# Patient Record
Sex: Female | Born: 1945 | Race: White | Hispanic: No | Marital: Married | Smoking: Never smoker
Health system: Southern US, Community
[De-identification: ages and names within clinical notes are randomized; demographics above are authoritative.]

## PROBLEM LIST (undated history)

## (undated) DIAGNOSIS — F1190 Opioid use, unspecified, uncomplicated: Secondary | ICD-10-CM

## (undated) DIAGNOSIS — E119 Type 2 diabetes mellitus without complications: Secondary | ICD-10-CM

## (undated) DIAGNOSIS — R413 Other amnesia: Secondary | ICD-10-CM

## (undated) DIAGNOSIS — I2510 Atherosclerotic heart disease of native coronary artery without angina pectoris: Secondary | ICD-10-CM

## (undated) DIAGNOSIS — I480 Paroxysmal atrial fibrillation: Secondary | ICD-10-CM

## (undated) DIAGNOSIS — I517 Cardiomegaly: Secondary | ICD-10-CM

## (undated) DIAGNOSIS — G4700 Insomnia, unspecified: Secondary | ICD-10-CM

## (undated) DIAGNOSIS — M151 Heberden's nodes (with arthropathy): Secondary | ICD-10-CM

## (undated) DIAGNOSIS — I700 Atherosclerosis of aorta: Secondary | ICD-10-CM

## (undated) DIAGNOSIS — K429 Umbilical hernia without obstruction or gangrene: Secondary | ICD-10-CM

## (undated) DIAGNOSIS — F329 Major depressive disorder, single episode, unspecified: Secondary | ICD-10-CM

## (undated) DIAGNOSIS — Z79631 Long term (current) use of antimetabolite agent: Secondary | ICD-10-CM

## (undated) DIAGNOSIS — N281 Cyst of kidney, acquired: Secondary | ICD-10-CM

## (undated) DIAGNOSIS — F32A Depression, unspecified: Secondary | ICD-10-CM

## (undated) DIAGNOSIS — M069 Rheumatoid arthritis, unspecified: Secondary | ICD-10-CM

## (undated) DIAGNOSIS — R32 Unspecified urinary incontinence: Secondary | ICD-10-CM

## (undated) DIAGNOSIS — Z9841 Cataract extraction status, right eye: Secondary | ICD-10-CM

## (undated) DIAGNOSIS — I10 Essential (primary) hypertension: Secondary | ICD-10-CM

## (undated) DIAGNOSIS — T7840XA Allergy, unspecified, initial encounter: Secondary | ICD-10-CM

## (undated) DIAGNOSIS — F325 Major depressive disorder, single episode, in full remission: Secondary | ICD-10-CM

## (undated) DIAGNOSIS — E039 Hypothyroidism, unspecified: Secondary | ICD-10-CM

## (undated) DIAGNOSIS — M48062 Spinal stenosis, lumbar region with neurogenic claudication: Secondary | ICD-10-CM

## (undated) DIAGNOSIS — A4902 Methicillin resistant Staphylococcus aureus infection, unspecified site: Secondary | ICD-10-CM

## (undated) DIAGNOSIS — Z79620 Long term (current) use of immunosuppressive biologic: Secondary | ICD-10-CM

## (undated) DIAGNOSIS — M51369 Other intervertebral disc degeneration, lumbar region without mention of lumbar back pain or lower extremity pain: Secondary | ICD-10-CM

## (undated) DIAGNOSIS — E782 Mixed hyperlipidemia: Secondary | ICD-10-CM

## (undated) DIAGNOSIS — J189 Pneumonia, unspecified organism: Secondary | ICD-10-CM

## (undated) DIAGNOSIS — K219 Gastro-esophageal reflux disease without esophagitis: Secondary | ICD-10-CM

## (undated) DIAGNOSIS — E559 Vitamin D deficiency, unspecified: Secondary | ICD-10-CM

## (undated) DIAGNOSIS — N183 Chronic kidney disease, stage 3 unspecified: Secondary | ICD-10-CM

## (undated) DIAGNOSIS — G4733 Obstructive sleep apnea (adult) (pediatric): Secondary | ICD-10-CM

## (undated) DIAGNOSIS — M1990 Unspecified osteoarthritis, unspecified site: Secondary | ICD-10-CM

## (undated) DIAGNOSIS — F419 Anxiety disorder, unspecified: Secondary | ICD-10-CM

## (undated) DIAGNOSIS — I272 Pulmonary hypertension, unspecified: Secondary | ICD-10-CM

## (undated) DIAGNOSIS — Z79899 Other long term (current) drug therapy: Secondary | ICD-10-CM

## (undated) DIAGNOSIS — I5020 Unspecified systolic (congestive) heart failure: Secondary | ICD-10-CM

## (undated) DIAGNOSIS — J45909 Unspecified asthma, uncomplicated: Secondary | ICD-10-CM

## (undated) DIAGNOSIS — Z7982 Long term (current) use of aspirin: Secondary | ICD-10-CM

## (undated) DIAGNOSIS — N189 Chronic kidney disease, unspecified: Secondary | ICD-10-CM

## (undated) DIAGNOSIS — E079 Disorder of thyroid, unspecified: Secondary | ICD-10-CM

## (undated) DIAGNOSIS — G4730 Sleep apnea, unspecified: Secondary | ICD-10-CM

## (undated) DIAGNOSIS — R569 Unspecified convulsions: Secondary | ICD-10-CM

## (undated) HISTORY — PX: HIP SURGERY: SHX245

## (undated) HISTORY — PX: ABDOMINAL HYSTERECTOMY: SHX81

## (undated) HISTORY — DX: Unspecified osteoarthritis, unspecified site: M19.90

## (undated) HISTORY — PX: CATARACT EXTRACTION W/ INTRAOCULAR LENS  IMPLANT, BILATERAL: SHX1307

## (undated) HISTORY — DX: Rheumatoid arthritis, unspecified: M06.9

## (undated) HISTORY — PX: DIAGNOSTIC LAPAROSCOPY: SUR761

## (undated) HISTORY — DX: Spinal stenosis, lumbar region with neurogenic claudication: M48.062

## (undated) HISTORY — DX: Sleep apnea, unspecified: G47.30

## (undated) HISTORY — DX: Other amnesia: R41.3

## (undated) HISTORY — DX: Major depressive disorder, single episode, in full remission: F32.5

## (undated) HISTORY — DX: Anxiety disorder, unspecified: F41.9

## (undated) HISTORY — DX: Unspecified convulsions: R56.9

## (undated) HISTORY — PX: HIP FRACTURE SURGERY: SHX118

## (undated) HISTORY — DX: Unspecified asthma, uncomplicated: J45.909

## (undated) HISTORY — DX: Allergy, unspecified, initial encounter: T78.40XA

## (undated) HISTORY — PX: REPLACEMENT TOTAL KNEE: SUR1224

## (undated) HISTORY — DX: Chronic kidney disease, unspecified: N18.9

## (undated) HISTORY — DX: Disorder of thyroid, unspecified: E07.9

## (undated) HISTORY — PX: TOTAL KNEE ARTHROPLASTY: SHX125

## (undated) HISTORY — DX: Major depressive disorder, single episode, unspecified: F32.9

## (undated) HISTORY — DX: Depression, unspecified: F32.A

---

## 2011-06-23 ENCOUNTER — Ambulatory Visit: Payer: Self-pay | Admitting: Family Medicine

## 2011-06-23 IMAGING — MG MM CAD SCREENING MAMMO
1 series · 4 of 4 positions shown · non-contrast
Comparison: none

REASON FOR EXAM: SCR MAMMO NO ORDER
COMMENTS:

[Series 8302: R CC · right · 4 of 4 slices shown]
[im 1/4]
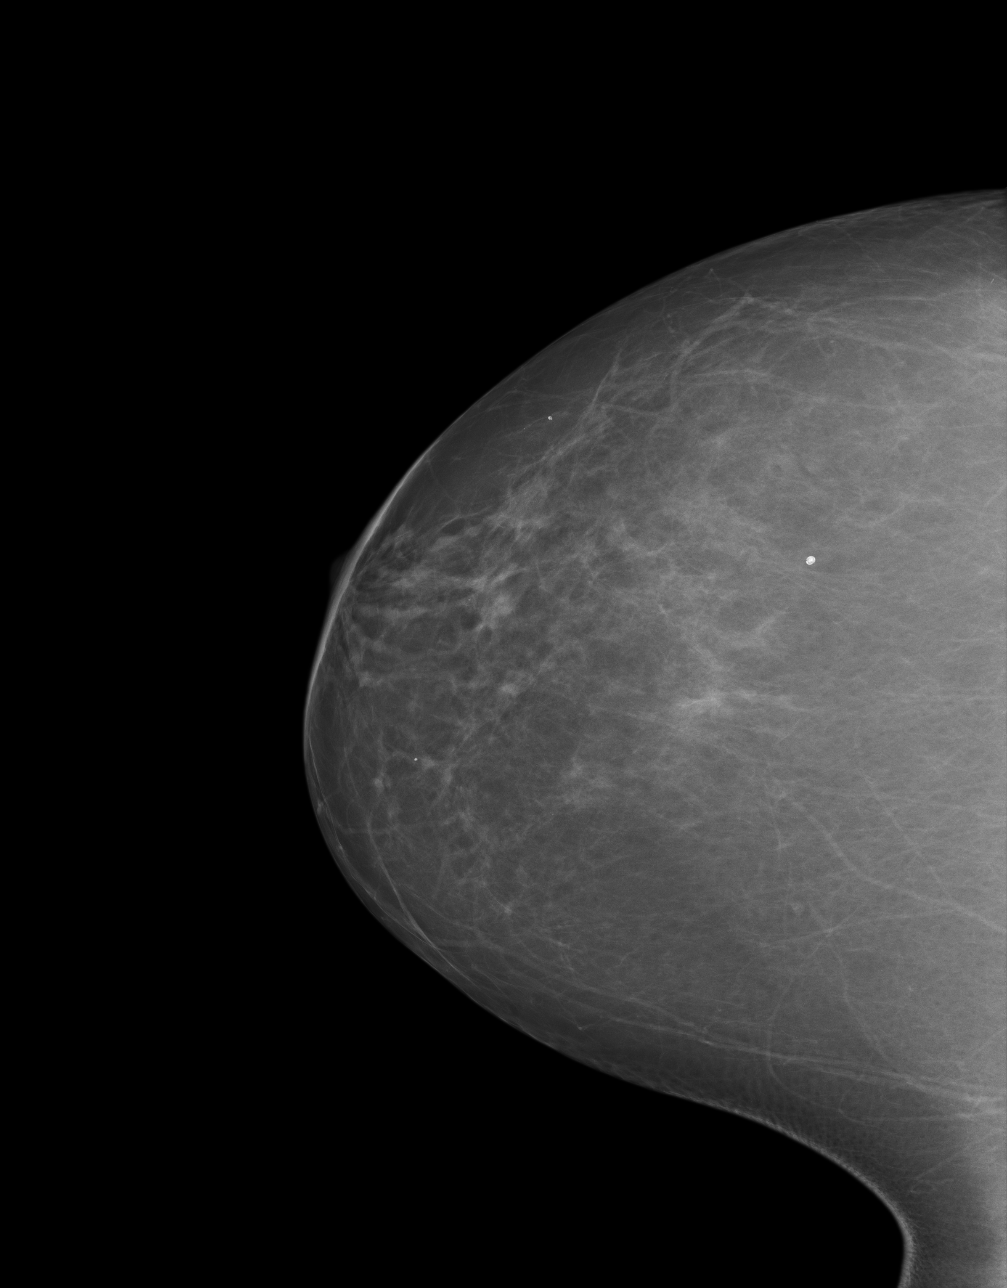
[im 2/4]
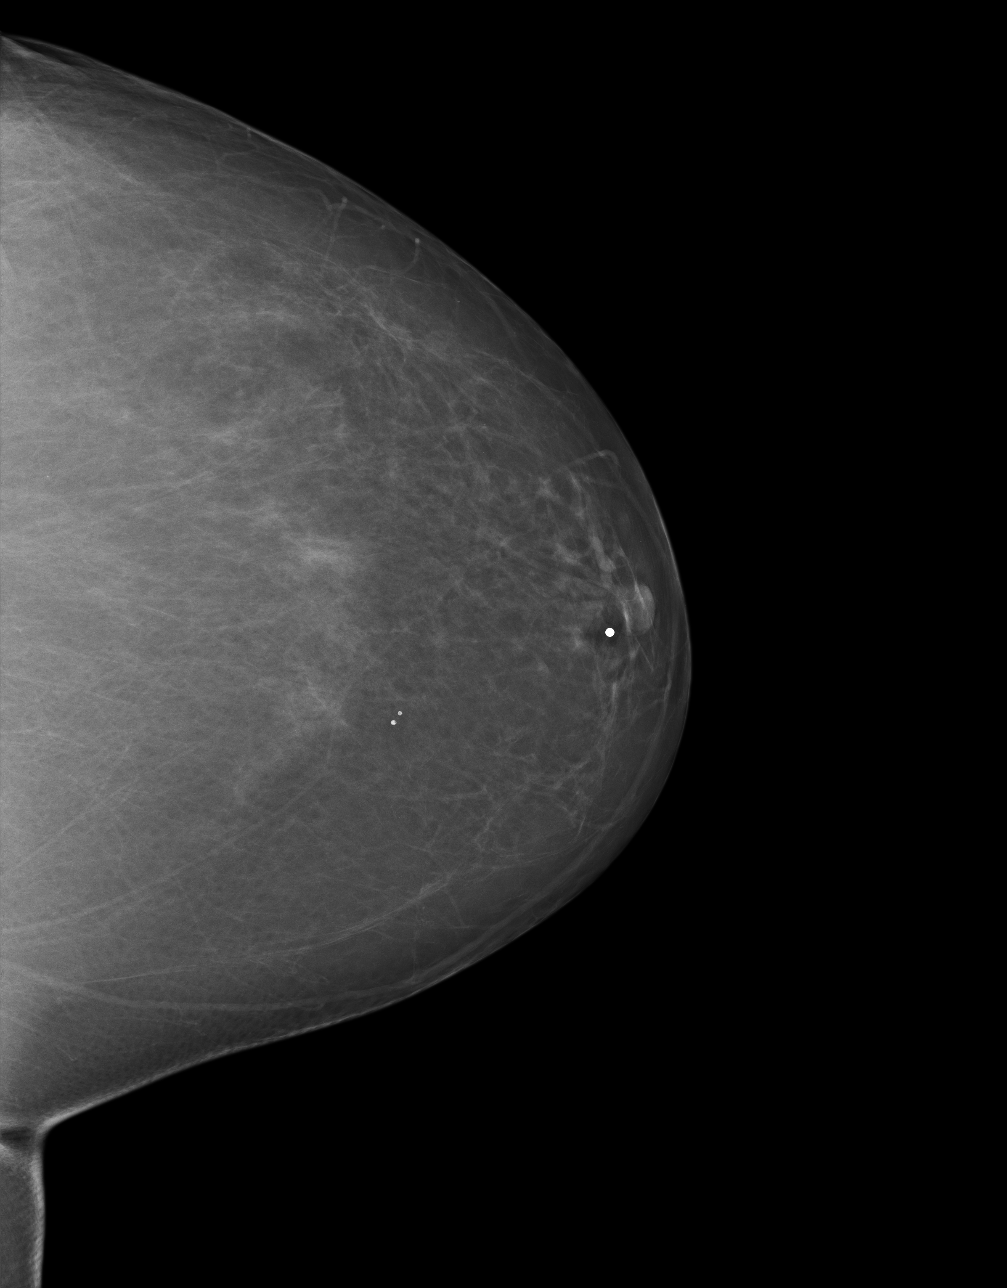
[im 3/4]
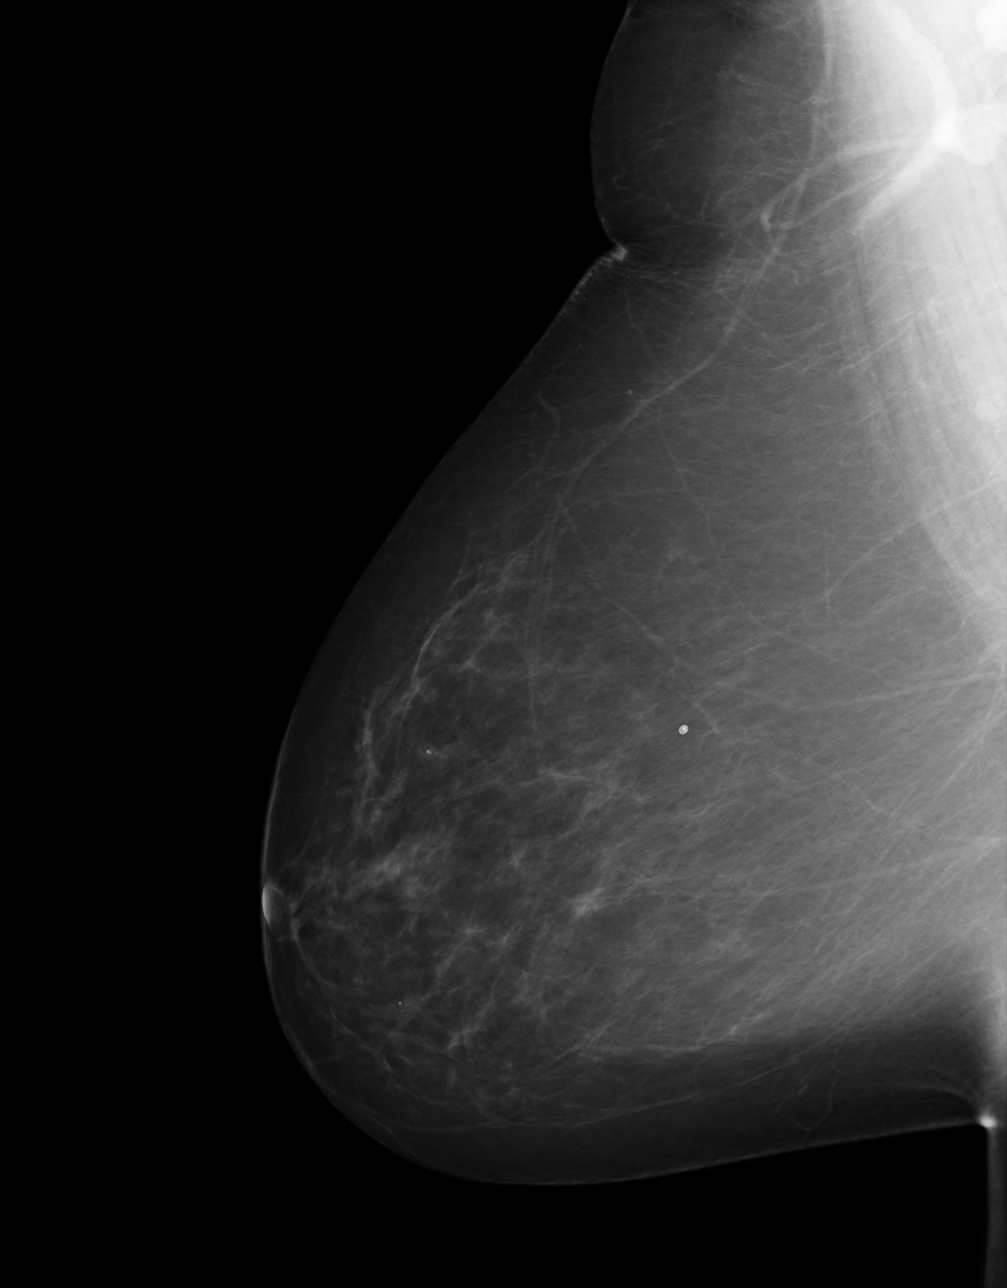
[im 4/4]
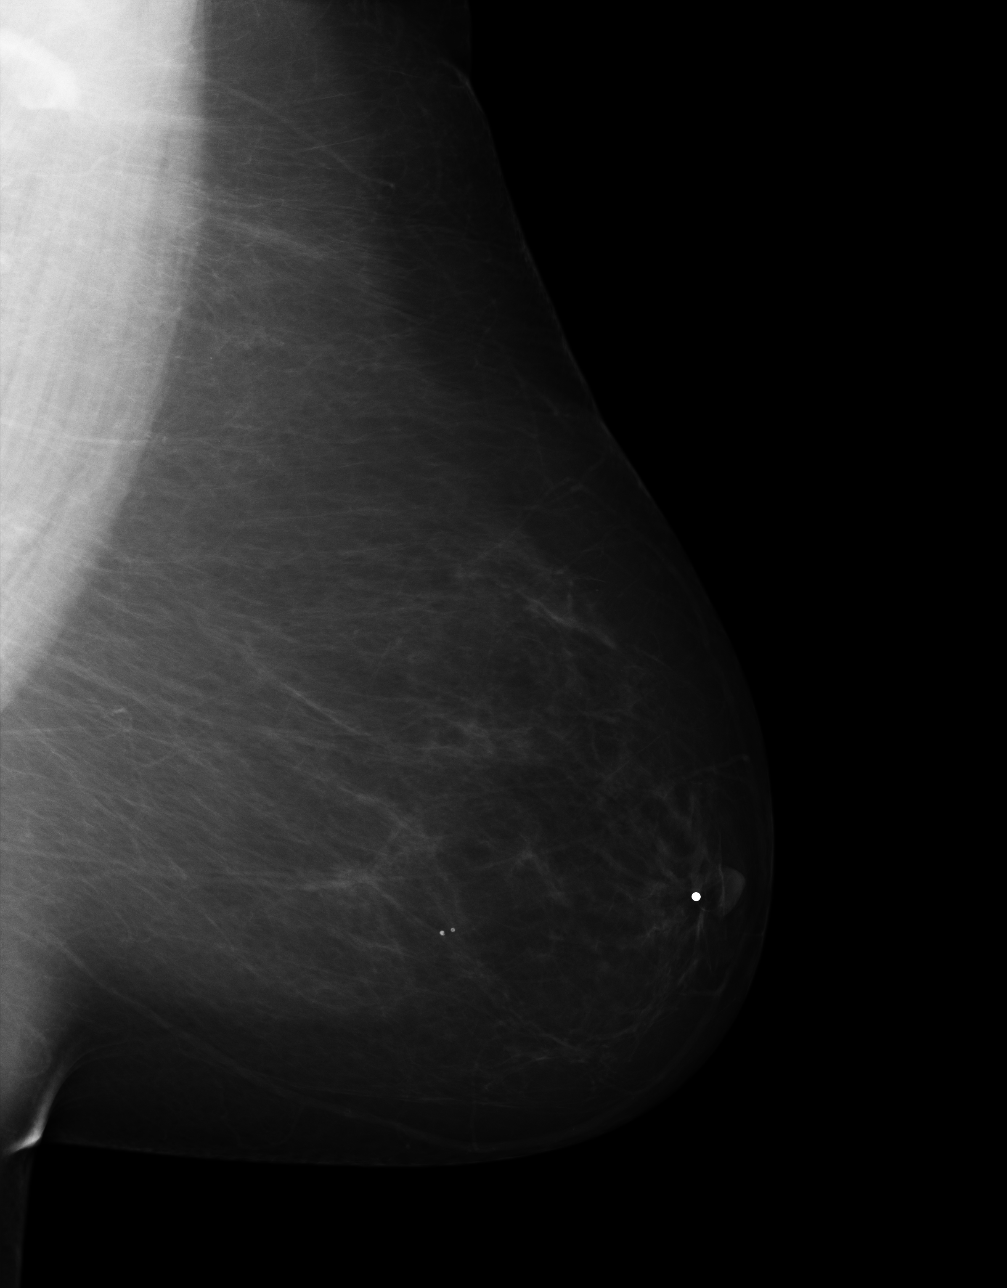

[4 of 4 positions shown; findings below may reference images not displayed]

PROCEDURE:     MAM - MAM DGTL SCRN MAM NO ORDER W/CAD  - [DATE] [DATE]

RESULT:     There is no known family history of breast cancer. The patient
reports no previous breast surgery. Comparison is made to images from Breast
[HOSPITAL] of [REDACTED] in [facility] dated [DATE], as
well as images dated [DATE] and [DATE]. The breasts exhibit a mild
parenchymal density with predominant fatty replacement. There is no
developing parenchymal density, dominant mass or malignant calcification. No
architectural distortion is seen.
IMPRESSION: 1. Stable, benign appearing bilateral mammogram.

BI-RADS: Category 2 - Benign Finding.

Please continue to encourage annual mammographic follow-up.

A NEGATIVE MAMMOGRAM REPORT DOES NOT PRECLUDE BIOPSY OR OTHER EVALUATION OF
A CLINICALLY PALPABLE OR OTHERWISE SUSPICIOUS MASS OR LESION. BREAST CANCER
MAY NOT BE DETECTED BY MAMMOGRAPHY IN UP TO 10% OF CASES.

[REDACTED]

## 2011-12-13 ENCOUNTER — Ambulatory Visit: Payer: Self-pay | Admitting: Rheumatology

## 2012-04-04 ENCOUNTER — Other Ambulatory Visit: Payer: Self-pay | Admitting: Pain Medicine

## 2012-04-04 ENCOUNTER — Ambulatory Visit: Payer: Self-pay | Admitting: Pain Medicine

## 2012-04-04 LAB — SEDIMENTATION RATE: Erythrocyte Sed Rate: 10 mm/hr (ref 0–30)

## 2012-04-10 ENCOUNTER — Ambulatory Visit: Payer: Self-pay | Admitting: Pain Medicine

## 2012-04-30 ENCOUNTER — Ambulatory Visit: Payer: Self-pay | Admitting: Pain Medicine

## 2012-05-24 ENCOUNTER — Ambulatory Visit: Payer: Self-pay | Admitting: Pain Medicine

## 2012-06-18 ENCOUNTER — Ambulatory Visit: Payer: Self-pay | Admitting: Pain Medicine

## 2012-06-19 ENCOUNTER — Other Ambulatory Visit: Payer: Self-pay | Admitting: Pain Medicine

## 2012-06-19 LAB — MAGNESIUM: Magnesium: 2 mg/dL

## 2012-07-03 ENCOUNTER — Ambulatory Visit: Payer: Self-pay | Admitting: Pain Medicine

## 2012-07-18 ENCOUNTER — Ambulatory Visit: Payer: Self-pay | Admitting: Pain Medicine

## 2012-07-31 ENCOUNTER — Ambulatory Visit: Payer: Self-pay | Admitting: Pain Medicine

## 2012-08-22 ENCOUNTER — Ambulatory Visit: Payer: Self-pay | Admitting: Pain Medicine

## 2012-08-28 ENCOUNTER — Ambulatory Visit: Payer: Self-pay | Admitting: Pain Medicine

## 2012-10-10 ENCOUNTER — Ambulatory Visit: Payer: Self-pay | Admitting: Pain Medicine

## 2012-10-23 ENCOUNTER — Ambulatory Visit: Payer: Self-pay | Admitting: Pain Medicine

## 2012-11-10 DIAGNOSIS — I5020 Unspecified systolic (congestive) heart failure: Secondary | ICD-10-CM | POA: Insufficient documentation

## 2012-11-20 ENCOUNTER — Ambulatory Visit: Payer: Self-pay | Admitting: Family Medicine

## 2012-11-20 IMAGING — MG MM DIGITAL SCREENING BILAT W/ CAD
1 series · 8 of 8 positions shown · non-contrast
Comparison: Previous exam(s).

CLINICAL DATA: Screening.

EXAM:
DIGITAL SCREENING BILATERAL MAMMOGRAM WITH CAD

[R CC · right · 8 of 8 slices shown]
[im 1/8]
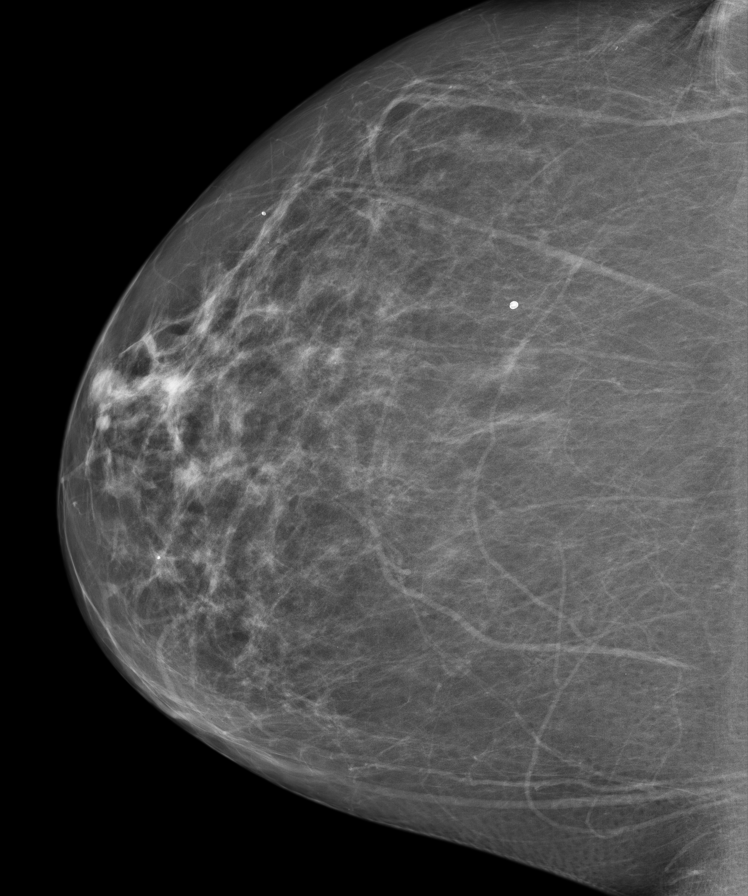
[im 2/8]
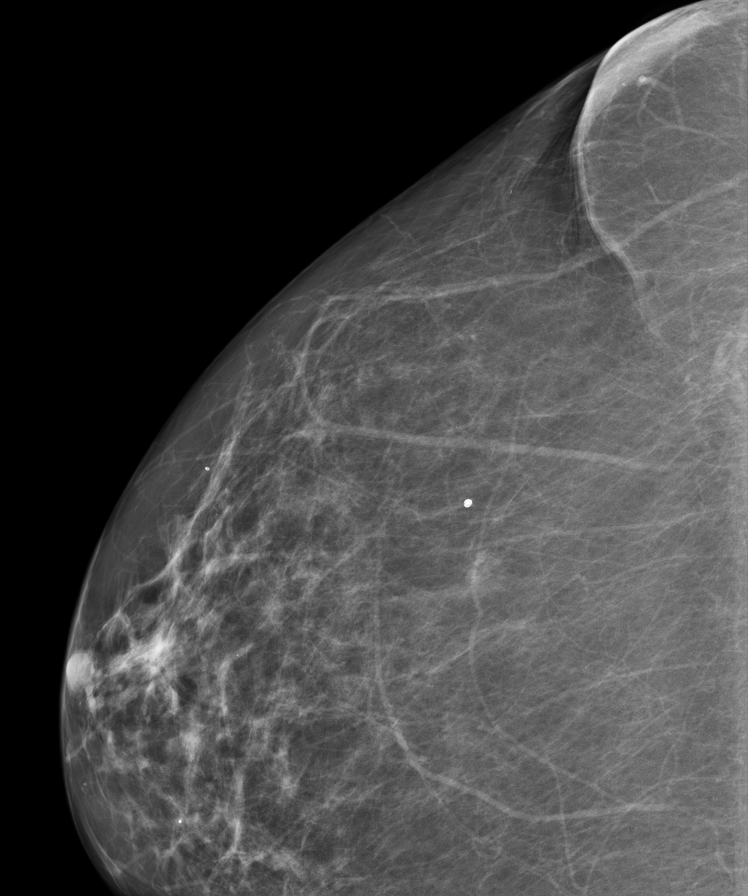
[im 3/8]
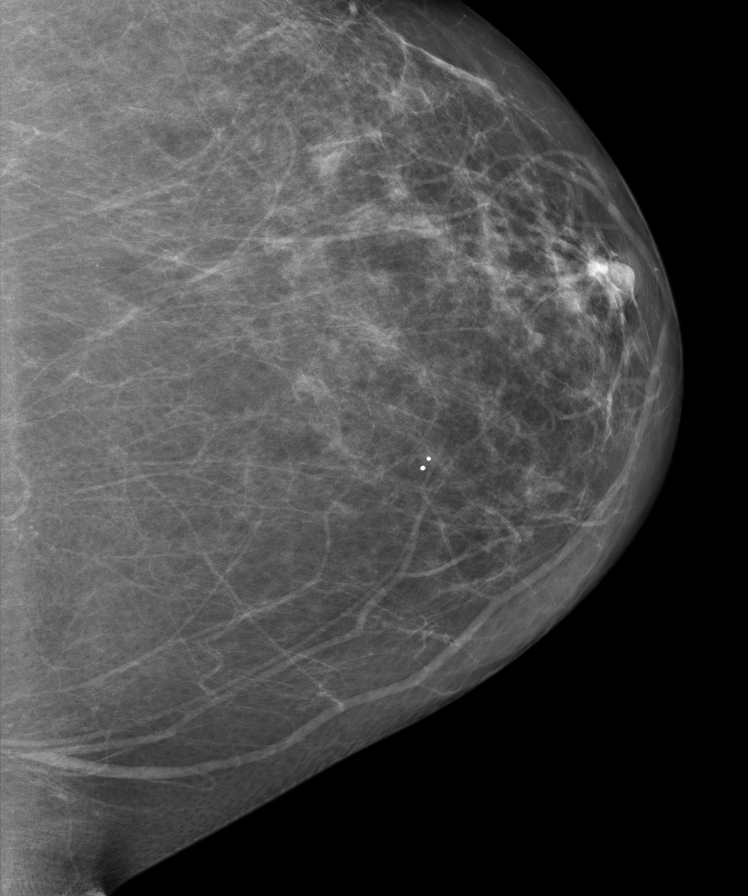
[im 4/8]
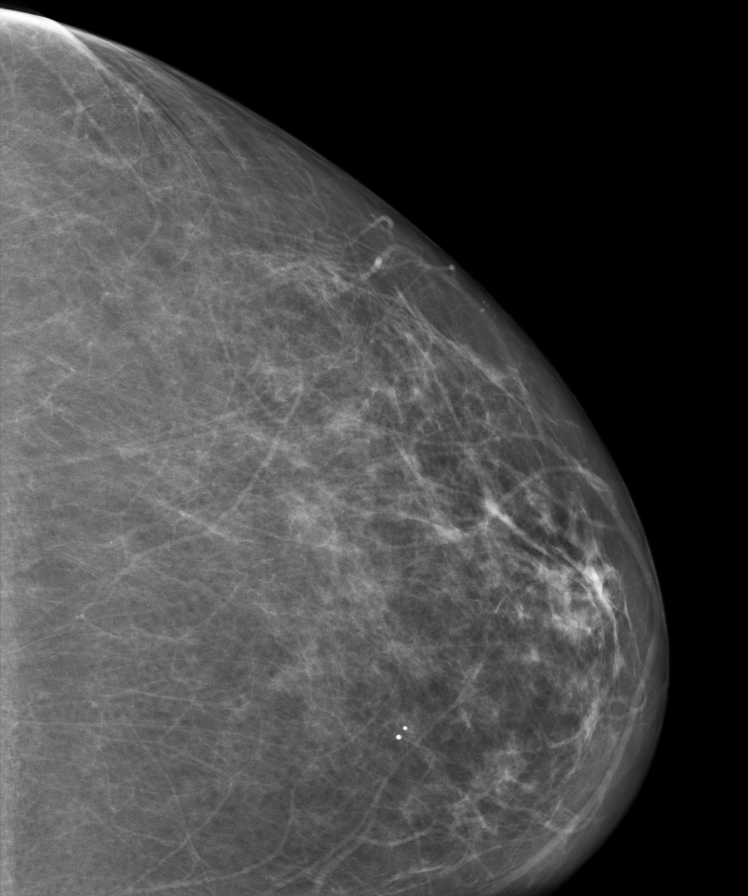
[im 5/8]
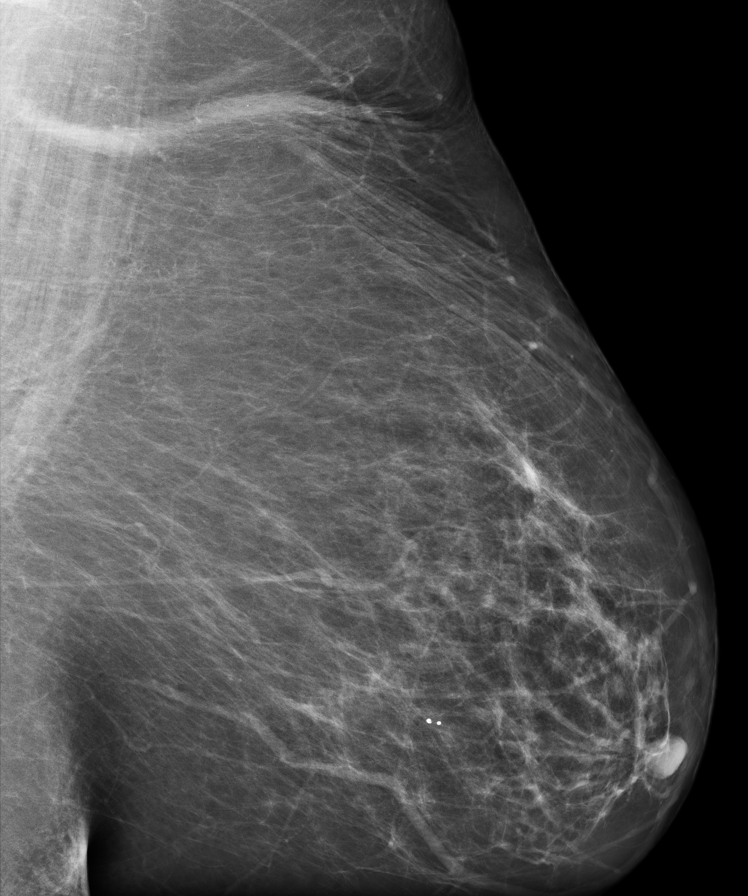
[im 6/8]
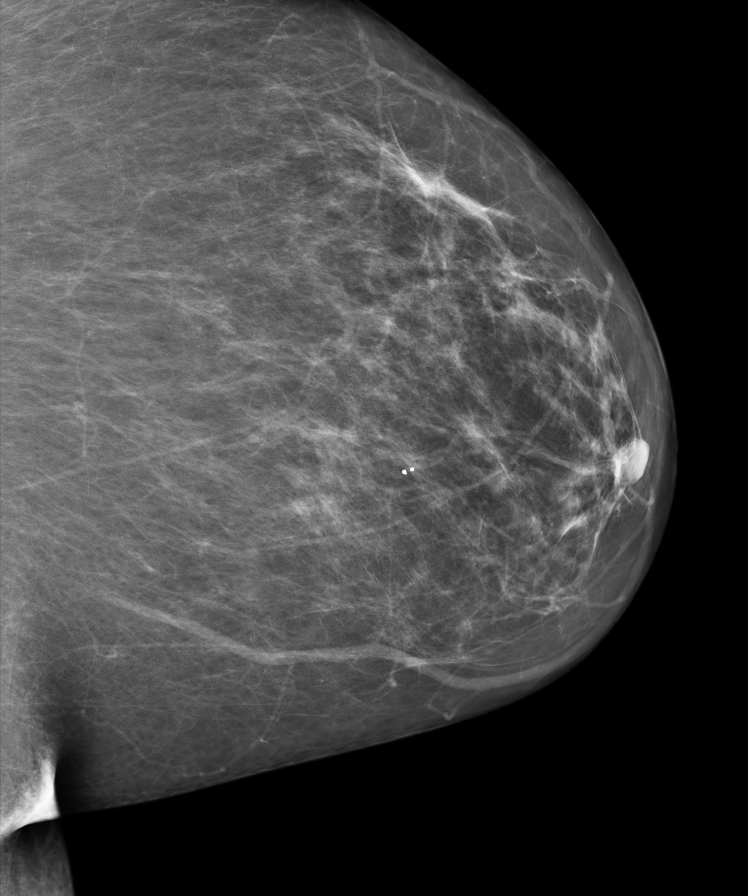
[im 7/8]
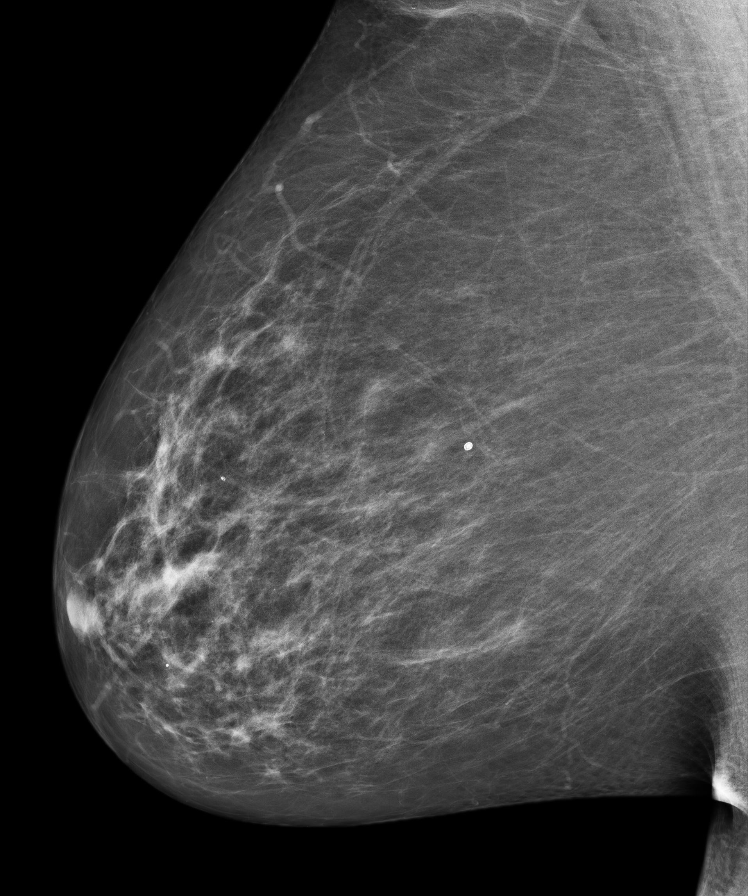
[im 8/8]
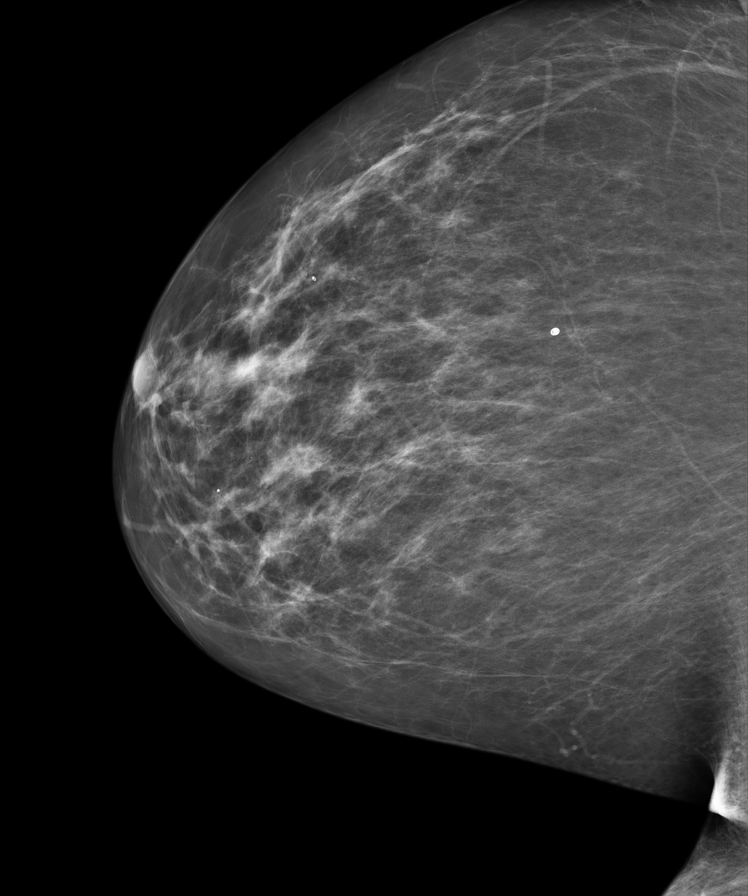

[8 of 8 positions shown; findings below may reference images not displayed]

ACR Breast Density Category b: There are scattered areas of
fibroglandular density.
FINDINGS: There are no findings suspicious for malignancy. Images were
processed with CAD.
IMPRESSION: No mammographic evidence of malignancy. A result letter of this
screening mammogram will be mailed directly to the patient.

RECOMMENDATION:
Screening mammogram in one year. (Code:[HN])

BI-RADS CATEGORY  1: Negative

## 2012-11-28 ENCOUNTER — Ambulatory Visit: Payer: Self-pay | Admitting: Pain Medicine

## 2013-01-17 DIAGNOSIS — N183 Chronic kidney disease, stage 3 unspecified: Secondary | ICD-10-CM | POA: Insufficient documentation

## 2013-01-17 DIAGNOSIS — N1831 Chronic kidney disease, stage 3a: Secondary | ICD-10-CM | POA: Insufficient documentation

## 2013-01-18 DIAGNOSIS — E785 Hyperlipidemia, unspecified: Secondary | ICD-10-CM | POA: Insufficient documentation

## 2013-01-18 DIAGNOSIS — M0600 Rheumatoid arthritis without rheumatoid factor, unspecified site: Secondary | ICD-10-CM | POA: Insufficient documentation

## 2013-01-18 DIAGNOSIS — E039 Hypothyroidism, unspecified: Secondary | ICD-10-CM | POA: Insufficient documentation

## 2013-01-18 DIAGNOSIS — E7800 Pure hypercholesterolemia, unspecified: Secondary | ICD-10-CM | POA: Insufficient documentation

## 2013-01-18 DIAGNOSIS — K219 Gastro-esophageal reflux disease without esophagitis: Secondary | ICD-10-CM | POA: Insufficient documentation

## 2013-03-15 ENCOUNTER — Ambulatory Visit: Payer: Self-pay | Admitting: Pain Medicine

## 2013-03-25 ENCOUNTER — Encounter: Payer: Self-pay | Admitting: Internal Medicine

## 2013-04-10 ENCOUNTER — Encounter: Payer: Self-pay | Admitting: Internal Medicine

## 2013-05-10 ENCOUNTER — Encounter: Payer: Self-pay | Admitting: Internal Medicine

## 2013-06-10 ENCOUNTER — Encounter: Payer: Self-pay | Admitting: Internal Medicine

## 2013-06-12 ENCOUNTER — Emergency Department: Payer: Self-pay | Admitting: Emergency Medicine

## 2013-06-12 LAB — CBC
HCT: 41.8 % (ref 35.0–47.0)
HGB: 13.6 g/dL (ref 12.0–16.0)
MCH: 27.7 pg (ref 26.0–34.0)
MCHC: 32.5 g/dL (ref 32.0–36.0)
MCV: 85 fL (ref 80–100)
Platelet: 251 10*3/uL (ref 150–440)
RBC: 4.9 10*6/uL (ref 3.80–5.20)
RDW: 16.3 % — ABNORMAL HIGH (ref 11.5–14.5)
WBC: 5.4 10*3/uL (ref 3.6–11.0)

## 2013-06-12 LAB — BASIC METABOLIC PANEL
Anion Gap: 4 — ABNORMAL LOW (ref 7–16)
BUN: 20 mg/dL — ABNORMAL HIGH (ref 7–18)
Calcium, Total: 8.6 mg/dL (ref 8.5–10.1)
Chloride: 102 mmol/L (ref 98–107)
Co2: 32 mmol/L (ref 21–32)
Creatinine: 1.42 mg/dL — ABNORMAL HIGH (ref 0.60–1.30)
EGFR (African American): 44 — ABNORMAL LOW
EGFR (Non-African Amer.): 38 — ABNORMAL LOW
Glucose: 109 mg/dL — ABNORMAL HIGH (ref 65–99)
Osmolality: 279 (ref 275–301)
Potassium: 4.1 mmol/L (ref 3.5–5.1)
Sodium: 138 mmol/L (ref 136–145)

## 2013-06-12 LAB — TROPONIN I: Troponin-I: <0.02 ng/mL

## 2013-06-14 ENCOUNTER — Ambulatory Visit: Payer: Self-pay | Admitting: Pain Medicine

## 2013-07-09 DIAGNOSIS — G4701 Insomnia due to medical condition: Secondary | ICD-10-CM | POA: Insufficient documentation

## 2013-07-09 DIAGNOSIS — F419 Anxiety disorder, unspecified: Secondary | ICD-10-CM | POA: Insufficient documentation

## 2013-07-09 DIAGNOSIS — F411 Generalized anxiety disorder: Secondary | ICD-10-CM | POA: Insufficient documentation

## 2013-07-09 DIAGNOSIS — G8929 Other chronic pain: Secondary | ICD-10-CM | POA: Insufficient documentation

## 2013-07-10 ENCOUNTER — Encounter: Payer: Self-pay | Admitting: Internal Medicine

## 2013-10-07 ENCOUNTER — Ambulatory Visit: Payer: Self-pay | Admitting: Pain Medicine

## 2013-10-08 DIAGNOSIS — M1990 Unspecified osteoarthritis, unspecified site: Secondary | ICD-10-CM

## 2013-10-08 HISTORY — DX: Unspecified osteoarthritis, unspecified site: M19.90

## 2013-10-14 DIAGNOSIS — I10 Essential (primary) hypertension: Secondary | ICD-10-CM | POA: Insufficient documentation

## 2013-10-14 DIAGNOSIS — F329 Major depressive disorder, single episode, unspecified: Secondary | ICD-10-CM | POA: Insufficient documentation

## 2013-10-14 DIAGNOSIS — F330 Major depressive disorder, recurrent, mild: Secondary | ICD-10-CM | POA: Insufficient documentation

## 2013-10-14 DIAGNOSIS — F32A Depression, unspecified: Secondary | ICD-10-CM | POA: Insufficient documentation

## 2013-11-22 ENCOUNTER — Ambulatory Visit: Payer: Self-pay | Admitting: Internal Medicine

## 2013-11-22 IMAGING — MG MM DIGITAL SCREENING BILAT W/ CAD
6 series · 6 of 6 positions shown · non-contrast
Comparison: Previous exam(s).

CLINICAL DATA: Screening.

EXAM:
DIGITAL SCREENING BILATERAL MAMMOGRAM WITH CAD

[R CC (1 of 2)]
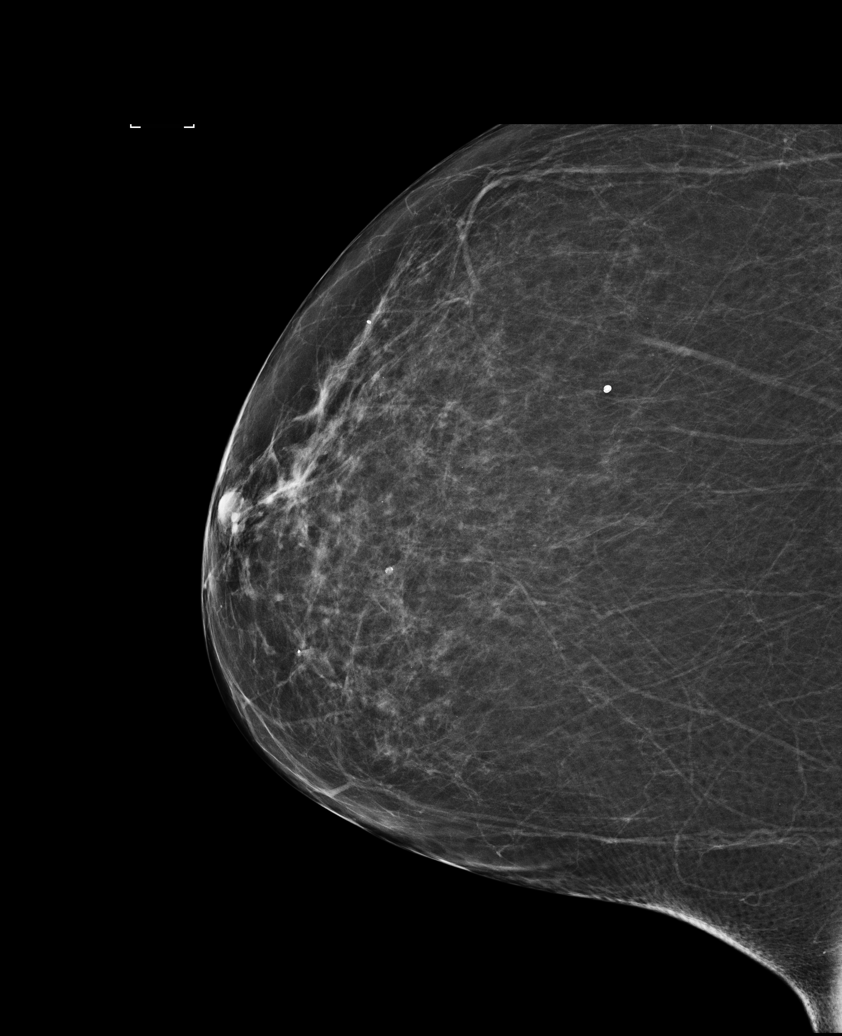

[L CC (1 of 2)]
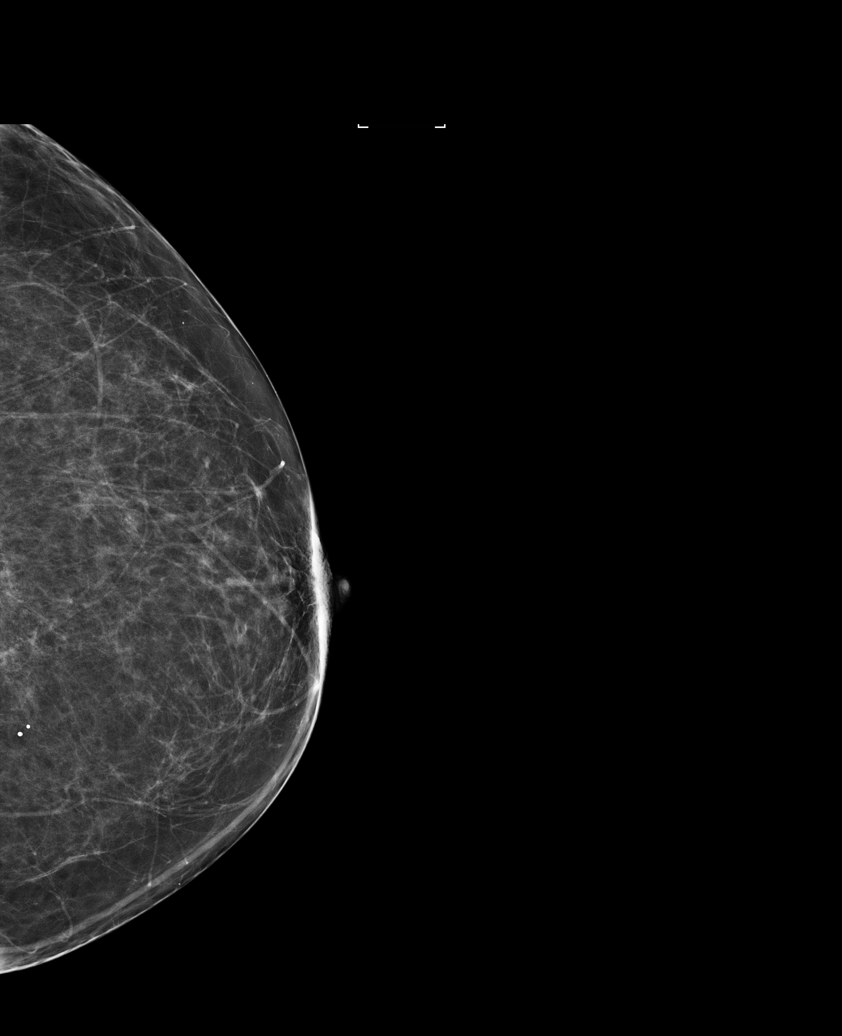

[L MLO]
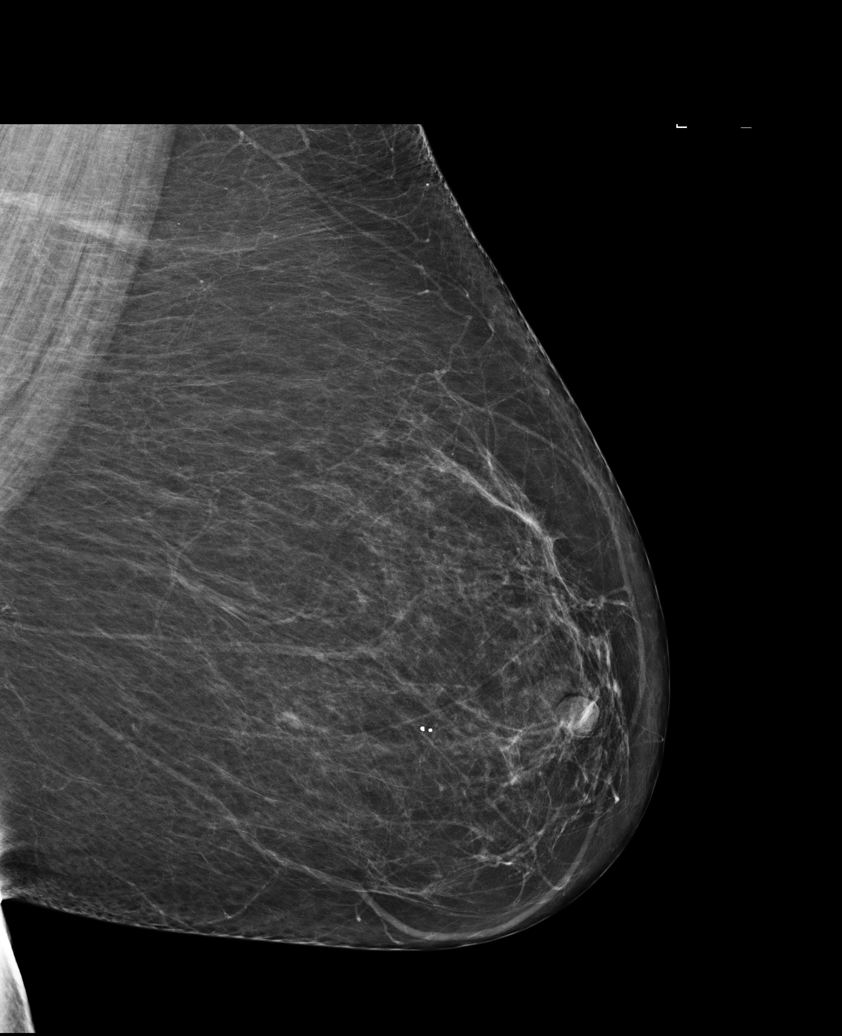

[L CC (2 of 2)]
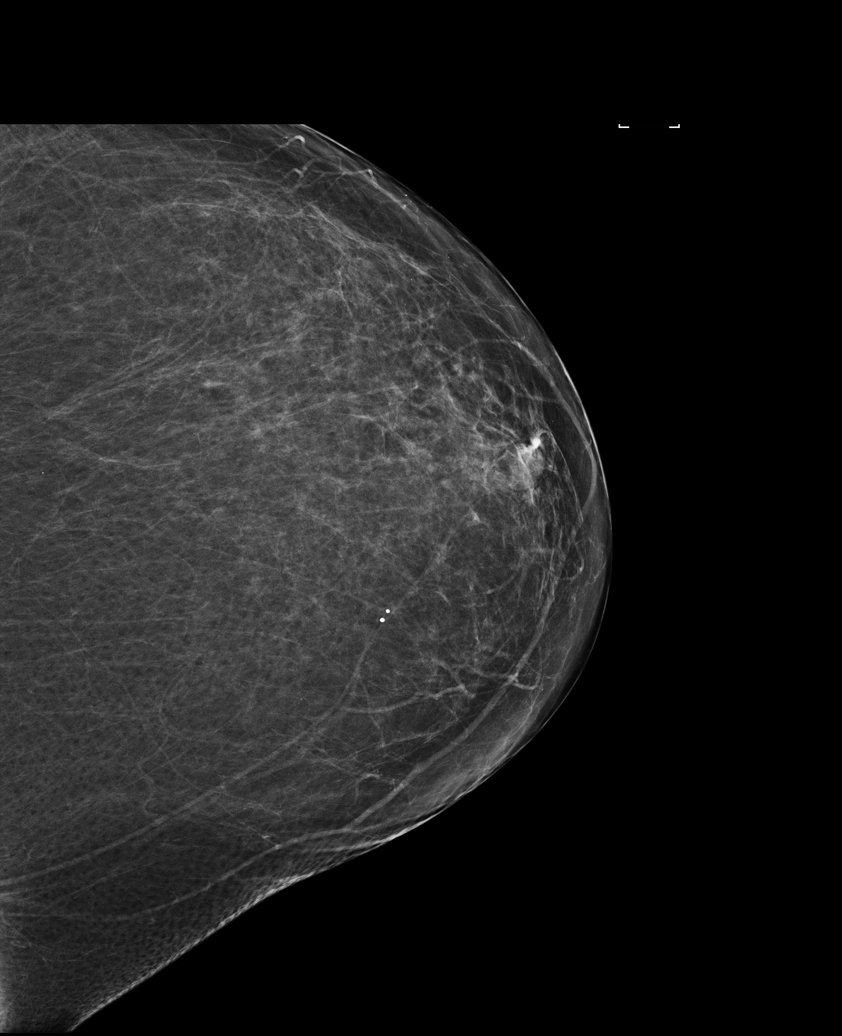

[R MLO]
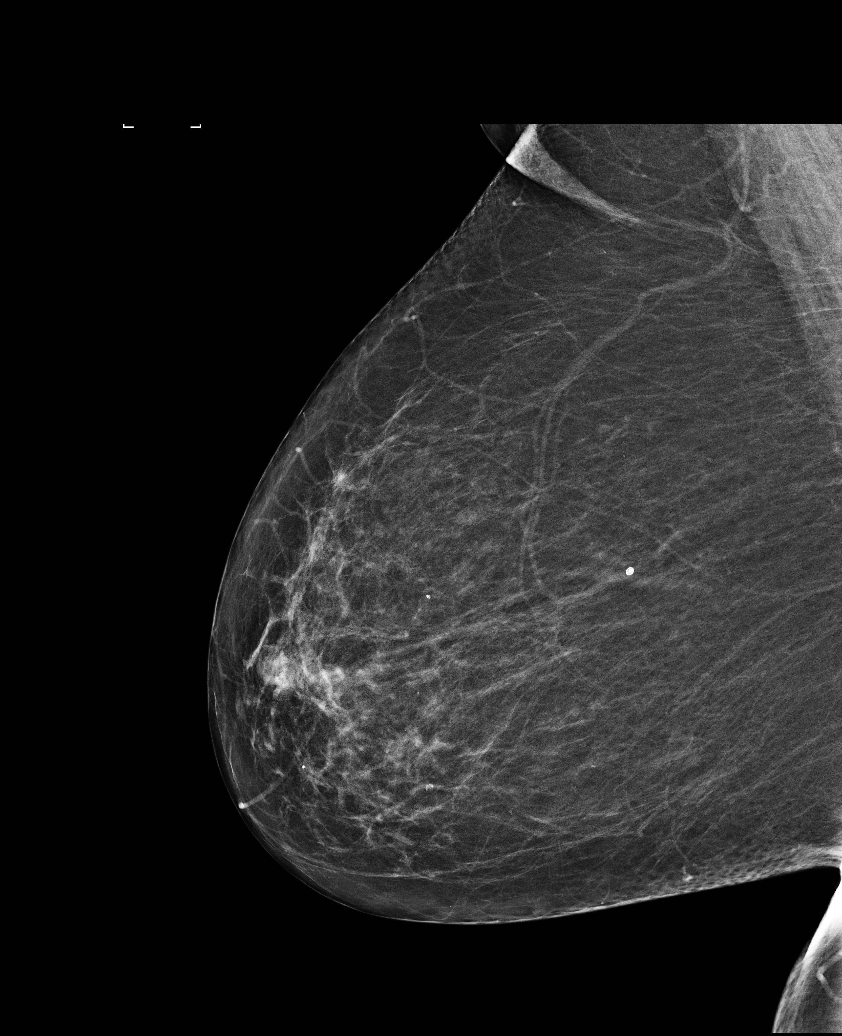

[R CC (2 of 2)]
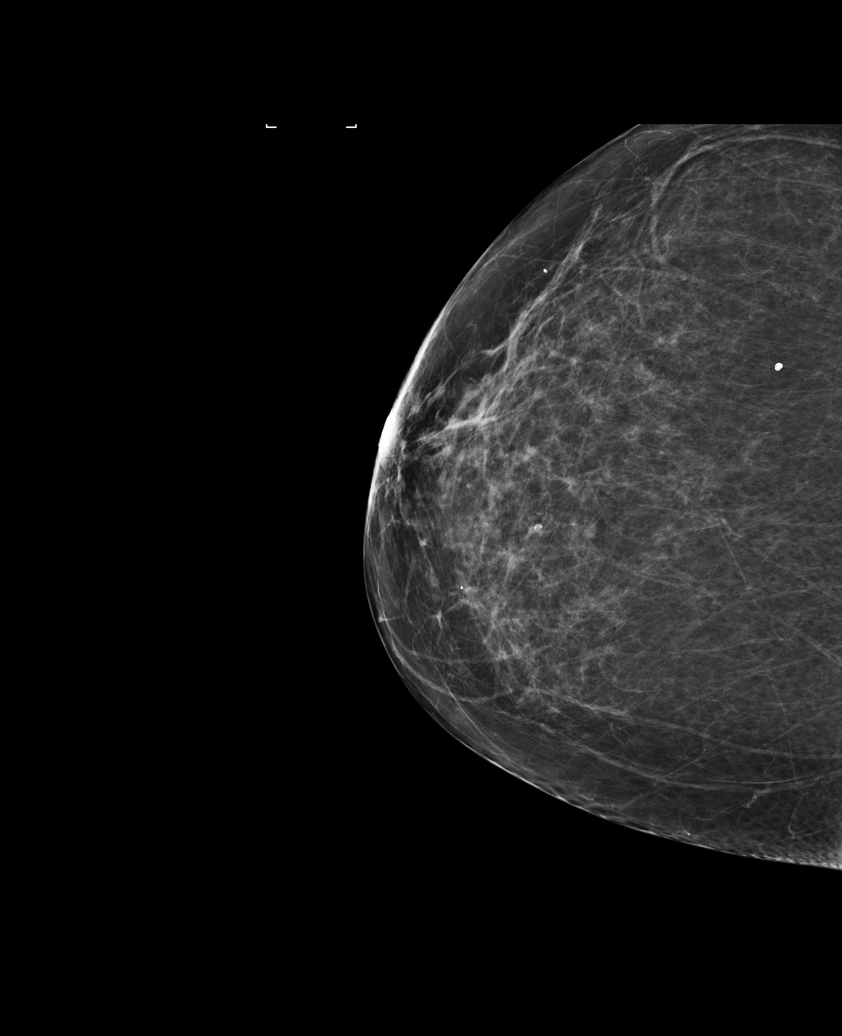

[6 of 6 positions shown; findings below may reference images not displayed]

ACR Breast Density Category b: There are scattered areas of
fibroglandular density.
FINDINGS: There are no findings suspicious for malignancy. Images were
processed with CAD.
IMPRESSION: No mammographic evidence of malignancy. A result letter of this
screening mammogram will be mailed directly to the patient.

RECOMMENDATION:
Screening mammogram in one year. (Code:[US])

BI-RADS CATEGORY  1: Negative.

## 2014-03-19 DIAGNOSIS — R413 Other amnesia: Secondary | ICD-10-CM

## 2014-03-19 DIAGNOSIS — E119 Type 2 diabetes mellitus without complications: Secondary | ICD-10-CM | POA: Insufficient documentation

## 2014-03-19 HISTORY — DX: Other amnesia: R41.3

## 2014-09-04 ENCOUNTER — Ambulatory Visit (INDEPENDENT_AMBULATORY_CARE_PROVIDER_SITE_OTHER): Payer: Medicare Other | Admitting: Licensed Clinical Social Worker

## 2014-09-04 DIAGNOSIS — F32A Depression, unspecified: Secondary | ICD-10-CM

## 2014-09-04 DIAGNOSIS — F0631 Mood disorder due to known physiological condition with depressive features: Secondary | ICD-10-CM

## 2014-09-04 DIAGNOSIS — F329 Major depressive disorder, single episode, unspecified: Secondary | ICD-10-CM | POA: Diagnosis not present

## 2014-09-04 NOTE — Progress Notes (Signed)
Patient ID: Laura [name], female   DOB: 03-12-1945, 69 y.o.   MRN: [id number] Patient:   Laura [name]   DOB:   July 12, 1945  MR Number:  [id number]  Location:  [facility] [address] [place] [address] Dept: [phone]           Date of Service:   09/04/2014  Start Time:   210p End Time:   320p  Provider/Observer:  Marinda Elk Counselor       Billing Code/Service: [phone]  Behavioral Observation: Tammra Pressman  presents as a 69 y.o.-year-old Caucasian Female who appeared her stated age. her dress was Appropriate and she was Casual and her manners were Appropriate to the situation.  There were physical disabilities noted.  she displayed an appropriate level of cooperation and motivation.    Interactions:    Active   Attention:   within normal limits  Memory:   within normal limits  Speech (Volume):  normal  Speech:   normal volume  Thought Process:  Coherent and Relevant  Though Content:  WNL  Orientation:   person, place, time/date and situation  Judgment:   Good  Planning:   Fair  Affect:    Depressed  Mood:    Depressed  Insight:   Fair  Intelligence:   normal  Chief Complaint:     Chief Complaint  Patient presents with  . Depression  . Establish Care    Reason for Service:  "I want to be able to get medication."  Current Symptoms:  On pain management; daughter just moved from [place] to assist with care, was discharged form Dr. Maryruth Bun due to husband's behavior. Ran out of her Lexapro medication.  Has difficluty with sleeping, racing thoughts, currently attending [employer]; lost 15 pounds for the past year.  Fatigue, lack of motivation, limitations, has several medication conditions  Source of Distress:              Moved to [place] 3 years ago, lack of finances.  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  Legal History:  Denies   Research officer, trade union:  Denies    Religious/Spiritual Preferences:  Catholic  Family/Childhood History:                           Born & Raised in [place]; describes her childhood as "good, responsible for sister & worried about father"  Father Alcoholic tried to make him change   Children/Grand-children:    Ravenna Legore 37, Gregory Louis 36/2 Grandchildren 5 &7  Natural/Informal Support:                           sister   Substance Use:  No concerns of substance abuse are reported.    Medical History:  No past medical history on file.        Medication List    Notice  As of 09/04/2014  2:43 PM   You have not been prescribed any medications.     Temazapan 7.5mg  Cymbalta 60mg  po qd Lasix 40mg  Nexium 20mg  Norvasc 5 mg Oxycodone 5 mg 4 daily  Plaquenil 200mg  once daily Cynthroid 225mg  po qd Toprol 50mg  po qd Spironolactone 25mg  daily       Sexual History:   History  Sexual Activity  . Sexual Activity: Not on file     Abuse/Trauma History: Father in law died in her home   Psychiatric History:  Dr. in [place], 4-5 years ago Medication management Dr. for 3-4 months   Strengths:   Love people, love to talk, enjoy life,    Recovery Goals:  "I want to be able to get medication."  Hobbies/Interests:               Going out to eat, watch tv, watch movies, play solitaire, sleeping late   Challenges/Barriers: Using a walker    Family Med/Psych History: No family history on file.  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  Crystallee is currently limited her mobility due to use of a walker.  Dulcy will be best supported with medication management and outpatient therapy to assist with coping skills.  Tomeca does not have a history of suicidal attempts and denies psychosis.   Recommendation/Plan: Writer recommends Outpatient Therapy at least twice monthly to include but not limited to individual, group and or family therapy.  Medication Management is also recommended to assist with her mood.

## 2014-10-07 ENCOUNTER — Encounter: Payer: Self-pay | Admitting: Psychiatry

## 2014-10-07 ENCOUNTER — Ambulatory Visit (INDEPENDENT_AMBULATORY_CARE_PROVIDER_SITE_OTHER): Payer: Medicare Other | Admitting: Psychiatry

## 2014-10-07 VITALS — BP 142/88 | HR 101 | Temp 97.5°F | Ht 64.0 in | Wt 282.6 lb

## 2014-10-07 DIAGNOSIS — R569 Unspecified convulsions: Secondary | ICD-10-CM

## 2014-10-07 DIAGNOSIS — G4700 Insomnia, unspecified: Secondary | ICD-10-CM

## 2014-10-07 DIAGNOSIS — E669 Obesity, unspecified: Secondary | ICD-10-CM | POA: Insufficient documentation

## 2014-10-07 DIAGNOSIS — E668 Other obesity: Secondary | ICD-10-CM | POA: Insufficient documentation

## 2014-10-07 HISTORY — DX: Unspecified convulsions: R56.9

## 2014-10-07 MED ORDER — TEMAZEPAM 22.5 MG PO CAPS: 22.5000 mg | ORAL_CAPSULE | Freq: Every evening | ORAL | Status: DC | PRN

## 2014-10-07 MED ORDER — DULOXETINE HCL 30 MG PO CPEP: ORAL_CAPSULE | ORAL | Status: DC

## 2014-10-07 NOTE — Progress Notes (Signed)
Psychiatric Initial Adult Assessment   Patient Identification: Laura [name] MRN:  [id number] Date of Evaluation:  10/07/2014 Referral Source: LCSW Chief Complaint:  "Insomnia." Patient's name is pronounced Aspay-azu Chief Complaint    Establish Care     Visit Diagnosis:    ICD-9-CM ICD-10-CM   1. Insomnia 780.52 G47.00    Diagnosis:   Patient Active Problem List   Diagnosis Date Noted  . Diabetes mellitus, type 2 [E11.9] 10/07/2014  . Extreme obesity [E66.01] 10/07/2014  . Seizure [R56.9] 10/07/2014  . Gastro-esophageal reflux disease without esophagitis [K21.9] 06/24/2014  . Memory loss, short term [R41.3] 03/19/2014  . Acquired hypothyroidism [E03.9] 01/14/2014  . Clinical depression [F32.9] 10/14/2013  . Essential (primary) hypertension [I10] 10/14/2013  . Arthritis, degenerative [M19.90] 10/08/2013  . Anxiety [F41.9] 07/09/2013  . Cannot sleep [G47.00] 07/09/2013  . Acid reflux [K21.9] 01/18/2013  . Adult hypothyroidism [E03.9] 01/18/2013  . HLD (hyperlipidemia) [E78.5] 01/18/2013  . Rheumatoid arthritis without elevated rheumatoid factor [M06.00] 01/18/2013  . Spinal stenosis [M48.00] 01/18/2013  . Chronic kidney disease (CKD), stage III (moderate) [N18.3] 01/17/2013  . Congestive heart failure with left ventricular systolic dysfunction [I50.20] 83/38/2505   History of Present Illness:  Patient relates that she was first referred to a psychiatrist here [place] 6 months ago. However she states she saw psychiatrist in [place] for over 10 years for insomnia and depression and last saw the psychiatrist 3 years ago when she moved to [place]. She did see Dr. Maryruth Bun here [facility] but indicates that she preferred to see someone else. Notes from social worker indicate that poor had some issue with patient's husband but there are no details as to what that was.  Patient case that she began experiencing depression in her 30s. She states her depressive episodes  might last a week. She states during these episodes her sleep is poor, she blames herself and feels guilty about things she thinks she could've done differently, she has low energy, poor concentration and depressed mood. She states that she believes sometimes her low energy is due to her medical problems such as her RA and fibromyalgia.  She states however that sleep has been a significant problem for her. She states she has sleep apnea. She states she's in on Cymbalta and it was unsure about how that was helping her mood. However she states Lexapro was added to her medications and felt like Lexapro did improve her mood. She is a she's now been off of the Lexapro as she was in the process of transitioning from one psychiatrist to this office. She relates she has been maintained on to [facility] for years after trying multiple other occasions: Seroquel, trazodone, Ambien and they'll [place]. She states both somber was effective for her but cost was a problem.  She denies any symptoms of mania and denies any symptoms of psychosis. Elements:  Duration:  As noted above. Associated Signs/Symptoms: Depression Symptoms:  depressed mood, insomnia, feelings of worthlessness/guilt, difficulty concentrating, loss of energy/fatigue, disturbed sleep, Patient denies any past suicide attempts. She states she might of felt some suicidal ideation year ago when they initially moved to [place]. (Hypo) Manic Symptoms:  denied Anxiety Symptoms:  Excessive Worry, patient indicates a lot of her worries around financial issues and states that financial stress has been an ongoing issue since childhood. Psychotic Symptoms:  none PTSD Symptoms: NA  Past Medical History:  Past Medical History  Diagnosis Date  . Anxiety   . Depression   .  Chronic kidney disease   . Thyroid disease     Past Surgical History  Procedure Laterality Date  . Abdominal hysterectomy    . Cesarean section    . Replacement total knee  Left   . Hip surgery Right    Family History:  Family History  Problem Relation Age of Onset  . Hypertension Mother   . Stroke Mother   . Heart attack Father   . Alcohol abuse Father   . Depression Father   . Post-traumatic stress disorder Father   . Rheum arthritis Sister   . Hypertension Sister   . Depression Sister    Social History:   Social History   Social History  . Marital Status: Married    Spouse Name: N/A  . Number of Children: N/A  . Years of Education: N/A   Social History Main Topics  . Smoking status: Never Smoker   . Smokeless tobacco: Never Used  . Alcohol Use: No  . Drug Use: No  . Sexual Activity: Not Currently   Other Topics Concern  . None   Social History Narrative  . None   Additional Social History: Patient grew up in [place]. She has one younger sister. States her mother and father are both in the home. She states her father died at age 37. She states that her father was a had issues with alcohol and was also a World War II veteran. She states he was sometimes socially disconnected from them. She states that she was frustrated by his behavior when she was young but she states now she understands the issues with addiction and perhaps that he was dealing with emotional/psychological issues. She graduated from school and attended School. She denied repeating any grades but did state that she had difficulties math and also went to summer school for mathematics. 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 She states that she then saw a full-time work at [employer] in Clinical biochemist and then ultimately work for [employer] before stopping work due to medical conditions at age 71.  Patient has been in her second marriage for 43 years. She had one prior marriage lasted 2 years. She has 2 children a boy age 35 and a girl age 59. She has 2 grandchildren and indicates she takes great pleasure  and being around them.  Musculoskeletal: Strength & Muscle Tone: within normal limits Gait & Station: normal Patient leans: N/A  Psychiatric Specialty Exam: HPI  Review of Systems  Psychiatric/Behavioral: Positive for depression. Negative for suicidal ideas, hallucinations, memory loss and substance abuse. The patient is nervous/anxious (finances) and has insomnia.     Blood pressure 142/88, pulse 101, temperature 97.5 F (36.4 C), temperature source Tympanic, height  (1.626 m), weight 282 lb 9.6 oz (128.187 kg), SpO2 92 %.Body mass index is 48.48 kg/(m^2).  General Appearance: Negative  Eye Contact:  Good  Speech:  Normal Rate  Volume:  Normal  Mood:  Good  Affect:  Congruent  Thought Process:  Negative  Orientation:  Full (Time, Place, and Person)  Thought Content:  Negative  Suicidal Thoughts:  No  Homicidal Thoughts:  No  Memory:  Immediate;   Good Recent;   Good Remote;   Good  Judgement:  Good  Insight:  Good  Psychomotor Activity:  Negative  Concentration:  Good  Recall:  Good  Fund of Knowledge:Good  Language: Good  Akathisia:  Negative  Handed:  Right unknown   AIMS (if indicated):  N/A  Assets:  Communication Skills Desire for Improvement Social Support  ADL's:  Intact  Cognition: WNL  Sleep:  Good with medications   Is the patient at risk to self?  Yes.   Has the patient been a risk to self in the past 6 months?  Yes.   Has the patient been a risk to self within the distant past?  Yes.   Is the patient a risk to others?  Yes.   Has the patient been a risk to others in the past 6 months?  No. Has the patient been a risk to others within the distant past?  No.  Allergies:   Allergies  Allergen Reactions  . Penicillins Rash   Current Medications: Current Outpatient Prescriptions  Medication Sig Dispense Refill  . amLODipine (NORVASC) 10 MG tablet Take by mouth.    Marland Kitchen amLODipine (NORVASC) 10 MG tablet Take 10 mg by mouth daily.  11  . aspirin  EC 81 MG tablet Take by mouth.    . Cholecalciferol (VITAMIN D) 2000 UNITS tablet Take by mouth.    . esomeprazole (NEXIUM) 20 MG capsule Take by mouth.    . furosemide (LASIX) 40 MG tablet TAKE 1 TABLET ONCE DAILY    . hydroxychloroquine (PLAQUENIL) 200 MG tablet TAKE 1 TABLET BY MOUTH ONCE A DAY AS DIRECTED    . hydroxychloroquine (PLAQUENIL) 200 MG tablet TAKE 1 TABLET BY MOUTH ONCE A DAY AS DIRECTED  6  . levothyroxine (SYNTHROID, LEVOTHROID) 200 MCG tablet TAKE 1 TABLET ONCE DAILY- ON AN EMPTY STOMACH WITH A GLASS OF WATER 30-60 MINUTES BEFORE BREAKFAST  5  . levothyroxine (SYNTHROID, LEVOTHROID) 25 MCG tablet TAKE 1 TABLET BY MOUTH EVERY DAY (TAKE WITH 20O MCG TABLET TO EQUAL A 225 MCG DOSE)  5  . loratadine (CLARITIN) 10 MG tablet Take by mouth.    . metoprolol succinate (TOPROL-XL) 50 MG 24 hr tablet TAKE 1 TABLET (50 MG TOTAL) BY MOUTH ONCE DAILY.  1  . Multiple Vitamin (MULTI-VITAMINS) TABS Take by mouth.    . oxyCODONE (OXY IR/ROXICODONE) 5 MG immediate release tablet TAKE 1 TABLET EVERY 6 HOURS FOR 30 DAYS  0  . spironolactone (ALDACTONE) 25 MG tablet TAKE 1/2 (ONE-HALF) TABLET BY MOUTH DAILY  11  . DULoxetine (CYMBALTA) 30 MG capsule Two tablets in the morning, and one in the evening. 90 capsule 1  . temazepam (RESTORIL) 22.5 MG capsule Take 1 capsule (22.5 mg total) by mouth at bedtime as needed for sleep. 30 capsule 0   No current facility-administered medications for this visit.    Previous Psychotropic Medications: Yes   Substance Abuse History in the last 12 months:  No.  Consequences of Substance Abuse: NA  Medical Decision Making:  Established Problem, Stable/Improving (1), Review of Medication Regimen & Side Effects (2) and Review of New Medication or Change in Dosage (2)  Treatment Plan Summary: Medication management and Plan Insomnia-we will continue her to temazepam 22.5 mg at bedtime. I did discuss the issues with benzodiazepines and elderly in regards to balance  issues and the use of them with opioids. Patient reports good response with Belsomra but states cost is an issue. She is artery taking some melatonin 10 mg with her temezapam. She also has sleep apnea and thus there may be some limitations in terms of medications that patient can use for her insomnia. patient has a prescription for her to temazepam at this time with  2 refills. She is going to fill one of those refills today and thus she will have 60 days worth of this medication until she will need more of it.  Depression-patient reports duration of depressive episodes about one week and thus her symptoms are more consistent with other specified depressive disorder. She is on Cymbalta but reports a better response when Lexapro was added to Cymbalta. However given a desire to avoid too much serotonin stimulation we will increase her Cymbalta from 60 mg a day to 60 mg in the morning and 30 Milligan's in the evening. I've informed patient she can use her 60 mg tablets for her morning dose. This is been written out on her after visit summary.  In regards to risk assessment the patient does have depression, recent age as risk factors. She has protective factors of no past suicide attempts, engage in treatment, good social supports, forward thinking and gender. At this time low risk of imminent harm to herself or others.   Wallace Going 9/27/201611:59 AM

## 2014-10-16 ENCOUNTER — Ambulatory Visit: Payer: Medicare Other | Admitting: Licensed Clinical Social Worker

## 2014-10-21 ENCOUNTER — Other Ambulatory Visit: Payer: Self-pay | Admitting: Internal Medicine

## 2014-10-21 DIAGNOSIS — Z1231 Encounter for screening mammogram for malignant neoplasm of breast: Secondary | ICD-10-CM

## 2014-10-27 ENCOUNTER — Ambulatory Visit (INDEPENDENT_AMBULATORY_CARE_PROVIDER_SITE_OTHER): Payer: Medicare Other | Admitting: Psychiatry

## 2014-10-27 ENCOUNTER — Encounter: Payer: Self-pay | Admitting: Psychiatry

## 2014-10-27 VITALS — BP 132/82 | HR 104 | Temp 97.6°F | Ht 65.0 in | Wt 281.6 lb

## 2014-10-27 DIAGNOSIS — F329 Major depressive disorder, single episode, unspecified: Secondary | ICD-10-CM

## 2014-10-27 DIAGNOSIS — G4700 Insomnia, unspecified: Secondary | ICD-10-CM

## 2014-10-27 DIAGNOSIS — E66812 Obesity, class 2: Secondary | ICD-10-CM | POA: Insufficient documentation

## 2014-10-27 DIAGNOSIS — F0631 Mood disorder due to known physiological condition with depressive features: Secondary | ICD-10-CM

## 2014-10-27 MED ORDER — HYDROXYZINE PAMOATE 50 MG PO CAPS
50.0000 mg | ORAL_CAPSULE | Freq: Every evening | ORAL | Status: DC | PRN
Start: 2014-10-27 — End: 2014-10-27

## 2014-10-27 MED ORDER — HYDROXYZINE PAMOATE 50 MG PO CAPS: ORAL_CAPSULE | ORAL | Status: DC

## 2014-10-27 MED ORDER — DULOXETINE HCL 30 MG PO CPEP: ORAL_CAPSULE | ORAL | Status: DC

## 2014-10-27 MED ORDER — TEMAZEPAM 22.5 MG PO CAPS: 22.5000 mg | ORAL_CAPSULE | Freq: Every evening | ORAL | Status: DC | PRN

## 2014-10-27 NOTE — Progress Notes (Signed)
BH MD/PA/NP OP Progress Note  10/27/2014 4:07 PM Laura [name]  MRN:  [id number]  Subjective:  Patient indicates that overall her depression is under good control with her Cymbalta. She indicates she requested writer provided her 90 day prescription because it is significantly cheaper for her. She states otherwise her biggest issue is sleeping. She states her to temazepam  is not as effective as it used to be. She indicates that she'll take it at 10 but it does not help her fall asleep sometimes to 5 in the morning. I did discuss with her that there might be other options for sleep aids however she has sleep apnea and uses CPAP. Explained that many sleep aids are not recommended for people with sleep apnea. 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 I indicated that I was not comfortable trying other sleep medications and that even to temazepam is not ideal. However she's had years of being on that medication without adverse events. Chief Complaint:  Chief Complaint    Follow-up; Medication Refill; Medication Problem     Visit Diagnosis:     ICD-9-CM ICD-10-CM   1. 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 Insomnia 780.52 G47.00     Past Medical History:  Past Medical History  Diagnosis Date  . Anxiety   . Depression   . Chronic kidney disease   . Thyroid disease     Past Surgical History  Procedure Laterality Date  . Abdominal hysterectomy    . Cesarean section    . Replacement total knee Left   . Hip surgery Right    Family History:  Family History  Problem Relation Age of Onset  . Hypertension Mother   . Stroke Mother   . Heart attack Father   . Alcohol abuse Father   . Depression Father   . Post-traumatic stress disorder Father   . Rheum arthritis Sister   . Hypertension Sister   . Depression Sister    Social History:  Social History   Social History  . Marital Status: Married    Spouse  Name: N/A  . Number of Children: N/A  . Years of Education: N/A   Social History Main Topics  . Smoking status: Never Smoker   . Smokeless tobacco: Never Used  . Alcohol Use: No  . Drug Use: No  . Sexual Activity: Not Currently   Other Topics Concern  . None   Social History Narrative   Additional History:   Assessment:   Musculoskeletal: Strength & Muscle Tone: within normal limits Gait & Station: normal Patient leans: N/A  Psychiatric Specialty Exam: HPI  Review of Systems  Psychiatric/Behavioral: Negative for depression, suicidal ideas, hallucinations, memory loss and substance abuse. The patient has insomnia. The patient is not nervous/anxious.   All other systems reviewed and are negative.   Blood pressure 132/82, pulse 104, temperature 97.6 F (36.4 C), temperature source Tympanic, height 5\' 5"  (1.651 m), weight 281 lb 9.6 oz (127.733 kg), SpO2 95 %.Body mass index is 46.86 kg/(m^2).  General Appearance: Well Groomed  Eye Contact:  Good  Speech:  Normal Rate  Volume:  Normal  Mood:  Good  Affect:  Congruent  Thought Process:  Linear and Logical  Orientation:  Full (Time, Place, and Person)  Thought Content:  Negative  Suicidal Thoughts:  No  Homicidal Thoughts:  No  Memory:  Immediate;   Good Recent;   Good Remote;  Good  Judgement:  Good  Insight:  Good  Psychomotor Activity:  Negative  Concentration:  Good  Recall:  Good  Fund of Knowledge: Good  Language: Good  Akathisia:  Negative  Handed:    AIMS (if indicated):  N/A  Assets:  Communication Skills Desire for Improvement Social Support  ADL's:  Intact  Cognition: WNL  Sleep:  fair   Is the patient at risk to self?  No. Has the patient been a risk to self in the past 6 months?  No. Has the patient been a risk to self within the distant past?  No. Is the patient a risk to others?  No. Has the patient been a risk to others in the past 6 months?  No. Has the patient been a risk to others  within the distant past?  No.  Current Medications: Current Outpatient Prescriptions  Medication Sig Dispense Refill  . amLODipine (NORVASC) 10 MG tablet Take 10 mg by mouth daily.  11  . aspirin EC 81 MG tablet Take by mouth.    . Cholecalciferol (VITAMIN D) 2000 UNITS tablet Take by mouth.    . DULoxetine (CYMBALTA) 30 MG capsule Two tablets in the morning, and one in the evening. 270 capsule 0  . esomeprazole (NEXIUM) 20 MG capsule Take by mouth.    . furosemide (LASIX) 40 MG tablet TAKE 1 TABLET ONCE DAILY    . hydroxychloroquine (PLAQUENIL) 200 MG tablet TAKE 1 TABLET BY MOUTH ONCE A DAY AS DIRECTED    . hydroxychloroquine (PLAQUENIL) 200 MG tablet TAKE 1 TABLET BY MOUTH ONCE A DAY AS DIRECTED  6  . levothyroxine (SYNTHROID, LEVOTHROID) 200 MCG tablet TAKE 1 TABLET ONCE DAILY- ON AN EMPTY STOMACH WITH A GLASS OF WATER 30-60 MINUTES BEFORE BREAKFAST  5  . levothyroxine (SYNTHROID, LEVOTHROID) 25 MCG tablet TAKE 1 TABLET BY MOUTH EVERY DAY (TAKE WITH 20O MCG TABLET TO EQUAL A 225 MCG DOSE)  5  . loratadine (CLARITIN) 10 MG tablet Take by mouth.    . metoprolol succinate (TOPROL-XL) 50 MG 24 hr tablet TAKE 1 TABLET (50 MG TOTAL) BY MOUTH ONCE DAILY.  1  . Multiple Vitamin (MULTI-VITAMINS) TABS Take by mouth.    . oxyCODONE (OXY IR/ROXICODONE) 5 MG immediate release tablet TAKE 1 TABLET EVERY 6 HOURS FOR 30 DAYS  0  . spironolactone (ALDACTONE) 25 MG tablet TAKE 1/2 (ONE-HALF) TABLET BY MOUTH DAILY  11  . temazepam (RESTORIL) 22.5 MG capsule Take 1 capsule (22.5 mg total) by mouth at bedtime as needed for sleep. 30 capsule 2  . hydrOXYzine (VISTARIL) 50 MG capsule If one capsule is not effective may take two capsules 100 mg at bedtime 60 capsule 2   No current facility-administered medications for this visit.    Medical Decision Making:  Established Problem, Stable/Improving (1), Review of Medication Regimen & Side Effects (2) and Review of New Medication or Change in Dosage  (2)  Treatment Plan Summary:Medication management and Plan    major depressive disorder, recurrent, moderate-patient will continue her Cymbalta 60 mg in the morning and 30 mg in the evening.   Insomnia-we will continue her temazepam mg as she reports some benefit it just occurs later. I encouraged her to perhaps try to take the medication earlier in the evening. I have given her perception for Vistaril and structured that she can take 50-100 mg at bedtime for insomnia. Benefits of been discussing patient's able to consent.  Patient will follow up in 2 months.  Wallace Going 10/27/2014, 4:07 PM

## 2014-11-05 ENCOUNTER — Encounter: Payer: Self-pay | Admitting: Pain Medicine

## 2014-11-05 ENCOUNTER — Other Ambulatory Visit: Payer: Self-pay | Admitting: Pain Medicine

## 2014-11-05 ENCOUNTER — Ambulatory Visit: Payer: Medicare Other | Attending: Pain Medicine | Admitting: Pain Medicine

## 2014-11-05 VITALS — BP 138/76 | HR 75 | Temp 98.1°F | Resp 16 | Wt 284.0 lb

## 2014-11-05 DIAGNOSIS — M4806 Spinal stenosis, lumbar region: Secondary | ICD-10-CM

## 2014-11-05 DIAGNOSIS — F1120 Opioid dependence, uncomplicated: Secondary | ICD-10-CM

## 2014-11-05 DIAGNOSIS — M549 Dorsalgia, unspecified: Secondary | ICD-10-CM | POA: Diagnosis present

## 2014-11-05 DIAGNOSIS — M4726 Other spondylosis with radiculopathy, lumbar region: Secondary | ICD-10-CM

## 2014-11-05 DIAGNOSIS — M47896 Other spondylosis, lumbar region: Secondary | ICD-10-CM | POA: Insufficient documentation

## 2014-11-05 DIAGNOSIS — M1711 Unilateral primary osteoarthritis, right knee: Secondary | ICD-10-CM | POA: Diagnosis not present

## 2014-11-05 DIAGNOSIS — E119 Type 2 diabetes mellitus without complications: Secondary | ICD-10-CM | POA: Insufficient documentation

## 2014-11-05 DIAGNOSIS — Z5181 Encounter for therapeutic drug level monitoring: Secondary | ICD-10-CM

## 2014-11-05 DIAGNOSIS — M48062 Spinal stenosis, lumbar region with neurogenic claudication: Secondary | ICD-10-CM

## 2014-11-05 DIAGNOSIS — K219 Gastro-esophageal reflux disease without esophagitis: Secondary | ICD-10-CM | POA: Diagnosis not present

## 2014-11-05 DIAGNOSIS — E039 Hypothyroidism, unspecified: Secondary | ICD-10-CM | POA: Insufficient documentation

## 2014-11-05 DIAGNOSIS — I129 Hypertensive chronic kidney disease with stage 1 through stage 4 chronic kidney disease, or unspecified chronic kidney disease: Secondary | ICD-10-CM | POA: Diagnosis not present

## 2014-11-05 DIAGNOSIS — Z79899 Other long term (current) drug therapy: Secondary | ICD-10-CM

## 2014-11-05 DIAGNOSIS — F419 Anxiety disorder, unspecified: Secondary | ICD-10-CM | POA: Diagnosis not present

## 2014-11-05 DIAGNOSIS — E785 Hyperlipidemia, unspecified: Secondary | ICD-10-CM | POA: Diagnosis not present

## 2014-11-05 DIAGNOSIS — F1190 Opioid use, unspecified, uncomplicated: Secondary | ICD-10-CM | POA: Diagnosis not present

## 2014-11-05 DIAGNOSIS — M25561 Pain in right knee: Secondary | ICD-10-CM

## 2014-11-05 DIAGNOSIS — M48061 Spinal stenosis, lumbar region without neurogenic claudication: Secondary | ICD-10-CM

## 2014-11-05 DIAGNOSIS — Z79891 Long term (current) use of opiate analgesic: Secondary | ICD-10-CM

## 2014-11-05 DIAGNOSIS — G8929 Other chronic pain: Secondary | ICD-10-CM | POA: Diagnosis not present

## 2014-11-05 DIAGNOSIS — M79606 Pain in leg, unspecified: Secondary | ICD-10-CM | POA: Diagnosis present

## 2014-11-05 DIAGNOSIS — M170 Bilateral primary osteoarthritis of knee: Secondary | ICD-10-CM | POA: Insufficient documentation

## 2014-11-05 DIAGNOSIS — M545 Low back pain: Secondary | ICD-10-CM

## 2014-11-05 DIAGNOSIS — M47816 Spondylosis without myelopathy or radiculopathy, lumbar region: Secondary | ICD-10-CM

## 2014-11-05 MED ORDER — OXYCODONE HCL 5 MG PO CAPS: 5.0000 mg | ORAL_CAPSULE | Freq: Four times a day (QID) | ORAL | Status: DC | PRN

## 2014-11-05 MED ORDER — OXYCODONE HCL 5 MG PO TABS: 5.0000 mg | ORAL_TABLET | Freq: Four times a day (QID) | ORAL | Status: DC | PRN

## 2014-11-05 MED ORDER — METHYLPREDNISOLONE 4 MG PO TBPK: ORAL_TABLET | ORAL | Status: DC

## 2014-11-05 MED ORDER — OXYCODONE HCL 5 MG PO CAPS
5.0000 mg | ORAL_CAPSULE | Freq: Four times a day (QID) | ORAL | Status: DC | PRN
Start: 2014-11-05 — End: 2015-01-22

## 2014-11-05 NOTE — Progress Notes (Signed)
Safety precautions to be maintained throughout the outpatient stay will include: orient to surroundings, keep bed in low position, maintain call bell within reach at all times, provide assistance with transfer out of bed and ambulation. Patient did not bring medications today; encouraged her to bring all medications to every appointment.

## 2014-11-05 NOTE — Patient Instructions (Addendum)
GENERAL RISKS AND COMPLICATIONS  What are the risk, side effects and possible complications? Generally speaking, most procedures are safe.  However, with any procedure there are risks, side effects, and the possibility of complications.  The risks and complications are dependent upon the sites that are lesioned, or the type of nerve block to be performed.  The closer the procedure is to the spine, the more serious the risks are.  Great care is taken when placing the radio frequency needles, block needles or lesioning probes, but sometimes complications can occur.  Infection: Any time there is an injection through the skin, there is a risk of infection.  This is why sterile conditions are used for these blocks.  There are four possible types of infection.  Localized skin infection.  Central Nervous System Infection-This can be in the form of Meningitis, which can be deadly.  Epidural Infections-This can be in the form of an epidural abscess, which can cause pressure inside of the spine, causing compression of the spinal cord with subsequent paralysis. This would require an emergency surgery to decompress, and there are no guarantees that the patient would recover from the paralysis.  Discitis-This is an infection of the intervertebral discs.  It occurs in about 1% of discography procedures.  It is difficult to treat and it may lead to surgery.        2. Pain: the needles have to go through skin and soft tissues, will cause soreness.       3. Damage to internal structures:  The nerves to be lesioned may be near blood vessels or    other nerves which can be potentially damaged.       4. Bleeding: Bleeding is more common if the patient is taking blood thinners such as  aspirin, Coumadin, Ticiid, Plavix, etc., or if he/she have some genetic predisposition  such as hemophilia. Bleeding into the spinal canal can cause compression of the spinal  cord with subsequent paralysis.  This would require an  emergency surgery to  decompress and there are no guarantees that the patient would recover from the  paralysis.       5. Pneumothorax:  Puncturing of a lung is a possibility, every time a needle is introduced in  the area of the chest or upper back.  Pneumothorax refers to free air around the  collapsed lung(s), inside of the thoracic cavity (chest cavity).  Another two possible  complications related to a similar event would include: Hemothorax and Chylothorax.   These are variations of the Pneumothorax, where instead of air around the collapsed  lung(s), you may have blood or chyle, respectively.       6. Spinal headaches: They may occur with any procedures in the area of the spine.       7. Persistent CSF (Cerebro-Spinal Fluid) leakage: This is a rare problem, but may occur  with prolonged intrathecal or epidural catheters either due to the formation of a fistulous  track or a dural tear.       8. Nerve damage: By working so close to the spinal cord, there is always a possibility of  nerve damage, which could be as serious as a permanent spinal cord injury with  paralysis.       9. Death:  Although rare, severe deadly allergic reactions known as "Anaphylactic  reaction" can occur to any of the medications used.      10. Worsening of the symptoms:  We can always make thing worse.    What are the chances of something like this happening? Chances of any of this occuring are extremely low.  By statistics, you have more of a chance of getting killed in a motor vehicle accident: while driving to the hospital than any of the above occurring .  Nevertheless, you should be aware that they are possibilities.  In general, it is similar to taking a shower.  Everybody knows that you can slip, hit your head and get killed.  Does that mean that you should not shower again?  Nevertheless always keep in mind that statistics do not mean anything if you happen to be on the wrong side of them.  Even if a procedure has a 1  (one) in a 1,000,000 (million) chance of going wrong, it you happen to be that one..Also, keep in mind that by statistics, you have more of a chance of having something go wrong when taking medications.  Who should not have this procedure? If you are on a blood thinning medication (e.g. Coumadin, Plavix, see list of "Blood Thinners"), or if you have an active infection going on, you should not have the procedure.  If you are taking any blood thinners, please inform your physician.  How should I prepare for this procedure?  Do not eat or drink anything at least six hours prior to the procedure.  Bring a driver with you .  It cannot be a taxi.  Come accompanied by an adult that can drive you back, and that is strong enough to help you if your legs get weak or numb from the local anesthetic.  Take all of your medicines the morning of the procedure with just enough water to swallow them.  If you have diabetes, make sure that you are scheduled to have your procedure done first thing in the morning, whenever possible.  If you have diabetes, take only half of your insulin dose and notify our nurse that you have done so as soon as you arrive at the clinic.  If you are diabetic, but only take blood sugar pills (oral hypoglycemic), then do not take them on the morning of your procedure.  You may take them after you have had the procedure.  Do not take aspirin or any aspirin-containing medications, at least eleven (11) days prior to the procedure.  They may prolong bleeding.  Wear loose fitting clothing that may be easy to take off and that you would not mind if it got stained with Betadine or blood.  Do not wear any jewelry or perfume  Remove any nail coloring.  It will interfere with some of our monitoring equipment.  NOTE: Remember that this is not meant to be interpreted as a complete list of all possible complications.  Unforeseen problems may occur.  BLOOD THINNERS The following drugs  contain aspirin or other products, which can cause increased bleeding during surgery and should not be taken for 2 weeks prior to and 1 week after surgery.  If you should need take something for relief of minor pain, you may take acetaminophen which is found in Tylenol,m Datril, Anacin-3 and Panadol. It is not blood thinner. The products listed below are.  Do not take any of the products listed below in addition to any listed on your instruction sheet.  A.P.C or A.P.C with Codeine Codeine Phosphate Capsules #3 Ibuprofen Ridaura  ABC compound Congesprin Imuran rimadil  Advil Cope Indocin Robaxisal  Alka-Seltzer Effervescent Pain Reliever and Antacid Coricidin or Coricidin-D  Indomethacin Rufen    Alka-Seltzer plus Cold Medicine Cosprin Ketoprofen S-A-C Tablets  Anacin Analgesic Tablets or Capsules Coumadin Korlgesic Salflex  Anacin Extra Strength Analgesic tablets or capsules CP-2 Tablets Lanoril Salicylate  Anaprox Cuprimine Capsules Levenox Salocol  Anexsia-D Dalteparin Magan Salsalate  Anodynos Darvon compound Magnesium Salicylate Sine-off  Ansaid Dasin Capsules Magsal Sodium Salicylate  Anturane Depen Capsules Marnal Soma  APF Arthritis pain formula Dewitt's Pills Measurin Stanback  Argesic Dia-Gesic Meclofenamic Sulfinpyrazone  Arthritis Bayer Timed Release Aspirin Diclofenac Meclomen Sulindac  Arthritis pain formula Anacin Dicumarol Medipren Supac  Analgesic (Safety coated) Arthralgen Diffunasal Mefanamic Suprofen  Arthritis Strength Bufferin Dihydrocodeine Mepro Compound Suprol  Arthropan liquid Dopirydamole Methcarbomol with Aspirin Synalgos  ASA tablets/Enseals Disalcid Micrainin Tagament  Ascriptin Doan's Midol Talwin  Ascriptin A/D Dolene Mobidin Tanderil  Ascriptin Extra Strength Dolobid Moblgesic Ticlid  Ascriptin with Codeine Doloprin or Doloprin with Codeine Momentum Tolectin  Asperbuf Duoprin Mono-gesic Trendar  Aspergum Duradyne Motrin or Motrin IB Triminicin  Aspirin  plain, buffered or enteric coated Durasal Myochrisine Trigesic  Aspirin Suppositories Easprin Nalfon Trillsate  Aspirin with Codeine Ecotrin Regular or Extra Strength Naprosyn Uracel  Atromid-S Efficin Naproxen Ursinus  Auranofin Capsules Elmiron Neocylate Vanquish  Axotal Emagrin Norgesic Verin  Azathioprine Empirin or Empirin with Codeine Normiflo Vitamin E  Azolid Emprazil Nuprin Voltaren  Bayer Aspirin plain, buffered or children's or timed BC Tablets or powders Encaprin Orgaran Warfarin Sodium  Buff-a-Comp Enoxaparin Orudis Zorpin  Buff-a-Comp with Codeine Equegesic Os-Cal-Gesic   Buffaprin Excedrin plain, buffered or Extra Strength Oxalid   Bufferin Arthritis Strength Feldene Oxphenbutazone   Bufferin plain or Extra Strength Feldene Capsules Oxycodone with Aspirin   Bufferin with Codeine Fenoprofen Fenoprofen Pabalate or Pabalate-SF   Buffets II Flogesic Panagesic   Buffinol plain or Extra Strength Florinal or Florinal with Codeine Panwarfarin   Buf-Tabs Flurbiprofen Penicillamine   Butalbital Compound Four-way cold tablets Penicillin   Butazolidin Fragmin Pepto-Bismol   Carbenicillin Geminisyn Percodan   Carna Arthritis Reliever Geopen Persantine   Carprofen Gold's salt Persistin   Chloramphenicol Goody's Phenylbutazone   Chloromycetin Haltrain Piroxlcam   Clmetidine heparin Plaquenil   Cllnoril Hyco-pap Ponstel   Clofibrate Hydroxy chloroquine Propoxyphen         Before stopping any of these medications, be sure to consult the physician who ordered them.  Some, such as Coumadin (Warfarin) are ordered to prevent or treat serious conditions such as "deep thrombosis", "pumonary embolisms", and other heart problems.  The amount of time that you may need off of the medication may also vary with the medication and the reason for which you were taking it.  If you are taking any of these medications, please make sure you notify your pain physician before you undergo any  procedures.         Knee Injection A knee injection is a procedure to get medicine into your knee joint. Your health care provider puts a needle into the joint and injects medicine with an attached syringe. The injected medicine may relieve the pain, swelling, and stiffness of arthritis. The injected medicine may also help to lubricate and cushion your knee joint. You may need more than one injection. LET [facility] CARE PROVIDER KNOW ABOUT:  Any allergies you have.  All medicines you are taking, including vitamins, herbs, eye drops, creams, and over-the-counter medicines.  Previous problems you or members of your family have had with the use of anesthetics.  Any blood disorders you have.  Previous surgeries you have had.  Any medical conditions you  may have. RISKS AND COMPLICATIONS Generally, this is a safe procedure. However, problems may occur, including:  Infection.  Bleeding.  Worsening symptoms.  Damage to the area around your knee.  Allergic reaction to any of the medicines.  Skin reactions from repeated injections. BEFORE THE PROCEDURE  Ask your health care provider about changing or stopping your regular medicines. This is especially important if you are taking diabetes medicines or blood thinners.  Plan to have someone take you home after the procedure. PROCEDURE  You will sit or lie down in a position for your knee to be treated.  The skin over your kneecap will be cleaned with a germ-killing solution (antiseptic).  You will be given a medicine that numbs the area (local anesthetic). You may feel some stinging.  After your knee becomes numb, you will have a second injection. This is the medicine. This needle is carefully placed between your kneecap and your knee. The medicine is injected into the joint space.  At the end of the procedure, the needle will be removed.  A bandage (dressing) may be placed over the injection site. The procedure may vary  among health care providers and hospitals. AFTER THE PROCEDURE  You may have to move your knee through its full range of motion. This helps to get all of the medicine into your joint space.  Your blood pressure, heart rate, breathing rate, and blood oxygen level will be monitored often until the medicines you were given have worn off.  You will be watched to make sure that you do not have a reaction to the injected medicine.   This information is not intended to replace advice given to you by your health care provider. Make sure you discuss any questions you have with your health care provider.   Document Released: 03/20/2006 Document Revised: 01/17/2014 Document Reviewed: 11/06/2013 Elsevier Interactive Patient Education 2016 [employer] Radiofrequency Lesioning Radiofrequency lesioning is a procedure that is performed to relieve pain. The procedure is often used for back, neck, or arm pain. Radiofrequency lesioning involves the use of a machine that creates radio waves to make heat. During the procedure, the heat is applied to the nerve that carries the pain signal. The heat damages the nerve and interferes with the pain signal. Pain relief usually lasts for 6 months to 1 year. LET [facility] CARE PROVIDER KNOW ABOUT:  Any allergies you have.  All medicines you are taking, including vitamins, herbs, eye drops, creams, and over-the-counter medicines.  Previous problems you or members of your family have had with the use of anesthetics.  Any blood disorders you have.  Previous surgeries you have had.  Any medical conditions you have.  Whether you are pregnant or may be pregnant. RISKS AND COMPLICATIONS Generally, this is a safe procedure. However, problems may occur, including:  Pain or soreness at the injection site.  Infection at the injection site.  Damage to nerves or blood vessels. BEFORE THE PROCEDURE  Ask your health care provider about:  Changing or stopping your  regular medicines. This is especially important if you are taking diabetes medicines or blood thinners.  Taking medicines such as aspirin and ibuprofen. These medicines can thin your blood. Do not take these medicines before your procedure if your health care provider instructs you not to.  Follow instructions from your health care provider about eating or drinking restrictions.  Plan to have someone take you home after the procedure.  If you go home right after the procedure,  plan to have someone with you for 24 hours. PROCEDURE  You will be given one or more of the following:  A medicine to help you relax (sedative).  A medicine to numb the area (local anesthetic).  You will be awake during the procedure. You will need to be able to talk with the health care provider during the procedure.  With the help of a type of X-ray (fluoroscopy), the health care provider will insert a radiofrequency needle into the area to be treated.  Next, a wire that carries the radio waves (electrode) will be put through the radiofrequency needle. An electrical pulse will be sent through the electrode to verify the correct nerve. You will feel a tingling sensation, and you may have muscle twitching.  Then, the tissue that is around the needle tip will be heated by an electric current that is passed using the radiofrequency machine. This will numb the nerves.  A bandage (dressing) will be put on the insertion area after the procedure is done. The procedure may vary among health care providers and hospitals. AFTER THE PROCEDURE  Your blood pressure, heart rate, breathing rate, and blood oxygen level will be monitored often until the medicines you were given have worn off.  Return to your normal activities as directed by your health care provider.   This information is not intended to replace advice given to you by your health care provider. Make sure you discuss any questions you have with your health care  provider.   Document Released: 08/25/2010 Document Revised: 09/17/2014 Document Reviewed: 02/03/2014 Elsevier Interactive Patient Education [employer]

## 2014-11-07 DIAGNOSIS — M48062 Spinal stenosis, lumbar region with neurogenic claudication: Secondary | ICD-10-CM | POA: Insufficient documentation

## 2014-11-07 DIAGNOSIS — M48061 Spinal stenosis, lumbar region without neurogenic claudication: Secondary | ICD-10-CM | POA: Insufficient documentation

## 2014-11-07 DIAGNOSIS — F1120 Opioid dependence, uncomplicated: Secondary | ICD-10-CM | POA: Insufficient documentation

## 2014-11-07 DIAGNOSIS — G8929 Other chronic pain: Secondary | ICD-10-CM | POA: Insufficient documentation

## 2014-11-07 DIAGNOSIS — M47816 Spondylosis without myelopathy or radiculopathy, lumbar region: Secondary | ICD-10-CM | POA: Insufficient documentation

## 2014-11-07 HISTORY — DX: Spinal stenosis, lumbar region with neurogenic claudication: M48.062

## 2014-11-07 NOTE — Progress Notes (Signed)
[name] MRN: [id number] DOB: Nov 27, 1945 DOS: 11/05/2014  Primary Reason(s) for Visit: Encounter for Medication Management. CC: Back Pain and Leg Pain   HPI:   Laura [name] is a 69 y.o. year old, female patient, who returns today as an established patient. She has Acquired hypothyroidism; Anxiety; Chronic kidney disease (CKD), stage III (moderate); Clinical depression; Diabetes mellitus, type 2 (HCC); Essential (primary) hypertension; Acid reflux; Adult hypothyroidism; HLD (hyperlipidemia); Gastro-esophageal reflux disease without esophagitis; Arthritis, degenerative; Extreme obesity (HCC); Cannot sleep; Rheumatoid arthritis without elevated rheumatoid factor (HCC); Seizure (HCC); Memory loss, short term; Spinal stenosis; Congestive heart failure with left ventricular systolic dysfunction (HCC); Type 2 diabetes mellitus (HCC); Morbid obesity (HCC); Seronegative rheumatoid arthritis (HCC); Chronic pain; Chronic pain of right knee; Osteoarthritis of right knee; Opiate use; Long term current use of opiate analgesic; Long term prescription opiate use; Encounter for therapeutic drug level monitoring; Opioid dependence (HCC); Lumbar spinal stenosis; Lumbar spondylosis; Lumbar facet syndrome (bilateral); and Lumbar spinal stenosis with neurogenic claudication on her problem list.. Her primarily concern today is the Back Pain and Leg Pain    The patient returns to the clinic today after last having been seen on 08/14/2014. Recently we had done a right sided genicular nerve block with 100% relief of her pain. She was very excited about this treatment for her knee and wants to proceed with the RFA. Today we will go ahead and put that on the schedule.  Pharmacotherapy Review: Side-effects or Adverse reactions: None reported. Effectiveness: Described as relatively effective, allowing for increase in activities of daily living (ADL). Onset of action: Within expected pharmacological  parameters. Duration of action: Within normal limits for medication. Peak effect: Timing and results are as within normal expected parameters. [place] PMP: Compliant with practice rules and regulations. DST: Compliant with practice rules and regulations. Date of Last UDS: 11/05/2014 Lab work: No new labs ordered by our practice. Treatment compliance: Compliant. Substance Use Disorder (SUD) Risk Level: Low Planned course of action: Continue therapy as is.  Post-Procedure Assessment: Right knee genicular nerve block. Side-effects or Adverse reactions: No significant issues reported. Sedation: No sedation used during procedure. Ultra-Short Term Relief (Initial 30-60 minutes after procedure):  100% Short-Term Relief (Initial 6 hours after procedure):  100% Long-Term Relief (Initial 2 weeks after procedure):  100% Current Relief (Now):  80 % Interpretation of Results: Ultra-short term relief is a normal physiological response to analgesics and anesthetics provided during the procedure. Short-term relief confirms injected site as etiology of pain. Long term relief is possibly due to sympathetic blockade, or the effects of steroids, if administered during procedure. Persistent relief would suggest effective anti-inflammatory effects from steroids.  Allergies: Laura [name] is allergic to penicillins.  Meds: The patient has a current medication list which includes the following prescription(s): amlodipine, aspirin ec, vitamin d, duloxetine, esomeprazole, furosemide, hydroxychloroquine, hydroxyzine, levothyroxine, levothyroxine, loratadine, metoprolol succinate, multi-vitamins, oxycodone, spironolactone, temazepam, methylprednisolone, oxycodone, and oxycodone. Requested Prescriptions   Signed Prescriptions Disp Refills  . oxyCODONE (OXY IR/ROXICODONE) 5 MG immediate release tablet 120 tablet 0    Sig: Take 1 tablet (5 mg total) by mouth every 6 (six) hours as needed for severe pain.  Marland Kitchen oxycodone  (OXY-IR) 5 MG capsule 120 capsule 0    Sig: Take 1 capsule (5 mg total) by mouth every 6 (six) hours as needed for pain.  Marland Kitchen oxycodone (OXY-IR) 5 MG capsule 120 capsule 0    Sig: Take 1 capsule (5 mg total) by mouth every 6 (six)  hours as needed for pain.  . methylPREDNISolone (MEDROL DOSEPAK) 4 MG TBPK tablet 21 tablet 0    Sig: Take as instructed.    ROS: Constitutional: Afebrile, no chills, well hydrated and well nourished Gastrointestinal: negative Musculoskeletal:negative Neurological: negative Behavioral/Psych: negative  PFSH: Medical:  Laura [name]  has a past medical history of Anxiety; Depression; Chronic kidney disease; Thyroid disease; and Allergy. Family: family history includes Alcohol abuse in her father; Depression in her father and sister; Heart attack in her father; Hypertension in her mother and sister; Post-traumatic stress disorder in her father; Rheum arthritis in her sister; Stroke in her mother. Surgical:  has past surgical history that includes Abdominal hysterectomy; Cesarean section; Replacement total knee (Left); and Hip surgery (Right). Tobacco:  reports that she has never smoked. She has never used smokeless tobacco. Alcohol:  reports that she does not drink alcohol. Drug:  reports that she does not use illicit drugs.  Physical Exam: Vitals:  Today's Vitals   11/05/14 1537 11/05/14 1545  BP: 138/76   Pulse: 75   Temp: 98.1 F (36.7 C)   TempSrc: Oral   Resp: 16   Weight: 284 lb (128.822 kg)   SpO2: 97%   PainSc: 5  5   PainLoc: Back   Calculated BMI: Body mass index is 47.26 kg/(m^2). General appearance: alert, cooperative, appears stated age, moderate distress and morbidly obese Eyes: conjunctivae/corneas clear. PERRL, EOM's intact. Fundi benign. Lungs: No evidence respiratory distress, no audible rales or ronchi and no use of accessory muscles of respiration Neck: no adenopathy, no carotid bruit, no JVD, supple, symmetrical, trachea midline and  thyroid not enlarged, symmetric, no tenderness/mass/nodules Back: symmetric, no curvature. ROM normal. No CVA tenderness. Extremities: Today she is ambulating again using her walker. She is having a lot of difficulty with this secondary to her lumbar spinal stenosis as well as they osteoarthritis in her knees. Pulses: 2+ and symmetric Skin: Skin color, texture, turgor normal. No rashes or lesions Neurologic: Gait: Antalgic   Assessment: Encounter Diagnosis:  Primary Diagnosis: Chronic pain [G89.29]  Plan: Laura [name] was seen today for back pain and leg pain.  Diagnoses and all orders for this visit:  Chronic pain -     oxyCODONE (OXY IR/ROXICODONE) 5 MG immediate release tablet; Take 1 tablet (5 mg total) by mouth every 6 (six) hours as needed for severe pain. -     oxycodone (OXY-IR) 5 MG capsule; Take 1 capsule (5 mg total) by mouth every 6 (six) hours as needed for pain. -     oxycodone (OXY-IR) 5 MG capsule; Take 1 capsule (5 mg total) by mouth every 6 (six) hours as needed for pain. -     methylPREDNISolone (MEDROL DOSEPAK) 4 MG TBPK tablet; Take as instructed.  Chronic pain of right knee -     RADIOFREQUENCY, CERVICAL THORACIC LUMBER, GENICULAR ; Future  Primary osteoarthritis of right knee  Opiate use  Long term current use of opiate analgesic  Long term prescription opiate use -     Drugs of abuse screen w/o alc, rtn urine-sln; Future  Encounter for therapeutic drug level monitoring  Uncomplicated opioid dependence (HCC)  Lumbar spinal stenosis  Osteoarthritis of spine with radiculopathy, lumbar region  Lumbar facet syndrome (bilateral)  Lumbar spinal stenosis with neurogenic claudication  Morbid obesity due to excess calories [facility])     Patient Instructions   GENERAL RISKS AND COMPLICATIONS  What are the risk, side effects and possible complications? Generally speaking, most procedures are safe.  However, with any procedure there are risks, side effects,  and the possibility of complications.  The risks and complications are dependent upon the sites that are lesioned, or the type of nerve block to be performed.  The closer the procedure is to the spine, the more serious the risks are.  Great care is taken when placing the radio frequency needles, block needles or lesioning probes, but sometimes complications can occur.  Infection: Any time there is an injection through the skin, there is a risk of infection.  This is why sterile conditions are used for these blocks.  There are four possible types of infection.  Localized skin infection.  Central Nervous System Infection-This can be in the form of Meningitis, which can be deadly.  Epidural Infections-This can be in the form of an epidural abscess, which can cause pressure inside of the spine, causing compression of the spinal cord with subsequent paralysis. This would require an emergency surgery to decompress, and there are no guarantees that the patient would recover from the paralysis.  Discitis-This is an infection of the intervertebral discs.  It occurs in about 1% of discography procedures.  It is difficult to treat and it may lead to surgery.        2. Pain: the needles have to go through skin and soft tissues, will cause soreness.       3. Damage to internal structures:  The nerves to be lesioned may be near blood vessels or    other nerves which can be potentially damaged.       4. Bleeding: Bleeding is more common if the patient is taking blood thinners such as  aspirin, Coumadin, Ticiid, Plavix, etc., or if he/she have some genetic predisposition  such as hemophilia. Bleeding into the spinal canal can cause compression of the spinal  cord with subsequent paralysis.  This would require an emergency surgery to  decompress and there are no guarantees that the patient would recover from the  paralysis.       5. Pneumothorax:  Puncturing of a lung is a possibility, every time a needle is  introduced in  the area of the chest or upper back.  Pneumothorax refers to free air around the  collapsed lung(s), inside of the thoracic cavity (chest cavity).  Another two possible  complications related to a similar event would include: Hemothorax and Chylothorax.   These are variations of the Pneumothorax, where instead of air around the collapsed  lung(s), you may have blood or chyle, respectively.       6. Spinal headaches: They may occur with any procedures in the area of the spine.       7. Persistent CSF (Cerebro-Spinal Fluid) leakage: This is a rare problem, but may occur  with prolonged intrathecal or epidural catheters either due to the formation of a fistulous  track or a dural tear.       8. Nerve damage: By working so close to the spinal cord, there is always a possibility of  nerve damage, which could be as serious as a permanent spinal cord injury with  paralysis.       9. Death:  Although rare, severe deadly allergic reactions known as "Anaphylactic  reaction" can occur to any of the medications used.      10. Worsening of the symptoms:  We can always make thing worse.  What are the chances of something like this happening? Chances of any of this occuring are extremely low.  By statistics, you have more of a chance of getting killed in a motor vehicle accident: while driving to the hospital than any of the above occurring .  Nevertheless, you should be aware that they are possibilities.  In general, it is similar to taking a shower.  Everybody knows that you can slip, hit your head and get killed.  Does that mean that you should not shower again?  Nevertheless always keep in mind that statistics do not mean anything if you happen to be on the wrong side of them.  Even if a procedure has a 1 (one) in a 1,000,000 (million) chance of going wrong, it you happen to be that one..Also, keep in mind that by statistics, you have more of a chance of having something go wrong when taking  medications.  Who should not have this procedure? If you are on a blood thinning medication (e.g. Coumadin, Plavix, see list of "Blood Thinners"), or if you have an active infection going on, you should not have the procedure.  If you are taking any blood thinners, please inform your physician.  How should I prepare for this procedure?  Do not eat or drink anything at least six hours prior to the procedure.  Bring a driver with you .  It cannot be a taxi.  Come accompanied by an adult that can drive you back, and that is strong enough to help you if your legs get weak or numb from the local anesthetic.  Take all of your medicines the morning of the procedure with just enough water to swallow them.  If you have diabetes, make sure that you are scheduled to have your procedure done first thing in the morning, whenever possible.  If you have diabetes, take only half of your insulin dose and notify our nurse that you have done so as soon as you arrive at the clinic.  If you are diabetic, but only take blood sugar pills (oral hypoglycemic), then do not take them on the morning of your procedure.  You may take them after you have had the procedure.  Do not take aspirin or any aspirin-containing medications, at least eleven (11) days prior to the procedure.  They may prolong bleeding.  Wear loose fitting clothing that may be easy to take off and that you would not mind if it got stained with Betadine or blood.  Do not wear any jewelry or perfume  Remove any nail coloring.  It will interfere with some of our monitoring equipment.  NOTE: Remember that this is not meant to be interpreted as a complete list of all possible complications.  Unforeseen problems may occur.  BLOOD THINNERS The following drugs contain aspirin or other products, which can cause increased bleeding during surgery and should not be taken for 2 weeks prior to and 1 week after surgery.  If you should need take something for  relief of minor pain, you may take acetaminophen which is found in Tylenol,m Datril, Anacin-3 and Panadol. It is not blood thinner. The products listed below are.  Do not take any of the products listed below in addition to any listed on your instruction sheet.  A.P.C or A.P.C with Codeine Codeine Phosphate Capsules #3 Ibuprofen Ridaura  ABC compound Congesprin Imuran rimadil  Advil Cope Indocin Robaxisal  Alka-Seltzer Effervescent Pain Reliever and Antacid Coricidin or Coricidin-D  Indomethacin Rufen  Alka-Seltzer plus Cold Medicine Cosprin Ketoprofen S-A-C Tablets  Anacin Analgesic Tablets or Capsules Coumadin Korlgesic Salflex  Anacin  Extra Strength Analgesic tablets or capsules CP-2 Tablets Lanoril Salicylate  Anaprox Cuprimine Capsules Levenox Salocol  Anexsia-D Dalteparin Magan Salsalate  Anodynos Darvon compound Magnesium Salicylate Sine-off  Ansaid Dasin Capsules Magsal Sodium Salicylate  Anturane Depen Capsules Marnal Soma  APF Arthritis pain formula Dewitt's Pills Measurin Stanback  Argesic Dia-Gesic Meclofenamic Sulfinpyrazone  Arthritis Bayer Timed Release Aspirin Diclofenac Meclomen Sulindac  Arthritis pain formula Anacin Dicumarol Medipren Supac  Analgesic (Safety coated) Arthralgen Diffunasal Mefanamic Suprofen  Arthritis Strength Bufferin Dihydrocodeine Mepro Compound Suprol  Arthropan liquid Dopirydamole Methcarbomol with Aspirin Synalgos  ASA tablets/Enseals Disalcid Micrainin Tagament  Ascriptin Doan's Midol Talwin  Ascriptin A/D Dolene Mobidin Tanderil  Ascriptin Extra Strength Dolobid Moblgesic Ticlid  Ascriptin with Codeine Doloprin or Doloprin with Codeine Momentum Tolectin  Asperbuf Duoprin Mono-gesic Trendar  Aspergum Duradyne Motrin or Motrin IB Triminicin  Aspirin plain, buffered or enteric coated Durasal Myochrisine Trigesic  Aspirin Suppositories Easprin Nalfon Trillsate  Aspirin with Codeine Ecotrin Regular or Extra Strength Naprosyn Uracel  Atromid-S  Efficin Naproxen Ursinus  Auranofin Capsules Elmiron Neocylate Vanquish  Axotal Emagrin Norgesic Verin  Azathioprine Empirin or Empirin with Codeine Normiflo Vitamin E  Azolid Emprazil Nuprin Voltaren  Bayer Aspirin plain, buffered or children's or timed BC Tablets or powders Encaprin Orgaran Warfarin Sodium  Buff-a-Comp Enoxaparin Orudis Zorpin  Buff-a-Comp with Codeine Equegesic Os-Cal-Gesic   Buffaprin Excedrin plain, buffered or Extra Strength Oxalid   Bufferin Arthritis Strength Feldene Oxphenbutazone   Bufferin plain or Extra Strength Feldene Capsules Oxycodone with Aspirin   Bufferin with Codeine Fenoprofen Fenoprofen Pabalate or Pabalate-SF   Buffets II Flogesic Panagesic   Buffinol plain or Extra Strength Florinal or Florinal with Codeine Panwarfarin   Buf-Tabs Flurbiprofen Penicillamine   Butalbital Compound Four-way cold tablets Penicillin   Butazolidin Fragmin Pepto-Bismol   Carbenicillin Geminisyn Percodan   Carna Arthritis Reliever Geopen Persantine   Carprofen Gold's salt Persistin   Chloramphenicol Goody's Phenylbutazone   Chloromycetin Haltrain Piroxlcam   Clmetidine heparin Plaquenil   Cllnoril Hyco-pap Ponstel   Clofibrate Hydroxy chloroquine Propoxyphen         Before stopping any of these medications, be sure to consult the physician who ordered them.  Some, such as Coumadin (Warfarin) are ordered to prevent or treat serious conditions such as "deep thrombosis", "pumonary embolisms", and other heart problems.  The amount of time that you may need off of the medication may also vary with the medication and the reason for which you were taking it.  If you are taking any of these medications, please make sure you notify your pain physician before you undergo any procedures.         Knee Injection A knee injection is a procedure to get medicine into your knee joint. Your health care provider puts a needle into the joint and injects medicine with an attached  syringe. The injected medicine may relieve the pain, swelling, and stiffness of arthritis. The injected medicine may also help to lubricate and cushion your knee joint. You may need more than one injection. LET [facility] CARE PROVIDER KNOW ABOUT:  Any allergies you have.  All medicines you are taking, including vitamins, herbs, eye drops, creams, and over-the-counter medicines.  Previous problems you or members of your family have had with the use of anesthetics.  Any blood disorders you have.  Previous surgeries you have had.  Any medical conditions you may have. RISKS AND COMPLICATIONS Generally, this is a safe procedure. However, problems may occur, including:  Infection.  Bleeding.  Worsening symptoms.  Damage to the area around your knee.  Allergic reaction to any of the medicines.  Skin reactions from repeated injections. BEFORE THE PROCEDURE  Ask your health care provider about changing or stopping your regular medicines. This is especially important if you are taking diabetes medicines or blood thinners.  Plan to have someone take you home after the procedure. PROCEDURE  You will sit or lie down in a position for your knee to be treated.  The skin over your kneecap will be cleaned with a germ-killing solution (antiseptic).  You will be given a medicine that numbs the area (local anesthetic). You may feel some stinging.  After your knee becomes numb, you will have a second injection. This is the medicine. This needle is carefully placed between your kneecap and your knee. The medicine is injected into the joint space.  At the end of the procedure, the needle will be removed.  A bandage (dressing) may be placed over the injection site. The procedure may vary among health care providers and hospitals. AFTER THE PROCEDURE  You may have to move your knee through its full range of motion. This helps to get all of the medicine into your joint space.  Your blood  pressure, heart rate, breathing rate, and blood oxygen level will be monitored often until the medicines you were given have worn off.  You will be watched to make sure that you do not have a reaction to the injected medicine.   This information is not intended to replace advice given to you by your health care provider. Make sure you discuss any questions you have with your health care provider.   Document Released: 03/20/2006 Document Revised: 01/17/2014 Document Reviewed: 11/06/2013 Elsevier Interactive Patient Education 2016 [employer] Radiofrequency Lesioning Radiofrequency lesioning is a procedure that is performed to relieve pain. The procedure is often used for back, neck, or arm pain. Radiofrequency lesioning involves the use of a machine that creates radio waves to make heat. During the procedure, the heat is applied to the nerve that carries the pain signal. The heat damages the nerve and interferes with the pain signal. Pain relief usually lasts for 6 months to 1 year. LET [facility] CARE PROVIDER KNOW ABOUT:  Any allergies you have.  All medicines you are taking, including vitamins, herbs, eye drops, creams, and over-the-counter medicines.  Previous problems you or members of your family have had with the use of anesthetics.  Any blood disorders you have.  Previous surgeries you have had.  Any medical conditions you have.  Whether you are pregnant or may be pregnant. RISKS AND COMPLICATIONS Generally, this is a safe procedure. However, problems may occur, including:  Pain or soreness at the injection site.  Infection at the injection site.  Damage to nerves or blood vessels. BEFORE THE PROCEDURE  Ask your health care provider about:  Changing or stopping your regular medicines. This is especially important if you are taking diabetes medicines or blood thinners.  Taking medicines such as aspirin and ibuprofen. These medicines can thin your blood. Do not take  these medicines before your procedure if your health care provider instructs you not to.  Follow instructions from your health care provider about eating or drinking restrictions.  Plan to have someone take you home after the procedure.  If you go home right after the procedure, plan to have someone with you for 24 hours. PROCEDURE  You will be given one or more of  the following:  A medicine to help you relax (sedative).  A medicine to numb the area (local anesthetic).  You will be awake during the procedure. You will need to be able to talk with the health care provider during the procedure.  With the help of a type of X-ray (fluoroscopy), the health care provider will insert a radiofrequency needle into the area to be treated.  Next, a wire that carries the radio waves (electrode) will be put through the radiofrequency needle. An electrical pulse will be sent through the electrode to verify the correct nerve. You will feel a tingling sensation, and you may have muscle twitching.  Then, the tissue that is around the needle tip will be heated by an electric current that is passed using the radiofrequency machine. This will numb the nerves.  A bandage (dressing) will be put on the insertion area after the procedure is done. The procedure may vary among health care providers and hospitals. AFTER THE PROCEDURE  Your blood pressure, heart rate, breathing rate, and blood oxygen level will be monitored often until the medicines you were given have worn off.  Return to your normal activities as directed by your health care provider.   This information is not intended to replace advice given to you by your health care provider. Make sure you discuss any questions you have with your health care provider.   Document Released: 08/25/2010 Document Revised: 09/17/2014 Document Reviewed: 02/03/2014 Elsevier Interactive Patient Education 2016 [employer].   Medications discontinued today:   Medications Discontinued During This Encounter  Medication Reason  . hydroxychloroquine (PLAQUENIL) 200 MG tablet Error  . oxyCODONE (OXY IR/ROXICODONE) 5 MG immediate release tablet Reorder   Medications administered today:  Laura [name] had no medications administered during this visit.  Primary Care Physician: Leotis Shames, MD Location: [facility] Note by: Sydnee Levans. Laban Emperor, M.D, DABA, DABAPM, DABPM, DABIPP, FIPP

## 2014-11-13 ENCOUNTER — Telehealth: Payer: Self-pay

## 2014-11-13 MED ORDER — DULOXETINE HCL 30 MG PO CPEP: ORAL_CAPSULE | ORAL | Status: DC

## 2014-11-13 NOTE — Telephone Encounter (Signed)
pt called states she can not afford a 90 day supply right now it cost $88.  pt wanted to find out if you can just call in a 30 day supply insteat of 90 day.

## 2014-11-14 LAB — TOXASSURE SELECT 13 (MW), URINE: PDF: 0

## 2014-11-24 NOTE — Progress Notes (Signed)
Quick Note:  Results of UDT were consistent with expected results. No further intervention warranted at this time. ______ 

## 2014-11-25 ENCOUNTER — Ambulatory Visit
Admission: RE | Admit: 2014-11-25 | Discharge: 2014-11-25 | Disposition: A | Discharge status: Home / Self Care | Payer: Medicare Other | Source: Ambulatory Visit | Attending: Internal Medicine | Admitting: Internal Medicine

## 2014-11-25 ENCOUNTER — Encounter: Payer: Self-pay | Admitting: Pain Medicine

## 2014-11-25 ENCOUNTER — Other Ambulatory Visit: Payer: Self-pay | Admitting: Internal Medicine

## 2014-11-25 ENCOUNTER — Ambulatory Visit (HOSPITAL_BASED_OUTPATIENT_CLINIC_OR_DEPARTMENT_OTHER): Payer: Medicare Other | Admitting: Pain Medicine

## 2014-11-25 VITALS — BP 137/63 | HR 68 | Temp 98.4°F | Resp 16 | Ht 65.0 in | Wt 277.0 lb

## 2014-11-25 DIAGNOSIS — M25561 Pain in right knee: Secondary | ICD-10-CM | POA: Diagnosis not present

## 2014-11-25 DIAGNOSIS — M1711 Unilateral primary osteoarthritis, right knee: Secondary | ICD-10-CM

## 2014-11-25 DIAGNOSIS — E785 Hyperlipidemia, unspecified: Secondary | ICD-10-CM | POA: Diagnosis not present

## 2014-11-25 DIAGNOSIS — E039 Hypothyroidism, unspecified: Secondary | ICD-10-CM | POA: Diagnosis not present

## 2014-11-25 DIAGNOSIS — M4806 Spinal stenosis, lumbar region: Secondary | ICD-10-CM | POA: Insufficient documentation

## 2014-11-25 DIAGNOSIS — K219 Gastro-esophageal reflux disease without esophagitis: Secondary | ICD-10-CM | POA: Insufficient documentation

## 2014-11-25 DIAGNOSIS — M069 Rheumatoid arthritis, unspecified: Secondary | ICD-10-CM | POA: Insufficient documentation

## 2014-11-25 DIAGNOSIS — E669 Obesity, unspecified: Secondary | ICD-10-CM | POA: Insufficient documentation

## 2014-11-25 DIAGNOSIS — R52 Pain, unspecified: Secondary | ICD-10-CM | POA: Diagnosis present

## 2014-11-25 DIAGNOSIS — E119 Type 2 diabetes mellitus without complications: Secondary | ICD-10-CM | POA: Insufficient documentation

## 2014-11-25 DIAGNOSIS — G894 Chronic pain syndrome: Secondary | ICD-10-CM | POA: Insufficient documentation

## 2014-11-25 DIAGNOSIS — G8929 Other chronic pain: Secondary | ICD-10-CM | POA: Insufficient documentation

## 2014-11-25 DIAGNOSIS — F329 Major depressive disorder, single episode, unspecified: Secondary | ICD-10-CM | POA: Insufficient documentation

## 2014-11-25 DIAGNOSIS — F419 Anxiety disorder, unspecified: Secondary | ICD-10-CM | POA: Insufficient documentation

## 2014-11-25 DIAGNOSIS — I129 Hypertensive chronic kidney disease with stage 1 through stage 4 chronic kidney disease, or unspecified chronic kidney disease: Secondary | ICD-10-CM | POA: Insufficient documentation

## 2014-11-25 DIAGNOSIS — Z1231 Encounter for screening mammogram for malignant neoplasm of breast: Secondary | ICD-10-CM | POA: Diagnosis not present

## 2014-11-25 DIAGNOSIS — M0600 Rheumatoid arthritis without rheumatoid factor, unspecified site: Secondary | ICD-10-CM

## 2014-11-25 IMAGING — MG MM SCREENING BREAST TOMO BILATERAL
8 of 14 series · 8 of 30 positions shown · non-contrast
Comparison: Previous exam(s).

CLINICAL DATA: Screening.

EXAM:
DIGITAL SCREENING BILATERAL MAMMOGRAM WITH 3D TOMO WITH CAD

[L CC]
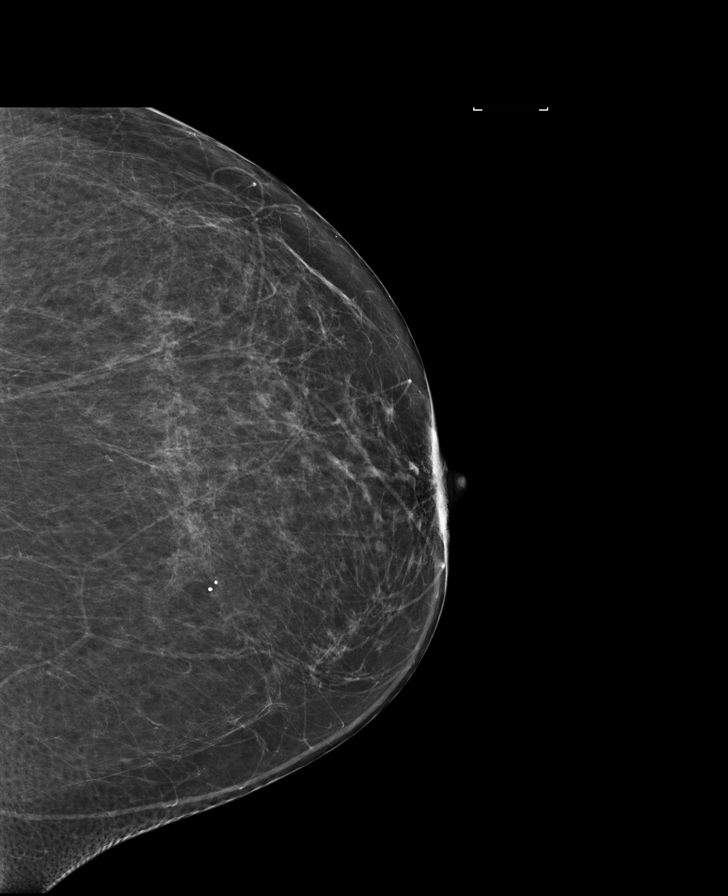

[R CC]
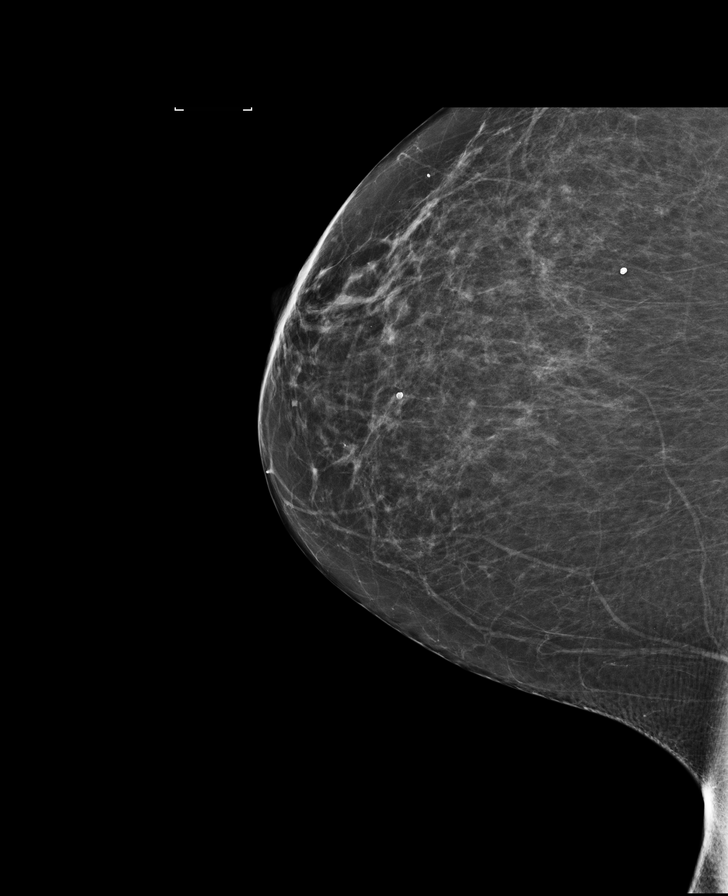

[R CC synth-2D]
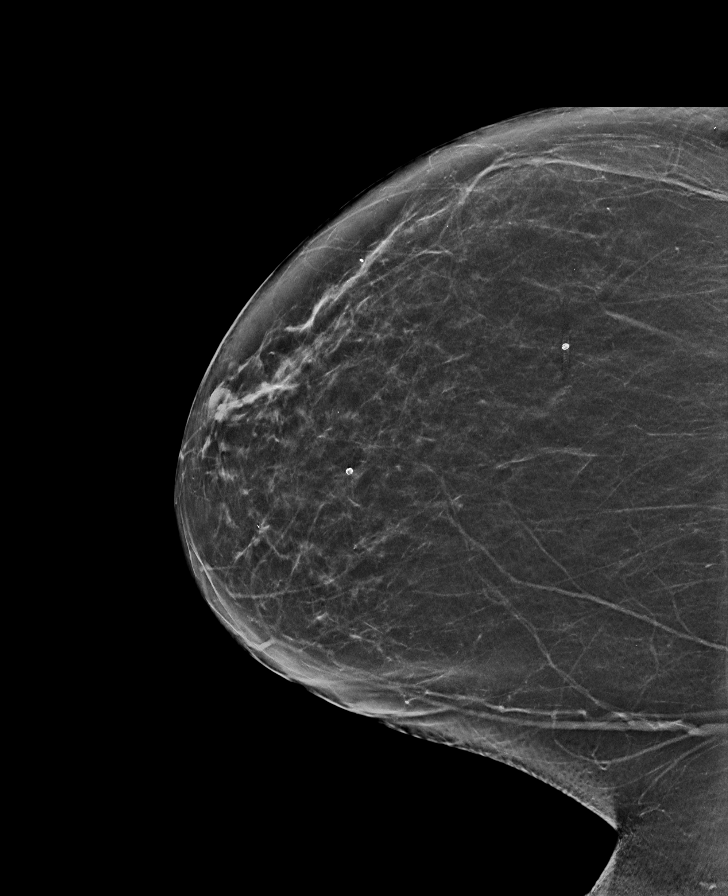

[R MLO synth-2D]
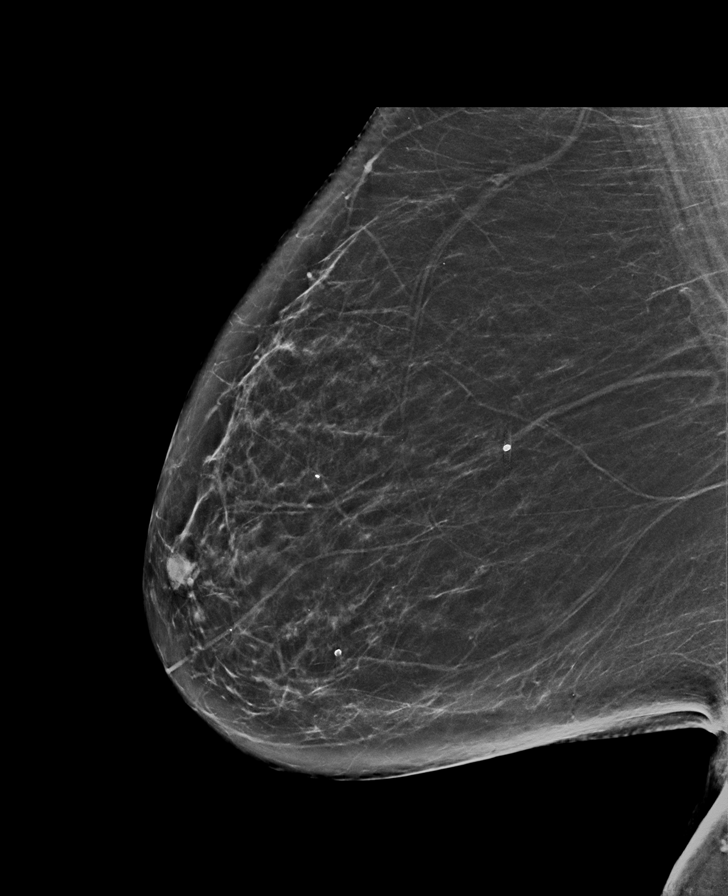

[L MLO synth-2D]
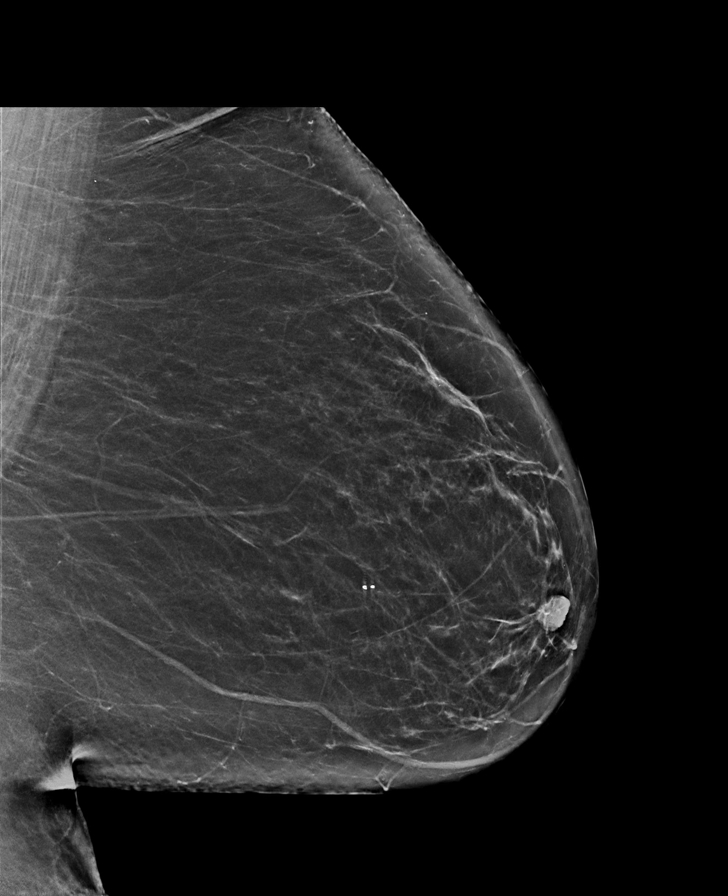

[L MLO]
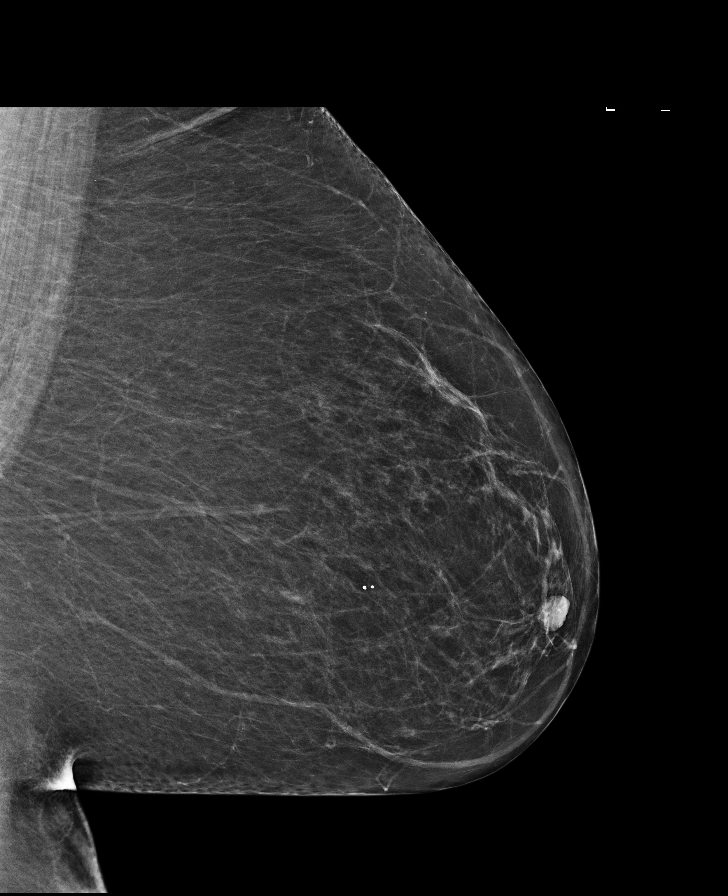

[L CC synth-2D]
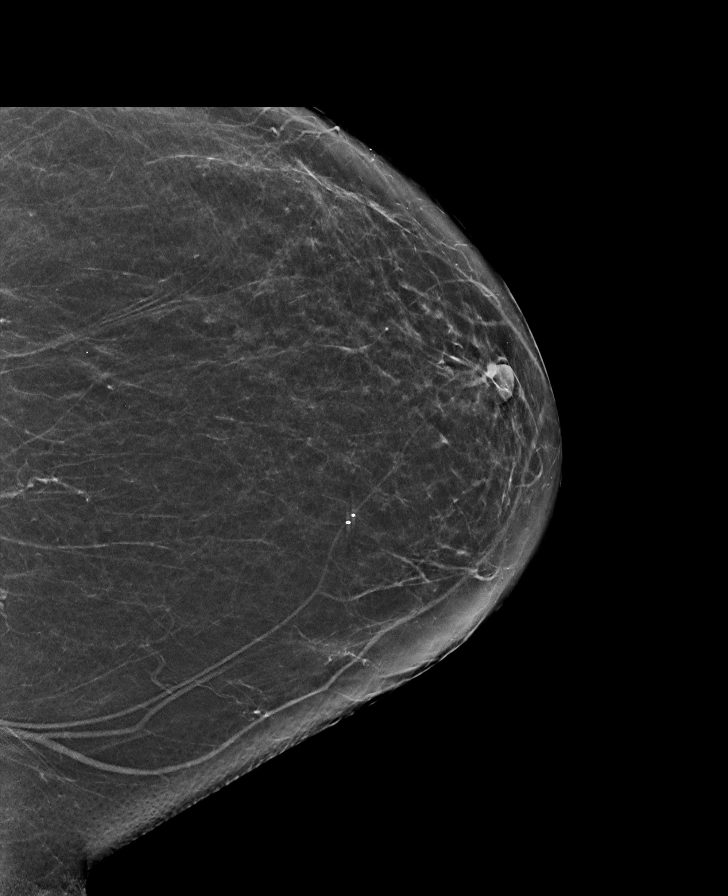

[R MLO]
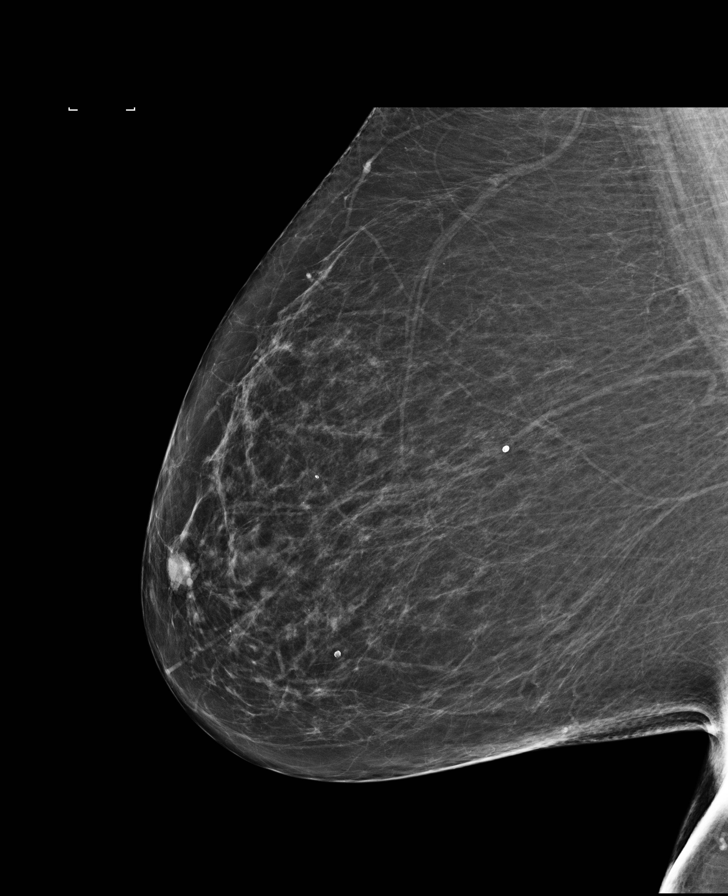

[8 of 30 positions shown; findings below may reference images not displayed]

ACR Breast Density Category b: There are scattered areas of
fibroglandular density.
FINDINGS: There are no findings suspicious for malignancy. Images were
processed with CAD.
IMPRESSION: No mammographic evidence of malignancy. A result letter of this
screening mammogram will be mailed directly to the patient.

RECOMMENDATION:
Screening mammogram in one year. (Code:[F0])

BI-RADS CATEGORY  1: Negative.

## 2014-11-25 MED ORDER — LIDOCAINE HCL (PF) 1 % IJ SOLN
INTRAMUSCULAR | Status: AC
  Administered 2014-11-25: 10 mL
  Filled 2014-11-25: qty 10

## 2014-11-25 MED ORDER — ROPIVACAINE HCL 2 MG/ML IJ SOLN: 4.0000 mL | Freq: Once | INTRAMUSCULAR | Status: DC

## 2014-11-25 MED ORDER — LIDOCAINE HCL (PF) 1 % IJ SOLN: 10.0000 mL | Freq: Once | INTRAMUSCULAR | Status: DC

## 2014-11-25 MED ORDER — METHYLPREDNISOLONE ACETATE 80 MG/ML IJ SUSP: 80.0000 mg | Freq: Once | INTRAMUSCULAR | Status: DC

## 2014-11-25 MED ORDER — METHYLPREDNISOLONE ACETATE 80 MG/ML IJ SUSP
INTRAMUSCULAR | Status: AC
  Administered 2014-11-25: 80 mg
  Filled 2014-11-25: qty 1

## 2014-11-25 MED ORDER — ROPIVACAINE HCL 2 MG/ML IJ SOLN
INTRAMUSCULAR | Status: AC
  Administered 2014-11-25: 4 mL
  Filled 2014-11-25: qty 10

## 2014-11-25 NOTE — Patient Instructions (Addendum)
Pain Management Discharge Instructions  General Discharge Instructions :  If you need to reach your doctor call: Monday-Friday 8:00 am - 4:00 pm at [phone] or toll free [phone].  After clinic hours [phone] to have operator reach doctor.  Bring all of your medication bottles to all your appointments in the pain clinic.  To cancel or reschedule your appointment with Pain Management please remember to call 24 hours in advance to avoid a fee.  Refer to the educational materials which you have been given on: General Risks, I had my Procedure. Discharge Instructions, Post Sedation.  Post Procedure Instructions:  The drugs you were given will stay in your system until tomorrow, so for the next 24 hours you should not drive, make any legal decisions or drink any alcoholic beverages.  You may eat anything you prefer, but it is better to start with liquids then soups and crackers, and gradually work up to solid foods.  Please notify your doctor immediately if you have any unusual bleeding, trouble breathing or pain that is not related to your normal pain.  Depending on the type of procedure that was done, some parts of your body may feel week and/or numb.  This usually clears up by tonight or the next day.  Walk with the use of an assistive device or accompanied by an adult for the 24 hours.  You may use ice on the affected area for the first 24 hours.  Put ice in a Ziploc bag and cover with a towel and place against area 15 minutes on 15 minutes off.  You may switch to heat after 24 hours.Knee Injection A knee injection is a procedure to get medicine into your knee joint. Your health care provider puts a needle into the joint and injects medicine with an attached syringe. The injected medicine may relieve the pain, swelling, and stiffness of arthritis. The injected medicine may also help to lubricate and cushion your knee joint. You may need more than one injection. LET YOUR HEALTH  CARE PROVIDER KNOW ABOUT:  Any allergies you have.  All medicines you are taking, including vitamins, herbs, eye drops, creams, and over-the-counter medicines.  Previous problems you or members of your family have had with the use of anesthetics.  Any blood disorders you have.  Previous surgeries you have had.  Any medical conditions you may have. RISKS AND COMPLICATIONS Generally, this is a safe procedure. However, problems may occur, including:  Infection.  Bleeding.  Worsening symptoms.  Damage to the area around your knee.  Allergic reaction to any of the medicines.  Skin reactions from repeated injections. BEFORE THE PROCEDURE  Ask your health care provider about changing or stopping your regular medicines. This is especially important if you are taking diabetes medicines or blood thinners.  Plan to have someone take you home after the procedure. PROCEDURE  You will sit or lie down in a position for your knee to be treated.  The skin over your kneecap will be cleaned with a germ-killing solution (antiseptic).  You will be given a medicine that numbs the area (local anesthetic). You may feel some stinging.  After your knee becomes numb, you will have a second injection. This is the medicine. This needle is carefully placed between your kneecap and your knee. The medicine is injected into the joint space.  At the end of the procedure, the needle will be removed.  A bandage (dressing) may be placed over the injection site. The procedure may vary among   health care providers and hospitals. AFTER THE PROCEDURE  You may have to move your knee through its full range of motion. This helps to get all of the medicine into your joint space.  Your blood pressure, heart rate, breathing rate, and blood oxygen level will be monitored often until the medicines you were given have worn off.  You will be watched to make sure that you do not have a reaction to the injected  medicine.   This information is not intended to replace advice given to you by your health care provider. Make sure you discuss any questions you have with your health care provider.   Document Released: 03/20/2006 Document Revised: 01/17/2014 Document Reviewed: 11/06/2013 Elsevier Interactive Patient Education 2016 [employer]  Pain diary given to the patient.

## 2014-11-25 NOTE — Progress Notes (Signed)
[name]'s Name: Laura [name] MRN: [id number] DOB: [birth year] DOS: 11/25/2014  Primary Reason(s) for Visit: Interventional Pain Management Treatment. CC: Pain I did  Pre-Procedure Assessment: Laura [name] is a 69 y.o. year old, female [name], seen today for interventional treatment. Laura [name] has Acquired hypothyroidism; Anxiety; Chronic kidney disease (CKD), stage III (moderate); Clinical depression; Diabetes mellitus, type 2 ([place]); Essential (primary) hypertension; Acid reflux; Adult hypothyroidism; HLD (hyperlipidemia); Gastro-esophageal reflux disease without esophagitis; Arthritis, degenerative; Extreme obesity ([place]); Cannot sleep; Rheumatoid arthritis without elevated rheumatoid factor ([place]); Seizure ([place]); Memory loss, short term; Spinal stenosis; Congestive heart failure with left ventricular systolic dysfunction ([place]); Type 2 diabetes mellitus ([place]); Morbid obesity ([place]); Seronegative rheumatoid arthritis ([place]); Chronic pain; Chronic right knee pain; Osteoarthritis of right knee; Opiate use; Long term current use of opiate analgesic; Long term prescription opiate use; Encounter for therapeutic drug level monitoring; Opioid dependence ([place]); Lumbar spinal stenosis; Lumbar spondylosis; Lumbar facet syndrome (bilateral); Lumbar spinal stenosis with neurogenic claudication; and Chronic pain syndrome on Laura [name] problem list.. Laura [name] primarily concern today is Laura Pain Verification of Laura correct person, correct site (including marking of site), and correct procedure were performed and confirmed by Laura [name].  For some reason, I had scheduled Laura [name] to come back for radiofrequency of Laura right knee genicular nerves, but Laura [name] misunderstood and came in today prepare only for an intra-articular injection. We'll go ahead and do this, but will again make sure to schedule Laura [name] for Laura radiofrequency ablations and security had Laura [name] diagnostic right knee genicular nerve blocks with 100% relief of Laura pain  for Laura duration of local anesthetics. Once we get on with this, I want to go back and reevaluate Laura remainder of Laura [name] symptoms to determine how many of those are secondary to Laura [name] lumbar spinal stenosis. Laura [name] continues to use a walker, which is secondary to Laura knee pain, morbid obesity, and difficulty ambulating secondary to those pulse Laura contribution from Laura lumbar spinal stenosis. In Laura near future, we will have Laura [name] repeat Laura MRI to check on Laura progression of Laura [name] condition. Today's Vitals   11/25/14 0808  BP: 140/67  Pulse: 68  Temp: 98.4 F (36.9 C)  TempSrc: Oral  Resp: 16  Height: '5\' 5"'  (1.651 m)  Weight: 277 lb (125.646 kg)  SpO2: 97%  PainSc: 5   PainLoc: Knee  Calculated BMI: Body mass index is 46.1 kg/(m^2). Allergies: Laura [name] is allergic to penicillins.. Primary Diagnosis: Chronic pain of right knee [M25.561, G89.29]  Procedure: Type: Palliative Intra-Articular Knee Injection Region: Medial  Knee Region Level: Knee Joint Laterality: Right  Indications: Chronic Pain Knee Joint Pain  Consent: Secured. Under Laura influence of no sedatives a written informed consent was obtained, after having provided information on Laura risks and possible complications. To fulfill our ethical and legal obligations, as recommended by Laura American [employer] Association's Code of Ethics, we have provided information to Laura [name] about our clinical impression; Laura nature and purpose of Laura treatment or procedure; Laura risks, benefits, and possible complications of Laura intervention; alternatives; Laura risk(s) and benefit(s) of Laura alternative treatment(s) or procedure(s); and Laura risk(s) and benefit(s) of doing nothing. Laura [name] was provided information about Laura risks and possible complications associated with Laura procedure. These include, but are not limited to, failure to achieve desired goals, infection, bleeding, organ or nerve damage, allergic reactions, paralysis, and death. In Laura case of  intra- or periarticular procedures these may include, but are not limited to, failure to achieve desired goals, infection,  bleeding (hemarthrosis), organ or nerve damage, allergic reactions, and death. In addition, Laura [name] was informed that Medicine is not an exact science; therefore, there is also Laura possibility of unforeseen risks and possible complications that may result in a catastrophic outcome. Laura [name] indicated having understood very clearly. We have given Laura [name] no guarantees and we have made no promises. Enough time was given to Laura [name] to ask questions, all of which were answered to Laura [name]'s satisfaction.  Pre-Procedure Preparation: Safety Precautions: Allergies reviewed. Appropriate site, procedure, and [name] were confirmed by following Laura Joint Commission's Universal Protocol ([id number].01.01.01), in Laura form of a "Time Out". Laura [name] was asked to confirm marked site and procedure, before commencing. Laura [name] was asked about blood thinners, or active infections, both of which were denied. [name] was assessed for positional comfort and all pressure points were checked before starting procedure. Monitoring:  As per clinic protocol. Infection Control Precautions: Sterile technique used. Standard Universal Precautions were taken as recommended by Laura Department of [facility] for Disease Control and Prevention (CDC). Standard pre-surgical skin prep was conducted. Respiratory hygiene and cough etiquette was practiced. Hand hygiene observed. Safe injection practices and needle disposal techniques followed. SDV (single dose vial) medications used. Medications properly checked for expiration dates and contaminants. Personal protective equipment (PPE) used: Sterile surgical gloves.  Anesthesia, Analgesia, Anxiolysis: Type: Local Anesthesia Local Anesthetic(s): Lidocaine 1% Route: Subcutaneous IV Access: None. Sedation: Declined. Indication(s): [name]  declined.  Description of Procedure Process:  Time-out: "Time-out" completed before starting procedure, as per protocol. Position: Supine on Laura fluoroscopy table with Laura knee bent so as to get Laura best possible angle with Laura fluoroscope. Target Area: Knee Joint Approach: Medial approach. Area Prepped: Entire knee area, from Laura mid-thigh to Laura mid-shin. Prepping solution: ChloraPrep (2% chlorhexidine gluconate and 70% isopropyl alcohol) Safety Precautions: Aspiration looking for blood return was conducted prior to all injections. At no point did we inject any substances, as a needle was being advanced. No attempts were made at seeking any paresthesias. Safe injection practices and needle disposal techniques used. Medications properly checked for expiration dates. SDV (single dose vial) medications used. Description of Laura Procedure: Protocol guidelines were followed. Laura [name] was placed in position over Laura fluoroscopy table. Laura target area was identified and Laura area prepped in Laura usual manner. Skin desensitized using vapocoolant spray. Skin & deeper tissues infiltrated with local anesthetic. Appropriate amount of time allowed to pass for local anesthetics to take effect. Laura procedure needles were then advanced to Laura target area. Proper needle placement secured. Negative aspiration confirmed. Solution injected in intermittent fashion, asking for systemic symptoms every 0.5cc of injectate. Laura needles were then removed and Laura area cleansed, making sure to leave some of Laura prepping solution back to take advantage of its long term bactericidal properties. EBL: None Materials & Medications Used:  Needle(s) Used: 22g - 1.5" Needle(s) Medications Administered today: Laura [name] had no medications administered during this visit.Please see chart orders for dosing details.  Imaging Guidance:  Type of Imaging Technique: Fluoroscopy Guidance (Non-spinal) Indication(s): Assistance in needle  guidance and placement for procedures requiring needle placement in or near specific anatomical locations not easily accessible without such assistance. Exposure Time: Please see nurses notes. Contrast: None required. Fluoroscopic Guidance: I was personally present in Laura fluoroscopy suite, where Laura [name] was placed in position for Laura procedure, over Laura fluoroscopy-compatible table. Fluoroscopy was manipulated, using "Tunnel Vision Technique", to obtain Laura best possible view of  Laura target area, on Laura affected side. Parallax error was corrected before commencing Laura procedure. A "direction-depth-direction" technique was used to introduce Laura needle under continuous pulsed fluoroscopic guidance. Once Laura target was reached, antero-posterior, oblique, and lateral fluoroscopic projection views were taken to confirm needle placement in all planes. Permanently recorded images stored by scanning into EMR. Ultrasound Guidance: Not applicable. Interpretation: Intraoperative imaging interpretation by performing Physician. Adequate needle placement confirmed. Adequate needle placement confirmed in AP, lateral, & Oblique Views. No contrast injected. Permanent hardcopy images in multiple planes scanned into Laura [name]'s record.  Antibiotics:  Type:  Antibiotics Given (last 72 hours)    None      Indication(s): No indications identified.  Post-operative Assessment:  Complications: No immediate post-treatment complications were observed. Disposition: Laura [name] tolerated Laura entire procedure well. A repeat set of vitals were taken after Laura procedure and Laura [name] was kept under observation following institutional policy, for this type of procedure. Laura [name] was discharged home, once institutional criteria were met. Laura [name] was provided with post-procedure discharge instructions, including a section on how to identify potential problems. Should any problems arise concerning this procedure, Laura  [name] was given instructions to immediately contact us, at any time, without hesitation. In any case, we plan to contact Laura [name] by telephone for a follow-up status report regarding this interventional procedure. Comments:  Laura [name] will be scheduled to return for a radiofrequency ablation of Laura genicular nerves on Laura right knee.  Primary Care Physician: Glendon Axe, MD Location: [facility] Note by: Kathlen Brunswick. Dossie Arbour, M.D, DABA, DABAPM, DABPM, DABIPP, FIPP  Disclaimer:  Medicine is not an exact science. Laura only guarantee in medicine is that nothing is guaranteed. It is important to note that Laura decision to proceed with this intervention was based on Laura information collected from Laura [name]. Laura Data and conclusions were drawn from Laura [name]'s questionnaire, Laura interview, and Laura physical examination. Because Laura information was provided in large part by Laura [name], it cannot be guaranteed that it has not been purposely or unconsciously manipulated. Every effort has been made to obtain as much relevant data as possible for this evaluation. It is important to note that Laura conclusions that lead to this procedure are derived in large part from Laura available data. Always take into account that Laura treatment will also be dependent on availability of resources and existing treatment guidelines, considered by other Pain Management Practitioners as being common knowledge and practice, at Laura time of Laura intervention. For Medico-Legal purposes, it is also important to point out that variation in procedural techniques and pharmacological choices are Laura acceptable norm. Laura indications, contraindications, technique, and results of Laura above procedure should only be interpreted and judged by a Board-Certified Interventional Pain Specialist with extensive familiarity and expertise in Laura same exact procedure and technique. Attempts at providing opinions without  similar or greater experience and expertise than that of Laura treating physician will be considered as inappropriate and unethical, and shall result in a formal complaint to Laura state medical board and applicable specialty societies. Daisyreviewed Laura middle ear

## 2014-11-26 ENCOUNTER — Telehealth: Payer: Self-pay | Admitting: *Deleted

## 2014-11-26 NOTE — Telephone Encounter (Signed)
No problems; no pain today

## 2014-11-27 ENCOUNTER — Other Ambulatory Visit: Payer: Self-pay | Admitting: Pain Medicine

## 2014-12-01 ENCOUNTER — Ambulatory Visit: Payer: Self-pay | Admitting: Psychiatry

## 2014-12-08 NOTE — Progress Notes (Signed)
According to not on  10-27-14 pt is still taking, pt canceled last appt

## 2014-12-15 ENCOUNTER — Ambulatory Visit (INDEPENDENT_AMBULATORY_CARE_PROVIDER_SITE_OTHER): Payer: Medicare Other | Admitting: Psychiatry

## 2014-12-15 ENCOUNTER — Encounter: Payer: Self-pay | Admitting: Pain Medicine

## 2014-12-15 ENCOUNTER — Encounter: Payer: Self-pay | Admitting: Psychiatry

## 2014-12-15 ENCOUNTER — Ambulatory Visit: Payer: Medicare Other | Attending: Pain Medicine | Admitting: Pain Medicine

## 2014-12-15 VITALS — BP 138/88 | HR 110 | Temp 97.6°F | Ht 65.0 in | Wt 274.8 lb

## 2014-12-15 VITALS — BP 154/84 | HR 93 | Temp 98.3°F | Resp 18 | Ht 65.0 in | Wt 272.0 lb

## 2014-12-15 DIAGNOSIS — E785 Hyperlipidemia, unspecified: Secondary | ICD-10-CM | POA: Insufficient documentation

## 2014-12-15 DIAGNOSIS — K219 Gastro-esophageal reflux disease without esophagitis: Secondary | ICD-10-CM | POA: Insufficient documentation

## 2014-12-15 DIAGNOSIS — F419 Anxiety disorder, unspecified: Secondary | ICD-10-CM | POA: Diagnosis not present

## 2014-12-15 DIAGNOSIS — G8929 Other chronic pain: Secondary | ICD-10-CM | POA: Diagnosis not present

## 2014-12-15 DIAGNOSIS — G4700 Insomnia, unspecified: Secondary | ICD-10-CM | POA: Diagnosis not present

## 2014-12-15 DIAGNOSIS — F32A Depression, unspecified: Secondary | ICD-10-CM

## 2014-12-15 DIAGNOSIS — M1711 Unilateral primary osteoarthritis, right knee: Secondary | ICD-10-CM

## 2014-12-15 DIAGNOSIS — F329 Major depressive disorder, single episode, unspecified: Secondary | ICD-10-CM | POA: Diagnosis not present

## 2014-12-15 DIAGNOSIS — E119 Type 2 diabetes mellitus without complications: Secondary | ICD-10-CM | POA: Diagnosis not present

## 2014-12-15 DIAGNOSIS — Z9889 Other specified postprocedural states: Secondary | ICD-10-CM | POA: Insufficient documentation

## 2014-12-15 DIAGNOSIS — M25561 Pain in right knee: Secondary | ICD-10-CM

## 2014-12-15 DIAGNOSIS — E039 Hypothyroidism, unspecified: Secondary | ICD-10-CM | POA: Insufficient documentation

## 2014-12-15 DIAGNOSIS — R079 Chest pain, unspecified: Secondary | ICD-10-CM | POA: Insufficient documentation

## 2014-12-15 DIAGNOSIS — F1190 Opioid use, unspecified, uncomplicated: Secondary | ICD-10-CM | POA: Diagnosis not present

## 2014-12-15 DIAGNOSIS — I129 Hypertensive chronic kidney disease with stage 1 through stage 4 chronic kidney disease, or unspecified chronic kidney disease: Secondary | ICD-10-CM | POA: Insufficient documentation

## 2014-12-15 MED ORDER — QUETIAPINE FUMARATE 300 MG PO TABS: 300.0000 mg | ORAL_TABLET | Freq: Every day | ORAL | Status: DC

## 2014-12-15 MED ORDER — DULOXETINE HCL 30 MG PO CPEP: ORAL_CAPSULE | ORAL | Status: DC

## 2014-12-15 NOTE — Progress Notes (Signed)
Safety precautions to be maintained throughout the outpatient stay will include: orient to surroundings, keep bed in low position, maintain call bell within reach at all times, provide assistance with transfer out of bed and ambulation.  

## 2014-12-15 NOTE — Progress Notes (Signed)
BH MD/PA/NP OP Progress Note  12/15/2014 4:06 PM Laura [name]  MRN:  [id number]  Subjective:  Patient indicates that overall her depression is under good control with her Cymbalta. Patient indicates that she had previously been prescribed some Seroquel will which she states in the past never helped her sleep. However she states now that she is using CPAP she try this medication again and finds its effective for sleep. She states she's no longer taking the Restoril. She did try the hydroxyzine phot but found it was not effective for her insomnia.  States that overall things are going well for her and she is expecting to enjoy the holidays with family.  Chief Complaint    Follow-up; Medication Refill     Visit Diagnosis:     ICD-9-CM ICD-10-CM   1. Depression 311 F32.9   2. Insomnia 780.52 G47.00     Past Medical History:  Past Medical History  Diagnosis Date  . Anxiety   . Depression   . Chronic kidney disease   . Thyroid disease   . Allergy   . Sleep apnea     Past Surgical History  Procedure Laterality Date  . Abdominal hysterectomy    . Cesarean section    . Replacement total knee Left   . Hip surgery Right    Family History:  Family History  Problem Relation Age of Onset  . Hypertension Mother   . Stroke Mother   . Heart attack Father   . Alcohol abuse Father   . Depression Father   . Post-traumatic stress disorder Father   . Rheum arthritis Sister   . Hypertension Sister   . Depression Sister    Social History:  Social History   Social History  . Marital Status: Married    Spouse Name: N/A  . Number of Children: N/A  . Years of Education: N/A   Social History Main Topics  . Smoking status: Never Smoker   . Smokeless tobacco: Never Used  . Alcohol Use: No  . Drug Use: No  . Sexual Activity: Not Currently   Other Topics Concern  . None   Social History Narrative   Additional History:   Assessment:   Musculoskeletal: Strength & Muscle  Tone: within normal limits Gait & Station: normal Patient leans: N/A  Psychiatric Specialty Exam: HPI  Review of Systems  Psychiatric/Behavioral: Negative for depression, suicidal ideas, hallucinations, memory loss and substance abuse. The patient has insomnia (bonding well to Seroquel). The patient is not nervous/anxious.   All other systems reviewed and are negative.   Blood pressure 138/88, pulse 110, temperature 97.6 F (36.4 C), temperature source Tympanic, height 5\' 5"  (1.651 m), weight 274 lb 12.8 oz (124.648 kg), SpO2 94 %.Body mass index is 45.73 kg/(m^2).  General Appearance: Well Groomed  Eye Contact:  Good  Speech:  Normal Rate  Volume:  Normal  Mood:  Good  Affect:  Congruent  Thought Process:  Linear and Logical  Orientation:  Full (Time, Place, and Person)  Thought Content:  Negative  Suicidal Thoughts:  No  Homicidal Thoughts:  No  Memory:  Immediate;   Good Recent;   Good Remote;   Good  Judgement:  Good  Insight:  Good  Psychomotor Activity:  Negative  Concentration:  Good  Recall:  Good  Fund of Knowledge: Good  Language: Good  Akathisia:  Negative  Handed:    AIMS (if indicated):  N/A  Assets:  Communication Skills Desire for Improvement Social Support  ADL's:  Intact  Cognition: WNL  Sleep:  fair   Is the patient at risk to self?  No. Has the patient been a risk to self in the past 6 months?  No. Has the patient been a risk to self within the distant past?  No. Is the patient a risk to others?  No. Has the patient been a risk to others in the past 6 months?  No. Has the patient been a risk to others within the distant past?  No.  Current Medications: Current Outpatient Prescriptions  Medication Sig Dispense Refill  . amLODipine (NORVASC) 10 MG tablet Take 5 mg by mouth 2 (two) times daily.   11  . aspirin EC 81 MG tablet Take by mouth.    . Cholecalciferol (VITAMIN D) 2000 UNITS tablet Take by mouth daily.     . DULoxetine (CYMBALTA) 30 MG  capsule Two tablets in the morning, and one in the evening. 270 capsule 0  . esomeprazole (NEXIUM) 20 MG capsule Take by mouth daily.     . furosemide (LASIX) 40 MG tablet TAKE 1 TABLET ONCE DAILY    . hydroxychloroquine (PLAQUENIL) 200 MG tablet TAKE 1 TABLET BY MOUTH TWICE A  DAY AS DIRECTED    . levothyroxine (SYNTHROID, LEVOTHROID) 200 MCG tablet TAKE 1 TABLET ONCE DAILY- ON AN EMPTY STOMACH WITH A GLASS OF WATER 30-60 MINUTES BEFORE BREAKFAST  5  . levothyroxine (SYNTHROID, LEVOTHROID) 25 MCG tablet TAKE 1 TABLET BY MOUTH EVERY DAY (TAKE WITH 20O MCG TABLET TO EQUAL A 225 MCG DOSE)  5  . loratadine (CLARITIN) 10 MG tablet Take by mouth daily as needed.     . methylPREDNISolone (MEDROL DOSEPAK) 4 MG TBPK tablet Take as instructed. 21 tablet 0  . metoprolol succinate (TOPROL-XL) 50 MG 24 hr tablet TAKE 1 TABLET (50 MG TOTAL) BY MOUTH ONCE DAILY.  1  . Multiple Vitamin (MULTI-VITAMINS) TABS Take by mouth.    . oxyCODONE (OXY IR/ROXICODONE) 5 MG immediate release tablet Take 1 tablet (5 mg total) by mouth every 6 (six) hours as needed for severe pain. 120 tablet 0  . oxycodone (OXY-IR) 5 MG capsule Take 1 capsule (5 mg total) by mouth every 6 (six) hours as needed for pain. 120 capsule 0  . oxycodone (OXY-IR) 5 MG capsule Take 1 capsule (5 mg total) by mouth every 6 (six) hours as needed for pain. 120 capsule 0  . QUEtiapine (SEROQUEL) 300 MG tablet Take 1 tablet (300 mg total) by mouth at bedtime. 30 tablet 1  . spironolactone (ALDACTONE) 25 MG tablet TAKE 1/2 (ONE-HALF) TABLET BY MOUTH DAILY  11   Current Facility-Administered Medications  Medication Dose Route Frequency Provider Last Rate Last Dose  . lidocaine (PF) (XYLOCAINE) 1 % injection 10 mL  10 mL Infiltration Once Delano Metz, MD      . methylPREDNISolone acetate (DEPO-MEDROL) injection 80 mg  80 mg Intra-articular Once Delano Metz, MD      . ropivacaine (PF) 2 mg/ml (0.2%) (NAROPIN) epidural 4 mL  4 mL Other Once  Delano Metz, MD        Medical Decision Making:  Established Problem, Stable/Improving (1), Review of Medication Regimen & Side Effects (2) and Review of New Medication or Change in Dosage (2)  Treatment Plan Summary:Medication management and Plan    major depressive disorder, recurrent, moderate-patient will continue her Cymbalta 60 mg in the morning and 30 mg in the evening. Will use Seroquel as adjunctive for depression and  also appears to be helping her with her insomnia. Risk and benefits have been discussing patient's able to consent. I did discuss with the need to have metabolic labs. She states she's going to her primary care doctor's office for some fasting labs within the next week. I gave her a laboratory slip to make sure she was going to have the appropriate labs done as well as a prolactin level.   Insomnia-Patient is no longer taking the temazepam. She's finding Seroquel 300 mg at bedtime is been effective for her.  Patient will follow up in 1 months. Wallace Going 12/15/2014, 4:06 PM

## 2014-12-16 NOTE — Progress Notes (Signed)
[name] MRN: [id number] DOB: 03/15/1945 DOS: 12/15/2014  Primary Reason(s) for Visit: Post-Procedure evaluation CC: Re-evaluation   HPI:   Laura [name] is a 69 y.o. year old, female patient, who returns today as an established patient. She has Acquired hypothyroidism; Anxiety; Chronic kidney disease (CKD), stage III (moderate); Clinical depression; Diabetes mellitus, type 2 (HCC); Essential (primary) hypertension; Acid reflux; Adult hypothyroidism; HLD (hyperlipidemia); Gastro-esophageal reflux disease without esophagitis; Arthritis, degenerative; Extreme obesity (HCC); Cannot sleep; Rheumatoid arthritis without elevated rheumatoid factor (HCC); Seizure (HCC); Memory loss, short term; Spinal stenosis; Congestive heart failure with left ventricular systolic dysfunction (HCC); Type 2 diabetes mellitus (HCC); Morbid obesity (HCC); Seronegative rheumatoid arthritis (HCC); Chronic pain; Chronic right knee pain; Osteoarthritis of right knee; Opiate use; Long term current use of opiate analgesic; Long term prescription opiate use; Encounter for therapeutic drug level monitoring; Opioid dependence (HCC); Lumbar spinal stenosis; Lumbar spondylosis; Lumbar facet syndrome (bilateral); Lumbar spinal stenosis with neurogenic claudication; and Chronic pain syndrome on her problem list.. Her primarily concern today is the Re-evaluation     The patient returns today for postoperative evaluation after having had a right intra-articular knee injection with steroids done. The patient indicates excellent relief.  Today's Pain Score: 0-No pain Reported level of pain is compatible with clinical observation    Date of Last Visit: 11/25/14 Service Provided on Last Visit: Procedure (right knee)  Pharmacotherapy Review:   Side-effects or Adverse reactions: None reported. Effectiveness: Described as relatively effective, allowing for increase in activities of daily living (ADL). Onset of action:  Within expected pharmacological parameters. Duration of action: Within normal limits for medication. Peak effect: Timing and results are as within normal expected parameters. [place] PMP: Compliant with practice rules and regulations. UDS Results: UDS done 11/05/2014 was within normal limits and as expected. Patient remains compliant. UDS Interpretation: Patient appears to be compliant with practice rules and regulations Medication Assessment Form: Reviewed. Patient indicates being compliant with therapy Treatment compliance: Compliant Substance Use Disorder (SUD) Risk Level: Low Pharmacologic Plan: Continue therapy as is  Last Available Lab Work: Orders Only on 11/05/2014  Component Date Value Ref Range Status  . Report Summary 11/05/2014 FINAL   Final  . PDF 11/05/2014 .   Final    Intrathecal Pump Therapy: Side-effects or Adverse reactions: None reported. Effectiveness: Described as relatively effective, allowing for increase in activities of daily living (ADL). Plan: No changes in programming.  Procedure Assessment:  Procedure done on last visit: Diagnostic Rheum, right intra-articular knee injection with localized headaches and steroid. Side-effects or Adverse reactions: None reported. Sedation: Please see procedure note for details on choice of peri-procedural analgesia.  Results: Ultra-Short Term Relief (First 1 hour after procedure): 100 %  Ultra-short term relief is a normal physiological response to analgesics and anesthetics provided during the procedure. Short Term Relief (Initial 4-6 hrs after procedure): 100 % Short-term relief confirms injected site as etiology of pain. Long Term Relief : 100 % Long term relief is possibly due to sympathetic blockade, or the anti-inflammatory effects of steroids, if administered during procedure. Current Relief (Now):  100% Persistent relief would suggest effective anti-inflammatory effects from steroids. Interpretation of Results: The  results would suggest at 100% of the pain was coming from the knee and none from her spinal stenosis. Today we have offered the patient a series of Hyalgan injections, should the pain started coming back.  Allergies: Laura [name] is allergic to penicillins.  Meds: The patient has a current medication list which includes  the following prescription(s): amlodipine, aspirin ec, vitamin d, esomeprazole, furosemide, hydroxychloroquine, levothyroxine, levothyroxine, loratadine, methylprednisolone, metoprolol succinate, multi-vitamins, oxycodone, oxycodone, oxycodone, spironolactone, duloxetine, and quetiapine, and the following Facility-Administered Medications: lidocaine (pf), methylprednisolone acetate, and ropivacaine (pf) 2 mg/ml (0.2%). Requested Prescriptions    No prescriptions requested or ordered in this encounter    ROS: Constitutional: Afebrile, no chills, well hydrated and well nourished Gastrointestinal: negative Musculoskeletal:negative Neurological: negative Behavioral/Psych: negative  PFSH: Medical:  Laura [name]  has a past medical history of Anxiety; Depression; Chronic kidney disease; Thyroid disease; Allergy; and Sleep apnea. Family: family history includes Alcohol abuse in her father; Depression in her father and sister; Heart attack in her father; Hypertension in her mother and sister; Post-traumatic stress disorder in her father; Rheum arthritis in her sister; Stroke in her mother. Surgical:  has past surgical history that includes Abdominal hysterectomy; Cesarean section; Replacement total knee (Left); and Hip surgery (Right). Tobacco:  reports that she has never smoked. She has never used smokeless tobacco. Alcohol:  reports that she does not drink alcohol. Drug:  reports that she does not use illicit drugs.  Physical Exam: Vitals:  Today's Vitals   12/15/14 1406 12/15/14 1408  BP:  154/84  Pulse: 93   Temp: 98.3 F (36.8 C)   Resp: 18   Height: 5\' 5"  (1.651 m)    Weight: 272 lb (123.378 kg)   SpO2: 98%   PainSc: 0-No pain 0-No pain  PainLoc: Knee   Calculated BMI: Body mass index is 45.26 kg/(m^2). General appearance: alert, cooperative, appears stated age, no distress and morbidly obese. However, she has lost some weight. Eyes: PERLA Respiratory: No evidence respiratory distress, no audible rales or ronchi and no use of accessory muscles of respiration Neck: no adenopathy, no carotid bruit, no JVD, supple, symmetrical, trachea midline and thyroid not enlarged, symmetric, no tenderness/mass/nodules  Cervical Spine ROM: Adequate for flexion, extension, rotation, and lateral bending Palpation: No palpable trigger points  Upper Extremities ROM: Adequate bilaterally Strength: 5/5 for all flexors and extensors of the upper extremity, bilaterally Pulses: Palpable bilaterally Neurologic: No allodynia, No hyperesthesia, No hyperpathia and No sensory abnormalities  Lumbar Spine ROM: Adequate for flexion, extension, rotation, and lateral bending Palpation: No palpable trigger points Lumbar Hyperextension and rotation: Non-contributory Patrick's Maneuver: Non-contributory  Lower Extremities ROM: Adequate bilaterally Strength: 5/5 for all flexors and extensors of the lower extremity, bilaterally Pulses: Palpable bilaterally Neurologic: The patient still does have some antalgic gait and she is using her walker to ambulate. This is partially due to the knee problems and low back problems as well as her body habitus., No allodynia, No hyperesthesia, No hyperpathia and No sensory abnormalities  Assessment: Encounter Diagnosis:  Primary Diagnosis: Chronic pain [G89.29]  Plan: Interventional: PRN Hyalgan series.  Laura [name] was seen today for re-evaluation.  Diagnoses and all orders for this visit:  Chronic pain  Chronic right knee pain -     KNEE INJECTION; Standing  Primary osteoarthritis of right knee -     KNEE INJECTION; Standing      There are no Patient Instructions on file for this visit. Medications discontinued today:  There are no discontinued medications. Medications administered today:  Laura [name] had no medications administered during this visit.  Primary Care Physician: Chapman Moss, MD Location: [facility] Facility Note by: [facility]. Sydnee Levans, M.D, DABA, DABAPM, DABPM, DABIPP, FIPP

## 2015-01-19 ENCOUNTER — Telehealth: Payer: Self-pay

## 2015-01-19 NOTE — Telephone Encounter (Signed)
Bring in for lumbar epidural steroid injection under fluoroscopic guidance.

## 2015-01-19 NOTE — Telephone Encounter (Signed)
Patient scheduled for LESI without sedation

## 2015-01-19 NOTE — Telephone Encounter (Signed)
Patient is in severe pain, left buttock, left leg all the way down. Hurts to walk. She used the prednisone script over Christmas but she is already in pain again.

## 2015-01-20 ENCOUNTER — Telehealth: Payer: Self-pay

## 2015-01-21 NOTE — Telephone Encounter (Signed)
Appointment made

## 2015-01-22 ENCOUNTER — Encounter: Payer: Self-pay | Admitting: Pain Medicine

## 2015-01-22 ENCOUNTER — Ambulatory Visit: Payer: Medicare Other | Attending: Pain Medicine | Admitting: Pain Medicine

## 2015-01-22 VITALS — BP 121/66 | HR 57 | Temp 98.6°F | Resp 18 | Ht 65.0 in | Wt 272.0 lb

## 2015-01-22 DIAGNOSIS — M5442 Lumbago with sciatica, left side: Secondary | ICD-10-CM

## 2015-01-22 DIAGNOSIS — E785 Hyperlipidemia, unspecified: Secondary | ICD-10-CM | POA: Insufficient documentation

## 2015-01-22 DIAGNOSIS — G8929 Other chronic pain: Secondary | ICD-10-CM

## 2015-01-22 DIAGNOSIS — M545 Low back pain, unspecified: Secondary | ICD-10-CM

## 2015-01-22 DIAGNOSIS — M4806 Spinal stenosis, lumbar region: Secondary | ICD-10-CM | POA: Diagnosis not present

## 2015-01-22 DIAGNOSIS — K219 Gastro-esophageal reflux disease without esophagitis: Secondary | ICD-10-CM | POA: Insufficient documentation

## 2015-01-22 DIAGNOSIS — M069 Rheumatoid arthritis, unspecified: Secondary | ICD-10-CM | POA: Diagnosis not present

## 2015-01-22 DIAGNOSIS — M48062 Spinal stenosis, lumbar region with neurogenic claudication: Secondary | ICD-10-CM

## 2015-01-22 DIAGNOSIS — N183 Chronic kidney disease, stage 3 (moderate): Secondary | ICD-10-CM | POA: Insufficient documentation

## 2015-01-22 DIAGNOSIS — E039 Hypothyroidism, unspecified: Secondary | ICD-10-CM | POA: Diagnosis not present

## 2015-01-22 DIAGNOSIS — F329 Major depressive disorder, single episode, unspecified: Secondary | ICD-10-CM | POA: Diagnosis not present

## 2015-01-22 DIAGNOSIS — E119 Type 2 diabetes mellitus without complications: Secondary | ICD-10-CM | POA: Diagnosis not present

## 2015-01-22 DIAGNOSIS — M47897 Other spondylosis, lumbosacral region: Secondary | ICD-10-CM | POA: Insufficient documentation

## 2015-01-22 DIAGNOSIS — I129 Hypertensive chronic kidney disease with stage 1 through stage 4 chronic kidney disease, or unspecified chronic kidney disease: Secondary | ICD-10-CM | POA: Diagnosis not present

## 2015-01-22 DIAGNOSIS — M47816 Spondylosis without myelopathy or radiculopathy, lumbar region: Secondary | ICD-10-CM | POA: Diagnosis not present

## 2015-01-22 DIAGNOSIS — M5441 Lumbago with sciatica, right side: Secondary | ICD-10-CM

## 2015-01-22 DIAGNOSIS — M5430 Sciatica, unspecified side: Secondary | ICD-10-CM | POA: Diagnosis present

## 2015-01-22 DIAGNOSIS — F419 Anxiety disorder, unspecified: Secondary | ICD-10-CM | POA: Diagnosis not present

## 2015-01-22 DIAGNOSIS — M546 Pain in thoracic spine: Secondary | ICD-10-CM | POA: Diagnosis present

## 2015-01-22 MED ORDER — FENTANYL CITRATE (PF) 100 MCG/2ML IJ SOLN: 100.0000 ug | Freq: Once | INTRAMUSCULAR | Status: DC

## 2015-01-22 MED ORDER — FENTANYL CITRATE (PF) 100 MCG/2ML IJ SOLN
INTRAMUSCULAR | Status: AC
  Administered 2015-01-22: 50 ug via INTRAVENOUS
  Filled 2015-01-22: qty 2

## 2015-01-22 MED ORDER — OXYCODONE HCL 5 MG PO TABS: 5.0000 mg | ORAL_TABLET | Freq: Four times a day (QID) | ORAL | Status: DC | PRN

## 2015-01-22 MED ORDER — ROPIVACAINE HCL 2 MG/ML IJ SOLN: 9.0000 mL | Freq: Once | INTRAMUSCULAR | Status: DC

## 2015-01-22 MED ORDER — TRIAMCINOLONE ACETONIDE 40 MG/ML IJ SUSP: 40.0000 mg | Freq: Once | INTRAMUSCULAR | Status: DC

## 2015-01-22 MED ORDER — MIDAZOLAM HCL 5 MG/5ML IJ SOLN: 5.0000 mg | Freq: Once | INTRAMUSCULAR | Status: DC

## 2015-01-22 MED ORDER — MIDAZOLAM HCL 5 MG/5ML IJ SOLN
INTRAMUSCULAR | Status: AC
  Administered 2015-01-22: 1 mg via INTRAVENOUS
  Filled 2015-01-22: qty 5

## 2015-01-22 MED ORDER — LIDOCAINE HCL (PF) 1 % IJ SOLN: 10.0000 mL | Freq: Once | INTRAMUSCULAR | Status: DC

## 2015-01-22 MED ORDER — ROPIVACAINE HCL 2 MG/ML IJ SOLN
INTRAMUSCULAR | Status: AC
  Administered 2015-01-22: 12:00:00
  Filled 2015-01-22: qty 10

## 2015-01-22 MED ORDER — OXYCODONE HCL 5 MG PO CAPS: 5.0000 mg | ORAL_CAPSULE | Freq: Four times a day (QID) | ORAL | Status: DC | PRN

## 2015-01-22 MED ORDER — TRIAMCINOLONE ACETONIDE 40 MG/ML IJ SUSP
INTRAMUSCULAR | Status: AC
  Administered 2015-01-22: 12:00:00
  Filled 2015-01-22: qty 1

## 2015-01-22 MED ORDER — LACTATED RINGERS IV SOLN: 1000.0000 mL | INTRAVENOUS | Status: AC

## 2015-01-22 NOTE — Progress Notes (Signed)
[name] MRN: [id number] DOB: Sep 28, 1945 DOS: 01/22/2015  Primary Reason(s) for Visit: Interventional Pain Management Treatment. CC: Back Pain and Sciatica   Pre-Procedure Assessment:  [name] is a 70 y.o. year old, female patient, seen today for interventional treatment. She has Acquired hypothyroidism; Anxiety; Chronic kidney disease (CKD), stage III (moderate); Clinical depression; Diabetes mellitus, type 2 ([place]); Essential (primary) hypertension; Acid reflux; Adult hypothyroidism; HLD (hyperlipidemia); Gastro-esophageal reflux disease without esophagitis; Arthritis, degenerative; Extreme obesity ([place]); Cannot sleep; Rheumatoid arthritis without elevated rheumatoid factor ([place]); Seizure ([place]); Memory loss, short term; Spinal stenosis; Congestive heart failure with left ventricular systolic dysfunction ([place]); Type 2 diabetes mellitus ([place]); Morbid obesity ([place]); Seronegative rheumatoid arthritis ([place]); Chronic pain; Chronic right knee pain; Osteoarthritis of right knee; Opiate use; Long term current use of opiate analgesic; Long term prescription opiate use; Encounter for therapeutic drug level monitoring; Opioid dependence ([place]); Lumbar spinal stenosis; Lumbar spondylosis; Lumbar facet syndrome (Location of Primary Source of Pain) (Bilateral) (L>R); Lumbar spinal stenosis with neurogenic claudication; Chronic pain syndrome; and Chronic low back pain (Location of Primary Source of Pain) (Bilateral) (L>R) on her problem list.. Her primarily concern today is the Back Pain and Sciatica   Today's Pain Score: 3 , clinically she looks like a 4-5/10. Reported level of pain is incompatible with clinical obrservations. This may be secondary to a possible lack of understanding on how the pain scale works. Pain Type: Chronic pain Pain Descriptors / Indicators: Sharp Pain Frequency: Constant  Date of Last Visit: 12/15/14 Service Provided on Last Visit: Evaluation  Verification  of the correct person, correct site (including marking of site), and correct procedure were performed and confirmed by the patient.  Today's Vitals   01/22/15 1143 01/22/15 1150 01/22/15 1200 01/22/15 1210  BP: 196/83 125/78 105/78 118/70  Pulse: 77 74 71 60  Temp:      TempSrc:      Resp: _0 Height:      Weight:      SpO2: 99% 99% 94% 100%  PainSc:  3     Calculated BMI: Body mass index is 45.26 kg/(m^2). Allergies: She is allergic to penicillins.. Primary Diagnosis: Facet syndrome, lumbar [M54.5]  Procedure:  Type: Palliative Medial Branch Facet Block Region: Lumbar Level: L2, L3, L4, L5, & S1 Medial Branch Level(s) Laterality: Left  Indications: Spondylosis, Lumbosacral Region Lumbosacral Spine Pain  Consent: Secured. Under the influence of no sedatives a written informed consent was obtained, after having provided information on the risks and possible complications. To fulfill our ethical and legal obligations, as recommended by the American Medical Association's Code of Ethics, we have provided information to the patient about our clinical impression; the nature and purpose of the treatment or procedure; the risks, benefits, and possible complications of the intervention; alternatives; the risk(s) and benefit(s) of the alternative treatment(s) or procedure(s); and the risk(s) and benefit(s) of doing nothing. The patient was provided information about the risks and possible complications associated with the procedure. In the case of spinal procedures these may include, but are not limited to, failure to achieve desired goals, infection, bleeding, organ or nerve damage, allergic reactions, paralysis, and death. In addition, the patient was informed that Medicine is not an exact science; therefore, there is also the possibility of unforeseen risks and possible complications that may result in a catastrophic outcome. The patient indicated having understood very clearly. We have  given the patient no guarantees and we have made no promises. Enough time  was given to the patient to ask questions, all of which were answered to the patient's satisfaction.  Pre-Procedure Preparation: Safety Precautions: Allergies reviewed. Appropriate site, procedure, and patient were confirmed by following the Joint Commission's Universal Protocol (UP.01.01.01), in the form of a "Time Out". The patient was asked to confirm marked site and procedure, before commencing. The patient was asked about blood thinners, or active infections, both of which were denied. Patient was assessed for positional comfort and all pressure points were checked before starting procedure. Monitoring:  As per clinic protocol. Infection Control Precautions: Sterile technique used. Standard Universal Precautions were taken as recommended by the Department of [facility] for Disease Control and Prevention (CDC). Standard pre-surgical skin prep was conducted. Respiratory hygiene and cough etiquette was practiced. Hand hygiene observed. Safe injection practices and needle disposal techniques followed. SDV (single dose vial) medications used. Medications properly checked for expiration dates and contaminants. Personal protective equipment (PPE) used: Sterile double glove technique. Radiation resistant gloves. Sterile surgical gloves.  Anesthesia, Analgesia, Anxiolysis: Type: Moderate (Conscious) Sedation & Local Anesthesia. Meaningful verbal contact was maintained, with the patient at all times during the procedure. Local Anesthetic(s): Lidocaine 1% IV Access: Secured Sedation: Meaningful verbal contact was maintained at all times during the procedure. Indication(s):Analgesia.  Description of Procedure Process:  Time-out: "Time-out" completed before starting procedure, as per protocol. Position: Prone Target Area: For Lumbar Facet blocks, the target is the groove formed by the junction of the transverse process and  superior articular process. For the L5 dorsal ramus, the target is the notch between superior articular process and sacral ala. For the S1 dorsal ramus, the target is the superior and lateral edge of the posterior S1 Sacral foramen. Approach: Paramedial approach. Area Prepped: Entire Posterior Lumbosacral Region Prepping solution: ChloraPrep (2% chlorhexidine gluconate and 70% isopropyl alcohol) Safety Precautions: Aspiration looking for blood return was conducted prior to all injections. At no point did we inject any substances, as a needle was being advanced. No attempts were made at seeking any paresthesias. Safe injection practices and needle disposal techniques used. Medications properly checked for expiration dates. SDV (single dose vial) medications used. Description of the Procedure: Protocol guidelines were followed. The patient was placed in position over the fluoroscopy table. The target area was identified and the area prepped in the usual manner. Skin desensitized using vapocoolant spray. Skin & deeper tissues infiltrated with local anesthetic. Appropriate amount of time allowed to pass for local anesthetics to take effect. The procedure needle was introduced through the skin, ipsilateral to the reported pain, and advanced to the target area. Employing the "Medial Branch Technique", the needles were advanced to the angle made by the superior and medial portion of the transverse process, and the lateral and inferior portion of the superior articulating process of the targeted vertebral bodies. This area is known as "Burton's Eye" or the "Eye of the [place] Dog". A procedure needle was introduced through the skin, and this time advanced to the angle made by the superior and medial border of the sacral ala, and the lateral border of the S1 vertebral body. This last needle was later repositioned at the superior and lateral border of the posterior S1 foramen. Negative aspiration confirmed. Solution  injected in intermittent fashion, asking for systemic symptoms every 0.5cc of injectate. The needles were then removed and the area cleansed, making sure to leave some of the prepping solution back to take advantage of its long term bactericidal properties. EBL: None Materials & Medications  Used:  Needle(s) Used: 22g - 5" Spinal Needle(s) Medications Administered today: We administered ropivacaine (PF) 2 mg/ml (0.2%), fentaNYL, triamcinolone acetonide, and midazolam.Please see chart orders for dosing details.  Imaging Guidance:  Type of Imaging Technique: Fluoroscopy Guidance (Spinal) Indication(s): Assistance in needle guidance and placement for procedures requiring needle placement in or near specific anatomical locations not easily accessible without such assistance. Exposure Time: Please see nurses notes. Contrast: None required. Fluoroscopic Guidance: I was personally present in the fluoroscopy suite, where the patient was placed in position for the procedure, over the fluoroscopy-compatible table. Fluoroscopy was manipulated, using "Tunnel Vision Technique", to obtain the best possible view of the target area, on the affected side. Parallax error was corrected before commencing the procedure. A "direction-depth-direction" technique was used to introduce the needle under continuous pulsed fluoroscopic guidance. Once the target was reached, antero-posterior, oblique, and lateral fluoroscopic projection views were taken to confirm needle placement in all planes. Permanently recorded images stored by scanning into EMR. Interpretation: Intraoperative imaging interpretation by performing Physician. Adequate needle placement confirmed. Adequate needle placement confirmed in AP, lateral, & Oblique Views. No contrast injected.  Antibiotics:  Type:  Antibiotics Given (last 72 hours)    None      Indication(s): No indications identified.  Post-operative Assessment:  Complications: No immediate  post-treatment complications were observed. Disposition: Return to clinic for follow-up evaluation. The patient tolerated the entire procedure well. A repeat set of vitals were taken after the procedure and the patient was kept under observation following institutional policy, for this procedure. Post-procedural neurological assessment was performed, showing return to baseline, prior to discharge. The patient was discharged home, once institutional criteria were met. The patient was provided with post-procedure discharge instructions, including a section on how to identify potential problems. Should any problems arise concerning this procedure, the patient was given instructions to immediately contact us, at any time, without hesitation. In any case, we plan to contact the patient by telephone for a follow-up status report regarding this interventional procedure. Comments:  No additional relevant information.  Primary Care Physician: Glendon Axe, MD Location: [facility] Note by: Kathlen Brunswick. Dossie Arbour, M.D, DABA, DABAPM, DABPM, DABIPP, FIPP   Illustration of the posterior view of the lumbar spine and the posterior neural structures. Laminae of L2 through S1 are labeled. DPRL5, dorsal primary ramus of L5; DPRS1, dorsal primary ramus of S1; DPR3, dorsal primary ramus of L3; FJ, facet (zygapophyseal) joint L3-L4; I, inferior articular process of L4; LB1, lateral branch of dorsal primary ramus of L1; IAB, inferior articular branches from L3 medial branch (supplies L4-L5 facet joint); IBP, intermediate branch plexus; MB3, medial branch of dorsal primary ramus of L3; NR3, third lumbar nerve root; S, superior articular process of L5; SAB, superior articular branches from L4 (supplies L4-5 facet joint also); TP3, transverse process of L3.  Disclaimer:  Medicine is not an Chief Strategy Officer. The only guarantee in medicine is that nothing is guaranteed. It is important to note that the  decision to proceed with this intervention was based on the information collected from the patient. The Data and conclusions were drawn from the patient's questionnaire, the interview, and the physical examination. Because the information was provided in large part by the patient, it cannot be guaranteed that it has not been purposely or unconsciously manipulated. Every effort has been made to obtain as much relevant data as possible for this evaluation. It is important to note that the conclusions that lead to this procedure are  derived in large part from the available data. Always take into account that the treatment will also be dependent on availability of resources and existing treatment guidelines, considered by other Pain Management Practitioners as being common knowledge and practice, at the time of the intervention. For Medico-Legal purposes, it is also important to point out that variation in procedural techniques and pharmacological choices are the acceptable norm. The indications, contraindications, technique, and results of the above procedure should only be interpreted and judged by a Board-Certified Interventional Pain Specialist with extensive familiarity and expertise in the same exact procedure and technique. Attempts at providing opinions without similar or greater experience and expertise than that of the treating physician will be considered as inappropriate and unethical, and shall result in a formal complaint to the state medical board and applicable specialty societies.

## 2015-01-22 NOTE — Patient Instructions (Signed)
Facet Joint Block The facet joints connect the bones of the spine (vertebrae). They make it possible for you to bend, twist, and make other movements with your spine. They also prevent you from overbending, overtwisting, and making other excessive movements.  A facet joint block is a procedure where a numbing medicine (anesthetic) is injected into a facet joint. Often, a type of anti-inflammatory medicine called a steroid is also injected. A facet joint block may be done for two reasons:   Diagnosis. A facet joint block may be done as a test to see whether neck or back pain is caused by a worn-down or infected facet joint. If the pain gets better after a facet joint block, it means the pain is probably coming from the facet joint. If the pain does not get better, it means the pain is probably not coming from the facet joint.   Therapy. A facet joint block may be done to relieve neck or back pain caused by a facet joint. A facet joint block is only done as a therapy if the pain does not improve with medicine, exercise programs, physical therapy, and other forms of pain management. LET YOUR HEALTH CARE PROVIDER KNOW ABOUT:   Any allergies you have.   All medicines you are taking, including vitamins, herbs, eyedrops, and over-the-counter medicines and creams.   Previous problems you or members of your family have had with the use of anesthetics.   Any blood disorders you have had.   Other health problems you have. RISKS AND COMPLICATIONS Generally, having a facet joint block is safe. However, as with any procedure, complications can occur. Possible complications associated with having a facet joint block include:   Bleeding.   Injury to a nerve near the injection site.   Pain at the injection site.   Weakness or numbness in areas controlled by nerves near the injection site.   Infection.   Temporary fluid retention.   Allergic reaction to anesthetics or medicines used during  the procedure. BEFORE THE PROCEDURE   Follow your health care provider's instructions if you are taking dietary supplements or medicines. You may need to stop taking them or reduce your dosage.   Do not take any new dietary supplements or medicines without asking your health care provider first.   Follow your health care provider's instructions about eating and drinking before the procedure. You may need to stop eating and drinking several hours before the procedure.   Arrange to have an adult drive you home after the procedure. PROCEDURE  You may need to remove your clothing and dress in an open-back gown so that your health care provider can access your spine.   The procedure will be done while you are lying on an X-ray table. Most of the time you will be asked to lie on your stomach, but you may be asked to lie in a different position if an injection will be made in your neck.   Special machines will be used to monitor your oxygen levels, heart rate, and blood pressure.   If an injection will be made in your neck, an intravenous (IV) tube will be inserted into one of your veins. Fluids and medicine will flow directly into your body through the IV tube.   The area over the facet joint where the injection will be made will be cleaned with an antiseptic soap. The surrounding skin will be covered with sterile drapes.   An anesthetic will be applied to your skin   to make the injection area numb. You may feel a temporary stinging or burning sensation.   A video X-ray machine will be used to locate the joint. A contrast dye may be injected into the facet joint area to help with locating the joint.   When the joint is located, an anesthetic medicine will be injected into the joint through the needle.   Your health care provider will ask you whether you feel pain relief. If you do feel relief, a steroid may be injected to provide pain relief for a longer period of time. If you do not  feel relief or feel only partial relief, additional injections of an anesthetic may be made in other facet joints.   The needle will be removed, the skin will be cleansed, and bandages will be applied.  AFTER THE PROCEDURE   You will be observed for 15-30 minutes before being allowed to go home. Do not drive. Have an adult drive you or take a taxi or public transportation instead.   If you feel pain relief, the pain will return in several hours or days when the anesthetic wears off.   You may feel pain relief 2-14 days after the procedure. The amount of time this relief lasts varies from person to person.   It is normal to feel some tenderness over the injected area(s) for 2 days following the procedure.   If you have diabetes, you may have a temporary increase in blood sugar.   This information is not intended to replace advice given to you by your health care provider. Make sure you discuss any questions you have with your health care provider.   Document Released: 05/18/2006 Document Revised: 01/17/2014 Document Reviewed: 10/17/2011 Elsevier Interactive Patient Education 2016 [employer] Pain Management Discharge Instructions  General Discharge Instructions :  If you need to reach your doctor call: Monday-Friday 8:00 am - 4:00 pm at [phone] or toll free [phone].  After clinic hours [phone] to have operator reach doctor.  Bring all of your medication bottles to all your appointments in the pain clinic.  To cancel or reschedule your appointment with Pain Management please remember to call 24 hours in advance to avoid a fee.  Refer to the educational materials which you have been given on: General Risks, I had my Procedure. Discharge Instructions, Post Sedation.  Post Procedure Instructions:  The drugs you were given will stay in your system until tomorrow, so for the next 24 hours you should not drive, make any legal decisions or drink any alcoholic  beverages.  You may eat anything you prefer, but it is better to start with liquids then soups and crackers, and gradually work up to solid foods.  Please notify your doctor immediately if you have any unusual bleeding, trouble breathing or pain that is not related to your normal pain.  Depending on the type of procedure that was done, some parts of your body may feel week and/or numb.  This usually clears up by tonight or the next day.  Walk with the use of an assistive device or accompanied by an adult for the 24 hours.  You may use ice on the affected area for the first 24 hours.  Put ice in a Ziploc bag and cover with a towel and place against area 15 minutes on 15 minutes off.  You may switch to heat after 24 hours. 

## 2015-01-22 NOTE — Progress Notes (Signed)
Safety precautions to be maintained throughout the outpatient stay will include: orient to surroundings, keep bed in low position, maintain call bell within reach at all times, provide assistance with transfer out of bed and ambulation.  

## 2015-01-23 ENCOUNTER — Telehealth: Payer: Self-pay

## 2015-01-23 NOTE — Telephone Encounter (Signed)
Procedure call back...left message 

## 2015-01-29 ENCOUNTER — Ambulatory Visit (INDEPENDENT_AMBULATORY_CARE_PROVIDER_SITE_OTHER): Payer: Medicare Other | Admitting: Psychiatry

## 2015-01-29 ENCOUNTER — Encounter: Payer: Self-pay | Admitting: Psychiatry

## 2015-01-29 VITALS — BP 162/100 | HR 124 | Temp 97.2°F | Ht 65.0 in | Wt 276.2 lb

## 2015-01-29 DIAGNOSIS — F329 Major depressive disorder, single episode, unspecified: Secondary | ICD-10-CM | POA: Diagnosis not present

## 2015-01-29 DIAGNOSIS — F32A Depression, unspecified: Secondary | ICD-10-CM

## 2015-01-29 DIAGNOSIS — G4700 Insomnia, unspecified: Secondary | ICD-10-CM

## 2015-01-29 MED ORDER — QUETIAPINE FUMARATE 300 MG PO TABS: 300.0000 mg | ORAL_TABLET | Freq: Every day | ORAL | Status: DC

## 2015-01-29 MED ORDER — DULOXETINE HCL 30 MG PO CPEP: ORAL_CAPSULE | ORAL | Status: DC

## 2015-01-29 NOTE — Progress Notes (Signed)
BH MD/PA/NP OP Progress Note  01/29/2015 2:48 PM Laura [name]  MRN:  [id number]  Subjective:  Patient indicates that overall her depression is under good control with her Cymbalta. Patient continues to feel the Seroquel is been helpful for sleep and mood. Today she spends most of the appointment stating that the main stressor for her is her son's vocational status. She indicates that he left his job in [employer] and now is trying to make ends meet through a couple of other jobs that do not pay as well. She states that she somewhat frustrated because she feels like her daughter-in-law could be more active and get a job to contribute. She states she fears that they might ultimately lose their home. She states that she struggles because she does not want to intervene and cause problems with the daughter-in-law but yet she feels like her son is struggling.  We discussed the metabolic issues related to Seroquel as she stated that she did go to her primary care office to have the labs done but that the technician/front desk stated that primary care will need to approve the labs for them to draw them there. She states she made efforts to try to get a message to the doctor that she was unsuccessful. I've given her another laboratory slip and she states that she is going to take that to an appointment that she has with the doctor. Chief Complaint    Follow-up; Medication Refill     Visit Diagnosis:     ICD-9-CM ICD-10-CM   1. Insomnia 780.52 G47.00   2. Depression 311 F32.9     Past Medical History:  Past Medical History  Diagnosis Date  . Anxiety   . Depression   . Chronic kidney disease   . Thyroid disease   . Allergy   . Sleep apnea     Past Surgical History  Procedure Laterality Date  . Abdominal hysterectomy    . Cesarean section    . Replacement total knee Left   . Hip surgery Right    Family History:  Family History  Problem Relation Age of Onset  . Hypertension Mother    . Stroke Mother   . Heart attack Father   . Alcohol abuse Father   . Depression Father   . Post-traumatic stress disorder Father   . Rheum arthritis Sister   . Hypertension Sister   . Depression Sister    Social History:  Social History   Social History  . Marital Status: Married    Spouse Name: N/A  . Number of Children: N/A  . Years of Education: N/A   Social History Main Topics  . Smoking status: Never Smoker   . Smokeless tobacco: Never Used  . Alcohol Use: No  . Drug Use: No  . Sexual Activity: Not Currently   Other Topics Concern  . None   Social History Narrative   Additional History:   Assessment:   Musculoskeletal: Strength & Muscle Tone: within normal limits Gait & Station: normal Patient leans: N/A  Psychiatric Specialty Exam: HPI  Review of Systems  Psychiatric/Behavioral: Negative for depression, suicidal ideas, hallucinations, memory loss and substance abuse. The patient is not nervous/anxious and does not have insomnia (Avis complaints have improved and she attributes it to  Seroquel).   All other systems reviewed and are negative.   Blood pressure 162/100, pulse 124, temperature 97.2 F (36.2 C), temperature source Tympanic, height 5\' 5"  (1.651 m), weight 276 lb 3.2 oz (125.283  kg), SpO2 92 %.Body mass index is 45.96 kg/(m^2).  General Appearance: Well Groomed  Eye Contact:  Good  Speech:  Normal Rate  Volume:  Normal  Mood:  Good  Affect:  Congruent  Thought Process:  Linear and Logical  Orientation:  Full (Time, Place, and Person)  Thought Content:  Negative  Suicidal Thoughts:  No  Homicidal Thoughts:  No  Memory:  Immediate;   Good Recent;   Good Remote;   Good  Judgement:  Good  Insight:  Good  Psychomotor Activity:  Negative  Concentration:  Good  Recall:  Good  Fund of Knowledge: Good  Language: Good  Akathisia:  Negative  Handed:    AIMS (if indicated):  N/A  Assets:  Communication Skills Desire for Improvement Social  Support  ADL's:  Intact  Cognition: WNL  Sleep:  fair   Is the patient at risk to self?  No. Has the patient been a risk to self in the past 6 months?  No. Has the patient been a risk to self within the distant past?  No. Is the patient a risk to others?  No. Has the patient been a risk to others in the past 6 months?  No. Has the patient been a risk to others within the distant past?  No.  Current Medications: Current Outpatient Prescriptions  Medication Sig Dispense Refill  . amLODipine (NORVASC) 10 MG tablet Take 5 mg by mouth 2 (two) times daily.   11  . aspirin EC 81 MG tablet Take by mouth.    . Cholecalciferol (VITAMIN D) 2000 UNITS tablet Take by mouth daily.     . DULoxetine (CYMBALTA) 30 MG capsule Two tablets in the morning, and one in the evening. 270 capsule 0  . esomeprazole (NEXIUM) 20 MG capsule Take by mouth daily.     . furosemide (LASIX) 40 MG tablet TAKE 1 TABLET ONCE DAILY    . hydroxychloroquine (PLAQUENIL) 200 MG tablet TAKE 1 TABLET BY MOUTH TWICE A  DAY AS DIRECTED    . levothyroxine (SYNTHROID, LEVOTHROID) 200 MCG tablet TAKE 1 TABLET ONCE DAILY- ON AN EMPTY STOMACH WITH A GLASS OF WATER 30-60 MINUTES BEFORE BREAKFAST  5  . levothyroxine (SYNTHROID, LEVOTHROID) 25 MCG tablet TAKE 1 TABLET BY MOUTH EVERY DAY (TAKE WITH 20O MCG TABLET TO EQUAL A 225 MCG DOSE)  5  . loratadine (CLARITIN) 10 MG tablet Take by mouth daily as needed.     . metoprolol succinate (TOPROL-XL) 50 MG 24 hr tablet TAKE 1 TABLET (50 MG TOTAL) BY MOUTH ONCE DAILY.  1  . Multiple Vitamin (MULTI-VITAMINS) TABS Take by mouth.    . oxyCODONE (OXY IR/ROXICODONE) 5 MG immediate release tablet Take 1 tablet (5 mg total) by mouth every 6 (six) hours as needed for moderate pain or severe pain. 120 tablet 0  . QUEtiapine (SEROQUEL) 300 MG tablet Take 1 tablet (300 mg total) by mouth at bedtime. 90 tablet 1  . spironolactone (ALDACTONE) 25 MG tablet TAKE 1/2 (ONE-HALF) TABLET BY MOUTH DAILY  11    Current Facility-Administered Medications  Medication Dose Route Frequency Provider Last Rate Last Dose  . fentaNYL (SUBLIMAZE) injection 100 mcg  100 mcg Intravenous Once Delano Metz, MD      . lidocaine (PF) (XYLOCAINE) 1 % injection 10 mL  10 mL Infiltration Once Delano Metz, MD      . lidocaine (PF) (XYLOCAINE) 1 % injection 10 mL  10 mL Other Once Delano Metz, MD      .  methylPREDNISolone acetate (DEPO-MEDROL) injection 80 mg  80 mg Intra-articular Once Delano Metz, MD      . midazolam (VERSED) 5 MG/5ML injection 5 mg  5 mg Intravenous Once Delano Metz, MD      . ropivacaine (PF) 2 mg/ml (0.2%) (NAROPIN) epidural 4 mL  4 mL Other Once Delano Metz, MD      . ropivacaine (PF) 2 mg/ml (0.2%) (NAROPIN) epidural 9 mL  9 mL Other Once Delano Metz, MD      . triamcinolone acetonide (KENALOG-40) injection 40 mg  40 mg Other Once Delano Metz, MD        Medical Decision Making:  Established Problem, Stable/Improving (1), Review of Medication Regimen & Side Effects (2) and Review of New Medication or Change in Dosage (2)  Treatment Plan Summary:Medication management and Plan    major depressive disorder, recurrent, moderate-patient will continue her Cymbalta 60 mg in the morning and 30 mg in the evening.   Insomnia-Continue Seroquel 300 mg at bedtime for sleep and mood as it has been effective for her.I have been given another slip to have metabolic labs drawn.  Patient will follow up in 3 months. His artery where my departure and is aware she will follow up with another provider within this clinic. Wallace Going 01/29/2015, 2:48 PM

## 2015-02-02 ENCOUNTER — Encounter: Payer: PRIVATE HEALTH INSURANCE | Admitting: Pain Medicine

## 2015-02-09 ENCOUNTER — Ambulatory Visit
Admission: RE | Admit: 2015-02-09 | Discharge: 2015-02-09 | Disposition: A | Discharge status: Home / Self Care | Payer: Medicare Other | Source: Ambulatory Visit | Attending: Pain Medicine | Admitting: Pain Medicine

## 2015-02-09 ENCOUNTER — Ambulatory Visit (HOSPITAL_BASED_OUTPATIENT_CLINIC_OR_DEPARTMENT_OTHER): Payer: Medicare Other | Admitting: Pain Medicine

## 2015-02-09 ENCOUNTER — Encounter: Payer: Self-pay | Admitting: Pain Medicine

## 2015-02-09 VITALS — BP 140/77 | HR 87 | Temp 98.1°F | Resp 18 | Ht 65.0 in | Wt 270.0 lb

## 2015-02-09 DIAGNOSIS — Z96652 Presence of left artificial knee joint: Secondary | ICD-10-CM | POA: Insufficient documentation

## 2015-02-09 DIAGNOSIS — G8929 Other chronic pain: Secondary | ICD-10-CM | POA: Diagnosis not present

## 2015-02-09 DIAGNOSIS — M25552 Pain in left hip: Secondary | ICD-10-CM | POA: Diagnosis not present

## 2015-02-09 DIAGNOSIS — Z8781 Personal history of (healed) traumatic fracture: Secondary | ICD-10-CM

## 2015-02-09 DIAGNOSIS — E039 Hypothyroidism, unspecified: Secondary | ICD-10-CM | POA: Diagnosis not present

## 2015-02-09 DIAGNOSIS — F329 Major depressive disorder, single episode, unspecified: Secondary | ICD-10-CM | POA: Insufficient documentation

## 2015-02-09 DIAGNOSIS — M4806 Spinal stenosis, lumbar region: Secondary | ICD-10-CM | POA: Diagnosis not present

## 2015-02-09 DIAGNOSIS — R569 Unspecified convulsions: Secondary | ICD-10-CM | POA: Insufficient documentation

## 2015-02-09 DIAGNOSIS — M160 Bilateral primary osteoarthritis of hip: Secondary | ICD-10-CM | POA: Insufficient documentation

## 2015-02-09 DIAGNOSIS — F419 Anxiety disorder, unspecified: Secondary | ICD-10-CM | POA: Diagnosis not present

## 2015-02-09 DIAGNOSIS — M170 Bilateral primary osteoarthritis of knee: Secondary | ICD-10-CM | POA: Insufficient documentation

## 2015-02-09 DIAGNOSIS — M069 Rheumatoid arthritis, unspecified: Secondary | ICD-10-CM | POA: Insufficient documentation

## 2015-02-09 DIAGNOSIS — E119 Type 2 diabetes mellitus without complications: Secondary | ICD-10-CM | POA: Insufficient documentation

## 2015-02-09 DIAGNOSIS — K219 Gastro-esophageal reflux disease without esophagitis: Secondary | ICD-10-CM | POA: Insufficient documentation

## 2015-02-09 DIAGNOSIS — E785 Hyperlipidemia, unspecified: Secondary | ICD-10-CM | POA: Diagnosis not present

## 2015-02-09 DIAGNOSIS — Z9889 Other specified postprocedural states: Secondary | ICD-10-CM | POA: Insufficient documentation

## 2015-02-09 DIAGNOSIS — M47816 Spondylosis without myelopathy or radiculopathy, lumbar region: Secondary | ICD-10-CM | POA: Diagnosis not present

## 2015-02-09 DIAGNOSIS — I129 Hypertensive chronic kidney disease with stage 1 through stage 4 chronic kidney disease, or unspecified chronic kidney disease: Secondary | ICD-10-CM | POA: Diagnosis not present

## 2015-02-09 DIAGNOSIS — M48061 Spinal stenosis, lumbar region without neurogenic claudication: Secondary | ICD-10-CM | POA: Insufficient documentation

## 2015-02-09 HISTORY — DX: Personal history of (healed) traumatic fracture: Z87.81

## 2015-02-09 HISTORY — DX: Presence of left artificial knee joint: Z96.652

## 2015-02-09 IMAGING — CR DG HIP (WITH OR WITHOUT PELVIS) 2-3V*L*
3 series · 3 of 3 positions shown · non-contrast
Comparison: None.

CLINICAL DATA: Acute exacerbation of left hip pain.

EXAM:
DG HIP (WITH OR WITHOUT PELVIS) 2-3V LEFT

[pelvis ap]
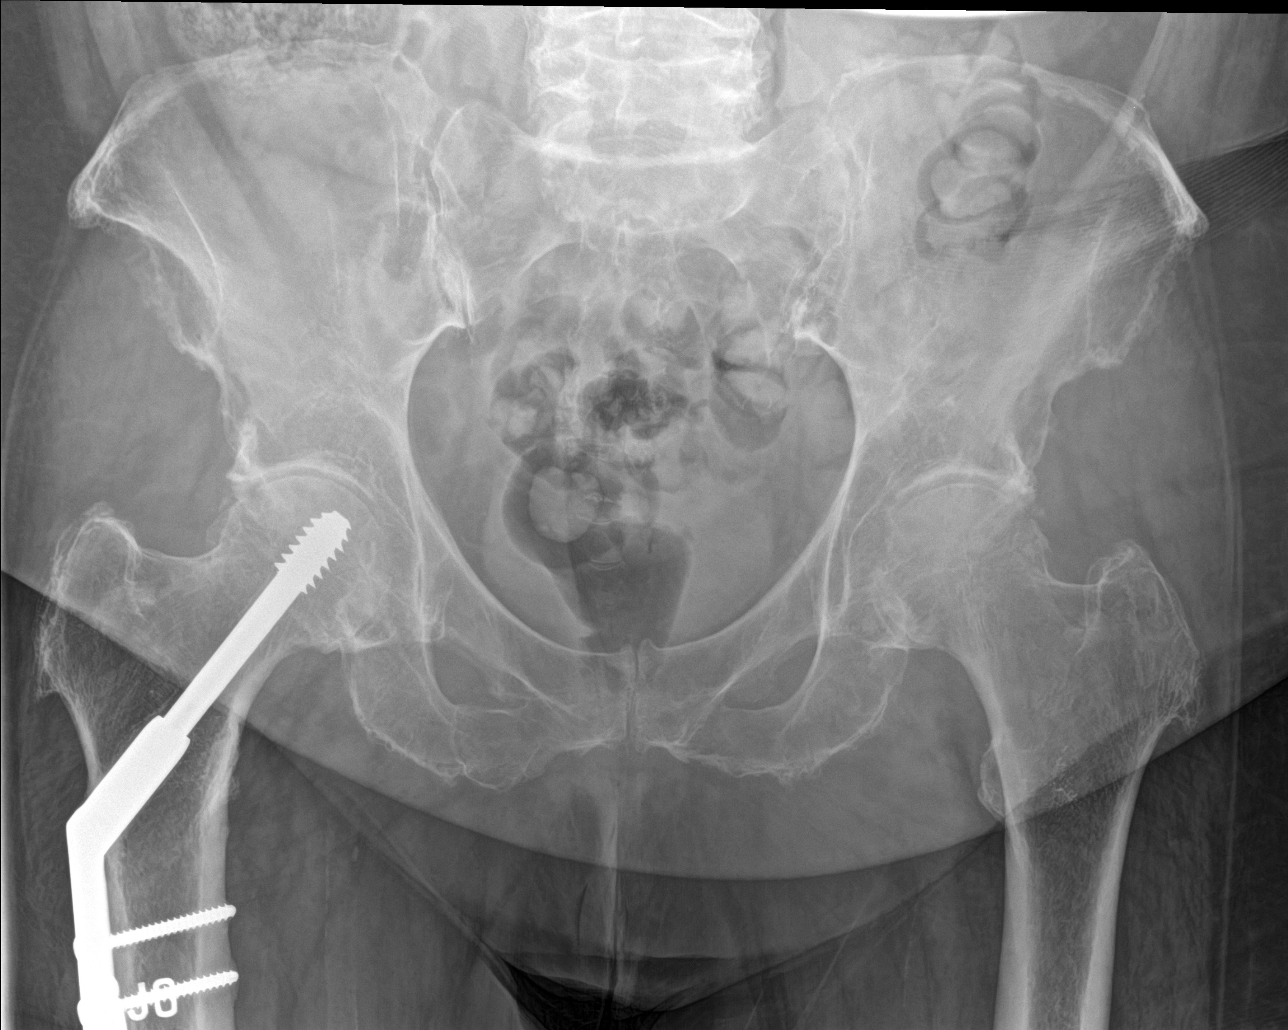

[hip ap]
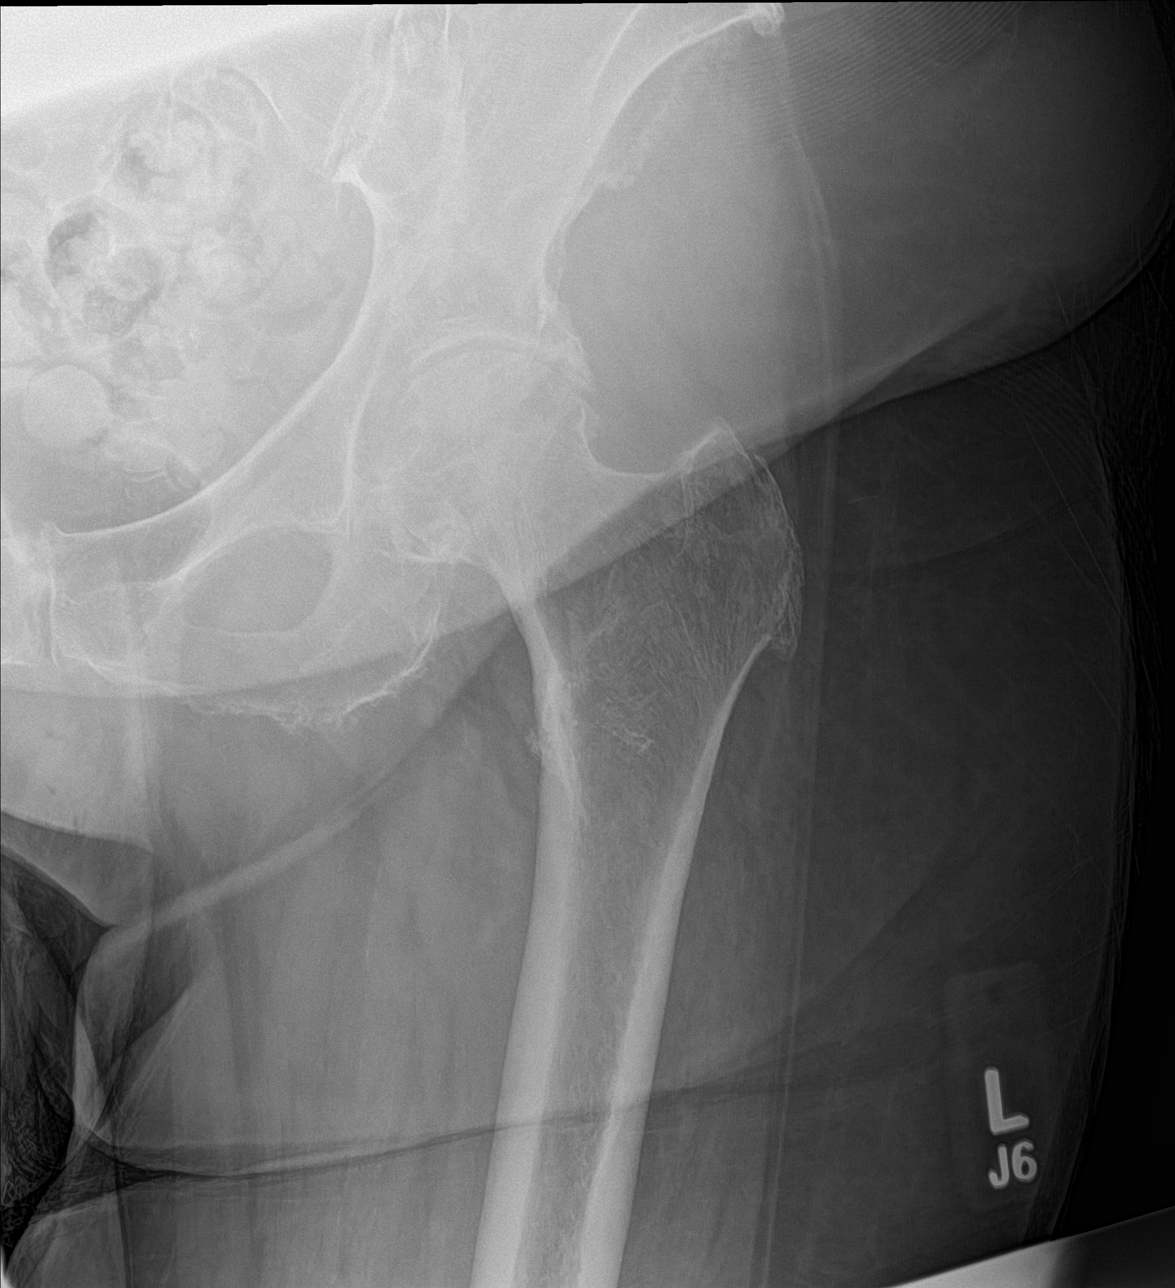

[hip lat]
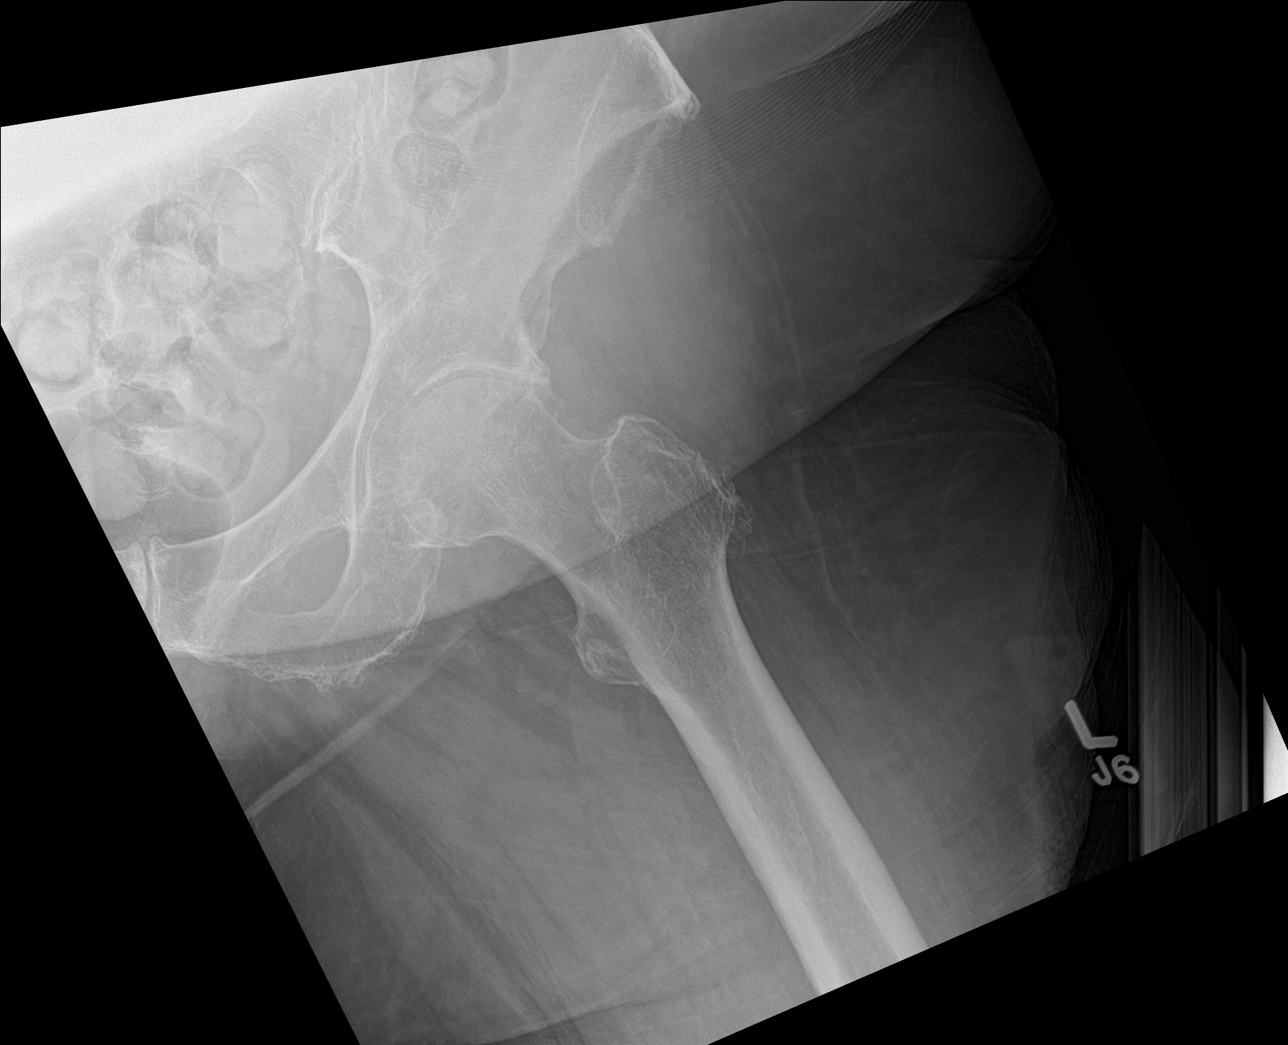

[3 of 3 positions shown; findings below may reference images not displayed]

FINDINGS: Degenerative changes seen in the left hip with osteophytes and mild
loss of joint space. No bony lesions or bony erosion. No fractures
or dislocations identified.
IMPRESSION: Degenerative changes.

## 2015-02-09 IMAGING — CR DG HIP (WITH OR WITHOUT PELVIS) 2-3V*R*
3 series · 3 of 3 positions shown · non-contrast
Comparison: None in PACs

CLINICAL DATA: Chronic right hip pain, acute left hip pain

EXAM:
DG HIP (WITH OR WITHOUT PELVIS) 2-3V RIGHT

[hip ap]
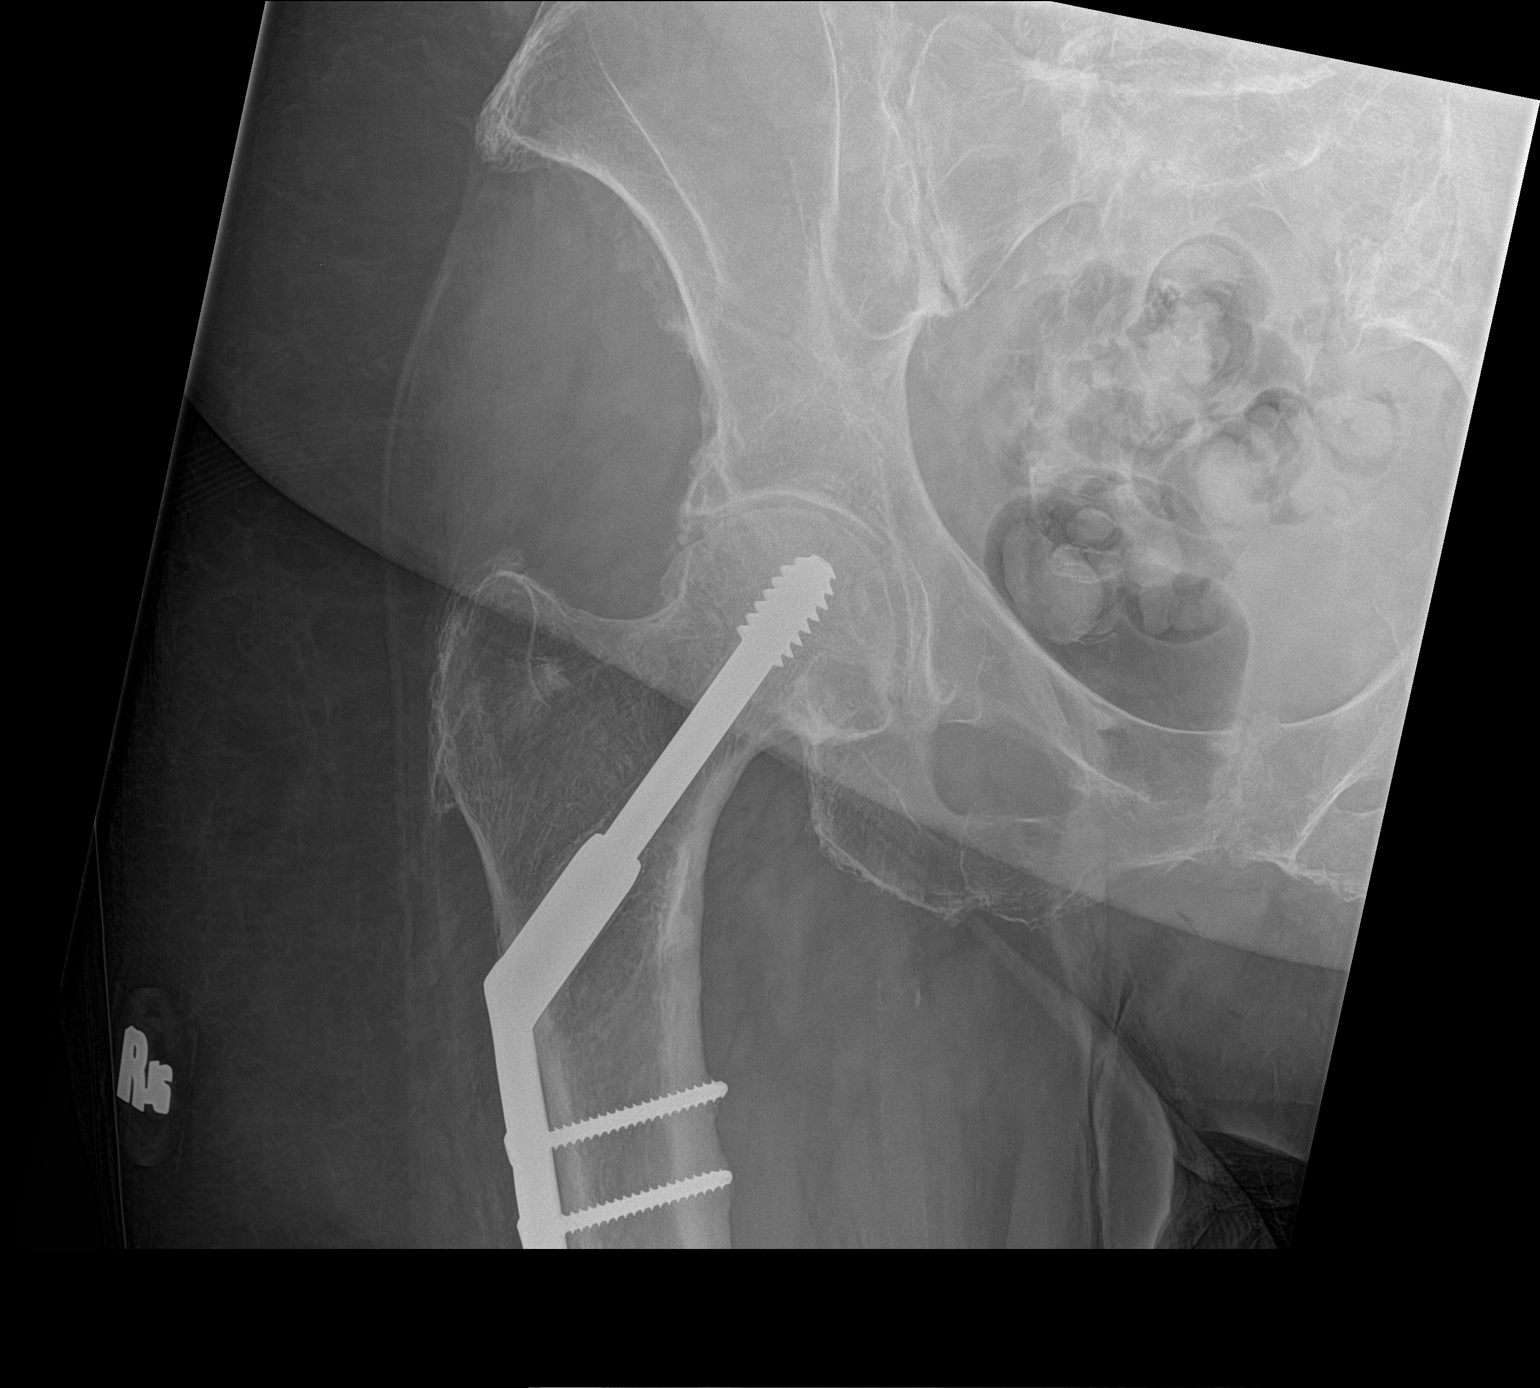

[pelvis ap]
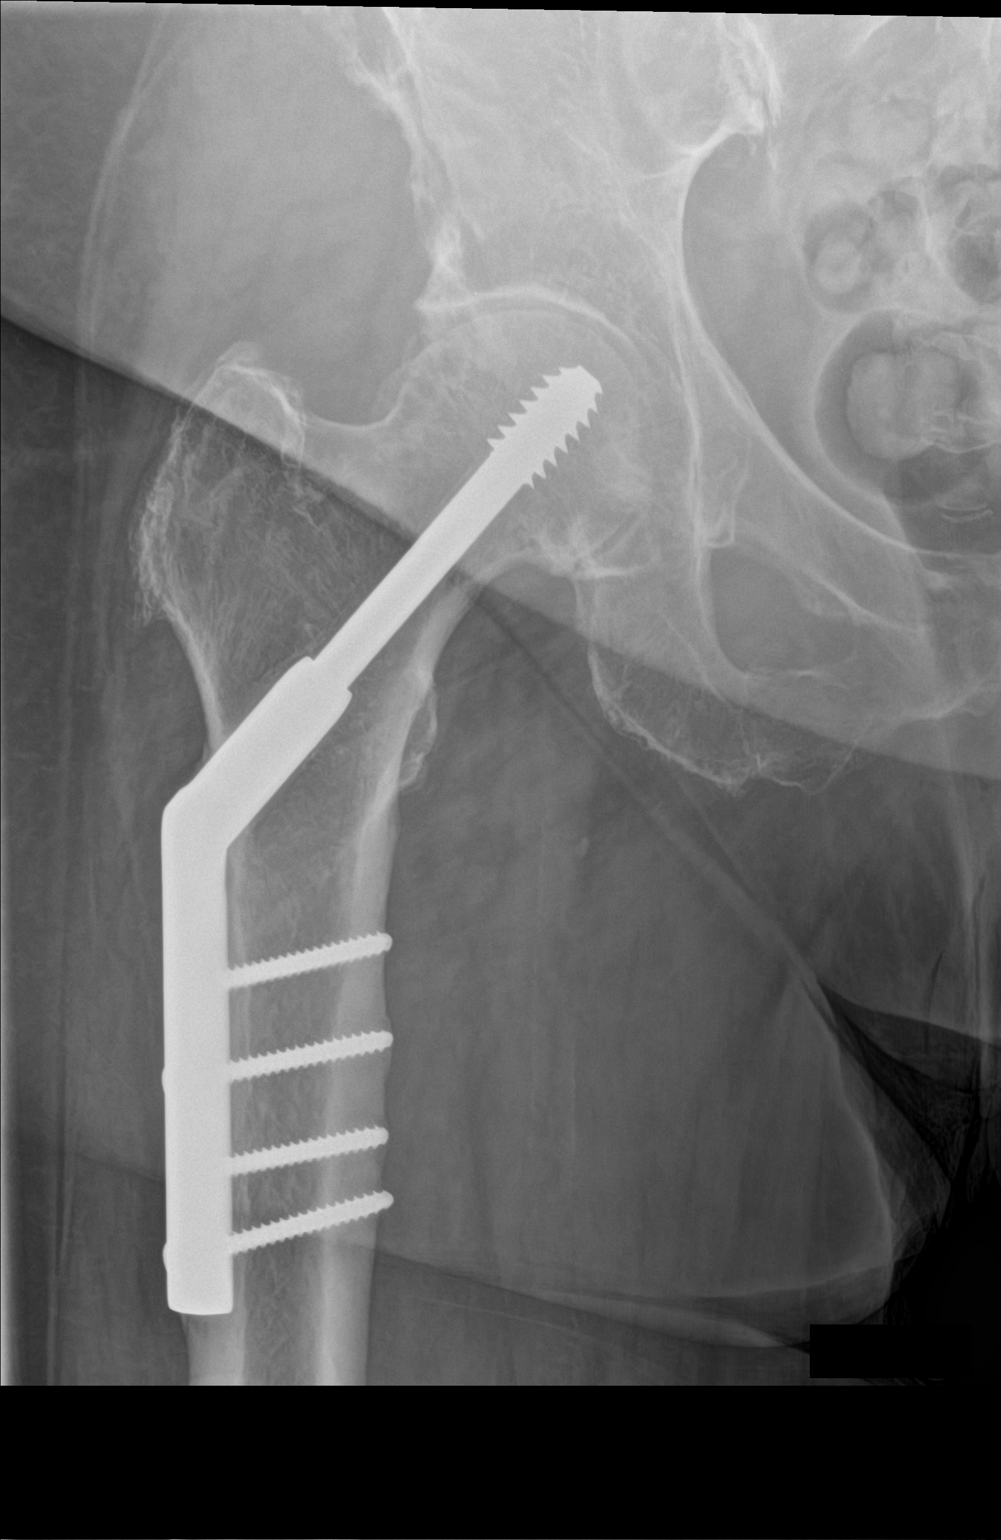

[hip lat]
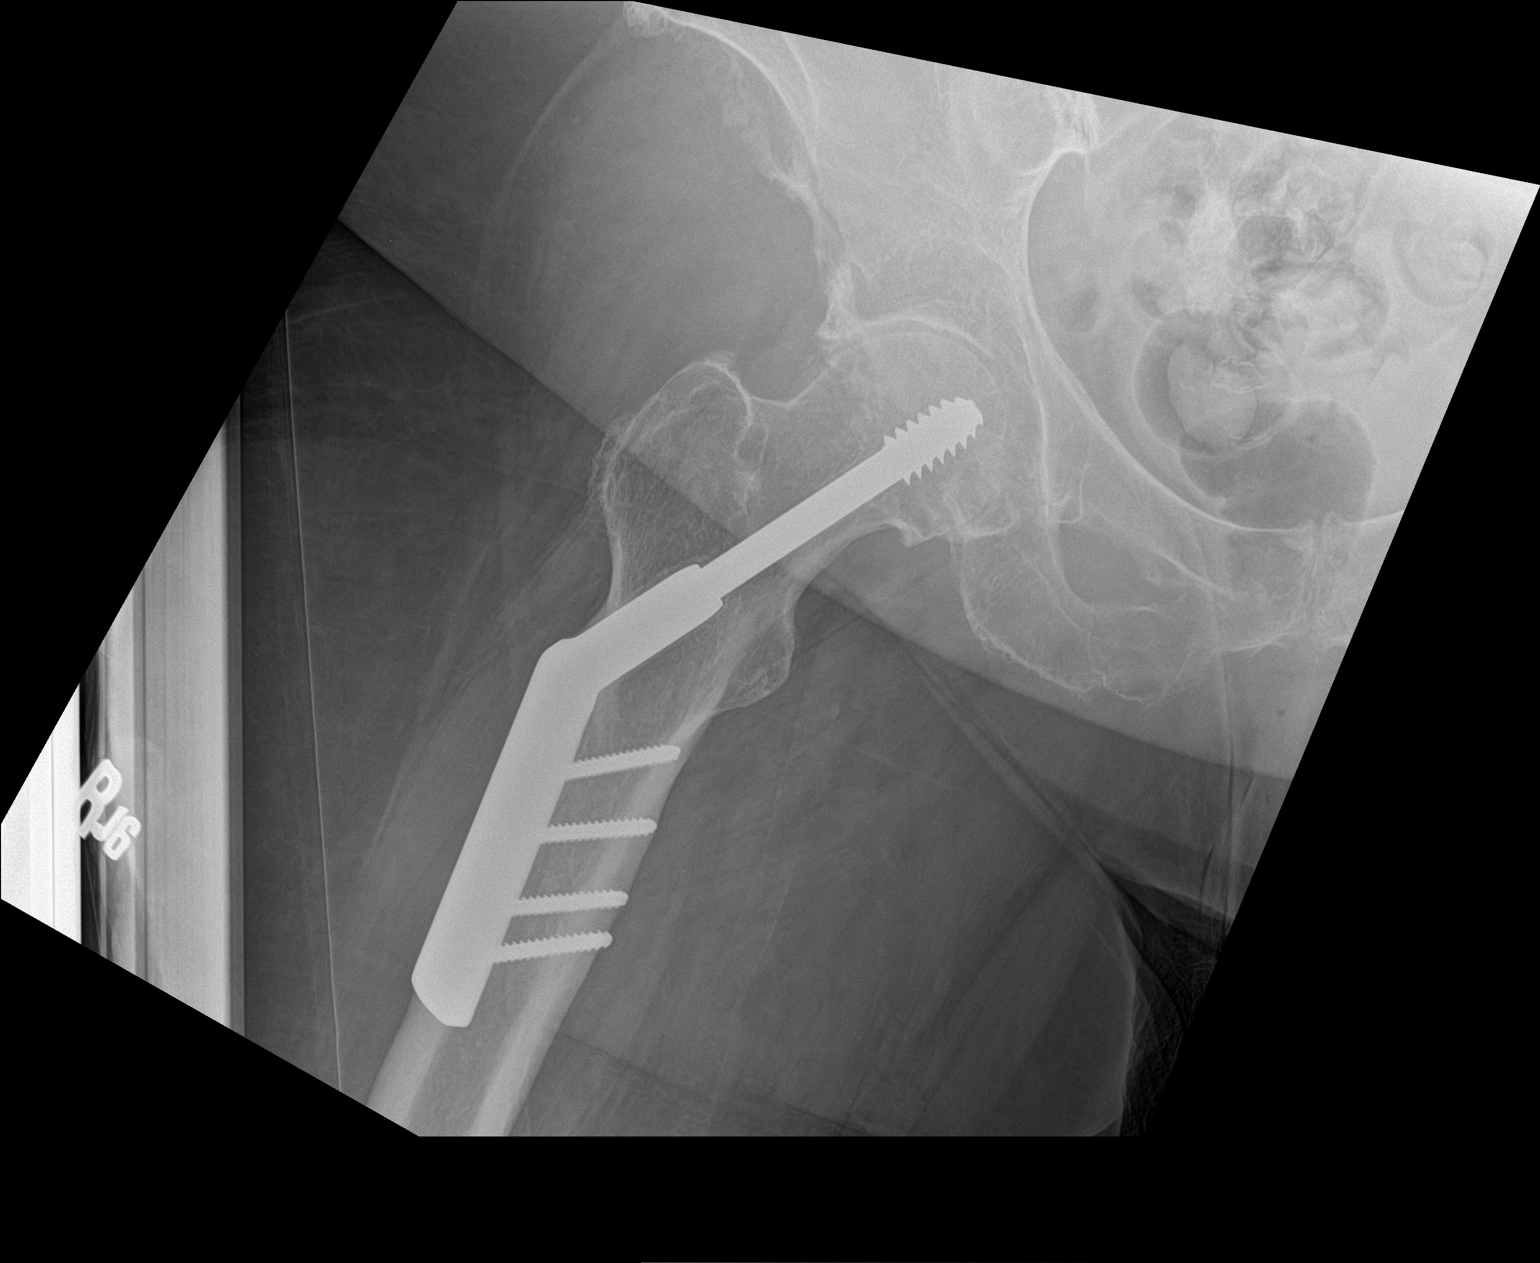

[3 of 3 positions shown; findings below may reference images not displayed]

FINDINGS: The patient has undergone ORIF for previous proximal femoral
fracture. No fracture line is visible today. The telescoping screw
and side plate and cortical screws appear intact. There is moderate
symmetric narrowing of the right hip joint space. There is bony
deformity of the inferior pubic ramus on the right which may be
chronic. The superior pubic ramus appears intact. There is
calcification in the wall of the common femoral artery.
IMPRESSION: There is moderate symmetric narrowing of the right hip joint space
consistent with osteoarthritis. There is no acute bony abnormality
of the right hip. There is bony deformity of the inferior pubic
ramus on the right which is likely old. There are no previous
studies with which to compare.

## 2015-02-09 NOTE — Patient Instructions (Signed)
Celiac Plexus Block Patient Information  Description: The celiac plexus is a group of nerves which are part of the sympathetic nervous system.  These nerves supply organs in the abdomen and pelvis.  Specific organs supplied with sensation by the celiac plexus include the stomach, liver, gallbladder, pancreas, kidneys and part of the gut.   The celiac plexus is located on both sides of the aorta at approximately the level of the first lumbar vertebral body.  The block will be performed with you lying on your abdomen with a pillow underneath.  Using direct x-ray guidance, the celiac plexus will be located on both sides of the spine.  Numbing medicine will be used to deaden the skin prior to needle insertion.  In most cases, a small amount of sedation can be given by IV prior to the numbing medicine.  Two small needles will be place near the celiac plexus and local anesthetic and steroid will be injected.  The entire block usually last about 15-25 minutes.  Conditions which may be treated by celiac plexus block:   Acute and chronic pancreatitis  Pain from liver or pancreatic cancer  Pain from Crohn's disease  Other types of abdominal or flank pain  Preparation for the injection:  1. Do not eat any solid food or dairy products within 6 hours of your appointment. 2. You may drink clear liquids up to 2 hours before appointment.  Clear liquids include water, black coffee, juice or soda.  No milk or cream please. 3. You may take your regular medication, including pain medications, with a sip of water before your appointment.  Diabetics should hold regular insulin (if taken separately) and take 1/2 normal NPH dose in the morning of the procedure.  Carry some sugar containing items with you to your appointment. 4. A driver must accompany you and be prepared to drive you home after your procedure. 5. Bring all your current medications with you. 6. An IV may be inserted and sedation may be given at the  discretion of the physician. 7. A blood pressure cuff, EKG, and other monitors will often be applied during the procedure.  Some patients may need to have extra oxygen administered for a short period. 8. You will be asked to provide medical information, including your allergies and medications, prior to the procedure.  We must know immediately if you are taking blood thinners (like Coumadin/Warfarin) or if you are allergic to IV iodine contrast (dye).  We must know if you could possible be pregnant.  Possible side-effects:   Bleeding from needle site or deeper  Infection (rarre, can require surgery)  Nerve injury (rare)  Numbness & tingling (temporary)  Collapsed lung (rare)  Spinal headache ( a headache worse with upright posture)  Light-headedness (temporary)  Pain at injection site (several days)  Decreased blood pressure (temporary)  Weakness in legs (temporary)  Seizure or other drug reaction (rare)  Call if you experience:   Fever/chills associated with headache or increased back/neck pain  Headache worsened by an upright position  New onset weakness or numbness of an extremity below the injection site.  Hives or difficulty breathing (go to the emergency room)  Inflammation or drainage at the injection site.  New onset diarrhea lasting more than 2 weeks.  New symptoms which are concerning to you  Please note:  If effective, we will often do a series of 2-3 injections spaced 3-6 weeks apart to maximally decrease your pain.  If initial series is effective, you may  be a candidate for a more permanent block of the celiac plexus. .  If you have questions, please call [phone] [place]  What are the risk, side effects and possible complications? Generally speaking, most procedures are safe.  However, with any procedure there are risks, side effects, and the possibility of  complications.  The risks and complications are dependent upon the sites that are lesioned, or the type of nerve block to be performed.  The closer the procedure is to the spine, the more serious the risks are.  Great care is taken when placing the radio frequency needles, block needles or lesioning probes, but sometimes complications can occur. 1. Infection: Any time there is an injection through the skin, there is a risk of infection.  This is why sterile conditions are used for these blocks.  There are four possible types of infection. 1. Localized skin infection. 2. Central Nervous System Infection-This can be in the form of Meningitis, which can be deadly. 3. Epidural Infections-This can be in the form of an epidural abscess, which can cause pressure inside of the spine, causing compression of the spinal cord with subsequent paralysis. This would require an emergency surgery to decompress, and there are no guarantees that the patient would recover from the paralysis. 4. Discitis-This is an infection of the intervertebral discs.  It occurs in about 1% of discography procedures.  It is difficult to treat and it may lead to surgery.        2. Pain: the needles have to go through skin and soft tissues, will cause soreness.       3. Damage to internal structures:  The nerves to be lesioned may be near blood vessels or    other nerves which can be potentially damaged.       4. Bleeding: Bleeding is more common if the patient is taking blood thinners such as  aspirin, Coumadin, Ticiid, Plavix, etc., or if he/she have some genetic predisposition  such as hemophilia. Bleeding into the spinal canal can cause compression of the spinal  cord with subsequent paralysis.  This would require an emergency surgery to  decompress and there are no guarantees that the patient would recover from the  paralysis.       5. Pneumothorax:  Puncturing of a lung is a possibility, every time a needle is introduced in  the area of  the chest or upper back.  Pneumothorax refers to free air around the  collapsed lung(s), inside of the thoracic cavity (chest cavity).  Another two possible  complications related to a similar event would include: Hemothorax and Chylothorax.   These are variations of the Pneumothorax, where instead of air around the collapsed  lung(s), you may have blood or chyle, respectively.       6. Spinal headaches: They may occur with any procedures in the area of the spine.       7. Persistent CSF (Cerebro-Spinal Fluid) leakage: This is a rare problem, but may occur  with prolonged intrathecal or epidural catheters either due to the formation of a fistulous  track or a dural tear.       8. Nerve damage: By working so close to the spinal cord, there is always a possibility of  nerve damage, which could be as serious as a permanent spinal cord injury with  paralysis.       9. Death:  Although rare, severe deadly  allergic reactions known as "Anaphylactic  reaction" can occur to any of the medications used.      10. Worsening of the symptoms:  We can always make thing worse.  What are the chances of something like this happening? Chances of any of this occuring are extremely low.  By statistics, you have more of a chance of getting killed in a motor vehicle accident: while driving to the hospital than any of the above occurring .  Nevertheless, you should be aware that they are possibilities.  In general, it is similar to taking a shower.  Everybody knows that you can slip, hit your head and get killed.  Does that mean that you should not shower again?  Nevertheless always keep in mind that statistics do not mean anything if you happen to be on the wrong side of them.  Even if a procedure has a 1 (one) in a 1,000,000 (million) chance of going wrong, it you happen to be that one..Also, keep in mind that by statistics, you have more of a chance of having something go wrong when taking medications.  Who should not have  this procedure? If you are on a blood thinning medication (e.g. Coumadin, Plavix, see list of "Blood Thinners"), or if you have an active infection going on, you should not have the procedure.  If you are taking any blood thinners, please inform your physician.  How should I prepare for this procedure?  Do not eat or drink anything at least six hours prior to the procedure.  Bring a driver with you .  It cannot be a taxi.  Come accompanied by an adult that can drive you back, and that is strong enough to help you if your legs get weak or numb from the local anesthetic.  Take all of your medicines the morning of the procedure with just enough water to swallow them.  If you have diabetes, make sure that you are scheduled to have your procedure done first thing in the morning, whenever possible.  If you have diabetes, take only half of your insulin dose and notify our nurse that you have done so as soon as you arrive at the clinic.  If you are diabetic, but only take blood sugar pills (oral hypoglycemic), then do not take them on the morning of your procedure.  You may take them after you have had the procedure.  Do not take aspirin or any aspirin-containing medications, at least eleven (11) days prior to the procedure.  They may prolong bleeding.  Wear loose fitting clothing that may be easy to take off and that you would not mind if it got stained with Betadine or blood.  Do not wear any jewelry or perfume  Remove any nail coloring.  It will interfere with some of our monitoring equipment.  NOTE: Remember that this is not meant to be interpreted as a complete list of all possible complications.  Unforeseen problems may occur.  BLOOD THINNERS The following drugs contain aspirin or other products, which can cause increased bleeding during surgery and should not be taken for 2 weeks prior to and 1 week after surgery.  If you should need take something for relief of minor pain, you may take  acetaminophen which is found in Tylenol,m Datril, Anacin-3 and Panadol. It is not blood thinner. The products listed below are.  Do not take any of the products listed below in addition to any listed on your instruction sheet.  A.P.C or A.P.C  with Codeine Codeine Phosphate Capsules #3 Ibuprofen Ridaura  ABC compound Congesprin Imuran rimadil  Advil Cope Indocin Robaxisal  Alka-Seltzer Effervescent Pain Reliever and Antacid Coricidin or Coricidin-D  Indomethacin Rufen  Alka-Seltzer plus Cold Medicine Cosprin Ketoprofen S-A-C Tablets  Anacin Analgesic Tablets or Capsules Coumadin Korlgesic Salflex  Anacin Extra Strength Analgesic tablets or capsules CP-2 Tablets Lanoril Salicylate  Anaprox Cuprimine Capsules Levenox Salocol  Anexsia-D Dalteparin Magan Salsalate  Anodynos Darvon compound Magnesium Salicylate Sine-off  Ansaid Dasin Capsules Magsal Sodium Salicylate  Anturane Depen Capsules Marnal Soma  APF Arthritis pain formula Dewitt's Pills Measurin Stanback  Argesic Dia-Gesic Meclofenamic Sulfinpyrazone  Arthritis Bayer Timed Release Aspirin Diclofenac Meclomen Sulindac  Arthritis pain formula Anacin Dicumarol Medipren Supac  Analgesic (Safety coated) Arthralgen Diffunasal Mefanamic Suprofen  Arthritis Strength Bufferin Dihydrocodeine Mepro Compound Suprol  Arthropan liquid Dopirydamole Methcarbomol with Aspirin Synalgos  ASA tablets/Enseals Disalcid Micrainin Tagament  Ascriptin Doan's Midol Talwin  Ascriptin A/D Dolene Mobidin Tanderil  Ascriptin Extra Strength Dolobid Moblgesic Ticlid  Ascriptin with Codeine Doloprin or Doloprin with Codeine Momentum Tolectin  Asperbuf Duoprin Mono-gesic Trendar  Aspergum Duradyne Motrin or Motrin IB Triminicin  Aspirin plain, buffered or enteric coated Durasal Myochrisine Trigesic  Aspirin Suppositories Easprin Nalfon Trillsate  Aspirin with Codeine Ecotrin Regular or Extra Strength Naprosyn Uracel  Atromid-S Efficin Naproxen Ursinus  Auranofin  Capsules Elmiron Neocylate Vanquish  Axotal Emagrin Norgesic Verin  Azathioprine Empirin or Empirin with Codeine Normiflo Vitamin E  Azolid Emprazil Nuprin Voltaren  Bayer Aspirin plain, buffered or children's or timed BC Tablets or powders Encaprin Orgaran Warfarin Sodium  Buff-a-Comp Enoxaparin Orudis Zorpin  Buff-a-Comp with Codeine Equegesic Os-Cal-Gesic   Buffaprin Excedrin plain, buffered or Extra Strength Oxalid   Bufferin Arthritis Strength Feldene Oxphenbutazone   Bufferin plain or Extra Strength Feldene Capsules Oxycodone with Aspirin   Bufferin with Codeine Fenoprofen Fenoprofen Pabalate or Pabalate-SF   Buffets II Flogesic Panagesic   Buffinol plain or Extra Strength Florinal or Florinal with Codeine Panwarfarin   Buf-Tabs Flurbiprofen Penicillamine   Butalbital Compound Four-way cold tablets Penicillin   Butazolidin Fragmin Pepto-Bismol   Carbenicillin Geminisyn Percodan   Carna Arthritis Reliever Geopen Persantine   Carprofen Gold's salt Persistin   Chloramphenicol Goody's Phenylbutazone   Chloromycetin Haltrain Piroxlcam   Clmetidine heparin Plaquenil   Cllnoril Hyco-pap Ponstel   Clofibrate Hydroxy chloroquine Propoxyphen         Before stopping any of these medications, be sure to consult the physician who ordered them.  Some, such as Coumadin (Warfarin) are ordered to prevent or treat serious conditions such as "deep thrombosis", "pumonary embolisms", and other heart problems.  The amount of time that you may need off of the medication may also vary with the medication and the reason for which you were taking it.  If you are taking any of these medications, please make sure you notify your pain physician before you undergo any procedures.

## 2015-02-09 NOTE — Progress Notes (Signed)
Patient's Name: Tolulope Woolbert MRN: [id number] DOB: Oct 06, 1945 DOS: 02/09/2015  Primary Reason(s) for Visit: Evaluation of one or more chronic illness with mild exacerbation or progression CC: Groin Pain   HPI  Ms. Persons is a 70 y.o. year old, female patient, who returns today as an established patient. She has Acquired hypothyroidism; Anxiety; Chronic kidney disease (CKD), stage III (moderate); Clinical depression; Diabetes mellitus, type 2 (HCC); Essential (primary) hypertension; Acid reflux; Adult hypothyroidism; HLD (hyperlipidemia); Gastro-esophageal reflux disease without esophagitis; Arthritis, degenerative; Extreme obesity (HCC); Cannot sleep; Rheumatoid arthritis without elevated rheumatoid factor (HCC); Seizure (HCC); Memory loss, short term; Congestive heart failure with left ventricular systolic dysfunction (HCC); Type 2 diabetes mellitus (HCC); Morbid obesity (HCC); Seronegative rheumatoid arthritis (HCC); Chronic pain; Chronic knee pain (Right); Osteoarthritis of knee (Right); Opiate use; Long term current use of opiate analgesic; Long term prescription opiate use; Encounter for therapeutic drug level monitoring; Opioid dependence (HCC); Lumbar spinal stenosis (5 mm Severe L3-4; 8 mm L4-5); Lumbar spondylosis; Lumbar facet syndrome (Location of Primary Source of Pain) (Bilateral) (L>R); Lumbar spinal stenosis with neurogenic claudication; Chronic pain syndrome; Chronic low back pain (Location of Primary Source of Pain) (Bilateral) (L>R); Acute hip pain (Left); History of total knee replacement (Left); History of femur fracture (Right); 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); 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 Her primarily concern today is the Groin Pain   The patient returns to the clinic today for postoperative evaluation of a left sided lumbar facet block under fluoroscopic guidance and IV  sedation. The patient indicates having obtained 100% relief of her low back pain. Unfortunately, she started having some new pain in the left hip with severe decreased range of motion where she is unable to lie on her left side or her back. She comes in today for [place] to evaluate this new flareup.  Reported Pain Score: 9 , clinically she looks like a 3-4/10. Reported level is inconsistent with clinical obrservations. Pain Type: Chronic pain Pain Location: Groin Pain Orientation: Left Pain Descriptors / Indicators: Throbbing Pain Frequency: Constant  Date of Last Visit: 01/22/15 Service Provided on Last Visit: Procedure  Pharmacotherapy  Medication(s): The patient is taking oxycodone IR 5 mg every 6 hours when necessary for the pain. We have been prescribing this for the patient. Onset of action: Within expected pharmacological parameters Time to Peak effect: Timing and results are as within normal expected parameters Analgesic Effect: More than 50% Activity Facilitation: Medication(s) allow patient to sit, stand, walk, and do the basic ADLs Perceived Effectiveness: Described as relatively effective, allowing for increase in activities of daily living (ADL) Side-effects or Adverse reactions: None reported Duration of action: Within normal limits for medication [place] PMP: Compliant with practice rules and regulations UDS Results: The patient's last UDS done on 11/05/2014 came back within normal limits with no unexpected substances with regards to the opioids. However, it does show some metabolites from the benzodiazepines. I have not prescribed any for her, but I have taken the time to explain to her the risk based on the CDC opioid guidelines. She understood and accepted and she is taking steps to avoid taking the opioids close to the insula as peas. UDS Interpretation: Patient appears to be compliant with practice rules and regulations Medication Assessment Form: Reviewed. Patient indicates being  compliant with therapy Treatment compliance: Compliant Substance Use Disorder (SUD) Risk Level: Low Pharmacologic Plan: Continue therapy as is  Lab Work: Illicit  Drugs No results found for: THCU, COCAINSCRNUR, PCPSCRNUR, MDMA, AMPHETMU, METHADONE, ETOH  Inflammation Markers Lab Results  Component Value Date   ESRSEDRATE 10 04/04/2012    Renal Function Lab Results  Component Value Date   BUN 20* 06/12/2013   CREATININE 1.42* 06/12/2013   GFRAA 44* 06/12/2013   GFRNONAA 38* 06/12/2013    Hepatic Function No results found for: AST, ALT, ALBUMIN  Electrolytes Lab Results  Component Value Date   NA 138 06/12/2013   K 4.1 06/12/2013   CL 102 06/12/2013   CALCIUM 8.6 06/12/2013   MG 2.0 06/19/2012    Post-Procedure Assessment  Procedure done on last visit: Diagnostic left bar facet block under fluoroscopic guidance and IV sedation. Side-effects or Adverse reactions: None reported Sedation: Procedure was performed with sedation  Results: Ultra-Short Term Relief (First 1 hour after procedure): 100 %  Possibly the results is influenced by the pharmacodynamic effect of the local anesthetic, as well as that of the intravenous analgesics and/or sedatives, when used Short Term Relief (Initial 4-6 hrs after procedure): 100 % (pt fhas very little pain from the injection site) Short-term relief confirms injected site to be the source of pain Long Term Relief : 100 % (pt states no pain down the leg but is having groin pain) Long-term benefit would suggest an inflammatory etiology to the pain   Current Relief (Now):  100% relief of the low back pain. Currently she has none. Persistent relief would suggest effective anti-inflammatory effects from steroids Interpretation of Results: This has demonstrated that her low back pain is coming from the facet joints and that facet joint injections are a good alternative for palliative management of this pain. Should the pain become more  permanent, and failed to respond to the palliative injections, we can consider radiofrequency.  Allergies  Ms. Lecy is allergic to penicillins.  Meds  The patient has a current medication list which includes the following prescription(s): amlodipine, aspirin ec, vitamin d, duloxetine, esomeprazole, furosemide, hydroxychloroquine, levothyroxine, levothyroxine, loratadine, metoprolol succinate, multi-vitamins, oxycodone, quetiapine, and spironolactone.  Current Outpatient Prescriptions on File Prior to Visit  Medication Sig  . amLODipine (NORVASC) 10 MG tablet Take 5 mg by mouth 2 (two) times daily.   Marland Kitchen aspirin EC 81 MG tablet Take by mouth.  . Cholecalciferol (VITAMIN D) 2000 UNITS tablet Take by mouth daily.   . DULoxetine (CYMBALTA) 30 MG capsule Two tablets in the morning, and one in the evening.  Marland Kitchen esomeprazole (NEXIUM) 20 MG capsule Take by mouth daily.   . furosemide (LASIX) 40 MG tablet TAKE 1 TABLET ONCE DAILY  . hydroxychloroquine (PLAQUENIL) 200 MG tablet TAKE 1 TABLET BY MOUTH TWICE A  DAY AS DIRECTED  . levothyroxine (SYNTHROID, LEVOTHROID) 200 MCG tablet TAKE 1 TABLET ONCE DAILY- ON AN EMPTY STOMACH WITH A GLASS OF WATER 30-60 MINUTES BEFORE BREAKFAST  . levothyroxine (SYNTHROID, LEVOTHROID) 25 MCG tablet TAKE 1 TABLET BY MOUTH EVERY DAY (TAKE WITH 20O MCG TABLET TO EQUAL A 225 MCG DOSE)  . loratadine (CLARITIN) 10 MG tablet Take by mouth daily as needed.   . metoprolol succinate (TOPROL-XL) 50 MG 24 hr tablet TAKE 1 TABLET (50 MG TOTAL) BY MOUTH ONCE DAILY.  . Multiple Vitamin (MULTI-VITAMINS) TABS Take by mouth.  . oxyCODONE (OXY IR/ROXICODONE) 5 MG immediate release tablet Take 1 tablet (5 mg total) by mouth every 6 (six) hours as needed for moderate pain or severe pain.  Marland Kitchen QUEtiapine (SEROQUEL) 300 MG tablet Take 1 tablet (300 mg  total) by mouth at bedtime.  Marland Kitchen spironolactone (ALDACTONE) 25 MG tablet TAKE 1/2 (ONE-HALF) TABLET BY MOUTH DAILY   No current  facility-administered medications on file prior to visit.    ROS  Constitutional: Afebrile, no chills, well hydrated and well nourished Gastrointestinal: negative Musculoskeletal:negative Neurological: negative Behavioral/Psych: negative  PFSH  Medical:  Ms. Knoke  has a past medical history of Anxiety; Depression; Chronic kidney disease; Thyroid disease; Allergy; and Sleep apnea. Family: family history includes Alcohol abuse in her father; Depression in her father and sister; Heart attack in her father; Hypertension in her mother and sister; Post-traumatic stress disorder in her father; Rheum arthritis in her sister; Stroke in her mother. Surgical:  has past surgical history that includes Abdominal hysterectomy; Cesarean section; Replacement total knee (Left); and Hip surgery (Right). Tobacco:  reports that she has never smoked. She has never used smokeless tobacco. Alcohol:  reports that she does not drink alcohol. Drug:  reports that she does not use illicit drugs.  Physical Exam  Vitals:  Today's Vitals   02/09/15 1324 02/09/15 1325  BP: 140/77   Pulse: 87   Temp: 98.1 F (36.7 C)   TempSrc: Oral   Resp: 18   Height: 5\' 5"  (1.651 m)   Weight: 270 lb (122.471 kg)   SpO2: 98%   PainSc:  9     Calculated BMI: Body mass index is 44.93 kg/(m^2).  General appearance: alert, cooperative, appears stated age, moderate distress and morbidly obese Eyes: PERLA Respiratory: No evidence respiratory distress, no audible rales or ronchi and no use of accessory muscles of respiration  Cervical Spine Inspection: Normal anatomy Alignment: Symetrical ROM: Adequate  Upper Extremities Inspection: No gross anomalies detected ROM: Adequate Sensory: Normal Motor: Unremarkable  Thoracic Spine Inspection: No gross anomalies detected Alignment: Symetrical ROM: Adequate Palpation: WNL  Lumbar Spine Inspection: No gross anomalies detected Alignment: Symetrical ROM:  Decreased Palpation: WNL Provocative Tests:  Lumbar Hyperextension and rotation test:  Unable to perform due to hip pain Patrick's Maneuver: Unable to perform due to hip pain Gait: Antalgic (limping) she is using a walker to ambulate.  Lower Extremities Inspection: No gross anomalies detected ROM: Decreased decreased on both hips, but especially on the left side where she seems to have almost 0 range of motion before she starts time pain. Sensory:  Normal Motor: Weakness, this appears to be a combination of deconditioning and guarding.  Assessment & Plan  Primary Diagnosis & Pertinent Problem List: The primary encounter diagnosis was Left hip pain. 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  Visit Diagnosis: 1. Left hip pain   2. Chronic pain   3. History of total knee replacement (Left)   4. History of femur fracture (Right)   5. Primary osteoarthritis of both knees   6. Primary osteoarthritis of both hips   7. 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 She is unable to lay flat on her back or on her left side, secondary to the pain. Her range of motion today is significantly decreased with pain in the buttocks area and goes all the way through her into the groin area. Today we'll be sending her to have some  x-rays of the hips and I will tentatively bring her back for an intra-articular hip injection.    Plan of Care  Pharmacotherapy (Medications Ordered): No orders of the defined types were placed in this encounter.    Lab-work & Procedure Ordered: Orders Placed This Encounter  Procedures  . HIP INJECTION    Standing Status:  Future     Number of Occurrences:      Standing Expiration Date: 02/09/2016    Scheduling Instructions:     Side: Left-sided     Sedation: No Sedation.     Timeframe: ASAA  . DG HIP UNILAT W OR W/O PELVIS 2-3 VIEWS LEFT    Standing Status: Future     Number of Occurrences: 1     Standing Expiration Date: 02/09/2016    Order Specific Question:  Reason for Exam (SYMPTOM  OR DIAGNOSIS REQUIRED)    Answer:  Left hip pain/arthralgia    Order Specific Question:  Preferred imaging location?    Answer:  [facility]    Order Specific Question:  Call Results- Best Contact Number?    Answer:  [phone] (Pain Clinic facility) (Dr. Laban Emperor)  . DG HIP UNILAT W OR W/O PELVIS 2-3 VIEWS RIGHT    Standing Status: Future     Number of Occurrences: 1     Standing Expiration Date: 02/09/2016    Order Specific Question:  Reason for Exam (SYMPTOM  OR DIAGNOSIS REQUIRED)    Answer:  Left hip pain/arthralgia    Order Specific Question:  Preferred imaging location?    Answer:  [facility]    Order Specific Question:  Call Results- Best Contact Number?    Answer:  [phone] (Pain Clinic facility) (Dr. Laban Emperor)    Imaging Ordered: None  Interventional Therapies: Scheduled: Left intra-articular hip injection under fluoroscopic guidance, no sedation. PRN Procedures: Bilateral lumbar facet block under fluoroscopic guidance and IV sedation    Referral(s) or Consult(s): None at this point.  Medications administered during this visit: Ms. Mosher had no medications administered during this visit.  Future Appointments Date Time Provider Department Center  03/17/2015 11:20 AM Delano Metz, MD [facility] None  04/27/2015 10:00 AM Delano Metz, MD [facility] None  04/29/2015 2:15 PM Kerin Salen, MD [facility] None    Primary Care Physician: Leotis Shames, MD Location: [facility] Note by: Sydnee Levans. Laban Emperor, M.D, DABA, DABAPM, DABPM, DABIPP,  FIPP

## 2015-02-09 NOTE — Progress Notes (Signed)
Safety precautions to be maintained throughout the outpatient stay will include: orient to surroundings, keep bed in low position, maintain call bell within reach at all times, provide assistance with transfer out of bed and ambulation.  Pt here for eval of procedure- Did not bring diary back with her today Pt having hip xray today

## 2015-02-09 NOTE — Assessment & Plan Note (Signed)
The patient comes in today with an acute exacerbation of the left hip pain. She is unable to lay flat on her back or on her left side, secondary to the pain. Her range of motion today is significantly decreased with pain in the buttocks area and goes all the way through her into the groin area. Today we'll be sending her to have some x-rays of the hips and I will tentatively bring her back for an intra-articular hip injection.

## 2015-02-11 ENCOUNTER — Telehealth: Payer: Self-pay | Admitting: Pain Medicine

## 2015-02-11 NOTE — Telephone Encounter (Signed)
Would like to know results of Hip x-ray, please call patient with results

## 2015-02-12 NOTE — Telephone Encounter (Signed)
Results of x-rays read to patient. 

## 2015-02-19 NOTE — Progress Notes (Signed)
Quick Note:  X-rays of the left hip demonstrate degenerative changes with some osteophytes (bone spurs) with mild loss of joint space. Loss of joint space is indicative of loss of cartilage. Pain in this joint can be also treated with intra-articular hip injections using steroids, no more than 2 per year. Keeping the BMI below 30 can assist in prolonging the duration of the joint before replacement is required. ______

## 2015-02-19 NOTE — Progress Notes (Signed)
Quick Note:  The x-rays indicate that there is moderate symmetrical narrowing of the right hip joint with osteoarthritis. No acute injury in the right hip. Findings are compatible with degenerative joint disease of the right hip. This can be treated with intra-articular hip injections using local anesthetic and steroids, no more than 2 per year, but eventually it will continue to deteriorate possibly requiring total hip replacement. Keeping a BMI below 30 can assist in prolonging the duration of the joint. ______

## 2015-03-16 ENCOUNTER — Other Ambulatory Visit: Payer: Self-pay

## 2015-03-17 ENCOUNTER — Ambulatory Visit: Payer: Medicare Other | Attending: Pain Medicine | Admitting: Pain Medicine

## 2015-03-17 ENCOUNTER — Encounter: Payer: Self-pay | Admitting: Pain Medicine

## 2015-03-17 VITALS — BP 103/50 | HR 84 | Temp 97.2°F | Resp 18 | Ht 65.0 in | Wt 270.0 lb

## 2015-03-17 DIAGNOSIS — M160 Bilateral primary osteoarthritis of hip: Secondary | ICD-10-CM | POA: Diagnosis not present

## 2015-03-17 DIAGNOSIS — I129 Hypertensive chronic kidney disease with stage 1 through stage 4 chronic kidney disease, or unspecified chronic kidney disease: Secondary | ICD-10-CM | POA: Insufficient documentation

## 2015-03-17 DIAGNOSIS — F419 Anxiety disorder, unspecified: Secondary | ICD-10-CM | POA: Diagnosis not present

## 2015-03-17 DIAGNOSIS — Z96652 Presence of left artificial knee joint: Secondary | ICD-10-CM | POA: Insufficient documentation

## 2015-03-17 DIAGNOSIS — M069 Rheumatoid arthritis, unspecified: Secondary | ICD-10-CM | POA: Diagnosis not present

## 2015-03-17 DIAGNOSIS — I509 Heart failure, unspecified: Secondary | ICD-10-CM | POA: Diagnosis not present

## 2015-03-17 DIAGNOSIS — M4807 Spinal stenosis, lumbosacral region: Secondary | ICD-10-CM | POA: Insufficient documentation

## 2015-03-17 DIAGNOSIS — Z79891 Long term (current) use of opiate analgesic: Secondary | ICD-10-CM | POA: Insufficient documentation

## 2015-03-17 DIAGNOSIS — F329 Major depressive disorder, single episode, unspecified: Secondary | ICD-10-CM | POA: Insufficient documentation

## 2015-03-17 DIAGNOSIS — E785 Hyperlipidemia, unspecified: Secondary | ICD-10-CM | POA: Diagnosis not present

## 2015-03-17 DIAGNOSIS — M170 Bilateral primary osteoarthritis of knee: Secondary | ICD-10-CM | POA: Diagnosis not present

## 2015-03-17 DIAGNOSIS — M4806 Spinal stenosis, lumbar region: Secondary | ICD-10-CM | POA: Diagnosis not present

## 2015-03-17 DIAGNOSIS — E039 Hypothyroidism, unspecified: Secondary | ICD-10-CM | POA: Insufficient documentation

## 2015-03-17 DIAGNOSIS — M25552 Pain in left hip: Secondary | ICD-10-CM | POA: Insufficient documentation

## 2015-03-17 DIAGNOSIS — M25559 Pain in unspecified hip: Secondary | ICD-10-CM | POA: Diagnosis present

## 2015-03-17 DIAGNOSIS — M1711 Unilateral primary osteoarthritis, right knee: Secondary | ICD-10-CM | POA: Diagnosis not present

## 2015-03-17 DIAGNOSIS — E119 Type 2 diabetes mellitus without complications: Secondary | ICD-10-CM | POA: Insufficient documentation

## 2015-03-17 DIAGNOSIS — G8929 Other chronic pain: Secondary | ICD-10-CM | POA: Insufficient documentation

## 2015-03-17 DIAGNOSIS — K219 Gastro-esophageal reflux disease without esophagitis: Secondary | ICD-10-CM | POA: Diagnosis not present

## 2015-03-17 MED ORDER — MIDAZOLAM HCL 5 MG/5ML IJ SOLN
INTRAMUSCULAR | Status: AC
  Administered 2015-03-17: 2 mg via INTRAVENOUS
  Filled 2015-03-17: qty 5

## 2015-03-17 MED ORDER — IOHEXOL 180 MG/ML  SOLN: 10.0000 mL | Freq: Once | INTRAMUSCULAR | Status: DC | PRN

## 2015-03-17 MED ORDER — FENTANYL CITRATE (PF) 100 MCG/2ML IJ SOLN
INTRAMUSCULAR | Status: AC
  Administered 2015-03-17: 50 ug via INTRAVENOUS
  Filled 2015-03-17: qty 2

## 2015-03-17 MED ORDER — FENTANYL CITRATE (PF) 100 MCG/2ML IJ SOLN: 100.0000 ug | INTRAMUSCULAR | Status: DC

## 2015-03-17 MED ORDER — ROPIVACAINE HCL 2 MG/ML IJ SOLN
INTRAMUSCULAR | Status: AC
  Administered 2015-03-17: 13:00:00
  Filled 2015-03-17: qty 10

## 2015-03-17 MED ORDER — METHYLPREDNISOLONE ACETATE 80 MG/ML IJ SUSP: 80.0000 mg | Freq: Once | INTRAMUSCULAR | Status: DC

## 2015-03-17 MED ORDER — LACTATED RINGERS IV SOLN: 1000.0000 mL | INTRAVENOUS | Status: AC

## 2015-03-17 MED ORDER — MIDAZOLAM HCL 5 MG/5ML IJ SOLN: 5.0000 mg | INTRAMUSCULAR | Status: DC

## 2015-03-17 MED ORDER — LIDOCAINE HCL (PF) 1 % IJ SOLN
INTRAMUSCULAR | Status: AC
  Administered 2015-03-17: 13:00:00
  Filled 2015-03-17: qty 5

## 2015-03-17 MED ORDER — LIDOCAINE HCL (PF) 1 % IJ SOLN: 10.0000 mL | Freq: Once | INTRAMUSCULAR | Status: DC

## 2015-03-17 MED ORDER — IOHEXOL 180 MG/ML  SOLN
INTRAMUSCULAR | Status: AC
  Administered 2015-03-17: 13:00:00
  Filled 2015-03-17: qty 20

## 2015-03-17 MED ORDER — METHYLPREDNISOLONE ACETATE 80 MG/ML IJ SUSP
INTRAMUSCULAR | Status: AC
  Administered 2015-03-17: 13:00:00
  Filled 2015-03-17: qty 1

## 2015-03-17 MED ORDER — ROPIVACAINE HCL 2 MG/ML IJ SOLN: 4.0000 mL | Freq: Once | INTRAMUSCULAR | Status: DC

## 2015-03-17 NOTE — Progress Notes (Signed)
Safety precautions to be maintained throughout the outpatient stay will include: orient to surroundings, keep bed in low position, maintain call bell within reach at all times, provide assistance with transfer out of bed and ambulation.  

## 2015-03-17 NOTE — Progress Notes (Signed)
[name]'s Name: Laura [name] MRN: [id number] DOB: 1945-11-05 DOS: 03/17/2015  Primary Reason(s) for Visit: Interventional Pain Management Treatment. CC: Hip Pain  Pre-Procedure Assessment:  Laura [name] is a 70 y.o. year old, female [name], seen today for interventional treatment. She has Acquired hypothyroidism; Anxiety; Chronic kidney disease (CKD), stage III (moderate); Clinical depression; Diabetes mellitus, type 2 ([place]); Essential (primary) hypertension; Acid reflux; Adult hypothyroidism; HLD (hyperlipidemia); Gastro-esophageal reflux disease without esophagitis; Arthritis, degenerative; Extreme obesity ([place]); Cannot sleep; Rheumatoid arthritis without elevated rheumatoid factor ([place]); Seizure ([employer]); Memory loss, short term; Congestive heart failure with left ventricular systolic dysfunction ([place]); Type 2 diabetes mellitus ([place]); Morbid obesity ([place]); Seronegative rheumatoid arthritis ([place]); Chronic pain; Chronic knee pain (Right); Osteoarthritis of knee (Right); Opiate use; Long term current use of opiate analgesic; Long term prescription opiate use; Encounter for therapeutic drug level monitoring; Opioid dependence ([place]); Lumbar spinal stenosis (5 mm Severe L3-4; 8 mm L4-5); Lumbar spondylosis; Lumbar facet syndrome (Location of Primary Source of Pain) (Bilateral) (L>R); Lumbar spinal stenosis with neurogenic claudication; Chronic pain syndrome; Chronic low back pain (Location of Primary Source of Pain) (Bilateral) (L>R); Acute hip pain (Left); History of total knee replacement (Left); History of femur fracture (Right); 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); 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 Her primarily concern today is Laura Hip Pain   Today's Initial Pain Score: 7/10 Reported level of pain is incompatible with clinical obrservations. This may be secondary to a possible lack of understanding on how Laura  pain scale works. Pain Descriptors / Indicators: Jabbing, Aching, Discomfort Pain Frequency: Constant  Post-procedure Pain Score: 2   Date of Last Visit: 02/09/15 Service Provided on Last Visit: Evaluation, Med Refill  Verification of Laura correct person, correct site (including marking of site), and correct procedure were performed and confirmed by Laura [name].  Today's Vitals   03/17/15 1259 03/17/15 1309 03/17/15 1319 03/17/15 1330  BP: 122/74 125/61 110/96 103/50  Pulse: 81 73 92 84  Temp:  98 F (36.7 C)  97.2 F (36.2 C)  TempSrc:  Temporal    Resp: '16 18  18  ' Height:      Weight:      SpO2: 91% 91% 98% 100%  PainSc: 4    2   Calculated BMI: Body mass index is 44.93 kg/(m^2). Allergies: She is allergic to penicillins.. Primary Diagnosis: Left hip pain [M25.552]  Procedure:  Type: Diagnostic Intra-Articular Hip Injection Region:  Posterolateral hip joint area. Level: Lower pelvic and hip joint level. Laterality: Left-Sided  Indications: 1. Acute hip pain (Left)   2. Primary osteoarthritis of both hips     In addition, Laura [name] has Arthritis, degenerative; Rheumatoid arthritis without elevated rheumatoid factor ([place]); Seronegative rheumatoid arthritis ([place]); Chronic pain; Chronic knee pain (Right); Osteoarthritis of knee (Right); Lumbar spinal stenosis (5 mm Severe L3-4; 8 mm L4-5); Lumbar spondylosis; Lumbar facet syndrome (Location of Primary Source of Pain) (Bilateral) (L>R); Lumbar spinal stenosis with neurogenic claudication; Chronic pain syndrome; Chronic low back pain (Location of Primary Source of Pain) (Bilateral) (L>R); Acute hip pain (Left); History of total knee replacement (Left); History of femur fracture (Right); 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); Osteoarthritis of hips (Bilateral)  (L>R); and 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  Consent: Secured. Under Laura influence of no sedatives a written  informed consent was obtained, after having provided information on Laura risks and possible complications. To fulfill  our ethical and legal obligations, as recommended by Laura American Medical Association's Code of Ethics, we have provided information to Laura [name] about our clinical impression; Laura nature and purpose of Laura treatment or procedure; Laura risks, benefits, and possible complications of Laura intervention; alternatives; Laura risk(s) and benefit(s) of Laura alternative treatment(s) or procedure(s); and Laura risk(s) and benefit(s) of doing nothing. Laura [name] was provided information about Laura risks and possible complications associated with Laura procedure. These include, but are not limited to, failure to achieve desired goals, infection, bleeding, organ or nerve damage, allergic reactions, paralysis, and death. In Laura case of intra- or periarticular procedures these may include, but are not limited to, failure to achieve desired goals, infection, bleeding (hemarthrosis), organ or nerve damage, allergic reactions, and death. In addition, Laura [name] was informed that Medicine is not an exact science; therefore, there is also Laura possibility of unforeseen risks and possible complications that may result in a catastrophic outcome. Laura [name] indicated having understood very clearly. We have given Laura [name] no guarantees and we have made no promises. Enough time was given to Laura [name] to ask questions, all of which were answered to Laura [name]'s satisfaction.  Pre-Procedure Preparation: Safety Precautions: Allergies reviewed. Appropriate site, procedure, and [name] were confirmed by following Laura Joint Commission's Universal Protocol (UP.01.01.01), in Laura form of a "Time Out". Laura [name] was asked to confirm marked site and procedure, before commencing. Laura [name] was asked about blood thinners, or active infections, both of which were denied. [name] was assessed for positional comfort and all pressure  points were checked before starting procedure. Monitoring:  As per clinic protocol. Infection Control Precautions: Sterile technique used. Standard Universal Precautions were taken as recommended by Laura Department of [facility] for Disease Control and Prevention (CDC). Standard pre-surgical skin prep was conducted. Respiratory hygiene and cough etiquette was practiced. Hand hygiene observed. Safe injection practices and needle disposal techniques followed. SDV (single dose vial) medications used. Medications properly checked for expiration dates and contaminants. Personal protective equipment (PPE) used: Radiation resistant gloves.  Anesthesia, Analgesia, Anxiolysis: Type: Moderate (Conscious) Sedation & Local Anesthesia Local Anesthetic: Lidocaine 1% Route: Intravenous (IV) IV Access: Secured Sedation: Meaningful verbal contact was maintained at all times during Laura procedure  Indication(s): Analgesia & Anxiolysis  Description of Procedure Process:  Time-out: "Time-out" completed before starting procedure, as per protocol. Position: Right Lateral Decubitus. Target Area: Superior aspect of Laura hip joint cavity, going thru Laura superior portion of Laura capsular ligament. Approach: Posterior approach. Area Prepped: Entire Posterolateral hip area. Prepping solution: ChloraPrep (2% chlorhexidine gluconate and 70% isopropyl alcohol) Safety Precautions: Aspiration looking for blood return was conducted prior to all injections. At no point did we inject any substances, as a needle was being advanced. No attempts were made at seeking any paresthesias. Safe injection practices and needle disposal techniques used. Medications properly checked for expiration dates. SDV (single dose vial) medications used.   Description of Laura Procedure: Protocol guidelines were followed. Laura [name] was placed in position over Laura fluoroscopy table. Laura target area was identified and Laura area prepped in Laura usual  manner. Skin & deeper tissues infiltrated with local anesthetic. Appropriate amount of time allowed to pass for local anesthetics to take effect. Laura procedure needles were then advanced to Laura target area. Proper needle placement secured. Negative aspiration confirmed. Solution injected in intermittent fashion, asking for systemic symptoms every 0.5cc of injectate. Laura needles were then removed and Laura area cleansed, making sure to  leave some of Laura prepping solution back to take advantage of its long term bactericidal properties. EBL: Minimal Materials & Medications Used:  Needle(s) Used: 22g - 7" Spinal Needle(s) Solution Injected: 0.2% PF-Ropivacaine (51m) + SDV-DepoMedrol 80 mg/ml (141m Medications Administered today: We administered ropivacaine (PF) 2 mg/ml (0.2%), lidocaine (PF), methylPREDNISolone acetate, fentaNYL, midazolam, and iohexol.Please see chart orders for dosing details.  Imaging Guidance:  Type of Imaging Technique: Fluoroscopy Guidance (Non-spinal) Indication(s): Assistance in needle guidance and placement for procedures requiring needle placement in or near specific anatomical locations not easily accessible without such assistance. Exposure Time: Please see nurses notes. Contrast: Before injecting any contrast, we confirmed that Laura [name] did not have an allergy to iodine, shellfish, or radiological contrast. Once satisfactory needle placement was completed at Laura desired level, radiological contrast was injected. Injection was conducted under continuous fluoroscopic guidance. Injection of contrast accomplished without complications. See chart for type and volume of contrast used. Fluoroscopic Guidance: I was personally present in Laura fluoroscopy suite, where Laura [name] was placed in position for Laura procedure, over Laura fluoroscopy-compatible table. Fluoroscopy was manipulated, using "Tunnel Vision Technique", to obtain Laura best possible view of Laura target area, on Laura affected  side. Parallax error was corrected before commencing Laura procedure. A "direction-depth-direction" technique was used to introduce Laura needle under continuous pulsed fluoroscopic guidance. Once Laura target was reached, antero-posterior, oblique, and lateral fluoroscopic projection views were taken to confirm needle placement in all planes. Permanently recorded images stored by scanning into EMR. Interpretation: Intraoperative imaging interpretation by performing Physician. Adequate needle placement confirmed. Needle placement confirmed in AP, lateral, & Oblique Views. Appropriate spread of contrast to desired area. No evidence of afferent or efferent intravascular uptake. Permanent hardcopy images in multiple planes scanned into Laura [name]'s record.  Antibiotic Prophylaxis:  Indication(s): No indications identified. Type:  Antibiotics Given (last 72 hours)    None       Post-operative Assessment:  Complications: No immediate post-treatment complications were observed. Disposition: Laura [name] was discharged home, once institutional criteria were met. Return to clinic in 2 weeks for follow-up evaluation and interpretation of results. Laura [name] tolerated Laura entire procedure well. A repeat set of vitals were taken after Laura procedure and Laura [name] was kept under observation following institutional policy, for this type of procedure. Post-procedural neurological assessment was performed, showing return to baseline, prior to discharge. Laura [name] was provided with post-procedure discharge instructions, including a section on how to identify potential problems. Should any problems arise concerning this procedure, Laura [name] was given instructions to immediately contact us[place]oreaat any time, without hesitation. In any case, we plan to contact Laura [name] by telephone for a follow-up status report regarding this interventional procedure. Comments:  No additional relevant information.  Medications  administered during this visit: We administered ropivacaine (PF) 2 mg/ml (0.2%), lidocaine (PF), methylPREDNISolone acetate, fentaNYL, midazolam, and iohexol.  Future Appointments Date Time Provider DeGunbarrel3/27/2017 11:20 AM FrMilinda PointerMD [facility] None  04/22/2015 1:00 PM HiElvin SoMD [facility] None  04/29/2015 11:20 AM [facility]    Primary Care Physician: SiGlendon AxeMD [facility] Note by: FrKathlen BrunswickNaDossie ArbourM.D, DABA, DABAPM, DABPM, DABIPP, FIPP  Disclaimer:  Medicine is not an exact science. Laura only guarantee in medicine is that nothing is guaranteed. It is important to note that Laura decision to proceed with this intervention was based on Laura information collected from Laura [name]. Laura Data and conclusions were  drawn from Laura [name]'s questionnaire, Laura interview, and Laura physical examination. Because Laura information was provided in large part by Laura [name], it cannot be guaranteed that it has not been purposely or unconsciously manipulated. Every effort has been made to obtain as much relevant data as possible for this evaluation. It is important to note that Laura conclusions that lead to this procedure are derived in large part from Laura available data. Always take into account that Laura treatment will also be dependent on availability of resources and existing treatment guidelines, considered by other Pain Management Practitioners as being common knowledge and practice, at Laura time of Laura intervention. For Medico-Legal purposes, it is also important to point out that variation in procedural techniques and pharmacological choices are Laura acceptable norm. Laura indications, contraindications, technique, and results of Laura above procedure should only be interpreted and judged by a Board-Certified Interventional Pain Specialist with extensive familiarity and expertise in Laura same exact procedure and  technique. Attempts at providing opinions without similar or greater experience and expertise than that of Laura treating physician will be considered as inappropriate and unethical, and shall result in a formal complaint to Laura state medical board and applicable specialty societies.

## 2015-03-17 NOTE — Patient Instructions (Signed)
IMPORTANT: Please fill out the post procedure pain diary and bring it with you at your next appointment.  Pain Management Discharge Instructions  General Discharge Instructions :  If you need to reach your doctor call: Monday-Friday 8:00 am - 4:00 pm at [phone] or toll free [phone].  After clinic hours [phone] to have operator reach doctor.  Bring all of your medication bottles to all your appointments in the pain clinic.  To cancel or reschedule your appointment with Pain Management please remember to call 24 hours in advance to avoid a fee.  Refer to the educational materials which you have been given on: General Risks, I had my Procedure. Discharge Instructions, Post Sedation.  Post Procedure Instructions:  The drugs you were given will stay in your system until tomorrow, so for the next 24 hours you should not drive, make any legal decisions or drink any alcoholic beverages.  You may eat anything you prefer, but it is better to start with liquids then soups and crackers, and gradually work up to solid foods.  Please notify your doctor immediately if you have any unusual bleeding, trouble breathing or pain that is not related to your normal pain.  Depending on the type of procedure that was done, some parts of your body may feel week and/or numb.  This usually clears up by tonight or the next day.  Walk with the use of an assistive device or accompanied by an adult for the 24 hours.  You may use ice on the affected area for the first 24 hours.  Put ice in a Ziploc bag and cover with a towel and place against area 15 minutes on 15 minutes off.  You may switch to heat after 24 hours. 

## 2015-03-18 ENCOUNTER — Telehealth: Payer: Self-pay | Admitting: *Deleted

## 2015-03-18 NOTE — Telephone Encounter (Signed)
Message left

## 2015-04-06 ENCOUNTER — Encounter: Payer: Self-pay | Admitting: Pain Medicine

## 2015-04-06 ENCOUNTER — Ambulatory Visit: Payer: Medicare Other | Attending: Pain Medicine | Admitting: Pain Medicine

## 2015-04-06 VITALS — BP 140/85 | HR 93 | Temp 98.6°F | Resp 16 | Ht 65.0 in | Wt 270.0 lb

## 2015-04-06 DIAGNOSIS — F329 Major depressive disorder, single episode, unspecified: Secondary | ICD-10-CM | POA: Diagnosis not present

## 2015-04-06 DIAGNOSIS — E119 Type 2 diabetes mellitus without complications: Secondary | ICD-10-CM | POA: Insufficient documentation

## 2015-04-06 DIAGNOSIS — K219 Gastro-esophageal reflux disease without esophagitis: Secondary | ICD-10-CM | POA: Insufficient documentation

## 2015-04-06 DIAGNOSIS — Z90710 Acquired absence of both cervix and uterus: Secondary | ICD-10-CM | POA: Insufficient documentation

## 2015-04-06 DIAGNOSIS — F419 Anxiety disorder, unspecified: Secondary | ICD-10-CM | POA: Insufficient documentation

## 2015-04-06 DIAGNOSIS — M160 Bilateral primary osteoarthritis of hip: Secondary | ICD-10-CM | POA: Insufficient documentation

## 2015-04-06 DIAGNOSIS — M25552 Pain in left hip: Secondary | ICD-10-CM

## 2015-04-06 DIAGNOSIS — M1711 Unilateral primary osteoarthritis, right knee: Secondary | ICD-10-CM | POA: Diagnosis not present

## 2015-04-06 DIAGNOSIS — M4806 Spinal stenosis, lumbar region: Secondary | ICD-10-CM | POA: Diagnosis not present

## 2015-04-06 DIAGNOSIS — E039 Hypothyroidism, unspecified: Secondary | ICD-10-CM | POA: Diagnosis not present

## 2015-04-06 DIAGNOSIS — Z79891 Long term (current) use of opiate analgesic: Secondary | ICD-10-CM | POA: Insufficient documentation

## 2015-04-06 DIAGNOSIS — M25559 Pain in unspecified hip: Secondary | ICD-10-CM | POA: Diagnosis present

## 2015-04-06 DIAGNOSIS — M545 Low back pain: Secondary | ICD-10-CM | POA: Insufficient documentation

## 2015-04-06 DIAGNOSIS — G8929 Other chronic pain: Secondary | ICD-10-CM | POA: Diagnosis not present

## 2015-04-06 DIAGNOSIS — F1190 Opioid use, unspecified, uncomplicated: Secondary | ICD-10-CM

## 2015-04-06 DIAGNOSIS — E785 Hyperlipidemia, unspecified: Secondary | ICD-10-CM | POA: Diagnosis not present

## 2015-04-06 DIAGNOSIS — I129 Hypertensive chronic kidney disease with stage 1 through stage 4 chronic kidney disease, or unspecified chronic kidney disease: Secondary | ICD-10-CM | POA: Diagnosis not present

## 2015-04-06 NOTE — Progress Notes (Signed)
Safety precautions to be maintained throughout the outpatient stay will include: orient to surroundings, keep bed in low position, maintain call bell within reach at all times, provide assistance with transfer out of bed and ambulation.  

## 2015-04-06 NOTE — Progress Notes (Signed)
[name] MRN: [id number] DOB: [birth year] DOS: 04/06/2015  Primary Reason(s) for Visit: Post-Procedure evaluation CC: Hip Pain   HPI  Laura [name] is a 70 y.o. year old, female patient, who returns today as an established patient. She has Acquired hypothyroidism; Anxiety; Chronic kidney disease (CKD), stage III (moderate); Clinical depression; Diabetes mellitus, type 2 (HCC); Essential (primary) hypertension; Acid reflux; Adult hypothyroidism; HLD (hyperlipidemia); Gastro-esophageal reflux disease without esophagitis; Extreme obesity (HCC); Cannot sleep; Rheumatoid arthritis without elevated rheumatoid factor (HCC); Seizure (HCC); Memory loss, short term; Congestive heart failure with left ventricular systolic dysfunction (HCC); Type 2 diabetes mellitus (HCC); Morbid obesity (HCC); Seronegative rheumatoid arthritis (HCC); Chronic pain; Chronic knee pain (Right); Osteoarthritis of knee (Right); Opiate use (30 MME/Day); Long term current use of opiate analgesic; Long term prescription opiate use; Encounter for therapeutic drug level monitoring; Opioid dependence (HCC); Lumbar spinal stenosis (5 mm Severe L3-4; 8 mm L4-5); Lumbar spondylosis; Lumbar facet syndrome (Location of Primary Source of Pain) (Bilateral) (L>R); Lumbar spinal stenosis with neurogenic claudication; Chronic pain syndrome; Chronic low back pain (Location of Primary Source of Pain) (Bilateral) (L>R); History of total knee replacement (Left); History of femur fracture (Right); 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); 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 Her primarily concern today is the Hip Pain   The patient returns to the clinics today after having had a diagnostic left sided intra-articular hip injection under fluoroscopic guidance, without sedation. She is extremely happy and she indicates having 100%  relief of her hip pain.  Pain Assessment: Self-Reported Pain Score: 0-No pain Reported level is compatible with observation    Date of Last Visit: 03/17/15  Diagnostic left intra-articular hip injection with local anesthetic and steroids on 03/17/2015  Controlled Substance Pharmacotherapy Assessment  Analgesic: Oxycodone IR 5 mg every 6 hours (20 mg/day) Pill Count: The patient did not bring her pills to the visit today since this was not a medication refill appointment. MME/day: 30 mg/day Pharmacokinetics: Onset of action (Liberation/Absorption): Within expected pharmacological parameters Time to Peak effect (Distribution): Timing and results are as within normal expected parameters Duration of action (Metabolism/Excretion): Within normal limits for medication Pharmacodynamics: Analgesic Effect: More than 50% Activity Facilitation: Medication(s) allow patient to sit, stand, walk, and do the basic ADLs Perceived Effectiveness: Described as relatively effective, allowing for increase in activities of daily living (ADL) Side-effects or Adverse reactions: None reported Monitoring: [place] PMP: Online review of the past 81-month period conducted. Compliant with practice rules and regulations UDS Results/interpretation: The patient's last UDS was done on 11/05/2014 and it came back abnormal due to the fact that it had temazepam which was not clear. We took the opportunity to informed the patient about the CDC guidelines. Medication Assessment Form: Reviewed. Patient indicates being compliant with therapy Treatment compliance: Compliant Risk Assessment: Aberrant Behavior: None observed today Substance Use Disorder (SUD) Risk Level: Low Opioid Risk Tool (ORT) Score:  3 Low Risk for SUD (Score <3) Depression Scale Score: PHQ-2: PHQ-2 Total Score: 0 No depression (0) PHQ-9: PHQ-9 Total Score: 0 No depression (0-4)  Pharmacologic Plan: No change in therapy, at this time   Laboratory Workup   Last ED UDS: No results found for: THCU, COCAINSCRNUR, PCPSCRNUR, MDMA, AMPHETMU, METHADONE, ETOH  Inflammation Markers Lab Results  Component Value Date   ESRSEDRATE 10 04/04/2012    Renal Function Lab Results  Component Value Date   BUN 20* 06/12/2013  CREATININE 1.42* 06/12/2013   GFRAA 44* 06/12/2013   GFRNONAA 38* 06/12/2013    Hepatic Function No results found for: AST, ALT, ALBUMIN  Electrolytes Lab Results  Component Value Date   NA 138 06/12/2013   K 4.1 06/12/2013   CL 102 06/12/2013   CALCIUM 8.6 06/12/2013   MG 2.0 06/19/2012    Post-Procedure Assessment  Procedure done on last visit: Diagnostic left intra-articular hip injection with local anesthetic and steroid. Side-effects or Adverse reactions: None reported Sedation: No sedation used  Results: Ultra-Short Term Relief (First 1 hour after procedure): 100 %  No IV Analgesics or Anxiolytics given, therefore the benefit is completely due to Local Anesthetics Short Term Relief (Initial 4-6 hrs after procedure): 100 % Complete relief confirms area to be the source of pain Long Term Relief : 100 % Long-term benefit would suggest an inflammatory etiology to the pain   Current Relief (Now): 100%  Persistent relief would suggest effective anti-inflammatory effects from steroids Interpretation of Results: The results of this treatment clearly supports the use of this type of therapy as a palliative treatment of her left hip pain.  Allergies  Laura [name] is allergic to penicillins.  Meds  The patient has a current medication list which includes the following prescription(s): amlodipine, aspirin ec, vitamin d, doxycycline, duloxetine, esomeprazole, furosemide, hydroxychloroquine, levothyroxine, levothyroxine, loratadine, metoprolol succinate, oxycodone, quetiapine, spironolactone, and tramadol.  Current Outpatient Prescriptions on File Prior to Visit  Medication Sig  . amLODipine (NORVASC) 10 MG tablet Take  5 mg by mouth 2 (two) times daily.   Marland Kitchen aspirin EC 81 MG tablet Take by mouth.  . Cholecalciferol (VITAMIN D) 2000 UNITS tablet Take by mouth daily.   . DULoxetine (CYMBALTA) 30 MG capsule Two tablets in the morning, and one in the evening.  Marland Kitchen esomeprazole (NEXIUM) 20 MG capsule Take by mouth daily.   . furosemide (LASIX) 40 MG tablet TAKE 1 TABLET ONCE DAILY  . hydroxychloroquine (PLAQUENIL) 200 MG tablet TAKE 1 TABLET BY MOUTH TWICE A  DAY AS DIRECTED  . levothyroxine (SYNTHROID, LEVOTHROID) 200 MCG tablet TAKE 1 TABLET ONCE DAILY- ON AN EMPTY STOMACH WITH A GLASS OF WATER 30-60 MINUTES BEFORE BREAKFAST  . levothyroxine (SYNTHROID, LEVOTHROID) 25 MCG tablet TAKE 1 TABLET BY MOUTH EVERY DAY (TAKE WITH 20O MCG TABLET TO EQUAL A 225 MCG DOSE)  . loratadine (CLARITIN) 10 MG tablet Take by mouth daily as needed.   . metoprolol succinate (TOPROL-XL) 50 MG 24 hr tablet TAKE 1 TABLET (50 MG TOTAL) BY MOUTH ONCE DAILY.  Marland Kitchen oxyCODONE (OXY IR/ROXICODONE) 5 MG immediate release tablet Take 1 tablet (5 mg total) by mouth every 6 (six) hours as needed for moderate pain or severe pain.  Marland Kitchen QUEtiapine (SEROQUEL) 300 MG tablet Take 1 tablet (300 mg total) by mouth at bedtime.  Marland Kitchen spironolactone (ALDACTONE) 25 MG tablet TAKE 1/2 (ONE-HALF) TABLET BY MOUTH DAILY   No current facility-administered medications on file prior to visit.    ROS  Constitutional: Afebrile, no chills, well hydrated and well nourished Gastrointestinal: negative Musculoskeletal:negative Neurological: negative Behavioral/Psych: negative  PFSH  Medical:  Ms. Busk  has a past medical history of Anxiety; Depression; Chronic kidney disease; Thyroid disease; Allergy; Sleep apnea; and Arthritis, degenerative (10/08/2013). Family: family history includes Alcohol abuse in her father; Depression in her father and sister; Heart attack in her father; Hypertension in her mother and sister; Post-traumatic stress disorder in her father; Rheum  arthritis in her sister; Stroke in her mother.  Surgical:  has past surgical history that includes Abdominal hysterectomy; Cesarean section; Replacement total knee (Left); and Hip surgery (Right). Tobacco:  reports that she has never smoked. She has never used smokeless tobacco. Alcohol:  reports that she does not drink alcohol. Drug:  reports that she does not use illicit drugs.  Physical Exam  Vitals:  Today's Vitals   04/06/15 1123 04/06/15 1125  BP: 140/85   Pulse: 93   Temp: 98.6 F (37 C)   Resp: 16   Height: 5\' 5"  (1.651 m)   Weight: 270 lb (122.471 kg)   SpO2: 100%   PainSc: 0-No pain 0-No pain    Calculated BMI: Body mass index is 44.93 kg/(m^2). Extreme obesity (Class III) (>40 kg/m2) - 254% higher incidence of chronic pain  General appearance: alert, cooperative, appears stated age, no distress and morbidly obese Eyes: PERLA Respiratory: No evidence respiratory distress, no audible rales or ronchi and no use of accessory muscles of respiration  Lumbar Spine Inspection: No gross anomalies detected Alignment: Symetrical ROM: Decreased  Gait: Antalgic (limping). She still ambulating using her walker.  Lower Extremities Inspection: No gross anomalies detected ROM: Restricted Sensory:  Normal Motor: Grossly intact but with some evidence of deconditioning.  Assessment & Plan  Primary Diagnosis & Pertinent Problem List: The primary encounter diagnosis was Chronic hip pain (Left). A diagnosis of Opiate use (30 MME/Day) was also pertinent to this visit.  Visit Diagnosis: 1. Chronic hip pain (Left)   2. Opiate use (30 MME/Day)     Problem-specific Plan(s): No problem-specific assessment & plan notes found for this encounter.   Plan of Care  Pharmacotherapy (Medications Ordered): No orders of the defined types were placed in this encounter.    Lab-work & Procedure Ordered: No orders of the defined types were placed in this encounter.    Imaging  Ordered: None  Interventional Therapies: Scheduled: None at this time. PRN Procedures: Palliative left intra-articular hip injection under fluoroscopic guidance, without sedation, no sooner than 09/25/2015.    Referral(s) or Consult(s): None at this point.  Medications administered during this visit: Ms. Hira had no medications administered during this visit.  Future Appointments Date Time Provider Department Center  04/13/2015 4:00 PM Carlyon Shadow Tortorici, PT [facility] None  04/15/2015 1:00 PM Carlyon Shadow Tortorici, PT [facility] None  04/20/2015 1:00 PM Carlyon Shadow Tortorici, PT [facility] None  04/22/2015 1:00 PM Himabindu Daleen Bo, MD [facility] None  04/23/2015 1:45 PM Carlyon Shadow Tortorici, PT [facility] None  04/27/2015 1:00 PM Carlyon Shadow Tortorici, PT [facility] None  04/29/2015 11:20 AM Delano Metz, MD [facility] None  04/29/2015 1:00 PM Carlyon Shadow Tortorici, PT [facility] None  05/04/2015 1:00 PM Abelardo Diesel, PT [facility] None    Primary Care Physician: Leotis Shames, MD Location: [facility] Outpatient Pain Management Facility Note by: Sydnee Levans. Laban Emperor, M.D, DABA, DABAPM, DABPM, DABIPP, FIPP  Pain Score Disclaimer: We use the NRS-11 scale. This is a self-reported, subjective measurement of pain severity with only modest accuracy. It is used primarily to identify changes within a particular patient. It must be understood that outpatient pain scales are significantly less accurate that those used for research, where they can be applied under ideal controlled circumstances with minimal exposure to variables. In reality, the score is likely to be a combination of pain intensity and pain affect, where pain affect describes the degree of emotional arousal or changes in action readiness caused by the sensory experience of pain. Factors such as social and work situation, setting, emotional  state, anxiety levels, expectation, and prior pain experience may influence pain perception and show large  inter-individual differences that may also be affected by time variables.

## 2015-04-13 ENCOUNTER — Ambulatory Visit: Payer: Medicare Other | Attending: Internal Medicine

## 2015-04-13 DIAGNOSIS — R2681 Unsteadiness on feet: Secondary | ICD-10-CM | POA: Diagnosis present

## 2015-04-13 NOTE — Therapy (Signed)
[facility] MAIN [facility] SERVICES [address] [place], [place], [address] Phone: [phone]   Fax:  [phone]  Physical Therapy Evaluation  Patient Details  Name: Laura [name] MRN: [id number] Date of Birth: 01/19/1945 Referring Provider: Thedore Mins  Encounter Date: 04/13/2015      PT End of Session - 04/13/15 1655    Visit Number 1   Number of Visits 17   Date for PT Re-Evaluation 05/11/15   Authorization Type 1/10   PT Start Time 1600   PT Stop Time 1655   PT Time Calculation (min) 55 min   Equipment Utilized During Treatment Gait belt   Activity Tolerance Patient limited by fatigue;Patient limited by pain   Behavior During Therapy [facility] for tasks assessed/performed      Past Medical History  Diagnosis Date  . Anxiety   . Depression   . Chronic kidney disease   . Thyroid disease   . Allergy   . Sleep apnea   . Arthritis, degenerative 10/08/2013    Overview:    a.  Lumbar spine/spinal stenosis/foot drop.   b.  Hands.     Past Surgical History  Procedure Laterality Date  . Abdominal hysterectomy    . Cesarean section    . Replacement total knee Left   . Hip surgery Right     There were no vitals filed for this visit.  Visit Diagnosis:  Unsteadiness on feet - Plan: PT plan of care cert/re-cert      Subjective Assessment - 04/13/15 1612    Subjective Pt reports she has been loosing weight thorugh a low carb diet, she would like to exercises more but has issues with her balance and back pain. pt would like to be able to move more, safely and with less pain. pt has been having hip pain about months ago due to OA. she had a cortisone shot which helped significantly.   Patient is accompained by: Family member  Brett Canales husband   Pertinent History extensive stenosis, L knee TKA, hip OA   How long can you stand comfortably? 3 min   Patient Stated Goals Improve balance, be able to exercise with less pain    Currently in Pain? Yes    Pain Score 5    Pain Location Back   Pain Orientation Right   Pain Type Chronic pain            [facility] PT Assessment - 04/13/15 0001    Assessment   Medical Diagnosis imbalance    Referring Provider Thedore Mins   Precautions   Precautions None   Restrictions   Weight Bearing Restrictions No   Balance Screen   Has the patient fallen in the past 6 months Yes   How many times? 1   Has the patient had a decrease in activity level because of a fear of falling?  Yes   Is the patient reluctant to leave their home because of a fear of falling?  Yes   Standardized Balance Assessment   Standardized Balance Assessment Berg Balance Test;10 meter walk test;Five Times Sit to Stand   Five times sit to stand comments  22.0   10 Meter Walk 0.42m/s   Berg Balance Test   Sit to Stand Able to stand  independently using hands   Standing Unsupported Able to stand 30 seconds unsupported   Sitting with Back Unsupported but Feet Supported on Floor or Stool Able to sit safely and securely 2 minutes   Stand to  Sit Controls descent by using hands   Transfers Able to transfer safely, definite need of hands   Standing Unsupported with Eyes Closed Unable to keep eyes closed 3 seconds but stays steady   Standing Ubsupported with Feet Together Needs help to attain position and unable to hold for 15 seconds   From Standing, Reach Forward with Outstretched Arm Reaches forward but needs supervision   From Standing Position, Pick up Object from Floor Unable to try/needs assist to keep balance   From Standing Position, Turn to Look Behind Over each Shoulder Needs supervision when turning   Turn 360 Degrees Needs assistance while turning   Standing Unsupported, Alternately Place Feet on Step/Stool Needs assistance to keep from falling or unable to try   Standing Unsupported, One Foot in Front Needs help to step but can hold 15 seconds   Standing on One Leg Unable to try or needs assist to prevent fall   Total Score 19          POSTURE/OBSERVATION: Forward flexed waist  PROM/AROM: WFL  STRENGTH:  Graded on a 0-5 scale Muscle Group Left Right  Shoulder flex    Shoulder Abd    Shoulder Ext    Shoulder IR/ER    Elbow    Wrist/hand     Hip Flex 4 4-  Hip Abd 4 4  Hip Add 4- 4  Hip Ext 4- 4-  Hip IR/ER    Knee Flex 4 4  Knee Ext 4 4  Ankle DF 4 4  Ankle PF 3 3   SENSATION:  WNL  FUNCTIONAL MOBILITY: Needs UE support  BALANCE: Pt can stand 1 min without UE support . See berg  GAIT: Forward flexed on rollator. Heavy UE support. Short step length. Reduced heel/toe transfer                       PT Education - 04/13/15 1655    Education provided Yes   Education Details plan of care   Person(s) Educated Patient   Methods Explanation   Comprehension Verbalized understanding             PT Long Term Goals - 04/13/15 1700    PT LONG TERM GOAL #1   Title pt will be able to stand with light UE support x 5 min to assist with meal prep   Time 8   Period Weeks   Status New   PT LONG TERM GOAL #2   Title pt will perform 5 x sit to stand in <15s to demo improved LE strength    Time 8   Period Weeks   Status New   PT LONG TERM GOAL #3   Title pt will improve 26m walk to 0.27m/s to normalize home mobility    Time 8   Period Weeks   Status New   PT LONG TERM GOAL #4   Title pt will imrpove berg balance test by 6 pts to reduce fall risk    Time 8   Period Weeks   Status New               Plan - 04/13/15 1656    Clinical Impression Statement pt presents as 70 y/o F with hx of severe stenosis, OA, obesity, anxiety, history of falls, anxiety. pt had a recent weight loss and would like to exercise more, but is limited by her back/ RLE pain, standing/walking endurance, and poor balance. pt scored 19/56 on berg  balance test which is a severe deficity and high fall risk. pt would benefit from skilled PT services to improve core/ hip strength, initiate HEP,,  improve balance and activity tolerance.    Pt will benefit from skilled therapeutic intervention in order to improve on the following deficits Decreased strength;Pain;Decreased mobility;Difficulty walking;Decreased endurance;Decreased balance;Decreased activity tolerance   Rehab Potential Good   Clinical Impairments Affecting Rehab Potential co-morbidities    PT Frequency 2x / week   PT Duration 8 weeks   PT Treatment/Interventions ADLs/Self Care Home Management;Aquatic Therapy;Patient/family education;Neuromuscular re-education;Balance training;Therapeutic exercise;Therapeutic activities;Functional mobility training;Gait training;Manual techniques;Passive range of motion   PT Next Visit Plan HEP   Recommended Other Services aquatic therapy   Consulted and Agree with Plan of Care Patient          G-Codes - 21-Apr-2015 1701    Functional Assessment Tool Used berg/[id number]   Functional Limitation Mobility: Walking and moving around   Mobility: Walking and Moving Around Current Status ([id number]) At least 40 percent but less than 60 percent impaired, limited or restricted   Mobility: Walking and Moving Around Goal Status [phone]) At least 20 percent but less than 40 percent impaired, limited or restricted       Problem List Patient Active Problem List   Diagnosis Date Noted  . Chronic hip pain (Left) 04/06/2015  . History of total knee replacement (Left) 02/09/2015  . History of femur fracture (Right) 02/09/2015  . 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 Lumbar foraminal stenosis (Bilateral L3-4 and L5-S1) 02/09/2015  . Chronic low back pain (Location of Primary Source of Pain) (Bilateral) (L>R) 01/22/2015  . Chronic pain syndrome 11/25/2014  . Opioid dependence (HCC) 11/07/2014  . Lumbar spinal stenosis (5 mm Severe L3-4; 8 mm L4-5) 11/07/2014  . Lumbar spondylosis 11/07/2014  . Lumbar facet syndrome  (Location of Primary Source of Pain) (Bilateral) (L>R) 11/07/2014  . 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 Chronic pain 11/05/2014  . Chronic knee pain (Right) 11/05/2014  . Osteoarthritis of knee (Right) 11/05/2014  . Opiate use (30 MME/Day) 11/05/2014  . Long term current use of opiate analgesic 11/05/2014  . Long term prescription opiate use 11/05/2014  . Encounter for therapeutic drug level monitoring 11/05/2014  . Morbid obesity (HCC) 10/27/2014  . Diabetes mellitus, type 2 (HCC) 10/07/2014  . Extreme obesity (HCC) 10/07/2014  . Seizure (HCC) 10/07/2014  . Gastro-esophageal reflux disease without esophagitis 06/24/2014  . Memory loss, short term 03/19/2014  . Type 2 diabetes mellitus (HCC) 03/19/2014  . Acquired hypothyroidism 01/14/2014  . Clinical depression 10/14/2013  . Essential (primary) hypertension 10/14/2013  . Anxiety 07/09/2013  . Cannot sleep 07/09/2013  . Acid reflux 01/18/2013  . Adult hypothyroidism 01/18/2013  . HLD (hyperlipidemia) 01/18/2013  . Rheumatoid arthritis without elevated rheumatoid factor (HCC) 01/18/2013  . 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 Chronic kidney disease (CKD), stage III (moderate) 01/17/2013  . Congestive heart failure with left ventricular systolic dysfunction (HCC) 11/10/2012   Morrie Sheldon C. Tamim Skog, PT, DPT [phone]  Nevayah Faust April 21, 2015, 5:03 PM  [facility] MAIN [facility] SERVICES [address] [place], [place], [address] Phone: [phone]   Fax:  [phone]  Name: Arlis Everly MRN: [id number] Date of Birth: 12/01/1945

## 2015-04-15 ENCOUNTER — Ambulatory Visit: Payer: Medicare Other

## 2015-04-15 VITALS — BP 102/82 | HR 86

## 2015-04-15 DIAGNOSIS — R2681 Unsteadiness on feet: Secondary | ICD-10-CM | POA: Diagnosis not present

## 2015-04-15 NOTE — Therapy (Signed)
[facility] MAIN [facility] SERVICES [address] [place], [place], [address] Phone: [phone]   Fax:  [phone]  Physical Therapy Treatment  Patient Details  Name: Dietra Stokely MRN: [id number] Date of Birth: April 12, 1945 Referring Provider: Thedore Mins  Encounter Date: 04/15/2015      PT End of Session - 04/15/15 1324    Visit Number 2   Number of Visits 17   Date for PT Re-Evaluation 05/11/15   Authorization Type 2/10   PT Start Time 1315   PT Stop Time 1345   PT Time Calculation (min) 30 min   Equipment Utilized During Treatment Gait belt   Activity Tolerance Patient limited by fatigue;Patient limited by pain   Behavior During Therapy [facility] for tasks assessed/performed      Past Medical History  Diagnosis Date  . Anxiety   . Depression   . Chronic kidney disease   . Thyroid disease   . Allergy   . Sleep apnea   . Arthritis, degenerative 10/08/2013    Overview:    a.  Lumbar spine/spinal stenosis/foot drop.   b.  Hands.     Past Surgical History  Procedure Laterality Date  . Abdominal hysterectomy    . Cesarean section    . Replacement total knee Left   . Hip surgery Right     Filed Vitals:   04/15/15 1319  BP: 102/82  Pulse: 86  SpO2: 100%    Visit Diagnosis:  Unsteadiness on feet      Subjective Assessment - 04/15/15 1319    Subjective Pt reports she is doing well on this date. She reports some low back pain at this time which is chronic. No specific questions or concerns at this time.    Patient is accompained by: Family member  Brett Canales husband   Pertinent History extensive stenosis, L knee TKA, hip OA   How long can you stand comfortably? 3 min   Patient Stated Goals Improve balance, be able to exercise with less pain    Currently in Pain? Yes   Pain Score 4    Pain Location Back   Pain Orientation Right;Lower   Pain Type Chronic pain      Treatment  There-ex   NuStep L2 x 5 minutes for warm-up during  history; Sit to stand without UE support from chair with Airex pad on seat 2 x 5; Step-ups to 6" step alternating LE x 5 each side; Side stepping in // bars 6' x 4; Standing marches with 1 UE followed by no UE support; Multiple seated rest breaks; HEP furnished and provided to patient;                           PT Education - 04/15/15 1323    Education provided Yes   Education Details HEP furnished   [employer]) Educated Patient   Methods Explanation;Demonstration;Verbal cues;Handout   Comprehension Verbalized understanding;Returned demonstration;Verbal cues required;Tactile cues required             PT Long Term Goals - 04/13/15 1700    PT LONG TERM GOAL #1   Title pt will be able to stand with light UE support x 5 min to assist with meal prep   Time 8   Period Weeks   Status New   PT LONG TERM GOAL #2   Title pt will perform 5 x sit to stand in <15s to demo improved LE strength  Time 8   Period Weeks   Status New   PT LONG TERM GOAL #3   Title pt will improve 67m walk to 0.45m/s to normalize home mobility    Time 8   Period Weeks   Status New   PT LONG TERM GOAL #4   Title pt will imrpove berg balance test by 6 pts to reduce fall risk    Time 8   Period Weeks   Status New               Plan - 04/15/15 1325    Clinical Impression Statement Unfortunately pt arrived late for PT session requiring limited session due to limitations in schedule. Pt demonstrates high fear of falling and poor balance confidence. She denies increase in back pain with all exercises. Pt requires seated rest breaks throughout due to fatigue with exercises. Pt was furnished with HEP and provided extensive instructions regarding safety with performance at home. Pt encouraged to initiate exercises at home and follow-up as scheduled. Pt will benefit from skilled PT services to address deficits in strength and balance in order to improve function at home.    Pt will  benefit from skilled therapeutic intervention in order to improve on the following deficits Decreased strength;Pain;Decreased mobility;Difficulty walking;Decreased endurance;Decreased balance;Decreased activity tolerance   Rehab Potential Good   Clinical Impairments Affecting Rehab Potential co-morbidities    PT Frequency 2x / week   PT Duration 8 weeks   PT Treatment/Interventions ADLs/Self Care Home Management;Aquatic Therapy;Patient/family education;Neuromuscular re-education;Balance training;Therapeutic exercise;Therapeutic activities;Functional mobility training;Gait training;Manual techniques;Passive range of motion   PT Next Visit Plan Continue strengthening and balance, progress HEP as appropriate   PT Home Exercise Plan Seated LAQ with theraband, standing hip abduction, standing marches (to improve SLS balance), sit to stand without UE support   Consulted and Agree with Plan of Care Patient        Problem List Patient Active Problem List   Diagnosis Date Noted  . Chronic hip pain (Left) 04/06/2015  . History of total knee replacement (Left) 02/09/2015  . History of femur fracture (Right) 02/09/2015  . 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 Lumbar foraminal stenosis (Bilateral L3-4 and L5-S1) 02/09/2015  . Chronic low back pain (Location of Primary Source of Pain) (Bilateral) (L>R) 01/22/2015  . Chronic pain syndrome 11/25/2014  . Opioid dependence (HCC) 11/07/2014  . Lumbar spinal stenosis (5 mm Severe L3-4; 8 mm L4-5) 11/07/2014  . Lumbar spondylosis 11/07/2014  . Lumbar facet syndrome (Location of Primary Source of Pain) (Bilateral) (L>R) 11/07/2014  . 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 Chronic pain 11/05/2014  . Chronic knee pain (Right) 11/05/2014  . Osteoarthritis of knee (Right) 11/05/2014  . Opiate use (30 MME/Day) 11/05/2014  . Long term current use of opiate  analgesic 11/05/2014  . Long term prescription opiate use 11/05/2014  . Encounter for therapeutic drug level monitoring 11/05/2014  . Morbid obesity (HCC) 10/27/2014  . Diabetes mellitus, type 2 (HCC) 10/07/2014  . Extreme obesity (HCC) 10/07/2014  . Seizure (HCC) 10/07/2014  . Gastro-esophageal reflux disease without esophagitis 06/24/2014  . Memory loss, short term 03/19/2014  . Type 2 diabetes mellitus (HCC) 03/19/2014  . Acquired hypothyroidism 01/14/2014  . Clinical depression 10/14/2013  . Essential (primary) hypertension 10/14/2013  . Anxiety 07/09/2013  . Cannot sleep 07/09/2013  . Acid reflux 01/18/2013  . Adult hypothyroidism 01/18/2013  . HLD (hyperlipidemia) 01/18/2013  .  Rheumatoid arthritis without elevated rheumatoid factor (HCC) 01/18/2013  . 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 Chronic kidney disease (CKD), stage III (moderate) 01/17/2013  . Congestive heart failure with left ventricular systolic dysfunction (HCC) 11/10/2012   Lynnea Maizes PT, DPT   Calob Baskette 04/15/2015, 4:26 PM  [facility] MAIN [facility] SERVICES [address] [place], [place], [address] Phone: [phone]   Fax:  [phone]  Name: Raetta Huestis MRN: [id number] Date of Birth: [birth year]

## 2015-04-15 NOTE — Patient Instructions (Addendum)
ABDUCTION: Standing (Active)    Stand, feet flat. Lift right leg out to side. Complete _2__ sets of _10__ repetitions. Perform _2__ sessions per day.   Marching in Place: Varied Surfaces    March in place, slowly lifting knees toward ceiling. Repeat __5__ times per session for each leg. Do _2___ sessions per day. Keep hands near surface in order to hold on for safety;      SIT TO STAND: Feet Apart    Place feet shoulder width apart. Lean chest forward. Raise hips and straighten knees to stand. _5__ reps per set, _2__ sets per day, Keep support nearby or perform in corner of room for safety.  KNEE: Extension, Long Arc Quad (Band)    Place band around leg and under other foot. Pull band forward until knee is straight. Hold _2__ seconds. _5__ reps per set/leg, repeat; _2__ sets per day.

## 2015-04-20 ENCOUNTER — Ambulatory Visit: Payer: Medicare Other

## 2015-04-20 DIAGNOSIS — R2681 Unsteadiness on feet: Secondary | ICD-10-CM | POA: Diagnosis not present

## 2015-04-20 NOTE — Therapy (Signed)
[facility] MAIN [facility] SERVICES [address] [place], [place], [address] Phone: [phone]   Fax:  [phone]  Physical Therapy Treatment  Patient Details  Name: Ragan Duhon MRN: [id number] Date of Birth: May 23, 1945 Referring Provider: Thedore Mins  Encounter Date: 04/20/2015      PT End of Session - 04/20/15 1352    Visit Number 3   Number of Visits 17   Date for PT Re-Evaluation 05/11/15   Authorization Type 3/10   PT Start Time 1315   PT Stop Time 1345   PT Time Calculation (min) 30 min   Equipment Utilized During Treatment Gait belt   Activity Tolerance Patient limited by fatigue;Patient limited by pain   Behavior During Therapy [facility] for tasks assessed/performed      Past Medical History  Diagnosis Date  . Anxiety   . Depression   . Chronic kidney disease   . Thyroid disease   . Allergy   . Sleep apnea   . Arthritis, degenerative 10/08/2013    Overview:    a.  Lumbar spine/spinal stenosis/foot drop.   b.  Hands.     Past Surgical History  Procedure Laterality Date  . Abdominal hysterectomy    . Cesarean section    . Replacement total knee Left   . Hip surgery Right     There were no vitals filed for this visit.      Subjective Assessment - 04/20/15 1327    Subjective pt reports she was tired after last visit but it felt good. pt reports she didnt do her HEP due to grandchildrent staying with her.    Patient is accompained by: Family member  Brett Canales husband   Pertinent History extensive stenosis, L knee TKA, hip OA   How long can you stand comfortably? 3 min   Patient Stated Goals Improve balance, be able to exercise with less pain    Currently in Pain? Yes   Pain Score 6    Pain Location --  neck       Therex:   Toe taps 2x 20 each side with UE support Mini squat x 10  Hip abduction 2x10 each leg  Fwd step up onto 4inch step 2x5 each leg with double UE support Fwd/retro weight shift heel/toe with single UE  support x 20 each leg  Standing wihtout UEs 5x10s, then 1x15s then 1x20s Standing with knees "unlocked" hips fwd more upright 2x10s Pt needs cues to continue exercise with less rest breaks.                              PT Long Term Goals - 04/13/15 1700    PT LONG TERM GOAL #1   Title pt will be able to stand with light UE support x 5 min to assist with meal prep   Time 8   Period Weeks   Status New   PT LONG TERM GOAL #2   Title pt will perform 5 x sit to stand in <15s to demo improved LE strength    Time 8   Period Weeks   Status New   PT LONG TERM GOAL #3   Title pt will improve 98m walk to 0.27m/s to normalize home mobility    Time 8   Period Weeks   Status New   PT LONG TERM GOAL #4   Title pt will imrpove berg balance test by 6 pts to reduce fall  risk    Time 8   Period Weeks   Status New               Plan - 04/20/15 1353    Clinical Impression Statement pt was 15 min tardy to apt again today. pt did well with progression of activities and requests rest after nearly every exercise. PT encouraged pt to stand to rest to improve standing tolerance. pt was able to stand without UE support for short periods   Rehab Potential Good   Clinical Impairments Affecting Rehab Potential co-morbidities    PT Frequency 2x / week   PT Duration 8 weeks   PT Treatment/Interventions ADLs/Self Care Home Management;Aquatic Therapy;Patient/family education;Neuromuscular re-education;Balance training;Therapeutic exercise;Therapeutic activities;Functional mobility training;Gait training;Manual techniques;Passive range of motion   PT Next Visit Plan Continue strengthening and balance, progress HEP as appropriate   PT Home Exercise Plan Seated LAQ with theraband, standing hip abduction, standing marches (to improve SLS balance), sit to stand without UE support   Consulted and Agree with Plan of Care Patient      Patient will benefit from skilled therapeutic  intervention in order to improve the following deficits and impairments:  Decreased strength, Pain, Decreased mobility, Difficulty walking, Decreased endurance, Decreased balance, Decreased activity tolerance  Visit Diagnosis: Unsteadiness on feet     Problem List Patient Active Problem List   Diagnosis Date Noted  . Chronic hip pain (Left) 04/06/2015  . History of total knee replacement (Left) 02/09/2015  . History of femur fracture (Right) 02/09/2015  . 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 Lumbar foraminal stenosis (Bilateral L3-4 and L5-S1) 02/09/2015  . Chronic low back pain (Location of Primary Source of Pain) (Bilateral) (L>R) 01/22/2015  . Chronic pain syndrome 11/25/2014  . Opioid dependence (HCC) 11/07/2014  . Lumbar spinal stenosis (5 mm Severe L3-4; 8 mm L4-5) 11/07/2014  . Lumbar spondylosis 11/07/2014  . Lumbar facet syndrome (Location of Primary Source of Pain) (Bilateral) (L>R) 11/07/2014  . 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 Chronic pain 11/05/2014  . Chronic knee pain (Right) 11/05/2014  . Osteoarthritis of knee (Right) 11/05/2014  . Opiate use (30 MME/Day) 11/05/2014  . Long term current use of opiate analgesic 11/05/2014  . Long term prescription opiate use 11/05/2014  . Encounter for therapeutic drug level monitoring 11/05/2014  . Morbid obesity (HCC) 10/27/2014  . Diabetes mellitus, type 2 (HCC) 10/07/2014  . Extreme obesity (HCC) 10/07/2014  . Seizure (HCC) 10/07/2014  . Gastro-esophageal reflux disease without esophagitis 06/24/2014  . Memory loss, short term 03/19/2014  . Type 2 diabetes mellitus (HCC) 03/19/2014  . Acquired hypothyroidism 01/14/2014  . Clinical depression 10/14/2013  . Essential (primary) hypertension 10/14/2013  . Anxiety 07/09/2013  . Cannot sleep 07/09/2013  . Acid reflux 01/18/2013  . Adult hypothyroidism 01/18/2013  . HLD  (hyperlipidemia) 01/18/2013  . Rheumatoid arthritis without elevated rheumatoid factor (HCC) 01/18/2013  . 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 Chronic kidney disease (CKD), stage III (moderate) 01/17/2013  . Congestive heart failure with left ventricular systolic dysfunction (HCC) 11/10/2012   Morrie Sheldon C. Phoenicia Pirie, PT, DPT [id number]  Suliman Termini 04/20/2015, 1:54 PM  [facility] MAIN [facility] SERVICES [address] [place], [place], [address] Phone: [phone]   Fax:  [phone]  Name: Meenakshi Sazama MRN: [id number] Date of Birth: Sep 25, 1945

## 2015-04-22 ENCOUNTER — Encounter: Payer: Self-pay | Admitting: Psychiatry

## 2015-04-22 ENCOUNTER — Ambulatory Visit (INDEPENDENT_AMBULATORY_CARE_PROVIDER_SITE_OTHER): Payer: Medicare Other | Admitting: Psychiatry

## 2015-04-22 VITALS — BP 118/82 | HR 112 | Temp 97.0°F | Ht 65.0 in | Wt 279.8 lb

## 2015-04-22 DIAGNOSIS — G4700 Insomnia, unspecified: Secondary | ICD-10-CM

## 2015-04-22 DIAGNOSIS — F329 Major depressive disorder, single episode, unspecified: Secondary | ICD-10-CM | POA: Diagnosis not present

## 2015-04-22 DIAGNOSIS — F32A Depression, unspecified: Secondary | ICD-10-CM

## 2015-04-22 MED ORDER — DULOXETINE HCL 30 MG PO CPEP: ORAL_CAPSULE | ORAL | Status: DC

## 2015-04-22 MED ORDER — QUETIAPINE FUMARATE 50 MG PO TABS: 50.0000 mg | ORAL_TABLET | Freq: Two times a day (BID) | ORAL | Status: DC

## 2015-04-22 MED ORDER — QUETIAPINE FUMARATE 200 MG PO TABS: 200.0000 mg | ORAL_TABLET | Freq: Every day | ORAL | Status: DC

## 2015-04-22 NOTE — Progress Notes (Signed)
Patient ID: Laura [name], female   DOB: 1945-02-10, 70 y.o.   MRN: [id number] [facility] MD/PA/NP OP Progress Note  04/22/2015 1:21 PM Monica Codd  MRN:  [id number]  Subjective:  Patient is a 70 year old Caucasian female with a long history of depression and insomnia. Patient was previously seen by Dr. Mayford Knife and this is the first visit for this patient with this clinician. She reports doing well. States that they moved from [place] 4 years ago to be with her children. States she is sleeping well and eating well. She wanted to know if this clinician would prescribe Xanax when she gets anxious. She states that her daughter is 71 and is a physical therapist and is having trouble finding a partner. States that that worries her. Discussed with her that we could change the way she takes the Seroquel to help with her anxiety. Denies any depression. States that her husband hoards and 12 the bedrooms are taken up by his staff but overall they have a good marriage she reports. Patient is very gregarious and jovial. She states that she and her husband laugh a lot.   He shouldn't reports she recently had labs on March 9 and her cholesterol was 245 and all the other values were within normal limits. However on checking the the lab report did not see the new labs entered yet.   Visit Diagnosis:     ICD-9-CM ICD-10-CM   1. Insomnia 780.52 G47.00   2. Depression 311 F32.9     Past Medical History:  Past Medical History  Diagnosis Date  . Anxiety   . Depression   . Chronic kidney disease   . Thyroid disease   . Allergy   . Sleep apnea   . Arthritis, degenerative 10/08/2013    Overview:    a.  Lumbar spine/spinal stenosis/foot drop.   b.  Hands.     Past Surgical History  Procedure Laterality Date  . Abdominal hysterectomy    . Cesarean section    . Replacement total knee Left   . Hip surgery Right    Family History:  Family History  Problem Relation Age of Onset  . Hypertension Mother   .  Stroke Mother   . Heart attack Father   . Alcohol abuse Father   . Depression Father   . Post-traumatic stress disorder Father   . Rheum arthritis Sister   . Hypertension Sister   . Depression Sister    Social History:  Social History   Social History  . Marital Status: Married    Spouse Name: N/A  . Number of Children: N/A  . Years of Education: N/A   Social History Main Topics  . Smoking status: Never Smoker   . Smokeless tobacco: Never Used  . Alcohol Use: No  . Drug Use: No  . Sexual Activity: Not Currently   Other Topics Concern  . Not on file   Social History Narrative   Additional History:   Assessment:   Musculoskeletal: Strength & Muscle Tone: within normal limits Gait & Station: normal Patient leans: N/A  Psychiatric Specialty Exam: HPI  Review of Systems  Psychiatric/Behavioral: Negative for depression, suicidal ideas, hallucinations, memory loss and substance abuse. The patient is not nervous/anxious and does not have insomnia (Avis complaints have improved and she attributes it to  Seroquel).   All other systems reviewed and are negative.    There were no vitals taken for this visit.There is no weight on file to calculate BMI.  General Appearance: Well Groomed  Eye Contact:  Good  Speech:  Normal Rate  Volume:  Normal  Mood:  Good  Affect:  Congruent  Thought Process:  Linear and Logical  Orientation:  Full (Time, Place, and Person)  Thought Content:  Negative  Suicidal Thoughts:  No  Homicidal Thoughts:  No  Memory:  Immediate;   Good Recent;   Good Remote;   Good  Judgement:  Good  Insight:  Good  Psychomotor Activity:  Negative  Concentration:  Good  Recall:  Good  Fund of Knowledge: Good  Language: Good  Akathisia:  Negative  Handed:    AIMS (if indicated):  N/A  Assets:  Communication Skills Desire for Improvement Social Support  ADL's:  Intact  Cognition: WNL  Sleep:  fair   Is the patient at risk to self?  No. Has the  patient been a risk to self in the past 6 months?  No. Has the patient been a risk to self within the distant past?  No. Is the patient a risk to others?  No. Has the patient been a risk to others in the past 6 months?  No. Has the patient been a risk to others within the distant past?  No.  Current Medications: Current Outpatient Prescriptions  Medication Sig Dispense Refill  . amLODipine (NORVASC) 10 MG tablet Take 5 mg by mouth 2 (two) times daily.   11  . aspirin EC 81 MG tablet Take by mouth.    . Cholecalciferol (VITAMIN D) 2000 UNITS tablet Take by mouth daily.     . DULoxetine (CYMBALTA) 30 MG capsule Two tablets in the morning, and one in the evening. 270 capsule 0  . esomeprazole (NEXIUM) 20 MG capsule Take by mouth daily.     . furosemide (LASIX) 40 MG tablet TAKE 1 TABLET ONCE DAILY    . hydroxychloroquine (PLAQUENIL) 200 MG tablet TAKE 1 TABLET BY MOUTH TWICE A  DAY AS DIRECTED    . levothyroxine (SYNTHROID, LEVOTHROID) 200 MCG tablet TAKE 1 TABLET ONCE DAILY- ON AN EMPTY STOMACH WITH A GLASS OF WATER 30-60 MINUTES BEFORE BREAKFAST  5  . levothyroxine (SYNTHROID, LEVOTHROID) 25 MCG tablet TAKE 1 TABLET BY MOUTH EVERY DAY (TAKE WITH 20O MCG TABLET TO EQUAL A 225 MCG DOSE)  5  . loratadine (CLARITIN) 10 MG tablet Take by mouth daily as needed.     . metoprolol succinate (TOPROL-XL) 50 MG 24 hr tablet TAKE 1 TABLET (50 MG TOTAL) BY MOUTH ONCE DAILY.  1  . oxyCODONE (OXY IR/ROXICODONE) 5 MG immediate release tablet Take 1 tablet (5 mg total) by mouth every 6 (six) hours as needed for moderate pain or severe pain. 120 tablet 0  . QUEtiapine (SEROQUEL) 200 MG tablet Take 1 tablet (200 mg total) by mouth at bedtime. 90 tablet 0  . QUEtiapine (SEROQUEL) 50 MG tablet Take 1 tablet (50 mg total) by mouth 2 (two) times daily. 180 tablet 1  . spironolactone (ALDACTONE) 25 MG tablet TAKE 1/2 (ONE-HALF) TABLET BY MOUTH DAILY  11  . traMADol (ULTRAM) 50 MG tablet TAKE 1 TABLET BY MOUTH TWICE A  DAY AS NEEDED (OK TO FILL EARLY PER MD)  0   No current facility-administered medications for this visit.    Medical Decision Making:  Established Problem, Stable/Improving (1), Review of Medication Regimen & Side Effects (2) and Review of New Medication or Change in Dosage (2)  Treatment Plan Summary:Medication management and Plan    Major depressive  disorder, recurrent, moderate-patient will continue her Cymbalta 60 mg in the morning and 30 mg in the evening.   Insomnia- Decrease Seroquel to 200 mg at bedtime.   Labs -will request lab since patient reports she had them done in March  Anxiety- take Seroquel at 50 mg twice daily. If she gets too sedated in the morning to shift change that to bedtime.  RTC to clinic in 3 months time or call before if necessary.  Nazareth Kirk 04/22/2015, 1:21 PM

## 2015-04-23 ENCOUNTER — Ambulatory Visit: Payer: Medicare Other | Admitting: Physical Therapy

## 2015-04-23 DIAGNOSIS — R2681 Unsteadiness on feet: Secondary | ICD-10-CM

## 2015-04-23 NOTE — Therapy (Signed)
[facility] MAIN [facility] SERVICES [address] [place], [place], [address] Phone: [phone]   Fax:  [phone]  Physical Therapy Treatment  Patient Details  Name: Laura [name] MRN: [id number] Date of Birth: 21-Dec-1945 Referring Provider: Thedore Mins  Encounter Date: 04/23/2015      PT End of Session - 04/23/15 1107    Visit Number 4   Number of Visits 17   Date for PT Re-Evaluation 05/11/15   Authorization Type 4/10   PT Start Time 1030   PT Stop Time 1115   PT Time Calculation (min) 45 min   Equipment Utilized During Treatment Gait belt   Activity Tolerance Patient limited by fatigue;Patient limited by pain   Behavior During Therapy [facility] for tasks assessed/performed      Past Medical History  Diagnosis Date  . Anxiety   . Depression   . Chronic kidney disease   . Thyroid disease   . Allergy   . Sleep apnea   . Arthritis, degenerative 10/08/2013    Overview:    a.  Lumbar spine/spinal stenosis/foot drop.   b.  Hands.     Past Surgical History  Procedure Laterality Date  . Abdominal hysterectomy    . Cesarean section    . Replacement total knee Left   . Hip surgery Right     There were no vitals filed for this visit.      Subjective Assessment - 04/23/15 1106    Subjective Pt reported she felt scared about the pool but she still wants to do aquatic therapy.    Patient is accompained by: Family member  Brett Canales husband   Pertinent History extensive stenosis, L knee TKA, hip OA   How long can you stand comfortably? 3 min   Patient Stated Goals Improve balance, be able to exercise with less pain                      Adult Aquatic Therapy - 04/23/15 1106    Aquatic Therapy Subjective   Subjective Pt had not complaints nor contraindications to aquatic Tx     O: pt entered / exited pool via ramp with bilateral UE and sidestepping technique with SBA.   Exercises performed in 3'6" depth  Pt used UE assist on  noodles and rail throughout session to decrease fear of falling.  50 ft= 1 lap  Bilateral UE on rails: 10 reps of SLS/ hip flexion, B each set 10 reps hip ext, B each set   4 laps walking with forearms on  3 noodles, cued for visual marker of feet at hip width apart . PT assisted keeping noodles lower at waist to allow for decreased upper trap overuse and promote more upright posture Seated rest break 2' 4 laps walking with one noodle in each hand to facilitate trunk/arm swings. . PT assisted keeping noodles lower at waist to allow for decreased upper trap overuse and promote more upright posture Pivot turns x 8 with cues for wide feet placement ( with each lap) Seated rest break 5'     Education on fall risks                       PT Long Term Goals - 04/13/15 1700    PT LONG TERM GOAL #1   Title pt will be able to stand with light UE support x 5 min to assist with meal prep   Time 8  Period Weeks   Status New   PT LONG TERM GOAL #2   Title pt will perform 5 x sit to stand in <15s to demo improved LE strength    Time 8   Period Weeks   Status New   PT LONG TERM GOAL #3   Title pt will improve 49m walk to 0.17m/s to normalize home mobility    Time 8   Period Weeks   Status New   PT LONG TERM GOAL #4   Title pt will imrpove berg balance test by 6 pts to reduce fall risk    Time 8   Period Weeks   Status New               Plan - 04/23/15 1107    Clinical Impression Statement Pt had no pain complaints post session. Pt was educated and provided reassurance about her fear of falling which is associated with her fear of the water because she is unable to swim. PT educated pt on slowing down her movements and transfers and placing feet wider hip with in order to decrease risk of falls.  PT provided STB/ Supervision at close proximity to pt throughout session. Pt demo'd more upright posture in 3'6" depth of water and tolerated walking with noodles. Pt demo'd  difficulty sit to stand out of chair on land and required cuing for proper alignment. Pt relied on husband for donning of shoes and PT recommended pt buy water shoes for IND and safety.  Pt will continue to benefit from skilled PT.    Rehab Potential Good   Clinical Impairments Affecting Rehab Potential co-morbidities    PT Frequency 2x / week   PT Duration 8 weeks   PT Treatment/Interventions ADLs/Self Care Home Management;Aquatic Therapy;Patient/family education;Neuromuscular re-education;Balance training;Therapeutic exercise;Therapeutic activities;Functional mobility training;Gait training;Manual techniques;Passive range of motion   PT Next Visit Plan Continue strengthening and balance, progress HEP as appropriate   PT Home Exercise Plan Seated LAQ with theraband, standing hip abduction, standing marches (to improve SLS balance), sit to stand without UE support   Consulted and Agree with Plan of Care Patient      Patient will benefit from skilled therapeutic intervention in order to improve the following deficits and impairments:  Decreased strength, Pain, Decreased mobility, Difficulty walking, Decreased endurance, Decreased balance, Decreased activity tolerance  Visit Diagnosis: Unsteadiness on feet     Problem List Patient Active Problem List   Diagnosis Date Noted  . Chronic hip pain (Left) 04/06/2015  . History of total knee replacement (Left) 02/09/2015  . History of femur fracture (Right) 02/09/2015  . 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 Lumbar foraminal stenosis (Bilateral L3-4 and L5-S1) 02/09/2015  . Chronic low back pain (Location of Primary Source of Pain) (Bilateral) (L>R) 01/22/2015  . Chronic pain syndrome 11/25/2014  . Opioid dependence (HCC) 11/07/2014  . Lumbar spinal stenosis (5 mm Severe L3-4; 8 mm L4-5) 11/07/2014  . Lumbar spondylosis 11/07/2014  . Lumbar facet syndrome (Location of  Primary Source of Pain) (Bilateral) (L>R) 11/07/2014  . 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 Chronic pain 11/05/2014  . Chronic knee pain (Right) 11/05/2014  . Osteoarthritis of knee (Right) 11/05/2014  . Opiate use (30 MME/Day) 11/05/2014  . Long term current use of opiate analgesic 11/05/2014  . Long term prescription opiate use 11/05/2014  . Encounter for therapeutic drug level monitoring 11/05/2014  . Morbid obesity (HCC)  10/27/2014  . Diabetes mellitus, type 2 (HCC) 10/07/2014  . Extreme obesity (HCC) 10/07/2014  . Seizure (HCC) 10/07/2014  . Gastro-esophageal reflux disease without esophagitis 06/24/2014  . Memory loss, short term 03/19/2014  . Type 2 diabetes mellitus (HCC) 03/19/2014  . Acquired hypothyroidism 01/14/2014  . Clinical depression 10/14/2013  . Essential (primary) hypertension 10/14/2013  . Anxiety 07/09/2013  . Cannot sleep 07/09/2013  . Acid reflux 01/18/2013  . Adult hypothyroidism 01/18/2013  . HLD (hyperlipidemia) 01/18/2013  . Rheumatoid arthritis without elevated rheumatoid factor (HCC) 01/18/2013  . 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 Chronic kidney disease (CKD), stage III (moderate) 01/17/2013  . Congestive heart failure with left ventricular systolic dysfunction (HCC) 11/10/2012    Mariane Masters ,PT, DPT, E-RYT  04/23/2015, 11:29 AM  [facility] MAIN [facility] SERVICES [address] [place], [place], [address] Phone: [phone]   Fax:  [phone]  Name: Laura [name] MRN: [id number] Date of Birth: [birth year]

## 2015-04-27 ENCOUNTER — Ambulatory Visit: Payer: Medicare Other

## 2015-04-27 ENCOUNTER — Encounter: Payer: PRIVATE HEALTH INSURANCE | Admitting: Pain Medicine

## 2015-04-27 DIAGNOSIS — R2681 Unsteadiness on feet: Secondary | ICD-10-CM

## 2015-04-27 NOTE — Therapy (Signed)
[facility] MAIN [facility] SERVICES [address] [place], [place], [address] Phone: [phone]   Fax:  [phone]  Physical Therapy Treatment  Patient Details  Name: Laura [name] MRN: [id number] Date of Birth: 1945/10/20 Referring Provider: Thedore Mins  Encounter Date: 04/27/2015      PT End of Session - 04/27/15 1345    Visit Number 5   Number of Visits 17   Date for PT Re-Evaluation 05/11/15   Authorization Type 5/10   PT Start Time 1307   PT Stop Time 1345   PT Time Calculation (min) 38 min   Equipment Utilized During Treatment Gait belt   Activity Tolerance Patient limited by fatigue;Patient limited by pain   Behavior During Therapy [facility] for tasks assessed/performed      Past Medical History  Diagnosis Date  . Anxiety   . Depression   . Chronic kidney disease   . Thyroid disease   . Allergy   . Sleep apnea   . Arthritis, degenerative 10/08/2013    Overview:    a.  Lumbar spine/spinal stenosis/foot drop.   b.  Hands.     Past Surgical History  Procedure Laterality Date  . Abdominal hysterectomy    . Cesarean section    . Replacement total knee Left   . Hip surgery Right     There were no vitals filed for this visit.      Subjective Assessment - 04/27/15 1316    Subjective pt reports she really enjoyed aquatic therapy    Patient is accompained by: Family member  Brett Canales husband   Pertinent History extensive stenosis, L knee TKA, hip OA   How long can you stand comfortably? 3 min   Patient Stated Goals Improve balance, be able to exercise with less pain    Currently in Pain? Yes   Pain Score 5    Pain Location Back       therex   BP 128/70mmHg  Squat to heel raise 2x10 Side stepping in // bars x 3 laps  Hip circles in standing x 10 CW/CCW each leg Fwd step up on 6in step 2x10 each leg  Standing balance in staggered stance with 2 lb ball press out 5x10 nustep x 5 min L 4 no charge    Pt requires min verbal and  tactile cues for proper exercise performance    pt requires CGA for safety on balance exercises                             PT Long Term Goals - 04/13/15 1700    PT LONG TERM GOAL #1   Title pt will be able to stand with light UE support x 5 min to assist with meal prep   Time 8   Period Weeks   Status New   PT LONG TERM GOAL #2   Title pt will perform 5 x sit to stand in <15s to demo improved LE strength    Time 8   Period Weeks   Status New   PT LONG TERM GOAL #3   Title pt will improve 58m walk to 0.21m/s to normalize home mobility    Time 8   Period Weeks   Status New   PT LONG TERM GOAL #4   Title pt will imrpove berg balance test by 6 pts to reduce fall risk    Time 8   Period Weeks   Status  New               Plan - 04/27/15 1347    Clinical Impression Statement pt did well with progression of activity in session today. she was fatigued by the end of session. pt needs encouragement to take less rest breaks than she requests    Rehab Potential Good   Clinical Impairments Affecting Rehab Potential co-morbidities    PT Frequency 2x / week   PT Duration 8 weeks   PT Treatment/Interventions ADLs/Self Care Home Management;Aquatic Therapy;Patient/family education;Neuromuscular re-education;Balance training;Therapeutic exercise;Therapeutic activities;Functional mobility training;Gait training;Manual techniques;Passive range of motion   PT Next Visit Plan Continue strengthening and balance, progress HEP as appropriate   PT Home Exercise Plan Seated LAQ with theraband, standing hip abduction, standing marches (to improve SLS balance), sit to stand without UE support   Consulted and Agree with Plan of Care Patient      Patient will benefit from skilled therapeutic intervention in order to improve the following deficits and impairments:  Decreased strength, Pain, Decreased mobility, Difficulty walking, Decreased endurance, Decreased balance, Decreased  activity tolerance  Visit Diagnosis: Unsteadiness on feet     Problem List Patient Active Problem List   Diagnosis Date Noted  . Chronic hip pain (Left) 04/06/2015  . History of total knee replacement (Left) 02/09/2015  . History of femur fracture (Right) 02/09/2015  . 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 Lumbar foraminal stenosis (Bilateral L3-4 and L5-S1) 02/09/2015  . Chronic low back pain (Location of Primary Source of Pain) (Bilateral) (L>R) 01/22/2015  . Chronic pain syndrome 11/25/2014  . Opioid dependence (HCC) 11/07/2014  . Lumbar spinal stenosis (5 mm Severe L3-4; 8 mm L4-5) 11/07/2014  . Lumbar spondylosis 11/07/2014  . Lumbar facet syndrome (Location of Primary Source of Pain) (Bilateral) (L>R) 11/07/2014  . 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 Chronic pain 11/05/2014  . Chronic knee pain (Right) 11/05/2014  . Osteoarthritis of knee (Right) 11/05/2014  . Opiate use (30 MME/Day) 11/05/2014  . Long term current use of opiate analgesic 11/05/2014  . Long term prescription opiate use 11/05/2014  . Encounter for therapeutic drug level monitoring 11/05/2014  . Morbid obesity (HCC) 10/27/2014  . Diabetes mellitus, type 2 (HCC) 10/07/2014  . Extreme obesity (HCC) 10/07/2014  . Seizure (HCC) 10/07/2014  . Gastro-esophageal reflux disease without esophagitis 06/24/2014  . Memory loss, short term 03/19/2014  . Type 2 diabetes mellitus (HCC) 03/19/2014  . Acquired hypothyroidism 01/14/2014  . Clinical depression 10/14/2013  . Essential (primary) hypertension 10/14/2013  . Anxiety 07/09/2013  . Cannot sleep 07/09/2013  . Acid reflux 01/18/2013  . Adult hypothyroidism 01/18/2013  . HLD (hyperlipidemia) 01/18/2013  . Rheumatoid arthritis without elevated rheumatoid factor (HCC) 01/18/2013  . 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 Chronic kidney disease  (CKD), stage III (moderate) 01/17/2013  . Congestive heart failure with left ventricular systolic dysfunction (HCC) 11/10/2012   Morrie Sheldon C. Demarea Lorey, PT, DPT [phone]  Favor Kreh 04/27/2015, 1:51 PM  [facility] MAIN [facility] SERVICES [address] [place], [place], [address] Phone: [phone]   Fax:  [phone]  Name: Tyaisha Cullom MRN: [id number] Date of Birth: 21-Nov-1945

## 2015-04-28 ENCOUNTER — Ambulatory Visit: Payer: Medicare Other | Admitting: Physical Therapy

## 2015-04-28 DIAGNOSIS — R2681 Unsteadiness on feet: Secondary | ICD-10-CM | POA: Diagnosis not present

## 2015-04-29 ENCOUNTER — Ambulatory Visit: Payer: Medicare Other | Admitting: Psychiatry

## 2015-04-29 ENCOUNTER — Encounter: Payer: Medicare Other | Admitting: Pain Medicine

## 2015-04-29 NOTE — Therapy (Signed)
[facility] MAIN [facility] SERVICES [address] [place], [place], [address] Phone: [phone]   Fax:  [phone]  Physical Therapy Treatment  Patient Details  Name: Laura [name] MRN: [id number] Date of Birth: August 12, 1945 Referring Provider: Thedore Mins  Encounter Date: 04/28/2015      PT End of Session - 04/29/15 2011    Visit Number 6   Number of Visits 17   Date for PT Re-Evaluation 05/11/15   Authorization Type 6/10   PT Start Time 1030   PT Stop Time 1115   PT Time Calculation (min) 45 min   Equipment Utilized During Treatment Gait belt   Activity Tolerance Patient limited by fatigue;Patient limited by pain   Behavior During Therapy [facility] for tasks assessed/performed      Past Medical History  Diagnosis Date  . Anxiety   . Depression   . Chronic kidney disease   . Thyroid disease   . Allergy   . Sleep apnea   . Arthritis, degenerative 10/08/2013    Overview:    a.  Lumbar spine/spinal stenosis/foot drop.   b.  Hands.     Past Surgical History  Procedure Laterality Date  . Abdominal hysterectomy    . Cesarean section    . Replacement total knee Left   . Hip surgery Right     There were no vitals filed for this visit.      Subjective Assessment - 04/29/15 2010    Subjective Pt reported she was able to sleep throughout the night without sleeping pills after last aquatic session.   Patient is accompained by: Family member  Brett Canales husband   Pertinent History extensive stenosis, L knee TKA, hip OA   How long can you stand comfortably? 3 min   Patient Stated Goals Improve balance, be able to exercise with less pain                      Adult Aquatic Therapy - 04/29/15 2011    Aquatic Therapy Subjective   Subjective Pt had no complaints during aquatic session        O: pt entered/exited pool via ramp with B UE on rail using sidestepping pattern  Exercises were performed in 3'6" depth 50 ft = 1  lap Standing hip flexion 10 x each leg w/ BUEsupport on wall 2 laps walking with noodle in front at waist 2 laps walking with noodles in each hand to simulate parallel bars with PT pressing noodle at the level of her waist to minimize shoulder elevation Seated rest, gentle kicks, hip abd/ER, deep breathing 3' 1 lap walking w/  unilateral support on one noodle   Seated rest with neck and arms relaxed on noodles 5'                       PT Long Term Goals - 04/13/15 1700    PT LONG TERM GOAL #1   Title pt will be able to stand with light UE support x 5 min to assist with meal prep   Time 8   Period Weeks   Status New   PT LONG TERM GOAL #2   Title pt will perform 5 x sit to stand in <15s to demo improved LE strength    Time 8   Period Weeks   Status New   PT LONG TERM GOAL #3   Title pt will improve 29m walk to 0.5m/s to normalize home mobility  Time 8   Period Weeks   Status New   PT LONG TERM GOAL #4   Title pt will imrpove berg balance test by 6 pts to reduce fall risk    Time 8   Period Weeks   Status New               Plan - 04/29/15 2014    Clinical Impression Statement Pt demo'd ability to walk without UE support for a few steps in the pool with more confidence/ less fear and more stability. Pt demo'd good carry over with 180 turns  and sit to stand body mechanics. Pt progressed to increased walking distances with bilateral UE support in a more upright posture and showed less cuing for feet propioception for wider BOS for stability. Pt continued to require cuing to slow down in her stand > sit  t/f to promote better eccentric control  and decrease risk for falls. Pt voiced understanding. Pt continues to benefit from skilled PT.    Rehab Potential Good   Clinical Impairments Affecting Rehab Potential co-morbidities    PT Frequency 2x / week   PT Duration 8 weeks   PT Treatment/Interventions ADLs/Self Care Home Management;Aquatic  Therapy;Patient/family education;Neuromuscular re-education;Balance training;Therapeutic exercise;Therapeutic activities;Functional mobility training;Gait training;Manual techniques;Passive range of motion   PT Next Visit Plan Continue strengthening and balance, progress HEP as appropriate   PT Home Exercise Plan Seated LAQ with theraband, standing hip abduction, standing marches (to improve SLS balance), sit to stand without UE support   Consulted and Agree with Plan of Care Patient      Patient will benefit from skilled therapeutic intervention in order to improve the following deficits and impairments:  Decreased strength, Pain, Decreased mobility, Difficulty walking, Decreased endurance, Decreased balance, Decreased activity tolerance  Visit Diagnosis: Unsteadiness on feet     Problem List Patient Active Problem List   Diagnosis Date Noted  . Chronic hip pain (Left) 04/06/2015  . History of total knee replacement (Left) 02/09/2015  . History of femur fracture (Right) 02/09/2015  . 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 Lumbar foraminal stenosis (Bilateral L3-4 and L5-S1) 02/09/2015  . Chronic low back pain (Location of Primary Source of Pain) (Bilateral) (L>R) 01/22/2015  . Chronic pain syndrome 11/25/2014  . Opioid dependence (HCC) 11/07/2014  . Lumbar spinal stenosis (5 mm Severe L3-4; 8 mm L4-5) 11/07/2014  . Lumbar spondylosis 11/07/2014  . Lumbar facet syndrome (Location of Primary Source of Pain) (Bilateral) (L>R) 11/07/2014  . 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 Chronic pain 11/05/2014  . Chronic knee pain (Right) 11/05/2014  . Osteoarthritis of knee (Right) 11/05/2014  . Opiate use (30 MME/Day) 11/05/2014  . Long term current use of opiate analgesic 11/05/2014  . Long term prescription opiate use 11/05/2014  . Encounter for therapeutic drug level monitoring  11/05/2014  . Morbid obesity (HCC) 10/27/2014  . Diabetes mellitus, type 2 (HCC) 10/07/2014  . Extreme obesity (HCC) 10/07/2014  . Seizure (HCC) 10/07/2014  . Gastro-esophageal reflux disease without esophagitis 06/24/2014  . Memory loss, short term 03/19/2014  . Type 2 diabetes mellitus (HCC) 03/19/2014  . Acquired hypothyroidism 01/14/2014  . Clinical depression 10/14/2013  . Essential (primary) hypertension 10/14/2013  . Anxiety 07/09/2013  . Cannot sleep 07/09/2013  . Acid reflux 01/18/2013  . Adult hypothyroidism 01/18/2013  . HLD (hyperlipidemia) 01/18/2013  . Rheumatoid arthritis without elevated rheumatoid factor (HCC) 01/18/2013  .  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 Chronic kidney disease (CKD), stage III (moderate) 01/17/2013  . Congestive heart failure with left ventricular systolic dysfunction (HCC) 11/10/2012    Mariane Masters ,PT, DPT, E-RYT  04/29/2015, 8:21 PM  [place] [facility] MAIN [facility] SERVICES [address] [place], [place], [address] Phone: [phone]   Fax:  [phone]  Name: Laura [name] MRN: [id number] Date of Birth: 1945-10-21

## 2015-05-01 ENCOUNTER — Encounter: Payer: Self-pay | Admitting: Pain Medicine

## 2015-05-01 ENCOUNTER — Ambulatory Visit: Payer: Medicare Other | Attending: Pain Medicine | Admitting: Pain Medicine

## 2015-05-01 VITALS — BP 160/85 | HR 86 | Temp 98.2°F | Resp 16 | Ht 64.0 in | Wt 275.0 lb

## 2015-05-01 DIAGNOSIS — M545 Low back pain, unspecified: Secondary | ICD-10-CM

## 2015-05-01 DIAGNOSIS — R413 Other amnesia: Secondary | ICD-10-CM | POA: Insufficient documentation

## 2015-05-01 DIAGNOSIS — M4806 Spinal stenosis, lumbar region: Secondary | ICD-10-CM | POA: Diagnosis not present

## 2015-05-01 DIAGNOSIS — M170 Bilateral primary osteoarthritis of knee: Secondary | ICD-10-CM | POA: Diagnosis not present

## 2015-05-01 DIAGNOSIS — E039 Hypothyroidism, unspecified: Secondary | ICD-10-CM | POA: Diagnosis not present

## 2015-05-01 DIAGNOSIS — I509 Heart failure, unspecified: Secondary | ICD-10-CM | POA: Insufficient documentation

## 2015-05-01 DIAGNOSIS — Z79891 Long term (current) use of opiate analgesic: Secondary | ICD-10-CM | POA: Insufficient documentation

## 2015-05-01 DIAGNOSIS — K219 Gastro-esophageal reflux disease without esophagitis: Secondary | ICD-10-CM | POA: Insufficient documentation

## 2015-05-01 DIAGNOSIS — E785 Hyperlipidemia, unspecified: Secondary | ICD-10-CM | POA: Diagnosis not present

## 2015-05-01 DIAGNOSIS — M160 Bilateral primary osteoarthritis of hip: Secondary | ICD-10-CM | POA: Diagnosis not present

## 2015-05-01 DIAGNOSIS — Z90710 Acquired absence of both cervix and uterus: Secondary | ICD-10-CM | POA: Diagnosis not present

## 2015-05-01 DIAGNOSIS — G4730 Sleep apnea, unspecified: Secondary | ICD-10-CM | POA: Diagnosis not present

## 2015-05-01 DIAGNOSIS — F419 Anxiety disorder, unspecified: Secondary | ICD-10-CM | POA: Diagnosis not present

## 2015-05-01 DIAGNOSIS — M25561 Pain in right knee: Secondary | ICD-10-CM | POA: Insufficient documentation

## 2015-05-01 DIAGNOSIS — G8929 Other chronic pain: Secondary | ICD-10-CM | POA: Diagnosis not present

## 2015-05-01 DIAGNOSIS — M25569 Pain in unspecified knee: Secondary | ICD-10-CM | POA: Diagnosis present

## 2015-05-01 DIAGNOSIS — Z5181 Encounter for therapeutic drug level monitoring: Secondary | ICD-10-CM | POA: Diagnosis not present

## 2015-05-01 DIAGNOSIS — M069 Rheumatoid arthritis, unspecified: Secondary | ICD-10-CM | POA: Diagnosis not present

## 2015-05-01 DIAGNOSIS — R569 Unspecified convulsions: Secondary | ICD-10-CM | POA: Diagnosis not present

## 2015-05-01 DIAGNOSIS — F1190 Opioid use, unspecified, uncomplicated: Secondary | ICD-10-CM

## 2015-05-01 DIAGNOSIS — F329 Major depressive disorder, single episode, unspecified: Secondary | ICD-10-CM | POA: Insufficient documentation

## 2015-05-01 DIAGNOSIS — Z96652 Presence of left artificial knee joint: Secondary | ICD-10-CM | POA: Insufficient documentation

## 2015-05-01 DIAGNOSIS — I129 Hypertensive chronic kidney disease with stage 1 through stage 4 chronic kidney disease, or unspecified chronic kidney disease: Secondary | ICD-10-CM | POA: Diagnosis not present

## 2015-05-01 DIAGNOSIS — E119 Type 2 diabetes mellitus without complications: Secondary | ICD-10-CM | POA: Insufficient documentation

## 2015-05-01 DIAGNOSIS — M25559 Pain in unspecified hip: Secondary | ICD-10-CM | POA: Diagnosis present

## 2015-05-01 DIAGNOSIS — M549 Dorsalgia, unspecified: Secondary | ICD-10-CM | POA: Diagnosis present

## 2015-05-01 MED ORDER — OXYCODONE HCL 5 MG PO TABS: 5.0000 mg | ORAL_TABLET | Freq: Four times a day (QID) | ORAL | Status: DC | PRN

## 2015-05-01 NOTE — Progress Notes (Signed)
Safety precautions to be maintained throughout the outpatient stay will include: orient to surroundings, keep bed in low position, maintain call bell within reach at all times, provide assistance with transfer out of bed and ambulation. Patient did not bring her meds to the office today.

## 2015-05-01 NOTE — Progress Notes (Signed)
[name]  Patient type: Established  MRN: [id number]  Service setting: Ambulatory outpatient  DOB: 15-Aug-1945  Location: [facility]  DOS: 05/01/2015  Primary Care Physician: Leotis Shames, MD  Note by: Sydnee Levans. Laban Emperor, M.D, DABA, DABAPM, DABPM, DABIPP, FIPP  Referring Physician: Leotis Shames, MD  Specialty: Board-Certified Interventional Pain Management     Primary Reason(s) for Visit: Encounter for prescription drug management (Level of risk: moderate) CC: Back Pain; Hip Pain; and Knee Pain   HPI  Laura [name] is a 70 y.o. year old, female patient, who returns today as an established patient. She has Acquired hypothyroidism; Anxiety; Chronic kidney disease (CKD), stage III (moderate); Clinical depression; Diabetes mellitus, type 2 (HCC); Essential (primary) hypertension; Acid reflux; Adult hypothyroidism; HLD (hyperlipidemia); Gastro-esophageal reflux disease without esophagitis; Extreme obesity (HCC); Cannot sleep; Rheumatoid arthritis without elevated rheumatoid factor (HCC); Seizure (HCC); Memory loss, short term; Congestive heart failure with left ventricular systolic dysfunction (HCC); Type 2 diabetes mellitus (HCC); Morbid obesity (HCC); Seronegative rheumatoid arthritis (HCC); Chronic pain; Chronic knee pain (Right); Osteoarthritis of knee (Right); Opiate use (30 MME/Day); Long term current use of opiate analgesic; Long term prescription opiate use; Encounter for therapeutic drug level monitoring; Opioid dependence (HCC); Lumbar spinal stenosis (5 mm Severe L3-4; 8 mm L4-5); Lumbar spondylosis; Lumbar facet syndrome (Location of Primary Source of Pain) (Bilateral) (L>R); Lumbar spinal stenosis with neurogenic claudication; Chronic pain syndrome; Chronic low back pain (Location of Primary Source of Pain) (Bilateral) (L>R); History of total knee replacement (Left); History of femur fracture (Right); 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);  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 Her primarily concern today is the Back Pain; Hip Pain; and Knee Pain   Pain Assessment: Self-Reported Pain Score: 0-No pain Reported level is compatible with observation Pain Type: Chronic pain Pain Location: Back Pain Orientation: Lower Pain Descriptors / Indicators: Throbbing, Constant (no pain today) Pain Frequency: Constant  The patient comes into the clinics today for pharmacological management of her chronic pain. The patient still doing great after her left intra-articular hip injection.  Date of Last Visit: 04/06/15 Service Provided on Last Visit: Evaluation (post procedure evaluation)  Controlled Substance Pharmacotherapy Assessment & REMS (Risk Evaluation and Mitigation Strategy)  Analgesic: Oxycodone IR 5 mg every 6 hours (20 mg/day) Pill Count: Patient did not bring her meds to the office today.The patient was given a final warning with regards to this. MME/day: 30 mg/day Pharmacokinetics: Onset of action (Liberation/Absorption): Within expected pharmacological parameters Time to Peak effect (Distribution): Timing and results are as within normal expected parameters Duration of action (Metabolism/Excretion): Within normal limits for medication Pharmacodynamics: Analgesic Effect: More than 50% Activity Facilitation: Medication(s) allow patient to sit, stand, walk, and do the basic ADLs Perceived Effectiveness: Described as relatively effective, allowing for increase in activities of daily living (ADL) Side-effects or Adverse reactions: None reported Monitoring: [place] PMP: Online review of the past 37-month period conducted. Compliant with practice rules and regulations UDS Results/interpretation: The patient's last UDS was done on 11/05/2014 and it came back within normal limits except for the presence of temazepam that had not  been declared. Patient instructed on the CDC guidelines. Medication Assessment Form: Reviewed. Patient indicates being compliant with therapy Treatment compliance: Compliant Risk Assessment: Aberrant Behavior: None observed today Substance Use Disorder (SUD) Risk Level: Low-to-moderate Risk of opioid abuse or dependence: 0.7-3.0% with doses ? 36 MME/day and 6.1-26%  with doses ? 120 MME/day. Opioid Risk Tool (ORT) Score: Total Score: 4 Moderate Risk for SUD (Score between 4-7) Depression Scale Score: PHQ-2: PHQ-2 Total Score: 0 No depression (0) PHQ-9: PHQ-9 Total Score: 0 No depression (0-4)  Pharmacologic Plan: No change in therapy, at this time  Laboratory Chemistry  Inflammation Markers Lab Results  Component Value Date   ESRSEDRATE 10 04/04/2012    Renal Function Lab Results  Component Value Date   BUN 20* 06/12/2013   CREATININE 1.42* 06/12/2013   GFRAA 44* 06/12/2013   GFRNONAA 38* 06/12/2013    Hepatic Function No results found for: AST, ALT, ALBUMIN  Electrolytes Lab Results  Component Value Date   NA 138 06/12/2013   K 4.1 06/12/2013   CL 102 06/12/2013   CALCIUM 8.6 06/12/2013   MG 2.0 06/19/2012    Pain Modulating Vitamins No results found for: VD25OH, VD125OH2TOT, PV9480XK5, VV7482LM7, VITAMINB12  Coagulation Parameters No results found for: INR, LABPROT  Note: I personally reviewed the above data. Results shared with patient.  Meds  The patient has a current medication list which includes the following prescription(s): amlodipine, aspirin ec, vitamin d, duloxetine, esomeprazole, furosemide, hydroxychloroquine, levothyroxine, levothyroxine, loratadine, metoprolol succinate, oxycodone, quetiapine, quetiapine, spironolactone, tramadol, oxycodone, and oxycodone.  Current Outpatient Prescriptions on File Prior to Visit  Medication Sig  . amLODipine (NORVASC) 10 MG tablet Take 5 mg by mouth 2 (two) times daily.   Marland Kitchen aspirin EC 81 MG tablet Take by mouth.   . Cholecalciferol (VITAMIN D) 2000 UNITS tablet Take by mouth daily.   . DULoxetine (CYMBALTA) 30 MG capsule Two tablets in the morning, and one in the evening.  Marland Kitchen esomeprazole (NEXIUM) 20 MG capsule Take by mouth daily.   . furosemide (LASIX) 40 MG tablet TAKE 1 TABLET ONCE DAILY  . hydroxychloroquine (PLAQUENIL) 200 MG tablet TAKE 1 TABLET BY MOUTH TWICE A  DAY AS DIRECTED  . levothyroxine (SYNTHROID, LEVOTHROID) 200 MCG tablet TAKE 1 TABLET ONCE DAILY- ON AN EMPTY STOMACH WITH A GLASS OF WATER 30-60 MINUTES BEFORE BREAKFAST  . levothyroxine (SYNTHROID, LEVOTHROID) 25 MCG tablet TAKE 1 TABLET BY MOUTH EVERY DAY (TAKE WITH 20O MCG TABLET TO EQUAL A 225 MCG DOSE)  . loratadine (CLARITIN) 10 MG tablet Take by mouth daily as needed.   . metoprolol succinate (TOPROL-XL) 50 MG 24 hr tablet TAKE 1 TABLET (50 MG TOTAL) BY MOUTH ONCE DAILY.  Marland Kitchen QUEtiapine (SEROQUEL) 200 MG tablet Take 1 tablet (200 mg total) by mouth at bedtime.  Marland Kitchen QUEtiapine (SEROQUEL) 50 MG tablet Take 1 tablet (50 mg total) by mouth 2 (two) times daily.  Marland Kitchen spironolactone (ALDACTONE) 25 MG tablet TAKE 1/2 (ONE-HALF) TABLET BY MOUTH DAILY  . traMADol (ULTRAM) 50 MG tablet TAKE 1 TABLET BY MOUTH TWICE A DAY AS NEEDED (OK TO FILL EARLY PER MD)   No current facility-administered medications on file prior to visit.    ROS  Constitutional: Afebrile, no chills, well hydrated and well nourished Gastrointestinal: No upper or lower GI bleeding, no nausea, no vomiting and no acute GI distress Musculoskeletal: No acute joint swelling or redness, no acute loss of range of motion and no acute onset weakness Neurological: Denies any acute onset apraxia, no episodes of paralysis, no acute loss of coordination, no acute loss of consciousness and no acute onset aphasia, dysarthria, agnosia, or amnesia  Allergies  Ms. Meda is allergic to penicillins.  PFSH  Medical:  Ms. Pruden  has a past medical history of Anxiety; Depression;  Chronic  kidney disease; Thyroid disease; Allergy; Sleep apnea; and Arthritis, degenerative (10/08/2013). Family: family history includes Alcohol abuse in her father; Depression in her father and sister; Heart attack in her father; Hypertension in her mother and sister; Post-traumatic stress disorder in her father; Rheum arthritis in her sister; Stroke in her mother. Surgical:  has past surgical history that includes Abdominal hysterectomy; Cesarean section; Replacement total knee (Left); and Hip surgery (Right). Tobacco:  reports that she has never smoked. She has never used smokeless tobacco. Alcohol:  reports that she does not drink alcohol. Drug:  reports that she does not use illicit drugs.  Physical Examination  Constitutional Vitals: Blood pressure 160/85, pulse 86, temperature 98.2 F (36.8 C), temperature source Oral, resp. rate 16, height 5\' 4"  (1.626 m), weight 275 lb (124.739 kg), SpO2 97 %. Calculated BMI: Body mass index is 47.18 kg/(m^2). (>40 kg/m2) Extreme obesity (Class III) - 254% higher incidence of chronic pain General appearance: Alert, oriented x 3, in no apparent acute distress. Well nourished, well hydrated Eyes: PERLA Respiratory: No evidence respiratory distress, no audible rales or ronchi and no use of accessory muscles of respiration Psych: Alert, oriented to person, oriented to place and oriented to time  Cervical Spine Exam  Inspection: Normal anatomy, no anomalies observed Cervical Lordosis: Normal Alignment: Symetrical Functional ROM: Within functional limits [facility]) AROM: WFL Sensory: No sensory anomalies reported or detected  Upper Extremity Exam    Right  Left  Inspection: No gross anomalies detected  Inspection: No gross anomalies detected  Functional ROM: Adequate  Functional ROM: Adequate  AROM: Adequate  AROM: Adequate  Sensory: No sensory anomalies reported or detected  Sensory: No sensory anomalies reported or detected  Motor: Unremarkable  Motor:  Unremarkable  Vascular: Normal skin color, temperature, and hair growth. No peripheral edema or cyanosis  Vascular: Normal skin color, temperature, and hair growth. No peripheral edema or cyanosis   Thoracic Spine  Inspection: No gross anomalies detected Alignment: Symetrical Functional ROM: Within functional limits [facility]) AROM: Adequate Palpation: WNL  Lumbar Spine  Inspection: No gross anomalies detected Alignment: Symetrical Functional ROM: Within functional limits (WFL) AROM: Decreased Sensory: No sensory anomalies reported or detected Palpation: Tender Provocative Tests: Lumbar Hyperextension and rotation test: deferred Patrick's Maneuver: deferred  Gait Assessment  Gait: Antalgic (limping). The patient ambulates using a walker. This is partly due to her pain and in part is due to her morbid obesity.  Lower Extremities    Right  Left  Inspection: No gross anomalies detected  Inspection: No gross anomalies detected  Functional ROM: Limited hip motion   Functional ROM: Limited hip motion   AROM: Decreased hip motion   AROM: Decreased hip motion   Sensory: No sensory anomalies reported or detected  Sensory: No sensory anomalies reported or detected  Motor: Unremarkable  Motor: Unremarkable  Toe walk (S1): WNL  Toe walk (S1): WNL  Heal walk (L5): WNL  Heal walk (L5): WNL   Assessment & Plan  Primary Diagnosis & Pertinent Problem List: The primary encounter diagnosis was Chronic pain. Diagnoses of Encounter for therapeutic drug level monitoring, Long term current use of opiate analgesic, Opiate use (30 MME/Day), and Chronic low back pain (Location of Primary Source of Pain) (Bilateral) (L>R) were also pertinent to this visit.  Visit Diagnosis: 1. Chronic pain   2. Encounter for therapeutic drug level monitoring   3. Long term current use of opiate analgesic   4. Opiate use (30 MME/Day)   5. Chronic  low back pain (Location of Primary Source of Pain) (Bilateral) (L>R)      Problems updated and reviewed during this visit: No problems updated.  Problem-specific Plan(s): No problem-specific assessment & plan notes found for this encounter.  No new assessment & plan notes have been filed under this hospital service since the last note was generated. Service: Pain Management   Plan of Care   Problem List Items Addressed This Visit      High   Chronic low back pain (Location of Primary Source of Pain) (Bilateral) (L>R) (Chronic)   Relevant Medications   oxyCODONE (OXY IR/ROXICODONE) 5 MG immediate release tablet   oxyCODONE (OXY IR/ROXICODONE) 5 MG immediate release tablet   oxyCODONE (OXY IR/ROXICODONE) 5 MG immediate release tablet   Chronic pain - Primary (Chronic)   Relevant Medications   oxyCODONE (OXY IR/ROXICODONE) 5 MG immediate release tablet   oxyCODONE (OXY IR/ROXICODONE) 5 MG immediate release tablet   oxyCODONE (OXY IR/ROXICODONE) 5 MG immediate release tablet     Medium   Encounter for therapeutic drug level monitoring   Long term current use of opiate analgesic (Chronic)   Relevant Orders   ToxASSURE Select 13 (MW), Urine   Opiate use (30 MME/Day) (Chronic)       Pharmacotherapy (Medications Ordered): Meds ordered this encounter  Medications  . oxyCODONE (OXY IR/ROXICODONE) 5 MG immediate release tablet    Sig: Take 1 tablet (5 mg total) by mouth every 6 (six) hours as needed for severe pain.    Dispense:  120 tablet    Refill:  0    Do not add this medication to the electronic "Automatic Refill" notification system. Patient may have prescription filled one day early if pharmacy is closed on scheduled refill date. Do not fill until: 05/31/15 To last until: 06/30/15  . oxyCODONE (OXY IR/ROXICODONE) 5 MG immediate release tablet    Sig: Take 1 tablet (5 mg total) by mouth every 6 (six) hours as needed for severe pain.    Dispense:  120 tablet    Refill:  0    Do not add this medication to the electronic "Automatic Refill"  notification system. Patient may have prescription filled one day early if pharmacy is closed on scheduled refill date. Do not fill until: 06/30/15 To last until: 07/30/15  . oxyCODONE (OXY IR/ROXICODONE) 5 MG immediate release tablet    Sig: Take 1 tablet (5 mg total) by mouth every 6 (six) hours as needed for severe pain.    Dispense:  120 tablet    Refill:  0    Do not add this medication to the electronic "Automatic Refill" notification system. Patient may have prescription filled one day early if pharmacy is closed on scheduled refill date. Do not fill until: 05/01/15 To last until: 05/31/15    [facility] & Procedure Ordered: Orders Placed This Encounter  Procedures  . ToxASSURE Select 13 (MW), Urine    Imaging Ordered: None  Interventional Therapies: Scheduled:  None at this time.    Considering:  None at this time.    PRN Procedures:  Repeat negative intra-articular hip injection.    Referral(s) or Consult(s): None at this time.  New Prescriptions   OXYCODONE (OXY IR/ROXICODONE) 5 MG IMMEDIATE RELEASE TABLET    Take 1 tablet (5 mg total) by mouth every 6 (six) hours as needed for severe pain.   OXYCODONE (OXY IR/ROXICODONE) 5 MG IMMEDIATE RELEASE TABLET    Take 1 tablet (5 mg total) by mouth every  6 (six) hours as needed for severe pain.    Medications administered during this visit: Ms. Lewerenz had no medications administered during this visit.  Future Appointments Date Time Provider Department Center  05/07/2015 9:45 AM Lynnea Maizes, PT [facility] None  05/12/2015 9:45 AM Shin-Yiing Dayle Points, PT [facility] None  05/13/2015 11:15 AM Carlyon Shadow Tortorici, PT [facility] None  05/19/2015 1:30 PM Carlyon Shadow Tortorici, PT [facility] None  05/21/2015 11:15 AM Shin-Yiing Dayle Points, PT [facility] None  05/26/2015 1:00 PM Carlyon Shadow Tortorici, PT [facility] None  05/28/2015 11:15 AM Shin-Yiing Dayle Points, PT [facility] None  06/02/2015 1:00 PM Carlyon Shadow Tortorici, PT [facility] None  06/04/2015 11:15 AM  Shin-Yiing Dayle Points, PT [facility] None  06/09/2015 1:00 PM Carlyon Shadow Tortorici, PT [facility] None  06/11/2015 11:15 AM Shin-Yiing Dayle Points, PT [facility] None  07/22/2015 1:30 PM Himabindu Daleen Bo, MD [facility] None  07/23/2015 1:00 PM Delano Metz, MD [facility] None    Primary Care Physician: Leotis Shames, MD Location: [facility] Note by: Sydnee Levans. Laban Emperor, M.D, DABA, DABAPM, DABPM, DABIPP, FIPP  Pain Score Disclaimer: We use the NRS-11 scale. This is a self-reported, subjective measurement of pain severity with only modest accuracy. It is used primarily to identify changes within a particular patient. It must be understood that outpatient pain scales are significantly less accurate that those used for research, where they can be applied under ideal controlled circumstances with minimal exposure to variables. In reality, the score is likely to be a combination of pain intensity and pain affect, where pain affect describes the degree of emotional arousal or changes in action readiness caused by the sensory experience of pain. Factors such as social and work situation, setting, emotional state, anxiety levels, expectation, and prior pain experience may influence pain perception and show large inter-individual differences that may also be affected by time variables.

## 2015-05-04 ENCOUNTER — Ambulatory Visit: Payer: Medicare Other

## 2015-05-07 ENCOUNTER — Ambulatory Visit: Payer: PRIVATE HEALTH INSURANCE

## 2015-05-07 ENCOUNTER — Ambulatory Visit: Payer: Medicare Other

## 2015-05-07 DIAGNOSIS — R2681 Unsteadiness on feet: Secondary | ICD-10-CM | POA: Diagnosis not present

## 2015-05-08 LAB — TOXASSURE SELECT 13 (MW), URINE: PDF: 0

## 2015-05-08 NOTE — Therapy (Signed)
[facility] MAIN [facility] SERVICES [address] [place], [place], [address] Phone: [phone]   Fax:  [phone]  Physical Therapy Treatment  Patient Details  Name: Laura [name] MRN: [id number] Date of Birth: 06-06-1945 Referring Provider: Thedore Mins  Encounter Date: 05/07/2015      PT End of Session - 05/08/15 1051    Visit Number 7   Number of Visits 17   Date for PT Re-Evaluation 05/11/15   Authorization Type 7/10   PT Start Time 0945   PT Stop Time 1030   PT Time Calculation (min) 45 min   Equipment Utilized During Treatment Gait belt   Activity Tolerance Patient limited by fatigue;Patient limited by pain   Behavior During Therapy [facility] for tasks assessed/performed      Past Medical History  Diagnosis Date  . Anxiety   . Depression   . Chronic kidney disease   . Thyroid disease   . Allergy   . Sleep apnea   . Arthritis, degenerative 10/08/2013    Overview:    a.  Lumbar spine/spinal stenosis/foot drop.   b.  Hands.     Past Surgical History  Procedure Laterality Date  . Abdominal hysterectomy    . Cesarean section    . Replacement total knee Left   . Hip surgery Right     There were no vitals filed for this visit.      Subjective Assessment - 05/08/15 1049    Subjective Pt states she is doing well on this date. She feels like aquatic therapy has been very beneficial for her. No specific questions or concerns at this time.    Patient is accompained by: Family member  Brett Canales husband   Pertinent History extensive stenosis, L knee TKA, hip OA   How long can you stand comfortably? 3 min   Patient Stated Goals Improve balance, be able to exercise with less pain        AQUATIC THERAPY TREATMENT   Pt entered/exited pool via ramp with B UE on rail using sidestepping pattern  Exercises were performed in 3'6" depth Forward, backward and side stepping with pool noodle at first on both sides and then in front (easier for  patient), close CGA provided; Standing hip flexion, extension, abduction, heel raises, and mini squats 1 minute each x 2, BUE support on wall; Seated bicycle, flutter, and scissor kicks x 1 minute each; Education with patient about HEP and plan of care.                          PT Education - 05/08/15 1050    Education provided Yes   Education Details Reinforced HEP and plan of care   Person(s) Educated Patient   Methods Explanation   Comprehension Verbalized understanding             PT Long Term Goals - 04/13/15 1700    PT LONG TERM GOAL #1   Title pt will be able to stand with light UE support x 5 min to assist with meal prep   Time 8   Period Weeks   Status New   PT LONG TERM GOAL #2   Title pt will perform 5 x sit to stand in <15s to demo improved LE strength    Time 8   Period Weeks   Status New   PT LONG TERM GOAL #3   Title pt will improve 42m walk to 0.59m/s to normalize  home mobility    Time 8   Period Weeks   Status New   PT LONG TERM GOAL #4   Title pt will imrpove berg balance test by 6 pts to reduce fall risk    Time 8   Period Weeks   Status New               Plan - 05/08/15 1051    Clinical Impression Statement Pt demonstrates improving confidence with balance during walking with increased distance. She reports ability to perform exercises such as squats and heel raises in the pool that she cannot complete on land. Pt believes that aquatic therapy has been very beneficial for her strength as well as pain levels. Pt encouraged to continue HEP and follow-up as scheduled.    Rehab Potential Good   Clinical Impairments Affecting Rehab Potential co-morbidities    PT Frequency 2x / week   PT Duration 8 weeks   PT Treatment/Interventions ADLs/Self Care Home Management;Aquatic Therapy;Patient/family education;Neuromuscular re-education;Balance training;Therapeutic exercise;Therapeutic activities;Functional mobility training;Gait  training;Manual techniques;Passive range of motion   PT Next Visit Plan Continue strengthening and balance, progress HEP as appropriate   PT Home Exercise Plan Seated LAQ with theraband, standing hip abduction, standing marches (to improve SLS balance), sit to stand without UE support   Consulted and Agree with Plan of Care Patient      Patient will benefit from skilled therapeutic intervention in order to improve the following deficits and impairments:  Decreased strength, Pain, Decreased mobility, Difficulty walking, Decreased endurance, Decreased balance, Decreased activity tolerance  Visit Diagnosis: Unsteadiness on feet     Problem List Patient Active Problem List   Diagnosis Date Noted  . Chronic hip pain (Left) 04/06/2015  . History of total knee replacement (Left) 02/09/2015  . History of femur fracture (Right) 02/09/2015  . 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 Lumbar foraminal stenosis (Bilateral L3-4 and L5-S1) 02/09/2015  . Chronic low back pain (Location of Primary Source of Pain) (Bilateral) (L>R) 01/22/2015  . Chronic pain syndrome 11/25/2014  . Opioid dependence (HCC) 11/07/2014  . Lumbar spinal stenosis (5 mm Severe L3-4; 8 mm L4-5) 11/07/2014  . Lumbar spondylosis 11/07/2014  . Lumbar facet syndrome (Location of Primary Source of Pain) (Bilateral) (L>R) 11/07/2014  . 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 Chronic pain 11/05/2014  . Chronic knee pain (Right) 11/05/2014  . Osteoarthritis of knee (Right) 11/05/2014  . Opiate use (30 MME/Day) 11/05/2014  . Long term current use of opiate analgesic 11/05/2014  . Long term prescription opiate use 11/05/2014  . Encounter for therapeutic drug level monitoring 11/05/2014  . Morbid obesity (HCC) 10/27/2014  . Diabetes mellitus, type 2 (HCC) 10/07/2014  . Extreme obesity (HCC) 10/07/2014  . Seizure (HCC) 10/07/2014  .  Gastro-esophageal reflux disease without esophagitis 06/24/2014  . Memory loss, short term 03/19/2014  . Type 2 diabetes mellitus (HCC) 03/19/2014  . Acquired hypothyroidism 01/14/2014  . Clinical depression 10/14/2013  . Essential (primary) hypertension 10/14/2013  . Anxiety 07/09/2013  . Cannot sleep 07/09/2013  . Acid reflux 01/18/2013  . Adult hypothyroidism 01/18/2013  . HLD (hyperlipidemia) 01/18/2013  . Rheumatoid arthritis without elevated rheumatoid factor (HCC) 01/18/2013  . 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 Chronic kidney disease (CKD), stage III (moderate) 01/17/2013  . Congestive heart failure with left ventricular systolic dysfunction (HCC) 11/10/2012   Lynnea Maizes PT, DPT   Gaetana Kawahara 05/08/2015,  11:03 AM  [place] [facility] MAIN [facility] SERVICES [address] [place], [place], [address] Phone: [phone]   Fax:  [phone]  Name: Laura [name] MRN: [id number] Date of Birth: Aug 29, 1945

## 2015-05-12 ENCOUNTER — Ambulatory Visit: Payer: Medicare Other | Attending: Internal Medicine | Admitting: Physical Therapy

## 2015-05-12 ENCOUNTER — Ambulatory Visit: Payer: PRIVATE HEALTH INSURANCE

## 2015-05-12 DIAGNOSIS — R2681 Unsteadiness on feet: Secondary | ICD-10-CM | POA: Diagnosis present

## 2015-05-13 ENCOUNTER — Ambulatory Visit: Payer: Medicare Other

## 2015-05-13 DIAGNOSIS — R2681 Unsteadiness on feet: Secondary | ICD-10-CM

## 2015-05-13 NOTE — Therapy (Signed)
[place] [facility] MAIN [facility] SERVICES [address] [place], [place], [address] Phone: [phone]   Fax:  [phone]  Physical Therapy Treatment  Patient Details  Name: Laura [name] MRN: [id number] Date of Birth: [birth year] Referring Provider: Thedore Mins  Encounter Date: 05/12/2015      PT End of Session - 05/13/15 0747    Visit Number 8   Number of Visits 17   Date for PT Re-Evaluation 05/11/15   Authorization Type 8/10   PT Start Time 0950   PT Stop Time 1035   PT Time Calculation (min) 45 min   Equipment Utilized During Treatment Gait belt   Activity Tolerance Patient limited by fatigue;Patient limited by pain   Behavior During Therapy [facility] for tasks assessed/performed      Past Medical History  Diagnosis Date  . Anxiety   . Depression   . Chronic kidney disease   . Thyroid disease   . Allergy   . Sleep apnea   . Arthritis, degenerative 10/08/2013    Overview:    a.  Lumbar spine/spinal stenosis/foot drop.   b.  Hands.     Past Surgical History  Procedure Laterality Date  . Abdominal hysterectomy    . Cesarean section    . Replacement total knee Left   . Hip surgery Right     There were no vitals filed for this visit.      Subjective Assessment - 05/12/15 1016    Subjective Pt states she was able to walk backwards at last session. Pt does not feel an changes to her back pain when on land.    Patient is accompained by: Family member  Brett Canales husband   Pertinent History extensive stenosis, L knee TKA, hip OA   How long can you stand comfortably? 3 min   Patient Stated Goals Improve balance, be able to exercise with less pain                      Adult Aquatic Therapy - 05/13/15 0747    Aquatic Therapy Subjective   Subjective Pt had no complaints during/after session.       O: Pt entered pool via ramp with BUE on rail, sidestepping. Pt exited pool via ramp with BUE forward walking 3/4 the ramp, sidestepping  1/4 ramp  ~10 sec static balance no UE support Semi tandem stepping backwards, 30 reps each leg, no UE support 6 laps 50 ft each walking with 2 noodles 2 laps 50 ft each walking without UE support, minor LOB, cued for unlocked knees  2 x sit to stand 2 rest breaks ~3 mins 5' min cool down on bench with education on maintaining walking at home                PT Long Term Goals - 04/13/15 1700    PT LONG TERM GOAL #1   Title pt will be able to stand with light UE support x 5 min to assist with meal prep   Time 8   Period Weeks   Status New   PT LONG TERM GOAL #2   Title pt will perform 5 x sit to stand in <15s to demo improved LE strength    Time 8   Period Weeks   Status New   PT LONG TERM GOAL #3   Title pt will improve 65m walk to 0.52m/s to normalize home mobility    Time 8   Period Weeks  Status New   PT LONG TERM GOAL #4   Title pt will imrpove berg balance test by 6 pts to reduce fall risk    Time 8   Period Weeks   Status New               Plan - 05/13/15 6578    Clinical Impression Statement Pt showed good carry over with stand<> sit t/f with less cuing. Pt was able to walk in 3'6" pool depth without UE support 137ft, demonstraing increased balance and stability. Pt required cuing to decrease hyperextension of knees and to maintain more anterior COM over feet. Pt also showed increased endurance with less rest breaks. Pt is advancing towards her goals.    Rehab Potential Good   Clinical Impairments Affecting Rehab Potential co-morbidities    PT Frequency 2x / week   PT Duration 8 weeks   PT Treatment/Interventions ADLs/Self Care Home Management;Aquatic Therapy;Patient/family education;Neuromuscular re-education;Balance training;Therapeutic exercise;Therapeutic activities;Functional mobility training;Gait training;Manual techniques;Passive range of motion   PT Next Visit Plan Continue strengthening and balance, progress HEP as appropriate   PT Home  Exercise Plan Seated LAQ with theraband, standing hip abduction, standing marches (to improve SLS balance), sit to stand without UE support   Consulted and Agree with Plan of Care Patient      Patient will benefit from skilled therapeutic intervention in order to improve the following deficits and impairments:  Decreased strength, Pain, Decreased mobility, Difficulty walking, Decreased endurance, Decreased balance, Decreased activity tolerance  Visit Diagnosis: Unsteadiness on feet     Problem List Patient Active Problem List   Diagnosis Date Noted  . Chronic hip pain (Left) 04/06/2015  . History of total knee replacement (Left) 02/09/2015  . History of femur fracture (Right) 02/09/2015  . 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 Lumbar foraminal stenosis (Bilateral L3-4 and L5-S1) 02/09/2015  . Chronic low back pain (Location of Primary Source of Pain) (Bilateral) (L>R) 01/22/2015  . Chronic pain syndrome 11/25/2014  . Opioid dependence (HCC) 11/07/2014  . Lumbar spinal stenosis (5 mm Severe L3-4; 8 mm L4-5) 11/07/2014  . Lumbar spondylosis 11/07/2014  . Lumbar facet syndrome (Location of Primary Source of Pain) (Bilateral) (L>R) 11/07/2014  . 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 Chronic pain 11/05/2014  . Chronic knee pain (Right) 11/05/2014  . Osteoarthritis of knee (Right) 11/05/2014  . Opiate use (30 MME/Day) 11/05/2014  . Long term current use of opiate analgesic 11/05/2014  . Long term prescription opiate use 11/05/2014  . Encounter for therapeutic drug level monitoring 11/05/2014  . Morbid obesity (HCC) 10/27/2014  . Diabetes mellitus, type 2 (HCC) 10/07/2014  . Extreme obesity (HCC) 10/07/2014  . Seizure (HCC) 10/07/2014  . Gastro-esophageal reflux disease without esophagitis 06/24/2014  . Memory loss, short term 03/19/2014  . Type 2 diabetes mellitus (HCC) 03/19/2014   . Acquired hypothyroidism 01/14/2014  . Clinical depression 10/14/2013  . Essential (primary) hypertension 10/14/2013  . Anxiety 07/09/2013  . Cannot sleep 07/09/2013  . Acid reflux 01/18/2013  . Adult hypothyroidism 01/18/2013  . HLD (hyperlipidemia) 01/18/2013  . Rheumatoid arthritis without elevated rheumatoid factor (HCC) 01/18/2013  . 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 Chronic kidney disease (CKD), stage III (moderate) 01/17/2013  . Congestive heart failure with left ventricular systolic dysfunction (HCC) 11/10/2012    Mariane Masters ,PT, DPT, E-RYT  05/13/2015, 7:51 AM  [place] [facility] MAIN REHAB  SERVICES [address] [place], [place], [address] Phone: [phone]   Fax:  [phone]  Name: Carlia Bomkamp MRN: [id number] Date of Birth: 08-19-1945

## 2015-05-13 NOTE — Patient Instructions (Addendum)
WALKING  Walking is a great form of exercise to increase your strength, endurance and overall fitness.  A walking program can help you start slowly and gradually build endurance as you go.  Everyone's ability is different, so each person's starting point will be different.  You do not have to follow them exactly.  The are just samples. You should simply find out what's right for you and stick to that program.   In the beginning, you'll start off walking 2-3 times a day for short distances.  As you get stronger, you'll be walking further at just 1-2 times per day.  A. You Can Walk For A Certain Length Of Time Each Day    Walk 5 minutes 3 times per day.  Increase 2 minutes every 2 days (3 times per day).  Work up to 25-30 minutes (1-2 times per day).   Example:   Day 1-2 5 minutes 3 times per day   Day 7-8 12 minutes 2-3 times per day   Day 13-14 25 minutes 1-2 times per day  B. You Can Walk For a Certain Distance Each Day     Distance can be substituted for time.    Example:   3 trips to mailbox (at road)   3 trips to corner of block   3 trips around the block   [email or web address] Standing squat 2x10 Standing hip abduction 2x10 Standing hip ext 2x10 Standing march 2x10

## 2015-05-13 NOTE — Therapy (Signed)
[place] MAIN [facility] SERVICES [address] [place], [place], [address] Phone: [phone]   Fax:  [phone]  Physical Therapy Treatment/Progress note  Patient Details  Name: Laura [name] MRN: [id number] Date of Birth: 05/16/1945 Referring Provider: Candiss Norse  Encounter Date: 05/13/2015      PT End of Session - 05/13/15 1243    Visit Number 9   Number of Visits 17   Date for PT Re-Evaluation 06/10/15   Authorization Type 1/10   PT Start Time 1120   PT Stop Time 1205   PT Time Calculation (min) 45 min   Equipment Utilized During Treatment Gait belt   Activity Tolerance Patient limited by fatigue;Patient limited by pain   Behavior During Therapy [facility] for tasks assessed/performed      Past Medical History  Diagnosis Date  . Anxiety   . Depression   . Chronic kidney disease   . Thyroid disease   . Allergy   . Sleep apnea   . Arthritis, degenerative 10/08/2013    Overview:    a.  Lumbar spine/spinal stenosis/foot drop.   b.  Hands.     Past Surgical History  Procedure Laterality Date  . Abdominal hysterectomy    . Cesarean section    . Replacement total knee Left   . Hip surgery Right     There were no vitals filed for this visit.      Subjective Assessment - 05/13/15 1241    Subjective pt reports she hasnt been very consistant with HEP, but she loves the pool and feels like she is having more energy   Patient is accompained by: Family member  [name] husband   Pertinent History extensive stenosis, L knee TKA, hip OA   How long can you stand comfortably? 3 min   Patient Stated Goals Improve balance, be able to exercise with less pain    Currently in Pain? Yes   Pain Score 6    Pain Location Back   Pain Onset More than a month ago            [facility] PT Assessment - 05/13/15 0001    Standardized Balance Assessment   Five times sit to stand comments  17.0   10 Meter Walk 0.50ms  pt demonstrates more upright posture  during gait   Berg Balance Test   Sit to Stand Able to stand  independently using hands   Standing Unsupported Needs several tries to stand 30 seconds unsupported   Sitting with Back Unsupported but Feet Supported on Floor or Stool Able to sit safely and securely 2 minutes   Stand to Sit Controls descent by using hands   Transfers Able to transfer safely, definite need of hands   Standing Unsupported with Eyes Closed Able to stand 3 seconds   Standing Ubsupported with Feet Together Needs help to attain position and unable to hold for 15 seconds   From Standing, Reach Forward with Outstretched Arm Can reach forward >5 cm safely (2")   From Standing Position, Pick up Object from Floor Unable to pick up and needs supervision   From Standing Position, Turn to Look Behind Over each Shoulder Turn sideways only but maintains balance   Turn 360 Degrees Needs assistance while turning   Standing Unsupported, Alternately Place Feet on Step/Stool Needs assistance to keep from falling or unable to try   Standing Unsupported, One Foot in Front Needs help to step but can hold 15 seconds   Standing on  One Leg Unable to try or needs assist to prevent fall   Total Score 22    34mn walk: 4092f(2 min rest after 464m15m using Rollator              Adult Aquatic Therapy - 05/May 17, 201747    Aquatic Therapy Subjective   Subjective Pt had no complaints during/after session.                    PT Education - 05/2015-05-1740    Education provided Yes   Education Details monthly progress note, HEP, walking program             PT Long Term Goals - 05/15/15/201745    PT LONG TERM GOAL #1   Title pt will be able to stand with light UE support x 5 min to assist with meal prep   Time 8   Period Weeks   Status Partially Met   PT LONG TERM GOAL #2   Title pt will perform 5 x sit to stand in <15s to demo improved LE strength    Time 8   Period Weeks   Status Partially Met   PT LONG TERM  GOAL #3   Title pt will improve 74m3mk to 0.64m/s764m normalize home mobility    Time 8   Period Weeks   Status Partially Met   PT LONG TERM GOAL #4   Title pt will imrpove berg balance test by 6 pts to reduce fall risk    Time 8   Period Weeks   Status Partially Met   PT LONG TERM GOAL #5   Title pt will improve 3m wa54mdistance by 150ft t42fprove endurance and community mobilty such as Dr. apts.  Bobbye Riggsme 8   Period Weeks   Status New               Plan - 05/03/1May 17, 2017  Clinical Impression Statement pt has made some progress towards her goals. her balance is a little better, 5xsit to stand was faster, and gait speed was a little quicker. pt is making good progression of exercises especially in aquatic therapy. pts progress is likely hindered by her co-morbidities and her poor compliance to HEP. pt was spoken to about thsi and increasing her endurance with walking program in order to achieve her goals and continue therapy. pt did 3min wa21mtest today needing a 2 min rest break in the middle. she walked 400ft wit57fcouragement for the remaining 200ft.    50fb Potential Good   Clinical Impairments Affecting Rehab Potential co-morbidities    PT Frequency 2x / week   PT Duration 8 weeks   PT Treatment/Interventions ADLs/Self Care Home Management;Aquatic Therapy;Patient/family education;Neuromuscular re-education;Balance training;Therapeutic exercise;Therapeutic activities;Functional mobility training;Gait training;Manual techniques;Passive range of motion   PT Next Visit Plan Continue strengthening and balance, progress HEP as appropriate   PT Home Exercise Plan Seated LAQ with theraband, standing hip abduction, standing marches (to improve SLS balance), sit to stand without UE support   Consulted and Agree with Plan of Care Patient      Patient will benefit from skilled therapeutic intervention in order to improve the following deficits and impairments:  Decreased strength,  Pain, Decreased mobility, Difficulty walking, Decreased endurance, Decreased balance, Decreased activity tolerance  Visit Diagnosis: Unsteadiness on feet - Plan: PT plan of care cert/re-cert       G-Codes - 05/13/15 1May 17, 2017unctional  Assessment Tool Used berg/51malk/6minwalk/5xsittostand   Functional Limitation Mobility: Walking and moving around   Mobility: Walking and Moving Around Current Status [phone] At least 40 percent but less than 60 percent impaired, limited or restricted   Mobility: Walking and Moving Around Goal Status [phone] At least 20 percent but less than 40 percent impaired, limited or restricted      Problem List Patient Active Problem List   Diagnosis Date Noted  . Chronic hip pain (Left) 04/06/2015  . History of total knee replacement (Left) 02/09/2015  . History of femur fracture (Right) 02/09/2015  . 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 Lumbar foraminal stenosis (Bilateral L3-4 and L5-S1) 02/09/2015  . Chronic low back pain (Location of Primary Source of Pain) (Bilateral) (L>R) 01/22/2015  . Chronic pain syndrome 11/25/2014  . Opioid dependence (HEbro 11/07/2014  . Lumbar spinal stenosis (5 mm Severe L3-4; 8 mm L4-5) 11/07/2014  . Lumbar spondylosis 11/07/2014  . Lumbar facet syndrome (Location of Primary Source of Pain) (Bilateral) (L>R) 11/07/2014  . 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 Chronic pain 11/05/2014  . Chronic knee pain (Right) 11/05/2014  . Osteoarthritis of knee (Right) 11/05/2014  . Opiate use (30 MME/Day) 11/05/2014  . Long term current use of opiate analgesic 11/05/2014  . Long term prescription opiate use 11/05/2014  . Encounter for therapeutic drug level monitoring 11/05/2014  . Morbid obesity ([place] 10/27/2014  . Diabetes mellitus, type 2 ([place] 10/07/2014  . Extreme obesity ([place] 10/07/2014  . Seizure ([place] 10/07/2014  .  Gastro-esophageal reflux disease without esophagitis 06/24/2014  . Memory loss, short term 03/19/2014  . Type 2 diabetes mellitus (HC[place] 03/19/2014  . Acquired hypothyroidism 01/14/2014  . Clinical depression 10/14/2013  . Essential (primary) hypertension 10/14/2013  . Anxiety 07/09/2013  . Cannot sleep 07/09/2013  . Acid reflux 01/18/2013  . Adult hypothyroidism 01/18/2013  . HLD (hyperlipidemia) 01/18/2013  . Rheumatoid arthritis without elevated rheumatoid factor (HC[place] 01/18/2013  . Seronegative rheumatoid arthritis ([place] 01/18/2013  . Chronic kidney disease (CKD), stage III (moderate) 01/17/2013  . Congestive heart failure with left ventricular systolic dysfunction (HC[place] 11/10/2012   ACaryl PinaC. Charvis Lightner, PT, DPT #[id number] Nao Linz 05/13/2015, 12:47 PM  [place] [facility] [address] [address]Phone: [phone]  Fax:  [phone] Name: EGean LaursenMRN: [id number] of Birth: [birth year]

## 2015-05-14 ENCOUNTER — Ambulatory Visit: Payer: PRIVATE HEALTH INSURANCE

## 2015-05-19 ENCOUNTER — Ambulatory Visit: Payer: Medicare Other

## 2015-05-19 DIAGNOSIS — R2681 Unsteadiness on feet: Secondary | ICD-10-CM | POA: Diagnosis not present

## 2015-05-19 NOTE — Therapy (Signed)
[place] MAIN [facility] SERVICES [address] [place], [place], [address] Phone: [phone]   Fax:  [phone]  Physical Therapy Treatment  Patient Details  Name: Laura [name] MRN: [id number] Date of Birth: Jan 08, 1946 Referring Provider: Candiss Norse  Encounter Date: 05/19/2015      PT End of Session - 05/19/15 1431    Visit Number 10   Number of Visits 17   Date for PT Re-Evaluation 06/10/15   Authorization Type 2/10   PT Start Time 1330   PT Stop Time 1415   PT Time Calculation (min) 45 min   Equipment Utilized During Treatment Gait belt   Activity Tolerance Patient limited by fatigue;Patient limited by pain   Behavior During Therapy [facility] for tasks assessed/performed      Past Medical History  Diagnosis Date  . Anxiety   . Depression   . Chronic kidney disease   . Thyroid disease   . Allergy   . Sleep apnea   . Arthritis, degenerative 10/08/2013    Overview:    a.  Lumbar spine/spinal stenosis/foot drop.   b.  Hands.     Past Surgical History  Procedure Laterality Date  . Abdominal hysterectomy    . Cesarean section    . Replacement total knee Left   . Hip surgery Right     There were no vitals filed for this visit.      Subjective Assessment - 05/19/15 1428    Subjective pt reports she hasnt been very consistant with HEP, but she loves the pool and feels like she is having more energy   Patient is accompained by: Family member  [name] husband   Pertinent History extensive stenosis, L knee TKA, hip OA   How long can you stand comfortably? 3 min   Patient Stated Goals Improve balance, be able to exercise with less pain    Currently in Pain? No/denies   Pain Onset More than a month ago         therex: Nustep LE only L 2 x 2 min then UE + LE L 5 x 2 min cues to keep SPM>60 Sit to stand from elevated mat table 4x5 no UE cues for fwd weight shift  Side stepping in // bars x 3 laps including 5 hip cirlces at each end   Standing balance no UE 30s x 3 cues for slight knee flexion Standing overhead ball lift 2x10 with min A  stnading trunk turns with ball 2x10 with min A Pt requires min verbal and tactile cues for proper exercise performance    pt requires CGA for safety on balance exercises                          PT Education - 05/19/15 1430    Education provided Yes   Education Details knees unlocked during standing to engage core and shift COG fwd   Person(s) Educated Patient   Methods Explanation   Comprehension Verbalized understanding             PT Long Term Goals - 05/13/15 1245    PT LONG TERM GOAL #1   Title pt will be able to stand with light UE support x 5 min to assist with meal prep   Time 8   Period Weeks   Status Partially Met   PT LONG TERM GOAL #2   Title pt will perform 5 x sit to stand in <15s to  demo improved LE strength    Time 8   Period Weeks   Status Partially Met   PT LONG TERM GOAL #3   Title pt will improve 21mwalk to 0.824m to normalize home mobility    Time 8   Period Weeks   Status Partially Met   PT LONG TERM GOAL #4   Title pt will imrpove berg balance test by 6 pts to reduce fall risk    Time 8   Period Weeks   Status Partially Met   PT LONG TERM GOAL #5   Title pt will improve 44m5mlk distance by 150f36f improve endurance and community mobilty such as Dr. aptsBobbye Riggs Time 8   Period Weeks   Status New               Plan - 05/19/15 1431    Clinical Impression Statement pt reprots she has been walking more at home to work on her endurance. pt demonstartes improved balance today able to stand wihtout UE support x 30s. pt was somewhat limited today by R sciatic pain, but was improved some with flexion based stretching exercises.    Rehab Potential Good   Clinical Impairments Affecting Rehab Potential co-morbidities    PT Frequency 2x / week   PT Duration 8 weeks   PT Treatment/Interventions ADLs/Self Care Home  Management;Aquatic Therapy;Patient/family education;Neuromuscular re-education;Balance training;Therapeutic exercise;Therapeutic activities;Functional mobility training;Gait training;Manual techniques;Passive range of motion   PT Next Visit Plan Continue strengthening and balance, progress HEP as appropriate   PT Home Exercise Plan Seated LAQ with theraband, standing hip abduction, standing marches (to improve SLS balance), sit to stand without UE support   Consulted and Agree with Plan of Care Patient      Patient will benefit from skilled therapeutic intervention in order to improve the following deficits and impairments:  Decreased strength, Pain, Decreased mobility, Difficulty walking, Decreased endurance, Decreased balance, Decreased activity tolerance  Visit Diagnosis: Unsteadiness on feet     Problem List Patient Active Problem List   Diagnosis Date Noted  . Chronic hip pain (Left) 04/06/2015  . History of total knee replacement (Left) 02/09/2015  . History of femur fracture (Right) 02/09/2015  . 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 Lumbar foraminal stenosis (Bilateral L3-4 and L5-S1) 02/09/2015  . Chronic low back pain (Location of Primary Source of Pain) (Bilateral) (L>R) 01/22/2015  . Chronic pain syndrome 11/25/2014  . Opioid dependence (HCC)[place]/28/2016  . Lumbar spinal stenosis (5 mm Severe L3-4; 8 mm L4-5) 11/07/2014  . Lumbar spondylosis 11/07/2014  . Lumbar facet syndrome (Location of Primary Source of Pain) (Bilateral) (L>R) 11/07/2014  . 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 Chronic pain 11/05/2014  . Chronic knee pain (Right) 11/05/2014  . Osteoarthritis of knee (Right) 11/05/2014  . Opiate use (30 MME/Day) 11/05/2014  . Long term current use of opiate analgesic 11/05/2014  . Long term prescription opiate use 11/05/2014  . Encounter for therapeutic drug level  monitoring 11/05/2014  . Morbid obesity (HCC)[place]/17/2016  . Diabetes mellitus, type 2 (HCC)[place]/27/2016  . Extreme obesity (HCC)[place]/27/2016  . Seizure (HCC)[place]/27/2016  . Gastro-esophageal reflux disease without esophagitis 06/24/2014  . Memory loss, short term 03/19/2014  . Type 2 diabetes mellitus (HCC)[place]/09/2014  . Acquired hypothyroidism 01/14/2014  . Clinical depression 10/14/2013  . Essential (primary) hypertension 10/14/2013  . Anxiety 07/09/2013  . Cannot  sleep 07/09/2013  . Acid reflux 01/18/2013  . Adult hypothyroidism 01/18/2013  . HLD (hyperlipidemia) 01/18/2013  . Rheumatoid arthritis without elevated rheumatoid factor ([place]) 01/18/2013  . Seronegative rheumatoid arthritis ([place]) 01/18/2013  . Chronic kidney disease (CKD), stage III (moderate) 01/17/2013  . Congestive heart failure with left ventricular systolic dysfunction ([place]) 11/10/2012   Caryl Pina C. Romain Erion, PT, DPT [phone]  Laura [name] 05/19/2015, 3:10 PM  [place] MAIN [facility] SERVICES [address] [place], [place], [address] Phone: [phone]   Fax:  [phone]  Name: Laura [name] MRN: [id number] Date of Birth: 09-19-1945

## 2015-05-21 ENCOUNTER — Ambulatory Visit: Payer: PRIVATE HEALTH INSURANCE

## 2015-05-21 ENCOUNTER — Ambulatory Visit: Payer: Medicare Other | Admitting: Physical Therapy

## 2015-05-21 DIAGNOSIS — R2681 Unsteadiness on feet: Secondary | ICD-10-CM | POA: Diagnosis not present

## 2015-05-22 NOTE — Therapy (Signed)
[place] MAIN [facility] SERVICES [address] [place], [place], [address] Phone: [phone]   Fax:  [phone]  Physical Therapy Treatment  Patient Details  Name: Laura [name] MRN: [id number] Date of Birth: 1945/02/25 Referring Provider: Candiss Norse  Encounter Date: 05/21/2015      PT End of Session - 05/22/15 1827    Visit Number 11   Number of Visits 17   Date for PT Re-Evaluation 06/10/15   Authorization Type 2/10   PT Start Time 1050   PT Stop Time 1130   PT Time Calculation (min) 40 min   Equipment Utilized During Treatment Gait belt   Activity Tolerance Patient limited by fatigue;Patient limited by pain   Behavior During Therapy [facility] for tasks assessed/performed      Past Medical History  Diagnosis Date  . Anxiety   . Depression   . Chronic kidney disease   . Thyroid disease   . Allergy   . Sleep apnea   . Arthritis, degenerative 10/08/2013    Overview:    a.  Lumbar spine/spinal stenosis/foot drop.   b.  Hands.     Past Surgical History  Procedure Laterality Date  . Abdominal hysterectomy    . Cesarean section    . Replacement total knee Left   . Hip surgery Right     There were no vitals filed for this visit.      Subjective Assessment - 05/22/15 1825    Subjective Pt reported sciatic pain today which started yesterday. Exercises she performed at her land PT session helped to reduce it bu it returns with standing/ walking.    Patient is accompained by: Family member  [name] husband   Pertinent History extensive stenosis, L knee TKA, hip OA   How long can you stand comfortably? 3 min   Patient Stated Goals Improve balance, be able to exercise with less pain    Pain Onset More than a month ago     O:  Pt entered/exited the pool via ramp with BUE support on rail, sidestepping Exercises performed in 3'6" depth  50 ft =1 lap   3 Rest breaks with descend along ramp, cued with feet under ankles  Seated kicks ,  knee extended 3' Seated hip abduction/ adduction 3' Sit to stand with cuing for more anterior COM and onto ballmounds 10 x 3 reps  Pt requested a noodle at her waist after 5 reps to due to fear Seated UE chest rows 3"     3 Rest breaks with ascending along ramp, cued with feet under ankles    Stand to chair with less cuing.            Adult Aquatic Therapy - 05/22/15 1826    Aquatic Therapy Subjective   Subjective Pt reported sciatic pain only upon walking up the ramp after the session                         PT Long Term Goals - 05/13/15 1245    PT LONG TERM GOAL #1   Title pt will be able to stand with light UE support x 5 min to assist with meal prep   Time 8   Period Weeks   Status Partially Met   PT LONG TERM GOAL #2   Title pt will perform 5 x sit to stand in <15s to demo improved LE strength    Time 8   Period Weeks  Status Partially Met   PT LONG TERM GOAL #3   Title pt will improve 52mwalk to 0.843m to normalize home mobility    Time 8   Period Weeks   Status Partially Met   PT LONG TERM GOAL #4   Title pt will imrpove berg balance test by 6 pts to reduce fall risk    Time 8   Period Weeks   Status Partially Met   PT LONG TERM GOAL #5   Title pt will improve 41m23mlk distance by 150f2f improve endurance and community mobilty such as Dr. aptsBobbye Riggs Time 8   Period Weeks   Status New               Plan - 05/22/15 1827    Clinical Impression Statement Pt had not sciatic pain during exercises in the pool that were performed in 3'6" depth. Most of today's session was performed seated or focused on traction of the spine with sit to stand repetitions after which pt reported decreased sciatic Sx. Pt showed improved stand <> chair transfers with good carry over. Pt will continue to benefit from skilled PT.     Rehab Potential Good   Clinical Impairments Affecting Rehab Potential co-morbidities    PT Frequency 2x / week   PT Duration 8  weeks   PT Treatment/Interventions ADLs/Self Care Home Management;Aquatic Therapy;Patient/family education;Neuromuscular re-education;Balance training;Therapeutic exercise;Therapeutic activities;Functional mobility training;Gait training;Manual techniques;Passive range of motion   PT Next Visit Plan Continue strengthening and balance, progress HEP as appropriate   PT Home Exercise Plan Seated LAQ with theraband, standing hip abduction, standing marches (to improve SLS balance), sit to stand without UE support   Consulted and Agree with Plan of Care Patient      Patient will benefit from skilled therapeutic intervention in order to improve the following deficits and impairments:  Decreased strength, Pain, Decreased mobility, Difficulty walking, Decreased endurance, Decreased balance, Decreased activity tolerance  Visit Diagnosis: Unsteadiness on feet     Problem List Patient Active Problem List   Diagnosis Date Noted  . Chronic hip pain (Left) 04/06/2015  . History of total knee replacement (Left) 02/09/2015  . History of femur fracture (Right) 02/09/2015  . 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 Lumbar foraminal stenosis (Bilateral L3-4 and L5-S1) 02/09/2015  . Chronic low back pain (Location of Primary Source of Pain) (Bilateral) (L>R) 01/22/2015  . Chronic pain syndrome 11/25/2014  . Opioid dependence (HCC)[place]/28/2016  . Lumbar spinal stenosis (5 mm Severe L3-4; 8 mm L4-5) 11/07/2014  . Lumbar spondylosis 11/07/2014  . Lumbar facet syndrome (Location of Primary Source of Pain) (Bilateral) (L>R) 11/07/2014  . 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 Chronic pain 11/05/2014  . Chronic knee pain (Right) 11/05/2014  . Osteoarthritis of knee (Right) 11/05/2014  . Opiate use (30 MME/Day) 11/05/2014  . Long term current use of opiate analgesic 11/05/2014  . Long term prescription opiate  use 11/05/2014  . Encounter for therapeutic drug level monitoring 11/05/2014  . Morbid obesity (HCC)[place]/17/2016  . Diabetes mellitus, type 2 (HCC)[place]/27/2016  . Extreme obesity (HCC)[place]/27/2016  . Seizure (HCC)[place]/27/2016  . Gastro-esophageal reflux disease without esophagitis 06/24/2014  . Memory loss, short term 03/19/2014  . Type 2 diabetes mellitus (HCC)[place]/09/2014  . Acquired hypothyroidism 01/14/2014  . Clinical depression 10/14/2013  . Essential (primary) hypertension 10/14/2013  . Anxiety 07/09/2013  .  Cannot sleep 07/09/2013  . Acid reflux 01/18/2013  . Adult hypothyroidism 01/18/2013  . HLD (hyperlipidemia) 01/18/2013  . Rheumatoid arthritis without elevated rheumatoid factor ([place]) 01/18/2013  . Seronegative rheumatoid arthritis ([place]) 01/18/2013  . Chronic kidney disease (CKD), stage III (moderate) 01/17/2013  . Congestive heart failure with left ventricular systolic dysfunction ([place]) 11/10/2012    Jerl Mina ,PT, DPT, E-RYT   05/22/2015, 6:30 PM  [place] MAIN [facility] SERVICES [address] [place], [place], [address] Phone: [phone]   Fax:  [phone]  Name: Laura [name] MRN: [id number] Date of Birth: [birth year]

## 2015-05-26 ENCOUNTER — Ambulatory Visit: Payer: Medicare Other

## 2015-05-28 ENCOUNTER — Ambulatory Visit: Payer: Medicare Other | Admitting: Physical Therapy

## 2015-05-28 ENCOUNTER — Ambulatory Visit: Payer: PRIVATE HEALTH INSURANCE

## 2015-06-02 ENCOUNTER — Ambulatory Visit: Payer: Medicare Other

## 2015-06-02 DIAGNOSIS — R2681 Unsteadiness on feet: Secondary | ICD-10-CM

## 2015-06-02 NOTE — Therapy (Signed)
[place] MAIN [facility] SERVICES [address] [place], [place], [address] Phone: [phone]   Fax:  [phone]  Physical Therapy Treatment  Patient Details  Name: Laura [name] MRN: [id number] Date of Birth: 25-May-1945 Referring Provider: Candiss Norse  Encounter Date: 06/02/2015      PT End of Session - 06/02/15 1512    Visit Number 12   Number of Visits 17   Date for PT Re-Evaluation 06/10/15   Authorization Type 3/10   PT Start Time 3546   PT Stop Time 1345   PT Time Calculation (min) 40 min   Equipment Utilized During Treatment Gait belt   Activity Tolerance Patient limited by fatigue   Behavior During Therapy [facility] for tasks assessed/performed      Past Medical History  Diagnosis Date  . Anxiety   . Depression   . Chronic kidney disease   . Thyroid disease   . Allergy   . Sleep apnea   . Arthritis, degenerative 10/08/2013    Overview:    a.  Lumbar spine/spinal stenosis/foot drop.   b.  Hands.     Past Surgical History  Procedure Laterality Date  . Abdominal hysterectomy    . Cesarean section    . Replacement total knee Left   . Hip surgery Right     There were no vitals filed for this visit.      Subjective Assessment - 06/02/15 1508    Subjective pt reports she has been quite ill with bronchitis and has been unable to exercise much due to difficulty breathing. she has been feeling better the past 4 days.    Patient is accompained by: Family member  [name] husband   Pertinent History extensive stenosis, L knee TKA, hip OA   How long can you stand comfortably? 3 min   Patient Stated Goals Improve balance, be able to exercise with less pain    Currently in Pain? Yes   Pain Score 3    Pain Location Back   Pain Orientation Lower   Pain Onset More than a month ago        Nustep L 3 x 5 min with cues to increase SPM >60 Fwd/retro walking with finger tip touch x 2 laps in // bars Side stepping in // bars with finger  tip touch x 2 laps Fwd step up onto 6in step 2x10 each leg- with UE support - cues to reduce WB through UEs LAQ with 3lb ankle weights x 20 each leg Standing march with 3lb ankle weights 30s x 2  pt requires CGA for safety on balance exercises                                PT Long Term Goals - 05/13/15 1245    PT LONG TERM GOAL #1   Title pt will be able to stand with light UE support x 5 min to assist with meal prep   Time 8   Period Weeks   Status Partially Met   PT LONG TERM GOAL #2   Title pt will perform 5 x sit to stand in <15s to demo improved LE strength    Time 8   Period Weeks   Status Partially Met   PT LONG TERM GOAL #3   Title pt will improve 53mwalk to 0.852m to normalize home mobility    Time 8   Period Weeks  Status Partially Met   PT LONG TERM GOAL #4   Title pt will imrpove berg balance test by 6 pts to reduce fall risk    Time 8   Period Weeks   Status Partially Met   PT LONG TERM GOAL #5   Title pt will improve 6m walk distance by 150ft to improve endurance and community mobilty such as Dr. apts.    Time 8   Period Weeks   Status New               Plan - 06/02/15 1512    Clinical Impression Statement pt had more difficulty with endurane today likley due to her recent respiratory infection. O2 saturation was good however. pt was asked to do some exercises without UE , to which she replied "I cant" modified to finger touch for her comfort, however she likely can do some standing exercise without UE support.    Rehab Potential Good   Clinical Impairments Affecting Rehab Potential co-morbidities    PT Frequency 2x / week   PT Duration 8 weeks   PT Treatment/Interventions ADLs/Self Care Home Management;Aquatic Therapy;Patient/family education;Neuromuscular re-education;Balance training;Therapeutic exercise;Therapeutic activities;Functional mobility training;Gait training;Manual techniques;Passive range of motion   PT Next  Visit Plan Continue strengthening and balance, progress HEP as appropriate   PT Home Exercise Plan Seated LAQ with theraband, standing hip abduction, standing marches (to improve SLS balance), sit to stand without UE support   Consulted and Agree with Plan of Care Patient      Patient will benefit from skilled therapeutic intervention in order to improve the following deficits and impairments:  Decreased strength, Pain, Decreased mobility, Difficulty walking, Decreased endurance, Decreased balance, Decreased activity tolerance  Visit Diagnosis: Unsteadiness on feet     Problem List Patient Active Problem List   Diagnosis Date Noted  . Chronic hip pain (Left) 04/06/2015  . History of total knee replacement (Left) 02/09/2015  . History of femur fracture (Right) 02/09/2015  . 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 Lumbar foraminal stenosis (Bilateral L3-4 and L5-S1) 02/09/2015  . Chronic low back pain (Location of Primary Source of Pain) (Bilateral) (L>R) 01/22/2015  . Chronic pain syndrome 11/25/2014  . Opioid dependence (HCC) 11/07/2014  . Lumbar spinal stenosis (5 mm Severe L3-4; 8 mm L4-5) 11/07/2014  . Lumbar spondylosis 11/07/2014  . Lumbar facet syndrome (Location of Primary Source of Pain) (Bilateral) (L>R) 11/07/2014  . 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 Chronic pain 11/05/2014  . Chronic knee pain (Right) 11/05/2014  . Osteoarthritis of knee (Right) 11/05/2014  . Opiate use (30 MME/Day) 11/05/2014  . Long term current use of opiate analgesic 11/05/2014  . Long term prescription opiate use 11/05/2014  . Encounter for therapeutic drug level monitoring 11/05/2014  . Morbid obesity (HCC) 10/27/2014  . Diabetes mellitus, type 2 (HCC) 10/07/2014  . Extreme obesity (HCC) 10/07/2014  . Seizure (HCC) 10/07/2014  . Gastro-esophageal reflux disease without esophagitis  06/24/2014  . Memory loss, short term 03/19/2014  . Type 2 diabetes mellitus (HCC) 03/19/2014  . Acquired hypothyroidism 01/14/2014  . Clinical depression 10/14/2013  . Essential (primary) hypertension 10/14/2013  . Anxiety 07/09/2013  . Cannot sleep 07/09/2013  . Acid reflux 01/18/2013  . Adult hypothyroidism 01/18/2013  . HLD (hyperlipidemia) 01/18/2013  . Rheumatoid arthritis without elevated rheumatoid factor (HCC) 01/18/2013  . 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 Chronic kidney disease (CKD), stage III (  moderate) 01/17/2013  . Congestive heart failure with left ventricular systolic dysfunction ([place]) 11/10/2012    Casen Pryor 06/02/2015, 3:17 PM  [place] MAIN [facility] SERVICES [address] [place], [place], [address] Phone: [phone]   Fax:  [phone]  Name: Dalexa Gentz MRN: [id number] Date of Birth: 1945/02/02

## 2015-06-04 ENCOUNTER — Ambulatory Visit: Payer: Medicare Other | Admitting: Physical Therapy

## 2015-06-04 ENCOUNTER — Ambulatory Visit: Payer: PRIVATE HEALTH INSURANCE

## 2015-06-04 DIAGNOSIS — R2681 Unsteadiness on feet: Secondary | ICD-10-CM

## 2015-06-04 NOTE — Therapy (Signed)
[place] MAIN [facility] SERVICES [address] [place], [place], [address] Phone: [phone]   Fax:  [phone]  Physical Therapy Treatment  Patient Details  Name: Laura [name] MRN: [id number] Date of Birth: Nov 04, 1945 Referring Provider: Candiss Norse  Encounter Date: 06/04/2015      PT End of Session - 06/04/15 1505    Visit Number 13   Number of Visits 17   Date for PT Re-Evaluation 06/10/15   Authorization Type 4/10   PT Start Time 1115   PT Stop Time 1145   PT Time Calculation (min) 30 min   Equipment Utilized During Treatment Gait belt   Activity Tolerance Patient limited by fatigue   Behavior During Therapy [facility] for tasks assessed/performed      Past Medical History  Diagnosis Date  . Anxiety   . Depression   . Chronic kidney disease   . Thyroid disease   . Allergy   . Sleep apnea   . Arthritis, degenerative 10/08/2013    Overview:    a.  Lumbar spine/spinal stenosis/foot drop.   b.  Hands.     Past Surgical History  Procedure Laterality Date  . Abdominal hysterectomy    . Cesarean section    . Replacement total knee Left   . Hip surgery Right     There were no vitals filed for this visit.      Subjective Assessment - 06/04/15 1503    Subjective Pt has recovered from bronchitis from last week.    Patient is accompained by: Family member  [name] husband   Pertinent History extensive stenosis, L knee TKA, hip OA   How long can you stand comfortably? 3 min   Patient Stated Goals Improve balance, be able to exercise with less pain    Pain Onset More than a month ago                     Adult Aquatic Therapy - 06/04/15 1504    Aquatic Therapy Subjective   Subjective Pt reported feeling fearful walking without support on a noodle or wall.         O: Pt entered  the pool via ramp sidestepping with B UE support on rail, exited via ramp with forward walking halfway up the ramp and then sidestepping with  BUE support on rail.  Exercises performed in 3'6" depth  50 ft =1 lap   Standing: Hip ext 10 reps B w/ single UE on wall Seated lat pull ups 15 reps stationary Seated lat pull ups/ scooting with breathing coordination 15 ft x 2 L /R directions Seated hip abd/ add    BUE on noodles with PT: Modified grapevine L/ R 2 laps Pulling and pushing with sidestepping 5'  Stretching : Seated sideflexion L/R 30 sec x 2   Relaxation: Seated with noodles under neck                   PT Long Term Goals - 05/13/15 1245    PT LONG TERM GOAL #1   Title pt will be able to stand with light UE support x 5 min to assist with meal prep   Time 8   Period Weeks   Status Partially Met   PT LONG TERM GOAL #2   Title pt will perform 5 x sit to stand in <15s to demo improved LE strength    Time 8   Period Weeks   Status Partially Met  PT LONG TERM GOAL #3   Title pt will improve 63mwalk to 0.833m to normalize home mobility    Time 8   Period Weeks   Status Partially Met   PT LONG TERM GOAL #4   Title pt will imrpove berg balance test by 6 pts to reduce fall risk    Time 8   Period Weeks   Status Partially Met   PT LONG TERM GOAL #5   Title pt will improve 20m110mlk distance by [id number] improve endurance and community mobilty such as Dr. aptsBobbye Riggs Time 8   Period Weeks   Status New               Plan - 06/04/15 1510    Clinical Impression Statement Pt is progressing well  towards dynamic balance exercises including modified grapevine and sidestepping in a circular dance pattern with double UE support on noodle. Pt appears motivated with dance movements. Pt was able to walk up the ramp halfway with BUE on both ramp rails in a forward pattern instead of relying on side stepping pattern with BUE on single rail.  Pt continues to show progress towards her goals with skilled PT.     Rehab Potential Good   Clinical Impairments Affecting Rehab Potential co-morbidities    PT  Frequency 2x / week   PT Duration 8 weeks   PT Treatment/Interventions ADLs/Self Care Home Management;Aquatic Therapy;Patient/family education;Neuromuscular re-education;Balance training;Therapeutic exercise;Therapeutic activities;Functional mobility training;Gait training;Manual techniques;Passive range of motion   PT Next Visit Plan Continue strengthening and balance, progress HEP as appropriate   PT Home Exercise Plan Seated LAQ with theraband, standing hip abduction, standing marches (to improve SLS balance), sit to stand without UE support   Consulted and Agree with Plan of Care Patient      Patient will benefit from skilled therapeutic intervention in order to improve the following deficits and impairments:  Decreased strength, Pain, Decreased mobility, Difficulty walking, Decreased endurance, Decreased balance, Decreased activity tolerance  Visit Diagnosis: Unsteadiness on feet     Problem List Patient Active Problem List   Diagnosis Date Noted  . Chronic hip pain (Left) 04/06/2015  . History of total knee replacement (Left) 02/09/2015  . History of femur fracture (Right) 02/09/2015  . 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 Lumbar foraminal stenosis (Bilateral L3-4 and L5-S1) 02/09/2015  . Chronic low back pain (Location of Primary Source of Pain) (Bilateral) (L>R) 01/22/2015  . Chronic pain syndrome 11/25/2014  . Opioid dependence (HCC)[place]/28/2016  . Lumbar spinal stenosis (5 mm Severe L3-4; 8 mm L4-5) 11/07/2014  . Lumbar spondylosis 11/07/2014  . Lumbar facet syndrome (Location of Primary Source of Pain) (Bilateral) (L>R) 11/07/2014  . 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 Chronic pain 11/05/2014  . Chronic knee pain (Right) 11/05/2014  . Osteoarthritis of knee (Right) 11/05/2014  . Opiate use (30 MME/Day) 11/05/2014  . Long term current use of opiate analgesic  11/05/2014  . Long term prescription opiate use 11/05/2014  . Encounter for therapeutic drug level monitoring 11/05/2014  . Morbid obesity (HCC)[place]/17/2016  . Diabetes mellitus, type 2 (HCC)[place]/27/2016  . Extreme obesity (HCC)[address]/27/2016  . Seizure (HCC)[place]/27/2016  . Gastro-esophageal reflux disease without esophagitis 06/24/2014  . Memory loss, short term 03/19/2014  . Type 2 diabetes mellitus (HCC)[place]/09/2014  . Acquired hypothyroidism 01/14/2014  . Clinical depression 10/14/2013  . Essential (  primary) hypertension 10/14/2013  . Anxiety 07/09/2013  . Cannot sleep 07/09/2013  . Acid reflux 01/18/2013  . Adult hypothyroidism 01/18/2013  . HLD (hyperlipidemia) 01/18/2013  . Rheumatoid arthritis without elevated rheumatoid factor ([place]) 01/18/2013  . Seronegative rheumatoid arthritis ([place]) 01/18/2013  . Chronic kidney disease (CKD), stage III (moderate) 01/17/2013  . Congestive heart failure with left ventricular systolic dysfunction ([place]) 11/10/2012    Jerl Mina ,PT, DPT, E-RYT  06/04/2015, 3:11 PM  [place] MAIN [facility] SERVICES [address] [place], [place], [address] Phone: [phone]   Fax:  [phone]  Name: Laura [name] MRN: [id number] Date of Birth: Feb 20, 1945

## 2015-06-09 ENCOUNTER — Ambulatory Visit: Payer: PRIVATE HEALTH INSURANCE

## 2015-06-09 ENCOUNTER — Ambulatory Visit: Payer: Medicare Other

## 2015-06-09 DIAGNOSIS — R2681 Unsteadiness on feet: Secondary | ICD-10-CM | POA: Diagnosis not present

## 2015-06-09 NOTE — Therapy (Signed)
[place] MAIN [facility] SERVICES [address] [place], [place], [address] Phone: [phone]   Fax:  [phone]  Physical Therapy Treatment/ progress note 05/13/15-06/09/15  Patient Details  Name: Laura [name] MRN: [id number] Date of Birth: Oct 19, 1945 Referring Provider: Candiss Norse  Encounter Date: 06/09/2015      PT End of Session - 06/09/15 1450    Visit Number 14   Number of Visits 25   Date for PT Re-Evaluation 07/07/15   Authorization Type 1/10   PT Start Time 1300   PT Stop Time 1345   PT Time Calculation (min) 45 min   Equipment Utilized During Treatment Gait belt   Activity Tolerance Patient limited by fatigue;Patient limited by pain   Behavior During Therapy [facility] for tasks assessed/performed      Past Medical History  Diagnosis Date  . Anxiety   . Depression   . Chronic kidney disease   . Thyroid disease   . Allergy   . Sleep apnea   . Arthritis, degenerative 10/08/2013    Overview:    a.  Lumbar spine/spinal stenosis/foot drop.   b.  Hands.     Past Surgical History  Procedure Laterality Date  . Abdominal hysterectomy    . Cesarean section    . Replacement total knee Left   . Hip surgery Right     There were no vitals filed for this visit.      Subjective Assessment - 06/09/15 1448    Subjective pt reports she feels like she has made improvements, but her illness has set her back. she reports she has not looked into [employer] for following PT yet.    Patient is accompained by: Family member  Laura [name] husband   Pertinent History extensive stenosis, L knee TKA, hip OA   How long can you stand comfortably? 3 min   Patient Stated Goals Improve balance, be able to exercise with less pain    Currently in Pain? Yes   Pain Score 6    Pain Location Knee   Pain Orientation Right   Pain Descriptors / Indicators Aching   Pain Type Chronic pain   Pain Onset More than a month ago       Therex: PT reassessed outcome  measures and progress towards goals as follows"     [facility] PT Assessment - 06/09/15 0001    Standardized Balance Assessment   Five times sit to stand comments  17.0   10 Meter Walk 0.5ms   Berg Balance Test   Sit to Stand Able to stand  independently using hands   Standing Unsupported Able to stand 30 seconds unsupported   Sitting with Back Unsupported but Feet Supported on Floor or Stool Able to sit safely and securely 2 minutes   Stand to Sit Controls descent by using hands   Transfers Able to transfer safely, definite need of hands   Standing Unsupported with Eyes Closed Able to stand 3 seconds   Standing Ubsupported with Feet Together Able to place feet together independently but unable to hold for 30 seconds   From Standing, Reach Forward with Outstretched Arm Can reach forward >5 cm safely (2")   From Standing Position, Pick up Object from Floor Unable to pick up and needs supervision   From Standing Position, Turn to Look Behind Over each Shoulder Needs supervision when turning   Turn 360 Degrees Needs assistance while turning   Standing Unsupported, Alternately Place Feet on Step/Stool Needs assistance to keep from  falling or unable to try   Standing Unsupported, One Foot in [place] help to step but can hold 15 seconds   Standing on One Leg Tries to lift leg/unable to hold 3 seconds but remains standing independently   Total Score 25    6 min walk: 466f                         PT Education - 006/05/20171449    Education provided Yes   Education Details progress towards goals, PT POC, discharge planning for exercise following PT   Person(s) Educated Patient   Methods Explanation   Comprehension Verbalized understanding             PT Long Term Goals - [id number]    PT LONG TERM GOAL #1   Title pt will be able to stand with light UE support x 5 min to assist with meal prep   Baseline 350m 50s min 5/06-05-24 Time 8   Period Weeks   Status  Partially Met   PT LONG TERM GOAL #2   Title pt will perform 5 x sit to stand in <15s to demo improved LE strength    Baseline 17s   Time 8   Period Weeks   Status Partially Met   PT LONG TERM GOAL #3   Title pt will improve 1061mlk to 0.77m/560mo normalize home mobility    Baseline 0.60m/s67mTime 8   Period Weeks   Status Partially Met   PT LONG TERM GOAL #4   Title pt will imrpove berg balance test by 6 pts to reduce fall risk    Time 8   Period Weeks   Status Achieved   PT LONG TERM GOAL #5   Title pt will improve 60m wa25mdistance by 150ft t60fprove endurance and community mobilty such as Dr. apts.  Bobbye Riggsseline + 95ft 5/61f4006/05/2022 495) 2 rest breaks 5/3, 0 rest 5/30   TJune 05, 20248   Period Weeks   Status Partially Met               Plan - 05/30/172017-06-05 Clinical Impression Statement pt has made progress towards goals since last recheck. her gait speed has not increased, however her endurance and balance have improved. pt has partially met goals at this time and would benefit from continued skilled PT services to reduce fall risk and maximzie funciton.    Rehab Potential Good   Clinical Impairments Affecting Rehab Potential co-morbidities    PT Frequency 2x / week   PT Duration 8 weeks   PT Treatment/Interventions ADLs/Self Care Home Management;Aquatic Therapy;Patient/family education;Neuromuscular re-education;Balance training;Therapeutic exercise;Therapeutic activities;Functional mobility training;Gait training;Manual techniques;Passive range of motion   PT Next Visit Plan Continue strengthening and balance, progress HEP as appropriate   PT Home Exercise Plan Seated LAQ with theraband, standing hip abduction, standing marches (to improve SLS balance), sit to stand without UE support   Consulted and Agree with Plan of Care Patient      Patient will benefit from skilled therapeutic intervention in order to improve the following deficits and impairments:  Decreased strength,  Pain, Decreased mobility, Difficulty walking, Decreased endurance, Decreased balance, Decreased activity tolerance  Visit Diagnosis: Unsteadiness on feet - Plan: PT plan of care cert/re-cert       G-Codes - 06/08/1704-05-2015 Functional Assessment Tool Used berg/10mwalk/677mwalk   Functional Limitation  Mobility: Walking and moving around   Mobility: Walking and Moving Around Current Status [phone]) At least 40 percent but less than 60 percent impaired, limited or restricted   Mobility: Walking and Moving Around Goal Status [phone]) At least 20 percent but less than 40 percent impaired, limited or restricted      Problem List Patient Active Problem List   Diagnosis Date Noted  . Chronic hip pain (Left) 04/06/2015  . History of total knee replacement (Left) 02/09/2015  . History of femur fracture (Right) 02/09/2015  . 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 Lumbar foraminal stenosis (Bilateral L3-4 and L5-S1) 02/09/2015  . Chronic low back pain (Location of Primary Source of Pain) (Bilateral) (L>R) 01/22/2015  . Chronic pain syndrome 11/25/2014  . Opioid dependence ([place]) 11/07/2014  . Lumbar spinal stenosis (5 mm Severe L3-4; 8 mm L4-5) 11/07/2014  . Lumbar spondylosis 11/07/2014  . Lumbar facet syndrome (Location of Primary Source of Pain) (Bilateral) (L>R) 11/07/2014  . 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 Chronic pain 11/05/2014  . Chronic knee pain (Right) 11/05/2014  . Osteoarthritis of knee (Right) 11/05/2014  . Opiate use (30 MME/Day) 11/05/2014  . Long term current use of opiate analgesic 11/05/2014  . Long term prescription opiate use 11/05/2014  . Encounter for therapeutic drug level monitoring 11/05/2014  . Morbid obesity ([place]) 10/27/2014  . Diabetes mellitus, type 2 ([place]) 10/07/2014  . Extreme obesity ([place]) 10/07/2014  . Seizure ([place]) 10/07/2014  . Gastro-esophageal  reflux disease without esophagitis 06/24/2014  . Memory loss, short term 03/19/2014  . Type 2 diabetes mellitus ([place]) 03/19/2014  . Acquired hypothyroidism 01/14/2014  . Clinical depression 10/14/2013  . Essential (primary) hypertension 10/14/2013  . Anxiety 07/09/2013  . Cannot sleep 07/09/2013  . Acid reflux 01/18/2013  . Adult hypothyroidism 01/18/2013  . HLD (hyperlipidemia) 01/18/2013  . Rheumatoid arthritis without elevated rheumatoid factor ([place]) 01/18/2013  . Seronegative rheumatoid arthritis ([place]) 01/18/2013  . Chronic kidney disease (CKD), stage III (moderate) 01/17/2013  . Congestive heart failure with left ventricular systolic dysfunction ([place]) 11/10/2012   Caryl Pina C. Leyland Kenna, PT, DPT [phone]  Laura [name] 06/09/2015, 2:54 PM  [place] MAIN [facility] SERVICES [address] [place], [place], [address] Phone: [phone]   Fax:  [phone]  Name: Laura [name] MRN: [id number] Date of Birth: May 20, 1945

## 2015-06-11 ENCOUNTER — Ambulatory Visit: Payer: Medicare Other | Attending: Internal Medicine | Admitting: Physical Therapy

## 2015-06-11 ENCOUNTER — Ambulatory Visit: Payer: PRIVATE HEALTH INSURANCE

## 2015-06-11 DIAGNOSIS — R2681 Unsteadiness on feet: Secondary | ICD-10-CM | POA: Diagnosis present

## 2015-06-12 NOTE — Therapy (Signed)
[place] MAIN [facility] SERVICES [address] [place], [place], [address] Phone: [phone]   Fax:  [phone]  Physical Therapy Treatment  Patient Details  Name: Laura [name] MRN: [id number] Date of Birth: 08-Oct-1945 Referring Provider: Candiss Norse  Encounter Date: 06/11/2015      PT End of Session - 06/12/15 0814    Visit Number 15   Number of Visits 25   Date for PT Re-Evaluation 07/07/15   Authorization Type 2/10   PT Start Time 1117   PT Stop Time 1150   PT Time Calculation (min) 33 min   Equipment Utilized During Treatment Gait belt   Activity Tolerance Patient limited by fatigue;Patient limited by pain   Behavior During Therapy [facility] for tasks assessed/performed      Past Medical History  Diagnosis Date  . Anxiety   . Depression   . Chronic kidney disease   . Thyroid disease   . Allergy   . Sleep apnea   . Arthritis, degenerative 10/08/2013    Overview:    a.  Lumbar spine/spinal stenosis/foot drop.   b.  Hands.     Past Surgical History  Procedure Laterality Date  . Abdominal hysterectomy    . Cesarean section    . Replacement total knee Left   . Hip surgery Right     There were no vitals filed for this visit.      Subjective Assessment - 06/12/15 0813    Subjective Pt reported she cotniues to feel better after her sickness.    Patient is accompained by: Family member  [name] husband   Pertinent History extensive stenosis, L knee TKA, hip OA   How long can you stand comfortably? 3 min   Patient Stated Goals Improve balance, be able to exercise with less pain    Pain Onset More than a month ago                     Adult Aquatic Therapy - 06/12/15 0814    Aquatic Therapy Subjective   Subjective Pt reported feeling fearful walking without support on a noodle or wall.        O: Pt entered/exited the pool via ramp with UE support on rail. (sidestepping down and sidestepping half way, forward walking  up the ramp other half of the way with BUE (PT providing CGA) Exercises performed in 3'6" depth  50 ft =1 lap   2 laps forward walking with no cuing for foot propioception Hip 3-way 10 reps each side with UE support on wall  Mini squats 10 reps  2 laps walking with dumbbells in hands   Exercises in 4'6"depth 2 laps [place] L/R   30 step /downs with BUE support for each LE anterior COM to prevent posterior lean and LOB)    Seated relaxation 5'                  PT Long Term Goals - 06/09/15 1451    PT LONG TERM GOAL #1   Title pt will be able to stand with light UE support x 5 min to assist with meal prep   Baseline 66mn 50s min 5/30   Time 8   Period Weeks   Status Partially Met   PT LONG TERM GOAL #2   Title pt will perform 5 x sit to stand in <15s to demo improved LE strength    Baseline 17s   Time 8   Period Weeks  Status Partially Met   PT LONG TERM GOAL #3   Title pt will improve 15mwalk to 0.815m to normalize home mobility    Baseline 0.15m11m  Time 8   Period Weeks   Status Partially Met   PT LONG TERM GOAL #4   Title pt will imrpove berg balance test by 6 pts to reduce fall risk    Time 8   Period Weeks   Status Achieved   PT LONG TERM GOAL #5   Title pt will improve 15m 40mk distance by 150ft70fimprove endurance and community mobilty such as Dr. apts.Bobbye RiggsBaseline + 95ft 79f (400 vs 495) 2 rest breaks 5/3, 0 rest 5/30   Time 8   Period Weeks   Status Partially Met               Plan - 06/12/15 0814    Clinical Impression Statement Pt showed increased endurance as she required less seated rest breaks this week. Pt demo'd postural stability with step-up/down exercises with BUE support and continues to progress well with dynamic stability exercises. Initiated exercises in 4'6" depth.  Pt initially showed slight fear but was able to complete the rest of the session as long as she had BUE support.   Pt showed less forward flexion when  pushing the rollator. Pt continues to express her motivation to have the physical ability to place her rollator in to her car and to have the IND [facility] she would like to become a volunteer and be a more social environment.  Plan to discuss with pt about community pools for pt to look into upon completing PT. Pt continues to progress well towards her goals and will benefit from skilled PT.     Rehab Potential Good   Clinical Impairments Affecting Rehab Potential co-morbidities    PT Frequency 2x / week   PT Duration 8 weeks   PT Treatment/Interventions ADLs/Self Care Home Management;Aquatic Therapy;Patient/family education;Neuromuscular re-education;Balance training;Therapeutic exercise;Therapeutic activities;Functional mobility training;Gait training;Manual techniques;Passive range of motion   PT Next Visit Plan Continue strengthening and balance, progress HEP as appropriate   PT Home Exercise Plan Seated LAQ with theraband, standing hip abduction, standing marches (to improve SLS balance), sit to stand without UE support   Consulted and Agree with Plan of Care Patient      Patient will benefit from skilled therapeutic intervention in order to improve the following deficits and impairments:  Decreased strength, Pain, Decreased mobility, Difficulty walking, Decreased endurance, Decreased balance, Decreased activity tolerance  Visit Diagnosis: Unsteadiness on feet     Problem List Patient Active Problem List   Diagnosis Date Noted  . Chronic hip pain (Left) 04/06/2015  . History of total knee replacement (Left) 02/09/2015  . History of femur fracture (Right) 02/09/2015  . 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 Lumbar foraminal stenosis (Bilateral L3-4 and L5-S1) 02/09/2015  . Chronic low back pain (Location of Primary Source of Pain) (Bilateral) (L>R) 01/22/2015  . Chronic pain  syndrome 11/25/2014  . Opioid dependence (HCC) 1[place]8/2016  . Lumbar spinal stenosis (5 mm Severe L3-4; 8 mm L4-5) 11/07/2014  . Lumbar spondylosis 11/07/2014  . Lumbar facet syndrome (Location of Primary Source of Pain) (Bilateral) (L>R) 11/07/2014  . 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 Chronic pain 11/05/2014  . Chronic knee pain (Right) 11/05/2014  . Osteoarthritis of  knee (Right) 11/05/2014  . Opiate use (30 MME/Day) 11/05/2014  . Long term current use of opiate analgesic 11/05/2014  . Long term prescription opiate use 11/05/2014  . Encounter for therapeutic drug level monitoring 11/05/2014  . Morbid obesity ([place]) 10/27/2014  . Diabetes mellitus, type 2 ([place]) 10/07/2014  . Extreme obesity ([place]) 10/07/2014  . Seizure ([place]) 10/07/2014  . Gastro-esophageal reflux disease without esophagitis 06/24/2014  . Memory loss, short term 03/19/2014  . Type 2 diabetes mellitus ([place]) 03/19/2014  . Acquired hypothyroidism 01/14/2014  . Clinical depression 10/14/2013  . Essential (primary) hypertension 10/14/2013  . Anxiety 07/09/2013  . Cannot sleep 07/09/2013  . Acid reflux 01/18/2013  . Adult hypothyroidism 01/18/2013  . HLD (hyperlipidemia) 01/18/2013  . Rheumatoid arthritis without elevated rheumatoid factor ([place]) 01/18/2013  . Seronegative rheumatoid arthritis ([place]) 01/18/2013  . Chronic kidney disease (CKD), stage III (moderate) 01/17/2013  . Congestive heart failure with left ventricular systolic dysfunction ([place]) 11/10/2012    Jerl Mina ,PT, DPT, E-RYT  06/12/2015, 8:15 AM  [place] MAIN [facility] SERVICES [address] [place], [place], [address] Phone: [phone]   Fax:  [phone]  Name: Laura [name] MRN: [id number] Date of Birth: [birth year]

## 2015-06-15 ENCOUNTER — Ambulatory Visit: Payer: Medicare Other

## 2015-06-15 ENCOUNTER — Other Ambulatory Visit: Payer: Self-pay

## 2015-06-15 DIAGNOSIS — R2681 Unsteadiness on feet: Secondary | ICD-10-CM | POA: Diagnosis not present

## 2015-06-15 NOTE — Therapy (Signed)
[place] MAIN [facility] SERVICES [address] [place], [place], [address] Phone: [phone]   Fax:  [phone]  Physical Therapy Treatment  Patient Details  Name: Laura [name] MRN: [id number] Date of Birth: 07-Feb-1945 Referring Provider: Candiss Norse  Encounter Date: 06/15/2015      PT End of Session - 06/15/15 1342    Visit Number 16   Number of Visits 25   Date for PT Re-Evaluation 07/07/15   Authorization Type 3/10   PT Start Time 1300   PT Stop Time 1345   PT Time Calculation (min) 45 min   Equipment Utilized During Treatment Gait belt   Activity Tolerance Patient limited by fatigue;Patient limited by pain   Behavior During Therapy [facility] for tasks assessed/performed      Past Medical History  Diagnosis Date  . Anxiety   . Depression   . Chronic kidney disease   . Thyroid disease   . Allergy   . Sleep apnea   . Arthritis, degenerative 10/08/2013    Overview:    a.  Lumbar spine/spinal stenosis/foot drop.   b.  Hands.     Past Surgical History  Procedure Laterality Date  . Abdominal hysterectomy    . Cesarean section    . Replacement total knee Left   . Hip surgery Right     There were no vitals filed for this visit.      Subjective Assessment - 06/15/15 1301    Subjective pt reports her sciatic in the R leg is bad today   Patient is accompained by: Family member  [name] husband   Pertinent History extensive stenosis, L knee TKA, hip OA   How long can you stand comfortably? 3 min   Patient Stated Goals Improve balance, be able to exercise with less pain    Currently in Pain? Yes   Pain Score 5    Pain Location Hip   Pain Orientation Right   Pain Descriptors / Indicators Aching   Pain Onset More than a month ago      Nustep L 3 x 5 min no charge  Fwd/retro walking in// bars with light UE support x 5 laps Fwd/side/retro stepping no UE x 5 each side Fwd step up with BUE support x 12 each side  Standing on [employer] no  Ue support 30s x 6 Repeated forward flexion in sitting to relieve sciatic pain 2 x 10 Small rocker board L/R weight and static balance x 5 min    pt requires CGA for safety on balance exercises    Pt requires min-mod verbal and tactile cues for proper exercise performance                           PT Education - 06/15/15 1342    Education provided Yes   Education Details weight shifting    Person(s) Educated Patient   Methods Explanation   Comprehension Verbalized understanding;Verbal cues required;Tactile cues required             PT Long Term Goals - 06/09/15 1451    PT LONG TERM GOAL #1   Title pt will be able to stand with light UE support x 5 min to assist with meal prep   Baseline 58mn 50s min 5/30   Time 8   Period Weeks   Status Partially Met   PT LONG TERM GOAL #2   Title pt will perform 5 x sit to stand  in <15s to demo improved LE strength    Baseline 17s   Time 8   Period Weeks   Status Partially Met   PT LONG TERM GOAL #3   Title pt will improve 53mwalk to 0.878m to normalize home mobility    Baseline 0.13m15m  Time 8   Period Weeks   Status Partially Met   PT LONG TERM GOAL #4   Title pt will imrpove berg balance test by 6 pts to reduce fall risk    Time 8   Period Weeks   Status Achieved   PT LONG TERM GOAL #5   Title pt will improve 13m 65mk distance by 150ft29fimprove endurance and community mobilty such as Dr. apts.Bobbye RiggsBaseline + 95ft 35f (400 vs 495) 2 rest breaks 5/3, 0 rest 5/30   Time 8   Period Weeks   Status Partially Met               Plan - 06/15/15 1343    Clinical Impression Statement pt did well with progression of balance exercise. she was able to do weight shifting activities / stepping activities without UE support.    Rehab Potential Good   Clinical Impairments Affecting Rehab Potential co-morbidities    PT Frequency 2x / week   PT Duration 8 weeks   PT Treatment/Interventions ADLs/Self Care Home  Management;Aquatic Therapy;Patient/family education;Neuromuscular re-education;Balance training;Therapeutic exercise;Therapeutic activities;Functional mobility training;Gait training;Manual techniques;Passive range of motion   PT Next Visit Plan Continue strengthening and balance, progress HEP as appropriate   PT Home Exercise Plan Seated LAQ with theraband, standing hip abduction, standing marches (to improve SLS balance), sit to stand without UE support   Consulted and Agree with Plan of Care Patient      Patient will benefit from skilled therapeutic intervention in order to improve the following deficits and impairments:  Decreased strength, Pain, Decreased mobility, Difficulty walking, Decreased endurance, Decreased balance, Decreased activity tolerance  Visit Diagnosis: Unsteadiness on feet     Problem List Patient Active Problem List   Diagnosis Date Noted  . Chronic hip pain (Left) 04/06/2015  . History of total knee replacement (Left) 02/09/2015  . History of femur fracture (Right) 02/09/2015  . 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 Lumbar foraminal stenosis (Bilateral L3-4 and L5-S1) 02/09/2015  . Chronic low back pain (Location of Primary Source of Pain) (Bilateral) (L>R) 01/22/2015  . Chronic pain syndrome 11/25/2014  . Opioid dependence (HCC) 1[place]8/2016  . Lumbar spinal stenosis (5 mm Severe L3-4; 8 mm L4-5) 11/07/2014  . Lumbar spondylosis 11/07/2014  . Lumbar facet syndrome (Location of Primary Source of Pain) (Bilateral) (L>R) 11/07/2014  . 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 Chronic pain 11/05/2014  . Chronic knee pain (Right) 11/05/2014  . Osteoarthritis of knee (Right) 11/05/2014  . Opiate use (30 MME/Day) 11/05/2014  . Long term current use of opiate analgesic 11/05/2014  . Long term prescription opiate use 11/05/2014  . Encounter for therapeutic drug level  monitoring 11/05/2014  . Morbid obesity (HCC) 1[place]7/2016  . Diabetes mellitus, type 2 (HCC) 0[place]7/2016  . Extreme obesity (HCC) 0[place]7/2016  . Seizure (HCC) 0[place]7/2016  . Gastro-esophageal reflux disease without esophagitis 06/24/2014  . Memory loss, short term 03/19/2014  . Type 2 diabetes mellitus (HCC) 0[place]9/2016  . Acquired hypothyroidism 01/14/2014  . Clinical depression 10/14/2013  . Essential (primary) hypertension 10/14/2013  . Anxiety  07/09/2013  . Cannot sleep 07/09/2013  . Acid reflux 01/18/2013  . Adult hypothyroidism 01/18/2013  . HLD (hyperlipidemia) 01/18/2013  . Rheumatoid arthritis without elevated rheumatoid factor ([place]) 01/18/2013  . Seronegative rheumatoid arthritis ([place]) 01/18/2013  . Chronic kidney disease (CKD), stage III (moderate) 01/17/2013  . Congestive heart failure with left ventricular systolic dysfunction ([place]) 11/10/2012   Caryl Pina C. Deone Leifheit, PT, DPT [phone]  Laura [name] 06/15/2015, 1:49 PM  [place] MAIN [facility] SERVICES [address] [place], [place], [address] Phone: [phone]   Fax:  [phone]  Name: Laura [name] MRN: [id number] Date of Birth: 1945/05/16

## 2015-06-15 NOTE — Telephone Encounter (Signed)
Received a fax requesting a refill for the duloxetine

## 2015-06-16 MED ORDER — DULOXETINE HCL 30 MG PO CPEP: ORAL_CAPSULE | ORAL | Status: DC

## 2015-06-18 ENCOUNTER — Ambulatory Visit: Payer: Medicare Other | Admitting: Physical Therapy

## 2015-06-18 DIAGNOSIS — R2681 Unsteadiness on feet: Secondary | ICD-10-CM

## 2015-06-18 NOTE — Therapy (Signed)
[place] MAIN [facility] SERVICES [address] [place], [place], [address] Phone: [phone]   Fax:  [phone]  Physical Therapy Treatment  Patient Details  Name: Laura [name] MRN: [id number] Date of Birth: 1945/11/17 Referring Provider: Candiss Norse  Encounter Date: 06/18/2015    Past Medical History  Diagnosis Date  . Anxiety   . Depression   . Chronic kidney disease   . Thyroid disease   . Allergy   . Sleep apnea   . Arthritis, degenerative 10/08/2013    Overview:    a.  Lumbar spine/spinal stenosis/foot drop.   b.  Hands.     Past Surgical History  Procedure Laterality Date  . Abdominal hysterectomy    . Cesarean section    . Replacement total knee Left   . Hip surgery Right         Pt arrived at 11:20am w/ husband. PT checked in with pt before entering the pool. Pt appeared pale and reported she was not feeling well because she had fasted overnight and went to get bloodwork done this morning.  PT withheld PT, explained to pt pool therapy would not be warranted today, and advised pt to return home.  Pt agreed and voiced understanding. No charges were billed.                                 PT Long Term Goals - 06/09/15 1451    PT LONG TERM GOAL #1   Title pt will be able to stand with light UE support x 5 min to assist with meal prep   Baseline 63mn 50s min 5/30   Time 8   Period Weeks   Status Partially Met   PT LONG TERM GOAL #2   Title pt will perform 5 x sit to stand in <15s to [name] improved LE strength    Baseline 17s   Time 8   Period Weeks   Status Partially Met   PT LONG TERM GOAL #3   Title pt will improve 125malk to 0.23m41mto normalize home mobility    Baseline 0.80m/40m Time 8   Period Weeks   Status Partially Met   PT LONG TERM GOAL #4   Title pt will imrpove berg balance test by 6 pts to reduce fall risk    Time 8   Period Weeks   Status Achieved   PT LONG TERM GOAL #5   Title pt will improve 80m w580m distance by 150ft 26fmprove endurance and community mobilty such as Dr. apts. Bobbye Riggsaseline + 95ft 553f(400 vs 495) 2 rest breaks 5/3, 0 rest 5/30   Time 8   Period Weeks   Status Partially Met             Patient will benefit from skilled therapeutic intervention in order to improve the following deficits and impairments:     Visit Diagnosis: Unsteadiness on feet     Problem List Patient Active Problem List   Diagnosis Date Noted  . Chronic hip pain (Left) 04/06/2015  . History of total knee replacement (Left) 02/09/2015  . History of femur fracture (Right) 02/09/2015  . 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 Lumbar foraminal stenosis (Bilateral L3-4 and L5-S1) 02/09/2015  . Chronic low back pain (Location of Primary Source of Pain) (Bilateral) (L>R) 01/22/2015  .  Chronic pain syndrome 11/25/2014  . Opioid dependence ([place]) 11/07/2014  . Lumbar spinal stenosis (5 mm Severe L3-4; 8 mm L4-5) 11/07/2014  . Lumbar spondylosis 11/07/2014  . Lumbar facet syndrome (Location of Primary Source of Pain) (Bilateral) (L>R) 11/07/2014  . 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 Chronic pain 11/05/2014  . Chronic knee pain (Right) 11/05/2014  . Osteoarthritis of knee (Right) 11/05/2014  . Opiate use (30 MME/Day) 11/05/2014  . Long term current use of opiate analgesic 11/05/2014  . Long term prescription opiate use 11/05/2014  . Encounter for therapeutic drug level monitoring 11/05/2014  . Morbid obesity ([place]) 10/27/2014  . Diabetes mellitus, type 2 ([place]) 10/07/2014  . Extreme obesity ([place]) 10/07/2014  . Seizure ([place]) 10/07/2014  . Gastro-esophageal reflux disease without esophagitis 06/24/2014  . Memory loss, short term 03/19/2014  . Type 2 diabetes mellitus ([place]) 03/19/2014  . Acquired hypothyroidism 01/14/2014  . Clinical depression 10/14/2013  .  Essential (primary) hypertension 10/14/2013  . Anxiety 07/09/2013  . Cannot sleep 07/09/2013  . Acid reflux 01/18/2013  . Adult hypothyroidism 01/18/2013  . HLD (hyperlipidemia) 01/18/2013  . Rheumatoid arthritis without elevated rheumatoid factor ([place]) 01/18/2013  . Seronegative rheumatoid arthritis ([place]) 01/18/2013  . Chronic kidney disease (CKD), stage III (moderate) 01/17/2013  . Congestive heart failure with left ventricular systolic dysfunction ([place]) 11/10/2012    Jerl Mina ,PT, DPT, E-RYT  06/18/2015, 12:21 PM  [place] MAIN [facility] SERVICES [address] [place], [place], [address] Phone: [phone]   Fax:  [phone]  Name: Laura [name] MRN: [id number] Date of Birth: 1946/01/10

## 2015-06-22 ENCOUNTER — Ambulatory Visit: Payer: Medicare Other

## 2015-06-22 DIAGNOSIS — R2681 Unsteadiness on feet: Secondary | ICD-10-CM | POA: Diagnosis not present

## 2015-06-22 NOTE — Therapy (Signed)
[place] MAIN [facility] SERVICES [address] [place], [place], [address] Phone: [phone]   Fax:  [phone]  Physical Therapy Treatment  Patient Details  Name: Laura [name] MRN: [id number] Date of Birth: 1945/01/12 Referring Provider: Candiss Norse  Encounter Date: 06/22/2015      PT End of Session - 06/22/15 1439    Visit Number 17   Number of Visits 25   Date for PT Re-Evaluation 07/07/15   Authorization Type 4/10   PT Start Time 8341   PT Stop Time 1428   PT Time Calculation (min) 43 min   Equipment Utilized During Treatment Gait belt   Activity Tolerance Patient limited by fatigue;Patient limited by pain   Behavior During Therapy [facility] for tasks assessed/performed      Past Medical History  Diagnosis Date  . Anxiety   . Depression   . Chronic kidney disease   . Thyroid disease   . Allergy   . Sleep apnea   . Arthritis, degenerative 10/08/2013    Overview:    a.  Lumbar spine/spinal stenosis/foot drop.   b.  Hands.     Past Surgical History  Procedure Laterality Date  . Abdominal hysterectomy    . Cesarean section    . Replacement total knee Left   . Hip surgery Right     There were no vitals filed for this visit.      Subjective Assessment - 06/22/15 1359    Subjective (p) pt reports she is frustrated because she would like to be more independent IE driving, but she doesnt know if she can get her rollator in the car by herself   Patient is accompained by: (p) Family member  Laura [name] husband   Pertinent History (p) extensive stenosis, L knee TKA, hip OA   How long can you stand comfortably? (p) 3 min   Patient Stated Goals (p) Improve balance, be able to exercise with less pain    Currently in Pain? (p) Yes   Pain Score (p) 2    Pain Location (p) Back   Pain Descriptors / Indicators (p) Aching   Pain Type (p) Chronic pain   Pain Onset (p) More than a month ago       Therex: Sit to stand from elevated mat table 3  x 10 with mod verbal and tactile cues for forward COG, CGA for safety  Fwd lunge on BOSU x 10 each leg : light ue support Side lunge on BOSU x 10 each leg light UE support  Retro stepping no UE x 10 each leg - Pt requires min verbal and tactile cues for proper exercise performance    Thereact: Pt instruction/performance of placing rollator in the trunk of her car. Pt instructed on possibly leaning on the car for balance while she folds and picks up the rollator. Simulated by leaning on the cabinet and placing rollator on top of chairs. Pt performed safely 2 x 4 repetitions, instructed to have her husband with her initially. PT emphasized safety and lifting mechanics. Pt verbalizes understanding.                           PT Education - 06/22/15 1439    Education provided Yes   Education Details pt safety for loading/unloading rollator into the car.    Person(s) Educated Patient   Methods Explanation   Comprehension Verbalized understanding  PT Long Term Goals - 06/09/15 1451    PT LONG TERM GOAL #1   Title pt will be able to stand with light UE support x 5 min to assist with meal prep   Baseline 12mn 50s min 5/30   Time 8   Period Weeks   Status Partially Met   PT LONG TERM GOAL #2   Title pt will perform 5 x sit to stand in <15s to demo improved LE strength    Baseline 17s   Time 8   Period Weeks   Status Partially Met   PT LONG TERM GOAL #3   Title pt will improve 134malk to 0.59m80mto normalize home mobility    Baseline 0.35m/62m Time 8   Period Weeks   Status Partially Met   PT LONG TERM GOAL #4   Title pt will imrpove berg balance test by 6 pts to reduce fall risk    Time 8   Period Weeks   Status Achieved   PT LONG TERM GOAL #5   Title pt will improve 35m w40m distance by 150ft 25fmprove endurance and community mobilty such as Dr. apts. Bobbye Riggsaseline + 95ft 555f(400 vs 495) 2 rest breaks 5/3, 0 rest 5/30   Time 8   Period Weeks    Status Partially Met               Plan - 06/22/15 1440    Clinical Impression Statement pt was somewhat tearful with reports of frustration regarding loss of independence in driving. she expresses her major barrier is loading / unloading the rollator into the car. PT simulated this in clinic today with her standing and leaning on cabinet and folding/ lifting up the rollaor several times. pt was safe in performing this action and did not report pain with this. pt instructed to try this with husband present initially for safety. Otherwise pt did quite well with progression of LE strengthening today. She is still fearful with exercises that she is requested to not hold on. also pt tends to keep COG posteriorly likely due to flexed posture.    Rehab Potential Good   Clinical Impairments Affecting Rehab Potential co-morbidities    PT Frequency 2x / week   PT Duration 8 weeks   PT Treatment/Interventions ADLs/Self Care Home Management;Aquatic Therapy;Patient/family education;Neuromuscular re-education;Balance training;Therapeutic exercise;Therapeutic activities;Functional mobility training;Gait training;Manual techniques;Passive range of motion   PT Next Visit Plan Continue strengthening and balance, progress HEP as appropriate   PT Home Exercise Plan Seated LAQ with theraband, standing hip abduction, standing marches (to improve SLS balance), sit to stand without UE support   Consulted and Agree with Plan of Care Patient      Patient will benefit from skilled therapeutic intervention in order to improve the following deficits and impairments:  Decreased strength, Pain, Decreased mobility, Difficulty walking, Decreased endurance, Decreased balance, Decreased activity tolerance  Visit Diagnosis: Unsteadiness on feet     Problem List Patient Active Problem List   Diagnosis Date Noted  . Chronic hip pain (Left) 04/06/2015  . History of total knee replacement (Left) 02/09/2015  . History  of femur fracture (Right) 02/09/2015  . 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 Lumbar foraminal stenosis (Bilateral L3-4 and L5-S1) 02/09/2015  . Chronic low back pain (Location of Primary Source of Pain) (Bilateral) (L>R) 01/22/2015  . Chronic pain syndrome 11/25/2014  . Opioid dependence (  [place]) 11/07/2014  . Lumbar spinal stenosis (5 mm Severe L3-4; 8 mm L4-5) 11/07/2014  . Lumbar spondylosis 11/07/2014  . Lumbar facet syndrome (Location of Primary Source of Pain) (Bilateral) (L>R) 11/07/2014  . 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 Chronic pain 11/05/2014  . Chronic knee pain (Right) 11/05/2014  . Osteoarthritis of knee (Right) 11/05/2014  . Opiate use (30 MME/Day) 11/05/2014  . Long term current use of opiate analgesic 11/05/2014  . Long term prescription opiate use 11/05/2014  . Encounter for therapeutic drug level monitoring 11/05/2014  . Morbid obesity ([place]) 10/27/2014  . Diabetes mellitus, type 2 ([place]) 10/07/2014  . Extreme obesity ([place]) 10/07/2014  . Seizure ([place]) 10/07/2014  . Gastro-esophageal reflux disease without esophagitis 06/24/2014  . Memory loss, short term 03/19/2014  . Type 2 diabetes mellitus ([place]) 03/19/2014  . Acquired hypothyroidism 01/14/2014  . Clinical depression 10/14/2013  . Essential (primary) hypertension 10/14/2013  . Anxiety 07/09/2013  . Cannot sleep 07/09/2013  . Acid reflux 01/18/2013  . Adult hypothyroidism 01/18/2013  . HLD (hyperlipidemia) 01/18/2013  . Rheumatoid arthritis without elevated rheumatoid factor ([place]) 01/18/2013  . Seronegative rheumatoid arthritis ([place]) 01/18/2013  . Chronic kidney disease (CKD), stage III (moderate) 01/17/2013  . Congestive heart failure with left ventricular systolic dysfunction ([place]) 11/10/2012   Laura [name], PT, DPT [id number]  Laura [name] 06/22/2015, 2:46 PM  [place] MAIN [facility] SERVICES [address] [place], [place], [address] Phone: [phone]   Fax:  [phone]  Name: Laura [name] MRN: [id number] Date of Birth: August 28, 1945

## 2015-06-23 ENCOUNTER — Ambulatory Visit: Payer: Medicare Other | Admitting: Physical Therapy

## 2015-06-23 DIAGNOSIS — R2681 Unsteadiness on feet: Secondary | ICD-10-CM | POA: Diagnosis not present

## 2015-06-23 NOTE — Therapy (Signed)
[place] MAIN [facility] SERVICES [address] [place], [place], [address] Phone: [phone]   Fax:  [phone]  Physical Therapy Treatment  Patient Details  Name: Laura [name] MRN: [id number] Date of Birth: 08-01-1945 Referring Provider: Candiss Norse  Encounter Date: 06/23/2015      PT End of Session - 06/23/15 1752    Visit Number 18   Number of Visits 25   Date for PT Re-Evaluation 07/07/15   Authorization Type 5/10   PT Start Time 1025   PT Stop Time 1050   PT Time Calculation (min) 25 min      Past Medical History  Diagnosis Date  . Anxiety   . Depression   . Chronic kidney disease   . Thyroid disease   . Allergy   . Sleep apnea   . Arthritis, degenerative 10/08/2013    Overview:    a.  Lumbar spine/spinal stenosis/foot drop.   b.  Hands.     Past Surgical History  Procedure Laterality Date  . Abdominal hysterectomy    . Cesarean section    . Replacement total knee Left   . Hip surgery Right     There were no vitals filed for this visit.      Subjective Assessment - 06/23/15 1751    Subjective Pt reports she is feeling better today. Pt has practiced getting her rollator into her car while her husband drove her to PT today. Pt has not had sciatic Sx for a few weeks.   Patient is accompained by: Family member  Laura [name] husband   Pertinent History extensive stenosis, L knee TKA, hip OA   How long can you stand comfortably? 3 min   Patient Stated Goals Improve balance, be able to exercise with less pain    Pain Onset More than a month ago        O: Pt entered/exited the pool via ramp with UE support on rail. Exercises performed in 4' depth  50 ft =1 lap  10 reps of mini squats (cued for anterior COM)   2 laps walking with propioception training, UE on noodles 2 laps walking w/ noodle places behind her back/ shoulder ext  to promote extensor strengthening 2 laps walking w/ dumbbells at waist to promote shoulder  depression 2 laps backward walking w/ noodle behind midback   5' seated relaxation                          PT Education - 06/22/15 1439    Education provided Yes   Education Details pt safety for loading/unloading rollator into the car.    Person(s) Educated Patient   Methods Explanation   Comprehension Verbalized understanding             PT Long Term Goals - 06/09/15 1451    PT LONG TERM GOAL #1   Title pt will be able to stand with light UE support x 5 min to assist with meal prep   Baseline 62mn 50s min 5/30   Time 8   Period Weeks   Status Partially Met   PT LONG TERM GOAL #2   Title pt will perform 5 x sit to stand in <15s to demo improved LE strength    Baseline 17s   Time 8   Period Weeks   Status Partially Met   PT LONG TERM GOAL #3   Title pt will improve 156malk to 0.76m29mto normalize  home mobility    Baseline 0.54ms   Time 8   Period Weeks   Status Partially Met   PT LONG TERM GOAL #4   Title pt will imrpove berg balance test by 6 pts to reduce fall risk    Time 8   Period Weeks   Status Achieved   PT LONG TERM GOAL #5   Title pt will improve 62malk distance by 15041fo improve endurance and community mobilty such as Dr. aptBobbye Riggs  Baseline + [id number] (400 vs 495) 2 rest breaks 5/3, 0 rest 5/30   Time 8   Period Weeks   Status Partially Met               Plan - 06/23/15 1752    Clinical Impression Statement Pt arrived to PT 15 min and thus, session was abbreviated. Pt progressed to exercises in moderate pool depth and increased use of UE resistance props today. Pt demonstrated less fear, more postural stability, and increased endurance. Pt required cues for more anterior COM with mini squats and shoulder retraction with UE strengthening exercises. Pt continues to progress towards her goals.    Rehab Potential Good   Clinical Impairments Affecting Rehab Potential co-morbidities    PT Frequency 2x / week   PT Duration 8  weeks   PT Treatment/Interventions ADLs/Self Care Home Management;Aquatic Therapy;Patient/family education;Neuromuscular re-education;Balance training;Therapeutic exercise;Therapeutic activities;Functional mobility training;Gait training;Manual techniques;Passive range of motion   PT Next Visit Plan Continue strengthening and balance, progress HEP as appropriate   PT Home Exercise Plan Seated LAQ with theraband, standing hip abduction, standing marches (to improve SLS balance), sit to stand without UE support   Consulted and Agree with Plan of Care Patient      Patient will benefit from skilled therapeutic intervention in order to improve the following deficits and impairments:  Decreased strength, Pain, Decreased mobility, Difficulty walking, Decreased endurance, Decreased balance, Decreased activity tolerance  Visit Diagnosis: Unsteadiness on feet     Problem List Patient Active Problem List   Diagnosis Date Noted  . Chronic hip pain (Left) 04/06/2015  . History of total knee replacement (Left) 02/09/2015  . History of femur fracture (Right) 02/09/2015  . 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 Lumbar foraminal stenosis (Bilateral L3-4 and L5-S1) 02/09/2015  . Chronic low back pain (Location of Primary Source of Pain) (Bilateral) (L>R) 01/22/2015  . Chronic pain syndrome 11/25/2014  . Opioid dependence (HCC)[place]/28/2016  . Lumbar spinal stenosis (5 mm Severe L3-4; 8 mm L4-5) 11/07/2014  . Lumbar spondylosis 11/07/2014  . Lumbar facet syndrome (Location of Primary Source of Pain) (Bilateral) (L>R) 11/07/2014  . 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 Chronic pain 11/05/2014  . Chronic knee pain (Right) 11/05/2014  . Osteoarthritis of knee (Right) 11/05/2014  . Opiate use (30 MME/Day) 11/05/2014  . Long term current use of opiate analgesic 11/05/2014  . Long term prescription opiate  use 11/05/2014  . Encounter for therapeutic drug level monitoring 11/05/2014  . Morbid obesity (HCC)[place]/17/2016  . Diabetes mellitus, type 2 (HCC)[place]/27/2016  . Extreme obesity (HCC)[place]/27/2016  . Seizure (HCC)[place]/27/2016  . Gastro-esophageal reflux disease without esophagitis 06/24/2014  . Memory loss, short term 03/19/2014  . Type 2 diabetes mellitus (HCC)[place]/09/2014  . Acquired hypothyroidism 01/14/2014  . Clinical depression 10/14/2013  . Essential (primary) hypertension 10/14/2013  . Anxiety 07/09/2013  . Cannot sleep  07/09/2013  . Acid reflux 01/18/2013  . Adult hypothyroidism 01/18/2013  . HLD (hyperlipidemia) 01/18/2013  . Rheumatoid arthritis without elevated rheumatoid factor ([place]) 01/18/2013  . Seronegative rheumatoid arthritis ([place]) 01/18/2013  . Chronic kidney disease (CKD), stage III (moderate) 01/17/2013  . Congestive heart failure with left ventricular systolic dysfunction ([place]) 11/10/2012    Jerl Mina ,PT, DPT, E-RYT  06/23/2015, 5:53 PM  [place] MAIN [facility] SERVICES [address] [place], [place], [address] Phone: [phone]   Fax:  [phone]  Name: Laura [name] MRN: [id number] Date of Birth: 05/23/1945

## 2015-06-25 ENCOUNTER — Ambulatory Visit: Payer: PRIVATE HEALTH INSURANCE | Admitting: Physical Therapy

## 2015-06-25 DIAGNOSIS — F325 Major depressive disorder, single episode, in full remission: Secondary | ICD-10-CM

## 2015-06-25 HISTORY — DX: Major depressive disorder, single episode, in full remission: F32.5

## 2015-06-29 ENCOUNTER — Ambulatory Visit: Payer: Medicare Other | Admitting: Physical Therapy

## 2015-06-30 ENCOUNTER — Ambulatory Visit: Payer: PRIVATE HEALTH INSURANCE

## 2015-07-02 ENCOUNTER — Ambulatory Visit: Payer: Medicare Other | Admitting: Physical Therapy

## 2015-07-02 DIAGNOSIS — R2681 Unsteadiness on feet: Secondary | ICD-10-CM

## 2015-07-02 NOTE — Therapy (Signed)
[place] MAIN [facility] SERVICES [address] [place], [place], [address] Phone: [phone]   Fax:  [phone]  Physical Therapy Treatment  Patient Details  Name: Laura [name] MRN: [id number] Date of Birth: [birth year] Referring Provider: Candiss Norse  Encounter Date: 07/02/2015      PT End of Session - 07/02/15 2135    Visit Number 19   Number of Visits 25   Date for PT Re-Evaluation 07/07/15   Authorization Type 6/10   PT Start Time 1020   PT Stop Time 1100   PT Time Calculation (min) 40 min   Activity Tolerance Patient tolerated treatment well;No increased pain   Behavior During Therapy [facility] for tasks assessed/performed      Past Medical History  Diagnosis Date  . Anxiety   . Depression   . Chronic kidney disease   . Thyroid disease   . Allergy   . Sleep apnea   . Arthritis, degenerative 10/08/2013    Overview:    a.  Lumbar spine/spinal stenosis/foot drop.   b.  Hands.     Past Surgical History  Procedure Laterality Date  . Abdominal hysterectomy    . Cesarean section    . Replacement total knee Left   . Hip surgery Right     There were no vitals filed for this visit.      Subjective Assessment - 07/02/15 2128    Subjective Pt reports she got a shot in her R shoulder which decreased her RA-related shoulder pain that occurred a few days ago. Pt has been able to lift her rolllator into her car several times since her last session.   Patient is accompained by: Family member  [name] husband   Pertinent History extensive stenosis, L knee TKA, hip OA   How long can you stand comfortably? 3 min   Patient Stated Goals Improve balance, be able to exercise with less pain    Pain Onset More than a month ago                     Adult Aquatic Therapy - 07/02/15 2130    Aquatic Therapy Subjective   Subjective Pt reported feeling fearful walking without support on a noodle or wall.       O: Pt entered/exited the  pool via ramp with UE support on rail (forward walking)   50 ft =1 lap  Exercises performed in 3'6" depth   2 laps walking with noodles in BUE, forward and backward with manual stimulated turbulence by PT   Exercises performed in 4' depth   2 laps walking with noodles in BUE, forward and backward with manual stimulated turbulence by PT   10x reps forward step ups without UE support   Exercises performed in 4'6" depth   4 laps walking with noodles in BUE, forward and backward   Stretches, (hip flexor, quads)                   PT Long Term Goals - 06/09/15 1451    PT LONG TERM GOAL #1   Title pt will be able to stand with light UE support x 5 min to assist with meal prep   Baseline 10mn 50s min 5/30   Time 8   Period Weeks   Status Partially Met   PT LONG TERM GOAL #2   Title pt will perform 5 x sit to stand in <15s to demo improved LE strength  Baseline 17s   Time 8   Period Weeks   Status Partially Met   PT LONG TERM GOAL #3   Title pt will improve 48mwalk to 0.853m to normalize home mobility    Baseline 0.48m59m  Time 8   Period Weeks   Status Partially Met   PT LONG TERM GOAL #4   Title pt will imrpove berg balance test by 6 pts to reduce fall risk    Time 8   Period Weeks   Status Achieved   PT LONG TERM GOAL #5   Title pt will improve 48m 74mk distance by 150ft3fimprove endurance and community mobilty such as Dr. apts.Bobbye RiggsBaseline + 95ft 18f (400 vs 495) 2 rest breaks 5/3, 0 rest 5/30   Time 8   Period Weeks   Status Partially Met               Plan - 07/02/15 2135    Clinical Impression Statement Pt has shown significant improvement and has progressed to walking at greater depths without LOB nor fear with UE support on two noodles. Pt was able to perform forward step ups without UE support. Pt did not require a rest break throughout the session until cool down which demonstrates her increased endurance.    Rehab Potential Good    Clinical Impairments Affecting Rehab Potential co-morbidities    PT Frequency 2x / week   PT Duration 8 weeks   PT Treatment/Interventions ADLs/Self Care Home Management;Aquatic Therapy;Patient/family education;Neuromuscular re-education;Balance training;Therapeutic exercise;Therapeutic activities;Functional mobility training;Gait training;Manual techniques;Passive range of motion   PT Next Visit Plan Continue strengthening and balance, progress HEP as appropriate   PT Home Exercise Plan Seated LAQ with theraband, standing hip abduction, standing marches (to improve SLS balance), sit to stand without UE support   Consulted and Agree with Plan of Care Patient      Patient will benefit from skilled therapeutic intervention in order to improve the following deficits and impairments:  Decreased strength, Pain, Decreased mobility, Difficulty walking, Decreased endurance, Decreased balance, Decreased activity tolerance  Visit Diagnosis: Unsteadiness on feet     Problem List Patient Active Problem List   Diagnosis Date Noted  . Chronic hip pain (Left) 04/06/2015  . History of total knee replacement (Left) 02/09/2015  . History of femur fracture (Right) 02/09/2015  . 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 Lumbar foraminal stenosis (Bilateral L3-4 and L5-S1) 02/09/2015  . Chronic low back pain (Location of Primary Source of Pain) (Bilateral) (L>R) 01/22/2015  . Chronic pain syndrome 11/25/2014  . Opioid dependence (HCC) 1[place]8/2016  . Lumbar spinal stenosis (5 mm Severe L3-4; 8 mm L4-5) 11/07/2014  . Lumbar spondylosis 11/07/2014  . Lumbar facet syndrome (Location of Primary Source of Pain) (Bilateral) (L>R) 11/07/2014  . 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 Chronic pain 11/05/2014  . Chronic knee pain (Right) 11/05/2014  . Osteoarthritis of knee (Right) 11/05/2014  . Opiate use (30  MME/Day) 11/05/2014  . Long term current use of opiate analgesic 11/05/2014  . Long term prescription opiate use 11/05/2014  . Encounter for therapeutic drug level monitoring 11/05/2014  . Morbid obesity (HCC) 1[place]7/2016  . Diabetes mellitus, type 2 (HCC) 0[place]7/2016  . Extreme obesity (HCC) 0[place]7/2016  . Seizure (HCC) 0[place]7/2016  . Gastro-esophageal reflux disease without esophagitis 06/24/2014  . Memory loss, short term 03/19/2014  . Type 2 diabetes mellitus (HCC)[place]  03/19/2014  . Acquired hypothyroidism 01/14/2014  . Clinical depression 10/14/2013  . Essential (primary) hypertension 10/14/2013  . Anxiety 07/09/2013  . Cannot sleep 07/09/2013  . Acid reflux 01/18/2013  . Adult hypothyroidism 01/18/2013  . HLD (hyperlipidemia) 01/18/2013  . Rheumatoid arthritis without elevated rheumatoid factor ([place]) 01/18/2013  . Seronegative rheumatoid arthritis ([place]) 01/18/2013  . Chronic kidney disease (CKD), stage III (moderate) 01/17/2013  . Congestive heart failure with left ventricular systolic dysfunction ([place]) 11/10/2012    Jerl Mina ,PT, DPT, E-RYT  07/02/2015, 9:36 PM  [place] MAIN [facility] SERVICES [address] [place], [place], [address] Phone: [phone]   Fax:  [phone]  Name: Casondra Gasca MRN: [id number] Date of Birth: 1945/03/23

## 2015-07-06 ENCOUNTER — Ambulatory Visit: Payer: PRIVATE HEALTH INSURANCE | Admitting: Physical Therapy

## 2015-07-07 ENCOUNTER — Ambulatory Visit: Payer: Medicare Other

## 2015-07-07 DIAGNOSIS — R2681 Unsteadiness on feet: Secondary | ICD-10-CM

## 2015-07-07 NOTE — Therapy (Signed)
[place] MAIN [facility] SERVICES [address] [place], [place], [address] Phone: [phone]   Fax:  [phone]  Physical Therapy Treatment  Patient Details  Name: Laura [name] MRN: [id number] Date of Birth: 07-Jul-1945 Referring Provider: Candiss Norse  Encounter Date: 07/07/2015      PT End of Session - 07/07/15 1659    Visit Number 20   Number of Visits 25   Date for PT Re-Evaluation 07/07/15   Authorization Type 1/10   PT Start Time 1350   PT Stop Time 1430   PT Time Calculation (min) 40 min   Equipment Utilized During Treatment Gait belt   Activity Tolerance Patient tolerated treatment well;No increased pain   Behavior During Therapy [facility] for tasks assessed/performed      Past Medical History  Diagnosis Date  . Anxiety   . Depression   . Chronic kidney disease   . Thyroid disease   . Allergy   . Sleep apnea   . Arthritis, degenerative 10/08/2013    Overview:    a.  Lumbar spine/spinal stenosis/foot drop.   b.  Hands.     Past Surgical History  Procedure Laterality Date  . Abdominal hysterectomy    . Cesarean section    . Replacement total knee Left   . Hip surgery Right     There were no vitals filed for this visit.      Subjective Assessment - 07/07/15 1659    Subjective  pt reports she has joined aquatic center. she reports she feels so much better   Patient is accompained by: Family member  [name] husband   Pertinent History extensive stenosis, L knee TKA, hip OA   How long can you stand comfortably? 3 min   Patient Stated Goals Improve balance, be able to exercise with less pain    Currently in Pain? Yes   Pain Score 3    Pain Location Back   Pain Orientation Lower   Pain Onset More than a month ago      PT reassessed goals and outcome measures as follows:      [facility] PT Assessment - 07/07/15 0001    6 Minute walk- Post Test   6 Minute Walk Post Test (p) --   6 minute walk test results    Aerobic  Endurance Distance Walked (p) 560f at 6 min walk test. No seated break   Standardized Balance Assessment   Five times sit to stand comments  16s   10 Meter Walk 0.683m   Berg Balance Test   Sit to Stand Able to stand  independently using hands   Standing Unsupported Able to stand 30 seconds unsupported   Sitting with Back Unsupported but Feet Supported on Floor or Stool Able to sit safely and securely 2 minutes   Stand to Sit Controls descent by using hands   Transfers Able to transfer safely, definite need of hands   Standing Unsupported with Eyes Closed Able to stand 3 seconds   Standing Ubsupported with Feet Together Able to place feet together independently but unable to hold for 30 seconds   From Standing, Reach Forward with Outstretched Arm Can reach forward >5 cm safely (2")   From Standing Position, Pick up Object from Floor Unable to pick up and needs supervision   From Standing Position, Turn to Look Behind Over each Shoulder Needs supervision when turning   Turn 360 Degrees Needs assistance while turning   Standing Unsupported, Alternately Place Feet on  Step/Stool Able to complete >2 steps/needs minimal assist   Standing Unsupported, One Foot in Front Able to take small step independently and hold 30 seconds   Standing on One Leg Tries to lift leg/unable to hold 3 seconds but remains standing independently   Total Score 27                             PT Education - August 01, 2015 1659    Education provided Yes   Education Details continue exercise   Person(s) Educated Patient   Methods Explanation   Comprehension Verbalized understanding             PT Long Term Goals - 2015-08-01 1700    PT LONG TERM GOAL #1   Title pt will be able to stand with light UE support x 5 min to assist with meal prep   Baseline 40mn 50s min 5/30   Time 8   Period Weeks   Status Achieved   PT LONG TERM GOAL #2   Title pt will perform 5 x sit to stand in <15s to demo  improved LE strength    Baseline 17s/16s   Time 8   Period Weeks   Status Partially Met   PT LONG TERM GOAL #3   Title pt will improve 147malk to 0.40m2mto normalize home mobility    Baseline 0.37m/537m0.66   Time 8   Period Weeks   Status Partially Met   PT LONG TERM GOAL #4   Title pt will imrpove berg balance test by 6 pts to reduce fall risk    Time 8   Period Weeks   Status Achieved   PT LONG TERM GOAL #5   Title pt will improve 37m w25m distance by 150ft 32fmprove endurance and community mobilty such as Dr. apts. Bobbye Riggsaseline + 95ft 519f(400 vs 495) 2 rest breaks 5/3, 0 rest 5/30/ 540ft   40fe 8   Period Weeks   Status Achieved               Plan - 07/06/1705-22-2017 Clinical Impression Statement pt has achieved majority to PT goals at this time and shows significant progerss with strength andbalance and endurance since starting PT. pt is independent with HEP as well including aquatic program. she will be DC at this time.    Rehab Potential Good   Clinical Impairments Affecting Rehab Potential co-morbidities    PT Frequency 2x / week   PT Duration 8 weeks   PT Treatment/Interventions ADLs/Self Care Home Management;Aquatic Therapy;Patient/family education;Neuromuscular re-education;Balance training;Therapeutic exercise;Therapeutic activities;Functional mobility training;Gait training;Manual techniques;Passive range of motion   PT Next Visit Plan Continue strengthening and balance, progress HEP as appropriate   PT Home Exercise Plan Seated LAQ with theraband, standing hip abduction, standing marches (to improve SLS balance), sit to stand without UE support   Consulted and Agree with Plan of Care Patient      Patient will benefit from skilled therapeutic intervention in order to improve the following deficits and impairments:  Decreased strength, Pain, Decreased mobility, Difficulty walking, Decreased endurance, Decreased balance, Decreased activity tolerance  Visit  Diagnosis: Unsteadiness on feet       G-Codes - 07/06/1705/22/17 Functional Assessment Tool Used 10mwalk/60mwalk/berg   Functional Limitation Mobility: Walking and moving around   Mobility: Walking and Moving Around Current Status (G8978) A([id number] 40 percent but  less than 60 percent impaired, limited or restricted   Mobility: Walking and Moving Around Goal Status [phone]) At least 40 percent but less than 60 percent impaired, limited or restricted   Mobility: Walking and Moving Around Discharge Status [phone]) At least 40 percent but less than 60 percent impaired, limited or restricted      Problem List Patient Active Problem List   Diagnosis Date Noted  . Chronic hip pain (Left) 04/06/2015  . History of total knee replacement (Left) 02/09/2015  . History of femur fracture (Right) 02/09/2015  . 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 Lumbar foraminal stenosis (Bilateral L3-4 and L5-S1) 02/09/2015  . Chronic low back pain (Location of Primary Source of Pain) (Bilateral) (L>R) 01/22/2015  . Chronic pain syndrome 11/25/2014  . Opioid dependence ([place]) 11/07/2014  . Lumbar spinal stenosis (5 mm Severe L3-4; 8 mm L4-5) 11/07/2014  . Lumbar spondylosis 11/07/2014  . Lumbar facet syndrome (Location of Primary Source of Pain) (Bilateral) (L>R) 11/07/2014  . 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 Chronic pain 11/05/2014  . Chronic knee pain (Right) 11/05/2014  . Osteoarthritis of knee (Right) 11/05/2014  . Opiate use (30 MME/Day) 11/05/2014  . Long term current use of opiate analgesic 11/05/2014  . Long term prescription opiate use 11/05/2014  . Encounter for therapeutic drug level monitoring 11/05/2014  . Morbid obesity ([place]) 10/27/2014  . Diabetes mellitus, type 2 ([place]) 10/07/2014  . Extreme obesity ([place]) 10/07/2014  . Seizure ([place]) 10/07/2014  . Gastro-esophageal reflux disease without  esophagitis 06/24/2014  . Memory loss, short term 03/19/2014  . Type 2 diabetes mellitus ([place]) 03/19/2014  . Acquired hypothyroidism 01/14/2014  . Clinical depression 10/14/2013  . Essential (primary) hypertension 10/14/2013  . Anxiety 07/09/2013  . Cannot sleep 07/09/2013  . Acid reflux 01/18/2013  . Adult hypothyroidism 01/18/2013  . HLD (hyperlipidemia) 01/18/2013  . Rheumatoid arthritis without elevated rheumatoid factor ([place]) 01/18/2013  . Seronegative rheumatoid arthritis ([place]) 01/18/2013  . Chronic kidney disease (CKD), stage III (moderate) 01/17/2013  . Congestive heart failure with left ventricular systolic dysfunction ([place]) 11/10/2012   Caryl Pina C. Rod Majerus, PT, DPT [phone]  [place] Thrall 07/07/2015, 5:02 PM  [place] MAIN [facility] SERVICES [address] [place], [place], [address] Phone: [phone]   Fax:  [phone]  Name: Laura [name] MRN: [id number] Date of Birth: 03/23/1945

## 2015-07-09 ENCOUNTER — Ambulatory Visit: Payer: PRIVATE HEALTH INSURANCE | Admitting: Physical Therapy

## 2015-07-15 ENCOUNTER — Ambulatory Visit: Payer: Medicare Other | Attending: Pain Medicine | Admitting: Pain Medicine

## 2015-07-15 ENCOUNTER — Encounter: Payer: Self-pay | Admitting: Pain Medicine

## 2015-07-15 ENCOUNTER — Other Ambulatory Visit
Admission: RE | Admit: 2015-07-15 | Discharge: 2015-07-15 | Disposition: A | Discharge status: Home / Self Care | Payer: Medicare Other | Source: Ambulatory Visit | Attending: Pain Medicine | Admitting: Pain Medicine

## 2015-07-15 VITALS — BP 135/95 | HR 90 | Temp 98.7°F | Resp 16 | Ht 65.0 in | Wt 272.0 lb

## 2015-07-15 DIAGNOSIS — M4807 Spinal stenosis, lumbosacral region: Secondary | ICD-10-CM | POA: Diagnosis not present

## 2015-07-15 DIAGNOSIS — M160 Bilateral primary osteoarthritis of hip: Secondary | ICD-10-CM | POA: Diagnosis not present

## 2015-07-15 DIAGNOSIS — K219 Gastro-esophageal reflux disease without esophagitis: Secondary | ICD-10-CM | POA: Insufficient documentation

## 2015-07-15 DIAGNOSIS — R700 Elevated erythrocyte sedimentation rate: Secondary | ICD-10-CM | POA: Insufficient documentation

## 2015-07-15 DIAGNOSIS — E785 Hyperlipidemia, unspecified: Secondary | ICD-10-CM | POA: Diagnosis not present

## 2015-07-15 DIAGNOSIS — G8929 Other chronic pain: Secondary | ICD-10-CM

## 2015-07-15 DIAGNOSIS — M545 Low back pain, unspecified: Secondary | ICD-10-CM

## 2015-07-15 DIAGNOSIS — M5126 Other intervertebral disc displacement, lumbar region: Secondary | ICD-10-CM | POA: Insufficient documentation

## 2015-07-15 DIAGNOSIS — M25561 Pain in right knee: Secondary | ICD-10-CM | POA: Insufficient documentation

## 2015-07-15 DIAGNOSIS — M170 Bilateral primary osteoarthritis of knee: Secondary | ICD-10-CM | POA: Insufficient documentation

## 2015-07-15 DIAGNOSIS — Z79891 Long term (current) use of opiate analgesic: Secondary | ICD-10-CM | POA: Insufficient documentation

## 2015-07-15 DIAGNOSIS — Z96652 Presence of left artificial knee joint: Secondary | ICD-10-CM | POA: Insufficient documentation

## 2015-07-15 DIAGNOSIS — R209 Unspecified disturbances of skin sensation: Secondary | ICD-10-CM | POA: Diagnosis present

## 2015-07-15 DIAGNOSIS — I129 Hypertensive chronic kidney disease with stage 1 through stage 4 chronic kidney disease, or unspecified chronic kidney disease: Secondary | ICD-10-CM | POA: Insufficient documentation

## 2015-07-15 DIAGNOSIS — M4806 Spinal stenosis, lumbar region: Secondary | ICD-10-CM | POA: Insufficient documentation

## 2015-07-15 DIAGNOSIS — M25551 Pain in right hip: Secondary | ICD-10-CM | POA: Insufficient documentation

## 2015-07-15 DIAGNOSIS — M48061 Spinal stenosis, lumbar region without neurogenic claudication: Secondary | ICD-10-CM

## 2015-07-15 DIAGNOSIS — R7982 Elevated C-reactive protein (CRP): Secondary | ICD-10-CM | POA: Diagnosis not present

## 2015-07-15 DIAGNOSIS — F1190 Opioid use, unspecified, uncomplicated: Secondary | ICD-10-CM

## 2015-07-15 DIAGNOSIS — R202 Paresthesia of skin: Secondary | ICD-10-CM | POA: Insufficient documentation

## 2015-07-15 DIAGNOSIS — M25552 Pain in left hip: Secondary | ICD-10-CM

## 2015-07-15 DIAGNOSIS — Z7982 Long term (current) use of aspirin: Secondary | ICD-10-CM | POA: Insufficient documentation

## 2015-07-15 DIAGNOSIS — Z5181 Encounter for therapeutic drug level monitoring: Secondary | ICD-10-CM

## 2015-07-15 DIAGNOSIS — Z6841 Body Mass Index (BMI) 40.0 and over, adult: Secondary | ICD-10-CM | POA: Insufficient documentation

## 2015-07-15 DIAGNOSIS — F325 Major depressive disorder, single episode, in full remission: Secondary | ICD-10-CM | POA: Insufficient documentation

## 2015-07-15 DIAGNOSIS — M47816 Spondylosis without myelopathy or radiculopathy, lumbar region: Secondary | ICD-10-CM

## 2015-07-15 DIAGNOSIS — M25569 Pain in unspecified knee: Secondary | ICD-10-CM | POA: Diagnosis present

## 2015-07-15 LAB — COMPREHENSIVE METABOLIC PANEL
ALT: 15 U/L (ref 14–54)
AST: 19 U/L (ref 15–41)
Albumin: 4 g/dL (ref 3.5–5.0)
Alkaline Phosphatase: 73 U/L (ref 38–126)
Anion gap: 8 (ref 5–15)
BUN: 21 mg/dL — ABNORMAL HIGH (ref 6–20)
CO2: 31 mmol/L (ref 22–32)
Calcium: 9.1 mg/dL (ref 8.9–10.3)
Chloride: 101 mmol/L (ref 101–111)
Creatinine, Ser: 1.22 mg/dL — ABNORMAL HIGH (ref 0.44–1.00)
GFR calc Af Amer: 51 mL/min — ABNORMAL LOW (ref >60)
GFR calc non Af Amer: 44 mL/min — ABNORMAL LOW (ref >60)
Glucose, Bld: 136 mg/dL — ABNORMAL HIGH (ref 65–99)
Potassium: 3.7 mmol/L (ref 3.5–5.1)
Sodium: 140 mmol/L (ref 135–145)
Total Bilirubin: 0.4 mg/dL (ref 0.3–1.2)
Total Protein: 7.3 g/dL (ref 6.5–8.1)

## 2015-07-15 LAB — VITAMIN B12: Vitamin B-12: 610 pg/mL (ref 180–914)

## 2015-07-15 LAB — MAGNESIUM: Magnesium: 2.1 mg/dL (ref 1.7–2.4)

## 2015-07-15 LAB — C-REACTIVE PROTEIN: CRP: 1.9 mg/dL — ABNORMAL HIGH (ref <1.0)

## 2015-07-15 LAB — SEDIMENTATION RATE: Sed Rate: 32 mm/hr — ABNORMAL HIGH (ref 0–30)

## 2015-07-15 MED ORDER — OXYCODONE HCL 5 MG PO TABS: 5.0000 mg | ORAL_TABLET | Freq: Four times a day (QID) | ORAL | Status: DC | PRN

## 2015-07-15 NOTE — Progress Notes (Signed)
Patient here for medication management.  Patient had a fall in her home when she tripped over the wheel of her walker.  Did not have to seek medical attention. Right knee and right shoulder affected.   Patient did not bring medication for count.  Stated she did not know that she was supposed to bring.  Safety precautions to be maintained throughout the outpatient stay will include: orient to surroundings, keep bed in low position, maintain call bell within reach at all times, provide assistance with transfer out of bed and ambulation.

## 2015-07-15 NOTE — Progress Notes (Signed)
Patient's Name: Nafisa Olds  Patient type: Established  MRN: [id number]  Service setting: Ambulatory outpatient  DOB: 1945-12-17  Location: [facility]  DOS: 07/15/2015  Primary Care Physician: Glendon Axe, MD  Note by: Kathlen Brunswick. Dossie Arbour, M.D, DABA, DABAPM, DABPM, DABIPP, FIPP  Referring Physician: Glendon Axe, MD  Specialty: Board-Certified Interventional Pain Management  Last Visit to Pain Management: 05/01/2015   Primary Reason(s) for Visit: Encounter for prescription drug management (Level of risk: moderate) CC: Knee Pain and Spinal Stenosis   HPI  Ms. Labarge is a 70 y.o. year old, female patient, who returns today as an established patient. She has Acquired hypothyroidism; Anxiety; Chronic kidney disease (CKD), stage III (moderate); Clinical depression; Diabetes mellitus, type 2 ([place]); Essential (primary) hypertension; Acid reflux; Adult hypothyroidism; HLD (hyperlipidemia); Gastro-esophageal reflux disease without esophagitis; Extreme obesity ([place]); Cannot sleep; Rheumatoid arthritis without elevated rheumatoid factor ([place]); Seizure ([place]); Memory loss, short term; Congestive heart failure with left ventricular systolic dysfunction ([place]); Type 2 diabetes mellitus ([place]); Morbid obesity ([place]); Seronegative rheumatoid arthritis ([place]); Chronic pain; Chronic knee pain (Right); Opiate use (30 MME/Day); Long term current use of opiate analgesic; Long term prescription opiate use; Encounter for therapeutic drug level monitoring; Opioid dependence ([place]); Lumbar spinal stenosis (5 mm Severe L3-4; 8 mm L4-5); Lumbar spondylosis; Lumbar facet syndrome (Location of Primary Source of Pain) (Bilateral) (L>R); Lumbar spinal stenosis with neurogenic claudication; Chronic pain syndrome; Chronic low back pain (Location of Primary Source of Pain) (Bilateral) (L>R); History of TKR (total knee replacement) (Left); History of femur fracture (Right); 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 Her primarily concern today is the Knee Pain and Spinal Stenosis   Pain Assessment: Self-Reported Pain Score: 5  Clinically the patient looks like a 2/10 Reported level is inconsistent with clinical obrservations Information on the proper use of the pain score provided to the patient today. Pain Type: Chronic pain Pain Location: Knee (spinal stenosis. ) Pain Orientation: Right Pain Descriptors / Indicators: Radiating, Shooting (ambulating makes pain worse in her knee, some bruising noted.) Pain Frequency: Constant  The patient comes into the clinics today for pharmacological management of her chronic pain. I last saw this patient on 05/01/2015. The patient  reports that she does not use illicit drugs. Her body mass index is 45.26 kg/(m^2).  Date of Last Visit: 05/01/15 Service Provided on Last Visit: Med Refill  Controlled Substance Pharmacotherapy Assessment & REMS (Risk Evaluation and Mitigation Strategy)  Analgesic: Oxycodone IR 5 mg every 6 hours (20 mg/day) MME/day: 30 mg/day Pill Count: Patient did not bring medication for count. Stated she did not know that she was supposed to bring.I spoke to her and confronted her about this and she admitted that the last time I saw her I have told her about bringing the medication and she simply forgot. Today I stated the importance of doing this on the fact that if it occurs again we will be refilling her medicine until she gets those pills to be counted. Pharmacokinetics: Onset of action (Liberation/Absorption): Within expected pharmacological parameters Time to Peak effect (Distribution): Timing and results are as within normal  expected parameters Duration of action (Metabolism/Excretion): Within normal limits for medication Pharmacodynamics: Analgesic Effect: More than 50% Activity Facilitation: Medication(s) allow patient to sit, stand, walk,  and do the basic ADLs Perceived Effectiveness: Described as relatively effective, allowing for increase in activities of daily living (ADL) Side-effects or Adverse reactions: None reported Monitoring: [place] PMP: Online review of the past 45-monthperiod conducted. Compliant with practice rules and regulations Last UDS on record: TOXASSURE SELECT 13  Date Value Ref Range Status  05/01/2015 FINAL  Final    Comment:    ==================================================================== TOXASSURE SELECT 13 (MW) ==================================================================== Test                             Result       Flag       Units Drug Present and Declared for Prescription Verification   Oxycodone                      296          EXPECTED   ng/mg creat   Oxymorphone                    134          EXPECTED   ng/mg creat   Noroxycodone                   3134         EXPECTED   ng/mg creat   Noroxymorphone                 101          EXPECTED   ng/mg creat    Sources of oxycodone are scheduled prescription medications.    Oxymorphone, noroxycodone, and noroxymorphone are expected    metabolites of oxycodone. Oxymorphone is also available as a    scheduled prescription medication. Drug Absent but Declared for Prescription Verification   Tramadol                       Not Detected UNEXPECTED ==================================================================== Test                      Result    Flag   Units      Ref Range   Creatinine              152              mg/dL      >=20 ==================================================================== Declared Medications:  The flagging and interpretation on this report are based on the  following declared  medications.  Unexpected results may arise from  inaccuracies in the declared medications.  **Note: The testing scope of this panel includes these medications:  Oxycodone  Tramadol (Ultram)  **Note: The testing scope of this panel does not include following  reported medications:  Amlodipine (Norvasc)  Aspirin  Duloxetine (Cymbalta)  Furosemide (Lasix)  Hydroxychloroquine (Plaquenil)  Levothyroxine  Loratadine  Metoprolol  Omeprazole (Nexium)  Quetiapine (Seroquel)  Spironolactone (Aldactone)  Vitamin D ==================================================================== For clinical consultation, please call ([phone] ====================================================================    UDS interpretation: Compliant          Medication Assessment Form: Reviewed. Patient indicates being compliant with therapy Treatment compliance: Deficiencies noted and steps taken to remind the patient of the seriousness of adequate therapy compliance. Patient reminded to bring her pills to be counted. Risk Assessment: Aberrant Behavior: None observed today Substance Use Disorder (SUD) Risk Level: Moderate-to-high Risk of opioid abuse or dependence: 0.7-3.0% with doses ? 36 MME/day and  6.1-26% with doses ? 120 MME/day. Opioid Risk Tool (ORT) Score: Total Score: 4 Moderate Risk for SUD (Score between 4-7) Depression Scale Score: PHQ-2: PHQ-2 Total Score: 0 No depression (0) PHQ-9: PHQ-9 Total Score: 0 No depression (0-4)  Pharmacologic Plan: No change in therapy, at this time  Laboratory Chemistry  Inflammation Markers Lab Results  Component Value Date   ESRSEDRATE 32* 07/15/2015   CRP 1.9* 07/15/2015    Renal Function Lab Results  Component Value Date   BUN 21* 07/15/2015   CREATININE 1.22* 07/15/2015   GFRAA 51* 07/15/2015   GFRNONAA 44* 07/15/2015    Hepatic Function Lab Results  Component Value Date   AST 19 07/15/2015   ALT 15 07/15/2015   ALBUMIN 4.0  07/15/2015    Electrolytes Lab Results  Component Value Date   NA 140 07/15/2015   K 3.7 07/15/2015   CL 101 07/15/2015   CALCIUM 9.1 07/15/2015   MG 2.1 07/15/2015    Pain Modulating Vitamins Lab Results  Component Value Date   VITAMINB12 610 07/15/2015    Coagulation Parameters Lab Results  Component Value Date   PLT 251 06/12/2013    Note: Labs Reviewed.  Recent Diagnostic Imaging  Dg Hip Unilat W Or W/o Pelvis 2-3 Views Left  02/09/2015  CLINICAL DATA:  Acute exacerbation of left hip pain. EXAM: DG HIP (WITH OR WITHOUT PELVIS) 2-3V LEFT COMPARISON:  None. FINDINGS: Degenerative changes seen in the left hip with osteophytes and mild loss of joint space. No bony lesions or bony erosion. No fractures or dislocations identified. IMPRESSION: Degenerative changes. Electronically Signed   By: Dorise Bullion III M.D   On: 02/09/2015 15:25   Dg Hip Unilat W Or W/o Pelvis 2-3 Views Right  02/09/2015  CLINICAL DATA:  Chronic right hip pain, acute left hip pain EXAM: DG HIP (WITH OR WITHOUT PELVIS) 2-3V RIGHT COMPARISON:  None in PACs FINDINGS: The patient has undergone ORIF for previous proximal femoral fracture. No fracture line is visible today. The telescoping screw and side plate and cortical screws appear intact. There is moderate symmetric narrowing of the right hip joint space. There is bony deformity of the inferior pubic ramus on the right which may be chronic. The superior pubic ramus appears intact. There is calcification in the wall of the common femoral artery. IMPRESSION: There is moderate symmetric narrowing of the right hip joint space consistent with osteoarthritis. There is no acute bony abnormality of the right hip. There is bony deformity of the inferior pubic ramus on the right which is likely old. There are no previous studies with which to compare. Electronically Signed   By: David  Martinique M.D.   On: 02/09/2015 15:24   Lumbosacral Imaging: Lumbar MR wo contrast:   Results for orders placed in visit on 12/13/11  MR L Spine Ltd W/O Cm   Narrative * PRIOR REPORT IMPORTED FROM AN EXTERNAL SYSTEM *   PRIOR REPORT IMPORTED FROM THE SYNGO WORKFLOW SYSTEM   REASON FOR EXAM:    low back and leg pain foot drop no response to meds  eval  for spinal stensis  COMMENTS:   PROCEDURE:     MMR - MMR LUMBAR SPINE WO CONTRAST  - Dec 13 2011  2:28PM   RESULT:     History: Low back pain footdrop.   Comparison Study: No prior.   Findings: Multiplanar, multisequence imaging lumbar spine is obtained. No  acute bony abnormality identified. Multiple renal cysts are present.  T12-L1 no significant abnormality.   L1-L2 no significant abnormality. L2-L3 mild annular bulge and facet  hypertrophy with mild flattening of lateral recesses and mild narrowing of  the neural foramen bilaterally.    L3-L4 prominent annular bulge facet hypertrophy hypertrophy ligament  flavum  with severe spinal stenosis to approxi- 5 mm. Bilateral prominent neural  foraminal narrowing present.   L4-L5 annular bulge and left paracentral disc protrusion. Facet  hypertrophy.  Spinal stenosis to approximately 8 mm noted; there is severe flattening  left  lateral recess and narrowing left neural foramen. Moderate narrowing right  neural foramen.   L5-S1 mild annular bulge and facet hypertrophy with moderate bilateral  neural frontal narrowing.   IMPRESSION:      Multilevel disc degeneration spinal stenosis with  multilevel neural foraminal narrowing. Level specific findings as above.       Hip Imaging: Hip-R DG 2-3 views:  Results for orders placed during the hospital encounter of 02/09/15  DG HIP UNILAT W OR W/O PELVIS 2-3 VIEWS RIGHT   Narrative CLINICAL DATA:  Chronic right hip pain, acute left hip pain  EXAM: DG HIP (WITH OR WITHOUT PELVIS) 2-3V RIGHT  COMPARISON:  None in PACs  FINDINGS: The patient has undergone ORIF for previous proximal femoral fracture. No  fracture line is visible today. The telescoping screw and side plate and cortical screws appear intact. There is moderate symmetric narrowing of the right hip joint space. There is bony deformity of the inferior pubic ramus on the right which may be chronic. The superior pubic ramus appears intact. There is calcification in the wall of the common femoral artery.  IMPRESSION: There is moderate symmetric narrowing of the right hip joint space consistent with osteoarthritis. There is no acute bony abnormality of the right hip. There is bony deformity of the inferior pubic ramus on the right which is likely old. There are no previous studies with which to compare.   Electronically Signed   By: David  Martinique M.D.   On: 02/09/2015 15:24    Hip-L DG 2-3 views:  Results for orders placed during the hospital encounter of 02/09/15  DG HIP UNILAT W OR W/O PELVIS 2-3 VIEWS LEFT   Narrative CLINICAL DATA:  Acute exacerbation of left hip pain.  EXAM: DG HIP (WITH OR WITHOUT PELVIS) 2-3V LEFT  COMPARISON:  None.  FINDINGS: Degenerative changes seen in the left hip with osteophytes and mild loss of joint space. No bony lesions or bony erosion. No fractures or dislocations identified.  IMPRESSION: Degenerative changes.   Electronically Signed   By: Dorise Bullion III M.D   On: 02/09/2015 15:25    Note: Imaging reviewed.  Meds  The patient has a current medication list which includes the following prescription(s): amlodipine, aspirin ec, vitamin d, duloxetine, esomeprazole, furosemide, hydroxychloroquine, levothyroxine, levothyroxine, loratadine, metoprolol succinate, oxycodone, oxycodone, oxycodone, quetiapine, spironolactone, hydroxychloroquine, quetiapine, and tramadol.  Current Outpatient Prescriptions on File Prior to Visit  Medication Sig  . amLODipine (NORVASC) 10 MG tablet Take 5 mg by mouth 2 (two) times daily.   Marland Kitchen aspirin EC 81 MG tablet Take by mouth daily.   .  Cholecalciferol (VITAMIN D) 2000 UNITS tablet Take by mouth daily.   . DULoxetine (CYMBALTA) 30 MG capsule Two tablets in the morning, and one in the evening.  Marland Kitchen esomeprazole (NEXIUM) 20 MG capsule Take by mouth daily.   . furosemide (LASIX) 40 MG tablet TAKE 1 TABLET ONCE DAILY  . levothyroxine (SYNTHROID, LEVOTHROID) 200 MCG tablet  TAKE 1 TABLET ONCE DAILY- ON AN EMPTY STOMACH WITH A GLASS OF WATER 30-60 MINUTES BEFORE BREAKFAST  . levothyroxine (SYNTHROID, LEVOTHROID) 25 MCG tablet TAKE 1 TABLET BY MOUTH EVERY DAY (TAKE WITH 20O MCG TABLET TO EQUAL A 225 MCG DOSE)  . loratadine (CLARITIN) 10 MG tablet Take by mouth daily as needed.   . metoprolol succinate (TOPROL-XL) 50 MG 24 hr tablet TAKE 1 TABLET (50 MG TOTAL) BY MOUTH ONCE DAILY.  Marland Kitchen QUEtiapine (SEROQUEL) 200 MG tablet Take 1 tablet (200 mg total) by mouth at bedtime.  Marland Kitchen spironolactone (ALDACTONE) 25 MG tablet TAKE 1/2 (ONE-HALF) TABLET BY MOUTH DAILY  . hydroxychloroquine (PLAQUENIL) 200 MG tablet Reported on 07/15/2015  . QUEtiapine (SEROQUEL) 50 MG tablet Take 1 tablet (50 mg total) by mouth 2 (two) times daily. (Patient not taking: Reported on 07/15/2015)  . traMADol (ULTRAM) 50 MG tablet Reported on 07/15/2015   No current facility-administered medications on file prior to visit.    ROS  Constitutional: Denies any fever or chills Gastrointestinal: No reported hemesis, hematochezia, vomiting, or acute GI distress Musculoskeletal: Denies any acute onset joint swelling, redness, loss of ROM, or weakness Neurological: No reported episodes of acute onset apraxia, aphasia, dysarthria, agnosia, amnesia, paralysis, loss of coordination, or loss of consciousness  Allergies  Ms. Hubert is allergic to penicillins.  [place]  Medical:  Ms. Russi  has a past medical history of Anxiety; Depression; Chronic kidney disease; Thyroid disease; Allergy; Sleep apnea; and Arthritis, degenerative (10/08/2013). Family: family history includes Alcohol abuse  in her father; Depression in her father and sister; Heart attack in her father; Hypertension in her mother and sister; Post-traumatic stress disorder in her father; Rheum arthritis in her sister; Stroke in her mother. Surgical:  has past surgical history that includes Abdominal hysterectomy; Cesarean section; Replacement total knee (Left); and Hip surgery (Right). Tobacco:  reports that she has never smoked. She has never used smokeless tobacco. Alcohol:  reports that she does not drink alcohol. Drug:  reports that she does not use illicit drugs.  Constitutional Exam  Vitals: Blood pressure 135/95, pulse 90, temperature 98.7 F (37.1 C), temperature source Oral, resp. rate 16, height _0  (1.651 m), weight 272 lb (123.378 kg), SpO2 100 %. General appearance: Well nourished, well developed, and well hydrated. In no acute distress Calculated BMI/Body habitus: Body mass index is 45.26 kg/(m^2).       Psych/Mental status: Alert and oriented x 3 (person, place, & time) Eyes: PERLA Respiratory: No evidence of acute respiratory distress  Cervical Spine Exam  Inspection: No masses, redness, or swelling Alignment: Symmetrical ROM: Functional: ROM is within functional limits [facility]) Stability: No instability detected Muscle strength & Tone: Functionally intact Sensory: Unimpaired Palpation: No complaints of tenderness  Upper Extremity (UE) Exam    Side: Right upper extremity  Side: Left upper extremity  Inspection: No masses, redness, swelling, or asymmetry  Inspection: No masses, redness, swelling, or asymmetry  ROM:  ROM:  Functional: ROM is within functional limits [facility])        Functional: ROM is within functional limits [facility])        Muscle strength & Tone: Functionally intact  Muscle strength & Tone: Functionally intact  Sensory: Unimpaired  Sensory: Unimpaired  Palpation: No complaints of tenderness  Palpation: No complaints of tenderness   Thoracic Spine Exam  Inspection: No masses,  redness, or swelling Alignment: Symmetrical ROM: Functional: ROM is within functional limits [facility]) Stability: No instability detected Sensory: Unimpaired Muscle strength &  Tone: Functionally intact Palpation: No complaints of tenderness  Lumbar Spine Exam  Inspection: No masses, redness, or swelling Alignment: Symmetrical ROM: Functional: ROM is within functional limits [facility]) Stability: No instability detected Muscle strength & Tone: Functionally intact Sensory: Unimpaired Palpation: No complaints of tenderness Provocative Tests: Lumbar Hyperextension and rotation test: deferred       Patrick's Maneuver: deferred              Gait & Posture Assessment  Ambulation: Patient ambulates using a walker Gait: Limited. Using assistive device to ambulate Posture: WNL   Lower Extremity Exam    Side: Right lower extremity  Side: Left lower extremity  Inspection: No masses, redness, swelling, or asymmetry ROM:  Inspection: No masses, redness, swelling, or asymmetry ROM:  Functional: ROM is within functional limits [facility])        Functional: ROM is within functional limits [facility])        Muscle strength & Tone: Functionally intact  Muscle strength & Tone: Functionally intact  Sensory: Unimpaired  Sensory: Unimpaired  Palpation: No complaints of tenderness  Palpation: No complaints of tenderness   Assessment & Plan  Primary Diagnosis & Pertinent Problem List: The primary encounter diagnosis was Chronic pain. Diagnoses of Encounter for therapeutic drug level monitoring, Long term current use of opiate analgesic, Disturbance of skin sensation, Elevated sedimentation rate, Elevated C-reactive protein (CRP), Chronic hip pain (Left), Chronic knee pain (Right), Chronic low back pain (Location of Primary Source of Pain) (Bilateral) (L>R), Lumbar facet syndrome (Location of Primary Source of Pain) (Bilateral) (L>R), Lumbar foraminal stenosis (Bilateral L3-4 and L5-S1), Lumbar spinal stenosis (5 mm  Severe L3-4; 8 mm L4-5), Primary osteoarthritis of both hips, Primary osteoarthritis of both knees, and Opiate use (30 MME/Day) were also pertinent to this visit.  Visit Diagnosis: 1. Chronic pain   2. Encounter for therapeutic drug level monitoring   3. Long term current use of opiate analgesic   4. Disturbance of skin sensation   5. Elevated sedimentation rate   6. Elevated C-reactive protein (CRP)   7. Chronic hip pain (Left)   8. Chronic knee pain (Right)   9. Chronic low back pain (Location of Primary Source of Pain) (Bilateral) (L>R)   10. Lumbar facet syndrome (Location of Primary Source of Pain) (Bilateral) (L>R)   11. Lumbar foraminal stenosis (Bilateral L3-4 and L5-S1)   12. Lumbar spinal stenosis (5 mm Severe L3-4; 8 mm L4-5)   13. Primary osteoarthritis of both hips   14. Primary osteoarthritis of both knees   15. Opiate use (30 MME/Day)     Problems updated and reviewed during this visit: Problem  History of TKR (total knee replacement) (Left)  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)  Disturbance of Skin Sensation  Elevated Sedimentation Rate  Elevated C-Reactive Protein (Crp)  Chronic Kidney Disease (Ckd), Stage III ([place])  Major Depression, Single Episode, in Complete Remission ([facility])    Problem-specific Plan(s): No problem-specific assessment & plan notes found for this encounter.  No new assessment & plan notes have been filed under this hospital service since the last note was generated. Service: Pain Management   Plan of Care   Problem List Items Addressed This Visit      High   Chronic hip pain (Left) (Chronic)   Relevant Orders   HIP INJECTION   Chronic knee pain (Right) (Chronic)   Relevant Orders   KNEE INJECTION   GENICULAR NERVE BLOCK   KNEE INJECTION   Chronic low back pain (Location  of Primary Source of Pain) (Bilateral) (L>R) (Chronic)   Relevant Medications   oxyCODONE (OXY IR/ROXICODONE) 5 MG immediate release tablet   oxyCODONE  (OXY IR/ROXICODONE) 5 MG immediate release tablet   oxyCODONE (OXY IR/ROXICODONE) 5 MG immediate release tablet   Other Relevant Orders   LUMBAR FACET(MEDIAL BRANCH NERVE BLOCK) MBNB   Chronic pain - Primary (Chronic)   Relevant Medications   oxyCODONE (OXY IR/ROXICODONE) 5 MG immediate release tablet   oxyCODONE (OXY IR/ROXICODONE) 5 MG immediate release tablet   oxyCODONE (OXY IR/ROXICODONE) 5 MG immediate release tablet   Other Relevant Orders   Comprehensive metabolic panel (Completed)   C-reactive protein (Completed)   Magnesium (Completed)   Sedimentation rate (Completed)   25-Hydroxyvitamin D Lcms D2+D3   Lumbar facet syndrome (Location of Primary Source of Pain) (Bilateral) (L>R) (Chronic)   Relevant Medications   oxyCODONE (OXY IR/ROXICODONE) 5 MG immediate release tablet   oxyCODONE (OXY IR/ROXICODONE) 5 MG immediate release tablet   oxyCODONE (OXY IR/ROXICODONE) 5 MG immediate release tablet   Other Relevant Orders   LUMBAR FACET(MEDIAL BRANCH NERVE BLOCK) MBNB   Lumbar foraminal stenosis (Bilateral L3-4 and L5-S1) (Chronic)   Relevant Orders   Lumbar Transforaminal epidural without steroid   Lumbar spinal stenosis (5 mm Severe L3-4; 8 mm L4-5) (Chronic)   Relevant Orders   LUMBAR EPIDURAL STEROID INJECTION   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) (Chronic)   Relevant Medications   hydroxychloroquine (PLAQUENIL) 200 MG tablet   oxyCODONE (OXY IR/ROXICODONE) 5 MG immediate release tablet   oxyCODONE (OXY IR/ROXICODONE) 5 MG immediate release tablet   oxyCODONE (OXY IR/ROXICODONE) 5 MG immediate release tablet   Other Relevant Orders   HIP INJECTION   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) (Chronic)   Relevant Medications   hydroxychloroquine (PLAQUENIL) 200 MG tablet   oxyCODONE (OXY IR/ROXICODONE) 5 MG immediate release tablet   oxyCODONE (OXY IR/ROXICODONE) 5 MG immediate release tablet   oxyCODONE (OXY IR/ROXICODONE) 5 MG immediate release tablet   Other  Relevant Orders   KNEE INJECTION   GENICULAR NERVE BLOCK   KNEE INJECTION     Medium   Encounter for therapeutic drug level monitoring   Long term current use of opiate analgesic (Chronic)   Relevant Orders   ToxASSURE Select 13 (MW), Urine   Opiate use (30 MME/Day) (Chronic)     Low   Disturbance of skin sensation   Relevant Orders   Vitamin B12 (Completed)   Elevated C-reactive protein (CRP)   Elevated sedimentation rate       Pharmacotherapy (Medications Ordered): Meds ordered this encounter  Medications  . oxyCODONE (OXY IR/ROXICODONE) 5 MG immediate release tablet    Sig: Take 1 tablet (5 mg total) by mouth every 6 (six) hours as needed for severe pain.    Dispense:  120 tablet    Refill:  0    Do not add this medication to the electronic "Automatic Refill" notification system. Patient may have prescription filled one day early if pharmacy is closed on scheduled refill date. Do not fill until: 07/30/15 To last until: 08/29/15  . oxyCODONE (OXY IR/ROXICODONE) 5 MG immediate release tablet    Sig: Take 1 tablet (5 mg total) by mouth every 6 (six) hours as needed for severe pain.    Dispense:  120 tablet    Refill:  0    Do not add this medication to the electronic "Automatic Refill" notification system. Patient may have prescription filled one day early if pharmacy is  closed on scheduled refill date. Do not fill until: 08/29/15 To last until: 09/28/15  . oxyCODONE (OXY IR/ROXICODONE) 5 MG immediate release tablet    Sig: Take 1 tablet (5 mg total) by mouth every 6 (six) hours as needed for severe pain.    Dispense:  120 tablet    Refill:  0    Do not add this medication to the electronic "Automatic Refill" notification system. Patient may have prescription filled one day early if pharmacy is closed on scheduled refill date. Do not fill until: 09/28/15 To last until: 10/28/15    [facility] & Procedure Ordered: Orders Placed This Encounter  Procedures  . HIP INJECTION   . KNEE INJECTION  . GENICULAR NERVE BLOCK  . KNEE INJECTION  . Lumbar Transforaminal epidural without steroid  . LUMBAR EPIDURAL STEROID INJECTION  . LUMBAR FACET(MEDIAL BRANCH NERVE BLOCK) MBNB  . ToxASSURE Select 13 (MW), Urine  . Comprehensive metabolic panel  . C-reactive protein  . Magnesium  . Sedimentation rate  . Vitamin B12  . 25-Hydroxyvitamin D Lcms D2+D3    Imaging Ordered: None  Interventional Therapies: Scheduled:  None at this time.    Considering:   1. Diagnostic intra-articular left hip joint injection under fluoroscopic guidance, with or without sedation.  2. Possible left hip radiofrequency ablation under fluoroscopic guidance and IV sedation.  3. Diagnostic right intra-articular knee joint injection with local anesthetic and steroid under fluoroscopic guidance, no sedation.  4. Possible series of 5 Hyalgan right knee injections under fluoroscopic guidance, no sedation.  5. Diagnostic bilateral genicular nerve block under fluoroscopic guidance, with or without sedation.  6. Possible bilateral genicular nerve radiofrequency ablation under fluoroscopic guidance and IV sedation.  7. Diagnostic bilateral lumbar facet block under fluoroscopic guidance and IV sedation.  8. Possible bilateral lumbar facet radiofrequency ablation under fluoroscopic guidance and IV sedation.  9. Diagnostic bilateral L3-4 and/or L5-S1 transforaminal epidural steroid injections under fluoroscopic guidance, with or without IV sedation.  10. Diagnostic L3-4 versus L4-5 lumbar epidural steroid injection under fluoroscopic guidance, with or without IV sedation.    PRN Procedures:   1. Diagnostic intra-articular left hip joint injection under fluoroscopic guidance, with or without sedation.  2. Diagnostic right intra-articular knee joint injection with local anesthetic and steroid under fluoroscopic guidance, no sedation.  3. Possible series of 5 Hyalgan right knee injections under  fluoroscopic guidance, no sedation.  4. Diagnostic bilateral genicular nerve block under fluoroscopic guidance, with or without sedation.  5. Diagnostic bilateral lumbar facet block under fluoroscopic guidance and IV sedation.  6. Diagnostic bilateral L3-4 and/or L5-S1 transforaminal epidural steroid injections under fluoroscopic guidance, with or without IV sedation.  7. Diagnostic L3-4 versus L4-5 lumbar epidural steroid injection under fluoroscopic guidance, with or without IV sedation.    Referral(s) or Consult(s): None at this time.  New Prescriptions   No medications on file    Medications administered during this visit: Ms. Curto had no medications administered during this visit.  Requested PM Follow-up: Return in 3 months (on 10/12/2015) for (3-Mo), Post-MedPsych, (PRN) Procedure.  Future Appointments Date Time Provider [place]  07/22/2015 1:30 PM Elvin So, MD [facility] None  10/12/2015 11:00 AM Milinda Pointer, MD [facility] None    Primary Care Physician: Glendon Axe, MD Location: [facility] Note by: Kathlen Brunswick. Dossie Arbour, M.D, DABA, DABAPM, DABPM, DABIPP, FIPP  Pain Score Disclaimer: We use the NRS-11 scale. This is a self-reported, subjective measurement of pain severity with only modest accuracy. It  is used primarily to identify changes within a particular patient. It must be understood that outpatient pain scales are significantly less accurate that those used for research, where they can be applied under ideal controlled circumstances with minimal exposure to variables. In reality, the score is likely to be a combination of pain intensity and pain affect, where pain affect describes the degree of emotional arousal or changes in action readiness caused by the sensory experience of pain. Factors such as social and work situation, setting, emotional state, anxiety levels, expectation, and prior pain experience may influence pain  perception and show large inter-individual differences that may also be affected by time variables.  Patient instructions provided during this appointment: There are no Patient Instructions on file for this visit.

## 2015-07-16 ENCOUNTER — Ambulatory Visit: Payer: PRIVATE HEALTH INSURANCE | Admitting: Physical Therapy

## 2015-07-19 LAB — 25-HYDROXY VITAMIN D LCMS D2+D3
25-Hydroxy, Vitamin D-2: 3.6 ng/mL
25-Hydroxy, Vitamin D-3: 32 ng/mL
25-Hydroxy, Vitamin D: 36 ng/mL

## 2015-07-20 NOTE — Progress Notes (Signed)
Quick Note:   Normal fasting (NPO x 8 hours) glucose levels are between 65-99 mg/dl, with 2 hour fasting, levels are usually less than 140 mg/dl. Any random blood glucose level greater than 200 mg/dl is considered to be Diabetes.  BUN levels between 7 to 20 mg/dL (2.5 to 7.1 mmol/L) are considered normal. Elevated blood urea nitrogen can also be due to: urinary tract obstruction; congestive heart failure or recent heart attack; gastrointestinal bleeding; dehydration; shock; severe burns; certain medications, such as corticosteroids and some antibiotics; and/or a high protein diet.  Normal Creatinine levels are between 0.5 and 0.9 mg/dl for our lab. Any condition that impairs the function of the kidneys is likely to raise the creatinine level in the blood. The most common causes of longstanding (chronic) kidney disease in adults are high blood pressure and diabetes. Other causes of elevated blood creatinine levels include drugs, ingestion of a large amount of dietary meat, kidney infections, rhabdomyolysis (abnormal muscle breakdown), and urinary tract obstruction.  BUN-to-creatinine ratio >20:1 (BUN dispropertionally higher than the creatinine levels) suggests prerenal azotemia (dehydration or renal hypoperfusion), while <10:1 levels suggest renal damage.  eGFR (Estimated Glomerular Filtration Rate) results are reported as milliliters/minute/1.29m (mL/min/1.735m. Because some laboratories do not collect information on a patient's race when the sample is collected for testing, they may report calculated results for both African Americans and non-African Americans.  The [employer]s only reporting actual results once values are < 60 mL/min. 1. Normal values: 90-120 mL/min 2. Below 60 mL/min suggests that some kidney damage has occurred. 3. Between 5945nd 30 indicate (Moderate) Stage 3 kidney disease. 4. Between 29 and 15 represent (Severe) Stage 4 kidney disease. 5.  Less than 15 is considered (Kidney Failure) Stage 5. ______

## 2015-07-20 NOTE — Progress Notes (Signed)
Quick Note:  A normal sedimentation rate should be below 30 mm/hr. The sed rate is an acute phase reactant that indirectly measures the degree of inflammation present in the body. It can be acute, developing rapidly after trauma, injury or infection, for example, or can occur over an extended time (chronic) with conditions such as autoimmune diseases or cancer. The ESR is not diagnostic; it is a non-specific, screening test that may be elevated in a number of these different conditions. It provides general information about the presence or absence of an inflammatory condition. Normal levels of C-Reactive Protein for our Lab are less than 1.0 mg/L. C-reactive protein (CRP) is produced by the liver. The level of CRP rises when there is inflammation throughout the body. CRP goes up in response to inflammation. High levels suggests the presence of chronic inflammation but do not identify its location or cause. High levels have been observed in obese patients, individuals with bacterial infections, chronic inflammation, or flare-ups of inflammatory conditions. Drops of previously elevated levels suggest that the inflammation or infection is subsiding and/or responding to treatment. The combined elevation of the ESR & CRP, may be suggestive of an autoimmune disease. Should this be the case, we will inquire if the patient has had a rheumatologic evaluation looking at the RF levels, ANA levels, and CBC. ______ 

## 2015-07-21 LAB — TOXASSURE SELECT 13 (MW), URINE: PDF: 0

## 2015-07-22 ENCOUNTER — Ambulatory Visit (INDEPENDENT_AMBULATORY_CARE_PROVIDER_SITE_OTHER): Payer: Medicare Other | Admitting: Psychiatry

## 2015-07-22 DIAGNOSIS — F32A Depression, unspecified: Secondary | ICD-10-CM

## 2015-07-22 DIAGNOSIS — G4700 Insomnia, unspecified: Secondary | ICD-10-CM

## 2015-07-22 DIAGNOSIS — F329 Major depressive disorder, single episode, unspecified: Secondary | ICD-10-CM | POA: Diagnosis not present

## 2015-07-22 MED ORDER — QUETIAPINE FUMARATE 50 MG PO TABS: 50.0000 mg | ORAL_TABLET | Freq: Two times a day (BID) | ORAL | Status: DC

## 2015-07-22 MED ORDER — DULOXETINE HCL 30 MG PO CPEP: ORAL_CAPSULE | ORAL | Status: DC

## 2015-07-22 MED ORDER — QUETIAPINE FUMARATE 200 MG PO TABS: 200.0000 mg | ORAL_TABLET | Freq: Every day | ORAL | Status: DC

## 2015-07-22 NOTE — Progress Notes (Signed)
Patient ID: Laura [name], female   DOB: [birth year], 70 y.o.   MRN: [id number]  [facility] MD/PA/NP OP Progress Note  07/22/2015 1:32 PM Laura [name]  MRN:  [id number]  Subjective:  Patient is a 70 year old Caucasian female with a long history of depression and insomnia.  Reports she is doing well moodwise. States she is sleeping well and eating well. She has tolerated the decraese in Seroquel to 200mg  at bedtime. sattess he forgets to take the 50mg  of seroquel in the daytime. Having health issues with her son`s dog, but reports everything else is good. Denies any suicidal thoughts.   Visit Diagnosis:     ICD-9-CM ICD-10-CM   1. Insomnia 780.52 G47.00   2. Depression 311 F32.9     Past Medical History:  Past Medical History  Diagnosis Date  . Anxiety   . Depression   . Chronic kidney disease   . Thyroid disease   . Allergy   . Sleep apnea   . Arthritis, degenerative 10/08/2013    Overview:    a.  Lumbar spine/spinal stenosis/foot drop.   b.  Hands.     Past Surgical History  Procedure Laterality Date  . Abdominal hysterectomy    . Cesarean section    . Replacement total knee Left   . Hip surgery Right    Family History:  Family History  Problem Relation Age of Onset  . Hypertension Mother   . Stroke Mother   . Heart attack Father   . Alcohol abuse Father   . Depression Father   . Post-traumatic stress disorder Father   . Rheum arthritis Sister   . Hypertension Sister   . Depression Sister    Social History:  Social History   Social History  . Marital Status: Married    Spouse Name: N/A  . Number of Children: N/A  . Years of Education: N/A   Social History Main Topics  . Smoking status: Never Smoker   . Smokeless tobacco: Never Used  . Alcohol Use: No  . Drug Use: No  . Sexual Activity: Not Currently   Other Topics Concern  . Not on file   Social History Narrative   Additional History:   Assessment:   Musculoskeletal: Strength & Muscle Tone:  within normal limits Gait & Station: normal Patient leans: N/A  Psychiatric Specialty Exam: HPI  Review of Systems  Psychiatric/Behavioral: Negative for depression, suicidal ideas, hallucinations, memory loss and substance abuse. The patient is not nervous/anxious and does not have insomnia (Avis complaints have improved and she attributes it to  Seroquel).   All other systems reviewed and are negative.    There were no vitals taken for this visit.There is no weight on file to calculate BMI.  General Appearance: Well Groomed  Eye Contact:  Good  Speech:  Normal Rate  Volume:  Normal  Mood:  Good  Affect:  Congruent  Thought Process:  Linear and Logical  Orientation:  Full (Time, Place, and Person)  Thought Content:  Negative  Suicidal Thoughts:  No  Homicidal Thoughts:  No  Memory:  Immediate;   Good Recent;   Good Remote;   Good  Judgement:  Good  Insight:  Good  Psychomotor Activity:  Negative  Concentration:  Good  Recall:  Good  Fund of Knowledge: Good  Language: Good  Akathisia:  Negative  Handed:    AIMS (if indicated):  N/A  Assets:  Communication Skills Desire for Improvement Social Support  ADL's:  Intact  Cognition:  WNL  Sleep:  fair   Is the patient at risk to self?  No. Has the patient been a risk to self in the past 6 months?  No. Has the patient been a risk to self within the distant past?  No. Is the patient a risk to others?  No. Has the patient been a risk to others in the past 6 months?  No. Has the patient been a risk to others within the distant past?  No.  Current Medications: Current Outpatient Prescriptions  Medication Sig Dispense Refill  . amLODipine (NORVASC) 10 MG tablet Take 5 mg by mouth 2 (two) times daily.   11  . aspirin EC 81 MG tablet Take by mouth daily.     . Cholecalciferol (VITAMIN D) 2000 UNITS tablet Take by mouth daily.     . DULoxetine (CYMBALTA) 30 MG capsule Two tablets in the morning, and one in the evening. 90  capsule 0  . esomeprazole (NEXIUM) 20 MG capsule Take by mouth daily.     . furosemide (LASIX) 40 MG tablet TAKE 1 TABLET ONCE DAILY    . hydroxychloroquine (PLAQUENIL) 200 MG tablet Reported on 07/15/2015    . hydroxychloroquine (PLAQUENIL) 200 MG tablet Take 400 mg by mouth daily.    Marland Kitchen levothyroxine (SYNTHROID, LEVOTHROID) 200 MCG tablet TAKE 1 TABLET ONCE DAILY- ON AN EMPTY STOMACH WITH A GLASS OF WATER 30-60 MINUTES BEFORE BREAKFAST  5  . levothyroxine (SYNTHROID, LEVOTHROID) 25 MCG tablet TAKE 1 TABLET BY MOUTH EVERY DAY (TAKE WITH 20O MCG TABLET TO EQUAL A 225 MCG DOSE)  5  . loratadine (CLARITIN) 10 MG tablet Take by mouth daily as needed.     . metoprolol succinate (TOPROL-XL) 50 MG 24 hr tablet TAKE 1 TABLET (50 MG TOTAL) BY MOUTH ONCE DAILY.  1  . oxyCODONE (OXY IR/ROXICODONE) 5 MG immediate release tablet Take 1 tablet (5 mg total) by mouth every 6 (six) hours as needed for severe pain. 120 tablet 0  . oxyCODONE (OXY IR/ROXICODONE) 5 MG immediate release tablet Take 1 tablet (5 mg total) by mouth every 6 (six) hours as needed for severe pain. 120 tablet 0  . oxyCODONE (OXY IR/ROXICODONE) 5 MG immediate release tablet Take 1 tablet (5 mg total) by mouth every 6 (six) hours as needed for severe pain. 120 tablet 0  . QUEtiapine (SEROQUEL) 200 MG tablet Take 1 tablet (200 mg total) by mouth at bedtime. 90 tablet 0  . QUEtiapine (SEROQUEL) 50 MG tablet Take 1 tablet (50 mg total) by mouth 2 (two) times daily. (Patient not taking: Reported on 07/15/2015) 180 tablet 1  . spironolactone (ALDACTONE) 25 MG tablet TAKE 1/2 (ONE-HALF) TABLET BY MOUTH DAILY  11  . traMADol (ULTRAM) 50 MG tablet Reported on 07/15/2015  0   No current facility-administered medications for this visit.    Medical Decision Making:  Established Problem, Stable/Improving (1), Review of Medication Regimen & Side Effects (2) and Review of New Medication or Change in Dosage (2)  Treatment Plan Summary:Medication management and  Plan    Major depressive disorder, recurrent, moderate-patient will continue her Cymbalta 60 mg in the morning and 30 mg in the evening.   Insomnia- Continue Seroquel at 200 mg at bedtime.   Anxiety- take Seroquel at 50 mg twice daily. If she gets too sedated in the morning to shift change that to bedtime.  RTC to clinic in 3 months time or call before if necessary.  Laura [name] 07/22/2015, 1:32 PM

## 2015-07-23 ENCOUNTER — Encounter: Payer: PRIVATE HEALTH INSURANCE | Admitting: Pain Medicine

## 2015-08-12 ENCOUNTER — Telehealth: Payer: Self-pay

## 2015-08-12 NOTE — Telephone Encounter (Signed)
Pt says Dr Laban Emperor told her he would write a script for prednisone if her bursitis came back. Pt wants to know will he still do that since it has returned.

## 2015-09-24 ENCOUNTER — Telehealth: Payer: Self-pay | Admitting: *Deleted

## 2015-09-24 ENCOUNTER — Other Ambulatory Visit: Payer: Self-pay | Admitting: Pain Medicine

## 2015-09-24 ENCOUNTER — Encounter: Payer: Self-pay | Admitting: Pain Medicine

## 2015-09-24 DIAGNOSIS — F411 Generalized anxiety disorder: Secondary | ICD-10-CM | POA: Insufficient documentation

## 2015-09-24 DIAGNOSIS — G8929 Other chronic pain: Secondary | ICD-10-CM

## 2015-09-24 DIAGNOSIS — M545 Low back pain, unspecified: Secondary | ICD-10-CM

## 2015-09-24 MED ORDER — METHYLPREDNISOLONE 4 MG PO TBPK: ORAL_TABLET | ORAL | 0 refills | Status: DC

## 2015-09-24 NOTE — Telephone Encounter (Signed)
Patient called to let her know the prednisone Rx has been called in.  Patient instructed to watch her blood sugar and to schedule appt with Dr Laban Emperor for evaluation.

## 2015-10-03 DIAGNOSIS — E119 Type 2 diabetes mellitus without complications: Secondary | ICD-10-CM | POA: Insufficient documentation

## 2015-10-04 ENCOUNTER — Other Ambulatory Visit: Payer: Self-pay | Admitting: Psychiatry

## 2015-10-12 ENCOUNTER — Ambulatory Visit (INDEPENDENT_AMBULATORY_CARE_PROVIDER_SITE_OTHER): Payer: Medicare Other | Admitting: Psychiatry

## 2015-10-12 ENCOUNTER — Encounter: Payer: PRIVATE HEALTH INSURANCE | Admitting: Pain Medicine

## 2015-10-12 ENCOUNTER — Encounter: Payer: Self-pay | Admitting: Psychiatry

## 2015-10-12 VITALS — BP 146/84 | HR 101 | Temp 98.4°F | Wt 280.4 lb

## 2015-10-12 DIAGNOSIS — F5101 Primary insomnia: Secondary | ICD-10-CM | POA: Diagnosis not present

## 2015-10-12 DIAGNOSIS — F3340 Major depressive disorder, recurrent, in remission, unspecified: Secondary | ICD-10-CM | POA: Diagnosis not present

## 2015-10-12 MED ORDER — QUETIAPINE FUMARATE 50 MG PO TABS: 50.0000 mg | ORAL_TABLET | Freq: Two times a day (BID) | ORAL | 1 refills | Status: DC

## 2015-10-12 MED ORDER — QUETIAPINE FUMARATE 200 MG PO TABS: 200.0000 mg | ORAL_TABLET | Freq: Every day | ORAL | 0 refills | Status: DC

## 2015-10-12 MED ORDER — DULOXETINE HCL 30 MG PO CPEP: ORAL_CAPSULE | ORAL | 2 refills | Status: DC

## 2015-10-12 NOTE — Progress Notes (Signed)
Patient ID: Laura [name], female   DOB: August 12, 1945, 70 y.o.   MRN: [id number]  [facility] MD/PA/NP OP Progress Note  10/12/2015 1:19 PM Gracyn Ancona  MRN:  [id number]  Subjective:  Patient is a 70 year old Caucasian female with a long history of depression and insomnia.  Reports she is doing well moodwise.  States she is sleeping well and eating well. She enjoys spending time with her grandchildren. Patient reports that she recently had flareups of her arthritis and that interferes with her functioning. Patient also taking multiple pain medications and states she takes the hydrocodone about 2-3 times daily. Discussed with patient the to taper off the hydrocodone and look into other options. Denies any suicidal thoughts.   Visit Diagnosis:     ICD-9-CM ICD-10-CM   1. Recurrent major depressive disorder, in remission (HCC) 296.35 F33.40   2. 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 Allergy   . Anxiety   . Arthritis, degenerative 10/08/2013   Overview:    a.  Lumbar spine/spinal stenosis/foot drop.   b.  Hands.   . Chronic kidney disease   . Depression   . 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 Major depression, single episode, in complete remission (HCC) 06/25/2015  . Memory loss, short term 03/19/2014  . Seizure (HCC) 10/07/2014  . Sleep apnea   . Thyroid disease     Past Surgical History:  Procedure Laterality Date  . ABDOMINAL HYSTERECTOMY    . CESAREAN SECTION    . HIP SURGERY Right   . REPLACEMENT TOTAL KNEE Left    Family History:  Family History  Problem Relation Age of Onset  . Hypertension Mother   . Stroke Mother   . Heart attack Father   . Alcohol abuse Father   . Depression Father   . Post-traumatic stress disorder Father   . Rheum arthritis Sister   . Hypertension Sister   . Depression Sister    Social History:  Social History   Social History  . Marital status: Married     Spouse name: N/A  . Number of children: N/A  . Years of education: N/A   Social History Main Topics  . Smoking status: Never Smoker  . Smokeless tobacco: Never Used  . Alcohol use No  . Drug use: No  . Sexual activity: Not Currently   Other Topics Concern  . None   Social History Narrative  . None   Additional History:   Assessment:   Musculoskeletal: Strength & Muscle Tone: within normal limits Gait & Station: normal Patient leans: N/A  Psychiatric Specialty Exam: HPI  Review of Systems  Psychiatric/Behavioral: Negative for depression, hallucinations, memory loss, substance abuse and suicidal ideas. The patient is not nervous/anxious and does not have insomnia (Avis complaints have improved and she attributes it to  Seroquel).   All other systems reviewed and are negative.    There were no vitals taken for this visit.There is no height or weight on file to calculate BMI.  General Appearance: Well Groomed  Eye Contact:  Good  Speech:  Normal Rate  Volume:  Normal  Mood:  Good  Affect:  Congruent  Thought Process:  Linear and Logical  Orientation:  Full (Time, Place, and Person)  Thought Content:  Negative  Suicidal Thoughts:  No  Homicidal Thoughts:  No  Memory:  Immediate;   Good Recent;   Good Remote;   Good  Judgement:  Good  Insight:  Good  Psychomotor Activity:  Negative  Concentration:  Good  Recall:  Good  Fund of Knowledge: Good  Language: Good  Akathisia:  Negative  Handed:    AIMS (if indicated):  N/A  Assets:  Communication Skills Desire for Improvement Social Support  ADL's:  Intact  Cognition: WNL  Sleep:  fair   Is the patient at risk to self?  No. Has the patient been a risk to self in the past 6 months?  No. Has the patient been a risk to self within the distant past?  No. Is the patient a risk to others?  No. Has the patient been a risk to others in the past 6 months?  No. Has the patient been a risk to others within the  distant past?  No.  Current Medications: Current Outpatient Prescriptions  Medication Sig Dispense Refill  . amLODipine (NORVASC) 10 MG tablet Take 5 mg by mouth 2 (two) times daily.   11  . aspirin EC 81 MG tablet Take by mouth daily.     . Cholecalciferol (VITAMIN D) 2000 UNITS tablet Take by mouth daily.     . DULoxetine (CYMBALTA) 30 MG capsule Two tablets in the morning, and one in the evening. 90 capsule 2  . esomeprazole (NEXIUM) 20 MG capsule Take by mouth daily.     . furosemide (LASIX) 40 MG tablet TAKE 1 TABLET ONCE DAILY    . hydroxychloroquine (PLAQUENIL) 200 MG tablet Reported on 07/15/2015    . hydroxychloroquine (PLAQUENIL) 200 MG tablet Take 400 mg by mouth daily.    Marland Kitchen levothyroxine (SYNTHROID, LEVOTHROID) 200 MCG tablet TAKE 1 TABLET ONCE DAILY- ON AN EMPTY STOMACH WITH A GLASS OF WATER 30-60 MINUTES BEFORE BREAKFAST  5  . levothyroxine (SYNTHROID, LEVOTHROID) 25 MCG tablet TAKE 1 TABLET BY MOUTH EVERY DAY (TAKE WITH 20O MCG TABLET TO EQUAL A 225 MCG DOSE)  5  . loratadine (CLARITIN) 10 MG tablet Take by mouth daily as needed.     . methylPREDNISolone (MEDROL) 4 MG TBPK tablet Follow package instructions. 21 tablet 0  . metoprolol succinate (TOPROL-XL) 50 MG 24 hr tablet TAKE 1 TABLET (50 MG TOTAL) BY MOUTH ONCE DAILY.  1  . oxyCODONE (OXY IR/ROXICODONE) 5 MG immediate release tablet Take 1 tablet (5 mg total) by mouth every 6 (six) hours as needed for severe pain. 120 tablet 0  . oxyCODONE (OXY IR/ROXICODONE) 5 MG immediate release tablet Take 1 tablet (5 mg total) by mouth every 6 (six) hours as needed for severe pain. 120 tablet 0  . oxyCODONE (OXY IR/ROXICODONE) 5 MG immediate release tablet Take 1 tablet (5 mg total) by mouth every 6 (six) hours as needed for severe pain. 120 tablet 0  . QUEtiapine (SEROQUEL) 200 MG tablet Take 1 tablet (200 mg total) by mouth at bedtime. 90 tablet 0  . QUEtiapine (SEROQUEL) 50 MG tablet Take 1 tablet (50 mg total) by mouth 2 (two) times  daily. 180 tablet 1  . spironolactone (ALDACTONE) 25 MG tablet TAKE 1/2 (ONE-HALF) TABLET BY MOUTH DAILY  11  . traMADol (ULTRAM) 50 MG tablet Reported on 07/15/2015  0   No current facility-administered medications for this visit.     Medical Decision Making:  Established Problem, Stable/Improving (1), Review of Medication Regimen & Side Effects (2) and Review of New Medication or Change in Dosage (2)  Treatment Plan Summary:Medication management and Plan    Major depressive disorder, recurrent, moderate-patient will  continue her Cymbalta 60 mg in the morning and 30 mg in the evening.   Insomnia- Continue Seroquel at 200 mg at bedtime.   Anxiety- take Seroquel at 50 mg twice daily. If she gets too sedated in the morning to shift change that to bedtime.  RTC to clinic in 3 months time or call before if necessary.  Yitta Gongaware 10/12/2015, 1:19 PM

## 2015-10-15 ENCOUNTER — Ambulatory Visit (HOSPITAL_BASED_OUTPATIENT_CLINIC_OR_DEPARTMENT_OTHER): Payer: Medicare Other | Admitting: Pain Medicine

## 2015-10-15 ENCOUNTER — Encounter: Payer: Self-pay | Admitting: Pain Medicine

## 2015-10-15 ENCOUNTER — Ambulatory Visit
Admission: RE | Admit: 2015-10-15 | Discharge: 2015-10-15 | Disposition: A | Discharge status: Home / Self Care | Payer: Medicare Other | Source: Ambulatory Visit | Attending: Pain Medicine | Admitting: Pain Medicine

## 2015-10-15 VITALS — BP 132/65 | HR 92 | Temp 98.4°F | Resp 16 | Ht 65.0 in | Wt 272.0 lb

## 2015-10-15 DIAGNOSIS — G894 Chronic pain syndrome: Secondary | ICD-10-CM

## 2015-10-15 DIAGNOSIS — M25561 Pain in right knee: Secondary | ICD-10-CM

## 2015-10-15 DIAGNOSIS — G8929 Other chronic pain: Secondary | ICD-10-CM

## 2015-10-15 DIAGNOSIS — M170 Bilateral primary osteoarthritis of knee: Secondary | ICD-10-CM | POA: Diagnosis not present

## 2015-10-15 MED ORDER — OXYCODONE HCL 5 MG PO TABS: 5.0000 mg | ORAL_TABLET | Freq: Four times a day (QID) | ORAL | 0 refills | Status: DC | PRN

## 2015-10-15 MED ORDER — LIDOCAINE HCL (PF) 1 % IJ SOLN
10.0000 mL | Freq: Once | INTRAMUSCULAR | Status: AC
  Administered 2015-10-15: 10 mL
  Filled 2015-10-15: qty 10

## 2015-10-15 MED ORDER — METHYLPREDNISOLONE ACETATE 80 MG/ML IJ SUSP
80.0000 mg | Freq: Once | INTRAMUSCULAR | Status: AC
  Administered 2015-10-15: 80 mg
  Filled 2015-10-15: qty 1

## 2015-10-15 MED ORDER — ROPIVACAINE HCL 2 MG/ML IJ SOLN
5.0000 mL | Freq: Once | INTRAMUSCULAR | Status: AC
  Administered 2015-10-15: 5 mL
  Filled 2015-10-15: qty 10

## 2015-10-15 NOTE — Patient Instructions (Signed)

## 2015-10-15 NOTE — Progress Notes (Signed)
[name]  MRN: [id number]  Referring Provider: Glendon Axe, MD  DOB: [birth year]  PCP: Glendon Axe, MD  DOS: 10/15/2015  Note by: Kathlen Brunswick. Dossie Arbour, MD  Service setting: Ambulatory outpatient  Location: [facility] (AMB) Pain Management Facility  Visit type: Procedure  Specialty: Interventional Pain Management  Patient type: Established   Primary Reason for Visit: Interventional Pain Management Treatment. CC: Knee Pain (right)  Procedure:  Anesthesia, Analgesia, Anxiolysis:  Type: Therapeutic Intra-Articular Steroid Knee Injection Region: Lateral  Knee Region Level: Knee Joint Laterality: Right  Type: Local Anesthesia Local Anesthetic: Lidocaine 1% Route: Infiltration ([place]/IM) IV Access: Declined Sedation: Declined  Indication(s): Analgesia        Indications: 1. Primary osteoarthritis of both knees   2. Chronic knee pain (Right)   3. Chronic pain syndrome    Pain Score: Pre-procedure: 4 /10 Post-procedure: 0-No pain/10  Pre-Procedure Assessment:  Laura [name] is a 70 y.o. (year old), female patient, seen today for interventional treatment. She  has a past surgical history that includes Abdominal hysterectomy; Cesarean section; Replacement total knee (Left); and Hip surgery (Right).. Her primarily concern today is the Knee Pain (right) The primary encounter diagnosis was Primary osteoarthritis of both knees. Diagnoses of Chronic knee pain (Right) and Chronic pain syndrome were also pertinent to this visit.  Pain Descriptors / Indicators: Sharp Pain Frequency: Intermittent   Controlled Substance Pharmacotherapy Assessment REMS (Risk Evaluation and Mitigation Strategy)  Analgesic: Oxycodone IR 5 mg every 6 hours (20 mg/day) MME/day: 30 mg/day Pill Count: Oxycodone 77m #39 of 120 filled on 09-28-2015. Pharmacokinetics: Onset of action (Liberation/Absorption): Within expected pharmacological parameters Time to Peak effect (Distribution): Timing and results  are as within normal expected parameters Duration of action (Metabolism/Excretion): Within normal limits for medication Pharmacodynamics: Analgesic Effect: More than 50% Activity Facilitation: Medication(s) allow patient to sit, stand, walk, and do the basic ADLs Perceived Effectiveness: Described as relatively effective, allowing for increase in activities of daily living (ADL) Side-effects or Adverse reactions: None reported Monitoring: [place] PMP: Online review of the past 153-montheriod conducted. Compliant with practice rules and regulations List of all UDS test(s) done:  Lab Results  Component Value Date   TOXASSSELUR FINAL 07/15/2015   TOXASSSELUR FINAL 05/01/2015   TOXASSSELUR FINAL 11/05/2014   Last UDS on record: ToxAssure Select 13  Date Value Ref Range Status  07/15/2015 FINAL  Final    Comment:    ==================================================================== TOXASSURE SELECT 13 (MW) ==================================================================== Test                             Result       Flag       Units Drug Present and Declared for Prescription Verification   Oxycodone                      806          EXPECTED   ng/mg creat   Oxymorphone                    220          EXPECTED   ng/mg creat   Noroxycodone                   4112         EXPECTED   ng/mg creat   Noroxymorphone  170          EXPECTED   ng/mg creat    Sources of oxycodone are scheduled prescription medications.    Oxymorphone, noroxycodone, and noroxymorphone are expected    metabolites of oxycodone. Oxymorphone is also available as a    scheduled prescription medication.   Tramadol                       PRESENT      EXPECTED   O-Desmethyltramadol            PRESENT      EXPECTED   N-Desmethyltramadol            PRESENT      EXPECTED    Source of tramadol is a prescription medication.    O-desmethyltramadol and N-desmethyltramadol are expected    metabolites of  tramadol. ==================================================================== Test                      Result    Flag   Units      Ref Range   Creatinine              102              mg/dL      >=20 ==================================================================== Declared Medications:  The flagging and interpretation on this report are based on the  following declared medications.  Unexpected results may arise from  inaccuracies in the declared medications.  **Note: The testing scope of this panel includes these medications:  Oxycodone (Roxicodone)  Tramadol (Ultram)  **Note: The testing scope of this panel does not include following  reported medications:  Amlodipine (Norvasc)  Aspirin (Aspirin 81)  Cholecalciferol  Duloxetine (Cymbalta)  Furosemide (Lasix)  Hydroxychloroquine (Plaquenil)  Levothyroxine  Loratadine  Metoprolol  Omeprazole (Nexium)  Quetiapine (Seroquel)  Spironolactone (Aldactone) ==================================================================== For clinical consultation, please call [phone]. ====================================================================    UDS interpretation: Compliant          Medication Assessment Form: Reviewed. Patient indicates being compliant with therapy Treatment compliance: Compliant Risk Assessment: Aberrant/High Risk Behavior: None observed or detected today Opioid Fatal Overdose Risk Factors: None identified today Non-fatal overdose hazard ratio (HR): Calculation deferred Fatal overdose hazard ratio (HR): Calculation deferred Substance Use Disorder (SUD) Risk Level: Pending results of Medical Psychology Evaluation for SUD Opioid Risk Tool (ORT) Score:             Depression Scale Score: PHQ-2: 0   No depression (0) PHQ-9: 0   No depression (0-4) Risk Mitigation Strategies:  Patient Counseling:  Completed Patient-Prescriber Agreement (PPA): Present and active  Notification to other healthcare  providers: Done   Pharmacologic Plan: No change in therapy, at this time  Coagulation Parameters Lab Results  Component Value Date   PLT 251 06/12/2013   Verification of the correct person, correct site (including marking of site), and correct procedure were performed and confirmed by the patient.  Consent: Before the procedure and under the influence of no sedative(s), amnesic(s), or anxiolytics, the patient was informed of the treatment options, risks and possible complications. To fulfill our ethical and legal obligations, as recommended by the American Medical Association's Code of Ethics, I have informed the patient of my clinical impression; the nature and purpose of the treatment or procedure; the risks, benefits, and possible complications of the intervention; the alternatives, including doing nothing; the risk(s) and benefit(s) of the alternative treatment(s) or procedure(s); and the risk(s) and benefit(s)  of doing nothing. The patient was provided information about the general risks and possible complications associated with the procedure. These may include, but are not limited to: failure to achieve desired goals, infection, bleeding, organ or nerve damage, allergic reactions, paralysis, and death. In addition, the patient was informed of those risks and complications associated to Spine-related procedures, such as failure to decrease pain; infection (i.e.: Meningitis, epidural or intraspinal abscess); bleeding (i.e.: epidural hematoma, subarachnoid hemorrhage, or any other type of intraspinal or peri-dural bleeding); organ or nerve damage (i.e.: Any type of peripheral nerve, nerve root, or spinal cord injury) with subsequent damage to sensory, motor, and/or autonomic systems, resulting in permanent pain, numbness, and/or weakness of one or several areas of the body; allergic reactions; (i.e.: anaphylactic reaction); and/or death. Furthermore, the patient was informed of those risks and  complications associated with the medications. These include, but are not limited to: allergic reactions (i.e.: anaphylactic or anaphylactoid reaction(s)); adrenal axis suppression; blood sugar elevation that in diabetics may result in ketoacidosis or comma; water retention that in patients with history of congestive heart failure may result in shortness of breath, pulmonary edema, and decompensation with resultant heart failure; weight gain; swelling or edema; medication-induced neural toxicity; particulate matter embolism and blood vessel occlusion with resultant organ, and/or nervous system infarction; and/or aseptic necrosis of one or more joints. Finally, the patient was informed that Medicine is not an exact science; therefore, there is also the possibility of unforeseen or unpredictable risks and/or possible complications that may result in a catastrophic outcome. The patient indicated having understood very clearly. We have given the patient no guarantees and we have made no promises. Enough time was given to the patient to ask questions, all of which were answered to the patient's satisfaction. Ms. Botello has indicated that she wanted to continue with the procedure.  Consent Attestation: I, the ordering provider, attest that I have discussed with the patient the benefits, risks, side-effects, alternatives, likelihood of achieving goals, and potential problems during recovery for the procedure that I have provided informed consent.  Pre-Procedure Preparation:  Safety Precautions: Allergies reviewed. The patient was asked about blood thinners, or active infections, both of which were denied. The patient was asked to confirm the procedure and laterality, before marking the site, and again before commencing the procedure. Appropriate site, procedure, and patient were confirmed by following the Joint Commission's Universal Protocol (UP.01.01.01), in the form of a "Time Out". The patient was asked to  participate by confirming the accuracy of the "Time Out" information. Patient was assessed for positional comfort and pressure points before starting the procedure. Allergies: She is allergic to penicillins.. Allergy Precautions: None required Infection Control Precautions: Sterile technique used. Standard Universal Precautions were taken as recommended by the Department of [facility] for Disease Control and Prevention (CDC). Standard pre-surgical skin prep was conducted. Respiratory hygiene and cough etiquette was practiced. Hand hygiene observed. Safe injection practices and needle disposal techniques followed. SDV (single dose vial) medications used. Medications properly checked for expiration dates and contaminants. Personal protective equipment (PPE) used as per protocol. Monitoring:  As per clinic protocol. Vitals:   10/15/15 1319 10/15/15 1357 10/15/15 1400 10/15/15 1407  BP: (!) 111/91 128/87 (!) 148/91 132/65  Pulse: 66 75 73 92  Resp: '16 14 15 16  ' Temp: 98.4 F (36.9 C)     TempSrc: Oral     SpO2: 100% 95% 97% 98%  Weight: 272 lb (123.4 kg)     Height: 5'  5" (1.651 m)     Calculated BMI: Body mass index is 45.26 kg/m. Time-out: "Time-out" completed before starting procedure, as per protocol.  Description of Procedure Process:   Time-out: "Time-out" completed before starting procedure, as per protocol. Position: Sitting Target Area: Knee Joint Approach: Lateral approach. Area Prepped: Entire knee area, from the mid-thigh to the mid-shin. Prepping solution: ChloraPrep (2% chlorhexidine gluconate and 70% isopropyl alcohol) Safety Precautions: Aspiration looking for blood return was conducted prior to all injections. At no point did we inject any substances, as a needle was being advanced. No attempts were made at seeking any paresthesias. Safe injection practices and needle disposal techniques used. Medications properly checked for expiration dates. SDV (single dose vial)  medications used.    Description of the Procedure: Protocol guidelines were followed. The patient was placed in position over the fluoroscopy table. The target area was identified and the area prepped in the usual manner. Skin desensitized using vapocoolant spray. Skin & deeper tissues infiltrated with local anesthetic. Appropriate amount of time allowed to pass for local anesthetics to take effect. The procedure needles were then advanced to the target area. Proper needle placement secured. Negative aspiration confirmed. Solution injected in intermittent fashion, asking for systemic symptoms every 0.5cc of injectate. The needles were then removed and the area cleansed, making sure to leave some of the prepping solution back to take advantage of its long term bactericidal properties. EBL: None Materials & Medications Used:  Needle(s) Used: 22g - 1.5" Needle(s) Medications Administered today: We administered methylPREDNISolone acetate, lidocaine (PF), and ropivacaine (PF) 2 mg/ml (0.2%).Please see chart orders for dosing details.  Imaging Guidance:   Type of Imaging Technique: Fluoroscopy Guidance (Non-spinal) Indication(s): Assistance in needle guidance and placement for procedures requiring needle placement in or near specific anatomical locations not easily accessible without such assistance. Exposure Time: Please see nurses notes. Contrast: None used. Fluoroscopic Guidance: I was personally present in the fluoroscopy suite, where the patient was placed in position for the procedure, over the fluoroscopy-compatible table. Fluoroscopy was manipulated, using "Tunnel Vision Technique", to obtain the best possible view of the target area, on the affected side. Parallax error was corrected before commencing the procedure. A "direction-depth-direction" technique was used to introduce the needle under continuous pulsed fluoroscopic guidance. Once the target was reached, antero-posterior, oblique, and lateral  fluoroscopic projection views were taken to confirm needle placement in all planes. Permanently recorded images stored by scanning into EMR. Interpretation: Intraoperative imaging interpretation by performing Physician. Adequate needle placement confirmed. No contrast injected. Permanent hardcopy images in multiple planes scanned into the patient's record.  Antibiotic Prophylaxis:  Indication(s): No indications identified. Type:  Antibiotics Given (last 72 hours)    None       Post-operative Assessment:   Complications: No immediate post-treatment complications were observed. Disposition: Return to clinic for follow-up evaluation. The patient tolerated the entire procedure well. A repeat set of vitals were taken after the procedure and the patient was kept under observation following institutional policy, for this type of procedure. The patient was discharged home, once institutional criteria were met. The patient was provided with post-procedure discharge instructions, including a section on how to identify potential problems. Should any problems arise concerning this procedure, the patient was given instructions to immediately contact us, at any time, without hesitation. In any case, we plan to contact the patient by telephone for a follow-up status report regarding this interventional procedure. Comments:  No additional relevant information.  Plan of Care  Problem List Items Addressed This Visit      High   Chronic knee pain (Right) (Chronic)   Chronic pain syndrome (Chronic)   Relevant Medications   oxyCODONE (OXY IR/ROXICODONE) 5 MG immediate release tablet (Start on 10/28/2015)   oxyCODONE (OXY IR/ROXICODONE) 5 MG immediate release tablet (Start on 11/27/2015)   oxyCODONE (OXY IR/ROXICODONE) 5 MG immediate release tablet (Start on 12/27/2015)   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) - Primary (Chronic)   Relevant Medications   methotrexate (RHEUMATREX) 10 MG tablet   oxyCODONE  (OXY IR/ROXICODONE) 5 MG immediate release tablet (Start on 10/28/2015)   oxyCODONE (OXY IR/ROXICODONE) 5 MG immediate release tablet (Start on 11/27/2015)   oxyCODONE (OXY IR/ROXICODONE) 5 MG immediate release tablet (Start on 12/27/2015)   methylPREDNISolone acetate (DEPO-MEDROL) injection 80 mg (Completed)   lidocaine (PF) (XYLOCAINE) 1 % injection 10 mL (Completed)   ropivacaine (PF) 2 mg/ml (0.2%) (NAROPIN) epidural 5 mL (Completed)   Other Relevant Orders   KNEE INJECTION   DG C-Arm 1-60 Min-No Report (Completed)    Other Visit Diagnoses   None.     Requested PM Follow-up: Return in 3 months (on 01/19/2016) for Med-Mgmt.  Future Appointments Date Time Provider [place]  01/14/2016 8:30 AM Milinda Pointer, MD [facility] None    Primary Care Physician: Glendon Axe, MD Location: [facility] Outpatient Pain Management Facility Note by: Kathlen Brunswick. Dossie Arbour, M.D, DABA, DABAPM, DABPM, DABIPP, FIPP  Disclaimer:  Medicine is not an exact science. The only guarantee in medicine is that nothing is guaranteed. It is important to note that the decision to proceed with this intervention was based on the information collected from the patient. The Data and conclusions were drawn from the patient's questionnaire, the interview, and the physical examination. Because the information was provided in large part by the patient, it cannot be guaranteed that it has not been purposely or unconsciously manipulated. Every effort has been made to obtain as much relevant data as possible for this evaluation. It is important to note that the conclusions that lead to this procedure are derived in large part from the available data. Always take into account that the treatment will also be dependent on availability of resources and existing treatment guidelines, considered by other Pain Management Practitioners as being common knowledge and practice, at the time of the intervention. For Medico-Legal purposes, it  is also important to point out that variation in procedural techniques and pharmacological choices are the acceptable norm. The indications, contraindications, technique, and results of the above procedure should only be interpreted and judged by a Board-Certified Interventional Pain Specialist with extensive familiarity and expertise in the same exact procedure and technique. Attempts at providing opinions without similar or greater experience and expertise than that of the treating physician will be considered as inappropriate and unethical, and shall result in a formal complaint to the state medical board and applicable specialty societies. Daisyreviewed the middle ear

## 2015-10-15 NOTE — Progress Notes (Signed)
Safety precautions to be maintained throughout the outpatient stay will include: orient to surroundings, keep bed in low position, maintain call bell within reach at all times, provide assistance with transfer out of bed and ambulation.      Oxycodone 5mg  #39 of 120 filled on 09-28-2015 as counted today per 09-30-2015 RN

## 2015-10-16 ENCOUNTER — Telehealth: Payer: Self-pay

## 2015-10-16 NOTE — Telephone Encounter (Signed)
Post procedure phone call. Patient states she is doing good.  

## 2015-10-26 ENCOUNTER — Other Ambulatory Visit: Payer: Self-pay | Admitting: Internal Medicine

## 2015-10-26 DIAGNOSIS — Z1231 Encounter for screening mammogram for malignant neoplasm of breast: Secondary | ICD-10-CM

## 2015-11-03 ENCOUNTER — Telehealth: Payer: Self-pay

## 2015-11-03 NOTE — Telephone Encounter (Signed)
received a fax requesting a 90 day supply of the cymbalta 30mg  .received a fax requesting a 90 day supply of the cymbalta 30mg  .

## 2015-11-03 NOTE — Telephone Encounter (Signed)
left message on doctor's line that it was ok to give patient a 90 day supply of medication with no additional refills.

## 2015-11-11 ENCOUNTER — Telehealth: Payer: Self-pay | Admitting: Pain Medicine

## 2015-11-11 NOTE — Telephone Encounter (Signed)
Please call patient regarding meds her pcp wants to give her for sickness, coughing. Wants to know if she should get cough syrup

## 2015-11-11 NOTE — Telephone Encounter (Signed)
Spoke with patient who has been to see her PCP d/t being sick with URI.  PA wanted to prescribe her a cough medicine that had codeine in it and she denied the cough medicine because of her pain contract.  PA made aware of contract as well.  Patient informed that as long the person providing care is aware of what she is taking and feels that it is prudent to prescribe a certain medication that Dr Laban Emperor is fine with that.  Also, letting us know this is happening is necessary.  Patient verbalizes u/o information.

## 2015-11-26 ENCOUNTER — Ambulatory Visit
Admission: RE | Admit: 2015-11-26 | Discharge: 2015-11-26 | Disposition: A | Discharge status: Home / Self Care | Payer: Medicare Other | Source: Ambulatory Visit | Attending: Internal Medicine | Admitting: Internal Medicine

## 2015-11-26 DIAGNOSIS — Z1231 Encounter for screening mammogram for malignant neoplasm of breast: Secondary | ICD-10-CM | POA: Insufficient documentation

## 2015-11-26 IMAGING — MG MM DIGITAL SCREENING BILAT W/ TOMO W/ CAD
8 of 14 series · 8 of 30 positions shown · non-contrast
Comparison: Previous exam(s).

ACR Breast Density Category a: The breast tissue is almost entirely
fatty.

CLINICAL DATA: Screening.

EXAM:
2D DIGITAL SCREENING BILATERAL MAMMOGRAM WITH CAD AND ADJUNCT TOMO

[L MLO (1 of 2)]
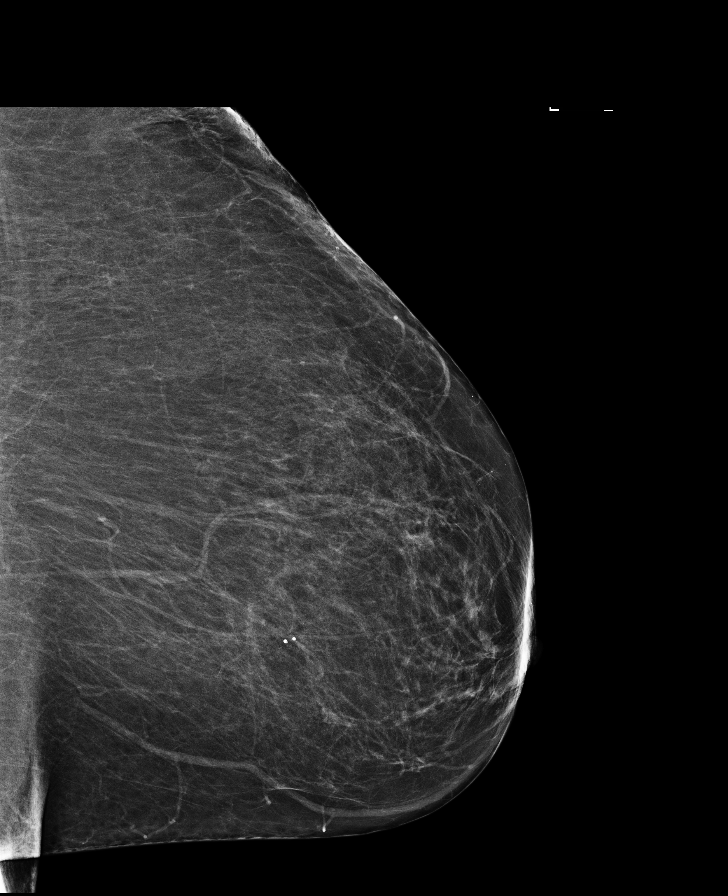

[R AT]
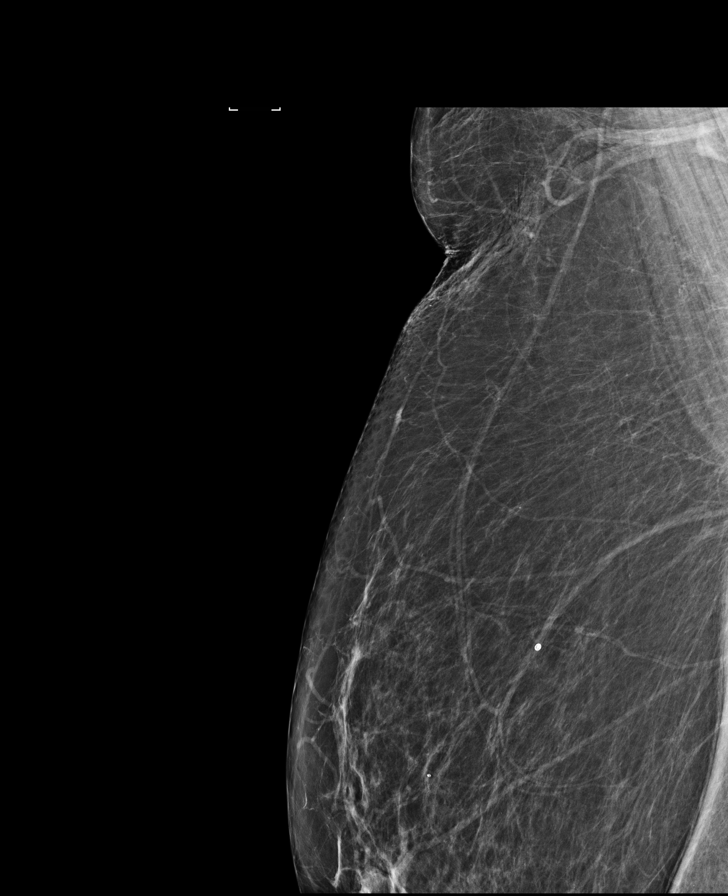

[R CC]
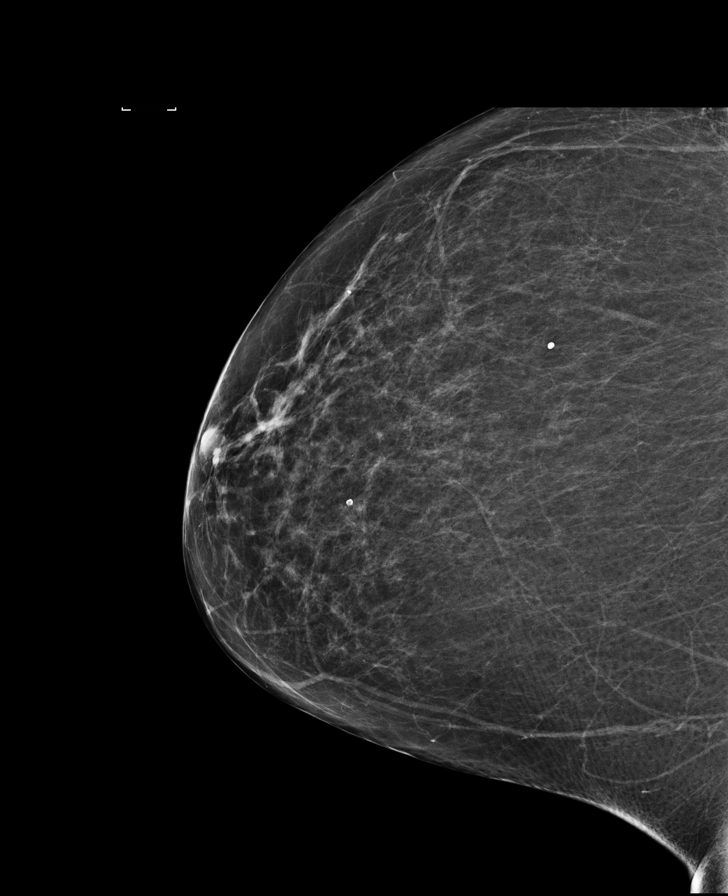

[R MLO synth-2D]
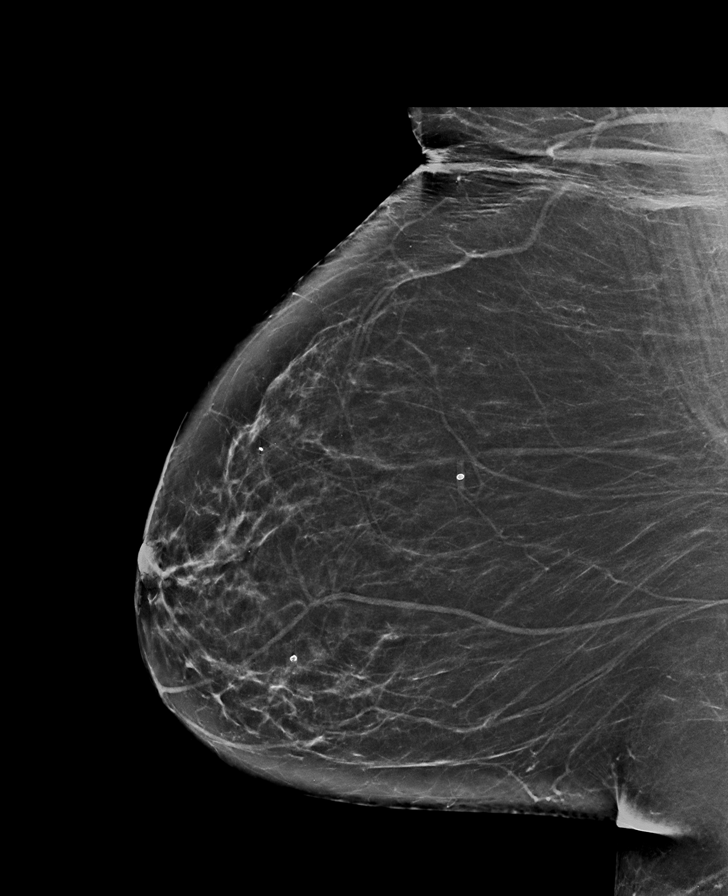

[R MLO]
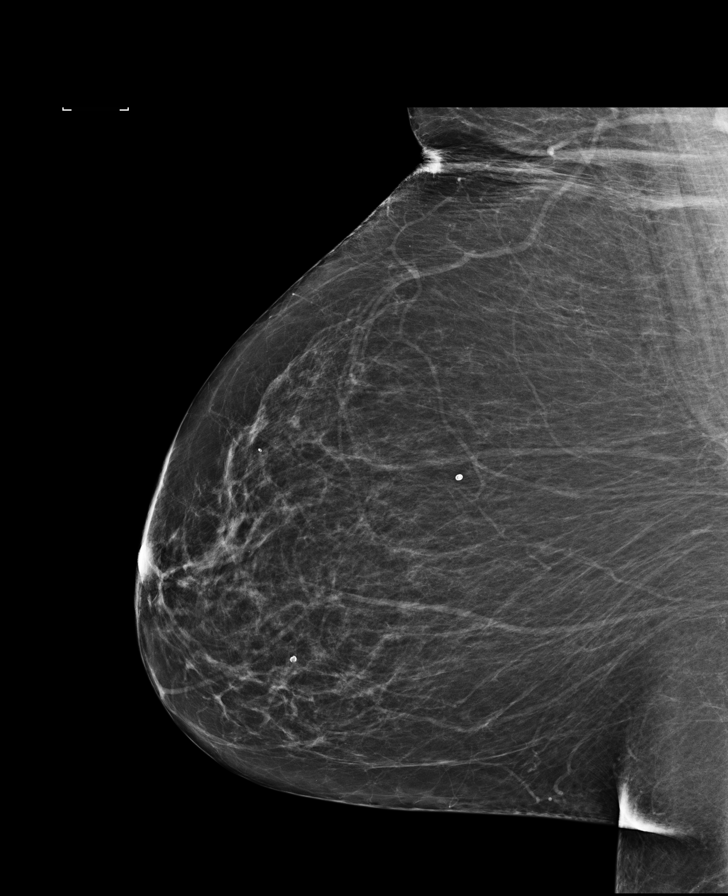

[L CC]
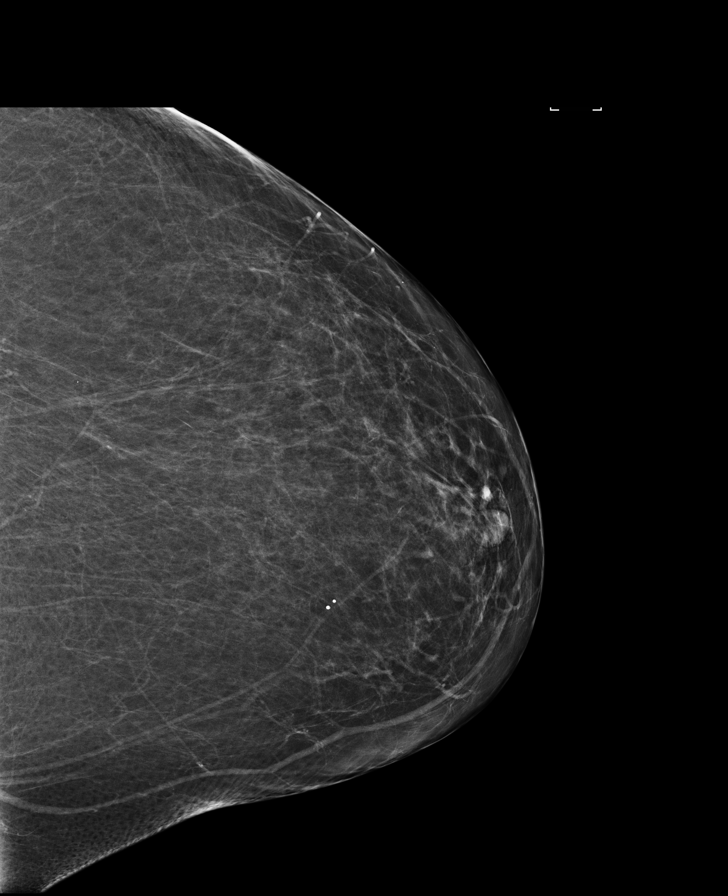

[L MLO (2 of 2)]
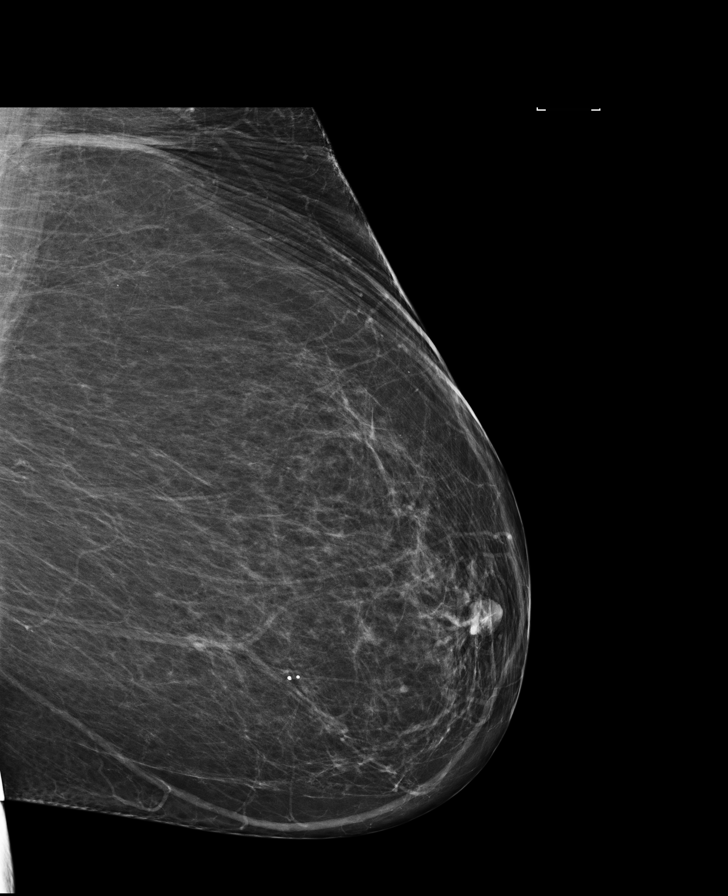

[L CC synth-2D]
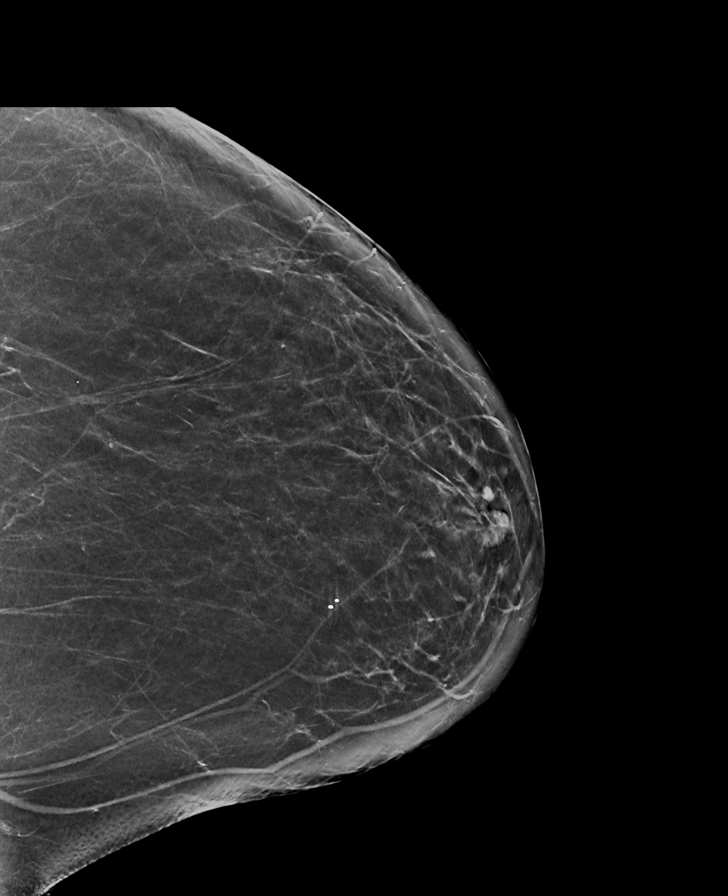

[8 of 30 positions shown; findings below may reference images not displayed]

FINDINGS: There are no findings suspicious for malignancy. Images were
processed with CAD.
IMPRESSION: No mammographic evidence of malignancy. A result letter of this
screening mammogram will be mailed directly to the patient.

RECOMMENDATION:
Screening mammogram in one year. (Code:[ZP])

BI-RADS CATEGORY  1: Negative.

## 2015-12-23 ENCOUNTER — Encounter: Payer: Self-pay | Admitting: Psychiatry

## 2015-12-23 ENCOUNTER — Ambulatory Visit: Payer: Medicare Other | Admitting: Psychiatry

## 2015-12-23 VITALS — BP 126/78 | HR 80 | Temp 97.4°F | Wt 285.8 lb

## 2015-12-23 DIAGNOSIS — F3340 Major depressive disorder, recurrent, in remission, unspecified: Secondary | ICD-10-CM

## 2015-12-23 MED ORDER — DULOXETINE HCL 30 MG PO CPEP: ORAL_CAPSULE | ORAL | 2 refills | Status: DC

## 2015-12-23 MED ORDER — QUETIAPINE FUMARATE 200 MG PO TABS: 200.0000 mg | ORAL_TABLET | Freq: Every day | ORAL | 0 refills | Status: DC

## 2015-12-23 NOTE — Progress Notes (Signed)
Patient ID: Laura [name], female   DOB: 1945-06-17, 70 y.o.   MRN: [id number]  [facility] MD/PA/NP OP Progress Note  12/23/2015 1:32 PM Laura [name]  MRN:  [id number]  Subjective:  Patient is a 70 year old Caucasian female with a long history of depression and insomnia.  Reports she is doing well moodwise.  States she is sleeping well and eating well. She reports being started on Remicade for her arthritis and states this is helping her pain. Reports she is not using the hydrocodone on a daily basis. Sometimes she has to take it in the morning to get started. Denies any suicidal thoughts.   Visit Diagnosis:     ICD-9-CM ICD-10-CM   1. Recurrent major depressive disorder, in remission (HCC) 296.35 F33.40     Past Medical History:  Past Medical History:  Diagnosis Date  . Allergy   . Anxiety   . Arthritis, degenerative 10/08/2013   Overview:    a.  Lumbar spine/spinal stenosis/foot drop.   b.  Hands.   . Chronic kidney disease   . Depression   . 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 Major depression, single episode, in complete remission (HCC) 06/25/2015  . Memory loss, short term 03/19/2014  . Seizure (HCC) 10/07/2014  . Sleep apnea   . Thyroid disease     Past Surgical History:  Procedure Laterality Date  . ABDOMINAL HYSTERECTOMY    . CESAREAN SECTION    . HIP SURGERY Right   . REPLACEMENT TOTAL KNEE Left    Family History:  Family History  Problem Relation Age of Onset  . Hypertension Mother   . Stroke Mother   . Heart attack Father   . Alcohol abuse Father   . Depression Father   . Post-traumatic stress disorder Father   . Rheum arthritis Sister   . Hypertension Sister   . Depression Sister    Social History:  Social History   Social History  . Marital status: Married    Spouse name: N/A  . Number of children: N/A  . Years of education: N/A   Social History Main Topics  . Smoking status: Never Smoker  . Smokeless tobacco: Never  Used  . Alcohol use No  . Drug use: No  . Sexual activity: Not Currently   Other Topics Concern  . None   Social History Narrative  . None   Additional History:   Assessment:   Musculoskeletal: Strength & Muscle Tone: within normal limits Gait & Station: normal Patient leans: N/A  Psychiatric Specialty Exam: HPI  Review of Systems  Psychiatric/Behavioral: Negative for depression, hallucinations, memory loss, substance abuse and suicidal ideas. The patient is not nervous/anxious and does not have insomnia (Avis complaints have improved and she attributes it to  Seroquel).   All other systems reviewed and are negative.    Blood pressure 126/78, pulse 80, temperature 97.4 F (36.3 C), temperature source Oral, weight 285 lb 12.5 oz (129.6 kg).Body mass index is 47.56 kg/m.  General Appearance: Well Groomed  Eye Contact:  Good  Speech:  Normal Rate  Volume:  Normal  Mood:  Good  Affect:  Congruent  Thought Process:  Linear and Logical  Orientation:  Full (Time, Place, and Person)  Thought Content:  Negative  Suicidal Thoughts:  No  Homicidal Thoughts:  No  Memory:  Immediate;   Good Recent;   Good Remote;   Good  Judgement:  Good  Insight:  Good  Psychomotor Activity:  Negative  Concentration:  Good  Recall:  Good  Fund of Knowledge: Good  Language: Good  Akathisia:  Negative  Handed:    AIMS (if indicated):  N/A  Assets:  Communication Skills Desire for Improvement Social Support  ADL's:  Intact  Cognition: WNL  Sleep:  fair   Is the patient at risk to self?  No. Has the patient been a risk to self in the past 6 months?  No. Has the patient been a risk to self within the distant past?  No. Is the patient a risk to others?  No. Has the patient been a risk to others in the past 6 months?  No. Has the patient been a risk to others within the distant past?  No.  Current Medications: Current Outpatient Prescriptions  Medication Sig Dispense Refill  .  amLODipine (NORVASC) 10 MG tablet Take 5 mg by mouth 2 (two) times daily.   11  . aspirin EC 81 MG tablet Take by mouth daily.     . benzonatate (TESSALON) 200 MG capsule Take 200 mg by mouth 3 (three) times daily as needed for cough.    . Cholecalciferol (VITAMIN D) 2000 UNITS tablet Take by mouth daily.     Marland Kitchen docusate sodium (COLACE) 100 MG capsule Take 100 mg by mouth 2 (two) times daily.    . DULoxetine (CYMBALTA) 30 MG capsule Two tablets in the morning, and one in the evening. 90 capsule 2  . esomeprazole (NEXIUM) 20 MG capsule Take by mouth daily.     . folic acid (FOLVITE) 1 MG tablet Take 1 mg by mouth daily.    . furosemide (LASIX) 40 MG tablet TAKE 1 TABLET ONCE DAILY    . hydroxychloroquine (PLAQUENIL) 200 MG tablet Reported on 07/15/2015    . hydroxychloroquine (PLAQUENIL) 200 MG tablet Take 400 mg by mouth daily.    . InFLIXimab-dyyb (INFLECTRA) 100 MG SOLR Inject into the vein.    Marland Kitchen levothyroxine (SYNTHROID, LEVOTHROID) 200 MCG tablet TAKE 1 TABLET ONCE DAILY- ON AN EMPTY STOMACH WITH A GLASS OF WATER 30-60 MINUTES BEFORE BREAKFAST  5  . levothyroxine (SYNTHROID, LEVOTHROID) 25 MCG tablet TAKE 1 TABLET BY MOUTH EVERY DAY (TAKE WITH 20O MCG TABLET TO EQUAL A 225 MCG DOSE)  5  . loratadine (CLARITIN) 10 MG tablet Take by mouth daily as needed.     . methotrexate (RHEUMATREX) 10 MG tablet Take 10 mg by mouth once a week. Caution: Chemotherapy. Protect from light.    . methylPREDNISolone (MEDROL) 4 MG TBPK tablet Follow package instructions. 21 tablet 0  . metoprolol succinate (TOPROL-XL) 50 MG 24 hr tablet TAKE 1 TABLET (50 MG TOTAL) BY MOUTH ONCE DAILY.  1  . oxyCODONE (OXY IR/ROXICODONE) 5 MG immediate release tablet Take 1 tablet (5 mg total) by mouth every 6 (six) hours as needed for severe pain. 120 tablet 0  . [START ON 12/27/2015] oxyCODONE (OXY IR/ROXICODONE) 5 MG immediate release tablet Take 1 tablet (5 mg total) by mouth every 6 (six) hours as needed for severe pain. 120  tablet 0  . QUEtiapine (SEROQUEL) 200 MG tablet Take 1 tablet (200 mg total) by mouth at bedtime. 90 tablet 0  . QUEtiapine (SEROQUEL) 50 MG tablet Take 1 tablet (50 mg total) by mouth 2 (two) times daily. 180 tablet 1  . spironolactone (ALDACTONE) 25 MG tablet TAKE 1/2 (ONE-HALF) TABLET BY MOUTH DAILY  11  . traMADol (ULTRAM) 50 MG tablet Reported on 07/15/2015  0  . oxyCODONE (OXY IR/ROXICODONE)  5 MG immediate release tablet Take 1 tablet (5 mg total) by mouth every 6 (six) hours as needed for severe pain. 120 tablet 0   No current facility-administered medications for this visit.     Medical Decision Making:  Established Problem, Stable/Improving (1), Review of Medication Regimen & Side Effects (2) and Review of New Medication or Change in Dosage (2)  Treatment Plan Summary:Medication management and Plan    Major depressive disorder, recurrent, moderate-patient will continue her Cymbalta 60 mg in the morning and 30 mg in the evening.   Insomnia- Continue Seroquel at 200 mg at bedtime.   Anxiety- take Seroquel at 50 mg twice daily. If she gets too sedated in the morning to shift change that to bedtime.  RTC to clinic in 3 months time or call before if necessary.  Ramyah Pankowski 12/23/2015, 1:32 PM

## 2015-12-29 ENCOUNTER — Telehealth: Payer: Self-pay

## 2015-12-29 NOTE — Telephone Encounter (Signed)
received a fax requesting a 90 day supply for duloxetine per pharmacy insurance will not pay but for a 90 day supply

## 2015-12-29 NOTE — Telephone Encounter (Signed)
left message on pharmacy line that it was ok for 90 day supply with left message on pharmacy line that it was ok for 90 day supply with no additional refills no additional refills

## 2016-01-14 ENCOUNTER — Ambulatory Visit: Payer: PRIVATE HEALTH INSURANCE | Admitting: Pain Medicine

## 2016-01-14 NOTE — Progress Notes (Deleted)
[name]  MRN: [id number]  Referring Provider: Glendon Axe, MD  DOB: 1945/09/11  PCP: Glendon Axe, MD  DOS: 01/14/2016  Note by: Kathlen Brunswick. Dossie Arbour, MD  Service setting: Ambulatory outpatient  Specialty: Interventional Pain Management  Location: [facility] (AMB) Pain Management Facility    Patient type: Established   Primary Reason(s) for Visit: Encounter for prescription drug management & post-procedure evaluation of chronic illness with mild to moderate exacerbation(Level of risk: moderate) CC: No chief complaint on file.  HPI  Laura [name] is a 71 y.o. year old, female patient, who comes today for a post-procedure evaluation and medication management. She has Chronic kidney disease (CKD), stage III (moderate); Depression; Essential (primary) hypertension; GERD (gastroesophageal reflux disease); Adult hypothyroidism; HLD (hyperlipidemia); Extreme obesity ([place]); Insomnia secondary to chronic pain; Congestive heart failure with left ventricular systolic dysfunction ([place]); Type 2 diabetes mellitus ([place]); Morbid obesity ([place]); Seronegative rheumatoid arthritis ([place]); Chronic knee pain (Right); Opiate use (30 MME/Day); Long term current use of opiate analgesic; Long term prescription opiate use; Encounter for therapeutic drug level monitoring; Opioid dependence ([place]); Lumbar spinal stenosis (5 mm Severe L3-4; 8 mm L4-5); Lumbar spondylosis; Lumbar facet syndrome (Location of Primary Source of Pain) (Bilateral) (L>R); Chronic pain syndrome; Chronic low back pain (Location of Primary Source of Pain) (Bilateral) (L>R); History of TKR (total knee replacement) (Left); History of femur fracture (Right); 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 Her primarily concern today is the No chief complaint on file.  Pain Assessment: Self-Reported Pain Score:  /10             Reported level is compatible with observation.          Laura [name] was last seen on 11/11/2015 for a procedure. During today's appointment we reviewed Laura [name]'s post-procedure results, as well as her outpatient medication regimen.  Further details on both, my assessment(s), as well as the proposed treatment plan, please see below.  Controlled Substance Pharmacotherapy Assessment REMS (Risk Evaluation and Mitigation Strategy)  Analgesic: Oxycodone IR 5 mg every 6 hours (20 mg/day) MME/day: 30 mg/day No notes on file Pharmacokinetics: Liberation and absorption (onset of action): WNL Distribution (time to peak effect): WNL Metabolism and excretion (duration of action): WNL         Pharmacodynamics: Desired effects: Analgesia: Laura [name] reports >50% benefit. Functional ability: Patient reports that medication allows her to accomplish basic ADLs Clinically meaningful improvement in function (CMIF): Sustained CMIF goals met Perceived effectiveness: Described as relatively effective, allowing for increase in activities of daily living (ADL) Undesirable effects: Side-effects or Adverse reactions: None reported Monitoring: [place] PMP: Online review of the past 7-monthperiod conducted. Compliant with practice rules and regulations List of all UDS test(s) done:  Lab Results  Component Value Date   TOXASSSELUR FINAL 07/15/2015   TOXASSSELUR FINAL 05/01/2015   [place] 11/05/2014   Last UDS on record: Tox[employer] Select 13  Date Value Ref Range Status  07/15/2015 FINAL  Final    Comment:    ==================================================================== TOXASSURE SELECT 13 (MW) ==================================================================== Test                              Result  Flag       Units Drug Present and Declared for Prescription Verification   Oxycodone                      806          EXPECTED   ng/mg creat   Oxymorphone                    220          EXPECTED   ng/mg creat   Noroxycodone                   4112         EXPECTED   ng/mg creat   Noroxymorphone                 170          EXPECTED   ng/mg creat    Sources of oxycodone are scheduled prescription medications.    Oxymorphone, noroxycodone, and noroxymorphone are expected    metabolites of oxycodone. Oxymorphone is also available as a    scheduled prescription medication.   Tramadol                       PRESENT      EXPECTED   O-Desmethyltramadol            PRESENT      EXPECTED   N-Desmethyltramadol            PRESENT      EXPECTED    Source of tramadol is a prescription medication.    O-desmethyltramadol and N-desmethyltramadol are expected    metabolites of tramadol. ==================================================================== Test                      Result    Flag   Units      Ref Range   Creatinine              102              mg/dL      >=20 ==================================================================== Declared Medications:  The flagging and interpretation on this report are based on the  following declared medications.  Unexpected results may arise from  inaccuracies in the declared medications.  **Note: The testing scope of this panel includes these medications:  Oxycodone (Roxicodone)  Tramadol (Ultram)  **Note: The testing scope of this panel does not include following  reported medications:  Amlodipine (Norvasc)  Aspirin (Aspirin 81)  Cholecalciferol  Duloxetine (Cymbalta)  Furosemide (Lasix)  Hydroxychloroquine (Plaquenil)  Levothyroxine  Loratadine  Metoprolol  Omeprazole (Nexium)  Quetiapine (Seroquel)  Spironolactone (Aldactone) ==================================================================== For clinical consultation,  please call [phone]. ====================================================================    UDS interpretation: Compliant          Medication Assessment Form: Reviewed. Patient indicates being compliant with therapy Treatment compliance: Compliant Risk Assessment Profile: Aberrant behavior: See prior evaluations. None observed or detected today Comorbid factors increasing risk of overdose: See prior notes. No additional risks detected today Risk of substance use disorder (SUD): Low Opioid Risk Tool (ORT) Total Score:    Interpretation Table:  Score <3 = Low Risk for SUD  Score between 4-7 = Moderate Risk for SUD  Score >8 = High Risk for Opioid Abuse   Risk Mitigation Strategies:  Patient Counseling: Covered Patient-Prescriber Agreement (PPA): Present and active  Notification to other healthcare providers: Done  Pharmacologic Plan:  No change in therapy, at this time  Post-Procedure Assessment  11/11/2015 Procedure: Diagnostic right intra-articular steroid knee injection Post-procedure pain score: 0/10 (100% relief) Influential Factors: BMI:   Intra-procedural challenges: None observed Assessment challenges: None detected         Post-procedural side-effects, adverse reactions, or complications: None reported Reported issues: None  Sedation: No sedation used. When no sedatives are used, the analgesic levels obtained are directly associated to the effectiveness of the local anesthetics. However, when sedation is provided, the level of analgesia obtained during the initial 1 hour following the intervention, is believed to be the result of a combination of factors. These factors may include, but are not limited to: 1. The effectiveness of the local anesthetics used. 2. The effects of the analgesic(s) and/or anxiolytic(s) used. 3. The degree of discomfort experienced by the patient at the time of the procedure. 4. The patients ability and reliability in recalling and  recording the events. 5. The presence and influence of possible secondary gains and/or psychosocial factors. Reported result: Relief experienced during the 1st hour after the procedure:   (Ultra-Short Term Relief) Interpretative annotation: Analgesia during this period is likely to be Local Anesthetic and/or IV Sedative (Analgesic/Anxiolitic) related.          Effects of local anesthetic: The analgesic effects attained during this period are directly associated to the localized infiltration of local anesthetics and therefore cary significant diagnostic value as to the etiological location, or anatomical origin, of the pain. Expected duration of relief is directly dependent on the pharmacodynamics of the local anesthetic used. Long-acting (4-6 hours) anesthetics used.  Reported result: Relief during the next 4 to 6 hour after the procedure:   (Short-Term Relief) Interpretative annotation: Complete relief would suggest area to be the source of the pain.          Long-term benefit: Defined as the period of time past the expected duration of local anesthetics. With the possible exception of prolonged sympathetic blockade from the local anesthetics, benefits during this period are typically attributed to, or associated with, other factors such as analgesic sensory neuropraxia, antiinflammatory effects, or beneficial biochemical changes provided by agents other than the local anesthetics Reported result: Extended relief following procedure:   (Long-Term Relief) Interpretative annotation: Good relief. This could suggest inflammation to be a significant component in the etiology to the pain.          Current benefits: Defined as persistent relief that continues at this point in time.   Reported results: Treated area: *** %       Interpretative annotation: Ongoing benefits would suggest effective therapeutic approach  Interpretation: Results would suggest a successful diagnostic intervention.           Laboratory Chemistry  Inflammation Markers Lab Results  Component Value Date   ESRSEDRATE 32 (H) 07/15/2015   CRP 1.9 (H) 07/15/2015   Renal Function Lab Results  Component Value Date   BUN 21 (H) 07/15/2015   CREATININE 1.22 (H) 07/15/2015   GFRAA 51 (L) 07/15/2015   GFRNONAA 44 (L) 07/15/2015   Hepatic Function Lab Results  Component Value Date   AST 19 07/15/2015   ALT 15 07/15/2015   ALBUMIN 4.0 07/15/2015   Electrolytes Lab Results  Component Value Date   NA 140 07/15/2015   K 3.7 07/15/2015   CL 101 07/15/2015   CALCIUM 9.1 07/15/2015   MG 2.1 07/15/2015   Pain Modulating Vitamins Lab Results  Component Value Date   25OHVITD1 72  07/15/2015   25OHVITD2 3.6 07/15/2015   25OHVITD3 32 07/15/2015   VITAMINB12 610 07/15/2015   Coagulation Parameters Lab Results  Component Value Date   PLT 251 06/12/2013   Cardiovascular Lab Results  Component Value Date   HGB 13.6 06/12/2013   HCT 41.8 06/12/2013   Note: Lab results reviewed.  Recent Diagnostic Imaging Review  Mm Screening Breast Tomo Bilateral  Result Date: 11/26/2015 CLINICAL DATA:  Screening. EXAM: 2D DIGITAL SCREENING BILATERAL MAMMOGRAM WITH CAD AND ADJUNCT TOMO COMPARISON:  Previous exam(s). ACR Breast Density Category a: The breast tissue is almost entirely fatty. FINDINGS: There are no findings suspicious for malignancy. Images were processed with CAD. IMPRESSION: No mammographic evidence of malignancy. A result letter of this screening mammogram will be mailed directly to the patient. RECOMMENDATION: Screening mammogram in one year. (Code:SM-B-01Y) BI-RADS CATEGORY  1: Negative. Electronically Signed   By: Curlene Dolphin M.D.   On: 11/26/2015 15:50   Note: Imaging results reviewed.          Meds  The patient has a current medication list which includes the following prescription(s): amlodipine, aspirin ec, benzonatate, vitamin d, docusate sodium, duloxetine, esomeprazole, folic acid,  furosemide, hydroxychloroquine, hydroxychloroquine, infliximab-dyyb, levothyroxine, levothyroxine, loratadine, methotrexate, methylprednisolone, metoprolol succinate, oxycodone, oxycodone, oxycodone, quetiapine, quetiapine, spironolactone, and tramadol.  Current Outpatient Prescriptions on File Prior to Visit  Medication Sig  . amLODipine (NORVASC) 10 MG tablet Take 5 mg by mouth 2 (two) times daily.   Marland Kitchen aspirin EC 81 MG tablet Take by mouth daily.   . benzonatate (TESSALON) 200 MG capsule Take 200 mg by mouth 3 (three) times daily as needed for cough.  . Cholecalciferol (VITAMIN D) 2000 UNITS tablet Take by mouth daily.   Marland Kitchen docusate sodium (COLACE) 100 MG capsule Take 100 mg by mouth 2 (two) times daily.  . DULoxetine (CYMBALTA) 30 MG capsule Two tablets in the morning, and one in the evening.  Marland Kitchen esomeprazole (NEXIUM) 20 MG capsule Take by mouth daily.   . folic acid (FOLVITE) 1 MG tablet Take 1 mg by mouth daily.  . furosemide (LASIX) 40 MG tablet TAKE 1 TABLET ONCE DAILY  . hydroxychloroquine (PLAQUENIL) 200 MG tablet Reported on 07/15/2015  . hydroxychloroquine (PLAQUENIL) 200 MG tablet Take 400 mg by mouth daily.  . InFLIXimab-dyyb (INFLECTRA) 100 MG SOLR Inject into the vein.  Marland Kitchen levothyroxine (SYNTHROID, LEVOTHROID) 200 MCG tablet TAKE 1 TABLET ONCE DAILY- ON AN EMPTY STOMACH WITH A GLASS OF WATER 30-60 MINUTES BEFORE BREAKFAST  . levothyroxine (SYNTHROID, LEVOTHROID) 25 MCG tablet TAKE 1 TABLET BY MOUTH EVERY DAY (TAKE WITH 20O MCG TABLET TO EQUAL A 225 MCG DOSE)  . loratadine (CLARITIN) 10 MG tablet Take by mouth daily as needed.   . methotrexate (RHEUMATREX) 10 MG tablet Take 10 mg by mouth once a week. Caution: Chemotherapy. Protect from light.  . methylPREDNISolone (MEDROL) 4 MG TBPK tablet Follow package instructions.  . metoprolol succinate (TOPROL-XL) 50 MG 24 hr tablet TAKE 1 TABLET (50 MG TOTAL) BY MOUTH ONCE DAILY.  Marland Kitchen oxyCODONE (OXY IR/ROXICODONE) 5 MG immediate release tablet  Take 1 tablet (5 mg total) by mouth every 6 (six) hours as needed for severe pain.  Marland Kitchen oxyCODONE (OXY IR/ROXICODONE) 5 MG immediate release tablet Take 1 tablet (5 mg total) by mouth every 6 (six) hours as needed for severe pain.  Marland Kitchen oxyCODONE (OXY IR/ROXICODONE) 5 MG immediate release tablet Take 1 tablet (5 mg total) by mouth every 6 (six) hours as needed for severe  pain.  Marland Kitchen QUEtiapine (SEROQUEL) 200 MG tablet Take 1 tablet (200 mg total) by mouth at bedtime.  Marland Kitchen QUEtiapine (SEROQUEL) 50 MG tablet Take 1 tablet (50 mg total) by mouth 2 (two) times daily.  Marland Kitchen spironolactone (ALDACTONE) 25 MG tablet TAKE 1/2 (ONE-HALF) TABLET BY MOUTH DAILY  . traMADol (ULTRAM) 50 MG tablet Reported on 07/15/2015   No current facility-administered medications on file prior to visit.    ROS  Constitutional: Denies any fever or chills Gastrointestinal: No reported hemesis, hematochezia, vomiting, or acute GI distress Musculoskeletal: Denies any acute onset joint swelling, redness, loss of ROM, or weakness Neurological: No reported episodes of acute onset apraxia, aphasia, dysarthria, agnosia, amnesia, paralysis, loss of coordination, or loss of consciousness  Allergies  Laura [name] is allergic to penicillins.  PFSH  Drug: Laura [name]  reports that she does not use drugs. Alcohol:  reports that she does not drink alcohol. Tobacco:  reports that she has never smoked. She has never used smokeless tobacco. Medical:  has a past medical history of Allergy; Anxiety; Arthritis, degenerative (10/08/2013); Chronic kidney disease; Depression; Lumbar spinal stenosis with neurogenic claudication (11/07/2014); Major depression, single episode, in complete remission ([place]) (06/25/2015); Memory loss, short term (03/19/2014); Seizure ([place]) (10/07/2014); Sleep apnea; and Thyroid disease. Family: family history includes Alcohol abuse in her father; Depression in her father and sister; Heart attack in her father; Hypertension in her mother  and sister; Post-traumatic stress disorder in her father; Rheum arthritis in her sister; Stroke in her mother.  Past Surgical History:  Procedure Laterality Date  . ABDOMINAL HYSTERECTOMY    . CESAREAN SECTION    . HIP SURGERY Right   . REPLACEMENT TOTAL KNEE Left    Constitutional Exam  General appearance: Well nourished, well developed, and well hydrated. In no apparent acute distress There were no vitals filed for this visit. BMI Assessment: Estimated body mass index is 47.56 kg/m as calculated from the following:   Height as of 10/15/15: _0  (1.651 m).   Weight as of 12/23/15: 285 lb 12.5 oz (129.6 kg).  BMI interpretation table: BMI level Category Range association with higher incidence of chronic pain  <18 kg/m2 Underweight   18.5-24.9 kg/m2 Ideal body weight   25-29.9 kg/m2 Overweight Increased incidence by 20%  30-34.9 kg/m2 Obese (Class I) Increased incidence by 68%  35-39.9 kg/m2 Severe obesity (Class II) Increased incidence by 136%  >40 kg/m2 Extreme obesity (Class III) Increased incidence by 254%   BMI Readings from Last 4 Encounters:  10/15/15 45.26 kg/m  07/15/15 45.26 kg/m  05/01/15 47.20 kg/m  04/06/15 44.93 kg/m   Wt Readings from Last 4 Encounters:  10/15/15 272 lb (123.4 kg)  07/15/15 272 lb (123.4 kg)  05/01/15 275 lb (124.7 kg)  04/06/15 270 lb (122.5 kg)  Psych/Mental status: Alert, oriented x 3 (person, place, & time) Eyes: PERLA Respiratory: No evidence of acute respiratory distress  Cervical Spine Exam  Inspection: No masses, redness, or swelling Alignment: Symmetrical Functional ROM: Unrestricted ROM Stability: No instability detected Muscle strength & Tone: Functionally intact Sensory: Unimpaired Palpation: Non-contributory  Upper Extremity (UE) Exam    Side: Right upper extremity  Side: Left upper extremity  Inspection: No masses, redness, swelling, or asymmetry  Inspection: No masses, redness, swelling, or asymmetry  Functional  ROM: Unrestricted ROM          Functional ROM: Unrestricted ROM          Muscle strength & Tone: Functionally intact  Muscle strength &  Tone: Functionally intact  Sensory: Unimpaired  Sensory: Unimpaired  Palpation: Non-contributory  Palpation: Non-contributory   Thoracic Spine Exam  Inspection: No masses, redness, or swelling Alignment: Symmetrical Functional ROM: Unrestricted ROM Stability: No instability detected Sensory: Unimpaired Muscle strength & Tone: Functionally intact Palpation: Non-contributory  Lumbar Spine Exam  Inspection: No masses, redness, or swelling Alignment: Symmetrical Functional ROM: Unrestricted ROM Stability: No instability detected Muscle strength & Tone: Functionally intact Sensory: Unimpaired Palpation: Non-contributory Provocative Tests: Lumbar Hyperextension and rotation test: evaluation deferred today       Patrick's Maneuver: evaluation deferred today              Gait & Posture Assessment  Ambulation: Unassisted Gait: Relatively normal for age and body habitus Posture: WNL   Lower Extremity Exam    Side: Right lower extremity  Side: Left lower extremity  Inspection: No masses, redness, swelling, or asymmetry  Inspection: No masses, redness, swelling, or asymmetry  Functional ROM: Unrestricted ROM          Functional ROM: Unrestricted ROM          Muscle strength & Tone: Functionally intact  Muscle strength & Tone: Functionally intact  Sensory: Unimpaired  Sensory: Unimpaired  Palpation: Non-contributory  Palpation: Non-contributory   Assessment  Primary Diagnosis & Pertinent Problem List: The primary encounter diagnosis was Chronic pain syndrome. Diagnoses of Lumbar facet syndrome (Location of Primary Source of Pain) (Bilateral) (L>R), Chronic low back pain (Location of Primary Source of Pain) (Bilateral) (L>R), Spinal stenosis of lumbar region without neurogenic claudication, Long term current use of opiate analgesic, and Opiate use (30  MME/Day) were also pertinent to this visit.  Status Diagnosis   Stable  Stable  Stable 1. Chronic pain syndrome   2. Lumbar facet syndrome (Location of Primary Source of Pain) (Bilateral) (L>R)   3. Chronic low back pain (Location of Primary Source of Pain) (Bilateral) (L>R)   4. Spinal stenosis of lumbar region without neurogenic claudication   5. Long term current use of opiate analgesic   6. Opiate use (30 MME/Day)      Plan of Care  Pharmacotherapy (Medications Ordered): No orders of the defined types were placed in this encounter.  New Prescriptions   No medications on file   Medications administered today: Laura [name] had no medications administered during this visit. Lab-work, procedure(s), and/or referral(s): No orders of the defined types were placed in this encounter.  Imaging and/or referral(s): None  Interventional therapies: Planned, scheduled, and/or pending:   ***   Considering:  Diagnostic intra-articular left hip joint injection under fluoroscopic guidance, with or without sedation.  Possible left hip radiofrequency ablation under fluoroscopic guidance and IV sedation.  Diagnostic right intra-articular knee joint injection with local anesthetic and steroid under fluoroscopic guidance, no sedation.  Possible series of 5 Hyalgan right knee injections under fluoroscopic guidance, no sedation.  Diagnostic bilateral genicular nerve block under fluoroscopic guidance, with or without sedation.  Possible bilateral genicular nerve radiofrequency ablation under fluoroscopic guidance and IV sedation.  Diagnostic bilateral lumbar facet block under fluoroscopic guidance and IV sedation.  Possible bilateral lumbar facet radiofrequency ablation under fluoroscopic guidance and IV sedation.  Diagnostic bilateral L3-4 and/or L5-S1 transforaminal epidural steroid injections under fluoroscopic guidance, with or without IV sedation.  Diagnostic L3-4 versus L4-5 lumbar epidural  steroid injection under fluoroscopic guidance, with or without IV sedation.    Palliative PRN treatment(s):  Diagnostic intra-articular left hip joint injection under fluoroscopic guidance, with or without sedation.  Diagnostic  right intra-articular knee joint injection with local anesthetic and steroid under fluoroscopic guidance, no sedation.  Possible series of 5 Hyalgan right knee injections under fluoroscopic guidance, no sedation.  Diagnostic bilateral genicular nerve block under fluoroscopic guidance, with or without sedation.  Diagnostic bilateral lumbar facet block under fluoroscopic guidance and IV sedation.  Diagnostic bilateral L3-4 and/or L5-S1 transforaminal epidural steroid injections under fluoroscopic guidance, with or without IV sedation.  Diagnostic L3-4 versus L4-5 lumbar epidural steroid injection under fluoroscopic guidance, with or without IV sedation.    Provider-requested follow-up: No Follow-up on file.  Future Appointments Date Time Provider [place]  01/14/2016 8:30 AM Milinda Pointer, MD [facility] None  03/21/2016 2:00 PM Elvin So, MD [facility] None   Primary Care Physician: Glendon Axe, MD Location: [facility] Facility Note by: Kathlen Brunswick. Dossie Arbour, M.D, DABA, DABAPM, DABPM, DABIPP, FIPP Date: 01/14/16; Time: 8:19 AM  Pain Score Disclaimer: We use the NRS-11 scale. This is a self-reported, subjective measurement of pain severity with only modest accuracy. It is used primarily to identify changes within a particular patient. It must be understood that outpatient pain scales are significantly less accurate that those used for research, where they can be applied under ideal controlled circumstances with minimal exposure to variables. In reality, the score is likely to be a combination of pain intensity and pain affect, where pain affect describes the degree of emotional arousal or changes in action readiness caused by the sensory  experience of pain. Factors such as social and work situation, setting, emotional state, anxiety levels, expectation, and prior pain experience may influence pain perception and show large inter-individual differences that may also be affected by time variables.  Patient instructions provided during this appointment: There are no Patient Instructions on file for this visit.

## 2016-01-25 ENCOUNTER — Ambulatory Visit: Payer: Medicare Other | Attending: Pain Medicine | Admitting: Pain Medicine

## 2016-01-25 ENCOUNTER — Encounter: Payer: Self-pay | Admitting: Pain Medicine

## 2016-01-25 VITALS — BP 140/91 | HR 83 | Temp 97.8°F | Resp 16 | Ht 65.0 in | Wt 272.0 lb

## 2016-01-25 DIAGNOSIS — Z7982 Long term (current) use of aspirin: Secondary | ICD-10-CM | POA: Insufficient documentation

## 2016-01-25 DIAGNOSIS — Z6841 Body Mass Index (BMI) 40.0 and over, adult: Secondary | ICD-10-CM | POA: Insufficient documentation

## 2016-01-25 DIAGNOSIS — Z818 Family history of other mental and behavioral disorders: Secondary | ICD-10-CM | POA: Diagnosis not present

## 2016-01-25 DIAGNOSIS — Z880 Allergy status to penicillin: Secondary | ICD-10-CM | POA: Diagnosis not present

## 2016-01-25 DIAGNOSIS — Z8261 Family history of arthritis: Secondary | ICD-10-CM | POA: Insufficient documentation

## 2016-01-25 DIAGNOSIS — G8929 Other chronic pain: Secondary | ICD-10-CM

## 2016-01-25 DIAGNOSIS — M25561 Pain in right knee: Secondary | ICD-10-CM | POA: Diagnosis not present

## 2016-01-25 DIAGNOSIS — E039 Hypothyroidism, unspecified: Secondary | ICD-10-CM | POA: Diagnosis not present

## 2016-01-25 DIAGNOSIS — Z96652 Presence of left artificial knee joint: Secondary | ICD-10-CM | POA: Diagnosis not present

## 2016-01-25 DIAGNOSIS — Z79899 Other long term (current) drug therapy: Secondary | ICD-10-CM | POA: Diagnosis not present

## 2016-01-25 DIAGNOSIS — M48062 Spinal stenosis, lumbar region with neurogenic claudication: Secondary | ICD-10-CM | POA: Insufficient documentation

## 2016-01-25 DIAGNOSIS — Z823 Family history of stroke: Secondary | ICD-10-CM | POA: Diagnosis not present

## 2016-01-25 DIAGNOSIS — I129 Hypertensive chronic kidney disease with stage 1 through stage 4 chronic kidney disease, or unspecified chronic kidney disease: Secondary | ICD-10-CM | POA: Diagnosis not present

## 2016-01-25 DIAGNOSIS — F411 Generalized anxiety disorder: Secondary | ICD-10-CM | POA: Insufficient documentation

## 2016-01-25 DIAGNOSIS — Z811 Family history of alcohol abuse and dependence: Secondary | ICD-10-CM | POA: Insufficient documentation

## 2016-01-25 DIAGNOSIS — N183 Chronic kidney disease, stage 3 (moderate): Secondary | ICD-10-CM | POA: Diagnosis not present

## 2016-01-25 DIAGNOSIS — M170 Bilateral primary osteoarthritis of knee: Secondary | ICD-10-CM

## 2016-01-25 DIAGNOSIS — M160 Bilateral primary osteoarthritis of hip: Secondary | ICD-10-CM | POA: Diagnosis not present

## 2016-01-25 DIAGNOSIS — G894 Chronic pain syndrome: Secondary | ICD-10-CM

## 2016-01-25 DIAGNOSIS — Z8249 Family history of ischemic heart disease and other diseases of the circulatory system: Secondary | ICD-10-CM | POA: Diagnosis not present

## 2016-01-25 DIAGNOSIS — Z79891 Long term (current) use of opiate analgesic: Secondary | ICD-10-CM | POA: Diagnosis not present

## 2016-01-25 DIAGNOSIS — F329 Major depressive disorder, single episode, unspecified: Secondary | ICD-10-CM | POA: Diagnosis not present

## 2016-01-25 DIAGNOSIS — E785 Hyperlipidemia, unspecified: Secondary | ICD-10-CM | POA: Insufficient documentation

## 2016-01-25 DIAGNOSIS — M1711 Unilateral primary osteoarthritis, right knee: Secondary | ICD-10-CM

## 2016-01-25 DIAGNOSIS — K219 Gastro-esophageal reflux disease without esophagitis: Secondary | ICD-10-CM | POA: Insufficient documentation

## 2016-01-25 MED ORDER — OXYCODONE HCL 5 MG PO TABS: 5.0000 mg | ORAL_TABLET | Freq: Four times a day (QID) | ORAL | 0 refills | Status: DC | PRN

## 2016-01-25 NOTE — Patient Instructions (Signed)

## 2016-01-25 NOTE — Progress Notes (Signed)
Nursing Pain Medication Assessment:  Safety precautions to be maintained throughout the outpatient stay will include: orient to surroundings, keep bed in low position, maintain call bell within reach at all times, provide assistance with transfer out of bed and ambulation.  Medication Inspection Compliance: Pill count conducted under aseptic conditions, in front of the patient. Neither the pills nor the bottle was removed from the patient's sight at any time. Once count was completed pills were immediately returned to the patient in their original bottle. Pill Count: 0 of 120 pills remain Bottle Appearance: Standard pharmacy container. Clearly labeled. Medication: See above Filled Date: 12 / 18 / 2017  Last taken : 01/24/16 am

## 2016-01-25 NOTE — Progress Notes (Signed)
[name]  MRN: [id number]  Referring Provider: Glendon Axe, MD  DOB: 1945-06-02  PCP: Glendon Axe, MD  DOS: 01/25/2016  Note by: Kathlen Brunswick. Dossie Arbour, MD  Service setting: Ambulatory outpatient  Specialty: Interventional Pain Management  Location: [facility] (AMB) Pain Management Facility    Patient type: Established   Primary Reason(s) for Visit: Encounter for prescription drug management & post-procedure evaluation of chronic illness with mild to moderate exacerbation(Level of risk: moderate) CC: Back Pain (lower) and Knee Pain (right)  HPI  Ms. Abt is a 71 y.o. year old, female patient, who comes today for a post-procedure evaluation and medication management. She has Chronic kidney disease (CKD), stage III (moderate); Depression; Essential (primary) hypertension; GERD (gastroesophageal reflux disease); Adult hypothyroidism; HLD (hyperlipidemia); Extreme obesity ([place]); Insomnia secondary to chronic pain; Congestive heart failure with left ventricular systolic dysfunction ([place]); Type 2 diabetes mellitus ([place]); Morbid obesity ([place]); Seronegative rheumatoid arthritis ([place]); Chronic knee pain (Right); Opiate use (30 MME/Day); Long term current use of opiate analgesic; Long term prescription opiate use; Encounter for therapeutic drug level monitoring; Opioid dependence ([place]); Lumbar spinal stenosis (5 mm Severe L3-4; 8 mm L4-5); Lumbar spondylosis; Lumbar facet syndrome (Location of Primary Source of Pain) (Bilateral) (L>R); Chronic pain syndrome; Chronic low back pain (Location of Primary Source of Pain) (Bilateral) (L>R); History of TKR (total knee replacement) (Left); History of femur fracture (Right); 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 Her primarily concern today is the Back Pain (lower) and Knee Pain (right)  Pain Assessment: Self-Reported Pain Score: 1 /10             Reported level is compatible with observation.       Pain Type: Chronic pain Pain Location: Back Pain Orientation: Right, Mid Pain Descriptors / Indicators: Aching, Constant, Radiating Pain Frequency: Constant  Ms. Koltz was last seen on 11/11/2015 for a procedure. During today's appointment we reviewed Ms. Begay's post-procedure results, as well as her outpatient medication regimen. The patient has continued to lose weight and this has helped her immensely.  Further details on both, my assessment(s), as well as the proposed treatment plan, please see below.  Controlled Substance Pharmacotherapy Assessment REMS (Risk Evaluation and Mitigation Strategy)  Analgesic: Oxycodone IR 5 mg every 6 hours (20 mg/day) MME/day: 30 mg/day Ignatius Specking, RN  01/25/2016  2:41 PM  Sign at close encounter Nursing Pain Medication Assessment:  Safety precautions to be maintained throughout the outpatient stay will include: orient to surroundings, keep bed in low position, maintain call bell within reach at all times, provide assistance with transfer out of bed and ambulation.  Medication Inspection Compliance: Pill count conducted under aseptic conditions, in front of the patient. Neither the pills nor the bottle was removed from the patient's sight at any time. Once count was completed pills were immediately returned to the patient in their original bottle. Pill Count: 0 of 120 pills remain Bottle Appearance: Standard pharmacy container. Clearly labeled. Medication: See above Filled Date: 12 / 18 / 2017  Last taken : 01/24/16 am   Pharmacokinetics: Liberation and absorption (onset of action): WNL Distribution (time to peak effect):  WNL Metabolism and excretion (duration of action): WNL  Pharmacodynamics: Desired effects: Analgesia: Ms. Pinkett reports >50% benefit. Functional ability: Patient reports that medication allows her to accomplish basic ADLs Clinically meaningful improvement in function (CMIF): Sustained CMIF goals met Perceived effectiveness: Described as relatively effective, allowing for increase in activities of daily living (ADL) Undesirable effects: Side-effects or Adverse reactions: None reported Monitoring: [place] PMP: Online review of the past 40-monthperiod conducted. Compliant with practice rules and regulations List of all UDS test(s) done:  Lab Results  Component Value Date   TOXASSSELUR FINAL 07/15/2015   TOXASSSELUR FINAL 05/01/2015   TOXASSSELUR FINAL 11/05/2014   Last UDS on record: ToxAssure Select 13  Date Value Ref Range Status  07/15/2015 FINAL  Final    Comment:    ==================================================================== TOXASSURE SELECT 13 (MW) ==================================================================== Test                             Result       Flag       Units Drug Present and Declared for Prescription Verification   Oxycodone                      806          EXPECTED   ng/mg creat   Oxymorphone                    220          EXPECTED   ng/mg creat   Noroxycodone                   4112         EXPECTED   ng/mg creat   Noroxymorphone                 170          EXPECTED   ng/mg creat    Sources of oxycodone are scheduled prescription medications.    Oxymorphone, noroxycodone, and noroxymorphone are expected    metabolites of oxycodone. Oxymorphone is also available as a    scheduled prescription medication.   Tramadol                       PRESENT      EXPECTED   O-Desmethyltramadol            PRESENT      EXPECTED   N-Desmethyltramadol            PRESENT      EXPECTED    Source of tramadol is a prescription medication.     O-desmethyltramadol and N-desmethyltramadol are expected    metabolites of tramadol. ==================================================================== Test                      Result    Flag   Units      Ref Range   Creatinine              102              mg/dL      >=20 ==================================================================== Declared Medications:  The flagging and interpretation on this report are based on the  following declared medications.  Unexpected results may arise from  inaccuracies in the declared medications.  **Note: The testing scope of this panel includes these medications:  Oxycodone (Roxicodone)  Tramadol (Ultram)  **Note: The testing scope of this panel does not include following  reported medications:  Amlodipine (Norvasc)  Aspirin (Aspirin 81)  Cholecalciferol  Duloxetine (Cymbalta)  Furosemide (Lasix)  Hydroxychloroquine (Plaquenil)  Levothyroxine  Loratadine  Metoprolol  Omeprazole (Nexium)  Quetiapine (Seroquel)  Spironolactone (Aldactone) ==================================================================== For clinical consultation, please call [phone]. ====================================================================    UDS interpretation: Compliant          Medication Assessment Form: Reviewed. Patient indicates being compliant with therapy Treatment compliance: Compliant Risk Assessment Profile: Aberrant behavior: See prior evaluations. None observed or detected today Comorbid factors increasing risk of overdose: See prior notes. No additional risks detected today Risk of substance use disorder (SUD): Low Opioid Risk Tool (ORT) Total Score: 3  Interpretation Table:  Score <3 = Low Risk for SUD  Score between 4-7 = Moderate Risk for SUD  Score >8 = High Risk for Opioid Abuse   Risk Mitigation Strategies:  Patient Counseling: Covered Patient-Prescriber Agreement (PPA): Present and active  Notification to other  healthcare providers: Done  Pharmacologic Plan: No change in therapy, at this time  Post-Procedure Assessment  11/11/2015 Procedure: Diagnostic/palliative right intra-articular knee joint injection with local anesthetic and steroids. Post-procedure pain score: 0/10 (100% relief) Influential Factors: BMI: 45.26 kg/m Intra-procedural challenges: None observed Assessment challenges: None detected         Post-procedural side-effects, adverse reactions, or complications: None reported Reported issues: None  Sedation: No sedation used. When no sedatives are used, the analgesic levels obtained are directly associated to the effectiveness of the local anesthetics. However, when sedation is provided, the level of analgesia obtained during the initial 1 hour following the intervention, is believed to be the result of a combination of factors. These factors may include, but are not limited to: 1. The effectiveness of the local anesthetics used. 2. The effects of the analgesic(s) and/or anxiolytic(s) used. 3. The degree of discomfort experienced by the patient at the time of the procedure. 4. The patients ability and reliability in recalling and recording the events. 5. The presence and influence of possible secondary gains and/or psychosocial factors. Reported result: Relief experienced during the 1st hour after the procedure: 100 % (Ultra-Short Term Relief) Interpretative annotation: Analgesia during this period is likely to be Local Anesthetic and/or IV Sedative (Analgesic/Anxiolitic) related.          Effects of local anesthetic: The analgesic effects attained during this period are directly associated to the localized infiltration of local anesthetics and therefore cary significant diagnostic value as to the etiological location, or anatomical origin, of the pain. Expected duration of relief is directly dependent on the pharmacodynamics of the local anesthetic used. Long-acting (4-6 hours)  anesthetics used.  Reported result: Relief during the next 4 to 6 hour after the procedure: 100 % (Short-Term Relief) Interpretative annotation: Complete relief would suggest area to be the source of the pain.          Long-term benefit: Defined as the period of time past the expected duration of local anesthetics. With the possible exception of prolonged sympathetic blockade from the local anesthetics, benefits during this period are typically attributed to, or associated with, other factors such as analgesic sensory neuropraxia, antiinflammatory effects, or beneficial biochemical changes provided by agents other than the local anesthetics Reported result: Extended relief following procedure: 75 % (Lasted until after Christmas) (Long-Term Relief) Interpretative annotation: Good relief. This could suggest inflammation to be a significant component in the etiology to the pain.          Current benefits: Defined as persistent relief that continues at  this point in time.   Reported results: Treated area: >50 % Ms. Slabach reports improvement in function Interpretative annotation: Ongoing benefits would suggest effective therapeutic approach  Interpretation: Results would suggest a successful palliative intervention.          Laboratory Chemistry  Inflammation Markers Lab Results  Component Value Date   ESRSEDRATE 32 (H) 07/15/2015   CRP 1.9 (H) 07/15/2015   Renal Function Lab Results  Component Value Date   BUN 21 (H) 07/15/2015   CREATININE 1.22 (H) 07/15/2015   GFRAA 51 (L) 07/15/2015   GFRNONAA 44 (L) 07/15/2015   Hepatic Function Lab Results  Component Value Date   AST 19 07/15/2015   ALT 15 07/15/2015   ALBUMIN 4.0 07/15/2015   Electrolytes Lab Results  Component Value Date   NA 140 07/15/2015   K 3.7 07/15/2015   CL 101 07/15/2015   CALCIUM 9.1 07/15/2015   MG 2.1 07/15/2015   Pain Modulating Vitamins Lab Results  Component Value Date   25OHVITD1 36 07/15/2015    25OHVITD2 3.6 07/15/2015   25OHVITD3 32 07/15/2015   VITAMINB12 610 07/15/2015   Coagulation Parameters Lab Results  Component Value Date   PLT 251 06/12/2013   Cardiovascular Lab Results  Component Value Date   HGB 13.6 06/12/2013   HCT 41.8 06/12/2013   Note: Lab results reviewed.  Recent Diagnostic Imaging Review  Mm Screening Breast Tomo Bilateral  Result Date: 11/26/2015 CLINICAL DATA:  Screening. EXAM: 2D DIGITAL SCREENING BILATERAL MAMMOGRAM WITH CAD AND ADJUNCT TOMO COMPARISON:  Previous exam(s). ACR Breast Density Category a: The breast tissue is almost entirely fatty. FINDINGS: There are no findings suspicious for malignancy. Images were processed with CAD. IMPRESSION: No mammographic evidence of malignancy. A result letter of this screening mammogram will be mailed directly to the patient. RECOMMENDATION: Screening mammogram in one year. (Code:SM-B-01Y) BI-RADS CATEGORY  1: Negative. Electronically Signed   By: Curlene Dolphin M.D.   On: 11/26/2015 15:50   Note: Imaging results reviewed.          Meds  The patient has a current medication list which includes the following prescription(s): amlodipine, aspirin ec, benzonatate, vitamin d, docusate sodium, duloxetine, esomeprazole, folic acid, furosemide, hydroxychloroquine, infliximab-dyyb, levothyroxine, levothyroxine, loratadine, methotrexate, methylprednisolone, metoprolol succinate, oxycodone, oxycodone, oxycodone, quetiapine, quetiapine, and spironolactone.  Current Outpatient Prescriptions on File Prior to Visit  Medication Sig  . amLODipine (NORVASC) 10 MG tablet Take 10 mg by mouth 2 (two) times daily.   Marland Kitchen aspirin EC 81 MG tablet Take by mouth daily.   . benzonatate (TESSALON) 200 MG capsule Take 200 mg by mouth 3 (three) times daily as needed for cough.  . Cholecalciferol (VITAMIN D) 2000 UNITS tablet Take by mouth daily.   Marland Kitchen docusate sodium (COLACE) 100 MG capsule Take 100 mg by mouth 2 (two) times daily.  .  DULoxetine (CYMBALTA) 30 MG capsule Two tablets in the morning, and one in the evening.  Marland Kitchen esomeprazole (NEXIUM) 20 MG capsule Take by mouth daily.   . folic acid (FOLVITE) 1 MG tablet Take 1 mg by mouth daily.  . furosemide (LASIX) 40 MG tablet TAKE 1 TABLET ONCE DAILY  . hydroxychloroquine (PLAQUENIL) 200 MG tablet Take 400 mg by mouth daily.  . InFLIXimab-dyyb (INFLECTRA) 100 MG SOLR Inject into the vein.  Marland Kitchen levothyroxine (SYNTHROID, LEVOTHROID) 200 MCG tablet TAKE 1 TABLET ONCE DAILY- ON AN EMPTY STOMACH WITH A GLASS OF WATER 30-60 MINUTES BEFORE BREAKFAST  . levothyroxine (SYNTHROID, LEVOTHROID) 25 MCG  tablet TAKE 1 TABLET BY MOUTH EVERY DAY (TAKE WITH 20O MCG TABLET TO EQUAL A 225 MCG DOSE)  . loratadine (CLARITIN) 10 MG tablet Take by mouth daily as needed.   . methotrexate (RHEUMATREX) 10 MG tablet Take 10 mg by mouth once a week. Caution: Chemotherapy. Protect from light.  . methylPREDNISolone (MEDROL) 4 MG TBPK tablet Follow package instructions.  . metoprolol succinate (TOPROL-XL) 50 MG 24 hr tablet TAKE 1 TABLET (50 MG TOTAL) BY MOUTH ONCE DAILY.  Marland Kitchen QUEtiapine (SEROQUEL) 200 MG tablet Take 1 tablet (200 mg total) by mouth at bedtime.  Marland Kitchen QUEtiapine (SEROQUEL) 50 MG tablet Take 1 tablet (50 mg total) by mouth 2 (two) times daily.  Marland Kitchen spironolactone (ALDACTONE) 25 MG tablet TAKE 1/2 (ONE-HALF) TABLET BY MOUTH DAILY   No current facility-administered medications on file prior to visit.    ROS  Constitutional: Denies any fever or chills Gastrointestinal: No reported hemesis, hematochezia, vomiting, or acute GI distress Musculoskeletal: Denies any acute onset joint swelling, redness, loss of ROM, or weakness Neurological: No reported episodes of acute onset apraxia, aphasia, dysarthria, agnosia, amnesia, paralysis, loss of coordination, or loss of consciousness  Allergies  Ms. Kramm is allergic to penicillins.  PFSH  Drug: Ms. Kanode  reports that she does not use drugs. Alcohol:   reports that she does not drink alcohol. Tobacco:  reports that she has never smoked. She has never used smokeless tobacco. Medical:  has a past medical history of Allergy; Anxiety; Arthritis, degenerative (10/08/2013); Chronic kidney disease; Depression; Lumbar spinal stenosis with neurogenic claudication (11/07/2014); Major depression, single episode, in complete remission ([place]) (06/25/2015); Memory loss, short term (03/19/2014); Seizure ([place]) (10/07/2014); Sleep apnea; and Thyroid disease. Family: family history includes Alcohol abuse in her father; Depression in her father and sister; Heart attack in her father; Hypertension in her mother and sister; Post-traumatic stress disorder in her father; Rheum arthritis in her sister; Stroke in her mother.  Past Surgical History:  Procedure Laterality Date  . ABDOMINAL HYSTERECTOMY    . CESAREAN SECTION    . HIP SURGERY Right   . REPLACEMENT TOTAL KNEE Left    Constitutional Exam  General appearance: Well nourished, well developed, and well hydrated. In no apparent acute distress Vitals:   01/25/16 1423  BP: (!) 140/91  Pulse: 83  Resp: 16  Temp: 97.8 F (36.6 C)  TempSrc: Oral  SpO2: 98%  Weight: 272 lb (123.4 kg)  Height: 5' 5" (1.651 m)   BMI Assessment: Estimated body mass index is 45.26 kg/m as calculated from the following:   Height as of this encounter: 5' 5" (1.651 m).   Weight as of this encounter: 272 lb (123.4 kg).  BMI interpretation table: BMI level Category Range association with higher incidence of chronic pain  <18 kg/m2 Underweight   18.5-24.9 kg/m2 Ideal body weight   25-29.9 kg/m2 Overweight Increased incidence by 20%  30-34.9 kg/m2 Obese (Class I) Increased incidence by 68%  35-39.9 kg/m2 Severe obesity (Class II) Increased incidence by 136%  >40 kg/m2 Extreme obesity (Class III) Increased incidence by 254%   BMI Readings from Last 4 Encounters:  01/25/16 45.26 kg/m  10/15/15 45.26 kg/m  07/15/15 45.26 kg/m   05/01/15 47.20 kg/m   Wt Readings from Last 4 Encounters:  01/25/16 272 lb (123.4 kg)  10/15/15 272 lb (123.4 kg)  07/15/15 272 lb (123.4 kg)  05/01/15 275 lb (124.7 kg)  Psych/Mental status: Alert, oriented x 3 (person, place, & time) Eyes: PERLA Respiratory:  No evidence of acute respiratory distress  Cervical Spine Exam  Inspection: No masses, redness, or swelling Alignment: Symmetrical Functional ROM: Unrestricted ROM Stability: No instability detected Muscle strength & Tone: Functionally intact Sensory: Unimpaired Palpation: Non-contributory  Upper Extremity (UE) Exam    Side: Right upper extremity  Side: Left upper extremity  Inspection: No masses, redness, swelling, or asymmetry  Inspection: No masses, redness, swelling, or asymmetry  Functional ROM: Unrestricted ROM          Functional ROM: Unrestricted ROM          Muscle strength & Tone: Functionally intact  Muscle strength & Tone: Functionally intact  Sensory: Unimpaired  Sensory: Unimpaired  Palpation: Non-contributory  Palpation: Non-contributory   Thoracic Spine Exam  Inspection: No masses, redness, or swelling Alignment: Symmetrical Functional ROM: Unrestricted ROM Stability: No instability detected Sensory: Unimpaired Muscle strength & Tone: Functionally intact Palpation: Non-contributory  Lumbar Spine Exam  Inspection: No masses, redness, or swelling Alignment: Symmetrical Functional ROM: Unrestricted ROM Stability: No instability detected Muscle strength & Tone: Functionally intact Sensory: Unimpaired Palpation: Non-contributory Provocative Tests: Lumbar Hyperextension and rotation test: evaluation deferred today       Patrick's Maneuver: evaluation deferred today              Gait & Posture Assessment  Ambulation: Patient ambulates using a walker Gait: Limited. Using assistive device to ambulate Posture: WNL   Lower Extremity Exam    Side: Right lower extremity  Side: Left lower extremity   Inspection: No masses, redness, swelling, or asymmetry  Inspection: No masses, redness, swelling, or asymmetry  Functional ROM: Unrestricted ROM          Functional ROM: Unrestricted ROM          Muscle strength & Tone: Functionally intact  Muscle strength & Tone: Functionally intact  Sensory: Unimpaired  Sensory: Unimpaired  Palpation: Non-contributory  Palpation: Non-contributory   Assessment  Primary Diagnosis & Pertinent Problem List: The primary encounter diagnosis was Chronic pain syndrome. Diagnoses of Chronic knee pain (Right), Primary osteoarthritis of both knees, and Primary osteoarthritis of right knee were also pertinent to this visit.  Status Diagnosis  Controlled Controlled Controlled 1. Chronic pain syndrome   2. Chronic knee pain (Right)   3. Primary osteoarthritis of both knees   4. Primary osteoarthritis of right knee      Plan of Care  Pharmacotherapy (Medications Ordered): Meds ordered this encounter  Medications  . oxyCODONE (OXY IR/ROXICODONE) 5 MG immediate release tablet    Sig: Take 1 tablet (5 mg total) by mouth every 6 (six) hours as needed for severe pain.    Dispense:  120 tablet    Refill:  0    Do not add this medication to the electronic "Automatic Refill" notification system. Patient may have prescription filled one day early if pharmacy is closed on scheduled refill date. Do not fill until: 02/25/16 To last until: 03/26/16  . oxyCODONE (OXY IR/ROXICODONE) 5 MG immediate release tablet    Sig: Take 1 tablet (5 mg total) by mouth every 6 (six) hours as needed for severe pain.    Dispense:  120 tablet    Refill:  0    Do not add this medication to the electronic "Automatic Refill" notification system. Patient may have prescription filled one day early if pharmacy is closed on scheduled refill date. Do not fill until: 03/26/16 To last until: 04/25/16  . oxyCODONE (OXY IR/ROXICODONE) 5 MG immediate release tablet    Sig:  Take 1 tablet (5 mg total)  by mouth every 6 (six) hours as needed for severe pain.    Dispense:  120 tablet    Refill:  0    Do not add this medication to the electronic "Automatic Refill" notification system. Patient may have prescription filled one day early if pharmacy is closed on scheduled refill date. Do not fill until: 01/26/16 To last until: 02/25/16   New Prescriptions   No medications on file   Medications administered today: Ms. Dionisio had no medications administered during this visit. Lab-work, procedure(s), and/or referral(s): Orders Placed This Encounter  Procedures  . KNEE INJECTION   Imaging and/or referral(s): None  Interventional therapies: Planned, scheduled, and/or pending:   Not at this time.   Considering:  Therapeutic series of 5 Hyalgan right knee injections under fluoroscopic guidance, no sedation Diagnostic intra-articular left hip joint injection under fluoroscopic guidance, with or without sedation.  Possible left hip radiofrequency ablation under fluoroscopic guidance and IV sedation.  Diagnostic right intra-articular knee joint injection with local anesthetic and steroid under fluoroscopic guidance, no sedation. Diagnostic bilateral genicular nerve block under fluoroscopic guidance, with or without sedation.  Possible bilateral genicular nerve radiofrequency ablation under fluoroscopic guidance and IV sedation.  Diagnostic bilateral lumbar facet block under fluoroscopic guidance and IV sedation.  Possible bilateral lumbar facet radiofrequency ablation under fluoroscopic guidance and IV sedation.  Diagnostic bilateral L3-4 and/or L5-S1 transforaminal epidural steroid injections under fluoroscopic guidance, with or without IV sedation.  Diagnostic L3-4 versus L4-5 lumbar epidural steroid injection under fluoroscopic guidance, with or without IV sedation.   Palliative PRN treatment(s):   Therapeutic series of 5 Hyalgan right knee injections under fluoroscopic guidance, no  sedation Diagnostic intra-articular left hip joint injection under fluoroscopic guidance, with or without sedation.  Diagnostic right intra-articular knee joint injection with local anesthetic and steroid under fluoroscopic guidance, no sedation.  Diagnostic bilateral genicular nerve block under fluoroscopic guidance, with or without sedation.  Diagnostic bilateral lumbar facet block under fluoroscopic guidance and IV sedation.  Diagnostic bilateral L3-4 and/or L5-S1 transforaminal epidural steroid injections under fluoroscopic guidance, with or without IV sedation.  Diagnostic L3-4 versus L4-5 lumbar epidural steroid injection under fluoroscopic guidance, with or without IV sedation.    Provider-requested follow-up: Return in about 3 months (around 04/24/2016) for (MD) Med-Mgmt, (PRN) procedure.  Future Appointments Date Time Provider [place]  03/21/2016 2:00 PM Elvin So, MD [facility] None  04/21/2016 1:15 PM Milinda Pointer, MD [facility] None   Primary Care Physician: Glendon Axe, MD Location: [facility] Note by: Kathlen Brunswick. Dossie Arbour, M.D, DABA, DABAPM, DABPM, DABIPP, FIPP Date: 01/25/2016; Time: 5:25 PM  Pain Score Disclaimer: We use the NRS-11 scale. This is a self-reported, subjective measurement of pain severity with only modest accuracy. It is used primarily to identify changes within a particular patient. It must be understood that outpatient pain scales are significantly less accurate that those used for research, where they can be applied under ideal controlled circumstances with minimal exposure to variables. In reality, the score is likely to be a combination of pain intensity and pain affect, where pain affect describes the degree of emotional arousal or changes in action readiness caused by the sensory experience of pain. Factors such as social and work situation, setting, emotional state, anxiety levels, expectation, and prior pain  experience may influence pain perception and show large inter-individual differences that may also be affected by time variables.  Patient instructions provided during this appointment: Patient  Instructions  Pain Management Discharge Instructions  General Discharge Instructions :  If you need to reach your doctor call: Monday-Friday 8:00 am - 4:00 pm at [phone] or toll free [phone].  After clinic hours [phone] to have operator reach doctor.  Bring all of your medication bottles to all your appointments in the pain clinic.  To cancel or reschedule your appointment with Pain Management please remember to call 24 hours in advance to avoid a fee.  Refer to the educational materials which you have been given on: General Risks, I had my Procedure. Discharge Instructions, Post Sedation.  Post Procedure Instructions:  The drugs you were given will stay in your system until tomorrow, so for the next 24 hours you should not drive, make any legal decisions or drink any alcoholic beverages.  You may eat anything you prefer, but it is better to start with liquids then soups and crackers, and gradually work up to solid foods.  Please notify your doctor immediately if you have any unusual bleeding, trouble breathing or pain that is not related to your normal pain.  Depending on the type of procedure that was done, some parts of your body may feel week and/or numb.  This usually clears up by tonight or the next day.  Walk with the use of an assistive device or accompanied by an adult for the 24 hours.  You may use ice on the affected area for the first 24 hours.  Put ice in a Ziploc bag and cover with a towel and place against area 15 minutes on 15 minutes off.  You may switch to heat after 24 hours.

## 2016-03-21 ENCOUNTER — Ambulatory Visit: Payer: Medicare Other | Admitting: Psychiatry

## 2016-04-21 ENCOUNTER — Ambulatory Visit: Payer: Medicare Other | Attending: Pain Medicine | Admitting: Pain Medicine

## 2016-04-21 ENCOUNTER — Encounter: Payer: Self-pay | Admitting: Pain Medicine

## 2016-04-21 VITALS — BP 158/86 | HR 61 | Temp 98.1°F | Resp 18 | Ht 65.0 in | Wt 272.0 lb

## 2016-04-21 DIAGNOSIS — Z96652 Presence of left artificial knee joint: Secondary | ICD-10-CM | POA: Insufficient documentation

## 2016-04-21 DIAGNOSIS — N183 Chronic kidney disease, stage 3 (moderate): Secondary | ICD-10-CM | POA: Insufficient documentation

## 2016-04-21 DIAGNOSIS — F1190 Opioid use, unspecified, uncomplicated: Secondary | ICD-10-CM

## 2016-04-21 DIAGNOSIS — Z8249 Family history of ischemic heart disease and other diseases of the circulatory system: Secondary | ICD-10-CM | POA: Insufficient documentation

## 2016-04-21 DIAGNOSIS — K5900 Constipation, unspecified: Secondary | ICD-10-CM | POA: Diagnosis not present

## 2016-04-21 DIAGNOSIS — Z823 Family history of stroke: Secondary | ICD-10-CM | POA: Insufficient documentation

## 2016-04-21 DIAGNOSIS — M4696 Unspecified inflammatory spondylopathy, lumbar region: Secondary | ICD-10-CM | POA: Diagnosis not present

## 2016-04-21 DIAGNOSIS — M160 Bilateral primary osteoarthritis of hip: Secondary | ICD-10-CM | POA: Insufficient documentation

## 2016-04-21 DIAGNOSIS — F411 Generalized anxiety disorder: Secondary | ICD-10-CM | POA: Diagnosis not present

## 2016-04-21 DIAGNOSIS — I129 Hypertensive chronic kidney disease with stage 1 through stage 4 chronic kidney disease, or unspecified chronic kidney disease: Secondary | ICD-10-CM | POA: Insufficient documentation

## 2016-04-21 DIAGNOSIS — Z90710 Acquired absence of both cervix and uterus: Secondary | ICD-10-CM | POA: Diagnosis not present

## 2016-04-21 DIAGNOSIS — T402X5A Adverse effect of other opioids, initial encounter: Secondary | ICD-10-CM | POA: Diagnosis not present

## 2016-04-21 DIAGNOSIS — Z7982 Long term (current) use of aspirin: Secondary | ICD-10-CM | POA: Diagnosis not present

## 2016-04-21 DIAGNOSIS — Z79891 Long term (current) use of opiate analgesic: Secondary | ICD-10-CM | POA: Diagnosis not present

## 2016-04-21 DIAGNOSIS — E039 Hypothyroidism, unspecified: Secondary | ICD-10-CM | POA: Diagnosis not present

## 2016-04-21 DIAGNOSIS — M1711 Unilateral primary osteoarthritis, right knee: Secondary | ICD-10-CM | POA: Diagnosis not present

## 2016-04-21 DIAGNOSIS — G894 Chronic pain syndrome: Secondary | ICD-10-CM

## 2016-04-21 DIAGNOSIS — K219 Gastro-esophageal reflux disease without esophagitis: Secondary | ICD-10-CM | POA: Insufficient documentation

## 2016-04-21 DIAGNOSIS — Z880 Allergy status to penicillin: Secondary | ICD-10-CM | POA: Insufficient documentation

## 2016-04-21 DIAGNOSIS — E785 Hyperlipidemia, unspecified: Secondary | ICD-10-CM | POA: Insufficient documentation

## 2016-04-21 DIAGNOSIS — M170 Bilateral primary osteoarthritis of knee: Secondary | ICD-10-CM | POA: Insufficient documentation

## 2016-04-21 DIAGNOSIS — M25561 Pain in right knee: Secondary | ICD-10-CM

## 2016-04-21 DIAGNOSIS — M5442 Lumbago with sciatica, left side: Secondary | ICD-10-CM

## 2016-04-21 DIAGNOSIS — M5441 Lumbago with sciatica, right side: Secondary | ICD-10-CM | POA: Diagnosis not present

## 2016-04-21 DIAGNOSIS — M48062 Spinal stenosis, lumbar region with neurogenic claudication: Secondary | ICD-10-CM | POA: Insufficient documentation

## 2016-04-21 DIAGNOSIS — K5903 Drug induced constipation: Secondary | ICD-10-CM | POA: Diagnosis not present

## 2016-04-21 DIAGNOSIS — Z6841 Body Mass Index (BMI) 40.0 and over, adult: Secondary | ICD-10-CM | POA: Insufficient documentation

## 2016-04-21 DIAGNOSIS — G8929 Other chronic pain: Secondary | ICD-10-CM

## 2016-04-21 DIAGNOSIS — Z818 Family history of other mental and behavioral disorders: Secondary | ICD-10-CM | POA: Insufficient documentation

## 2016-04-21 DIAGNOSIS — M47816 Spondylosis without myelopathy or radiculopathy, lumbar region: Secondary | ICD-10-CM

## 2016-04-21 DIAGNOSIS — Z8261 Family history of arthritis: Secondary | ICD-10-CM | POA: Insufficient documentation

## 2016-04-21 DIAGNOSIS — Z811 Family history of alcohol abuse and dependence: Secondary | ICD-10-CM | POA: Insufficient documentation

## 2016-04-21 MED ORDER — OXYCODONE HCL 5 MG PO TABS: 5.0000 mg | ORAL_TABLET | Freq: Four times a day (QID) | ORAL | 0 refills | Status: DC | PRN

## 2016-04-21 MED ORDER — LUBIPROSTONE 24 MCG PO CAPS: 24.0000 ug | ORAL_CAPSULE | Freq: Two times a day (BID) | ORAL | 0 refills | Status: DC

## 2016-04-21 NOTE — Patient Instructions (Signed)

## 2016-04-21 NOTE — Progress Notes (Signed)
[name]  MRN: [id number]  Referring Provider: Glendon Axe, MD  DOB: [birth year]  PCP: Glendon Axe, MD  DOS: 04/21/2016  Note by: Kathlen Brunswick. Dossie Arbour, MD  Service setting: Ambulatory outpatient  Specialty: Interventional Pain Management  Location: [facility] (AMB) Pain Management Facility    Patient type: Established   Primary Reason(s) for Visit: Encounter for prescription drug management (Level of risk: moderate) CC: No chief complaint on file.  HPI  Laura [name] is a 71 y.o. year old, female patient, who comes today for a medication management evaluation. She has Chronic kidney disease (CKD), stage III (moderate); Depression; Essential (primary) hypertension; GERD (gastroesophageal reflux disease); Adult hypothyroidism; HLD (hyperlipidemia); Extreme obesity ([place]); Insomnia secondary to chronic pain; Congestive heart failure with left ventricular systolic dysfunction ([place]); Type 2 diabetes mellitus ([place]); Morbid obesity ([place]); Seronegative rheumatoid arthritis ([place]); Chronic knee pain (Right); Opiate use (30 MME/Day); Long term current use of opiate analgesic; Long term prescription opiate use; Encounter for therapeutic drug level monitoring; Opioid dependence ([place]); Lumbar spinal stenosis (5 mm Severe L3-4; 8 mm L4-5); Lumbar spondylosis; Lumbar facet syndrome (Location of Primary Source of Pain) (Bilateral) (L>R); Chronic pain syndrome; Chronic low back pain (Location of Primary Source of Pain) (Bilateral) (L>R); History of TKR (total knee replacement) (Left); History of femur fracture (Right); 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 Her primarily concern today is the No chief complaint on file.  Pain Assessment: Self-Reported Pain Score: 2 /10             Reported level is compatible with observation.       Pain Type: Chronic pain Pain Location: Knee Pain Orientation: Right, Left Pain Descriptors / Indicators: Aching Pain Frequency: Constant  Laura [name] was last scheduled for an appointment on 01/25/2016 for medication management. During today's appointment we reviewed Laura [name]'s chronic pain status, as well as her outpatient medication regimen.  The patient  reports that she does not use drugs. Her body mass index is 45.26 kg/m.  Further details on both, my assessment(s), as well as the proposed treatment plan, please see below.  Controlled Substance Pharmacotherapy Assessment REMS (Risk Evaluation and Mitigation Strategy)  Analgesic:Oxycodone IR 5 mg every 6 hours (20 mg/day) MME/day:30 mg/day Zenovia Jarred, RN  04/21/2016  1:40 PM  Addendum Nursing Pain Medication Assessment:  Safety precautions to be maintained throughout the outpatient stay will include: orient to surroundings, keep bed in low position, maintain call bell within reach at all times, provide assistance with transfer out of bed and ambulation.  Medication Inspection Compliance: Laura [name] did not comply with our request to bring her pills to be counted. She was reminded that bringing the medication bottles, even when empty, is a requirement. Pill/Patch Count: None available to be counted. Bottle Appearance: No container available. Did not bring bottle(s) to appointment. Medication: None brought in. Filled Date: N/A Last Medication intake:  Day before yesterday  Reminded to bring pills to all appoijntments.   Pharmacokinetics: Liberation and absorption (onset of action): WNL Distribution (time to peak effect): WNL Metabolism and excretion (duration of action): WNL          Pharmacodynamics: Desired effects: Analgesia: Laura [name] reports >50% benefit. Functional ability: Patient reports  that medication allows her to accomplish basic ADLs Clinically meaningful improvement in function (CMIF): Sustained CMIF goals met Perceived effectiveness: Described as relatively effective, allowing for increase in activities of daily living (ADL) Undesirable effects: Side-effects or Adverse reactions: None reported Monitoring: [place] PMP: Online review of the past 10-monthperiod conducted. Compliant with practice rules and regulations List of all UDS test(s) done:  Lab Results  Component Value Date   TOXASSSELUR FINAL 07/15/2015   TOXASSSELUR FINAL 05/01/2015   TOXASSSELUR FINAL 11/05/2014   Last UDS on record: ToxAssure Select 13  Date Value Ref Range Status  07/15/2015 FINAL  Final    Comment:    ==================================================================== TOXASSURE SELECT 13 (MW) ==================================================================== Test                             Result       Flag       Units Drug Present and Declared for Prescription Verification   Oxycodone                      806          EXPECTED   ng/mg creat   Oxymorphone                    220          EXPECTED   ng/mg creat   Noroxycodone                   4112         EXPECTED   ng/mg creat   Noroxymorphone                 170          EXPECTED   ng/mg creat    Sources of oxycodone are scheduled prescription medications.    Oxymorphone, noroxycodone, and noroxymorphone are expected    metabolites of oxycodone. Oxymorphone is also available as a    scheduled prescription medication.   Tramadol                       PRESENT      EXPECTED   O-Desmethyltramadol            PRESENT      EXPECTED   N-Desmethyltramadol            PRESENT      EXPECTED    Source of tramadol is a prescription medication.    O-desmethyltramadol and N-desmethyltramadol are expected    metabolites of  tramadol. ==================================================================== Test                      Result    Flag   Units      Ref Range   Creatinine              102              mg/dL      >=20 ==================================================================== Declared Medications:  The flagging and interpretation on this report are based on the  following declared medications.  Unexpected results may arise from  inaccuracies in the declared medications.  **Note: The testing scope of this panel includes these medications:  Oxycodone (Roxicodone)  Tramadol (Ultram)  **Note: The testing scope of this panel does not include following  reported medications:  Amlodipine (Norvasc)  Aspirin (Aspirin 81)  Cholecalciferol  Duloxetine (  Cymbalta)  Furosemide (Lasix)  Hydroxychloroquine (Plaquenil)  Levothyroxine  Loratadine  Metoprolol  Omeprazole (Nexium)  Quetiapine (Seroquel)  Spironolactone (Aldactone) ==================================================================== For clinical consultation, please call [phone]. ====================================================================    UDS interpretation: Compliant          Medication Assessment Form: Reviewed. Patient indicates being compliant with therapy Treatment compliance: Compliant Risk Assessment Profile: Aberrant behavior: See prior evaluations. None observed or detected today Comorbid factors increasing risk of overdose: See prior notes. No additional risks detected today Risk of substance use disorder (SUD): Low Opioid Risk Tool (ORT) Total Score: 0  Interpretation Table:  Score <3 = Low Risk for SUD  Score between 4-7 = Moderate Risk for SUD  Score >8 = High Risk for Opioid Abuse   Risk Mitigation Strategies:  Patient Counseling: Covered Patient-Prescriber Agreement (PPA): Present and active  Notification to other healthcare providers: Done  Pharmacologic Plan: No change in therapy, at this  time  Laboratory Chemistry  Inflammation Markers Lab Results  Component Value Date   CRP 1.9 (H) 07/15/2015   ESRSEDRATE 32 (H) 07/15/2015   (CRP: Acute Phase) (ESR: Chronic Phase) Renal Function Markers Lab Results  Component Value Date   BUN 21 (H) 07/15/2015   CREATININE 1.22 (H) 07/15/2015   GFRAA 51 (L) 07/15/2015   GFRNONAA 44 (L) 07/15/2015   Hepatic Function Markers Lab Results  Component Value Date   AST 19 07/15/2015   ALT 15 07/15/2015   ALBUMIN 4.0 07/15/2015   ALKPHOS 73 07/15/2015   Electrolytes Lab Results  Component Value Date   NA 140 07/15/2015   K 3.7 07/15/2015   CL 101 07/15/2015   CALCIUM 9.1 07/15/2015   MG 2.1 07/15/2015   Neuropathy Markers Lab Results  Component Value Date   VITAMINB12 610 07/15/2015   Bone Pathology Markers Lab Results  Component Value Date   ALKPHOS 73 07/15/2015   25OHVITD1 36 07/15/2015   25OHVITD2 3.6 07/15/2015   25OHVITD3 32 07/15/2015   CALCIUM 9.1 07/15/2015   Coagulation Parameters Lab Results  Component Value Date   PLT 251 06/12/2013   Cardiovascular Markers Lab Results  Component Value Date   HGB 13.6 06/12/2013   HCT 41.8 06/12/2013   Note: Lab results reviewed.  Recent Diagnostic Imaging Review  Mm Screening Breast Tomo Bilateral  Result Date: 11/26/2015 CLINICAL DATA:  Screening. EXAM: 2D DIGITAL SCREENING BILATERAL MAMMOGRAM WITH CAD AND ADJUNCT TOMO COMPARISON:  Previous exam(s). ACR Breast Density Category a: The breast tissue is almost entirely fatty. FINDINGS: There are no findings suspicious for malignancy. Images were processed with CAD. IMPRESSION: No mammographic evidence of malignancy. A result letter of this screening mammogram will be mailed directly to the patient. RECOMMENDATION: Screening mammogram in one year. (Code:SM-B-01Y) BI-RADS CATEGORY  1: Negative. Electronically Signed   By: Curlene Dolphin M.D.   On: 11/26/2015 15:50   Note: Imaging results reviewed.          Meds   The patient has a current medication list which includes the following prescription(s): amlodipine, aspirin ec, chlorthalidone, vitamin d, docusate sodium, duloxetine, esomeprazole, folic acid, furosemide, hydroxychloroquine, infliximab-dyyb, levothyroxine, levothyroxine, loratadine, lubiprostone, methotrexate, metoprolol succinate, multi-vitamins, oxycodone, oxycodone, oxycodone, quetiapine, quetiapine, spironolactone, and tramadol.  Current Outpatient Prescriptions on File Prior to Visit  Medication Sig  . amLODipine (NORVASC) 10 MG tablet Take 10 mg by mouth 2 (two) times daily.   . Cholecalciferol (VITAMIN D) 2000 UNITS tablet Take by mouth daily.   Marland Kitchen docusate sodium (COLACE) 100 MG capsule  Take 100 mg by mouth 2 (two) times daily.  . DULoxetine (CYMBALTA) 30 MG capsule Two tablets in the morning, and one in the evening.  Marland Kitchen esomeprazole (NEXIUM) 20 MG capsule Take by mouth daily.   . folic acid (FOLVITE) 1 MG tablet Take 1 mg by mouth daily.  . furosemide (LASIX) 40 MG tablet TAKE 1 TABLET ONCE DAILY  . hydroxychloroquine (PLAQUENIL) 200 MG tablet Take 200 mg by mouth daily.   . InFLIXimab-dyyb (INFLECTRA) 100 MG SOLR Inject into the vein every 6 (six) weeks.   Marland Kitchen levothyroxine (SYNTHROID, LEVOTHROID) 200 MCG tablet TAKE 1 TABLET ONCE DAILY- ON AN EMPTY STOMACH WITH A GLASS OF WATER 30-60 MINUTES BEFORE BREAKFAST  . levothyroxine (SYNTHROID, LEVOTHROID) 25 MCG tablet TAKE 2 TABLET BY MOUTH EVERY DAY (TAKE WITH 20O MCG TABLET TO EQUAL A 225 MCG DOSE)  . loratadine (CLARITIN) 10 MG tablet Take by mouth daily as needed.   . metoprolol succinate (TOPROL-XL) 50 MG 24 hr tablet TAKE 1 TABLET (50 MG TOTAL) BY MOUTH ONCE DAILY.  Marland Kitchen QUEtiapine (SEROQUEL) 200 MG tablet Take 1 tablet (200 mg total) by mouth at bedtime.  Marland Kitchen QUEtiapine (SEROQUEL) 50 MG tablet Take 1 tablet (50 mg total) by mouth 2 (two) times daily.  Marland Kitchen spironolactone (ALDACTONE) 25 MG tablet TAKE 1/2 (ONE-HALF) TABLET BY MOUTH DAILY   No  current facility-administered medications on file prior to visit.    ROS  Constitutional: Denies any fever or chills Gastrointestinal: No reported hemesis, hematochezia, vomiting, or acute GI distress Musculoskeletal: Denies any acute onset joint swelling, redness, loss of ROM, or weakness Neurological: No reported episodes of acute onset apraxia, aphasia, dysarthria, agnosia, amnesia, paralysis, loss of coordination, or loss of consciousness  Allergies  Laura [name] is allergic to penicillins.  PFSH  Drug: Laura [name]  reports that she does not use drugs. Alcohol:  reports that she does not drink alcohol. Tobacco:  reports that she has never smoked. She has never used smokeless tobacco. Medical:  has a past medical history of Allergy; Anxiety; Arthritis, degenerative (10/08/2013); Chronic kidney disease; Depression; Lumbar spinal stenosis with neurogenic claudication (11/07/2014); Major depression, single episode, in complete remission ([place]) (06/25/2015); Memory loss, short term (03/19/2014); Seizure ([place]) (10/07/2014); Sleep apnea; and Thyroid disease. Family: family history includes Alcohol abuse in her father; Depression in her father and sister; Heart attack in her father; Hypertension in her mother and sister; Post-traumatic stress disorder in her father; Rheum arthritis in her sister; Stroke in her mother.  Past Surgical History:  Procedure Laterality Date  . ABDOMINAL HYSTERECTOMY    . CESAREAN SECTION    . HIP SURGERY Right   . REPLACEMENT TOTAL KNEE Left    Constitutional Exam  General appearance: Well nourished, well developed, and well hydrated. In no apparent acute distress Vitals:   04/21/16 1327  BP: (!) 158/86  Pulse: 61  Resp: 18  Temp: 98.1 F (36.7 C)  SpO2: 90%  Weight: 272 lb (123.4 kg)  Height: '5\' 5"'  (1.651 m)   BMI Assessment: Estimated body mass index is 45.26 kg/m as calculated from the following:   Height as of this encounter: '5\' 5"'  (1.651 m).   Weight as  of this encounter: 272 lb (123.4 kg).  BMI interpretation table: BMI level Category Range association with higher incidence of chronic pain  <18 kg/m2 Underweight   18.5-24.9 kg/m2 Ideal body weight   25-29.9 kg/m2 Overweight Increased incidence by 20%  30-34.9 kg/m2 Obese (Class I) Increased incidence by  68%  35-39.9 kg/m2 Severe obesity (Class II) Increased incidence by 136%  >40 kg/m2 Extreme obesity (Class III) Increased incidence by 254%   BMI Readings from Last 4 Encounters:  04/21/16 45.26 kg/m  01/25/16 45.26 kg/m  10/15/15 45.26 kg/m  07/15/15 45.26 kg/m   Wt Readings from Last 4 Encounters:  04/21/16 272 lb (123.4 kg)  01/25/16 272 lb (123.4 kg)  10/15/15 272 lb (123.4 kg)  07/15/15 272 lb (123.4 kg)  Psych/Mental status: Alert, oriented x 3 (person, place, & time)       Eyes: PERLA Respiratory: No evidence of acute respiratory distress  Cervical Spine Exam  Inspection: No masses, redness, or swelling Alignment: Symmetrical Functional ROM: Unrestricted ROM Stability: No instability detected Muscle strength & Tone: Functionally intact Sensory: Unimpaired Palpation: No palpable anomalies  Upper Extremity (UE) Exam    Side: Right upper extremity  Side: Left upper extremity  Inspection: No masses, redness, swelling, or asymmetry. No contractures  Inspection: No masses, redness, swelling, or asymmetry. No contractures  Functional ROM: Unrestricted ROM          Functional ROM: Unrestricted ROM          Muscle strength & Tone: Functionally intact  Muscle strength & Tone: Functionally intact  Sensory: Unimpaired  Sensory: Unimpaired  Palpation: No palpable anomalies  Palpation: No palpable anomalies  Specialized Test(s): Deferred         Specialized Test(s): Deferred          Thoracic Spine Exam  Inspection: No masses, redness, or swelling Alignment: Symmetrical Functional ROM: Unrestricted ROM Stability: No instability detected Sensory: Unimpaired Muscle  strength & Tone: No palpable anomalies  Lumbar Spine Exam  Inspection: No masses, redness, or swelling Alignment: Symmetrical Functional ROM: Unrestricted ROM Stability: No instability detected Muscle strength & Tone: Functionally intact Sensory: Unimpaired Palpation: No palpable anomalies Provocative Tests: Lumbar Hyperextension and rotation test: evaluation deferred today       Patrick's Maneuver: evaluation deferred today              Gait & Posture Assessment  Ambulation: Unassisted Gait: Relatively normal for age and body habitus Posture: WNL   Lower Extremity Exam    Side: Right lower extremity  Side: Left lower extremity  Inspection: No masses, redness, swelling, or asymmetry. No contractures  Inspection: No masses, redness, swelling, or asymmetry. No contractures  Functional ROM: Unrestricted ROM          Functional ROM: Unrestricted ROM          Muscle strength & Tone: Functionally intact  Muscle strength & Tone: Functionally intact  Sensory: Unimpaired  Sensory: Unimpaired  Palpation: No palpable anomalies  Palpation: No palpable anomalies   Assessment  Primary Diagnosis & Pertinent Problem List: The primary encounter diagnosis was Chronic knee pain (Right). Diagnoses of Chronic low back pain (Location of Primary Source of Pain) (Bilateral) (L>R), Lumbar facet syndrome (Location of Primary Source of Pain) (Bilateral) (L>R), Primary osteoarthritis of right knee, Opioid-induced constipation (OIC), Chronic pain syndrome, Long term prescription opiate use, and Opiate use (30 MME/Day) were also pertinent to this visit.  Status Diagnosis  Controlled Controlled Controlled 1. Chronic knee pain (Right)   2. Chronic low back pain (Location of Primary Source of Pain) (Bilateral) (L>R)   3. Lumbar facet syndrome (Location of Primary Source of Pain) (Bilateral) (L>R)   4. Primary osteoarthritis of right knee   5. Opioid-induced constipation (OIC)   6. Chronic pain syndrome   7.  Long term  prescription opiate use   8. Opiate use (30 MME/Day)      Plan of Care  Pharmacotherapy (Medications Ordered): Meds ordered this encounter  Medications  . lubiprostone (AMITIZA) 24 MCG capsule    Sig: Take 1 capsule (24 mcg total) by mouth 2 (two) times daily with a meal. Swallow the medication whole. Do not break or chew the medication.    Dispense:  180 capsule    Refill:  0    Do not place this medication, or any other prescription from our practice, on "Automatic Refill". Patient may have prescription filled one day early if pharmacy is closed on scheduled refill date.  Marland Kitchen oxyCODONE (OXY IR/ROXICODONE) 5 MG immediate release tablet    Sig: Take 1 tablet (5 mg total) by mouth every 6 (six) hours as needed for severe pain.    Dispense:  120 tablet    Refill:  0    Do not add this medication to the electronic "Automatic Refill" notification system. Patient may have prescription filled one day early if pharmacy is closed on scheduled refill date. Do not fill until: 06/24/16 To last until: 07/24/16  . oxyCODONE (OXY IR/ROXICODONE) 5 MG immediate release tablet    Sig: Take 1 tablet (5 mg total) by mouth every 6 (six) hours as needed for severe pain.    Dispense:  120 tablet    Refill:  0    Do not add this medication to the electronic "Automatic Refill" notification system. Patient may have prescription filled one day early if pharmacy is closed on scheduled refill date. Do not fill until: 04/25/16 To last until: 05/25/16  . oxyCODONE (OXY IR/ROXICODONE) 5 MG immediate release tablet    Sig: Take 1 tablet (5 mg total) by mouth every 6 (six) hours as needed for severe pain.    Dispense:  120 tablet    Refill:  0    Do not add this medication to the electronic "Automatic Refill" notification system. Patient may have prescription filled one day early if pharmacy is closed on scheduled refill date. Do not fill until: 05/25/16 To last until: 06/24/16   New Prescriptions    LUBIPROSTONE (AMITIZA) 24 MCG CAPSULE    Take 1 capsule (24 mcg total) by mouth 2 (two) times daily with a meal. Swallow the medication whole. Do not break or chew the medication.   Medications administered today: Laura [name] had no medications administered during this visit. Lab-work, procedure(s), and/or referral(s): No orders of the defined types were placed in this encounter.  Imaging and/or referral(s): None  Interventional therapies: Planned, scheduled, and/or pending:   Not at this time.   Considering:   Therapeutic series of 5 Hyalgan right knee injections  Diagnostic intra-articular left hip joint injection  Possible left hip radiofrequency ablation  Diagnostic right intra-articular knee joint injection with local anesthetic and steroid  Diagnostic bilateral genicular nerve block  Possible bilateral genicular nerve radiofrequency ablation  Diagnostic bilateral lumbar facet block  Possible bilateral lumbar facet radiofrequency ablation  Diagnostic bilateral L3-4 and/or L5-S1 transforaminal epidural steroid injections  Diagnostic L3-4 versus L4-5 lumbar epidural steroid injection    Palliative PRN treatment(s):   Therapeutic series of 5 Hyalgan right knee injections Diagnostic intra-articular left hip joint injection  Diagnostic right intra-articular knee joint injection with local anesthetic and steroid  Diagnostic bilateral genicular nerve block  Diagnostic bilateral lumbar facet block  Diagnostic bilateral L3-4 and/or L5-S1 transforaminal epidural steroid injections  Diagnostic L3-4 versus L4-5 lumbar epidural steroid injection  Provider-requested follow-up: Return in about 3 months (around 07/11/2016) for (Nurse Practitioner) Med-Mgmt, in addition, (PRN) procedure.  Future Appointments Date Time Provider [place]  04/26/2016 2:00 PM Elvin So, MD [facility] None  07/11/2016 1:15 PM [place], NP [facility] None   Primary Care Physician: Glendon Axe, MD Location: [facility] Note by: Kathlen Brunswick. Dossie Arbour, M.D, DABA, DABAPM, DABPM, DABIPP, FIPP Date: 04/21/2016; Time: 1:56 PM  Pain Score Disclaimer: We use the NRS-11 scale. This is a self-reported, subjective measurement of pain severity with only modest accuracy. It is used primarily to identify changes within a particular patient. It must be understood that outpatient pain scales are significantly less accurate that those used for research, where they can be applied under ideal controlled circumstances with minimal exposure to variables. In reality, the score is likely to be a combination of pain intensity and pain affect, where pain affect describes the degree of emotional arousal or changes in action readiness caused by the sensory experience of pain. Factors such as social and work situation, setting, emotional state, anxiety levels, expectation, and prior pain experience may influence pain perception and show large inter-individual differences that may also be affected by time variables.  Patient instructions provided during this appointment: Patient Instructions   Pain Score  Introduction: The pain score used by this practice is the Verbal Numerical Rating Scale (VNRS-11). This is an 11-point scale. It is for adults and children 10 years or older. There are significant differences in how the pain score is reported, used, and applied. Forget everything you learned in the past and learn this scoring system.  General Information: The scale should reflect your current level of pain. Unless you are specifically asked for the level of your worst pain, or your average pain. If you are asked for one of these two, then it should be understood that it is over the past 24 hours.  Basic Activities of Daily Living (ADL): Personal hygiene, dressing, eating, transferring, and using restroom.  Instructions: Most patients tend to report their level of pain as a  combination of two factors, their physical pain and their psychosocial pain. This last one is also known as "suffering" and it is reflection of how physical pain affects you socially and psychologically. From now on, report them separately. From this point on, when asked to report your pain level, report only your physical pain. Use the following table for reference.  Pain Clinic Pain Levels (0-5/10)  Pain Level Score Description  No Pain 0   Mild pain 1 Nagging, annoying, but does not interfere with basic activities of daily living (ADL). Patients are able to eat, bathe, get dressed, toileting (being able to get on and off the toilet and perform personal hygiene functions), transfer (move in and out of bed or a chair without assistance), and maintain continence (able to control bladder and bowel functions). Blood pressure and heart rate are unaffected. A normal heart rate for a healthy adult ranges from 60 to 100 bpm (beats per minute).   Mild to moderate pain 2 Noticeable and distracting. Impossible to hide from other people. More frequent flare-ups. Still possible to adapt and function close to normal. It can be very annoying and may have occasional stronger flare-ups. With discipline, patients may get used to it and adapt.   Moderate pain 3 Interferes significantly with activities of daily living (ADL). It becomes difficult to feed, bathe, get dressed, get on and off the toilet or to perform  personal hygiene functions. Difficult to get in and out of bed or a chair without assistance. Very distracting. With effort, it can be ignored when deeply involved in activities.   Moderately severe pain 4 Impossible to ignore for more than a few minutes. With effort, patients may still be able to manage work or participate in some social activities. Very difficult to concentrate. Signs of autonomic nervous system discharge are evident: dilated pupils (mydriasis); mild sweating (diaphoresis); sleep interference.  Heart rate becomes elevated (>115 bpm). Diastolic blood pressure (lower number) rises above 100 mmHg. Patients find relief in laying down and not moving.   Severe pain 5 Intense and extremely unpleasant. Associated with frowning face and frequent crying. Pain overwhelms the senses.  Ability to do any activity or maintain social relationships becomes significantly limited. Conversation becomes difficult. Pacing back and forth is common, as getting into a comfortable position is nearly impossible. Pain wakes you up from deep sleep. Physical signs will be obvious: pupillary dilation; increased sweating; goosebumps; brisk reflexes; cold, clammy hands and feet; nausea, vomiting or dry heaves; loss of appetite; significant sleep disturbance with inability to fall asleep or to remain asleep. When persistent, significant weight loss is observed due to the complete loss of appetite and sleep deprivation.  Blood pressure and heart rate becomes significantly elevated. Caution: If elevated blood pressure triggers a pounding headache associated with blurred vision, then the patient should immediately seek attention at an urgent or emergency care unit, as these may be signs of an impending stroke.    Emergency Department Pain Levels (6-10/10)  Emergency Room Pain 6 Severely limiting. Requires emergency care and should not be seen or managed at an outpatient pain management facility. Communication becomes difficult and requires great effort. Assistance to reach the emergency department may be required. Facial flushing and profuse sweating along with potentially dangerous increases in heart rate and blood pressure will be evident.   Distressing pain 7 Self-care is very difficult. Assistance is required to transport, or use restroom. Assistance to reach the emergency department will be required. Tasks requiring coordination, such as bathing and getting dressed become very difficult.   Disabling pain 8 Self-care is no  longer possible. At this level, pain is disabling. The individual is unable to do even the most "basic" activities such as walking, eating, bathing, dressing, transferring to a bed, or toileting. Fine motor skills are lost. It is difficult to think clearly.   Incapacitating pain 9 Pain becomes incapacitating. Thought processing is no longer possible. Difficult to remember your own name. Control of movement and coordination are lost.   The worst pain imaginable 10 At this level, most patients pass out from pain. When this level is reached, collapse of the autonomic nervous system occurs, leading to a sudden drop in blood pressure and heart rate. This in turn results in a temporary and dramatic drop in blood flow to the brain, leading to a loss of consciousness. Fainting is one of the body's self defense mechanisms. Passing out puts the brain in a calmed state and causes it to shut down for a while, in order to begin the healing process.    Summary: 1. Refer to this scale when providing [place] with your pain level. 2. Be accurate and careful when reporting your pain level. This will help with your care. 3. Over-reporting your pain level will lead to loss of credibility. 4. Even a level of 1/10 means that there is pain and will be treated at our facility.  5. High, inaccurate reporting will be documented as "Symptom Exaggeration", leading to loss of credibility and suspicions of possible secondary gains such as obtaining more narcotics, or wanting to appear disabled, for fraudulent reasons. 6. Only pain levels of 5 or below will be seen at our facility. 7. Pain levels of 6 and above will be sent to the Emergency Department and the appointment cancelled. _____________________________________________________________________________________________

## 2016-04-21 NOTE — Progress Notes (Addendum)
Nursing Pain Medication Assessment:  Safety precautions to be maintained throughout the outpatient stay will include: orient to surroundings, keep bed in low position, maintain call bell within reach at all times, provide assistance with transfer out of bed and ambulation.  Medication Inspection Compliance: Laura [name] did not comply with our request to bring her pills to be counted. She was reminded that bringing the medication bottles, even when empty, is a requirement. Pill/Patch Count: None available to be counted. Bottle Appearance: No container available. Did not bring bottle(s) to appointment. Medication: None brought in. Filled Date: N/A Last Medication intake:  Day before yesterday  Reminded to bring pills to all appoijntments.

## 2016-04-26 ENCOUNTER — Ambulatory Visit (INDEPENDENT_AMBULATORY_CARE_PROVIDER_SITE_OTHER): Payer: Medicare Other | Admitting: Psychiatry

## 2016-04-26 ENCOUNTER — Encounter: Payer: Self-pay | Admitting: Psychiatry

## 2016-04-26 VITALS — BP 158/84 | Temp 98.5°F | Wt 280.0 lb

## 2016-04-26 DIAGNOSIS — F3340 Major depressive disorder, recurrent, in remission, unspecified: Secondary | ICD-10-CM

## 2016-04-26 MED ORDER — QUETIAPINE FUMARATE 50 MG PO TABS
50.0000 mg | ORAL_TABLET | Freq: Two times a day (BID) | ORAL | 0 refills | Status: DC
Start: 2016-04-26 — End: 2016-07-27

## 2016-04-26 MED ORDER — DULOXETINE HCL 30 MG PO CPEP: ORAL_CAPSULE | ORAL | 2 refills | Status: DC

## 2016-04-26 MED ORDER — QUETIAPINE FUMARATE 200 MG PO TABS: 200.0000 mg | ORAL_TABLET | Freq: Every day | ORAL | 0 refills | Status: DC

## 2016-04-26 NOTE — Progress Notes (Signed)
Patient ID: Laura [name], female   DOB: [birth year], 71 y.o.   MRN: [id number]  [facility] MD/PA/NP OP Progress Note  04/26/2016 2:20 PM Galene Perdomo  MRN:  [id number]  Subjective:  Patient is a 71 year old Caucasian female with a long history of depression and insomnia.  Reports she is doing well moodwise. Compliant with her medications, denies any side effects. She has constipation, states it is chronic. Doing well, enjoying time with her family. Relationship improving with daughter-in-law.  States she is sleeping well and eating well.   Visit Diagnosis:     ICD-9-CM ICD-10-CM   1. Recurrent major depressive disorder, in remission (HCC) 296.35 F33.40     Past Medical History:  Past Medical History:  Diagnosis Date  . Allergy   . Anxiety   . Arthritis, degenerative 10/08/2013   Overview:    a.  Lumbar spine/spinal stenosis/foot drop.   b.  Hands.   . Chronic kidney disease   . Depression   . 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 Major depression, single episode, in complete remission (HCC) 06/25/2015  . Memory loss, short term 03/19/2014  . Seizure (HCC) 10/07/2014  . Sleep apnea   . Thyroid disease     Past Surgical History:  Procedure Laterality Date  . ABDOMINAL HYSTERECTOMY    . CESAREAN SECTION    . HIP SURGERY Right   . REPLACEMENT TOTAL KNEE Left    Family History:  Family History  Problem Relation Age of Onset  . Hypertension Mother   . Stroke Mother   . Heart attack Father   . Alcohol abuse Father   . Depression Father   . Post-traumatic stress disorder Father   . Rheum arthritis Sister   . Hypertension Sister   . Depression Sister    Social History:  Social History   Social History  . Marital status: Married    Spouse name: N/A  . Number of children: N/A  . Years of education: N/A   Social History Main Topics  . Smoking status: Never Smoker  . Smokeless tobacco: Never Used  . Alcohol use No  . Drug use: No  . Sexual  activity: Not Currently   Other Topics Concern  . None   Social History Narrative  . None   Additional History:   Assessment:   Musculoskeletal: Strength & Muscle Tone: within normal limits Gait & Station: normal Patient leans: N/A  Psychiatric Specialty Exam: Medication Refill     Review of Systems  Psychiatric/Behavioral: Negative for depression, hallucinations, memory loss, substance abuse and suicidal ideas. The patient is not nervous/anxious and does not have insomnia (Avis complaints have improved and she attributes it to  Seroquel).   All other systems reviewed and are negative.    Blood pressure (!) 158/84, temperature 98.5 F (36.9 C), temperature source Oral, weight 280 lb (127 kg).Body mass index is 46.59 kg/m.  General Appearance: Well Groomed  Eye Contact:  Good  Speech:  Normal Rate  Volume:  Normal  Mood:  Good  Affect:  Congruent  Thought Process:  Linear and Logical  Orientation:  Full (Time, Place, and Person)  Thought Content:  Negative  Suicidal Thoughts:  No  Homicidal Thoughts:  No  Memory:  Immediate;   Good Recent;   Good Remote;   Good  Judgement:  Good  Insight:  Good  Psychomotor Activity:  Negative  Concentration:  Good  Recall:  Good  Fund of Knowledge: Good  Language: Good  Akathisia:  Negative  Handed:    AIMS (if indicated):  N/A  Assets:  Communication Skills Desire for Improvement Social Support  ADL's:  Intact  Cognition: WNL  Sleep:  fair   Is the patient at risk to self?  No. Has the patient been a risk to self in the past 6 months?  No. Has the patient been a risk to self within the distant past?  No. Is the patient a risk to others?  No. Has the patient been a risk to others in the past 6 months?  No. Has the patient been a risk to others within the distant past?  No.  Current Medications: Current Outpatient Prescriptions  Medication Sig Dispense Refill  . amLODipine (NORVASC) 10 MG tablet Take 10 mg by mouth  2 (two) times daily.   11  . aspirin EC 81 MG tablet Take by mouth.    . chlorthalidone (HYGROTON) 25 MG tablet TAKE 1/2 TABLET (12.5MG ) BY MOUTH EVERY DAY  11  . Cholecalciferol (VITAMIN D) 2000 UNITS tablet Take by mouth daily.     Marland Kitchen docusate sodium (COLACE) 100 MG capsule Take 100 mg by mouth 2 (two) times daily.    . DULoxetine (CYMBALTA) 30 MG capsule Two tablets in the morning, and one in the evening. 90 capsule 2  . esomeprazole (NEXIUM) 20 MG capsule Take by mouth daily.     . folic acid (FOLVITE) 1 MG tablet Take 1 mg by mouth daily.    . furosemide (LASIX) 40 MG tablet TAKE 1 TABLET ONCE DAILY    . hydroxychloroquine (PLAQUENIL) 200 MG tablet Take 200 mg by mouth daily.     . InFLIXimab-dyyb (INFLECTRA) 100 MG SOLR Inject into the vein every 6 (six) weeks.     Marland Kitchen levothyroxine (SYNTHROID, LEVOTHROID) 200 MCG tablet TAKE 1 TABLET ONCE DAILY- ON AN EMPTY STOMACH WITH A GLASS OF WATER 30-60 MINUTES BEFORE BREAKFAST  5  . levothyroxine (SYNTHROID, LEVOTHROID) 25 MCG tablet TAKE 2 TABLET BY MOUTH EVERY DAY (TAKE WITH 20O MCG TABLET TO EQUAL A 225 MCG DOSE)  5  . loratadine (CLARITIN) 10 MG tablet Take by mouth daily as needed.     . lubiprostone (AMITIZA) 24 MCG capsule Take 1 capsule (24 mcg total) by mouth 2 (two) times daily with a meal. Swallow the medication whole. Do not break or chew the medication. 180 capsule 0  . methotrexate (RHEUMATREX) 2.5 MG tablet TAKE 4 TABLETS (10 MG TOTAL) BY MOUTH EVERY 7 (SEVEN) DAYS WITH A MEAL  5  . metoprolol succinate (TOPROL-XL) 50 MG 24 hr tablet TAKE 1 TABLET (50 MG TOTAL) BY MOUTH ONCE DAILY.  1  . Multiple Vitamin (MULTI-VITAMINS) TABS Take by mouth.    [name] ON 06/24/2016] oxyCODONE (OXY IR/ROXICODONE) 5 MG immediate release tablet Take 1 tablet (5 mg total) by mouth every 6 (six) hours as needed for severe pain. 120 tablet 0  . oxyCODONE (OXY IR/ROXICODONE) 5 MG immediate release tablet Take 1 tablet (5 mg total) by mouth every 6 (six) hours as  needed for severe pain. 120 tablet 0  . [START ON 05/25/2016] oxyCODONE (OXY IR/ROXICODONE) 5 MG immediate release tablet Take 1 tablet (5 mg total) by mouth every 6 (six) hours as needed for severe pain. 120 tablet 0  . QUEtiapine (SEROQUEL) 200 MG tablet Take 1 tablet (200 mg total) by mouth at bedtime. 90 tablet 0  . QUEtiapine (SEROQUEL) 50 MG tablet Take 1 tablet (50 mg total) by mouth 2 (  two) times daily. 180 tablet 0  . spironolactone (ALDACTONE) 25 MG tablet TAKE 1/2 (ONE-HALF) TABLET BY MOUTH DAILY  11  . traMADol (ULTRAM) 50 MG tablet TAKE ONE TABLET BY MOUTH EVERY EIGHT HOURS AS NEEDED FOR PAIN FOR UP TO FIVE DAYS.  3   No current facility-administered medications for this visit.     Medical Decision Making:  Established Problem, Stable/Improving (1), Review of Medication Regimen & Side Effects (2) and Review of New Medication or Change in Dosage (2)  Treatment Plan Summary:Medication management and Plan    Major depressive disorder, recurrent, moderate-patient will continue her Cymbalta 60 mg in the morning and 30 mg in the evening.   Insomnia- Continue Seroquel at 200 mg at bedtime.   Anxiety- take Seroquel at 50 mg twice daily. If she gets too sedated in the morning to shift change that to bedtime.  RTC to clinic in 3 months time or call before if necessary.  Arelia Volpe 04/26/2016, 2:20 PM

## 2016-07-11 ENCOUNTER — Ambulatory Visit: Payer: Medicare Other | Attending: Nurse Practitioner | Admitting: Nurse Practitioner

## 2016-07-11 ENCOUNTER — Encounter: Payer: Self-pay | Admitting: Nurse Practitioner

## 2016-07-11 VITALS — BP 109/93 | HR 82 | Temp 98.4°F | Resp 18 | Ht 65.0 in | Wt 279.0 lb

## 2016-07-11 DIAGNOSIS — Z7982 Long term (current) use of aspirin: Secondary | ICD-10-CM | POA: Insufficient documentation

## 2016-07-11 DIAGNOSIS — M47816 Spondylosis without myelopathy or radiculopathy, lumbar region: Secondary | ICD-10-CM

## 2016-07-11 DIAGNOSIS — K219 Gastro-esophageal reflux disease without esophagitis: Secondary | ICD-10-CM | POA: Insufficient documentation

## 2016-07-11 DIAGNOSIS — G4730 Sleep apnea, unspecified: Secondary | ICD-10-CM | POA: Diagnosis not present

## 2016-07-11 DIAGNOSIS — E7800 Pure hypercholesterolemia, unspecified: Secondary | ICD-10-CM | POA: Diagnosis not present

## 2016-07-11 DIAGNOSIS — Z96652 Presence of left artificial knee joint: Secondary | ICD-10-CM | POA: Diagnosis not present

## 2016-07-11 DIAGNOSIS — F411 Generalized anxiety disorder: Secondary | ICD-10-CM | POA: Diagnosis not present

## 2016-07-11 DIAGNOSIS — R0683 Snoring: Secondary | ICD-10-CM | POA: Insufficient documentation

## 2016-07-11 DIAGNOSIS — Z823 Family history of stroke: Secondary | ICD-10-CM | POA: Diagnosis not present

## 2016-07-11 DIAGNOSIS — Z90710 Acquired absence of both cervix and uterus: Secondary | ICD-10-CM | POA: Insufficient documentation

## 2016-07-11 DIAGNOSIS — R0609 Other forms of dyspnea: Secondary | ICD-10-CM | POA: Insufficient documentation

## 2016-07-11 DIAGNOSIS — N183 Chronic kidney disease, stage 3 (moderate): Secondary | ICD-10-CM | POA: Diagnosis not present

## 2016-07-11 DIAGNOSIS — Z6841 Body Mass Index (BMI) 40.0 and over, adult: Secondary | ICD-10-CM | POA: Diagnosis not present

## 2016-07-11 DIAGNOSIS — T402X5A Adverse effect of other opioids, initial encounter: Secondary | ICD-10-CM

## 2016-07-11 DIAGNOSIS — F329 Major depressive disorder, single episode, unspecified: Secondary | ICD-10-CM | POA: Diagnosis not present

## 2016-07-11 DIAGNOSIS — Z811 Family history of alcohol abuse and dependence: Secondary | ICD-10-CM | POA: Insufficient documentation

## 2016-07-11 DIAGNOSIS — Z880 Allergy status to penicillin: Secondary | ICD-10-CM | POA: Insufficient documentation

## 2016-07-11 DIAGNOSIS — M797 Fibromyalgia: Secondary | ICD-10-CM | POA: Diagnosis not present

## 2016-07-11 DIAGNOSIS — I129 Hypertensive chronic kidney disease with stage 1 through stage 4 chronic kidney disease, or unspecified chronic kidney disease: Secondary | ICD-10-CM | POA: Diagnosis not present

## 2016-07-11 DIAGNOSIS — E039 Hypothyroidism, unspecified: Secondary | ICD-10-CM | POA: Insufficient documentation

## 2016-07-11 DIAGNOSIS — R0600 Dyspnea, unspecified: Secondary | ICD-10-CM | POA: Insufficient documentation

## 2016-07-11 DIAGNOSIS — G894 Chronic pain syndrome: Secondary | ICD-10-CM

## 2016-07-11 DIAGNOSIS — M4696 Unspecified inflammatory spondylopathy, lumbar region: Secondary | ICD-10-CM | POA: Diagnosis not present

## 2016-07-11 DIAGNOSIS — K5903 Drug induced constipation: Secondary | ICD-10-CM

## 2016-07-11 DIAGNOSIS — Z8261 Family history of arthritis: Secondary | ICD-10-CM | POA: Diagnosis not present

## 2016-07-11 DIAGNOSIS — M1990 Unspecified osteoarthritis, unspecified site: Secondary | ICD-10-CM | POA: Insufficient documentation

## 2016-07-11 DIAGNOSIS — Z8249 Family history of ischemic heart disease and other diseases of the circulatory system: Secondary | ICD-10-CM | POA: Insufficient documentation

## 2016-07-11 DIAGNOSIS — Z79891 Long term (current) use of opiate analgesic: Secondary | ICD-10-CM

## 2016-07-11 DIAGNOSIS — N289 Disorder of kidney and ureter, unspecified: Secondary | ICD-10-CM | POA: Insufficient documentation

## 2016-07-11 DIAGNOSIS — Z818 Family history of other mental and behavioral disorders: Secondary | ICD-10-CM | POA: Insufficient documentation

## 2016-07-11 MED ORDER — OXYCODONE HCL 5 MG PO TABS: 5.0000 mg | ORAL_TABLET | Freq: Four times a day (QID) | ORAL | 0 refills | Status: DC | PRN

## 2016-07-11 MED ORDER — LUBIPROSTONE 24 MCG PO CAPS: 24.0000 ug | ORAL_CAPSULE | Freq: Two times a day (BID) | ORAL | 0 refills | Status: DC

## 2016-07-11 NOTE — Progress Notes (Signed)
Nursing Pain Medication Assessment:  Safety precautions to be maintained throughout the outpatient stay will include: orient to surroundings, keep bed in low position, maintain call bell within reach at all times, provide assistance with transfer out of bed and ambulation.  Medication Inspection Compliance: Pill count conducted under aseptic conditions, in front of the patient. Neither the pills nor the bottle was removed from the patient's sight at any time. Once count was completed pills were immediately returned to the patient in their original bottle.  Medication: Oxycodone IR Pill/Patch Count: 44 of 120 pills remain Pill/Patch Appearance: Markings consistent with prescribed medication Bottle Appearance: Standard pharmacy container. Clearly labeled. Filled Date: 06 / 15 / 2018 Last Medication intake:  Today

## 2016-07-11 NOTE — Progress Notes (Signed)
[name]  MRN: [id number]  Referring Provider: Glendon Axe, MD  DOB: December 31, 1945  PCP: Glendon Axe, MD  DOS: 07/11/2016  Note by: Vevelyn Francois NP  Service setting: Ambulatory outpatient  Specialty: Interventional Pain Management  Location: [facility] (AMB) Pain Management Facility    Patient type: Established    Primary Reason(s) for Visit: Encounter for prescription drug management. (Level of risk: moderate)  CC: Knee Pain (right) and Back Pain (low)  HPI  [name] is a 71 y.o. year old, female patient, who comes today for a medication management evaluation. She has Chronic kidney disease (CKD), stage III (moderate); Depressive disorder; Essential (primary) hypertension; GERD (gastroesophageal reflux disease); Adult hypothyroidism; Hypercholesterolemia; Extreme obesity ([place]); Insomnia secondary to chronic pain; Congestive heart failure with left ventricular systolic dysfunction ([place]); Type 2 diabetes mellitus ([place]); Morbid obesity ([place]); Seronegative rheumatoid arthritis ([place]); Joint pain; Opiate use (30 MME/Day); Long term current use of opiate analgesic; Long term prescription opiate use; Encounter for therapeutic drug level monitoring; Opioid dependence ([place]); Lumbar spinal stenosis (5 mm Severe L3-4; 8 mm L4-5); Lumbar spondylosis; Lumbar facet syndrome (Location of Primary Source of Pain) (Bilateral) (L>R); Chronic pain syndrome; Chronic low back pain (Location of Primary Source of Pain) (Bilateral) (L>R); History of TKR (total knee replacement) (Left); History of femur fracture (Right); 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 Her primarily concern today is the Knee Pain (right) and Back Pain (low)  Pain Assessment: Location: Right Knee Radiating: to calf Onset: More than a month ago Duration: Chronic pain Quality: Spasm Severity: 3 /10 (self-reported pain score)  Note: Reported level is compatible with observation.                   Effect on ADL:    Timing: Intermittent Modifying factors: rest, medications, ice  Laura [name] was last scheduled for an appointment on 04/21/16 for medication management. During today's appointment we reviewed Laura [name]'s chronic pain status, as well as her outpatient medication regimen. She has chronic low back pain. She has radicular symptoms that do down into her calf. She has numbness, tingling and weakness. She is having increased pain with activity. She admits that her daughter is getting married.   The patient  reports that she does not use drugs. Her body mass index is 46.43 kg/m.  Further details on both, my assessment(s), as well as the proposed treatment plan, please see below.  Controlled Substance Pharmacotherapy Assessment REMS (Risk Evaluation and Mitigation Strategy)  Analgesic:Oxycodone IR 5 mg every 6 hours (20 mg/day) MME/day:30 mg/day Hart Rochester, RN  07/11/2016  1:25 PM  Sign at close encounter Nursing Pain Medication Assessment:  Safety precautions to be maintained throughout the outpatient stay will include: orient to surroundings, keep bed in low position, maintain call bell within reach at all times, provide assistance with transfer out of bed and ambulation.  Medication Inspection Compliance: Pill count conducted under aseptic conditions, in front of the patient. Neither the pills nor the bottle was removed from the patient's sight at any time. Once  count was completed pills were immediately returned to  the patient in their original bottle.  Medication: Oxycodone IR Pill/Patch Count: 44 of 120 pills remain Pill/Patch Appearance: Markings consistent with prescribed medication Bottle Appearance: Standard pharmacy container. Clearly labeled. Filled Date: 06 / 15 / 2018 Last Medication intake:  Today   Pharmacokinetics: Liberation and absorption (onset of action): WNL Distribution (time to peak effect): WNL Metabolism and excretion (duration of action): WNL         Pharmacodynamics: Desired effects: Analgesia: Laura [name] reports >50% benefit. Functional ability: Patient reports that medication allows her to accomplish basic ADLs Clinically meaningful improvement in function (CMIF): Sustained CMIF goals met Perceived effectiveness: Described as relatively effective, allowing for increase in activities of daily living (ADL) Undesirable effects: Side-effects or Adverse reactions: None reported Monitoring: [place]: Online review of the past 75-monthperiod conducted. Compliant with practice rules and regulations List of all UDS test(s) done:  Lab Results  Component Value Date   TOXASSSELUR FINAL 07/15/2015   TOXASSSELUR FINAL 05/01/2015   TOXASSSELUR FINAL 11/05/2014   Last UDS on record: ToxAssure Select 13  Date Value Ref Range Status  07/15/2015 FINAL  Final    Comment:    ==================================================================== TOXASSURE SELECT 13 (MW) ==================================================================== Test                             Result       Flag       Units Drug Present and Declared for Prescription Verification   Oxycodone                      806          EXPECTED   ng/mg creat   Oxymorphone                    220          EXPECTED   ng/mg creat   Noroxycodone                   4112         EXPECTED   ng/mg creat   Noroxymorphone                 170          EXPECTED   ng/mg creat     Sources of oxycodone are scheduled prescription medications.    Oxymorphone, noroxycodone, and noroxymorphone are expected    metabolites of oxycodone. Oxymorphone is also available as a    scheduled prescription medication.   Tramadol                       PRESENT      EXPECTED   O-Desmethyltramadol            PRESENT      EXPECTED   N-Desmethyltramadol            PRESENT      EXPECTED    Source of tramadol is a prescription medication.    O-desmethyltramadol and N-desmethyltramadol are expected    metabolites of tramadol. ==================================================================== Test                      Result    Flag   Units      Ref Range   Creatinine              102  mg/dL      >=20 ==================================================================== Declared Medications:  The flagging and interpretation on this report are based on the  following declared medications.  Unexpected results may arise from  inaccuracies in the declared medications.  **Note: The testing scope of this panel includes these medications:  Oxycodone (Roxicodone)  Tramadol (Ultram)  **Note: The testing scope of this panel does not include following  reported medications:  Amlodipine (Norvasc)  Aspirin (Aspirin 81)  Cholecalciferol  Duloxetine (Cymbalta)  Furosemide (Lasix)  Hydroxychloroquine (Plaquenil)  Levothyroxine  Loratadine  Metoprolol  Omeprazole (Nexium)  Quetiapine (Seroquel)  Spironolactone (Aldactone) ==================================================================== For clinical consultation, please call [phone]. ====================================================================    UDS interpretation: Compliant          Medication Assessment Form: Reviewed. Patient indicates being compliant with therapy Treatment compliance: Compliant Risk Assessment Profile: Aberrant behavior: See prior evaluations. None observed or detected today Comorbid  factors increasing risk of overdose: See prior notes. No additional risks detected today Risk of substance use disorder (SUD): Low Opioid Risk Tool (ORT) Total Score:    Interpretation Table:  Score <3 = Low Risk for SUD  Score between 4-7 = Moderate Risk for SUD  Score >8 = High Risk for Opioid Abuse   Risk Mitigation Strategies:  Patient Counseling: Covered Patient-Prescriber Agreement (PPA): Present and active  Notification to other healthcare providers: Done  Pharmacologic Plan: No change in therapy, at this time  Laboratory Chemistry  Inflammation Markers (CRP: Acute Phase) (ESR: Chronic Phase) Lab Results  Component Value Date   CRP 1.9 (H) 07/15/2015   ESRSEDRATE 32 (H) 07/15/2015                 Renal Function Markers Lab Results  Component Value Date   BUN 21 (H) 07/15/2015   CREATININE 1.22 (H) 07/15/2015   GFRAA 51 (L) 07/15/2015   GFRNONAA 44 (L) 07/15/2015                 Hepatic Function Markers Lab Results  Component Value Date   AST 19 07/15/2015   ALT 15 07/15/2015   ALBUMIN 4.0 07/15/2015   ALKPHOS 73 07/15/2015                 Electrolytes Lab Results  Component Value Date   NA 140 07/15/2015   K 3.7 07/15/2015   CL 101 07/15/2015   CALCIUM 9.1 07/15/2015   MG 2.1 07/15/2015                 Neuropathy Markers Lab Results  Component Value Date   VITAMINB12 610 07/15/2015                 Bone Pathology Markers Lab Results  Component Value Date   ALKPHOS 73 07/15/2015   25OHVITD1 36 07/15/2015   25OHVITD2 3.6 07/15/2015   25OHVITD3 32 07/15/2015   CALCIUM 9.1 07/15/2015                 Coagulation Parameters Lab Results  Component Value Date   PLT 251 06/12/2013                 Cardiovascular Markers Lab Results  Component Value Date   HGB 13.6 06/12/2013   HCT 41.8 06/12/2013                 Note: Lab results reviewed.  Recent Diagnostic Imaging Review  Mm Screening Breast Tomo Bilateral  Result Date:  11/26/2015 CLINICAL DATA:  Screening. EXAM: 2D  DIGITAL SCREENING BILATERAL MAMMOGRAM WITH CAD AND ADJUNCT TOMO COMPARISON:  Previous exam(s). ACR Breast Density Category a: The breast tissue is almost entirely fatty. FINDINGS: There are no findings suspicious for malignancy. Images were processed with CAD. IMPRESSION: No mammographic evidence of malignancy. A result letter of this screening mammogram will be mailed directly to the patient. RECOMMENDATION: Screening mammogram in one year. (Code:SM-B-01Y) BI-RADS CATEGORY  1: Negative. Electronically Signed   By: Curlene Dolphin M.D.   On: 11/26/2015 15:50   Note: Imaging results reviewed.          Meds   Current Meds  Medication Sig  . amLODipine (NORVASC) 10 MG tablet Take 10 mg by mouth 2 (two) times daily.   Marland Kitchen aspirin EC 81 MG tablet Take by mouth.  . chlorthalidone (HYGROTON) 25 MG tablet TAKE 1/2 TABLET (12.'5MG'$ ) BY MOUTH EVERY DAY  . Cholecalciferol (VITAMIN D) 2000 UNITS tablet Take by mouth daily.   Marland Kitchen docusate sodium (COLACE) 100 MG capsule Take 100 mg by mouth 2 (two) times daily.  . DULoxetine (CYMBALTA) 30 MG capsule Two tablets in the morning, and one in the evening.  Marland Kitchen esomeprazole (NEXIUM) 20 MG capsule Take by mouth daily.   . folic acid (FOLVITE) 1 MG tablet Take 1 mg by mouth daily.  . furosemide (LASIX) 40 MG tablet TAKE 1 TABLET ONCE DAILY  . hydroxychloroquine (PLAQUENIL) 200 MG tablet Take 200 mg by mouth daily.   . InFLIXimab-dyyb (INFLECTRA) 100 MG SOLR Inject into the vein every 6 (six) weeks.   Marland Kitchen levothyroxine (SYNTHROID, LEVOTHROID) 200 MCG tablet TAKE 1 TABLET ONCE DAILY- ON AN EMPTY STOMACH WITH A GLASS OF WATER 30-60 MINUTES BEFORE BREAKFAST  . levothyroxine (SYNTHROID, LEVOTHROID) 25 MCG tablet TAKE 2 TABLET BY MOUTH EVERY DAY (TAKE WITH 20O MCG TABLET TO EQUAL A 225 MCG DOSE)  . loratadine (CLARITIN) 10 MG tablet Take by mouth daily as needed.   . methotrexate (RHEUMATREX) 2.5 MG tablet TAKE 4 TABLETS (10 MG TOTAL)  BY MOUTH EVERY 7 (SEVEN) DAYS WITH A MEAL  . metoprolol succinate (TOPROL-XL) 50 MG 24 hr tablet TAKE 1 TABLET (50 MG TOTAL) BY MOUTH ONCE DAILY.  . Multiple Vitamin (MULTI-VITAMINS) TABS Take by mouth.  . QUEtiapine (SEROQUEL) 200 MG tablet Take 1 tablet (200 mg total) by mouth at bedtime.  Marland Kitchen QUEtiapine (SEROQUEL) 50 MG tablet Take 1 tablet (50 mg total) by mouth 2 (two) times daily.  Marland Kitchen spironolactone (ALDACTONE) 25 MG tablet TAKE 1/2 (ONE-HALF) TABLET BY MOUTH DAILY  . [DISCONTINUED] lubiprostone (AMITIZA) 24 MCG capsule Take 1 capsule (24 mcg total) by mouth 2 (two) times daily with a meal. Swallow the medication whole. Do not break or chew the medication.  . [DISCONTINUED] oxyCODONE (OXY IR/ROXICODONE) 5 MG immediate release tablet Take 1 tablet (5 mg total) by mouth every 6 (six) hours as needed for severe pain.    ROS  Constitutional: Denies any fever or chills Gastrointestinal: No reported hemesis, hematochezia, vomiting, or acute GI distress Musculoskeletal: Denies any acute onset joint swelling, redness, loss of ROM, or weakness Neurological: No reported episodes of acute onset apraxia, aphasia, dysarthria, agnosia, amnesia, paralysis, loss of coordination, or loss of consciousness  Allergies  Ms. Westendorf is allergic to penicillins.  PFSH  Drug: Ms. Wiesen  reports that she does not use drugs. Alcohol:  reports that she does not drink alcohol. Tobacco:  reports that she has never smoked. She has never used smokeless tobacco. Medical:  has a past medical  history of Allergy; Anxiety; Arthritis, degenerative (10/08/2013); Chronic kidney disease; Depression; Lumbar spinal stenosis with neurogenic claudication (11/07/2014); Major depression, single episode, in complete remission ([place]) (06/25/2015); Memory loss, short term (03/19/2014); Seizure ([place]) (10/07/2014); Sleep apnea; and Thyroid disease. Surgical: Ms. Lawlor  has a past surgical history that includes Abdominal hysterectomy; Cesarean  section; Replacement total knee (Left); and Hip surgery (Right). Family: family history includes Alcohol abuse in her father; Depression in her father and sister; Heart attack in her father; Hypertension in her mother and sister; Post-traumatic stress disorder in her father; Rheum arthritis in her sister; Stroke in her mother.  Constitutional Exam  General appearance: morbidly obese In no apparent acute distress Vitals:   07/11/16 1326  BP: (!) 109/93  Pulse: 82  Resp: 18  Temp: 98.4 F (36.9 C)  TempSrc: Oral  SpO2: 99%  Weight: 279 lb (126.6 kg)  Height: '5\' 5"'$  (1.651 m)   BMI Assessment: Estimated body mass index is 46.43 kg/m as calculated from the following:   Height as of this encounter: '5\' 5"'$  (1.651 m).   Weight as of this encounter: 279 lb (126.6 kg).  BMI interpretation table: BMI level Category Range association with higher incidence of chronic pain  <18 kg/m2 Underweight   18.5-24.9 kg/m2 Ideal body weight   25-29.9 kg/m2 Overweight Increased incidence by 20%  30-34.9 kg/m2 Obese (Class I) Increased incidence by 68%  35-39.9 kg/m2 Severe obesity (Class II) Increased incidence by 136%  >40 kg/m2 Extreme obesity (Class III) Increased incidence by 254%   BMI Readings from Last 4 Encounters:  07/11/16 46.43 kg/m  04/21/16 45.26 kg/m  01/25/16 45.26 kg/m  10/15/15 45.26 kg/m   Wt Readings from Last 4 Encounters:  07/11/16 279 lb (126.6 kg)  04/21/16 272 lb (123.4 kg)  01/25/16 272 lb (123.4 kg)  10/15/15 272 lb (123.4 kg)  Psych/Mental status: Alert, oriented x 3 (person, place, & time)       Eyes: PERLA Respiratory: No evidence of acute respiratory distress  Cervical Spine Exam  Inspection: No masses, redness, or swelling Alignment: Symmetrical Functional ROM: Unrestricted ROM      Stability: No instability detected Muscle strength & Tone: Functionally intact Sensory: Unimpaired Palpation: No palpable anomalies              Upper Extremity (UE) Exam     Side: Right upper extremity  Side: Left upper extremity  Inspection: No masses, redness, swelling, or asymmetry. No contractures  Inspection: No masses, redness, swelling, or asymmetry. No contractures  Functional ROM: Unrestricted ROM          Functional ROM: Unrestricted ROM          Muscle strength & Tone: Functionally intact  Muscle strength & Tone: Functionally intact  Sensory: Unimpaired  Sensory: Unimpaired  Palpation: No palpable anomalies              Palpation: No palpable anomalies              Specialized Test(s): Deferred         Specialized Test(s): Deferred          Thoracic Spine Exam  Inspection: No masses, redness, or swelling Alignment: Symmetrical Functional ROM: Unrestricted ROM Stability: No instability detected Sensory: Unimpaired Muscle strength & Tone: No palpable anomalies  Lumbar Spine Exam  Inspection: No masses, redness, or swelling Alignment: Symmetrical Functional ROM: Unrestricted ROM      Stability: No instability detected Muscle strength & Tone: Functionally intact Sensory: Unimpaired Palpation:  No palpable anomalies       Provocative Tests: Lumbar Hyperextension and rotation test: evaluation deferred today       Patrick's Maneuver: evaluation deferred today                    Gait & Posture Assessment  Ambulation: Patient ambulates using a walker Gait: Relatively normal for age and body habitus Posture: WNL   Lower Extremity Exam    Side: Right lower extremity  Side: Left lower extremity  Inspection: Edema  Inspection: edema  Functional ROM: Unrestricted ROM          Functional ROM: Unrestricted ROM          Muscle strength & Tone: Functionally intact  Muscle strength & Tone: Functionally intact  Sensory: Unimpaired  Sensory: Unimpaired  Palpation: No palpable anomalies  Palpation: No palpable anomalies   Assessment  Primary Diagnosis & Pertinent Problem List: The primary encounter diagnosis was Lumbar spondylosis. Diagnoses of Chronic  pain syndrome, Opioid-induced constipation (OIC), Lumbar facet syndrome (Location of Primary Source of Pain) (Bilateral) (L>R), and Long term current use of opiate analgesic were also pertinent to this visit.  Status Diagnosis  Controlled Controlled Controlled 1. Lumbar spondylosis   2. Chronic pain syndrome   3. Opioid-induced constipation (OIC)   4. Lumbar facet syndrome (Location of Primary Source of Pain) (Bilateral) (L>R)   5. Long term current use of opiate analgesic     Problems updated and reviewed during this visit: Problem  Arthritis  Dyspnea On Exertion  Kidney Disease  Primary Fibromyalgia Syndrome  Snoring  Morbid Obesity With Bmi of 40.0-44.9, Adult ([facility])  Joint Pain  Depressive Disorder  Hypercholesterolemia   Plan of Care  Pharmacotherapy (Medications Ordered): Meds ordered this encounter  Medications  . oxyCODONE (OXY IR/ROXICODONE) 5 MG immediate release tablet    Sig: Take 1 tablet (5 mg total) by mouth every 6 (six) hours as needed for severe pain.    Dispense:  120 tablet    Refill:  0    Do not add this medication to the electronic "Automatic Refill" notification system. Patient may have prescription filled one day early if pharmacy is closed on scheduled refill date. Do not fill until: 07/24/16 To last until: 08/23/16    Order Specific Question:   Supervising Provider    Answer:   Milinda Pointer [phone]  . oxyCODONE (OXY IR/ROXICODONE) 5 MG immediate release tablet    Sig: Take 1 tablet (5 mg total) by mouth every 6 (six) hours as needed for severe pain.    Dispense:  120 tablet    Refill:  0    Do not add this medication to the electronic "Automatic Refill" notification system. Patient may have prescription filled one day early if pharmacy is closed on scheduled refill date.08/23/16  Do not fill until: 09/22/16    Order Specific Question:   Supervising Provider    Answer:   Milinda Pointer [phone]  . oxyCODONE (OXY IR/ROXICODONE) 5 MG  immediate release tablet    Sig: Take 1 tablet (5 mg total) by mouth every 6 (six) hours as needed for severe pain.    Dispense:  120 tablet    Refill:  0    Do not add this medication to the electronic "Automatic Refill" notification system. Patient may have prescription filled one day early if pharmacy is closed on scheduled refill date. Do not fill until: 09/22/16 To last until: 10/22/16    Order Specific Question:  Supervising Provider    Answer:   Milinda Pointer [phone]  . lubiprostone (AMITIZA) 24 MCG capsule    Sig: Take 1 capsule (24 mcg total) by mouth 2 (two) times daily with a meal. Swallow the medication whole. Do not break or chew the medication.    Dispense:  180 capsule    Refill:  0    Do not place this medication, or any other prescription from our practice, on "Automatic Refill". Patient may have prescription filled one day early if pharmacy is closed on scheduled refill date.    Order Specific Question:   Supervising Provider    Answer:   Milinda Pointer [[id number]]   New Prescriptions   No medications on file   Medications administered today: Ms. Koplin had no medications administered during this visit. Lab-work, procedure(s), and/or referral(s): Orders Placed This Encounter  Procedures  . ToxASSURE Select 13 (MW), Urine   Imaging and/or referral(s): None  Interventional therapies: Planned, scheduled, and/or pending:   Not at this time.   Considering:   Therapeutic series of 5 Hyalgan right knee injections  Diagnostic intra-articular left hip joint injection  Possible left hip radiofrequency ablation  Diagnostic right intra-articular knee joint injection with local anesthetic and steroid  Diagnostic bilateral genicular nerve block  Possible bilateral genicular nerve radiofrequency ablation  Diagnostic bilateral lumbar facet block  Possible bilateral lumbar facet radiofrequency ablation  Diagnostic bilateral L3-4 and/or L5-S1 transforaminal  epidural steroid injections  Diagnostic L3-4 versus L4-5 lumbar epidural steroid injection    Palliative PRN treatment(s):   Therapeutic series of 5 Hyalgan right knee injections Diagnostic intra-articular left hip joint injection  Diagnostic right intra-articular knee joint injection with local anesthetic and steroid  Diagnostic bilateral genicular nerve block  Diagnostic bilateral lumbar facet block  Diagnostic bilateral L3-4 and/or L5-S1 transforaminal epidural steroid injections  Diagnostic L3-4 versus L4-5 lumbar epidural steroid injection   Provider-requested follow-up: Return in about 3 months (around 10/11/2016) for MedMgmt.  Future Appointments Date Time Provider [place]  07/27/2016 2:30 PM Elvin So, MD [facility] None   Primary Care Physician: Glendon Axe, MD Location: [facility] Note by: Vevelyn Francois NP Date: 07/11/2016; Time: 2:29 PM  Pain Score Disclaimer: We use the NRS-11 scale. This is a self-reported, subjective measurement of pain severity with only modest accuracy. It is used primarily to identify changes within a particular patient. It must be understood that outpatient pain scales are significantly less accurate that those used for research, where they can be applied under ideal controlled circumstances with minimal exposure to variables. In reality, the score is likely to be a combination of pain intensity and pain affect, where pain affect describes the degree of emotional arousal or changes in action readiness caused by the sensory experience of pain. Factors such as social and work situation, setting, emotional state, anxiety levels, expectation, and prior pain experience may influence pain perception and show large inter-individual differences that may also be affected by time variables.  Patient instructions provided during this appointment: Patient Instructions    ____________________________________________________________________________________________  Medication Rules  Applies to: All patients receiving prescriptions (written or electronic).  Pharmacy of record: Pharmacy where electronic prescriptions will be sent. If written prescriptions are taken to a different pharmacy, please inform the nursing staff. The pharmacy listed in the electronic medical record should be the one where you would like electronic prescriptions to be sent.  Prescription refills: Only during scheduled appointments. Applies to both, written and electronic prescriptions.  NOTE: The following applies primarily to controlled substances (Opioid* Pain Medications).   Patient's responsibilities: 1. Pain Pills: Bring all pain pills to every appointment (except for procedure appointments). 2. Pill Bottles: Bring pills in original pharmacy bottle. Always bring newest bottle. Bring bottle, even if empty. 3. Medication refills: You are responsible for knowing and keeping track of what medications you need refilled. The day before your appointment, write a list of all prescriptions that need to be refilled. Bring that list to your appointment and give it to the admitting nurse. Prescriptions will be written only during appointments. If you forget a medication, it will not be "Called in", "Faxed", or "electronically sent". You will need to get another appointment to get these prescribed. 4. Prescription Accuracy: You are responsible for carefully inspecting your prescriptions before leaving our office. Have the discharge nurse carefully go over each prescription with you, before taking them home. Make sure that your name is accurately spelled, that your address is correct. Check the name and dose of your medication to make sure it is accurate. Check the number of pills, and the written instructions to make sure they are clear and accurate. Make sure that you are given enough medication to  last until your next medication refill appointment. 5. Taking Medication: Take medication as prescribed. Never take more pills than instructed. Never take medication more frequently than prescribed. Taking less pills or less frequently is permitted and encouraged, when it comes to controlled substances (written prescriptions).  6. Inform other Doctors: Always inform, all of your healthcare providers, of all the medications you take. 7. Pain Medication from other Providers: You are not allowed to accept any additional pain medication from any other Doctor or Healthcare provider. There are two exceptions to this rule. (see below) In the event that you require additional pain medication, you are responsible for notifying us, as stated below. 8. Medication Agreement: You are responsible for carefully reading and following our Medication Agreement. This must be signed before receiving any prescriptions from our practice. Safely store a copy of your signed Agreement. Violations to the Agreement will result in no further prescriptions. (Additional copies of our Medication Agreement are available upon request.) 9. Laws, Rules, & Regulations: All patients are expected to follow all [address] and [employer], Rules, [place] [employer]. Ignorance of the Laws does not constitute a valid excuse. The use of any illegal substances is prohibited. 10. Adopted CDC guidelines & recommendations: Target dosing levels will be at or below 60 MME/day. Use of benzodiazepines** is not recommended.  Exceptions: There are only two exceptions to the rule of not receiving pain medications from other Healthcare Providers. 1. Exception #1 (Emergencies): In the event of an emergency (i.e.: accident requiring emergency care), you are allowed to receive additional pain medication. However, you are responsible for: As soon as you are able, call our office [phone], at any time of the day or night, and leave a message stating your  name, the date and nature of the emergency, and the name and dose of the medication prescribed. In the event that your call is answered by a member of our staff, make sure to document and save the date, time, and the name of the person that took your information.  2. Exception #2 (Planned Surgery): In the event that you are scheduled by another doctor or dentist to have any type of surgery or procedure, you are allowed (for a period no longer than 30 days), to receive additional pain  medication, for the acute post-op pain. However, in this case, you are responsible for picking up a copy of our "Post-op Pain Management for Surgeons" handout, and giving it to your surgeon or dentist. This document is available at our office, and does not require an appointment to obtain it. Simply go to our office during business hours (Monday-Thursday from 8:00 AM to 4:00 PM) (Friday 8:00 AM to 12:00 Noon) or if you have a scheduled appointment with [place], prior to your surgery, and ask for it by name. In addition, you will need to provide [place] with your name, name of your surgeon, type of surgery, and date of procedure or surgery.  *Opioid medications include: morphine, codeine, oxycodone, oxymorphone, hydrocodone, hydromorphone, meperidine, tramadol, tapentadol, buprenorphine, fentanyl, methadone. **Benzodiazepine medications include: diazepam (Valium), alprazolam (Xanax), clonazepam (Klonopine), lorazepam (Ativan), clorazepate (Tranxene), chlordiazepoxide (Librium), estazolam (Prosom), oxazepam (Serax), temazepam (Restoril), triazolam (Halcion)  ____________________________________________________________________________________________

## 2016-07-11 NOTE — Patient Instructions (Signed)

## 2016-07-16 LAB — TOXASSURE SELECT 13 (MW), URINE

## 2016-07-25 ENCOUNTER — Ambulatory Visit: Payer: Medicare Other | Admitting: Psychiatry

## 2016-07-27 ENCOUNTER — Encounter: Payer: Self-pay | Admitting: Psychiatry

## 2016-07-27 ENCOUNTER — Ambulatory Visit (INDEPENDENT_AMBULATORY_CARE_PROVIDER_SITE_OTHER): Payer: Medicare Other | Admitting: Psychiatry

## 2016-07-27 VITALS — BP 139/81 | HR 101 | Temp 98.5°F | Wt 280.0 lb

## 2016-07-27 DIAGNOSIS — F3340 Major depressive disorder, recurrent, in remission, unspecified: Secondary | ICD-10-CM

## 2016-07-27 MED ORDER — QUETIAPINE FUMARATE 50 MG PO TABS: 50.0000 mg | ORAL_TABLET | Freq: Two times a day (BID) | ORAL | 0 refills | Status: DC

## 2016-07-27 MED ORDER — DULOXETINE HCL 30 MG PO CPEP: ORAL_CAPSULE | ORAL | 2 refills | Status: DC

## 2016-07-27 MED ORDER — QUETIAPINE FUMARATE 200 MG PO TABS: 200.0000 mg | ORAL_TABLET | Freq: Every day | ORAL | 0 refills | Status: DC

## 2016-07-27 NOTE — Progress Notes (Signed)
Patient ID: Laura [name], female   DOB: 1945-04-11, 71 y.o.   MRN: [id number]  [facility] MD/PA/NP OP Progress Note  07/27/2016 2:43 PM Jerzi Tigert  MRN:  [id number]  Subjective:  Patient is a 71 year old Caucasian female with a long history of depression and insomnia.  Reports she is doing well moodwise. Compliant with her medications, denies any side effects. Continues to do well. States that her daughter got married recently and the event went well. She has been compliant with her medication and denies any problems.   Visit Diagnosis:     ICD-10-CM   1. Recurrent major depressive disorder, in remission (HCC) F33.40     Past Medical History:  Past Medical History:  Diagnosis Date  . Allergy   . Anxiety   . Arthritis, degenerative 10/08/2013   Overview:    a.  Lumbar spine/spinal stenosis/foot drop.   b.  Hands.   . Chronic kidney disease   . Depression   . 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 Major depression, single episode, in complete remission (HCC) 06/25/2015  . Memory loss, short term 03/19/2014  . Seizure (HCC) 10/07/2014  . Sleep apnea   . Thyroid disease     Past Surgical History:  Procedure Laterality Date  . ABDOMINAL HYSTERECTOMY    . CESAREAN SECTION    . HIP SURGERY Right   . REPLACEMENT TOTAL KNEE Left    Family History:  Family History  Problem Relation Age of Onset  . Hypertension Mother   . Stroke Mother   . Heart attack Father   . Alcohol abuse Father   . Depression Father   . Post-traumatic stress disorder Father   . Rheum arthritis Sister   . Hypertension Sister   . Depression Sister    Social History:  Social History   Social History  . Marital status: Married    Spouse name: N/A  . Number of children: N/A  . Years of education: N/A   Social History Main Topics  . Smoking status: Never Smoker  . Smokeless tobacco: Never Used  . Alcohol use No  . Drug use: No  . Sexual activity: Not Currently   Other  Topics Concern  . None   Social History Narrative  . None   Additional History:   Assessment:   Musculoskeletal: Strength & Muscle Tone: within normal limits Gait & Station: normal Patient leans: N/A  Psychiatric Specialty Exam: Medication Refill     Review of Systems  Psychiatric/Behavioral: Negative for depression, hallucinations, memory loss, substance abuse and suicidal ideas. The patient is not nervous/anxious and does not have insomnia (Avis complaints have improved and she attributes it to  Seroquel).   All other systems reviewed and are negative.    Blood pressure 139/81, pulse (!) 101, temperature 98.5 F (36.9 C), temperature source Oral, weight 280 lb (127 kg).Body mass index is 46.59 kg/m.  General Appearance: Well Groomed  Eye Contact:  Good  Speech:  Normal Rate  Volume:  Normal  Mood:  Good  Affect:  Congruent  Thought Process:  Linear and Logical  Orientation:  Full (Time, Place, and Person)  Thought Content:  Negative  Suicidal Thoughts:  No  Homicidal Thoughts:  No  Memory:  Immediate;   Good Recent;   Good Remote;   Good  Judgement:  Good  Insight:  Good  Psychomotor Activity:  Negative  Concentration:  Good  Recall:  Good  Fund of Knowledge: Good  Language: Good  Akathisia:  Negative  Handed:    AIMS (if indicated):  N/A  Assets:  Communication Skills Desire for Improvement Social Support  ADL's:  Intact  Cognition: WNL  Sleep:  fair   Is the patient at risk to self?  No. Has the patient been a risk to self in the past 6 months?  No. Has the patient been a risk to self within the distant past?  No. Is the patient a risk to others?  No. Has the patient been a risk to others in the past 6 months?  No. Has the patient been a risk to others within the distant past?  No.  Current Medications: Current Outpatient Prescriptions  Medication Sig Dispense Refill  . amLODipine (NORVASC) 10 MG tablet Take 10 mg by mouth 2 (two) times daily.    11  . aspirin EC 81 MG tablet Take by mouth.    . chlorthalidone (HYGROTON) 25 MG tablet TAKE 1/2 TABLET (12.5MG ) BY MOUTH EVERY DAY  11  . Cholecalciferol (VITAMIN D) 2000 UNITS tablet Take by mouth daily.     Marland Kitchen docusate sodium (COLACE) 100 MG capsule Take 100 mg by mouth 2 (two) times daily.    . DULoxetine (CYMBALTA) 30 MG capsule Two tablets in the morning, and one in the evening. 90 capsule 2  . esomeprazole (NEXIUM) 20 MG capsule Take by mouth daily.     . folic acid (FOLVITE) 1 MG tablet Take 1 mg by mouth daily.    . furosemide (LASIX) 40 MG tablet TAKE 1 TABLET ONCE DAILY    . hydroxychloroquine (PLAQUENIL) 200 MG tablet Take 200 mg by mouth daily.     . InFLIXimab-dyyb (INFLECTRA) 100 MG SOLR Inject into the vein every 6 (six) weeks.     Marland Kitchen levothyroxine (SYNTHROID, LEVOTHROID) 200 MCG tablet TAKE 1 TABLET ONCE DAILY- ON AN EMPTY STOMACH WITH A GLASS OF WATER 30-60 MINUTES BEFORE BREAKFAST  5  . levothyroxine (SYNTHROID, LEVOTHROID) 25 MCG tablet TAKE 2 TABLET BY MOUTH EVERY DAY (TAKE WITH 20O MCG TABLET TO EQUAL A 225 MCG DOSE)  5  . loratadine (CLARITIN) 10 MG tablet Take by mouth daily as needed.     . lubiprostone (AMITIZA) 24 MCG capsule Take 1 capsule (24 mcg total) by mouth 2 (two) times daily with a meal. Swallow the medication whole. Do not break or chew the medication. 180 capsule 0  . methotrexate (RHEUMATREX) 2.5 MG tablet TAKE 4 TABLETS (10 MG TOTAL) BY MOUTH EVERY 7 (SEVEN) DAYS WITH A MEAL  5  . metoprolol succinate (TOPROL-XL) 50 MG 24 hr tablet TAKE 1 TABLET (50 MG TOTAL) BY MOUTH ONCE DAILY.  1  . Multiple Vitamin (MULTI-VITAMINS) TABS Take by mouth.    . oxyCODONE (OXY IR/ROXICODONE) 5 MG immediate release tablet Take 1 tablet (5 mg total) by mouth every 6 (six) hours as needed for severe pain. 120 tablet 0  . [START ON 08/23/2016] oxyCODONE (OXY IR/ROXICODONE) 5 MG immediate release tablet Take 1 tablet (5 mg total) by mouth every 6 (six) hours as needed for severe  pain. 120 tablet 0  . [START ON 09/22/2016] oxyCODONE (OXY IR/ROXICODONE) 5 MG immediate release tablet Take 1 tablet (5 mg total) by mouth every 6 (six) hours as needed for severe pain. 120 tablet 0  . QUEtiapine (SEROQUEL) 200 MG tablet Take 1 tablet (200 mg total) by mouth at bedtime. 90 tablet 0  . QUEtiapine (SEROQUEL) 50 MG tablet Take 1 tablet (50 mg total) by mouth 2 (  two) times daily. 180 tablet 0  . spironolactone (ALDACTONE) 25 MG tablet TAKE 1/2 (ONE-HALF) TABLET BY MOUTH DAILY  11  . traMADol (ULTRAM) 50 MG tablet TAKE ONE TABLET BY MOUTH EVERY EIGHT HOURS AS NEEDED FOR PAIN FOR UP TO FIVE DAYS.  3   No current facility-administered medications for this visit.     Medical Decision Making:  Established Problem, Stable/Improving (1), Review of Medication Regimen & Side Effects (2) and Review of New Medication or Change in Dosage (2)  Treatment Plan Summary:Medication management and Plan    Major depressive disorder, recurrent, moderate-patient will continue her Cymbalta 60 mg in the morning and 30 mg in the evening.   Insomnia- Continue Seroquel at 200 mg at bedtime.   Anxiety- take Seroquel at 50 mg twice daily. If she gets too sedated in the morning to shift change that to bedtime.  RTC to clinic in 3 months time or call before if necessary.  Sarra Rachels 07/27/2016, 2:43 PM

## 2016-08-15 ENCOUNTER — Telehealth: Payer: Self-pay

## 2016-08-15 ENCOUNTER — Telehealth: Payer: Self-pay | Admitting: *Deleted

## 2016-08-15 NOTE — Telephone Encounter (Signed)
pt called states she needs a refill on her seroquesl 200mg  and 50mg  .  pt last seen on  07-27-16  QUEtiapine (SEROQUEL) 50 MG tablet 180 tablet 0 07/27/2016 07/27/2017   Sig - Route: Take 1 tablet (50 mg total) by mouth 2 (two) times daily. - Oral   Sent to pharmacy as: QUEtiapine (SEROQUEL) 50 MG tablet   E-Prescribing Status: Receipt confirmed by pharmacy (07/27/2016 2:51 PM EDT)     Disp Refills Start End   QUEtiapine (SEROQUEL) 200 MG tablet 90 tablet 0 07/27/2016    Sig - Route: Take 1 tablet (200 mg total) by mouth at bedtime. - Oral   Sent to pharmacy as: QUEtiapine (SEROQUEL) 200 MG tablet   E-Prescribing Status: Receipt confirmed by pharmacy (07/27/2016 2:51 PM EDT)

## 2016-08-15 NOTE — Telephone Encounter (Signed)
Patient was prescribed 90 day supply of these meds on 07/27/2016

## 2016-08-15 NOTE — Telephone Encounter (Signed)
No prior auth reqd  for a knee injection.

## 2016-08-16 ENCOUNTER — Telehealth: Payer: Self-pay | Admitting: Pain Medicine

## 2016-08-16 NOTE — Telephone Encounter (Signed)
Having a lot of knee pain and wants to come for knee injections, would like to come [employer]

## 2016-08-18 ENCOUNTER — Ambulatory Visit
Admission: RE | Admit: 2016-08-18 | Discharge: 2016-08-18 | Disposition: A | Discharge status: Home / Self Care | Payer: Medicare Other | Source: Ambulatory Visit | Attending: Pain Medicine | Admitting: Pain Medicine

## 2016-08-18 ENCOUNTER — Ambulatory Visit (HOSPITAL_BASED_OUTPATIENT_CLINIC_OR_DEPARTMENT_OTHER): Payer: Medicare Other | Admitting: Pain Medicine

## 2016-08-18 ENCOUNTER — Encounter: Payer: Self-pay | Admitting: Pain Medicine

## 2016-08-18 DIAGNOSIS — G8929 Other chronic pain: Secondary | ICD-10-CM

## 2016-08-18 DIAGNOSIS — Z880 Allergy status to penicillin: Secondary | ICD-10-CM | POA: Diagnosis not present

## 2016-08-18 DIAGNOSIS — Z96652 Presence of left artificial knee joint: Secondary | ICD-10-CM | POA: Diagnosis not present

## 2016-08-18 DIAGNOSIS — M25551 Pain in right hip: Secondary | ICD-10-CM | POA: Insufficient documentation

## 2016-08-18 DIAGNOSIS — M1711 Unilateral primary osteoarthritis, right knee: Secondary | ICD-10-CM | POA: Insufficient documentation

## 2016-08-18 DIAGNOSIS — M25561 Pain in right knee: Secondary | ICD-10-CM | POA: Diagnosis not present

## 2016-08-18 DIAGNOSIS — IMO0001 Reserved for inherently not codable concepts without codable children: Secondary | ICD-10-CM | POA: Insufficient documentation

## 2016-08-18 MED ORDER — SODIUM HYALURONATE (VISCOSUP) 20 MG/2ML IX SOSY
2.0000 mL | PREFILLED_SYRINGE | Freq: Once | INTRA_ARTICULAR | Status: AC
  Administered 2016-08-18: 2 mL via INTRA_ARTICULAR
  Filled 2016-08-18: qty 2

## 2016-08-18 MED ORDER — ROPIVACAINE HCL 2 MG/ML IJ SOLN
2.0000 mL | Freq: Once | INTRAMUSCULAR | Status: AC
  Administered 2016-08-18: 10 mL via INTRA_ARTICULAR
  Filled 2016-08-18: qty 10

## 2016-08-18 MED ORDER — LIDOCAINE HCL (PF) 1 % IJ SOLN
5.0000 mL | Freq: Once | INTRAMUSCULAR | Status: AC
  Administered 2016-08-18: 5 mL
  Filled 2016-08-18: qty 5

## 2016-08-18 NOTE — Progress Notes (Signed)
[name]  MRN: [id number]  Referring Provider: Delano Metz, MD  DOB: February 18, 1945  PCP: Leotis Shames, MD  DOS: 08/18/2016  Note by: Oswaldo Done, MD  Service setting: Ambulatory outpatient  Specialty: Interventional Pain Management  Patient type: Established  Location: [facility] (AMB) Pain Management Facility  Visit type: Interventional Procedure   Primary Reason for Visit: Interventional Pain Management Treatment. CC: Knee Pain (right)  Procedure:  Anesthesia, Analgesia, Anxiolysis:  Type: Therapeutic Intra-Articular Hyalgan Knee Injection #1 Region: Lateral infrapatellar Knee Region Level: Knee Joint Laterality: Right knee  Type: Local Anesthesia Local Anesthetic: Lidocaine 1% Route: Infiltration ([place]/IM) IV Access: Declined Sedation: Declined  Indication(s): Analgesia          Indications: 1. Chronic knee pain (Right)   2. Primary osteoarthritis of right knee   3. 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 Goda is a 71 y.o. (year old), female patient, seen today for interventional treatment. She  has a past surgical history that includes Abdominal hysterectomy; Cesarean section; Replacement total knee (Left); and Hip surgery (Right). Ms. Hautala has a current medication list which includes the following prescription(s): amlodipine, aspirin ec, chlorthalidone, chlorthalidone, vitamin d, docusate sodium, duloxetine, esomeprazole, folic acid, furosemide, hydroxychloroquine, infliximab-dyyb, levothyroxine, levothyroxine, loratadine, lubiprostone, methotrexate, metoprolol succinate, multi-vitamins, oxycodone, oxycodone, oxycodone, quetiapine, quetiapine, spironolactone, and tramadol. Her primarily concern today is the Knee Pain (right)  Initial Vital Signs: Blood pressure (!) 134/59, pulse 82, temperature 98 F (36.7 C), resp. rate 16, height 5\' 5"  (1.651 m),  weight 270 lb (122.5 kg), SpO2 98 %. BMI: Estimated body mass index is 44.93 kg/m as calculated from the following:   Height as of this encounter: 5\' 5"  (1.651 m).   Weight as of this encounter: 270 lb (122.5 kg).  Risk Assessment: Allergies: Reviewed. She is allergic to bupropion and penicillins.  Allergy Precautions: None required Coagulopathies: Reviewed. None identified.  Blood-thinner therapy: None at this time Active Infection(s): Reviewed. None identified. Ms. Krauss is afebrile  Site Confirmation: Ms. Currie was asked to confirm the procedure and laterality before marking the site Procedure checklist: Completed Consent: Before the procedure and under the influence of no sedative(s), amnesic(s), or anxiolytics, the patient was informed of the treatment options, risks and possible complications. To fulfill our ethical and legal obligations, as recommended by the American Medical Association's Code of Ethics, I have informed the patient of my clinical impression; the nature and purpose of the treatment or procedure; the risks, benefits, and possible complications of the intervention; the alternatives, including doing nothing; the risk(s) and benefit(s) of the alternative treatment(s) or procedure(s); and the risk(s) and benefit(s) of doing nothing. The patient was provided information about the general risks and possible complications associated with the procedure. These may include, but are not limited to: failure to achieve desired goals, infection, bleeding, organ or nerve damage, allergic reactions, paralysis, and death. In addition, the patient was informed of those risks and complications associated to the procedure, such as failure to decrease pain; infection; bleeding; organ or nerve damage with subsequent damage to sensory, motor, and/or autonomic systems, resulting in permanent pain, numbness, and/or weakness of one or several areas of the body; allergic reactions; (i.e.:  anaphylactic reaction); and/or death. Furthermore, the patient was informed of those risks and complications associated with the medications. These include, but are not limited to: allergic reactions (i.e.: anaphylactic or anaphylactoid reaction(s)); adrenal axis suppression; blood  sugar elevation that in diabetics may result in ketoacidosis or comma; water retention that in patients with history of congestive heart failure may result in shortness of breath, pulmonary edema, and decompensation with resultant heart failure; weight gain; swelling or edema; medication-induced neural toxicity; particulate matter embolism and blood vessel occlusion with resultant organ, and/or nervous system infarction; and/or aseptic necrosis of one or more joints. Finally, the patient was informed that Medicine is not an exact science; therefore, there is also the possibility of unforeseen or unpredictable risks and/or possible complications that may result in a catastrophic outcome. The patient indicated having understood very clearly. We have given the patient no guarantees and we have made no promises. Enough time was given to the patient to ask questions, all of which were answered to the patient's satisfaction. Ms. Supple has indicated that she wanted to continue with the procedure. Attestation: I, the ordering provider, attest that I have discussed with the patient the benefits, risks, side-effects, alternatives, likelihood of achieving goals, and potential problems during recovery for the procedure that I have provided informed consent. Date: 08/18/2016; Time: 11:59 AM  Pre-Procedure Preparation:  Monitoring: As per clinic protocol. Respiration, ETCO2, SpO2, BP, heart rate and rhythm monitor placed and checked for adequate function Safety Precautions: Patient was assessed for positional comfort and pressure points before starting the procedure. Time-out: I initiated and conducted the "Time-out" before starting the  procedure, as per protocol. The patient was asked to participate by confirming the accuracy of the "Time Out" information. Verification of the correct person, site, and procedure were performed and confirmed by me, the nursing staff, and the patient. "Time-out" conducted as per Joint Commission's Universal Protocol (UP.01.01.01). "Time-out" Date & Time: 08/18/2016; 1219 hrs.  Description of Procedure Process:   Position: Sitting Target Area: Knee Joint Approach: Just above the Lateral tibial plateau, lateral to the infrapatellar tendon. Area Prepped: Entire knee area, from the mid-thigh to the mid-shin. Prepping solution: ChloraPrep (2% chlorhexidine gluconate and 70% isopropyl alcohol) Safety Precautions: Aspiration looking for blood return was conducted prior to all injections. At no point did we inject any substances, as a needle was being advanced. No attempts were made at seeking any paresthesias. Safe injection practices and needle disposal techniques used. Medications properly checked for expiration dates. SDV (single dose vial) medications used. Description of the Procedure: Protocol guidelines were followed. The patient was placed in position over the fluoroscopy table. The target area was identified and the area prepped in the usual manner. Skin desensitized using vapocoolant spray. Skin & deeper tissues infiltrated with local anesthetic. Appropriate amount of time allowed to pass for local anesthetics to take effect. The procedure needles were then advanced to the target area. Proper needle placement secured. Negative aspiration confirmed. Solution injected in intermittent fashion, asking for systemic symptoms every 0.5cc of injectate. The needles were then removed and the area cleansed, making sure to leave some of the prepping solution back to take advantage of its long term bactericidal properties. Vitals:   08/18/16 1108 08/18/16 1219 08/18/16 1224 08/18/16 1228  BP: (!) 134/59 128/80  127/80 (!) 120/91  Pulse: 82 76 73 72  Resp: 16 16 16 16   Temp: 98 F (36.7 C)     SpO2: 98% 97% 100% 100%  Weight: 270 lb (122.5 kg)     Height: 5\' 5"  (1.651 m)       Start Time: 1219 hrs. End Time: 1224 hrs. Materials:  Needle(s) Type: Regular needle Gauge: 22G Length: 3.5-in Medication(s): We  administered Sodium Hyaluronate, lidocaine (PF), and ropivacaine (PF) 2 mg/mL (0.2%). Please see chart orders for dosing details.  Imaging Guidance:  Type of Imaging Technique: Fluoroscopy Guidance (Non-spinal) Indication(s): Assistance in needle guidance and placement for procedures requiring needle placement in or near specific anatomical locations not easily accessible without such assistance. Exposure Time: Please see nurses notes. Contrast: None used. Fluoroscopic Guidance: I was personally present during the use of fluoroscopy. "Tunnel Vision Technique" used to obtain the best possible view of the target area. Parallax error corrected before commencing the procedure. "Direction-depth-direction" technique used to introduce the needle under continuous pulsed fluoroscopy. Once target was reached, antero-posterior, oblique, and lateral fluoroscopic projection used confirm needle placement in all planes. Images permanently stored in EMR. Ultrasound Guidance: N/A Interpretation: No contrast injected. I personally interpreted the imaging intraoperatively. Adequate needle placement confirmed in multiple planes. Permanent images saved into the patient's record.  Antibiotic Prophylaxis:  Indication(s): None identified Antibiotic given: None  Post-operative Assessment:  EBL: None Complications: No immediate post-treatment complications observed by team, or reported by patient. Note: The patient tolerated the entire procedure well. A repeat set of vitals were taken after the procedure and the patient was kept under observation following institutional policy, for this type of procedure.  Post-procedural neurological assessment was performed, showing return to baseline, prior to discharge. The patient was provided with post-procedure discharge instructions, including a section on how to identify potential problems. Should any problems arise concerning this procedure, the patient was given instructions to immediately contact us, at any time, without hesitation. In any case, we plan to contact the patient by telephone for a follow-up status report regarding this interventional procedure. Comments:  No additional relevant information.  Plan of Care  Disposition: Discharge home  Discharge Date & Time: 08/18/2016; 1231 hrs.  Physician-requested Follow-up:  Return in about 2 weeks (around 09/01/2016) for Procedure (no sedation): (R) IA Hyalgan No. 2.  New Prescriptions   No medications on file   Future Appointments Date Time Provider Department Center  09/01/2016 11:15 AM Delano Metz, MD [facility] None  10/10/2016 2:30 PM Barbette Merino, NP [facility] None  11/15/2016 2:00 PM Patrick North, MD [facility] None    Imaging Orders     DG C-Arm 1-60 Min-No Report  Procedure Orders     KNEE INJECTION  Medications ordered for procedure: Meds ordered this encounter  Medications  . Sodium Hyaluronate SOSY 2 mL  . lidocaine (PF) (XYLOCAINE) 1 % injection 5 mL  . ropivacaine (PF) 2 mg/mL (0.2%) (NAROPIN) injection 2 mL   Medications administered: We administered Sodium Hyaluronate, lidocaine (PF), and ropivacaine (PF) 2 mg/mL (0.2%).  See the medical record for exact dosing, route, and time of administration.  Primary Care Physician: Leotis Shames, MD Location: [facility] Out[facility] Management Facility Note by: Oswaldo Done, MD Date: 08/18/2016; Time: 12:44 PM  Disclaimer:  Medicine is not an Visual merchandiser. The only guarantee in medicine is that nothing is guaranteed. It is important to note that the decision to proceed with this intervention was based on the  information collected from the patient. The Data and conclusions were drawn from the patient's questionnaire, the interview, and the physical examination. Because the information was provided in large part by the patient, it cannot be guaranteed that it has not been purposely or unconsciously manipulated. Every effort has been made to obtain as much relevant data as possible for this evaluation. It is important to note that the conclusions that lead to this procedure  are derived in large part from the available data. Always take into account that the treatment will also be dependent on availability of resources and existing treatment guidelines, considered by other Pain Management Practitioners as being common knowledge and practice, at the time of the intervention. For Medico-Legal purposes, it is also important to point out that variation in procedural techniques and pharmacological choices are the acceptable norm. The indications, contraindications, technique, and results of the above procedure should only be interpreted and judged by a Board-Certified Interventional Pain Specialist with extensive familiarity and expertise in the same exact procedure and technique.

## 2016-08-18 NOTE — Progress Notes (Signed)
Safety precautions to be maintained throughout the outpatient stay will include: orient to surroundings, keep bed in low position, maintain call bell within reach at all times, provide assistance with transfer out of bed and ambulation.  

## 2016-08-18 NOTE — Patient Instructions (Addendum)
____________________________________________________________________________________________  Post-Procedure instructions Instructions:  Apply ice: Fill a plastic sandwich bag with crushed ice. Cover it with a small towel and apply to injection site. Apply for 15 minutes then remove x 15 minutes. Repeat sequence on day of procedure, until you go to bed. The purpose is to minimize swelling and discomfort after procedure.  Apply heat: Apply heat to procedure site starting the day following the procedure. The purpose is to treat any soreness and discomfort from the procedure.  Food intake: Start with clear liquids (like water) and advance to regular food, as tolerated.   Physical activities: Keep activities to a minimum for the first 8 hours after the procedure.   Driving: If you have received any sedation, you are not allowed to drive for 24 hours after your procedure.  Blood thinner: Restart your blood thinner 6 hours after your procedure. (Only for those taking blood thinners)  Insulin: As soon as you can eat, you may resume your normal dosing schedule. (Only for those taking insulin)  Infection prevention: Keep procedure site clean and dry.  Post-procedure Pain Diary: Extremely important that this be done correctly and accurately. Recorded information will be used to determine the next step in treatment.  Pain evaluated is that of treated area only. Do not include pain from an untreated area.  Complete every hour, on the hour, for the initial 8 hours. Set an alarm to help you do this part accurately.  Do not go to sleep and have it completed later. It will not be accurate.  Follow-up appointment: Keep your follow-up appointment after the procedure. Usually 2 weeks for most procedures. (6 weeks in the case of radiofrequency.) Bring you pain diary.  Expect:  From numbing medicine (AKA: Local Anesthetics): Numbness or decrease in pain.  Onset: Full effect within 15 minutes of  injected.  Duration: It will depend on the type of local anesthetic used. On the average, 1 to 8 hours.   From steroids: Decrease in swelling or inflammation. Once inflammation is improved, relief of the pain will follow.  Onset of benefits: Depends on the amount of swelling present. The more swelling, the longer it will take for the benefits to be seen. In some cases, up to 10 days.  Duration: Steroids will stay in the system x 2 weeks. Duration of benefits will depend on multiple posibilities including persistent irritating factors.  From procedure: Some discomfort is to be expected once the numbing medicine wears off. This should be minimal if ice and heat are applied as instructed. Call if:  You experience numbness and weakness that gets worse with time, as opposed to wearing off.  New onset bowel or bladder incontinence. (Spinal procedures only)  Emergency Numbers:  Durning business hours (Monday - Thursday, 8:00 AM - 4:00 PM) (Friday, 9:00 AM - 12:00 Noon): [phone]  After hours: [phone] ____________________________________________________________________________________________  ____________________________________________________________________________________________  Preparing for your procedure (without sedation) Instructions: . Oral Intake: Do not eat or drink anything for at least 3 hours prior to your procedure. . Transportation: Unless otherwise stated by your physician, you may drive yourself after the procedure. . Blood Pressure Medicine: Take your blood pressure medicine with a sip of water the morning of the procedure. . Blood thinners:  . Diabetics on insulin: Notify the staff so that you can be scheduled 1st case in the morning. If your diabetes requires high dose insulin, take only  of your normal insulin dose the morning of the procedure and notify the staff   that you have done so. . Preventing infections: Shower with an antibacterial soap the  morning of your procedure.  . Build-up your immune system: Take 1000 mg of Vitamin C with every meal (3 times a day) the day prior to your procedure. [place]land Kitchen Antibiotics: Inform the staff if you have a condition or reason that requires you to take antibiotics before dental procedures. . Pregnancy: If you are pregnant, call and cancel the procedure. . Sickness: If you have a cold, fever, or any active infections, call and cancel the procedure. . Arrival: You must be in the facility at least 30 minutes prior to your scheduled procedure. . Children: Do not bring any children with you. . Dress appropriately: Bring dark clothing that you would not mind if they get stained. . Valuables: Do not bring any jewelry or valuables. Procedure appointments are reserved for interventional treatments only. Mar[place] Kitchen No Prescription Refills. . No medication changes will be discussed during procedure appointments. . No disability issues will be discussed. ____________________________________________________________________________________________  Pain Management Discharge Instructions  General Discharge Instructions :  If you need to reach your doctor call: Monday-Friday 8:00 am - 4:00 pm at [phone] or toll free [phone].  After clinic hours [phone] to have operator reach doctor.  Bring all of your medication bottles to all your appointments in the pain clinic.  To cancel or reschedule your appointment with Pain Management please remember to call 24 hours in advance to avoid a fee.  Refer to the educational materials which you have been given on: General Risks, I had my Procedure. Discharge Instructions, Post Sedation.  Post Procedure Instructions:  The drugs you were given will stay in your system until tomorrow, so for the next 24 hours you should not drive, make any legal decisions or drink any alcoholic beverages.  You may eat anything you prefer, but it is better to start with liquids then soups  and crackers, and gradually work up to solid foods.  Please notify your doctor immediately if you have any unusual bleeding, trouble breathing or pain that is not related to your normal pain.  Depending on the type of procedure that was done, some parts of your body may feel week and/or numb.  This usually clears up by tonight or the next day.  Walk with the use of an assistive device or accompanied by an adult for the 24 hours.  You may use ice on the affected area for the first 24 hours.  Put ice in a Ziploc bag and cover with a towel and place against area 15 minutes on 15 minutes off.  You may switch to heat after 24 hours. Knee Injection A knee injection is a procedure to get medicine into your knee joint. Your health care provider puts a needle into the joint and injects medicine with an attached syringe. The injected medicine may relieve the pain, swelling, and stiffness of arthritis. The injected medicine may also help to lubricate and cushion your knee joint. You may need more than one injection. Tell a health care provider about: Any allergies you have. All medicines you are taking, including vitamins, herbs, eye drops, creams, and over-the-counter medicines. Any problems you or family members have had with anesthetic medicines. Any blood disorders you have. Any surgeries you have had. Any medical conditions you have. What are the risks? Generally, this is a safe procedure. However, problems may occur, including: Infection. Bleeding. Worsening symptoms. Damage to the area around your knee. Allergic reaction to any of the  medicines. Skin reactions from repeated injections.  What happens before the procedure? Ask your health care provider about changing or stopping your regular medicines. This is especially important if you are taking diabetes medicines or blood thinners. Plan to have someone take you home after the procedure. What happens during the procedure? You will sit or  lie down in a position for your knee to be treated. The skin over your kneecap will be cleaned with a germ-killing solution (antiseptic). You will be given a medicine that numbs the area (local anesthetic). You may feel some stinging. After your knee becomes numb, you will have a second injection. This is the medicine. This needle is carefully placed between your kneecap and your knee. The medicine is injected into the joint space. At the end of the procedure, the needle will be removed. A bandage (dressing) may be placed over the injection site. The procedure may vary among health care providers and hospitals. What happens after the procedure? You may have to move your knee through its full range of motion. This helps to get all of the medicine into your joint space. Your blood pressure, heart rate, breathing rate, and blood oxygen level will be monitored often until the medicines you were given have worn off. You will be watched to make sure that you do not have a reaction to the injected medicine. This information is not intended to replace advice given to you by your health care provider. Make sure you discuss any questions you have with your health care provider. Document Released: 03/20/2006 Document Revised: 05/29/2015 Document Reviewed: 11/06/2013 Elsevier Interactive Patient Education  2018 [employer].

## 2016-08-19 ENCOUNTER — Telehealth: Payer: Self-pay

## 2016-08-19 NOTE — Telephone Encounter (Signed)
Post procedure phone call.  Left message.  

## 2016-09-01 ENCOUNTER — Encounter: Payer: Self-pay | Admitting: Pain Medicine

## 2016-09-01 ENCOUNTER — Ambulatory Visit
Admission: RE | Admit: 2016-09-01 | Discharge: 2016-09-01 | Disposition: A | Discharge status: Home / Self Care | Payer: Medicare Other | Source: Ambulatory Visit | Attending: Pain Medicine | Admitting: Pain Medicine

## 2016-09-01 ENCOUNTER — Ambulatory Visit: Payer: Medicare Other | Admitting: Pain Medicine

## 2016-09-01 VITALS — BP 119/68 | HR 92 | Temp 98.4°F | Resp 21 | Ht 65.0 in | Wt 275.0 lb

## 2016-09-01 DIAGNOSIS — IMO0001 Reserved for inherently not codable concepts without codable children: Secondary | ICD-10-CM

## 2016-09-01 DIAGNOSIS — G8929 Other chronic pain: Secondary | ICD-10-CM | POA: Diagnosis not present

## 2016-09-01 DIAGNOSIS — M25561 Pain in right knee: Secondary | ICD-10-CM

## 2016-09-01 DIAGNOSIS — M1711 Unilateral primary osteoarthritis, right knee: Secondary | ICD-10-CM | POA: Diagnosis present

## 2016-09-01 DIAGNOSIS — M25551 Pain in right hip: Secondary | ICD-10-CM | POA: Insufficient documentation

## 2016-09-01 DIAGNOSIS — Z880 Allergy status to penicillin: Secondary | ICD-10-CM | POA: Insufficient documentation

## 2016-09-01 DIAGNOSIS — M170 Bilateral primary osteoarthritis of knee: Secondary | ICD-10-CM | POA: Insufficient documentation

## 2016-09-01 DIAGNOSIS — Z96652 Presence of left artificial knee joint: Secondary | ICD-10-CM | POA: Diagnosis not present

## 2016-09-01 MED ORDER — ROPIVACAINE HCL 2 MG/ML IJ SOLN
2.0000 mL | Freq: Once | INTRAMUSCULAR | Status: AC
  Administered 2016-09-01: 10 mL via INTRA_ARTICULAR

## 2016-09-01 MED ORDER — LIDOCAINE HCL (PF) 1 % IJ SOLN
5.0000 mL | Freq: Once | INTRAMUSCULAR | Status: AC
  Administered 2016-09-01: 5 mL

## 2016-09-01 MED ORDER — ROPIVACAINE HCL 2 MG/ML IJ SOLN
INTRAMUSCULAR | Status: AC
  Filled 2016-09-01: qty 10

## 2016-09-01 MED ORDER — LIDOCAINE HCL 2 % IJ SOLN
INTRAMUSCULAR | Status: AC
  Filled 2016-09-01: qty 20

## 2016-09-01 MED ORDER — SODIUM HYALURONATE (VISCOSUP) 20 MG/2ML IX SOSY
2.0000 mL | PREFILLED_SYRINGE | Freq: Once | INTRA_ARTICULAR | Status: AC
  Administered 2016-09-01: 2 mL via INTRA_ARTICULAR
  Filled 2016-09-01: qty 2

## 2016-09-01 MED ORDER — LIDOCAINE HCL (PF) 1 % IJ SOLN
INTRAMUSCULAR | Status: AC
  Filled 2016-09-01: qty 5

## 2016-09-01 NOTE — Progress Notes (Signed)
[name]  MRN: [id number]  Referring Provider: Leotis Shames, MD  DOB: 01/10/1946  PCP: Leotis Shames, MD  DOS: 09/01/2016  Note by: Oswaldo Done, MD  Service setting: Ambulatory outpatient  Specialty: Interventional Pain Management  Patient type: Established  Location: [facility] (AMB) Pain Management Facility  Visit type: Interventional Procedure   Primary Reason for Visit: Interventional Pain Management Treatment. CC: Knee Pain (right)  Procedure:  Anesthesia, Analgesia, Anxiolysis:  Type: Therapeutic Intra-Articular Hyalgan Knee Injection #2 Region: Medial infrapatellar Knee Region Level: Knee Joint Laterality: Right knee  Type: Local Anesthesia Local Anesthetic: Lidocaine 1% Route: Infiltration ([place]/IM) IV Access: Declined Sedation: Declined  Indication(s): Analgesia          Indications: 1. Osteoarthritis of knee (Right)   2. Chronic knee pain (Right)   3. 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 Primary osteoarthritis of both knees    Pain Score: Pre-procedure: 3 /10 Post-procedure: 2 /10  Pre-op Assessment:  Laura [name] is a 71 y.o. (year old), female patient, seen today for interventional treatment. She  has a past surgical history that includes Abdominal hysterectomy; Cesarean section; Replacement total knee (Left); and Hip surgery (Right). Laura [name] has a current medication list which includes the following prescription(s): amlodipine, aspirin ec, chlorthalidone, vitamin d, docusate sodium, duloxetine, esomeprazole, folic acid, furosemide, hydroxychloroquine, infliximab-dyyb, levothyroxine, levothyroxine, loratadine, lubiprostone, methotrexate, metoprolol succinate, multi-vitamins, oxycodone, oxycodone, quetiapine, quetiapine, spironolactone, tramadol, and oxycodone. Her primarily concern today is the Knee Pain (right)  Initial Vital Signs: Blood pressure 124/83, pulse 99, temperature 98.4 F (36.9 C), resp. rate 16, height 5\' 5"  (1.651  m), weight 275 lb (124.7 kg), SpO2 97 %. BMI: Estimated body mass index is 45.76 kg/m as calculated from the following:   Height as of this encounter: 5\' 5"  (1.651 m).   Weight as of this encounter: 275 lb (124.7 kg).  Risk Assessment: Allergies: Reviewed. She is allergic to bupropion and penicillins.  Allergy Precautions: None required Coagulopathies: Reviewed. None identified.  Blood-thinner therapy: None at this time Active Infection(s): Reviewed. None identified. Laura [name] is afebrile  Site Confirmation: Laura [name] was asked to confirm the procedure and laterality before marking the site Procedure checklist: Completed Consent: Before the procedure and under the influence of no sedative(s), amnesic(s), or anxiolytics, the patient was informed of the treatment options, risks and possible complications. To fulfill our ethical and legal obligations, as recommended by the American Medical Association's Code of Ethics, I have informed the patient of my clinical impression; the nature and purpose of the treatment or procedure; the risks, benefits, and possible complications of the intervention; the alternatives, including doing nothing; the risk(s) and benefit(s) of the alternative treatment(s) or procedure(s); and the risk(s) and benefit(s) of doing nothing. The patient was provided information about the general risks and possible complications associated with the procedure. These may include, but are not limited to: failure to achieve desired goals, infection, bleeding, organ or nerve damage, allergic reactions, paralysis, and death. In addition, the patient was informed of those risks and complications associated to the procedure, such as failure to decrease pain; infection; bleeding; organ or nerve damage with subsequent damage to sensory, motor, and/or autonomic systems, resulting in permanent pain, numbness, and/or weakness of one or several areas of the body; allergic reactions; (i.e.:  anaphylactic reaction); and/or death. Furthermore, the patient was informed of those risks and complications associated with the medications. These include, but are not limited to: allergic reactions (i.e.: anaphylactic or anaphylactoid  reaction(s)); adrenal axis suppression; blood sugar elevation that in diabetics may result in ketoacidosis or comma; water retention that in patients with history of congestive heart failure may result in shortness of breath, pulmonary edema, and decompensation with resultant heart failure; weight gain; swelling or edema; medication-induced neural toxicity; particulate matter embolism and blood vessel occlusion with resultant organ, and/or nervous system infarction; and/or aseptic necrosis of one or more joints. Finally, the patient was informed that Medicine is not an exact science; therefore, there is also the possibility of unforeseen or unpredictable risks and/or possible complications that may result in a catastrophic outcome. The patient indicated having understood very clearly. We have given the patient no guarantees and we have made no promises. Enough time was given to the patient to ask questions, all of which were answered to the patient's satisfaction. Laura [name] has indicated that she wanted to continue with the procedure. Attestation: I, the ordering provider, attest that I have discussed with the patient the benefits, risks, side-effects, alternatives, likelihood of achieving goals, and potential problems during recovery for the procedure that I have provided informed consent. Date: 09/01/2016; Time: 12:10 PM  Pre-Procedure Preparation:  Monitoring: As per clinic protocol. Respiration, ETCO2, SpO2, BP, heart rate and rhythm monitor placed and checked for adequate function Safety Precautions: Patient was assessed for positional comfort and pressure points before starting the procedure. Time-out: I initiated and conducted the "Time-out" before starting the  procedure, as per protocol. The patient was asked to participate by confirming the accuracy of the "Time Out" information. Verification of the correct person, site, and procedure were performed and confirmed by me, the nursing staff, and the patient. "Time-out" conducted as per Joint Commission's Universal Protocol (UP.01.01.01). "Time-out" Date & Time: 09/01/2016; 1227 hrs.  Description of Procedure Process:   Position: Supine Target Area: Knee Joint Approach: Just above the Medial tibial plateau, lateral to the infrapatellar tendon. Area Prepped: Entire knee area, from the mid-thigh to the mid-shin. Prepping solution: ChloraPrep (2% chlorhexidine gluconate and 70% isopropyl alcohol) Safety Precautions: Aspiration looking for blood return was conducted prior to all injections. At no point did we inject any substances, as a needle was being advanced. No attempts were made at seeking any paresthesias. Safe injection practices and needle disposal techniques used. Medications properly checked for expiration dates. SDV (single dose vial) medications used. Description of the Procedure: Protocol guidelines were followed. The patient was placed in position over the fluoroscopy table. The target area was identified and the area prepped in the usual manner. Skin desensitized using vapocoolant spray. Skin & deeper tissues infiltrated with local anesthetic. Appropriate amount of time allowed to pass for local anesthetics to take effect. The procedure needles were then advanced to the target area. Proper needle placement secured. Negative aspiration confirmed. Solution injected in intermittent fashion, asking for systemic symptoms every 0.5cc of injectate. The needles were then removed and the area cleansed, making sure to leave some of the prepping solution back to take advantage of its long term bactericidal properties. Vitals:   09/01/16 1126 09/01/16 1228 09/01/16 1233  BP: 124/83 (!) 142/85 119/68  Pulse: 99  90 92  Resp: 16 18 (!) 21  Temp: 98.4 F (36.9 C)    SpO2: 97% 95% 98%  Weight: 275 lb (124.7 kg)    Height: 5\' 5"  (1.651 m)      Start Time: 1227 hrs. End Time: 1234 hrs. Materials:  Needle(s) Type: Regular needle Gauge: 22G Length: 3.5-in Medication(s): We administered lidocaine (PF), Sodium  Hyaluronate, and ropivacaine (PF) 2 mg/mL (0.2%). Please see chart orders for dosing details.  Imaging Guidance:  Type of Imaging Technique: Fluoroscopy Guidance (Non-spinal) Indication(s): Assistance in needle guidance and placement for procedures requiring needle placement in or near specific anatomical locations not easily accessible without such assistance. Exposure Time: Please see nurses notes. Contrast: None used. Fluoroscopic Guidance: I was personally present during the use of fluoroscopy. "Tunnel Vision Technique" used to obtain the best possible view of the target area. Parallax error corrected before commencing the procedure. "Direction-depth-direction" technique used to introduce the needle under continuous pulsed fluoroscopy. Once target was reached, antero-posterior, oblique, and lateral fluoroscopic projection used confirm needle placement in all planes. Images permanently stored in EMR. Ultrasound Guidance: N/A Interpretation: No contrast injected. I personally interpreted the imaging intraoperatively. Adequate needle placement confirmed in multiple planes. Permanent images saved into the patient's record. There is clear evidence of severe osteoarthritis of the medial compartment on the right knee with significantly decreased between the femur and tibial. I will be ordering an MRI and on orthopedics surgery consult for possible knee replacement.  Antibiotic Prophylaxis:  Indication(s): None identified Antibiotic given: None  Post-operative Assessment:  EBL: None Complications: No immediate post-treatment complications observed by team, or reported by patient. Note: The patient  tolerated the entire procedure well. A repeat set of vitals were taken after the procedure and the patient was kept under observation following institutional policy, for this type of procedure. Post-procedural neurological assessment was performed, showing return to baseline, prior to discharge. The patient was provided with post-procedure discharge instructions, including a section on how to identify potential problems. Should any problems arise concerning this procedure, the patient was given instructions to immediately contact us, at any time, without hesitation. In any case, we plan to contact the patient by telephone for a follow-up status report regarding this interventional procedure. Comments:  No additional relevant information.  Plan of Care  Disposition: Discharge home  Discharge Date & Time: 09/01/2016; 1237 hrs.  Physician-requested Follow-up:  Return for Procedure (no sedation): Right-sided Hyalgan No. 3.  Future Appointments Date Time Provider Department Center  09/22/2016 10:15 AM Delano Metz, MD [facility] None  10/10/2016 2:30 PM Barbette Merino, NP [facility] None  11/15/2016 2:00 PM Patrick North, MD [facility] None    Imaging Orders     DG C-Arm 1-60 Min-No Report     MR KNEE LEFT WO CONTRAST  Procedure Orders     KNEE INJECTION     KNEE INJECTION  Medications ordered for procedure: Meds ordered this encounter  Medications  . lidocaine (PF) (XYLOCAINE) 1 % injection 5 mL  . Sodium Hyaluronate SOSY 2 mL  . ropivacaine (PF) 2 mg/mL (0.2%) (NAROPIN) injection 2 mL   Medications administered: We administered lidocaine (PF), Sodium Hyaluronate, and ropivacaine (PF) 2 mg/mL (0.2%).  See the medical record for exact dosing, route, and time of administration.  New Prescriptions   No medications on file   Primary Care Physician: Leotis Shames, MD Location: [facility] Note by: Oswaldo Done, MD Date: 09/01/2016; Time: 1:02  PM  Disclaimer:  Medicine is not an Visual merchandiser. The only guarantee in medicine is that nothing is guaranteed. It is important to note that the decision to proceed with this intervention was based on the information collected from the patient. The Data and conclusions were drawn from the patient's questionnaire, the interview, and the physical examination. Because the information was provided in large part by the patient,  it cannot be guaranteed that it has not been purposely or unconsciously manipulated. Every effort has been made to obtain as much relevant data as possible for this evaluation. It is important to note that the conclusions that lead to this procedure are derived in large part from the available data. Always take into account that the treatment will also be dependent on availability of resources and existing treatment guidelines, considered by other Pain Management Practitioners as being common knowledge and practice, at the time of the intervention. For Medico-Legal purposes, it is also important to point out that variation in procedural techniques and pharmacological choices are the acceptable norm. The indications, contraindications, technique, and results of the above procedure should only be interpreted and judged by a Board-Certified Interventional Pain Specialist with extensive familiarity and expertise in the same exact procedure and technique.

## 2016-09-01 NOTE — Patient Instructions (Addendum)
____________________________________________________________________________________________  Preparing for your procedure (without sedation) Instructions: . Oral Intake: Do not eat or drink anything for at least 3 hours prior to your procedure. . Transportation: Unless otherwise stated by your physician, you may drive yourself after the procedure. . Blood Pressure Medicine: Take your blood pressure medicine with a sip of water the morning of the procedure. . Blood thinners:  . Diabetics on insulin: Notify the staff so that you can be scheduled 1st case in the morning. If your diabetes requires high dose insulin, take only  of your normal insulin dose the morning of the procedure and notify the staff that you have done so. . Preventing infections: Shower with an antibacterial soap the morning of your procedure.  . Build-up your immune system: Take 1000 mg of Vitamin C with every meal (3 times a day) the day prior to your procedure. . Antibiotics: Inform the staff if you have a condition or reason that requires you to take antibiotics before dental procedures. . Pregnancy: If you are pregnant, call and cancel the procedure. . Sickness: If you have a cold, fever, or any active infections, call and cancel the procedure. . Arrival: You must be in the facility at least 30 minutes prior to your scheduled procedure. . Children: Do not bring any children with you. . Dress appropriately: Bring dark clothing that you would not mind if they get stained. . Valuables: Do not bring any jewelry or valuables. Procedure appointments are reserved for interventional treatments only. . No Prescription Refills. . No medication changes will be discussed during procedure appointments. . No disability issues will be discussed. ____________________________________________________________________________________________  Pain Management Discharge Instructions  General Discharge Instructions :  If you need to  reach your doctor call: Monday-Friday 8:00 am - 4:00 pm at [phone] or toll free [phone].  After clinic hours [phone] to have operator reach doctor.  Bring all of your medication bottles to all your appointments in the pain clinic.  To cancel or reschedule your appointment with Pain Management please remember to call 24 hours in advance to avoid a fee.  Refer to the educational materials which you have been given on: General Risks, I had my Procedure. Discharge Instructions, Post Sedation.  Post Procedure Instructions:  The drugs you were given will stay in your system until tomorrow, so for the next 24 hours you should not drive, make any legal decisions or drink any alcoholic beverages.  You may eat anything you prefer, but it is better to start with liquids then soups and crackers, and gradually work up to solid foods.  Please notify your doctor immediately if you have any unusual bleeding, trouble breathing or pain that is not related to your normal pain.  Depending on the type of procedure that was done, some parts of your body may feel week and/or numb.  This usually clears up by tonight or the next day.  Walk with the use of an assistive device or accompanied by an adult for the 24 hours.  You may use ice on the affected area for the first 24 hours.  Put ice in a Ziploc bag and cover with a towel and place against area 15 minutes on 15 minutes off.  You may switch to heat after 24 hours. Knee Injection A knee injection is a procedure to get medicine into your knee joint. Your health care provider puts a needle into the joint and injects medicine with an attached syringe. The injected medicine may relieve the pain, swelling,   and stiffness of arthritis. The injected medicine may also help to lubricate and cushion your knee joint. You may need more than one injection. Tell a health care provider about:  Any allergies you have.  All medicines you are taking, including  vitamins, herbs, eye drops, creams, and over-the-counter medicines.  Any problems you or family members have had with anesthetic medicines.  Any blood disorders you have.  Any surgeries you have had.  Any medical conditions you have. What are the risks? Generally, this is a safe procedure. However, problems may occur, including:  Infection.  Bleeding.  Worsening symptoms.  Damage to the area around your knee.  Allergic reaction to any of the medicines.  Skin reactions from repeated injections.  What happens before the procedure?  Ask your health care provider about changing or stopping your regular medicines. This is especially important if you are taking diabetes medicines or blood thinners.  Plan to have someone take you home after the procedure. What happens during the procedure?  You will sit or lie down in a position for your knee to be treated.  The skin over your kneecap will be cleaned with a germ-killing solution (antiseptic).  You will be given a medicine that numbs the area (local anesthetic). You may feel some stinging.  After your knee becomes numb, you will have a second injection. This is the medicine. This needle is carefully placed between your kneecap and your knee. The medicine is injected into the joint space.  At the end of the procedure, the needle will be removed.  A bandage (dressing) may be placed over the injection site. The procedure may vary among health care providers and hospitals. What happens after the procedure?  You may have to move your knee through its full range of motion. This helps to get all of the medicine into your joint space.  Your blood pressure, heart rate, breathing rate, and blood oxygen level will be monitored often until the medicines you were given have worn off.  You will be watched to make sure that you do not have a reaction to the injected medicine. This information is not intended to replace advice given to you  by your health care provider. Make sure you discuss any questions you have with your health care provider. Document Released: 03/20/2006 Document Revised: 05/29/2015 Document Reviewed: 11/06/2013 Elsevier Interactive Patient Education  2018 [employer]  

## 2016-09-01 NOTE — Progress Notes (Signed)
Safety precautions to be maintained throughout the outpatient stay will include: orient to surroundings, keep bed in low position, maintain call bell within reach at all times, provide assistance with transfer out of bed and ambulation.  

## 2016-09-02 ENCOUNTER — Telehealth: Payer: Self-pay

## 2016-09-02 ENCOUNTER — Telehealth: Payer: Self-pay | Admitting: *Deleted

## 2016-09-02 NOTE — Telephone Encounter (Signed)
Post procedure phone call.  Left message.  

## 2016-09-22 ENCOUNTER — Ambulatory Visit: Payer: PRIVATE HEALTH INSURANCE | Admitting: Pain Medicine

## 2016-09-24 ENCOUNTER — Other Ambulatory Visit: Payer: Self-pay | Admitting: Psychiatry

## 2016-10-10 ENCOUNTER — Ambulatory Visit: Payer: Medicare Other | Attending: Nurse Practitioner | Admitting: Nurse Practitioner

## 2016-10-10 ENCOUNTER — Encounter: Payer: Self-pay | Admitting: Nurse Practitioner

## 2016-10-10 VITALS — BP 152/50 | HR 109 | Temp 98.3°F | Resp 18 | Ht 65.0 in | Wt 270.0 lb

## 2016-10-10 DIAGNOSIS — N183 Chronic kidney disease, stage 3 (moderate): Secondary | ICD-10-CM | POA: Diagnosis not present

## 2016-10-10 DIAGNOSIS — M797 Fibromyalgia: Secondary | ICD-10-CM | POA: Insufficient documentation

## 2016-10-10 DIAGNOSIS — M0600 Rheumatoid arthritis without rheumatoid factor, unspecified site: Secondary | ICD-10-CM | POA: Insufficient documentation

## 2016-10-10 DIAGNOSIS — M25551 Pain in right hip: Secondary | ICD-10-CM | POA: Diagnosis not present

## 2016-10-10 DIAGNOSIS — G894 Chronic pain syndrome: Secondary | ICD-10-CM | POA: Insufficient documentation

## 2016-10-10 DIAGNOSIS — R569 Unspecified convulsions: Secondary | ICD-10-CM | POA: Diagnosis not present

## 2016-10-10 DIAGNOSIS — G4730 Sleep apnea, unspecified: Secondary | ICD-10-CM | POA: Insufficient documentation

## 2016-10-10 DIAGNOSIS — M5441 Lumbago with sciatica, right side: Secondary | ICD-10-CM

## 2016-10-10 DIAGNOSIS — E039 Hypothyroidism, unspecified: Secondary | ICD-10-CM | POA: Diagnosis not present

## 2016-10-10 DIAGNOSIS — R413 Other amnesia: Secondary | ICD-10-CM | POA: Diagnosis not present

## 2016-10-10 DIAGNOSIS — Z7982 Long term (current) use of aspirin: Secondary | ICD-10-CM | POA: Diagnosis not present

## 2016-10-10 DIAGNOSIS — M25561 Pain in right knee: Secondary | ICD-10-CM | POA: Diagnosis not present

## 2016-10-10 DIAGNOSIS — M545 Low back pain: Secondary | ICD-10-CM | POA: Insufficient documentation

## 2016-10-10 DIAGNOSIS — IMO0001 Reserved for inherently not codable concepts without codable children: Secondary | ICD-10-CM

## 2016-10-10 DIAGNOSIS — K5903 Drug induced constipation: Secondary | ICD-10-CM | POA: Insufficient documentation

## 2016-10-10 DIAGNOSIS — Z8249 Family history of ischemic heart disease and other diseases of the circulatory system: Secondary | ICD-10-CM | POA: Insufficient documentation

## 2016-10-10 DIAGNOSIS — M5442 Lumbago with sciatica, left side: Secondary | ICD-10-CM

## 2016-10-10 DIAGNOSIS — M25552 Pain in left hip: Secondary | ICD-10-CM | POA: Insufficient documentation

## 2016-10-10 DIAGNOSIS — M160 Bilateral primary osteoarthritis of hip: Secondary | ICD-10-CM | POA: Insufficient documentation

## 2016-10-10 DIAGNOSIS — I509 Heart failure, unspecified: Secondary | ICD-10-CM | POA: Insufficient documentation

## 2016-10-10 DIAGNOSIS — E1122 Type 2 diabetes mellitus with diabetic chronic kidney disease: Secondary | ICD-10-CM | POA: Insufficient documentation

## 2016-10-10 DIAGNOSIS — E7800 Pure hypercholesterolemia, unspecified: Secondary | ICD-10-CM | POA: Insufficient documentation

## 2016-10-10 DIAGNOSIS — M48062 Spinal stenosis, lumbar region with neurogenic claudication: Secondary | ICD-10-CM | POA: Insufficient documentation

## 2016-10-10 DIAGNOSIS — M488X6 Other specified spondylopathies, lumbar region: Secondary | ICD-10-CM | POA: Insufficient documentation

## 2016-10-10 DIAGNOSIS — Z79899 Other long term (current) drug therapy: Secondary | ICD-10-CM | POA: Diagnosis not present

## 2016-10-10 DIAGNOSIS — M47816 Spondylosis without myelopathy or radiculopathy, lumbar region: Secondary | ICD-10-CM | POA: Insufficient documentation

## 2016-10-10 DIAGNOSIS — M1990 Unspecified osteoarthritis, unspecified site: Secondary | ICD-10-CM | POA: Diagnosis not present

## 2016-10-10 DIAGNOSIS — Z79891 Long term (current) use of opiate analgesic: Secondary | ICD-10-CM | POA: Insufficient documentation

## 2016-10-10 DIAGNOSIS — G8929 Other chronic pain: Secondary | ICD-10-CM | POA: Diagnosis not present

## 2016-10-10 DIAGNOSIS — Z5181 Encounter for therapeutic drug level monitoring: Secondary | ICD-10-CM | POA: Insufficient documentation

## 2016-10-10 DIAGNOSIS — T402X5A Adverse effect of other opioids, initial encounter: Secondary | ICD-10-CM | POA: Diagnosis not present

## 2016-10-10 DIAGNOSIS — F419 Anxiety disorder, unspecified: Secondary | ICD-10-CM | POA: Insufficient documentation

## 2016-10-10 DIAGNOSIS — M79641 Pain in right hand: Secondary | ICD-10-CM | POA: Diagnosis present

## 2016-10-10 DIAGNOSIS — Z818 Family history of other mental and behavioral disorders: Secondary | ICD-10-CM | POA: Insufficient documentation

## 2016-10-10 DIAGNOSIS — Z880 Allergy status to penicillin: Secondary | ICD-10-CM | POA: Insufficient documentation

## 2016-10-10 DIAGNOSIS — F325 Major depressive disorder, single episode, in full remission: Secondary | ICD-10-CM | POA: Insufficient documentation

## 2016-10-10 DIAGNOSIS — Z96652 Presence of left artificial knee joint: Secondary | ICD-10-CM | POA: Insufficient documentation

## 2016-10-10 DIAGNOSIS — I130 Hypertensive heart and chronic kidney disease with heart failure and stage 1 through stage 4 chronic kidney disease, or unspecified chronic kidney disease: Secondary | ICD-10-CM | POA: Diagnosis not present

## 2016-10-10 DIAGNOSIS — Z823 Family history of stroke: Secondary | ICD-10-CM | POA: Insufficient documentation

## 2016-10-10 DIAGNOSIS — Z6841 Body Mass Index (BMI) 40.0 and over, adult: Secondary | ICD-10-CM | POA: Diagnosis not present

## 2016-10-10 DIAGNOSIS — Z888 Allergy status to other drugs, medicaments and biological substances status: Secondary | ICD-10-CM | POA: Insufficient documentation

## 2016-10-10 DIAGNOSIS — M170 Bilateral primary osteoarthritis of knee: Secondary | ICD-10-CM | POA: Insufficient documentation

## 2016-10-10 DIAGNOSIS — G4700 Insomnia, unspecified: Secondary | ICD-10-CM | POA: Insufficient documentation

## 2016-10-10 DIAGNOSIS — K219 Gastro-esophageal reflux disease without esophagitis: Secondary | ICD-10-CM | POA: Diagnosis not present

## 2016-10-10 DIAGNOSIS — F411 Generalized anxiety disorder: Secondary | ICD-10-CM | POA: Insufficient documentation

## 2016-10-10 DIAGNOSIS — Z811 Family history of alcohol abuse and dependence: Secondary | ICD-10-CM | POA: Insufficient documentation

## 2016-10-10 DIAGNOSIS — Z8261 Family history of arthritis: Secondary | ICD-10-CM | POA: Insufficient documentation

## 2016-10-10 MED ORDER — OXYCODONE HCL 5 MG PO TABS: 5.0000 mg | ORAL_TABLET | Freq: Four times a day (QID) | ORAL | 0 refills | Status: DC | PRN

## 2016-10-10 MED ORDER — LUBIPROSTONE 24 MCG PO CAPS: 24.0000 ug | ORAL_CAPSULE | Freq: Two times a day (BID) | ORAL | 0 refills | Status: DC

## 2016-10-10 NOTE — Progress Notes (Signed)
Nursing Pain Medication Assessment:  Safety precautions to be maintained throughout the outpatient stay will include: orient to surroundings, keep bed in low position, maintain call bell within reach at all times, provide assistance with transfer out of bed and ambulation.  Medication Inspection Compliance: Pill count conducted under aseptic conditions, in front of the patient. Neither the pills nor the bottle was removed from the patient's sight at any time. Once count was completed pills were immediately returned to the patient in their original bottle.  Medication: Oxycodone IR Pill/Patch Count: 5 of 120 pills remain Pill/Patch Appearance: Markings consistent with prescribed medication Bottle Appearance: Standard pharmacy container. Clearly labeled. Filled Date: 09/ 13/ 2018 Last Medication intake:  Burgess Estelle

## 2016-10-10 NOTE — Progress Notes (Signed)
[name]  MRN: [id number]  Referring Provider: Glendon Axe, MD  DOB: [birth year]  PCP: Glendon Axe, MD  DOS: 10/10/2016  Note by: Vevelyn Francois NP  Service setting: Ambulatory outpatient  Specialty: Interventional Pain Management  Location: [facility] (AMB) Pain Management Facility    Patient type: Established    Primary Reason(s) for Visit: Encounter for prescription drug management. (Level of risk: moderate)  CC: Hand Pain (bilateral) and Knee Pain (right)  HPI  [name] is a 71 y.o. year old, female patient, who comes today for a medication management evaluation. She has Chronic kidney disease (CKD), stage III (moderate) ([place]); Depressive disorder; Essential (primary) hypertension; GERD (gastroesophageal reflux disease); Adult hypothyroidism; Hypercholesterolemia; Extreme obesity ([place]); Insomnia secondary to chronic pain; Congestive heart failure with left ventricular systolic dysfunction ([place]); Type 2 diabetes mellitus ([place]); Morbid obesity ([place]); Seronegative rheumatoid arthritis ([employer]); Chronic knee pain (Right); Opiate use (30 MME/Day); Long term current use of opiate analgesic; Long term prescription opiate use; Encounter for therapeutic drug level monitoring; Opioid dependence ([place]); Lumbar spinal stenosis (5 mm Severe L3-4; 8 mm L4-5); Lumbar spondylosis; Lumbar facet syndrome (Location of Primary Source of Pain) (Bilateral) (L>R); Chronic pain syndrome; Chronic low back pain (Location of Primary Source of Pain) (Bilateral) (L>R); History of TKR (total knee replacement) (Left); History of femur fracture (Right); 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 Her primarily concern today is the Hand Pain (bilateral) and Knee Pain (right)  Pain Assessment: Location: Right, Left Hand (right knee) Radiating: denies Onset: More than a month ago Duration: Chronic pain Quality: Aching, Tightness Severity: 3 /10 (self-reported pain score)  Note: Reported level is compatible with observation.                    Effect on ADL:   Timing: Constant Modifying factors: rest  [name] was last scheduled for an appointment on 07/11/2016 for medication management. During today's appointment we reviewed [name]'s chronic pain status, as well as her outpatient medication regimen.  The patient  reports that she does not use drugs. Her body mass index is 44.93 kg/m.  Further details on both, my assessment(s), as well as the proposed treatment plan, please see below.  Controlled Substance Pharmacotherapy Assessment REMS (Risk Evaluation and Mitigation Strategy)  Analgesic:Oxycodone IR 5 mg every 6 hours (20 mg/day) MME/day:30 mg/day Dewayne Shorter, RN  10/10/2016  2:56 PM  Signed Nursing Pain Medication Assessment:  Safety precautions to be maintained throughout the outpatient stay will include: orient to surroundings, keep bed in low position, maintain call bell within reach at all times, provide assistance with transfer out of bed and ambulation.  Medication Inspection Compliance: Pill count conducted under aseptic conditions, in front of the patient. Neither the pills nor the bottle was removed from the patient's sight at any time. Once count was completed pills were immediately returned to the patient in their original bottle.  Medication: Oxycodone IR Pill/Patch Count: 5 of 120 pills  remain Pill/Patch Appearance: Markings consistent with prescribed medication Bottle Appearance: Standard pharmacy container. Clearly labeled.  Filled Date: 09/ 13/ 2018 Last Medication intake:  Yesterday   Pharmacokinetics: Liberation and absorption (onset of action): WNL Distribution (time to peak effect): WNL Metabolism and excretion (duration of action): WNL         Pharmacodynamics: Desired effects: Analgesia: Ms. Folds reports >50% benefit. Functional ability: Patient reports that medication allows her to accomplish basic ADLs Clinically meaningful improvement in function (CMIF): Sustained CMIF goals met Perceived effectiveness: Described as relatively effective, allowing for increase in activities of daily living (ADL) Undesirable effects: Side-effects or Adverse reactions: None reported Monitoring: [place] PMP: Online review of the past 58-monthperiod conducted. Discrepancies found between information provided by the patient and the database Last UDS on record: Summary  Date Value Ref Range Status  07/11/2016 FINAL  Final    Comment:    ==================================================================== TOXASSURE SELECT 13 (MW) ==================================================================== Test                             Result       Flag       Units Drug Present and Declared for Prescription Verification   Oxycodone                      575          EXPECTED   ng/mg creat   Oxymorphone                    226          EXPECTED   ng/mg creat   Noroxycodone                   1580         EXPECTED   ng/mg creat   Noroxymorphone                 237          EXPECTED   ng/mg creat    Sources of oxycodone are scheduled prescription medications.    Oxymorphone, noroxycodone, and noroxymorphone are expected    metabolites of oxycodone. Oxymorphone is also available as a    scheduled prescription medication.   Tramadol                       PRESENT      EXPECTED    O-Desmethyltramadol            PRESENT      EXPECTED   N-Desmethyltramadol            PRESENT      EXPECTED    Source of tramadol is a prescription medication.    O-desmethyltramadol and N-desmethyltramadol are expected    metabolites of tramadol. ==================================================================== Test                      Result    Flag   Units      Ref Range   Creatinine              168              mg/dL      >=20 ==================================================================== Declared Medications:  The flagging and interpretation on this report are based on the  following declared medications.  Unexpected results may arise from  inaccuracies in the declared medications.  **Note: The testing scope of this panel includes these medications:  Oxycodone  Tramadol  **  Note: The testing scope of this panel does not include following  reported medications:  Amlodipine (Norvasc)  Aspirin (Aspirin 81)  Chlorthalidone  Docusate (Colace)  Duloxetine (Cymbalta)  Folic acid (Folvite)  Furosemide (Lasix)  Hydroxychloroquine (Plaquenil)  Infliximab  Levothyroxine  Loratadine (Claritin)  Lubiprostone (Amitiza)  Methotrexate  Metoprolol  Multivitamin  Omeprazole (Nexium)  Quetiapine (Seroquel)  Spironolactone (Aldactone)  Vitamin D ==================================================================== For clinical consultation, please call [phone]. ====================================================================    UDS interpretation: Compliant          Medication Assessment Form: Reviewed. Patient indicates being compliant with therapy Treatment compliance: Compliant Risk Assessment Profile: Aberrant behavior: See prior evaluations. None observed or detected today Comorbid factors increasing risk of overdose: See prior notes. No additional risks detected today Risk of substance use disorder (SUD): Low     Opioid Risk Tool - 08/18/16 1116       Family History of Substance Abuse   Alcohol Negative   Illegal Drugs Negative   Rx Drugs Negative     Personal History of Substance Abuse   Alcohol Negative   Illegal Drugs Negative   Rx Drugs Negative     Age   Age between 22-45 years  No     History of Preadolescent Sexual Abuse   History of Preadolescent Sexual Abuse Negative or Female     Psychological Disease   Psychological Disease Negative   Depression Negative     Total Score   Opioid Risk Tool Scoring 0   Opioid Risk Interpretation Low Risk     ORT Scoring interpretation table:  Score <3 = Low Risk for SUD  Score between 4-7 = Moderate Risk for SUD  Score >8 = High Risk for Opioid Abuse   Risk Mitigation Strategies:  Patient Counseling: Covered Patient-Prescriber Agreement (PPA): Present and active  Notification to other healthcare providers: Done  Pharmacologic Plan: No change in therapy, at this time  Laboratory Chemistry  Inflammation Markers (CRP: Acute Phase) (ESR: Chronic Phase) Lab Results  Component Value Date   CRP 1.9 (H) 07/15/2015   ESRSEDRATE 32 (H) 07/15/2015                 Renal Function Markers Lab Results  Component Value Date   BUN 21 (H) 07/15/2015   CREATININE 1.22 (H) 07/15/2015   GFRAA 51 (L) 07/15/2015   GFRNONAA 44 (L) 07/15/2015                 Hepatic Function Markers Lab Results  Component Value Date   AST 19 07/15/2015   ALT 15 07/15/2015   ALBUMIN 4.0 07/15/2015   ALKPHOS 73 07/15/2015                 Electrolytes Lab Results  Component Value Date   NA 140 07/15/2015   K 3.7 07/15/2015   CL 101 07/15/2015   CALCIUM 9.1 07/15/2015   MG 2.1 07/15/2015                 Neuropathy Markers Lab Results  Component Value Date   VITAMINB12 610 07/15/2015                 Bone Pathology Markers Lab Results  Component Value Date   ALKPHOS 73 07/15/2015   25OHVITD1 36 07/15/2015   25OHVITD2 3.6 07/15/2015   25OHVITD3 32 07/15/2015   CALCIUM 9.1 07/15/2015                  Coagulation Parameters Lab  Results  Component Value Date   PLT 251 06/12/2013                 Cardiovascular Markers Lab Results  Component Value Date   HGB 13.6 06/12/2013   HCT 41.8 06/12/2013                 Note: Lab results reviewed.  Recent Diagnostic Imaging Results  DG C-Arm 1-60 Min-No Report Fluoroscopy was utilized by the requesting physician.  No radiographic  interpretation.   Note: Imaging results reviewed.        Meds   Current Outpatient Prescriptions:  .  amLODipine (NORVASC) 10 MG tablet, Take 10 mg by mouth 2 (two) times daily. , Disp: , Rfl: 11 .  aspirin EC 81 MG tablet, Take by mouth., Disp: , Rfl:  .  Cholecalciferol (VITAMIN D) 2000 UNITS tablet, Take by mouth daily. , Disp: , Rfl:  .  docusate sodium (COLACE) 100 MG capsule, Take 100 mg by mouth 2 (two) times daily., Disp: , Rfl:  .  DULoxetine (CYMBALTA) 30 MG capsule, Two tablets in the morning, and one in the evening., Disp: 90 capsule, Rfl: 2 .  esomeprazole (NEXIUM) 20 MG capsule, Take by mouth daily. , Disp: , Rfl:  .  folic acid (FOLVITE) 1 MG tablet, Take 1 mg by mouth daily., Disp: , Rfl:  .  furosemide (LASIX) 40 MG tablet, TAKE 1 TABLET ONCE DAILY, Disp: , Rfl:  .  hydroxychloroquine (PLAQUENIL) 200 MG tablet, Take 200 mg by mouth daily. , Disp: , Rfl:  .  InFLIXimab-dyyb (INFLECTRA) 100 MG SOLR, Inject into the vein every 6 (six) weeks. , Disp: , Rfl:  .  levothyroxine (SYNTHROID, LEVOTHROID) 200 MCG tablet, TAKE 1 TABLET ONCE DAILY- ON AN EMPTY STOMACH WITH A GLASS OF WATER 30-60 MINUTES BEFORE BREAKFAST, Disp: , Rfl: 5 .  levothyroxine (SYNTHROID, LEVOTHROID) 50 MCG tablet, TAKE 1 TABLET BY MOUTH ONCE DAILY (WITH 200 MCG TAB FOR 250 MCG TOTAL). 30-60 MIN BEFORE BREAKFAST, Disp: , Rfl: 1 .  loratadine (CLARITIN) 10 MG tablet, Take by mouth daily as needed. , Disp: , Rfl:  .  methotrexate (RHEUMATREX) 2.5 MG tablet, TAKE 4 TABLETS (10 MG TOTAL) BY MOUTH EVERY 7 (SEVEN) DAYS  WITH A MEAL, Disp: , Rfl: 5 .  metoprolol succinate (TOPROL-XL) 50 MG 24 hr tablet, TAKE 1 TABLET (50 MG TOTAL) BY MOUTH ONCE DAILY., Disp: , Rfl: 1 .  Multiple Vitamin (MULTI-VITAMINS) TABS, Take by mouth., Disp: , Rfl:  .  [START ON 10/22/2016] oxyCODONE (OXY IR/ROXICODONE) 5 MG immediate release tablet, Take 1 tablet (5 mg total) by mouth every 6 (six) hours as needed for severe pain., Disp: 120 tablet, Rfl: 0 .  QUEtiapine (SEROQUEL) 200 MG tablet, Take 1 tablet (200 mg total) by mouth at bedtime., Disp: 90 tablet, Rfl: 0 .  QUEtiapine (SEROQUEL) 50 MG tablet, Take 1 tablet (50 mg total) by mouth 2 (two) times daily., Disp: 180 tablet, Rfl: 0 .  spironolactone (ALDACTONE) 25 MG tablet, TAKE 1/2 (ONE-HALF) TABLET BY MOUTH DAILY, Disp: , Rfl: 11 .  chlorthalidone (HYGROTON) 25 MG tablet, TAKE 1/2 TABLET (12.5MG) BY MOUTH EVERY DAY, Disp: , Rfl: 11 .  levothyroxine (SYNTHROID, LEVOTHROID) 25 MCG tablet, TAKE 2 TABLET BY MOUTH EVERY DAY (TAKE WITH 20O MCG TABLET TO EQUAL A 225 MCG DOSE), Disp: , Rfl: 5 .  levothyroxine (SYNTHROID, LEVOTHROID) 50 MCG tablet, TAKE 1 TABLET BY MOUTH ONCE DAILY (WITH 200 MCG TAB  FOR 250 MCG TOTAL). 30-60 MIN BEFORE BREAKFAST, Disp: , Rfl:  .  [START ON 10/22/2016] lubiprostone (AMITIZA) 24 MCG capsule, Take 1 capsule (24 mcg total) by mouth 2 (two) times daily with a meal. Swallow the medication whole. Do not break or chew the medication., Disp: 180 capsule, Rfl: 0 .  [START ON 11/21/2016] oxyCODONE (OXY IR/ROXICODONE) 5 MG immediate release tablet, Take 1 tablet (5 mg total) by mouth every 6 (six) hours as needed for severe pain., Disp: 120 tablet, Rfl: 0 .  [START ON 12/21/2016] oxyCODONE (OXY IR/ROXICODONE) 5 MG immediate release tablet, Take 1 tablet (5 mg total) by mouth every 6 (six) hours as needed for severe pain., Disp: 120 tablet, Rfl: 0 .  traMADol (ULTRAM) 50 MG tablet, TAKE ONE TABLET BY MOUTH EVERY EIGHT HOURS AS NEEDED FOR PAIN FOR UP TO FIVE DAYS., Disp: ,  Rfl: 3  ROS  Constitutional: Denies any fever or chills Gastrointestinal: No reported hemesis, hematochezia, vomiting, or acute GI distress Musculoskeletal: Denies any acute onset joint swelling, redness, loss of ROM, or weakness Neurological: No reported episodes of acute onset apraxia, aphasia, dysarthria, agnosia, amnesia, paralysis, loss of coordination, or loss of consciousness  Allergies  Ms. Tsao is allergic to bupropion and penicillins.  PFSH  Drug: Ms. Sheffer  reports that she does not use drugs. Alcohol:  reports that she does not drink alcohol. Tobacco:  reports that she has never smoked. She has never used smokeless tobacco. Medical:  has a past medical history of Allergy; Anxiety; Arthritis, degenerative (10/08/2013); Chronic kidney disease; Depression; Lumbar spinal stenosis with neurogenic claudication (11/07/2014); Major depression, single episode, in complete remission ([place]) (06/25/2015); Memory loss, short term (03/19/2014); Seizure ([place]) (10/07/2014); Sleep apnea; and Thyroid disease. Surgical: Ms. Sirois  has a past surgical history that includes Abdominal hysterectomy; Cesarean section; Replacement total knee (Left); and Hip surgery (Right). Family: family history includes Alcohol abuse in her father; Depression in her father and sister; Heart attack in her father; Hypertension in her mother and sister; Post-traumatic stress disorder in her father; Rheum arthritis in her sister; Stroke in her mother.  Constitutional Exam  General appearance: Well nourished, well developed, and well hydrated. In no apparent acute distress Vitals:   10/10/16 1444 10/10/16 1445  BP:  (!) 152/50  Pulse:  (!) 109  Resp:  18  Temp:  98.3 F (36.8 C)  SpO2:  96%  Weight: 270 lb (122.5 kg)   Height: '5\' 5"'  (1.651 m)   Psych/Mental status: Alert, oriented x 3 (person, place, & time)       Eyes: PERLA Respiratory: No evidence of acute respiratory distress  Cervical Spine Area Exam  Skin  & Axial Inspection: No masses, redness, edema, swelling, or associated skin lesions Alignment: Symmetrical Functional ROM: Unrestricted ROM      Stability: No instability detected Muscle Tone/Strength: Functionally intact. No obvious neuro-muscular anomalies detected. Sensory (Neurological): Unimpaired Palpation: No palpable anomalies              Upper Extremity (UE) Exam    Side: Right upper extremity  Side: Left upper extremity  Skin & Extremity Inspection: Skin color, temperature, and hair growth are WNL. No peripheral edema or cyanosis. No masses, redness, swelling, asymmetry, or associated skin lesions. No contractures.  Skin & Extremity Inspection: Skin color, temperature, and hair growth are WNL. No peripheral edema or cyanosis. No masses, redness, swelling, asymmetry, or associated skin lesions. No contractures.  Functional ROM: Unrestricted ROM  Functional ROM: Unrestricted ROM          Muscle Tone/Strength: Functionally intact. No obvious neuro-muscular anomalies detected.  Muscle Tone/Strength: Functionally intact. No obvious neuro-muscular anomalies detected.  Sensory (Neurological): Unimpaired          Sensory (Neurological): Unimpaired          Palpation: No palpable anomalies              Palpation: No palpable anomalies              Specialized Test(s): Deferred         Specialized Test(s): Deferred          Thoracic Spine Area Exam  Skin & Axial Inspection: No masses, redness, or swelling Alignment: Symmetrical Functional ROM: Unrestricted ROM Stability: No instability detected Muscle Tone/Strength: Functionally intact. No obvious neuro-muscular anomalies detected. Sensory (Neurological): Unimpaired Muscle strength & Tone: No palpable anomalies  Lumbar Spine Area Exam  Skin & Axial Inspection: No masses, redness, or swelling Alignment: Symmetrical Functional ROM: Unrestricted ROM      Stability: No instability detected Muscle Tone/Strength: Functionally  intact. No obvious neuro-muscular anomalies detected. Sensory (Neurological): Unimpaired Palpation: No palpable anomalies       Provocative Tests: Lumbar Hyperextension and rotation test: evaluation deferred today       Lumbar Lateral bending test: evaluation deferred today       Patrick's Maneuver: evaluation deferred today                    Gait & Posture Assessment  Ambulation: Patient ambulates using a walker Gait: Relatively normal for age and body habitus Posture: WNL   Lower Extremity Exam    Side: Right lower extremity  Side: Left lower extremity  Skin & Extremity Inspection: Skin color, temperature, and hair growth are WNL. No peripheral edema or cyanosis. No masses, redness, swelling, asymmetry, or associated skin lesions. No contractures.  Skin & Extremity Inspection: Skin color, temperature, and hair growth are WNL. No peripheral edema or cyanosis. No masses, redness, swelling, asymmetry, or associated skin lesions. No contractures.  Functional ROM: Unrestricted ROM          Functional ROM: Unrestricted ROM          Muscle Tone/Strength: Functionally intact. No obvious neuro-muscular anomalies detected.  Muscle Tone/Strength: Functionally intact. No obvious neuro-muscular anomalies detected.  Sensory (Neurological): Unimpaired  Sensory (Neurological): Unimpaired  Palpation: No palpable anomalies  Palpation: No palpable anomalies   Assessment  Primary Diagnosis & Pertinent Problem List: The primary encounter diagnosis was Chronic low back pain (Location of Primary Source of Pain) (Bilateral) (L>R). Diagnoses of Chronic arthralgias of knees and hips (Right), Lumbar facet syndrome (Location of Primary Source of Pain) (Bilateral) (L>R), Opioid-induced constipation (OIC), Chronic pain syndrome, and Long term current use of opiate analgesic were also pertinent to this visit.  Status Diagnosis  Controlled Controlled Controlled 1. Chronic low back pain (Location of Primary Source of  Pain) (Bilateral) (L>R)   2. Chronic arthralgias of knees and hips (Right)   3. Lumbar facet syndrome (Location of Primary Source of Pain) (Bilateral) (L>R)   4. Opioid-induced constipation (OIC)   5. Chronic pain syndrome   6. Long term current use of opiate analgesic     Problems updated and reviewed during this visit: No problems updated. Plan of Care  Pharmacotherapy (Medications Ordered): Meds ordered this encounter  Medications  . oxyCODONE (OXY IR/ROXICODONE) 5 MG immediate release tablet    Sig: Take 1  tablet (5 mg total) by mouth every 6 (six) hours as needed for severe pain.    Dispense:  120 tablet    Refill:  0    Do not add this medication to the electronic "Automatic Refill" notification system. Patient may have prescription filled one day early if pharmacy is closed on scheduled refill date. Do not fill until: 10/22/2016 To last until: 11/21/2016    Order Specific Question:   Supervising Provider    Answer:   Milinda Pointer [phone]  . oxyCODONE (OXY IR/ROXICODONE) 5 MG immediate release tablet    Sig: Take 1 tablet (5 mg total) by mouth every 6 (six) hours as needed for severe pain.    Dispense:  120 tablet    Refill:  0    Do not add this medication to the electronic "Automatic Refill" notification system. Patient may have prescription filled one day early if pharmacy is closed on scheduled refill date. Do not fill until: 11/21/2016 To last until: 12/21/2016    Order Specific Question:   Supervising Provider    Answer:   Milinda Pointer [phone]  . oxyCODONE (OXY IR/ROXICODONE) 5 MG immediate release tablet    Sig: Take 1 tablet (5 mg total) by mouth every 6 (six) hours as needed for severe pain.    Dispense:  120 tablet    Refill:  0    Do not add this medication to the electronic "Automatic Refill" notification system. Patient may have prescription filled one day early if pharmacy is closed on scheduled refill date.12/21/2016 Do not fill until: 01/20/2017     Order Specific Question:   Supervising Provider    Answer:   Milinda Pointer [phone]  . lubiprostone (AMITIZA) 24 MCG capsule    Sig: Take 1 capsule (24 mcg total) by mouth 2 (two) times daily with a meal. Swallow the medication whole. Do not break or chew the medication.    Dispense:  180 capsule    Refill:  0    Do not place this medication, or any other prescription from our practice, on "Automatic Refill". Patient may have prescription filled one day early if pharmacy is closed on scheduled refill date.    Order Specific Question:   Supervising Provider    Answer:   Milinda Pointer [[id number]]   New Prescriptions   No medications on file   Medications administered today: Ms. Radke had no medications administered during this visit. Lab-work, procedure(s), and/or referral(s): No orders of the defined types were placed in this encounter.  Imaging and/or referral(s): None  Interventional therapies: Planned, scheduled, and/or pending:   Not at this time.   Considering:   Therapeutic series of 5 Hyalgan right knee injections  Diagnostic intra-articular left hip joint injection  Possible left hip radiofrequency ablation  Diagnostic right intra-articular knee joint injection with local anesthetic and steroid  Diagnostic bilateral genicular nerve block  Possible bilateral genicular nerve radiofrequency ablation  Diagnostic bilateral lumbar facet block  Possible bilateral lumbar facet radiofrequency ablation  Diagnostic bilateral L3-4 and/or L5-S1 transforaminal epidural steroid injections  Diagnostic L3-4 versus L4-5 lumbar epidural steroid injection    Palliative PRN treatment(s):   Therapeutic series of 5 Hyalgan right knee injections Diagnostic intra-articular left hip joint injection  Diagnostic right intra-articular knee joint injection with local anesthetic and steroid  Diagnostic bilateral genicular nerve block  Diagnostic bilateral lumbar facet block   Diagnostic bilateral L3-4 and/or L5-S1 transforaminal epidural steroid injections  Diagnostic L3-4 versus L4-5 lumbar epidural steroid injection  Provider-requested follow-up: Return in about 3 months (around 01/10/2017) for MedMgmt.  Future Appointments Date Time Provider [place]  11/15/2016 2:00 PM Elvin So, MD [facility] None  12/28/2016 1:45 PM Vevelyn Francois, NP [facility] None   Primary Care Physician: Glendon Axe, MD Location: [facility] Out[facility] Management [facility] Note by: Vevelyn Francois NP Date: 10/10/2016; Time: 4:02 PM  Pain Score Disclaimer: We use the NRS-11 scale. This is a self-reported, subjective measurement of pain severity with only modest accuracy. It is used primarily to identify changes within a particular patient. It must be understood that outpatient pain scales are significantly less accurate that those used for research, where they can be applied under ideal controlled circumstances with minimal exposure to variables. In reality, the score is likely to be a combination of pain intensity and pain affect, where pain affect describes the degree of emotional arousal or changes in action readiness caused by the sensory experience of pain. Factors such as social and work situation, setting, emotional state, anxiety levels, expectation, and prior pain experience may influence pain perception and show large inter-individual differences that may also be affected by time variables.  Patient instructions provided during this appointment: Patient Instructions    ____________________________________________________________________________________________  Medication Rules  Applies to: All patients receiving prescriptions (written or electronic).  Pharmacy of record: Pharmacy where electronic prescriptions will be sent. If written prescriptions are taken to a different pharmacy, please inform the nursing staff. The pharmacy listed in the electronic medical  record should be the one where you would like electronic prescriptions to be sent.  Prescription refills: Only during scheduled appointments. Applies to both, written and electronic prescriptions.  NOTE: The following applies primarily to controlled substances (Opioid* Pain Medications).   Patient's responsibilities: 1. Pain Pills: Bring all pain pills to every appointment (except for procedure appointments). 2. Pill Bottles: Bring pills in original pharmacy bottle. Always bring newest bottle. Bring bottle, even if empty. 3. Medication refills: You are responsible for knowing and keeping track of what medications you need refilled. The day before your appointment, write a list of all prescriptions that need to be refilled. Bring that list to your appointment and give it to the admitting nurse. Prescriptions will be written only during appointments. If you forget a medication, it will not be "Called in", "Faxed", or "electronically sent". You will need to get another appointment to get these prescribed. 4. Prescription Accuracy: You are responsible for carefully inspecting your prescriptions before leaving our office. Have the discharge nurse carefully go over each prescription with you, before taking them home. Make sure that your name is accurately spelled, that your address is correct. Check the name and dose of your medication to make sure it is accurate. Check the number of pills, and the written instructions to make sure they are clear and accurate. Make sure that you are given enough medication to last until your next medication refill appointment. 5. Taking Medication: Take medication as prescribed. Never take more pills than instructed. Never take medication more frequently than prescribed. Taking less pills or less frequently is permitted and encouraged, when it comes to controlled substances (written prescriptions).  6. Inform other Doctors: Always inform, all of your healthcare providers, of  all the medications you take. 7. Pain Medication from other Providers: You are not allowed to accept any additional pain medication from any other Doctor or Healthcare provider. There are two exceptions to this rule. (see below) In the event that you require additional pain medication,  you are responsible for notifying us, as stated below. 8. Medication Agreement: You are responsible for carefully reading and following our Medication Agreement. This must be signed before receiving any prescriptions from our practice. Safely store a copy of your signed Agreement. Violations to the Agreement will result in no further prescriptions. (Additional copies of our Medication Agreement are available upon request.) 9. Laws, Rules, & Regulations: All patients are expected to follow all Federal and [employer], [employer], Rules, [employer]. Ignorance of the Laws does not constitute a valid excuse. The use of any illegal substances is prohibited. 10. Adopted CDC guidelines & recommendations: Target dosing levels will be at or below 60 MME/day. Use of benzodiazepines** is not recommended.  Exceptions: There are only two exceptions to the rule of not receiving pain medications from other Healthcare Providers. 1. Exception #1 (Emergencies): In the event of an emergency (i.e.: accident requiring emergency care), you are allowed to receive additional pain medication. However, you are responsible for: As soon as you are able, call our office [phone], at any time of the day or night, and leave a message stating your name, the date and nature of the emergency, and the name and dose of the medication prescribed. In the event that your call is answered by a member of our staff, make sure to document and save the date, time, and the name of the person that took your information.  2. Exception #2 (Planned Surgery): In the event that you are scheduled by another doctor or dentist to have any type of surgery or procedure, you  are allowed (for a period no longer than 30 days), to receive additional pain medication, for the acute post-op pain. However, in this case, you are responsible for picking up a copy of our "Post-op Pain Management for Surgeons" handout, and giving it to your surgeon or dentist. This document is available at our office, and does not require an appointment to obtain it. Simply go to our office during business hours (Monday-Thursday from 8:00 AM to 4:00 PM) (Friday 8:00 AM to 12:00 Noon) or if you have a scheduled appointment with [place], prior to your surgery, and ask for it by name. In addition, you will need to provide [place] with your name, name of your surgeon, type of surgery, and date of procedure or surgery.  *Opioid medications include: morphine, codeine, oxycodone, oxymorphone, hydrocodone, hydromorphone, meperidine, tramadol, tapentadol, buprenorphine, fentanyl, methadone. **Benzodiazepine medications include: diazepam (Valium), alprazolam (Xanax), clonazepam (Klonopine), lorazepam (Ativan), clorazepate (Tranxene), chlordiazepoxide (Librium), estazolam (Prosom), oxazepam (Serax), temazepam (Restoril), triazolam (Halcion)  ____________________________________________________________________________________________  BMI Assessment: Estimated body mass index is 44.93 kg/m as calculated from the following:   Height as of this encounter: '5\' 5"'  (1.651 m).   Weight as of this encounter: 270 lb (122.5 kg).  BMI interpretation table: BMI level Category Range association with higher incidence of chronic pain  <18 kg/m2 Underweight   18.5-24.9 kg/m2 Ideal body weight   25-29.9 kg/m2 Overweight Increased incidence by 20%  30-34.9 kg/m2 Obese (Class I) Increased incidence by 68%  35-39.9 kg/m2 Severe obesity (Class II) Increased incidence by 136%  >40 kg/m2 Extreme obesity (Class III) Increased incidence by 254%   BMI Readings from Last 4 Encounters:  10/10/16 44.93 kg/m  09/01/16 45.76 kg/m  08/18/16  44.93 kg/m  07/11/16 46.43 kg/m   Wt Readings from Last 4 Encounters:  10/10/16 270 lb (122.5 kg)  09/01/16 275 lb (124.7 kg)  08/18/16 270 lb (122.5 kg)  07/11/16 279 lb (  126.6 kg)  Pain Management Discharge Instructions  General Discharge Instructions :  If you need to reach your doctor call: Monday-Friday 8:00 am - 4:00 pm at [phone] or toll free [phone].  After clinic hours [phone] to have operator reach doctor.  Bring all of your medication bottles to all your appointments in the pain clinic.  To cancel or reschedule your appointment with Pain Management please remember to call 24 hours in advance to avoid a fee.  Refer to the educational materials which you have been given on: General Risks, I had my Procedure. Discharge Instructions, Post Sedation.  Post Procedure Instructions:  The drugs you were given will stay in your system until tomorrow, so for the next 24 hours you should not drive, make any legal decisions or drink any alcoholic beverages.  You may eat anything you prefer, but it is better to start with liquids then soups and crackers, and gradually work up to solid foods.  Please notify your doctor immediately if you have any unusual bleeding, trouble breathing or pain that is not related to your normal pain.  Depending on the type of procedure that was done, some parts of your body may feel week and/or numb.  This usually clears up by tonight or the next day.  Walk with the use of an assistive device or accompanied by an adult for the 24 hours.  You may use ice on the affected area for the first 24 hours.  Put ice in a Ziploc bag and cover with a towel and place against area 15 minutes on 15 minutes off.  You may switch to heat after 24 hours.

## 2016-10-10 NOTE — Patient Instructions (Addendum)
____________________________________________________________________________________________  Medication Rules  Applies to: All patients receiving prescriptions (written or electronic).  Pharmacy of record: Pharmacy where electronic prescriptions will be sent. If written prescriptions are taken to a different pharmacy, please inform the nursing staff. The pharmacy listed in the electronic medical record should be the one where you would like electronic prescriptions to be sent.  Prescription refills: Only during scheduled appointments. Applies to both, written and electronic prescriptions.  NOTE: The following applies primarily to controlled substances (Opioid* Pain Medications).   Patient's responsibilities: 1. Pain Pills: Bring all pain pills to every appointment (except for procedure appointments). 2. Pill Bottles: Bring pills in original pharmacy bottle. Always bring newest bottle. Bring bottle, even if empty. 3. Medication refills: You are responsible for knowing and keeping track of what medications you need refilled. The day before your appointment, write a list of all prescriptions that need to be refilled. Bring that list to your appointment and give it to the admitting nurse. Prescriptions will be written only during appointments. If you forget a medication, it will not be "Called in", "Faxed", or "electronically sent". You will need to get another appointment to get these prescribed. 4. Prescription Accuracy: You are responsible for carefully inspecting your prescriptions before leaving our office. Have the discharge nurse carefully go over each prescription with you, before taking them home. Make sure that your name is accurately spelled, that your address is correct. Check the name and dose of your medication to make sure it is accurate. Check the number of pills, and the written instructions to make sure they are clear and accurate. Make sure that you are given enough medication to  last until your next medication refill appointment. 5. Taking Medication: Take medication as prescribed. Never take more pills than instructed. Never take medication more frequently than prescribed. Taking less pills or less frequently is permitted and encouraged, when it comes to controlled substances (written prescriptions).  6. Inform other Doctors: Always inform, all of your healthcare providers, of all the medications you take. 7. Pain Medication from other Providers: You are not allowed to accept any additional pain medication from any other Doctor or Healthcare provider. There are two exceptions to this rule. (see below) In the event that you require additional pain medication, you are responsible for notifying us, as stated below. 8. Medication Agreement: You are responsible for carefully reading and following our Medication Agreement. This must be signed before receiving any prescriptions from our practice. Safely store a copy of your signed Agreement. Violations to the Agreement will result in no further prescriptions. (Additional copies of our Medication Agreement are available upon request.) 9. Laws, Rules, & Regulations: All patients are expected to follow all Federal and State Laws, Statutes, Rules, & Regulations. Ignorance of the Laws does not constitute a valid excuse. The use of any illegal substances is prohibited. 10. Adopted CDC guidelines & recommendations: Target dosing levels will be at or below 60 MME/day. Use of benzodiazepines** is not recommended.  Exceptions: There are only two exceptions to the rule of not receiving pain medications from other Healthcare Providers. 1. Exception #1 (Emergencies): In the event of an emergency (i.e.: accident requiring emergency care), you are allowed to receive additional pain medication. However, you are responsible for: As soon as you are able, call our office [phone], at any time of the day or night, and leave a message stating your  name, the date and nature of the emergency, and the name and dose of the medication   prescribed. In the event that your call is answered by a member of our staff, make sure to document and save the date, time, and the name of the person that took your information.  2. Exception #2 (Planned Surgery): In the event that you are scheduled by another doctor or dentist to have any type of surgery or procedure, you are allowed (for a period no longer than 30 days), to receive additional pain medication, for the acute post-op pain. However, in this case, you are responsible for picking up a copy of our "Post-op Pain Management for Surgeons" handout, and giving it to your surgeon or dentist. This document is available at our office, and does not require an appointment to obtain it. Simply go to our office during business hours (Monday-Thursday from 8:00 AM to 4:00 PM) (Friday 8:00 AM to 12:00 Noon) or if you have a scheduled appointment with [place], prior to your surgery, and ask for it by name. In addition, you will need to provide [place] with your name, name of your surgeon, type of surgery, and date of procedure or surgery.  *Opioid medications include: morphine, codeine, oxycodone, oxymorphone, hydrocodone, hydromorphone, meperidine, tramadol, tapentadol, buprenorphine, fentanyl, methadone. **Benzodiazepine medications include: diazepam (Valium), alprazolam (Xanax), clonazepam (Klonopine), lorazepam (Ativan), clorazepate (Tranxene), chlordiazepoxide (Librium), estazolam (Prosom), oxazepam (Serax), temazepam (Restoril), triazolam (Halcion)  ____________________________________________________________________________________________  BMI Assessment: Estimated body mass index is 44.93 kg/m as calculated from the following:   Height as of this encounter: 5\' 5"  (1.651 m).   Weight as of this encounter: 270 lb (122.5 kg).  BMI interpretation table: BMI level Category Range association with higher incidence of chronic pain   <18 kg/m2 Underweight   18.5-24.9 kg/m2 Ideal body weight   25-29.9 kg/m2 Overweight Increased incidence by 20%  30-34.9 kg/m2 Obese (Class I) Increased incidence by 68%  35-39.9 kg/m2 Severe obesity (Class II) Increased incidence by 136%  >40 kg/m2 Extreme obesity (Class III) Increased incidence by 254%   BMI Readings from Last 4 Encounters:  10/10/16 44.93 kg/m  09/01/16 45.76 kg/m  08/18/16 44.93 kg/m  07/11/16 46.43 kg/m   Wt Readings from Last 4 Encounters:  10/10/16 270 lb (122.5 kg)  09/01/16 275 lb (124.7 kg)  08/18/16 270 lb (122.5 kg)  07/11/16 279 lb (126.6 kg)  Pain Management Discharge Instructions  General Discharge Instructions :  If you need to reach your doctor call: Monday-Friday 8:00 am - 4:00 pm at [phone] or toll free [phone].  After clinic hours [phone] to have operator reach doctor.  Bring all of your medication bottles to all your appointments in the pain clinic.  To cancel or reschedule your appointment with Pain Management please remember to call 24 hours in advance to avoid a fee.  Refer to the educational materials which you have been given on: General Risks, I had my Procedure. Discharge Instructions, Post Sedation.  Post Procedure Instructions:  The drugs you were given will stay in your system until tomorrow, so for the next 24 hours you should not drive, make any legal decisions or drink any alcoholic beverages.  You may eat anything you prefer, but it is better to start with liquids then soups and crackers, and gradually work up to solid foods.  Please notify your doctor immediately if you have any unusual bleeding, trouble breathing or pain that is not related to your normal pain.  Depending on the type of procedure that was done, some parts of your body may feel week and/or numb.  This usually  clears up by tonight or the next day.  Walk with the use of an assistive device or accompanied by an adult for the 24  hours.  You may use ice on the affected area for the first 24 hours.  Put ice in a Ziploc bag and cover with a towel and place against area 15 minutes on 15 minutes off.  You may switch to heat after 24 hours.

## 2016-10-26 ENCOUNTER — Other Ambulatory Visit: Payer: Self-pay | Admitting: Internal Medicine

## 2016-10-26 DIAGNOSIS — Z1231 Encounter for screening mammogram for malignant neoplasm of breast: Secondary | ICD-10-CM

## 2016-11-04 ENCOUNTER — Other Ambulatory Visit: Payer: Self-pay | Admitting: Psychiatry

## 2016-11-11 ENCOUNTER — Other Ambulatory Visit: Payer: Self-pay | Admitting: Psychiatry

## 2016-11-15 ENCOUNTER — Ambulatory Visit (INDEPENDENT_AMBULATORY_CARE_PROVIDER_SITE_OTHER): Payer: Medicare Other | Admitting: Psychiatry

## 2016-11-15 ENCOUNTER — Encounter: Payer: Self-pay | Admitting: Psychiatry

## 2016-11-15 ENCOUNTER — Other Ambulatory Visit: Payer: Self-pay

## 2016-11-15 VITALS — Temp 97.9°F | Wt 283.0 lb

## 2016-11-15 DIAGNOSIS — F3340 Major depressive disorder, recurrent, in remission, unspecified: Secondary | ICD-10-CM

## 2016-11-15 MED ORDER — DULOXETINE HCL 30 MG PO CPEP: ORAL_CAPSULE | ORAL | 2 refills | Status: DC

## 2016-11-15 NOTE — Progress Notes (Signed)
Patient ID: Laura [name], female   DOB: 1945-01-29, 71 y.o.   MRN: [id number]  [facility] MD/PA/NP OP Progress Note  11/15/2016 2:17 PM Laura [name]  MRN:  [id number]  Subjective:  Patient is a 71 year old Caucasian female with a long history of depression and insomnia. Patient reports today that she has been doing quite well. States that because of the rain and the cold her arthritis acts up. She had her infusion the other day. Otherwise taking all her medications regularly and doing quite well. She is expecting another grandchild for her son and is excited. Fair sleep and appetite. Denies any problems.  Visit Diagnosis:     ICD-10-CM   1. Recurrent major depressive disorder, in remission (HCC) F33.40     Past Medical History:  Past Medical History:  Diagnosis Date  . Allergy   . Anxiety   . Arthritis, degenerative 10/08/2013   Overview:    a.  Lumbar spine/spinal stenosis/foot drop.   b.  Hands.   . Chronic kidney disease   . Depression   . 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 Major depression, single episode, in complete remission (HCC) 06/25/2015  . Memory loss, short term 03/19/2014  . Seizure (HCC) 10/07/2014  . Sleep apnea   . Thyroid disease     Past Surgical History:  Procedure Laterality Date  . ABDOMINAL HYSTERECTOMY    . CESAREAN SECTION    . HIP SURGERY Right   . REPLACEMENT TOTAL KNEE Left    Family History:  Family History  Problem Relation Age of Onset  . Hypertension Mother   . Stroke Mother   . Heart attack Father   . Alcohol abuse Father   . Depression Father   . Post-traumatic stress disorder Father   . Rheum arthritis Sister   . Hypertension Sister   . Depression Sister    Social History:  Social History   Socioeconomic History  . Marital status: Married    Spouse name: None  . Number of children: None  . Years of education: None  . Highest education level: None  Social Needs  . Financial resource strain: Not hard  at all  . Food insecurity - worry: Never true  . Food insecurity - inability: Never true  . Transportation needs - medical: No  . Transportation needs - non-medical: No  Occupational History    Comment: retired  Tobacco Use  . Smoking status: Never Smoker  . Smokeless tobacco: Never Used  Substance and Sexual Activity  . Alcohol use: No    Alcohol/week: 0.0 oz  . Drug use: No  . Sexual activity: Not Currently  Other Topics Concern  . None  Social History Narrative  . None   Additional History:   Assessment:   Musculoskeletal: Strength & Muscle Tone: within normal limits Gait & Station: normal Patient leans: N/A  Psychiatric Specialty Exam: Medication Refill     Review of Systems  Psychiatric/Behavioral: Negative for depression, hallucinations, memory loss, substance abuse and suicidal ideas. The patient is not nervous/anxious and does not have insomnia (Avis complaints have improved and she attributes it to  Seroquel).   All other systems reviewed and are negative.    Temperature 97.9 F (36.6 C), temperature source Oral, weight 283 lb (128.4 kg).Body mass index is 47.09 kg/m.  General Appearance: Well Groomed  Eye Contact:  Good  Speech:  Normal Rate  Volume:  Normal  Mood:  Good  Affect:  Congruent  Thought Process:  Linear  and Logical  Orientation:  Full (Time, Place, and Person)  Thought Content:  Negative  Suicidal Thoughts:  No  Homicidal Thoughts:  No  Memory:  Immediate;   Good Recent;   Good Remote;   Good  Judgement:  Good  Insight:  Good  Psychomotor Activity:  Negative  Concentration:  Good  Recall:  Good  Fund of Knowledge: Good  Language: Good  Akathisia:  Negative  Handed:    AIMS (if indicated):  N/A  Assets:  Communication Skills Desire for Improvement Social Support  ADL's:  Intact  Cognition: WNL  Sleep:  fair   Is the patient at risk to self?  No. Has the patient been a risk to self in the past 6 months?  No. Has the  patient been a risk to self within the distant past?  No. Is the patient a risk to others?  No. Has the patient been a risk to others in the past 6 months?  No. Has the patient been a risk to others within the distant past?  No.  Current Medications: Current Outpatient Medications  Medication Sig Dispense Refill  . amLODipine (NORVASC) 10 MG tablet Take 10 mg by mouth 2 (two) times daily.   11  . aspirin EC 81 MG tablet Take by mouth.    . chlorthalidone (HYGROTON) 25 MG tablet TAKE 1/2 TABLET (12.5MG ) BY MOUTH EVERY DAY  11  . Cholecalciferol (VITAMIN D) 2000 UNITS tablet Take by mouth daily.     Marland Kitchen docusate sodium (COLACE) 100 MG capsule Take 100 mg by mouth 2 (two) times daily.    . DULoxetine (CYMBALTA) 30 MG capsule Two tablets in the morning, and one in the evening. 90 capsule 2  . esomeprazole (NEXIUM) 20 MG capsule Take by mouth daily.     . folic acid (FOLVITE) 1 MG tablet Take 1 mg by mouth daily.    . furosemide (LASIX) 40 MG tablet TAKE 1 TABLET ONCE DAILY    . hydroxychloroquine (PLAQUENIL) 200 MG tablet Take 200 mg by mouth daily.     . InFLIXimab-dyyb (INFLECTRA) 100 MG SOLR Inject into the vein every 6 (six) weeks.     Marland Kitchen levothyroxine (SYNTHROID, LEVOTHROID) 200 MCG tablet TAKE 1 TABLET ONCE DAILY- ON AN EMPTY STOMACH WITH A GLASS OF WATER 30-60 MINUTES BEFORE BREAKFAST  5  . levothyroxine (SYNTHROID, LEVOTHROID) 25 MCG tablet TAKE 2 TABLET BY MOUTH EVERY DAY (TAKE WITH 20O MCG TABLET TO EQUAL A 225 MCG DOSE)  5  . levothyroxine (SYNTHROID, LEVOTHROID) 50 MCG tablet TAKE 1 TABLET BY MOUTH ONCE DAILY (WITH 200 MCG TAB FOR 250 MCG TOTAL). 30-60 MIN BEFORE BREAKFAST    . levothyroxine (SYNTHROID, LEVOTHROID) 50 MCG tablet TAKE 1 TABLET BY MOUTH ONCE DAILY (WITH 200 MCG TAB FOR 250 MCG TOTAL). 30-60 MIN BEFORE BREAKFAST  1  . loratadine (CLARITIN) 10 MG tablet Take by mouth daily as needed.     . lubiprostone (AMITIZA) 24 MCG capsule Take 1 capsule (24 mcg total) by mouth 2 (two)  times daily with a meal. Swallow the medication whole. Do not break or chew the medication. 180 capsule 0  . methotrexate (RHEUMATREX) 2.5 MG tablet TAKE 4 TABLETS (10 MG TOTAL) BY MOUTH EVERY 7 (SEVEN) DAYS WITH A MEAL  5  . metoprolol succinate (TOPROL-XL) 50 MG 24 hr tablet TAKE 1 TABLET (50 MG TOTAL) BY MOUTH ONCE DAILY.  1  . Multiple Vitamin (MULTI-VITAMINS) TABS Take by mouth.    Marland Kitchen  oxyCODONE (OXY IR/ROXICODONE) 5 MG immediate release tablet Take 1 tablet (5 mg total) by mouth every 6 (six) hours as needed for severe pain. 120 tablet 0  . [START ON 11/21/2016] oxyCODONE (OXY IR/ROXICODONE) 5 MG immediate release tablet Take 1 tablet (5 mg total) by mouth every 6 (six) hours as needed for severe pain. 120 tablet 0  . [START ON 12/21/2016] oxyCODONE (OXY IR/ROXICODONE) 5 MG immediate release tablet Take 1 tablet (5 mg total) by mouth every 6 (six) hours as needed for severe pain. 120 tablet 0  . QUEtiapine (SEROQUEL) 200 MG tablet TAKE 1 TABLET BY MOUTH AT BEDTIME 90 tablet 0  . QUEtiapine (SEROQUEL) 50 MG tablet TAKE 1 TABLET BY MOUTH TWICE A DAY 180 tablet 0  . spironolactone (ALDACTONE) 25 MG tablet TAKE 1/2 (ONE-HALF) TABLET BY MOUTH DAILY  11  . traMADol (ULTRAM) 50 MG tablet TAKE ONE TABLET BY MOUTH EVERY EIGHT HOURS AS NEEDED FOR PAIN FOR UP TO FIVE DAYS.  3   No current facility-administered medications for this visit.     Medical Decision Making:  Established Problem, Stable/Improving (1), Review of Medication Regimen & Side Effects (2) and Review of New Medication or Change in Dosage (2)  Treatment Plan Summary:Medication management and Plan    Major depressive disorder, recurrent, moderate-patient will continue her Cymbalta 60 mg in the morning and 30 mg in the evening.   Insomnia- Continue Seroquel at 200 mg at bedtime.   Anxiety- take Seroquel at 50 mg twice daily. If she gets too sedated in the morning to shift change that to bedtime.  RTC to clinic in 3 months time or  call before if necessary.  Roper Tolson 11/15/2016, 2:17 PM

## 2016-12-08 ENCOUNTER — Ambulatory Visit
Admission: RE | Admit: 2016-12-08 | Discharge: 2016-12-08 | Disposition: A | Discharge status: Home / Self Care | Payer: Medicare Other | Source: Ambulatory Visit | Attending: Internal Medicine | Admitting: Internal Medicine

## 2016-12-08 DIAGNOSIS — Z1231 Encounter for screening mammogram for malignant neoplasm of breast: Secondary | ICD-10-CM | POA: Insufficient documentation

## 2016-12-08 IMAGING — MG MM DIGITAL SCREENING BILAT W/ TOMO W/ CAD
8 of 15 series · 8 of 31 positions shown · non-contrast
Comparison: Previous exam(s).

CLINICAL DATA: Screening.

EXAM:
2D DIGITAL SCREENING BILATERAL MAMMOGRAM WITH CAD AND ADJUNCT TOMO

[R MLO (1 of 2)]
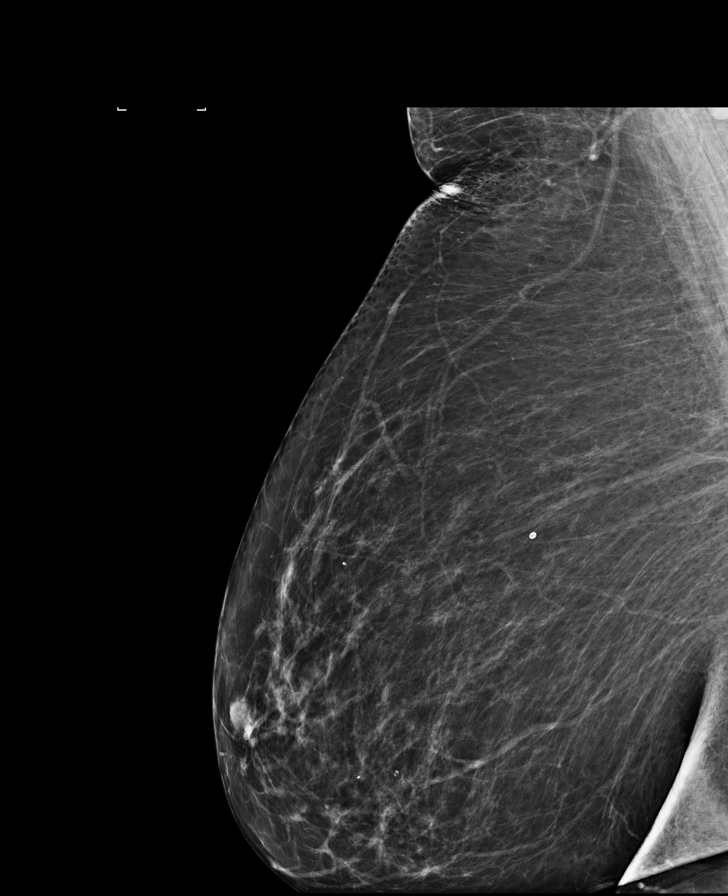

[L XCCL]
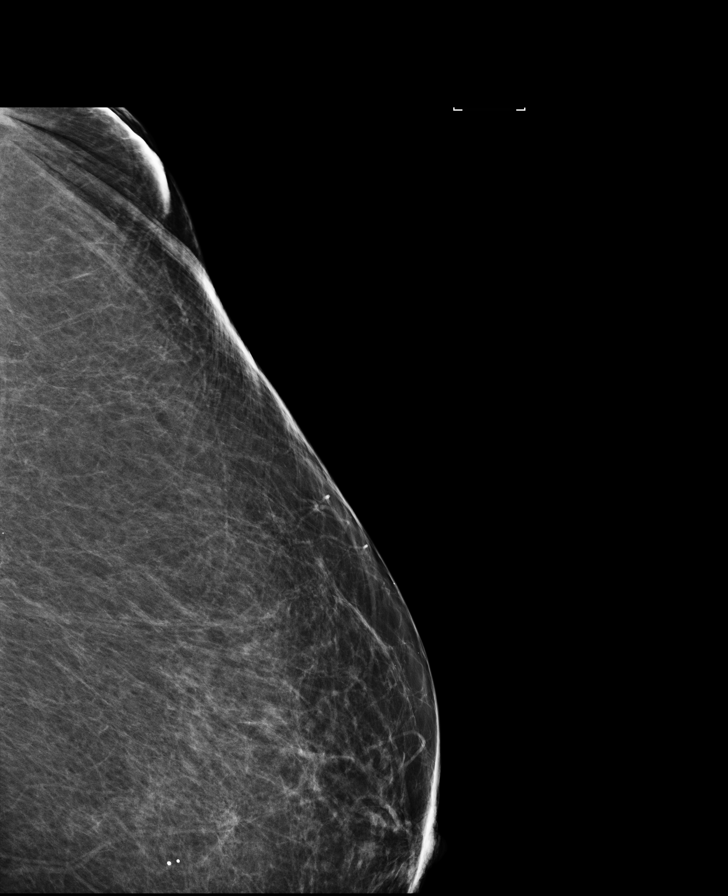

[R XCCL]
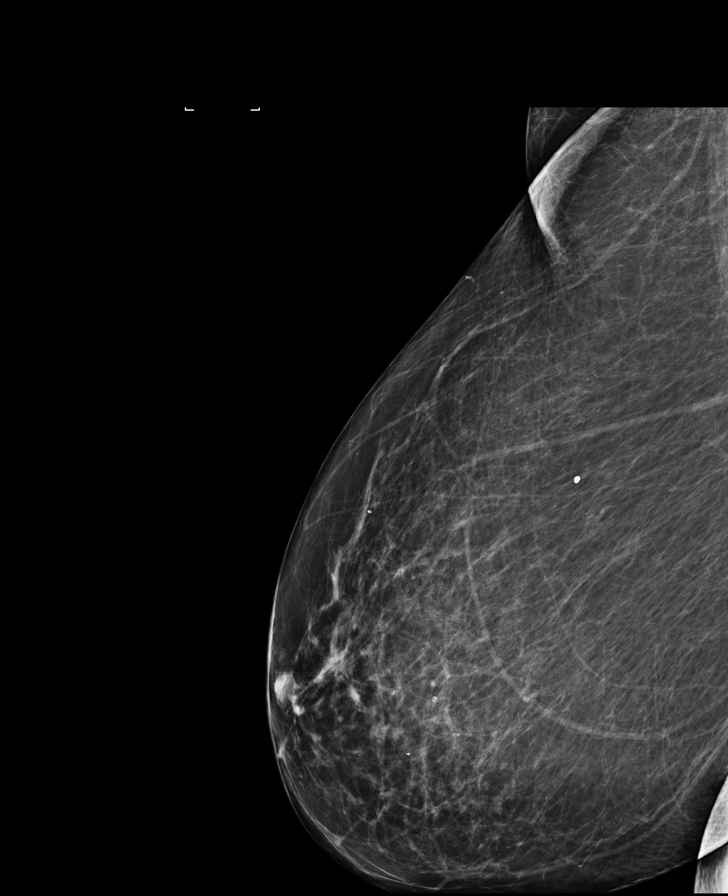

[L MLO synth-2D]
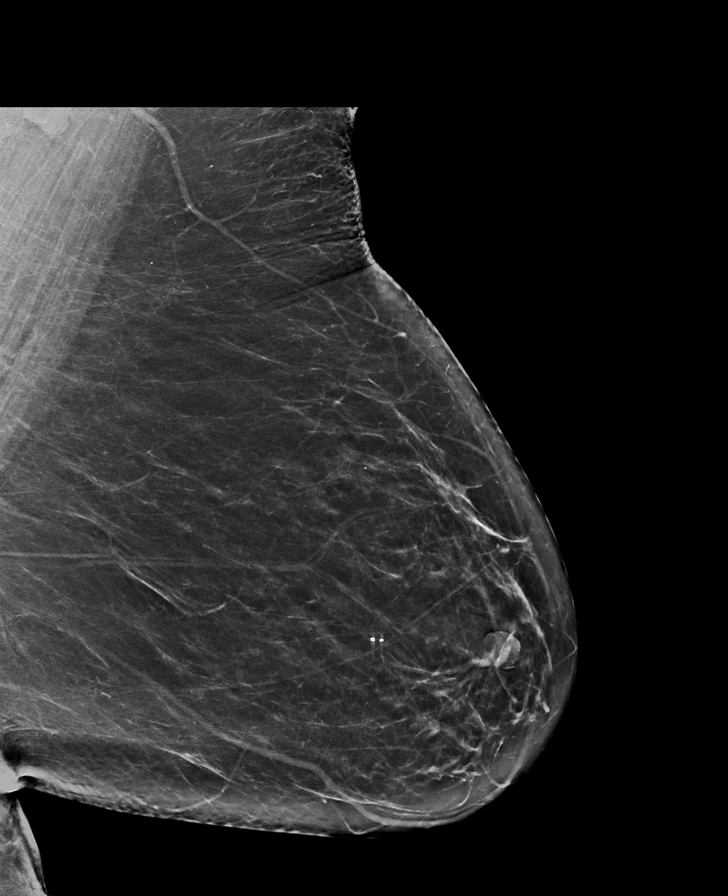

[L CC synth-2D]
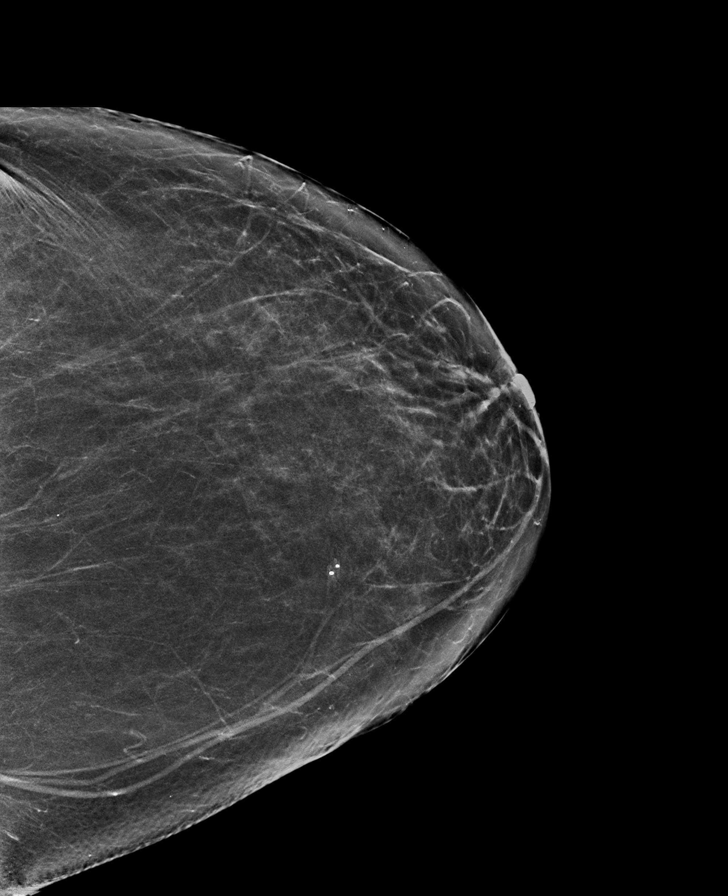

[L CC]
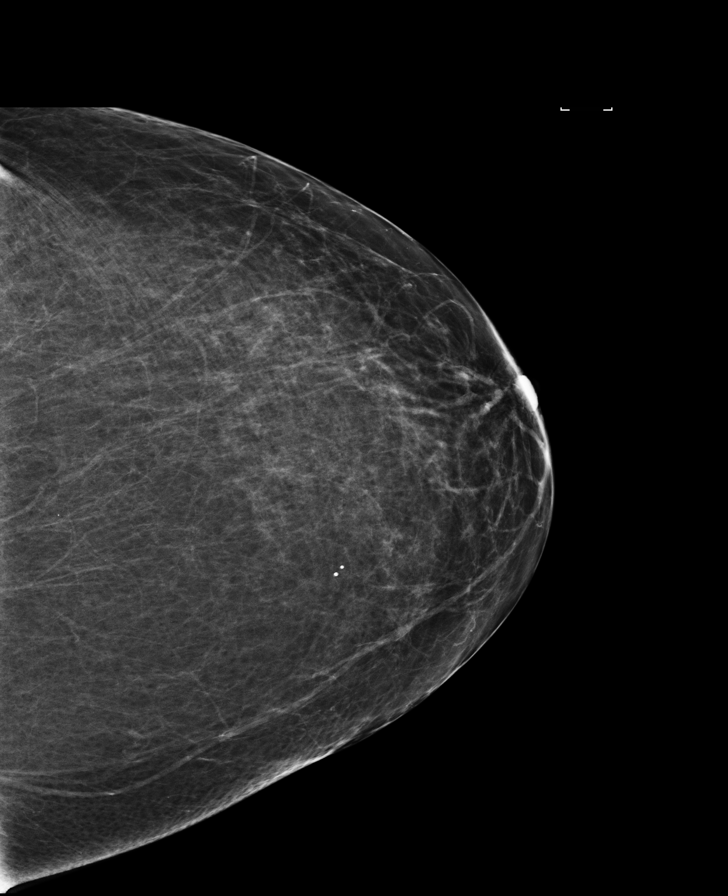

[R MLO (2 of 2)]
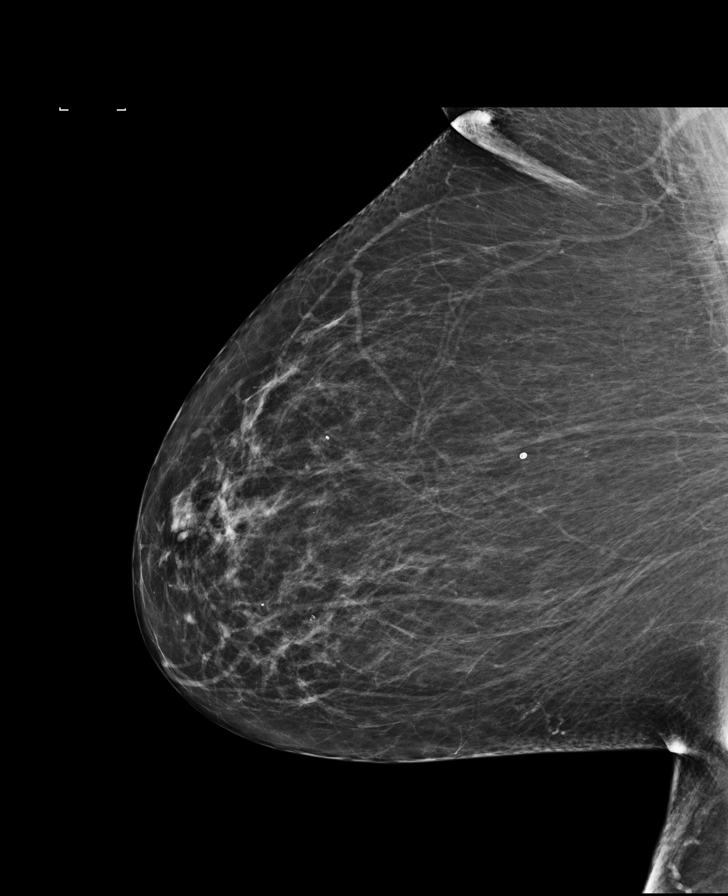

[L MLO]
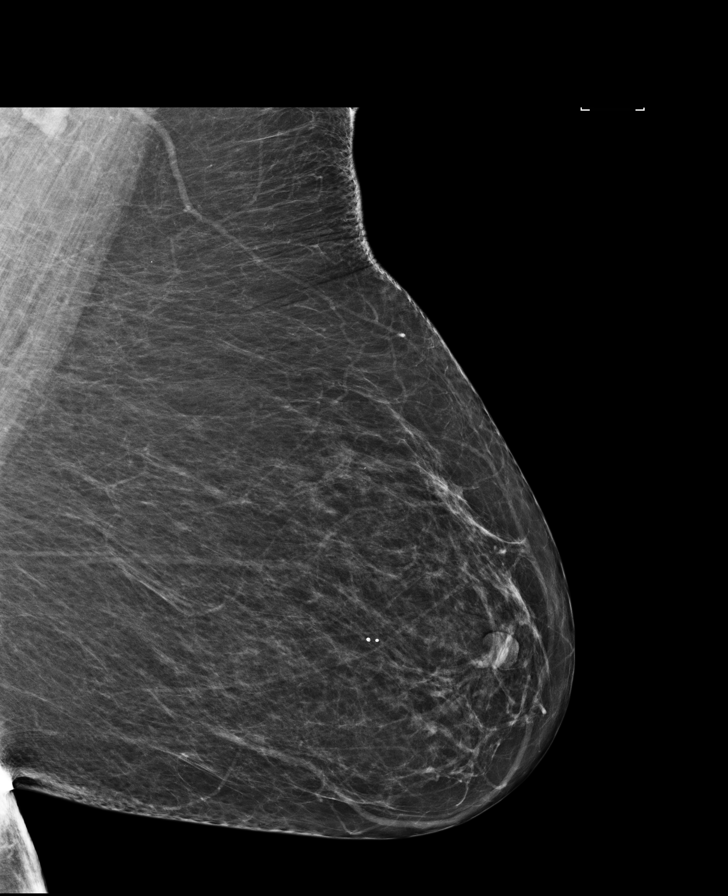

[8 of 31 positions shown; findings below may reference images not displayed]

ACR Breast Density Category b: There are scattered areas of
fibroglandular density.
FINDINGS: There are no findings suspicious for malignancy. Images were
processed with CAD.
IMPRESSION: No mammographic evidence of malignancy. A result letter of this
screening mammogram will be mailed directly to the patient.

RECOMMENDATION:
Screening mammogram in one year. (Code:[33])

BI-RADS CATEGORY  1: Negative.

## 2016-12-28 ENCOUNTER — Ambulatory Visit: Payer: Medicare Other | Attending: Nurse Practitioner | Admitting: Nurse Practitioner

## 2016-12-28 ENCOUNTER — Encounter: Payer: Self-pay | Admitting: Nurse Practitioner

## 2016-12-28 ENCOUNTER — Other Ambulatory Visit: Payer: Self-pay

## 2016-12-28 VITALS — BP 144/69 | HR 85 | Resp 18 | Ht 65.0 in | Wt 280.0 lb

## 2016-12-28 DIAGNOSIS — M160 Bilateral primary osteoarthritis of hip: Secondary | ICD-10-CM | POA: Insufficient documentation

## 2016-12-28 DIAGNOSIS — I509 Heart failure, unspecified: Secondary | ICD-10-CM | POA: Insufficient documentation

## 2016-12-28 DIAGNOSIS — Z79899 Other long term (current) drug therapy: Secondary | ICD-10-CM | POA: Diagnosis not present

## 2016-12-28 DIAGNOSIS — Z5181 Encounter for therapeutic drug level monitoring: Secondary | ICD-10-CM | POA: Diagnosis not present

## 2016-12-28 DIAGNOSIS — E1122 Type 2 diabetes mellitus with diabetic chronic kidney disease: Secondary | ICD-10-CM | POA: Insufficient documentation

## 2016-12-28 DIAGNOSIS — Z79891 Long term (current) use of opiate analgesic: Secondary | ICD-10-CM | POA: Insufficient documentation

## 2016-12-28 DIAGNOSIS — E7800 Pure hypercholesterolemia, unspecified: Secondary | ICD-10-CM | POA: Diagnosis not present

## 2016-12-28 DIAGNOSIS — M9983 Other biomechanical lesions of lumbar region: Secondary | ICD-10-CM

## 2016-12-28 DIAGNOSIS — M4807 Spinal stenosis, lumbosacral region: Secondary | ICD-10-CM | POA: Diagnosis not present

## 2016-12-28 DIAGNOSIS — Z8261 Family history of arthritis: Secondary | ICD-10-CM | POA: Insufficient documentation

## 2016-12-28 DIAGNOSIS — M47816 Spondylosis without myelopathy or radiculopathy, lumbar region: Secondary | ICD-10-CM | POA: Diagnosis not present

## 2016-12-28 DIAGNOSIS — Z811 Family history of alcohol abuse and dependence: Secondary | ICD-10-CM | POA: Insufficient documentation

## 2016-12-28 DIAGNOSIS — Z823 Family history of stroke: Secondary | ICD-10-CM | POA: Insufficient documentation

## 2016-12-28 DIAGNOSIS — Z7982 Long term (current) use of aspirin: Secondary | ICD-10-CM | POA: Diagnosis not present

## 2016-12-28 DIAGNOSIS — Z96652 Presence of left artificial knee joint: Secondary | ICD-10-CM | POA: Diagnosis not present

## 2016-12-28 DIAGNOSIS — G4730 Sleep apnea, unspecified: Secondary | ICD-10-CM | POA: Diagnosis not present

## 2016-12-28 DIAGNOSIS — N183 Chronic kidney disease, stage 3 (moderate): Secondary | ICD-10-CM | POA: Insufficient documentation

## 2016-12-28 DIAGNOSIS — F419 Anxiety disorder, unspecified: Secondary | ICD-10-CM | POA: Insufficient documentation

## 2016-12-28 DIAGNOSIS — Z888 Allergy status to other drugs, medicaments and biological substances status: Secondary | ICD-10-CM | POA: Insufficient documentation

## 2016-12-28 DIAGNOSIS — Z880 Allergy status to penicillin: Secondary | ICD-10-CM | POA: Insufficient documentation

## 2016-12-28 DIAGNOSIS — G894 Chronic pain syndrome: Secondary | ICD-10-CM

## 2016-12-28 DIAGNOSIS — M48062 Spinal stenosis, lumbar region with neurogenic claudication: Secondary | ICD-10-CM | POA: Diagnosis not present

## 2016-12-28 DIAGNOSIS — G8929 Other chronic pain: Secondary | ICD-10-CM | POA: Diagnosis not present

## 2016-12-28 DIAGNOSIS — M25561 Pain in right knee: Secondary | ICD-10-CM

## 2016-12-28 DIAGNOSIS — R413 Other amnesia: Secondary | ICD-10-CM | POA: Diagnosis not present

## 2016-12-28 DIAGNOSIS — M25552 Pain in left hip: Secondary | ICD-10-CM | POA: Insufficient documentation

## 2016-12-28 DIAGNOSIS — M488X6 Other specified spondylopathies, lumbar region: Secondary | ICD-10-CM | POA: Insufficient documentation

## 2016-12-28 DIAGNOSIS — Z6841 Body Mass Index (BMI) 40.0 and over, adult: Secondary | ICD-10-CM | POA: Insufficient documentation

## 2016-12-28 DIAGNOSIS — K5903 Drug induced constipation: Secondary | ICD-10-CM | POA: Insufficient documentation

## 2016-12-28 DIAGNOSIS — F329 Major depressive disorder, single episode, unspecified: Secondary | ICD-10-CM | POA: Diagnosis not present

## 2016-12-28 DIAGNOSIS — Z8249 Family history of ischemic heart disease and other diseases of the circulatory system: Secondary | ICD-10-CM | POA: Insufficient documentation

## 2016-12-28 DIAGNOSIS — G4700 Insomnia, unspecified: Secondary | ICD-10-CM | POA: Insufficient documentation

## 2016-12-28 DIAGNOSIS — Z818 Family history of other mental and behavioral disorders: Secondary | ICD-10-CM | POA: Insufficient documentation

## 2016-12-28 DIAGNOSIS — M797 Fibromyalgia: Secondary | ICD-10-CM | POA: Insufficient documentation

## 2016-12-28 DIAGNOSIS — R7982 Elevated C-reactive protein (CRP): Secondary | ICD-10-CM | POA: Insufficient documentation

## 2016-12-28 DIAGNOSIS — E039 Hypothyroidism, unspecified: Secondary | ICD-10-CM | POA: Diagnosis not present

## 2016-12-28 DIAGNOSIS — R209 Unspecified disturbances of skin sensation: Secondary | ICD-10-CM | POA: Insufficient documentation

## 2016-12-28 DIAGNOSIS — T402X5A Adverse effect of other opioids, initial encounter: Secondary | ICD-10-CM | POA: Insufficient documentation

## 2016-12-28 DIAGNOSIS — Z79890 Hormone replacement therapy (postmenopausal): Secondary | ICD-10-CM | POA: Insufficient documentation

## 2016-12-28 DIAGNOSIS — M0600 Rheumatoid arthritis without rheumatoid factor, unspecified site: Secondary | ICD-10-CM | POA: Diagnosis not present

## 2016-12-28 DIAGNOSIS — K219 Gastro-esophageal reflux disease without esophagitis: Secondary | ICD-10-CM | POA: Insufficient documentation

## 2016-12-28 DIAGNOSIS — M48061 Spinal stenosis, lumbar region without neurogenic claudication: Secondary | ICD-10-CM

## 2016-12-28 DIAGNOSIS — I130 Hypertensive heart and chronic kidney disease with heart failure and stage 1 through stage 4 chronic kidney disease, or unspecified chronic kidney disease: Secondary | ICD-10-CM | POA: Insufficient documentation

## 2016-12-28 DIAGNOSIS — F411 Generalized anxiety disorder: Secondary | ICD-10-CM | POA: Insufficient documentation

## 2016-12-28 MED ORDER — OXYCODONE HCL 5 MG PO TABS: 5.0000 mg | ORAL_TABLET | Freq: Four times a day (QID) | ORAL | 0 refills | Status: DC | PRN

## 2016-12-28 NOTE — Progress Notes (Signed)
[name]  MRN: [id number]  Referring Provider: Glendon Axe, MD  DOB: 06/09/1945  PCP: Glendon Axe, MD  DOS: 12/28/2016  Note by: Vevelyn Francois NP  Service setting: Ambulatory outpatient  Specialty: Interventional Pain Management  Location: [facility] (AMB) Pain Management Facility    Patient type: Established    Primary Reason(s) for Visit: Encounter for prescription drug management. (Level of risk: moderate)  CC: Knee Pain (right)  HPI  Laura [name] is a 71 y.o. year old, female patient, who comes today for a medication management evaluation. She has Chronic kidney disease (CKD), stage III (moderate) ([place]); Depressive disorder; Essential (primary) hypertension; GERD (gastroesophageal reflux disease); Adult hypothyroidism; Hypercholesterolemia; Extreme obesity ([place]); Insomnia secondary to chronic pain; Congestive heart failure with left ventricular systolic dysfunction ([place]); Type 2 diabetes mellitus ([place]); Morbid obesity ([place]); Seronegative rheumatoid arthritis ([place]); Chronic knee pain (Right); Opiate use (30 MME/Day); Long term current use of opiate analgesic; Long term prescription opiate use; Encounter for therapeutic drug level monitoring; Opioid dependence ([place]); Lumbar spinal stenosis (5 mm Severe L3-4; 8 mm L4-5); Lumbar spondylosis; Lumbar facet syndrome (Location of Primary Source of Pain) (Bilateral) (L>R); Chronic pain syndrome; Chronic low back pain (Location of Primary Source of Pain) (Bilateral) (L>R); History of TKR (total knee replacement) (Left); History of femur fracture (Right); 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 Her primarily concern today is the Knee Pain (right)  Pain Assessment: Location: Right Knee Radiating: radiates to up to right hip and down right leg Onset: More than a month ago Duration: Chronic pain Quality: Aching, Constant Severity: 3 /10 (self-reported pain score)  Note: Reported level is compatible with observation.                          Timing: Constant Modifying factors: patches, meds  Laura [name] was last scheduled for an appointment on 10/10/2016 for medication management. During today's appointment we reviewed Laura [name]'s chronic pain status, as well as her outpatient medication regimen. She states that she is doing well, her pain is stable  The patient  reports that she does not use drugs. Her body mass index is 46.59 kg/m.  Further details on both, my assessment(s), as well as the proposed treatment plan, please see below.  Controlled Substance Pharmacotherapy Assessment REMS (Risk Evaluation and Mitigation Strategy)  Analgesic:Oxycodone IR 5 mg every 6 hours (20 mg/day) MME/day:30 mg/day Chauncey Fischer, RN  12/28/2016  2:00 PM  Sign at close encounter Nursing Pain Medication Assessment:  Safety precautions to be maintained throughout the outpatient stay will include: orient to surroundings, keep bed in low position, maintain call bell within reach at all times, provide assistance with transfer out of bed and ambulation.  Medication Inspection Compliance: Pill count conducted under aseptic conditions, in front of the patient. Neither the pills nor the bottle was removed from the patient's sight at any time. Once count was completed pills were immediately returned to the patient in their original bottle.  Medication:  Oxycodone IR Pill/Patch Count: 92 of 120 pills remain  Pill/Patch Appearance: Markings consistent with prescribed medication Bottle Appearance: Standard pharmacy container. Clearly labeled. Filled Date: 12 /12 / 2018 Last Medication intake:  Yesterday   Pharmacokinetics: Liberation and absorption (onset of action): WNL Distribution (time to peak effect): WNL Metabolism and excretion (duration of action): WNL         Pharmacodynamics: Desired effects: Analgesia: Laura [name] reports >50% benefit. Functional ability: Patient reports that medication allows her to accomplish basic ADLs Clinically meaningful improvement in function (CMIF): Sustained CMIF goals met Perceived effectiveness: Described as relatively effective, allowing for increase in activities of daily living (ADL) Undesirable effects: Side-effects or Adverse reactions: None reported Monitoring: [place] PMP: Online review of the past 63-monthperiod conducted. Compliant with practice rules and regulations Last UDS on record: Summary  Date Value Ref Range Status  07/11/2016 FINAL  Final    Comment:    ==================================================================== TOXASSURE SELECT 13 (MW) ==================================================================== Test                             Result       Flag       Units Drug Present and Declared for Prescription Verification   Oxycodone                      575          EXPECTED   ng/mg creat   Oxymorphone                    226          EXPECTED   ng/mg creat   Noroxycodone                   1580         EXPECTED   ng/mg creat   Noroxymorphone                 237          EXPECTED   ng/mg creat    Sources of oxycodone are scheduled prescription medications.    Oxymorphone, noroxycodone, and noroxymorphone are expected    metabolites of oxycodone. Oxymorphone is also available as a    scheduled prescription medication.   Tramadol                       PRESENT       EXPECTED   O-Desmethyltramadol            PRESENT      EXPECTED   N-Desmethyltramadol            PRESENT      EXPECTED    Source of tramadol is a prescription medication.    O-desmethyltramadol and N-desmethyltramadol are expected    metabolites of tramadol. ==================================================================== Test                      Result    Flag   Units      Ref Range   Creatinine              168              mg/dL      >=20 ==================================================================== Declared Medications:  The flagging and interpretation on this report are based on the  following declared medications.  Unexpected results may arise from  inaccuracies in the declared medications.  **Note: The testing scope of  this panel includes these medications:  Oxycodone  Tramadol  **Note: The testing scope of this panel does not include following  reported medications:  Amlodipine (Norvasc)  Aspirin (Aspirin 81)  Chlorthalidone  Docusate (Colace)  Duloxetine (Cymbalta)  Folic acid (Folvite)  Furosemide (Lasix)  Hydroxychloroquine (Plaquenil)  Infliximab  Levothyroxine  Loratadine (Claritin)  Lubiprostone (Amitiza)  Methotrexate  Metoprolol  Multivitamin  Omeprazole (Nexium)  Quetiapine (Seroquel)  Spironolactone (Aldactone)  Vitamin D ==================================================================== For clinical consultation, please call [phone]. ====================================================================    UDS interpretation: Compliant          Medication Assessment Form: Reviewed. Patient indicates being compliant with therapy Treatment compliance: Compliant Risk Assessment Profile: Aberrant behavior: See prior evaluations. None observed or detected today Comorbid factors increasing risk of overdose: See prior notes. No additional risks detected today Risk of substance use disorder (SUD): Low Opioid Risk Tool - 12/28/16 1355       Family History of Substance Abuse   Alcohol  Positive Female    Illegal Drugs  Negative    Rx Drugs  Negative      Personal History of Substance Abuse   Alcohol  Negative    Illegal Drugs  Negative    Rx Drugs  Negative      Age   Age between 18-45 years   No      History of Preadolescent Sexual Abuse   History of Preadolescent Sexual Abuse  Negative or Female      Psychological Disease   Psychological Disease  Negative    Depression  Negative      Total Score   Opioid Risk Tool Scoring  1    Opioid Risk Interpretation  Low Risk      ORT Scoring interpretation table:  Score <3 = Low Risk for SUD  Score between 4-7 = Moderate Risk for SUD  Score >8 = High Risk for Opioid Abuse   Risk Mitigation Strategies:  Patient Counseling: Covered Patient-Prescriber Agreement (PPA): Present and active  Notification to other healthcare providers: Done  Pharmacologic Plan: No change in therapy, at this time  Laboratory Chemistry  Inflammation Markers (CRP: Acute Phase) (ESR: Chronic Phase) Lab Results  Component Value Date   CRP 1.9 (H) 07/15/2015   ESRSEDRATE 32 (H) 07/15/2015                 Rheumatology Markers No results found for: RF, ANA, LABURIC, URICUR, LYMEIGGIGMAB, LYMEABIGMQN              Renal Function Markers Lab Results  Component Value Date   BUN 21 (H) 07/15/2015   CREATININE 1.22 (H) 07/15/2015   GFRAA 51 (L) 07/15/2015   GFRNONAA 44 (L) 07/15/2015                 Hepatic Function Markers Lab Results  Component Value Date   AST 19 07/15/2015   ALT 15 07/15/2015   ALBUMIN 4.0 07/15/2015   ALKPHOS 73 07/15/2015                 Electrolytes Lab Results  Component Value Date   NA 140 07/15/2015   K 3.7 07/15/2015   CL 101 07/15/2015   CALCIUM 9.1 07/15/2015   MG 2.1 07/15/2015                 Neuropathy Markers Lab Results  Component Value Date   VITAMINB12 610 07/15/2015  Bone Pathology Markers Lab Results  Component  Value Date   25OHVITD1 36 07/15/2015   25OHVITD2 3.6 07/15/2015   25OHVITD3 32 07/15/2015                 Coagulation Parameters Lab Results  Component Value Date   PLT 251 06/12/2013                 Cardiovascular Markers Lab Results  Component Value Date   TROPONINI < 0.02 06/12/2013   HGB 13.6 06/12/2013   HCT 41.8 06/12/2013                 CA Markers No results found for: CEA, CA125, LABCA2               Note: Lab results reviewed.  Recent Diagnostic Imaging Results  MM SCREENING BREAST TOMO BILATERAL CLINICAL DATA:  Screening.  EXAM: 2D DIGITAL SCREENING BILATERAL MAMMOGRAM WITH CAD AND ADJUNCT TOMO  COMPARISON:  Previous exam(s).  ACR Breast Density Category b: There are scattered areas of fibroglandular density.  FINDINGS: There are no findings suspicious for malignancy. Images were processed with CAD.  IMPRESSION: No mammographic evidence of malignancy. A result letter of this screening mammogram will be mailed directly to the patient.  RECOMMENDATION: Screening mammogram in one year. (Code:[id number])  BI-RADS CATEGORY  1: Negative.  Electronically Signed   By: Ammie Ferrier M.D.   On: 12/09/2016 10:03  Complexity Note: Imaging results reviewed. Results shared with Laura [name], using Layman's terms.                         Meds   Current Outpatient Medications:  .  amLODipine (NORVASC) 10 MG tablet, Take 10 mg by mouth 2 (two) times daily. , Disp: , Rfl: 11 .  aspirin EC 81 MG tablet, Take by mouth., Disp: , Rfl:  .  chlorthalidone (HYGROTON) 25 MG tablet, TAKE 1/2 TABLET (12.5MG) BY MOUTH EVERY DAY, Disp: , Rfl: 11 .  Cholecalciferol (VITAMIN D) 2000 UNITS tablet, Take by mouth daily. , Disp: , Rfl:  .  docusate sodium (COLACE) 100 MG capsule, Take 100 mg by mouth 2 (two) times daily., Disp: , Rfl:  .  DULoxetine (CYMBALTA) 30 MG capsule, Two tablets in the morning, and one in the evening., Disp: 90 capsule, Rfl: 2 .  esomeprazole  (NEXIUM) 20 MG capsule, Take by mouth daily. , Disp: , Rfl:  .  folic acid (FOLVITE) 1 MG tablet, Take 1 mg by mouth daily., Disp: , Rfl:  .  furosemide (LASIX) 40 MG tablet, TAKE 1 TABLET ONCE DAILY, Disp: , Rfl:  .  hydroxychloroquine (PLAQUENIL) 200 MG tablet, Take 200 mg by mouth daily. , Disp: , Rfl:  .  InFLIXimab-dyyb (INFLECTRA) 100 MG SOLR, Inject into the vein every 6 (six) weeks. , Disp: , Rfl:  .  levothyroxine (SYNTHROID, LEVOTHROID) 200 MCG tablet, TAKE 1 TABLET ONCE DAILY- ON AN EMPTY STOMACH WITH A GLASS OF WATER 30-60 MINUTES BEFORE BREAKFAST, Disp: , Rfl: 5 .  levothyroxine (SYNTHROID, LEVOTHROID) 25 MCG tablet, TAKE 2 TABLET BY MOUTH EVERY DAY (TAKE WITH 20O MCG TABLET TO EQUAL A 225 MCG DOSE), Disp: , Rfl: 5 .  levothyroxine (SYNTHROID, LEVOTHROID) 50 MCG tablet, TAKE 1 TABLET BY MOUTH ONCE DAILY (WITH 200 MCG TAB FOR 250 MCG TOTAL). 30-60 MIN BEFORE BREAKFAST, Disp: , Rfl:  .  levothyroxine (SYNTHROID, LEVOTHROID) 50 MCG tablet, TAKE 1 TABLET BY MOUTH ONCE DAILY (  WITH 200 MCG TAB FOR 250 MCG TOTAL). 30-60 MIN BEFORE BREAKFAST, Disp: , Rfl: 1 .  loratadine (CLARITIN) 10 MG tablet, Take by mouth daily as needed. , Disp: , Rfl:  .  lubiprostone (AMITIZA) 24 MCG capsule, Take 1 capsule (24 mcg total) by mouth 2 (two) times daily with a meal. Swallow the medication whole. Do not break or chew the medication., Disp: 180 capsule, Rfl: 0 .  methotrexate (RHEUMATREX) 2.5 MG tablet, TAKE 4 TABLETS (10 MG TOTAL) BY MOUTH EVERY 7 (SEVEN) DAYS WITH A MEAL, Disp: , Rfl: 5 .  metoprolol succinate (TOPROL-XL) 50 MG 24 hr tablet, TAKE 1 TABLET (50 MG TOTAL) BY MOUTH ONCE DAILY., Disp: , Rfl: 1 .  Multiple Vitamin (MULTI-VITAMINS) TABS, Take by mouth., Disp: , Rfl:  .  [START ON 03/21/2017] oxyCODONE (OXY IR/ROXICODONE) 5 MG immediate release tablet, Take 1 tablet (5 mg total) by mouth every 6 (six) hours as needed for severe pain., Disp: 120 tablet, Rfl: 0 .  QUEtiapine (SEROQUEL) 200 MG tablet,  TAKE 1 TABLET BY MOUTH AT BEDTIME, Disp: 90 tablet, Rfl: 0 .  QUEtiapine (SEROQUEL) 50 MG tablet, TAKE 1 TABLET BY MOUTH TWICE A DAY, Disp: 180 tablet, Rfl: 0 .  spironolactone (ALDACTONE) 25 MG tablet, TAKE 1/2 (ONE-HALF) TABLET BY MOUTH DAILY, Disp: , Rfl: 11 .  [START ON 02/19/2017] oxyCODONE (OXY IR/ROXICODONE) 5 MG immediate release tablet, Take 1 tablet (5 mg total) by mouth every 6 (six) hours as needed for severe pain., Disp: 120 tablet, Rfl: 0 .  [START ON 01/20/2017] oxyCODONE (OXY IR/ROXICODONE) 5 MG immediate release tablet, Take 1 tablet (5 mg total) by mouth every 6 (six) hours as needed for severe pain., Disp: 120 tablet, Rfl: 0 .  traMADol (ULTRAM) 50 MG tablet, TAKE ONE TABLET BY MOUTH EVERY EIGHT HOURS AS NEEDED FOR PAIN FOR UP TO FIVE DAYS., Disp: , Rfl: 3  ROS  Constitutional: Denies any fever or chills Gastrointestinal: No reported hemesis, hematochezia, vomiting, or acute GI distress Musculoskeletal: Denies any acute onset joint swelling, redness, loss of ROM, or weakness Neurological: No reported episodes of acute onset apraxia, aphasia, dysarthria, agnosia, amnesia, paralysis, loss of coordination, or loss of consciousness  Allergies  Laura [name] is allergic to bupropion and penicillins.  PFSH  Drug: Laura [name]  reports that she does not use drugs. Alcohol:  reports that she does not drink alcohol. Tobacco:  reports that  has never smoked. she has never used smokeless tobacco. Medical:  has a past medical history of Allergy, Anxiety, Arthritis, degenerative (10/08/2013), Chronic kidney disease, Depression, Lumbar spinal stenosis with neurogenic claudication (11/07/2014), Major depression, single episode, in complete remission ([place]) (06/25/2015), Memory loss, short term (03/19/2014), Seizure ([place]) (10/07/2014), Sleep apnea, and Thyroid disease. Surgical: Laura [name]  has a past surgical history that includes Abdominal hysterectomy; Cesarean section; Replacement total knee  (Left); and Hip surgery (Right). Family: family history includes Alcohol abuse in her father; Depression in her father and sister; Heart attack in her father; Hypertension in her mother and sister; Post-traumatic stress disorder in her father; Rheum arthritis in her sister; Stroke in her mother.  Constitutional Exam  General appearance: Well nourished, well developed, and well hydrated. In no apparent acute distress Vitals:   12/28/16 1336  BP: (!) 144/69  Pulse: 85  Resp: 18  Weight: 280 lb (127 kg)  Height: '5\' 5"'  (1.651 m)   BMI Assessment: Estimated body mass index is 46.59 kg/m as calculated from the following:  Height as of this encounter: '5\' 5"'  (1.651 m).   Weight as of this encounter: 280 lb (127 kg). Psych/Mental status: Alert, oriented x 3 (person, place, & time)       Eyes: PERLA Respiratory: No evidence of acute respiratory distress  Cervical Spine Area Exam  Skin & Axial Inspection: No masses, redness, edema, swelling, or associated skin lesions Alignment: Symmetrical Functional ROM: Unrestricted ROM      Stability: No instability detected Muscle Tone/Strength: Functionally intact. No obvious neuro-muscular anomalies detected. Sensory (Neurological): Unimpaired Palpation: No palpable anomalies              Upper Extremity (UE) Exam    Side: Right upper extremity  Side: Left upper extremity  Skin & Extremity Inspection: Skin color, temperature, and hair growth are WNL. No peripheral edema or cyanosis. No masses, redness, swelling, asymmetry, or associated skin lesions. No contractures.  Skin & Extremity Inspection: Skin color, temperature, and hair growth are WNL. No peripheral edema or cyanosis. No masses, redness, swelling, asymmetry, or associated skin lesions. No contractures.  Functional ROM: Unrestricted ROM          Functional ROM: Unrestricted ROM          Muscle Tone/Strength: Functionally intact. No obvious neuro-muscular anomalies detected.  Muscle  Tone/Strength: Functionally intact. No obvious neuro-muscular anomalies detected.  Sensory (Neurological): Unimpaired          Sensory (Neurological): Unimpaired          Palpation: No palpable anomalies              Palpation: No palpable anomalies              Specialized Test(s): Deferred         Specialized Test(s): Deferred          Thoracic Spine Area Exam  Skin & Axial Inspection: No masses, redness, or swelling Alignment: Symmetrical Functional ROM: Unrestricted ROM Stability: No instability detected Muscle Tone/Strength: Functionally intact. No obvious neuro-muscular anomalies detected. Sensory (Neurological): Unimpaired Muscle strength & Tone: No palpable anomalies  Lumbar Spine Area Exam  Skin & Axial Inspection: No masses, redness, or swelling Alignment: Symmetrical Functional ROM: Unrestricted ROM      Stability: No instability detected Muscle Tone/Strength: Functionally intact. No obvious neuro-muscular anomalies detected. Sensory (Neurological): Unimpaired Palpation: No palpable anomalies       Provocative Tests: Lumbar Hyperextension and rotation test: evaluation deferred today       Lumbar Lateral bending test: evaluation deferred today       Patrick's Maneuver: evaluation deferred today                    Gait & Posture Assessment  Ambulation: Patient ambulates using a walker Gait: Relatively normal for age and body habitus Posture: WNL   Lower Extremity Exam    Side: Right lower extremity  Side: Left lower extremity  Skin & Extremity Inspection: Skin color, temperature, and hair growth are WNL. No peripheral edema or cyanosis. No masses, redness, swelling, asymmetry, or associated skin lesions. No contractures.  Skin & Extremity Inspection: Skin color, temperature, and hair growth are WNL. No peripheral edema or cyanosis. No masses, redness, swelling, asymmetry, or associated skin lesions. No contractures.  Functional ROM: Unrestricted ROM          Functional  ROM: Unrestricted ROM          Muscle Tone/Strength: Functionally intact. No obvious neuro-muscular anomalies detected.  Muscle Tone/Strength: Functionally intact. No obvious neuro-muscular  anomalies detected.  Sensory (Neurological): Unimpaired  Sensory (Neurological): Unimpaired  Palpation: No palpable anomalies  Palpation: No palpable anomalies   Assessment  Primary Diagnosis & Pertinent Problem List: The primary encounter diagnosis was Lumbar facet syndrome (Location of Primary Source of Pain) (Bilateral) (L>R). Diagnoses of Lumbar foraminal stenosis (Bilateral L3-4 and L5-S1), Chronic knee pain (Right), and Chronic pain syndrome were also pertinent to this visit.  Status Diagnosis  Controlled Controlled Controlled 1. Lumbar facet syndrome (Location of Primary Source of Pain) (Bilateral) (L>R)   2. 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 Chronic knee pain (Right)   4. Chronic pain syndrome     Problems updated and reviewed during this visit: No problems updated. Plan of Care  Pharmacotherapy (Medications Ordered): Meds ordered this encounter  Medications  . oxyCODONE (OXY IR/ROXICODONE) 5 MG immediate release tablet    Sig: Take 1 tablet (5 mg total) by mouth every 6 (six) hours as needed for severe pain.    Dispense:  120 tablet    Refill:  0    Do not add this medication to the electronic "Automatic Refill" notification system. Patient may have prescription filled one day early if pharmacy is closed on scheduled refill date.03/21/2017 Do not fill until: 04/20/2017    Order Specific Question:   Supervising Provider    Answer:   Milinda Pointer [phone]  . oxyCODONE (OXY IR/ROXICODONE) 5 MG immediate release tablet    Sig: Take 1 tablet (5 mg total) by mouth every 6 (six) hours as needed for severe pain.    Dispense:  120 tablet    Refill:  0    Do not add this medication to the electronic "Automatic Refill" notification system. Patient may have prescription  filled one day early if pharmacy is closed on scheduled refill date. Do not fill until: 02/19/2017 To last until: 03/21/2017    Order Specific Question:   Supervising Provider    Answer:   Milinda Pointer [phone]  . oxyCODONE (OXY IR/ROXICODONE) 5 MG immediate release tablet    Sig: Take 1 tablet (5 mg total) by mouth every 6 (six) hours as needed for severe pain.    Dispense:  120 tablet    Refill:  0    Do not add this medication to the electronic "Automatic Refill" notification system. Patient may have prescription filled one day early if pharmacy is closed on scheduled refill date. Do not fill until: 01/20/2017 To last until: 02/19/2017    Order Specific Question:   Supervising Provider    Answer:   Milinda Pointer [phone]  This SmartLink is deprecated. Use AVSMEDLIST instead to display the medication list for a patient. Medications administered today: [name] had no medications administered during this visit. Lab-work, procedure(s), and/or referral(s): No orders of the defined types were placed in this encounter.  Imaging and/or referral(s): None  Interventional therapies: Planned, scheduled, and/or pending:  Not at this time.   Considering:  Therapeutic series of 5 Hyalgan right knee injections  Diagnostic intra-articular left hip joint injection  Possible left hip radiofrequency ablation  Diagnostic right intra-articular knee joint injection with local anesthetic and steroid  Diagnostic bilateral genicular nerve block  Possible bilateral genicular nerve radiofrequency ablation  Diagnostic bilateral lumbar facet block  Possible bilateral lumbar facet radiofrequency ablation  Diagnostic bilateral L3-4 and/or L5-S1 transforaminal epidural steroid injections  Diagnostic L3-4 versus L4-5 lumbar epidural steroid injection    Palliative PRN treatment(s):  Therapeutic series of 5 Hyalgan right knee  injections Diagnostic intra-articular left hip joint  injection  Diagnostic right intra-articular knee joint injection with local anesthetic and steroid  Diagnostic bilateral genicular nerve block  Diagnostic bilateral lumbar facet block  Diagnostic bilateral L3-4 and/or L5-S1 transforaminal epidural steroid injections  Diagnostic L3-4 versus L4-5 lumbar epidural steroid injection    Provider-requested follow-up: Return in about 3 months (around 03/28/2017) for MedMgmt with Me Dionisio David).  Future Appointments  Date Time Provider [place]  01/23/2017  2:30 PM Elvin So, MD [facility] None  04/18/2017  3:15 PM Allyne Gee, MD [place] None   Primary Care Physician: Glendon Axe, MD Location: [facility] Out[facility] Note by: Vevelyn Francois NP Date: 12/28/2016; Time: 2:22 PM  Pain Score Disclaimer: We use the NRS-11 scale. This is a self-reported, subjective measurement of pain severity with only modest accuracy. It is used primarily to identify changes within a particular patient. It must be understood that outpatient pain scales are significantly less accurate that those used for research, where they can be applied under ideal controlled circumstances with minimal exposure to variables. In reality, the score is likely to be a combination of pain intensity and pain affect, where pain affect describes the degree of emotional arousal or changes in action readiness caused by the sensory experience of pain. Factors such as social and work situation, setting, emotional state, anxiety levels, expectation, and prior pain experience may influence pain perception and show large inter-individual differences that may also be affected by time variables.  Patient instructions provided during this appointment: Patient Instructions   ____________________________________________________________________________________________  Medication Rules  Applies to: All patients receiving prescriptions (written or  electronic).  Pharmacy of record: Pharmacy where electronic prescriptions will be sent. If written prescriptions are taken to a different pharmacy, please inform the nursing staff. The pharmacy listed in the electronic medical record should be the one where you would like electronic prescriptions to be sent.  Prescription refills: Only during scheduled appointments. Applies to both, written and electronic prescriptions.  NOTE: The following applies primarily to controlled substances (Opioid* Pain Medications).   Patient's responsibilities: 1. Pain Pills: Bring all pain pills to every appointment (except for procedure appointments). 2. Pill Bottles: Bring pills in original pharmacy bottle. Always bring newest bottle. Bring bottle, even if empty. 3. Medication refills: You are responsible for knowing and keeping track of what medications you need refilled. The day before your appointment, write a list of all prescriptions that need to be refilled. Bring that list to your appointment and give it to the admitting nurse. Prescriptions will be written only during appointments. If you forget a medication, it will not be "Called in", "Faxed", or "electronically sent". You will need to get another appointment to get these prescribed. 4. Prescription Accuracy: You are responsible for carefully inspecting your prescriptions before leaving our office. Have the discharge nurse carefully go over each prescription with you, before taking them home. Make sure that your name is accurately spelled, that your address is correct. Check the name and dose of your medication to make sure it is accurate. Check the number of pills, and the written instructions to make sure they are clear and accurate. Make sure that you are given enough medication to last until your next medication refill appointment. 5. Taking Medication: Take medication as prescribed. Never take more pills than instructed. Never take medication more  frequently than prescribed. Taking less pills or less frequently is permitted and encouraged, when it comes to controlled substances (written  prescriptions).  6. Inform other Doctors: Always inform, all of your healthcare providers, of all the medications you take. 7. Pain Medication from other Providers: You are not allowed to accept any additional pain medication from any other Doctor or Healthcare provider. There are two exceptions to this rule. (see below) In the event that you require additional pain medication, you are responsible for notifying us, as stated below. 8. Medication Agreement: You are responsible for carefully reading and following our Medication Agreement. This must be signed before receiving any prescriptions from our practice. Safely store a copy of your signed Agreement. Violations to the Agreement will result in no further prescriptions. (Additional copies of our Medication Agreement are available upon request.) 9. Laws, Rules, & Regulations: All patients are expected to follow all Federal and [employer], [employer], Rules, [employer]. Ignorance of the Laws does not constitute a valid excuse. The use of any illegal substances is prohibited. 10. Adopted CDC guidelines & recommendations: Target dosing levels will be at or below 60 MME/day. Use of benzodiazepines** is not recommended.  Exceptions: There are only two exceptions to the rule of not receiving pain medications from other Healthcare Providers. 1. Exception #1 (Emergencies): In the event of an emergency (i.e.: accident requiring emergency care), you are allowed to receive additional pain medication. However, you are responsible for: As soon as you are able, call our office [phone], at any time of the day or night, and leave a message stating your name, the date and nature of the emergency, and the name and dose of the medication prescribed. In the event that your call is answered by a member of our staff, make sure to  document and save the date, time, and the name of the person that took your information.  2. Exception #2 (Planned Surgery): In the event that you are scheduled by another doctor or dentist to have any type of surgery or procedure, you are allowed (for a period no longer than 30 days), to receive additional pain medication, for the acute post-op pain. However, in this case, you are responsible for picking up a copy of our "Post-op Pain Management for Surgeons" handout, and giving it to your surgeon or dentist. This document is available at our office, and does not require an appointment to obtain it. Simply go to our office during business hours (Monday-Thursday from 8:00 AM to 4:00 PM) (Friday 8:00 AM to 12:00 Noon) or if you have a scheduled appointment with [place], prior to your surgery, and ask for it by name. In addition, you will need to provide [place] with your name, name of your surgeon, type of surgery, and date of procedure or surgery.  *Opioid medications include: morphine, codeine, oxycodone, oxymorphone, hydrocodone, hydromorphone, meperidine, tramadol, tapentadol, buprenorphine, fentanyl, methadone. **Benzodiazepine medications include: diazepam (Valium), alprazolam (Xanax), clonazepam (Klonopine), lorazepam (Ativan), clorazepate (Tranxene), chlordiazepoxide (Librium), estazolam (Prosom), oxazepam (Serax), temazepam (Restoril), triazolam (Halcion)  ____________________________________________________________________________________________

## 2016-12-28 NOTE — Patient Instructions (Addendum)
____________________________________________________________________________________________  Medication Rules  Applies to: All patients receiving prescriptions (written or electronic).  Pharmacy of record: Pharmacy where electronic prescriptions will be sent. If written prescriptions are taken to a different pharmacy, please inform the nursing staff. The pharmacy listed in the electronic medical record should be the one where you would like electronic prescriptions to be sent.  Prescription refills: Only during scheduled appointments. Applies to both, written and electronic prescriptions.  NOTE: The following applies primarily to controlled substances (Opioid* Pain Medications).   Patient's responsibilities: 1. Pain Pills: Bring all pain pills to every appointment (except for procedure appointments). 2. Pill Bottles: Bring pills in original pharmacy bottle. Always bring newest bottle. Bring bottle, even if empty. 3. Medication refills: You are responsible for knowing and keeping track of what medications you need refilled. The day before your appointment, write a list of all prescriptions that need to be refilled. Bring that list to your appointment and give it to the admitting nurse. Prescriptions will be written only during appointments. If you forget a medication, it will not be "Called in", "Faxed", or "electronically sent". You will need to get another appointment to get these prescribed. 4. Prescription Accuracy: You are responsible for carefully inspecting your prescriptions before leaving our office. Have the discharge nurse carefully go over each prescription with you, before taking them home. Make sure that your name is accurately spelled, that your address is correct. Check the name and dose of your medication to make sure it is accurate. Check the number of pills, and the written instructions to make sure they are clear and accurate. Make sure that you are given enough medication to  last until your next medication refill appointment. 5. Taking Medication: Take medication as prescribed. Never take more pills than instructed. Never take medication more frequently than prescribed. Taking less pills or less frequently is permitted and encouraged, when it comes to controlled substances (written prescriptions).  6. Inform other Doctors: Always inform, all of your healthcare providers, of all the medications you take. 7. Pain Medication from other Providers: You are not allowed to accept any additional pain medication from any other Doctor or Healthcare provider. There are two exceptions to this rule. (see below) In the event that you require additional pain medication, you are responsible for notifying us, as stated below. 8. Medication Agreement: You are responsible for carefully reading and following our Medication Agreement. This must be signed before receiving any prescriptions from our practice. Safely store a copy of your signed Agreement. Violations to the Agreement will result in no further prescriptions. (Additional copies of our Medication Agreement are available upon request.) 9. Laws, Rules, & Regulations: All patients are expected to follow all Federal and State Laws, Statutes, Rules, & Regulations. Ignorance of the Laws does not constitute a valid excuse. The use of any illegal substances is prohibited. 10. Adopted CDC guidelines & recommendations: Target dosing levels will be at or below 60 MME/day. Use of benzodiazepines** is not recommended.  Exceptions: There are only two exceptions to the rule of not receiving pain medications from other Healthcare Providers. 1. Exception #1 (Emergencies): In the event of an emergency (i.e.: accident requiring emergency care), you are allowed to receive additional pain medication. However, you are responsible for: As soon as you are able, call our office [phone], at any time of the day or night, and leave a message stating your  name, the date and nature of the emergency, and the name and dose of the medication   prescribed. In the event that your call is answered by a member of our staff, make sure to document and save the date, time, and the name of the person that took your information.  2. Exception #2 (Planned Surgery): In the event that you are scheduled by another doctor or dentist to have any type of surgery or procedure, you are allowed (for a period no longer than 30 days), to receive additional pain medication, for the acute post-op pain. However, in this case, you are responsible for picking up a copy of our "Post-op Pain Management for Surgeons" handout, and giving it to your surgeon or dentist. This document is available at our office, and does not require an appointment to obtain it. Simply go to our office during business hours (Monday-Thursday from 8:00 AM to 4:00 PM) (Friday 8:00 AM to 12:00 Noon) or if you have a scheduled appointment with us, prior to your surgery, and ask for it by name. In addition, you will need to provide us with your name, name of your surgeon, type of surgery, and date of procedure or surgery.  *Opioid medications include: morphine, codeine, oxycodone, oxymorphone, hydrocodone, hydromorphone, meperidine, tramadol, tapentadol, buprenorphine, fentanyl, methadone. **Benzodiazepine medications include: diazepam (Valium), alprazolam (Xanax), clonazepam (Klonopine), lorazepam (Ativan), clorazepate (Tranxene), chlordiazepoxide (Librium), estazolam (Prosom), oxazepam (Serax), temazepam (Restoril), triazolam (Halcion)  You were given 3 prescriptions for Oxycodone today.  ____________________________________________________________________________________________   

## 2016-12-28 NOTE — Progress Notes (Signed)
Nursing Pain Medication Assessment:  Safety precautions to be maintained throughout the outpatient stay will include: orient to surroundings, keep bed in low position, maintain call bell within reach at all times, provide assistance with transfer out of bed and ambulation.  Medication Inspection Compliance: Pill count conducted under aseptic conditions, in front of the patient. Neither the pills nor the bottle was removed from the patient's sight at any time. Once count was completed pills were immediately returned to the patient in their original bottle.  Medication: Oxycodone IR Pill/Patch Count: 92 of 120 pills remain Pill/Patch Appearance: Markings consistent with prescribed medication Bottle Appearance: Standard pharmacy container. Clearly labeled. Filled Date: 12 /12 / 2018 Last Medication intake:  Yesterday

## 2017-01-23 ENCOUNTER — Encounter: Payer: Self-pay | Admitting: Psychiatry

## 2017-01-23 ENCOUNTER — Other Ambulatory Visit: Payer: Self-pay

## 2017-01-23 ENCOUNTER — Ambulatory Visit (INDEPENDENT_AMBULATORY_CARE_PROVIDER_SITE_OTHER): Payer: Medicare Other | Admitting: Psychiatry

## 2017-01-23 VITALS — BP 99/57 | HR 92

## 2017-01-23 DIAGNOSIS — F5101 Primary insomnia: Secondary | ICD-10-CM | POA: Diagnosis not present

## 2017-01-23 DIAGNOSIS — F3340 Major depressive disorder, recurrent, in remission, unspecified: Secondary | ICD-10-CM | POA: Diagnosis not present

## 2017-01-23 MED ORDER — QUETIAPINE FUMARATE 50 MG PO TABS: 50.0000 mg | ORAL_TABLET | Freq: Two times a day (BID) | ORAL | 0 refills | Status: DC

## 2017-01-23 MED ORDER — QUETIAPINE FUMARATE 200 MG PO TABS: 200.0000 mg | ORAL_TABLET | Freq: Every day | ORAL | 0 refills | Status: DC

## 2017-01-23 MED ORDER — DULOXETINE HCL 30 MG PO CPEP: ORAL_CAPSULE | ORAL | 2 refills | Status: DC

## 2017-01-23 NOTE — Progress Notes (Signed)
Patient ID: Laura [name], female   DOB: May 13, 1945, 72 y.o.   MRN: [id number]  [facility] MD/PA/NP OP Progress Note  01/23/2017 2:53 PM Mayanna Garlitz  MRN:  [id number]  Subjective:  Patient is a 72 year old Caucasian female with a long history of depression and insomnia. Patient reports today that she has been doing well and that her daughter recently got married. Reports that everything went well. States she is been taking her medications regularly. Her last lipid profile shows elevated cholesterol. Recommend that patient get another set of labs today.  Visit Diagnosis:     ICD-10-CM   1. Recurrent major depressive disorder, in remission (HCC) F33.40   2. 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 Allergy   . Anxiety   . Arthritis, degenerative 10/08/2013   Overview:    a.  Lumbar spine/spinal stenosis/foot drop.   b.  Hands.   . Chronic kidney disease   . Depression   . 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 Major depression, single episode, in complete remission (HCC) 06/25/2015  . Memory loss, short term 03/19/2014  . Seizure (HCC) 10/07/2014  . Sleep apnea   . Thyroid disease     Past Surgical History:  Procedure Laterality Date  . ABDOMINAL HYSTERECTOMY    . CESAREAN SECTION    . HIP SURGERY Right   . REPLACEMENT TOTAL KNEE Left    Family History:  Family History  Problem Relation Age of Onset  . Hypertension Mother   . Stroke Mother   . Heart attack Father   . Alcohol abuse Father   . Depression Father   . Post-traumatic stress disorder Father   . Rheum arthritis Sister   . Hypertension Sister   . Depression Sister    Social History:  Social History   Socioeconomic History  . Marital status: Married    Spouse name: None  . Number of children: None  . Years of education: None  . Highest education level: None  Social Needs  . Financial resource strain: Not hard at all  . Food  insecurity - worry: Never true  . Food insecurity - inability: Never true  . Transportation needs - medical: No  . Transportation needs - non-medical: No  Occupational History    Comment: retired  Tobacco Use  . Smoking status: Never Smoker  . Smokeless tobacco: Never Used  Substance and Sexual Activity  . Alcohol use: No    Alcohol/week: 0.0 oz  . Drug use: No  . Sexual activity: Not Currently  Other Topics Concern  . None  Social History Narrative  . None   Additional History:   Assessment:   Musculoskeletal: Strength & Muscle Tone: within normal limits Gait & Station: normal Patient leans: N/A  Psychiatric Specialty Exam: Medication Refill     Review of Systems  Psychiatric/Behavioral: Negative for depression, hallucinations, memory loss, substance abuse and suicidal ideas. The patient is not nervous/anxious and does not have insomnia (Avis complaints have improved and she attributes it to  Seroquel).   All other systems reviewed and are negative.    Blood pressure (!) 99/57, pulse 92.There is no height or weight on file to calculate BMI.  General Appearance: Well Groomed  Eye Contact:  Good  Speech:  Normal Rate  Volume:  Normal  Mood:  Good  Affect:  Congruent  Thought Process:  Linear and Logical  Orientation:  Full (Time, Place, and  Person)  Thought Content:  Negative  Suicidal Thoughts:  No  Homicidal Thoughts:  No  Memory:  Immediate;   Good Recent;   Good Remote;   Good  Judgement:  Good  Insight:  Good  Psychomotor Activity:  Negative  Concentration:  Good  Recall:  Good  Fund of Knowledge: Good  Language: Good  Akathisia:  Negative  Handed:    AIMS (if indicated):  N/A  Assets:  Communication Skills Desire for Improvement Social Support  ADL's:  Intact  Cognition: WNL  Sleep:  fair   Is the patient at risk to self?  No. Has the patient been a risk to self in the past 6 months?  No. Has the patient been a risk to self within the  distant past?  No. Is the patient a risk to others?  No. Has the patient been a risk to others in the past 6 months?  No. Has the patient been a risk to others within the distant past?  No.  Current Medications: Current Outpatient Medications  Medication Sig Dispense Refill  . amLODipine (NORVASC) 10 MG tablet Take 10 mg by mouth 2 (two) times daily.   11  . aspirin EC 81 MG tablet Take by mouth.    . chlorthalidone (HYGROTON) 25 MG tablet TAKE 1/2 TABLET (12.5MG ) BY MOUTH EVERY DAY  11  . Cholecalciferol (VITAMIN D) 2000 UNITS tablet Take by mouth daily.     Marland Kitchen docusate sodium (COLACE) 100 MG capsule Take 100 mg by mouth 2 (two) times daily.    . DULoxetine (CYMBALTA) 30 MG capsule Two tablets in the morning, and one in the evening. 90 capsule 2  . esomeprazole (NEXIUM) 20 MG capsule Take by mouth daily.     . folic acid (FOLVITE) 1 MG tablet Take 1 mg by mouth daily.    . furosemide (LASIX) 40 MG tablet TAKE 1 TABLET ONCE DAILY    . hydroxychloroquine (PLAQUENIL) 200 MG tablet Take 200 mg by mouth daily.     . InFLIXimab-dyyb (INFLECTRA) 100 MG SOLR Inject into the vein every 6 (six) weeks.     Marland Kitchen levothyroxine (SYNTHROID, LEVOTHROID) 200 MCG tablet TAKE 1 TABLET ONCE DAILY- ON AN EMPTY STOMACH WITH A GLASS OF WATER 30-60 MINUTES BEFORE BREAKFAST  5  . levothyroxine (SYNTHROID, LEVOTHROID) 25 MCG tablet TAKE 2 TABLET BY MOUTH EVERY DAY (TAKE WITH 20O MCG TABLET TO EQUAL A 225 MCG DOSE)  5  . levothyroxine (SYNTHROID, LEVOTHROID) 50 MCG tablet TAKE 1 TABLET BY MOUTH ONCE DAILY (WITH 200 MCG TAB FOR 250 MCG TOTAL). 30-60 MIN BEFORE BREAKFAST    . levothyroxine (SYNTHROID, LEVOTHROID) 50 MCG tablet TAKE 1 TABLET BY MOUTH ONCE DAILY (WITH 200 MCG TAB FOR 250 MCG TOTAL). 30-60 MIN BEFORE BREAKFAST  1  . loratadine (CLARITIN) 10 MG tablet Take by mouth daily as needed.     . methotrexate (RHEUMATREX) 2.5 MG tablet TAKE 4 TABLETS (10 MG TOTAL) BY MOUTH EVERY 7 (SEVEN) DAYS WITH A MEAL  5  .  metoprolol succinate (TOPROL-XL) 50 MG 24 hr tablet TAKE 1 TABLET (50 MG TOTAL) BY MOUTH ONCE DAILY.  1  . Multiple Vitamin (MULTI-VITAMINS) TABS Take by mouth.    [name] ON 03/21/2017] oxyCODONE (OXY IR/ROXICODONE) 5 MG immediate release tablet Take 1 tablet (5 mg total) by mouth every 6 (six) hours as needed for severe pain. 120 tablet 0  . [START ON 02/19/2017] oxyCODONE (OXY IR/ROXICODONE) 5 MG immediate release tablet Take  1 tablet (5 mg total) by mouth every 6 (six) hours as needed for severe pain. 120 tablet 0  . oxyCODONE (OXY IR/ROXICODONE) 5 MG immediate release tablet Take 1 tablet (5 mg total) by mouth every 6 (six) hours as needed for severe pain. 120 tablet 0  . QUEtiapine (SEROQUEL) 200 MG tablet Take 1 tablet (200 mg total) by mouth at bedtime. 90 tablet 0  . QUEtiapine (SEROQUEL) 50 MG tablet Take 1 tablet (50 mg total) by mouth 2 (two) times daily. 180 tablet 0  . spironolactone (ALDACTONE) 25 MG tablet TAKE 1/2 (ONE-HALF) TABLET BY MOUTH DAILY  11  . traMADol (ULTRAM) 50 MG tablet TAKE ONE TABLET BY MOUTH EVERY EIGHT HOURS AS NEEDED FOR PAIN FOR UP TO FIVE DAYS.  3  . lubiprostone (AMITIZA) 24 MCG capsule Take 1 capsule (24 mcg total) by mouth 2 (two) times daily with a meal. Swallow the medication whole. Do not break or chew the medication. 180 capsule 0   No current facility-administered medications for this visit.     Medical Decision Making:  Established Problem, Stable/Improving (1), Review of Medication Regimen & Side Effects (2) and Review of New Medication or Change in Dosage (2)  Treatment Plan Summary:Medication management and Plan    Major depressive disorder, recurrent, moderate-patient will continue her Cymbalta 60 mg in the morning and 30 mg in the evening.   Insomnia- Continue Seroquel at 200 mg at bedtime.   Anxiety- take Seroquel at 50 mg twice daily. If she gets too sedated in the morning to shift change that to bedtime.  Obtain Lipid profile and  prolactin levels. Patient given lab slip.  RTC to clinic in 3 months time or call before if necessary.  Ernesha Ramone 01/23/2017, 2:53 PM

## 2017-04-06 DIAGNOSIS — Z780 Asymptomatic menopausal state: Secondary | ICD-10-CM

## 2017-04-06 HISTORY — DX: Asymptomatic menopausal state: Z78.0

## 2017-04-11 ENCOUNTER — Ambulatory Visit: Payer: PRIVATE HEALTH INSURANCE | Admitting: Nurse Practitioner

## 2017-04-12 ENCOUNTER — Ambulatory Visit: Payer: Self-pay

## 2017-04-13 ENCOUNTER — Ambulatory Visit: Payer: Medicare Other | Attending: Nurse Practitioner | Admitting: Nurse Practitioner

## 2017-04-13 ENCOUNTER — Other Ambulatory Visit: Payer: Self-pay

## 2017-04-13 ENCOUNTER — Encounter: Payer: Self-pay | Admitting: Nurse Practitioner

## 2017-04-13 VITALS — BP 157/115 | HR 86 | Temp 97.8°F | Resp 18 | Ht 65.0 in | Wt 274.0 lb

## 2017-04-13 DIAGNOSIS — Z8249 Family history of ischemic heart disease and other diseases of the circulatory system: Secondary | ICD-10-CM | POA: Diagnosis not present

## 2017-04-13 DIAGNOSIS — F325 Major depressive disorder, single episode, in full remission: Secondary | ICD-10-CM | POA: Insufficient documentation

## 2017-04-13 DIAGNOSIS — K219 Gastro-esophageal reflux disease without esophagitis: Secondary | ICD-10-CM | POA: Insufficient documentation

## 2017-04-13 DIAGNOSIS — Z79891 Long term (current) use of opiate analgesic: Secondary | ICD-10-CM | POA: Diagnosis not present

## 2017-04-13 DIAGNOSIS — Z90710 Acquired absence of both cervix and uterus: Secondary | ICD-10-CM | POA: Diagnosis not present

## 2017-04-13 DIAGNOSIS — T402X5A Adverse effect of other opioids, initial encounter: Secondary | ICD-10-CM

## 2017-04-13 DIAGNOSIS — E1122 Type 2 diabetes mellitus with diabetic chronic kidney disease: Secondary | ICD-10-CM | POA: Diagnosis not present

## 2017-04-13 DIAGNOSIS — M47816 Spondylosis without myelopathy or radiculopathy, lumbar region: Secondary | ICD-10-CM

## 2017-04-13 DIAGNOSIS — K5903 Drug induced constipation: Secondary | ICD-10-CM

## 2017-04-13 DIAGNOSIS — Z5181 Encounter for therapeutic drug level monitoring: Secondary | ICD-10-CM | POA: Insufficient documentation

## 2017-04-13 DIAGNOSIS — M48062 Spinal stenosis, lumbar region with neurogenic claudication: Secondary | ICD-10-CM | POA: Insufficient documentation

## 2017-04-13 DIAGNOSIS — F419 Anxiety disorder, unspecified: Secondary | ICD-10-CM | POA: Diagnosis not present

## 2017-04-13 DIAGNOSIS — G4730 Sleep apnea, unspecified: Secondary | ICD-10-CM | POA: Insufficient documentation

## 2017-04-13 DIAGNOSIS — Z888 Allergy status to other drugs, medicaments and biological substances status: Secondary | ICD-10-CM | POA: Insufficient documentation

## 2017-04-13 DIAGNOSIS — E039 Hypothyroidism, unspecified: Secondary | ICD-10-CM | POA: Insufficient documentation

## 2017-04-13 DIAGNOSIS — Z96652 Presence of left artificial knee joint: Secondary | ICD-10-CM | POA: Insufficient documentation

## 2017-04-13 DIAGNOSIS — G894 Chronic pain syndrome: Secondary | ICD-10-CM | POA: Insufficient documentation

## 2017-04-13 DIAGNOSIS — Z8261 Family history of arthritis: Secondary | ICD-10-CM | POA: Diagnosis not present

## 2017-04-13 DIAGNOSIS — Z823 Family history of stroke: Secondary | ICD-10-CM | POA: Diagnosis not present

## 2017-04-13 DIAGNOSIS — M79641 Pain in right hand: Secondary | ICD-10-CM | POA: Diagnosis present

## 2017-04-13 DIAGNOSIS — N183 Chronic kidney disease, stage 3 (moderate): Secondary | ICD-10-CM | POA: Diagnosis not present

## 2017-04-13 DIAGNOSIS — Z6841 Body Mass Index (BMI) 40.0 and over, adult: Secondary | ICD-10-CM | POA: Insufficient documentation

## 2017-04-13 DIAGNOSIS — R413 Other amnesia: Secondary | ICD-10-CM | POA: Insufficient documentation

## 2017-04-13 DIAGNOSIS — Z880 Allergy status to penicillin: Secondary | ICD-10-CM | POA: Insufficient documentation

## 2017-04-13 DIAGNOSIS — Z79890 Hormone replacement therapy (postmenopausal): Secondary | ICD-10-CM | POA: Insufficient documentation

## 2017-04-13 DIAGNOSIS — M160 Bilateral primary osteoarthritis of hip: Secondary | ICD-10-CM | POA: Insufficient documentation

## 2017-04-13 DIAGNOSIS — G4700 Insomnia, unspecified: Secondary | ICD-10-CM | POA: Insufficient documentation

## 2017-04-13 DIAGNOSIS — Z79899 Other long term (current) drug therapy: Secondary | ICD-10-CM | POA: Insufficient documentation

## 2017-04-13 DIAGNOSIS — G8929 Other chronic pain: Secondary | ICD-10-CM

## 2017-04-13 DIAGNOSIS — E7800 Pure hypercholesterolemia, unspecified: Secondary | ICD-10-CM | POA: Insufficient documentation

## 2017-04-13 DIAGNOSIS — R7982 Elevated C-reactive protein (CRP): Secondary | ICD-10-CM | POA: Insufficient documentation

## 2017-04-13 DIAGNOSIS — F329 Major depressive disorder, single episode, unspecified: Secondary | ICD-10-CM | POA: Insufficient documentation

## 2017-04-13 DIAGNOSIS — I129 Hypertensive chronic kidney disease with stage 1 through stage 4 chronic kidney disease, or unspecified chronic kidney disease: Secondary | ICD-10-CM | POA: Diagnosis not present

## 2017-04-13 DIAGNOSIS — M0600 Rheumatoid arthritis without rheumatoid factor, unspecified site: Secondary | ICD-10-CM | POA: Insufficient documentation

## 2017-04-13 DIAGNOSIS — K5900 Constipation, unspecified: Secondary | ICD-10-CM | POA: Diagnosis not present

## 2017-04-13 DIAGNOSIS — M25552 Pain in left hip: Secondary | ICD-10-CM | POA: Diagnosis not present

## 2017-04-13 DIAGNOSIS — M170 Bilateral primary osteoarthritis of knee: Secondary | ICD-10-CM | POA: Insufficient documentation

## 2017-04-13 DIAGNOSIS — Z7982 Long term (current) use of aspirin: Secondary | ICD-10-CM | POA: Insufficient documentation

## 2017-04-13 DIAGNOSIS — M797 Fibromyalgia: Secondary | ICD-10-CM | POA: Insufficient documentation

## 2017-04-13 MED ORDER — OXYCODONE HCL 5 MG PO TABS: 5.0000 mg | ORAL_TABLET | Freq: Four times a day (QID) | ORAL | 0 refills | Status: DC | PRN

## 2017-04-13 MED ORDER — LUBIPROSTONE 24 MCG PO CAPS: 24.0000 ug | ORAL_CAPSULE | Freq: Two times a day (BID) | ORAL | 0 refills | Status: DC

## 2017-04-13 NOTE — Progress Notes (Signed)
Nursing Pain Medication Assessment:  Safety precautions to be maintained throughout the outpatient stay will include: orient to surroundings, keep bed in low position, maintain call bell within reach at all times, provide assistance with transfer out of bed and ambulation.  Medication Inspection Compliance: Pill count conducted under aseptic conditions, in front of the patient. Neither the pills nor the bottle was removed from the patient's sight at any time. Once count was completed pills were immediately returned to the patient in their original bottle.  Medication: Oxycodone IR Pill/Patch Count: 14 of 120 pills remain Pill/Patch Appearance: Markings consistent with prescribed medication Bottle Appearance: Standard pharmacy container. Clearly labeled. Filled Date: 03 / 12 / 2019 Last Medication intake:  Today

## 2017-04-13 NOTE — Patient Instructions (Addendum)
____________________________________________________________________________________________  Medication Rules  Applies to: All patients receiving prescriptions (written or electronic).  Pharmacy of record: Pharmacy where electronic prescriptions will be sent. If written prescriptions are taken to a different pharmacy, please inform the nursing staff. The pharmacy listed in the electronic medical record should be the one where you would like electronic prescriptions to be sent.  Prescription refills: Only during scheduled appointments. Applies to both, written and electronic prescriptions.  NOTE: The following applies primarily to controlled substances (Opioid* Pain Medications).   Patient's responsibilities: 1. Pain Pills: Bring all pain pills to every appointment (except for procedure appointments). 2. Pill Bottles: Bring pills in original pharmacy bottle. Always bring newest bottle. Bring bottle, even if empty. 3. Medication refills: You are responsible for knowing and keeping track of what medications you need refilled. The day before your appointment, write a list of all prescriptions that need to be refilled. Bring that list to your appointment and give it to the admitting nurse. Prescriptions will be written only during appointments. If you forget a medication, it will not be "Called in", "Faxed", or "electronically sent". You will need to get another appointment to get these prescribed. 4. Prescription Accuracy: You are responsible for carefully inspecting your prescriptions before leaving our office. Have the discharge nurse carefully go over each prescription with you, before taking them home. Make sure that your name is accurately spelled, that your address is correct. Check the name and dose of your medication to make sure it is accurate. Check the number of pills, and the written instructions to make sure they are clear and accurate. Make sure that you are given enough medication to last  until your next medication refill appointment. 5. Taking Medication: Take medication as prescribed. Never take more pills than instructed. Never take medication more frequently than prescribed. Taking less pills or less frequently is permitted and encouraged, when it comes to controlled substances (written prescriptions).  6. Inform other Doctors: Always inform, all of your healthcare providers, of all the medications you take. 7. Pain Medication from other Providers: You are not allowed to accept any additional pain medication from any other Doctor or Healthcare provider. There are two exceptions to this rule. (see below) In the event that you require additional pain medication, you are responsible for notifying us, as stated below. 8. Medication Agreement: You are responsible for carefully reading and following our Medication Agreement. This must be signed before receiving any prescriptions from our practice. Safely store a copy of your signed Agreement. Violations to the Agreement will result in no further prescriptions. (Additional copies of our Medication Agreement are available upon request.) 9. Laws, Rules, & Regulations: All patients are expected to follow all Federal and State Laws, Statutes, Rules, & Regulations. Ignorance of the Laws does not constitute a valid excuse. The use of any illegal substances is prohibited. 10. Adopted CDC guidelines & recommendations: Target dosing levels will be at or below 60 MME/day. Use of benzodiazepines** is not recommended.  Exceptions: There are only two exceptions to the rule of not receiving pain medications from other Healthcare Providers. 1. Exception #1 (Emergencies): In the event of an emergency (i.e.: accident requiring emergency care), you are allowed to receive additional pain medication. However, you are responsible for: As soon as you are able, call our office [phone], at any time of the day or night, and leave a message stating your name, the  date and nature of the emergency, and the name and dose of the medication   prescribed. In the event that your call is answered by a member of our staff, make sure to document and save the date, time, and the name of the person that took your information.  2. Exception #2 (Planned Surgery): In the event that you are scheduled by another doctor or dentist to have any type of surgery or procedure, you are allowed (for a period no longer than 30 days), to receive additional pain medication, for the acute post-op pain. However, in this case, you are responsible for picking up a copy of our "Post-op Pain Management for Surgeons" handout, and giving it to your surgeon or dentist. This document is available at our office, and does not require an appointment to obtain it. Simply go to our office during business hours (Monday-Thursday from 8:00 AM to 4:00 PM) (Friday 8:00 AM to 12:00 Noon) or if you have a scheduled appointment with [place], prior to your surgery, and ask for it by name. In addition, you will need to provide [place] with your name, name of your surgeon, type of surgery, and date of procedure or surgery.  *Opioid medications include: morphine, codeine, oxycodone, oxymorphone, hydrocodone, hydromorphone, meperidine, tramadol, tapentadol, buprenorphine, fentanyl, methadone. **Benzodiazepine medications include: diazepam (Valium), alprazolam (Xanax), clonazepam (Klonopine), lorazepam (Ativan), clorazepate (Tranxene), chlordiazepoxide (Librium), estazolam (Prosom), oxazepam (Serax), temazepam (Restoril), triazolam (Halcion) (Last updated: 03/09/2017) ____________________________________________________________________________________________    BMI Assessment: Estimated body mass index is 45.6 kg/m as calculated from the following:   Height as of this encounter: 5\' 5"  (1.651 m).   Weight as of this encounter: 274 lb (124.3 kg).  BMI interpretation table: BMI level Category Range association with higher  incidence of chronic pain  <18 kg/m2 Underweight   18.5-24.9 kg/m2 Ideal body weight   25-29.9 kg/m2 Overweight Increased incidence by 20%  30-34.9 kg/m2 Obese (Class I) Increased incidence by 68%  35-39.9 kg/m2 Severe obesity (Class II) Increased incidence by 136%  >40 kg/m2 Extreme obesity (Class III) Increased incidence by 254%   BMI Readings from Last 4 Encounters:  04/13/17 45.60 kg/m  12/28/16 46.59 kg/m  10/10/16 44.93 kg/m  09/01/16 45.76 kg/m   Wt Readings from Last 4 Encounters:  04/13/17 274 lb (124.3 kg)  12/28/16 280 lb (127 kg)  10/10/16 270 lb (122.5 kg)  09/01/16 275 lb (124.7 kg)

## 2017-04-13 NOTE — Progress Notes (Signed)
[name]  MRN: [id number]  Referring Provider: Glendon Axe, MD  DOB: [birth year]  PCP: Glendon Axe, MD  DOS: 04/13/2017  Note by: Vevelyn Francois NP  Service setting: Ambulatory outpatient  Specialty: Interventional Pain Management  Location: [facility] (AMB) Pain Management Facility    Patient type: Established    Primary Reason(s) for Visit: Encounter for prescription drug management. (Level of risk: moderate)  CC: Hand Pain (bilateral); Foot Pain (bilateral); and Hip Pain (bilateral)  HPI  Laura [name] is a 72 y.o. year old, female patient, who comes today for a medication management evaluation. She has CKD (chronic kidney disease) stage 3, GFR 30-59 ml/min ([place]); Depressive disorder; Essential (primary) hypertension; GERD (gastroesophageal reflux disease); Adult hypothyroidism; Hypercholesterolemia; Extreme obesity ([place]); Insomnia secondary to chronic pain; Congestive heart failure with left ventricular systolic dysfunction ([place]); Type 2 diabetes mellitus ([place]); Morbid obesity ([place]); Seronegative rheumatoid arthritis ([place]); Chronic knee pain (Right); Opiate use (30 MME/Day); Long term current use of opiate analgesic; Long term prescription opiate use; Encounter for therapeutic drug level monitoring; Opioid dependence ([place]); Lumbar spinal stenosis (5 mm Severe L3-4; 8 mm L4-5); Lumbar spondylosis; Lumbar facet syndrome (Location of Primary Source of Pain) (Bilateral) (L>R); Chronic pain syndrome; Chronic low back pain (Location of Primary Source of Pain) (Bilateral) (L>R); History of TKR (total knee replacement) (Left); History of femur fracture (Right); 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); 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 Her primarily concern today is the Hand Pain (bilateral); Foot Pain (bilateral); and Hip Pain (bilateral)  Pain Assessment: Location: Left, Right Hip Onset: More than a month ago Duration: Chronic pain Quality: Aching Severity: 2 /10 (self-reported pain score)  Note: Reported level is compatible with observation.                          Timing: Intermittent Modifying factors: medications  Laura [name] was last scheduled for an appointment on 12/28/2016 for medication management. During today's appointment we reviewed Laura [name]'s chronic pain status, as well as her outpatient medication regimen. She admits that her hands and feet are the worse. She states with her resp. Infection increased the RA pain. She states that the weather cause her pain to be worse.   The patient  reports that she does not use drugs. Her body mass index is 45.6 kg/m.  Further details on both, my assessment(s), as well as the proposed treatment plan, please see below.  Controlled Substance Pharmacotherapy Assessment REMS (Risk Evaluation and Mitigation Strategy)  Analgesic:Oxycodone IR 5 mg every 6 hours (20 mg/day) MME/day:30 mg/day   Hart Rochester, RN  04/13/2017  1:59 PM  Sign at close encounter Nursing Pain Medication Assessment:  Safety precautions to be maintained throughout the outpatient stay will include: orient to surroundings, keep bed in low position, maintain call bell within reach at all times, provide assistance with transfer out of bed and ambulation.  Medication Inspection Compliance: Pill count conducted under aseptic conditions, in front of the patient. Neither the pills nor the  bottle was removed from the patient's sight at  any time. Once count was completed pills were immediately returned to the patient in their original bottle.  Medication: Oxycodone IR Pill/Patch Count: 14 of 120 pills remain Pill/Patch Appearance: Markings consistent with prescribed medication Bottle Appearance: Standard pharmacy container. Clearly labeled. Filled Date: 03 / 12 / 2019 Last Medication intake:  Today   Pharmacokinetics: Liberation and absorption (onset of action): WNL Distribution (time to peak effect): WNL Metabolism and excretion (duration of action): WNL         Pharmacodynamics: Desired effects: Analgesia: Laura [name] reports >50% benefit. Functional ability: Patient reports that medication allows her to accomplish basic ADLs Clinically meaningful improvement in function (CMIF): Sustained CMIF goals met Perceived effectiveness: Described as relatively effective, allowing for increase in activities of daily living (ADL) Undesirable effects: Side-effects or Adverse reactions: None reported Monitoring: [place] PMP: Online review of the past 54-monthperiod conducted. Compliant with practice rules and regulations Last UDS on record: Summary  Date Value Ref Range Status  07/11/2016 FINAL  Final    Comment:    ==================================================================== TOXASSURE SELECT 13 (MW) ==================================================================== Test                             Result       Flag       Units Drug Present and Declared for Prescription Verification   Oxycodone                      575          EXPECTED   ng/mg creat   Oxymorphone                    226          EXPECTED   ng/mg creat   Noroxycodone                   1580         EXPECTED   ng/mg creat   Noroxymorphone                 237          EXPECTED   ng/mg creat    Sources of oxycodone are scheduled prescription medications.    Oxymorphone, noroxycodone, and noroxymorphone are  expected    metabolites of oxycodone. Oxymorphone is also available as a    scheduled prescription medication.   Tramadol                       PRESENT      EXPECTED   O-Desmethyltramadol            PRESENT      EXPECTED   N-Desmethyltramadol            PRESENT      EXPECTED    Source of tramadol is a prescription medication.    O-desmethyltramadol and N-desmethyltramadol are expected    metabolites of tramadol. ==================================================================== Test                      Result    Flag   Units      Ref Range   Creatinine              168              mg/dL      >=20 ==================================================================== Declared Medications:  The flagging and interpretation  on this report are based on the  following declared medications.  Unexpected results may arise from  inaccuracies in the declared medications.  **Note: The testing scope of this panel includes these medications:  Oxycodone  Tramadol  **Note: The testing scope of this panel does not include following  reported medications:  Amlodipine (Norvasc)  Aspirin (Aspirin 81)  Chlorthalidone  Docusate (Colace)  Duloxetine (Cymbalta)  Folic acid (Folvite)  Furosemide (Lasix)  Hydroxychloroquine (Plaquenil)  Infliximab  Levothyroxine  Loratadine (Claritin)  Lubiprostone (Amitiza)  Methotrexate  Metoprolol  Multivitamin  Omeprazole (Nexium)  Quetiapine (Seroquel)  Spironolactone (Aldactone)  Vitamin D ==================================================================== For clinical consultation, please call [phone]. ====================================================================    UDS interpretation: Compliant          Medication Assessment Form: Reviewed. Patient indicates being compliant with therapy Treatment compliance: Compliant Risk Assessment Profile: Aberrant behavior: See prior evaluations. None observed or detected today Comorbid factors  increasing risk of overdose: See prior notes. No additional risks detected today Risk of substance use disorder (SUD): Low  ORT Scoring interpretation table:  Score <3 = Low Risk for SUD  Score between 4-7 = Moderate Risk for SUD  Score >8 = High Risk for Opioid Abuse   Risk Mitigation Strategies:  Patient Counseling: Covered Patient-Prescriber Agreement (PPA): Present and active  Notification to other healthcare providers: Done  Pharmacologic Plan: No change in therapy, at this time.             Laboratory Chemistry  Inflammation Markers (CRP: Acute Phase) (ESR: Chronic Phase) Lab Results  Component Value Date   CRP 1.9 (H) 07/15/2015   ESRSEDRATE 32 (H) 07/15/2015                         Rheumatology Markers No results found for: RF, ANA, LABURIC, URICUR, LYMEIGGIGMAB, LYMEABIGMQN                      Renal Function Markers Lab Results  Component Value Date   BUN 21 (H) 07/15/2015   CREATININE 1.22 (H) 07/15/2015   GFRAA 51 (L) 07/15/2015   GFRNONAA 44 (L) 07/15/2015                              Hepatic Function Markers Lab Results  Component Value Date   AST 19 07/15/2015   ALT 15 07/15/2015   ALBUMIN 4.0 07/15/2015   ALKPHOS 73 07/15/2015                        Electrolytes Lab Results  Component Value Date   NA 140 07/15/2015   K 3.7 07/15/2015   CL 101 07/15/2015   CALCIUM 9.1 07/15/2015   MG 2.1 07/15/2015                        Neuropathy Markers Lab Results  Component Value Date   VITAMINB12 610 07/15/2015                        Bone Pathology Markers Lab Results  Component Value Date   25OHVITD1 36 07/15/2015   25OHVITD2 3.6 07/15/2015   25OHVITD3 32 07/15/2015                         Coagulation Parameters Lab Results  Component Value Date  PLT 251 06/12/2013                        Cardiovascular Markers Lab Results  Component Value Date   TROPONINI < 0.02 06/12/2013   HGB 13.6 06/12/2013   HCT 41.8 06/12/2013                          CA Markers No results found for: CEA, CA125, LABCA2                      Note: Lab results reviewed.  Recent Diagnostic Imaging Results  MM SCREENING BREAST TOMO BILATERAL CLINICAL DATA:  Screening.  EXAM: 2D DIGITAL SCREENING BILATERAL MAMMOGRAM WITH CAD AND ADJUNCT TOMO  COMPARISON:  Previous exam(s).  ACR Breast Density Category b: There are scattered areas of fibroglandular density.  FINDINGS: There are no findings suspicious for malignancy. Images were processed with CAD.  IMPRESSION: No mammographic evidence of malignancy. A result letter of this screening mammogram will be mailed directly to the patient.  RECOMMENDATION: Screening mammogram in one year. (Code:[id number]-B-01Y)  BI-RADS CATEGORY  1: Negative.  Electronically Signed   By: Ammie Ferrier M.D.   On: 12/09/2016 10:03  Complexity Note: Imaging results reviewed. Results shared with Ms. Dugo, using Layman's terms.                         Meds   Current Outpatient Medications:  .  amLODipine (NORVASC) 10 MG tablet, Take 10 mg by mouth 2 (two) times daily. , Disp: , Rfl: 11 .  aspirin EC 81 MG tablet, Take by mouth., Disp: , Rfl:  .  chlorthalidone (HYGROTON) 25 MG tablet, TAKE 1/2 TABLET (12.5MG) BY MOUTH EVERY DAY, Disp: , Rfl: 11 .  Cholecalciferol (VITAMIN D) 2000 UNITS tablet, Take by mouth daily. , Disp: , Rfl:  .  docusate sodium (COLACE) 100 MG capsule, Take 100 mg by mouth 2 (two) times daily., Disp: , Rfl:  .  DULoxetine (CYMBALTA) 30 MG capsule, Two tablets in the morning, and one in the evening., Disp: 90 capsule, Rfl: 2 .  esomeprazole (NEXIUM) 20 MG capsule, Take by mouth daily. , Disp: , Rfl:  .  folic acid (FOLVITE) 1 MG tablet, Take 1 mg by mouth daily., Disp: , Rfl:  .  furosemide (LASIX) 40 MG tablet, TAKE 1 TABLET ONCE DAILY, Disp: , Rfl:  .  hydroxychloroquine (PLAQUENIL) 200 MG tablet, Take 200 mg by mouth daily. , Disp: , Rfl:  .  InFLIXimab-dyyb (INFLECTRA) 100 MG SOLR,  Inject into the vein every 6 (six) weeks. , Disp: , Rfl:  .  levothyroxine (SYNTHROID, LEVOTHROID) 200 MCG tablet, TAKE 1 TABLET ONCE DAILY- ON AN EMPTY STOMACH WITH A GLASS OF WATER 30-60 MINUTES BEFORE BREAKFAST, Disp: , Rfl: 5 .  loratadine (CLARITIN) 10 MG tablet, Take by mouth daily as needed. , Disp: , Rfl:  .  lubiprostone (AMITIZA) 24 MCG capsule, Take 1 capsule (24 mcg total) by mouth 2 (two) times daily with a meal. Swallow the medication whole. Do not break or chew the medication., Disp: 180 capsule, Rfl: 0 .  methotrexate (RHEUMATREX) 2.5 MG tablet, TAKE 4 TABLETS (10 MG TOTAL) BY MOUTH EVERY 7 (SEVEN) DAYS WITH A MEAL, Disp: , Rfl: 5 .  metoprolol succinate (TOPROL-XL) 50 MG 24 hr tablet, TAKE 1 TABLET (50 MG TOTAL) BY MOUTH ONCE DAILY., Disp: ,  Rfl: 1 .  Multiple Vitamin (MULTI-VITAMINS) TABS, Take by mouth., Disp: , Rfl:  .  [START ON 05/20/2017] oxyCODONE (OXY IR/ROXICODONE) 5 MG immediate release tablet, Take 1 tablet (5 mg total) by mouth every 6 (six) hours as needed for severe pain., Disp: 120 tablet, Rfl: 0 .  QUEtiapine (SEROQUEL) 200 MG tablet, Take 1 tablet (200 mg total) by mouth at bedtime., Disp: 90 tablet, Rfl: 0 .  QUEtiapine (SEROQUEL) 50 MG tablet, Take 1 tablet (50 mg total) by mouth 2 (two) times daily., Disp: 180 tablet, Rfl: 0 .  spironolactone (ALDACTONE) 25 MG tablet, TAKE 1/2 (ONE-HALF) TABLET BY MOUTH DAILY, Disp: , Rfl: 11 .  [START ON 06/19/2017] oxyCODONE (OXY IR/ROXICODONE) 5 MG immediate release tablet, Take 1 tablet (5 mg total) by mouth every 6 (six) hours as needed for severe pain., Disp: 120 tablet, Rfl: 0 .  [START ON 04/20/2017] oxyCODONE (OXY IR/ROXICODONE) 5 MG immediate release tablet, Take 1 tablet (5 mg total) by mouth every 6 (six) hours as needed for severe pain., Disp: 120 tablet, Rfl: 0  ROS  Constitutional: Denies any fever or chills Gastrointestinal: No reported hemesis, hematochezia, vomiting, or acute GI distress Musculoskeletal: Denies any  acute onset joint swelling, redness, loss of ROM, or weakness Neurological: No reported episodes of acute onset apraxia, aphasia, dysarthria, agnosia, amnesia, paralysis, loss of coordination, or loss of consciousness  Allergies  Ms. Lindeman is allergic to bupropion and penicillins.  PFSH  Drug: Ms. Dorff  reports that she does not use drugs. Alcohol:  reports that she does not drink alcohol. Tobacco:  reports that she has never smoked. She has never used smokeless tobacco. Medical:  has a past medical history of Allergy, Anxiety, Arthritis, degenerative (10/08/2013), Chronic kidney disease, Depression, Lumbar spinal stenosis with neurogenic claudication (11/07/2014), Major depression, single episode, in complete remission ([place]) (06/25/2015), Memory loss, short term (03/19/2014), Seizure ([place]) (10/07/2014), Sleep apnea, and Thyroid disease. Surgical: Ms. Mossbarger  has a past surgical history that includes Abdominal hysterectomy; Cesarean section; Replacement total knee (Left); and Hip surgery (Right). Family: family history includes Alcohol abuse in her father; Depression in her father and sister; Heart attack in her father; Hypertension in her mother and sister; Post-traumatic stress disorder in her father; Rheum arthritis in her sister; Stroke in her mother.  Constitutional Exam  General appearance: Well nourished, well developed, and well hydrated. In no apparent acute distress Vitals:   04/13/17 1400  BP: (!) 157/115  Pulse: 86  Resp: 18  Temp: 97.8 F (36.6 C)  TempSrc: Oral  SpO2: 95%  Weight: 274 lb (124.3 kg)  Height: _0  (1.651 m)  Psych/Mental status: Alert, oriented x 3 (person, place, & time)       Eyes: PERLA Respiratory: No evidence of acute respiratory distress  Cervical Spine Area Exam  Skin & Axial Inspection: No masses, redness, edema, swelling, or associated skin lesions Alignment: Symmetrical Functional ROM: Unrestricted ROM      Stability: No instability  detected Muscle Tone/Strength: Functionally intact. No obvious neuro-muscular anomalies detected. Sensory (Neurological): Unimpaired Palpation: No palpable anomalies              Upper Extremity (UE) Exam    Side: Right upper extremity  Side: Left upper extremity  Skin & Extremity Inspection: Skin color, temperature, and hair growth are WNL. No peripheral edema or cyanosis. No masses, redness, swelling, asymmetry, or associated skin lesions. No contractures.  Skin & Extremity Inspection: Skin color, temperature, and hair growth are WNL.  No peripheral edema or cyanosis. No masses, redness, swelling, asymmetry, or associated skin lesions. No contractures.  Functional ROM: Decreased ROM for hand  Functional ROM: Decreased ROM for hand  Muscle Tone/Strength: Functionally intact. No obvious neuro-muscular anomalies detected.  Muscle Tone/Strength: Functionally intact. No obvious neuro-muscular anomalies detected.  Sensory (Neurological): Unimpaired          Sensory (Neurological): Unimpaired          Palpation: No palpable anomalies              Palpation: No palpable anomalies              Specialized Test(s): Deferred         Specialized Test(s): Deferred          Thoracic Spine Area Exam  Skin & Axial Inspection: No masses, redness, or swelling Alignment: Symmetrical Functional ROM: Unrestricted ROM Stability: No instability detected Muscle Tone/Strength: Functionally intact. No obvious neuro-muscular anomalies detected. Sensory (Neurological): Unimpaired Muscle strength & Tone: No palpable anomalies  Lumbar Spine Area Exam  Skin & Axial Inspection: No masses, redness, or swelling Alignment: Symmetrical 2Functional ROM: Unrestricted ROM      Stability: No instability detected Muscle Tone/Strength: Functionally intact. No obvious neuro-muscular anomalies detected. Sensory (Neurological): Unimpaired Palpation: No palpable anomalies       Provocative Tests: Lumbar Hyperextension and  rotation test: evaluation deferred today       Lumbar Lateral bending test: evaluation deferred today       Patrick's Maneuver: evaluation deferred today                    Gait & Posture Assessment  Ambulation: Patient ambulates using a walker Gait: Relatively normal for age and body habitus Posture: WNL   Lower Extremity Exam    Side: Right lower extremity  Side: Left lower extremity  Skin & Extremity Inspection: Skin color, temperature, and hair growth are WNL. No peripheral edema or cyanosis. No masses, redness, swelling, asymmetry, or associated skin lesions. No contractures.  Skin & Extremity Inspection: Skin color, temperature, and hair growth are WNL. No peripheral edema or cyanosis. No masses, redness, swelling, asymmetry, or associated skin lesions. No contractures.  Functional ROM: Unrestricted ROM          Functional ROM: Unrestricted ROM          Muscle Tone/Strength: Functionally intact. No obvious neuro-muscular anomalies detected.  Muscle Tone/Strength: Functionally intact. No obvious neuro-muscular anomalies detected.  Sensory (Neurological): Unimpaired  Sensory (Neurological): Unimpaired  Palpation: No palpable anomalies  Palpation: No palpable anomalies   Assessment  Primary Diagnosis & Pertinent Problem List: The primary encounter diagnosis was Seronegative rheumatoid arthritis ([place]). Diagnoses of Lumbar spondylosis, Chronic hip pain (Left), Chronic pain syndrome, Opioid-induced constipation (OIC), and Long term prescription opiate use were also pertinent to this visit.  Status Diagnosis  Stable Stable Stable 1. Seronegative rheumatoid arthritis ([place])   2. Lumbar spondylosis   3. Chronic hip pain (Left)   4. Chronic pain syndrome   5. Opioid-induced constipation (OIC)   6. Long term prescription opiate use     Problems updated and reviewed during this visit: Problem  Postmenopausal  CKD (chronic kidney disease) stage 3, GFR 30-59 ml/min ([facility])   Plan of Care   Pharmacotherapy (Medications Ordered): Meds ordered this encounter  Medications  . oxyCODONE (OXY IR/ROXICODONE) 5 MG immediate release tablet    Sig: Take 1 tablet (5 mg total) by mouth every 6 (six) hours as needed for  severe pain.    Dispense:  120 tablet    Refill:  0    Do not add this medication to the electronic "Automatic Refill" notification system. Patient may have prescription filled one day early if pharmacy is closed on scheduled refill date. Do not fill until: 06/19/2017 To last until: 07/19/2017    Order Specific Question:   Supervising Provider    Answer:   Milinda Pointer [phone]  . oxyCODONE (OXY IR/ROXICODONE) 5 MG immediate release tablet    Sig: Take 1 tablet (5 mg total) by mouth every 6 (six) hours as needed for severe pain.    Dispense:  120 tablet    Refill:  0    Do not add this medication to the electronic "Automatic Refill" notification system. Patient may have prescription filled one day early if pharmacy is closed on scheduled refill date. Do not fill until: 05/20/2017 to last until 06/19/2017    Order Specific Question:   Supervising Provider    Answer:   Milinda Pointer [phone]  . oxyCODONE (OXY IR/ROXICODONE) 5 MG immediate release tablet    Sig: Take 1 tablet (5 mg total) by mouth every 6 (six) hours as needed for severe pain.    Dispense:  120 tablet    Refill:  0    Do not add this medication to the electronic "Automatic Refill" notification system. Patient may have prescription filled one day early if pharmacy is closed on scheduled refill date. Do not fill until: 04/20/2017 To last until: 05/20/2017    Order Specific Question:   Supervising Provider    Answer:   Milinda Pointer [phone]  . lubiprostone (AMITIZA) 24 MCG capsule    Sig: Take 1 capsule (24 mcg total) by mouth 2 (two) times daily with a meal. Swallow the medication whole. Do not break or chew the medication.    Dispense:  180 capsule    Refill:  0    Do not place this  medication, or any other prescription from our practice, on "Automatic Refill". Patient may have prescription filled one day early if pharmacy is closed on scheduled refill date.    Order Specific Question:   Supervising Provider    Answer:   Milinda Pointer [[id number]]   New Prescriptions   No medications on file   Medications administered today: Selisa Tensley had no medications administered during this visit. Lab-work, procedure(s), and/or referral(s): Orders Placed This Encounter  Procedures  . ToxASSURE Select 13 (MW), Urine   Imaging and/or referral(s): None  Interventional therapies: Planned, scheduled, and/or pending:  Not at this time.   Considering:  Therapeutic series of 5 Hyalgan right knee injections  Diagnostic intra-articular left hip joint injection  Possible left hip radiofrequency ablation  Diagnostic right intra-articular knee joint injection with local anesthetic and steroid  Diagnostic bilateral genicular nerve block  Possible bilateral genicular nerve radiofrequency ablation  Diagnostic bilateral lumbar facet block  Possible bilateral lumbar facet radiofrequency ablation  Diagnostic bilateral L3-4 and/or L5-S1 transforaminal epidural steroid injections  Diagnostic L3-4 versus L4-5 lumbar epidural steroid injection    Palliative PRN treatment(s):  Therapeutic series of 5 Hyalgan right knee injections Diagnostic intra-articular left hip joint injection  Diagnostic right intra-articular knee joint injection with local anesthetic and steroid  Diagnostic bilateral genicular nerve block  Diagnostic bilateral lumbar facet block  Diagnostic bilateral L3-4 and/or L5-S1 transforaminal epidural steroid injections  Diagnostic L3-4 versus L4-5 lumbar epidural steroid injection      Provider-requested follow-up: Return in about 3  months (around 07/10/2017) for MedMgmt with Me Donella Stade [facility] Pace).  Future Appointments  Date Time Provider [place]   04/18/2017  3:15 PM Allyne Gee, MD [facility] None  04/20/2017  2:30 PM Elvin So, MD [facility] None  07/10/2017  1:45 PM Vevelyn Francois, NP [facility] None   Primary Care Physician: Glendon Axe, MD Location: [facility] Note by: Vevelyn Francois NP Date: 04/13/2017; Time: 3:37 PM  Pain Score Disclaimer: We use the NRS-11 scale. This is a self-reported, subjective measurement of pain severity with only modest accuracy. It is used primarily to identify changes within a particular patient. It must be understood that outpatient pain scales are significantly less accurate that those used for research, where they can be applied under ideal controlled circumstances with minimal exposure to variables. In reality, the score is likely to be a combination of pain intensity and pain affect, where pain affect describes the degree of emotional arousal or changes in action readiness caused by the sensory experience of pain. Factors such as social and work situation, setting, emotional state, anxiety levels, expectation, and prior pain experience may influence pain perception and show large inter-individual differences that may also be affected by time variables.  Patient instructions provided during this appointment: Patient Instructions   ____________________________________________________________________________________________  Medication Rules  Applies to: All patients receiving prescriptions (written or electronic).  Pharmacy of record: Pharmacy where electronic prescriptions will be sent. If written prescriptions are taken to a different pharmacy, please inform the nursing staff. The pharmacy listed in the electronic medical record should be the one where you would like electronic prescriptions to be sent.  Prescription refills: Only during scheduled appointments. Applies to both, written and electronic prescriptions.  NOTE: The following applies primarily to  controlled substances (Opioid* Pain Medications).   Patient's responsibilities: 1. Pain Pills: Bring all pain pills to every appointment (except for procedure appointments). 2. Pill Bottles: Bring pills in original pharmacy bottle. Always bring newest bottle. Bring bottle, even if empty. 3. Medication refills: You are responsible for knowing and keeping track of what medications you need refilled. The day before your appointment, write a list of all prescriptions that need to be refilled. Bring that list to your appointment and give it to the admitting nurse. Prescriptions will be written only during appointments. If you forget a medication, it will not be "Called in", "Faxed", or "electronically sent". You will need to get another appointment to get these prescribed. 4. Prescription Accuracy: You are responsible for carefully inspecting your prescriptions before leaving our office. Have the discharge nurse carefully go over each prescription with you, before taking them home. Make sure that your name is accurately spelled, that your address is correct. Check the name and dose of your medication to make sure it is accurate. Check the number of pills, and the written instructions to make sure they are clear and accurate. Make sure that you are given enough medication to last until your next medication refill appointment. 5. Taking Medication: Take medication as prescribed. Never take more pills than instructed. Never take medication more frequently than prescribed. Taking less pills or less frequently is permitted and encouraged, when it comes to controlled substances (written prescriptions).  6. Inform other Doctors: Always inform, all of your healthcare providers, of all the medications you take. 7. Pain Medication from other Providers: You are not allowed to accept any additional pain medication from any other Doctor or Healthcare provider. There are two exceptions to this rule. (  see below) In the event  that you require additional pain medication, you are responsible for notifying us, as stated below. 8. Medication Agreement: You are responsible for carefully reading and following our Medication Agreement. This must be signed before receiving any prescriptions from our practice. Safely store a copy of your signed Agreement. Violations to the Agreement will result in no further prescriptions. (Additional copies of our Medication Agreement are available upon request.) 9. Laws, Rules, & Regulations: All patients are expected to follow all Federal and [employer], [employer], Rules, [employer]. Ignorance of the Laws does not constitute a valid excuse. The use of any illegal substances is prohibited. 10. Adopted CDC guidelines & recommendations: Target dosing levels will be at or below 60 MME/day. Use of benzodiazepines** is not recommended.  Exceptions: There are only two exceptions to the rule of not receiving pain medications from other Healthcare Providers. 1. Exception #1 (Emergencies): In the event of an emergency (i.e.: accident requiring emergency care), you are allowed to receive additional pain medication. However, you are responsible for: As soon as you are able, call our office [phone], at any time of the day or night, and leave a message stating your name, the date and nature of the emergency, and the name and dose of the medication prescribed. In the event that your call is answered by a member of our staff, make sure to document and save the date, time, and the name of the person that took your information.  2. Exception #2 (Planned Surgery): In the event that you are scheduled by another doctor or dentist to have any type of surgery or procedure, you are allowed (for a period no longer than 30 days), to receive additional pain medication, for the acute post-op pain. However, in this case, you are responsible for picking up a copy of our "Post-op Pain Management for Surgeons" handout, and  giving it to your surgeon or dentist. This document is available at our office, and does not require an appointment to obtain it. Simply go to our office during business hours (Monday-Thursday from 8:00 AM to 4:00 PM) (Friday 8:00 AM to 12:00 Noon) or if you have a scheduled appointment with [place], prior to your surgery, and ask for it by name. In addition, you will need to provide [place] with your name, name of your surgeon, type of surgery, and date of procedure or surgery.  *Opioid medications include: morphine, codeine, oxycodone, oxymorphone, hydrocodone, hydromorphone, meperidine, tramadol, tapentadol, buprenorphine, fentanyl, methadone. **Benzodiazepine medications include: diazepam (Valium), alprazolam (Xanax), clonazepam (Klonopine), lorazepam (Ativan), clorazepate (Tranxene), chlordiazepoxide (Librium), estazolam (Prosom), oxazepam (Serax), temazepam (Restoril), triazolam (Halcion) (Last updated: 03/09/2017) ____________________________________________________________________________________________    BMI Assessment: Estimated body mass index is 45.6 kg/m as calculated from the following:   Height as of this encounter: _0  (1.651 m).   Weight as of this encounter: 274 lb (124.3 kg).  BMI interpretation table: BMI level Category Range association with higher incidence of chronic pain  <18 kg/m2 Underweight   18.5-24.9 kg/m2 Ideal body weight   25-29.9 kg/m2 Overweight Increased incidence by 20%  30-34.9 kg/m2 Obese (Class I) Increased incidence by 68%  35-39.9 kg/m2 Severe obesity (Class II) Increased incidence by 136%  >40 kg/m2 Extreme obesity (Class III) Increased incidence by 254%   BMI Readings from Last 4 Encounters:  04/13/17 45.60 kg/m  12/28/16 46.59 kg/m  10/10/16 44.93 kg/m  09/01/16 45.76 kg/m   Wt Readings from Last 4 Encounters:  04/13/17 274 lb (124.3 kg)  12/28/16 280 lb (127 kg)  10/10/16 270 lb (122.5 kg)  09/01/16 275 lb (124.7 kg)

## 2017-04-18 ENCOUNTER — Encounter: Payer: Self-pay | Admitting: Internal Medicine

## 2017-04-18 ENCOUNTER — Ambulatory Visit (INDEPENDENT_AMBULATORY_CARE_PROVIDER_SITE_OTHER): Payer: Medicare Other | Admitting: Internal Medicine

## 2017-04-18 VITALS — BP 145/90 | HR 82 | Resp 16 | Ht 65.0 in | Wt 278.6 lb

## 2017-04-18 DIAGNOSIS — Z9989 Dependence on other enabling machines and devices: Secondary | ICD-10-CM | POA: Diagnosis not present

## 2017-04-18 DIAGNOSIS — G4733 Obstructive sleep apnea (adult) (pediatric): Secondary | ICD-10-CM

## 2017-04-18 DIAGNOSIS — K219 Gastro-esophageal reflux disease without esophagitis: Secondary | ICD-10-CM

## 2017-04-18 DIAGNOSIS — N183 Chronic kidney disease, stage 3 unspecified: Secondary | ICD-10-CM

## 2017-04-18 DIAGNOSIS — E1122 Type 2 diabetes mellitus with diabetic chronic kidney disease: Secondary | ICD-10-CM

## 2017-04-18 NOTE — Patient Instructions (Signed)

## 2017-04-18 NOTE — Progress Notes (Signed)
[facility] [address] [place], [place] [address]  Pulmonary Sleep Medicine   Office Visit Note  Patient Name: Laura [name] DOB: 1945/02/21 MRN [id number]  Date of Service: 04/18/2017  Complaints/HPI:  She is doing fairly well she has been using her CPAP machine down the was reviewed and looks good.  She denies any issues or complications related to the CPAP.  She has been getting her supplies on a regular basis and has been adequate.  Today denies any nasal congestion no cough no congestion chest pain.  ROS  General: (-) fever, (-) chills, (-) night sweats, (-) weakness Skin: (-) rashes, (-) itching,. Eyes: (-) visual changes, (-) redness, (-) itching. Nose and Sinuses: (-) nasal stuffiness or itchiness, (-) postnasal drip, (-) nosebleeds, (-) sinus trouble. Mouth and Throat: (-) sore throat, (-) hoarseness. Neck: (-) swollen glands, (-) enlarged thyroid, (-) neck pain. Respiratory: - cough, (-) bloody sputum, - shortness of breath, - wheezing. Cardiovascular: - ankle swelling, (-) chest pain. Lymphatic: (-) lymph node enlargement. Neurologic: (-) numbness, (-) tingling. Psychiatric: (-) anxiety, (-) depression   Current Medication: Outpatient Encounter Medications as of 04/18/2017  Medication Sig  . amLODipine (NORVASC) 10 MG tablet Take 10 mg by mouth 2 (two) times daily.   Marland Kitchen aspirin EC 81 MG tablet Take by mouth.  . chlorthalidone (HYGROTON) 25 MG tablet TAKE 1/2 TABLET (12.5MG ) BY MOUTH EVERY DAY  . Cholecalciferol (VITAMIN D) 2000 UNITS tablet Take by mouth daily.   Marland Kitchen docusate sodium (COLACE) 100 MG capsule Take 100 mg by mouth 2 (two) times daily.  . DULoxetine (CYMBALTA) 30 MG capsule Two tablets in the morning, and one in the evening.  Marland Kitchen esomeprazole (NEXIUM) 20 MG capsule Take by mouth daily.   . folic acid (FOLVITE) 1 MG tablet Take 1 mg by mouth daily.  . furosemide (LASIX) 40 MG tablet TAKE 1 TABLET ONCE DAILY  . hydroxychloroquine (PLAQUENIL)  200 MG tablet Take 200 mg by mouth daily.   . InFLIXimab-dyyb (INFLECTRA) 100 MG SOLR Inject into the vein every 6 (six) weeks.   Marland Kitchen levothyroxine (SYNTHROID, LEVOTHROID) 200 MCG tablet TAKE 1 TABLET ONCE DAILY- ON AN EMPTY STOMACH WITH A GLASS OF WATER 30-60 MINUTES BEFORE BREAKFAST  . loratadine (CLARITIN) 10 MG tablet Take by mouth daily as needed.   . lubiprostone (AMITIZA) 24 MCG capsule Take 1 capsule (24 mcg total) by mouth 2 (two) times daily with a meal. Swallow the medication whole. Do not break or chew the medication.  . methotrexate (RHEUMATREX) 2.5 MG tablet TAKE 4 TABLETS (10 MG TOTAL) BY MOUTH EVERY 7 (SEVEN) DAYS WITH A MEAL  . metoprolol succinate (TOPROL-XL) 50 MG 24 hr tablet TAKE 1 TABLET (50 MG TOTAL) BY MOUTH ONCE DAILY.  . Multiple Vitamin (MULTI-VITAMINS) TABS Take by mouth.  [name] ON 06/19/2017] oxyCODONE (OXY IR/ROXICODONE) 5 MG immediate release tablet Take 1 tablet (5 mg total) by mouth every 6 (six) hours as needed for severe pain.  [name] ON 05/20/2017] oxyCODONE (OXY IR/ROXICODONE) 5 MG immediate release tablet Take 1 tablet (5 mg total) by mouth every 6 (six) hours as needed for severe pain.  [name] ON 04/20/2017] oxyCODONE (OXY IR/ROXICODONE) 5 MG immediate release tablet Take 1 tablet (5 mg total) by mouth every 6 (six) hours as needed for severe pain.  Marland Kitchen QUEtiapine (SEROQUEL) 200 MG tablet Take 1 tablet (200 mg total) by mouth at bedtime.  Marland Kitchen QUEtiapine (SEROQUEL) 50 MG tablet Take 1 tablet (50  mg total) by mouth 2 (two) times daily.  Marland Kitchen spironolactone (ALDACTONE) 25 MG tablet TAKE 1/2 (ONE-HALF) TABLET BY MOUTH DAILY  . pravastatin (PRAVACHOL) 10 MG tablet TAKE 1 TABLET BY MOUTH EVERY DAY AT NIGHT   No facility-administered encounter medications on file as of 04/18/2017.     Surgical History: Past Surgical History:  Procedure Laterality Date  . ABDOMINAL HYSTERECTOMY    . CESAREAN SECTION    . HIP SURGERY Right   . REPLACEMENT TOTAL KNEE Left     Medical  History: Past Medical History:  Diagnosis Date  . Allergy   . Anxiety   . Arthritis, degenerative 10/08/2013   Overview:    a.  Lumbar spine/spinal stenosis/foot drop.   b.  Hands.   . Chronic kidney disease   . Depression   . 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 Major depression, single episode, in complete remission (HCC) 06/25/2015  . Memory loss, short term 03/19/2014  . Seizure (HCC) 10/07/2014  . Sleep apnea   . Thyroid disease     Family History: Family History  Problem Relation Age of Onset  . Hypertension Mother   . Stroke Mother   . Heart attack Father   . Alcohol abuse Father   . Depression Father   . Post-traumatic stress disorder Father   . Rheum arthritis Sister   . Hypertension Sister   . Depression Sister     Social History: Social History   Socioeconomic History  . Marital status: Married    Spouse name: Not on file  . Number of children: Not on file  . Years of education: Not on file  . Highest education level: Not on file  Occupational History    Comment: retired  Engineer, production  . Financial resource strain: Not hard at all  . Food insecurity:    Worry: Never true    Inability: Never true  . Transportation needs:    Medical: No    Non-medical: No  Tobacco Use  . Smoking status: Never Smoker  . Smokeless tobacco: Never Used  Substance and Sexual Activity  . Alcohol use: No    Alcohol/week: 0.0 oz  . Drug use: No  . Sexual activity: Not Currently  Lifestyle  . Physical activity:    Days per week: 0 days    Minutes per session: 0 min  . Stress: Not at all  Relationships  . Social connections:    Talks on phone: More than three times a week    Gets together: Once a week    Attends religious service: Never    Active member of club or organization: No    Attends meetings of clubs or organizations: Never    Relationship status: Married  . Intimate partner violence:    Fear of current or ex partner: No     Emotionally abused: No    Physically abused: No    Forced sexual activity: No  Other Topics Concern  . Not on file  Social History Narrative  . Not on file    Vital Signs: Blood pressure (!) 145/90, pulse 82, resp. rate 16, height 5\' 5"  (1.651 m), weight 278 lb 9.6 oz (126.4 kg), SpO2 97 %.  Examination: General Appearance: The patient is well-developed, well-nourished, and in no distress. Skin: Gross inspection of skin unremarkable. Head: normocephalic, no gross deformities. Eyes: no gross deformities noted. ENT: ears appear grossly normal no exudates. Neck: Supple. No thyromegaly. No LAD. Respiratory: no rhonchi. Cardiovascular:  Normal S1 and S2 without murmur or rub. Extremities: No cyanosis. pulses are equal. Neurologic: Alert and oriented. No involuntary movements.  LABS: No results found for this or any previous visit (from the past 2160 hour(s)).  Radiology: Mm Screening Breast Tomo Bilateral  Result Date: 12/09/2016 CLINICAL DATA:  Screening. EXAM: 2D DIGITAL SCREENING BILATERAL MAMMOGRAM WITH CAD AND ADJUNCT TOMO COMPARISON:  Previous exam(s). ACR Breast Density Category b: There are scattered areas of fibroglandular density. FINDINGS: There are no findings suspicious for malignancy. Images were processed with CAD. IMPRESSION: No mammographic evidence of malignancy. A result letter of this screening mammogram will be mailed directly to the patient. RECOMMENDATION: Screening mammogram in one year. (Code:SM-B-01Y) BI-RADS CATEGORY  1: Negative. Electronically Signed   By: Frederico Hamman M.D.   On: 12/09/2016 10:03    No results found.  No results found.    Assessment and Plan: Patient Active Problem List   Diagnosis Date Noted  . Postmenopausal 04/06/2017  . Chronic arthralgias of knees and hips (Right) 08/18/2016  . Arthritis 07/11/2016  . Dyspnea on exertion 07/11/2016  . Kidney disease 07/11/2016  . 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 Snoring  07/11/2016  . Morbid obesity with BMI of 40.0-44.9, adult (HCC) 07/11/2016  . Opioid-induced constipation (OIC) 04/21/2016  . Osteoarthritis of knee (Right) 01/25/2016  . Diet-controlled diabetes mellitus (HCC) 10/03/2015  . GAD (generalized anxiety disorder) 09/24/2015  . Disturbance of skin sensation 07/15/2015  . Elevated sedimentation rate 07/15/2015  . Elevated C-reactive protein (CRP) 07/15/2015  . Chronic hip pain (Left) 04/06/2015  . History of TKR (total knee replacement) (Left) 02/09/2015  . History of femur fracture (Right) 02/09/2015  . 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 Lumbar foraminal stenosis (Bilateral L3-4 and L5-S1) 02/09/2015  . Chronic low back pain (Location of Primary Source of Pain) (Bilateral) (L>R) 01/22/2015  . Chronic pain syndrome 11/25/2014  . Opioid dependence (HCC) 11/07/2014  . Lumbar spinal stenosis (5 mm Severe L3-4; 8 mm L4-5) 11/07/2014  . Lumbar spondylosis 11/07/2014  . Lumbar facet syndrome (Location of Primary Source of Pain) (Bilateral) (L>R) 11/07/2014  . Chronic knee pain (Right) 11/05/2014  . Opiate use (30 MME/Day) 11/05/2014  . Long term current use of opiate analgesic 11/05/2014  . Long term prescription opiate use 11/05/2014  . Encounter for therapeutic drug level monitoring 11/05/2014  . Morbid obesity (HCC) 10/27/2014  . Extreme obesity (HCC) 10/07/2014  . Type 2 diabetes mellitus (HCC) 03/19/2014  . Depressive disorder 10/14/2013  . Essential (primary) hypertension 10/14/2013  . Insomnia secondary to chronic pain 07/09/2013  . Anxiety 07/09/2013  . GERD (gastroesophageal reflux disease) 01/18/2013  . Adult hypothyroidism 01/18/2013  . Hypercholesterolemia 01/18/2013  . 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 CKD (chronic kidney disease) stage 3, GFR 30-59 ml/min (HCC) 01/17/2013  . Congestive heart failure with left ventricular systolic  dysfunction (HCC) 11/10/2012    1. OSA  On CPAP will continue with full supportive care on the current settings. 2. Morbid Obesity  Strongly recommended weight loss once again she needs to work on this 3. GERD controlled at this time 4. CKD stage 3 followed by Nephrology we will continue to monitor labs  General Counseling: I have discussed the findings of the evaluation and examination with Laura [name].  I have also discussed any further diagnostic evaluation thatmay be needed or ordered today. Laura [name] verbalizes understanding of the findings of todays visit. We also reviewed her  medications today and discussed drug interactions and side effects including but not limited excessive drowsiness and altered mental states. We also discussed that there is always a risk not just to her but also people around her. she has been encouraged to call the office with any questions or concerns that should arise related to todays visit.    Time spent: 54mn  I have personally obtained a history, examined the patient, evaluated laboratory and imaging results, formulated the assessment and plan and placed orders.    SAllyne Gee MD FAntelope Valley Surgery Center LPPulmonary and Critical Care Sleep medicine

## 2017-04-19 ENCOUNTER — Telehealth: Payer: Self-pay | Admitting: Internal Medicine

## 2017-04-19 LAB — TOXASSURE SELECT 13 (MW), URINE

## 2017-04-19 NOTE — Telephone Encounter (Signed)
Faxed [facility] cpap order for supplies/ [employer]

## 2017-04-20 ENCOUNTER — Ambulatory Visit: Payer: Medicare Other | Admitting: Psychiatry

## 2017-04-20 ENCOUNTER — Telehealth: Payer: Self-pay

## 2017-04-20 ENCOUNTER — Telehealth: Payer: Self-pay | Admitting: Psychiatry

## 2017-04-20 MED ORDER — DULOXETINE HCL 30 MG PO CPEP: ORAL_CAPSULE | ORAL | 0 refills | Status: DC

## 2017-04-20 MED ORDER — QUETIAPINE FUMARATE 50 MG PO TABS: 50.0000 mg | ORAL_TABLET | Freq: Two times a day (BID) | ORAL | 0 refills | Status: DC

## 2017-04-20 MED ORDER — QUETIAPINE FUMARATE 200 MG PO TABS: 200.0000 mg | ORAL_TABLET | Freq: Every day | ORAL | 0 refills | Status: DC

## 2017-04-20 NOTE — Telephone Encounter (Signed)
Sent seroquel and cymbalta to pharmacy. seroquel 90 days and cymbalta 30 days

## 2017-04-20 NOTE — Telephone Encounter (Signed)
had to call and r/s pt from today because dr. Daleen Bo was out . pt will not have enough medication to due until her r/s appt for 04-27-17. can you send in enough medication to due until that date.?

## 2017-04-25 NOTE — Telephone Encounter (Signed)
Thank you :)

## 2017-04-27 ENCOUNTER — Ambulatory Visit (INDEPENDENT_AMBULATORY_CARE_PROVIDER_SITE_OTHER): Payer: Medicare Other | Admitting: Psychiatry

## 2017-04-27 ENCOUNTER — Encounter: Payer: Self-pay | Admitting: Psychiatry

## 2017-04-27 ENCOUNTER — Other Ambulatory Visit: Payer: Self-pay

## 2017-04-27 VITALS — BP 130/87 | HR 103 | Temp 98.3°F | Wt 271.0 lb

## 2017-04-27 DIAGNOSIS — F5101 Primary insomnia: Secondary | ICD-10-CM | POA: Diagnosis not present

## 2017-04-27 DIAGNOSIS — F3340 Major depressive disorder, recurrent, in remission, unspecified: Secondary | ICD-10-CM

## 2017-04-27 NOTE — Progress Notes (Signed)
Patient ID: Laura [name], female   DOB: Aug 09, 1945, 72 y.o.   MRN: [id number]  [facility] MD/PA/NP OP Progress Note  04/27/2017 2:41 PM Janica Eldred  MRN:  [id number]  Subjective:  Patient is a 72 year old Caucasian female with a long history of depression and insomnia. Patient reports today that she has recently been sick with the flu. States that mood wise she is doing well but continues to feel very weak. Reports she ran out of the Seroquel a week ago and though it was called in to pharmacy did not fill it. We looked at the medication log and Dr. Mariam Dollar had prescribed on her medications on April 11. Patient will check with the pharmacy. Sleeping well and eating well. Denies any problems. Visit Diagnosis:     ICD-10-CM   1. Recurrent major depressive disorder, in remission (HCC) F33.40   2. 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 Allergy   . Anxiety   . Arthritis, degenerative 10/08/2013   Overview:    a.  Lumbar spine/spinal stenosis/foot drop.   b.  Hands.   . Chronic kidney disease   . Depression   . 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 Major depression, single episode, in complete remission (HCC) 06/25/2015  . Memory loss, short term 03/19/2014  . Seizure (HCC) 10/07/2014  . Sleep apnea   . Thyroid disease     Past Surgical History:  Procedure Laterality Date  . ABDOMINAL HYSTERECTOMY    . CESAREAN SECTION    . HIP SURGERY Right   . REPLACEMENT TOTAL KNEE Left    Family History:  Family History  Problem Relation Age of Onset  . Hypertension Mother   . Stroke Mother   . Heart attack Father   . Alcohol abuse Father   . Depression Father   . Post-traumatic stress disorder Father   . Rheum arthritis Sister   . Hypertension Sister   . Depression Sister    Social History:  Social History   Socioeconomic History  . Marital status: Married    Spouse name: Not on file  . Number of children:  Not on file  . Years of education: Not on file  . Highest education level: Not on file  Occupational History    Comment: retired  Engineer, production  . Financial resource strain: Not hard at all  . Food insecurity:    Worry: Never true    Inability: Never true  . Transportation needs:    Medical: No    Non-medical: No  Tobacco Use  . Smoking status: Never Smoker  . Smokeless tobacco: Never Used  Substance and Sexual Activity  . Alcohol use: No    Alcohol/week: 0.0 oz  . Drug use: No  . Sexual activity: Not Currently  Lifestyle  . Physical activity:    Days per week: 0 days    Minutes per session: 0 min  . Stress: Not at all  Relationships  . Social connections:    Talks on phone: More than three times a week    Gets together: Once a week    Attends religious service: Never    Active member of club or organization: No    Attends meetings of clubs or organizations: Never    Relationship status: Married  Other Topics Concern  . Not on file  Social History Narrative  . Not on file   Additional History:   Assessment:  Musculoskeletal: Strength & Muscle Tone: within normal limits Gait & Station: normal Patient leans: N/A  Psychiatric Specialty Exam: Medication Refill     Review of Systems  Psychiatric/Behavioral: Negative for depression, hallucinations, memory loss, substance abuse and suicidal ideas. The patient is not nervous/anxious and does not have insomnia (Avis complaints have improved and she attributes it to  Seroquel).   All other systems reviewed and are negative.    There were no vitals taken for this visit.There is no height or weight on file to calculate BMI.  General Appearance: Well Groomed  Eye Contact:  Good  Speech:  Normal Rate  Volume:  Normal  Mood:  Good  Affect:  Congruent  Thought Process:  Linear and Logical  Orientation:  Full (Time, Place, and Person)  Thought Content:  Negative  Suicidal Thoughts:  No  Homicidal Thoughts:  No   Memory:  Immediate;   Good Recent;   Good Remote;   Good  Judgement:  Good  Insight:  Good  Psychomotor Activity:  Negative  Concentration:  Good  Recall:  Good  Fund of Knowledge: Good  Language: Good  Akathisia:  Negative  Handed:    AIMS (if indicated):  N/A  Assets:  Communication Skills Desire for Improvement Social Support  ADL's:  Intact  Cognition: WNL  Sleep:  fair   Is the patient at risk to self?  No. Has the patient been a risk to self in the past 6 months?  No. Has the patient been a risk to self within the distant past?  No. Is the patient a risk to others?  No. Has the patient been a risk to others in the past 6 months?  No. Has the patient been a risk to others within the distant past?  No.  Current Medications: Current Outpatient Medications  Medication Sig Dispense Refill  . amLODipine (NORVASC) 10 MG tablet Take 10 mg by mouth 2 (two) times daily.   11  . aspirin EC 81 MG tablet Take by mouth.    . chlorthalidone (HYGROTON) 25 MG tablet TAKE 1/2 TABLET (12.5MG ) BY MOUTH EVERY DAY  11  . Cholecalciferol (VITAMIN D) 2000 UNITS tablet Take by mouth daily.     Marland Kitchen docusate sodium (COLACE) 100 MG capsule Take 100 mg by mouth 2 (two) times daily.    . DULoxetine (CYMBALTA) 30 MG capsule Two tablets in the morning, and one in the evening. 90 capsule 0  . esomeprazole (NEXIUM) 20 MG capsule Take by mouth daily.     . folic acid (FOLVITE) 1 MG tablet Take 1 mg by mouth daily.    . furosemide (LASIX) 40 MG tablet TAKE 1 TABLET ONCE DAILY    . hydroxychloroquine (PLAQUENIL) 200 MG tablet Take 200 mg by mouth daily.     . InFLIXimab-dyyb (INFLECTRA) 100 MG SOLR Inject into the vein every 6 (six) weeks.     Marland Kitchen levothyroxine (SYNTHROID, LEVOTHROID) 200 MCG tablet TAKE 1 TABLET ONCE DAILY- ON AN EMPTY STOMACH WITH A GLASS OF WATER 30-60 MINUTES BEFORE BREAKFAST  5  . loratadine (CLARITIN) 10 MG tablet Take by mouth daily as needed.     . lubiprostone (AMITIZA) 24 MCG  capsule Take 1 capsule (24 mcg total) by mouth 2 (two) times daily with a meal. Swallow the medication whole. Do not break or chew the medication. 180 capsule 0  . methotrexate (RHEUMATREX) 2.5 MG tablet TAKE 4 TABLETS (10 MG TOTAL) BY MOUTH EVERY 7 (SEVEN) DAYS WITH A  MEAL  5  . metoprolol succinate (TOPROL-XL) 50 MG 24 hr tablet TAKE 1 TABLET (50 MG TOTAL) BY MOUTH ONCE DAILY.  1  . Multiple Vitamin (MULTI-VITAMINS) TABS Take by mouth.    [name] ON 06/19/2017] oxyCODONE (OXY IR/ROXICODONE) 5 MG immediate release tablet Take 1 tablet (5 mg total) by mouth every 6 (six) hours as needed for severe pain. 120 tablet 0  . [START ON 05/20/2017] oxyCODONE (OXY IR/ROXICODONE) 5 MG immediate release tablet Take 1 tablet (5 mg total) by mouth every 6 (six) hours as needed for severe pain. 120 tablet 0  . oxyCODONE (OXY IR/ROXICODONE) 5 MG immediate release tablet Take 1 tablet (5 mg total) by mouth every 6 (six) hours as needed for severe pain. 120 tablet 0  . pravastatin (PRAVACHOL) 10 MG tablet TAKE 1 TABLET BY MOUTH EVERY DAY AT NIGHT  5  . QUEtiapine (SEROQUEL) 200 MG tablet Take 1 tablet (200 mg total) by mouth at bedtime. 90 tablet 0  . QUEtiapine (SEROQUEL) 50 MG tablet Take 1 tablet (50 mg total) by mouth 2 (two) times daily. 180 tablet 0  . spironolactone (ALDACTONE) 25 MG tablet TAKE 1/2 (ONE-HALF) TABLET BY MOUTH DAILY  11   No current facility-administered medications for this visit.     Medical Decision Making:  Established Problem, Stable/Improving (1), Review of Medication Regimen & Side Effects (2) and Review of New Medication or Change in Dosage (2)  Treatment Plan Summary:Medication management and Plan    Major depressive disorder, recurrent, moderate-patient will continue her Cymbalta 60 mg in the morning and 30 mg in the evening.   Insomnia- Continue Seroquel at 200 mg at bedtime.   Anxiety- take Seroquel at 50 mg twice daily. If she gets too sedated in the morning to shift change  that to bedtime.  Labs- High cholesterol 277, high triglycerides at 257, LDL 177, HgA1C 6.5  RTC to clinic in 3 months time or call before if necessary.  Karyss Frese 04/27/2017, 2:41 PM

## 2017-04-28 ENCOUNTER — Telehealth: Payer: Self-pay

## 2017-04-28 NOTE — Telephone Encounter (Signed)
received a fax requesting a 90 day supply of duloxetine hcl dr. 30mg  per pt insurance it has to be a 90 day supply    Disp Refills Start End   DULoxetine (CYMBALTA) 30 MG capsule 90 capsule 0 04/20/2017    Sig: Two tablets in the morning, and one in the evening.   Sent to pharmacy as: DULoxetine (CYMBALTA) 30 MG capsule   E-Prescribing Status: Receipt confirmed by pharmacy (04/20/2017 3:34 PM EDT)    Pt last seen dr. 06/20/2017 on  04-27-17 next appt 08-03-17

## 2017-05-01 MED ORDER — DULOXETINE HCL 30 MG PO CPEP
ORAL_CAPSULE | ORAL | 0 refills | Status: DC
Start: 2017-05-01 — End: 2017-08-01

## 2017-05-01 NOTE — Telephone Encounter (Signed)
Sent it

## 2017-05-01 NOTE — Telephone Encounter (Signed)
received a request for the duloxetine hcl 30mg  needs to be a 90 day supply per insurance

## 2017-05-09 NOTE — Telephone Encounter (Signed)
Thank you :)

## 2017-05-23 ENCOUNTER — Other Ambulatory Visit: Payer: Self-pay

## 2017-05-23 ENCOUNTER — Emergency Department: Payer: Medicare Other

## 2017-05-23 ENCOUNTER — Emergency Department
Admission: EM | Admit: 2017-05-23 | Discharge: 2017-05-24 | Disposition: A | Discharge status: Home / Self Care | Payer: Medicare Other | Attending: Emergency Medicine | Admitting: Emergency Medicine

## 2017-05-23 DIAGNOSIS — Z7982 Long term (current) use of aspirin: Secondary | ICD-10-CM | POA: Insufficient documentation

## 2017-05-23 DIAGNOSIS — I130 Hypertensive heart and chronic kidney disease with heart failure and stage 1 through stage 4 chronic kidney disease, or unspecified chronic kidney disease: Secondary | ICD-10-CM | POA: Diagnosis not present

## 2017-05-23 DIAGNOSIS — Z96652 Presence of left artificial knee joint: Secondary | ICD-10-CM | POA: Insufficient documentation

## 2017-05-23 DIAGNOSIS — N183 Chronic kidney disease, stage 3 (moderate): Secondary | ICD-10-CM | POA: Insufficient documentation

## 2017-05-23 DIAGNOSIS — Z79899 Other long term (current) drug therapy: Secondary | ICD-10-CM | POA: Insufficient documentation

## 2017-05-23 DIAGNOSIS — J208 Acute bronchitis due to other specified organisms: Secondary | ICD-10-CM | POA: Diagnosis not present

## 2017-05-23 DIAGNOSIS — E1122 Type 2 diabetes mellitus with diabetic chronic kidney disease: Secondary | ICD-10-CM | POA: Diagnosis not present

## 2017-05-23 DIAGNOSIS — R0602 Shortness of breath: Secondary | ICD-10-CM | POA: Diagnosis present

## 2017-05-23 DIAGNOSIS — E039 Hypothyroidism, unspecified: Secondary | ICD-10-CM | POA: Diagnosis not present

## 2017-05-23 DIAGNOSIS — I5020 Unspecified systolic (congestive) heart failure: Secondary | ICD-10-CM | POA: Diagnosis not present

## 2017-05-23 LAB — CBC
HCT: 43 % (ref 35.0–47.0)
Hemoglobin: 14.7 g/dL (ref 12.0–16.0)
MCH: 31.4 pg (ref 26.0–34.0)
MCHC: 34.1 g/dL (ref 32.0–36.0)
MCV: 92.3 fL (ref 80.0–100.0)
Platelets: 305 10*3/uL (ref 150–440)
RBC: 4.66 MIL/uL (ref 3.80–5.20)
RDW: 16 % — ABNORMAL HIGH (ref 11.5–14.5)
WBC: 7.9 10*3/uL (ref 3.6–11.0)

## 2017-05-23 LAB — BASIC METABOLIC PANEL
Anion gap: 13 (ref 5–15)
BUN: 36 mg/dL — ABNORMAL HIGH (ref 6–20)
CO2: 34 mmol/L — ABNORMAL HIGH (ref 22–32)
Calcium: 9.1 mg/dL (ref 8.9–10.3)
Chloride: 87 mmol/L — ABNORMAL LOW (ref 101–111)
Creatinine, Ser: 1.63 mg/dL — ABNORMAL HIGH (ref 0.44–1.00)
GFR calc Af Amer: 35 mL/min — ABNORMAL LOW (ref >60)
GFR calc non Af Amer: 30 mL/min — ABNORMAL LOW (ref >60)
Glucose, Bld: 145 mg/dL — ABNORMAL HIGH (ref 65–99)
Potassium: 2.8 mmol/L — ABNORMAL LOW (ref 3.5–5.1)
Sodium: 134 mmol/L — ABNORMAL LOW (ref 135–145)

## 2017-05-23 LAB — TROPONIN I: Troponin I: <0.03 ng/mL (ref <0.03)

## 2017-05-23 IMAGING — DX DG CHEST 1V PORT
1 series · 1 of 1 positions shown · non-contrast
Comparison: None.

CLINICAL DATA: c/o congestion, weakness since [REDACTED].

EXAM:
PORTABLE CHEST 1 VIEW

[chest ap]
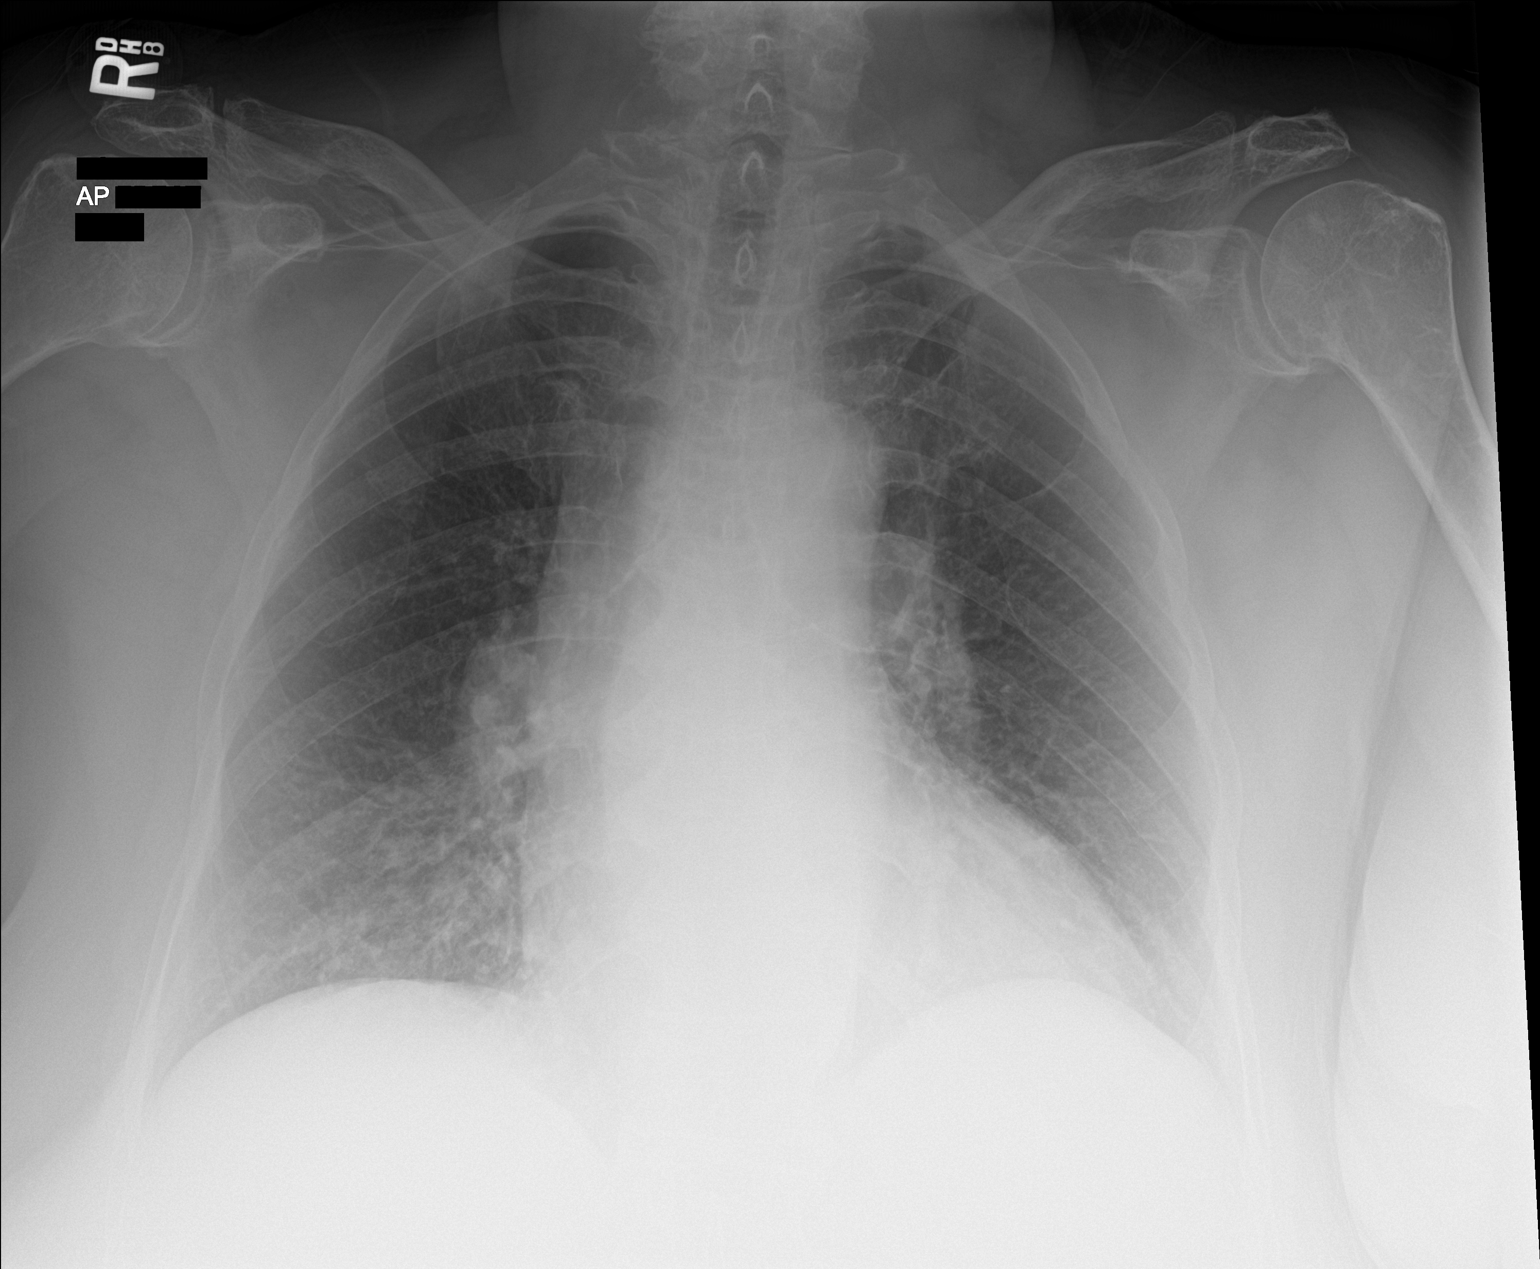

[1 of 1 positions shown; findings below may reference images not displayed]

FINDINGS: Borderline cardiomegaly. Lungs are clear. No pleural effusion or
pneumothorax seen. No acute or suspicious osseous finding.
IMPRESSION: No active disease. No evidence of pneumonia or pulmonary edema.
Borderline cardiomegaly.

## 2017-05-23 MED ORDER — IPRATROPIUM-ALBUTEROL 0.5-2.5 (3) MG/3ML IN SOLN
3.0000 mL | Freq: Once | RESPIRATORY_TRACT | Status: AC
  Administered 2017-05-23: 3 mL via RESPIRATORY_TRACT
  Filled 2017-05-23: qty 3

## 2017-05-23 NOTE — ED Notes (Signed)
Lab notified to add on BNP 

## 2017-05-23 NOTE — ED Provider Notes (Signed)
[facility] Emergency Department Provider Note  ____________________________________________   First MD Initiated Contact with Patient 05/23/17 2313     (approximate)  I have reviewed the triage vital signs and the nursing notes.   HISTORY  Chief Complaint Shortness of Breath   HPI Laura [name] is a 72 y.o. female who comes to the emergency department with several days of cough and congestion.  Cough is nonproductive.  She feels generally "tired.  Today she went to urgent care and had a chest x-ray and was told that she might have "heart failure" and was sent to the emergency department.  She sleeps on 1-2 pillows.  She does not wake at night short of breath.  She has no history of myocardial infarction.  She has had no fevers or chills.  No sick contacts.  Her symptoms have been gradual onset slowly progressive seem to be somewhat worse at night and somewhat improved throughout the day.  She feels generally well.  No leg swelling.  No history of DVT or pulmonary embolism.  Past Medical History:  Diagnosis Date  . Allergy   . Anxiety   . Arthritis, degenerative 10/08/2013   Overview:    a.  Lumbar spine/spinal stenosis/foot drop.   b.  Hands.   . Chronic kidney disease   . Depression   . 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 Major depression, single episode, in complete remission (HCC) 06/25/2015  . Memory loss, short term 03/19/2014  . Seizure (HCC) 10/07/2014  . Sleep apnea   . Thyroid disease     Patient Active Problem List   Diagnosis Date Noted  . Postmenopausal 04/06/2017  . Chronic arthralgias of knees and hips (Right) 08/18/2016  . Arthritis 07/11/2016  . Dyspnea on exertion 07/11/2016  . Kidney disease 07/11/2016  . 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 Snoring 07/11/2016  . Morbid obesity with BMI of 40.0-44.9, adult (HCC) 07/11/2016  . Opioid-induced constipation (OIC) 04/21/2016  . Osteoarthritis of  knee (Right) 01/25/2016  . Diet-controlled diabetes mellitus (HCC) 10/03/2015  . GAD (generalized anxiety disorder) 09/24/2015  . Disturbance of skin sensation 07/15/2015  . Elevated sedimentation rate 07/15/2015  . Elevated C-reactive protein (CRP) 07/15/2015  . Chronic hip pain (Left) 04/06/2015  . History of TKR (total knee replacement) (Left) 02/09/2015  . History of femur fracture (Right) 02/09/2015  . 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 Lumbar foraminal stenosis (Bilateral L3-4 and L5-S1) 02/09/2015  . Chronic low back pain (Location of Primary Source of Pain) (Bilateral) (L>R) 01/22/2015  . Chronic pain syndrome 11/25/2014  . Opioid dependence (HCC) 11/07/2014  . Lumbar spinal stenosis (5 mm Severe L3-4; 8 mm L4-5) 11/07/2014  . Lumbar spondylosis 11/07/2014  . Lumbar facet syndrome (Location of Primary Source of Pain) (Bilateral) (L>R) 11/07/2014  . Chronic knee pain (Right) 11/05/2014  . Opiate use (30 MME/Day) 11/05/2014  . Long term current use of opiate analgesic 11/05/2014  . Long term prescription opiate use 11/05/2014  . Encounter for therapeutic drug level monitoring 11/05/2014  . Morbid obesity (HCC) 10/27/2014  . Extreme obesity (HCC) 10/07/2014  . Type 2 diabetes mellitus (HCC) 03/19/2014  . Depressive disorder 10/14/2013  . Essential (primary) hypertension 10/14/2013  . Insomnia secondary to chronic pain 07/09/2013  . Anxiety 07/09/2013  . GERD (gastroesophageal reflux disease) 01/18/2013  . Adult hypothyroidism 01/18/2013  . Hypercholesterolemia 01/18/2013  . 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  CKD (chronic kidney disease) stage 3, GFR 30-59 ml/min (HCC) 01/17/2013  . Congestive heart failure with left ventricular systolic dysfunction (HCC) 11/10/2012    Past Surgical History:  Procedure Laterality Date  . ABDOMINAL HYSTERECTOMY    . CESAREAN SECTION    . HIP  SURGERY Right   . REPLACEMENT TOTAL KNEE Left     Prior to Admission medications   Medication Sig Start Date End Date Taking? Authorizing Provider  amLODipine (NORVASC) 10 MG tablet Take 10 mg by mouth 2 (two) times daily.  09/29/14   [provider]  aspirin EC 81 MG tablet Take by mouth.    [provider]  benzonatate (TESSALON PERLES) 100 MG capsule Take 1 capsule (100 mg total) by mouth every 6 (six) hours as needed for cough. 05/24/17 05/24/18  Merrily Brittle, MD  chlorthalidone (HYGROTON) 25 MG tablet TAKE 1/2 TABLET (12.5MG ) BY MOUTH EVERY DAY 04/06/16   [provider]  Cholecalciferol (VITAMIN D) 2000 UNITS tablet Take by mouth daily.     [provider]  docusate sodium (COLACE) 100 MG capsule Take 100 mg by mouth 2 (two) times daily.    [provider]  DULoxetine (CYMBALTA) 30 MG capsule Two tablets in the morning, and one in the evening. 05/01/17   Jomarie Longs, MD  esomeprazole (NEXIUM) 20 MG capsule Take by mouth daily.     [provider]  folic acid (FOLVITE) 1 MG tablet Take 1 mg by mouth daily.    [provider]  furosemide (LASIX) 40 MG tablet TAKE 1 TABLET ONCE DAILY 09/16/14   [provider]  hydroxychloroquine (PLAQUENIL) 200 MG tablet Take 200 mg by mouth daily.     [provider]  InFLIXimab-dyyb (INFLECTRA) 100 MG SOLR Inject into the vein every 6 (six) weeks.     [provider]  levothyroxine (SYNTHROID, LEVOTHROID) 200 MCG tablet TAKE 1 TABLET ONCE DAILY- ON AN EMPTY STOMACH WITH A GLASS OF WATER 30-60 MINUTES BEFORE BREAKFAST 09/09/14   [provider]  loratadine (CLARITIN) 10 MG tablet Take by mouth daily as needed.  04/05/14   [provider]  lubiprostone (AMITIZA) 24 MCG capsule Take 1 capsule (24 mcg total) by mouth 2 (two) times daily with a meal. Swallow the medication whole. Do not break or chew the medication. 04/13/17 07/12/17  Barbette Merino, NP    methotrexate (RHEUMATREX) 2.5 MG tablet TAKE 4 TABLETS (10 MG TOTAL) BY MOUTH EVERY 7 (SEVEN) DAYS WITH A MEAL 03/15/16   [provider]  metoprolol succinate (TOPROL-XL) 50 MG 24 hr tablet TAKE 1 TABLET (50 MG TOTAL) BY MOUTH ONCE DAILY. 07/29/14   [provider]  Multiple Vitamin (MULTI-VITAMINS) TABS Take by mouth.    [provider]  oxyCODONE (OXY IR/ROXICODONE) 5 MG immediate release tablet Take 1 tablet (5 mg total) by mouth every 6 (six) hours as needed for severe pain. 06/19/17 07/19/17  Barbette Merino, NP  oxyCODONE (OXY IR/ROXICODONE) 5 MG immediate release tablet Take 1 tablet (5 mg total) by mouth every 6 (six) hours as needed for severe pain. 05/20/17 06/19/17  Barbette Merino, NP  oxyCODONE (OXY IR/ROXICODONE) 5 MG immediate release tablet Take 1 tablet (5 mg total) by mouth every 6 (six) hours as needed for severe pain. 04/20/17 05/20/17  Barbette Merino, NP  pravastatin (PRAVACHOL) 10 MG tablet TAKE 1 TABLET BY MOUTH EVERY DAY AT NIGHT 04/06/17   [provider]  QUEtiapine (SEROQUEL) 200 MG  tablet Take 1 tablet (200 mg total) by mouth at bedtime. 04/20/17   Jomarie Longs, MD  QUEtiapine (SEROQUEL) 50 MG tablet Take 1 tablet (50 mg total) by mouth 2 (two) times daily. 04/20/17   Jomarie Longs, MD  Spacer/Aero Chamber Mouthpiece MISC 1 Units by Does not apply route every 4 (four) hours as needed (wheezing). 05/24/17   Merrily Brittle, MD  spironolactone (ALDACTONE) 25 MG tablet TAKE 1/2 (ONE-HALF) TABLET BY MOUTH DAILY 09/12/14   [provider]    Allergies Bupropion and Penicillins  Family History  Problem Relation Age of Onset  . Hypertension Mother   . Stroke Mother   . Heart attack Father   . Alcohol abuse Father   . Depression Father   . Post-traumatic stress disorder Father   . Rheum arthritis Sister   . Hypertension Sister   . Depression Sister     Social History Social History   Tobacco Use  . Smoking status: Never  Smoker  . Smokeless tobacco: Never Used  Substance Use Topics  . Alcohol use: No    Alcohol/week: 0.0 oz  . Drug use: No    Review of Systems Constitutional: No fever/chills Eyes: No visual changes. ENT: Positive for sore throat. Cardiovascular: Denies chest pain. Respiratory: Positive for cough Gastrointestinal: No abdominal pain.  No nausea, no vomiting.  No diarrhea.  No constipation. Genitourinary: Negative for dysuria. Musculoskeletal: Negative for back pain. Skin: Negative for rash. Neurological: Negative for headaches, focal weakness or numbness.   ____________________________________________   PHYSICAL EXAM:  VITAL SIGNS: ED Triage Vitals [05/23/17 1751]  Enc Vitals Group     BP (!) 134/93     Pulse Rate (!) 105     Resp 18     Temp 98.3 F (36.8 C)     Temp Source Oral     SpO2 100 %     Weight 270 lb (122.5 kg)     Height      Head Circumference      Peak Flow      Pain Score 0     Pain Loc      Pain Edu?      Excl. in GC?     Constitutional: Alert and oriented x4 joking laughing well-appearing nontoxic no diaphoresis speaks full clear sentences Eyes: PERRL EOMI. Head: Atraumatic. Nose: No congestion/rhinnorhea. Mouth/Throat: No trismus Neck: No stridor.  Able lie completely flat with no JVD Cardiovascular: Normal rate, regular rhythm. Grossly normal heart sounds.  Good peripheral circulation. Respiratory: Normal respiratory effort.  No retractions.  Expiratory wheeze in all fields although moving good air Gastrointestinal: Soft nontender Musculoskeletal: No lower extremity edema legs equal in size Neurologic:  Normal speech and language. No gross focal neurologic deficits are appreciated. Skin:  Skin is warm, dry and intact. No rash noted. Psychiatric: Mood and affect are normal. Speech and behavior are normal.    ____________________________________________   DIFFERENTIAL includes but not limited to  Pulmonary edema, pneumonia, influenza,  bronchitis ____________________________________________   LABS (all labs ordered are listed, but only abnormal results are displayed)  Labs Reviewed  BASIC METABOLIC PANEL - Abnormal; Notable for the following components:      Result Value   Sodium 134 (*)    Potassium 2.8 (*)    Chloride 87 (*)    CO2 34 (*)    Glucose, Bld 145 (*)    BUN 36 (*)    Creatinine, Ser 1.63 (*)    GFR calc non Af  Amer 30 (*)    GFR calc Af Amer 35 (*)    All other components within normal limits  CBC - Abnormal; Notable for the following components:   RDW 16.0 (*)    All other components within normal limits  TROPONIN I  BRAIN NATRIURETIC PEPTIDE    Lab work reviewed by me with no signs of and no signs of acute ischemia __________________________________________  EKG  ED ECG REPORT I, Merrily Brittle, the attending physician, personally viewed and interpreted this ECG.  Date: 05/23/2017 EKG Time:  Rate: 90 Rhythm: normal sinus rhythm QRS Axis: leftward Intervals: normal ST/T Wave abnormalities: lateral T wave flattening Narrative Interpretation: no evidence of acute ischemia  ____________________________________________  RADIOLOGY  Chest x-ray reviewed by me with mild cardiomegaly otherwise unremarkable ____________________________________________   PROCEDURES  Procedure(s) performed: no  Procedures  Critical Care performed: no  Observation: no ____________________________________________   INITIAL IMPRESSION / ASSESSMENT AND PLAN / ED COURSE  Pertinent labs & imaging results that were available during my care of the patient were reviewed by me and considered in my medical decision making (see chart for details).  The patient is able to lie completely flat although does have some wheezing throughout and bedside ultrasound shows possibly mild decreased squeeze.  Repeat chest x-ray is pending as I am unable to see the report or the images from [place] [facility].      ----------------------------------------- 12:07 AM on 05/24/2017 -----------------------------------------  After breathing treatment the patient's symptoms are nearly completely resolved.  X-ray shows mild cardiomegaly but no fluid overload.  She feels improved after breathing treatment.  I had a lengthy discussion with the patient regarding the predicted clinical course and she and her husband verbalized understanding and agreement with discharge. ____________________________________________   FINAL CLINICAL IMPRESSION(S) / ED DIAGNOSES  Final diagnoses:  Viral bronchitis      NEW MEDICATIONS STARTED DURING THIS VISIT:  Discharge Medication List as of 05/24/2017 12:04 AM    START taking these medications   Details  benzonatate (TESSALON PERLES) 100 MG capsule Take 1 capsule (100 mg total) by mouth every 6 (six) hours as needed for cough., Starting Wed 05/24/2017, Until Thu 05/24/2018, Print    Spacer/Aero Chamber Mouthpiece MISC 1 Units by Does not apply route every 4 (four) hours as needed (wheezing)., Starting Wed 05/24/2017, Print         Note:  This document was prepared using Dragon voice recognition software and may include unintentional dictation errors.     Merrily Brittle, MD 05/24/17 2224

## 2017-05-23 NOTE — ED Triage Notes (Signed)
To ER via POV c/o congestion, weakness since Saturday. Pt states that she had chest xray and was told that she had enlarged heart. Denies CP. Pt alert and oriented X4, active, cooperative, pt in NAD. RR even and unlabored, color WNL.

## 2017-05-24 LAB — BRAIN NATRIURETIC PEPTIDE: B Natriuretic Peptide: 42 pg/mL (ref 0.0–100.0)

## 2017-05-24 MED ORDER — BENZONATATE 100 MG PO CAPS: 100.0000 mg | ORAL_CAPSULE | Freq: Four times a day (QID) | ORAL | 0 refills | Status: DC | PRN

## 2017-05-24 MED ORDER — SPACER/AERO CHAMBER MOUTHPIECE MISC: 1.0000 [IU] | 0 refills | Status: DC | PRN

## 2017-05-24 NOTE — Discharge Instructions (Signed)
It was a pleasure to take care of you today, and thank you for coming to our emergency department.  If you have any questions or concerns before leaving please ask the nurse to grab me and I'm more than happy to go through your aftercare instructions again.  If you were prescribed any opioid pain medication today such as Norco, Vicodin, Percocet, morphine, hydrocodone, or oxycodone please make sure you do not drive when you are taking this medication as it can alter your ability to drive safely.  If you have any concerns once you are home that you are not improving or are in fact getting worse before you can make it to your follow-up appointment, please do not hesitate to call 911 and come back for further evaluation.  Merrily Brittle, MD  Results for orders placed or performed during the hospital encounter of 05/23/17  Basic metabolic panel  Result Value Ref Range   Sodium 134 (L) 135 - 145 mmol/L   Potassium 2.8 (L) 3.5 - 5.1 mmol/L   Chloride 87 (L) 101 - 111 mmol/L   CO2 34 (H) 22 - 32 mmol/L   Glucose, Bld 145 (H) 65 - 99 mg/dL   BUN 36 (H) 6 - 20 mg/dL   Creatinine, Ser 6.76 (H) 0.44 - 1.00 mg/dL   Calcium 9.1 8.9 - 19.5 mg/dL   GFR calc non Af Amer 30 (L) >60 mL/min   GFR calc Af Amer 35 (L) >60 mL/min   Anion gap 13 5 - 15  CBC  Result Value Ref Range   WBC 7.9 3.6 - 11.0 K/uL   RBC 4.66 3.80 - 5.20 MIL/uL   Hemoglobin 14.7 12.0 - 16.0 g/dL   HCT 09.3 26.7 - 12.4 %   MCV 92.3 80.0 - 100.0 fL   MCH 31.4 26.0 - 34.0 pg   MCHC 34.1 32.0 - 36.0 g/dL   RDW 58.0 (H) 99.8 - 33.8 %   Platelets 305 150 - 440 K/uL  Troponin I  Result Value Ref Range   Troponin I <0.03 <0.03 ng/mL   Dg Chest Port 1 View  Result Date: 05/23/2017 CLINICAL DATA:  c/o congestion, weakness since Saturday. EXAM: PORTABLE CHEST 1 VIEW COMPARISON:  None. FINDINGS: Borderline cardiomegaly. Lungs are clear. No pleural effusion or pneumothorax seen. No acute or suspicious osseous finding. IMPRESSION: No  active disease. No evidence of pneumonia or pulmonary edema. Borderline cardiomegaly. Electronically Signed   By: Bary Richard M.D.   On: 05/23/2017 23:52

## 2017-05-29 ENCOUNTER — Encounter: Payer: Self-pay | Admitting: Internal Medicine

## 2017-05-29 ENCOUNTER — Ambulatory Visit (INDEPENDENT_AMBULATORY_CARE_PROVIDER_SITE_OTHER): Payer: Medicare Other | Admitting: Internal Medicine

## 2017-05-29 VITALS — BP 132/74 | HR 54 | Resp 16 | Ht 65.0 in | Wt 270.0 lb

## 2017-05-29 DIAGNOSIS — J440 Chronic obstructive pulmonary disease with acute lower respiratory infection: Secondary | ICD-10-CM | POA: Diagnosis not present

## 2017-05-29 DIAGNOSIS — K219 Gastro-esophageal reflux disease without esophagitis: Secondary | ICD-10-CM | POA: Diagnosis not present

## 2017-05-29 DIAGNOSIS — I509 Heart failure, unspecified: Secondary | ICD-10-CM | POA: Diagnosis not present

## 2017-05-29 DIAGNOSIS — G4733 Obstructive sleep apnea (adult) (pediatric): Secondary | ICD-10-CM

## 2017-05-29 DIAGNOSIS — J209 Acute bronchitis, unspecified: Secondary | ICD-10-CM

## 2017-05-29 DIAGNOSIS — R0602 Shortness of breath: Secondary | ICD-10-CM

## 2017-05-29 DIAGNOSIS — Z9989 Dependence on other enabling machines and devices: Secondary | ICD-10-CM | POA: Diagnosis not present

## 2017-05-29 MED ORDER — FLUTICASONE-UMECLIDIN-VILANT 100-62.5-25 MCG/INH IN AEPB: 1.0000 | INHALATION_SPRAY | Freq: Every day | RESPIRATORY_TRACT | 4 refills | Status: DC

## 2017-05-29 MED ORDER — LEVOFLOXACIN 500 MG PO TABS: 500.0000 mg | ORAL_TABLET | Freq: Every day | ORAL | 0 refills | Status: DC

## 2017-05-29 MED ORDER — PREDNISONE 10 MG (21) PO TBPK: ORAL_TABLET | ORAL | 0 refills | Status: DC

## 2017-05-29 NOTE — Progress Notes (Signed)
[facility] [place], [place] [address]  Pulmonary Sleep Medicine   Office Visit Note  Patient Name: Laura [name] DOB: 02-07-1945 MRN [id number]  Date of Service: 05/29/2017  Complaints/HPI:  She was seen by her primary care with cough and congestion.  Patient was sent to the emergency department in the emergency department she had a chest x-ray done which did not show ammonia but she did have some bronchitis.  She is still having a cough which is mostly dry.  She had taken doxycycline as well as 5 day course of single dose prednisone.  Does not appear that she is on any inhalers.  Patient states no fevers no chills.  Denies having any fevers at this time no chest pain noted.  In the past there is some question of congestive heart failure diagnosis.  She has no recent echocardiogram that I could find in her medical record.  Patient does have some increasing shortness of breath when she lays flat.  ROS  General: (-) fever, (-) chills, (-) night sweats, (-) weakness Skin: (-) rashes, (-) itching,. Eyes: (-) visual changes, (-) redness, (-) itching. Nose and Sinuses: (-) nasal stuffiness or itchiness, (-) postnasal drip, (-) nosebleeds, (-) sinus trouble. Mouth and Throat: (-) sore throat, (-) hoarseness. Neck: (-) swollen glands, (-) enlarged thyroid, (-) neck pain. Respiratory: + cough, (-) bloody sputum, + shortness of breath, + wheezing. Cardiovascular: + ankle swelling, (-) chest pain. Lymphatic: (-) lymph node enlargement. Neurologic: (-) numbness, (-) tingling. Psychiatric: (-) anxiety, (-) depression   Current Medication: Outpatient Encounter Medications as of 05/29/2017  Medication Sig  . amLODipine (NORVASC) 10 MG tablet Take 10 mg by mouth 2 (two) times daily.   Marland Kitchen aspirin EC 81 MG tablet Take by mouth.  . benzonatate (TESSALON PERLES) 100 MG capsule Take 1 capsule (100 mg total) by mouth every 6 (six) hours as needed for cough.  .  chlorthalidone (HYGROTON) 25 MG tablet TAKE 1/2 TABLET (12.5MG) BY MOUTH EVERY DAY  . Cholecalciferol (VITAMIN D) 2000 UNITS tablet Take by mouth daily.   Marland Kitchen docusate sodium (COLACE) 100 MG capsule Take 100 mg by mouth 2 (two) times daily.  . DULoxetine (CYMBALTA) 30 MG capsule Two tablets in the morning, and one in the evening.  Marland Kitchen esomeprazole (NEXIUM) 20 MG capsule Take by mouth daily.   . folic acid (FOLVITE) 1 MG tablet Take 1 mg by mouth daily.  . furosemide (LASIX) 40 MG tablet TAKE 1 TABLET ONCE DAILY  . hydroxychloroquine (PLAQUENIL) 200 MG tablet Take 200 mg by mouth daily.   . InFLIXimab-dyyb (INFLECTRA) 100 MG SOLR Inject into the vein every 6 (six) weeks.   Marland Kitchen levothyroxine (SYNTHROID, LEVOTHROID) 200 MCG tablet TAKE 1 TABLET ONCE DAILY- ON AN EMPTY STOMACH WITH A GLASS OF WATER 30-60 MINUTES BEFORE BREAKFAST  . loratadine (CLARITIN) 10 MG tablet Take by mouth daily as needed.   . lubiprostone (AMITIZA) 24 MCG capsule Take 1 capsule (24 mcg total) by mouth 2 (two) times daily with a meal. Swallow the medication whole. Do not break or chew the medication.  . methotrexate (RHEUMATREX) 2.5 MG tablet TAKE 4 TABLETS (10 MG TOTAL) BY MOUTH EVERY 7 (SEVEN) DAYS WITH A MEAL  . metoprolol succinate (TOPROL-XL) 50 MG 24 hr tablet TAKE 1 TABLET (50 MG TOTAL) BY MOUTH ONCE DAILY.  . Multiple Vitamin (MULTI-VITAMINS) TABS Take by mouth.  Derrill Memo ON 06/19/2017] oxyCODONE (OXY IR/ROXICODONE) 5 MG immediate release tablet Take 1 tablet (  5 mg total) by mouth every 6 (six) hours as needed for severe pain.  Marland Kitchen oxyCODONE (OXY IR/ROXICODONE) 5 MG immediate release tablet Take 1 tablet (5 mg total) by mouth every 6 (six) hours as needed for severe pain.  Marland Kitchen oxyCODONE (OXY IR/ROXICODONE) 5 MG immediate release tablet Take 1 tablet (5 mg total) by mouth every 6 (six) hours as needed for severe pain.  . pravastatin (PRAVACHOL) 10 MG tablet TAKE 1 TABLET BY MOUTH EVERY DAY AT NIGHT  . QUEtiapine (SEROQUEL) 200 MG  tablet Take 1 tablet (200 mg total) by mouth at bedtime.  Marland Kitchen QUEtiapine (SEROQUEL) 50 MG tablet Take 1 tablet (50 mg total) by mouth 2 (two) times daily.  Marland Kitchen Spacer/Aero Chamber Mouthpiece MISC 1 Units by Does not apply route every 4 (four) hours as needed (wheezing).  Marland Kitchen spironolactone (ALDACTONE) 25 MG tablet TAKE 1/2 (ONE-HALF) TABLET BY MOUTH DAILY   No facility-administered encounter medications on file as of 05/29/2017.     Surgical History: Past Surgical History:  Procedure Laterality Date  . ABDOMINAL HYSTERECTOMY    . CESAREAN SECTION    . HIP SURGERY Right   . REPLACEMENT TOTAL KNEE Left     Medical History: Past Medical History:  Diagnosis Date  . Allergy   . Anxiety   . Arthritis, degenerative 10/08/2013   Overview:    a.  Lumbar spine/spinal stenosis/foot drop.   b.  Hands.   . Chronic kidney disease   . Depression   . 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 Major depression, single episode, in complete remission ([place]) 06/25/2015  . Memory loss, short term 03/19/2014  . Seizure ([place]) 10/07/2014  . Sleep apnea   . Thyroid disease     Family History: Family History  Problem Relation Age of Onset  . Hypertension Mother   . Stroke Mother   . Heart attack Father   . Alcohol abuse Father   . Depression Father   . Post-traumatic stress disorder Father   . Rheum arthritis Sister   . Hypertension Sister   . Depression Sister     Social History: Social History   Socioeconomic History  . Marital status: Married    Spouse name: Not on file  . Number of children: Not on file  . Years of education: Not on file  . Highest education level: Not on file  Occupational History    Comment: retired  Scientific laboratory technician  . Financial resource strain: Not hard at all  . Food insecurity:    Worry: Never true    Inability: Never true  . Transportation needs:    Medical: No    Non-medical: No  Tobacco Use  . Smoking status: Never Smoker  . Smokeless  tobacco: Never Used  Substance and Sexual Activity  . Alcohol use: No    Alcohol/week: 0.0 oz  . Drug use: No  . Sexual activity: Not Currently  Lifestyle  . Physical activity:    Days per week: 0 days    Minutes per session: 0 min  . Stress: Not at all  Relationships  . Social connections:    Talks on phone: More than three times a week    Gets together: Once a week    Attends religious service: Never    Active member of club or organization: No    Attends meetings of clubs or organizations: Never    Relationship status: Married  . Intimate partner violence:    Fear of current  or ex partner: No    Emotionally abused: No    Physically abused: No    Forced sexual activity: No  Other Topics Concern  . Not on file  Social History Narrative  . Not on file    Vital Signs: Blood pressure 132/74, pulse (!) 54, resp. rate 16, height '5\' 5"'  (1.651 m), weight 270 lb (122.5 kg), SpO2 95 %.  Examination: General Appearance: The patient is well-developed, well-nourished, and in no distress. Skin: Gross inspection of skin unremarkable. Head: normocephalic, no gross deformities. Eyes: no gross deformities noted. ENT: ears appear grossly normal no exudates. Neck: Supple. No thyromegaly. No LAD. Respiratory: scattered rhonchi. Cardiovascular: Normal S1 and S2 without murmur or rub. Extremities: No cyanosis. pulses are equal. Neurologic: Alert and oriented. No involuntary movements.  LABS: Recent Results (from the past 2160 hour(s))  ToxASSURE Select 13 (MW), Urine     Status: None   Collection Time: 04/13/17  3:22 PM  Result Value Ref Range   Summary FINAL     Comment: ==================================================================== TOXASSURE SELECT 13 (MW) ==================================================================== Test                             Result       Flag       Units Drug Present and Declared for Prescription Verification   Oxycodone                       243          EXPECTED   ng/mg creat   Noroxycodone                   1355         EXPECTED   ng/mg creat    Sources of oxycodone include scheduled prescription medications.    Noroxycodone is an expected metabolite of oxycodone. Drug Present not Declared for Prescription Verification   Hydrocodone                    1751         UNEXPECTED ng/mg creat   Dihydrocodeine                 539          UNEXPECTED ng/mg creat   Norhydrocodone                 1161         UNEXPECTED ng/mg creat    Sources of hydrocodone include scheduled prescription    medications. Dihydrocodeine and norhydrocodone are expected    metabolites of hydrocodone. Dihydrocodeine is also avai lable as a    scheduled prescription medication. ==================================================================== Test                      Result    Flag   Units      Ref Range   Creatinine              75               mg/dL      >=20 ==================================================================== Declared Medications:  The flagging and interpretation on this report are based on the  following declared medications.  Unexpected results may arise from  inaccuracies in the declared medications.  **Note: The testing scope of this panel includes these medications:  Oxycodone (Oxy-IR)  **Note: The testing scope of this panel  does not include following  reported medications:  Amlodipine (Norvasc)  Aspirin  Chlorthalidone  Docusate (Colace)  Duloxetine (Cymbalta)  Folic acid (Folvite)  Furosemide (Lasix)  Hydroxychloroquine (Plaquenil)  Levothyroxine  Loratadine (Claritin)  Lubiprostone (Amitiza)  Methotrexate  Metoprolol (Toprol)  Multivitamin  Omeprazol e (Nexium)  Quetiapine (Seroquel)  Spironolactone (Aldactone)  Vitamin D ==================================================================== For clinical consultation, please call [phone]. ====================================================================   Basic metabolic panel     Status: Abnormal   Collection Time: 05/23/17  5:58 PM  Result Value Ref Range   Sodium 134 (L) 135 - 145 mmol/L   Potassium 2.8 (L) 3.5 - 5.1 mmol/L   Chloride 87 (L) 101 - 111 mmol/L   CO2 34 (H) 22 - 32 mmol/L   Glucose, Bld 145 (H) 65 - 99 mg/dL   BUN 36 (H) 6 - 20 mg/dL   Creatinine, Ser 1.63 (H) 0.44 - 1.00 mg/dL   Calcium 9.1 8.9 - 10.3 mg/dL   GFR calc non Af Amer 30 (L) >60 mL/min   GFR calc Af Amer 35 (L) >60 mL/min    Comment: (NOTE) The eGFR has been calculated using the CKD EPI equation. This calculation has not been validated in all clinical situations. eGFR's persistently <60 mL/min signify possible Chronic Kidney Disease.    Anion gap 13 5 - 15    Comment: Performed at [facility], [address] [place], [place] [address]  CBC     Status: Abnormal   Collection Time: 05/23/17  5:58 PM  Result Value Ref Range   WBC 7.9 3.6 - 11.0 K/uL   RBC 4.66 3.80 - 5.20 MIL/uL   Hemoglobin 14.7 12.0 - 16.0 g/dL   HCT 43.0 35.0 - 47.0 %   MCV 92.3 80.0 - 100.0 fL   MCH 31.4 26.0 - 34.0 pg   MCHC 34.1 32.0 - 36.0 g/dL   RDW 16.0 (H) 11.5 - 14.5 %   Platelets 305 150 - 440 K/uL    Comment: COUNT MAY BE INACCURATE DUE TO FIBRIN CLUMPS. Performed at [facility], [address] [place], [place] [address]   Troponin I     Status: None   Collection Time: 05/23/17  5:58 PM  Result Value Ref Range   Troponin I <0.03 <0.03 ng/mL    Comment: Performed at [facility], [address] [place], [place] [address]  Brain natriuretic peptide     Status: None   Collection Time: 05/23/17  5:58 PM  Result Value Ref Range   B Natriuretic Peptide 42.0 0.0 - 100.0 pg/mL    Comment: Performed at [facility], [address], [place], [place] [address]    Radiology: Dg Chest Port 1 View  Result Date: 05/23/2017 CLINICAL DATA:  c/o  congestion, weakness since Saturday. EXAM: PORTABLE CHEST 1 VIEW COMPARISON:  None. FINDINGS: Borderline cardiomegaly. Lungs are clear. No pleural effusion or pneumothorax seen. No acute or suspicious osseous finding. IMPRESSION: No active disease. No evidence of pneumonia or pulmonary edema. Borderline cardiomegaly. Electronically Signed   By: Franki Cabot M.D.   On: 05/23/2017 23:52    No results found.  Dg Chest Port 1 View  Result Date: 05/23/2017 CLINICAL DATA:  c/o congestion, weakness since Saturday. EXAM: PORTABLE CHEST 1 VIEW COMPARISON:  None. FINDINGS: Borderline cardiomegaly. Lungs are clear. No pleural effusion or pneumothorax seen. No acute or suspicious osseous finding. IMPRESSION: No active disease. No evidence of pneumonia or pulmonary edema. Borderline cardiomegaly. Electronically Signed  By: Franki Cabot M.D.   On: 05/23/2017 23:52      Assessment and Plan: Patient Active Problem List   Diagnosis Date Noted  . Postmenopausal 04/06/2017  . Chronic arthralgias of knees and hips (Right) 08/18/2016  . Arthritis 07/11/2016  . Dyspnea on exertion 07/11/2016  . Kidney disease 07/11/2016  . 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 Snoring 07/11/2016  . Morbid obesity with BMI of 40.0-44.9, adult ([place]) 07/11/2016  . Opioid-induced constipation ([facility]) 04/21/2016  . Osteoarthritis of knee (Right) 01/25/2016  . Diet-controlled diabetes mellitus ([place]) 10/03/2015  . GAD (generalized anxiety disorder) 09/24/2015  . Disturbance of skin sensation 07/15/2015  . Elevated sedimentation rate 07/15/2015  . Elevated C-reactive protein (CRP) 07/15/2015  . Chronic hip pain (Left) 04/06/2015  . History of TKR (total knee replacement) (Left) 02/09/2015  . History of femur fracture (Right) 02/09/2015  . 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 Lumbar foraminal stenosis (Bilateral L3-4 and L5-S1) 02/09/2015  .  Chronic low back pain (Location of Primary Source of Pain) (Bilateral) (L>R) 01/22/2015  . Chronic pain syndrome 11/25/2014  . Opioid dependence ([place]) 11/07/2014  . Lumbar spinal stenosis (5 mm Severe L3-4; 8 mm L4-5) 11/07/2014  . Lumbar spondylosis 11/07/2014  . Lumbar facet syndrome (Location of Primary Source of Pain) (Bilateral) (L>R) 11/07/2014  . Chronic knee pain (Right) 11/05/2014  . Opiate use (30 MME/Day) 11/05/2014  . Long term current use of opiate analgesic 11/05/2014  . Long term prescription opiate use 11/05/2014  . Encounter for therapeutic drug level monitoring 11/05/2014  . Morbid obesity ([place]) 10/27/2014  . Extreme obesity ([place]) 10/07/2014  . Type 2 diabetes mellitus ([place]) 03/19/2014  . Depressive disorder 10/14/2013  . Essential (primary) hypertension 10/14/2013  . Insomnia secondary to chronic pain 07/09/2013  . Anxiety 07/09/2013  . GERD (gastroesophageal reflux disease) 01/18/2013  . Adult hypothyroidism 01/18/2013  . Hypercholesterolemia 01/18/2013  . Seronegative rheumatoid arthritis ([place]) 01/18/2013  . CKD (chronic kidney disease) stage 3, GFR 30-59 ml/min (H[facility]) 01/17/2013  . Congestive heart failure with left ventricular systolic dysfunction ([place]) 11/10/2012    1. Acute Bronchitis  Without pneumonia I am going to go ahead and get her on prednisone taper high-dose as well as Levaquin at this time.  No chest x-ray indicated as this looked clean 2. OSA she will continue with her CPAP on the current pressure is doing fairly well at this time. 3. GERD  Reflux is controlled will continue present management 4. CHF schedule for echo to assess for congestive heart failure systolic and diastolic dysfunction.  General Counseling: I have discussed the findings of the evaluation and examination with Benjamine Mola.  I have also discussed any further diagnostic evaluation thatmay be needed or ordered today. Bassy verbalizes understanding of the findings of todays visit. We  also reviewed her medications today and discussed drug interactions and side effects including but not limited excessive drowsiness and altered mental states. We also discussed that there is always a risk not just to her but also people around her. she has been encouraged to call the office with any questions or concerns that should arise related to todays visit.    Time spent: 46mn  I have personally obtained a history, examined the patient, evaluated laboratory and imaging results, formulated the assessment and plan and placed orders.    SAllyne Gee MD FYoak[facility] HospitalPulmonary [facility] Sleep medicine

## 2017-05-29 NOTE — Patient Instructions (Signed)

## 2017-06-02 ENCOUNTER — Ambulatory Visit: Payer: Medicare Other

## 2017-06-02 DIAGNOSIS — R0602 Shortness of breath: Secondary | ICD-10-CM

## 2017-06-07 ENCOUNTER — Ambulatory Visit (INDEPENDENT_AMBULATORY_CARE_PROVIDER_SITE_OTHER): Payer: Medicare Other | Admitting: Internal Medicine

## 2017-06-07 DIAGNOSIS — R0602 Shortness of breath: Secondary | ICD-10-CM

## 2017-06-07 LAB — PULMONARY FUNCTION TEST

## 2017-06-08 NOTE — Procedures (Signed)
[facility] [address] [place], [address]  DATE OF SERVICE: Jun 07, 2017  Complete Pulmonary Function Testing Interpretation:  FINDINGS:  The forced vital capacity is normal the FEV1 is normal FEV1 FVC ratio is normal.  Postbronchodilator there is no significant improvement in the FEV1 however clinical improvement may occur in the absence of spirometric improvement.  Total lung capacity is normal residual volume is decreased residual volume total lung capacity ratio is decreased the FRC is normal the DLCO is within normal limits.  IMPRESSION:  This pulmonary function study is within normal limits There is no significant change postbronchodilator Clinical correlation recommended  Yevonne Pax, MD [facility] Pulmonary Critical Care Medicine Sleep Medicine

## 2017-06-09 ENCOUNTER — Other Ambulatory Visit: Payer: Self-pay

## 2017-06-19 ENCOUNTER — Ambulatory Visit (INDEPENDENT_AMBULATORY_CARE_PROVIDER_SITE_OTHER): Payer: Medicare Other | Admitting: Internal Medicine

## 2017-06-19 ENCOUNTER — Encounter: Payer: Self-pay | Admitting: Internal Medicine

## 2017-06-19 VITALS — BP 128/72 | HR 92 | Resp 16 | Ht 65.0 in | Wt 270.0 lb

## 2017-06-19 DIAGNOSIS — N183 Chronic kidney disease, stage 3 unspecified: Secondary | ICD-10-CM

## 2017-06-19 DIAGNOSIS — I509 Heart failure, unspecified: Secondary | ICD-10-CM

## 2017-06-19 DIAGNOSIS — K219 Gastro-esophageal reflux disease without esophagitis: Secondary | ICD-10-CM | POA: Diagnosis not present

## 2017-06-19 DIAGNOSIS — J4520 Mild intermittent asthma, uncomplicated: Secondary | ICD-10-CM

## 2017-06-19 NOTE — Progress Notes (Signed)
[facility] [place], [place] [address]  Pulmonary Sleep Medicine   Office Visit Note  Patient Name: Laura [name] DOB: 1945-04-12 MRN [id number]  Date of Service: 06/19/2017  Complaints/HPI:  She states she is doing fairly well overall.  Denies having any chest pain no palpitations.  Some shortness of breath with exertion.  Denies having any fevers or chills.  She has some cough noted on occasion.  Not producing any sputum.  Denies having any hemoptysis.  Occasional increased swelling of her legs is noted.  ROS  General: (-) fever, (-) chills, (-) night sweats, (-) weakness Skin: (-) rashes, (-) itching,. Eyes: (-) visual changes, (-) redness, (-) itching. Nose and Sinuses: (-) nasal stuffiness or itchiness, (-) postnasal drip, (-) nosebleeds, (-) sinus trouble. Mouth and Throat: (-) sore throat, (-) hoarseness. Neck: (-) swollen glands, (-) enlarged thyroid, (-) neck pain. Respiratory: - cough, (-) bloody sputum, - shortness of breath, - wheezing. Cardiovascular: - ankle swelling, (-) chest pain. Lymphatic: (-) lymph node enlargement. Neurologic: (-) numbness, (-) tingling. Psychiatric: (-) anxiety, (-) depression   Current Medication: Outpatient Encounter Medications as of 06/19/2017  Medication Sig  . amLODipine (NORVASC) 10 MG tablet Take 10 mg by mouth 2 (two) times daily.   Marland Kitchen aspirin EC 81 MG tablet Take by mouth.  . benzonatate (TESSALON PERLES) 100 MG capsule Take 1 capsule (100 mg total) by mouth every 6 (six) hours as needed for cough.  . chlorthalidone (HYGROTON) 25 MG tablet TAKE 1/2 TABLET (12.5MG) BY MOUTH EVERY DAY  . Cholecalciferol (VITAMIN D) 2000 UNITS tablet Take by mouth daily.   Marland Kitchen docusate sodium (COLACE) 100 MG capsule Take 100 mg by mouth 2 (two) times daily.  . DULoxetine (CYMBALTA) 30 MG capsule Two tablets in the morning, and one in the evening.  Marland Kitchen esomeprazole (NEXIUM) 20 MG capsule Take by mouth daily.   .  Fluticasone-Umeclidin-Vilant (TRELEGY ELLIPTA) 100-62.5-25 MCG/INH AEPB Inhale 1 puff into the lungs daily.  . folic acid (FOLVITE) 1 MG tablet Take 1 mg by mouth daily.  . furosemide (LASIX) 40 MG tablet TAKE 1 TABLET ONCE DAILY  . hydroxychloroquine (PLAQUENIL) 200 MG tablet Take 200 mg by mouth daily.   . InFLIXimab-dyyb (INFLECTRA) 100 MG SOLR Inject into the vein every 6 (six) weeks.   Marland Kitchen levofloxacin (LEVAQUIN) 500 MG tablet Take 1 tablet (500 mg total) by mouth daily.  Marland Kitchen levothyroxine (SYNTHROID, LEVOTHROID) 200 MCG tablet TAKE 1 TABLET ONCE DAILY- ON AN EMPTY STOMACH WITH A GLASS OF WATER 30-60 MINUTES BEFORE BREAKFAST  . loratadine (CLARITIN) 10 MG tablet Take by mouth daily as needed.   . lubiprostone (AMITIZA) 24 MCG capsule Take 1 capsule (24 mcg total) by mouth 2 (two) times daily with a meal. Swallow the medication whole. Do not break or chew the medication.  . methotrexate (RHEUMATREX) 2.5 MG tablet TAKE 4 TABLETS (10 MG TOTAL) BY MOUTH EVERY 7 (SEVEN) DAYS WITH A MEAL  . metoprolol succinate (TOPROL-XL) 50 MG 24 hr tablet TAKE 1 TABLET (50 MG TOTAL) BY MOUTH ONCE DAILY.  . Multiple Vitamin (MULTI-VITAMINS) TABS Take by mouth.  . oxyCODONE (OXY IR/ROXICODONE) 5 MG immediate release tablet Take 1 tablet (5 mg total) by mouth every 6 (six) hours as needed for severe pain.  Marland Kitchen oxyCODONE (OXY IR/ROXICODONE) 5 MG immediate release tablet Take 1 tablet (5 mg total) by mouth every 6 (six) hours as needed for severe pain.  . pravastatin (PRAVACHOL) 10 MG tablet TAKE 1  TABLET BY MOUTH EVERY DAY AT NIGHT  . predniSONE (STERAPRED UNI-PAK 21 TAB) 10 MG (21) TBPK tablet As directed  . QUEtiapine (SEROQUEL) 200 MG tablet Take 1 tablet (200 mg total) by mouth at bedtime.  Marland Kitchen QUEtiapine (SEROQUEL) 50 MG tablet Take 1 tablet (50 mg total) by mouth 2 (two) times daily.  Marland Kitchen Spacer/Aero Chamber Mouthpiece MISC 1 Units by Does not apply route every 4 (four) hours as needed (wheezing).  Marland Kitchen spironolactone  (ALDACTONE) 25 MG tablet TAKE 1/2 (ONE-HALF) TABLET BY MOUTH DAILY  . oxyCODONE (OXY IR/ROXICODONE) 5 MG immediate release tablet Take 1 tablet (5 mg total) by mouth every 6 (six) hours as needed for severe pain.   No facility-administered encounter medications on file as of 06/19/2017.     Surgical History: Past Surgical History:  Procedure Laterality Date  . ABDOMINAL HYSTERECTOMY    . CESAREAN SECTION    . HIP SURGERY Right   . REPLACEMENT TOTAL KNEE Left     Medical History: Past Medical History:  Diagnosis Date  . Allergy   . Anxiety   . Arthritis, degenerative 10/08/2013   Overview:    a.  Lumbar spine/spinal stenosis/foot drop.   b.  Hands.   . Chronic kidney disease   . Depression   . 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 Major depression, single episode, in complete remission ([place]) 06/25/2015  . Memory loss, short term 03/19/2014  . Seizure ([place]) 10/07/2014  . Sleep apnea   . Thyroid disease     Family History: Family History  Problem Relation Age of Onset  . Hypertension Mother   . Stroke Mother   . Heart attack Father   . Alcohol abuse Father   . Depression Father   . Post-traumatic stress disorder Father   . Rheum arthritis Sister   . Hypertension Sister   . Depression Sister     Social History: Social History   Socioeconomic History  . Marital status: Married    Spouse name: Not on file  . Number of children: Not on file  . Years of education: Not on file  . Highest education level: Not on file  Occupational History    Comment: retired  Scientific laboratory technician  . Financial resource strain: Not hard at all  . Food insecurity:    Worry: Never true    Inability: Never true  . Transportation needs:    Medical: No    Non-medical: No  Tobacco Use  . Smoking status: Never Smoker  . Smokeless tobacco: Never Used  Substance and Sexual Activity  . Alcohol use: No    Alcohol/week: 0.0 oz  . Drug use: No  . Sexual activity: Not  Currently  Lifestyle  . Physical activity:    Days per week: 0 days    Minutes per session: 0 min  . Stress: Not at all  Relationships  . Social connections:    Talks on phone: More than three times a week    Gets together: Once a week    Attends religious service: Never    Active member of club or organization: No    Attends meetings of clubs or organizations: Never    Relationship status: Married  . Intimate partner violence:    Fear of current or ex partner: No    Emotionally abused: No    Physically abused: No    Forced sexual activity: No  Other Topics Concern  . Not on file  Social History Narrative  .  Not on file    Vital Signs: Blood pressure 128/72, pulse 92, resp. rate 16, height '5\' 5"'  (1.651 m), weight 270 lb (122.5 kg), SpO2 94 %.  Examination: General Appearance: The patient is well-developed, well-nourished, and in no distress. Skin: Gross inspection of skin unremarkable. Head: normocephalic, no gross deformities. Eyes: no gross deformities noted. ENT: ears appear grossly normal no exudates. Neck: Supple. No thyromegaly. No LAD. Respiratory: no rhonchi noted at this time. Cardiovascular: Normal S1 and S2 without murmur or rub. Extremities: No cyanosis. pulses are equal. Neurologic: Alert and oriented. No involuntary movements.  LABS: Recent Results (from the past 2160 hour(s))  ToxASSURE Select 13 (MW), Urine     Status: None   Collection Time: 04/13/17  3:22 PM  Result Value Ref Range   Summary FINAL     Comment: ==================================================================== TOXASSURE SELECT 13 (MW) ==================================================================== Test                             Result       Flag       Units Drug Present and Declared for Prescription Verification   Oxycodone                      243          EXPECTED   ng/mg creat   Noroxycodone                   1355         EXPECTED   ng/mg creat    Sources of oxycodone  include scheduled prescription medications.    Noroxycodone is an expected metabolite of oxycodone. Drug Present not Declared for Prescription Verification   Hydrocodone                    1751         UNEXPECTED ng/mg creat   Dihydrocodeine                 539          UNEXPECTED ng/mg creat   Norhydrocodone                 1161         UNEXPECTED ng/mg creat    Sources of hydrocodone include scheduled prescription    medications. Dihydrocodeine and norhydrocodone are expected    metabolites of hydrocodone. Dihydrocodeine is also avai lable as a    scheduled prescription medication. ==================================================================== Test                      Result    Flag   Units      Ref Range   Creatinine              75               mg/dL      >=20 ==================================================================== Declared Medications:  The flagging and interpretation on this report are based on the  following declared medications.  Unexpected results may arise from  inaccuracies in the declared medications.  **Note: The testing scope of this panel includes these medications:  Oxycodone (Oxy-IR)  **Note: The testing scope of this panel does not include following  reported medications:  Amlodipine (Norvasc)  Aspirin  Chlorthalidone  Docusate (Colace)  Duloxetine (Cymbalta)  Folic acid (Folvite)  Furosemide (Lasix)  Hydroxychloroquine (Plaquenil)  Levothyroxine  Loratadine (Claritin)  Lubiprostone (Amitiza)  Methotrexate  Metoprolol (Toprol)  Multivitamin  Omeprazol e (Nexium)  Quetiapine (Seroquel)  Spironolactone (Aldactone)  Vitamin D ==================================================================== For clinical consultation, please call [phone]. ====================================================================   Basic metabolic panel     Status: Abnormal   Collection Time: 05/23/17  5:58 PM  Result Value Ref Range   Sodium 134  (L) 135 - 145 mmol/L   Potassium 2.8 (L) 3.5 - 5.1 mmol/L   Chloride 87 (L) 101 - 111 mmol/L   CO2 34 (H) 22 - 32 mmol/L   Glucose, Bld 145 (H) 65 - 99 mg/dL   BUN 36 (H) 6 - 20 mg/dL   Creatinine, Ser 1.63 (H) 0.44 - 1.00 mg/dL   Calcium 9.1 8.9 - 10.3 mg/dL   GFR calc non Af Amer 30 (L) >60 mL/min   GFR calc Af Amer 35 (L) >60 mL/min    Comment: (NOTE) The eGFR has been calculated using the CKD EPI equation. This calculation has not been validated in all clinical situations. eGFR's persistently <60 mL/min signify possible Chronic Kidney Disease.    Anion gap 13 5 - 15    Comment: Performed at [facility], [address] [place], [place] [address]  CBC     Status: Abnormal   Collection Time: 05/23/17  5:58 PM  Result Value Ref Range   WBC 7.9 3.6 - 11.0 K/uL   RBC 4.66 3.80 - 5.20 MIL/uL   Hemoglobin 14.7 12.0 - 16.0 g/dL   HCT 43.0 35.0 - 47.0 %   MCV 92.3 80.0 - 100.0 fL   MCH 31.4 26.0 - 34.0 pg   MCHC 34.1 32.0 - 36.0 g/dL   RDW 16.0 (H) 11.5 - 14.5 %   Platelets 305 150 - 440 K/uL    Comment: COUNT MAY BE INACCURATE DUE TO FIBRIN CLUMPS. Performed at [facility], [address] [place], [place] [address]   Troponin I     Status: None   Collection Time: 05/23/17  5:58 PM  Result Value Ref Range   Troponin I <0.03 <0.03 ng/mL    Comment: Performed at [facility], [address] [place], [place] [address]  Brain natriuretic peptide     Status: None   Collection Time: 05/23/17  5:58 PM  Result Value Ref Range   B Natriuretic Peptide 42.0 0.0 - 100.0 pg/mL    Comment: Performed at [facility], [place], [address], [place], [place] [address]    Radiology: Dg Chest Port 1 View  Result Date: 05/23/2017 CLINICAL DATA:  c/o congestion, weakness since Saturday. EXAM: PORTABLE CHEST 1 VIEW COMPARISON:  None. FINDINGS: Borderline cardiomegaly. Lungs are clear. No pleural effusion or pneumothorax seen. No acute or suspicious osseous  finding. IMPRESSION: No active disease. No evidence of pneumonia or pulmonary edema. Borderline cardiomegaly. Electronically Signed   By: Franki Cabot M.D.   On: 05/23/2017 23:52    No results found.  Dg Chest Port 1 View  Result Date: 05/23/2017 CLINICAL DATA:  c/o congestion, weakness since Saturday. EXAM: PORTABLE CHEST 1 VIEW COMPARISON:  None. FINDINGS: Borderline cardiomegaly. Lungs are clear. No pleural effusion or pneumothorax seen. No acute or suspicious osseous finding. IMPRESSION: No active disease. No evidence of pneumonia or pulmonary edema. Borderline cardiomegaly. Electronically Signed   By: Franki Cabot M.D.   On: 05/23/2017 23:52      Assessment and Plan: Patient Active Problem List   Diagnosis Date Noted  . Postmenopausal 04/06/2017  . Chronic arthralgias of  knees and hips (Right) 08/18/2016  . Arthritis 07/11/2016  . Dyspnea on exertion 07/11/2016  . Kidney disease 07/11/2016  . 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 Snoring 07/11/2016  . Morbid obesity with BMI of 40.0-44.9, adult ([place]) 07/11/2016  . Opioid-induced constipation ([facility]) 04/21/2016  . Osteoarthritis of knee (Right) 01/25/2016  . Diet-controlled diabetes mellitus ([place]) 10/03/2015  . GAD (generalized anxiety disorder) 09/24/2015  . Disturbance of skin sensation 07/15/2015  . Elevated sedimentation rate 07/15/2015  . Elevated C-reactive protein (CRP) 07/15/2015  . Chronic hip pain (Left) 04/06/2015  . History of TKR (total knee replacement) (Left) 02/09/2015  . History of femur fracture (Right) 02/09/2015  . 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 Lumbar foraminal stenosis (Bilateral L3-4 and L5-S1) 02/09/2015  . Chronic low back pain (Location of Primary Source of Pain) (Bilateral) (L>R) 01/22/2015  . Chronic pain syndrome 11/25/2014  . Opioid dependence ([place]) 11/07/2014  . Lumbar spinal stenosis (5 mm Severe L3-4; 8 mm  L4-5) 11/07/2014  . Lumbar spondylosis 11/07/2014  . Lumbar facet syndrome (Location of Primary Source of Pain) (Bilateral) (L>R) 11/07/2014  . Chronic knee pain (Right) 11/05/2014  . Opiate use (30 MME/Day) 11/05/2014  . Long term current use of opiate analgesic 11/05/2014  . Long term prescription opiate use 11/05/2014  . Encounter for therapeutic drug level monitoring 11/05/2014  . Morbid obesity ([employer]) 10/27/2014  . Extreme obesity ([place]) 10/07/2014  . Type 2 diabetes mellitus ([place]) 03/19/2014  . Depressive disorder 10/14/2013  . Essential (primary) hypertension 10/14/2013  . Insomnia secondary to chronic pain 07/09/2013  . Anxiety 07/09/2013  . GERD (gastroesophageal reflux disease) 01/18/2013  . Adult hypothyroidism 01/18/2013  . Hypercholesterolemia 01/18/2013  . Seronegative rheumatoid arthritis ([place]) 01/18/2013  . CKD (chronic kidney disease) stage 3, GFR 30-59 ml/min ([facility]) 01/17/2013  . Congestive heart failure with left ventricular systolic dysfunction ([place]) 11/10/2012    1. Asthma  Has been having some increasing shortness of breath noted she has been taking her medications as prescribed.  The pulmonary functions were reviewed in detail today. 2. CKD III  She will continue with present supportive care.  Follows up with Nephrology currently is well compensated 3. CHF stable at this time compensated will continue to monitor closely 4. GERD controlled on current therapy will continue with supportive care  General Counseling: I have discussed the findings of the evaluation and examination with Benjamine Mola.  I have also discussed any further diagnostic evaluation thatmay be needed or ordered today. Estefany verbalizes understanding of the findings of todays visit. We also reviewed her medications today and discussed drug interactions and side effects including but not limited excessive drowsiness and altered mental states. We also discussed that there is always a risk not just to her  but also people around her. she has been encouraged to call the office with any questions or concerns that should arise related to todays visit.    Time spent: 60mn  I have personally obtained a history, examined the patient, evaluated laboratory and imaging results, formulated the assessment and plan and placed orders.    SAllyne Gee MD FAdvanced Surgery Center Of [facility] County LLCPulmonary and Critical Care Sleep medicine

## 2017-06-19 NOTE — Patient Instructions (Signed)

## 2017-07-10 ENCOUNTER — Encounter: Payer: Self-pay | Admitting: Nurse Practitioner

## 2017-07-10 ENCOUNTER — Ambulatory Visit: Payer: Medicare Other | Attending: Nurse Practitioner | Admitting: Nurse Practitioner

## 2017-07-10 VITALS — BP 149/89 | HR 82 | Temp 98.4°F | Resp 16 | Ht 65.0 in | Wt 270.0 lb

## 2017-07-10 DIAGNOSIS — M1990 Unspecified osteoarthritis, unspecified site: Secondary | ICD-10-CM

## 2017-07-10 DIAGNOSIS — M47816 Spondylosis without myelopathy or radiculopathy, lumbar region: Secondary | ICD-10-CM | POA: Diagnosis not present

## 2017-07-10 DIAGNOSIS — F418 Other specified anxiety disorders: Secondary | ICD-10-CM | POA: Diagnosis not present

## 2017-07-10 DIAGNOSIS — Z5181 Encounter for therapeutic drug level monitoring: Secondary | ICD-10-CM | POA: Diagnosis present

## 2017-07-10 DIAGNOSIS — R0683 Snoring: Secondary | ICD-10-CM | POA: Insufficient documentation

## 2017-07-10 DIAGNOSIS — Z7982 Long term (current) use of aspirin: Secondary | ICD-10-CM | POA: Insufficient documentation

## 2017-07-10 DIAGNOSIS — E7800 Pure hypercholesterolemia, unspecified: Secondary | ICD-10-CM | POA: Insufficient documentation

## 2017-07-10 DIAGNOSIS — N959 Unspecified menopausal and perimenopausal disorder: Secondary | ICD-10-CM | POA: Insufficient documentation

## 2017-07-10 DIAGNOSIS — M48061 Spinal stenosis, lumbar region without neurogenic claudication: Secondary | ICD-10-CM | POA: Insufficient documentation

## 2017-07-10 DIAGNOSIS — E039 Hypothyroidism, unspecified: Secondary | ICD-10-CM | POA: Diagnosis not present

## 2017-07-10 DIAGNOSIS — Z79899 Other long term (current) drug therapy: Secondary | ICD-10-CM | POA: Insufficient documentation

## 2017-07-10 DIAGNOSIS — I5020 Unspecified systolic (congestive) heart failure: Secondary | ICD-10-CM | POA: Insufficient documentation

## 2017-07-10 DIAGNOSIS — R209 Unspecified disturbances of skin sensation: Secondary | ICD-10-CM | POA: Insufficient documentation

## 2017-07-10 DIAGNOSIS — M0600 Rheumatoid arthritis without rheumatoid factor, unspecified site: Secondary | ICD-10-CM | POA: Insufficient documentation

## 2017-07-10 DIAGNOSIS — R700 Elevated erythrocyte sedimentation rate: Secondary | ICD-10-CM | POA: Diagnosis not present

## 2017-07-10 DIAGNOSIS — M479 Spondylosis, unspecified: Secondary | ICD-10-CM | POA: Diagnosis not present

## 2017-07-10 DIAGNOSIS — R7982 Elevated C-reactive protein (CRP): Secondary | ICD-10-CM | POA: Diagnosis not present

## 2017-07-10 DIAGNOSIS — K5903 Drug induced constipation: Secondary | ICD-10-CM | POA: Diagnosis not present

## 2017-07-10 DIAGNOSIS — I130 Hypertensive heart and chronic kidney disease with heart failure and stage 1 through stage 4 chronic kidney disease, or unspecified chronic kidney disease: Secondary | ICD-10-CM | POA: Diagnosis not present

## 2017-07-10 DIAGNOSIS — M1711 Unilateral primary osteoarthritis, right knee: Secondary | ICD-10-CM

## 2017-07-10 DIAGNOSIS — F411 Generalized anxiety disorder: Secondary | ICD-10-CM | POA: Insufficient documentation

## 2017-07-10 DIAGNOSIS — G4700 Insomnia, unspecified: Secondary | ICD-10-CM | POA: Diagnosis not present

## 2017-07-10 DIAGNOSIS — M069 Rheumatoid arthritis, unspecified: Secondary | ICD-10-CM | POA: Diagnosis not present

## 2017-07-10 DIAGNOSIS — Z811 Family history of alcohol abuse and dependence: Secondary | ICD-10-CM | POA: Insufficient documentation

## 2017-07-10 DIAGNOSIS — Z8249 Family history of ischemic heart disease and other diseases of the circulatory system: Secondary | ICD-10-CM | POA: Insufficient documentation

## 2017-07-10 DIAGNOSIS — E1122 Type 2 diabetes mellitus with diabetic chronic kidney disease: Secondary | ICD-10-CM | POA: Diagnosis not present

## 2017-07-10 DIAGNOSIS — G894 Chronic pain syndrome: Secondary | ICD-10-CM

## 2017-07-10 DIAGNOSIS — N183 Chronic kidney disease, stage 3 (moderate): Secondary | ICD-10-CM | POA: Insufficient documentation

## 2017-07-10 DIAGNOSIS — Z96652 Presence of left artificial knee joint: Secondary | ICD-10-CM | POA: Insufficient documentation

## 2017-07-10 DIAGNOSIS — Z6841 Body Mass Index (BMI) 40.0 and over, adult: Secondary | ICD-10-CM | POA: Insufficient documentation

## 2017-07-10 DIAGNOSIS — M549 Dorsalgia, unspecified: Secondary | ICD-10-CM | POA: Insufficient documentation

## 2017-07-10 DIAGNOSIS — Z818 Family history of other mental and behavioral disorders: Secondary | ICD-10-CM | POA: Insufficient documentation

## 2017-07-10 DIAGNOSIS — K219 Gastro-esophageal reflux disease without esophagitis: Secondary | ICD-10-CM | POA: Insufficient documentation

## 2017-07-10 DIAGNOSIS — Z888 Allergy status to other drugs, medicaments and biological substances status: Secondary | ICD-10-CM | POA: Insufficient documentation

## 2017-07-10 DIAGNOSIS — R0609 Other forms of dyspnea: Secondary | ICD-10-CM | POA: Insufficient documentation

## 2017-07-10 DIAGNOSIS — Z880 Allergy status to penicillin: Secondary | ICD-10-CM | POA: Insufficient documentation

## 2017-07-10 DIAGNOSIS — M2550 Pain in unspecified joint: Secondary | ICD-10-CM | POA: Diagnosis not present

## 2017-07-10 DIAGNOSIS — M797 Fibromyalgia: Secondary | ICD-10-CM | POA: Insufficient documentation

## 2017-07-10 DIAGNOSIS — M25561 Pain in right knee: Secondary | ICD-10-CM | POA: Insufficient documentation

## 2017-07-10 DIAGNOSIS — Z79890 Hormone replacement therapy (postmenopausal): Secondary | ICD-10-CM | POA: Insufficient documentation

## 2017-07-10 DIAGNOSIS — Z79891 Long term (current) use of opiate analgesic: Secondary | ICD-10-CM

## 2017-07-10 DIAGNOSIS — Z823 Family history of stroke: Secondary | ICD-10-CM | POA: Insufficient documentation

## 2017-07-10 MED ORDER — OXYCODONE HCL 5 MG PO TABS: 5.0000 mg | ORAL_TABLET | Freq: Four times a day (QID) | ORAL | 0 refills | Status: DC | PRN

## 2017-07-10 NOTE — Progress Notes (Signed)
Nursing Pain Medication Assessment:  Safety precautions to be maintained throughout the outpatient stay will include: orient to surroundings, keep bed in low position, maintain call bell within reach at all times, provide assistance with transfer out of bed and ambulation.  Medication Inspection Compliance: Pill count conducted under aseptic conditions, in front of the patient. Neither the pills nor the bottle was removed from the patient's sight at any time. Once count was completed pills were immediately returned to the patient in their original bottle.  Medication: Oxycodone IR Pill/Patch Count: 21 of 120 pills remain Pill/Patch Appearance: Markings consistent with prescribed medication Bottle Appearance: Standard pharmacy container. Clearly labeled. Filled Date:  06/ 11 / 2019 Last Medication intake:  Today

## 2017-07-10 NOTE — Progress Notes (Signed)
[name]  MRN: [id number]  Referring Provider: Glendon Axe, MD  DOB: 16-Jan-1945  PCP: Glendon Axe, MD  DOS: 07/10/2017  Note by: Vevelyn Francois NP  Service setting: Ambulatory outpatient  Specialty: Interventional Pain Management  Location: [facility] (AMB) Pain Management Facility    Patient type: Established    Primary Reason(s) for Visit: Encounter for prescription drug management. (Level of risk: moderate)  CC: Joint Pain (RA) and Back Pain (lower mid (spine area))  HPI  Laura [name] is a 72 y.o. year old, female patient, who comes today for a medication management evaluation. She has CKD (chronic kidney disease) stage 3, GFR 30-59 ml/min ([place]); Depressive disorder; Essential (primary) hypertension; GERD (gastroesophageal reflux disease); Adult hypothyroidism; Hypercholesterolemia; Extreme obesity ([place]); Insomnia secondary to chronic pain; Congestive heart failure with left ventricular systolic dysfunction ([place]); Type 2 diabetes mellitus ([place]); Morbid obesity ([place]); Seronegative rheumatoid arthritis ([place]); Chronic knee pain (Right); Opiate use (30 MME/Day); Long term current use of opiate analgesic; Long term prescription opiate use; Encounter for therapeutic drug level monitoring; Opioid dependence ([place]); Lumbar spinal stenosis (5 mm Severe L3-4; 8 mm L4-5); Lumbar spondylosis; Lumbar facet syndrome (Location of Primary Source of Pain) (Bilateral) (L>R); Chronic pain syndrome; Chronic low back pain (Location of Primary Source of Pain) (Bilateral) (L>R); History of TKR (total knee replacement) (Left); History of femur fracture (Right); 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); 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 Her primarily concern today is the Joint Pain (RA) and Back Pain (lower mid (spine area))  Pain Assessment: Location: Lower, Mid Back(joints from RA) Radiating: down the back of leg Onset: More than a month ago Duration: Chronic pain Quality: Discomfort, Stabbing Severity: 2 /10 (subjective, self-reported pain score)  Note: Reported level is compatible with observation.                          Effect on ADL: has to limit activities.  Timing: Intermittent Modifying factors: hot compresses, hot tub, medications BP: (!) 149/89  HR: 82  Laura [name] was last scheduled for an appointment on 04/13/2017 for medication management. During today's appointment we reviewed Laura [name]'s chronic pain status, as well as her outpatient medication regimen. She denies any new concerns today. She admits that several weeks ago with the weather change and she was having increased pain.She admits that she does occasionally take Advil.  The patient  reports that she does not use drugs. Her body mass index is 44.93 kg/m.  Further details on both, my assessment(s), as well as the proposed treatment plan, please see below.  Controlled Substance Pharmacotherapy Assessment REMS (Risk Evaluation and Mitigation Strategy)  Analgesic:Oxycodone IR 5 mg every 6 hours (20 mg/day) MME/day:30 mg/day   Laura Billow, RN  07/10/2017  1:56 PM  Sign at close encounter Nursing Pain Medication Assessment:  Safety precautions to be maintained throughout the outpatient stay will include: orient to surroundings, keep bed in low position, maintain call bell within reach at all times, provide assistance with transfer out of bed and  ambulation.  Medication Inspection  Compliance: Pill count conducted under aseptic conditions, in front of the patient. Neither the pills nor the bottle was removed from the patient's sight at any time. Once count was completed pills were immediately returned to the patient in their original bottle.  Medication: Oxycodone IR Pill/Patch Count: 21 of 120 pills remain Pill/Patch Appearance: Markings consistent with prescribed medication Bottle Appearance: Standard pharmacy container. Clearly labeled. Filled Date:  06/ 11 / 2019 Last Medication intake:  Today   Pharmacokinetics: Liberation and absorption (onset of action): WNL Distribution (time to peak effect): WNL Metabolism and excretion (duration of action): WNL         Pharmacodynamics: Desired effects: Analgesia: Laura [name] reports >50% benefit. Functional ability: Patient reports that medication allows her to accomplish basic ADLs Clinically meaningful improvement in function (CMIF): Sustained CMIF goals met Perceived effectiveness: Described as relatively effective, allowing for increase in activities of daily living (ADL) Undesirable effects: Side-effects or Adverse reactions: None reported Monitoring: [place] PMP: Online review of the past 76-monthperiod conducted. Compliant with practice rules and regulations Last UDS on record: Summary  Date Value Ref Range Status  04/13/2017 FINAL  Final    Comment:    ==================================================================== TOXASSURE SELECT 13 (MW) ==================================================================== Test                             Result       Flag       Units Drug Present and Declared for Prescription Verification   Oxycodone                      243          EXPECTED   ng/mg creat   Noroxycodone                   1355         EXPECTED   ng/mg creat    Sources of oxycodone include scheduled prescription medications.    Noroxycodone is an expected metabolite of  oxycodone. Drug Present not Declared for Prescription Verification   Hydrocodone                    1751         UNEXPECTED ng/mg creat   Dihydrocodeine                 539          UNEXPECTED ng/mg creat   Norhydrocodone                 1161         UNEXPECTED ng/mg creat    Sources of hydrocodone include scheduled prescription    medications. Dihydrocodeine and norhydrocodone are expected    metabolites of hydrocodone. Dihydrocodeine is also available as a    scheduled prescription medication. ==================================================================== Test                      Result    Flag   Units      Ref Range   Creatinine              75               mg/dL      >=20 ==================================================================== Declared Medications:  The flagging and interpretation on this report are based on the  following declared medications.  Unexpected results may arise from  inaccuracies in  the declared medications.  **Note: The testing scope of this panel includes these medications:  Oxycodone (Oxy-IR)  **Note: The testing scope of this panel does not include following  reported medications:  Amlodipine (Norvasc)  Aspirin  Chlorthalidone  Docusate (Colace)  Duloxetine (Cymbalta)  Folic acid (Folvite)  Furosemide (Lasix)  Hydroxychloroquine (Plaquenil)  Levothyroxine  Loratadine (Claritin)  Lubiprostone (Amitiza)  Methotrexate  Metoprolol (Toprol)  Multivitamin  Omeprazole (Nexium)  Quetiapine (Seroquel)  Spironolactone (Aldactone)  Vitamin D ==================================================================== For clinical consultation, please call [phone]. ====================================================================    UDS interpretation: Non-Compliant         She denies the use of hydrocodone. She does admit that her sister takes hydrocodone. Medication Assessment Form: Reviewed. Patient indicates being compliant with  therapy Treatment compliance: Non-compliant Risk Assessment Profile: Aberrant behavior: See prior evaluations. None observed or detected today Comorbid factors increasing risk of overdose: See prior notes. No additional risks detected today Risk of substance use disorder (SUD): Low-to-Moderate Opioid Risk Tool - 07/10/17 1404      Family History of Substance Abuse   Alcohol  Positive Female    Illegal Drugs  Negative    Rx Drugs  Negative      Personal History of Substance Abuse   Alcohol  Negative    Illegal Drugs  Negative    Rx Drugs  Negative      Psychological Disease   Psychological Disease  Negative    Depression  Negative      Total Score   Opioid Risk Tool Scoring  1    Opioid Risk Interpretation  Low Risk      ORT Scoring interpretation table:  Score <3 = Low Risk for SUD  Score between 4-7 = Moderate Risk for SUD  Score >8 = High Risk for Opioid Abuse   Risk Mitigation Strategies:  Patient Counseling: Covered Patient-Prescriber Agreement (PPA): Present and active  Notification to other healthcare providers: Done  Pharmacologic Plan: No change in therapy, at this time.             Laboratory Chemistry  Inflammation Markers (CRP: Acute Phase) (ESR: Chronic Phase) Lab Results  Component Value Date   CRP 1.9 (H) 07/15/2015   ESRSEDRATE 32 (H) 07/15/2015                         Rheumatology Markers No results found for: RF, ANA, LABURIC, URICUR, LYMEIGGIGMAB, LYMEABIGMQN, HLAB27                      Renal Function Markers Lab Results  Component Value Date   BUN 36 (H) 05/23/2017   CREATININE 1.63 (H) 05/23/2017   GFRAA 35 (L) 05/23/2017   GFRNONAA 30 (L) 05/23/2017                             Hepatic Function Markers Lab Results  Component Value Date   AST 19 07/15/2015   ALT 15 07/15/2015   ALBUMIN 4.0 07/15/2015   ALKPHOS 73 07/15/2015                        Electrolytes Lab Results  Component Value Date   NA 134 (L) 05/23/2017   K 2.8  (L) 05/23/2017   CL 87 (L) 05/23/2017   CALCIUM 9.1 05/23/2017   MG 2.1 07/15/2015  Neuropathy Markers Lab Results  Component Value Date   VITAMINB12 610 07/15/2015                        Bone Pathology Markers Lab Results  Component Value Date   25OHVITD1 36 07/15/2015   25OHVITD2 3.6 07/15/2015   25OHVITD3 32 07/15/2015                         Coagulation Parameters Lab Results  Component Value Date   PLT 305 05/23/2017                        Cardiovascular Markers Lab Results  Component Value Date   BNP 42.0 05/23/2017   TROPONINI <0.03 05/23/2017   HGB 14.7 05/23/2017   HCT 43.0 05/23/2017                         CA Markers No results found for: CEA, CA125, LABCA2                      Note: Lab results reviewed.  Recent Diagnostic Imaging Results  DG Chest Port 1 View CLINICAL DATA:  c/o congestion, weakness since Saturday.  EXAM: PORTABLE CHEST 1 VIEW  COMPARISON:  None.  FINDINGS: Borderline cardiomegaly. Lungs are clear. No pleural effusion or pneumothorax seen. No acute or suspicious osseous finding.  IMPRESSION: No active disease. No evidence of pneumonia or pulmonary edema. Borderline cardiomegaly.  Electronically Signed   By: Franki Cabot M.D.   On: 05/23/2017 23:52  Complexity Note: Imaging results reviewed. Results shared with Ms. Gambale, using Layman's terms.                         Meds   Current Outpatient Medications:  .  amLODipine (NORVASC) 10 MG tablet, Take 10 mg by mouth 2 (two) times daily. , Disp: , Rfl: 11 .  aspirin EC 81 MG tablet, Take by mouth., Disp: , Rfl:  .  benzonatate (TESSALON PERLES) 100 MG capsule, Take 1 capsule (100 mg total) by mouth every 6 (six) hours as needed for cough., Disp: 30 capsule, Rfl: 0 .  chlorthalidone (HYGROTON) 25 MG tablet, TAKE 1/2 TABLET (12.5MG) BY MOUTH EVERY DAY, Disp: , Rfl: 11 .  Cholecalciferol (VITAMIN D) 2000 UNITS tablet, Take by mouth daily. , Disp: ,  Rfl:  .  docusate sodium (COLACE) 100 MG capsule, Take 100 mg by mouth 2 (two) times daily., Disp: , Rfl:  .  DULoxetine (CYMBALTA) 30 MG capsule, Two tablets in the morning, and one in the evening., Disp: 270 capsule, Rfl: 0 .  esomeprazole (NEXIUM) 20 MG capsule, Take by mouth daily. , Disp: , Rfl:  .  Fluticasone-Umeclidin-Vilant (TRELEGY ELLIPTA) 100-62.5-25 MCG/INH AEPB, Inhale 1 puff into the lungs daily., Disp: 1 each, Rfl: 4 .  folic acid (FOLVITE) 1 MG tablet, Take 1 mg by mouth daily., Disp: , Rfl:  .  furosemide (LASIX) 40 MG tablet, TAKE 1 TABLET ONCE DAILY, Disp: , Rfl:  .  hydroxychloroquine (PLAQUENIL) 200 MG tablet, Take 200 mg by mouth daily. , Disp: , Rfl:  .  InFLIXimab-dyyb (INFLECTRA) 100 MG SOLR, Inject into the vein every 6 (six) weeks. , Disp: , Rfl:  .  levothyroxine (SYNTHROID, LEVOTHROID) 200 MCG tablet, TAKE 1 TABLET ONCE DAILY- ON AN EMPTY STOMACH WITH A GLASS  OF WATER 30-60 MINUTES BEFORE BREAKFAST, Disp: , Rfl: 5 .  loratadine (CLARITIN) 10 MG tablet, Take by mouth daily as needed. , Disp: , Rfl:  .  methotrexate (RHEUMATREX) 2.5 MG tablet, TAKE 4 TABLETS (10 MG TOTAL) BY MOUTH EVERY 7 (SEVEN) DAYS WITH A MEAL, Disp: , Rfl: 5 .  metoprolol succinate (TOPROL-XL) 50 MG 24 hr tablet, TAKE 1 TABLET (50 MG TOTAL) BY MOUTH ONCE DAILY., Disp: , Rfl: 1 .  Multiple Vitamin (MULTI-VITAMINS) TABS, Take by mouth., Disp: , Rfl:  .  [START ON 08/18/2017] oxyCODONE (OXY IR/ROXICODONE) 5 MG immediate release tablet, Take 1 tablet (5 mg total) by mouth every 6 (six) hours as needed for severe pain., Disp: 120 tablet, Rfl: 0 .  pravastatin (PRAVACHOL) 10 MG tablet, TAKE 1 TABLET BY MOUTH EVERY DAY AT NIGHT, Disp: , Rfl: 5 .  QUEtiapine (SEROQUEL) 200 MG tablet, Take 1 tablet (200 mg total) by mouth at bedtime., Disp: 90 tablet, Rfl: 0 .  QUEtiapine (SEROQUEL) 50 MG tablet, Take 1 tablet (50 mg total) by mouth 2 (two) times daily., Disp: 180 tablet, Rfl: 0 .  Spacer/Aero Chamber  Mouthpiece MISC, 1 Units by Does not apply route every 4 (four) hours as needed (wheezing)., Disp: 1 each, Rfl: 0 .  spironolactone (ALDACTONE) 25 MG tablet, TAKE 1/2 (ONE-HALF) TABLET BY MOUTH DAILY, Disp: , Rfl: 11 .  [START ON 09/17/2017] oxyCODONE (OXY IR/ROXICODONE) 5 MG immediate release tablet, Take 1 tablet (5 mg total) by mouth every 6 (six) hours as needed for severe pain., Disp: 120 tablet, Rfl: 0 .  [START ON 07/19/2017] oxyCODONE (OXY IR/ROXICODONE) 5 MG immediate release tablet, Take 1 tablet (5 mg total) by mouth every 6 (six) hours as needed for severe pain., Disp: 120 tablet, Rfl: 0  ROS  Constitutional: Denies any fever or chills Gastrointestinal: No reported hemesis, hematochezia, vomiting, or acute GI distress Musculoskeletal: Denies any acute onset joint swelling, redness, loss of ROM, or weakness Neurological: No reported episodes of acute onset apraxia, aphasia, dysarthria, agnosia, amnesia, paralysis, loss of coordination, or loss of consciousness  Allergies  Ms. Cadmus is allergic to bupropion and penicillins.  PFSH  Drug: Ms. Colella  reports that she does not use drugs. Alcohol:  reports that she does not drink alcohol. Tobacco:  reports that she has never smoked. She has never used smokeless tobacco. Medical:  has a past medical history of Allergy, Anxiety, Arthritis, degenerative (10/08/2013), Chronic kidney disease, Depression, Lumbar spinal stenosis with neurogenic claudication (11/07/2014), Major depression, single episode, in complete remission ([place]) (06/25/2015), Memory loss, short term (03/19/2014), Seizure ([place]) (10/07/2014), Sleep apnea, and Thyroid disease. Surgical: Ms. Knarr  has a past surgical history that includes Abdominal hysterectomy; Cesarean section; Replacement total knee (Left); and Hip surgery (Right). Family: family history includes Alcohol abuse in her father; Depression in her father and sister; Heart attack in her father; Hypertension in her  mother and sister; Post-traumatic stress disorder in her father; Rheum arthritis in her sister; Stroke in her mother.  Constitutional Exam  General appearance: Well nourished, well developed, and well hydrated. In no apparent acute distress Vitals:   07/10/17 1354  BP: (!) 149/89  Pulse: 82  Resp: 16  Temp: 98.4 F (36.9 C)  TempSrc: Oral  SpO2: 95%  Weight: 270 lb (122.5 kg)  Height: '5\' 5"'  (1.651 m)  Psych/Mental status: Alert, oriented x 3 (person, place, & time)       Eyes: PERLA Respiratory: No evidence of acute respiratory distress  Upper Extremity (UE) Exam    Side: Right upper extremity  Side: Left upper extremity  Skin & Extremity Inspection: Heberden's nodes (DIP)  Skin & Extremity Inspection: Heberden's nodes (DIP)  Functional ROM: Unrestricted ROM          Functional ROM: Unrestricted ROM          Muscle Tone/Strength: Functionally intact. No obvious neuro-muscular anomalies detected.  Muscle Tone/Strength: Functionally intact. No obvious neuro-muscular anomalies detected.  Sensory (Neurological): Unimpaired affecting the hand  Sensory (Neurological): Unimpaired affecting the hand  Palpation: No palpable anomalies              Palpation: No palpable anomalies              Provocative Test(s):  Phalen's test: deferred Tinel's test: deferred Apley's scratch test (touch opposite shoulder):  Action 1 (Across chest): deferred Action 2 (Overhead): deferred Action 3 (LB reach): deferred   Provocative Test(s):  Phalen's test: deferred Tinel's test: deferred Apley's scratch test (touch opposite shoulder):  Action 1 (Across chest): deferred Action 2 (Overhead): deferred Action 3 (LB reach): deferred    Thoracic Spine Area Exam  Skin & Axial Inspection: No masses, redness, or swelling Alignment: Symmetrical Functional ROM: Unrestricted ROM Stability: No instability detected Muscle Tone/Strength: Functionally intact. No obvious neuro-muscular anomalies detected. Sensory  (Neurological): Unimpaired Muscle strength & Tone: No palpable anomalies  Lumbar Spine Area Exam  Skin & Axial Inspection: No masses, redness, or swelling Alignment: Symmetrical Functional ROM: Unrestricted ROM       Stability: No instability detected Muscle Tone/Strength: Functionally intact. No obvious neuro-muscular anomalies detected. Sensory (Neurological): Unimpaired Palpation: No palpable anomalies       Provocative Tests: Lumbar Hyperextension/rotation test: deferred today       Lumbar quadrant test (Kemp's test): deferred today       Lumbar Lateral bending test: deferred today       Patrick's Maneuver: deferred today                   FABER test: deferred today       Thigh-thrust test: deferred today       S-I compression test: deferred today       S-I distraction test: deferred today        Gait & Posture Assessment  Ambulation: Patient ambulates using a walker Gait: Relatively normal for age and body habitus Posture: WNL   Lower Extremity Exam    Side: Right lower extremity  Side: Left lower extremity  Stability: No instability observed          Stability: No instability observed          Skin & Extremity Inspection: Skin color, temperature, and hair growth are WNL. No peripheral edema or cyanosis. No masses, redness, swelling, asymmetry, or associated skin lesions. No contractures.  Skin & Extremity Inspection: Skin color, temperature, and hair growth are WNL. No peripheral edema or cyanosis. No masses, redness, swelling, asymmetry, or associated skin lesions. No contractures.  Functional ROM: Unrestricted ROM                  Functional ROM: Unrestricted ROM                  Muscle Tone/Strength: Functionally intact. No obvious neuro-muscular anomalies detected.  Muscle Tone/Strength: Functionally intact. No obvious neuro-muscular anomalies detected.  Sensory (Neurological): Unimpaired  Sensory (Neurological): Unimpaired  Palpation: No palpable anomalies  Palpation: No  palpable anomalies   Assessment  Primary  Diagnosis & Pertinent Problem List: The primary encounter diagnosis was Lumbar spondylosis. Diagnoses of Osteoarthritis of knee (Right), Arthritis, Chronic pain syndrome, and Long term prescription opiate use were also pertinent to this visit.  Status Diagnosis  Controlled Controlled Worsening 1. Lumbar spondylosis   2. Osteoarthritis of knee (Right)   3. Arthritis   4. Chronic pain syndrome   5. Long term prescription opiate use     Problems updated and reviewed during this visit: No problems updated. Plan of Care  Pharmacotherapy (Medications Ordered): Meds ordered this encounter  Medications  . oxyCODONE (OXY IR/ROXICODONE) 5 MG immediate release tablet    Sig: Take 1 tablet (5 mg total) by mouth every 6 (six) hours as needed for severe pain.    Dispense:  120 tablet    Refill:  0    Do not add this medication to the electronic "Automatic Refill" notification system. Patient may have prescription filled one day early if pharmacy is closed on scheduled refill date. Do not fill until: 09/17/2017 to last until: 10/17/2017    Order Specific Question:   Supervising Provider    Answer:   Milinda Pointer [phone]  . oxyCODONE (OXY IR/ROXICODONE) 5 MG immediate release tablet    Sig: Take 1 tablet (5 mg total) by mouth every 6 (six) hours as needed for severe pain.    Dispense:  120 tablet    Refill:  0    Do not add this medication to the electronic "Automatic Refill" notification system. Patient may have prescription filled one day early if pharmacy is closed on scheduled refill date. Do not fill until:08/18/2017 To last until: 09/17/2017    Order Specific Question:   Supervising Provider    Answer:   Milinda Pointer [phone]  . oxyCODONE (OXY IR/ROXICODONE) 5 MG immediate release tablet    Sig: Take 1 tablet (5 mg total) by mouth every 6 (six) hours as needed for severe pain.    Dispense:  120 tablet    Refill:  0    Do not add this  medication to the electronic "Automatic Refill" notification system. Patient may have prescription filled one day early if pharmacy is closed on scheduled refill date. Do not fill until: 07/19/2017 To last until:08/18/2017    Order Specific Question:   Supervising Provider    Answer:   Milinda Pointer [phone]   New Prescriptions   No medications on file   Medications administered today: [name] had no medications administered during this visit. Lab-work, procedure(s), and/or referral(s): Orders Placed This Encounter  Procedures  . ToxASSURE Select 13 (MW), Urine  . Ambulatory referral to Physical Therapy   Imaging and/or referral(s): AMB REFERRAL TO PHYSICAL THERAPY  Interventional therapies: Planned, scheduled, and/or pending:  Not at this time.   Considering:  Therapeutic series of 5 Hyalgan right knee injections  Diagnostic intra-articular left hip joint injection  Possible left hip radiofrequency ablation  Diagnostic right intra-articular knee joint injection with local anesthetic and steroid  Diagnostic bilateral genicular nerve block  Possible bilateral genicular nerve radiofrequency ablation  Diagnostic bilateral lumbar facet block  Possible bilateral lumbar facet radiofrequency ablation  Diagnostic bilateral L3-4 and/or L5-S1 transforaminal epidural steroid injections  Diagnostic L3-4 versus L4-5 lumbar epidural steroid injection    Palliative PRN treatment(s):  Therapeutic series of 5 Hyalgan right knee injections Diagnostic intra-articular left hip joint injection  Diagnostic right intra-articular knee joint injection with local anesthetic and steroid  Diagnostic bilateral genicular nerve block  Diagnostic bilateral lumbar facet  block  Diagnostic bilateral L3-4 and/or L5-S1 transforaminal epidural steroid injections  Diagnostic L3-4 versus L4-5 lumbar epidural steroid injection      Provider-requested follow-up: Return in about 3 months  (around 10/10/2017) for MedMgmt with Me Donella Stade Edison Pace).  Future Appointments  Date Time Provider [place]  08/03/2017  1:30 PM Elvin So, MD [facility] None  10/10/2017  1:45 PM Vevelyn Francois, NP [facility] None  12/21/2017 11:45 AM Allyne Gee, MD [place] None   Primary Care Physician: Glendon Axe, MD Location: [facility] Note by: Vevelyn Francois NP Date: 07/10/2017; Time: 4:12 PM  Pain Score Disclaimer: We use the NRS-11 scale. This is a self-reported, subjective measurement of pain severity with only modest accuracy. It is used primarily to identify changes within a particular patient. It must be understood that outpatient pain scales are significantly less accurate that those used for research, where they can be applied under ideal controlled circumstances with minimal exposure to variables. In reality, the score is likely to be a combination of pain intensity and pain affect, where pain affect describes the degree of emotional arousal or changes in action readiness caused by the sensory experience of pain. Factors such as social and work situation, setting, emotional state, anxiety levels, expectation, and prior pain experience may influence pain perception and show large inter-individual differences that may also be affected by time variables.  Patient instructions provided during this appointment: Patient Instructions   ____________________________________________________________________________________________  Medication Rules  Applies to: All patients receiving prescriptions (written or electronic).  Pharmacy of record: Pharmacy where electronic prescriptions will be sent. If written prescriptions are taken to a different pharmacy, please inform the nursing staff. The pharmacy listed in the electronic medical record should be the one where you would like electronic prescriptions to be sent.  Prescription refills: Only during  scheduled appointments. Applies to both, written and electronic prescriptions.  NOTE: The following applies primarily to controlled substances (Opioid* Pain Medications).   Patient's responsibilities: 1. Pain Pills: Bring all pain pills to every appointment (except for procedure appointments). 2. Pill Bottles: Bring pills in original pharmacy bottle. Always bring newest bottle. Bring bottle, even if empty. 3. Medication refills: You are responsible for knowing and keeping track of what medications you need refilled. The day before your appointment, write a list of all prescriptions that need to be refilled. Bring that list to your appointment and give it to the admitting nurse. Prescriptions will be written only during appointments. If you forget a medication, it will not be "Called in", "Faxed", or "electronically sent". You will need to get another appointment to get these prescribed. 4. Prescription Accuracy: You are responsible for carefully inspecting your prescriptions before leaving our office. Have the discharge nurse carefully go over each prescription with you, before taking them home. Make sure that your name is accurately spelled, that your address is correct. Check the name and dose of your medication to make sure it is accurate. Check the number of pills, and the written instructions to make sure they are clear and accurate. Make sure that you are given enough medication to last until your next medication refill appointment. 5. Taking Medication: Take medication as prescribed. Never take more pills than instructed. Never take medication more frequently than prescribed. Taking less pills or less frequently is permitted and encouraged, when it comes to controlled substances (written prescriptions).  6. Inform other Doctors: Always inform, all of your healthcare providers, of all the medications you take.  7. Pain Medication from other Providers: You are not allowed to accept any additional pain  medication from any other Doctor or Healthcare provider. There are two exceptions to this rule. (see below) In the event that you require additional pain medication, you are responsible for notifying us, as stated below. 8. Medication Agreement: You are responsible for carefully reading and following our Medication Agreement. This must be signed before receiving any prescriptions from our practice. Safely store a copy of your signed Agreement. Violations to the Agreement will result in no further prescriptions. (Additional copies of our Medication Agreement are available upon request.) 9. Laws, Rules, & Regulations: All patients are expected to follow all Federal and [employer], [employer], Rules, [employer]. Ignorance of the Laws does not constitute a valid excuse. The use of any illegal substances is prohibited. 10. Adopted CDC guidelines & recommendations: Target dosing levels will be at or below 60 MME/day. Use of benzodiazepines** is not recommended.  Exceptions: There are only two exceptions to the rule of not receiving pain medications from other Healthcare Providers. 1. Exception #1 (Emergencies): In the event of an emergency (i.e.: accident requiring emergency care), you are allowed to receive additional pain medication. However, you are responsible for: As soon as you are able, call our office [phone], at any time of the day or night, and leave a message stating your name, the date and nature of the emergency, and the name and dose of the medication prescribed. In the event that your call is answered by a member of our staff, make sure to document and save the date, time, and the name of the person that took your information.  2. Exception #2 (Planned Surgery): In the event that you are scheduled by another doctor or dentist to have any type of surgery or procedure, you are allowed (for a period no longer than 30 days), to receive additional pain medication, for the acute post-op pain.  However, in this case, you are responsible for picking up a copy of our "Post-op Pain Management for Surgeons" handout, and giving it to your surgeon or dentist. This document is available at our office, and does not require an appointment to obtain it. Simply go to our office during business hours (Monday-Thursday from 8:00 AM to 4:00 PM) (Friday 8:00 AM to 12:00 Noon) or if you have a scheduled appointment with [place], prior to your surgery, and ask for it by name. In addition, you will need to provide [place] with your name, name of your surgeon, type of surgery, and date of procedure or surgery.  *Opioid medications include: morphine, codeine, oxycodone, oxymorphone, hydrocodone, hydromorphone, meperidine, tramadol, tapentadol, buprenorphine, fentanyl, methadone. **Benzodiazepine medications include: diazepam (Valium), alprazolam (Xanax), clonazepam (Klonopine), lorazepam (Ativan), clorazepate (Tranxene), chlordiazepoxide (Librium), estazolam (Prosom), oxazepam (Serax), temazepam (Restoril), triazolam (Halcion) (Last updated: 03/09/2017) ____________________________________________________________________________________________   BMI Assessment: Estimated body mass index is 44.93 kg/m as calculated from the following:   Height as of this encounter: '5\' 5"'  (1.651 m).   Weight as of this encounter: 270 lb (122.5 kg).  BMI interpretation table: BMI level Category Range association with higher incidence of chronic pain  <18 kg/m2 Underweight   18.5-24.9 kg/m2 Ideal body weight   25-29.9 kg/m2 Overweight Increased incidence by 20%  30-34.9 kg/m2 Obese (Class I) Increased incidence by 68%  35-39.9 kg/m2 Severe obesity (Class II) Increased incidence by 136%  >40 kg/m2 Extreme obesity (Class III) Increased incidence by 254%   Patient's current BMI Ideal Body weight  Body  mass index is 44.93 kg/m. Ideal body weight: 57 kg (125 lb 10.6 oz) Adjusted ideal body weight: 83.2 kg (183 lb 6.4 oz)   BMI  Readings from Last 4 Encounters:  07/10/17 44.93 kg/m  06/19/17 44.93 kg/m  05/29/17 44.93 kg/m  05/23/17 44.93 kg/m   Wt Readings from Last 4 Encounters:  07/10/17 270 lb (122.5 kg)  06/19/17 270 lb (122.5 kg)  05/29/17 270 lb (122.5 kg)  05/23/17 270 lb (122.5 kg)

## 2017-07-10 NOTE — Patient Instructions (Addendum)
____________________________________________________________________________________________  Medication Rules  Applies to: All patients receiving prescriptions (written or electronic).  Pharmacy of record: Pharmacy where electronic prescriptions will be sent. If written prescriptions are taken to a different pharmacy, please inform the nursing staff. The pharmacy listed in the electronic medical record should be the one where you would like electronic prescriptions to be sent.  Prescription refills: Only during scheduled appointments. Applies to both, written and electronic prescriptions.  NOTE: The following applies primarily to controlled substances (Opioid* Pain Medications).   Patient's responsibilities: 1. Pain Pills: Bring all pain pills to every appointment (except for procedure appointments). 2. Pill Bottles: Bring pills in original pharmacy bottle. Always bring newest bottle. Bring bottle, even if empty. 3. Medication refills: You are responsible for knowing and keeping track of what medications you need refilled. The day before your appointment, write a list of all prescriptions that need to be refilled. Bring that list to your appointment and give it to the admitting nurse. Prescriptions will be written only during appointments. If you forget a medication, it will not be "Called in", "Faxed", or "electronically sent". You will need to get another appointment to get these prescribed. 4. Prescription Accuracy: You are responsible for carefully inspecting your prescriptions before leaving our office. Have the discharge nurse carefully go over each prescription with you, before taking them home. Make sure that your name is accurately spelled, that your address is correct. Check the name and dose of your medication to make sure it is accurate. Check the number of pills, and the written instructions to make sure they are clear and accurate. Make sure that you are given enough medication to last  until your next medication refill appointment. 5. Taking Medication: Take medication as prescribed. Never take more pills than instructed. Never take medication more frequently than prescribed. Taking less pills or less frequently is permitted and encouraged, when it comes to controlled substances (written prescriptions).  6. Inform other Doctors: Always inform, all of your healthcare providers, of all the medications you take. 7. Pain Medication from other Providers: You are not allowed to accept any additional pain medication from any other Doctor or Healthcare provider. There are two exceptions to this rule. (see below) In the event that you require additional pain medication, you are responsible for notifying us, as stated below. 8. Medication Agreement: You are responsible for carefully reading and following our Medication Agreement. This must be signed before receiving any prescriptions from our practice. Safely store a copy of your signed Agreement. Violations to the Agreement will result in no further prescriptions. (Additional copies of our Medication Agreement are available upon request.) 9. Laws, Rules, & Regulations: All patients are expected to follow all Federal and State Laws, Statutes, Rules, & Regulations. Ignorance of the Laws does not constitute a valid excuse. The use of any illegal substances is prohibited. 10. Adopted CDC guidelines & recommendations: Target dosing levels will be at or below 60 MME/day. Use of benzodiazepines** is not recommended.  Exceptions: There are only two exceptions to the rule of not receiving pain medications from other Healthcare Providers. 1. Exception #1 (Emergencies): In the event of an emergency (i.e.: accident requiring emergency care), you are allowed to receive additional pain medication. However, you are responsible for: As soon as you are able, call our office [phone], at any time of the day or night, and leave a message stating your name, the  date and nature of the emergency, and the name and dose of the medication   prescribed. In the event that your call is answered by a member of our staff, make sure to document and save the date, time, and the name of the person that took your information.  2. Exception #2 (Planned Surgery): In the event that you are scheduled by another doctor or dentist to have any type of surgery or procedure, you are allowed (for a period no longer than 30 days), to receive additional pain medication, for the acute post-op pain. However, in this case, you are responsible for picking up a copy of our "Post-op Pain Management for Surgeons" handout, and giving it to your surgeon or dentist. This document is available at our office, and does not require an appointment to obtain it. Simply go to our office during business hours (Monday-Thursday from 8:00 AM to 4:00 PM) (Friday 8:00 AM to 12:00 Noon) or if you have a scheduled appointment with [place], prior to your surgery, and ask for it by name. In addition, you will need to provide [place] with your name, name of your surgeon, type of surgery, and date of procedure or surgery.  *Opioid medications include: morphine, codeine, oxycodone, oxymorphone, hydrocodone, hydromorphone, meperidine, tramadol, tapentadol, buprenorphine, fentanyl, methadone. **Benzodiazepine medications include: diazepam (Valium), alprazolam (Xanax), clonazepam (Klonopine), lorazepam (Ativan), clorazepate (Tranxene), chlordiazepoxide (Librium), estazolam (Prosom), oxazepam (Serax), temazepam (Restoril), triazolam (Halcion) (Last updated: 03/09/2017) ____________________________________________________________________________________________   BMI Assessment: Estimated body mass index is 44.93 kg/m as calculated from the following:   Height as of this encounter: 5\' 5"  (1.651 m).   Weight as of this encounter: 270 lb (122.5 kg).  BMI interpretation table: BMI level Category Range association with higher  incidence of chronic pain  <18 kg/m2 Underweight   18.5-24.9 kg/m2 Ideal body weight   25-29.9 kg/m2 Overweight Increased incidence by 20%  30-34.9 kg/m2 Obese (Class I) Increased incidence by 68%  35-39.9 kg/m2 Severe obesity (Class II) Increased incidence by 136%  >40 kg/m2 Extreme obesity (Class III) Increased incidence by 254%   Patient's current BMI Ideal Body weight  Body mass index is 44.93 kg/m. Ideal body weight: 57 kg (125 lb 10.6 oz) Adjusted ideal body weight: 83.2 kg (183 lb 6.4 oz)   BMI Readings from Last 4 Encounters:  07/10/17 44.93 kg/m  06/19/17 44.93 kg/m  05/29/17 44.93 kg/m  05/23/17 44.93 kg/m   Wt Readings from Last 4 Encounters:  07/10/17 270 lb (122.5 kg)  06/19/17 270 lb (122.5 kg)  05/29/17 270 lb (122.5 kg)  05/23/17 270 lb (122.5 kg)

## 2017-07-28 ENCOUNTER — Other Ambulatory Visit: Payer: Self-pay | Admitting: Psychiatry

## 2017-08-01 ENCOUNTER — Other Ambulatory Visit: Payer: Self-pay | Admitting: Psychiatry

## 2017-08-01 ENCOUNTER — Telehealth: Payer: Self-pay

## 2017-08-01 MED ORDER — QUETIAPINE FUMARATE 50 MG PO TABS: 50.0000 mg | ORAL_TABLET | Freq: Two times a day (BID) | ORAL | 0 refills | Status: DC

## 2017-08-01 MED ORDER — DULOXETINE HCL 30 MG PO CPEP: ORAL_CAPSULE | ORAL | 0 refills | Status: DC

## 2017-08-01 MED ORDER — QUETIAPINE FUMARATE 200 MG PO TABS: 200.0000 mg | ORAL_TABLET | Freq: Every day | ORAL | 0 refills | Status: DC

## 2017-08-01 NOTE — Telephone Encounter (Signed)
Prescriptions printed

## 2017-08-01 NOTE — Telephone Encounter (Signed)
faxed and confirmed rx for cymbalta 30mg  id # order # [id number]  , seroquel 200mg  id # [id number] order # , seroquel 50mg  id [id number] order # [id number]

## 2017-08-01 NOTE — Telephone Encounter (Signed)
need a refill on medication pt states she doesnt have enough medication to due until she see you this week.   QUEtiapine (SEROQUEL) 200 MG tablet  Medication  Date: 04/20/2017 Department: [facility] Ordering/Authorizing: Jomarie Longs, MD  Order Providers   Prescribing Provider Encounter Provider  Jomarie Longs, MD Jomarie Longs, MD  Outpatient Medication Detail    Disp Refills Start End   QUEtiapine (SEROQUEL) 200 MG tablet 90 tablet 0 04/20/2017    Sig - Route: Take 1 tablet (200 mg total) by mouth at bedtime. - Oral   Sent to pharmacy as: QUEtiapine (SEROQUEL) 200 MG tablet   E-Prescribing Status: Receipt confirmed by pharmacy (04/20/2017 3:34 PM EDT)     QUEtiapine (SEROQUEL) 50 MG tablet  Medication  Date: 04/20/2017 Department: [facility] Ordering/Authorizing: Jomarie Longs, MD  Order Providers   Prescribing Provider Encounter Provider  Jomarie Longs, MD Jomarie Longs, MD  Outpatient Medication Detail    Disp Refills Start End   QUEtiapine (SEROQUEL) 50 MG tablet 180 tablet 0 04/20/2017    Sig - Route: Take 1 tablet (50 mg total) by mouth 2 (two) times daily. - Oral   Sent to pharmacy as: QUEtiapine (SEROQUEL) 50 MG tablet   E-Prescribing Status: Receipt confirmed by pharmacy (04/20/2017 3:34 PM EDT)

## 2017-08-03 ENCOUNTER — Other Ambulatory Visit: Payer: Self-pay

## 2017-08-03 ENCOUNTER — Encounter: Payer: Self-pay | Admitting: Psychiatry

## 2017-08-03 ENCOUNTER — Ambulatory Visit (INDEPENDENT_AMBULATORY_CARE_PROVIDER_SITE_OTHER): Payer: Medicare Other | Admitting: Psychiatry

## 2017-08-03 VITALS — BP 127/81 | HR 90 | Temp 98.5°F | Wt 272.8 lb

## 2017-08-03 DIAGNOSIS — F3340 Major depressive disorder, recurrent, in remission, unspecified: Secondary | ICD-10-CM | POA: Diagnosis not present

## 2017-08-03 NOTE — Progress Notes (Signed)
Patient ID: Rollande Thursby, female   DOB: 06-15-1945, 72 y.o.   MRN: [id number]  [facility] MD/PA/NP OP Progress Note  08/03/2017 1:44 PM Emmani Lesueur  MRN:  [id number]  Subjective:  Patient is a 72 year old Caucasian female with a long history of depression and insomnia.  States that mood wise she is doing well. She went to a wedding in [place]  And enjoyed seeing her family. Taking her medications regularly. Sleeping well and eating well. Denies any problems.  Visit Diagnosis:     ICD-10-CM   1. Recurrent major depressive disorder, in remission (HCC) F33.40     Past Medical History:  Past Medical History:  Diagnosis Date  . Allergy   . Anxiety   . Arthritis, degenerative 10/08/2013   Overview:    a.  Lumbar spine/spinal stenosis/foot drop.   b.  Hands.   . Chronic kidney disease   . Depression   . 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 Major depression, single episode, in complete remission (HCC) 06/25/2015  . Memory loss, short term 03/19/2014  . Seizure (HCC) 10/07/2014  . Sleep apnea   . Thyroid disease     Past Surgical History:  Procedure Laterality Date  . ABDOMINAL HYSTERECTOMY    . CESAREAN SECTION    . HIP SURGERY Right   . REPLACEMENT TOTAL KNEE Left    Family History:  Family History  Problem Relation Age of Onset  . Hypertension Mother   . Stroke Mother   . Heart attack Father   . Alcohol abuse Father   . Depression Father   . Post-traumatic stress disorder Father   . Rheum arthritis Sister   . Hypertension Sister   . Depression Sister    Social History:  Social History   Socioeconomic History  . Marital status: Married    Spouse name: Not on file  . Number of children: Not on file  . Years of education: Not on file  . Highest education level: Not on file  Occupational History    Comment: retired  Engineer, production  . Financial resource strain: Not hard at all  . Food insecurity:    Worry: Never true    Inability: Never  true  . Transportation needs:    Medical: No    Non-medical: No  Tobacco Use  . Smoking status: Never Smoker  . Smokeless tobacco: Never Used  Substance and Sexual Activity  . Alcohol use: No    Alcohol/week: 0.0 oz  . Drug use: No  . Sexual activity: Not Currently  Lifestyle  . Physical activity:    Days per week: 0 days    Minutes per session: 0 min  . Stress: Not at all  Relationships  . Social connections:    Talks on phone: More than three times a week    Gets together: Once a week    Attends religious service: Never    Active member of club or organization: No    Attends meetings of clubs or organizations: Never    Relationship status: Married  Other Topics Concern  . Not on file  Social History Narrative  . Not on file   Additional History:   Assessment:   Musculoskeletal: Strength & Muscle Tone: within normal limits Gait & Station: normal Patient leans: N/A  Psychiatric Specialty Exam: Medication Refill     Review of Systems  Psychiatric/Behavioral: Negative for depression, hallucinations, memory loss, substance abuse and suicidal ideas. The patient is not nervous/anxious and does  not have insomnia (Avis complaints have improved and she attributes it to  Seroquel).   All other systems reviewed and are negative.    There were no vitals taken for this visit.There is no height or weight on file to calculate BMI.  General Appearance: Well Groomed  Eye Contact:  Good  Speech:  Normal Rate  Volume:  Normal  Mood:  Good  Affect:  Congruent  Thought Process:  Linear and Logical  Orientation:  Full (Time, Place, and Person)  Thought Content:  Negative  Suicidal Thoughts:  No  Homicidal Thoughts:  No  Memory:  Immediate;   Good Recent;   Good Remote;   Good  Judgement:  Good  Insight:  Good  Psychomotor Activity:  Negative  Concentration:  Good  Recall:  Good  Fund of Knowledge: Good  Language: Good  Akathisia:  Negative  Handed:    AIMS (if  indicated):  N/A  Assets:  Communication Skills Desire for Improvement Social Support  ADL's:  Intact  Cognition: WNL  Sleep:  fair   Is the patient at risk to self?  No. Has the patient been a risk to self in the past 6 months?  No. Has the patient been a risk to self within the distant past?  No. Is the patient a risk to others?  No. Has the patient been a risk to others in the past 6 months?  No. Has the patient been a risk to others within the distant past?  No.  Current Medications: Current Outpatient Medications  Medication Sig Dispense Refill  . amLODipine (NORVASC) 10 MG tablet Take 10 mg by mouth 2 (two) times daily.   11  . aspirin EC 81 MG tablet Take by mouth.    . benzonatate (TESSALON PERLES) 100 MG capsule Take 1 capsule (100 mg total) by mouth every 6 (six) hours as needed for cough. 30 capsule 0  . chlorthalidone (HYGROTON) 25 MG tablet TAKE 1/2 TABLET (12.5MG ) BY MOUTH EVERY DAY  11  . Cholecalciferol (VITAMIN D) 2000 UNITS tablet Take by mouth daily.     Marland Kitchen docusate sodium (COLACE) 100 MG capsule Take 100 mg by mouth 2 (two) times daily.    . DULoxetine (CYMBALTA) 30 MG capsule Two tablets in the morning, and one in the evening. 270 capsule 0  . esomeprazole (NEXIUM) 20 MG capsule Take by mouth daily.     . Fluticasone-Umeclidin-Vilant (TRELEGY ELLIPTA) 100-62.5-25 MCG/INH AEPB Inhale 1 puff into the lungs daily. 1 each 4  . folic acid (FOLVITE) 1 MG tablet Take 1 mg by mouth daily.    . furosemide (LASIX) 40 MG tablet TAKE 1 TABLET ONCE DAILY    . hydroxychloroquine (PLAQUENIL) 200 MG tablet Take 200 mg by mouth daily.     . InFLIXimab-dyyb (INFLECTRA) 100 MG SOLR Inject into the vein every 6 (six) weeks.     Marland Kitchen levothyroxine (SYNTHROID, LEVOTHROID) 200 MCG tablet TAKE 1 TABLET ONCE DAILY- ON AN EMPTY STOMACH WITH A GLASS OF WATER 30-60 MINUTES BEFORE BREAKFAST  5  . loratadine (CLARITIN) 10 MG tablet Take by mouth daily as needed.     . methotrexate (RHEUMATREX)  2.5 MG tablet TAKE 4 TABLETS (10 MG TOTAL) BY MOUTH EVERY 7 (SEVEN) DAYS WITH A MEAL  5  . metoprolol succinate (TOPROL-XL) 50 MG 24 hr tablet TAKE 1 TABLET (50 MG TOTAL) BY MOUTH ONCE DAILY.  1  . Multiple Vitamin (MULTI-VITAMINS) TABS Take by mouth.    [name]  ON 09/17/2017] oxyCODONE (OXY IR/ROXICODONE) 5 MG immediate release tablet Take 1 tablet (5 mg total) by mouth every 6 (six) hours as needed for severe pain. 120 tablet 0  . [START ON 08/18/2017] oxyCODONE (OXY IR/ROXICODONE) 5 MG immediate release tablet Take 1 tablet (5 mg total) by mouth every 6 (six) hours as needed for severe pain. 120 tablet 0  . oxyCODONE (OXY IR/ROXICODONE) 5 MG immediate release tablet Take 1 tablet (5 mg total) by mouth every 6 (six) hours as needed for severe pain. 120 tablet 0  . pravastatin (PRAVACHOL) 10 MG tablet TAKE 1 TABLET BY MOUTH EVERY DAY AT NIGHT  5  . QUEtiapine (SEROQUEL) 200 MG tablet Take 1 tablet (200 mg total) by mouth at bedtime. 90 tablet 0  . QUEtiapine (SEROQUEL) 50 MG tablet Take 1 tablet (50 mg total) by mouth 2 (two) times daily. 180 tablet 0  . Spacer/Aero Chamber Mouthpiece MISC 1 Units by Does not apply route every 4 (four) hours as needed (wheezing). 1 each 0  . spironolactone (ALDACTONE) 25 MG tablet TAKE 1/2 (ONE-HALF) TABLET BY MOUTH DAILY  11   No current facility-administered medications for this visit.     Medical Decision Making:  Established Problem, Stable/Improving (1), Review of Medication Regimen & Side Effects (2) and Review of New Medication or Change in Dosage (2)  Treatment Plan Summary:Medication management and Plan    Major depressive disorder, recurrent, moderate-patient will continue her Cymbalta 60 mg in the morning and 30 mg in the evening.   Insomnia- Continue Seroquel at 200 mg at bedtime.   Anxiety- take Seroquel at 50 mg twice daily. If she gets too sedated in the morning to shift change that to bedtime.   RTC to clinic in 3 months time or call before  if necessary.  Marilea Gwynne 08/03/2017, 1:44 PM

## 2017-10-09 DIAGNOSIS — Z6841 Body Mass Index (BMI) 40.0 and over, adult: Secondary | ICD-10-CM | POA: Insufficient documentation

## 2017-10-10 ENCOUNTER — Encounter: Payer: PRIVATE HEALTH INSURANCE | Admitting: Nurse Practitioner

## 2017-10-11 ENCOUNTER — Encounter: Payer: PRIVATE HEALTH INSURANCE | Admitting: Nurse Practitioner

## 2017-10-17 ENCOUNTER — Ambulatory Visit: Payer: Self-pay | Admitting: Internal Medicine

## 2017-10-18 ENCOUNTER — Ambulatory Visit: Payer: Medicare Other | Attending: Nurse Practitioner | Admitting: Nurse Practitioner

## 2017-10-18 ENCOUNTER — Encounter: Payer: Self-pay | Admitting: Nurse Practitioner

## 2017-10-18 VITALS — BP 93/52 | HR 92 | Temp 98.0°F | Resp 16 | Ht 65.0 in | Wt 267.0 lb

## 2017-10-18 DIAGNOSIS — M48062 Spinal stenosis, lumbar region with neurogenic claudication: Secondary | ICD-10-CM | POA: Insufficient documentation

## 2017-10-18 DIAGNOSIS — E039 Hypothyroidism, unspecified: Secondary | ICD-10-CM | POA: Insufficient documentation

## 2017-10-18 DIAGNOSIS — N183 Chronic kidney disease, stage 3 (moderate): Secondary | ICD-10-CM | POA: Insufficient documentation

## 2017-10-18 DIAGNOSIS — E1122 Type 2 diabetes mellitus with diabetic chronic kidney disease: Secondary | ICD-10-CM | POA: Insufficient documentation

## 2017-10-18 DIAGNOSIS — Z880 Allergy status to penicillin: Secondary | ICD-10-CM | POA: Diagnosis not present

## 2017-10-18 DIAGNOSIS — M25552 Pain in left hip: Secondary | ICD-10-CM

## 2017-10-18 DIAGNOSIS — M25561 Pain in right knee: Secondary | ICD-10-CM | POA: Diagnosis not present

## 2017-10-18 DIAGNOSIS — M47816 Spondylosis without myelopathy or radiculopathy, lumbar region: Secondary | ICD-10-CM

## 2017-10-18 DIAGNOSIS — M160 Bilateral primary osteoarthritis of hip: Secondary | ICD-10-CM | POA: Diagnosis not present

## 2017-10-18 DIAGNOSIS — Z79891 Long term (current) use of opiate analgesic: Secondary | ICD-10-CM | POA: Diagnosis not present

## 2017-10-18 DIAGNOSIS — M170 Bilateral primary osteoarthritis of knee: Secondary | ICD-10-CM | POA: Diagnosis not present

## 2017-10-18 DIAGNOSIS — M549 Dorsalgia, unspecified: Secondary | ICD-10-CM | POA: Diagnosis not present

## 2017-10-18 DIAGNOSIS — M79641 Pain in right hand: Secondary | ICD-10-CM | POA: Insufficient documentation

## 2017-10-18 DIAGNOSIS — I130 Hypertensive heart and chronic kidney disease with heart failure and stage 1 through stage 4 chronic kidney disease, or unspecified chronic kidney disease: Secondary | ICD-10-CM | POA: Diagnosis not present

## 2017-10-18 DIAGNOSIS — Z6841 Body Mass Index (BMI) 40.0 and over, adult: Secondary | ICD-10-CM | POA: Insufficient documentation

## 2017-10-18 DIAGNOSIS — Z7982 Long term (current) use of aspirin: Secondary | ICD-10-CM | POA: Diagnosis not present

## 2017-10-18 DIAGNOSIS — Z79899 Other long term (current) drug therapy: Secondary | ICD-10-CM | POA: Insufficient documentation

## 2017-10-18 DIAGNOSIS — Z5181 Encounter for therapeutic drug level monitoring: Secondary | ICD-10-CM | POA: Diagnosis present

## 2017-10-18 DIAGNOSIS — M797 Fibromyalgia: Secondary | ICD-10-CM | POA: Insufficient documentation

## 2017-10-18 DIAGNOSIS — G894 Chronic pain syndrome: Secondary | ICD-10-CM

## 2017-10-18 DIAGNOSIS — M79642 Pain in left hand: Secondary | ICD-10-CM | POA: Insufficient documentation

## 2017-10-18 DIAGNOSIS — Z8249 Family history of ischemic heart disease and other diseases of the circulatory system: Secondary | ICD-10-CM | POA: Diagnosis not present

## 2017-10-18 DIAGNOSIS — G8929 Other chronic pain: Secondary | ICD-10-CM

## 2017-10-18 MED ORDER — OXYCODONE HCL 5 MG PO TABS: 5.0000 mg | ORAL_TABLET | Freq: Four times a day (QID) | ORAL | 0 refills | Status: DC | PRN

## 2017-10-18 NOTE — Patient Instructions (Addendum)
____________________________________________________________________________________________  Medication Rules  Applies to: All patients receiving prescriptions (written or electronic).  Pharmacy of record: Pharmacy where electronic prescriptions will be sent. If written prescriptions are taken to a different pharmacy, please inform the nursing staff. The pharmacy listed in the electronic medical record should be the one where you would like electronic prescriptions to be sent.  Prescription refills: Only during scheduled appointments. Applies to both, written and electronic prescriptions.  NOTE: The following applies primarily to controlled substances (Opioid* Pain Medications).   Patient's responsibilities: 1. Pain Pills: Bring all pain pills to every appointment (except for procedure appointments). 2. Pill Bottles: Bring pills in original pharmacy bottle. Always bring newest bottle. Bring bottle, even if empty. 3. Medication refills: You are responsible for knowing and keeping track of what medications you need refilled. The day before your appointment, write a list of all prescriptions that need to be refilled. Bring that list to your appointment and give it to the admitting nurse. Prescriptions will be written only during appointments. If you forget a medication, it will not be "Called in", "Faxed", or "electronically sent". You will need to get another appointment to get these prescribed. 4. Prescription Accuracy: You are responsible for carefully inspecting your prescriptions before leaving our office. Have the discharge nurse carefully go over each prescription with you, before taking them home. Make sure that your name is accurately spelled, that your address is correct. Check the name and dose of your medication to make sure it is accurate. Check the number of pills, and the written instructions to make sure they are clear and accurate. Make sure that you are given enough medication to last  until your next medication refill appointment. 5. Taking Medication: Take medication as prescribed. Never take more pills than instructed. Never take medication more frequently than prescribed. Taking less pills or less frequently is permitted and encouraged, when it comes to controlled substances (written prescriptions).  6. Inform other Doctors: Always inform, all of your healthcare providers, of all the medications you take. 7. Pain Medication from other Providers: You are not allowed to accept any additional pain medication from any other Doctor or Healthcare provider. There are two exceptions to this rule. (see below) In the event that you require additional pain medication, you are responsible for notifying us, as stated below. 8. Medication Agreement: You are responsible for carefully reading and following our Medication Agreement. This must be signed before receiving any prescriptions from our practice. Safely store a copy of your signed Agreement. Violations to the Agreement will result in no further prescriptions. (Additional copies of our Medication Agreement are available upon request.) 9. Laws, Rules, & Regulations: All patients are expected to follow all Federal and State Laws, Statutes, Rules, & Regulations. Ignorance of the Laws does not constitute a valid excuse. The use of any illegal substances is prohibited. 10. Adopted CDC guidelines & recommendations: Target dosing levels will be at or below 60 MME/day. Use of benzodiazepines** is not recommended.  Exceptions: There are only two exceptions to the rule of not receiving pain medications from other Healthcare Providers. 1. Exception #1 (Emergencies): In the event of an emergency (i.e.: accident requiring emergency care), you are allowed to receive additional pain medication. However, you are responsible for: As soon as you are able, call our office [phone], at any time of the day or night, and leave a message stating your name, the  date and nature of the emergency, and the name and dose of the medication   prescribed. In the event that your call is answered by a member of our staff, make sure to document and save the date, time, and the name of the person that took your information.  2. Exception #2 (Planned Surgery): In the event that you are scheduled by another doctor or dentist to have any type of surgery or procedure, you are allowed (for a period no longer than 30 days), to receive additional pain medication, for the acute post-op pain. However, in this case, you are responsible for picking up a copy of our "Post-op Pain Management for Surgeons" handout, and giving it to your surgeon or dentist. This document is available at our office, and does not require an appointment to obtain it. Simply go to our office during business hours (Monday-Thursday from 8:00 AM to 4:00 PM) (Friday 8:00 AM to 12:00 Noon) or if you have a scheduled appointment with [place], prior to your surgery, and ask for it by name. In addition, you will need to provide [place] with your name, name of your surgeon, type of surgery, and date of procedure or surgery.  *Opioid medications include: morphine, codeine, oxycodone, oxymorphone, hydrocodone, hydromorphone, meperidine, tramadol, tapentadol, buprenorphine, fentanyl, methadone. **Benzodiazepine medications include: diazepam (Valium), alprazolam (Xanax), clonazepam (Klonopine), lorazepam (Ativan), clorazepate (Tranxene), chlordiazepoxide (Librium), estazolam (Prosom), oxazepam (Serax), temazepam (Restoril), triazolam (Halcion) (Last updated: 03/09/2017) ____________________________________________________________________________________________   BMI Assessment: Estimated body mass index is 44.43 kg/m as calculated from the following:   Height as of this encounter: 5\' 5"  (1.651 m).   Weight as of this encounter: 267 lb (121.1 kg).  BMI interpretation table: BMI level Category Range association with higher  incidence of chronic pain  <18 kg/m2 Underweight   18.5-24.9 kg/m2 Ideal body weight   25-29.9 kg/m2 Overweight Increased incidence by 20%  30-34.9 kg/m2 Obese (Class I) Increased incidence by 68%  35-39.9 kg/m2 Severe obesity (Class II) Increased incidence by 136%  >40 kg/m2 Extreme obesity (Class III) Increased incidence by 254%   Patient's current BMI Ideal Body weight  Body mass index is 44.43 kg/m. Ideal body weight: 57 kg (125 lb 10.6 oz) Adjusted ideal body weight: 82.6 kg (182 lb 3.2 oz)   BMI Readings from Last 4 Encounters:  10/18/17 44.43 kg/m  07/10/17 44.93 kg/m  06/19/17 44.93 kg/m  05/29/17 44.93 kg/m   Wt Readings from Last 4 Encounters:  10/18/17 267 lb (121.1 kg)  07/10/17 270 lb (122.5 kg)  06/19/17 270 lb (122.5 kg)  05/29/17 270 lb (122.5 kg)   Oxycodone 5 mg x 3 months to begin filling today 10/18/17 escribed for patient.

## 2017-10-18 NOTE — Progress Notes (Signed)
[name]  MRN: [id number]  Referring Provider: Glendon Axe, MD  DOB: [birth year]  PCP: Glendon Axe, MD  DOS: 10/18/2017  Note by: Vevelyn Francois NP  Service setting: Ambulatory outpatient  Specialty: Interventional Pain Management  Location: [facility] (AMB) Pain Management Facility    Patient type: Established    Primary Reason(s) for Visit: Encounter for prescription drug management. (Level of risk: moderate)  CC: Foot Pain (bilateral); Hand Pain (bilateral); Joint Pain (RA); and Back Pain (lumbar, center)  HPI  Laura [name] is a 72 y.o. year old, female patient, who comes today for a medication management evaluation. She has CKD (chronic kidney disease) stage 3, GFR 30-59 ml/min ([place]); Depressive disorder; Essential (primary) hypertension; GERD (gastroesophageal reflux disease); Hypothyroidism; Hypercholesterolemia; Extreme obesity ([place]); Insomnia secondary to chronic pain; Congestive heart failure with left ventricular systolic dysfunction ([place]); Type 2 diabetes mellitus ([place]); Morbid obesity ([place]); Seronegative rheumatoid arthritis ([place]); Chronic knee pain (Right); Opiate use (30 MME/Day); Long term current use of opiate analgesic; Long term prescription opiate use; Encounter for therapeutic drug level monitoring; Opioid dependence ([place]); Lumbar spinal stenosis (5 mm Severe L3-4; 8 mm L4-5); Lumbar spondylosis; Lumbar facet syndrome (Location of Primary Source of Pain) (Bilateral) (L>R); Chronic pain syndrome; Chronic low back pain (Location of Primary Source of Pain) (Bilateral) (L>R); History of TKR (total knee replacement) (Left); History of femur fracture (Right); 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); 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); Lumbar foraminal stenosis (Bilateral L3-4 and L5-S1); Chronic hip pain (Left); Disturbance of skin sensation; Elevated sedimentation rate; Elevated C-reactive protein (CRP); GAD (generalized anxiety disorder); Anxiety;  Diet-controlled diabetes mellitus ([place]); Osteoarthritis of knee (Right); Opioid-induced constipation ([place]); Arthritis; Dyspnea on exertion; Kidney disease; Primary fibromyalgia syndrome; Snoring; Morbid obesity with BMI of 40.0-44.9, adult ([place]); Chronic arthralgias of knees and hips (Right); Postmenopausal; and Body mass index (BMI) of 40.0-44.9 in adult [facility]) on their problem list. Her primarily concern today is the Foot Pain (bilateral); Hand Pain (bilateral); Joint Pain (RA); and Back Pain (lumbar, center)  Pain Assessment: Location: Lower, Left, Right Back Radiating: back pain into right hip.  s/p fx approx 20 years ago, hardware in place  Onset: More than a month ago Duration: Chronic pain Quality: Discomfort, Aching, Constant Severity: 3 /10 (subjective, self-reported pain score)  Note: Reported level is compatible with observation.                          Effect on ADL: when she walks it hurts more.  going up steps into the house is difficult, can't raise right foot high enough  Timing: Constant Modifying factors: medications help, is out currently  BP: (!) 93/52  HR: 92  Laura [name] was last scheduled for an appointment on 07/10/2017 for medication management. During today's appointment we reviewed Laura [name]'s chronic pain status, as well as her outpatient medication regimen.  She admits that her pain is stable.  She denies any current problems or concerns.  The patient  reports that she does not use drugs. Her body mass index is 44.43 kg/m.  Further details on both, my assessment(s), as well as the proposed treatment plan, please see below.  Controlled Substance Pharmacotherapy Assessment REMS (Risk Evaluation and Mitigation Strategy)  Analgesic:Oxycodone IR 5 mg every 6 hours (20 mg/day) MME/day:30 mg/day Janett Billow, RN  10/18/2017  2:20 PM  Sign at close encounter Nursing Pain Medication Assessment:  Safety precautions to be maintained throughout the outpatient  stay  will include: orient to surroundings, keep bed in low position, maintain call bell within reach at all times, provide assistance with transfer out of bed and ambulation.  Medication Inspection Compliance: Laura [name] did not comply with our request to bring her pills to be counted. She was reminded that bringing the medication bottles, even when empty, is a requirement.  Medication: None brought in. Pill/Patch Count: None available to be counted. Bottle Appearance: No container available. Did not bring bottle(s) to appointment. Filled Date: N/A Last Medication intake:  Yesterday    Patient reports that she thinks she has 1 or 2 pills left.    Pharmacokinetics: Liberation and absorption (onset of action): WNL Distribution (time to peak effect): WNL Metabolism and excretion (duration of action): WNL         Pharmacodynamics: Desired effects: Analgesia: Laura [name] reports >50% benefit. Functional ability: Patient reports that medication allows her to accomplish basic ADLs Clinically meaningful improvement in function (CMIF): Sustained CMIF goals met Perceived effectiveness: Described as relatively effective, allowing for increase in activities of daily living (ADL) Undesirable effects: Side-effects or Adverse reactions: None reported Monitoring: [place] PMP: Online review of the past 88-monthperiod conducted. Compliant with practice rules and regulations Last UDS on record: Summary  Date Value Ref Range Status  04/13/2017 FINAL  Final    Comment:    ==================================================================== TOXASSURE SELECT 13 (MW) ==================================================================== Test                             Result       Flag       Units Drug Present and Declared for Prescription Verification   Oxycodone                      243          EXPECTED   ng/mg creat   Noroxycodone                   1355         EXPECTED   ng/mg creat    Sources of  oxycodone include scheduled prescription medications.    Noroxycodone is an expected metabolite of oxycodone. Drug Present not Declared for Prescription Verification   Hydrocodone                    1751         UNEXPECTED ng/mg creat   Dihydrocodeine                 539          UNEXPECTED ng/mg creat   Norhydrocodone                 1161         UNEXPECTED ng/mg creat    Sources of hydrocodone include scheduled prescription    medications. Dihydrocodeine and norhydrocodone are expected    metabolites of hydrocodone. Dihydrocodeine is also available as a    scheduled prescription medication. ==================================================================== Test                      Result    Flag   Units      Ref Range   Creatinine              75               mg/dL      >=20 ====================================================================  Declared Medications:  The flagging and interpretation on this report are based on the  following declared medications.  Unexpected results may arise from  inaccuracies in the declared medications.  **Note: The testing scope of this panel includes these medications:  Oxycodone (Oxy-IR)  **Note: The testing scope of this panel does not include following  reported medications:  Amlodipine (Norvasc)  Aspirin  Chlorthalidone  Docusate (Colace)  Duloxetine (Cymbalta)  Folic acid (Folvite)  Furosemide (Lasix)  Hydroxychloroquine (Plaquenil)  Levothyroxine  Loratadine (Claritin)  Lubiprostone (Amitiza)  Methotrexate  Metoprolol (Toprol)  Multivitamin  Omeprazole (Nexium)  Quetiapine (Seroquel)  Spironolactone (Aldactone)  Vitamin D ==================================================================== For clinical consultation, please call [phone]. ====================================================================    UDS interpretation: Non-Compliant The patient was given a final warning about the accuracy of reporting  medications. Medication Assessment Form: Discrepancies found between patient's report and information collected Treatment compliance: Deficiencies noted and steps taken to remind the patient of the seriousness of adequate therapy compliance Risk Assessment Profile: Aberrant behavior: use of someone else's medication Comorbid factors increasing risk of overdose: See prior notes. No additional risks detected today Opioid risk tool (ORT) (Total Score): 1 Personal History of Substance Abuse (SUD-Substance use disorder):  Alcohol: Negative  Illegal Drugs: Negative  Rx Drugs: Negative  ORT Risk Level calculation: Low Risk Risk of substance use disorder (SUD): Moderate Opioid Risk Tool - 10/18/17 1419      Family History of Substance Abuse   Alcohol  Positive Female    Illegal Drugs  Negative    Rx Drugs  Negative      Personal History of Substance Abuse   Alcohol  Negative    Illegal Drugs  Negative    Rx Drugs  Negative      Psychological Disease   Psychological Disease  Negative    Depression  Negative      Total Score   Opioid Risk Tool Scoring  1    Opioid Risk Interpretation  Low Risk      ORT Scoring interpretation table:  Score <3 = Low Risk for SUD  Score between 4-7 = Moderate Risk for SUD  Score >8 = High Risk for Opioid Abuse   Risk Mitigation Strategies:  Patient Counseling: Covered Patient-Prescriber Agreement (PPA): Present and active  Notification to other healthcare providers: Done  Pharmacologic Plan: No change in therapy, at this time.             Laboratory Chemistry  Inflammation Markers (CRP: Acute Phase) (ESR: Chronic Phase) Lab Results  Component Value Date   CRP 1.9 (H) 07/15/2015   ESRSEDRATE 32 (H) 07/15/2015                         Rheumatology Markers No results found for: RF, ANA, LABURIC, URICUR, LYMEIGGIGMAB, LYMEABIGMQN, HLAB27                      Renal Function Markers Lab Results  Component Value Date   BUN 36 (H) 05/23/2017    CREATININE 1.63 (H) 05/23/2017   GFRAA 35 (L) 05/23/2017   GFRNONAA 30 (L) 05/23/2017                             Hepatic Function Markers Lab Results  Component Value Date   AST 19 07/15/2015   ALT 15 07/15/2015   ALBUMIN 4.0 07/15/2015   ALKPHOS 73 07/15/2015  Electrolytes Lab Results  Component Value Date   NA 134 (L) 05/23/2017   K 2.8 (L) 05/23/2017   CL 87 (L) 05/23/2017   CALCIUM 9.1 05/23/2017   MG 2.1 07/15/2015                        Neuropathy Markers Lab Results  Component Value Date   VITAMINB12 610 07/15/2015                        CNS Tests No results found for: COLORCSF, APPEARCSF, RBCCOUNTCSF, WBCCSF, POLYSCSF, LYMPHSCSF, EOSCSF, PROTEINCSF, GLUCCSF, JCVIRUS, CSFOLI, IGGCSF                      Bone Pathology Markers Lab Results  Component Value Date   25OHVITD1 36 07/15/2015   25OHVITD2 3.6 07/15/2015   25OHVITD3 32 07/15/2015                         Coagulation Parameters Lab Results  Component Value Date   PLT 305 05/23/2017                        Cardiovascular Markers Lab Results  Component Value Date   BNP 42.0 05/23/2017   TROPONINI <0.03 05/23/2017   HGB 14.7 05/23/2017   HCT 43.0 05/23/2017                         CA Markers No results found for: CEA, CA125, LABCA2                      Note: Lab results reviewed.  Recent Diagnostic Imaging Results  DG Chest Port 1 View CLINICAL DATA:  c/o congestion, weakness since Saturday.  EXAM: PORTABLE CHEST 1 VIEW  COMPARISON:  None.  FINDINGS: Borderline cardiomegaly. Lungs are clear. No pleural effusion or pneumothorax seen. No acute or suspicious osseous finding.  IMPRESSION: No active disease. No evidence of pneumonia or pulmonary edema. Borderline cardiomegaly.  Electronically Signed   By: Franki Cabot M.D.   On: 05/23/2017 23:52  Complexity Note: Imaging results reviewed. Results shared with Laura [name], using Layman's terms.                          Meds   Current Outpatient Medications:  .  aspirin EC 81 MG tablet, Take by mouth., Disp: , Rfl:  .  benzonatate (TESSALON PERLES) 100 MG capsule, Take 1 capsule (100 mg total) by mouth every 6 (six) hours as needed for cough., Disp: 30 capsule, Rfl: 0 .  chlorthalidone (HYGROTON) 25 MG tablet, TAKE 1/2 TABLET (12.'5MG'$ ) BY MOUTH EVERY DAY, Disp: , Rfl: 11 .  Cholecalciferol (VITAMIN D) 2000 UNITS tablet, Take by mouth daily. , Disp: , Rfl:  .  docusate sodium (COLACE) 100 MG capsule, Take 100 mg by mouth 2 (two) times daily., Disp: , Rfl:  .  DULoxetine (CYMBALTA) 30 MG capsule, Two tablets in the morning, and one in the evening., Disp: 270 capsule, Rfl: 0 .  esomeprazole (NEXIUM) 20 MG capsule, Take by mouth daily. , Disp: , Rfl:  .  Fluticasone-Umeclidin-Vilant (TRELEGY ELLIPTA) 100-62.5-25 MCG/INH AEPB, Inhale 1 puff into the lungs daily., Disp: 1 each, Rfl: 4 .  folic acid (FOLVITE) 1 MG tablet, Take 1 mg by mouth daily., Disp: ,  Rfl:  .  furosemide (LASIX) 40 MG tablet, TAKE 1 TABLET ONCE DAILY, Disp: , Rfl:  .  hydroxychloroquine (PLAQUENIL) 200 MG tablet, Take 200 mg by mouth daily. , Disp: , Rfl:  .  inFLIXimab (REMICADE) 100 MG injection, Inject 100 mg into the vein every 8 (eight) weeks., Disp: , Rfl:  .  levothyroxine (SYNTHROID, LEVOTHROID) 200 MCG tablet, TAKE 1 TABLET ONCE DAILY- ON AN EMPTY STOMACH WITH A GLASS OF WATER 30-60 MINUTES BEFORE BREAKFAST, Disp: , Rfl: 5 .  loratadine (CLARITIN) 10 MG tablet, Take by mouth daily as needed. , Disp: , Rfl:  .  methotrexate (RHEUMATREX) 2.5 MG tablet, TAKE 4 TABLETS (10 MG TOTAL) BY MOUTH EVERY 7 (SEVEN) DAYS WITH A MEAL, Disp: , Rfl: 5 .  metoprolol succinate (TOPROL-XL) 50 MG 24 hr tablet, TAKE 1 TABLET (50 MG TOTAL) BY MOUTH ONCE DAILY., Disp: , Rfl: 1 .  Multiple Vitamin (MULTI-VITAMINS) TABS, Take by mouth., Disp: , Rfl:  .  QUEtiapine (SEROQUEL) 200 MG tablet, Take 1 tablet (200 mg total) by mouth at bedtime., Disp: 90  tablet, Rfl: 0 .  QUEtiapine (SEROQUEL) 50 MG tablet, Take 1 tablet (50 mg total) by mouth 2 (two) times daily., Disp: 180 tablet, Rfl: 0 .  simvastatin (ZOCOR) 10 MG tablet, Take 10 mg by mouth daily., Disp: , Rfl:  .  Spacer/Aero Chamber Mouthpiece MISC, 1 Units by Does not apply route every 4 (four) hours as needed (wheezing)., Disp: 1 each, Rfl: 0 .  spironolactone (ALDACTONE) 25 MG tablet, TAKE 1/2 (ONE-HALF) TABLET BY MOUTH DAILY, Disp: , Rfl: 11 .  [START ON 12/17/2017] oxyCODONE (OXY IR/ROXICODONE) 5 MG immediate release tablet, Take 1 tablet (5 mg total) by mouth every 6 (six) hours as needed for severe pain., Disp: 120 tablet, Rfl: 0 .  [START ON 11/17/2017] oxyCODONE (OXY IR/ROXICODONE) 5 MG immediate release tablet, Take 1 tablet (5 mg total) by mouth every 6 (six) hours as needed for severe pain., Disp: 120 tablet, Rfl: 0 .  oxyCODONE (OXY IR/ROXICODONE) 5 MG immediate release tablet, Take 1 tablet (5 mg total) by mouth every 6 (six) hours as needed for severe pain., Disp: 120 tablet, Rfl: 0  ROS  Constitutional: Denies any fever or chills Gastrointestinal: No reported hemesis, hematochezia, vomiting, or acute GI distress Musculoskeletal: Denies any acute onset joint swelling, redness, loss of ROM, or weakness Neurological: No reported episodes of acute onset apraxia, aphasia, dysarthria, agnosia, amnesia, paralysis, loss of coordination, or loss of consciousness  Allergies  Laura [name] is allergic to bupropion and penicillins.  PFSH  Drug: Laura [name]  reports that she does not use drugs. Alcohol:  reports that she does not drink alcohol. Tobacco:  reports that she has never smoked. She has never used smokeless tobacco. Medical:  has a past medical history of Allergy, Anxiety, Arthritis, degenerative (10/08/2013), Chronic kidney disease, Depression, Lumbar spinal stenosis with neurogenic claudication (11/07/2014), Major depression, single episode, in complete remission ([place])  (06/25/2015), Memory loss, short term (03/19/2014), Seizure ([place]) (10/07/2014), Sleep apnea, and Thyroid disease. Surgical: Laura [name]  has a past surgical history that includes Abdominal hysterectomy; Cesarean section; Replacement total knee (Left); and Hip surgery (Right). Family: family history includes Alcohol abuse in her father; Depression in her father and sister; Heart attack in her father; Hypertension in her mother and sister; Post-traumatic stress disorder in her father; Rheum arthritis in her sister; Stroke in her mother.  Constitutional Exam  General appearance: Well nourished, well developed, and well  hydrated. In no apparent acute distress Vitals:   10/18/17 1402  BP: (!) 93/52  Pulse: 92  Resp: 16  Temp: 98 F (36.7 C)  TempSrc: Oral  SpO2: 100%  Weight: 267 lb (121.1 kg)  Height: '5\' 5"'$  (1.651 m)  Psych/Mental status: Alert, oriented x 3 (person, place, & time)       Eyes: PERLA Respiratory: No evidence of acute respiratory distress  Lumbar Spine Area Exam  Skin & Axial Inspection: No masses, redness, or swelling Alignment: Symmetrical Functional ROM: Unrestricted ROM       Stability: No instability detected Muscle Tone/Strength: Functionally intact. No obvious neuro-muscular anomalies detected. Sensory (Neurological): Unimpaired Palpation: No palpable anomalies         Gait & Posture Assessment  Ambulation: Patient ambulates using a walker Gait: Relatively normal for age and body habitus Posture: WNL   Lower Extremity Exam    Side: Right lower extremity  Side: Left lower extremity  Stability: No instability observed          Stability: No instability observed          Skin & Extremity Inspection: Skin color, temperature, and hair growth are WNL. No peripheral edema or cyanosis. No masses, redness, swelling, asymmetry, or associated skin lesions. No contractures.  Skin & Extremity Inspection: Skin color, temperature, and hair growth are WNL. No peripheral edema or  cyanosis. No masses, redness, swelling, asymmetry, or associated skin lesions. No contractures.  Functional ROM: Unrestricted ROM                  Functional ROM: Unrestricted ROM                  Muscle Tone/Strength: Functionally intact. No obvious neuro-muscular anomalies detected.  Muscle Tone/Strength: Functionally intact. No obvious neuro-muscular anomalies detected.  Sensory (Neurological): Unimpaired  Sensory (Neurological): Unimpaired  Palpation: No palpable anomalies  Palpation: No palpable anomalies   Assessment  Primary Diagnosis & Pertinent Problem List: The primary encounter diagnosis was Lumbar spondylosis. Diagnoses of Chronic hip pain (Left), Chronic knee pain (Right), Chronic pain syndrome, and Long term prescription opiate use were also pertinent to this visit.  Status Diagnosis  Controlled Controlled Controlled 1. Lumbar spondylosis   2. Chronic hip pain (Left)   3. Chronic knee pain (Right)   4. Chronic pain syndrome   5. Long term prescription opiate use     Problems updated and reviewed during this visit: Problem  Body Mass Index (Bmi) of 40.0-44.9 in Adult ([place])  Hypothyroidism   Plan of Care  Pharmacotherapy (Medications Ordered): Meds ordered this encounter  Medications  . oxyCODONE (OXY IR/ROXICODONE) 5 MG immediate release tablet    Sig: Take 1 tablet (5 mg total) by mouth every 6 (six) hours as needed for severe pain.    Dispense:  120 tablet    Refill:  0    Do not add this medication to the electronic "Automatic Refill" notification system. Patient may have prescription filled one day early if pharmacy is closed on scheduled refill date.    Order Specific Question:   Supervising Provider    Answer:   Laura [name] [phone]  . oxyCODONE (OXY IR/ROXICODONE) 5 MG immediate release tablet    Sig: Take 1 tablet (5 mg total) by mouth every 6 (six) hours as needed for severe pain.    Dispense:  120 tablet    Refill:  0    Do not add this  medication to the electronic "Automatic Refill"  notification system. Patient may have prescription filled one day early if pharmacy is closed on scheduled refill date.    Order Specific Question:   Supervising Provider    Answer:   Laura [name] [phone]  . oxyCODONE (OXY IR/ROXICODONE) 5 MG immediate release tablet    Sig: Take 1 tablet (5 mg total) by mouth every 6 (six) hours as needed for severe pain.    Dispense:  120 tablet    Refill:  0    Do not add this medication to the electronic "Automatic Refill" notification system. Patient may have prescription filled one day early if pharmacy is closed on scheduled refill date.    Order Specific Question:   Supervising Provider    Answer:   Laura [name] [[id number]]   New Prescriptions   No medications on file   Medications administered today: Laura [name] had no medications administered during this visit. Lab-work, procedure(s), and/or referral(s): Orders Placed This Encounter  Procedures  . ToxASSURE Select 13 (MW), Urine   Imaging and/or referral(s): None  Interventional therapies: Planned, scheduled, and/or pending:  Not at this time.   Considering:  Therapeutic series of 5 Hyalgan right knee injections  Diagnostic intra-articular left hip joint injection  Possible left hip radiofrequency ablation  Diagnostic right intra-articular knee joint injection with local anesthetic and steroid  Diagnostic bilateral genicular nerve block  Possible bilateral genicular nerve radiofrequency ablation  Diagnostic bilateral lumbar facet block  Possible bilateral lumbar facet radiofrequency ablation  Diagnostic bilateral L3-4 and/or L5-S1 transforaminal epidural steroid injections  Diagnostic L3-4 versus L4-5 lumbar epidural steroid injection    Palliative PRN treatment(s):  Therapeutic series of 5 Hyalgan right knee injections Diagnostic intra-articular left hip joint injection  Diagnostic right intra-articular knee  joint injection with local anesthetic and steroid  Diagnostic bilateral genicular nerve block  Diagnostic bilateral lumbar facet block  Diagnostic bilateral L3-4 and/or L5-S1 transforaminal epidural steroid injections  Diagnostic L3-4 versus L4-5 lumbar epidural steroid injection     Provider-requested follow-up: Return in about 3 months (around 01/18/2018) for MedMgmt.  Future Appointments  Date Time Provider [place]  11/02/2017  2:00 PM Elvin So, MD [facility] None  12/21/2017 11:45 AM Allyne Gee, MD [facility] None  01/17/2018  1:30 PM Vevelyn Francois, NP [facility] None   Primary Care Physician: Glendon Axe, MD Location: [facility] Out[facility] Management [facility] Note by: Vevelyn Francois NP Date: 10/18/2017; Time: 5:07 PM  Pain Score Disclaimer: We use the NRS-11 scale. This is a self-reported, subjective measurement of pain severity with only modest accuracy. It is used primarily to identify changes within a particular patient. It must be understood that outpatient pain scales are significantly less accurate that those used for research, where they can be applied under ideal controlled circumstances with minimal exposure to variables. In reality, the score is likely to be a combination of pain intensity and pain affect, where pain affect describes the degree of emotional arousal or changes in action readiness caused by the sensory experience of pain. Factors such as social and work situation, setting, emotional state, anxiety levels, expectation, and prior pain experience may influence pain perception and show large inter-individual differences that may also be affected by time variables.  Patient instructions provided during this appointment: Patient Instructions   ____________________________________________________________________________________________  Medication Rules  Applies to: All patients receiving prescriptions (written or electronic).  Pharmacy of  record: Pharmacy where electronic prescriptions will be sent. If written prescriptions are taken to a different pharmacy, please inform  the nursing staff. The pharmacy listed in the electronic medical record should be the one where you would like electronic prescriptions to be sent.  Prescription refills: Only during scheduled appointments. Applies to both, written and electronic prescriptions.  NOTE: The following applies primarily to controlled substances (Opioid* Pain Medications).   Patient's responsibilities: 1. Pain Pills: Bring all pain pills to every appointment (except for procedure appointments). 2. Pill Bottles: Bring pills in original pharmacy bottle. Always bring newest bottle. Bring bottle, even if empty. 3. Medication refills: You are responsible for knowing and keeping track of what medications you need refilled. The day before your appointment, write a list of all prescriptions that need to be refilled. Bring that list to your appointment and give it to the admitting nurse. Prescriptions will be written only during appointments. If you forget a medication, it will not be "Called in", "Faxed", or "electronically sent". You will need to get another appointment to get these prescribed. 4. Prescription Accuracy: You are responsible for carefully inspecting your prescriptions before leaving our office. Have the discharge nurse carefully go over each prescription with you, before taking them home. Make sure that your name is accurately spelled, that your address is correct. Check the name and dose of your medication to make sure it is accurate. Check the number of pills, and the written instructions to make sure they are clear and accurate. Make sure that you are given enough medication to last until your next medication refill appointment. 5. Taking Medication: Take medication as prescribed. Never take more pills than instructed. Never take medication more frequently than prescribed. Taking  less pills or less frequently is permitted and encouraged, when it comes to controlled substances (written prescriptions).  6. Inform other Doctors: Always inform, all of your healthcare providers, of all the medications you take. 7. Pain Medication from other Providers: You are not allowed to accept any additional pain medication from any other Doctor or Healthcare provider. There are two exceptions to this rule. (see below) In the event that you require additional pain medication, you are responsible for notifying us, as stated below. 8. Medication Agreement: You are responsible for carefully reading and following our Medication Agreement. This must be signed before receiving any prescriptions from our practice. Safely store a copy of your signed Agreement. Violations to the Agreement will result in no further prescriptions. (Additional copies of our Medication Agreement are available upon request.) 9. Laws, Rules, & Regulations: All patients are expected to follow all Federal and [employer], [employer], Rules, [employer] [facility]. Ignorance of the Laws does not constitute a valid excuse. The use of any illegal substances is prohibited. 10. Adopted CDC guidelines & recommendations: Target dosing levels will be at or below 60 MME/day. Use of benzodiazepines** is not recommended.  Exceptions: There are only two exceptions to the rule of not receiving pain medications from other Healthcare Providers. 1. Exception #1 (Emergencies): In the event of an emergency (i.e.: accident requiring emergency care), you are allowed to receive additional pain medication. However, you are responsible for: As soon as you are able, call our office [phone], at any time of the day or night, and leave a message stating your name, the date and nature of the emergency, and the name and dose of the medication prescribed. In the event that your call is answered by a member of our staff, make sure to document and save the date, time,  and the name of the person that took your information.  2.  Exception #2 (Planned Surgery): In the event that you are scheduled by another doctor or dentist to have any type of surgery or procedure, you are allowed (for a period no longer than 30 days), to receive additional pain medication, for the acute post-op pain. However, in this case, you are responsible for picking up a copy of our "Post-op Pain Management for Surgeons" handout, and giving it to your surgeon or dentist. This document is available at our office, and does not require an appointment to obtain it. Simply go to our office during business hours (Monday-Thursday from 8:00 AM to 4:00 PM) (Friday 8:00 AM to 12:00 Noon) or if you have a scheduled appointment with [place], prior to your surgery, and ask for it by name. In addition, you will need to provide [place] with your name, name of your surgeon, type of surgery, and date of procedure or surgery.  *Opioid medications include: morphine, codeine, oxycodone, oxymorphone, hydrocodone, hydromorphone, meperidine, tramadol, tapentadol, buprenorphine, fentanyl, methadone. **Benzodiazepine medications include: diazepam (Valium), alprazolam (Xanax), clonazepam (Klonopine), lorazepam (Ativan), clorazepate (Tranxene), chlordiazepoxide (Librium), estazolam (Prosom), oxazepam (Serax), temazepam (Restoril), triazolam (Halcion) (Last updated: 03/09/2017) ____________________________________________________________________________________________   BMI Assessment: Estimated body mass index is 44.43 kg/m as calculated from the following:   Height as of this encounter: _0  (1.651 m).   Weight as of this encounter: 267 lb (121.1 kg).  BMI interpretation table: BMI level Category Range association with higher incidence of chronic pain  <18 kg/m2 Underweight   18.5-24.9 kg/m2 Ideal body weight   25-29.9 kg/m2 Overweight Increased incidence by 20%  30-34.9 kg/m2 Obese (Class I) Increased incidence by 68%   35-39.9 kg/m2 Severe obesity (Class II) Increased incidence by 136%  >40 kg/m2 Extreme obesity (Class III) Increased incidence by 254%   Patient's current BMI Ideal Body weight  Body mass index is 44.43 kg/m. Ideal body weight: 57 kg (125 lb 10.6 oz) Adjusted ideal body weight: 82.6 kg (182 lb 3.2 oz)   BMI Readings from Last 4 Encounters:  10/18/17 44.43 kg/m  07/10/17 44.93 kg/m  06/19/17 44.93 kg/m  05/29/17 44.93 kg/m   Wt Readings from Last 4 Encounters:  10/18/17 267 lb (121.1 kg)  07/10/17 270 lb (122.5 kg)  06/19/17 270 lb (122.5 kg)  05/29/17 270 lb (122.5 kg)   Oxycodone 5 mg x 3 months to begin filling today 10/18/17 escribed for patient.

## 2017-10-18 NOTE — Progress Notes (Signed)
Nursing Pain Medication Assessment:  Safety precautions to be maintained throughout the outpatient stay will include: orient to surroundings, keep bed in low position, maintain call bell within reach at all times, provide assistance with transfer out of bed and ambulation.  Medication Inspection Compliance: Ms. Gibeault did not comply with our request to bring her pills to be counted. She was reminded that bringing the medication bottles, even when empty, is a requirement.  Medication: None brought in. Pill/Patch Count: None available to be counted. Bottle Appearance: No container available. Did not bring bottle(s) to appointment. Filled Date: N/A Last Medication intake:  Yesterday    Patient reports that she thinks she has 1 or 2 pills left.

## 2017-10-22 LAB — TOXASSURE SELECT 13 (MW), URINE

## 2017-10-23 ENCOUNTER — Telehealth: Payer: Self-pay

## 2017-10-23 NOTE — Telephone Encounter (Signed)
received fax from pharmacy pt needs refills on quetiapine 50mg  and 200mg  pt was last seen on  08-03-17 next appt  11-02-17

## 2017-10-23 NOTE — Telephone Encounter (Signed)
pt called states she needs a refill on her medication or enough medication to get to her next appt.

## 2017-10-24 NOTE — Telephone Encounter (Signed)
Next appt  11-02-17

## 2017-10-24 NOTE — Telephone Encounter (Signed)
pt called states she has only 3 pills left states that she dropped them and they went everywhere.

## 2017-10-24 NOTE — Telephone Encounter (Signed)
rx was called in seroquel 200mg  was #11 with no refills and then the 50mg  was #22 with no refills.   Pt was called and notified that enough medications were called in to due until her next appt.

## 2017-11-02 ENCOUNTER — Ambulatory Visit (INDEPENDENT_AMBULATORY_CARE_PROVIDER_SITE_OTHER): Payer: Medicare Other | Admitting: Psychiatry

## 2017-11-02 ENCOUNTER — Encounter: Payer: Self-pay | Admitting: Psychiatry

## 2017-11-02 ENCOUNTER — Other Ambulatory Visit: Payer: Self-pay

## 2017-11-02 VITALS — BP 142/86 | Wt 266.0 lb

## 2017-11-02 DIAGNOSIS — F5101 Primary insomnia: Secondary | ICD-10-CM | POA: Diagnosis not present

## 2017-11-02 DIAGNOSIS — F3340 Major depressive disorder, recurrent, in remission, unspecified: Secondary | ICD-10-CM

## 2017-11-02 MED ORDER — QUETIAPINE FUMARATE 50 MG PO TABS: 50.0000 mg | ORAL_TABLET | Freq: Two times a day (BID) | ORAL | 0 refills | Status: DC

## 2017-11-02 MED ORDER — QUETIAPINE FUMARATE 200 MG PO TABS: 200.0000 mg | ORAL_TABLET | Freq: Every day | ORAL | 0 refills | Status: DC

## 2017-11-02 MED ORDER — DULOXETINE HCL 30 MG PO CPEP: ORAL_CAPSULE | ORAL | 0 refills | Status: DC

## 2017-11-02 NOTE — Progress Notes (Signed)
Patient ID: Laura [name], female   DOB: [birth year], 72 y.o.   MRN: [id number]  [facility] MD/PA/NP OP Progress Note  11/02/2017 2:03 PM Chaela Branscum  MRN:  [id number]  Subjective:  Patient is a 72 year old Caucasian female with a long history of depression and insomnia. Reports she is happy, waiting to get her halloween costume. Fair sleep and appetite. Enjoying her adult children.  States that mood wise she is doing well.  Denies any suicidal thoughts.   Visit Diagnosis:     ICD-10-CM   1. Recurrent major depressive disorder, in remission (HCC) F33.40   2. 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 Allergy   . Anxiety   . Arthritis, degenerative 10/08/2013   Overview:    a.  Lumbar spine/spinal stenosis/foot drop.   b.  Hands.   . Chronic kidney disease   . Depression   . 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 Major depression, single episode, in complete remission (HCC) 06/25/2015  . Memory loss, short term 03/19/2014  . Seizure (HCC) 10/07/2014  . Sleep apnea   . Thyroid disease     Past Surgical History:  Procedure Laterality Date  . ABDOMINAL HYSTERECTOMY    . CESAREAN SECTION    . HIP SURGERY Right   . REPLACEMENT TOTAL KNEE Left    Family History:  Family History  Problem Relation Age of Onset  . Hypertension Mother   . Stroke Mother   . Heart attack Father   . Alcohol abuse Father   . Depression Father   . Post-traumatic stress disorder Father   . Rheum arthritis Sister   . Hypertension Sister   . Depression Sister    Social History:  Social History   Socioeconomic History  . Marital status: Married    Spouse name: Not on file  . Number of children: Not on file  . Years of education: Not on file  . Highest education level: Not on file  Occupational History    Comment: retired  Engineer, production  . Financial resource strain: Not hard at all  . Food insecurity:    Worry: Never  true    Inability: Never true  . Transportation needs:    Medical: No    Non-medical: No  Tobacco Use  . Smoking status: Never Smoker  . Smokeless tobacco: Never Used  Substance and Sexual Activity  . Alcohol use: No    Alcohol/week: 0.0 standard drinks  . Drug use: No  . Sexual activity: Not Currently  Lifestyle  . Physical activity:    Days per week: 0 days    Minutes per session: 0 min  . Stress: Not at all  Relationships  . Social connections:    Talks on phone: More than three times a week    Gets together: Once a week    Attends religious service: Never    Active member of club or organization: No    Attends meetings of clubs or organizations: Never    Relationship status: Married  Other Topics Concern  . Not on file  Social History Narrative  . Not on file   Additional History:   Assessment:   Musculoskeletal: Strength & Muscle Tone: within normal limits Gait & Station: normal Patient leans: N/A  Psychiatric Specialty Exam: Medication Refill     Review of Systems  Psychiatric/Behavioral: Negative for depression, hallucinations, memory loss, substance abuse and suicidal ideas. The patient  is not nervous/anxious and does not have insomnia (Avis complaints have improved and she attributes it to  Seroquel).   All other systems reviewed and are negative.    Blood pressure (!) 142/86, weight 266 lb (120.7 kg).Body mass index is 44.26 kg/m.  General Appearance: Well Groomed  Eye Contact:  Good  Speech:  Normal Rate  Volume:  Normal  Mood:  Good  Affect:  Congruent  Thought Process:  Linear and Logical  Orientation:  Full (Time, Place, and Person)  Thought Content:  Negative  Suicidal Thoughts:  No  Homicidal Thoughts:  No  Memory:  Immediate;   Good Recent;   Good Remote;   Good  Judgement:  Good  Insight:  Good  Psychomotor Activity:  Negative  Concentration:  Good  Recall:  Good  Fund of Knowledge: Good  Language: Good  Akathisia:  Negative   Handed:    AIMS (if indicated):  N/A  Assets:  Communication Skills Desire for Improvement Social Support  ADL's:  Intact  Cognition: WNL  Sleep:  fair   Is the patient at risk to self?  No. Has the patient been a risk to self in the past 6 months?  No. Has the patient been a risk to self within the distant past?  No. Is the patient a risk to others?  No. Has the patient been a risk to others in the past 6 months?  No. Has the patient been a risk to others within the distant past?  No.  Current Medications: Current Outpatient Medications  Medication Sig Dispense Refill  . simvastatin (ZOCOR) 10 MG tablet Take 10 mg by mouth daily.    Marland Kitchen aspirin EC 81 MG tablet Take by mouth.    . benzonatate (TESSALON PERLES) 100 MG capsule Take 1 capsule (100 mg total) by mouth every 6 (six) hours as needed for cough. 30 capsule 0  . chlorthalidone (HYGROTON) 25 MG tablet TAKE 1/2 TABLET (12.5MG ) BY MOUTH EVERY DAY  11  . Cholecalciferol (VITAMIN D) 2000 UNITS tablet Take by mouth daily.     Marland Kitchen docusate sodium (COLACE) 100 MG capsule Take 100 mg by mouth 2 (two) times daily.    . DULoxetine (CYMBALTA) 30 MG capsule Two tablets in the morning, and one in the evening. 270 capsule 0  . esomeprazole (NEXIUM) 20 MG capsule Take by mouth daily.     . Fluticasone-Umeclidin-Vilant (TRELEGY ELLIPTA) 100-62.5-25 MCG/INH AEPB Inhale 1 puff into the lungs daily. 1 each 4  . folic acid (FOLVITE) 1 MG tablet Take 1 mg by mouth daily.    . furosemide (LASIX) 40 MG tablet TAKE 1 TABLET ONCE DAILY    . hydroxychloroquine (PLAQUENIL) 200 MG tablet Take 200 mg by mouth daily.     Marland Kitchen inFLIXimab (REMICADE) 100 MG injection Inject 100 mg into the vein every 8 (eight) weeks.    Marland Kitchen levothyroxine (SYNTHROID, LEVOTHROID) 200 MCG tablet TAKE 1 TABLET ONCE DAILY- ON AN EMPTY STOMACH WITH A GLASS OF WATER 30-60 MINUTES BEFORE BREAKFAST  5  . loratadine (CLARITIN) 10 MG tablet Take by mouth daily as needed.     . methotrexate  (RHEUMATREX) 2.5 MG tablet TAKE 4 TABLETS (10 MG TOTAL) BY MOUTH EVERY 7 (SEVEN) DAYS WITH A MEAL  5  . metoprolol succinate (TOPROL-XL) 50 MG 24 hr tablet TAKE 1 TABLET (50 MG TOTAL) BY MOUTH ONCE DAILY.  1  . Multiple Vitamin (MULTI-VITAMINS) TABS Take by mouth.    [name] ON 12/17/2017]  oxyCODONE (OXY IR/ROXICODONE) 5 MG immediate release tablet Take 1 tablet (5 mg total) by mouth every 6 (six) hours as needed for severe pain. 120 tablet 0  . [START ON 11/17/2017] oxyCODONE (OXY IR/ROXICODONE) 5 MG immediate release tablet Take 1 tablet (5 mg total) by mouth every 6 (six) hours as needed for severe pain. 120 tablet 0  . oxyCODONE (OXY IR/ROXICODONE) 5 MG immediate release tablet Take 1 tablet (5 mg total) by mouth every 6 (six) hours as needed for severe pain. 120 tablet 0  . QUEtiapine (SEROQUEL) 200 MG tablet Take 1 tablet (200 mg total) by mouth at bedtime. 90 tablet 0  . QUEtiapine (SEROQUEL) 50 MG tablet Take 1 tablet (50 mg total) by mouth 2 (two) times daily. 180 tablet 0  . Spacer/Aero Chamber Mouthpiece MISC 1 Units by Does not apply route every 4 (four) hours as needed (wheezing). 1 each 0  . spironolactone (ALDACTONE) 25 MG tablet TAKE 1/2 (ONE-HALF) TABLET BY MOUTH DAILY  11   No current facility-administered medications for this visit.     Medical Decision Making:  Established Problem, Stable/Improving (1), Review of Medication Regimen & Side Effects (2) and Review of New Medication or Change in Dosage (2)  Treatment Plan Summary:Medication management and Plan    Major depressive disorder, recurrent, moderate-patient will continue her Cymbalta 60 mg in the morning and 30 mg in the evening.   Insomnia- Continue Seroquel at 200 mg at bedtime.   Anxiety- take Seroquel at 50 mg twice daily. If she gets too sedated in the morning to shift change that to bedtime.  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  RTC to clinic in 3 months time or call before if  necessary. Patient will follow up with Dr.Eappen since this provider will be out.  Ellarie Picking 11/02/2017, 2:03 PM

## 2017-12-04 ENCOUNTER — Encounter: Payer: Self-pay | Admitting: Psychiatry

## 2017-12-06 NOTE — Progress Notes (Signed)
Previous warned about Tramadol admitted that sister was using this but not her. PMP negative for Tramadol

## 2017-12-21 ENCOUNTER — Ambulatory Visit (INDEPENDENT_AMBULATORY_CARE_PROVIDER_SITE_OTHER): Payer: Medicare Other | Admitting: Internal Medicine

## 2017-12-21 ENCOUNTER — Encounter: Payer: Self-pay | Admitting: Internal Medicine

## 2017-12-21 VITALS — BP 128/87 | HR 74 | Resp 16 | Ht 65.0 in | Wt 270.0 lb

## 2017-12-21 DIAGNOSIS — G4733 Obstructive sleep apnea (adult) (pediatric): Secondary | ICD-10-CM | POA: Diagnosis not present

## 2017-12-21 DIAGNOSIS — N183 Chronic kidney disease, stage 3 unspecified: Secondary | ICD-10-CM

## 2017-12-21 DIAGNOSIS — Z6841 Body Mass Index (BMI) 40.0 and over, adult: Secondary | ICD-10-CM

## 2017-12-21 DIAGNOSIS — K219 Gastro-esophageal reflux disease without esophagitis: Secondary | ICD-10-CM

## 2017-12-21 NOTE — Patient Instructions (Signed)

## 2017-12-21 NOTE — Progress Notes (Signed)
[facility] [address] [place], [place] [address]  Pulmonary Sleep Medicine   Office Visit Note  Patient Name: Laura [name] DOB: 17-Jan-1945 MRN [id number]  Date of Service: 12/21/2017  Complaints/HPI: She is doing relatively well.  She still has significant weakness and uses a walker to get around.  Patient states that she is using her CPAP device along with her husband.  She denies having any complications related to the CPAP device.  Denies having any cough no congestion.  Denies having any chest pain.  ROS  General: (-) fever, (-) chills, (-) night sweats, (-) weakness Skin: (-) rashes, (-) itching,. Eyes: (-) visual changes, (-) redness, (-) itching. Nose and Sinuses: (-) nasal stuffiness or itchiness, (-) postnasal drip, (-) nosebleeds, (-) sinus trouble. Mouth and Throat: (-) sore throat, (-) hoarseness. Neck: (-) swollen glands, (-) enlarged thyroid, (-) neck pain. Respiratory: - cough, (-) bloody sputum, + shortness of breath, - wheezing. Cardiovascular: - ankle swelling, (-) chest pain. Lymphatic: (-) lymph node enlargement. Neurologic: (-) numbness, (-) tingling. Psychiatric: (-) anxiety, (-) depression   Current Medication: Outpatient Encounter Medications as of 12/21/2017  Medication Sig Note  . aspirin EC 81 MG tablet Take by mouth.   . chlorthalidone (HYGROTON) 25 MG tablet TAKE 1/2 TABLET (12.5MG ) BY MOUTH EVERY DAY   . Cholecalciferol (VITAMIN D) 2000 UNITS tablet Take by mouth daily.    Marland Kitchen docusate sodium (COLACE) 100 MG capsule Take 100 mg by mouth 2 (two) times daily.   . DULoxetine (CYMBALTA) 30 MG capsule Two tablets in the morning, and one in the evening.   Marland Kitchen esomeprazole (NEXIUM) 20 MG capsule Take by mouth daily.    . folic acid (FOLVITE) 1 MG tablet Take 1 mg by mouth daily.   . furosemide (LASIX) 40 MG tablet TAKE 1 TABLET ONCE DAILY   . hydroxychloroquine (PLAQUENIL) 200 MG tablet Take 200 mg by mouth daily.    Marland Kitchen inFLIXimab  (REMICADE) 100 MG injection Inject 100 mg into the vein every 8 (eight) weeks.   Marland Kitchen levothyroxine (SYNTHROID, LEVOTHROID) 200 MCG tablet TAKE 1 TABLET ONCE DAILY- ON AN EMPTY STOMACH WITH A GLASS OF WATER 30-60 MINUTES BEFORE BREAKFAST   . loratadine (CLARITIN) 10 MG tablet Take by mouth daily as needed.    . methotrexate (RHEUMATREX) 2.5 MG tablet TAKE 4 TABLETS (10 MG TOTAL) BY MOUTH EVERY 7 (SEVEN) DAYS WITH A MEAL   . metoprolol succinate (TOPROL-XL) 50 MG 24 hr tablet TAKE 1 TABLET (50 MG TOTAL) BY MOUTH ONCE DAILY.   . Multiple Vitamin (MULTI-VITAMINS) TABS Take by mouth.   . oxyCODONE (OXY IR/ROXICODONE) 5 MG immediate release tablet Take 1 tablet (5 mg total) by mouth every 6 (six) hours as needed for severe pain.   Marland Kitchen QUEtiapine (SEROQUEL) 200 MG tablet Take 1 tablet (200 mg total) by mouth at bedtime.   Marland Kitchen QUEtiapine (SEROQUEL) 50 MG tablet Take 1 tablet (50 mg total) by mouth 2 (two) times daily.   . simvastatin (ZOCOR) 10 MG tablet Take 10 mg by mouth daily.   Marland Kitchen Spacer/Aero Chamber Mouthpiece MISC 1 Units by Does not apply route every 4 (four) hours as needed (wheezing).   Marland Kitchen spironolactone (ALDACTONE) 25 MG tablet TAKE 1/2 (ONE-HALF) TABLET BY MOUTH DAILY 10/18/2017: Ran out of this is going to pick up this RX  . Fluticasone-Umeclidin-Vilant (TRELEGY ELLIPTA) 100-62.5-25 MCG/INH AEPB Inhale into the lungs.   Marland Kitchen oxyCODONE (OXY IR/ROXICODONE) 5 MG immediate release tablet Take 1 tablet (  5 mg total) by mouth every 6 (six) hours as needed for severe pain.   Marland Kitchen oxyCODONE (OXY IR/ROXICODONE) 5 MG immediate release tablet Take 1 tablet (5 mg total) by mouth every 6 (six) hours as needed for severe pain.   . [DISCONTINUED] benzonatate (TESSALON PERLES) 100 MG capsule Take 1 capsule (100 mg total) by mouth every 6 (six) hours as needed for cough. (Patient not taking: Reported on 12/21/2017)   . [DISCONTINUED] Fluticasone-Umeclidin-Vilant (TRELEGY ELLIPTA) 100-62.5-25 MCG/INH AEPB Inhale 1 puff into the  lungs daily. (Patient not taking: Reported on 12/21/2017)    No facility-administered encounter medications on file as of 12/21/2017.     Surgical History: Past Surgical History:  Procedure Laterality Date  . ABDOMINAL HYSTERECTOMY    . CESAREAN SECTION    . HIP SURGERY Right   . REPLACEMENT TOTAL KNEE Left     Medical History: Past Medical History:  Diagnosis Date  . Allergy   . Anxiety   . Arthritis, degenerative 10/08/2013   Overview:    a.  Lumbar spine/spinal stenosis/foot drop.   b.  Hands.   . Chronic kidney disease   . Depression   . 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 Major depression, single episode, in complete remission (HCC) 06/25/2015  . Memory loss, short term 03/19/2014  . Seizure (HCC) 10/07/2014  . Sleep apnea   . Thyroid disease     Family History: Family History  Problem Relation Age of Onset  . Hypertension Mother   . Stroke Mother   . Heart attack Father   . Alcohol abuse Father   . Depression Father   . Post-traumatic stress disorder Father   . Rheum arthritis Sister   . Hypertension Sister   . Depression Sister     Social History: Social History   Socioeconomic History  . Marital status: Married    Spouse name: Not on file  . Number of children: Not on file  . Years of education: Not on file  . Highest education level: Not on file  Occupational History    Comment: retired  Engineer, production  . Financial resource strain: Not hard at all  . Food insecurity:    Worry: Never true    Inability: Never true  . Transportation needs:    Medical: No    Non-medical: No  Tobacco Use  . Smoking status: Never Smoker  . Smokeless tobacco: Never Used  Substance and Sexual Activity  . Alcohol use: No    Alcohol/week: 0.0 standard drinks  . Drug use: No  . Sexual activity: Not Currently  Lifestyle  . Physical activity:    Days per week: 0 days    Minutes per session: 0 min  . Stress: Not at all  Relationships  .  Social connections:    Talks on phone: More than three times a week    Gets together: Once a week    Attends religious service: Never    Active member of club or organization: No    Attends meetings of clubs or organizations: Never    Relationship status: Married  . Intimate partner violence:    Fear of current or ex partner: No    Emotionally abused: No    Physically abused: No    Forced sexual activity: No  Other Topics Concern  . Not on file  Social History Narrative  . Not on file    Vital Signs: Blood pressure 128/87, pulse 74, resp. rate 16, height  5\' 5"  (1.651 m), weight 270 lb (122.5 kg), SpO2 98 %.  Examination: General Appearance: The patient is well-developed, well-nourished, and in no distress. Skin: Gross inspection of skin unremarkable. Head: normocephalic, no gross deformities. Eyes: no gross deformities noted. ENT: ears appear grossly normal no exudates. Neck: Supple. No thyromegaly. No LAD. Respiratory: no rhonchi noted. Cardiovascular: Normal S1 and S2 without murmur or rub. Extremities: No cyanosis. pulses are equal. Neurologic: Alert and oriented. No involuntary movements.  LABS: Recent Results (from the past 2160 hour(s))  ToxASSURE Select 13 (MW), Urine     Status: None   Collection Time: 10/18/17  4:00 PM  Result Value Ref Range   Summary FINAL     Comment: ==================================================================== TOXASSURE SELECT 13 (MW) ==================================================================== Test                             Result       Flag       Units Drug Present not Declared for Prescription Verification   Tramadol                       >2370        UNEXPECTED ng/mg creat   O-Desmethyltramadol            1950         UNEXPECTED ng/mg creat   N-Desmethyltramadol            >2370        UNEXPECTED ng/mg creat    Source of tramadol is a prescription medication.    O-desmethyltramadol and N-desmethyltramadol are  expected    metabolites of tramadol. Drug Absent but Declared for Prescription Verification   Oxycodone                      Not Detected UNEXPECTED ng/mg creat ==================================================================== Test                      Result    Flag   Units      Ref Range   Creatinine              211              mg/dL      12/18/17 ======================================= ============================= Declared Medications:  The flagging and interpretation on this report are based on the  following declared medications.  Unexpected results may arise from  inaccuracies in the declared medications.  **Note: The testing scope of this panel includes these medications:  Oxycodone  **Note: The testing scope of this panel does not include following  reported medications:  Aspirin (Aspirin 81)  Benzonatate  Chlorthalidone  Docusate  Duloxetine  Esomeprazole  Fluticasone  Folic acid  Furosemide  Hydroxychloroquine  Infliximab  Levothyroxine  Loratadine  Methotrexate  Metoprolol  Quetiapine  Simvastatin  Spironolactone  Umeclidinium ==================================================================== For clinical consultation, please call [phone]. ====================================================================     Radiology: Dg Chest Port 1 View  Result Date: 05/23/2017 CLINICAL DATA:  c/o congestion, weakness since Saturday. EXAM: PORTABLE CHEST 1 VIEW COMPARISON:  None. FINDINGS: Borderline cardiomegaly. Lungs are clear. No pleural effusion or pneumothorax seen. No acute or suspicious osseous finding. IMPRESSION: No active disease. No evidence of pneumonia or pulmonary edema. Borderline cardiomegaly. Electronically Signed   By: Thursday M.D.   On: 05/23/2017 23:52    No results found.  No results found.  Assessment and Plan: Patient Active Problem List   Diagnosis Date Noted  . Body mass index (BMI) of 40.0-44.9 in adult (HCC)  10/09/2017  . Postmenopausal 04/06/2017  . Chronic arthralgias of knees and hips (Right) 08/18/2016  . Arthritis 07/11/2016  . Dyspnea on exertion 07/11/2016  . Kidney disease 07/11/2016  . 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 Snoring 07/11/2016  . Morbid obesity with BMI of 40.0-44.9, adult (HCC) 07/11/2016  . Opioid-induced constipation (OIC) 04/21/2016  . Osteoarthritis of knee (Right) 01/25/2016  . Diet-controlled diabetes mellitus (HCC) 10/03/2015  . GAD (generalized anxiety disorder) 09/24/2015  . Disturbance of skin sensation 07/15/2015  . Elevated sedimentation rate 07/15/2015  . Elevated C-reactive protein (CRP) 07/15/2015  . Chronic hip pain (Left) 04/06/2015  . History of TKR (total knee replacement) (Left) 02/09/2015  . History of femur fracture (Right) 02/09/2015  . 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 Lumbar foraminal stenosis (Bilateral L3-4 and L5-S1) 02/09/2015  . Chronic low back pain (Location of Primary Source of Pain) (Bilateral) (L>R) 01/22/2015  . Chronic pain syndrome 11/25/2014  . Opioid dependence (HCC) 11/07/2014  . Lumbar spinal stenosis (5 mm Severe L3-4; 8 mm L4-5) 11/07/2014  . Lumbar spondylosis 11/07/2014  . Lumbar facet syndrome (Location of Primary Source of Pain) (Bilateral) (L>R) 11/07/2014  . Chronic knee pain (Right) 11/05/2014  . Opiate use (30 MME/Day) 11/05/2014  . Long term current use of opiate analgesic 11/05/2014  . Long term prescription opiate use 11/05/2014  . Encounter for therapeutic drug level monitoring 11/05/2014  . Morbid obesity (HCC) 10/27/2014  . Extreme obesity (HCC) 10/07/2014  . Type 2 diabetes mellitus (HCC) 03/19/2014  . Depressive disorder 10/14/2013  . Essential (primary) hypertension 10/14/2013  . Insomnia secondary to chronic pain 07/09/2013  . Anxiety 07/09/2013  . GERD (gastroesophageal reflux disease) 01/18/2013  .  Hypothyroidism 01/18/2013  . Hypercholesterolemia 01/18/2013  . 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 CKD (chronic kidney disease) stage 3, GFR 30-59 ml/min (HCC) 01/17/2013  . Congestive heart failure with left ventricular systolic dysfunction (HCC) 11/10/2012    1. OSA patient needs to continue working on compliance with the CPAP she is doing well so far.  She states that she is sleeping better since she has been using the device. 2. GERD controlled at this time continue with current management. 3. CKD III she is being followed by her PCP monitor labs 4. Morbid obesity spoke to her at length about weight loss through diet and exercise management.  Patient states that she will try to work on this she is not been successful  General Counseling: I have discussed the findings of the evaluation and examination with Lanora Manis.  I have also discussed any further diagnostic evaluation thatmay be needed or ordered today. Efrata verbalizes understanding of the findings of todays visit. We also reviewed her medications today and discussed drug interactions and side effects including but not limited excessive drowsiness and altered mental states. We also discussed that there is always a risk not just to her but also people around her. she has been encouraged to call the office with any questions or concerns that should arise related to todays visit.    Time spent: 15 minutes  I have personally obtained a history, examined the patient, evaluated laboratory and imaging results, formulated the assessment and plan and placed orders.    Yevonne Pax, MD [facility] Pulmonary and Critical Care Sleep medicine

## 2017-12-25 ENCOUNTER — Encounter: Payer: Self-pay | Admitting: Podiatry

## 2017-12-25 ENCOUNTER — Ambulatory Visit (INDEPENDENT_AMBULATORY_CARE_PROVIDER_SITE_OTHER): Payer: Medicare Other | Admitting: Podiatry

## 2017-12-25 VITALS — BP 157/82 | HR 82

## 2017-12-25 DIAGNOSIS — S91209A Unspecified open wound of unspecified toe(s) with damage to nail, initial encounter: Secondary | ICD-10-CM | POA: Diagnosis not present

## 2017-12-25 DIAGNOSIS — E1142 Type 2 diabetes mellitus with diabetic polyneuropathy: Secondary | ICD-10-CM

## 2017-12-25 NOTE — Progress Notes (Signed)
This patient presents to the office stating that she has a red swollen painful big toenail left foot.  She says that approximately 2 weeks ago she dropped a piece of wood onto her toe nail.  She says that she has been trying to treat it with Epson salt soaks but minimal relief is noted.  She says that the toe is becoming increasingly red and painful as she walks.  This patient is diabetic type II.  She presents the office today for an evaluation and treatment of her painful big toe left foot.  Vascular  Dorsalis pedis and posterior tibial pulses are palpable  B/L.  Capillary return  WNL. Cold feet noted  B/L  Swelling.  Skin turgor  WNL  Sensorium  Senn Weinstein monofilament wire  Diminished . Diminished  tactile sensation.  Nail Exam  Patient has normal nails with no evidence of bacterial or fungal infection.  Patient has redness and swelling noted at the proximal nail fold left hallux toenail.  The nail plate has separated from the nailbed on the left hallux.  No evidence of any pus or drainage noted.  Orthopedic  Exam  Muscle tone and muscle strength  WNL.  No limitations of motion feet  B/L.  No crepitus or joint effusion noted.  Foot type is unremarkable and digits show no abnormalities.  Bony prominences are unremarkable.  Skin  No open lesions.  Normal skin texture and turgor.  Nail Dystrophy  Nail Injury left hallux nail.  IE discussed condition with this patient.  We decided to remove the nail plate from the nail bed without the use of anesthesia.  The nail was removed and dry sterile dressing was applied.  Home instructions were given.  Patient to return to the office as needed.   Helane Gunther DPM

## 2018-01-17 ENCOUNTER — Encounter: Payer: PRIVATE HEALTH INSURANCE | Admitting: Nurse Practitioner

## 2018-01-18 ENCOUNTER — Other Ambulatory Visit: Payer: Self-pay

## 2018-01-18 ENCOUNTER — Encounter: Payer: Self-pay | Admitting: Nurse Practitioner

## 2018-01-18 ENCOUNTER — Ambulatory Visit: Payer: Medicare Other | Attending: Nurse Practitioner | Admitting: Nurse Practitioner

## 2018-01-18 VITALS — BP 87/66 | HR 71 | Temp 97.9°F | Ht 65.0 in | Wt 264.0 lb

## 2018-01-18 DIAGNOSIS — M47816 Spondylosis without myelopathy or radiculopathy, lumbar region: Secondary | ICD-10-CM | POA: Insufficient documentation

## 2018-01-18 DIAGNOSIS — M0600 Rheumatoid arthritis without rheumatoid factor, unspecified site: Secondary | ICD-10-CM | POA: Insufficient documentation

## 2018-01-18 DIAGNOSIS — G894 Chronic pain syndrome: Secondary | ICD-10-CM | POA: Insufficient documentation

## 2018-01-18 DIAGNOSIS — M160 Bilateral primary osteoarthritis of hip: Secondary | ICD-10-CM

## 2018-01-18 DIAGNOSIS — M170 Bilateral primary osteoarthritis of knee: Secondary | ICD-10-CM | POA: Insufficient documentation

## 2018-01-18 MED ORDER — OXYCODONE HCL 5 MG PO TABS: 5.0000 mg | ORAL_TABLET | Freq: Four times a day (QID) | ORAL | 0 refills | Status: DC | PRN

## 2018-01-18 NOTE — Progress Notes (Signed)
[name]  MRN: [id number]  Referring Provider: Glendon Axe, MD  DOB: [birth year]  PCP: Glendon Axe, MD  DOS: 01/18/2018  Note by: Vevelyn Francois NP  Service setting: Ambulatory outpatient  Specialty: Interventional Pain Management  Location: [facility] (AMB) Pain Management Facility    Patient type: Established    Primary Reason(s) for Visit: Encounter for prescription drug management. (Level of risk: moderate)  CC: Back Pain  HPI  Laura [name] is a 73 y.o. year old, female patient, who comes today for a medication management evaluation. She has CKD (chronic kidney disease) stage 3, GFR 30-59 ml/min ([place]); Depressive disorder; Essential (primary) hypertension; GERD (gastroesophageal reflux disease); Hypothyroidism; Hypercholesterolemia; Extreme obesity ([place]); Insomnia secondary to chronic pain; Congestive heart failure with left ventricular systolic dysfunction ([place]); Type 2 diabetes mellitus ([place]); Morbid obesity ([place]); Seronegative rheumatoid arthritis ([place]); Chronic knee pain (Right); Opiate use (30 MME/Day); Long term current use of opiate analgesic; Long term prescription opiate use; Encounter for therapeutic drug level monitoring; Opioid dependence ([place]); Lumbar spinal stenosis (5 mm Severe L3-4; 8 mm L4-5); Lumbar spondylosis; Lumbar facet syndrome (Location of Primary Source of Pain) (Bilateral) (L>R); Chronic pain syndrome; Chronic low back pain (Location of Primary Source of Pain) (Bilateral) (L>R); History of TKR (total knee replacement) (Left); History of femur fracture (Right); 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); 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); Lumbar foraminal stenosis (Bilateral L3-4 and L5-S1); Chronic hip pain (Left); Disturbance of skin sensation; Elevated sedimentation rate; Elevated C-reactive protein (CRP); GAD (generalized anxiety disorder); Anxiety; Diet-controlled diabetes mellitus ([place]); Osteoarthritis of knee (Right); Opioid-induced  constipation ([place]); Arthritis; Dyspnea on exertion; Kidney disease; Primary fibromyalgia syndrome; Snoring; Morbid obesity with BMI of 40.0-44.9, adult ([place]); Chronic arthralgias of knees and hips (Right); Postmenopausal; and Body mass index (BMI) of 40.0-44.9 in adult [facility]) on their problem list. Her primarily concern today is the Back Pain  Pain Assessment: Location: Right, Left, Lower Back Radiating: pain radiaties down both leg, worse on right leg Onset: More than a month ago Duration: Chronic pain Quality: Sharp Severity: 5 /10 (subjective, self-reported pain score)  Note: Reported level is compatible with observation.                          Effect on ADL: limits my daily activities Timing: Constant Modifying factors: aspere cream pad, medication BP: (!) 87/66  HR: 71  Laura [name] was last scheduled for an appointment on 01/17/2018 for medication management. During today's appointment we reviewed Laura [name]'s chronic pain status, as well as her outpatient medication regimen.  The patient  reports no history of drug use. Her body mass index is 43.93 kg/m.  Further details on both, my assessment(s), as well as the proposed treatment plan, please see below.  Controlled Substance Pharmacotherapy Assessment REMS (Risk Evaluation and Mitigation Strategy)  Analgesic:Oxycodone IR 5 mg every 6 hours (20 mg/day) MME/day:30 mg/day Chauncey Fischer, RN  01/18/2018 11:01 AM  Sign when Signing Visit Nursing Pain Medication Assessment:  Safety precautions to be maintained throughout the outpatient stay will include: orient to surroundings, keep bed in low position, maintain call bell within reach at all times, provide assistance with transfer out of bed and ambulation.  Medication Inspection Compliance: Ms. Denard did not comply with our request to bring her pills to be counted. She was reminded that bringing the medication bottles, even when empty, is a requirement.  Medication: None brought  in. Pill/Patch Count: None available to be  counted. Bottle Appearance: No container available. Did not bring bottle(s) to appointment. Filled Date: N/A Last Medication intake:  Yesterday   Pharmacokinetics: Liberation and absorption (onset of action): WNL Distribution (time to peak effect): WNL Metabolism and excretion (duration of action): WNL         Pharmacodynamics: Desired effects: Analgesia: Ms. Skorupski reports >50% benefit. Functional ability: Patient reports that medication allows her to accomplish basic ADLs Clinically meaningful improvement in function (CMIF): Sustained CMIF goals met Perceived effectiveness: Described as relatively effective, allowing for increase in activities of daily living (ADL) Undesirable effects: Side-effects or Adverse reactions: None reported Monitoring: [place] PMP: Online review of the past 37-monthperiod conducted. Compliant with practice rules and regulations Last UDS on record: Summary  Date Value Ref Range Status  10/18/2017 FINAL  Final    Comment:    ==================================================================== TOXASSURE SELECT 13 (MW) ==================================================================== Test                             Result       Flag       Units Drug Present not Declared for Prescription Verification   Tramadol                       >2370        UNEXPECTED ng/mg creat   O-Desmethyltramadol            1950         UNEXPECTED ng/mg creat   N-Desmethyltramadol            >2370        UNEXPECTED ng/mg creat    Source of tramadol is a prescription medication.    O-desmethyltramadol and N-desmethyltramadol are expected    metabolites of tramadol. Drug Absent but Declared for Prescription Verification   Oxycodone                      Not Detected UNEXPECTED ng/mg creat ==================================================================== Test                      Result    Flag   Units      Ref Range   Creatinine               211              mg/dL      >=20 ==================================================================== Declared Medications:  The flagging and interpretation on this report are based on the  following declared medications.  Unexpected results may arise from  inaccuracies in the declared medications.  **Note: The testing scope of this panel includes these medications:  Oxycodone  **Note: The testing scope of this panel does not include following  reported medications:  Aspirin (Aspirin 81)  Benzonatate  Chlorthalidone  Docusate  Duloxetine  Esomeprazole  Fluticasone  Folic acid  Furosemide  Hydroxychloroquine  Infliximab  Levothyroxine  Loratadine  Methotrexate  Metoprolol  Quetiapine  Simvastatin  Spironolactone  Umeclidinium ==================================================================== For clinical consultation, please call ([phone] ====================================================================    UDS interpretation: Non-Compliant          Medication Assessment Form: Reviewed. Abnormalities discussed Treatment compliance: Deficiencies noted and steps taken to remind the patient of the seriousness of adequate therapy compliance Risk Assessment Profile: Aberrant behavior:Using drugs from spouse Comorbid factors increasing risk of overdose: See prior notes. No additional risks detected today Opioid risk tool (ORT) (Total  Score): 14 Personal History of Substance Abuse (SUD-Substance use disorder):  Alcohol: Positive Female or Female  Illegal Drugs: Positive Female or Female  Rx Drugs: Negative  ORT Risk Level calculation: High Risk Risk of substance use disorder (SUD): High Opioid Risk Tool - 01/18/18 1117      Family History of Substance Abuse   Alcohol  Positive Female    Illegal Drugs  Positive Female    Rx Drugs  Negative      Personal History of Substance Abuse   Alcohol  Positive Female or Female    Illegal Drugs  Positive Female or Female     Rx Drugs  Negative      Age   Age between 82-45 years   No      History of Preadolescent Sexual Abuse   History of Preadolescent Sexual Abuse  Negative or Female      Psychological Disease   Psychological Disease  Negative    Depression  Positive      Total Score   Opioid Risk Tool Scoring  14    Opioid Risk Interpretation  High Risk      ORT Scoring interpretation table:  Score <3 = Low Risk for SUD  Score between 4-7 = Moderate Risk for SUD  Score >8 = High Risk for Opioid Abuse   Risk Mitigation Strategies:  Patient Counseling: Completed today. Counseling provided to patient as per "Patient Counseling Document".Patient-Prescriber Agreement (PPA): Present and active  Notification to other healthcare providers: Done  Pharmacologic Plan: Therapy adjustment:           Monthly monitoring Laboratory Chemistry  Inflammation Markers (CRP: Acute Phase) (ESR: Chronic Phase) Lab Results  Component Value Date   CRP 1.9 (H) 07/15/2015   ESRSEDRATE 32 (H) 07/15/2015                         Rheumatology Markers No results found for: RF, ANA, LABURIC, URICUR, LYMEIGGIGMAB, LYMEABIGMQN, HLAB27                      Renal Function Markers Lab Results  Component Value Date   BUN 36 (H) 05/23/2017   CREATININE 1.63 (H) 05/23/2017   GFRAA 35 (L) 05/23/2017   GFRNONAA 30 (L) 05/23/2017                             Hepatic Function Markers Lab Results  Component Value Date   AST 19 07/15/2015   ALT 15 07/15/2015   ALBUMIN 4.0 07/15/2015   ALKPHOS 73 07/15/2015                        Electrolytes Lab Results  Component Value Date   NA 134 (L) 05/23/2017   K 2.8 (L) 05/23/2017   CL 87 (L) 05/23/2017   CALCIUM 9.1 05/23/2017   MG 2.1 07/15/2015                        Neuropathy Markers Lab Results  Component Value Date   VITAMINB12 610 07/15/2015                        CNS Tests No results found for: COLORCSF, APPEARCSF, RBCCOUNTCSF, WBCCSF, POLYSCSF, LYMPHSCSF, EOSCSF,  PROTEINCSF, GLUCCSF, JCVIRUS, CSFOLI, IGGCSF  Bone Pathology Markers Lab Results  Component Value Date   25OHVITD1 36 07/15/2015   25OHVITD2 3.6 07/15/2015   25OHVITD3 32 07/15/2015                         Coagulation Parameters Lab Results  Component Value Date   PLT 305 05/23/2017                        Cardiovascular Markers Lab Results  Component Value Date   BNP 42.0 05/23/2017   TROPONINI <0.03 05/23/2017   HGB 14.7 05/23/2017   HCT 43.0 05/23/2017                         CA Markers No results found for: CEA, CA125, LABCA2                      Note: Lab results reviewed.  Recent Diagnostic Imaging Results  DG Chest Port 1 View CLINICAL DATA:  c/o congestion, weakness since Saturday.  EXAM: PORTABLE CHEST 1 VIEW  COMPARISON:  None.  FINDINGS: Borderline cardiomegaly. Lungs are clear. No pleural effusion or pneumothorax seen. No acute or suspicious osseous finding.  IMPRESSION: No active disease. No evidence of pneumonia or pulmonary edema. Borderline cardiomegaly.  Electronically Signed   By: Franki Cabot M.D.   On: 05/23/2017 23:52  Complexity Note: Imaging results reviewed. Results shared with Laura [name], using Layman's terms.                         Meds   Current Outpatient Medications:  .  aspirin EC 81 MG tablet, Take by mouth., Disp: , Rfl:  .  chlorthalidone (HYGROTON) 25 MG tablet, TAKE 1/2 TABLET (12.5MG) BY MOUTH EVERY DAY, Disp: , Rfl: 11 .  Cholecalciferol (VITAMIN D) 2000 UNITS tablet, Take by mouth daily. , Disp: , Rfl:  .  docusate sodium (COLACE) 100 MG capsule, Take 100 mg by mouth 2 (two) times daily., Disp: , Rfl:  .  DULoxetine (CYMBALTA) 30 MG capsule, Two tablets in the morning, and one in the evening., Disp: 270 capsule, Rfl: 0 .  esomeprazole (NEXIUM) 20 MG capsule, Take by mouth daily. , Disp: , Rfl:  .  Fluticasone-Umeclidin-Vilant (TRELEGY ELLIPTA) 100-62.5-25 MCG/INH AEPB, Inhale into the lungs.,  Disp: , Rfl:  .  folic acid (FOLVITE) 1 MG tablet, Take 1 mg by mouth daily., Disp: , Rfl:  .  furosemide (LASIX) 40 MG tablet, TAKE 1 TABLET ONCE DAILY, Disp: , Rfl:  .  hydroxychloroquine (PLAQUENIL) 200 MG tablet, Take 200 mg by mouth daily. , Disp: , Rfl:  .  inFLIXimab (REMICADE) 100 MG injection, Inject 100 mg into the vein every 8 (eight) weeks., Disp: , Rfl:  .  levothyroxine (SYNTHROID, LEVOTHROID) 200 MCG tablet, TAKE 1 TABLET ONCE DAILY- ON AN EMPTY STOMACH WITH A GLASS OF WATER 30-60 MINUTES BEFORE BREAKFAST, Disp: , Rfl: 5 .  loratadine (CLARITIN) 10 MG tablet, Take by mouth daily as needed. , Disp: , Rfl:  .  methotrexate (RHEUMATREX) 2.5 MG tablet, TAKE 4 TABLETS (10 MG TOTAL) BY MOUTH EVERY 7 (SEVEN) DAYS WITH A MEAL, Disp: , Rfl: 5 .  metoprolol succinate (TOPROL-XL) 50 MG 24 hr tablet, TAKE 1 TABLET (50 MG TOTAL) BY MOUTH ONCE DAILY., Disp: , Rfl: 1 .  Multiple Vitamin (MULTI-VITAMINS) TABS, Take by mouth., Disp: ,  Rfl:  .  QUEtiapine (SEROQUEL) 200 MG tablet, Take 1 tablet (200 mg total) by mouth at bedtime., Disp: 90 tablet, Rfl: 0 .  QUEtiapine (SEROQUEL) 50 MG tablet, Take 1 tablet (50 mg total) by mouth 2 (two) times daily., Disp: 180 tablet, Rfl: 0 .  simvastatin (ZOCOR) 10 MG tablet, Take 10 mg by mouth daily., Disp: , Rfl:  .  Spacer/Aero Chamber Mouthpiece MISC, 1 Units by Does not apply route every 4 (four) hours as needed (wheezing)., Disp: 1 each, Rfl: 0 .  spironolactone (ALDACTONE) 25 MG tablet, TAKE 1/2 (ONE-HALF) TABLET BY MOUTH DAILY, Disp: , Rfl: 11 .  UNABLE TO FIND, cholecalciferol (vitamin D3) 125 mcg (5,000 unit) capsule  TAKE 1 CAPSULE DAILY, Disp: , Rfl:  .  UNABLE TO FIND, duloxetine 60 mg capsule,delayed release, Disp: , Rfl:  .  oxyCODONE (OXY IR/ROXICODONE) 5 MG immediate release tablet, Take 1 tablet (5 mg total) by mouth every 6 (six) hours as needed for severe pain., Disp: 120 tablet, Rfl: 0 .  oxyCODONE (OXY IR/ROXICODONE) 5 MG immediate release  tablet, Take 1 tablet (5 mg total) by mouth every 6 (six) hours as needed for severe pain., Disp: 120 tablet, Rfl: 0 .  oxyCODONE (OXY IR/ROXICODONE) 5 MG immediate release tablet, Take 1 tablet (5 mg total) by mouth every 6 (six) hours as needed for severe pain., Disp: 120 tablet, Rfl: 0  ROS  Constitutional: Denies any fever or chills Gastrointestinal: No reported hemesis, hematochezia, vomiting, or acute GI distress Musculoskeletal: Denies any acute onset joint swelling, redness, loss of ROM, or weakness Neurological: No reported episodes of acute onset apraxia, aphasia, dysarthria, agnosia, amnesia, paralysis, loss of coordination, or loss of consciousness  Allergies  Laura [name] is allergic to bupropion and penicillins.  PFSH  Drug: Laura [name]  reports no history of drug use. Alcohol:  reports no history of alcohol use. Tobacco:  reports that she has never smoked. She has never used smokeless tobacco. Medical:  has a past medical history of Allergy, Anxiety, Arthritis, degenerative (10/08/2013), Chronic kidney disease, Depression, Lumbar spinal stenosis with neurogenic claudication (11/07/2014), Major depression, single episode, in complete remission ([place]) (06/25/2015), Memory loss, short term (03/19/2014), Seizure ([place]) (10/07/2014), Sleep apnea, and Thyroid disease. Surgical: Laura [name]  has a past surgical history that includes Abdominal hysterectomy; Cesarean section; Replacement total knee (Left); and Hip surgery (Right). Family: family history includes Alcohol abuse in her father; Depression in her father and sister; Heart attack in her father; Hypertension in her mother and sister; Post-traumatic stress disorder in her father; Rheum arthritis in her sister; Stroke in her mother.  Constitutional Exam  General appearance: Well nourished, well developed, and well hydrated. In no apparent acute distress Vitals:   01/18/18 1101  BP: (!) 87/66  Pulse: 71  Temp: 97.9 F (36.6 C)  SpO2:  94%  Weight: 264 lb (119.7 kg)  Height: '5\' 5"'$  (1.651 m)  Psych/Mental status: Alert, oriented x 3 (person, place, & time)       Eyes: PERLA Respiratory: No evidence of acute respiratory distress   Lumbar Spine Area Exam  Skin & Axial Inspection: No masses, redness, or swelling Alignment: Symmetrical Functional ROM: Unrestricted ROM       Stability: No instability detected Muscle Tone/Strength: Functionally intact. No obvious neuro-muscular anomalies detected. Sensory (Neurological): Unimpaired Palpation: No palpable anomalies        Gait & Posture Assessment  Ambulation: Patient ambulates using a walker Gait: Relatively normal for age and body  habitus Posture: WNL   Lower Extremity Exam    Side: Right lower extremity  Side: Left lower extremity  Stability: No instability observed          Stability: No instability observed          Skin & Extremity Inspection: Skin color, temperature, and hair growth are WNL. No peripheral edema or cyanosis. No masses, redness, swelling, asymmetry, or associated skin lesions. No contractures.  Skin & Extremity Inspection: Skin color, temperature, and hair growth are WNL. No peripheral edema or cyanosis. No masses, redness, swelling, asymmetry, or associated skin lesions. No contractures.  Functional ROM: Unrestricted ROM                  Functional ROM: Unrestricted ROM                  Muscle Tone/Strength: Functionally intact. No obvious neuro-muscular anomalies detected.  Muscle Tone/Strength: Functionally intact. No obvious neuro-muscular anomalies detected.  Sensory (Neurological): Unimpaired        Sensory (Neurological): Unimpaired                 Assessment  Primary Diagnosis & Pertinent Problem List: The primary encounter diagnosis was Seronegative rheumatoid arthritis ([place]). Diagnoses of Lumbar spondylosis, Primary osteoarthritis of both knees, Osteoarthritis of both hips, unspecified osteoarthritis type, and Chronic pain syndrome were  also pertinent to this visit.  Status Diagnosis  Persistent Persistent Persistent 1. Seronegative rheumatoid arthritis ([place])   2. Lumbar spondylosis   3. Primary osteoarthritis of both knees   4. Osteoarthritis of both hips, unspecified osteoarthritis type   5. Chronic pain syndrome     Problems updated and reviewed during this visit: No problems updated. Plan of Care  Pharmacotherapy (Medications Ordered): Meds ordered this encounter  Medications  . oxyCODONE (OXY IR/ROXICODONE) 5 MG immediate release tablet    Sig: Take 1 tablet (5 mg total) by mouth every 6 (six) hours as needed for severe pain.    Dispense:  120 tablet    Refill:  0    Do not add this medication to the electronic "Automatic Refill" notification system. Patient may have prescription filled one day early if pharmacy is closed on scheduled refill date.    Order Specific Question:   Supervising Provider    Answer:   Milinda Pointer [[id number]]   New Prescriptions   No medications on file   Medications administered today: Laura [name] had no medications administered during this visit. Lab-work, procedure(s), and/or referral(s): No orders of the defined types were placed in this encounter.  Imaging and/or referral(s): None  Interventional therapies: Planned, scheduled, and/or pending:  Not at this time.   Considering:  Therapeutic series of 5 Hyalgan right knee injections  Diagnostic intra-articular left hip joint injection  Possible left hip radiofrequency ablation  Diagnostic right intra-articular knee joint injection with local anesthetic and steroid  Diagnostic bilateral genicular nerve block  Possible bilateral genicular nerve radiofrequency ablation  Diagnostic bilateral lumbar facet block  Possible bilateral lumbar facet radiofrequency ablation  Diagnostic bilateral L3-4 and/or L5-S1 transforaminal epidural steroid injections  Diagnostic L3-4 versus L4-5 lumbar epidural steroid injection     Palliative PRN treatment(s):  Therapeutic series of 5 Hyalgan right knee injections Diagnostic intra-articular left hip joint injection  Diagnostic right intra-articular knee joint injection with local anesthetic and steroid  Diagnostic bilateral genicular nerve block  Diagnostic bilateral lumbar facet block  Diagnostic bilateral L3-4 and/or L5-S1 transforaminal epidural steroid injections  Diagnostic L3-4 versus  L4-5 lumbar epidural steroid injection    Provider-requested follow-up: Return in about 4 weeks (around 02/15/2018) for MedMgmt.  Future Appointments  Date Time Provider [place]  02/01/2018  2:00 PM Ursula Alert, MD [facility] None  02/14/2018  2:30 PM Vevelyn Francois, NP [facility] None  04/04/2018  1:45 PM [facility] None  04/24/2018  3:45 PM Allyne Gee, MD [place] None   Primary Care Physician: Glendon Axe, MD Location: [facility] Note by: Vevelyn Francois NP Date: 01/18/2018; Time: 6:52 PM  Pain Score Disclaimer: We use the NRS-11 scale. This is a self-reported, subjective measurement of pain severity with only modest accuracy. It is used primarily to identify changes within a particular patient. It must be understood that outpatient pain scales are significantly less accurate that those used for research, where they can be applied under ideal controlled circumstances with minimal exposure to variables. In reality, the score is likely to be a combination of pain intensity and pain affect, where pain affect describes the degree of emotional arousal or changes in action readiness caused by the sensory experience of pain. Factors such as social and work situation, setting, emotional state, anxiety levels, expectation, and prior pain experience may influence pain perception and show large inter-individual differences that may also be affected by time variables.  Patient instructions provided during this  appointment: Patient Instructions  ____________________________________________________________________________________________  Medication Rules  Purpose: To inform patients, and their family members, of our rules and regulations.  Applies to: All patients receiving prescriptions (written or electronic).  Pharmacy of record: Pharmacy where electronic prescriptions will be sent. If written prescriptions are taken to a different pharmacy, please inform the nursing staff. The pharmacy listed in the electronic medical record should be the one where you would like electronic prescriptions to be sent.  Electronic prescriptions: In compliance with the [place] (STOP) Act of 2017 (Session Lanny Cramp [phone]), effective January 10, 2018, all controlled substances must be electronically prescribed. Calling prescriptions to the pharmacy will cease to exist.  Prescription refills: Only during scheduled appointments. Applies to all prescriptions.  NOTE: The following applies primarily to controlled substances (Opioid* Pain Medications).   Patient's responsibilities: 1. Pain Pills: Bring all pain pills to every appointment (except for procedure appointments). 2. Pill Bottles: Bring pills in original pharmacy bottle. Always bring the newest bottle. Bring bottle, even if empty. 3. Medication refills: You are responsible for knowing and keeping track of what medications you take and those you need refilled. The day before your appointment: write a list of all prescriptions that need to be refilled. The day of the appointment: give the list to the admitting nurse. Prescriptions will be written only during appointments. If you forget a medication: it will not be "Called in", "Faxed", or "electronically sent". You will need to get another appointment to get these prescribed. No early refills. Do not call asking to have your prescription filled early. 4. Prescription  Accuracy: You are responsible for carefully inspecting your prescriptions before leaving our office. Have the discharge nurse carefully go over each prescription with you, before taking them home. Make sure that your name is accurately spelled, that your address is correct. Check the name and dose of your medication to make sure it is accurate. Check the number of pills, and the written instructions to make sure they are clear and accurate. Make sure that you are given enough medication to last until your next medication refill appointment.  5. Taking Medication: Take medication as prescribed. When it comes to controlled substances, taking less pills or less frequently than prescribed is permitted and encouraged. Never take more pills than instructed. Never take medication more frequently than prescribed.  6. Inform other Doctors: Always inform, all of your healthcare providers, of all the medications you take. 7. Pain Medication from other Providers: You are not allowed to accept any additional pain medication from any other Doctor or Healthcare provider. There are two exceptions to this rule. (see below) In the event that you require additional pain medication, you are responsible for notifying us, as stated below. 8. Medication Agreement: You are responsible for carefully reading and following our Medication Agreement. This must be signed before receiving any prescriptions from our practice. Safely store a copy of your signed Agreement. Violations to the Agreement will result in no further prescriptions. (Additional copies of our Medication Agreement are available upon request.) 9. Laws, Rules, & Regulations: All patients are expected to follow all Federal and [employer], Rules, [employer]. Ignorance of the Laws does not constitute a valid excuse. The use of any illegal substances is prohibited. 10. Adopted CDC guidelines & recommendations: Target dosing levels will be at or below 60 MME/day.  Use of benzodiazepines** is not recommended.  Exceptions: There are only two exceptions to the rule of not receiving pain medications from other Healthcare Providers. 1. Exception #1 (Emergencies): In the event of an emergency (i.e.: accident requiring emergency care), you are allowed to receive additional pain medication. However, you are responsible for: As soon as you are able, call our office [phone], at any time of the day or night, and leave a message stating your name, the date and nature of the emergency, and the name and dose of the medication prescribed. In the event that your call is answered by a member of our staff, make sure to document and save the date, time, and the name of the person that took your information.  2. Exception #2 (Planned Surgery): In the event that you are scheduled by another doctor or dentist to have any type of surgery or procedure, you are allowed (for a period no longer than 30 days), to receive additional pain medication, for the acute post-op pain. However, in this case, you are responsible for picking up a copy of our "Post-op Pain Management for Surgeons" handout, and giving it to your surgeon or dentist. This document is available at our office, and does not require an appointment to obtain it. Simply go to our office during business hours (Monday-Thursday from 8:00 AM to 4:00 PM) (Friday 8:00 AM to 12:00 Noon) or if you have a scheduled appointment with [place], prior to your surgery, and ask for it by name. In addition, you will need to provide [place] with your name, name of your surgeon, type of surgery, and date of procedure or surgery.  *Opioid medications include: morphine, codeine, oxycodone, oxymorphone, hydrocodone, hydromorphone, meperidine, tramadol, tapentadol, buprenorphine, fentanyl, methadone. **Benzodiazepine medications include: diazepam (Valium), alprazolam (Xanax), clonazepam (Klonopine), lorazepam (Ativan), clorazepate (Tranxene), chlordiazepoxide  (Librium), estazolam (Prosom), oxazepam (Serax), temazepam (Restoril), triazolam (Halcion) (Last updated: 03/09/2017) ____________________________________________________________________________________________   She admits that she is having increased leg pain. She amdits that this is related to her RA.  She admits that she takes a stool softener for the constipation She admits that her husband is taking Tramadol.

## 2018-01-18 NOTE — Patient Instructions (Addendum)
____________________________________________________________________________________________  Medication Rules  Purpose: To inform patients, and their family members, of our rules and regulations.  Applies to: All patients receiving prescriptions (written or electronic).  Pharmacy of record: Pharmacy where electronic prescriptions will be sent. If written prescriptions are taken to a different pharmacy, please inform the nursing staff. The pharmacy listed in the electronic medical record should be the one where you would like electronic prescriptions to be sent.  Electronic prescriptions: In compliance with the [place] Strengthen Opioid Misuse Prevention (STOP) Act of 2017 (Session Law 2017-74/H243), effective January 10, 2018, all controlled substances must be electronically prescribed. Calling prescriptions to the pharmacy will cease to exist.  Prescription refills: Only during scheduled appointments. Applies to all prescriptions.  NOTE: The following applies primarily to controlled substances (Opioid* Pain Medications).   Patient's responsibilities: 1. Pain Pills: Bring all pain pills to every appointment (except for procedure appointments). 2. Pill Bottles: Bring pills in original pharmacy bottle. Always bring the newest bottle. Bring bottle, even if empty. 3. Medication refills: You are responsible for knowing and keeping track of what medications you take and those you need refilled. The day before your appointment: write a list of all prescriptions that need to be refilled. The day of the appointment: give the list to the admitting nurse. Prescriptions will be written only during appointments. If you forget a medication: it will not be "Called in", "Faxed", or "electronically sent". You will need to get another appointment to get these prescribed. No early refills. Do not call asking to have your prescription filled early. 4. Prescription Accuracy: You are responsible for  carefully inspecting your prescriptions before leaving our office. Have the discharge nurse carefully go over each prescription with you, before taking them home. Make sure that your name is accurately spelled, that your address is correct. Check the name and dose of your medication to make sure it is accurate. Check the number of pills, and the written instructions to make sure they are clear and accurate. Make sure that you are given enough medication to last until your next medication refill appointment. 5. Taking Medication: Take medication as prescribed. When it comes to controlled substances, taking less pills or less frequently than prescribed is permitted and encouraged. Never take more pills than instructed. Never take medication more frequently than prescribed.  6. Inform other Doctors: Always inform, all of your healthcare providers, of all the medications you take. 7. Pain Medication from other Providers: You are not allowed to accept any additional pain medication from any other Doctor or Healthcare provider. There are two exceptions to this rule. (see below) In the event that you require additional pain medication, you are responsible for notifying us, as stated below. 8. Medication Agreement: You are responsible for carefully reading and following our Medication Agreement. This must be signed before receiving any prescriptions from our practice. Safely store a copy of your signed Agreement. Violations to the Agreement will result in no further prescriptions. (Additional copies of our Medication Agreement are available upon request.) 9. Laws, Rules, & Regulations: All patients are expected to follow all Federal and State Laws, Statutes, Rules, & Regulations. Ignorance of the Laws does not constitute a valid excuse. The use of any illegal substances is prohibited. 10. Adopted CDC guidelines & recommendations: Target dosing levels will be at or below 60 MME/day. Use of benzodiazepines** is not  recommended.  Exceptions: There are only two exceptions to the rule of not receiving pain medications from other Healthcare Providers. 1.   Exception #1 (Emergencies): In the event of an emergency (i.e.: accident requiring emergency care), you are allowed to receive additional pain medication. However, you are responsible for: As soon as you are able, call our office [phone], at any time of the day or night, and leave a message stating your name, the date and nature of the emergency, and the name and dose of the medication prescribed. In the event that your call is answered by a member of our staff, make sure to document and save the date, time, and the name of the person that took your information.  2. Exception #2 (Planned Surgery): In the event that you are scheduled by another doctor or dentist to have any type of surgery or procedure, you are allowed (for a period no longer than 30 days), to receive additional pain medication, for the acute post-op pain. However, in this case, you are responsible for picking up a copy of our "Post-op Pain Management for Surgeons" handout, and giving it to your surgeon or dentist. This document is available at our office, and does not require an appointment to obtain it. Simply go to our office during business hours (Monday-Thursday from 8:00 AM to 4:00 PM) (Friday 8:00 AM to 12:00 Noon) or if you have a scheduled appointment with [place], prior to your surgery, and ask for it by name. In addition, you will need to provide [place] with your name, name of your surgeon, type of surgery, and date of procedure or surgery.  *Opioid medications include: morphine, codeine, oxycodone, oxymorphone, hydrocodone, hydromorphone, meperidine, tramadol, tapentadol, buprenorphine, fentanyl, methadone. **Benzodiazepine medications include: diazepam (Valium), alprazolam (Xanax), clonazepam (Klonopine), lorazepam (Ativan), clorazepate (Tranxene), chlordiazepoxide (Librium), estazolam (Prosom),  oxazepam (Serax), temazepam (Restoril), triazolam (Halcion) (Last updated: 03/09/2017) ____________________________________________________________________________________________   She admits that she is having increased leg pain. She amdits that this is related to her RA.  She admits that she takes a stool softener for the constipation She admits that her husband is taking Tramadol.

## 2018-01-18 NOTE — Progress Notes (Signed)
Nursing Pain Medication Assessment:  Safety precautions to be maintained throughout the outpatient stay will include: orient to surroundings, keep bed in low position, maintain call bell within reach at all times, provide assistance with transfer out of bed and ambulation.  Medication Inspection Compliance: Laura [name] did not comply with our request to bring her pills to be counted. She was reminded that bringing the medication bottles, even when empty, is a requirement.  Medication: None brought in. Pill/Patch Count: None available to be counted. Bottle Appearance: No container available. Did not bring bottle(s) to appointment. Filled Date: N/A Last Medication intake:  Yesterday

## 2018-01-25 ENCOUNTER — Telehealth: Payer: Self-pay

## 2018-01-25 ENCOUNTER — Telehealth: Payer: Self-pay | Admitting: Psychiatry

## 2018-01-25 DIAGNOSIS — F3342 Major depressive disorder, recurrent, in full remission: Secondary | ICD-10-CM

## 2018-01-25 MED ORDER — QUETIAPINE FUMARATE 50 MG PO TABS: 50.0000 mg | ORAL_TABLET | Freq: Two times a day (BID) | ORAL | 0 refills | Status: DC

## 2018-01-25 MED ORDER — DULOXETINE HCL 30 MG PO CPEP: ORAL_CAPSULE | ORAL | 0 refills | Status: DC

## 2018-01-25 MED ORDER — QUETIAPINE FUMARATE 200 MG PO TABS: 200.0000 mg | ORAL_TABLET | Freq: Every day | ORAL | 0 refills | Status: DC

## 2018-01-25 NOTE — Telephone Encounter (Signed)
received a request for refill on quetiapine fumarate 50mg    QUEtiapine (SEROQUEL) 50 MG tablet  Medication  Date: 11/02/2017 Department: [facility] Ordering/Authorizing: Patrick North, MD  Order Providers   Prescribing Provider Encounter Provider  Patrick North, MD Patrick North, MD  Outpatient Medication Detail    Disp Refills Start End   QUEtiapine (SEROQUEL) 50 MG tablet 180 tablet 0 11/02/2017    Sig - Route: Take 1 tablet (50 mg total) by mouth 2 (two) times daily. - Oral   Class: Print

## 2018-01-25 NOTE — Telephone Encounter (Signed)
Sent cymbalta,seroquel to pharmacy

## 2018-01-31 ENCOUNTER — Other Ambulatory Visit: Payer: Self-pay | Admitting: Internal Medicine

## 2018-01-31 DIAGNOSIS — Z1231 Encounter for screening mammogram for malignant neoplasm of breast: Secondary | ICD-10-CM

## 2018-02-01 ENCOUNTER — Ambulatory Visit: Payer: Medicare Other | Admitting: Psychiatry

## 2018-02-01 ENCOUNTER — Telehealth: Payer: Self-pay | Admitting: Psychiatry

## 2018-02-01 NOTE — Telephone Encounter (Signed)
Patient of Dr.Ravi's presented 20 minutes late for her appointment. When staff - Clint Lipps and Shanda Bumps discussed with patient that she is late for her appointment and that she needs to either reschedule or wait until a slot opens up later on today patient became upset and reported she wants to think about whether to reschedule the appointment or not.

## 2018-02-05 NOTE — Telephone Encounter (Signed)
Pt has been called and left several messages. 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 Patient has not returned calls.

## 2018-02-05 NOTE — Telephone Encounter (Signed)
Ok,thanks for letting me know. If she needs enough refills to get her to her appointment with Dr.Ravi here or another provider , I will be happy to.

## 2018-02-14 ENCOUNTER — Ambulatory Visit: Payer: Medicare Other | Attending: Nurse Practitioner | Admitting: Nurse Practitioner

## 2018-02-14 ENCOUNTER — Other Ambulatory Visit: Payer: Self-pay

## 2018-02-14 ENCOUNTER — Encounter: Payer: Self-pay | Admitting: Nurse Practitioner

## 2018-02-14 VITALS — BP 118/62 | HR 91 | Temp 98.3°F | Resp 18 | Ht 65.0 in | Wt 260.0 lb

## 2018-02-14 DIAGNOSIS — M47816 Spondylosis without myelopathy or radiculopathy, lumbar region: Secondary | ICD-10-CM | POA: Insufficient documentation

## 2018-02-14 DIAGNOSIS — M0600 Rheumatoid arthritis without rheumatoid factor, unspecified site: Secondary | ICD-10-CM | POA: Diagnosis present

## 2018-02-14 DIAGNOSIS — G894 Chronic pain syndrome: Secondary | ICD-10-CM | POA: Insufficient documentation

## 2018-02-14 DIAGNOSIS — Z6841 Body Mass Index (BMI) 40.0 and over, adult: Secondary | ICD-10-CM | POA: Diagnosis present

## 2018-02-14 MED ORDER — OXYCODONE HCL 5 MG PO TABS: 5.0000 mg | ORAL_TABLET | Freq: Four times a day (QID) | ORAL | 0 refills | Status: DC | PRN

## 2018-02-14 NOTE — Patient Instructions (Addendum)
____________________________________________________________________________________________  Medication Rules  Purpose: To inform patients, and their family members, of our rules and regulations.  Applies to: All patients receiving prescriptions (written or electronic).  Pharmacy of record: Pharmacy where electronic prescriptions will be sent. If written prescriptions are taken to a different pharmacy, please inform the nursing staff. The pharmacy listed in the electronic medical record should be the one where you would like electronic prescriptions to be sent.  Electronic prescriptions: In compliance with the [place] Strengthen Opioid Misuse Prevention (STOP) Act of 2017 (Session Law 2017-74/H243), effective January 10, 2018, all controlled substances must be electronically prescribed. Calling prescriptions to the pharmacy will cease to exist.  Prescription refills: Only during scheduled appointments. Applies to all prescriptions.  NOTE: The following applies primarily to controlled substances (Opioid* Pain Medications).   Patient's responsibilities: 1. Pain Pills: Bring all pain pills to every appointment (except for procedure appointments). 2. Pill Bottles: Bring pills in original pharmacy bottle. Always bring the newest bottle. Bring bottle, even if empty. 3. Medication refills: You are responsible for knowing and keeping track of what medications you take and those you need refilled. The day before your appointment: write a list of all prescriptions that need to be refilled. The day of the appointment: give the list to the admitting nurse. Prescriptions will be written only during appointments. If you forget a medication: it will not be "Called in", "Faxed", or "electronically sent". You will need to get another appointment to get these prescribed. No early refills. Do not call asking to have your prescription filled early. 4. Prescription Accuracy: You are responsible for  carefully inspecting your prescriptions before leaving our office. Have the discharge nurse carefully go over each prescription with you, before taking them home. Make sure that your name is accurately spelled, that your address is correct. Check the name and dose of your medication to make sure it is accurate. Check the number of pills, and the written instructions to make sure they are clear and accurate. Make sure that you are given enough medication to last until your next medication refill appointment. 5. Taking Medication: Take medication as prescribed. When it comes to controlled substances, taking less pills or less frequently than prescribed is permitted and encouraged. Never take more pills than instructed. Never take medication more frequently than prescribed.  6. Inform other Doctors: Always inform, all of your healthcare providers, of all the medications you take. 7. Pain Medication from other Providers: You are not allowed to accept any additional pain medication from any other Doctor or Healthcare provider. There are two exceptions to this rule. (see below) In the event that you require additional pain medication, you are responsible for notifying us, as stated below. 8. Medication Agreement: You are responsible for carefully reading and following our Medication Agreement. This must be signed before receiving any prescriptions from our practice. Safely store a copy of your signed Agreement. Violations to the Agreement will result in no further prescriptions. (Additional copies of our Medication Agreement are available upon request.) 9. Laws, Rules, & Regulations: All patients are expected to follow all Federal and State Laws, Statutes, Rules, & Regulations. Ignorance of the Laws does not constitute a valid excuse. The use of any illegal substances is prohibited. 10. Adopted CDC guidelines & recommendations: Target dosing levels will be at or below 60 MME/day. Use of benzodiazepines** is not  recommended.  Exceptions: There are only two exceptions to the rule of not receiving pain medications from other Healthcare Providers. 1.   Exception #1 (Emergencies): In the event of an emergency (i.e.: accident requiring emergency care), you are allowed to receive additional pain medication. However, you are responsible for: As soon as you are able, call our office [phone], at any time of the day or night, and leave a message stating your name, the date and nature of the emergency, and the name and dose of the medication prescribed. In the event that your call is answered by a member of our staff, make sure to document and save the date, time, and the name of the person that took your information.  2. Exception #2 (Planned Surgery): In the event that you are scheduled by another doctor or dentist to have any type of surgery or procedure, you are allowed (for a period no longer than 30 days), to receive additional pain medication, for the acute post-op pain. However, in this case, you are responsible for picking up a copy of our "Post-op Pain Management for Surgeons" handout, and giving it to your surgeon or dentist. This document is available at our office, and does not require an appointment to obtain it. Simply go to our office during business hours (Monday-Thursday from 8:00 AM to 4:00 PM) (Friday 8:00 AM to 12:00 Noon) or if you have a scheduled appointment with [place], prior to your surgery, and ask for it by name. In addition, you will need to provide [place] with your name, name of your surgeon, type of surgery, and date of procedure or surgery.  *Opioid medications include: morphine, codeine, oxycodone, oxymorphone, hydrocodone, hydromorphone, meperidine, tramadol, tapentadol, buprenorphine, fentanyl, methadone. **Benzodiazepine medications include: diazepam (Valium), alprazolam (Xanax), clonazepam (Klonopine), lorazepam (Ativan), clorazepate (Tranxene), chlordiazepoxide (Librium), estazolam (Prosom),  oxazepam (Serax), temazepam (Restoril), triazolam (Halcion) (Last updated: 03/09/2017) ____________________________________________________________________________________________    BMI interpretation table: BMI level Category Range association with higher incidence of chronic pain  <18 kg/m2 Underweight   18.5-24.9 kg/m2 Ideal body weight   25-29.9 kg/m2 Overweight Increased incidence by 20%  30-34.9 kg/m2 Obese (Class I) Increased incidence by 68%  35-39.9 kg/m2 Severe obesity (Class II) Increased incidence by 136%  >40 kg/m2 Extreme obesity (Class III) Increased incidence by 254%   Patient's current BMI Ideal Body weight  Body mass index is 43.27 kg/m. Ideal body weight: 57 kg (125 lb 10.6 oz) Adjusted ideal body weight: 81.4 kg (179 lb 6.4 oz)   BMI Readings from Last 4 Encounters:  02/14/18 43.27 kg/m  01/18/18 43.93 kg/m  12/21/17 44.93 kg/m  10/18/17 44.43 kg/m   Wt Readings from Last 4 Encounters:  02/14/18 260 lb (117.9 kg)  01/18/18 264 lb (119.7 kg)  12/21/17 270 lb (122.5 kg)  10/18/17 267 lb (121.1 kg)

## 2018-02-14 NOTE — Progress Notes (Signed)
[name]  MRN: [id number]  Referring Provider: Glendon Axe, MD  DOB: 06-06-1945  PCP: Glendon Axe, MD  DOS: 02/14/2018  Note by: Vevelyn Francois NP  Service setting: Ambulatory outpatient  Specialty: Interventional Pain Management  Location: [facility] (AMB) Pain Management Facility    Patient type: Established    Primary Reason(s) for Visit: Encounter for prescription drug management. (Level of risk: moderate)  CC: Foot Pain (bilateral); Hand Pain (bilateral); and Back Pain  HPI  [name] is a 73 y.o. year old, female patient, who comes today for a medication management evaluation. She has CKD (chronic kidney disease) stage 3, GFR 30-59 ml/min ([place]); Depressive disorder; Essential (primary) hypertension; GERD (gastroesophageal reflux disease); Hypothyroidism; Hypercholesterolemia; Extreme obesity ([place]); Insomnia secondary to chronic pain; Congestive heart failure with left ventricular systolic dysfunction ([place]); Type 2 diabetes mellitus ([place]); Morbid obesity ([place]); Seronegative rheumatoid arthritis ([place]); Chronic knee pain (Right); Opiate use (30 MME/Day); Long term current use of opiate analgesic; Long term prescription opiate use; Encounter for therapeutic drug level monitoring; Opioid dependence ([place]); Lumbar spinal stenosis (5 mm Severe L3-4; 8 mm L4-5); Lumbar spondylosis; Lumbar facet syndrome (Location of Primary Source of Pain) (Bilateral) (L>R); Chronic pain syndrome; Chronic low back pain (Location of Primary Source of Pain) (Bilateral) (L>R); History of TKR (total knee replacement) (Left); History of femur fracture (Right); 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); 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); Lumbar foraminal stenosis (Bilateral L3-4 and L5-S1); Chronic hip pain (Left); Disturbance of skin sensation; Elevated sedimentation rate; Elevated C-reactive protein (CRP); GAD (generalized anxiety disorder); Anxiety; Diet-controlled diabetes mellitus ([place]);  Osteoarthritis of knee (Right); Opioid-induced constipation ([place]); Arthritis; Dyspnea on exertion; Kidney disease; Primary fibromyalgia syndrome; Snoring; Morbid obesity with BMI of 40.0-44.9, adult ([place]); Chronic arthralgias of knees and hips (Right); Postmenopausal; and Body mass index (BMI) of 40.0-44.9 in adult [facility]) on their problem list. Her primarily concern today is the Foot Pain (bilateral); Hand Pain (bilateral); and Back Pain  Pain Assessment: Location: Right, Left Foot Radiating: radiates down both legs, worse on the right, hand pain radiates up into wrist and forearm.  Onset: More than a month ago Duration: Chronic pain Quality: Sharp, Discomfort, Constant Severity: 4 /10 (subjective, self-reported pain score)  Note: Reported level is compatible with observation.                          Effect on ADL: RA if very limiting  Timing: Constant Modifying factors: aspere cream pad, medication, special gloves for RA BP: 118/62  HR: 91  [name] was last scheduled for an appointment on 01/18/2018 for medication management. During today's appointment we reviewed [name]'s chronic pain status, as well as her outpatient medication regimen.  She is having a flareup with RA in her hands.  She was seen by rheumatologist Dr. Jefm Bryant and started on steroids.  She has overused her medications secondary to the flareup.  The patient  reports no history of drug use. Her body mass index is 43.27 kg/m.  Further details on both, my assessment(s), as well as the proposed treatment plan, please see below.  Controlled Substance Pharmacotherapy Assessment REMS (Risk Evaluation and Mitigation Strategy)  Analgesic:Oxycodone IR 5 mg every 6 hours (20 mg/day) MME/day:30 mg/day Janett Billow, RN  02/14/2018  2:32 PM  Sign when Signing Visit Nursing Pain Medication Assessment:  Safety precautions to be maintained throughout the outpatient stay will include: orient to surroundings, keep bed in  low position,  maintain call bell within reach at all times, provide assistance with transfer out of bed and ambulation.  Medication Inspection Compliance: Pill count conducted under aseptic conditions, in front of the patient. Neither the pills nor the bottle was removed from the patient's sight at any time. Once count was completed pills were immediately returned to the patient in their original bottle.  Medication: Oxycodone IR Pill/Patch Count: 1 of 120 pills remain Pill/Patch Appearance: Markings consistent with prescribed medication Bottle Appearance: Standard pharmacy container. Clearly labeled. Filled Date: 01 / 09 / 2020 Last Medication intake:  Yesterday   Pharmacokinetics: Liberation and absorption (onset of action): WNL Distribution (time to peak effect): WNL Metabolism and excretion (duration of action): WNL         Pharmacodynamics: Desired effects: Analgesia: [name] reports >50% benefit. Functional ability: Patient reports that medication allows her to accomplish basic ADLs Clinically meaningful improvement in function (CMIF): Sustained CMIF goals met Perceived effectiveness: Described as relatively effective, allowing for increase in activities of daily living (ADL) Undesirable effects: Side-effects or Adverse reactions: None reported Monitoring: [place] PMP: Online review of the past 50-monthperiod conducted. Compliant with practice rules and regulations Last UDS on record: Summary  Date Value Ref Range Status  10/18/2017 FINAL  Final    Comment:    ==================================================================== TOXASSURE SELECT 13 (MW) ==================================================================== Test                             Result       Flag       Units Drug Present not Declared for Prescription Verification   Tramadol                       >2370        UNEXPECTED ng/mg creat   O-Desmethyltramadol            1950         UNEXPECTED ng/mg creat    N-Desmethyltramadol            >2370        UNEXPECTED ng/mg creat    Source of tramadol is a prescription medication.    O-desmethyltramadol and N-desmethyltramadol are expected    metabolites of tramadol. Drug Absent but Declared for Prescription Verification   Oxycodone                      Not Detected UNEXPECTED ng/mg creat ==================================================================== Test                      Result    Flag   Units      Ref Range   Creatinine              211              mg/dL      >=20 ==================================================================== Declared Medications:  The flagging and interpretation on this report are based on the  following declared medications.  Unexpected results may arise from  inaccuracies in the declared medications.  **Note: The testing scope of this panel includes these medications:  Oxycodone  **Note: The testing scope of this panel does not include following  reported medications:  Aspirin (Aspirin 81)  Benzonatate  Chlorthalidone  Docusate  Duloxetine  Esomeprazole  Fluticasone  Folic acid  Furosemide  Hydroxychloroquine  Infliximab  Levothyroxine  Loratadine  Methotrexate  Metoprolol  Quetiapine  Simvastatin  Spironolactone  Umeclidinium ==================================================================== For clinical consultation, please call [phone]. ====================================================================    UDS interpretation: Compliant          Medication Assessment Form: Reviewed. Patient indicates being compliant with therapy Treatment compliance: Deficiencies noted and steps taken to remind the patient of the seriousness of adequate therapy compliance Risk Assessment Profile: Aberrant behavior: See prior evaluations. None observed or detected today Comorbid factors increasing risk of overdose: caucasian and kidney disease Risk of substance use disorder (SUD):  Moderate-to-High  ORT Scoring interpretation table:  Score <3 = Low Risk for SUD  Score between 4-7 = Moderate Risk for SUD  Score >8 = High Risk for Opioid Abuse   Risk Mitigation Strategies:  Patient Counseling: Covered Patient-Prescriber Agreement (PPA): Present and active  Notification to other healthcare providers: Done  Pharmacologic Plan: Therapy adjustment:           Monthly monitoring  Laboratory Chemistry  Inflammation Markers (CRP: Acute Phase) (ESR: Chronic Phase)                        Rheumatology Markers No results found for: RF, ANA, LABURIC, URICUR, LYMEIGGIGMAB, LYMEABIGMQN, HLAB27                      Renal Function Markers Lab Results  Component Value Date   BUN 36 (H) 05/23/2017   CREATININE 1.63 (H) 05/23/2017   GFRAA 35 (L) 05/23/2017   GFRNONAA 30 (L) 05/23/2017                             Hepatic Function Markers Lab Results  Component Value Date   AST 19 07/15/2015   ALT 15 07/15/2015   ALBUMIN 4.0 07/15/2015   ALKPHOS 73 07/15/2015                        Electrolytes Lab Results  Component Value Date   NA 134 (L) 05/23/2017   K 2.8 (L) 05/23/2017   CL 87 (L) 05/23/2017   CALCIUM 9.1 05/23/2017   MG 2.1 07/15/2015                        Neuropathy Markers Lab Results  Component Value Date   VITAMINB12 610 07/15/2015                        CNS Tests No results found for: COLORCSF, APPEARCSF, RBCCOUNTCSF, WBCCSF, POLYSCSF, LYMPHSCSF, EOSCSF, PROTEINCSF, GLUCCSF, JCVIRUS, CSFOLI, IGGCSF                      Bone Pathology Markers Lab Results  Component Value Date   25OHVITD1 36 07/15/2015   25OHVITD2 3.6 07/15/2015   25OHVITD3 32 07/15/2015                         Coagulation Parameters Lab Results  Component Value Date   PLT 305 05/23/2017                        Cardiovascular Markers Lab Results  Component Value Date   BNP 42.0 05/23/2017   TROPONINI <0.03 05/23/2017   HGB 14.7 05/23/2017   HCT 43.0 05/23/2017  CA Markers No results found for: CEA, CA125, LABCA2                      Endocrine Markers No results found for: TSH, FREET4, TESTOFREE, TESTOSTERONE, ESTRADIOL, ESTRADIOLPCT, ESTRADIOLFRE                      Note: Lab results reviewed.  Care everywhere updated January 2020  Recent Diagnostic Imaging Results  DG Chest Port 1 View CLINICAL DATA:  c/o congestion, weakness since Saturday.  EXAM: PORTABLE CHEST 1 VIEW  COMPARISON:  None.  FINDINGS: Borderline cardiomegaly. Lungs are clear. No pleural effusion or pneumothorax seen. No acute or suspicious osseous finding.  IMPRESSION: No active disease. No evidence of pneumonia or pulmonary edema. Borderline cardiomegaly.  Electronically Signed   By: Franki Cabot M.D.   On: 05/23/2017 23:52  Complexity Note: Imaging results reviewed. Results shared with Ms. Biernat, using Layman's terms.                         Meds   Current Outpatient Medications:  .  aspirin EC 81 MG tablet, Take by mouth., Disp: , Rfl:  .  chlorthalidone (HYGROTON) 25 MG tablet, TAKE 1/2 TABLET (12.5MG) BY MOUTH EVERY DAY, Disp: , Rfl: 11 .  Cholecalciferol (VITAMIN D) 2000 UNITS tablet, Take by mouth daily. , Disp: , Rfl:  .  docusate sodium (COLACE) 100 MG capsule, Take 100 mg by mouth 2 (two) times daily., Disp: , Rfl:  .  DULoxetine (CYMBALTA) 30 MG capsule, Two tablets in the morning, and one in the evening., Disp: 270 capsule, Rfl: 0 .  esomeprazole (NEXIUM) 20 MG capsule, Take by mouth daily. , Disp: , Rfl:  .  Fluticasone-Umeclidin-Vilant (TRELEGY ELLIPTA) 100-62.5-25 MCG/INH AEPB, Inhale into the lungs., Disp: , Rfl:  .  folic acid (FOLVITE) 1 MG tablet, Take 1 mg by mouth daily., Disp: , Rfl:  .  furosemide (LASIX) 40 MG tablet, TAKE 1 TABLET ONCE DAILY, Disp: , Rfl:  .  hydroxychloroquine (PLAQUENIL) 200 MG tablet, Take 200 mg by mouth daily. , Disp: , Rfl:  .  inFLIXimab (REMICADE) 100 MG injection, Inject 100 mg into  the vein every 8 (eight) weeks., Disp: , Rfl:  .  levothyroxine (SYNTHROID, LEVOTHROID) 200 MCG tablet, TAKE 1 TABLET ONCE DAILY- ON AN EMPTY STOMACH WITH A GLASS OF WATER 30-60 MINUTES BEFORE BREAKFAST, Disp: , Rfl: 5 .  loratadine (CLARITIN) 10 MG tablet, Take by mouth daily as needed. , Disp: , Rfl:  .  methotrexate (RHEUMATREX) 2.5 MG tablet, TAKE 4 TABLETS (10 MG TOTAL) BY MOUTH EVERY 7 (SEVEN) DAYS WITH A MEAL, Disp: , Rfl: 5 .  metoprolol succinate (TOPROL-XL) 50 MG 24 hr tablet, TAKE 1 TABLET (50 MG TOTAL) BY MOUTH ONCE DAILY., Disp: , Rfl: 1 .  Multiple Vitamin (MULTI-VITAMINS) TABS, Take by mouth., Disp: , Rfl:  .  oxyCODONE (OXY IR/ROXICODONE) 5 MG immediate release tablet, Take 1 tablet (5 mg total) by mouth every 6 (six) hours as needed for severe pain., Disp: 120 tablet, Rfl: 0 .  QUEtiapine (SEROQUEL) 200 MG tablet, Take 1 tablet (200 mg total) by mouth at bedtime., Disp: 90 tablet, Rfl: 0 .  QUEtiapine (SEROQUEL) 50 MG tablet, Take 1 tablet (50 mg total) by mouth 2 (two) times daily., Disp: 180 tablet, Rfl: 0 .  simvastatin (ZOCOR) 10 MG tablet, Take 10 mg by mouth daily., Disp: , Rfl:  .  Spacer/Aero Chamber Mouthpiece MISC, 1 Units by Does not apply route every 4 (four) hours as needed (wheezing)., Disp: 1 each, Rfl: 0 .  spironolactone (ALDACTONE) 25 MG tablet, TAKE 1/2 (ONE-HALF) TABLET BY MOUTH DAILY, Disp: , Rfl: 11 .  UNABLE TO FIND, cholecalciferol (vitamin D3) 125 mcg (5,000 unit) capsule  TAKE 1 CAPSULE DAILY, Disp: , Rfl:  .  UNABLE TO FIND, duloxetine 60 mg capsule,delayed release, Disp: , Rfl:  .  oxyCODONE (OXY IR/ROXICODONE) 5 MG immediate release tablet, Take 1 tablet (5 mg total) by mouth every 6 (six) hours as needed for severe pain., Disp: 120 tablet, Rfl: 0 .  [START ON 02/17/2018] oxyCODONE (OXY IR/ROXICODONE) 5 MG immediate release tablet, Take 1 tablet (5 mg total) by mouth every 6 (six) hours as needed for up to 30 days for severe pain., Disp: 120 tablet, Rfl:  0  ROS  Constitutional: Denies any fever or chills Gastrointestinal: No reported hemesis, hematochezia, vomiting, or acute GI distress Musculoskeletal: Denies any acute onset joint swelling, redness, loss of ROM, or weakness Neurological: No reported episodes of acute onset apraxia, aphasia, dysarthria, agnosia, amnesia, paralysis, loss of coordination, or loss of consciousness  Allergies  Ms. Saulters is allergic to bupropion and penicillins.  PFSH  Drug: Ms. Kimbell  reports no history of drug use. Alcohol:  reports no history of alcohol use. Tobacco:  reports that she has never smoked. She has never used smokeless tobacco. Medical:  has a past medical history of Allergy, Anxiety, Arthritis, degenerative (10/08/2013), Chronic kidney disease, Depression, Lumbar spinal stenosis with neurogenic claudication (11/07/2014), Major depression, single episode, in complete remission ([place]) (06/25/2015), Memory loss, short term (03/19/2014), Seizure ([place]) (10/07/2014), Sleep apnea, and Thyroid disease. Surgical: Ms. Montesinos  has a past surgical history that includes Abdominal hysterectomy; Cesarean section; Replacement total knee (Left); and Hip surgery (Right). Family: family history includes Alcohol abuse in her father; Depression in her father and sister; Heart attack in her father; Hypertension in her mother and sister; Post-traumatic stress disorder in her father; Rheum arthritis in her sister; Stroke in her mother.  Constitutional Exam  General appearance: Well nourished, well developed, and well hydrated. In no apparent acute distress Vitals:   02/14/18 1425  BP: 118/62  Pulse: 91  Resp: 18  Temp: 98.3 F (36.8 C)  TempSrc: Oral  SpO2: 100%  Weight: 260 lb (117.9 kg)  Height: '5\' 5"'  (1.651 m)   BMI Assessment: Estimated body mass index is 43.27 kg/m as calculated from the following:   Height as of this encounter: '5\' 5"'  (1.651 m).   Weight as of this encounter: 260 lb (117.9  kg). Psych/Mental status: Alert, oriented x 3 (person, place, & time)       Eyes: PERLA Respiratory: No evidence of acute respiratory distress  Cervical Spine Area Exam  Skin & Axial Inspection: No masses, redness, edema, swelling, or associated skin lesions Alignment: Symmetrical Functional ROM: Unrestricted ROM      Stability: No instability detected Muscle Tone/Strength: Functionally intact. No obvious neuro-muscular anomalies detected. Sensory (Neurological): Unimpaired Palpation: No palpable anomalies              Upper Extremity (UE) Exam    Side: Right upper extremity  Side: Left upper extremity  Skin & Extremity Inspection: Bouchard's nodes (PIP)  Skin & Extremity Inspection: Bouchard's nodes (PIP)  Functional ROM: Unrestricted ROM          Functional ROM: Unrestricted ROM  Muscle Tone/Strength: Functionally intact. No obvious neuro-muscular anomalies detected.  Muscle Tone/Strength: Functionally intact. No obvious neuro-muscular anomalies detected.  Sensory (Neurological): Unimpaired          Sensory (Neurological): Unimpaired          Palpation: No palpable anomalies              Palpation: No palpable anomalies                   Thoracic Spine Area Exam  Skin & Axial Inspection: No masses, redness, or swelling Alignment: Symmetrical Functional ROM: Unrestricted ROM Stability: No instability detected Muscle Tone/Strength: Functionally intact. No obvious neuro-muscular anomalies detected. Sensory (Neurological): Unimpaired Muscle strength & Tone: No palpable anomalies  Lumbar Spine Area Exam  Skin & Axial Inspection: No masses, redness, or swelling Alignment: Symmetrical Functional ROM: Unrestricted ROM       Stability: No instability detected Muscle Tone/Strength: Functionally intact. No obvious neuro-muscular anomalies detected. Sensory (Neurological): Unimpaired Palpation: No palpable anomalies       Provocative Tests: Hyperextension/rotation test:  deferred today       Lumbar quadrant test (Kemp's test): deferred today       Lateral bending test: deferred today       Patrick's Maneuver: deferred today                    Gait & Posture Assessment  Ambulation: Unassisted Gait: Relatively normal for age and body habitus Posture: WNL   Lower Extremity Exam    Side: Right lower extremity  Side: Left lower extremity  Stability: No instability observed          Stability: No instability observed          Skin & Extremity Inspection: Skin color, temperature, and hair growth are WNL. No peripheral edema or cyanosis. No masses, redness, swelling, asymmetry, or associated skin lesions. No contractures.  Skin & Extremity Inspection: Skin color, temperature, and hair growth are WNL. No peripheral edema or cyanosis. No masses, redness, swelling, asymmetry, or associated skin lesions. No contractures.  Functional ROM: Unrestricted ROM                  Functional ROM: Unrestricted ROM                  Muscle Tone/Strength: Functionally intact. No obvious neuro-muscular anomalies detected.  Muscle Tone/Strength: Functionally intact. No obvious neuro-muscular anomalies detected.  Sensory (Neurological): Unimpaired        Sensory (Neurological): Unimpaired            Palpation: No palpable anomalies  Palpation: No palpable anomalies   Assessment  Primary Diagnosis & Pertinent Problem List: The primary encounter diagnosis was Lumbar spondylosis. Diagnoses of Seronegative rheumatoid arthritis ([place]), Chronic pain syndrome, and Body mass index (BMI) of 40.0-44.9 in adult [facility]) were also pertinent to this visit.  Status Diagnosis  Controlled Having a Flare-up Controlled 1. Lumbar spondylosis   2. Seronegative rheumatoid arthritis ([place])   3. Chronic pain syndrome   4. Body mass index (BMI) of 40.0-44.9 in adult [facility])     Problems updated and reviewed during this visit: No problems updated. Plan of Care  Pharmacotherapy (Medications Ordered): Meds  ordered this encounter  Medications  . oxyCODONE (OXY IR/ROXICODONE) 5 MG immediate release tablet    Sig: Take 1 tablet (5 mg total) by mouth every 6 (six) hours as needed for up to 30 days for severe pain.    Dispense:  120 tablet    Refill:  0    Do not add this medication to the electronic "Automatic Refill" notification system. Patient may have prescription filled one day early if pharmacy is closed on scheduled refill date.    Order Specific Question:   Supervising Provider    Answer:   Milinda Pointer [[id number]]   New Prescriptions   No medications on file   Medications administered today: Jerianne Anselmo had no medications administered during this visit. Lab-work, procedure(s), and/or referral(s): No orders of the defined types were placed in this encounter.  Imaging and/or referral(s): None  Interventional therapies: Planned, scheduled, and/or pending:  Not at this time.   Considering:  Therapeutic series of 5 Hyalgan right knee injections  Diagnostic intra-articular left hip joint injection  Possible left hip radiofrequency ablation  Diagnostic right intra-articular knee joint injection with local anesthetic and steroid  Diagnostic bilateral genicular nerve block  Possible bilateral genicular nerve radiofrequency ablation  Diagnostic bilateral lumbar facet block  Possible bilateral lumbar facet radiofrequency ablation  Diagnostic bilateral L3-4 and/or L5-S1 transforaminal epidural steroid injections  Diagnostic L3-4 versus L4-5 lumbar epidural steroid injection    Palliative PRN treatment(s):  Therapeutic series of 5 Hyalgan right knee injections Diagnostic intra-articular left hip joint injection  Diagnostic right intra-articular knee joint injection with local anesthetic and steroid  Diagnostic bilateral genicular nerve block  Diagnostic bilateral lumbar facet block  Diagnostic bilateral L3-4 and/or L5-S1 transforaminal epidural steroid injections   Diagnostic L3-4 versus L4-5 lumbar epidural steroid injection    Provider-requested follow-up: Return in about 4 weeks (around 03/14/2018) for MedMgmt.  Future Appointments  Date Time Provider [place]  02/15/2018  2:00 PM [facility]  03/14/2018  2:30 PM Vevelyn Francois, NP [facility] None  04/04/2018  1:45 PM [facility] None  04/24/2018  3:45 PM Allyne Gee, MD [place] None   Primary Care Physician: Glendon Axe, MD Location: [facility] Outpatient Pain Management Facility Note by: Vevelyn Francois NP Date: 02/14/2018; Time: 10:19 AM  Pain Score Disclaimer: We use the NRS-11 scale. This is a self-reported, subjective measurement of pain severity with only modest accuracy. It is used primarily to identify changes within a particular patient. It must be understood that outpatient pain scales are significantly less accurate that those used for research, where they can be applied under ideal controlled circumstances with minimal exposure to variables. In reality, the score is likely to be a combination of pain intensity and pain affect, where pain affect describes the degree of emotional arousal or changes in action readiness caused by the sensory experience of pain. Factors such as social and work situation, setting, emotional state, anxiety levels, expectation, and prior pain experience may influence pain perception and show large inter-individual differences that may also be affected by time variables.  Patient instructions provided during this appointment: Patient Instructions   ____________________________________________________________________________________________  Medication Rules  Purpose: To inform patients, and their family members, of our rules and regulations.  Applies to: All patients receiving prescriptions (written or electronic).  Pharmacy of record: Pharmacy where electronic prescriptions will be sent. If written prescriptions are taken to  a different pharmacy, please inform the nursing staff. The pharmacy listed in the electronic medical record should be the one where you would like electronic prescriptions to be sent.  Electronic prescriptions: In compliance with the [place] (STOP) Act of 2017 (Session Lanny Cramp [id number]), effective January 10, 2018, all controlled substances must be electronically  prescribed. Calling prescriptions to the pharmacy will cease to exist.  Prescription refills: Only during scheduled appointments. Applies to all prescriptions.  NOTE: The following applies primarily to controlled substances (Opioid* Pain Medications).   Patient's responsibilities: 1. Pain Pills: Bring all pain pills to every appointment (except for procedure appointments). 2. Pill Bottles: Bring pills in original pharmacy bottle. Always bring the newest bottle. Bring bottle, even if empty. 3. Medication refills: You are responsible for knowing and keeping track of what medications you take and those you need refilled. The day before your appointment: write a list of all prescriptions that need to be refilled. The day of the appointment: give the list to the admitting nurse. Prescriptions will be written only during appointments. If you forget a medication: it will not be "Called in", "Faxed", or "electronically sent". You will need to get another appointment to get these prescribed. No early refills. Do not call asking to have your prescription filled early. 4. Prescription Accuracy: You are responsible for carefully inspecting your prescriptions before leaving our office. Have the discharge nurse carefully go over each prescription with you, before taking them home. Make sure that your name is accurately spelled, that your address is correct. Check the name and dose of your medication to make sure it is accurate. Check the number of pills, and the written instructions to make sure they are clear and  accurate. Make sure that you are given enough medication to last until your next medication refill appointment. 5. Taking Medication: Take medication as prescribed. When it comes to controlled substances, taking less pills or less frequently than prescribed is permitted and encouraged. Never take more pills than instructed. Never take medication more frequently than prescribed.  6. Inform other Doctors: Always inform, all of your healthcare providers, of all the medications you take. 7. Pain Medication from other Providers: You are not allowed to accept any additional pain medication from any other Doctor or Healthcare provider. There are two exceptions to this rule. (see below) In the event that you require additional pain medication, you are responsible for notifying us, as stated below. 8. Medication Agreement: You are responsible for carefully reading and following our Medication Agreement. This must be signed before receiving any prescriptions from our practice. Safely store a copy of your signed Agreement. Violations to the Agreement will result in no further prescriptions. (Additional copies of our Medication Agreement are available upon request.) 9. Laws, Rules, & Regulations: All patients are expected to follow all Federal and [employer], [employer], Rules, [employer]. Ignorance of the Laws does not constitute a valid excuse. The use of any illegal substances is prohibited. 10. Adopted CDC guidelines & recommendations: Target dosing levels will be at or below 60 MME/day. Use of benzodiazepines** is not recommended.  Exceptions: There are only two exceptions to the rule of not receiving pain medications from other Healthcare Providers. 1. Exception #1 (Emergencies): In the event of an emergency (i.e.: accident requiring emergency care), you are allowed to receive additional pain medication. However, you are responsible for: As soon as you are able, call our office [phone], at any time of  the day or night, and leave a message stating your name, the date and nature of the emergency, and the name and dose of the medication prescribed. In the event that your call is answered by a member of our staff, make sure to document and save the date, time, and the name of the person that took your information.  2.  Exception #2 (Planned Surgery): In the event that you are scheduled by another doctor or dentist to have any type of surgery or procedure, you are allowed (for a period no longer than 30 days), to receive additional pain medication, for the acute post-op pain. However, in this case, you are responsible for picking up a copy of our "Post-op Pain Management for Surgeons" handout, and giving it to your surgeon or dentist. This document is available at our office, and does not require an appointment to obtain it. Simply go to our office during business hours (Monday-Thursday from 8:00 AM to 4:00 PM) (Friday 8:00 AM to 12:00 Noon) or if you have a scheduled appointment with [place], prior to your surgery, and ask for it by name. In addition, you will need to provide [place] with your name, name of your surgeon, type of surgery, and date of procedure or surgery.  *Opioid medications include: morphine, codeine, oxycodone, oxymorphone, hydrocodone, hydromorphone, meperidine, tramadol, tapentadol, buprenorphine, fentanyl, methadone. **Benzodiazepine medications include: diazepam (Valium), alprazolam (Xanax), clonazepam (Klonopine), lorazepam (Ativan), clorazepate (Tranxene), chlordiazepoxide (Librium), estazolam (Prosom), oxazepam (Serax), temazepam (Restoril), triazolam (Halcion) (Last updated: 03/09/2017) ____________________________________________________________________________________________    BMI interpretation table: BMI level Category Range association with higher incidence of chronic pain  <18 kg/m2 Underweight   18.5-24.9 kg/m2 Ideal body weight   25-29.9 kg/m2 Overweight Increased incidence by  20%  30-34.9 kg/m2 Obese (Class I) Increased incidence by 68%  35-39.9 kg/m2 Severe obesity (Class II) Increased incidence by 136%  >40 kg/m2 Extreme obesity (Class III) Increased incidence by 254%   Patient's current BMI Ideal Body weight  Body mass index is 43.27 kg/m. Ideal body weight: 57 kg (125 lb 10.6 oz) Adjusted ideal body weight: 81.4 kg (179 lb 6.4 oz)   BMI Readings from Last 4 Encounters:  02/14/18 43.27 kg/m  01/18/18 43.93 kg/m  12/21/17 44.93 kg/m  10/18/17 44.43 kg/m   Wt Readings from Last 4 Encounters:  02/14/18 260 lb (117.9 kg)  01/18/18 264 lb (119.7 kg)  12/21/17 270 lb (122.5 kg)  10/18/17 267 lb (121.1 kg)

## 2018-02-14 NOTE — Progress Notes (Signed)
Nursing Pain Medication Assessment:  Safety precautions to be maintained throughout the outpatient stay will include: orient to surroundings, keep bed in low position, maintain call bell within reach at all times, provide assistance with transfer out of bed and ambulation.  Medication Inspection Compliance: Pill count conducted under aseptic conditions, in front of the patient. Neither the pills nor the bottle was removed from the patient's sight at any time. Once count was completed pills were immediately returned to the patient in their original bottle.  Medication: Oxycodone IR Pill/Patch Count: 1 of 120 pills remain Pill/Patch Appearance: Markings consistent with prescribed medication Bottle Appearance: Standard pharmacy container. Clearly labeled. Filled Date: 01 / 09 / 2020 Last Medication intake:  Yesterday

## 2018-02-15 ENCOUNTER — Encounter: Payer: PRIVATE HEALTH INSURANCE | Admitting: Nurse Practitioner

## 2018-02-23 ENCOUNTER — Ambulatory Visit
Admission: RE | Admit: 2018-02-23 | Discharge: 2018-02-23 | Disposition: A | Discharge status: Home / Self Care | Payer: Medicare Other | Source: Ambulatory Visit | Attending: Internal Medicine | Admitting: Internal Medicine

## 2018-02-23 DIAGNOSIS — Z1231 Encounter for screening mammogram for malignant neoplasm of breast: Secondary | ICD-10-CM | POA: Diagnosis not present

## 2018-02-23 IMAGING — MG DIGITAL SCREENING BILATERAL MAMMOGRAM WITH TOMO AND CAD
8 of 14 series · 8 of 40 positions shown · non-contrast
Comparison: Previous exam(s).

CLINICAL DATA: Screening.

EXAM:
DIGITAL SCREENING BILATERAL MAMMOGRAM WITH TOMO AND CAD

[R CC synth-2D]
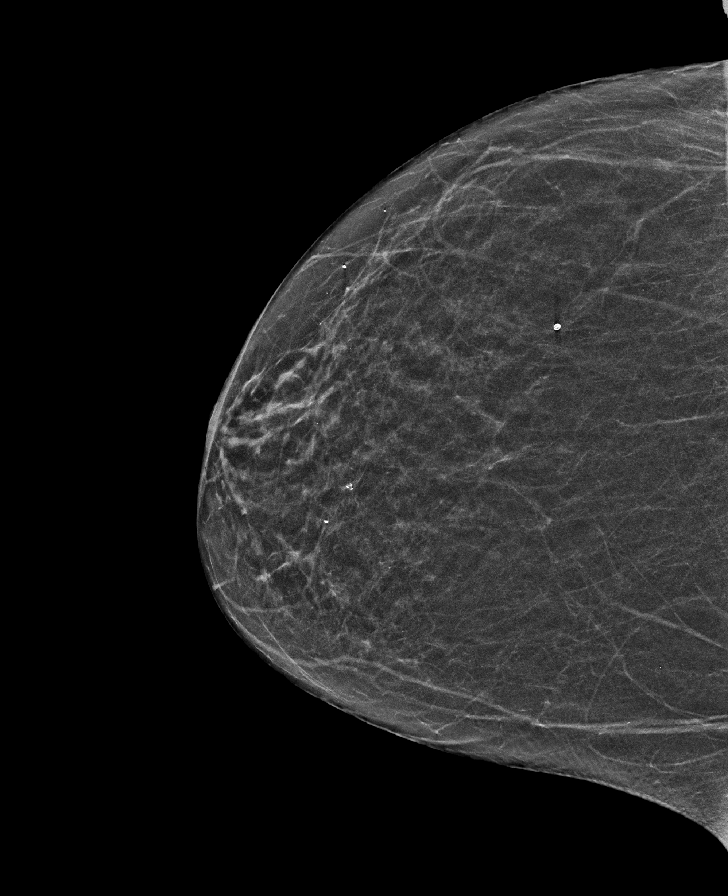

[L MLO synth-2D (1 of 2)]
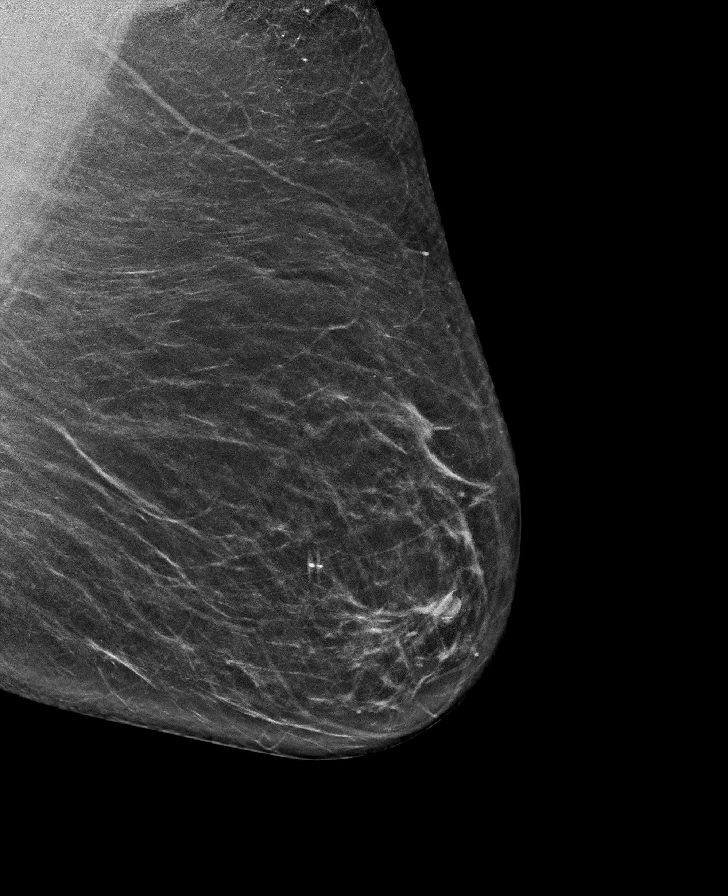

[R MLO synth-2D (1 of 2)]
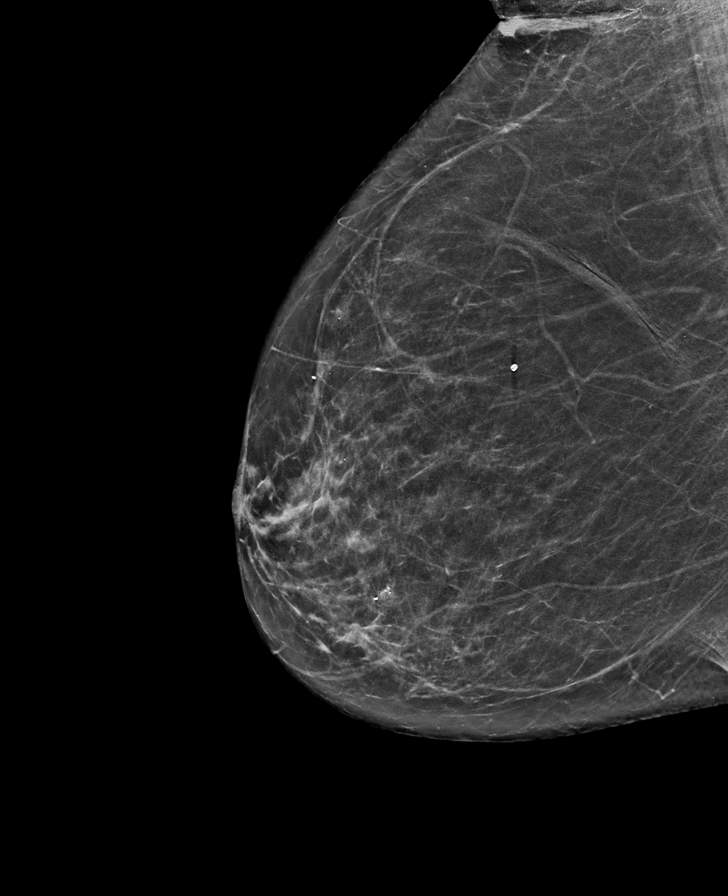

[L XCCL synth-2D]
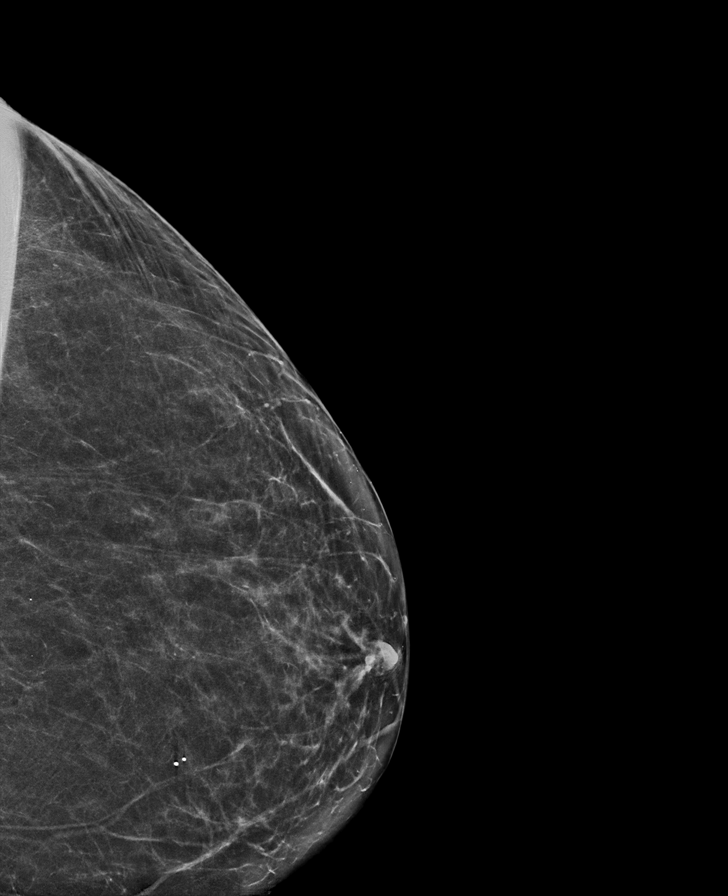

[R MLO synth-2D (2 of 2)]
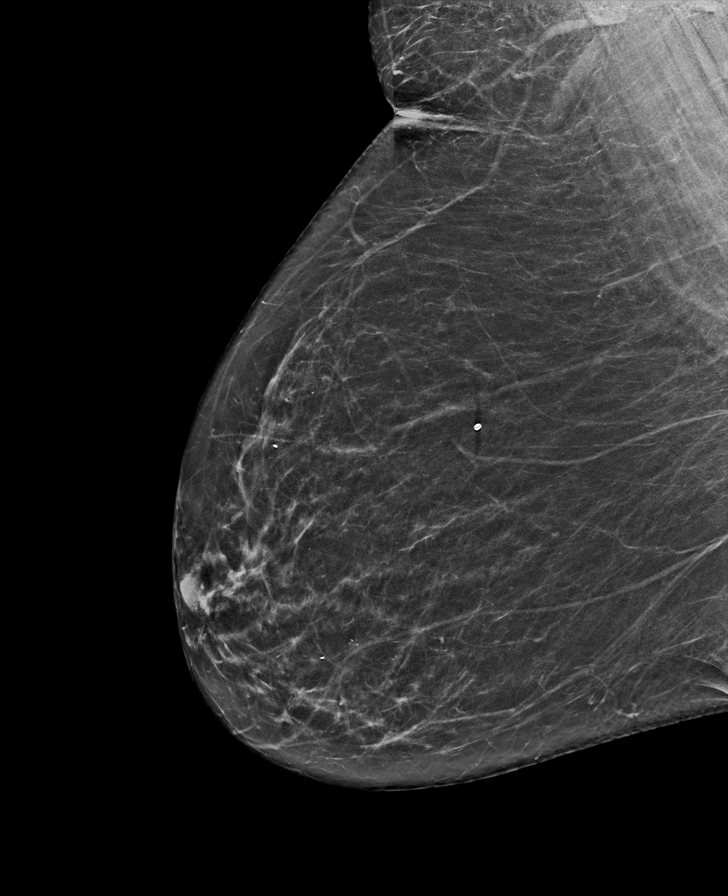

[L CC synth-2D]
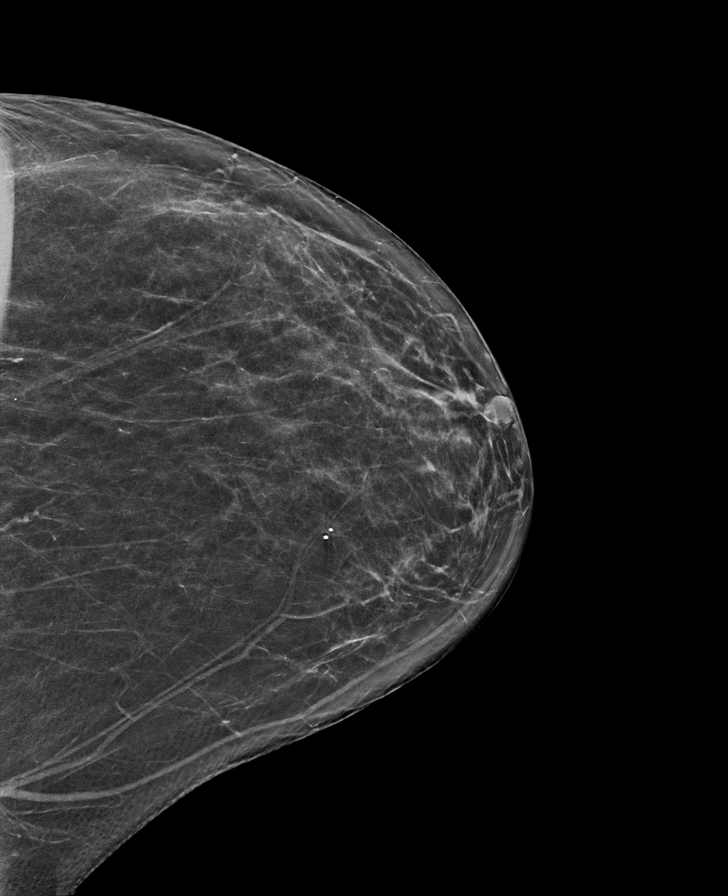

[L MLO synth-2D (2 of 2)]
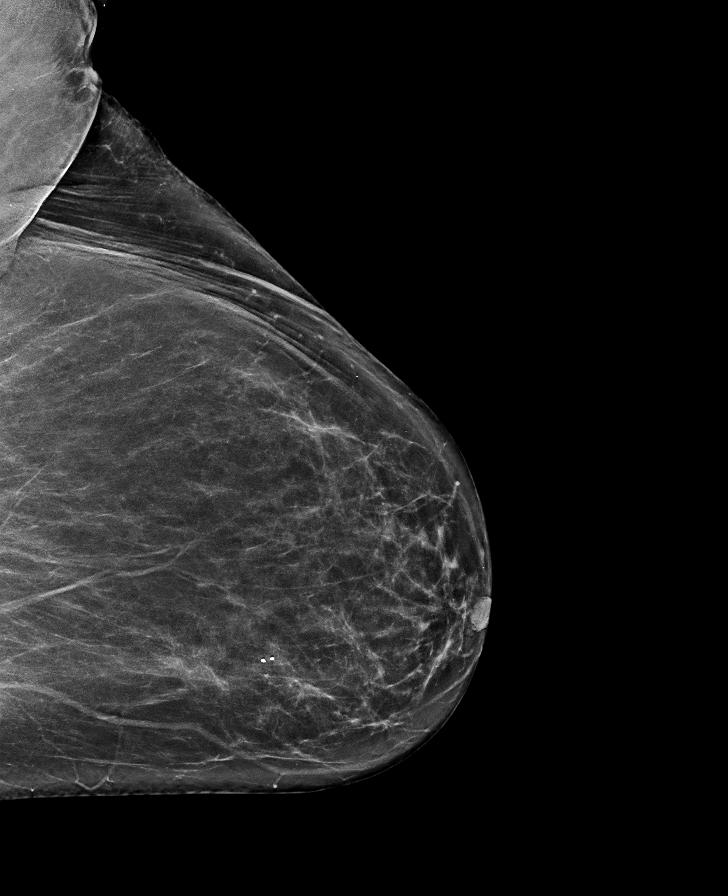

[L MLO tomo · tomo slice 39/77.0]
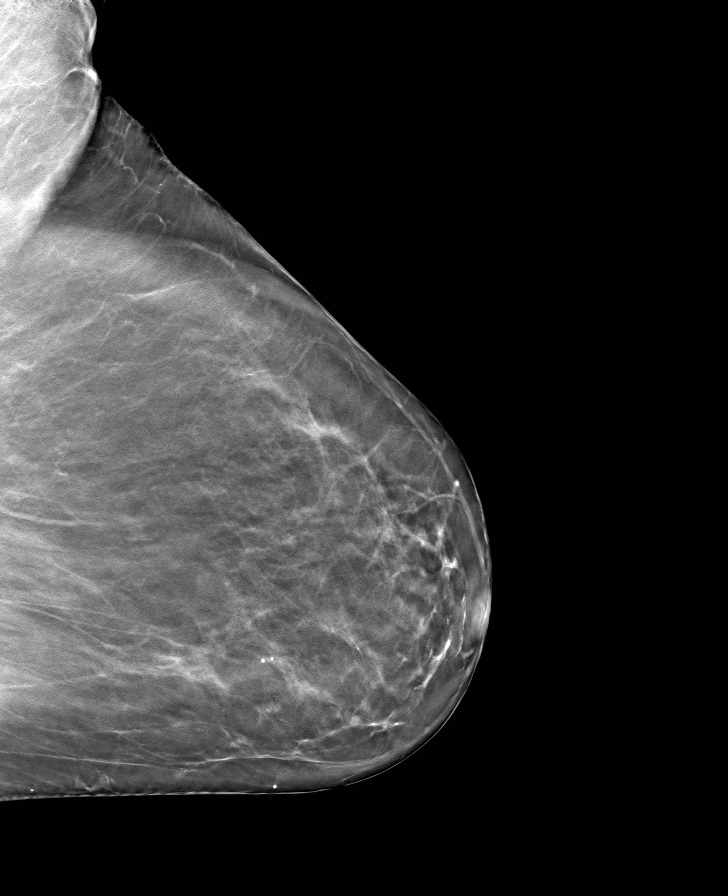

[8 of 40 positions shown; findings below may reference images not displayed]

ACR Breast Density Category b: There are scattered areas of
fibroglandular density.
FINDINGS: There are no findings suspicious for malignancy. Images were
processed with CAD.
IMPRESSION: No mammographic evidence of malignancy. A result letter of this
screening mammogram will be mailed directly to the patient.

RECOMMENDATION:
Screening mammogram in one year. (Code:[TQ])

BI-RADS CATEGORY  1: Negative.

## 2018-03-14 ENCOUNTER — Encounter: Payer: Self-pay | Admitting: Nurse Practitioner

## 2018-03-14 ENCOUNTER — Other Ambulatory Visit: Payer: Self-pay

## 2018-03-14 ENCOUNTER — Ambulatory Visit: Payer: Medicare Other | Attending: Nurse Practitioner | Admitting: Nurse Practitioner

## 2018-03-14 VITALS — BP 120/73 | HR 75 | Temp 98.0°F | Resp 18 | Ht 65.0 in | Wt 265.0 lb

## 2018-03-14 DIAGNOSIS — M25552 Pain in left hip: Secondary | ICD-10-CM | POA: Diagnosis not present

## 2018-03-14 DIAGNOSIS — M47816 Spondylosis without myelopathy or radiculopathy, lumbar region: Secondary | ICD-10-CM | POA: Insufficient documentation

## 2018-03-14 DIAGNOSIS — Z79891 Long term (current) use of opiate analgesic: Secondary | ICD-10-CM

## 2018-03-14 DIAGNOSIS — G894 Chronic pain syndrome: Secondary | ICD-10-CM | POA: Diagnosis present

## 2018-03-14 DIAGNOSIS — M0600 Rheumatoid arthritis without rheumatoid factor, unspecified site: Secondary | ICD-10-CM | POA: Insufficient documentation

## 2018-03-14 DIAGNOSIS — G8929 Other chronic pain: Secondary | ICD-10-CM | POA: Insufficient documentation

## 2018-03-14 MED ORDER — OXYCODONE HCL 5 MG PO TABS: 5.0000 mg | ORAL_TABLET | Freq: Four times a day (QID) | ORAL | 0 refills | Status: DC | PRN

## 2018-03-14 NOTE — Progress Notes (Signed)
Nursing Pain Medication Assessment:  Safety precautions to be maintained throughout the outpatient stay will include: orient to surroundings, keep bed in low position, maintain call bell within reach at all times, provide assistance with transfer out of bed and ambulation.  Medication Inspection Compliance: Pill count conducted under aseptic conditions, in front of the patient. Neither the pills nor the bottle was removed from the patient's sight at any time. Once count was completed pills were immediately returned to the patient in their original bottle.  Medication: oxycodone 5 mg Pill/Patch Count: 6 of 120 pills remain Pill/Patch Appearance: Markings consistent with prescribed medication Bottle Appearance: Standard pharmacy container. Clearly labeled. Filled Date: 2 / 7 / 2020 Last Medication intake:  Yesterday

## 2018-03-14 NOTE — Patient Instructions (Addendum)
____________________________________________________________________________________________  Medication Rules  Purpose: To inform patients, and their family members, of our rules and regulations.  Applies to: All patients receiving prescriptions (written or electronic).  Pharmacy of record: Pharmacy where electronic prescriptions will be sent. If written prescriptions are taken to a different pharmacy, please inform the nursing staff. The pharmacy listed in the electronic medical record should be the one where you would like electronic prescriptions to be sent.  Electronic prescriptions: In compliance with the [place] Strengthen Opioid Misuse Prevention (STOP) Act of 2017 (Session Law 2017-74/H243), effective January 10, 2018, all controlled substances must be electronically prescribed. Calling prescriptions to the pharmacy will cease to exist.  Prescription refills: Only during scheduled appointments. Applies to all prescriptions.  NOTE: The following applies primarily to controlled substances (Opioid* Pain Medications).   Patient's responsibilities: 1. Pain Pills: Bring all pain pills to every appointment (except for procedure appointments). 2. Pill Bottles: Bring pills in original pharmacy bottle. Always bring the newest bottle. Bring bottle, even if empty. 3. Medication refills: You are responsible for knowing and keeping track of what medications you take and those you need refilled. The day before your appointment: write a list of all prescriptions that need to be refilled. The day of the appointment: give the list to the admitting nurse. Prescriptions will be written only during appointments. No prescriptions will be written on procedure days. If you forget a medication: it will not be "Called in", "Faxed", or "electronically sent". You will need to get another appointment to get these prescribed. No early refills. Do not call asking to have your prescription filled  early. 4. Prescription Accuracy: You are responsible for carefully inspecting your prescriptions before leaving our office. Have the discharge nurse carefully go over each prescription with you, before taking them home. Make sure that your name is accurately spelled, that your address is correct. Check the name and dose of your medication to make sure it is accurate. Check the number of pills, and the written instructions to make sure they are clear and accurate. Make sure that you are given enough medication to last until your next medication refill appointment. 5. Taking Medication: Take medication as prescribed. When it comes to controlled substances, taking less pills or less frequently than prescribed is permitted and encouraged. Never take more pills than instructed. Never take medication more frequently than prescribed.  6. Inform other Doctors: Always inform, all of your healthcare providers, of all the medications you take. 7. Pain Medication from other Providers: You are not allowed to accept any additional pain medication from any other Doctor or Healthcare provider. There are two exceptions to this rule. (see below) In the event that you require additional pain medication, you are responsible for notifying us, as stated below. 8. Medication Agreement: You are responsible for carefully reading and following our Medication Agreement. This must be signed before receiving any prescriptions from our practice. Safely store a copy of your signed Agreement. Violations to the Agreement will result in no further prescriptions. (Additional copies of our Medication Agreement are available upon request.) 9. Laws, Rules, & Regulations: All patients are expected to follow all Federal and State Laws, Statutes, Rules, & Regulations. Ignorance of the Laws does not constitute a valid excuse. The use of any illegal substances is prohibited. 10. Adopted CDC guidelines & recommendations: Target dosing levels will be  at or below 60 MME/day. Use of benzodiazepines** is not recommended.  Exceptions: There are only two exceptions to the rule of not   receiving pain medications from other Healthcare Providers. 1. Exception #1 (Emergencies): In the event of an emergency (i.e.: accident requiring emergency care), you are allowed to receive additional pain medication. However, you are responsible for: As soon as you are able, call our office [phone], at any time of the day or night, and leave a message stating your name, the date and nature of the emergency, and the name and dose of the medication prescribed. In the event that your call is answered by a member of our staff, make sure to document and save the date, time, and the name of the person that took your information.  2. Exception #2 (Planned Surgery): In the event that you are scheduled by another doctor or dentist to have any type of surgery or procedure, you are allowed (for a period no longer than 30 days), to receive additional pain medication, for the acute post-op pain. However, in this case, you are responsible for picking up a copy of our "Post-op Pain Management for Surgeons" handout, and giving it to your surgeon or dentist. This document is available at our office, and does not require an appointment to obtain it. Simply go to our office during business hours (Monday-Thursday from 8:00 AM to 4:00 PM) (Friday 8:00 AM to 12:00 Noon) or if you have a scheduled appointment with [place], prior to your surgery, and ask for it by name. In addition, you will need to provide [place] with your name, name of your surgeon, type of surgery, and date of procedure or surgery.  *Opioid medications include: morphine, codeine, oxycodone, oxymorphone, hydrocodone, hydromorphone, meperidine, tramadol, tapentadol, buprenorphine, fentanyl, methadone. **Benzodiazepine medications include: diazepam (Valium), alprazolam (Xanax), clonazepam (Klonopine), lorazepam (Ativan), clorazepate  (Tranxene), chlordiazepoxide (Librium), estazolam (Prosom), oxazepam (Serax), temazepam (Restoril), triazolam (Halcion) (Last updated: 03/09/2017) ____________________________________________________________________________________________ Oxycodone to last until 05/18/2018 has been escribed to your pharmacy.

## 2018-03-14 NOTE — Progress Notes (Signed)
[name]  MRN: [id number]  Referring Provider: Glendon Axe, MD  DOB: December 03, 1945  PCP: Glendon Axe, MD  DOS: 03/14/2018  Note by: Dionisio David, NP  Service setting: Ambulatory outpatient  Specialty: Interventional Pain Management  Location: [facility] (AMB) Pain Management Facility    Patient type: Established   HPI  Reason for Visit: Laura [name] is a 72 y.o. year old, female patient, who comes today with a chief complaint of Hand Pain (bilateral); Foot Pain (bilateral); and Back Pain Last Appointment: Her last appointment at our practice was on 02/14/2018. I last saw her on 02/14/2018.  Pain Assessment: Today, [name] describes the severity of the Chronic pain as a 2 /10. She indicates the location/referral of the pain to be Hand Right, Left/ . Onset was: More than a month ago. The quality of pain is described as Aching. Temporal description, or timing of pain is: Constant. Possible modifying factors: aspercreme, meds, special gloves for RA. Ms. Caughlin's  height is _0  (1.651 m) and weight is 265 lb (120.2 kg). Her oral temperature is 98 F (36.7 C). Her blood pressure is 120/73 and her pulse is 75. Her respiration is 18.   Controlled Substance Pharmacotherapy Assessment REMS (Risk Evaluation and Mitigation Strategy)  Analgesic:Oxycodone IR 5 mg every 6 hours (20 mg/day) MME/day:30 mg/day  Rise Patience, RN  03/14/2018  2:59 PM  Signed Nursing Pain Medication Assessment:  Safety precautions to be maintained throughout the outpatient stay will include: orient to surroundings, keep bed in low position, maintain call bell within reach at all times, provide assistance with transfer out of bed and ambulation.  Medication Inspection Compliance: Pill count conducted under aseptic conditions, in front of the patient. Neither the pills nor the bottle was removed from the patient's sight at any time. Once count was completed pills were immediately returned to the patient  in their original bottle.  Medication: oxycodone 5 mg Pill/Patch Count: 6 of 120 pills remain Pill/Patch Appearance: Markings consistent with prescribed medication Bottle Appearance: Standard pharmacy container. Clearly labeled. Filled Date: 2 / 60 / 2020 Last Medication intake:  Yesterday   Pharmacokinetics: Liberation and absorption (onset of action): WNL Distribution (time to peak effect): WNL Metabolism and excretion (duration of action): WNL         Pharmacodynamics: Desired effects: Analgesia: Ms. Howell reports >50% benefit. Functional ability: Patient reports that medication allows her to accomplish basic ADLs Clinically meaningful improvement in function (CMIF): Sustained CMIF goals met Perceived effectiveness: Described as relatively effective, allowing for increase in activities of daily living (ADL) Undesirable effects: Side-effects or Adverse reactions: None reported Monitoring: [place] PMP: Online review of the past 27-monthperiod conducted. Compliant with practice rules and regulations Last UDS on record: Summary  Date Value Ref Range Status  10/18/2017 FINAL  Final    Comment:    ==================================================================== TOXASSURE SELECT 13 (MW) ==================================================================== Test                             Result       Flag       Units Drug Present not Declared for Prescription Verification   Tramadol                       >2370        UNEXPECTED ng/mg creat   O-Desmethyltramadol            1950  UNEXPECTED ng/mg creat   N-Desmethyltramadol            >2370        UNEXPECTED ng/mg creat    Source of tramadol is a prescription medication.    O-desmethyltramadol and N-desmethyltramadol are expected    metabolites of tramadol. Drug Absent but Declared for Prescription Verification   Oxycodone                      Not Detected UNEXPECTED ng/mg  creat ==================================================================== Test                      Result    Flag   Units      Ref Range   Creatinine              211              mg/dL      >=20 ==================================================================== Declared Medications:  The flagging and interpretation on this report are based on the  following declared medications.  Unexpected results may arise from  inaccuracies in the declared medications.  **Note: The testing scope of this panel includes these medications:  Oxycodone  **Note: The testing scope of this panel does not include following  reported medications:  Aspirin (Aspirin 81)  Benzonatate  Chlorthalidone  Docusate  Duloxetine  Esomeprazole  Fluticasone  Folic acid  Furosemide  Hydroxychloroquine  Infliximab  Levothyroxine  Loratadine  Methotrexate  Metoprolol  Quetiapine  Simvastatin  Spironolactone  Umeclidinium ==================================================================== For clinical consultation, please call [phone]. ====================================================================    UDS interpretation: Compliant          Medication Assessment Form: Reviewed. Patient indicates being compliant with therapy Treatment compliance: Compliant Risk Assessment Profile: Aberrant behavior: See initial evaluations. None observed or detected today Comorbid factors increasing risk of overdose: See initial evaluation. No additional risks detected today Opioid risk tool (ORT):  Opioid Risk  03/14/2018  Alcohol 3  Illegal Drugs 3  Rx Drugs 0  Alcohol 3  Illegal Drugs 4  Rx Drugs 0  Age between 16-45 years  0  History of Preadolescent Sexual Abuse 0  Psychological Disease 0  Depression -  Opioid Risk Tool Scoring 13  Opioid Risk Interpretation High Risk    ORT Scoring interpretation table:  Score <3 = Low Risk for SUD  Score between 4-7 = Moderate Risk for SUD  Score >8 = High  Risk for Opioid Abuse   Risk of substance use disorder (SUD): Low  Risk Mitigation Strategies:  Patient Counseling: Covered Patient-Prescriber Agreement (PPA): Present and active  Notification to other healthcare providers: Done  Pharmacologic Plan: No change in therapy, at this time.             ROS  Constitutional: Denies any fever or chills Gastrointestinal: No reported hemesis, hematochezia, vomiting, or acute GI distress Musculoskeletal: Denies any acute onset joint swelling, redness, loss of ROM, or weakness Neurological: No reported episodes of acute onset apraxia, aphasia, dysarthria, agnosia, amnesia, paralysis, loss of coordination, or loss of consciousness  Medication Review  DULoxetine, Fluticasone-Umeclidin-Vilant, MULTI-VITAMINS, QUEtiapine, Spacer/Aero Chamber Mouthpiece, UNABLE TO FIND, Vitamin D, aspirin EC, chlorthalidone, docusate sodium, folic acid, furosemide, hydroxychloroquine, inFLIXimab, levothyroxine, loratadine, methotrexate, metoprolol succinate, oxyCODONE, simvastatin, and spironolactone  History Review  Allergy: Ms. Lodico is allergic to bupropion and penicillins. Drug: Ms. Nordstrom  reports no history of drug use. Alcohol:  reports no history of alcohol use. Tobacco:  reports that she  has never smoked. She has never used smokeless tobacco. Social: Ms. Bacha  reports that she has never smoked. She has never used smokeless tobacco. She reports that she does not drink alcohol or use drugs. Medical:  has a past medical history of Allergy, Anxiety, Arthritis, degenerative (10/08/2013), Chronic kidney disease, Depression, Lumbar spinal stenosis with neurogenic claudication (11/07/2014), Major depression, single episode, in complete remission ([place]) (06/25/2015), Memory loss, short term (03/19/2014), Seizure ([place]) (10/07/2014), Sleep apnea, and Thyroid disease. Surgical: Ms. Sobczak  has a past surgical history that includes Abdominal hysterectomy; Cesarean section;  Replacement total knee (Left); and Hip surgery (Right). Family: family history includes Alcohol abuse in her father; Depression in her father and sister; Heart attack in her father; Hypertension in her mother and sister; Post-traumatic stress disorder in her father; Rheum arthritis in her sister; Stroke in her mother. Problem List: Ms. Goodhart does not have any pertinent problems on file.  Lab Review  Kidney Function Lab Results  Component Value Date   BUN 36 (H) 05/23/2017   CREATININE 1.63 (H) 05/23/2017   GFRAA 35 (L) 05/23/2017   GFRNONAA 30 (L) 05/23/2017  Liver Function Lab Results  Component Value Date   AST 19 07/15/2015   ALT 15 07/15/2015   ALBUMIN 4.0 07/15/2015  Note: Above Lab results reviewed.  Imaging Review  MM 3D SCREEN BREAST BILATERAL CLINICAL DATA:  Screening.  EXAM: DIGITAL SCREENING BILATERAL MAMMOGRAM WITH TOMO AND CAD  COMPARISON:  Previous exam(s).  ACR Breast Density Category b: There are scattered areas of fibroglandular density.  FINDINGS: There are no findings suspicious for malignancy. Images were processed with CAD.  IMPRESSION: No mammographic evidence of malignancy. A result letter of this screening mammogram will be mailed directly to the patient.  RECOMMENDATION: Screening mammogram in one year. (Code:SM-B-01Y)  BI-RADS CATEGORY  1: Negative.  Electronically Signed   By: Lillia Mountain M.D.   On: 02/23/2018 14:57 Note: Reviewed        Physical Exam  General appearance: Well nourished, well developed, and well hydrated. In no apparent acute distress Mental status: Alert, oriented x 3 (person, place, & time)       Respiratory: No evidence of acute respiratory distress Eyes: PERLA Vitals: BP 120/73   Pulse 75   Temp 98 F (36.7 C) (Oral)   Resp 18   Ht _0  (1.651 m)   Wt 265 lb (120.2 kg)   BMI 44.10 kg/m  BMI: Estimated body mass index is 44.1 kg/m as calculated from the following:   Height as of this encounter: _1   (1.651 m).   Weight as of this encounter: 265 lb (120.2 kg). Ideal: Ideal body weight: 57 kg (125 lb 10.6 oz) Adjusted ideal body weight: 82.3 kg (181 lb 6.4 oz) Lumbar Spine Area Exam  Skin & Axial Inspection: No masses, redness, or swelling Alignment: Symmetrical Functional ROM: Unrestricted ROM       Stability: No instability detected Muscle Tone/Strength: Functionally intact. No obvious neuro-muscular anomalies detected. Sensory (Neurological): Unimpaired Palpation: Complains of area being tender to palpation       Provocative Tests: Hyperextension/rotation test: Positive bilaterally for facet joint pain. Lumbar quadrant test (Kemp's test): deferred today       Lateral bending test: deferred today       Patrick's Maneuver: deferred today                    Gait & Posture Assessment  Ambulation: Patient ambulates using a walker  Gait: Relatively normal for age and body habitus Posture: WNL  Lower Extremity Exam    Side: Right lower extremity  Side: Left lower extremity  Stability: No instability observed          Stability: No instability observed          Skin & Extremity Inspection: Skin color, temperature, and hair growth are WNL. No peripheral edema or cyanosis. No masses, redness, swelling, asymmetry, or associated skin lesions. No contractures.  Skin & Extremity Inspection: Skin color, temperature, and hair growth are WNL. No peripheral edema or cyanosis. No masses, redness, swelling, asymmetry, or associated skin lesions. No contractures.  Functional ROM: Unrestricted ROM                  Functional ROM: Unrestricted ROM                  Muscle Tone/Strength: Functionally intact. No obvious neuro-muscular anomalies detected.  Muscle Tone/Strength: Functionally intact. No obvious neuro-muscular anomalies detected.  Sensory (Neurological): Unimpaired        Sensory (Neurological): Unimpaired            Palpation: No palpable anomalies  Palpation: No palpable anomalies    Assessment   Status Diagnosis  Controlled Controlled Worsening 1. Seronegative rheumatoid arthritis ([place])   2. Chronic pain syndrome   3. Lumbar spondylosis   4. Chronic hip pain (Left)   5. Long term prescription opiate use      Updated Problems: No problems updated.  Plan of Care  Pharmacotherapy (Medications Ordered): Meds ordered this encounter  Medications  . oxyCODONE (OXY IR/ROXICODONE) 5 MG immediate release tablet    Sig: Take 1 tablet (5 mg total) by mouth every 6 (six) hours as needed for up to 30 days for severe pain.    Dispense:  120 tablet    Refill:  0    Do not add this medication to the electronic "Automatic Refill" notification system. Patient may have prescription filled one day early if pharmacy is closed on scheduled refill date.    Order Specific Question:   Supervising Provider    Answer:   Milinda Pointer [phone]  . oxyCODONE (OXY IR/ROXICODONE) 5 MG immediate release tablet    Sig: Take 1 tablet (5 mg total) by mouth every 6 (six) hours as needed for up to 30 days for severe pain.    Dispense:  120 tablet    Refill:  0    Do not add this medication to the electronic "Automatic Refill" notification system. Patient may have prescription filled one day early if pharmacy is closed on scheduled refill date.    Order Specific Question:   Supervising Provider    Answer:   Milinda Pointer [[id number]]   Administered today: [name] had no medications administered during this visit.  Orders:  Orders Placed This Encounter  Procedures  . ToxASSURE Select 13 (MW), Urine    Volume: 30 ml(s). Minimum 3 ml of urine is needed. Document temperature of fresh sample. Indications: Long term (current) use of opiate analgesic (O96.295)    Follow-up plan:   Return in about 2 months (around 05/14/2018) for MedMgmt..    Interventional options: Considering:  Therapeutic series of 5 Hyalgan right knee injections  Diagnostic intra-articular left hip  joint injection  Possible left hip radiofrequency ablation  Diagnostic right intra-articular knee joint injection with local anesthetic and steroid  Diagnostic bilateral genicular nerve block  Possible bilateral genicular nerve radiofrequency ablation  Diagnostic bilateral  lumbar facet block  Possible bilateral lumbar facet radiofrequency ablation  Diagnostic bilateral L3-4 and/or L5-S1 transforaminal epidural steroid injections  Diagnostic L3-4 versus L4-5 lumbar epidural steroid injection    Palliative PRN treatment(s):  Therapeutic series of 5 Hyalgan right knee injections Diagnostic intra-articular left hip joint injection  Diagnostic right intra-articular knee joint injection with local anesthetic and steroid  Diagnostic bilateral genicular nerve block  Diagnostic bilateral lumbar facet block  Diagnostic bilateral L3-4 and/or L5-S1 transforaminal epidural steroid injections  Diagnostic L3-4 versus L4-5 lumbar epidural steroid injection    Note by: Dionisio David, NP Date: 03/14/2018; Time: 4:31 PM

## 2018-03-21 LAB — TOXASSURE SELECT 13 (MW), URINE

## 2018-03-21 NOTE — Progress Notes (Signed)
Patient continues to use Tramadol which is her husbands medication along with the Oxycodone. She is doing this even after being placed on monthly visits. She has declined interventional therapy.

## 2018-04-04 ENCOUNTER — Ambulatory Visit: Payer: Self-pay

## 2018-04-10 ENCOUNTER — Other Ambulatory Visit: Payer: Self-pay | Admitting: Psychiatry

## 2018-04-10 DIAGNOSIS — F3342 Major depressive disorder, recurrent, in full remission: Secondary | ICD-10-CM

## 2018-04-23 ENCOUNTER — Telehealth: Payer: Self-pay | Admitting: Psychiatry

## 2018-04-23 ENCOUNTER — Telehealth: Payer: Self-pay

## 2018-04-23 DIAGNOSIS — F3342 Major depressive disorder, recurrent, in full remission: Secondary | ICD-10-CM

## 2018-04-23 MED ORDER — DULOXETINE HCL 30 MG PO CPEP: ORAL_CAPSULE | ORAL | 1 refills | Status: DC

## 2018-04-23 MED ORDER — QUETIAPINE FUMARATE 50 MG PO TABS: 50.0000 mg | ORAL_TABLET | Freq: Two times a day (BID) | ORAL | 1 refills | Status: DC

## 2018-04-23 NOTE — Telephone Encounter (Signed)
tried to call patient back but the phone just rings, no answer no message could be left.

## 2018-04-23 NOTE — Telephone Encounter (Signed)
Sent cymbalta and seroquel to pharmacy

## 2018-04-23 NOTE — Telephone Encounter (Signed)
pt called left message that she needed refills on medications. this is a dr. Daleen Bo patient.

## 2018-04-24 ENCOUNTER — Other Ambulatory Visit: Payer: Self-pay

## 2018-04-24 ENCOUNTER — Ambulatory Visit (INDEPENDENT_AMBULATORY_CARE_PROVIDER_SITE_OTHER): Payer: Medicare Other | Admitting: Internal Medicine

## 2018-04-24 ENCOUNTER — Encounter: Payer: Self-pay | Admitting: Internal Medicine

## 2018-04-24 VITALS — BP 112/78 | HR 72 | Resp 16 | Ht 65.0 in | Wt 262.0 lb

## 2018-04-24 DIAGNOSIS — N289 Disorder of kidney and ureter, unspecified: Secondary | ICD-10-CM | POA: Diagnosis not present

## 2018-04-24 DIAGNOSIS — G4733 Obstructive sleep apnea (adult) (pediatric): Secondary | ICD-10-CM

## 2018-04-24 DIAGNOSIS — R0602 Shortness of breath: Secondary | ICD-10-CM | POA: Diagnosis not present

## 2018-04-24 DIAGNOSIS — M170 Bilateral primary osteoarthritis of knee: Secondary | ICD-10-CM

## 2018-04-24 NOTE — Progress Notes (Signed)
[facility] [address] [place], [place] [address]  Pulmonary Sleep Medicine   Office Visit Note  Patient Name: Laura [name] DOB: November 17, 1945 MRN [id number]  Date of Service: 04/24/2018  Complaints/HPI: OSA she states that she is doing relatively well with her OSA she has been using her machine no major issues have come up since last visit.  Patient denies having any cough no congestion no chest pain.  She has been avoiding public places and has been doing the recommendations followed by the CDC guidelines at this point.  Patient has not had any admissions to the hospital.  Counseling was also provided for COVID-19 prevention  ROS  General: (-) fever, (-) chills, (-) night sweats, (-) weakness Skin: (-) rashes, (-) itching,. Eyes: (-) visual changes, (-) redness, (-) itching. Nose and Sinuses: (-) nasal stuffiness or itchiness, (-) postnasal drip, (-) nosebleeds, (-) sinus trouble. Mouth and Throat: (-) sore throat, (-) hoarseness. Neck: (-) swollen glands, (-) enlarged thyroid, (-) neck pain. Respiratory: - cough, (-) bloody sputum, + shortness of breath, - wheezing. Cardiovascular: + ankle swelling, (-) chest pain. Lymphatic: (-) lymph node enlargement. Neurologic: (-) numbness, (-) tingling. Psychiatric: (-) anxiety, (-) depression   Current Medication: Outpatient Encounter Medications as of 04/24/2018  Medication Sig Note  . aspirin EC 81 MG tablet Take by mouth.   . chlorthalidone (HYGROTON) 25 MG tablet TAKE 1/2 TABLET (12.5MG ) BY MOUTH EVERY DAY   . Cholecalciferol (VITAMIN D) 2000 UNITS tablet Take by mouth daily.    Marland Kitchen. docusate sodium (COLACE) 100 MG capsule Take 100 mg by mouth 2 (two) times daily.   . DULoxetine (CYMBALTA) 30 MG capsule Two tablets in the morning, and one in the evening.   . Fluticasone-Umeclidin-Vilant (TRELEGY ELLIPTA) 100-62.5-25 MCG/INH AEPB Inhale into the lungs.   . folic acid (FOLVITE) 1 MG tablet Take 1 mg by mouth daily.   .  furosemide (LASIX) 40 MG tablet TAKE 1 TABLET ONCE DAILY   . hydroxychloroquine (PLAQUENIL) 200 MG tablet Take 200 mg by mouth daily.    Marland Kitchen. inFLIXimab (REMICADE) 100 MG injection Inject 100 mg into the vein every 8 (eight) weeks.   Marland Kitchen. levothyroxine (SYNTHROID, LEVOTHROID) 200 MCG tablet TAKE 1 TABLET ONCE DAILY- ON AN EMPTY STOMACH WITH A GLASS OF WATER 30-60 MINUTES BEFORE BREAKFAST   . loratadine (CLARITIN) 10 MG tablet Take by mouth daily as needed.    . methotrexate (RHEUMATREX) 2.5 MG tablet TAKE 4 TABLETS (10 MG TOTAL) BY MOUTH EVERY 7 (SEVEN) DAYS WITH A MEAL   . metoprolol succinate (TOPROL-XL) 50 MG 24 hr tablet TAKE 1 TABLET (50 MG TOTAL) BY MOUTH ONCE DAILY.   . Multiple Vitamin (MULTI-VITAMINS) TABS Take by mouth.   . oxyCODONE (OXY IR/ROXICODONE) 5 MG immediate release tablet Take 1 tablet (5 mg total) by mouth every 6 (six) hours as needed for up to 30 days for severe pain.   Marland Kitchen. QUEtiapine (SEROQUEL) 200 MG tablet TAKE 1 TABLET (200 MG TOTAL) BY MOUTH AT BEDTIME.   . QUEtiapine (SEROQUEL) 50 MG tablet Take 1 tablet (50 mg total) by mouth 2 (two) times daily.   Marland Kitchen. Spacer/Aero Chamber Mouthpiece MISC 1 Units by Does not apply route every 4 (four) hours as needed (wheezing).   Marland Kitchen. spironolactone (ALDACTONE) 25 MG tablet TAKE 1/2 (ONE-HALF) TABLET BY MOUTH DAILY 10/18/2017: Ran out of this is going to pick up this RX  . UNABLE TO FIND cholecalciferol (vitamin D3) 125 mcg (5,000 unit) capsule  TAKE 1 CAPSULE DAILY   . UNABLE TO FIND duloxetine 60 mg capsule,delayed release   . oxyCODONE (OXY IR/ROXICODONE) 5 MG immediate release tablet Take 1 tablet (5 mg total) by mouth every 6 (six) hours as needed for up to 30 days for severe pain.   . [DISCONTINUED] oxyCODONE (OXY IR/ROXICODONE) 5 MG immediate release tablet Take 1 tablet (5 mg total) by mouth every 6 (six) hours as needed for severe pain.   . [DISCONTINUED] simvastatin (ZOCOR) 10 MG tablet Take 10 mg by mouth daily.    No  facility-administered encounter medications on file as of 04/24/2018.     Surgical History: Past Surgical History:  Procedure Laterality Date  . ABDOMINAL HYSTERECTOMY    . CESAREAN SECTION    . HIP SURGERY Right   . REPLACEMENT TOTAL KNEE Left     Medical History: Past Medical History:  Diagnosis Date  . Allergy   . Anxiety   . Arthritis, degenerative 10/08/2013   Overview:    a.  Lumbar spine/spinal stenosis/foot drop.   b.  Hands.   . Chronic kidney disease   . Depression   . 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 Major depression, single episode, in complete remission (HCC) 06/25/2015  . Memory loss, short term 03/19/2014  . Seizure (HCC) 10/07/2014  . Sleep apnea   . Thyroid disease     Family History: Family History  Problem Relation Age of Onset  . Hypertension Mother   . Stroke Mother   . Heart attack Father   . Alcohol abuse Father   . Depression Father   . Post-traumatic stress disorder Father   . Rheum arthritis Sister   . Hypertension Sister   . Depression Sister   . Breast cancer Neg Hx     Social History: Social History   Socioeconomic History  . Marital status: Married    Spouse name: Not on file  . Number of children: Not on file  . Years of education: Not on file  . Highest education level: Not on file  Occupational History    Comment: retired  Engineer, productionocial Needs  . Financial resource strain: Not hard at all  . Food insecurity:    Worry: Never true    Inability: Never true  . Transportation needs:    Medical: No    Non-medical: No  Tobacco Use  . Smoking status: Never Smoker  . Smokeless tobacco: Never Used  Substance and Sexual Activity  . Alcohol use: No    Alcohol/week: 0.0 standard drinks  . Drug use: No  . Sexual activity: Not Currently  Lifestyle  . Physical activity:    Days per week: 0 days    Minutes per session: 0 min  . Stress: Not at all  Relationships  . Social connections:    Talks on phone:  More than three times a week    Gets together: Once a week    Attends religious service: Never    Active member of club or organization: No    Attends meetings of clubs or organizations: Never    Relationship status: Married  . Intimate partner violence:    Fear of current or ex partner: No    Emotionally abused: No    Physically abused: No    Forced sexual activity: No  Other Topics Concern  . Not on file  Social History Narrative  . Not on file    Vital Signs: Blood pressure 112/78, pulse 72, resp. rate  16, height 5\' 5"  (1.651 m), weight 262 lb (118.8 kg), SpO2 91 %.  Examination: General Appearance: The patient is well-developed, well-nourished, and in no distress. Skin: Gross inspection of skin unremarkable. Head: normocephalic, no gross deformities. Eyes: no gross deformities noted. ENT: ears appear grossly normal no exudates. Neck: Supple. No thyromegaly. No LAD. Respiratory: no rhonchi noted at this time. Cardiovascular: Normal S1 and S2 without murmur or rub. Extremities: No cyanosis. pulses are equal. Neurologic: Alert and oriented. No involuntary movements.  LABS: Recent Results (from the past 2160 hour(s))  ToxASSURE Select 13 (MW), Urine     Status: None   Collection Time: 03/14/18  3:14 PM  Result Value Ref Range   Summary FINAL     Comment: ==================================================================== TOXASSURE SELECT 13 (MW) ==================================================================== Test                             Result       Flag       Units Drug Present and Declared for Prescription Verification   Noroxycodone                   745          EXPECTED   ng/mg creat    Noroxycodone is an expected metabolite of oxycodone. Sources of    oxycodone include scheduled prescription medications. Drug Present not Declared for Prescription Verification   Tramadol                       4129         UNEXPECTED ng/mg creat   O-Desmethyltramadol             >5000        UNEXPECTED ng/mg creat   N-Desmethyltramadol            >5000        UNEXPECTED ng/mg creat    Source of tramadol is a prescription medication.    O-desmethyltramadol and N-desmethyltramadol are expected    metabolites of tramadol. Drug Absent but Declared for Prescription Verification   Oxycodone                      Not Detected UNEXP ECTED ng/mg creat    Oxycodone is almost always present in patients taking this drug    consistently.  Absence of oxycodone could be due to lapse of time    since the last dose or unusual pharmacokinetics (rapid    metabolism). ==================================================================== Test                      Result    Flag   Units      Ref Range   Creatinine              100              mg/dL      >=30 ==================================================================== Declared Medications:  The flagging and interpretation on this report are based on the  following declared medications.  Unexpected results may arise from  inaccuracies in the declared medications.  **Note: The testing scope of this panel includes these medications:  Oxycodone  **Note: The testing scope of this panel does not include following  reported medications:  Quetiapine  Simvastatin  Spironolactone ==================================================================== For clinica l consultation, please call [phone]. ====================================================================     Radiology: Mm 3d Screen Breast Bilateral  Result Date: 02/23/2018 CLINICAL DATA:  Screening. EXAM: DIGITAL SCREENING BILATERAL MAMMOGRAM WITH TOMO AND CAD COMPARISON:  Previous exam(s). ACR Breast Density Category b: There are scattered areas of fibroglandular density. FINDINGS: There are no findings suspicious for malignancy. Images were processed with CAD. IMPRESSION: No mammographic evidence of malignancy. A result letter of this screening mammogram  will be mailed directly to the patient. RECOMMENDATION: Screening mammogram in one year. (Code:SM-B-01Y) BI-RADS CATEGORY  1: Negative. Electronically Signed   By: Baird Lyons M.D.   On: 02/23/2018 14:57    No results found.  No results found.    Assessment and Plan: Patient Active Problem List   Diagnosis Date Noted  . Body mass index (BMI) of 40.0-44.9 in adult (HCC) 10/09/2017  . Postmenopausal 04/06/2017  . Chronic arthralgias of knees and hips (Right) 08/18/2016  . Arthritis 07/11/2016  . Dyspnea on exertion 07/11/2016  . Kidney disease 07/11/2016  . 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 Snoring 07/11/2016  . Morbid obesity with BMI of 40.0-44.9, adult (HCC) 07/11/2016  . Opioid-induced constipation (OIC) 04/21/2016  . Osteoarthritis of knee (Right) 01/25/2016  . Diet-controlled diabetes mellitus (HCC) 10/03/2015  . GAD (generalized anxiety disorder) 09/24/2015  . Disturbance of skin sensation 07/15/2015  . Elevated sedimentation rate 07/15/2015  . Elevated C-reactive protein (CRP) 07/15/2015  . Chronic hip pain (Left) 04/06/2015  . History of TKR (total knee replacement) (Left) 02/09/2015  . History of femur fracture (Right) 02/09/2015  . 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 Lumbar foraminal stenosis (Bilateral L3-4 and L5-S1) 02/09/2015  . Chronic low back pain (Location of Primary Source of Pain) (Bilateral) (L>R) 01/22/2015  . Chronic pain syndrome 11/25/2014  . Opioid dependence (HCC) 11/07/2014  . Lumbar spinal stenosis (5 mm Severe L3-4; 8 mm L4-5) 11/07/2014  . Lumbar spondylosis 11/07/2014  . Lumbar facet syndrome (Location of Primary Source of Pain) (Bilateral) (L>R) 11/07/2014  . Chronic knee pain (Right) 11/05/2014  . Opiate use (30 MME/Day) 11/05/2014  . Long term current use of opiate analgesic 11/05/2014  . Long term prescription opiate use 11/05/2014  . Encounter for  therapeutic drug level monitoring 11/05/2014  . Morbid obesity (HCC) 10/27/2014  . Extreme obesity (HCC) 10/07/2014  . Type 2 diabetes mellitus (HCC) 03/19/2014  . Depressive disorder 10/14/2013  . Essential (primary) hypertension 10/14/2013  . Insomnia secondary to chronic pain 07/09/2013  . Anxiety 07/09/2013  . GERD (gastroesophageal reflux disease) 01/18/2013  . Hypothyroidism 01/18/2013  . Hypercholesterolemia 01/18/2013  . 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 CKD (chronic kidney disease) stage 3, GFR 30-59 ml/min (HCC) 01/17/2013  . Congestive heart failure with left ventricular systolic dysfunction (HCC) 11/10/2012    1. OSA on CPAP we will continue with the current management and pressure settings.  Patient is tolerating them well. 2. CKD III she continues to follow with the nephrology labs have been relatively stable 3. Chronic arthitis she follows with Dr. Christena Flake clinic continue Dr. Jill Alexanders for the arthritis 4. Morbid obesity dietary management discussed again  General Counseling: I have discussed the findings of the evaluation and examination with Lanora Manis.  I have also discussed any further diagnostic evaluation thatmay be needed or ordered today. Margeaux verbalizes understanding of the findings of todays visit. We also reviewed her medications today and discussed drug interactions and side effects including but not limited excessive drowsiness and altered mental states. We also discussed that there is always a risk not  just to her but also people around her. she has been encouraged to call the office with any questions or concerns that should arise related to todays visit.    Time spent:  I have personally obtained a history, examined the patient, evaluated laboratory and imaging results, formulated the assessment and plan and placed orders.    Yevonne Pax, MD [facility] Pulmonary and Critical Care Sleep medicine

## 2018-04-24 NOTE — Patient Instructions (Signed)

## 2018-05-14 ENCOUNTER — Ambulatory Visit: Payer: Medicare Other | Attending: Nurse Practitioner | Admitting: Nurse Practitioner

## 2018-05-14 ENCOUNTER — Other Ambulatory Visit: Payer: Self-pay

## 2018-05-14 DIAGNOSIS — M47816 Spondylosis without myelopathy or radiculopathy, lumbar region: Secondary | ICD-10-CM

## 2018-05-14 DIAGNOSIS — M0600 Rheumatoid arthritis without rheumatoid factor, unspecified site: Secondary | ICD-10-CM | POA: Diagnosis not present

## 2018-05-14 DIAGNOSIS — Z6841 Body Mass Index (BMI) 40.0 and over, adult: Secondary | ICD-10-CM | POA: Diagnosis not present

## 2018-05-14 DIAGNOSIS — G894 Chronic pain syndrome: Secondary | ICD-10-CM | POA: Diagnosis not present

## 2018-05-14 MED ORDER — OXYCODONE HCL 5 MG PO TABS: 5.0000 mg | ORAL_TABLET | Freq: Four times a day (QID) | ORAL | 0 refills | Status: DC | PRN

## 2018-05-14 NOTE — Progress Notes (Signed)
Pain Management Encounter Note - Virtual Visit via Telephone Telehealth (real-time audio visits between healthcare provider and patient).  Patient's Phone No. & Preferred Pharmacy:  [phone] (home); [phone] (mobile); (Preferred) [phone]  [facility] [phone] [place], [place] - [address] [place] [address] Phone: [phone] Fax: [phone]   Pre-screening note:  Our staff contacted Laura [name] and offered her an "in person", "face-to-face" appointment versus a telephone encounter. She indicated preferring the telephone encounter, at this time.  Reason for Virtual Visit: COVID-19*  Social distancing based on CDC and AMA recommendations.   I contacted Laura [name] on 05/14/2018 at 10:00 AM by telephone and clearly identified myself as Thad Ranger, NP. I verified that I was speaking with the correct person using two identifiers (Name and date of birth: [birth year]).  Advanced Informed Consent I sought verbal advanced consent from Laura [name] for telemedicine interactions and virtual visit. I informed Laura [name] of the security and privacy concerns, risks, and limitations associated with performing an evaluation and management service by telephone. I also informed Laura [name] of the availability of "in person" appointments and I informed her of the possibility of a patient responsible charge related to this service. Laura [name] expressed understanding and agreed to proceed.   Historic Elements   Laura [name] is a 73 y.o. year old, female patient evaluated today after her last encounter by our practice on 03/14/2018. Laura [name]  has a past medical history of Allergy, Anxiety, Arthritis, degenerative (10/08/2013), Chronic kidney disease, Depression, Lumbar spinal stenosis with neurogenic claudication (11/07/2014), Major depression, single episode, in complete remission (HCC) (06/25/2015), Memory loss, short term (03/19/2014), Seizure (HCC)  (10/07/2014), Sleep apnea, and Thyroid disease. She also  has a past surgical history that includes Abdominal hysterectomy; Cesarean section; Replacement total knee (Left); and Hip surgery (Right). Laura [name] has a current medication list which includes the following prescription(s): aspirin ec, chlorthalidone, vitamin d, docusate sodium, duloxetine, fluticasone-umeclidin-vilant, folic acid, furosemide, hydroxychloroquine, infliximab, levothyroxine, loratadine, methotrexate, metoprolol succinate, multi-vitamins, oxycodone, oxycodone, quetiapine, quetiapine, spacer/aero chamber mouthpiece, spironolactone, UNABLE TO FIND, and UNABLE TO FIND. She  reports that she has never smoked. She has never used smokeless tobacco. She reports that she does not drink alcohol or use drugs. Laura [name] is allergic to bupropion and penicillins.   HPI  I last saw her on 03/14/2018. She is being evaluated for medication management. She rates her pain 3-4/10. She admits that it is affected by the weather. She feels like her low back, hands and feet. She denies any leg pain. She does have flare up of her RA and has swelling of the hands and feet. She has some numnbess in her left hand; thumb and index finger. She denies any falls or injuries.   Pharmacotherapy Assessment  Analgesic:Oxycodone IR 5 mg every 6 hours (20 mg/day) MME/day:30 mg/day   Monitoring: Pharmacotherapy: No side-effects or adverse reactions reported. [place] PMP: PDMP reviewed during this encounter.       Compliance: No problems identified. Plan: Refer to "POC".  Review of recent tests  MM 3D SCREEN BREAST BILATERAL CLINICAL DATA:  Screening.  EXAM: DIGITAL SCREENING BILATERAL MAMMOGRAM WITH TOMO AND CAD  COMPARISON:  Previous exam(s).  ACR Breast Density Category b: There are scattered areas of fibroglandular density.  FINDINGS: There are no findings suspicious for malignancy. Images were processed with CAD.  IMPRESSION: No mammographic  evidence of malignancy. A result letter of this screening mammogram will be mailed directly to the patient.  RECOMMENDATION: Screening mammogram in one year. (Code:SM-B-01Y)  BI-RADS CATEGORY  1: Negative.  Electronically Signed   By: Baird Lyonsina  Arceo M.D.   On: 02/23/2018 14:57   Office Visit on 03/14/2018  Component Date Value Ref Range Status  . Summary 03/14/2018 FINAL   Final   Comment: ==================================================================== TOXASSURE SELECT 13 (MW) ==================================================================== Test                             Result       Flag       Units Drug Present and Declared for Prescription Verification   Noroxycodone                   745          EXPECTED   ng/mg creat    Noroxycodone is an expected metabolite of oxycodone. Sources of    oxycodone include scheduled prescription medications. Drug Present not Declared for Prescription Verification   Tramadol                       4129         UNEXPECTED ng/mg creat   O-Desmethyltramadol            >5000        UNEXPECTED ng/mg creat   N-Desmethyltramadol            >5000        UNEXPECTED ng/mg creat    Source of tramadol is a prescription medication.    O-desmethyltramadol and N-desmethyltramadol are expected    metabolites of tramadol. Drug Absent but Declared for Prescription Verification   Oxycodone                      Not Detected UNEXP                          ECTED ng/mg creat    Oxycodone is almost always present in patients taking this drug    consistently.  Absence of oxycodone could be due to lapse of time    since the last dose or unusual pharmacokinetics (rapid    metabolism). ==================================================================== Test                      Result    Flag   Units      Ref Range   Creatinine              100              mg/dL      >=40>=20 ==================================================================== Declared  Medications:  The flagging and interpretation on this report are based on the  following declared medications.  Unexpected results may arise from  inaccuracies in the declared medications.  **Note: The testing scope of this panel includes these medications:  Oxycodone  **Note: The testing scope of this panel does not include following  reported medications:  Quetiapine  Simvastatin  Spironolactone ==================================================================== For clinica                          l consultation, please call [phone]. ====================================================================    Assessment  The primary encounter diagnosis was Seronegative rheumatoid arthritis (HCC). Diagnoses of Chronic pain syndrome, Lumbar spondylosis, and Body mass index (BMI) of 40.0-44.9 in adult [facility](HCC) were also pertinent to this  visit.  Plan of Care  I am having Laura [name] maintain her furosemide, loratadine, metoprolol succinate, Vitamin D, spironolactone, levothyroxine, hydroxychloroquine, folic acid, docusate sodium, Multi-Vitamins, aspirin EC, chlorthalidone, methotrexate, Spacer/Aero Chamber Mouthpiece, inFLIXimab, Fluticasone-Umeclidin-Vilant, UNABLE TO FIND, UNABLE TO FIND, QUEtiapine, DULoxetine, QUEtiapine, oxyCODONE, and oxyCODONE.  Pharmacotherapy (Medications Ordered): Meds ordered this encounter  Medications  . oxyCODONE (OXY IR/ROXICODONE) 5 MG immediate release tablet    Sig: Take 1 tablet (5 mg total) by mouth every 6 (six) hours as needed for up to 30 days for severe pain.    Dispense:  120 tablet    Refill:  0    Do not add this medication to the electronic "Automatic Refill" notification system. Patient may have prescription filled one day early if pharmacy is closed on scheduled refill date.    Order Specific Question:   Supervising Provider    Answer:   Delano MetzNAVEIRA, FRANCISCO [phone][[id number]]  . oxyCODONE (OXY IR/ROXICODONE) 5 MG immediate release tablet     Sig: Take 1 tablet (5 mg total) by mouth every 6 (six) hours as needed for up to 30 days for severe pain.    Dispense:  120 tablet    Refill:  0    Do not add this medication to the electronic "Automatic Refill" notification system. Patient may have prescription filled one day early if pharmacy is closed on scheduled refill date.    Order Specific Question:   Supervising Provider    Answer:   Delano MetzNAVEIRA, FRANCISCO [phone][[id number]]   Orders:  No orders of the defined types were placed in this encounter.  Follow-up plan:   Return in about 2 months (around 07/14/2018) for MedMgmt.   I discussed the assessment and treatment plan with the patient. The patient was provided an opportunity to ask questions and all were answered. The patient agreed with the plan and demonstrated an understanding of the instructions.  Patient advised to call back or seek an in-person evaluation if the symptoms or condition worsens.  Total duration of non-face-to-face encounter: 11 minutes.  Note by: Thad Rangerrystal , NP Date: 05/14/2018; Time: 10:11 AM  Disclaimer:  * Given the special circumstances of the COVID-19 pandemic, the federal government has announced that the Office for Civil Rights (OCR) will exercise its enforcement discretion and will not impose penalties on physicians using telehealth in the event of noncompliance with regulatory requirements under the [employer] Insurance Portability and Accountability Act (HIPAA) in connection with the good faith provision of telehealth during the COVID-19 national public health emergency. (AMA)

## 2018-05-14 NOTE — Patient Instructions (Signed)
____________________________________________________________________________________________  Medication Rules  Purpose: To inform patients, and their family members, of our rules and regulations.  Applies to: All patients receiving prescriptions (written or electronic).  Pharmacy of record: Pharmacy where electronic prescriptions will be sent. If written prescriptions are taken to a different pharmacy, please inform the nursing staff. The pharmacy listed in the electronic medical record should be the one where you would like electronic prescriptions to be sent.  Electronic prescriptions: In compliance with the [place] Strengthen Opioid Misuse Prevention (STOP) Act of 2017 (Session Law 2017-74/H243), effective January 10, 2018, all controlled substances must be electronically prescribed. Calling prescriptions to the pharmacy will cease to exist.  Prescription refills: Only during scheduled appointments. Applies to all prescriptions.  NOTE: The following applies primarily to controlled substances (Opioid* Pain Medications).   Patient's responsibilities: 1. Pain Pills: Bring all pain pills to every appointment (except for procedure appointments). 2. Pill Bottles: Bring pills in original pharmacy bottle. Always bring the newest bottle. Bring bottle, even if empty. 3. Medication refills: You are responsible for knowing and keeping track of what medications you take and those you need refilled. The day before your appointment: write a list of all prescriptions that need to be refilled. The day of the appointment: give the list to the admitting nurse. Prescriptions will be written only during appointments. No prescriptions will be written on procedure days. If you forget a medication: it will not be "Called in", "Faxed", or "electronically sent". You will need to get another appointment to get these prescribed. No early refills. Do not call asking to have your prescription filled  early. 4. Prescription Accuracy: You are responsible for carefully inspecting your prescriptions before leaving our office. Have the discharge nurse carefully go over each prescription with you, before taking them home. Make sure that your name is accurately spelled, that your address is correct. Check the name and dose of your medication to make sure it is accurate. Check the number of pills, and the written instructions to make sure they are clear and accurate. Make sure that you are given enough medication to last until your next medication refill appointment. 5. Taking Medication: Take medication as prescribed. When it comes to controlled substances, taking less pills or less frequently than prescribed is permitted and encouraged. Never take more pills than instructed. Never take medication more frequently than prescribed.  6. Inform other Doctors: Always inform, all of your healthcare providers, of all the medications you take. 7. Pain Medication from other Providers: You are not allowed to accept any additional pain medication from any other Doctor or Healthcare provider. There are two exceptions to this rule. (see below) In the event that you require additional pain medication, you are responsible for notifying us, as stated below. 8. Medication Agreement: You are responsible for carefully reading and following our Medication Agreement. This must be signed before receiving any prescriptions from our practice. Safely store a copy of your signed Agreement. Violations to the Agreement will result in no further prescriptions. (Additional copies of our Medication Agreement are available upon request.) 9. Laws, Rules, & Regulations: All patients are expected to follow all Federal and State Laws, Statutes, Rules, & Regulations. Ignorance of the Laws does not constitute a valid excuse. The use of any illegal substances is prohibited. 10. Adopted CDC guidelines & recommendations: Target dosing levels will be  at or below 60 MME/day. Use of benzodiazepines** is not recommended.  Exceptions: There are only two exceptions to the rule of not   receiving pain medications from other Healthcare Providers. 1. Exception #1 (Emergencies): In the event of an emergency (i.e.: accident requiring emergency care), you are allowed to receive additional pain medication. However, you are responsible for: As soon as you are able, call our office [phone], at any time of the day or night, and leave a message stating your name, the date and nature of the emergency, and the name and dose of the medication prescribed. In the event that your call is answered by a member of our staff, make sure to document and save the date, time, and the name of the person that took your information.  2. Exception #2 (Planned Surgery): In the event that you are scheduled by another doctor or dentist to have any type of surgery or procedure, you are allowed (for a period no longer than 30 days), to receive additional pain medication, for the acute post-op pain. However, in this case, you are responsible for picking up a copy of our "Post-op Pain Management for Surgeons" handout, and giving it to your surgeon or dentist. This document is available at our office, and does not require an appointment to obtain it. Simply go to our office during business hours (Monday-Thursday from 8:00 AM to 4:00 PM) (Friday 8:00 AM to 12:00 Noon) or if you have a scheduled appointment with us, prior to your surgery, and ask for it by name. In addition, you will need to provide us with your name, name of your surgeon, type of surgery, and date of procedure or surgery.  *Opioid medications include: morphine, codeine, oxycodone, oxymorphone, hydrocodone, hydromorphone, meperidine, tramadol, tapentadol, buprenorphine, fentanyl, methadone. **Benzodiazepine medications include: diazepam (Valium), alprazolam (Xanax), clonazepam (Klonopine), lorazepam (Ativan), clorazepate  (Tranxene), chlordiazepoxide (Librium), estazolam (Prosom), oxazepam (Serax), temazepam (Restoril), triazolam (Halcion) (Last updated: 03/09/2017) ____________________________________________________________________________________________    

## 2018-06-13 ENCOUNTER — Encounter: Payer: Self-pay | Admitting: Psychiatry

## 2018-06-13 ENCOUNTER — Other Ambulatory Visit: Payer: Self-pay

## 2018-06-13 ENCOUNTER — Ambulatory Visit (INDEPENDENT_AMBULATORY_CARE_PROVIDER_SITE_OTHER): Payer: Medicare Other | Admitting: Psychiatry

## 2018-06-13 DIAGNOSIS — F5101 Primary insomnia: Secondary | ICD-10-CM | POA: Diagnosis not present

## 2018-06-13 DIAGNOSIS — F3342 Major depressive disorder, recurrent, in full remission: Secondary | ICD-10-CM | POA: Diagnosis not present

## 2018-06-13 MED ORDER — QUETIAPINE FUMARATE ER 300 MG PO TB24: 300.0000 mg | ORAL_TABLET | Freq: Every day | ORAL | 0 refills | Status: DC

## 2018-06-13 NOTE — Progress Notes (Signed)
Virtual Visit via Telephone Note  I connected with Laura Caolizabeth [name] on 06/13/18 at  1:45 PM EDT by telephone and verified that I am speaking with the correct person using two identifiers.   I discussed the limitations, risks, security and privacy concerns of performing an evaluation and management service by telephone and the availability of in person appointments. I also discussed with the patient that there may be a patient responsible charge related to this service. The patient expressed understanding and agreed to proceed.    I discussed the assessment and treatment plan with the patient. The patient was provided an opportunity to ask questions and all were answered. The patient agreed with the plan and demonstrated an understanding of the instructions.   The patient was advised to call back or seek an in-person evaluation if the symptoms worsen or if the condition fails to improve as anticipated.   BH MD OP Progress Note  06/13/2018 5:33 PM Laura [name]  MRN:  [id number]  Chief Complaint:  Chief Complaint    Follow-up     HPI: Laura [name] is a 73 year old Caucasian female who has a history of hypertension, hyperlipidemia, depression in remission, insomnia, chronic pain, was evaluated by phone today.  Patient was offered video called however could not connect.  Patient reports she has been struggling with sleep problems since the past several weeks.  She reports she was doing okay on Seroquel 250 mg at night.  She however reports recently she has noticed that her sleep is interrupted several times.  She goes to bed at midnight and wakes up at around 10 AM in the morning.  Patient reports she has been worried about the pandemic that is going around.  She however has been coping okay.  She continues to have good support system from her husband.  She also has a son and a daughter who are supportive.  Patient reports she is also in a lot of pain all the time.  She has rheumatoid  arthritis as well as history of chronic pain.  She reports that is also another reason she cannot sleep well at night.  Patient reports she has tried sleep medications like Ambien, temazepam previously.  She reports Seroquel was working well up until a few weeks ago.  Discussed with patient to make sure she follows a good sleep hygiene.  She does not use any caffeine and does not use alcohol.  Discussed with her to set a good bedtime and a good wake up time.  Discussed with her that her Seroquel can be changed to an extended release and to combine it with the melatonin that she takes at bedtime.  She will monitor herself and let writer know. Visit Diagnosis:    ICD-10-CM   1. MDD (major depressive disorder), recurrent, in full remission (HCC) F33.42   2. Primary insomnia F51.01 QUEtiapine (SEROQUEL XR) 300 MG 24 hr tablet    Past Psychiatric History: Patient was under the care of Dr. Daleen Boavi and her last appointment with Dr. Daleen Boavi was 11/02/2017 in this clinic.  Past Medical History:  Past Medical History:  Diagnosis Date  . Allergy   . Anxiety   . Arthritis, degenerative 10/08/2013   Overview:    a.  Lumbar spine/spinal stenosis/foot drop.   b.  Hands.   . Chronic kidney disease   . Depression   . 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 Major depression, single episode, in complete remission (HCC) 06/25/2015  . Memory loss, short term  03/19/2014  . Seizure (HCC) 10/07/2014  . Sleep apnea   . Thyroid disease     Past Surgical History:  Procedure Laterality Date  . ABDOMINAL HYSTERECTOMY    . CESAREAN SECTION    . HIP SURGERY Right   . REPLACEMENT TOTAL KNEE Left     Family Psychiatric History: Father-alcoholism, PTSD, sister-depression.  Family History:  Family History  Problem Relation Age of Onset  . Hypertension Mother   . Stroke Mother   . Heart attack Father   . Alcohol abuse Father   . Depression Father   . Post-traumatic stress disorder Father    . Rheum arthritis Sister   . Hypertension Sister   . Depression Sister   . Breast cancer Neg Hx     Social History: Reports she has been married since the past 47 years.  She is currently retired.  She lives in [place] with her husband.  She has a son and a daughter who are adults.  She has grandchildren.  She has good social support system from all of them. Social History   Socioeconomic History  . Marital status: Married    Spouse name: Not on file  . Number of children: Not on file  . Years of education: Not on file  . Highest education level: Not on file  Occupational History    Comment: retired  Engineer, production  . Financial resource strain: Not hard at all  . Food insecurity:    Worry: Never true    Inability: Never true  . Transportation needs:    Medical: No    Non-medical: No  Tobacco Use  . Smoking status: Never Smoker  . Smokeless tobacco: Never Used  Substance and Sexual Activity  . Alcohol use: No    Alcohol/week: 0.0 standard drinks  . Drug use: No  . Sexual activity: Not Currently  Lifestyle  . Physical activity:    Days per week: 0 days    Minutes per session: 0 min  . Stress: Not at all  Relationships  . Social connections:    Talks on phone: More than three times a week    Gets together: Once a week    Attends religious service: Never    Active member of club or organization: No    Attends meetings of clubs or organizations: Never    Relationship status: Married  Other Topics Concern  . Not on file  Social History Narrative  . Not on file    Allergies:  Allergies  Allergen Reactions  . Bupropion   . Penicillins Rash    Metabolic Disorder Labs: No results found for: HGBA1C, MPG No results found for: PROLACTIN No results found for: CHOL, TRIG, HDL, CHOLHDL, VLDL, LDLCALC No results found for: TSH  Therapeutic Level Labs: No results found for: LITHIUM No results found for: VALPROATE No components found for:  CBMZ  Current  Medications: Current Outpatient Medications  Medication Sig Dispense Refill  . aspirin EC 81 MG tablet Take by mouth.    . chlorthalidone (HYGROTON) 25 MG tablet TAKE 1/2 TABLET (12.5MG ) BY MOUTH EVERY DAY  11  . Cholecalciferol (VITAMIN D) 2000 UNITS tablet Take by mouth daily.     Marland Kitchen docusate sodium (COLACE) 100 MG capsule Take 100 mg by mouth 2 (two) times daily.    . DULoxetine (CYMBALTA) 30 MG capsule Two tablets in the morning, and one in the evening. 270 capsule 1  . Fluticasone-Umeclidin-Vilant (TRELEGY ELLIPTA) 100-62.5-25 MCG/INH AEPB Inhale into the  lungs.    . folic acid (FOLVITE) 1 MG tablet Take 1 mg by mouth daily.    . furosemide (LASIX) 40 MG tablet TAKE 1 TABLET ONCE DAILY    . hydroxychloroquine (PLAQUENIL) 200 MG tablet Take 200 mg by mouth daily.     Marland Kitchen. inFLIXimab (REMICADE) 100 MG injection Inject 100 mg into the vein every 8 (eight) weeks.    Marland Kitchen. levothyroxine (SYNTHROID, LEVOTHROID) 200 MCG tablet TAKE 1 TABLET ONCE DAILY- ON AN EMPTY STOMACH WITH A GLASS OF WATER 30-60 MINUTES BEFORE BREAKFAST  5  . loratadine (CLARITIN) 10 MG tablet Take by mouth daily as needed.     . methotrexate (RHEUMATREX) 2.5 MG tablet TAKE 4 TABLETS (10 MG TOTAL) BY MOUTH EVERY 7 (SEVEN) DAYS WITH A MEAL  5  . metoprolol succinate (TOPROL-XL) 50 MG 24 hr tablet TAKE 1 TABLET (50 MG TOTAL) BY MOUTH ONCE DAILY.  1  . Multiple Vitamin (MULTI-VITAMINS) TABS Take by mouth.    [name]. [START ON 06/17/2018] oxyCODONE (OXY IR/ROXICODONE) 5 MG immediate release tablet Take 1 tablet (5 mg total) by mouth every 6 (six) hours as needed for up to 30 days for severe pain. 120 tablet 0  . oxyCODONE (OXY IR/ROXICODONE) 5 MG immediate release tablet Take 1 tablet (5 mg total) by mouth every 6 (six) hours as needed for up to 30 days for severe pain. 120 tablet 0  . QUEtiapine (SEROQUEL XR) 300 MG 24 hr tablet Take 1 tablet (300 mg total) by mouth at bedtime. For mood and sleep 30 tablet 0  . Spacer/Aero Chamber Mouthpiece  MISC 1 Units by Does not apply route every 4 (four) hours as needed (wheezing). 1 each 0  . spironolactone (ALDACTONE) 25 MG tablet TAKE 1/2 (ONE-HALF) TABLET BY MOUTH DAILY  11  . UNABLE TO FIND cholecalciferol (vitamin D3) 125 mcg (5,000 unit) capsule  TAKE 1 CAPSULE DAILY    . UNABLE TO FIND duloxetine 60 mg capsule,delayed release     No current facility-administered medications for this visit.      Musculoskeletal: Strength & Muscle Tone: UTA Gait & Station: Walks with walker Patient leans: N/A  Psychiatric Specialty Exam: Review of Systems  Psychiatric/Behavioral: The patient has insomnia.   All other systems reviewed and are negative.   There were no vitals taken for this visit.There is no height or weight on file to calculate BMI.  General Appearance: UTA  Eye Contact:  UTA  Speech:  Clear and Coherent  Volume:  Normal  Mood:  Euthymic  Affect:  UTA  Thought Process:  Goal Directed and Descriptions of Associations: Intact  Orientation:  Full (Time, Place, and Person)  Thought Content: Logical   Suicidal Thoughts:  No  Homicidal Thoughts:  No  Memory:  Immediate;   Fair Recent;   Fair Remote;   Fair  Judgement:  Fair  Insight:  Fair  Psychomotor Activity:  UTA  Concentration:  Concentration: Fair and Attention Span: Fair  Recall:  [employer]Fair  Fund of Knowledge: Fair  Language: Fair  Akathisia:  No  Handed:  Right  AIMS (if indicated): Denies tremors, rigidity  Assets:  Communication Skills Desire for Improvement Housing Social Support  ADL's:  Intact  Cognition: WNL  Sleep:  Poor   Screenings: PHQ2-9     [facility] Clinical Support from [facility]13/2017 [facility]   PHQ-2 Total Score  0  0  2  0  0       Assessment and Plan: Laura [name] is a 73 yr old Caucasian female, married, retired, lives in [place], has a history of depression currently in remission, primary insomnia, hypertension, congestive heart failure, GERD, diabetes melitis, hypothyroidism, rheumatoid arthritis, chronic pain, history of knee replacement, was evaluated by phone today.  Patient was offered video called however declined.  Patient reports she continues to struggle with sleep and hence will continue to need medication readjustment.  Plan  MDD in remission Continue Cymbalta as prescribed.  Primary insomnia- unstable Her sleep problems are likely due to multiple reasons including her chronic pain. Discussed sleep hygiene with patient.  Discussed with her to set a good bedtime and a wake up time.  Advised her to switch of TV or anything stimulating at least 1 to 2 hours prior to bedtime. Change Seroquel to extended release 300 mg p.o. nightly. Discontinue Seroquel 25 mg twice a day and also 200 mg at bedtime.  Have reviewed medical records in [facility] per Dr. Dorthula Rueavi-most recent one dated 11-02-2017-' patient with MDD-continue Cymbalta 60 mg in the a.m. and 30 mg in the evening and Seroquel 200 mg at bedtime and 50 mg twice a day.'  Follow-up in clinic in 3 to 4 weeks or sooner if needed.  Appointment scheduled for June 29 at 2:45 PM.  I have spent atleast 25 minutes non face to face with patient today. More than 50 % of the time was spent for psychoeducation and supportive psychotherapy and care coordination.  This note was generated in part or whole with voice recognition software. Voice recognition is usually quite accurate but there are transcription errors that can and very often do occur. I apologize for any typographical errors that were not detected and corrected.        Jomarie LongsSaramma Rheana Casebolt, MD 06/13/2018, 5:33 PM

## 2018-07-04 ENCOUNTER — Other Ambulatory Visit: Payer: Self-pay | Admitting: Psychiatry

## 2018-07-04 DIAGNOSIS — F3342 Major depressive disorder, recurrent, in full remission: Secondary | ICD-10-CM

## 2018-07-06 ENCOUNTER — Other Ambulatory Visit: Payer: Self-pay | Admitting: Psychiatry

## 2018-07-06 DIAGNOSIS — F5101 Primary insomnia: Secondary | ICD-10-CM

## 2018-07-09 ENCOUNTER — Ambulatory Visit (INDEPENDENT_AMBULATORY_CARE_PROVIDER_SITE_OTHER): Payer: Medicare Other | Admitting: Psychiatry

## 2018-07-09 ENCOUNTER — Encounter: Payer: Self-pay | Admitting: Psychiatry

## 2018-07-09 ENCOUNTER — Other Ambulatory Visit: Payer: Self-pay

## 2018-07-09 DIAGNOSIS — F5101 Primary insomnia: Secondary | ICD-10-CM | POA: Diagnosis not present

## 2018-07-09 DIAGNOSIS — F330 Major depressive disorder, recurrent, mild: Secondary | ICD-10-CM

## 2018-07-09 DIAGNOSIS — Z634 Disappearance and death of family member: Secondary | ICD-10-CM | POA: Diagnosis not present

## 2018-07-09 MED ORDER — DULOXETINE HCL 60 MG PO CPEP: 60.0000 mg | ORAL_CAPSULE | Freq: Two times a day (BID) | ORAL | 0 refills | Status: DC

## 2018-07-09 MED ORDER — TRAZODONE HCL 100 MG PO TABS: 50.0000 mg | ORAL_TABLET | Freq: Every day | ORAL | 0 refills | Status: DC

## 2018-07-09 NOTE — Progress Notes (Signed)
Virtual Visit via Telephone Note  I connected with Laura [name] on 07/09/18 at  2:45 PM EDT by telephone and verified that I am speaking with the correct person using two identifiers.   I discussed the limitations, risks, security and privacy concerns of performing an evaluation and management service by telephone and the availability of in person appointments. I also discussed with the patient that there may be a patient responsible charge related to this service. The patient expressed understanding and agreed to proceed.   I discussed the assessment and treatment plan with the patient. The patient was provided an opportunity to ask questions and all were answered. The patient agreed with the plan and demonstrated an understanding of the instructions.   The patient was advised to call back or seek an in-person evaluation if the symptoms worsen or if the condition fails to improve as anticipated.     BH MD OP Progress Note  07/09/2018 3:45 PM Laura [name]  MRN:  [id number]  Chief Complaint:  Chief Complaint    Follow-up     HPI: Laura [name] is a 73 year old Caucasian [name] who has a history of MDD, primary insomnia, chronic pain, hyperlipidemia was evaluated by phone today.  Patient was offered video call however declined.  Patient today reports she is currently struggling with a lot of stressors.  She reports one of her cousins committed suicide recently.  She also reports she is worried about the COVID-19.  She reports the Seroquel increased dosage is not helpful at all.  It does not help with her sleep or her mood.  She reports she feels sad often, and has been worrying a lot.  She reports she cannot sleep and because she cannot sleep she has been spending time sleeping during the day which is not very helpful with her mood.  She reports she does have good support system from her husband and her children as well as grandchildren.  She enjoys spending time with her  grandkids.  She reports he is interested in psychotherapy sessions when it was discussed with her.  Also discussed readjusting her medications.  Discussed stopping Seroquel which is not effective and starting trazodone.  She reports she may have tried it previously and it helped a lot at that time.  Also discussed readjusting her Cymbalta to a higher dosage.  Patient denies any suicidality, homicidality or perceptual disturbances. Visit Diagnosis:    ICD-10-CM   1. MDD (major depressive disorder), recurrent episode, mild (HCC)  F33.0 DULoxetine (CYMBALTA) 60 MG capsule    traZODone (DESYREL) 100 MG tablet  2. Primary insomnia  F51.01 traZODone (DESYREL) 100 MG tablet  3. Bereavement  Z63.4     Past Psychiatric History: I have reviewed past psychiatric history from my progress note on 06/13/2018.  Past Medical History:  Past Medical History:  Diagnosis Date  . Allergy   . Anxiety   . Arthritis, degenerative 10/08/2013   Overview:    a.  Lumbar spine/spinal stenosis/foot drop.   b.  Hands.   . Chronic kidney disease   . Depression   . 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 Major depression, single episode, in complete remission (HCC) 06/25/2015  . Memory loss, short term 03/19/2014  . Seizure (HCC) 10/07/2014  . Sleep apnea   . Thyroid disease     Past Surgical History:  Procedure Laterality Date  . ABDOMINAL HYSTERECTOMY    . CESAREAN SECTION    . HIP SURGERY Right   . REPLACEMENT  TOTAL KNEE Left     Family Psychiatric History: I have reviewed family psychiatric history from my progress note on 06/13/2018.  Family History:  Family History  Problem Relation Age of Onset  . Hypertension Mother   . Stroke Mother   . Heart attack Father   . Alcohol abuse Father   . Depression Father   . Post-traumatic stress disorder Father   . Rheum arthritis Sister   . Hypertension Sister   . Depression Sister   . Breast cancer Neg Hx     Social History: I have  reviewed social history from my progress note on 06/13/2018. Social History   Socioeconomic History  . Marital status: Married    Spouse name: Not on file  . Number of children: Not on file  . Years of education: Not on file  . Highest education level: Not on file  Occupational History    Comment: retired  Engineer, production  . Financial resource strain: Not hard at all  . Food insecurity    Worry: Never true    Inability: Never true  . Transportation needs    Medical: No    Non-medical: No  Tobacco Use  . Smoking status: Never Smoker  . Smokeless tobacco: Never Used  Substance and Sexual Activity  . Alcohol use: No    Alcohol/week: 0.0 standard drinks  . Drug use: No  . Sexual activity: Not Currently  Lifestyle  . Physical activity    Days per week: 0 days    Minutes per session: 0 min  . Stress: Not at all  Relationships  . Social connections    Talks on phone: More than three times a week    Gets together: Once a week    Attends religious service: Never    Active member of club or organization: No    Attends meetings of clubs or organizations: Never    Relationship status: Married  Other Topics Concern  . Not on file  Social History Narrative  . Not on file    Allergies:  Allergies  Allergen Reactions  . Bupropion   . Penicillins Rash    Metabolic Disorder Labs: No results found for: HGBA1C, MPG No results found for: PROLACTIN No results found for: CHOL, TRIG, HDL, CHOLHDL, VLDL, LDLCALC No results found for: TSH  Therapeutic Level Labs: No results found for: LITHIUM No results found for: VALPROATE No components found for:  CBMZ  Current Medications: Current Outpatient Medications  Medication Sig Dispense Refill  . aspirin EC 81 MG tablet Take by mouth.    . chlorthalidone (HYGROTON) 25 MG tablet TAKE 1/2 TABLET (12.5MG ) BY MOUTH EVERY DAY  11  . Cholecalciferol (VITAMIN D) 2000 UNITS tablet Take by mouth daily.     Marland Kitchen docusate sodium (COLACE) 100 MG  capsule Take 100 mg by mouth 2 (two) times daily.    . DULoxetine (CYMBALTA) 60 MG capsule Take 1 capsule (60 mg total) by mouth 2 (two) times daily. 180 capsule 0  . Fluticasone-Umeclidin-Vilant (TRELEGY ELLIPTA) 100-62.5-25 MCG/INH AEPB Inhale into the lungs.    . folic acid (FOLVITE) 1 MG tablet Take 1 mg by mouth daily.    . furosemide (LASIX) 40 MG tablet TAKE 1 TABLET ONCE DAILY    . hydroxychloroquine (PLAQUENIL) 200 MG tablet Take 200 mg by mouth daily.     Marland Kitchen inFLIXimab (REMICADE) 100 MG injection Inject 100 mg into the vein every 8 (eight) weeks.    Marland Kitchen levothyroxine (SYNTHROID, LEVOTHROID) 200  MCG tablet TAKE 1 TABLET ONCE DAILY- ON AN EMPTY STOMACH WITH A GLASS OF WATER 30-60 MINUTES BEFORE BREAKFAST  5  . loratadine (CLARITIN) 10 MG tablet Take by mouth daily as needed.     . methotrexate (RHEUMATREX) 2.5 MG tablet TAKE 4 TABLETS (10 MG TOTAL) BY MOUTH EVERY 7 (SEVEN) DAYS WITH A MEAL  5  . metoprolol succinate (TOPROL-XL) 50 MG 24 hr tablet TAKE 1 TABLET (50 MG TOTAL) BY MOUTH ONCE DAILY.  1  . Multiple Vitamin (MULTI-VITAMINS) TABS Take by mouth.    . oxyCODONE (OXY IR/ROXICODONE) 5 MG immediate release tablet Take 1 tablet (5 mg total) by mouth every 6 (six) hours as needed for up to 30 days for severe pain. 120 tablet 0  . oxyCODONE (OXY IR/ROXICODONE) 5 MG immediate release tablet Take 1 tablet (5 mg total) by mouth every 6 (six) hours as needed for up to 30 days for severe pain. 120 tablet 0  . Spacer/Aero Chamber Mouthpiece MISC 1 Units by Does not apply route every 4 (four) hours as needed (wheezing). 1 each 0  . spironolactone (ALDACTONE) 25 MG tablet TAKE 1/2 (ONE-HALF) TABLET BY MOUTH DAILY  11  . traZODone (DESYREL) 100 MG tablet Take 0.5-1 tablets (50-100 mg total) by mouth at bedtime. 90 tablet 0  . UNABLE TO FIND cholecalciferol (vitamin D3) 125 mcg (5,000 unit) capsule  TAKE 1 CAPSULE DAILY    . UNABLE TO FIND duloxetine 60 mg capsule,delayed release     No current  facility-administered medications for this visit.      Musculoskeletal: Strength & Muscle Tone: within normal limits Gait & Station: normal Patient leans: N/A  Psychiatric Specialty Exam: Review of Systems  Psychiatric/Behavioral: Positive for depression. The patient has insomnia.   All other systems reviewed and are negative.   There were no vitals taken for this visit.There is no height or weight on file to calculate BMI.  General Appearance: Casual  Eye Contact:  Good  Speech:  Normal Rate  Volume:  Normal  Mood:  Depressed  Affect:  Congruent  Thought Process:  Goal Directed and Descriptions of Associations: Intact  Orientation:  Full (Time, Place, and Person)  Thought Content: Logical   Suicidal Thoughts:  No  Homicidal Thoughts:  No  Memory:  Immediate;   Fair Recent;   Fair Remote;   Fair  Judgement:  Fair  Insight:  Fair  Psychomotor Activity:  Normal  Concentration:  Concentration: Fair and Attention Span: Fair  Recall:  [employer]Fair  Fund of Knowledge: Fair  Language: Fair  Akathisia:  No  Handed:  Right  AIMS (if indicated): denies tremors, rigidity  Assets:  Communication Skills Desire for Improvement Social Support  ADL's:  Intact  Cognition: WNL  Sleep:  Poor   Screenings: PHQ2-9     [facility]  PHQ-2 Total Score  0  0  2  0  0       Assessment and Plan: Laura [name] is a 73 year old Caucasian [name], married, retired, [place], has a history of depression currently in remission, primary insomnia, hypertension, congestive heart failure, GERD, diabetes melitis, hypothyroidism, rheumatoid arthritis, chronic  pain, history of knee replacement was evaluated by phone  today.  Patient continues to struggle with mood symptoms and sleep problems and has the current psychosocial stressors of death in her family as well as the COVID-19 outbreak.  Patient will benefit from medication readjustment as well as psychotherapy sessions. Plan MDD-worsening Increase Cymbalta to 120 mg p.o. daily in divided dosage Discontinue Seroquel for lack of benefit. Refer for CBT with therapist here in clinic.  For primary insomnia-unstable Discontinue Seroquel. Start trazodone 50 mg p.o. nightly for a week and then to 100 mg at bedtime as needed  For Bereavement- unstable Refer for CBT/Grief counseling.   Follow-up in clinic in 4 weeks or sooner if needed.  August 17 at 4:45 PM  I have spent atleast 15 minutes non face to face with patient today. More than 50 % of the time was spent for psychoeducation and supportive psychotherapy and care coordination.  This note was generated in part or whole with voice recognition software. Voice recognition is usually quite accurate but there are transcription errors that can and very often do occur. I apologize for any typographical errors that were not detected and corrected.       Ursula Alert, MD 07/09/2018, 3:45 PM

## 2018-07-10 ENCOUNTER — Encounter: Payer: Self-pay | Admitting: Pain Medicine

## 2018-07-11 ENCOUNTER — Other Ambulatory Visit: Payer: Self-pay

## 2018-07-11 ENCOUNTER — Ambulatory Visit: Payer: Medicare Other | Attending: Pain Medicine | Admitting: Pain Medicine

## 2018-07-11 DIAGNOSIS — M5441 Lumbago with sciatica, right side: Secondary | ICD-10-CM

## 2018-07-11 DIAGNOSIS — M48061 Spinal stenosis, lumbar region without neurogenic claudication: Secondary | ICD-10-CM

## 2018-07-11 DIAGNOSIS — G894 Chronic pain syndrome: Secondary | ICD-10-CM

## 2018-07-11 DIAGNOSIS — M47816 Spondylosis without myelopathy or radiculopathy, lumbar region: Secondary | ICD-10-CM

## 2018-07-11 DIAGNOSIS — M5442 Lumbago with sciatica, left side: Secondary | ICD-10-CM | POA: Diagnosis not present

## 2018-07-11 DIAGNOSIS — M25561 Pain in right knee: Secondary | ICD-10-CM

## 2018-07-11 DIAGNOSIS — M899 Disorder of bone, unspecified: Secondary | ICD-10-CM | POA: Insufficient documentation

## 2018-07-11 DIAGNOSIS — Z789 Other specified health status: Secondary | ICD-10-CM | POA: Insufficient documentation

## 2018-07-11 DIAGNOSIS — M1711 Unilateral primary osteoarthritis, right knee: Secondary | ICD-10-CM

## 2018-07-11 DIAGNOSIS — G8929 Other chronic pain: Secondary | ICD-10-CM

## 2018-07-11 DIAGNOSIS — Z79899 Other long term (current) drug therapy: Secondary | ICD-10-CM | POA: Insufficient documentation

## 2018-07-11 MED ORDER — OXYCODONE HCL 5 MG PO TABS: 5.0000 mg | ORAL_TABLET | Freq: Four times a day (QID) | ORAL | 0 refills | Status: DC | PRN

## 2018-07-11 NOTE — Patient Instructions (Addendum)
____________________________________________________________________________________________  Medication Recommendations and Reminders  Applies to: All patients receiving prescriptions (written and/or electronic).  Medication Rules & Regulations: These rules and regulations exist for your safety and that of others. They are not flexible and neither are we. Dismissing or ignoring them will be considered "non-compliance" with medication therapy, resulting in complete and irreversible termination of such therapy. (See document titled "Medication Rules" for more details.) In all conscience, because of safety reasons, we cannot continue providing a therapy where the patient does not follow instructions.  Pharmacy of record:   Definition: This is the pharmacy where your electronic prescriptions will be sent.   We do not endorse any particular pharmacy.  You are not restricted in your choice of pharmacy.  The pharmacy listed in the electronic medical record should be the one where you want electronic prescriptions to be sent.  If you choose to change pharmacy, simply notify our nursing staff of your choice of new pharmacy.  Recommendations:  Keep all of your pain medications in a safe place, under lock and key, even if you live alone.   After you fill your prescription, take 1 week's worth of pills and put them away in a safe place. You should keep a separate, properly labeled bottle for this purpose. The remainder should be kept in the original bottle. Use this as your primary supply, until it runs out. Once it's gone, then you know that you have 1 week's worth of medicine, and it is time to come in for a prescription refill. If you do this correctly, it is unlikely that you will ever run out of medicine.  To make sure that the above recommendation works, it is very important that you make sure your medication refill appointments are scheduled at least 1 week before you run out of medicine. To do  this in an effective manner, make sure that you do not leave the office without scheduling your next medication management appointment. Always ask the nursing staff to show you in your prescription , when your medication will be running out. Then arrange for the receptionist to get you a return appointment, at least 7 days before you run out of medicine. Do not wait until you have 1 or 2 pills left, to come in. This is very poor planning and does not take into consideration that we may need to cancel appointments due to bad weather, sickness, or emergencies affecting our staff.  "Partial Fill": If for any reason your pharmacy does not have enough pills/tablets to completely fill or refill your prescription, do not allow for a "partial fill". You will need a separate prescription to fill the remaining amount, which we will not provide. If the reason for the partial fill is your insurance, you will need to talk to the pharmacist about payment alternatives for the remaining tablets, but again, do not accept a partial fill.  Prescription refills and/or changes in medication(s):   Prescription refills, and/or changes in dose or medication, will be conducted only during scheduled medication management appointments. (Applies to both, written and electronic prescriptions.)  No refills on procedure days. No medication will be changed or started on procedure days. No changes, adjustments, and/or refills will be conducted on a procedure day. Doing so will interfere with the diagnostic portion of the procedure.  No phone refills. No medications will be "called into the pharmacy".  No Fax refills.  No weekend refills.  No Holliday refills.  No after hours refills.  Remember:  Business   hours are:  Monday to Thursday 8:00 AM to 4:00 PM Provider's Schedule: Crystal King, NP - Appointments are:  Medication management: Monday to Thursday 8:00 AM to 4:00 PM Shahan Starks, MD - Appointments are:   Medication management: Monday and Wednesday 8:00 AM to 4:00 PM Procedure day: Tuesday and Thursday 7:30 AM to 4:00 PM Bilal Lateef, MD - Appointments are:  Medication management: Tuesday and Thursday 8:00 AM to 4:00 PM Procedure day: Monday and Wednesday 7:30 AM to 4:00 PM (Last update: 03/09/2017) ____________________________________________________________________________________________   ____________________________________________________________________________________________  Preparing for your procedure (without sedation)  Procedure appointments are limited to planned procedures: . No Prescription Refills. . No disability issues will be discussed. . No medication changes will be discussed.  Instructions: . Oral Intake: Do not eat or drink anything for at least 3 hours prior to your procedure. . Transportation: Unless otherwise stated by your physician, you may drive yourself after the procedure. . Blood Pressure Medicine: Take your blood pressure medicine with a sip of water the morning of the procedure. . Blood thinners: Notify our staff if you are taking any blood thinners. Depending on which one you take, there will be specific instructions on how and when to stop it. . Diabetics on insulin: Notify the staff so that you can be scheduled 1st case in the morning. If your diabetes requires high dose insulin, take only  of your normal insulin dose the morning of the procedure and notify the staff that you have done so. . Preventing infections: Shower with an antibacterial soap the morning of your procedure.  . Build-up your immune system: Take 1000 mg of Vitamin C with every meal (3 times a day) the day prior to your procedure. . Antibiotics: Inform the staff if you have a condition or reason that requires you to take antibiotics before dental procedures. . Pregnancy: If you are pregnant, call and cancel the procedure. . Sickness: If you have a cold, fever, or any active  infections, call and cancel the procedure. . Arrival: You must be in the facility at least 30 minutes prior to your scheduled procedure. . Children: Do not bring any children with you. . Dress appropriately: Bring dark clothing that you would not mind if they get stained. . Valuables: Do not bring any jewelry or valuables.  Reasons to call and reschedule or cancel your procedure: (Following these recommendations will minimize the risk of a serious complication.) . Surgeries: Avoid having procedures within 2 weeks of any surgery. (Avoid for 2 weeks before or after any surgery). . Flu Shots: Avoid having procedures within 2 weeks of a flu shots or . (Avoid for 2 weeks before or after immunizations). . Barium: Avoid having a procedure within 7-10 days after having had a radiological study involving the use of radiological contrast. (Myelograms, Barium swallow or enema study). . Heart attacks: Avoid any elective procedures or surgeries for the initial 6 months after a "Myocardial Infarction" (Heart Attack). . Blood thinners: It is imperative that you stop these medications before procedures. Let us know if you if you take any blood thinner.  . Infection: Avoid procedures during or within two weeks of an infection (including chest colds or gastrointestinal problems). Symptoms associated with infections include: Localized redness, fever, chills, night sweats or profuse sweating, burning sensation when voiding, cough, congestion, stuffiness, runny nose, sore throat, diarrhea, nausea, vomiting, cold or Flu symptoms, recent or current infections. It is specially important if the infection is over the area that we intend to   treat. . Heart and lung problems: Symptoms that may suggest an active cardiopulmonary problem include: cough, chest pain, breathing difficulties or shortness of breath, dizziness, ankle swelling, uncontrolled high or unusually low blood pressure, and/or palpitations. If you are experiencing  any of these symptoms, cancel your procedure and contact your primary care physician for an evaluation.  Remember:  Regular Business hours are:  Monday to Thursday 8:00 AM to 4:00 PM  Provider's Schedule: Teodor Prater, MD:  Procedure days: Tuesday and Thursday 7:30 AM to 4:00 PM  Bilal Lateef, MD:  Procedure days: Monday and Wednesday 7:30 AM to 4:00 PM ____________________________________________________________________________________________    

## 2018-07-11 NOTE — Progress Notes (Signed)
Pain Management Virtual Encounter Note - Virtual Visit via Telephone Telehealth (real-time audio visits between healthcare provider and patient).   Patient's Phone No. & Preferred Pharmacy:  [phone] (home); [phone] (mobile); (Preferred) [phone] [email or web address]  [facility] [id number] [place]- , [place] - [address] [place] [place] [id number] Phone: [phone] Fax: [phone]    Pre-screening note:  Our staff contacted Laura [name] and offered her an "in person", "face-to-face" appointment versus a telephone encounter. She indicated preferring the telephone encounter, at this time.   Reason for Virtual Visit: COVID-19*  Social distancing based on CDC and AMA recommendations.   I contacted Laura [name] on 07/11/2018 via telephone.      I clearly identified myself as Oswaldo DoneFrancisco A Olaf Mesa, MD. I verified that I was speaking with the correct person using two identifiers (Name: Laura [name], and date of birth: 1945-05-23).  Advanced Informed Consent I sought verbal advanced consent from Laura [name] for virtual visit interactions. I informed Laura [name] of possible security and privacy concerns, risks, and limitations associated with providing "not-in-person" medical evaluation and management services. I also informed Laura [name] of the availability of "in-person" appointments. Finally, I informed her that there would be a charge for the virtual visit and that she could be  personally, fully or partially, financially responsible for it. Laura [name] expressed understanding and agreed to proceed.   Historic Elements   [name] is a 73 y.o. year old, female patient evaluated today after her last encounter by our practice on 05/14/2018. Laura [name]  has a past medical history of Allergy, Anxiety, Arthritis, degenerative (10/08/2013), Chronic kidney disease, Depression, Lumbar spinal stenosis with neurogenic claudication (11/07/2014), Major depression,  single episode, in complete remission (HCC) (06/25/2015), Memory loss, short term (03/19/2014), Seizure (HCC) (10/07/2014), Sleep apnea, and Thyroid disease. She also  has a past surgical history that includes Abdominal hysterectomy; Cesarean section; Replacement total knee (Left); and Hip surgery (Right). Laura [name] has a current medication list which includes the following prescription(s): amlodipine, aspirin ec, chlorthalidone, vitamin d, docusate sodium, duloxetine, fluticasone-umeclidin-vilant, folic acid, furosemide, hydroxychloroquine, infliximab, levothyroxine, levothyroxine, loratadine, methotrexate, metoprolol succinate, multi-vitamins, oxycodone, oxycodone, oxycodone, spacer/aero chamber mouthpiece, spironolactone, trazodone, and UNABLE TO FIND. She  reports that she has never smoked. She has never used smokeless tobacco. She reports that she does not drink alcohol or use drugs. Laura [name] is allergic to bupropion and penicillins.   HPI  Today, she is being contacted for medication management.  Pharmacotherapy Assessment  Analgesic: Oxycodone IR 5 mg every 6 hours (20 mg/day) MME/day:30 mg/day.   Monitoring: Pharmacotherapy: No side-effects or adverse reactions reported. [place] PMP: PDMP reviewed during this encounter.       Compliance: No problems identified. Effectiveness: Clinically acceptable. Plan: Refer to "POC".  Pertinent Labs   SAFETY SCREENING Profile No results found for: SARSCOV2NAA, COVIDSOURCE, STAPHAUREUS, MRSAPCR, HCVAB, HIV, PREGTESTUR Renal Function Lab Results  Component Value Date   BUN 36 (H) 05/23/2017   CREATININE 1.63 (H) 05/23/2017   GFRAA 35 (L) 05/23/2017   GFRNONAA 30 (L) 05/23/2017   Hepatic Function Lab Results  Component Value Date   AST 19 07/15/2015   ALT 15 07/15/2015   ALBUMIN 4.0 07/15/2015   UDS Summary  Date Value Ref Range Status  03/14/2018 FINAL  Final    Comment:     ==================================================================== TOXASSURE SELECT 13 (MW) ==================================================================== Test  Result       Flag       Units Drug Present and Declared for Prescription Verification   Noroxycodone                   745          EXPECTED   ng/mg creat    Noroxycodone is an expected metabolite of oxycodone. Sources of    oxycodone include scheduled prescription medications. Drug Present not Declared for Prescription Verification   Tramadol                       4129         UNEXPECTED ng/mg creat   O-Desmethyltramadol            >5000        UNEXPECTED ng/mg creat   N-Desmethyltramadol            >5000        UNEXPECTED ng/mg creat    Source of tramadol is a prescription medication.    O-desmethyltramadol and N-desmethyltramadol are expected    metabolites of tramadol. Drug Absent but Declared for Prescription Verification   Oxycodone                      Not Detected UNEXPECTED ng/mg creat    Oxycodone is almost always present in patients taking this drug    consistently.  Absence of oxycodone could be due to lapse of time    since the last dose or unusual pharmacokinetics (rapid    metabolism). ==================================================================== Test                      Result    Flag   Units      Ref Range   Creatinine              100              mg/dL      >=20 ==================================================================== Declared Medications:  The flagging and interpretation on this report are based on the  following declared medications.  Unexpected results may arise from  inaccuracies in the declared medications.  **Note: The testing scope of this panel includes these medications:  Oxycodone  **Note: The testing scope of this panel does not include following  reported medications:  Quetiapine  Simvastatin   Spironolactone ==================================================================== For clinical consultation, please call [phone]. ====================================================================    Note: Above Lab results reviewed.  Recent imaging  MM 3D SCREEN BREAST BILATERAL CLINICAL DATA:  Screening.  EXAM: DIGITAL SCREENING BILATERAL MAMMOGRAM WITH TOMO AND CAD  COMPARISON:  Previous exam(s).  ACR Breast Density Category b: There are scattered areas of fibroglandular density.  FINDINGS: There are no findings suspicious for malignancy. Images were processed with CAD.  IMPRESSION: No mammographic evidence of malignancy. A result letter of this screening mammogram will be mailed directly to the patient.  RECOMMENDATION: Screening mammogram in one year. (Code:[id number]-B-01Y)  BI-RADS CATEGORY  1: Negative.  Electronically Signed   By: Lillia Mountain M.D.   On: 02/23/2018 14:57  Assessment  The primary encounter diagnosis was Chronic low back pain (Primary Area of Pain) (Bilateral) (L>R). Diagnoses of Lumbar facet syndrome (Bilateral) (L>R), Spinal stenosis of lumbar region, unspecified whether neurogenic claudication present, Chronic knee pain (Right), Osteoarthritis of knee (Right), Chronic pain syndrome, Pharmacologic therapy, Disorder of skeletal system, and Problems influencing health status were also pertinent to this  visit.  Plan of Care  I have discontinued Lanora Manis Jauregui's oxyCODONE. I have also changed her oxyCODONE. Additionally, I am having her start on oxyCODONE and oxyCODONE. Lastly, I am having her maintain her furosemide, loratadine, metoprolol succinate, Vitamin D, spironolactone, levothyroxine, hydroxychloroquine, folic acid, docusate sodium, Multi-Vitamins, aspirin EC, chlorthalidone, methotrexate, Spacer/Aero Chamber Mouthpiece, inFLIXimab, Fluticasone-Umeclidin-Vilant, UNABLE TO FIND, DULoxetine, traZODone, amLODipine, and  levothyroxine.  Pharmacotherapy (Medications Ordered): Meds ordered this encounter  Medications  . oxyCODONE (OXY IR/ROXICODONE) 5 MG immediate release tablet    Sig: Take 1 tablet (5 mg total) by mouth every 6 (six) hours as needed for severe pain. Must last 30 days    Dispense:  120 tablet    Refill:  0    Chronic Pain: STOP Act (Not applicable) Fill 1 day early if closed on refill date. Do not fill until: 07/17/2018. To last until: 08/16/2018. Avoid benzodiazepines within 8 hours of opioids  . oxyCODONE (OXY IR/ROXICODONE) 5 MG immediate release tablet    Sig: Take 1 tablet (5 mg total) by mouth every 6 (six) hours as needed for severe pain. Must last 30 days    Dispense:  120 tablet    Refill:  0    Chronic Pain: STOP Act (Not applicable) Fill 1 day early if closed on refill date. Do not fill until: 08/16/2018. To last until: 09/15/2018. Avoid benzodiazepines within 8 hours of opioids  . oxyCODONE (OXY IR/ROXICODONE) 5 MG immediate release tablet    Sig: Take 1 tablet (5 mg total) by mouth every 6 (six) hours as needed for severe pain. Must last 30 days    Dispense:  120 tablet    Refill:  0    Chronic Pain: STOP Act (Not applicable) Fill 1 day early if closed on refill date. Do not fill until: 09/15/2018. To last until: 10/15/2018. Avoid benzodiazepines within 8 hours of opioids   Orders:  Orders Placed This Encounter  Procedures  . KNEE INJECTION    For knee pain. Hyalgan Knee injection(s). Please order hyalgan to be available.    Standing Status:   Standing    Number of Occurrences:   5    Standing Expiration Date:   07/11/2019    Scheduling Instructions:     Procedure: Intra-articular Hyalgan Knee injection            Side(s): Right Knee     Sedation: None     TIMEFRAME: PRN procedure. (Ms. Winski will call when needed.)    Order Specific Question:   Where will this procedure be performed?    Answer:   [facility]  . Comp. Metabolic Panel (12)    With GFR. Indications:  Chronic Pain Syndrome (G89.4) & Pharmacotherapy (C78.938)    Order Specific Question:   Has the patient fasted?    Answer:   No    Order Specific Question:   CC Results    Answer:   PCP-NURSE [[id number]]  . Magnesium    Indication: Pharmacologic therapy (B01.751)    Order Specific Question:   CC Results    Answer:   PCP-NURSE [[id number]]  . Vitamin B12    Indication: Pharmacologic therapy (D78.242).    Order Specific Question:   CC Results    Answer:   PCP-NURSE [[id number]]  . Sedimentation rate    Indication: Disorder of skeletal system (M89.9)    Order Specific Question:   CC Results    Answer:   PCP-NURSE [[id number]]  . 25-Hydroxyvitamin D Lcms D2+D3  Indication: Disorder of skeletal system (M89.9).    Order Specific Question:   CC Results    Answer:   PCP-NURSE [[id number]]  . C-reactive protein    Indication: Problems influencing health status (Z78.9)    Order Specific Question:   CC Results    Answer:   PCP-NURSE [[id number]][[id number]]   Follow-up plan:   Return in 13 weeks (on 10/10/2018) for (VV), E/M (PP), in addition, PRN Procedure: (R) Hyalgan Knee inj. #1.     Considering:   Therapeutic right-sided intra-articular Hyalgan knee injections  Diagnostic intra-articular left hip joint injection  Possible left hip RFA  Diagnostic right intra-articular knee joint injection with local anesthetic and steroid  Diagnostic bilateral genicular nerve block  Possible bilateral genicular nerve RFA  Diagnostic bilateral lumbar facet block  Possible bilateral lumbar facet RFA  Diagnostic bilateral L3-4 and/or L5-S1 transforaminal ESI  Diagnostic L3-4 versus L4-5 LESI    Palliative PRN treatment(s):   Therapeutic right-sided intra-articular Hyalgan knee injections Palliative left intra-articular hip joint injection  Diagnostic left lumbar facet block #2      Recent Visits Date Type Provider Dept  05/14/18 Office Visit Barbette MerinoKing, Crystal M, NP [facility]  Showing recent visits within past 90 days  and meeting all other requirements   Today's Visits Date Type Provider Dept  07/11/18 Office Visit Delano MetzNaveira, Tyrees Chopin, MD [facility]  Showing today's visits and meeting all other requirements   Future Appointments No visits were found meeting these conditions.  Showing future appointments within next 90 days and meeting all other requirements   I discussed the assessment and treatment plan with the patient. The patient was provided an opportunity to ask questions and all were answered. The patient agreed with the plan and demonstrated an understanding of the instructions.  Patient advised to call back or seek an in-person evaluation if the symptoms or condition worsens.  Total duration of non-face-to-face encounter: 15 minutes.  Note by: Oswaldo DoneFrancisco A Breindy Meadow, MD Date: 07/11/2018; Time: 1:17 PM  Note: This dictation was prepared with Dragon dictation. Any transcriptional errors that may result from this process are unintentional.  Disclaimer:  * Given the special circumstances of the COVID-19 pandemic, the federal government has announced that the Office for Civil Rights (OCR) will exercise its enforcement discretion and will not impose penalties on physicians using telehealth in the event of noncompliance with regulatory requirements under the [employer] Insurance Portability and Accountability Act (HIPAA) in connection with the good faith provision of telehealth during the COVID-19 national public health emergency. (AMA)

## 2018-07-29 ENCOUNTER — Other Ambulatory Visit: Payer: Self-pay | Admitting: Psychiatry

## 2018-07-29 DIAGNOSIS — F5101 Primary insomnia: Secondary | ICD-10-CM

## 2018-08-01 ENCOUNTER — Encounter: Payer: Self-pay | Admitting: Licensed Clinical Social Worker

## 2018-08-01 ENCOUNTER — Other Ambulatory Visit: Payer: Self-pay

## 2018-08-01 ENCOUNTER — Ambulatory Visit (INDEPENDENT_AMBULATORY_CARE_PROVIDER_SITE_OTHER): Payer: Medicare Other | Admitting: Licensed Clinical Social Worker

## 2018-08-01 DIAGNOSIS — F330 Major depressive disorder, recurrent, mild: Secondary | ICD-10-CM | POA: Diagnosis not present

## 2018-08-01 NOTE — Progress Notes (Signed)
Comprehensive Clinical Assessment (CCA) Note  08/01/2018 Laura [name] [id number]  Visit Diagnosis:      ICD-10-CM   1. MDD (major depressive disorder), recurrent episode, mild (HCC)  F33.0       CCA Part One  Part One has been completed on paper by the patient.  (See scanned document in Chart Review)  CCA Part Two A  Intake/Chief Complaint:  CCA Intake With Chief Complaint CCA Part Two Date: 08/01/18 CCA Part Two Time: 59 Chief Complaint/Presenting Problem: "I've always kind of liked therapy because a lot of things bother me. It just always did me good. I always felt better." Patients Currently Reported Symptoms/Problems: "I don't know. I always worry. I worry about a lot of things. I don't know how to express that. I don't sleep well becaus of it." Collateral Involvement: n/a Individual's Strengths: good communication Individual's Preferences: individual therapy Individual's Abilities: good communication Type of Services Patient Feels Are Needed: individual therapy Initial Clinical Notes/Concerns: n/a  Mental Health Symptoms Depression:  Depression: Change in energy/activity, Sleep (too much or little), Worthlessness, Hopelessness, Fatigue  Mania:  Mania: N/A  Anxiety:   Anxiety: Worrying, Tension, Sleep, Restlessness, Irritability, Fatigue, Difficulty concentrating  Psychosis:  Psychosis: N/A  Trauma:  Trauma: N/A  Obsessions:  Obsessions: N/A  Compulsions:  Compulsions: N/A  Inattention:  Inattention: N/A  Hyperactivity/Impulsivity:  Hyperactivity/Impulsivity: N/A  Oppositional/Defiant Behaviors:  Oppositional/Defiant Behaviors: N/A  Borderline Personality:  Emotional Irregularity: N/A  Other Mood/Personality Symptoms:  Other Mood/Personality Symtpoms: n/a   Mental Status Exam Appearance and self-care  Stature:  Stature: Average  Weight:  Weight: Average weight  Clothing:  Clothing: Neat/clean  Grooming:  Grooming: Normal  Cosmetic use:  Cosmetic Use: None   Posture/gait:  Posture/Gait: Normal  Motor activity:  Motor Activity: Not Remarkable  Sensorium  Attention:  Attention: Normal  Concentration:  Concentration: Normal  Orientation:  Orientation: X5  Recall/memory:  Recall/Memory: Normal  Affect and Mood  Affect:  Affect: Anxious  Mood:  Mood: Anxious  Relating  Eye contact:  Eye Contact: Normal  Facial expression:  Facial Expression: Anxious  Attitude toward examiner:  Attitude Toward Examiner: Cooperative  Thought and Language  Speech flow: Speech Flow: Normal  Thought content:  Thought Content: Appropriate to mood and circumstances  Preoccupation:     Hallucinations:     Organization:     Transport planner of Knowledge:     Intelligence:  Intelligence: Average  Abstraction:  Abstraction: Normal  Judgement:  Judgement: Normal  Reality Testing:  Reality Testing: Realistic  Insight:  Insight: Fair  Decision Making:  Decision Making: Normal  Social Functioning  Social Maturity:  Social Maturity: Responsible  Social Judgement:  Social Judgement: Normal  Stress  Stressors:     Coping Ability:     Skill Deficits:     Supports:      Family and Psychosocial History: Family history Marital status: Married Number of Years Married: 47 What types of issues is patient dealing with in the relationship?: Normal marriage stuff Additional relationship information: Married one time previously. "He was an alcoholic, but I didn't realize how bad it was until he married. He'd come home late. He was always drunk. It was not a good marriage at all." Are you sexually active?: Yes What is your sexual orientation?: heterosexual Has your sexual activity been affected by drugs, alcohol, medication, or emotional stress?: n/a Does patient have children?: Yes How many children?: 2 How is patient's relationship with their children?: son, daughter: "  Great."  Childhood History:  Childhood History By whom was/is the patient raised?: Both  parents Additional childhood history information: "My grandmother did a lot of baby sitting because my parents both worked." Description of patient's relationship with caregiver when they were a child: Mom: "I loved my mom. She was so good." Dad: "he liked to drink.I loved him to pieces though." Patient's description of current relationship with people who raised him/her: Both deceased How were you disciplined when you got in trouble as a child/adolescent?: "I got spankings, things like that." Does patient have siblings?: Yes Number of Siblings: 1 Description of patient's current relationship with siblings: Sister: "We're very close." Did patient suffer any verbal/emotional/physical/sexual abuse as a child?: No Did patient suffer from severe childhood neglect?: No Has patient ever been sexually abused/assaulted/raped as an adolescent or adult?: No Was the patient ever a victim of a crime or a disaster?: No Witnessed domestic violence?: No Has patient been effected by domestic violence as an adult?: No  CCA Part Two B  Employment/Work Situation: Employment / Work Psychologist, occupational Employment situation: Retired Therapist, art is the longest time patient has a held a job?: unable to state 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 Are There Guns or Education officer, community in Your Home?: No  Education: Engineer, civil (consulting) Currently Attending: n/a Last Grade Completed: 12 Name of High School: [employer] Did Garment/textile technologist From [employer]?: Yes Did Theme park manager?: No Did Designer, television/film set?: No Did You Have Any Special Interests In School?: n/a Did You Have An Individualized Education Program (IIEP): No Did You Have Any Difficulty At [employer]?: No  Religion: Religion/Spirituality Are You A Religious Person?: Yes What is Your Religious Affiliation?: Catholic How Might This Affect Treatment?: n/a  Leisure/Recreation: Leisure / Recreation Leisure and Hobbies: "I like doing cross word  puzzles. I like doing word puzzles."  Exercise/Diet: Exercise/Diet Do You Exercise?: No Have You Gained or Lost A Significant Amount of Weight in the Past Six Months?: No Do You Follow a Special Diet?: No Do You Have Any Trouble Sleeping?: Yes Explanation of Sleeping Difficulties: trouble falling asleep  CCA Part Two C  Alcohol/Drug Use: Alcohol / Drug Use Pain Medications: SEE MAR Prescriptions: SEE MAR Over the Counter: SEE MAR History of alcohol / drug use?: No history of alcohol / drug abuse                      CCA Part Three  ASAM's:  Six Dimensions of Multidimensional Assessment  Dimension 1:  Acute Intoxication and/or Withdrawal Potential:     Dimension 2:  Biomedical Conditions and Complications:     Dimension 3:  Emotional, Behavioral, or Cognitive Conditions and Complications:     Dimension 4:  Readiness to Change:     Dimension 5:  Relapse, Continued use, or Continued Problem Potential:     Dimension 6:  Recovery/Living Environment:      Substance use Disorder (SUD)    Social Function:  Social Functioning Social Maturity: Responsible Social Judgement: Normal  Stress:  Stress Patient Takes Medications The Way The Doctor Instructed?: Yes Priority Risk: Low Acuity  Risk Assessment- Self-Harm Potential: Risk Assessment For Self-Harm Potential Thoughts of Self-Harm: No current thoughts Method: No plan Availability of Means: No access/NA Additional Comments for Self-Harm Potential: n/a  Risk Assessment -Dangerous to Others Potential: Risk Assessment For Dangerous to Others Potential Method: No Plan Availability of Means: No access or NA Intent: Vague intent or  NA Notification Required: No need or identified person Additional Comments for Danger to Others Potential: n/a  DSM5 Diagnoses: Patient Active Problem List   Diagnosis Date Noted  . Pharmacologic therapy 07/11/2018  . Disorder of skeletal system 07/11/2018  . Problems influencing  health status 07/11/2018  . MDD (major depressive disorder), recurrent episode, mild (HCC) 07/09/2018  . Primary insomnia 07/09/2018  . Bereavement 07/09/2018  . Body mass index (BMI) of 40.0-44.9 in adult (HCC) 10/09/2017  . Postmenopausal 04/06/2017  . Chronic arthralgias of knees and hips (Right) 08/18/2016  . Arthritis 07/11/2016  . Dyspnea on exertion 07/11/2016  . Kidney disease 07/11/2016  . 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 Snoring 07/11/2016  . Morbid obesity with BMI of 40.0-44.9, adult (HCC) 07/11/2016  . Opioid-induced constipation (OIC) 04/21/2016  . Osteoarthritis of knee (Right) 01/25/2016  . Diet-controlled diabetes mellitus (HCC) 10/03/2015  . GAD (generalized anxiety disorder) 09/24/2015  . Disturbance of skin sensation 07/15/2015  . Elevated sedimentation rate 07/15/2015  . Elevated C-reactive protein (CRP) 07/15/2015  . Chronic hip pain (Left) 04/06/2015  . History of TKR (total knee replacement) (Left) 02/09/2015  . History of femur fracture (Right) 02/09/2015  . 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 Lumbar foraminal stenosis (Bilateral L3-4 and L5-S1) 02/09/2015  . Chronic low back pain (Primary Area of Pain) (Bilateral) (L>R) 01/22/2015  . Chronic pain syndrome 11/25/2014  . Opioid dependence (HCC) 11/07/2014  . Lumbar spinal stenosis (5 mm Severe L3-4; 8 mm L4-5) 11/07/2014  . Lumbar spondylosis 11/07/2014  . Lumbar facet syndrome (Bilateral) (L>R) 11/07/2014  . Chronic knee pain (Right) 11/05/2014  . Opiate use (30 MME/Day) 11/05/2014  . Long term current use of opiate analgesic 11/05/2014  . Long term prescription opiate use 11/05/2014  . Encounter for therapeutic drug level monitoring 11/05/2014  . Morbid obesity (HCC) 10/27/2014  . Extreme obesity (HCC) 10/07/2014  . Type 2 diabetes mellitus (HCC) 03/19/2014  . Depressive disorder 10/14/2013  . Essential (primary)  hypertension 10/14/2013  . Insomnia secondary to chronic pain 07/09/2013  . Anxiety 07/09/2013  . GERD (gastroesophageal reflux disease) 01/18/2013  . Hypothyroidism 01/18/2013  . Hypercholesterolemia 01/18/2013  . 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 CKD (chronic kidney disease) stage 3, GFR 30-59 ml/min (HCC) 01/17/2013  . Congestive heart failure with left ventricular systolic dysfunction (HCC) 11/10/2012    Patient Centered Plan: Patient is on the following Treatment Plan(s):  Anxiety  Recommendations for Services/Supports/Treatments: Recommendations for Services/Supports/Treatments Recommendations For Services/Supports/Treatments: Individual Therapy, Medication Management  Treatment Plan Summary:    Referrals to Alternative Service(s): Referred to Alternative Service(s):   Place:   Date:   Time:    Referred to Alternative Service(s):   Place:   Date:   Time:    Referred to Alternative Service(s):   Place:   Date:   Time:    Referred to Alternative Service(s):   Place:   Date:   Time:     Heidi DachKelsey Grason [place]

## 2018-08-07 ENCOUNTER — Telehealth: Payer: Self-pay | Admitting: Psychiatry

## 2018-08-07 ENCOUNTER — Telehealth: Payer: Self-pay

## 2018-08-07 MED ORDER — QUETIAPINE FUMARATE 300 MG PO TABS: 300.0000 mg | ORAL_TABLET | Freq: Every day | ORAL | 0 refills | Status: DC

## 2018-08-07 MED ORDER — QUETIAPINE FUMARATE 300 MG PO TABS: 300.0000 mg | ORAL_TABLET | Freq: Every day | ORAL | 1 refills | Status: DC

## 2018-08-07 NOTE — Telephone Encounter (Signed)
Pt states she is dizzy on trazodone. She restarted Seroquel 300 mg. Will call back in 2 weeks.

## 2018-08-07 NOTE — Telephone Encounter (Signed)
trazodone is not working she is very dizzy in the mornings when she gets up

## 2018-08-17 ENCOUNTER — Other Ambulatory Visit: Payer: Self-pay | Admitting: Psychiatry

## 2018-08-27 ENCOUNTER — Encounter: Payer: Self-pay | Admitting: Psychiatry

## 2018-08-27 ENCOUNTER — Other Ambulatory Visit: Payer: Self-pay

## 2018-08-27 ENCOUNTER — Ambulatory Visit (INDEPENDENT_AMBULATORY_CARE_PROVIDER_SITE_OTHER): Payer: Medicare Other | Admitting: Psychiatry

## 2018-08-27 DIAGNOSIS — F3341 Major depressive disorder, recurrent, in partial remission: Secondary | ICD-10-CM | POA: Diagnosis not present

## 2018-08-27 DIAGNOSIS — Z634 Disappearance and death of family member: Secondary | ICD-10-CM

## 2018-08-27 DIAGNOSIS — F5101 Primary insomnia: Secondary | ICD-10-CM | POA: Diagnosis not present

## 2018-08-27 NOTE — Progress Notes (Signed)
Virtual Visit via Telephone Note  I connected with Laura [name] on 08/27/18 at  4:45 PM EDT by telephone and verified that I am speaking with the correct person using two identifiers.   I discussed the limitations, risks, security and privacy concerns of performing an evaluation and management service by telephone and the availability of in person appointments. I also discussed with the patient that there may be a patient responsible charge related to this service. The patient expressed understanding and agreed to proceed.   I discussed the assessment and treatment plan with the patient. The patient was provided an opportunity to ask questions and all were answered. The patient agreed with the plan and demonstrated an understanding of the instructions.   The patient was advised to call back or seek an in-person evaluation if the symptoms worsen or if the condition fails to improve as anticipated. [place] MD OP Progress Note  08/27/2018 4:49 PM Laura [name]  MRN:  [id number]  Chief Complaint:  Chief Complaint    Follow-up     HPI: Anushka is a 73 year old Caucasian female who has a history of MDD, primary insomnia, chronic pain, hyperlipidemia was evaluated by phone today.  Patient reports she is currently making progress on the current medication regimen.  Denies any significant mood symptoms.  She reports she however does struggle with some dizziness especially when she gets out of bed at night.  Sleep has been good on the combination of Seroquel and melatonin.  She is currently on Seroquel 300 mg.  She also started taking melatonin 1 mg.  Discussed with patient about the effect of Seroquel, discussed with her that it can cause her to have orthostatic hypotension.  Advised her to follow the 2-minute true when she gets out of bed or changes her position.  Also discussed with her about going down on the Seroquel to a lower dosage.  She however reports she wants to stay on the current  dosage now and does not want to make any changes.  Also advised patient to go to her primary medical doctor to reevaluate her antihypertensive medications.  She has upcoming appointment with Dr. Caryl Comes.  She will have a discussion that day.  Patient denies any suicidality, homicidality or perceptual disturbances.  Patient reports her birthday is coming up and she plans to enjoy her day.  She denies any other concerns. Visit Diagnosis:    ICD-10-CM   1. MDD (major depressive disorder), recurrent, in partial remission ([place])  F33.41   2. Primary insomnia  F51.01   3. Bereavement  Z63.4     Past Psychiatric History: I have reviewed past psychiatric history from my progress note on 06/13/2018.  Past Medical History:  Past Medical History:  Diagnosis Date  . Allergy   . Anxiety   . Arthritis, degenerative 10/08/2013   Overview:    a.  Lumbar spine/spinal stenosis/foot drop.   b.  Hands.   . Chronic kidney disease   . Depression   . 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 Major depression, single episode, in complete remission ([place]) 06/25/2015  . Memory loss, short term 03/19/2014  . Seizure ([place]) 10/07/2014  . Sleep apnea   . Thyroid disease     Past Surgical History:  Procedure Laterality Date  . ABDOMINAL HYSTERECTOMY    . CESAREAN SECTION    . HIP SURGERY Right   . REPLACEMENT TOTAL KNEE Left     Family Psychiatric History: I have reviewed family psychiatric history  from my progress note on 06/13/2018.  Family History:  Family History  Problem Relation Age of Onset  . Hypertension Mother   . Stroke Mother   . Heart attack Father   . Alcohol abuse Father   . Depression Father   . Post-traumatic stress disorder Father   . Rheum arthritis Sister   . Hypertension Sister   . Depression Sister   . Breast cancer Neg Hx     Social History: I have reviewed social history from my progress note on 06/13/2018. Social History   Socioeconomic History  .  Marital status: Married    Spouse name: Not on file  . Number of children: Not on file  . Years of education: Not on file  . Highest education level: Not on file  Occupational History    Comment: retired  Engineer, productionocial Needs  . Financial resource strain: Not hard at all  . Food insecurity    Worry: Never true    Inability: Never true  . Transportation needs    Medical: No    Non-medical: No  Tobacco Use  . Smoking status: Never Smoker  . Smokeless tobacco: Never Used  Substance and Sexual Activity  . Alcohol use: No    Alcohol/week: 0.0 standard drinks  . Drug use: No  . Sexual activity: Not Currently  Lifestyle  . Physical activity    Days per week: 0 days    Minutes per session: 0 min  . Stress: Not at all  Relationships  . Social connections    Talks on phone: More than three times a week    Gets together: Once a week    Attends religious service: Never    Active member of club or organization: No    Attends meetings of clubs or organizations: Never    Relationship status: Married  Other Topics Concern  . Not on file  Social History Narrative  . Not on file    Allergies:  Allergies  Allergen Reactions  . Bupropion   . Penicillins Rash    Metabolic Disorder Labs: No results found for: HGBA1C, MPG No results found for: PROLACTIN No results found for: CHOL, TRIG, HDL, CHOLHDL, VLDL, LDLCALC No results found for: TSH  Therapeutic Level Labs: No results found for: LITHIUM No results found for: VALPROATE No components found for:  CBMZ  Current Medications: Current Outpatient Medications  Medication Sig Dispense Refill  . amLODipine (NORVASC) 10 MG tablet TAKE 0.5 TABLETS BY MOUTH ONCE NIGHTLY    . aspirin EC 81 MG tablet Take by mouth.    . chlorthalidone (HYGROTON) 25 MG tablet TAKE 1/2 TABLET (12.5MG ) BY MOUTH EVERY DAY  11  . Cholecalciferol (VITAMIN D) 2000 UNITS tablet Take by mouth daily.     Marland Kitchen. docusate sodium (COLACE) 100 MG capsule Take 100 mg by mouth  2 (two) times daily.    . DULoxetine (CYMBALTA) 60 MG capsule Take 1 capsule (60 mg total) by mouth 2 (two) times daily. 180 capsule 0  . Fluticasone-Umeclidin-Vilant (TRELEGY ELLIPTA) 100-62.5-25 MCG/INH AEPB Inhale into the lungs.    . folic acid (FOLVITE) 1 MG tablet Take 1 mg by mouth daily.    . furosemide (LASIX) 40 MG tablet TAKE 1 TABLET ONCE DAILY    . hydroxychloroquine (PLAQUENIL) 200 MG tablet Take 200 mg by mouth daily.     Marland Kitchen. inFLIXimab (REMICADE) 100 MG injection Inject 100 mg into the vein every 8 (eight) weeks.    Marland Kitchen. levothyroxine (SYNTHROID) 50 MCG  tablet     . levothyroxine (SYNTHROID, LEVOTHROID) 200 MCG tablet TAKE 1 TABLET ONCE DAILY- ON AN EMPTY STOMACH WITH A GLASS OF WATER 30-60 MINUTES BEFORE BREAKFAST  5  . loratadine (CLARITIN) 10 MG tablet Take by mouth daily as needed.     . methotrexate (RHEUMATREX) 2.5 MG tablet TAKE 4 TABLETS (10 MG TOTAL) BY MOUTH EVERY 7 (SEVEN) DAYS WITH A MEAL  5  . metoprolol succinate (TOPROL-XL) 50 MG 24 hr tablet TAKE 1 TABLET (50 MG TOTAL) BY MOUTH ONCE DAILY.  1  . Multiple Vitamin (MULTI-VITAMINS) TABS Take by mouth.    . oxyCODONE (OXY IR/ROXICODONE) 5 MG immediate release tablet Take 1 tablet (5 mg total) by mouth every 6 (six) hours as needed for severe pain. Must last 30 days 120 tablet 0  . oxyCODONE (OXY IR/ROXICODONE) 5 MG immediate release tablet Take 1 tablet (5 mg total) by mouth every 6 (six) hours as needed for severe pain. Must last 30 days 120 tablet 0  . [START ON 09/15/2018] oxyCODONE (OXY IR/ROXICODONE) 5 MG immediate release tablet Take 1 tablet (5 mg total) by mouth every 6 (six) hours as needed for severe pain. Must last 30 days 120 tablet 0  . QUEtiapine (SEROQUEL) 300 MG tablet TAKE 1 TABLET BY MOUTH EVERYDAY AT BEDTIME 90 tablet 1  . Spacer/Aero Chamber Mouthpiece MISC 1 Units by Does not apply route every 4 (four) hours as needed (wheezing). 1 each 0  . spironolactone (ALDACTONE) 25 MG tablet TAKE 1/2 (ONE-HALF) TABLET  BY MOUTH DAILY  11  . UNABLE TO FIND cholecalciferol (vitamin D3) 125 mcg (5,000 unit) capsule  TAKE 1 CAPSULE DAILY     No current facility-administered medications for this visit.      Musculoskeletal: Strength & Muscle Tone: UTA Gait & Station: walks with walker Patient leans: N/A  Psychiatric Specialty Exam: Review of Systems  Neurological: Positive for dizziness.  All other systems reviewed and are negative.   There were no vitals taken for this visit.There is no height or weight on file to calculate BMI.  General Appearance: UTA  Eye Contact:  UTA  Speech:  Clear and Coherent  Volume:  Normal  Mood:  Euthymic  Affect:  UTA  Thought Process:  Goal Directed and Descriptions of Associations: Intact  Orientation:  Full (Time, Place, and Person)  Thought Content: Logical   Suicidal Thoughts:  No  Homicidal Thoughts:  No  Memory:  Immediate;   Fair Recent;   Fair Remote;   Fair  Judgement:  Fair  Insight:  Fair  Psychomotor Activity:  UTA  Concentration:  Concentration: Fair and Attention Span: Fair  Recall:  [employer] of Knowledge: Fair  Language: Fair  Akathisia:  No  Handed:  Right  AIMS (if indicated): Denies tremors, rigidity  Assets:  Communication Skills Desire for Improvement Social Support  ADL's:  Intact  Cognition: WNL  Sleep:  Fair   Screenings: PHQ2-9     Office Visit from 03/14/2018 in [facility] Office Visit from 06/19/2017 in [facility] Office Visit from 05/29/2017 in [facility] Office Visit from 04/18/2017 in [facility], [facility] Clinical Support from 04/13/2017 in [facility]  PHQ-2 Total Score  0  0  2  0  0       Assessment and Plan: Ziva is a 73 year old Caucasian female, married, retired, lives in [place], has a history of  depression currently in remission, primary insomnia, hypertension, congestive  heart failure, GERD, diabetes melitis, hypothyroidism, rheumatoid arthritis, chronic pain, history of knee replacement was evaluated by phone today.  Patient continues to do well except for on and off dizziness. Discussed plan as noted below.  Plan MDD-stable Cymbalta 120 mg p.o. daily in divided dosage Continue Seroquel 300 mg p.o. nightly. Patient wants to stay on this dosage of seroquel at this time although is dizzy. Discussed orthostatic hypotensive effect of seroquel.Discussed to follow 2 minute rule while changing positions. Referred for CBT with therapist here in clinic  For primary insomnia-improving Seroquel as prescribed  For bereavement -improving Referred for CBT  Since patient is dizzy- discussed reducing the dosage of Seroquel.  She however is not interested.  Also advised patient to follow-up with her primary medical doctor to reevaluate her antihypertensive medications.  She has upcoming appointment with Dr. Graciela Husbands.  Follow-up in clinic in 3 month or sooner if needed.Novemeber 12 th ,4:45 pm I have spent atleast 15 minutes non face to face with patient today. More than 50 % of the time was spent for psychoeducation and supportive psychotherapy and care coordination.  This note was generated in part or whole with voice recognition software. Voice recognition is usually quite accurate but there are transcription errors that can and very often do occur. I apologize for any typographical errors that were not detected and corrected.         Jomarie Longs, MD 08/27/2018, 4:49 PM

## 2018-08-29 ENCOUNTER — Encounter: Payer: Self-pay | Admitting: Licensed Clinical Social Worker

## 2018-08-29 ENCOUNTER — Ambulatory Visit (INDEPENDENT_AMBULATORY_CARE_PROVIDER_SITE_OTHER): Payer: Medicare Other | Admitting: Licensed Clinical Social Worker

## 2018-08-29 ENCOUNTER — Other Ambulatory Visit: Payer: Self-pay

## 2018-08-29 DIAGNOSIS — F3341 Major depressive disorder, recurrent, in partial remission: Secondary | ICD-10-CM | POA: Diagnosis not present
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
## 2018-09-08 ENCOUNTER — Other Ambulatory Visit: Payer: Self-pay | Admitting: Psychiatry

## 2018-09-08 DIAGNOSIS — F5101 Primary insomnia: Secondary | ICD-10-CM

## 2018-09-18 DIAGNOSIS — D849 Immunodeficiency, unspecified: Secondary | ICD-10-CM | POA: Insufficient documentation

## 2018-09-20 ENCOUNTER — Ambulatory Visit (INDEPENDENT_AMBULATORY_CARE_PROVIDER_SITE_OTHER): Payer: Medicare Other | Admitting: Licensed Clinical Social Worker

## 2018-09-20 ENCOUNTER — Other Ambulatory Visit: Payer: Self-pay

## 2018-09-20 DIAGNOSIS — F3341 Major depressive disorder, recurrent, in partial remission: Secondary | ICD-10-CM

## 2018-09-21 ENCOUNTER — Encounter: Payer: Self-pay | Admitting: Licensed Clinical Social Worker

## 2018-09-21 NOTE — Progress Notes (Signed)
Virtual Visit via Telephone Note  I connected with Laura [name] on 09/21/18 at  3:30 PM EDT by telephone and verified that I am speaking with the correct person using two identifiers.   I discussed the limitations, risks, security and privacy concerns of performing an evaluation and management service by telephone and the availability of in person appointments. I also discussed with the patient that there may be a patient responsible charge related to this service. The patient expressed understanding and agreed to proceed.  I discussed the assessment and treatment plan with the patient. The patient was provided an opportunity to ask questions and all were answered. The patient agreed with the plan and demonstrated an understanding of the instructions.   The patient was advised to call back or seek an in-person evaluation if the symptoms worsen or if the condition fails to improve as anticipated.  I provided 30 minutes of non-face-to-face time during this encounter.   Alden Hipp, LCSW    THERAPIST PROGRESS NOTE  Session Time: 1500  Participation Level: Active  Behavioral Response: CasualAlertAnxious  Type of Therapy: Individual Therapy  Treatment Goals addressed: Coping  Interventions: Supportive  Summary: Laura [name] is a 73 y.o. female who presents with continued symptoms related to her diagnosis. Nasim reports doing well since our last session. She reported, "I had an incident with the in-laws." She stated her daughter-in-law's family had a "family party," which she and her husband were not invited to. Her daughter-in-law told her she wasn't invited because, "you never want to come anyway." Lourdez stated she did not respond to that comment because she felt herself becoming emotional and didn't want to further upset the situation. She was unsure if that was the right decision to make in the moment and was looking for validation. LCSW validated Andraya's feelings  and highlighted the insight it took to recognize her emotions becoming heightened. LCSW encouraged Eiliana to discuss the issue with her daughter in law if it continues to bother her, and to let her know why it upset Benjamine Mola. Lennyx expressed understanding and agreement. We moved on to discussing family relationship dynamics, and how there is often a lot to consider when communicating. Yudith was able to understand all concepts presented and how they could be utilized to improve communication.   Suicidal/Homicidal: No  Therapist Response: Raul continues to work towards her tx goals but has not yet reached them. We will continue to work on improving assertive communication and managing anxiety symptoms moving forward.   Plan: Return again in 4 weeks.  Diagnosis: Axis I: MDD    Axis II: No diagnosis    Alden Hipp, LCSW 09/21/2018

## 2018-09-27 ENCOUNTER — Encounter: Payer: Self-pay | Admitting: Pain Medicine

## 2018-09-30 ENCOUNTER — Other Ambulatory Visit: Payer: Self-pay | Admitting: Psychiatry

## 2018-09-30 DIAGNOSIS — F330 Major depressive disorder, recurrent, mild: Secondary | ICD-10-CM

## 2018-09-30 NOTE — Progress Notes (Signed)
Pain Management Virtual Encounter Note - Virtual Visit via Telephone Telehealth (real-time audio visits between healthcare provider and patient).   Patient's Phone No. & Preferred Pharmacy:  [phone] (home); [phone] (mobile); (Preferred) [phone] [email or web address]  [id number] [place]- , [place] - [address] [place] [place] [id number] Phone: [phone] Fax: [phone]    Pre-screening note:  Our staff contacted Laura [name] and offered her an "in person", "face-to-face" appointment versus a telephone encounter. She indicated preferring the telephone encounter, at this time.   Reason for Virtual Visit: COVID-19*  Social distancing based on CDC and AMA recommendations.   I contacted Laura [name] on 10/01/2018 via telephone.      I clearly identified myself as Oswaldo DoneFrancisco A Hilario Robarts, MD. I verified that I was speaking with the correct person using two identifiers (Name: Laura [name], and date of birth: 02-22-1945).  Advanced Informed Consent I sought verbal advanced consent from Laura [name] for virtual visit interactions. I informed Laura [name] of possible security and privacy concerns, risks, and limitations associated with providing "not-in-person" medical evaluation and management services. I also informed Laura [name] of the availability of "in-person" appointments. Finally, I informed her that there would be a charge for the virtual visit and that she could be  personally, fully or partially, financially responsible for it. Laura [name] expressed understanding and agreed to proceed.   Historic Elements   [name] is a 73 y.o. year old, female patient evaluated today after her last encounter by our practice on 07/11/2018. 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 She also  has a past surgical history that includes Abdominal hysterectomy; Cesarean section; Replacement total knee (Left); and Hip surgery (Right). Laura [name] has a current medication list which includes the following prescription(s): amlodipine, aspirin ec, chlorthalidone, vitamin d, docusate sodium, fluticasone-umeclidin-vilant, folic acid, furosemide, hydroxychloroquine, infliximab, levothyroxine, levothyroxine, loratadine, methotrexate, metoprolol succinate, multi-vitamins, oxycodone, oxycodone, oxycodone, quetiapine, spacer/aero chamber mouthpiece, spironolactone, UNABLE TO FIND, and duloxetine. She  reports that she has never smoked. She has never used smokeless tobacco. She reports that she does not drink alcohol or use drugs. Laura [name] is allergic to bupropion and penicillins.   HPI  Today, she is being contacted for medication management.  The patient indicates doing well with the current medication regimen. No adverse reactions or side effects reported to the medications.  The patient indicates having had a flareup of the sciatica couple weeks ago when she ended up adding some Tylenol to her regimen to get under control.  However, she did not give me a call to request any assistance.  Today I have reminded her that that is the reason why I am here, to help her whenever she needs to get the pain back under control.  Today we talked about "drug holidays" and how she needs to make sure that she avoids dealing with tolerance by increasing her dose.  I also reminded her that whenever she has a flareup, she can give me a call and we may be able to prescribe for her a course of steroids that she can take for a couple days until the pain gets back under control.  He understood and accepted and at this point she is actually doing well and not  needing anything else.  Pharmacotherapy Assessment  Analgesic: Oxycodone IR 5 mg every 6 hours (20 mg/day) MME/day:30 mg/day.   Monitoring: Pharmacotherapy: No side-effects or adverse reactions reported. [place] PMP: PDMP reviewed during this encounter.       Compliance: No problems identified. Effectiveness: Clinically acceptable. Plan: Refer to "POC".  UDS:  Summary  Date Value Ref Range Status  03/14/2018 FINAL  Final    Comment:    ==================================================================== TOXASSURE SELECT 13 (MW) ==================================================================== Test                             Result       Flag       Units Drug Present and Declared for Prescription Verification   Noroxycodone                   745          EXPECTED   ng/mg creat    Noroxycodone is an expected metabolite of oxycodone. Sources of    oxycodone include scheduled prescription medications. Drug Present not Declared for Prescription Verification   Tramadol                       4129         UNEXPECTED ng/mg creat   O-Desmethyltramadol            >5000        UNEXPECTED ng/mg creat   N-Desmethyltramadol            >5000        UNEXPECTED ng/mg creat    Source of tramadol is a prescription medication.    O-desmethyltramadol and N-desmethyltramadol are expected    metabolites of tramadol. Drug Absent but Declared for Prescription Verification   Oxycodone                      Not Detected UNEXPECTED ng/mg creat    Oxycodone is almost always present in patients taking this drug    consistently.  Absence of oxycodone could be due to lapse of time    since the last dose or unusual pharmacokinetics (rapid    metabolism). ==================================================================== Test                      Result    Flag   Units      Ref Range   Creatinine              100              mg/dL       >=86>=20 ==================================================================== Declared Medications:  The flagging and interpretation on this report are based on the  following declared medications.  Unexpected results may arise from  inaccuracies in the declared medications.  **Note: The testing scope of this panel includes these medications:  Oxycodone  **Note: The testing scope of this panel does not include following  reported medications:  Quetiapine  Simvastatin  Spironolactone ==================================================================== For clinical consultation, please call [phone]. ====================================================================    Laboratory Chemistry Profile (12 mo)  Renal: No results found for requested labs within last 8760 hours.  Lab Results  Component Value Date   GFRAA 35 (L) 05/23/2017   GFRNONAA 30 (L) 05/23/2017   Hepatic: No results found for requested labs within last 8760 hours. Lab Results  Component Value Date   AST 19 07/15/2015  ALT 15 07/15/2015   Other: No results found for requested labs within last 8760 hours. Note: Above Lab results reviewed.  Imaging  Last 90 days:  No results found.  Assessment  The primary encounter diagnosis was Chronic pain syndrome. A diagnosis of Chronic low back pain (Primary Area of Pain) (Bilateral) (L>R) was also pertinent to this visit.  Plan of Care  I am having Laura [name] start on oxyCODONE and oxyCODONE. I am also having her maintain her furosemide, loratadine, metoprolol succinate, Vitamin D, spironolactone, levothyroxine, hydroxychloroquine, folic acid, docusate sodium, Multi-Vitamins, aspirin EC, chlorthalidone, methotrexate, Spacer/Aero Chamber Mouthpiece, inFLIXimab, Fluticasone-Umeclidin-Vilant, UNABLE TO FIND, amLODipine, levothyroxine, QUEtiapine, and oxyCODONE.  Pharmacotherapy (Medications Ordered): Meds ordered this encounter  Medications  . oxyCODONE (OXY  IR/ROXICODONE) 5 MG immediate release tablet    Sig: Take 1 tablet (5 mg total) by mouth every 6 (six) hours as needed for severe pain. Must last 30 days    Dispense:  120 tablet    Refill:  0    Chronic Pain: STOP Act (Not applicable) Fill 1 day early if closed on refill date. Do not fill until: 10/15/2018. To last until: 11/14/2018. Avoid benzodiazepines within 8 hours of opioids  . oxyCODONE (OXY IR/ROXICODONE) 5 MG immediate release tablet    Sig: Take 1 tablet (5 mg total) by mouth every 6 (six) hours as needed for severe pain. Must last 30 days    Dispense:  120 tablet    Refill:  0    Chronic Pain: STOP Act (Not applicable) Fill 1 day early if closed on refill date. Do not fill until: 11/14/2018. To last until: 12/14/2018. Avoid benzodiazepines within 8 hours of opioids  . oxyCODONE (OXY IR/ROXICODONE) 5 MG immediate release tablet    Sig: Take 1 tablet (5 mg total) by mouth every 6 (six) hours as needed for severe pain. Must last 30 days    Dispense:  120 tablet    Refill:  0    Chronic Pain: STOP Act (Not applicable) Fill 1 day early if closed on refill date. Do not fill until: 12/14/2018. To last until: 01/13/2019. Avoid benzodiazepines within 8 hours of opioids   Orders:  No orders of the defined types were placed in this encounter.  Follow-up plan:   Return in about 14 weeks (around 01/07/2019) for (VV), (MM).      Considering:   Therapeutic right-sided intra-articular Hyalgan knee injections  Diagnostic intra-articular left hip joint injection  Possible left hip RFA  Diagnostic right intra-articular knee joint injection with local anesthetic and steroid  Diagnostic bilateral genicular nerve block  Possible bilateral genicular nerve RFA  Diagnostic bilateral lumbar facet block  Possible bilateral lumbar facet RFA  Diagnostic bilateral L3-4 and/or L5-S1 transforaminal ESI  Diagnostic L3-4 versus L4-5 LESI    Palliative PRN treatment(s):   Therapeutic right-sided intra-articular  Hyalgan knee injections Palliative left intra-articular hip joint injection  Diagnostic left lumbar facet block #2       Recent Visits Date Type Provider Dept  07/11/18 Office Visit Milinda Pointer, MD [facility]  Showing recent visits within past 90 days and meeting all other requirements   Today's Visits Date Type Provider Dept  10/01/18 Office Visit Milinda Pointer, MD [facility]  Showing today's visits and meeting all other requirements   Future Appointments No visits were found meeting these conditions.  Showing future appointments within next 90 days and meeting all other requirements   I discussed the assessment and treatment plan  with the patient. The patient was provided an opportunity to ask questions and all were answered. The patient agreed with the plan and demonstrated an understanding of the instructions.  Patient advised to call back or seek an in-person evaluation if the symptoms or condition worsens.  Total duration of non-face-to-face encounter: 15 minutes.  Note by: Oswaldo Done, MD Date: 10/01/2018; Time: 12:05 PM  Note: This dictation was prepared with Dragon dictation. Any transcriptional errors that may result from this process are unintentional.  Disclaimer:  * Given the special circumstances of the COVID-19 pandemic, the federal government has announced that the Office for Civil Rights (OCR) will exercise its enforcement discretion and will not impose penalties on physicians using telehealth in the event of noncompliance with regulatory requirements under the [employer] Portability and Accountability Act (HIPAA) in connection with the good faith provision of telehealth during the COVID-19 national public health emergency. (AMA)

## 2018-10-01 ENCOUNTER — Ambulatory Visit: Payer: Medicare Other | Attending: Pain Medicine | Admitting: Pain Medicine

## 2018-10-01 ENCOUNTER — Other Ambulatory Visit: Payer: Self-pay

## 2018-10-01 DIAGNOSIS — M5441 Lumbago with sciatica, right side: Secondary | ICD-10-CM

## 2018-10-01 DIAGNOSIS — G894 Chronic pain syndrome: Secondary | ICD-10-CM | POA: Diagnosis not present

## 2018-10-01 DIAGNOSIS — M5442 Lumbago with sciatica, left side: Secondary | ICD-10-CM

## 2018-10-01 DIAGNOSIS — G8929 Other chronic pain: Secondary | ICD-10-CM

## 2018-10-01 MED ORDER — OXYCODONE HCL 5 MG PO TABS: 5.0000 mg | ORAL_TABLET | Freq: Four times a day (QID) | ORAL | 0 refills | Status: DC | PRN

## 2018-10-01 NOTE — Patient Instructions (Signed)
____________________________________________________________________________________________  Medication Rules  Purpose: To inform patients, and their family members, of our rules and regulations.  Applies to: All patients receiving prescriptions (written or electronic).  Pharmacy of record: Pharmacy where electronic prescriptions will be sent. If written prescriptions are taken to a different pharmacy, please inform the nursing staff. The pharmacy listed in the electronic medical record should be the one where you would like electronic prescriptions to be sent.  Electronic prescriptions: In compliance with the [place] Strengthen Opioid Misuse Prevention (STOP) Act of 2017 (Session Law 2017-74/H243), effective January 10, 2018, all controlled substances must be electronically prescribed. Calling prescriptions to the pharmacy will cease to exist.  Prescription refills: Only during scheduled appointments. Applies to all prescriptions.  NOTE: The following applies primarily to controlled substances (Opioid* Pain Medications).   Patient's responsibilities: 1. Pain Pills: Bring all pain pills to every appointment (except for procedure appointments). 2. Pill Bottles: Bring pills in original pharmacy bottle. Always bring the newest bottle. Bring bottle, even if empty. 3. Medication refills: You are responsible for knowing and keeping track of what medications you take and those you need refilled. The day before your appointment: write a list of all prescriptions that need to be refilled. The day of the appointment: give the list to the admitting nurse. Prescriptions will be written only during appointments. No prescriptions will be written on procedure days. If you forget a medication: it will not be "Called in", "Faxed", or "electronically sent". You will need to get another appointment to get these prescribed. No early refills. Do not call asking to have your prescription filled  early. 4. Prescription Accuracy: You are responsible for carefully inspecting your prescriptions before leaving our office. Have the discharge nurse carefully go over each prescription with you, before taking them home. Make sure that your name is accurately spelled, that your address is correct. Check the name and dose of your medication to make sure it is accurate. Check the number of pills, and the written instructions to make sure they are clear and accurate. Make sure that you are given enough medication to last until your next medication refill appointment. 5. Taking Medication: Take medication as prescribed. When it comes to controlled substances, taking less pills or less frequently than prescribed is permitted and encouraged. Never take more pills than instructed. Never take medication more frequently than prescribed.  6. Inform other Doctors: Always inform, all of your healthcare providers, of all the medications you take. 7. Pain Medication from other Providers: You are not allowed to accept any additional pain medication from any other Doctor or Healthcare provider. There are two exceptions to this rule. (see below) In the event that you require additional pain medication, you are responsible for notifying us, as stated below. 8. Medication Agreement: You are responsible for carefully reading and following our Medication Agreement. This must be signed before receiving any prescriptions from our practice. Safely store a copy of your signed Agreement. Violations to the Agreement will result in no further prescriptions. (Additional copies of our Medication Agreement are available upon request.) 9. Laws, Rules, & Regulations: All patients are expected to follow all Federal and State Laws, Statutes, Rules, & Regulations. Ignorance of the Laws does not constitute a valid excuse. The use of any illegal substances is prohibited. 10. Adopted CDC guidelines & recommendations: Target dosing levels will be  at or below 60 MME/day. Use of benzodiazepines** is not recommended.  Exceptions: There are only two exceptions to the rule of not   receiving pain medications from other Healthcare Providers. 1. Exception #1 (Emergencies): In the event of an emergency (i.e.: accident requiring emergency care), you are allowed to receive additional pain medication. However, you are responsible for: As soon as you are able, call our office [phone], at any time of the day or night, and leave a message stating your name, the date and nature of the emergency, and the name and dose of the medication prescribed. In the event that your call is answered by a member of our staff, make sure to document and save the date, time, and the name of the person that took your information.  2. Exception #2 (Planned Surgery): In the event that you are scheduled by another doctor or dentist to have any type of surgery or procedure, you are allowed (for a period no longer than 30 days), to receive additional pain medication, for the acute post-op pain. However, in this case, you are responsible for picking up a copy of our "Post-op Pain Management for Surgeons" handout, and giving it to your surgeon or dentist. This document is available at our office, and does not require an appointment to obtain it. Simply go to our office during business hours (Monday-Thursday from 8:00 AM to 4:00 PM) (Friday 8:00 AM to 12:00 Noon) or if you have a scheduled appointment with us, prior to your surgery, and ask for it by name. In addition, you will need to provide us with your name, name of your surgeon, type of surgery, and date of procedure or surgery.  *Opioid medications include: morphine, codeine, oxycodone, oxymorphone, hydrocodone, hydromorphone, meperidine, tramadol, tapentadol, buprenorphine, fentanyl, methadone. **Benzodiazepine medications include: diazepam (Valium), alprazolam (Xanax), clonazepam (Klonopine), lorazepam (Ativan), clorazepate  (Tranxene), chlordiazepoxide (Librium), estazolam (Prosom), oxazepam (Serax), temazepam (Restoril), triazolam (Halcion) (Last updated: 03/09/2017) ____________________________________________________________________________________________   ____________________________________________________________________________________________  Medication Recommendations and Reminders  Applies to: All patients receiving prescriptions (written and/or electronic).  Medication Rules & Regulations: These rules and regulations exist for your safety and that of others. They are not flexible and neither are we. Dismissing or ignoring them will be considered "non-compliance" with medication therapy, resulting in complete and irreversible termination of such therapy. (See document titled "Medication Rules" for more details.) In all conscience, because of safety reasons, we cannot continue providing a therapy where the patient does not follow instructions.  Pharmacy of record:   Definition: This is the pharmacy where your electronic prescriptions will be sent.   We do not endorse any particular pharmacy.  You are not restricted in your choice of pharmacy.  The pharmacy listed in the electronic medical record should be the one where you want electronic prescriptions to be sent.  If you choose to change pharmacy, simply notify our nursing staff of your choice of new pharmacy.  Recommendations:  Keep all of your pain medications in a safe place, under lock and key, even if you live alone.   After you fill your prescription, take 1 week's worth of pills and put them away in a safe place. You should keep a separate, properly labeled bottle for this purpose. The remainder should be kept in the original bottle. Use this as your primary supply, until it runs out. Once it's gone, then you know that you have 1 week's worth of medicine, and it is time to come in for a prescription refill. If you do this correctly, it  is unlikely that you will ever run out of medicine.  To make sure that the above recommendation works,   it is very important that you make sure your medication refill appointments are scheduled at least 1 week before you run out of medicine. To do this in an effective manner, make sure that you do not leave the office without scheduling your next medication management appointment. Always ask the nursing staff to show you in your prescription , when your medication will be running out. Then arrange for the receptionist to get you a return appointment, at least 7 days before you run out of medicine. Do not wait until you have 1 or 2 pills left, to come in. This is very poor planning and does not take into consideration that we may need to cancel appointments due to bad weather, sickness, or emergencies affecting our staff.  "Partial Fill": If for any reason your pharmacy does not have enough pills/tablets to completely fill or refill your prescription, do not allow for a "partial fill". You will need a separate prescription to fill the remaining amount, which we will not provide. If the reason for the partial fill is your insurance, you will need to talk to the pharmacist about payment alternatives for the remaining tablets, but again, do not accept a partial fill.  Prescription refills and/or changes in medication(s):   Prescription refills, and/or changes in dose or medication, will be conducted only during scheduled medication management appointments. (Applies to both, written and electronic prescriptions.)  No refills on procedure days. No medication will be changed or started on procedure days. No changes, adjustments, and/or refills will be conducted on a procedure day. Doing so will interfere with the diagnostic portion of the procedure.  No phone refills. No medications will be "called into the pharmacy".  No Fax refills.  No weekend refills.  No Holliday refills.  No after hours  refills.  Remember:  Business hours are:  Monday to Thursday 8:00 AM to 4:00 PM Provider's Schedule: Jacquline Terrill, MD - Appointments are:  Medication management: Monday and Wednesday 8:00 AM to 4:00 PM Procedure day: Tuesday and Thursday 7:30 AM to 4:00 PM Bilal Lateef, MD - Appointments are:  Medication management: Tuesday and Thursday 8:00 AM to 4:00 PM Procedure day: Monday and Wednesday 7:30 AM to 4:00 PM (Last update: 03/09/2017) ____________________________________________________________________________________________    

## 2018-10-06 ENCOUNTER — Inpatient Hospital Stay
Admission: EM | Admit: 2018-10-06 | Discharge: 2018-10-09 | DRG: 871 | Disposition: A | Discharge status: Home Health | Payer: Medicare Other | Attending: Internal Medicine | Admitting: Internal Medicine

## 2018-10-06 ENCOUNTER — Other Ambulatory Visit: Payer: Self-pay | Admitting: Psychiatry

## 2018-10-06 ENCOUNTER — Other Ambulatory Visit: Payer: Self-pay

## 2018-10-06 ENCOUNTER — Emergency Department: Payer: Medicare Other

## 2018-10-06 DIAGNOSIS — Z888 Allergy status to other drugs, medicaments and biological substances status: Secondary | ICD-10-CM | POA: Diagnosis not present

## 2018-10-06 DIAGNOSIS — F329 Major depressive disorder, single episode, unspecified: Secondary | ICD-10-CM | POA: Diagnosis present

## 2018-10-06 DIAGNOSIS — Z23 Encounter for immunization: Secondary | ICD-10-CM | POA: Diagnosis present

## 2018-10-06 DIAGNOSIS — Z8249 Family history of ischemic heart disease and other diseases of the circulatory system: Secondary | ICD-10-CM | POA: Diagnosis not present

## 2018-10-06 DIAGNOSIS — Z818 Family history of other mental and behavioral disorders: Secondary | ICD-10-CM | POA: Diagnosis not present

## 2018-10-06 DIAGNOSIS — I248 Other forms of acute ischemic heart disease: Secondary | ICD-10-CM | POA: Diagnosis present

## 2018-10-06 DIAGNOSIS — M069 Rheumatoid arthritis, unspecified: Secondary | ICD-10-CM | POA: Diagnosis present

## 2018-10-06 DIAGNOSIS — J181 Lobar pneumonia, unspecified organism: Secondary | ICD-10-CM | POA: Diagnosis present

## 2018-10-06 DIAGNOSIS — Z7982 Long term (current) use of aspirin: Secondary | ICD-10-CM

## 2018-10-06 DIAGNOSIS — Z880 Allergy status to penicillin: Secondary | ICD-10-CM

## 2018-10-06 DIAGNOSIS — F411 Generalized anxiety disorder: Secondary | ICD-10-CM | POA: Diagnosis present

## 2018-10-06 DIAGNOSIS — Z8261 Family history of arthritis: Secondary | ICD-10-CM

## 2018-10-06 DIAGNOSIS — J9601 Acute respiratory failure with hypoxia: Secondary | ICD-10-CM | POA: Diagnosis present

## 2018-10-06 DIAGNOSIS — Z96652 Presence of left artificial knee joint: Secondary | ICD-10-CM | POA: Diagnosis present

## 2018-10-06 DIAGNOSIS — E039 Hypothyroidism, unspecified: Secondary | ICD-10-CM | POA: Diagnosis present

## 2018-10-06 DIAGNOSIS — E876 Hypokalemia: Secondary | ICD-10-CM | POA: Diagnosis present

## 2018-10-06 DIAGNOSIS — A419 Sepsis, unspecified organism: Secondary | ICD-10-CM | POA: Diagnosis present

## 2018-10-06 DIAGNOSIS — N183 Chronic kidney disease, stage 3 (moderate): Secondary | ICD-10-CM | POA: Diagnosis present

## 2018-10-06 DIAGNOSIS — Z823 Family history of stroke: Secondary | ICD-10-CM

## 2018-10-06 DIAGNOSIS — I129 Hypertensive chronic kidney disease with stage 1 through stage 4 chronic kidney disease, or unspecified chronic kidney disease: Secondary | ICD-10-CM | POA: Diagnosis present

## 2018-10-06 DIAGNOSIS — J189 Pneumonia, unspecified organism: Secondary | ICD-10-CM | POA: Diagnosis present

## 2018-10-06 DIAGNOSIS — R0602 Shortness of breath: Secondary | ICD-10-CM | POA: Diagnosis present

## 2018-10-06 DIAGNOSIS — Z90710 Acquired absence of both cervix and uterus: Secondary | ICD-10-CM

## 2018-10-06 DIAGNOSIS — R1110 Vomiting, unspecified: Secondary | ICD-10-CM

## 2018-10-06 DIAGNOSIS — Z20828 Contact with and (suspected) exposure to other viral communicable diseases: Secondary | ICD-10-CM | POA: Diagnosis present

## 2018-10-06 DIAGNOSIS — K219 Gastro-esophageal reflux disease without esophagitis: Secondary | ICD-10-CM | POA: Diagnosis present

## 2018-10-06 DIAGNOSIS — F5101 Primary insomnia: Secondary | ICD-10-CM

## 2018-10-06 DIAGNOSIS — Z811 Family history of alcohol abuse and dependence: Secondary | ICD-10-CM | POA: Diagnosis not present

## 2018-10-06 DIAGNOSIS — D509 Iron deficiency anemia, unspecified: Secondary | ICD-10-CM | POA: Diagnosis present

## 2018-10-06 DIAGNOSIS — I480 Paroxysmal atrial fibrillation: Secondary | ICD-10-CM | POA: Diagnosis present

## 2018-10-06 DIAGNOSIS — Z79891 Long term (current) use of opiate analgesic: Secondary | ICD-10-CM

## 2018-10-06 DIAGNOSIS — F419 Anxiety disorder, unspecified: Secondary | ICD-10-CM | POA: Diagnosis present

## 2018-10-06 DIAGNOSIS — J45909 Unspecified asthma, uncomplicated: Secondary | ICD-10-CM | POA: Diagnosis present

## 2018-10-06 DIAGNOSIS — F330 Major depressive disorder, recurrent, mild: Secondary | ICD-10-CM

## 2018-10-06 DIAGNOSIS — G4730 Sleep apnea, unspecified: Secondary | ICD-10-CM | POA: Diagnosis present

## 2018-10-06 DIAGNOSIS — Z79899 Other long term (current) drug therapy: Secondary | ICD-10-CM

## 2018-10-06 LAB — CBC WITH DIFFERENTIAL/PLATELET
Abs Immature Granulocytes: 0.34 10*3/uL — ABNORMAL HIGH (ref 0.00–0.07)
Basophils Absolute: 0.1 10*3/uL (ref 0.0–0.1)
Basophils Relative: 0 %
Eosinophils Absolute: 0 10*3/uL (ref 0.0–0.5)
Eosinophils Relative: 0 %
HCT: 35.8 % — ABNORMAL LOW (ref 36.0–46.0)
Hemoglobin: 11.9 g/dL — ABNORMAL LOW (ref 12.0–15.0)
Immature Granulocytes: 1 %
Lymphocytes Relative: 4 %
Lymphs Abs: 1.1 10*3/uL (ref 0.7–4.0)
MCH: 30 pg (ref 26.0–34.0)
MCHC: 33.2 g/dL (ref 30.0–36.0)
MCV: 90.2 fL (ref 80.0–100.0)
Monocytes Absolute: 1.3 10*3/uL — ABNORMAL HIGH (ref 0.1–1.0)
Monocytes Relative: 5 %
Neutro Abs: 22.9 10*3/uL — ABNORMAL HIGH (ref 1.7–7.7)
Neutrophils Relative %: 90 %
Platelets: 207 10*3/uL (ref 150–400)
RBC: 3.97 MIL/uL (ref 3.87–5.11)
RDW: 14.8 % (ref 11.5–15.5)
Smear Review: NORMAL
WBC: 25.7 10*3/uL — ABNORMAL HIGH (ref 4.0–10.5)

## 2018-10-06 LAB — COMPREHENSIVE METABOLIC PANEL
ALT: 24 U/L (ref 0–44)
AST: 39 U/L (ref 15–41)
Albumin: 4 g/dL (ref 3.5–5.0)
Alkaline Phosphatase: 70 U/L (ref 38–126)
Anion gap: 16 — ABNORMAL HIGH (ref 5–15)
BUN: 28 mg/dL — ABNORMAL HIGH (ref 8–23)
CO2: 29 mmol/L (ref 22–32)
Calcium: 9.1 mg/dL (ref 8.9–10.3)
Chloride: 87 mmol/L — ABNORMAL LOW (ref 98–111)
Creatinine, Ser: 1.46 mg/dL — ABNORMAL HIGH (ref 0.44–1.00)
GFR calc Af Amer: 41 mL/min — ABNORMAL LOW (ref >60)
GFR calc non Af Amer: 35 mL/min — ABNORMAL LOW (ref >60)
Glucose, Bld: 175 mg/dL — ABNORMAL HIGH (ref 70–99)
Potassium: 3 mmol/L — ABNORMAL LOW (ref 3.5–5.1)
Sodium: 132 mmol/L — ABNORMAL LOW (ref 135–145)
Total Bilirubin: 1 mg/dL (ref 0.3–1.2)
Total Protein: 7 g/dL (ref 6.5–8.1)

## 2018-10-06 LAB — SARS CORONAVIRUS 2 BY RT PCR (HOSPITAL ORDER, PERFORMED IN ~~LOC~~ HOSPITAL LAB): SARS Coronavirus 2: NEGATIVE

## 2018-10-06 LAB — TROPONIN I (HIGH SENSITIVITY)
Troponin I (High Sensitivity): 50 ng/L — ABNORMAL HIGH (ref <18)
Troponin I (High Sensitivity): 41 ng/L — ABNORMAL HIGH (ref <18)

## 2018-10-06 LAB — BRAIN NATRIURETIC PEPTIDE: B Natriuretic Peptide: 973 pg/mL — ABNORMAL HIGH (ref 0.0–100.0)

## 2018-10-06 LAB — MRSA PCR SCREENING: MRSA by PCR: NEGATIVE

## 2018-10-06 LAB — LACTIC ACID, PLASMA
Lactic Acid, Venous: 1.6 mmol/L (ref 0.5–1.9)
Lactic Acid, Venous: 2.1 mmol/L (ref 0.5–1.9)

## 2018-10-06 IMAGING — CR DG CHEST 2V
1 series · 2 of 2 positions shown · non-contrast
Comparison: Radiograph [DATE].

CLINICAL DATA: Shortness of breath.

EXAM:
CHEST - 2 VIEW

[Series 1: dg chest 2 view · 0.14mm/px · 2 of 2 slices shown]
[im 1/2]
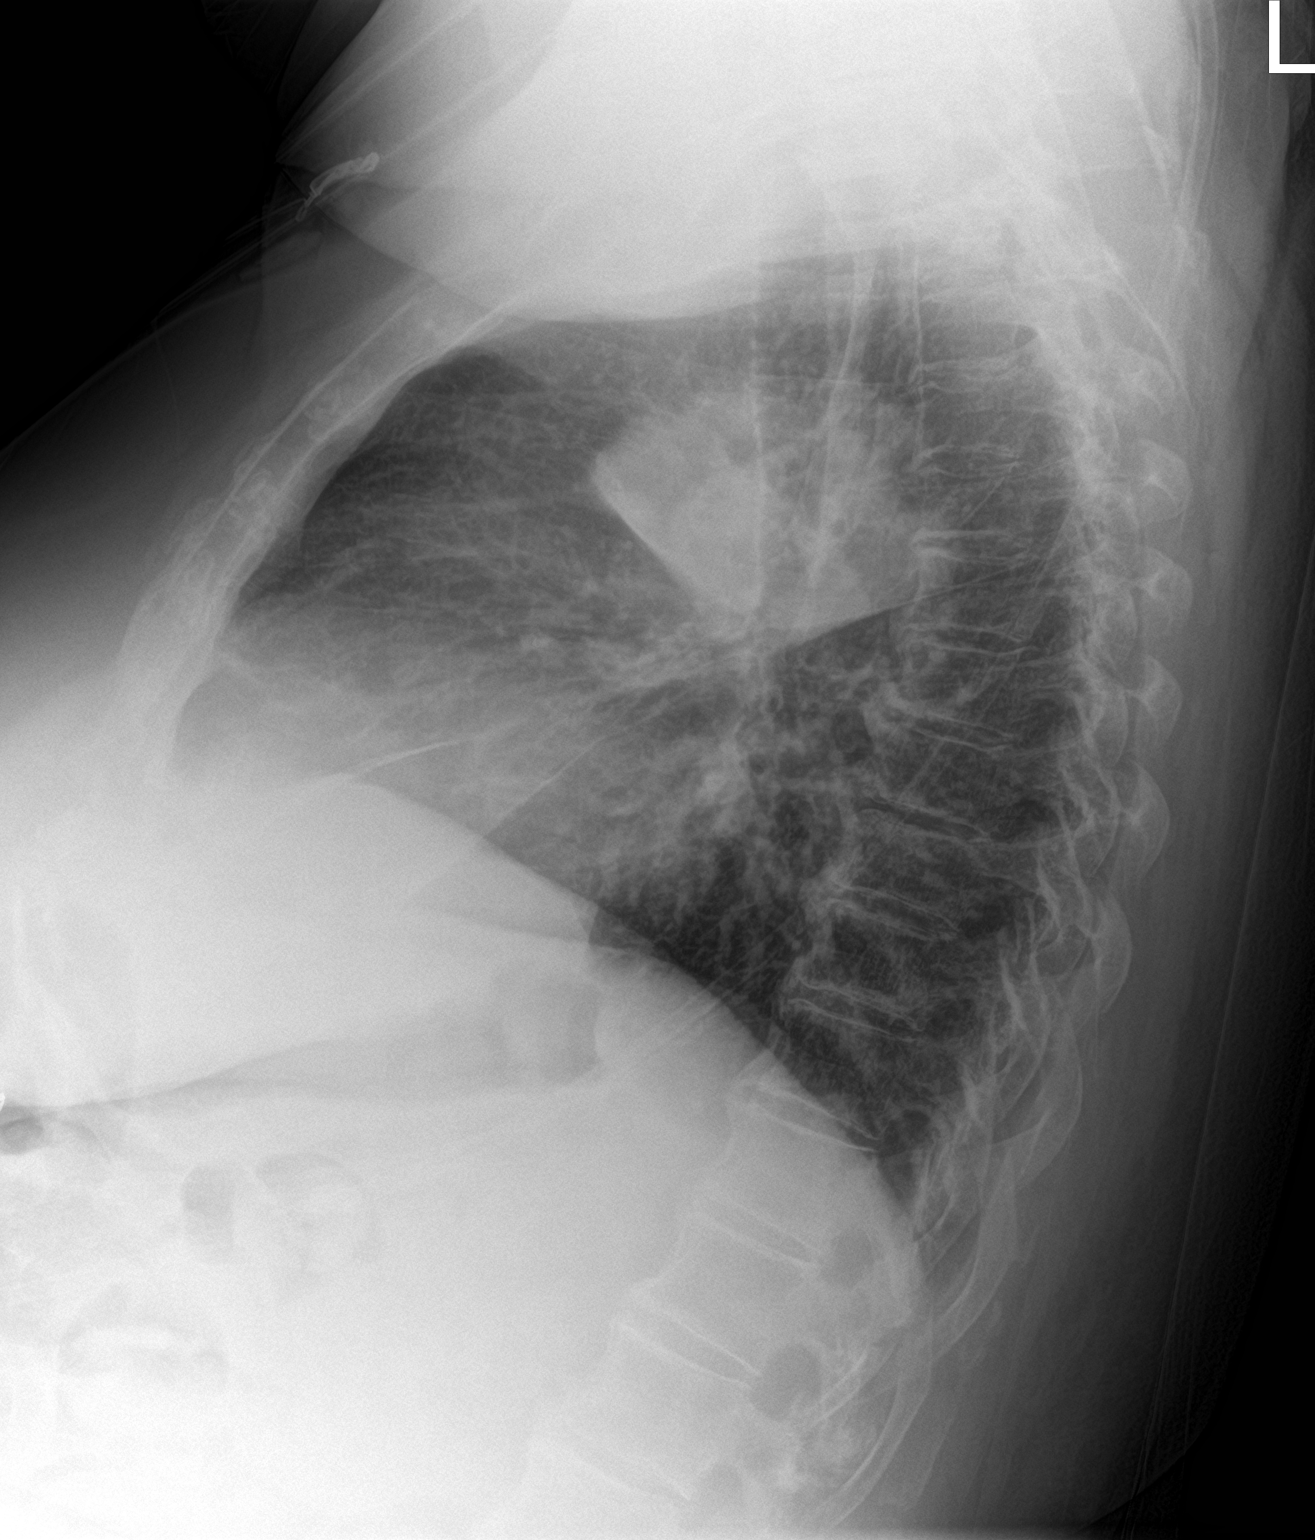
[im 2/2]
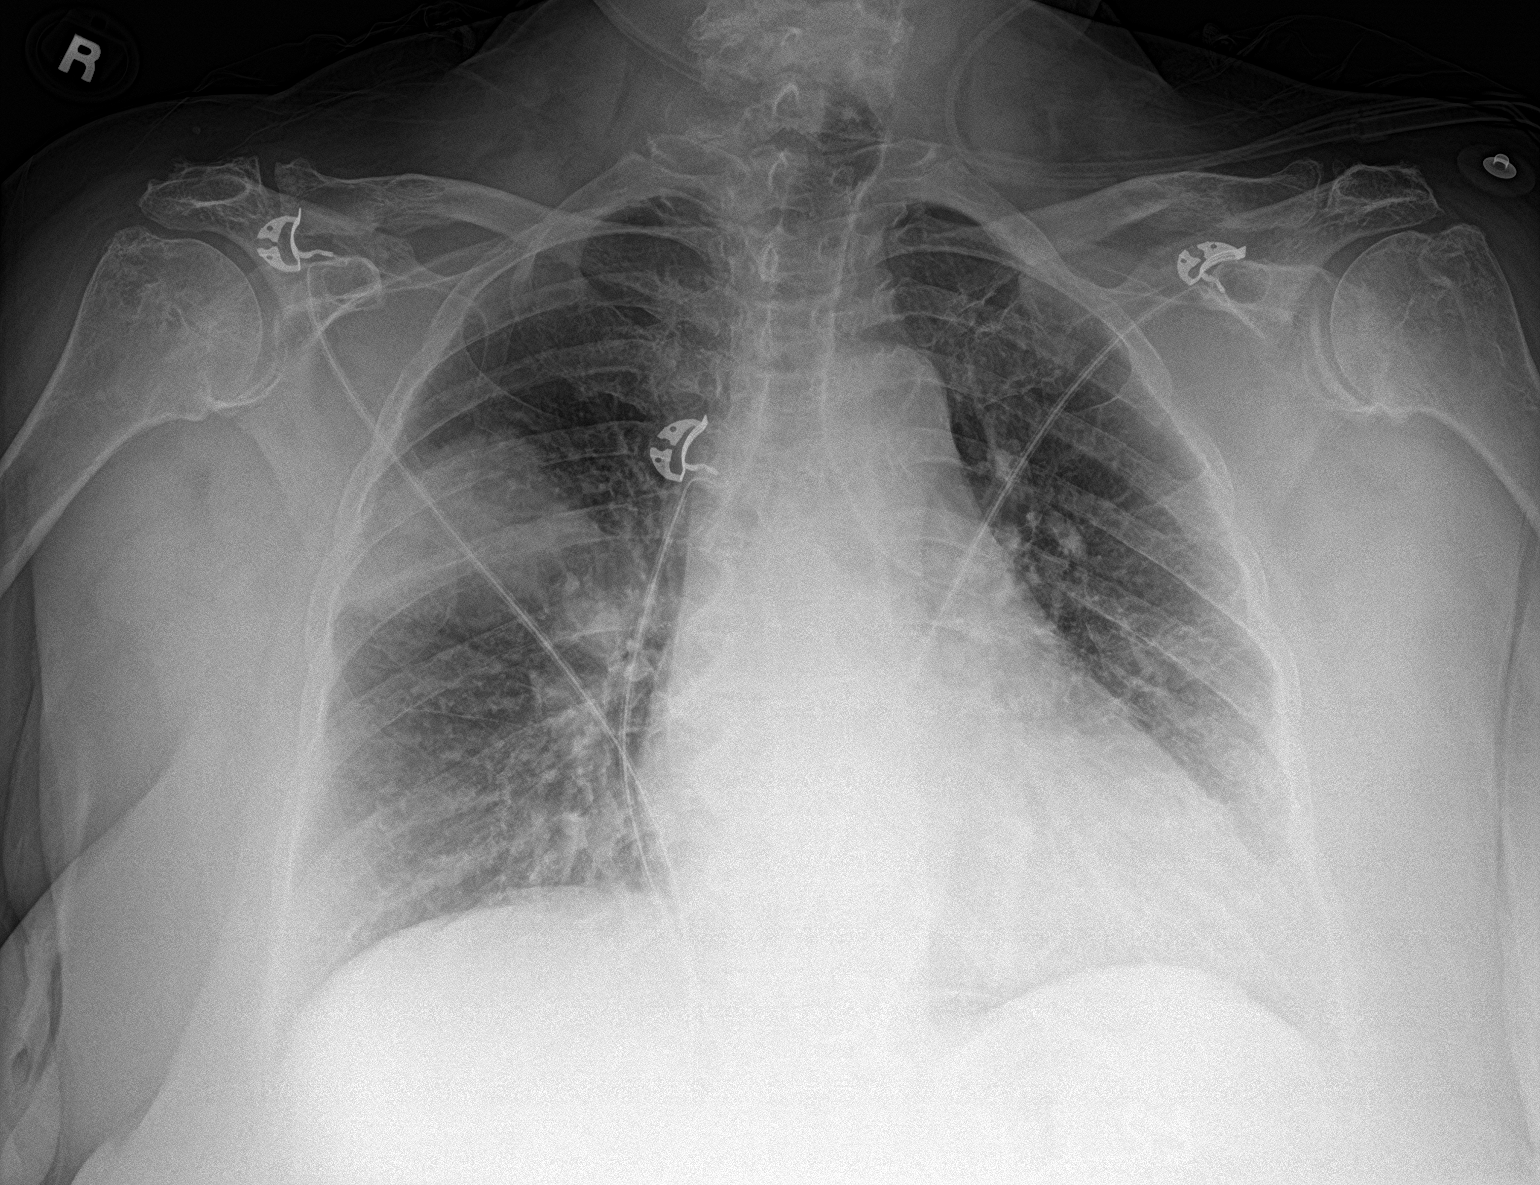

[2 of 2 positions shown; findings below may reference images not displayed]

FINDINGS: Stable cardiomegaly. New right upper lobe airspace opacity is noted
consistent with pneumonia. Left lung is clear. No pneumothorax or
pleural effusion is noted. Bony thorax is unremarkable.
IMPRESSION: New right upper lobe airspace opacity is noted consistent with
pneumonia. Followup PA and lateral chest X-ray is recommended in 3-4
weeks following trial of antibiotic therapy to ensure resolution and
exclude underlying malignancy.

## 2018-10-06 MED ORDER — SODIUM CHLORIDE 0.9 % IV SOLN
2.0000 g | Freq: Once | INTRAVENOUS | Status: AC
  Administered 2018-10-06: 2 g via INTRAVENOUS
  Filled 2018-10-06: qty 2

## 2018-10-06 MED ORDER — DULOXETINE HCL 30 MG PO CPEP
60.0000 mg | ORAL_CAPSULE | Freq: Two times a day (BID) | ORAL | Status: DC
  Administered 2018-10-06 – 2018-10-09 (×6): 60 mg via ORAL
  Filled 2018-10-06 (×6): qty 2

## 2018-10-06 MED ORDER — LORATADINE 10 MG PO TABS: 10.0000 mg | ORAL_TABLET | Freq: Every day | ORAL | Status: DC | PRN

## 2018-10-06 MED ORDER — METOPROLOL SUCCINATE ER 50 MG PO TB24
50.0000 mg | ORAL_TABLET | Freq: Every day | ORAL | Status: DC
  Administered 2018-10-07 – 2018-10-08 (×2): 50 mg via ORAL
  Filled 2018-10-06 (×2): qty 1

## 2018-10-06 MED ORDER — HYDROXYCHLOROQUINE SULFATE 200 MG PO TABS
100.0000 mg | ORAL_TABLET | Freq: Every day | ORAL | Status: DC
  Administered 2018-10-06 – 2018-10-09 (×4): 100 mg via ORAL
  Filled 2018-10-06: qty 0.5
  Filled 2018-10-06 (×3): qty 1

## 2018-10-06 MED ORDER — CHLORTHALIDONE 25 MG PO TABS
12.5000 mg | ORAL_TABLET | Freq: Every day | ORAL | Status: DC
  Administered 2018-10-07 – 2018-10-09 (×3): 12.5 mg via ORAL
  Filled 2018-10-06 (×3): qty 0.5

## 2018-10-06 MED ORDER — SODIUM CHLORIDE 0.9 % IV SOLN
2.0000 g | Freq: Two times a day (BID) | INTRAVENOUS | Status: DC
  Administered 2018-10-06: 2 g via INTRAVENOUS
  Filled 2018-10-06 (×2): qty 2

## 2018-10-06 MED ORDER — ACETAMINOPHEN 325 MG PO TABS
650.0000 mg | ORAL_TABLET | Freq: Once | ORAL | Status: AC
  Administered 2018-10-06: 650 mg via ORAL
  Filled 2018-10-06: qty 2

## 2018-10-06 MED ORDER — AMLODIPINE BESYLATE 5 MG PO TABS
2.5000 mg | ORAL_TABLET | Freq: Every day | ORAL | Status: DC
  Administered 2018-10-07 – 2018-10-09 (×3): 2.5 mg via ORAL
  Filled 2018-10-06 (×3): qty 1

## 2018-10-06 MED ORDER — POTASSIUM CHLORIDE CRYS ER 20 MEQ PO TBCR
40.0000 meq | EXTENDED_RELEASE_TABLET | Freq: Once | ORAL | Status: AC
  Administered 2018-10-08: 40 meq via ORAL
  Filled 2018-10-06: qty 2

## 2018-10-06 MED ORDER — SODIUM CHLORIDE 0.9 % IV BOLUS: 1000.0000 mL | Freq: Once | INTRAVENOUS | Status: DC

## 2018-10-06 MED ORDER — DOCUSATE SODIUM 100 MG PO CAPS
100.0000 mg | ORAL_CAPSULE | Freq: Two times a day (BID) | ORAL | Status: DC
  Administered 2018-10-06 – 2018-10-09 (×6): 100 mg via ORAL
  Filled 2018-10-06 (×6): qty 1

## 2018-10-06 MED ORDER — ASPIRIN EC 81 MG PO TBEC
81.0000 mg | DELAYED_RELEASE_TABLET | Freq: Every day | ORAL | Status: DC
  Administered 2018-10-07 – 2018-10-09 (×3): 81 mg via ORAL
  Filled 2018-10-06 (×3): qty 1

## 2018-10-06 MED ORDER — SODIUM CHLORIDE 0.9 % IV SOLN
INTRAVENOUS | Status: DC
  Administered 2018-10-06: 20:00:00 via INTRAVENOUS

## 2018-10-06 MED ORDER — SODIUM CHLORIDE 0.9 % IV SOLN
INTRAVENOUS | Status: DC | PRN
  Administered 2018-10-06: 250 mL via INTRAVENOUS
  Administered 2018-10-07: 10 mL via INTRAVENOUS

## 2018-10-06 MED ORDER — VANCOMYCIN HCL IN DEXTROSE 1-5 GM/200ML-% IV SOLN
1000.0000 mg | Freq: Once | INTRAVENOUS | Status: AC
  Administered 2018-10-06: 1000 mg via INTRAVENOUS
  Filled 2018-10-06: qty 200

## 2018-10-06 MED ORDER — VANCOMYCIN HCL IN DEXTROSE 1-5 GM/200ML-% IV SOLN: 1000.0000 mg | Freq: Once | INTRAVENOUS | Status: DC

## 2018-10-06 MED ORDER — ENOXAPARIN SODIUM 40 MG/0.4ML ~~LOC~~ SOLN
40.0000 mg | SUBCUTANEOUS | Status: DC
  Administered 2018-10-06 – 2018-10-07 (×2): 40 mg via SUBCUTANEOUS
  Filled 2018-10-06 (×2): qty 0.4

## 2018-10-06 MED ORDER — LEVOTHYROXINE SODIUM 100 MCG PO TABS
200.0000 ug | ORAL_TABLET | Freq: Every day | ORAL | Status: DC
  Administered 2018-10-07 – 2018-10-09 (×3): 200 ug via ORAL
  Filled 2018-10-06 (×3): qty 2

## 2018-10-06 MED ORDER — VITAMIN D 25 MCG (1000 UNIT) PO TABS
2000.0000 [IU] | ORAL_TABLET | Freq: Every day | ORAL | Status: DC
  Administered 2018-10-07 – 2018-10-09 (×3): 2000 [IU] via ORAL
  Filled 2018-10-06 (×4): qty 2

## 2018-10-06 MED ORDER — VANCOMYCIN HCL 1.5 G IV SOLR
1500.0000 mg | Freq: Once | INTRAVENOUS | Status: AC
  Administered 2018-10-06: 1500 mg via INTRAVENOUS
  Filled 2018-10-06: qty 1500

## 2018-10-06 MED ORDER — SPIRONOLACTONE 25 MG PO TABS: 12.5000 mg | ORAL_TABLET | Freq: Every day | ORAL | Status: DC

## 2018-10-06 MED ORDER — FOLIC ACID 1 MG PO TABS
1.0000 mg | ORAL_TABLET | Freq: Every day | ORAL | Status: DC
  Administered 2018-10-07 – 2018-10-09 (×3): 1 mg via ORAL
  Filled 2018-10-06 (×3): qty 1

## 2018-10-06 MED ORDER — VANCOMYCIN HCL 10 G IV SOLR: 1750.0000 mg | INTRAVENOUS | Status: DC

## 2018-10-06 MED ORDER — LEVOTHYROXINE SODIUM 50 MCG PO TABS
50.0000 ug | ORAL_TABLET | Freq: Every day | ORAL | Status: DC
  Administered 2018-10-07 – 2018-10-09 (×3): 50 ug via ORAL
  Filled 2018-10-06 (×3): qty 1

## 2018-10-06 MED ORDER — ADULT MULTIVITAMIN W/MINERALS CH
1.0000 | ORAL_TABLET | Freq: Every day | ORAL | Status: DC
  Administered 2018-10-07 – 2018-10-09 (×3): 1 via ORAL
  Filled 2018-10-06 (×3): qty 1

## 2018-10-06 MED ORDER — QUETIAPINE FUMARATE 300 MG PO TABS
300.0000 mg | ORAL_TABLET | Freq: Every day | ORAL | Status: DC
  Administered 2018-10-06 – 2018-10-08 (×3): 300 mg via ORAL
  Filled 2018-10-06 (×4): qty 1

## 2018-10-06 MED ORDER — FUROSEMIDE 40 MG PO TABS: 40.0000 mg | ORAL_TABLET | Freq: Every day | ORAL | Status: DC

## 2018-10-06 NOTE — Progress Notes (Signed)
PHARMACY -  BRIEF ANTIBIOTIC NOTE   Pharmacy has received consult(s) for Vancomycin and Cefepime from an ED provider.  The patient's profile has been reviewed for ht/wt/allergies/indication/available labs.    One time order(s) placed for Vancomycin 1,500mg  IV to be hung in addition to 1g IV order that has already been place to equal 2,500mg  loading dose for patient, and Cefepime 2g IV.  Further antibiotics/pharmacy consults should be ordered by admitting physician if indicated.                       Thank you, Pearla Dubonnet 10/06/2018  3:27 PM

## 2018-10-06 NOTE — ED Triage Notes (Signed)
Pt presents via EMS c/o SOB and wheezing starting yesterday. Reports emesis yesterday.

## 2018-10-06 NOTE — ED Provider Notes (Signed)
Assumed care from Dr. Joan Mayans at 3 PM. Briefly, the patient is a 73 y.o. female with PMHx of  has a past medical history of Allergy, Anxiety, Arthritis, degenerative (10/08/2013), Chronic kidney disease, Depression, Lumbar spinal stenosis with neurogenic claudication (11/07/2014), Major depression, single episode, in complete remission ([place]) (06/25/2015), Memory loss, short term (03/19/2014), Seizure ([place]) (10/07/2014), Sleep apnea, and Thyroid disease. here with SOB, wheezing, vomiting. RA on immunosuppressants. Low to mid 90s on RA here in ED. No h/o asthma or COPD. Febrile to 100.75F.   Labs Reviewed  SARS CORONAVIRUS 2 (HOSPITAL ORDER, [facility])  CULTURE, BLOOD (ROUTINE X 2)  CULTURE, BLOOD (ROUTINE X 2)  BLOOD GAS, VENOUS  BRAIN NATRIURETIC PEPTIDE  CBC WITH DIFFERENTIAL/PLATELET  COMPREHENSIVE METABOLIC PANEL  LACTIC ACID, PLASMA  LACTIC ACID, PLASMA  TROPONIN I (HIGH SENSITIVITY)    Course of Care: -Reviewed pt's CXR, which is concerning for RML PNA. Will start broad-spectrum ABX. Cultures ordered. LA pending. WBC markedly elevated c/w sepsis, protocol initiated but will be cautious w/ fluids in setting of pitting edema, h/o CHF. -Patient reassessed, is overall well-appearing.  COVID negative.  I have initiated broad-spectrum antibiotics, given Tylenol, but will be very cautious with fluids in the setting of elevated BNP.  Mid to medicine.  CRITICAL CARE Performed by: Evonnie Pat   Total critical care time: 35 minutes  Critical care time was exclusive of separately billable procedures and treating other patients.  Critical care was necessary to treat or prevent imminent or life-threatening deterioration.  Critical care was time spent personally by me on the following activities: development of treatment plan with patient and/or surrogate as well as nursing, discussions with consultants, evaluation of patient's response to treatment, examination of patient,  obtaining history from patient or surrogate, ordering and performing treatments and interventions, ordering and review of laboratory studies, ordering and review of radiographic studies, pulse oximetry and re-evaluation of patient's condition.     Duffy Bruce, MD 10/06/18 [phone]

## 2018-10-06 NOTE — ED Notes (Signed)
Unable to give report due to room assignment change.

## 2018-10-06 NOTE — Progress Notes (Signed)
CODE SEPSIS - PHARMACY COMMUNICATION  **Broad Spectrum Antibiotics should be administered within 1 hour of Sepsis diagnosis**  Time Code Sepsis Called/Page Received: 1523  Antibiotics Ordered: Vancomycin, Cefepime  Time of 1st antibiotic administration: 1635(difficulty with access)  Additional action taken by pharmacy: none  If necessary, Name of Provider/Nurse Contacted: n/a    Pearla Dubonnet ,PharmD Clinical Pharmacist  10/06/2018  4:46 PM

## 2018-10-06 NOTE — ED Notes (Signed)
Delay in report due to IV d/c. IV replaced by this RN . Floor room assigned changed when attempted to call report at [place]. Holding report at this time.

## 2018-10-06 NOTE — ED Provider Notes (Signed)
[facility] Emergency Department Provider Note  ____________________________________________   First MD Initiated Contact with Patient 10/06/18 1432     (approximate)  I have reviewed the triage vital signs and the nursing notes.  History  Chief Complaint Shortness of Breath    HPI Laura [name] is a 73 y.o. female with a history of depression, thyroid disease, RA on immunosuppressive medications, CKD, amongst others, who presents to the emergency department for shortness of breath, subjective wheezing, and two episodes of vomiting yesterday.  She reports subjective fevers and chills as well.  She denies any productive cough or chest pain.  She denies any sick contacts, diarrhea, abdominal pain.  She has no history of asthma or COPD.  On EMS arrival her oxygen was in the high 80s, placed on 2 L nasal cannula.  She is not normally on home oxygen.   Past Medical Hx Past Medical History:  Diagnosis Date  . Allergy   . Anxiety   . Arthritis, degenerative 10/08/2013   Overview:    a.  Lumbar spine/spinal stenosis/foot drop.   b.  Hands.   . Chronic kidney disease   . Depression   . 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 Major depression, single episode, in complete remission (HCC) 06/25/2015  . Memory loss, short term 03/19/2014  . Seizure (HCC) 10/07/2014  . Sleep apnea   . Thyroid disease     Problem List Patient Active Problem List   Diagnosis Date Noted  . Immunosuppression (HCC) 09/18/2018  . Pharmacologic therapy 07/11/2018  . Disorder of skeletal system 07/11/2018  . Problems influencing health status 07/11/2018  . MDD (major depressive disorder), recurrent episode, mild (HCC) 07/09/2018  . Primary insomnia 07/09/2018  . Bereavement 07/09/2018  . Body mass index (BMI) of 40.0-44.9 in adult (HCC) 10/09/2017  . Postmenopausal 04/06/2017  . Chronic arthralgias of knees and hips (Right) 08/18/2016  . Arthritis  07/11/2016  . Dyspnea on exertion 07/11/2016  . Kidney disease 07/11/2016  . 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 Snoring 07/11/2016  . Morbid obesity with BMI of 40.0-44.9, adult (HCC) 07/11/2016  . Opioid-induced constipation (OIC) 04/21/2016  . Osteoarthritis of knee (Right) 01/25/2016  . Diet-controlled diabetes mellitus (HCC) 10/03/2015  . GAD (generalized anxiety disorder) 09/24/2015  . Disturbance of skin sensation 07/15/2015  . Elevated sedimentation rate 07/15/2015  . Elevated C-reactive protein (CRP) 07/15/2015  . Chronic hip pain (Left) 04/06/2015  . History of TKR (total knee replacement) (Left) 02/09/2015  . History of femur fracture (Right) 02/09/2015  . 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 Lumbar foraminal stenosis (Bilateral L3-4 and L5-S1) 02/09/2015  . Chronic low back pain (Primary Area of Pain) (Bilateral) (L>R) 01/22/2015  . Chronic pain syndrome 11/25/2014  . Opioid dependence (HCC) 11/07/2014  . Lumbar spinal stenosis (5 mm Severe L3-4; 8 mm L4-5) 11/07/2014  . Lumbar spondylosis 11/07/2014  . Lumbar facet syndrome (Bilateral) (L>R) 11/07/2014  . Chronic knee pain (Right) 11/05/2014  . Opiate use (30 MME/Day) 11/05/2014  . Long term current use of opiate analgesic 11/05/2014  . Long term prescription opiate use 11/05/2014  . Encounter for therapeutic drug level monitoring 11/05/2014  . Morbid obesity (HCC) 10/27/2014  . Extreme obesity (HCC) 10/07/2014  . Type 2 diabetes mellitus (HCC) 03/19/2014  . Depressive disorder 10/14/2013  . Essential (primary) hypertension 10/14/2013  . Insomnia secondary to chronic pain 07/09/2013  . Anxiety 07/09/2013  .  GERD (gastroesophageal reflux disease) 01/18/2013  . Hypothyroidism 01/18/2013  . Hypercholesterolemia 01/18/2013  . 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 CKD (chronic kidney disease) stage 3, GFR 30-59 ml/min  (HCC) 01/17/2013  . Congestive heart failure with left ventricular systolic dysfunction (HCC) 11/10/2012    Past Surgical Hx Past Surgical History:  Procedure Laterality Date  . ABDOMINAL HYSTERECTOMY    . CESAREAN SECTION    . HIP SURGERY Right   . REPLACEMENT TOTAL KNEE Left     Medications Prior to Admission medications   Medication Sig Start Date End Date Taking? Authorizing Provider  amLODipine (NORVASC) 10 MG tablet TAKE 0.5 TABLETS BY MOUTH ONCE NIGHTLY 06/14/18   [provider]  aspirin EC 81 MG tablet Take by mouth.    [provider]  chlorthalidone (HYGROTON) 25 MG tablet TAKE 1/2 TABLET (12.5MG ) BY MOUTH EVERY DAY 04/06/16   [provider]  Cholecalciferol (VITAMIN D) 2000 UNITS tablet Take by mouth daily.     [provider]  docusate sodium (COLACE) 100 MG capsule Take 100 mg by mouth 2 (two) times daily.    [provider]  DULoxetine (CYMBALTA) 60 MG capsule TAKE 1 CAPSULE (60 MG TOTAL) BY MOUTH 2 (TWO) TIMES DAILY. 10/01/18   Jomarie Longs, MD  Fluticasone-Umeclidin-Vilant (TRELEGY ELLIPTA) 100-62.5-25 MCG/INH AEPB Inhale into the lungs.    [provider]  folic acid (FOLVITE) 1 MG tablet Take 1 mg by mouth daily.    [provider]  furosemide (LASIX) 40 MG tablet TAKE 1 TABLET ONCE DAILY 09/16/14   [provider]  hydroxychloroquine (PLAQUENIL) 200 MG tablet Take 100 mg by mouth daily.     [provider]  inFLIXimab (REMICADE) 100 MG injection Inject 100 mg into the vein every 8 (eight) weeks.    [provider]  levothyroxine (SYNTHROID) 50 MCG tablet  07/09/18   [provider]  levothyroxine (SYNTHROID, LEVOTHROID) 200 MCG tablet TAKE 1 TABLET ONCE DAILY- ON AN EMPTY STOMACH WITH A GLASS OF WATER 30-60 MINUTES BEFORE BREAKFAST 09/09/14   [provider]  loratadine (CLARITIN) 10 MG tablet Take by mouth daily as needed.  04/05/14   [provider]   methotrexate (RHEUMATREX) 2.5 MG tablet TAKE 4 TABLETS (10 MG TOTAL) BY MOUTH EVERY 7 (SEVEN) DAYS WITH A MEAL 03/15/16   [provider]  metoprolol succinate (TOPROL-XL) 50 MG 24 hr tablet TAKE 1 TABLET (50 MG TOTAL) BY MOUTH ONCE DAILY. 07/29/14   [provider]  Multiple Vitamin (MULTI-VITAMINS) TABS Take by mouth.    [provider]  oxyCODONE (OXY IR/ROXICODONE) 5 MG immediate release tablet Take 1 tablet (5 mg total) by mouth every 6 (six) hours as needed for severe pain. Must last 30 days 10/15/18 11/14/18  Delano Metz, MD  oxyCODONE (OXY IR/ROXICODONE) 5 MG immediate release tablet Take 1 tablet (5 mg total) by mouth every 6 (six) hours as needed for severe pain. Must last 30 days 11/14/18 12/14/18  Delano Metz, MD  oxyCODONE (OXY IR/ROXICODONE) 5 MG immediate release tablet Take 1 tablet (5 mg total) by mouth every 6 (six) hours as needed for severe pain. Must last 30 days 12/14/18 01/13/19  Delano Metz, MD  QUEtiapine (SEROQUEL) 300 MG tablet TAKE 1 TABLET BY MOUTH EVERYDAY AT BEDTIME 08/17/18   Jomarie Longs, MD  Spacer/Aero Chamber Mouthpiece MISC 1 Units by Does not apply route every 4 (four) hours as needed (wheezing). 05/24/17   Merrily Brittle, MD  spironolactone (  ALDACTONE) 25 MG tablet TAKE 1/2 (ONE-HALF) TABLET BY MOUTH DAILY 09/12/14   [provider]  UNABLE TO FIND cholecalciferol (vitamin D3) 125 mcg (5,000 unit) capsule  TAKE 1 CAPSULE DAILY    [provider]    Allergies Bupropion and Penicillins  Family Hx Family History  Problem Relation Age of Onset  . Hypertension Mother   . Stroke Mother   . Heart attack Father   . Alcohol abuse Father   . Depression Father   . Post-traumatic stress disorder Father   . Rheum arthritis Sister   . Hypertension Sister   . Depression Sister   . Breast cancer Neg Hx     Social Hx Social History   Tobacco Use  . Smoking status: Never Smoker  . Smokeless tobacco: Never  Used  Substance Use Topics  . Alcohol use: No    Alcohol/week: 0.0 standard drinks  . Drug use: No     Review of Systems  Constitutional: + for fever, chills. Eyes: Negative for visual changes. ENT: Negative for sore throat. Cardiovascular: Negative for chest pain. Respiratory: + for shortness of breath. Gastrointestinal: + vomiting  Genitourinary: Negative for dysuria. Musculoskeletal: Negative for leg swelling. Skin: Negative for rash. Neurological: Negative for for headaches.   Physical Exam  Vital Signs: ED Triage Vitals  Enc Vitals Group     BP --      Pulse Rate 10/06/18 1436 99     Resp 10/06/18 1436 (!) 28     Temp 10/06/18 1436 (!) 100.4 F (38 C)     Temp Source 10/06/18 1436 Oral     SpO2 10/06/18 1436 92 %     Weight 10/06/18 1439 272 lb (123.4 kg)     Height --      Head Circumference --      Peak Flow --      Pain Score 10/06/18 1439 0     Pain Loc --      Pain Edu? --      Excl. in GC? --     Constitutional: Alert and oriented. Febrile.  Head: Normocephalic. Atraumatic. Eyes: Conjunctivae clear. Sclera anicteric. Nose: No congestion. No rhinorrhea. Mouth/Throat: Mucous membranes are moist.  Neck: No stridor.   Cardiovascular: Normal rate, regular rhythm. No murmurs. Extremities well perfused. Respiratory: Mild tachypnea. Speaking in full sentences. Coarse scattered lung sounds, difficult due to body habitus. Satting low to mid 90s on RA. Gastrointestinal: Soft. Non-tender. Non-distended.  Musculoskeletal: Bilateral, symmetric pitting edema to the mid shins. Neurologic:  Normal speech and language. No gross focal neurologic deficits are appreciated.  Skin: Skin is warm, dry and intact. No rash noted. Psychiatric: Mood and affect are appropriate for situation.   Radiology  CXR: personally reviewed, lobar PNA.    Procedures  Procedure(s) performed (including critical care):  .Critical Care Performed by: Miguel Aschoff., MD Authorized  by: Miguel Aschoff., MD   Critical care provider statement:    Critical care time (minutes):  45   Critical care was necessary to treat or prevent imminent or life-threatening deterioration of the following conditions:  Sepsis   Critical care was time spent personally by me on the following activities:  Discussions with consultants, evaluation of patient's response to treatment, examination of patient, ordering and performing treatments and interventions, ordering and review of laboratory studies, ordering and review of radiographic studies, pulse oximetry, re-evaluation of patient's condition, obtaining history from patient or surrogate and review of old charts  Initial Impression / Assessment and Plan / ED Course  73 y.o. female with a history of depression, thyroid disease, RA on immunosuppressive medications, CKD, amongst others, who presents to the emergency department for shortness of breath, subjective wheezing, and two episodes of vomiting yesterday. Found to be febrile on arrival. Placed on [place] by EMS for oxygen high 80s. Does not normally wear supplemental O2.  Ddx: PNA, bronchitis, viral process, COVID, HF exacerbation (though less likely, given more infectious type presentation)  Plan: labs, including cultures, lactate, XR, COVID swab  Labs thus far reveal leukocytosis to 25.7.  Chest x-ray with lobar pneumonia.  Will order antibiotics, anticipate admission.  Final Clinical Impression(s) / ED Diagnosis  Final diagnoses:  Shortness of breath  Vomiting in adult  Community acquired pneumonia of right lung, unspecified part of lung       Note:  This document was prepared using Dragon voice recognition software and may include unintentional dictation errors.   Lilia Pro., MD 10/06/18 [phone]

## 2018-10-06 NOTE — H&P (Signed)
[facility] [place] [facility]   PATIENT NAME: Laura [name]    MR#:  [id number]  DATE OF BIRTH:  31-Dec-1945  DATE OF ADMISSION:  10/06/2018  PRIMARY CARE PHYSICIAN: Lynnea FerrierKlein, Bert J III, MD   REQUESTING/REFERRING PHYSICIAN: Shaune PollackIsaacs, Cameron  CHIEF COMPLAINT:   Chief Complaint  Patient presents with  . Shortness of Breath  Cough.  HISTORY OF PRESENT ILLNESS:  Laura [name]  is a 73 y.o. female with a known history of anxiety, depression, hypothyroidism, spinal stenosis with neurogenic claudication, chronic kidney disease stage III and rheumatoid arthritis on immunosuppressants who presented to the emergency room with complaints of shortness of breath and cough and fevers.  Patient found to have temperature of 100.4 in the emergency room.  Laboratory studies revealed evidence of leukocytosis with white count of 25,000.  Lactic acid level was high of 1.6.  Chest x-ray done revealed new right upper lobe airspace disease consistent with pneumonia.  Follow-up PA and lateral chest x-ray recommended in 3 to 4 weeks after treatment with antibiotics to ensure resolution and exclude underlying malignancy.  COVID test done was negative.  Patient diagnosed with sepsis secondary to pneumonia.  Started on broad-spectrum IV antibiotics with vancomycin and cefepime.  Medical service called to admit patient for further evaluation.  PAST MEDICAL HISTORY:   Past Medical History:  Diagnosis Date  . Allergy   . Anxiety   . Arthritis, degenerative 10/08/2013   Overview:    a.  Lumbar spine/spinal stenosis/foot drop.   b.  Hands.   . Chronic kidney disease   . Depression   . 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 Major depression, single episode, in complete remission (HCC) 06/25/2015  . Memory loss, short term 03/19/2014  . Seizure (HCC) 10/07/2014  . Sleep apnea   . Thyroid disease     PAST SURGICAL HISTORY:   Past Surgical History:  Procedure  Laterality Date  . ABDOMINAL HYSTERECTOMY    . CESAREAN SECTION    . HIP SURGERY Right   . REPLACEMENT TOTAL KNEE Left     SOCIAL HISTORY:   Social History   Tobacco Use  . Smoking status: Never Smoker  . Smokeless tobacco: Never Used  Substance Use Topics  . Alcohol use: No    Alcohol/week: 0.0 standard drinks    FAMILY HISTORY:   Family History  Problem Relation Age of Onset  . Hypertension Mother   . Stroke Mother   . Heart attack Father   . Alcohol abuse Father   . Depression Father   . Post-traumatic stress disorder Father   . Rheum arthritis Sister   . Hypertension Sister   . Depression Sister   . Breast cancer Neg Hx     DRUG ALLERGIES:   Allergies  Allergen Reactions  . Bupropion   . Penicillins Rash    REVIEW OF SYSTEMS:   Review of Systems  Constitutional: Positive for fever. Negative for chills and weight loss.  HENT: Negative for hearing loss and tinnitus.   Eyes: Negative for blurred vision and double vision.  Respiratory: Positive for cough, sputum production and shortness of breath.   Cardiovascular: Negative for chest pain and palpitations.  Gastrointestinal: Negative for abdominal pain, nausea and vomiting.  Genitourinary: Negative for dysuria and urgency.  Musculoskeletal: Negative for myalgias and neck pain.  Skin: Negative for itching and rash.  Neurological: Negative for dizziness and headaches.  Psychiatric/Behavioral: Negative for depression and hallucinations.    MEDICATIONS  AT HOME:   Prior to Admission medications   Medication Sig Start Date End Date Taking? Authorizing Provider  amLODipine (NORVASC) 10 MG tablet TAKE 0.5 TABLETS BY MOUTH ONCE NIGHTLY 06/14/18   [provider]  aspirin EC 81 MG tablet Take by mouth.    [provider]  chlorthalidone (HYGROTON) 25 MG tablet TAKE 1/2 TABLET (12.5MG ) BY MOUTH EVERY DAY 04/06/16   [provider]  Cholecalciferol (VITAMIN D) 2000 UNITS tablet Take by  mouth daily.     [provider]  docusate sodium (COLACE) 100 MG capsule Take 100 mg by mouth 2 (two) times daily.    [provider]  DULoxetine (CYMBALTA) 60 MG capsule TAKE 1 CAPSULE (60 MG TOTAL) BY MOUTH 2 (TWO) TIMES DAILY. 10/01/18   Jomarie Longs, MD  Fluticasone-Umeclidin-Vilant (TRELEGY ELLIPTA) 100-62.5-25 MCG/INH AEPB Inhale into the lungs.    [provider]  folic acid (FOLVITE) 1 MG tablet Take 1 mg by mouth daily.    [provider]  furosemide (LASIX) 40 MG tablet TAKE 1 TABLET ONCE DAILY 09/16/14   [provider]  hydroxychloroquine (PLAQUENIL) 200 MG tablet Take 100 mg by mouth daily.     [provider]  inFLIXimab (REMICADE) 100 MG injection Inject 100 mg into the vein every 8 (eight) weeks.    [provider]  levothyroxine (SYNTHROID) 50 MCG tablet  07/09/18   [provider]  levothyroxine (SYNTHROID, LEVOTHROID) 200 MCG tablet TAKE 1 TABLET ONCE DAILY- ON AN EMPTY STOMACH WITH A GLASS OF WATER 30-60 MINUTES BEFORE BREAKFAST 09/09/14   [provider]  loratadine (CLARITIN) 10 MG tablet Take by mouth daily as needed.  04/05/14   [provider]  methotrexate (RHEUMATREX) 2.5 MG tablet TAKE 4 TABLETS (10 MG TOTAL) BY MOUTH EVERY 7 (SEVEN) DAYS WITH A MEAL 03/15/16   [provider]  metoprolol succinate (TOPROL-XL) 50 MG 24 hr tablet TAKE 1 TABLET (50 MG TOTAL) BY MOUTH ONCE DAILY. 07/29/14   [provider]  Multiple Vitamin (MULTI-VITAMINS) TABS Take by mouth.    [provider]  oxyCODONE (OXY IR/ROXICODONE) 5 MG immediate release tablet Take 1 tablet (5 mg total) by mouth every 6 (six) hours as needed for severe pain. Must last 30 days 10/15/18 11/14/18  Delano Metz, MD  oxyCODONE (OXY IR/ROXICODONE) 5 MG immediate release tablet Take 1 tablet (5 mg total) by mouth every 6 (six) hours as needed for severe pain. Must last 30 days 11/14/18 12/14/18  Delano Metz, MD  oxyCODONE (OXY IR/ROXICODONE) 5 MG immediate release tablet Take 1 tablet (5 mg total) by mouth every 6 (six) hours as needed for severe pain. Must last 30 days 12/14/18 01/13/19  Delano Metz, MD  QUEtiapine (SEROQUEL) 300 MG tablet TAKE 1 TABLET BY MOUTH EVERYDAY AT BEDTIME 08/17/18   Jomarie Longs, MD  Spacer/Aero Chamber Mouthpiece MISC 1 Units by Does not apply route every 4 (four) hours as needed (wheezing). 05/24/17   Merrily Brittle, MD  spironolactone (ALDACTONE) 25 MG tablet TAKE 1/2 (ONE-HALF) TABLET BY MOUTH DAILY 09/12/14   [provider]  UNABLE TO FIND cholecalciferol (vitamin D3) 125 mcg (5,000 unit) capsule  TAKE 1 CAPSULE DAILY    [provider]      VITAL SIGNS:  Blood pressure (!) 132/99, pulse 96, temperature (!) 100.4 F (38 C), temperature source Oral, resp. rate 19, weight 123.4 kg, SpO2 98 %.  PHYSICAL EXAMINATION:  Physical Exam  GENERAL:  73 y.o.-year-old  patient lying in the bed with no acute distress.  EYES: Pupils equal, round, reactive to light and accommodation. No scleral icterus. Extraocular muscles intact.  HEENT: Head atraumatic, normocephalic. Oropharynx and nasopharynx clear.  NECK:  Supple, no jugular venous distention. No thyroid enlargement, no tenderness.  LUNGS: Rhonchi on the right lung.  no wheezing, rales,rhonchi or crepitation. No use of accessory muscles of respiration.  CARDIOVASCULAR: S1, S2 normal. No murmurs, rubs, or gallops.  ABDOMEN: Soft, nontender, nondistended. Bowel sounds present. No organomegaly or mass.  EXTREMITIES: No pedal edema, cyanosis, or clubbing.  NEUROLOGIC: Cranial nerves II through XII are intact. Muscle strength 5/5 in all extremities. Sensation intact. Gait not checked.  PSYCHIATRIC: The patient is alert and oriented x 3.  SKIN: No obvious rash, lesion, or ulcer.   LABORATORY PANEL:   CBC Recent Labs  Lab 10/06/18 1444  WBC 25.7*  HGB 11.9*  HCT 35.8*  PLT 207    ------------------------------------------------------------------------------------------------------------------  Chemistries  Recent Labs  Lab 10/06/18 1444  NA 132*  K 3.0*  CL 87*  CO2 29  GLUCOSE 175*  BUN 28*  CREATININE 1.46*  CALCIUM 9.1  AST 39  ALT 24  ALKPHOS 70  BILITOT 1.0   ------------------------------------------------------------------------------------------------------------------  Cardiac Enzymes No results for input(s): TROPONINI in the last 168 hours. ------------------------------------------------------------------------------------------------------------------  RADIOLOGY:  Dg Chest 2 View  Result Date: 10/06/2018 CLINICAL DATA:  Shortness of breath. EXAM: CHEST - 2 VIEW COMPARISON:  Radiograph of May 23, 2017. FINDINGS: Stable cardiomegaly. New right upper lobe airspace opacity is noted consistent with pneumonia. Left lung is clear. No pneumothorax or pleural effusion is noted. Bony thorax is unremarkable. IMPRESSION: New right upper lobe airspace opacity is noted consistent with pneumonia. Followup PA and lateral chest X-ray is recommended in 3-4 weeks following trial of antibiotic therapy to ensure resolution and exclude underlying malignancy. Electronically Signed   By: Lupita Raider M.D.   On: 10/06/2018 15:38      IMPRESSION AND PLAN:  Patient is a 73 year old female with history of hypothyroidism, hypertension, chronic kidney disease stage III and rheumatoid arthritis on asthma suppressants being admitted for management of sepsis secondary to pneumonia  1.  Sepsis secondary to right upper lobe pneumonia Patient with leukocytosis with white count of 25,000.  Fevers with temperature 100.4. Hemodynamically stable otherwise. Continue broad-spectrum IV antibiotics with IV vancomycin and cefepime with pharmacy to assist with dosing. Very gentle IV fluid hydration with normal saline at rate of 50 cc an hour to prevent fluid overload. Holding off  on home dose of diuretics including Lasix and spironolactone. Supplemental oxygen.  Wean off as tolerated  2.  Hypokalemia Replaced.  Follow-up on repeat levels in a.m.  3.  Chronic kidney disease stage III Renal function fairly stable.  Follow-up on renal function in a.m.  4.  History of rheumatoid arthritis Holding off on methotrexate for now in the setting of ongoing infectious process.  5.  Hypothyroidism Continue Synthroid.  TSH level in a.m.  DVT prophylaxis; Lovenox   All the records are reviewed and case discussed with ED provider. Management plans discussed with the patient, family ; daughter and they are in agreement.  CODE STATUS: Full code  TOTAL TIME TAKING CARE OF THIS PATIENT: 61 minutes.    Paylin Hailu M.D on 10/06/2018 at 5:36 PM  Between 7am to 6pm - Pager - [phone]  After 6pm go to [email or web address] - Social research officer, government  [employer]  [phone]  CC: Primary care physician; Adin Hector, MD   Note: This dictation was prepared with Dragon dictation along with smaller phrase technology. Any transcriptional errors that result from this process are unintentional.

## 2018-10-06 NOTE — Progress Notes (Deleted)
Care Alignment Note  Advanced Directives Documents (Living Will, Power of Attorney) currently in the EHR no advanced directives documents available .  Has the patient discussed their wishes with their family/healthcare power of attorney no.  What does the patient/decision maker understand about their medical condition and the natural course of their disease.  UTI.  Chronic large ventral hernia.  Chronic kidney disease stage III.  PAD status post right AKA.  What is the patient/decision maker's biggest fear or concern for the future pain and suffering   What is the most important goal for this patient should their health condition worsen maintenance of function.  Current   Code Status: Not on file  Current code status has been reviewed/updated.  Time spent:18 minutes

## 2018-10-06 NOTE — ED Notes (Signed)
Called lab to collect 2nd set of blood cultures. Multiple attempts to collect without success. Chuck from lab states will send someone however may be awhile.

## 2018-10-06 NOTE — Progress Notes (Addendum)
Pharmacy Antibiotic Note  Laura [name] is a 73 y.o. female admitted on 10/06/2018 with sepsis secondary to right upper lobe pneumonia. Patient with past medical history significant for CKD Stage III, anxiety, depression, hypothyroidism, spinal stenosis, and rheumatoid arthritis requiring immunosuppressants. Pharmacy has been consulted for cefepime and vancomycin dosing.  Plan: Cefepime 2g IV Q12hr. Patient with penicillin allergy (rash) currently tolerating cefepime infusion in the ED.   Vancomycin 2500mg  ordered in ED (1500mg  x 1 and 1000mg  x 1). Will order vancomycin 1750mg  IV Q48hr (AUC 478.3). MRSA PCR ordered. Will monitor serum creatinine every 24-48 hours while on vancomycin.   Weight: 272 lb (123.4 kg)  Temp (24hrs), Avg:100.4 F (38 C), Min:100.4 F (38 C), Max:100.4 F (38 C)  Recent Labs  Lab 10/06/18 1444  WBC 25.7*  CREATININE 1.46*  LATICACIDVEN 1.6    Estimated Creatinine Clearance: 44.6 mL/min (A) (by C-G formula based on SCr of 1.46 mg/dL (H)).    Allergies  Allergen Reactions  . Bupropion   . Penicillins Rash    Antimicrobials this admission: Cefepime 9/26 >>  Vancomycin 9/26 >>   Dose adjustments this admission: N/A  Microbiology results: 9/26 BCx: pending  9/26 MRSA PCR: pending 9/26 COVID: negative   Thank you for allowing pharmacy to be a part of this patient's care.  Simpson,Michael L 10/06/2018 5:49 PM

## 2018-10-07 LAB — BASIC METABOLIC PANEL
Anion gap: 10 (ref 5–15)
BUN: 25 mg/dL — ABNORMAL HIGH (ref 8–23)
CO2: 30 mmol/L (ref 22–32)
Calcium: 8.6 mg/dL — ABNORMAL LOW (ref 8.9–10.3)
Chloride: 97 mmol/L — ABNORMAL LOW (ref 98–111)
Creatinine, Ser: 1.23 mg/dL — ABNORMAL HIGH (ref 0.44–1.00)
GFR calc Af Amer: 50 mL/min — ABNORMAL LOW (ref >60)
GFR calc non Af Amer: 43 mL/min — ABNORMAL LOW (ref >60)
Glucose, Bld: 204 mg/dL — ABNORMAL HIGH (ref 70–99)
Potassium: 2.5 mmol/L — CL (ref 3.5–5.1)
Sodium: 137 mmol/L (ref 135–145)

## 2018-10-07 LAB — CBC
HCT: 33.4 % — ABNORMAL LOW (ref 36.0–46.0)
Hemoglobin: 11.1 g/dL — ABNORMAL LOW (ref 12.0–15.0)
MCH: 29.8 pg (ref 26.0–34.0)
MCHC: 33.2 g/dL (ref 30.0–36.0)
MCV: 89.5 fL (ref 80.0–100.0)
Platelets: 180 10*3/uL (ref 150–400)
RBC: 3.73 MIL/uL — ABNORMAL LOW (ref 3.87–5.11)
RDW: 14.6 % (ref 11.5–15.5)
WBC: 18.7 10*3/uL — ABNORMAL HIGH (ref 4.0–10.5)
nRBC: 0 % (ref 0.0–0.2)

## 2018-10-07 LAB — TSH: TSH: 0.383 u[IU]/mL (ref 0.350–4.500)

## 2018-10-07 LAB — PROCALCITONIN: Procalcitonin: 0.91 ng/mL

## 2018-10-07 LAB — POTASSIUM: Potassium: 3 mmol/L — ABNORMAL LOW (ref 3.5–5.1)

## 2018-10-07 LAB — MAGNESIUM: Magnesium: 2.1 mg/dL (ref 1.7–2.4)

## 2018-10-07 LAB — LACTIC ACID, PLASMA: Lactic Acid, Venous: 1.4 mmol/L (ref 0.5–1.9)

## 2018-10-07 MED ORDER — SODIUM CHLORIDE 0.9 % IV SOLN
500.0000 mg | INTRAVENOUS | Status: DC
  Administered 2018-10-07 – 2018-10-08 (×2): 500 mg via INTRAVENOUS
  Filled 2018-10-07 (×2): qty 500

## 2018-10-07 MED ORDER — POTASSIUM CHLORIDE CRYS ER 20 MEQ PO TBCR
40.0000 meq | EXTENDED_RELEASE_TABLET | ORAL | Status: AC
  Administered 2018-10-07 (×2): 40 meq via ORAL
  Filled 2018-10-07 (×2): qty 2

## 2018-10-07 MED ORDER — DOXYLAMINE SUCCINATE (SLEEP) 25 MG PO TABS
25.0000 mg | ORAL_TABLET | Freq: Every evening | ORAL | Status: DC | PRN
  Administered 2018-10-07: 25 mg via ORAL
  Filled 2018-10-07 (×2): qty 1

## 2018-10-07 MED ORDER — IPRATROPIUM-ALBUTEROL 0.5-2.5 (3) MG/3ML IN SOLN
3.0000 mL | Freq: Four times a day (QID) | RESPIRATORY_TRACT | Status: DC | PRN
  Administered 2018-10-07 – 2018-10-08 (×2): 3 mL via RESPIRATORY_TRACT
  Filled 2018-10-07 (×3): qty 3

## 2018-10-07 MED ORDER — PNEUMOCOCCAL VAC POLYVALENT 25 MCG/0.5ML IJ INJ
0.5000 mL | INJECTION | INTRAMUSCULAR | Status: AC
  Administered 2018-10-09: 0.5 mL via INTRAMUSCULAR
  Filled 2018-10-07: qty 0.5

## 2018-10-07 MED ORDER — SODIUM CHLORIDE 0.9 % IV SOLN
1.0000 g | INTRAVENOUS | Status: DC
  Administered 2018-10-07 – 2018-10-09 (×3): 1 g via INTRAVENOUS
  Filled 2018-10-07 (×2): qty 10
  Filled 2018-10-07 (×2): qty 1
  Filled 2018-10-07: qty 10

## 2018-10-07 MED ORDER — OXYCODONE HCL 5 MG PO TABS
5.0000 mg | ORAL_TABLET | Freq: Four times a day (QID) | ORAL | Status: DC | PRN
  Administered 2018-10-07 – 2018-10-09 (×4): 5 mg via ORAL
  Filled 2018-10-07 (×5): qty 1

## 2018-10-07 NOTE — Evaluation (Signed)
Physical Therapy Evaluation Patient Details Name: Laura [name] MRN: [id number] DOB: 10/03/1945 Today's Date: 10/07/2018   History of Present Illness  Patient is a 73 y/o female that presents with shortness of breath, found to have community acquired pneumonia. Her troponins have slowly been decreasing, no ACS suspected per MD -- favoring demand ischemia.  Clinical Impression  Patient presents secondary to shortness of breath, is on 2L of O2 this session, typically no supplemental O2 required. She was noted to have increased shortness of breath with even short distance ambulation this date, decreasing in SpO2 from 99% at rest to 82% with exertion. She required prolonged time to complete transfers and short distance ambulation, and generally fatigued quite quickly even with there-ex this date. She would likely benefit from SNF placement at discharge to increase her functional mobility.     Follow Up Recommendations SNF    Equipment Recommendations  Rolling walker with 5" wheels    Recommendations for Other Services       Precautions / Restrictions Precautions Precautions: Fall Restrictions Weight Bearing Restrictions: No      Mobility  Bed Mobility Overal bed mobility: Needs Assistance Bed Mobility: Supine to Sit;Sit to Supine     Supine to sit: Mod assist Sit to supine: Mod assist;+2 for physical assistance   General bed mobility comments: Patient required assistance to manage torso and LEs while transitioning Supine <--> sit  Transfers Overall transfer level: Needs assistance Equipment used: 4-wheeled walker Transfers: Sit to/from Stand Sit to Stand: From elevated surface;Min assist         General transfer comment: Patient able to complete transfers with minimal assistance from elevated bed position.  Ambulation/Gait Ambulation/Gait assistance: Min assist Gait Distance (Feet): 30 Feet Assistive device: 4-wheeled walker       General Gait Details: Anterior  trunk lean throughout gait, noted to have O2 sats decreased to 82% on 2L of O2 while ambulating even short distance.  Stairs            Wheelchair Mobility    Modified Rankin (Stroke Patients Only)       Balance Overall balance assessment: Needs assistance Sitting-balance support: No upper extremity supported;Bilateral upper extremity supported Sitting balance-Leahy Scale: Good     Standing balance support: Bilateral upper extremity supported Standing balance-Leahy Scale: Fair                               Pertinent Vitals/Pain Pain Assessment: Faces Faces Pain Scale: Hurts a little bit Pain Location: Knees/hips Pain Descriptors / Indicators: Aching Pain Intervention(s): Limited activity within patient's tolerance;Monitored during session    Home Living Family/patient expects to be discharged to:: Private residence Living Arrangements: Spouse/significant other Available Help at Discharge: Family;Available 24 hours/day Type of Home: House Home Access: Stairs to enter   [employer] of Steps: "a few" Home Layout: One level Home Equipment: Walker - 4 wheels      Prior Function Level of Independence: Independent with assistive device(s)         Comments: Patient has been ambulating in the home with 4WW with no recent falls.     Hand Dominance        Extremity/Trunk Assessment   Upper Extremity Assessment Upper Extremity Assessment: Overall WFL for tasks assessed    Lower Extremity Assessment Lower Extremity Assessment: Generalized weakness       Communication   Communication: No difficulties  Cognition Arousal/Alertness: Awake/alert Behavior During Therapy: [facility]  for tasks assessed/performed Overall Cognitive Status: Within Functional Limits for tasks assessed                                        General Comments      Exercises General Exercises - Lower Extremity Ankle Circles/Pumps: AROM;10  reps;Both Long Arc Quad: AROM;15 reps;Both Heel Slides: AROM;Both;15 reps Hip Flexion/Marching: AROM;Seated;Both;15 reps Mini-Sqauts: Other (comment)(Sit to stands x 5)   Assessment/Plan    PT Assessment Patient needs continued PT services  PT Problem List Decreased strength;Decreased mobility;Decreased activity tolerance;Cardiopulmonary status limiting activity;Decreased knowledge of use of DME;Decreased balance       PT Treatment Interventions DME instruction;Therapeutic exercise;Balance training;Gait training;Stair training;Neuromuscular re-education;Cognitive remediation;Functional mobility training;Therapeutic activities    PT Goals (Current goals can be found in the Care Plan section)  Acute Rehab PT Goals Patient Stated Goal: To return home safely PT Goal Formulation: With patient Time For Goal Achievement: 10/21/18 Potential to Achieve Goals: Good    Frequency Min 2X/week   Barriers to discharge        Co-evaluation               AM-PAC PT "6 Clicks" Mobility  Outcome Measure Help needed turning from your back to your side while in a flat bed without using bedrails?: A Lot Help needed moving from lying on your back to sitting on the side of a flat bed without using bedrails?: A Lot Help needed moving to and from a bed to a chair (including a wheelchair)?: A Lot Help needed standing up from a chair using your arms (e.g., wheelchair or bedside chair)?: A Little Help needed to walk in hospital room?: A Little Help needed climbing 3-5 steps with a railing? : A Lot 6 Click Score: 14    End of Session Equipment Utilized During Treatment: Gait belt;Oxygen Activity Tolerance: Patient tolerated treatment well Patient left: in bed;with call bell/phone within reach;with bed alarm set Nurse Communication: Mobility status PT Visit Diagnosis: Unsteadiness on feet (R26.81)    Time: 7989-2119 PT Time Calculation (min) (ACUTE ONLY): 28 min   Charges:   PT  Evaluation $PT Eval Moderate Complexity: 1 Mod PT Treatments $Therapeutic Exercise: 8-22 mins     Royce Macadamia PT, DPT, CSCS      10/07/2018, 3:39 PM

## 2018-10-07 NOTE — Progress Notes (Signed)
Md notified of pt's request for sleeping med, orders given.

## 2018-10-07 NOTE — Progress Notes (Addendum)
[place] [facility] [place] NAME: Hattye Siegfried    MR#:  [id number]  DATE OF BIRTH:  [birth year]  SUBJECTIVE:   Patient states she is feeling a little bit better today. She remains short of breath. She denies any chest pain.  REVIEW OF SYSTEMS:  Review of Systems  Constitutional: Negative for chills and fever.  HENT: Negative for congestion and sore throat.   Eyes: Negative for blurred vision and double vision.  Respiratory: Positive for cough and shortness of breath.   Cardiovascular: Negative for chest pain and palpitations.  Gastrointestinal: Negative for nausea and vomiting.  Genitourinary: Negative for dysuria and urgency.  Musculoskeletal: Negative for back pain and neck pain.  Neurological: Negative for dizziness and headaches.  Psychiatric/Behavioral: Negative for depression. The patient is not nervous/anxious.     DRUG ALLERGIES:   Allergies  Allergen Reactions  . Bupropion   . Penicillins Rash   VITALS:  Blood pressure (!) 142/80, pulse (!) 114, temperature 98.6 F (37 C), temperature source Oral, resp. rate 20, height 5\' 4"  (1.626 m), weight (!) 139.2 kg, SpO2 98 %. PHYSICAL EXAMINATION:  Physical Exam  GENERAL:  Laying in the bed with no acute distress.  HEENT: Head atraumatic, normocephalic. Pupils equal, round, reactive to light and accommodation. No scleral icterus. Extraocular muscles intact. Oropharynx and nasopharynx clear.  NECK:  Supple, no jugular venous distention. No thyroid enlargement. LUNGS: + Scattered rhonchi throughout the right lung, +expiratory wheezing. Nasal cannula in place.  Able to speak in full sentences. No use of accessory muscles of respiration.  CARDIOVASCULAR: Tachycardic, regular rhythm, S1, S2 normal. No murmurs, rubs, or gallops.  ABDOMEN: Soft, nontender, nondistended. Bowel sounds present.  EXTREMITIES: No pedal edema, cyanosis, or clubbing.  NEUROLOGIC: CN 2-12 intact, no focal  deficits. +global weakness. Sensation intact throughout. Gait not checked.  PSYCHIATRIC: The patient is alert and oriented x 3.  SKIN: No obvious rash, lesion, or ulcer.  LABORATORY PANEL:  Female CBC Recent Labs  Lab 10/07/18 0516  WBC 18.7*  HGB 11.1*  HCT 33.4*  PLT 180   ------------------------------------------------------------------------------------------------------------------ Chemistries  Recent Labs  Lab 10/06/18 1444 10/07/18 0516  NA 132* 137  K 3.0* 2.5*  CL 87* 97*  CO2 29 30  GLUCOSE 175* 204*  BUN 28* 25*  CREATININE 1.46* 1.23*  CALCIUM 9.1 8.6*  MG  --  2.1  AST 39  --   ALT 24  --   ALKPHOS 70  --   BILITOT 1.0  --    RADIOLOGY:  Dg Chest 2 View  Result Date: 10/06/2018 CLINICAL DATA:  Shortness of breath. EXAM: CHEST - 2 VIEW COMPARISON:  Radiograph of May 23, 2017. FINDINGS: Stable cardiomegaly. New right upper lobe airspace opacity is noted consistent with pneumonia. Left lung is clear. No pneumothorax or pleural effusion is noted. Bony thorax is unremarkable. IMPRESSION: New right upper lobe airspace opacity is noted consistent with pneumonia. Followup PA and lateral chest X-ray is recommended in 3-4 weeks following trial of antibiotic therapy to ensure resolution and exclude underlying malignancy. Electronically Signed   By: Marijo Conception M.D.   On: 10/06/2018 15:38   ASSESSMENT AND PLAN:   Acute hypoxic respiratory failure secondary to community-acquired pneumonia- meeting sepsis criteria on admission with fever, tachypnea, tachycardia, leukocytosis, and lactic acidosis.  Sepsis is improving. Remains on 2L O2 this morning. -Switch antibiotics to ceftriaxone and azithromycin -Blood cultures with no growth to date -Wean O2 as  able -PT consulted -Needs repeat CXR in 3-4 weeks to ensure resolution  Hypokalemia- K remains low today -Replete -Recheck potassium this afternoon  Elevated troponin- likely demand ischemia in the setting of sepsis.   Troponin trended down.  No active chest pain. -Continue to monitor  Hypertension- BP overall well-controlled -Continue metoprolol, norvasc, and chlorthalidone -Holding home Lasix and spironolactone for now, may restart in the morning  CKD III- creatinine at baseline -Avoid nephrotoxic agents -Monitor  History of rheumatoid arthritis -Holding home methotrexate in the setting of active infection  Hypothyroidism- TSH normal this admission -Continue home Synthroid  Iron deficiency anemia- hemoglobin close to baseline. No active bleeding. - Anemia panel ordered  All the records are reviewed and case discussed with Care Management/Social Worker. Management plans discussed with the patient, family and they are in agreement.  CODE STATUS: Full Code  TOTAL TIME TAKING CARE OF THIS PATIENT: 35 minutes.   More than 50% of the time was spent in counseling/coordination of care: YES  POSSIBLE D/C IN 1-2 DAYS, DEPENDING ON CLINICAL CONDITION.   Jinny Blossom Nemesis Rainwater M.D on 10/07/2018 at 10:33 AM  Between 7am to 6pm - Pager - [phone]  After 6pm go to [email or web address] - password [email or web address] [facility]  [facility] [place] [facility]  Office  [phone]  CC: Primary care physician; Lynnea Ferrier, MD  Note: This dictation was prepared with Dragon dictation along with smaller phrase technology. Any transcriptional errors that result from this process are unintentional.

## 2018-10-08 ENCOUNTER — Inpatient Hospital Stay: Payer: Medicare Other

## 2018-10-08 ENCOUNTER — Encounter: Payer: Self-pay | Admitting: Radiology

## 2018-10-08 LAB — BASIC METABOLIC PANEL
Anion gap: 11 (ref 5–15)
BUN: 21 mg/dL (ref 8–23)
CO2: 32 mmol/L (ref 22–32)
Calcium: 8.7 mg/dL — ABNORMAL LOW (ref 8.9–10.3)
Chloride: 98 mmol/L (ref 98–111)
Creatinine, Ser: 1.07 mg/dL — ABNORMAL HIGH (ref 0.44–1.00)
GFR calc Af Amer: 59 mL/min — ABNORMAL LOW (ref >60)
GFR calc non Af Amer: 51 mL/min — ABNORMAL LOW (ref >60)
Glucose, Bld: 136 mg/dL — ABNORMAL HIGH (ref 70–99)
Potassium: 2.7 mmol/L — CL (ref 3.5–5.1)
Sodium: 141 mmol/L (ref 135–145)

## 2018-10-08 LAB — FOLATE: Folate: 40 ng/mL (ref >5.9)

## 2018-10-08 LAB — CBC
HCT: 33.8 % — ABNORMAL LOW (ref 36.0–46.0)
Hemoglobin: 10.6 g/dL — ABNORMAL LOW (ref 12.0–15.0)
MCH: 29.5 pg (ref 26.0–34.0)
MCHC: 31.4 g/dL (ref 30.0–36.0)
MCV: 94.2 fL (ref 80.0–100.0)
Platelets: 115 10*3/uL — ABNORMAL LOW (ref 150–400)
RBC: 3.59 MIL/uL — ABNORMAL LOW (ref 3.87–5.11)
RDW: 14.6 % (ref 11.5–15.5)
WBC: 8.3 10*3/uL (ref 4.0–10.5)
nRBC: 0 % (ref 0.0–0.2)

## 2018-10-08 LAB — IRON AND TIBC
Iron: 16 ug/dL — ABNORMAL LOW (ref 28–170)
Saturation Ratios: 6 % — ABNORMAL LOW (ref 10.4–31.8)
TIBC: 254 ug/dL (ref 250–450)
UIBC: 238 ug/dL

## 2018-10-08 LAB — HIV ANTIBODY (ROUTINE TESTING W REFLEX): HIV Screen 4th Generation wRfx: NONREACTIVE

## 2018-10-08 LAB — PROCALCITONIN: Procalcitonin: 0.32 ng/mL

## 2018-10-08 LAB — VITAMIN B12: Vitamin B-12: 1442 pg/mL — ABNORMAL HIGH (ref 180–914)

## 2018-10-08 LAB — MAGNESIUM: Magnesium: 2.3 mg/dL (ref 1.7–2.4)

## 2018-10-08 LAB — POTASSIUM: Potassium: 3.9 mmol/L (ref 3.5–5.1)

## 2018-10-08 LAB — FERRITIN: Ferritin: 188 ng/mL (ref 11–307)

## 2018-10-08 IMAGING — CT CT ANGIO CHEST
2 of 6 series · 17 of 46 positions shown · IV contrast (APPLIED)
Comparison: No priors.

CLINICAL DATA: 73-year-old female with history of tachycardia and
atrial fibrillation. Suspected pulmonary embolism.

EXAM:
CT ANGIOGRAPHY CHEST WITH CONTRAST
TECHNIQUE: Multidetector CT imaging of the chest was performed using the
standard protocol during bolus administration of intravenous
contrast. Multiplanar CT image reconstructions and MIPs were
obtained to evaluate the vascular anatomy.
CONTRAST:  75mL OMNIPAQUE IOHEXOL 350 MG/ML SOLN

[Series 5: thins · axial · 0.68mm/px · z∈[-332,-80]mm · 14 of 278 slices shown]
[im 13/278  lung]
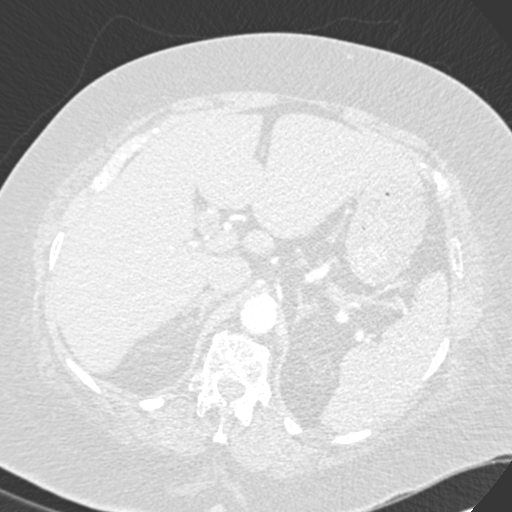
[im 37/278  soft-tissue]
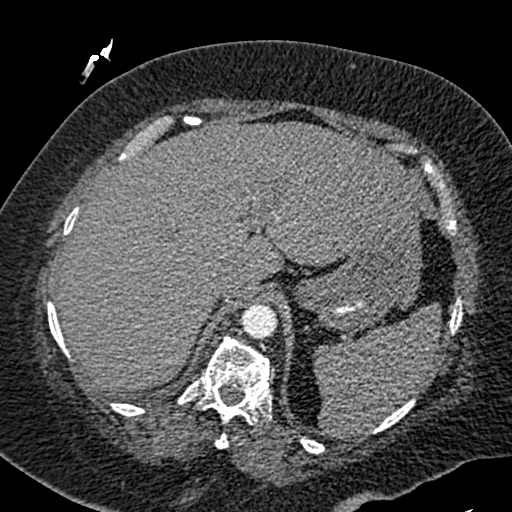
[im 49/278  lung]
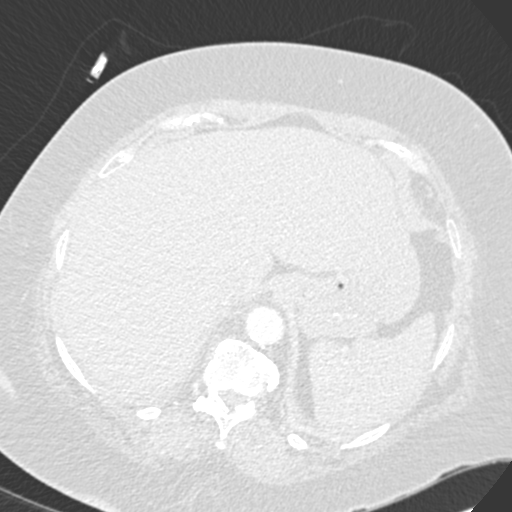
[im 73/278  soft-tissue]
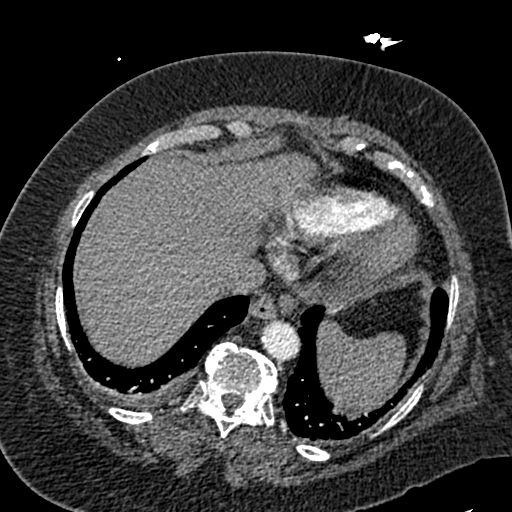
[im 97/278  lung]
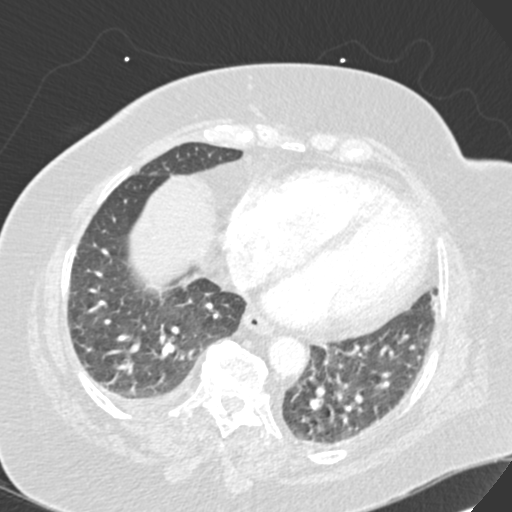
[im 109/278  soft-tissue]
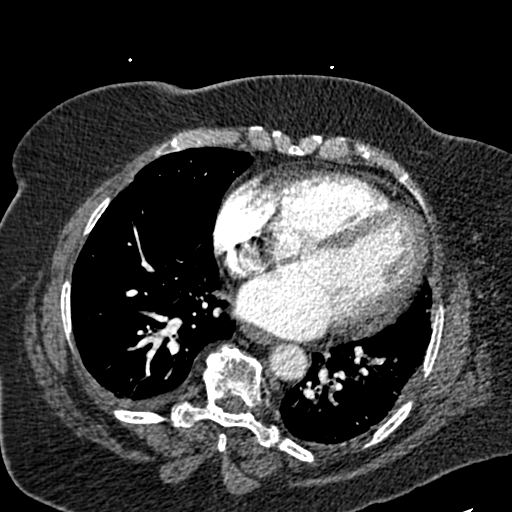
[im 133/278  lung]
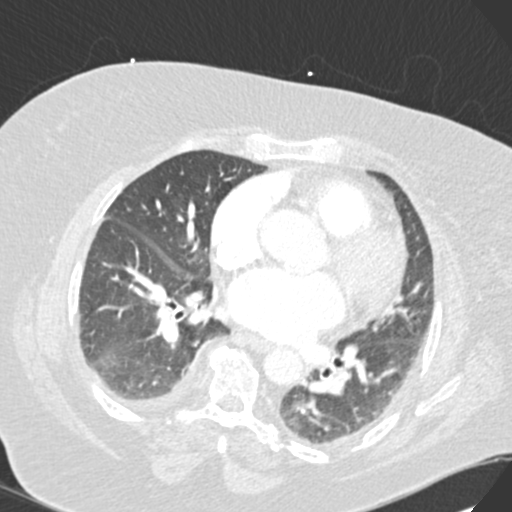
[im 145/278  soft-tissue]
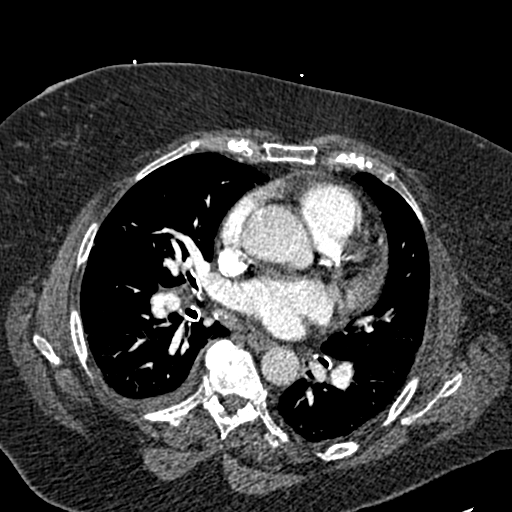
[im 169/278  lung]
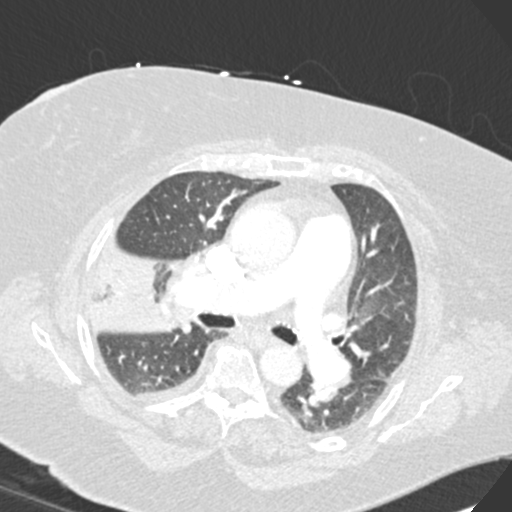
[im 181/278  soft-tissue]
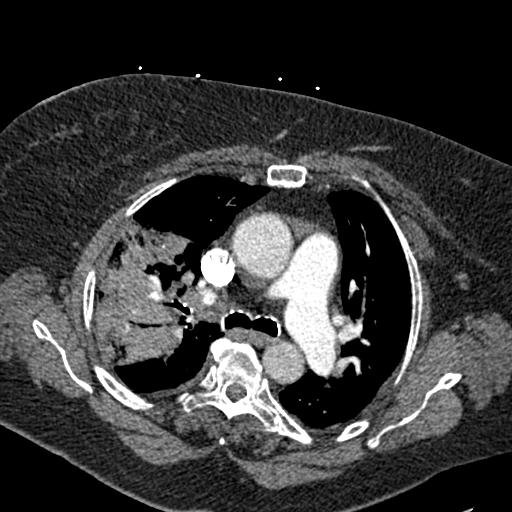
[im 205/278  lung]
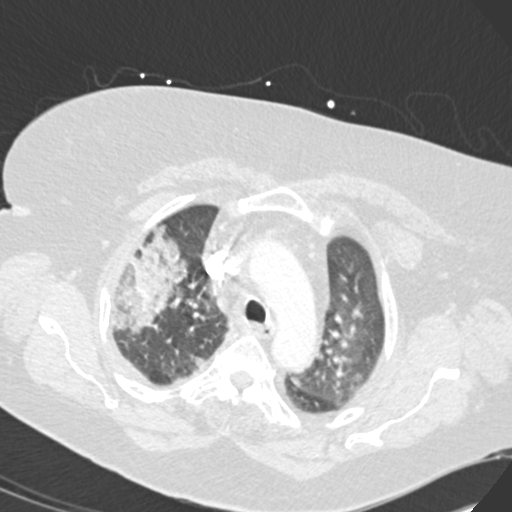
[im 229/278  soft-tissue]
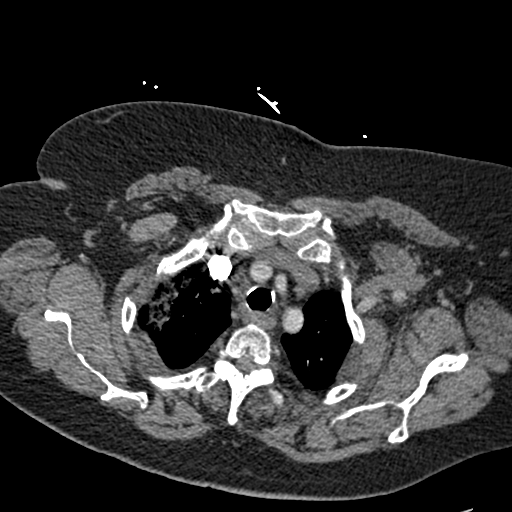
[im 241/278  lung]
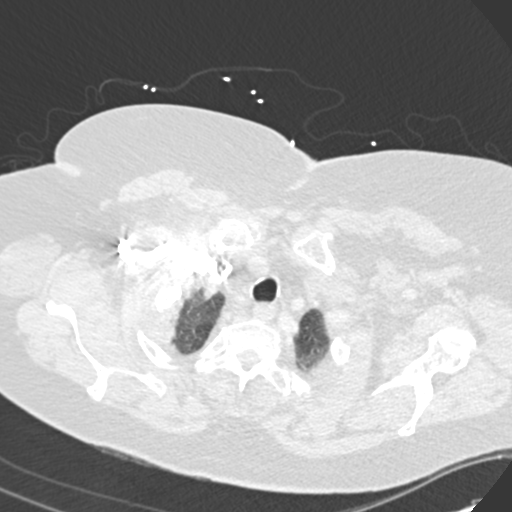
[im 265/278  soft-tissue]
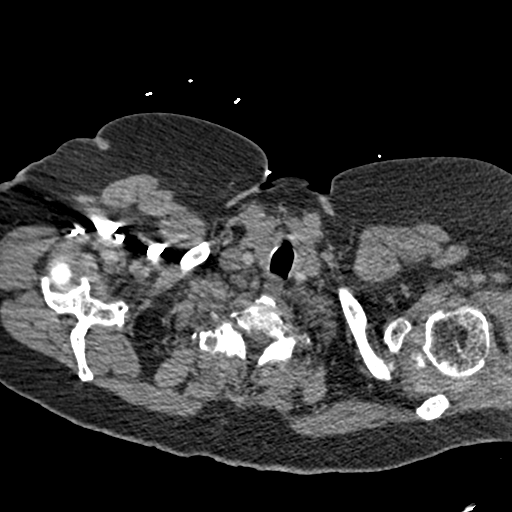

[Series 7: coronal mpr · coronal · 0.54mm/px · 3 of 92 slices shown]
[im 23/92  soft-tissue]
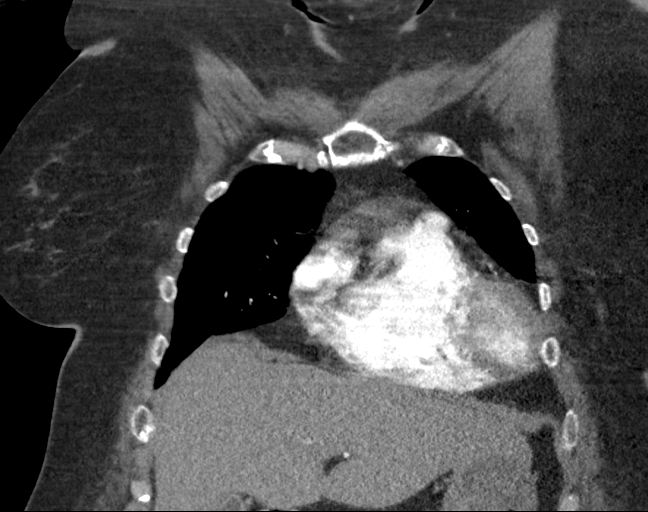
[im 46/92  soft-tissue]
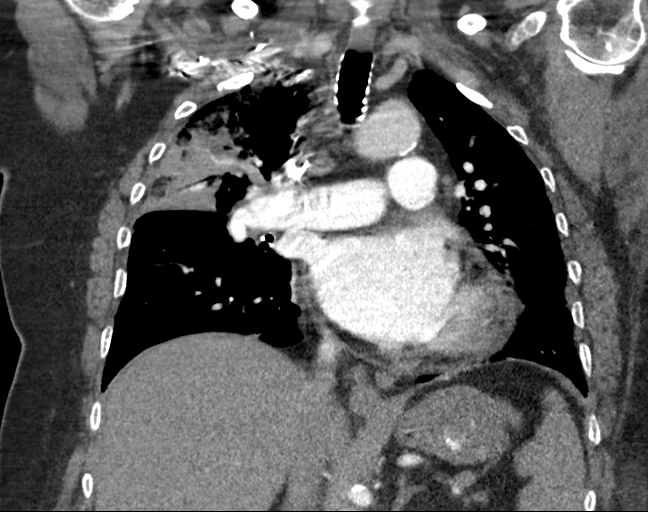
[im 69/92  soft-tissue]
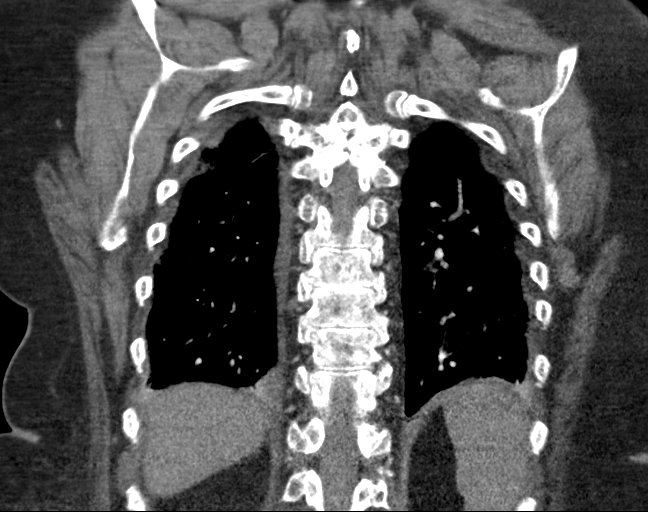

[17 of 46 positions shown; findings below may reference images not displayed]

FINDINGS: Comment: Today's study is limited by considerable patient
respiratory motion.

Cardiovascular: No suspicious central filling defect within the
central, lobar or segmental sized pulmonary arterial tree to suggest
acute pulmonary embolism. Distal subsegmental sized branches are
incompletely evaluated on portions of the examination secondary to
respiratory motion. In the right lower lobe pulmonary artery there
are several webs, likely from remote prior pulmonary infarct. Heart
size is mildly enlarged. There is no significant pericardial fluid,
thickening or pericardial calcification. There is aortic
atherosclerosis, as well as atherosclerosis of the great vessels of
the mediastinum and the coronary arteries, including calcified
atherosclerotic plaque in the left main, left anterior descending
and left circumflex coronary arteries. Dilatation of the pulmonic
trunk (3.9 cm in diameter), suggestive of pulmonary arterial
hypertension.

Mediastinum/Nodes: No pathologically enlarged mediastinal or hilar
lymph nodes. Esophagus is unremarkable in appearance. No axillary
lymphadenopathy.

Lungs/Pleura: Extensive airspace consolidation in the right upper
lobe, compatible with pneumonia. Small right parapneumonic pleural
effusion lying dependently. A few scattered tiny 2-4 mm pulmonary
nodules in the lungs bilaterally, nonspecific, but statistically
likely benign.

Upper Abdomen: Aortic atherosclerosis.

Musculoskeletal: There are no aggressive appearing lytic or blastic
lesions noted in the visualized portions of the skeleton.

Review of the MIP images confirms the above findings.
IMPRESSION: 1. No evidence of acute pulmonary embolism. There are vascular webs
in the distal aspect of the right lower lobe pulmonary artery,
likely sequela of remote thromboembolic disease.
2. Right upper lobe pneumonia with small parapneumonic pleural
effusion.
3. Mild cardiomegaly.
4. Aortic atherosclerosis, in addition to left main and 2 vessel
coronary artery disease. Assessment for potential risk factor
modification, dietary therapy or pharmacologic therapy may be
warranted, if clinically indicated.
5. Dilatation of the pulmonic trunk (3.9 cm in diameter), concerning
for pulmonary arterial hypertension.

Aortic Atherosclerosis ([0U]-[0U]).

## 2018-10-08 MED ORDER — SODIUM CHLORIDE 0.9 % IV SOLN
510.0000 mg | Freq: Once | INTRAVENOUS | Status: AC
  Administered 2018-10-08: 510 mg via INTRAVENOUS
  Filled 2018-10-08 (×2): qty 17

## 2018-10-08 MED ORDER — METHYLPREDNISOLONE SODIUM SUCC 125 MG IJ SOLR
60.0000 mg | Freq: Once | INTRAMUSCULAR | Status: AC
  Administered 2018-10-08: 60 mg via INTRAVENOUS
  Filled 2018-10-08: qty 2

## 2018-10-08 MED ORDER — ENOXAPARIN SODIUM 40 MG/0.4ML ~~LOC~~ SOLN
40.0000 mg | Freq: Two times a day (BID) | SUBCUTANEOUS | Status: DC
  Administered 2018-10-08 – 2018-10-09 (×2): 40 mg via SUBCUTANEOUS
  Filled 2018-10-08 (×2): qty 0.4

## 2018-10-08 MED ORDER — METOPROLOL SUCCINATE ER 100 MG PO TB24
100.0000 mg | ORAL_TABLET | Freq: Every day | ORAL | Status: DC
  Administered 2018-10-09: 100 mg via ORAL
  Filled 2018-10-08: qty 1

## 2018-10-08 MED ORDER — IOHEXOL 350 MG/ML SOLN
75.0000 mL | Freq: Once | INTRAVENOUS | Status: AC | PRN
  Administered 2018-10-08: 75 mL via INTRAVENOUS

## 2018-10-08 MED ORDER — POTASSIUM CHLORIDE CRYS ER 20 MEQ PO TBCR
40.0000 meq | EXTENDED_RELEASE_TABLET | ORAL | Status: AC
  Administered 2018-10-08 (×2): 40 meq via ORAL
  Filled 2018-10-08 (×2): qty 2

## 2018-10-08 MED ORDER — ENOXAPARIN SODIUM 40 MG/0.4ML ~~LOC~~ SOLN: 40.0000 mg | Freq: Two times a day (BID) | SUBCUTANEOUS | Status: DC

## 2018-10-08 MED ORDER — METOPROLOL TARTRATE 50 MG PO TABS
50.0000 mg | ORAL_TABLET | Freq: Once | ORAL | Status: AC
  Administered 2018-10-08: 50 mg via ORAL
  Filled 2018-10-08: qty 1

## 2018-10-08 MED ORDER — BLISTEX MEDICATED EX OINT
1.0000 "application " | TOPICAL_OINTMENT | CUTANEOUS | Status: DC | PRN
  Administered 2018-10-08: 1 via TOPICAL
  Filled 2018-10-08: qty 6.3

## 2018-10-08 MED ORDER — AZITHROMYCIN 250 MG PO TABS
500.0000 mg | ORAL_TABLET | Freq: Every day | ORAL | Status: DC
  Administered 2018-10-09: 500 mg via ORAL
  Filled 2018-10-08: qty 2

## 2018-10-08 MED ORDER — METOPROLOL TARTRATE 5 MG/5ML IV SOLN
5.0000 mg | Freq: Four times a day (QID) | INTRAVENOUS | Status: DC | PRN
  Administered 2018-10-08: 5 mg via INTRAVENOUS
  Filled 2018-10-08: qty 5

## 2018-10-08 NOTE — Progress Notes (Signed)
[place] at [place] NAME: Laura [name]    MR#:  [id number]  DATE OF BIRTH:  July 18, 1945  SUBJECTIVE:   Patient states she is feeling a little bit better today.  She still does not feel well enough to go home, because she is a lot more short of breath than she normally is.  She denies any fevers, chills, cough.  She has noticed some wheezing that started overnight.  She states she has never smoked, but has spent a lot of time around family members who smoke.  REVIEW OF SYSTEMS:  Review of Systems  Constitutional: Negative for chills and fever.  HENT: Negative for congestion and sore throat.   Eyes: Negative for blurred vision and double vision.  Respiratory: Positive for cough and shortness of breath.   Cardiovascular: Negative for chest pain and palpitations.  Gastrointestinal: Negative for nausea and vomiting.  Genitourinary: Negative for dysuria and urgency.  Musculoskeletal: Negative for back pain and neck pain.  Neurological: Negative for dizziness and headaches.  Psychiatric/Behavioral: Negative for depression. The patient is not nervous/anxious.     DRUG ALLERGIES:   Allergies  Allergen Reactions  . Bupropion   . Penicillins Rash   VITALS:  Blood pressure (!) 156/130, pulse (!) 135, temperature 98.6 F (37 C), temperature source Oral, resp. rate 16, height 5\' 4"  (1.626 m), weight (!) 139.2 kg, SpO2 95 %. PHYSICAL EXAMINATION:  Physical Exam  GENERAL:  Laying in the bed with no acute distress.  HEENT: Head atraumatic, normocephalic. Pupils equal, round, reactive to light and accommodation. No scleral icterus. Extraocular muscles intact. Oropharynx and nasopharynx clear.  NECK:  Supple, no jugular venous distention. No thyroid enlargement. LUNGS: + Scattered rhonchi throughout the right lung, +expiratory wheezing. Nasal cannula in place.  Able to speak in full sentences. No use of accessory muscles of respiration.   CARDIOVASCULAR: Tachycardic, regular rhythm, S1, S2 normal. No murmurs, rubs, or gallops.  ABDOMEN: Soft, nontender, nondistended. Bowel sounds present.  EXTREMITIES: No pedal edema, cyanosis, or clubbing.  NEUROLOGIC: CN 2-12 intact, no focal deficits. +global weakness. Sensation intact throughout. Gait not checked.  PSYCHIATRIC: The patient is alert and oriented x 3.  SKIN: No obvious rash, lesion, or ulcer.  LABORATORY PANEL:  Female CBC Recent Labs  Lab 10/08/18 0544  WBC 8.3  HGB 10.6*  HCT 33.8*  PLT 115*   ------------------------------------------------------------------------------------------------------------------ Chemistries  Recent Labs  Lab 10/06/18 1444  10/08/18 0544  NA 132*   < > 141  K 3.0*   < > 2.7*  CL 87*   < > 98  CO2 29   < > 32  GLUCOSE 175*   < > 136*  BUN 28*   < > 21  CREATININE 1.46*   < > 1.07*  CALCIUM 9.1   < > 8.7*  MG  --    < > 2.3  AST 39  --   --   ALT 24  --   --   ALKPHOS 70  --   --   BILITOT 1.0  --   --    < > = values in this interval not displayed.   RADIOLOGY:  No results found. ASSESSMENT AND PLAN:   Acute hypoxic respiratory failure secondary to community-acquired pneumonia- meeting sepsis criteria on admission with fever, tachypnea, tachycardia, leukocytosis, and lactic acidosis.  Sepsis is improving. Remains on 2L O2 this morning. -Continue ceftriaxone and azithromycin -Will give a one-time dose of IV  steroids, as patient is wheezy this morning -Blood cultures with no growth to date -Wean O2 as able -PT consult- recommend SNF, but patient would prefer to go home with home health -Will plan to ambulate with pulse ox prior to discharge -Needs repeat CXR in 3-4 weeks to ensure resolution  Hypokalemia- K remains low today -Replete -Recheck potassium this afternoon  Sinus tachycardia- HR up to the 130s today. May be related to her pneumonia. TSH normal this admission. -Check EKG now -Increase metoprolol to 100mg   daily -Will order CTA chest to rule out PE -Lopressor IV prn -Cardiac monitoring  Elevated troponin- likely demand ischemia in the setting of sepsis.  Troponin trended down.  No active chest pain. -Continue to monitor  Hypertension- BP overall well-controlled -Continue metoprolol, norvasc, and chlorthalidone -Holding home Lasix and spironolactone for now, may restart in the morning  CKD III- creatinine at baseline -Avoid nephrotoxic agents -Monitor  History of rheumatoid arthritis -Holding home methotrexate in the setting of active infection  Hypothyroidism- TSH normal this admission -Continue home Synthroid  Iron deficiency anemia- hemoglobin close to baseline. No active bleeding. -Anemia panel with low iron and low saturation ratios -Will give a dose of IV iron today  All the records are reviewed and case discussed with Care Management/Social Worker. Management plans discussed with the patient, family and they are in agreement.  CODE STATUS: Full Code  TOTAL TIME TAKING CARE OF THIS PATIENT: 35 minutes.   More than 50% of the time was spent in counseling/coordination of care: YES  POSSIBLE D/C IN 1-2 DAYS, DEPENDING ON CLINICAL CONDITION.   Mayo M.D on 10/08/2018 at 1:51 PM  Between 7am to 6pm - Pager - [phone]  After 6pm go to [email or web address] - password [email or web address] [facility]  [facility] [place] [facility]  Office  [phone]  CC: Primary care physician; [phone], MD  Note: This dictation was prepared with Dragon dictation along with smaller phrase technology. Any transcriptional errors that result from this process are unintentional.

## 2018-10-08 NOTE — Progress Notes (Signed)
PHARMACIST - PHYSICIAN COMMUNICATION  CONCERNING:  Enoxaparin (Lovenox) for DVT Prophylaxis    RECOMMENDATION: Patient was prescribed enoxaparin 40mg  q24 hours for VTE prophylaxis.   Filed Weights   10/06/18 1439 10/06/18 2009  Weight: 272 lb (123.4 kg) (!) 306 lb 14.1 oz (139.2 kg)    Body mass index is 52.68 kg/m.  Estimated Creatinine Clearance: 64.4 mL/min (A) (by C-G formula based on SCr of 1.07 mg/dL (H)).   Based on [place] patient is candidate for enoxaparin 40mg  every 12 hour dosing due to BMI being >40.   DESCRIPTION: Pharmacy has adjusted enoxaparin dose per [facility] policy.  Patient is now receiving enoxaparin 40mg  every 12 hours.    Tawnya Crook, PharmD Clinical Pharmacist  10/08/2018 9:46 AM

## 2018-10-08 NOTE — Care Management Note (Signed)
Patient suffers from generalized weakness which impairs their ability to perform daily activities like bathing, dressing and toileting in the home.  A cane, crutch or walker will not resolve issue with performing activities of daily living. A wheelchair will allow patient to safely perform daily activities. Patient can safely propel the wheelchair in the home or has a caregiver who can provide assistance. Length of need Lifetime. Accessories: elevating leg rests (ELRs), wheel locks, extensions and anti-tippers.

## 2018-10-08 NOTE — Progress Notes (Signed)
OT Cancellation Note  Patient Details Name: Laura [name] MRN: [id number] DOB: 1945/11/07   Cancelled Treatment:    Reason Eval/Treat Not Completed: Other (comment). Consult received, chart reviewed. Pt noted with K+ of 2.7. Pt received medication to address K+ around 9:30am. Will hold OT evaluation this morning and re-attempt at later time today to allow time for medication to absorb.   Jeni Salles, MPH, MS, OTR/L ascom [phone] 10/08/18, 10:08 AM

## 2018-10-08 NOTE — Progress Notes (Signed)
PHARMACIST - PHYSICIAN COMMUNICATION  DR:   Brett Albino CONCERNING: Antibiotic IV to Oral Route Change Policy  RECOMMENDATION: This patient is receiving azithromycin by the intravenous route.  Based on criteria approved by the Pharmacy and Therapeutics Committee, the antibiotic(s) is/are being converted to the equivalent oral dose form(s).   DESCRIPTION: These criteria include:  Patient being treated for a respiratory tract infection, urinary tract infection, cellulitis or clostridium difficile associated diarrhea if on metronidazole  The patient is not neutropenic and does not exhibit a GI malabsorption state  The patient is eating (either orally or via tube) and/or has been taking other orally administered medications for a least 24 hours  The patient is improving clinically and has a Tmax < 100.5  If you have questions about this conversion, please contact the Pharmacy Department   Dorena Bodo, PharmD

## 2018-10-08 NOTE — Progress Notes (Signed)
Dr Brett Albino notified critical Potassium 2.7, acknowledged, stated would put orders.

## 2018-10-08 NOTE — Evaluation (Addendum)
Occupational Therapy Evaluation Patient Details Name: Laura [name] MRN: [id number] DOB: [birth year] Today's Date: 10/08/2018    History of Present Illness Patient is a 73 y/o female that presents with shortness of breath, found to have community acquired pneumonia. Her troponins have slowly been decreasing, no ACS suspected per MD -- favoring demand ischemia.   Clinical Impression   Pt seen for OT evaluation this date. Pt noted with K+ of 2.7 this am, noted to have been given medication to address. Activity and response monitored during session. Prior to hospital admission, pt was ambulating with a rollator and reports being independent with ADL and IADL except her husband drives. Pt denies falls. Pt lives with her spouse in a 1 story home with 3 steps to enter. Pt endorsed increased difficulty recently with mobility and ADL tasks 2/2 SOB. Currently pt demonstrates impairments in activity tolerance, BLE strength, pain (chronic, back, hips, knees), and balance, requiring Min assist for LB ADL, CGA to Min A for functional ADL transfers, and Max A for pericare during toileting. Pt instructed in functional ADL transfer training, AE/DME for ADL to minimize bending over and exacerbating her chronic back pain, falls prevention strategies, and pursed lip breathing to support breath recovery. Pt verbalized understanding and would benefit from skilled OT to address noted impairments and functional limitations (see below for any additional details) in order to maximize safety and independence while minimizing falls risk and caregiver burden.  Upon hospital discharge, recommend pt discharge to SNF to support eventual safe return home.    Follow Up Recommendations  SNF    Equipment Recommendations  3 in 1 bedside commode    Recommendations for Other Services       Precautions / Restrictions Precautions Precautions: Fall Restrictions Weight Bearing Restrictions: No      Mobility Bed  Mobility Overal bed mobility: Needs Assistance Bed Mobility: Supine to Sit;Sit to Supine     Supine to sit: Supervision Sit to supine: Supervision   General bed mobility comments: additional time/effort  Transfers Overall transfer level: Needs assistance Equipment used: 4-wheeled walker Transfers: Sit to/from Stand Sit to Stand: Min guard;From elevated surface         General transfer comment: cues for scooting forward prior to lift off, hand placement    Balance Overall balance assessment: Needs assistance Sitting-balance support: No upper extremity supported;Bilateral upper extremity supported Sitting balance-Leahy Scale: Good     Standing balance support: Bilateral upper extremity supported Standing balance-Leahy Scale: Fair Standing balance comment: tends to use RUE forearm to lean on rollator                           ADL either performed or assessed with clinical judgement   ADL                                         General ADL Comments: Pt requires CGA for functional ADL transfers, PRN Min A for LB ADL tasks, Max A for pericare after toileting     Vision Baseline Vision/History: Wears glasses Wears Glasses: At all times Patient Visual Report: No change from baseline Vision Assessment?: No apparent visual deficits     Perception     Praxis      Pertinent Vitals/Pain Pain Assessment: 0-10 Pain Score: 6  Pain Location: Knees/hips Pain Descriptors / Indicators: Aching Pain Intervention(s): Limited activity  within patient's tolerance;Monitored during session;Repositioned;Patient requesting pain meds-RN notified;RN gave pain meds during session;Utilized relaxation techniques     Hand Dominance Right   Extremity/Trunk Assessment Upper Extremity Assessment Upper Extremity Assessment: Overall WFL for tasks assessed   Lower Extremity Assessment Lower Extremity Assessment: Generalized weakness       Communication  Communication Communication: No difficulties   Cognition Arousal/Alertness: Awake/alert Behavior During Therapy: WFL for tasks assessed/performed Overall Cognitive Status: Within Functional Limits for tasks assessed                                     General Comments       Exercises Other Exercises Other Exercises: Pt instructed in functional transfer training as part of toileting to [facility] with instruction in hand/foot placement, scooting forward prior to attempts, and need to reach back for surface prior to sitting to maximize safety as pt tends to promptly sit prior to ensuring stability of sitting surface Other Exercises: Pt instructed in pursed lip breathing and use of energy conservation strategies and AE/DME to improve her independence with ADL while minimizing chronic pain which could increase her risk of falls/injury   Shoulder Instructions      Home Living Family/patient expects to be discharged to:: Private residence Living Arrangements: Spouse/significant other Available Help at Discharge: Family;Available 24 hours/day Type of Home: House Home Access: Stairs to enter [employer] of Steps: 3   Home Layout: One level     Bathroom Shower/Tub: Producer, television/film/video: Handicapped height(with rails)     Home Equipment: Environmental consultant - 4 wheels;Other (comment);Shower seat;Grab bars - tub/shower(toilet rails)          Prior Functioning/Environment Level of Independence: Independent with assistive device(s)        Comments: Patient has been ambulating in the home with 4WW with no recent falls. Reports indep with ADL although sometimes difficult 2/2 back pain or swollen feet, does not drive but spouse drives        OT Problem List: Decreased strength;Pain;Decreased activity tolerance;Impaired balance (sitting and/or standing);Decreased knowledge of use of DME or AE;Obesity      OT Treatment/Interventions: Self-care/ADL  training;Therapeutic activities;Energy conservation;DME and/or AE instruction;Patient/family education;Balance training    OT Goals(Current goals can be found in the care plan section) Acute Rehab OT Goals Patient Stated Goal: To return home safely OT Goal Formulation: With patient Time For Goal Achievement: 10/22/18 Potential to Achieve Goals: Good ADL Goals Pt Will Perform Lower Body Dressing: with modified independence;with adaptive equipment;sit to/from stand Pt Will Transfer to Toilet: with supervision;ambulating(BSC over toilet, LRAD for amb) Additional ADL Goal #1: Pt will verbalize plan to implement at least 1 learned energy conservation strategy to maximize safety/independence in the home.  OT Frequency: Min 1X/week   Barriers to D/C:            Co-evaluation              AM-PAC OT "6 Clicks" Daily Activity     Outcome Measure Help from another person eating meals?: None Help from another person taking care of personal grooming?: A Little Help from another person toileting, which includes using toliet, bedpan, or urinal?: A Lot Help from another person bathing (including washing, rinsing, drying)?: A Little Help from another person to put on and taking off regular upper body clothing?: None Help from another person to put on and taking off regular lower body  clothing?: A Little 6 Click Score: 19   End of Session Equipment Utilized During Treatment: Gait belt;Rolling walker  Activity Tolerance: Patient tolerated treatment well Patient left: in bed;with call bell/phone within reach;with bed alarm set  OT Visit Diagnosis: Other abnormalities of gait and mobility (R26.89);Muscle weakness (generalized) (M62.81);Pain Pain - Right/Left: Right(both hips, back, knees) Pain - part of body: Hip;Knee                Time: 8676-7209 OT Time Calculation (min): 30 min Charges:  OT General Charges $OT Visit: 1 Visit OT Evaluation $OT Eval Low Complexity: 1 Low OT  Treatments $Self Care/Home Management : 8-22 mins $Therapeutic Activity: 8-22 mins  Jeni Salles, MPH, MS, OTR/L ascom [phone] 10/08/18, 12:03 PM

## 2018-10-08 NOTE — Progress Notes (Addendum)
Called by RN that patient is tachycardic to the 130s- EKG was obtained and showed that patient was in a-fib with RVR. This is new for her. I have ordered PRN lopressor. I have increased her home metoprolol dose to 100mg  daily. Cardiac monitoring has been ordered. I have also ordered an ECHO. Will transfer patient to the telemetry floor and ask cardiology to see her.  Hyman Bible, MD

## 2018-10-08 NOTE — Progress Notes (Signed)
PT Cancellation Note  Patient Details Name: Laura [name] MRN: [id number] DOB: 04/16/1945   Cancelled Treatment:    Reason Eval/Treat Not Completed: Other (comment). Pt with current K+ at 2.7 at this time. Not appropriate for PT intervention. Will hold and re-attempt when medically cleared.   Jhase Creppel 10/08/2018, 10:14 AM Greggory Stallion, PT, DPT [phone]

## 2018-10-08 NOTE — TOC Initial Note (Signed)
Transition of Care [facility]) - Initial/Assessment Note    Patient Details  Name: Laura [name] MRN: [id number] Date of Birth: 1945-04-17  Transition of Care [facility]) CM/SW Contact:    Chapman Fitch, RN Phone Number: 10/08/2018, 4:59 PM  Clinical Narrative:                 Patient admitted with PNA Patient lives at home with husband. He is at bedside  PCP Laura [name]   Husband provides transportation Denies issues obtaining medications.   PT has assessed patient and declines.  She is agreeable to home health services.  CMS Medicare.gov Compare Post Acute Care list reviewed with patient.  States she does not have a preference of home health agency.  Patient states she has a rollator, 2 RW, and CPAP at home  Patient currently requiring acute O2.  Patient agreeable to [facility] at discharge  Referral made to [facility] with [facility]   Expected Discharge Plan: Home w Home Health Services     Patient Goals and CMS Choice   CMS Medicare.gov Compare Post Acute Care list provided to:: Patient Choice offered to / list presented to : Patient  Expected Discharge Plan and Services Expected Discharge Plan: Home w Home Health Services       Living arrangements for the past 2 months: Single Family Home                           HH Arranged: RN, PT, Nurse's Aide HH Agency: [facility] Date HH Agency Contacted: 10/08/18 Time HH Agency Contacted: [id number] Representative spoke with at [facility] Agency: Barbara Cower  Prior Living Arrangements/Services Living arrangements for the past 2 months: Single Family Home Lives with:: Spouse Patient language and need for interpreter reviewed:: Yes Do you feel safe going back to the place where you live?: Yes      Need for Family Participation in Patient Care: Yes (Comment) Care giver support system in place?: Yes (comment) Current home services: DME Criminal Activity/Legal Involvement Pertinent to Current Situation/Hospitalization: No  - Comment as needed  Activities of Daily Living Home Assistive Devices/Equipment: Walker (specify type), CPAP ADL Screening (condition at time of admission) Patient's cognitive ability adequate to safely complete daily activities?: Yes Is the patient deaf or have difficulty hearing?: No Does the patient have difficulty seeing, even when wearing glasses/contacts?: No Does the patient have difficulty concentrating, remembering, or making decisions?: No Patient able to express need for assistance with ADLs?: Yes Does the patient have difficulty dressing or bathing?: Yes Independently performs ADLs?: Yes (appropriate for developmental age) Does the patient have difficulty walking or climbing stairs?: Yes Weakness of Legs: Both Weakness of Arms/Hands: None  Permission Sought/Granted Permission sought to share information with : Case Manager                Emotional Assessment Appearance:: Appears stated age Attitude/Demeanor/Rapport: Gracious Affect (typically observed): Accepting Orientation: : Oriented to Self, Oriented to Place, Oriented to  Time, Oriented to Situation   Psych Involvement: No (comment)  Admission diagnosis:  Shortness of breath [R06.02] Vomiting in adult [R11.10] Sepsis due to pneumonia (HCC) [J18.9, A41.9] Community acquired pneumonia of right lung, unspecified part of lung [J18.9] Patient Active Problem List   Diagnosis Date Noted  . Pneumonia 10/06/2018  . Immunosuppression (HCC) 09/18/2018  . Pharmacologic therapy 07/11/2018  . Disorder of skeletal system 07/11/2018  . Problems influencing health status 07/11/2018  . MDD (major depressive disorder),  recurrent episode, mild ([place]) 07/09/2018  . Primary insomnia 07/09/2018  . Bereavement 07/09/2018  . Body mass index (BMI) of 40.0-44.9 in adult ([place]) 10/09/2017  . Postmenopausal 04/06/2017  . Chronic arthralgias of knees and hips (Right) 08/18/2016  . Arthritis 07/11/2016  . Dyspnea on exertion  07/11/2016  . Kidney disease 07/11/2016  . 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 Snoring 07/11/2016  . Morbid obesity with BMI of 40.0-44.9, adult ([place]) 07/11/2016  . Opioid-induced constipation ([facility]) 04/21/2016  . Osteoarthritis of knee (Right) 01/25/2016  . Diet-controlled diabetes mellitus ([place]) 10/03/2015  . GAD (generalized anxiety disorder) 09/24/2015  . Disturbance of skin sensation 07/15/2015  . Elevated sedimentation rate 07/15/2015  . Elevated C-reactive protein (CRP) 07/15/2015  . Chronic hip pain (Left) 04/06/2015  . History of TKR (total knee replacement) (Left) 02/09/2015  . History of femur fracture (Right) 02/09/2015  . 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 Lumbar foraminal stenosis (Bilateral L3-4 and L5-S1) 02/09/2015  . Chronic low back pain (Primary Area of Pain) (Bilateral) (L>R) 01/22/2015  . Chronic pain syndrome 11/25/2014  . Opioid dependence ([place]) 11/07/2014  . Lumbar spinal stenosis (5 mm Severe L3-4; 8 mm L4-5) 11/07/2014  . Lumbar spondylosis 11/07/2014  . Lumbar facet syndrome (Bilateral) (L>R) 11/07/2014  . Chronic knee pain (Right) 11/05/2014  . Opiate use (30 MME/Day) 11/05/2014  . Long term current use of opiate analgesic 11/05/2014  . Long term prescription opiate use 11/05/2014  . Encounter for therapeutic drug level monitoring 11/05/2014  . Morbid obesity ([place]) 10/27/2014  . Extreme obesity ([place]) 10/07/2014  . Type 2 diabetes mellitus ([place]) 03/19/2014  . Depressive disorder 10/14/2013  . Essential (primary) hypertension 10/14/2013  . Insomnia secondary to chronic pain 07/09/2013  . Anxiety 07/09/2013  . GERD (gastroesophageal reflux disease) 01/18/2013  . Hypothyroidism 01/18/2013  . Hypercholesterolemia 01/18/2013  . Seronegative rheumatoid arthritis ([place]) 01/18/2013  . CKD (chronic kidney disease) stage 3, GFR 30-59 ml/min (HCC) 01/17/2013  . Congestive heart  failure with left ventricular systolic dysfunction ([place]) 11/10/2012   PCP:  Adin Hector, MD Pharmacy:   [id number] [address], [place] [address] Phone: [phone] Fax: [phone]     Social Determinants of Health (SDOH) Interventions    Readmission Risk Interventions Readmission Risk Prevention Plan 10/08/2018  Transportation Screening Complete  HRI or Home Care Consult Complete  Medication Review (RN Care Manager) Complete  Some recent data might be hidden

## 2018-10-08 NOTE — Progress Notes (Signed)
Received report from [place] from [facility]. Patient transferred to rm [id number] and resting comfortably. Husband at bedside.

## 2018-10-08 NOTE — Consult Note (Signed)
Cardiology Consultation Note    Patient ID: Laura [name], MRN: [id number], DOB/AGE: 02/12/1945 73 y.o. Admit date: 10/06/2018   Date of Consult: 10/08/2018 Primary Physician: Lynnea Ferrier, MD Primary Cardiologist: [facility]  Chief Complaint: sob Reason for Consultation: afib Requesting MD: Dr. Nancy Marus  HPI: Laura [name] is a 73 y.o. female with history of sleep apnea, chronic kidney disease, reactive airway disease and anxiety who was admitted with shortness of breath.  CT of the chest showed evidence of consolidation of the right upper lobe consistent with pneumonia.  There was no pulmonary embolus.  She had plaque in her coronary arteries.  She was noted to be hypokalemic with potassium of 2.7.  She developed atrial fibrillation with rapid ventricular response which converted back to sinus rhythm after improvement in her serum potassium.  She is currently in sinus rhythm.  She has no known prior history of atrial fibrillation.  Past Medical History:  Diagnosis Date  . Allergy   . Anxiety   . Arthritis, degenerative 10/08/2013   Overview:    a.  Lumbar spine/spinal stenosis/foot drop.   b.  Hands.   . Chronic kidney disease   . Depression   . 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 Major depression, single episode, in complete remission (HCC) 06/25/2015  . Memory loss, short term 03/19/2014  . Seizure (HCC) 10/07/2014  . Sleep apnea   . Thyroid disease       Surgical History:  Past Surgical History:  Procedure Laterality Date  . ABDOMINAL HYSTERECTOMY    . CESAREAN SECTION    . HIP SURGERY Right   . REPLACEMENT TOTAL KNEE Left      Home Meds: Prior to Admission medications   Medication Sig Start Date End Date Taking? Authorizing Provider  amLODipine (NORVASC) 10 MG tablet TAKE 0.5 TABLETS BY MOUTH ONCE NIGHTLY 06/14/18  Yes [provider]  aspirin EC 81 MG tablet Take by mouth.   Yes [provider]  chlorthalidone  (HYGROTON) 25 MG tablet TAKE 1/2 TABLET (12.5MG ) BY MOUTH EVERY DAY 04/06/16  Yes [provider]  Cholecalciferol (VITAMIN D) 2000 UNITS tablet Take by mouth daily.    Yes [provider]  DULoxetine (CYMBALTA) 60 MG capsule TAKE 1 CAPSULE (60 MG TOTAL) BY MOUTH 2 (TWO) TIMES DAILY. 10/01/18  Yes Jomarie Longs, MD  folic acid (FOLVITE) 1 MG tablet Take 1 mg by mouth daily.   Yes [provider]  hydroxychloroquine (PLAQUENIL) 200 MG tablet Take 100 mg by mouth daily.    Yes [provider]  inFLIXimab (REMICADE) 100 MG injection Inject 100 mg into the vein every 8 (eight) weeks.   Yes [provider]  levothyroxine (SYNTHROID) 50 MCG tablet Take 50 mcg by mouth at bedtime.  07/09/18  Yes [provider]  levothyroxine (SYNTHROID, LEVOTHROID) 200 MCG tablet TAKE 1 TABLET ONCE DAILY- ON AN EMPTY STOMACH WITH A GLASS OF WATER 30-60 MINUTES BEFORE BREAKFAST 09/09/14  Yes [provider]  loratadine (CLARITIN) 10 MG tablet Take 10 mg by mouth daily.  04/05/14  Yes [provider]  methotrexate (RHEUMATREX) 2.5 MG tablet TAKE 4 TABLETS (10 MG TOTAL) BY MOUTH EVERY 7 (SEVEN) DAYS WITH A MEAL 03/15/16  Yes [provider]  metoprolol succinate (TOPROL-XL) 50 MG 24 hr tablet TAKE 1 TABLET (50 MG TOTAL) BY MOUTH ONCE DAILY. 07/29/14  Yes [provider]  Multiple Vitamin (MULTI-VITAMINS) TABS Take by mouth.   Yes [provider]  QUEtiapine (SEROQUEL) 300 MG tablet TAKE 1 TABLET BY MOUTH EVERYDAY AT BEDTIME 08/17/18  Yes Eappen, Saramma, MD  rosuvastatin (CRESTOR) 5 MG tablet Take 5 mg by mouth 2 (two) times a week.   Yes [provider]  spironolactone (ALDACTONE) 25 MG tablet TAKE 1/2 (ONE-HALF) TABLET BY MOUTH DAILY 09/12/14  Yes [provider]    Inpatient Medications:  . amLODipine  2.5 mg Oral Daily  . aspirin EC  81 mg Oral Daily  . [START ON 10/09/2018] azithromycin  500 mg Oral Daily  .  chlorthalidone  12.5 mg Oral Daily  . cholecalciferol  2,000 Units Oral Daily  . docusate sodium  100 mg Oral BID  . DULoxetine  60 mg Oral BID  . enoxaparin (LOVENOX) injection  40 mg Subcutaneous Q12H  . folic acid  1 mg Oral Daily  . hydroxychloroquine  100 mg Oral Daily  . levothyroxine  200 mcg Oral Q0600  . levothyroxine  50 mcg Oral Q0600  . [START ON 10/09/2018] metoprolol succinate  100 mg Oral Daily  . multivitamin with minerals  1 tablet Oral Daily  . pneumococcal 23 valent vaccine  0.5 mL Intramuscular Tomorrow-1000  . QUEtiapine  300 mg Oral QHS   . sodium chloride Stopped (10/08/18 0939)  . cefTRIAXone (ROCEPHIN)  IV Stopped (10/08/18 1010)    Allergies:  Allergies  Allergen Reactions  . Bupropion   . Penicillins Rash    Social History   Socioeconomic History  . Marital status: Married    Spouse name: Not on file  . Number of children: Not on file  . Years of education: Not on file  . Highest education level: Not on file  Occupational History    Comment: retired  Engineer, production  . Financial resource strain: Not hard at all  . Food insecurity    Worry: Never true    Inability: Never true  . Transportation needs    Medical: No    Non-medical: No  Tobacco Use  . Smoking status: Never Smoker  . Smokeless tobacco: Never Used  Substance and Sexual Activity  . Alcohol use: No    Alcohol/week: 0.0 standard drinks  . Drug use: No  . Sexual activity: Not Currently  Lifestyle  . Physical activity    Days per week: 0 days    Minutes per session: 0 min  . Stress: Not at all  Relationships  . Social connections    Talks on phone: More than three times a week    Gets together: Once a week    Attends religious service: Never    Active member of club or organization: No    Attends meetings of clubs or organizations: Never    Relationship status: Married  . Intimate partner violence    Fear of current or ex partner: No    Emotionally abused: No     Physically abused: No    Forced sexual activity: No  Other Topics Concern  . Not on file  Social History Narrative  . Not on file     Family History  Problem Relation Age of Onset  . Hypertension Mother   . Stroke Mother   . Heart attack Father   . Alcohol abuse Father   . Depression Father   . Post-traumatic stress disorder Father   . Rheum arthritis Sister   . Hypertension Sister   . Depression Sister   . Breast cancer Neg Hx  Review of Systems: A 12-system review of systems was performed and is negative except as noted in the HPI.  Labs: No results for input(s): CKTOTAL, CKMB, TROPONINI in the last 72 hours. Lab Results  Component Value Date   WBC 8.3 10/08/2018   HGB 10.6 (L) 10/08/2018   HCT 33.8 (L) 10/08/2018   MCV 94.2 10/08/2018   PLT 115 (L) 10/08/2018    Recent Labs  Lab 10/06/18 1444  10/08/18 0544 10/08/18 1452  NA 132*   < > 141  --   K 3.0*   < > 2.7* 3.9  CL 87*   < > 98  --   CO2 29   < > 32  --   BUN 28*   < > 21  --   CREATININE 1.46*   < > 1.07*  --   CALCIUM 9.1   < > 8.7*  --   PROT 7.0  --   --   --   BILITOT 1.0  --   --   --   ALKPHOS 70  --   --   --   ALT 24  --   --   --   AST 39  --   --   --   GLUCOSE 175*   < > 136*  --    < > = values in this interval not displayed.   No results found for: CHOL, HDL, LDLCALC, TRIG No results found for: DDIMER  Radiology/Studies:  Dg Chest 2 View  Result Date: 10/06/2018 CLINICAL DATA:  Shortness of breath. EXAM: CHEST - 2 VIEW COMPARISON:  Radiograph of May 23, 2017. FINDINGS: Stable cardiomegaly. New right upper lobe airspace opacity is noted consistent with pneumonia. Left lung is clear. No pneumothorax or pleural effusion is noted. Bony thorax is unremarkable. IMPRESSION: New right upper lobe airspace opacity is noted consistent with pneumonia. Followup PA and lateral chest X-ray is recommended in 3-4 weeks following trial of antibiotic therapy to ensure resolution and exclude  underlying malignancy. Electronically Signed   By: Marijo Conception M.D.   On: 10/06/2018 15:38   Ct Angio Chest Pe W Or Wo Contrast  Result Date: 10/08/2018 CLINICAL DATA:  73 year old female with history of tachycardia and atrial fibrillation. Suspected pulmonary embolism. EXAM: CT ANGIOGRAPHY CHEST WITH CONTRAST TECHNIQUE: Multidetector CT imaging of the chest was performed using the standard protocol during bolus administration of intravenous contrast. Multiplanar CT image reconstructions and MIPs were obtained to evaluate the vascular anatomy. CONTRAST:  90mL OMNIPAQUE IOHEXOL 350 MG/ML SOLN COMPARISON:  No priors. FINDINGS: Comment: Today's study is limited by considerable patient respiratory motion. Cardiovascular: No suspicious central filling defect within the central, lobar or segmental sized pulmonary arterial tree to suggest acute pulmonary embolism. Distal subsegmental sized branches are incompletely evaluated on portions of the examination secondary to respiratory motion. 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 Heart size is mildly enlarged. There is no significant pericardial fluid, thickening or pericardial calcification. There is aortic atherosclerosis, as well as atherosclerosis of the great vessels of the mediastinum and the coronary arteries, including calcified atherosclerotic plaque in the left main, left anterior descending and left circumflex coronary arteries. Dilatation of the pulmonic trunk (3.9 cm in diameter), suggestive of pulmonary arterial hypertension. Mediastinum/Nodes: No pathologically enlarged mediastinal or hilar lymph nodes. Esophagus is unremarkable in appearance. No axillary lymphadenopathy. Lungs/Pleura: Extensive airspace consolidation in the right upper lobe, compatible with pneumonia. Small  right parapneumonic pleural effusion lying dependently. A few scattered tiny 2-4 mm pulmonary nodules in the  lungs bilaterally, nonspecific, but statistically likely benign. Upper Abdomen: Aortic atherosclerosis. Musculoskeletal: There are no aggressive appearing lytic or blastic lesions noted in the visualized portions of the skeleton. Review of the MIP images confirms the above findings. IMPRESSION: 1. No evidence of acute pulmonary embolism. There are vascular webs in the distal aspect of the right lower lobe pulmonary artery, likely sequela of remote thromboembolic disease. 2. Right upper lobe pneumonia with small parapneumonic pleural effusion. 3. Mild cardiomegaly. 4. Aortic atherosclerosis, in addition to left main and 2 vessel coronary artery disease. Assessment for potential risk factor modification, dietary therapy or pharmacologic therapy may be warranted, if clinically indicated. 5. Dilatation of the pulmonic trunk (3.9 cm in diameter), concerning for pulmonary arterial hypertension. Aortic Atherosclerosis (ICD10-I70.0). Electronically Signed   By: Trudie Reedaniel  Entrikin M.D.   On: 10/08/2018 16:41    Wt Readings from Last 3 Encounters:  10/08/18 134.1 kg  04/24/18 118.8 kg  03/14/18 120.2 kg    EKG: ATRIAL fibrillation with rapid ventricular response initially currently sinus rhythm.  Physical Exam:  Blood pressure 102/72, pulse 86, temperature 98 F (36.7 C), temperature source Oral, resp. rate 20, height 5\' 4"  (1.626 m), weight 134.1 kg, SpO2 95 %. Body mass index is 50.74 kg/m. General: Well developed, well nourished, in no acute distress. Head: Normocephalic, atraumatic, sclera non-icteric, no xanthomas, nares are without discharge.  Neck: Negative for carotid bruits. JVD not elevated. Lungs: Clear bilaterally to auscultation without wheezes, rales, or rhonchi. Breathing is unlabored. Heart: RRR with S1 S2. No murmurs, rubs, or gallops appreciated. Abdomen: Soft, non-tender, non-distended with normoactive bowel sounds. No hepatomegaly. No rebound/guarding. No obvious abdominal masses. Msk:   Strength and tone appear normal for age. Extremities: No clubbing or cyanosis. No edema.  Distal pedal pulses are 2+ and equal bilaterally. Neuro: Alert and oriented X 3. No facial asymmetry. No focal deficit. Moves all extremities spontaneously. Psych:  Responds to questions appropriately with a normal affect.     Assessment and Plan  73 year old female with no prior cardiac history admitted with probable right upper lobe pneumonia.  Was noted to be high flow kalemia.  Developed atrial fibrillation with rapid ventricular response.  She is converted back to sinus rhythm after resolution of her hypokalemia.  She is currently stable.  She has no chest pain.  She denies any change in her functional status recently other than recent increasing shortness of breath which prompted admission.  Plan will review echo when available.  Continue with current medical therapy including close follow-up with her serum potassium as this likely was partly the etiology of her A. fib.  She also has pneumonia which playing a role as well.  Would not anticoagulate at present follow-up for further A. fib.  This can be followed as an outpatient to determine if non-situational A. fib recurs.  Continue with metoprolol succinate at 100 mg daily. Signed, Dalia HeadingKenneth A Amorita Vanrossum MD 10/08/2018, 8:28 PM Pager: [phone]

## 2018-10-09 ENCOUNTER — Inpatient Hospital Stay
Admit: 2018-10-09 | Discharge: 2018-10-09 | Disposition: A | Discharge status: Home / Self Care | Payer: Medicare Other | Attending: Internal Medicine | Admitting: Internal Medicine

## 2018-10-09 LAB — BASIC METABOLIC PANEL
Anion gap: 10 (ref 5–15)
BUN: 24 mg/dL — ABNORMAL HIGH (ref 8–23)
CO2: 30 mmol/L (ref 22–32)
Calcium: 8.7 mg/dL — ABNORMAL LOW (ref 8.9–10.3)
Chloride: 98 mmol/L (ref 98–111)
Creatinine, Ser: 1.02 mg/dL — ABNORMAL HIGH (ref 0.44–1.00)
GFR calc Af Amer: >60 mL/min (ref >60)
GFR calc non Af Amer: 54 mL/min — ABNORMAL LOW (ref >60)
Glucose, Bld: 216 mg/dL — ABNORMAL HIGH (ref 70–99)
Potassium: 4 mmol/L (ref 3.5–5.1)
Sodium: 138 mmol/L (ref 135–145)

## 2018-10-09 LAB — CBC
HCT: 34.4 % — ABNORMAL LOW (ref 36.0–46.0)
Hemoglobin: 11.2 g/dL — ABNORMAL LOW (ref 12.0–15.0)
MCH: 29.7 pg (ref 26.0–34.0)
MCHC: 32.6 g/dL (ref 30.0–36.0)
MCV: 91.2 fL (ref 80.0–100.0)
Platelets: 225 10*3/uL (ref 150–400)
RBC: 3.77 MIL/uL — ABNORMAL LOW (ref 3.87–5.11)
RDW: 14.5 % (ref 11.5–15.5)
WBC: 8.1 10*3/uL (ref 4.0–10.5)
nRBC: 0 % (ref 0.0–0.2)

## 2018-10-09 LAB — ECHOCARDIOGRAM COMPLETE
Height: 64 in
Weight: 4783.1 oz

## 2018-10-09 MED ORDER — CEFDINIR 300 MG PO CAPS: 300.0000 mg | ORAL_CAPSULE | Freq: Two times a day (BID) | ORAL | 0 refills | Status: DC

## 2018-10-09 MED ORDER — METOPROLOL SUCCINATE ER 100 MG PO TB24: 100.0000 mg | ORAL_TABLET | Freq: Every day | ORAL | 0 refills | Status: AC

## 2018-10-09 MED ORDER — OXYCODONE HCL 5 MG PO TABS: 5.0000 mg | ORAL_TABLET | Freq: Four times a day (QID) | ORAL | 0 refills | Status: DC | PRN

## 2018-10-09 NOTE — Discharge Instructions (Signed)
It was so nice to meet you during this hospitalization!  You came into the hospital with shortness of breath and we found out that you had a pneumonia. I have prescribed an antibiotic for you called Cefdinir Carole Civil). Please take 1 tablet (300mg ) by mouth once tonight and then continue twice a day for 2 more days.  You went into a funny heart rhythm called A-fib when you were here. We did a cat scan of your chest and it did not show a blood clot. It just showed a pneumonia, which we already knew you had. We think you went into a-fib because the pneumonia put extra stress on your heart and because your potassium was low. Both of these can put you at risk for developing a-fib. You were seen by the heart doctor (Dr. Ubaldo Glassing) who did not recommend a blood thinner at this time. We did increase your metoprolol dose from 50mg  daily to 100mg  daily to help keep your heart rate down.   Take care, Dr. Brett Albino

## 2018-10-09 NOTE — Care Management Important Message (Signed)
Important Message  Patient Details  Name: Laura [name] MRN: [id number] Date of Birth: 11/19/1945   Medicare Important Message Given:  Yes     Dannette Barbara 10/09/2018, 11:14 AM

## 2018-10-09 NOTE — Discharge Summary (Signed)
[facility] [place] at [facility]   PATIENT NAME: Laura [name]    MR#:  [id number]  DATE OF BIRTH:  12-13-1945  DATE OF ADMISSION:  10/06/2018   ADMITTING PHYSICIAN: Jama FlavorsJude Ojie, MD  DATE OF DISCHARGE: 10/09/2018  3:28 PM  PRIMARY CARE PHYSICIAN: Lynnea FerrierKlein, Bert J III, MD   ADMISSION DIAGNOSIS:  Shortness of breath [R06.02] Vomiting in adult [R11.10] Sepsis due to pneumonia (HCC) [J18.9, A41.9] Community acquired pneumonia of right lung, unspecified part of lung [J18.9] DISCHARGE DIAGNOSIS:  Active Problems:   Pneumonia  SECONDARY DIAGNOSIS:   Past Medical History:  Diagnosis Date  . Allergy   . Anxiety   . Arthritis, degenerative 10/08/2013   Overview:    a.  Lumbar spine/spinal stenosis/foot drop.   b.  Hands.   . Chronic kidney disease   . Depression   . 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 Major depression, single episode, in complete remission (HCC) 06/25/2015  . Memory loss, short term 03/19/2014  . Seizure (HCC) 10/07/2014  . Sleep apnea   . Thyroid disease    HOSPITAL COURSE:   Laura [name] is a 73 year old female who presented to the ED with shortness of breath, cough, and fevers.  Chest x-ray showed a right upper lobe pneumonia.  In the ED, she was meeting sepsis criteria.  She was admitted for further management.  Community-acquired pneumonia- meeting sepsis criteria on admission with fever, tachypnea, tachycardia, leukocytosis, and lactic acidosis.  Sepsis resolved. -CTA chest showed a right upper lobe pneumonia, no PE. -Treated with ceftriaxone and azithromycin and discharged home on Omnicef. -Blood cultures with no growth to date -PT consult- recommend SNF, but patient would prefer to go home with home health -Needs repeat CXR in 3-4 weeks to ensure resolution  Paroxysmal atrial fibrillation- patient did have an episode of A. fib in the setting of pneumonia and hypokalemia. -ECHO with EF 60-65% -Converted to  NSR with a dose of Lopressor -Home metoprolol dose was increased from 50 mg daily to 100 mg daily -Seen by cardiology, who did not recommend starting anticoagulation at this time -Patient will follow-up with cardiology as an outpatient.  Elevated troponin- likely demand ischemia in the setting of sepsis.  Troponin trended down.  No active chest pain.  Hypertension- BP overall well-controlled -Home BP meds were continued  CKD III- creatinine at baseline -Monitor as an outpatient  History of rheumatoid arthritis -Home methotrexate was restarted on discharge  Hypothyroidism- TSH normal this admission -Continued home Synthroid  Iron deficiency anemia- hemoglobin close to baseline. No active bleeding. -Anemia panel with low iron and low saturation ratios -Given a dose of IV iron x 1  DISCHARGE CONDITIONS:  Community-acquired pneumonia Paroxysmal atrial fibrillation Elevated troponin Hypertension CKD III History of rheumatoid arthritis Hypothyroidism Iron deficiency anemia CONSULTS OBTAINED:  Treatment Team:  Dalia HeadingFath, Kenneth A, MD DRUG ALLERGIES:   Allergies  Allergen Reactions  . Bupropion   . Penicillins Rash   DISCHARGE MEDICATIONS:   Allergies as of 10/09/2018      Reactions   Bupropion    Penicillins Rash      Medication List    TAKE these medications   amLODipine 10 MG tablet Commonly known as: NORVASC TAKE 0.5 TABLETS BY MOUTH ONCE NIGHTLY Notes to patient: Take tomorrow    aspirin EC 81 MG tablet Take by mouth. Notes to patient: Take tomorrow   cefdinir 300 MG capsule Commonly known as: OMNICEF Take 1 capsule (300 mg total) by  mouth 2 (two) times daily. Notes to patient: Take tomorrow morning and night    chlorthalidone 25 MG tablet Commonly known as: HYGROTON TAKE 1/2 TABLET (12.5MG ) BY MOUTH EVERY DAY Notes to patient: Take tomorrow   DULoxetine 60 MG capsule Commonly known as: CYMBALTA TAKE 1 CAPSULE (60 MG TOTAL) BY MOUTH 2 (TWO)  TIMES DAILY. Notes to patient: Take tonight Take in morning and at night   folic acid 1 MG tablet Commonly known as: FOLVITE Take 1 mg by mouth daily. Notes to patient: Take tomorrow   hydroxychloroquine 200 MG tablet Commonly known as: PLAQUENIL Take 100 mg by mouth daily. Notes to patient: Take tomorrow morning   levothyroxine 200 MCG tablet Commonly known as: SYNTHROID TAKE 1 TABLET ONCE DAILY- ON AN EMPTY STOMACH WITH A GLASS OF WATER 30-60 MINUTES BEFORE BREAKFAST Notes to patient: Take tomorrow   levothyroxine 50 MCG tablet Commonly known as: SYNTHROID Take 50 mcg by mouth at bedtime. Notes to patient: Take at bedtime   loratadine 10 MG tablet Commonly known as: CLARITIN Take 10 mg by mouth daily. Notes to patient: Take tomorrow   methotrexate 2.5 MG tablet Commonly known as: RHEUMATREX TAKE 4 TABLETS (10 MG TOTAL) BY MOUTH EVERY 7 (SEVEN) DAYS WITH A MEAL   metoprolol succinate 100 MG 24 hr tablet Commonly known as: TOPROL-XL Take 1 tablet (100 mg total) by mouth daily. Take with or immediately following a meal. Start taking on: October 10, 2018 What changed:   medication strength  See the new instructions. Notes to patient: Take tomorrow   Multi-Vitamins Tabs Take by mouth. Notes to patient: Take tomorrow   oxyCODONE 5 MG immediate release tablet Commonly known as: Oxy IR/ROXICODONE Take 1 tablet (5 mg total) by mouth every 6 (six) hours as needed for moderate pain or severe pain.   QUEtiapine 300 MG tablet Commonly known as: SEROQUEL TAKE 1 TABLET BY MOUTH EVERYDAY AT BEDTIME Notes to patient: Take tonight at bedtime   Remicade 100 MG injection Generic drug: inFLIXimab Inject 100 mg into the vein every 8 (eight) weeks.   rosuvastatin 5 MG tablet Commonly known as: CRESTOR Take 5 mg by mouth 2 (two) times a week. Notes to patient: Take as you were at home   spironolactone 25 MG tablet Commonly known as: ALDACTONE TAKE 1/2 (ONE-HALF) TABLET  BY MOUTH DAILY Notes to patient: Take tomorrow   Vitamin D 50 MCG (2000 UT) tablet Take by mouth daily. Notes to patient: Take tomorrow            Durable Medical Equipment  (From admission, onward)         Start     Ordered   10/08/18 1406  For home use only DME 3 n 1  Once     10/08/18 1405   10/08/18 1406  For home use only DME standard manual wheelchair with seat cushion  Once    Comments: Patient suffers from generalized weakness which impairs their ability to perform daily activities like bathing, dressing and toileting in the home.  A cane, crutch or walker will not resolve issue with performing activities of daily living. A wheelchair will allow patient to safely perform daily activities. Patient can safely propel the wheelchair in the home or has a caregiver who can provide assistance. Length of need Lifetime. Accessories: elevating leg rests (ELRs), wheel locks, extensions and anti-tippers.   10/08/18 1406           DISCHARGE INSTRUCTIONS:  1.  Follow-up  with PCP in 5 days 2.  Follow-up with cardiology in 2 weeks 3.  Take Omnicef twice daily as prescribed 4.  Increase metoprolol dose from 50 mg daily to 100 mg daily DIET:  Cardiac diet  DISCHARGE CONDITION:  Stable ACTIVITY:  Activity as tolerated OXYGEN:  Home Oxygen: No.  Oxygen Delivery: room air DISCHARGE LOCATION:  home   If you experience worsening of your admission symptoms, develop shortness of breath, life threatening emergency, suicidal or homicidal thoughts you must seek medical attention immediately by calling 911 or calling your MD immediately  if symptoms less severe.  You Must read complete instructions/literature along with all the possible adverse reactions/side effects for all the Medicines you take and that have been prescribed to you. Take any new Medicines after you have completely understood and accpet all the possible adverse reactions/side effects.   Please note  You were cared  for by a hospitalist during your hospital stay. If you have any questions about your discharge medications or the care you received while you were in the hospital after you are discharged, you can call the unit and asked to speak with the hospitalist on call if the hospitalist that took care of you is not available. Once you are discharged, your primary care physician will handle any further medical issues. Please note that NO REFILLS for any discharge medications will be authorized once you are discharged, as it is imperative that you return to your primary care physician (or establish a relationship with a primary care physician if you do not have one) for your aftercare needs so that they can reassess your need for medications and monitor your lab values.    On the day of Discharge:  VITAL SIGNS:  Blood pressure (!) 157/58, pulse 88, temperature (!) 97.4 F (36.3 C), temperature source Oral, resp. rate 19, height 5\' 4"  (1.626 m), weight 135.6 kg, SpO2 97 %. PHYSICAL EXAMINATION:  GENERAL:  73 y.o.-year-old patient lying in the bed with no acute distress.  Well-appearing. EYES: Pupils equal, round, reactive to light and accommodation. No scleral icterus. Extraocular muscles intact.  HEENT: Head atraumatic, normocephalic. Oropharynx and nasopharynx clear.  NECK:  Supple, no jugular venous distention. No thyroid enlargement, no tenderness.  LUNGS: Normal breath sounds bilaterally, no wheezing, rales,rhonchi or crepitation. No use of accessory muscles of respiration.  Able to speak in full sentences. CARDIOVASCULAR: RRR, S1, S2 normal. No murmurs, rubs, or gallops.  ABDOMEN: Soft, non-tender, non-distended. Bowel sounds present. No organomegaly or mass.  EXTREMITIES: No pedal edema, cyanosis, or clubbing.  NEUROLOGIC: Cranial nerves II through XII are intact. + Global weakness. Sensation intact. Gait not checked.  PSYCHIATRIC: The patient is alert and oriented x 3.  SKIN: No obvious rash, lesion,  or ulcer.  DATA REVIEW:   CBC Recent Labs  Lab 10/09/18 0418  WBC 8.1  HGB 11.2*  HCT 34.4*  PLT 225    Chemistries  Recent Labs  Lab 10/06/18 1444  10/08/18 0544  10/09/18 0418  NA 132*   < > 141  --  138  K 3.0*   < > 2.7*   < > 4.0  CL 87*   < > 98  --  98  CO2 29   < > 32  --  30  GLUCOSE 175*   < > 136*  --  216*  BUN 28*   < > 21  --  24*  CREATININE 1.46*   < > 1.07*  --  1.02*  CALCIUM 9.1   < > 8.7*  --  8.7*  MG  --    < > 2.3  --   --   AST 39  --   --   --   --   ALT 24  --   --   --   --   ALKPHOS 70  --   --   --   --   BILITOT 1.0  --   --   --   --    < > = values in this interval not displayed.     Microbiology Results  Results for orders placed or performed during the hospital encounter of 10/06/18  SARS Coronavirus 2 [facility] order, Performed in [facility] hospital lab) Nasopharyngeal Nasopharyngeal Swab     Status: None   Collection Time: 10/06/18  3:38 PM   Specimen: Nasopharyngeal Swab  Result Value Ref Range Status   SARS Coronavirus 2 NEGATIVE NEGATIVE Final    Comment: (NOTE) If result is NEGATIVE SARS-CoV-2 target nucleic acids are NOT DETECTED. The SARS-CoV-2 RNA is generally detectable in upper and lower  respiratory specimens during the acute phase of infection. The lowest  concentration of SARS-CoV-2 viral copies this assay can detect is 250  copies / mL. A negative result does not preclude SARS-CoV-2 infection  and should not be used as the sole basis for treatment or other  patient management decisions.  A negative result may occur with  improper specimen collection / handling, submission of specimen other  than nasopharyngeal swab, presence of viral mutation(s) within the  areas targeted by this assay, and inadequate number of viral copies  (<250 copies / mL). A negative result must be combined with clinical  observations, patient history, and epidemiological information. If result is POSITIVE SARS-CoV-2 target nucleic acids  are DETECTED. The SARS-CoV-2 RNA is generally detectable in upper and lower  respiratory specimens dur ing the acute phase of infection.  Positive  results are indicative of active infection with SARS-CoV-2.  Clinical  correlation with patient history and other diagnostic information is  necessary to determine patient infection status.  Positive results do  not rule out bacterial infection or co-infection with other viruses. If result is PRESUMPTIVE POSTIVE SARS-CoV-2 nucleic acids MAY BE PRESENT.   A presumptive positive result was obtained on the submitted specimen  and confirmed on repeat testing.  While 2019 novel coronavirus  (SARS-CoV-2) nucleic acids may be present in the submitted sample  additional confirmatory testing may be necessary for epidemiological  and / or clinical management purposes  to differentiate between  SARS-CoV-2 and other Sarbecovirus currently known to infect humans.  If clinically indicated additional testing with an alternate test  methodology [phone]) is advised. The SARS-CoV-2 RNA is generally  detectable in upper and lower respiratory sp ecimens during the acute  phase of infection. The expected result is Negative. Fact Sheet for Patients:  BoilerBrush.com.cy Fact Sheet for Healthcare Providers: https://pope.com/ This test is not yet approved or cleared by the [place] FDA and has been authorized for detection and/or diagnosis of SARS-CoV-2 by FDA under an Emergency Use Authorization (EUA).  This EUA will remain in effect (meaning this test can be used) for the duration of the COVID-19 declaration under Section 564(b)(1) of the Act, 21 U.S.C. section 360bbb-3(b)(1), unless the authorization is terminated or revoked sooner. Performed at [facility], [address] [place], [place] [address]   Culture, blood (routine x 2)     Status:  None (Preliminary result)   Collection Time:  10/06/18  4:29 PM   Specimen: BLOOD  Result Value Ref Range Status   Specimen Description BLOOD HAND  Final   Special Requests   Final    BOTTLES DRAWN AEROBIC AND ANAEROBIC Blood Culture results may not be optimal due to an inadequate volume of blood received in culture bottles   Culture   Final    NO GROWTH 3 DAYS Performed at [facility], [address] [place], [place] [address]    Report Status PENDING  Incomplete  Culture, blood (routine x 2)     Status: None (Preliminary result)   Collection Time: 10/06/18  5:30 PM   Specimen: BLOOD  Result Value Ref Range Status   Specimen Description BLOOD RT HAND  Final   Special Requests   Final    BOTTLES DRAWN AEROBIC AND ANAEROBIC Blood Culture results may not be optimal due to an inadequate volume of blood received in culture bottles   Culture   Final    NO GROWTH 3 DAYS Performed at [facility], [address] [place], [place] [address]    Report Status PENDING  Incomplete  MRSA PCR Screening     Status: None   Collection Time: 10/06/18 10:20 PM   Specimen: Nasopharyngeal  Result Value Ref Range Status   MRSA by PCR NEGATIVE NEGATIVE Final    Comment:        The GeneXpert MRSA Assay (FDA approved for NASAL specimens only), is one component of a comprehensive MRSA colonization surveillance program. It is not intended to diagnose MRSA infection nor to guide or monitor treatment for MRSA infections. Performed at [facility], [address] [place], [place] [address]     RADIOLOGY:  No results found.   Management plans discussed with the patient, family and they are in agreement.  CODE STATUS: Full Code   TOTAL TIME TAKING CARE OF THIS PATIENT: 45 minutes.    Berna Spare Mayo M.D on 10/09/2018 at 4:43 PM  Between 7am to 6pm - Pager - [phone]  After 6pm go to [email or web address] - password [email or web address] [facility]  [facility] [place] [facility]  Office  [phone]  CC: Primary care  physician; Adin Hector, MD   Note: This dictation was prepared with Dragon dictation along with smaller phrase technology. Any transcriptional errors that result from this process are unintentional.

## 2018-10-09 NOTE — TOC Transition Note (Signed)
Transition of Care [facility]) - CM/SW Discharge Note   Patient Details  Name: Laura [name] MRN: [id number] Date of Birth: 09-Apr-1945  [facility] of [facility] CM/SW Contact:  Elza Rafter, RN Phone Number: 10/09/2018, 2:15 PM   Clinical Narrative:   Patient discharging home today.  Jason with [facility] is aware to start services.  Spoke with Brad with [facility] and wheelchair will be delivered to home.  No further needs identified at this time by CM team.            Patient Goals and CMS Choice   CMS Medicare.gov Compare Post Acute Care list provided to:: Patient Choice offered to / list presented to : Patient  Discharge Placement                       Discharge Plan and Services                          HH Arranged: RN, PT, Nurse's Aide [facility] Agency: [place] ([facility]) Date [place]: 10/08/18 Time [place]: 1658 Representative spoke with at [place]: [place] ([place]) Interventions     Readmission Risk Interventions Readmission Risk Prevention Plan 10/09/2018 10/08/2018  Transportation Screening - Complete  PCP or Specialist Appt within 3-5 Days Complete -  HRI or [place] - Complete  Social Work Consult for [place] Planning/Counseling Complete -  Palliative Care Screening Not Applicable -  Medication Review Press photographer) - Complete  Some recent data might be hidden

## 2018-10-09 NOTE — Progress Notes (Signed)
OT Cancellation Note  Patient Details Name: Laura [name] MRN: [id number] DOB: 11-09-1945   Cancelled Treatment:    Reason Eval/Treat Not Completed: Patient declined, no reason specified. Upon attempt, pt seated EOB, eager to return home soon. Pt politely declined additional OT treatment at this time. Pt verbalized interest in [place] and RNCM previous notified. Should pt not discharge today, will re-attempt OT tx next date as appropriate.   Jeni Salles, MPH, MS, OTR/L ascom [phone] 10/09/18, 2:11 PM

## 2018-10-09 NOTE — Progress Notes (Signed)
*  PRELIMINARY RESULTS* Echocardiogram 2D Echocardiogram has been performed.  Laura [name] 10/09/2018, 10:10 AM

## 2018-10-09 NOTE — Plan of Care (Signed)
  Problem: Clinical Measurements: Goal: Ability to maintain a body temperature in the normal range will improve Outcome: Progressing Afebrile this shift.   Problem: Respiratory: Goal: Ability to maintain adequate ventilation will improve Outcome: Progressing Up to bedside commode on room air without distress.

## 2018-10-10 ENCOUNTER — Encounter: Payer: PRIVATE HEALTH INSURANCE | Admitting: Pain Medicine

## 2018-10-10 ENCOUNTER — Telehealth: Payer: Self-pay | Admitting: *Deleted

## 2018-10-11 ENCOUNTER — Telehealth: Payer: Self-pay

## 2018-10-11 LAB — CULTURE, BLOOD (ROUTINE X 2)
Culture: NO GROWTH
Culture: NO GROWTH

## 2018-10-11 NOTE — Telephone Encounter (Signed)
Patients husband called to if its ok for them to fill script as the patient is still in a lot of pain. Please call as soon possible

## 2018-10-11 NOTE — Telephone Encounter (Signed)
Called patient back and she stated she was in the hospital and just got out for pnuem and AFib. She was calling to see if OK to have medication for Oxycodone IR 5mg  po, take 1 pill q6hours PRN pain total of 20 pills given. Informed her that it was ok to have it filled. And would be noted in chart.

## 2018-10-11 NOTE — Telephone Encounter (Signed)
See telephone note. I returned call

## 2018-10-15 DIAGNOSIS — I288 Other diseases of pulmonary vessels: Secondary | ICD-10-CM | POA: Insufficient documentation

## 2018-10-15 DIAGNOSIS — I2510 Atherosclerotic heart disease of native coronary artery without angina pectoris: Secondary | ICD-10-CM | POA: Insufficient documentation

## 2018-10-16 DIAGNOSIS — I480 Paroxysmal atrial fibrillation: Secondary | ICD-10-CM | POA: Insufficient documentation

## 2018-10-23 ENCOUNTER — Encounter: Payer: Self-pay | Admitting: Internal Medicine

## 2018-10-23 ENCOUNTER — Other Ambulatory Visit: Payer: Self-pay

## 2018-10-23 ENCOUNTER — Ambulatory Visit (INDEPENDENT_AMBULATORY_CARE_PROVIDER_SITE_OTHER): Payer: Medicare Other | Admitting: Internal Medicine

## 2018-10-23 VITALS — BP 138/80 | HR 88 | Temp 97.2°F | Resp 16 | Ht 64.0 in | Wt 282.0 lb

## 2018-10-23 DIAGNOSIS — Z6841 Body Mass Index (BMI) 40.0 and over, adult: Secondary | ICD-10-CM | POA: Diagnosis not present

## 2018-10-23 DIAGNOSIS — Z9989 Dependence on other enabling machines and devices: Secondary | ICD-10-CM

## 2018-10-23 DIAGNOSIS — J4520 Mild intermittent asthma, uncomplicated: Secondary | ICD-10-CM | POA: Diagnosis not present

## 2018-10-23 DIAGNOSIS — K219 Gastro-esophageal reflux disease without esophagitis: Secondary | ICD-10-CM | POA: Diagnosis not present

## 2018-10-23 DIAGNOSIS — G4733 Obstructive sleep apnea (adult) (pediatric): Secondary | ICD-10-CM | POA: Diagnosis not present

## 2018-10-23 NOTE — Progress Notes (Signed)
[facility] [address] [place], [place] [address]  Pulmonary Sleep Medicine   Office Visit Note  Patient Name: Laura [name] DOB: [birth year] MRN [id number]  Date of Service: 10/23/2018  Complaints/HPI: PT is here for pulmonary follow up.  She reports that two weeks ago she was hospitalized for PNA and sepsis. Overall she is doing well since discharge.  She is no longer on antibiotics.  She reports she is almost back to her pre-hospital self.    ROS  General: (-) fever, (-) chills, (-) night sweats, (-) weakness Skin: (-) rashes, (-) itching,. Eyes: (-) visual changes, (-) redness, (-) itching. Nose and Sinuses: (-) nasal stuffiness or itchiness, (-) postnasal drip, (-) nosebleeds, (-) sinus trouble. Mouth and Throat: (-) sore throat, (-) hoarseness. Neck: (-) swollen glands, (-) enlarged thyroid, (-) neck pain. Respiratory: - cough, (-) bloody sputum, - shortness of breath, - wheezing. Cardiovascular: - ankle swelling, (-) chest pain. Lymphatic: (-) lymph node enlargement. Neurologic: (-) numbness, (-) tingling. Psychiatric: (-) anxiety, (-) depression   Current Medication: Outpatient Encounter Medications as of 10/23/2018  Medication Sig Note  . amLODipine (NORVASC) 10 MG tablet TAKE 0.5 TABLETS BY MOUTH ONCE NIGHTLY   . aspirin EC 81 MG tablet Take by mouth.   . cefdinir (OMNICEF) 300 MG capsule Take 1 capsule (300 mg total) by mouth 2 (two) times daily.   . chlorthalidone (HYGROTON) 25 MG tablet TAKE 1/2 TABLET (12.5MG ) BY MOUTH EVERY DAY   . Cholecalciferol (VITAMIN D) 2000 UNITS tablet Take by mouth daily.    . DULoxetine (CYMBALTA) 60 MG capsule TAKE 1 CAPSULE (60 MG TOTAL) BY MOUTH 2 (TWO) TIMES DAILY.   . folic acid (FOLVITE) 1 MG tablet Take 1 mg by mouth daily.   . hydroxychloroquine (PLAQUENIL) 200 MG tablet Take 100 mg by mouth daily.    Marland Kitchen inFLIXimab (REMICADE) 100 MG injection Inject 100 mg into the vein every 8 (eight) weeks.   Marland Kitchen  levothyroxine (SYNTHROID) 50 MCG tablet Take 50 mcg by mouth at bedtime.    Marland Kitchen levothyroxine (SYNTHROID, LEVOTHROID) 200 MCG tablet TAKE 1 TABLET ONCE DAILY- ON AN EMPTY STOMACH WITH A GLASS OF WATER 30-60 MINUTES BEFORE BREAKFAST   . loratadine (CLARITIN) 10 MG tablet Take 10 mg by mouth daily.    . methotrexate (RHEUMATREX) 2.5 MG tablet TAKE 4 TABLETS (10 MG TOTAL) BY MOUTH EVERY 7 (SEVEN) DAYS WITH A MEAL 10/06/2018: Sundays   . metoprolol succinate (TOPROL-XL) 100 MG 24 hr tablet Take 1 tablet (100 mg total) by mouth daily. Take with or immediately following a meal.   . Multiple Vitamin (MULTI-VITAMINS) TABS Take by mouth.   . oxyCODONE (OXY IR/ROXICODONE) 5 MG immediate release tablet Take 1 tablet (5 mg total) by mouth every 6 (six) hours as needed for moderate pain or severe pain.   Marland Kitchen QUEtiapine (SEROQUEL) 300 MG tablet TAKE 1 TABLET BY MOUTH EVERYDAY AT BEDTIME   . rosuvastatin (CRESTOR) 5 MG tablet Take 5 mg by mouth 2 (two) times a week.   . spironolactone (ALDACTONE) 25 MG tablet TAKE 1/2 (ONE-HALF) TABLET BY MOUTH DAILY    No facility-administered encounter medications on file as of 10/23/2018.     Surgical History: Past Surgical History:  Procedure Laterality Date  . ABDOMINAL HYSTERECTOMY    . CESAREAN SECTION    . HIP SURGERY Right   . REPLACEMENT TOTAL KNEE Left     Medical History: Past Medical History:  Diagnosis Date  . Allergy   .  Anxiety   . Arthritis, degenerative 10/08/2013   Overview:    a.  Lumbar spine/spinal stenosis/foot drop.   b.  Hands.   . Chronic kidney disease   . Depression   . Lumbar spinal stenosis with neurogenic claudication 74/28/2016  . Major depression, single episode, in complete remission (HCC) 06/25/2015  . Memory loss, short term 03/19/2014  . Seizure (HCC) 10/07/2014  . Sleep apnea   . Sleep apnea   . Thyroid disease     Family History: Family History  Problem Relation Age of Onset  . Hypertension Mother   . Stroke Mother   .  Heart attack Father   . Alcohol abuse Father   . Depression Father   . Post-traumatic stress disorder Father   . Rheum arthritis Sister   . Hypertension Sister   . Depression Sister   . Breast cancer Neg Hx     Social History: Social History   Socioeconomic History  . Marital status: Married    Spouse name: Not on file  . Number of children: Not on file  . Years of education: Not on file  . Highest education level: Not on file  Occupational History    Comment: retired  Engineer, productionocial Needs  . Financial resource strain: Not hard at all  . Food insecurity    Worry: Never true    Inability: Never true  . Transportation needs    Medical: No    Non-medical: No  Tobacco Use  . Smoking status: Never Smoker  . Smokeless tobacco: Never Used  Substance and Sexual Activity  . Alcohol use: No    Alcohol/week: 0.0 standard drinks  . Drug use: No  . Sexual activity: Not Currently  Lifestyle  . Physical activity    Days per week: 0 days    Minutes per session: 0 min  . Stress: Not at all  Relationships  . Social connections    Talks on phone: More than three times a week    Gets together: Once a week    Attends religious service: Never    Active member of club or organization: No    Attends meetings of clubs or organizations: Never    Relationship status: Married  . Intimate partner violence    Fear of current or ex partner: No    Emotionally abused: No    Physically abused: No    Forced sexual activity: No  Other Topics Concern  . Not on file  Social History Narrative  . Not on file    Vital Signs: Blood pressure 138/80, pulse 88, temperature (!) 97.2 F (36.2 C), resp. rate 16, height 5\' 4"  (1.626 m), weight 282 lb (127.9 kg), SpO2 96 %.  Examination: General Appearance: The patient is well-developed, well-nourished, and in no distress. Skin: Gross inspection of skin unremarkable. Head: normocephalic, no gross deformities. Eyes: no gross deformities noted. ENT: ears  appear grossly normal no exudates. Neck: Supple. No thyromegaly. No LAD. Respiratory: clear bilaterally. Cardiovascular: Normal S1 and S2 without murmur or rub. Extremities: No cyanosis. pulses are equal. Neurologic: Alert and oriented. No involuntary movements.  LABS: Recent Results (from the past 2160 hour(s))  Brain natriuretic peptide     Status: Abnormal   Collection Time: 10/06/18  2:44 PM  Result Value Ref Range   B Natriuretic Peptide 973.0 (H) 0.0 - 100.0 pg/mL    Comment: Performed at [facility], [address] [place], [place] [address]  CBC with Differential/Platelet  Status: Abnormal   Collection Time: 10/06/18  2:44 PM  Result Value Ref Range   WBC 25.7 (H) 4.0 - 10.5 K/uL    Comment: WHITE COUNT CONFIRMED ON SMEAR   RBC 3.97 3.87 - 5.11 MIL/uL   Hemoglobin 11.9 (L) 12.0 - 15.0 g/dL   HCT 35.8 (L) 36.0 - 46.0 %   MCV 90.2 80.0 - 100.0 fL   MCH 30.0 26.0 - 34.0 pg   MCHC 33.2 30.0 - 36.0 g/dL   RDW 14.8 11.5 - 15.5 %   Platelets 207 150 - 400 K/uL   Neutrophils Relative % 90 %   Neutro Abs 22.9 (H) 1.7 - 7.7 K/uL   Lymphocytes Relative 4 %   Lymphs Abs 1.1 0.7 - 4.0 K/uL   Monocytes Relative 5 %   Monocytes Absolute 1.3 (H) 0.1 - 1.0 K/uL   Eosinophils Relative 0 %   Eosinophils Absolute 0.0 0.0 - 0.5 K/uL   Basophils Relative 0 %   Basophils Absolute 0.1 0.0 - 0.1 K/uL   WBC Morphology MORPHOLOGY UNREMARKABLE    RBC Morphology MORPHOLOGY UNREMARKABLE    Smear Review Normal platelet morphology    Immature Granulocytes 1 %   Abs Immature Granulocytes 0.34 (H) 0.00 - 0.07 K/uL    Comment: Performed at [facility], [address] [place], [place] [address]  Comprehensive metabolic panel     Status: Abnormal   Collection Time: 10/06/18  2:44 PM  Result Value Ref Range   Sodium 132 (L) 135 - 145 mmol/L   Potassium 3.0 (L) 3.5 - 5.1 mmol/L   Chloride 87 (L) 98 - 111 mmol/L   CO2 29 22 - 32 mmol/L   Glucose, Bld 175 (H) 70 - 99 mg/dL    BUN 28 (H) 8 - 23 mg/dL   Creatinine, Ser 1.46 (H) 0.44 - 1.00 mg/dL   Calcium 9.1 8.9 - 10.3 mg/dL   Total Protein 7.0 6.5 - 8.1 g/dL   Albumin 4.0 3.5 - 5.0 g/dL   AST 39 15 - 41 U/L   ALT 24 0 - 44 U/L   Alkaline Phosphatase 70 38 - 126 U/L   Total Bilirubin 1.0 0.3 - 1.2 mg/dL   GFR calc non Af Amer 35 (L) >60 mL/min   GFR calc Af Amer 41 (L) >60 mL/min   Anion gap 16 (H) 5 - 15    Comment: Performed at [facility], [address] [place], [place] [address]  Troponin I (High Sensitivity)     Status: Abnormal   Collection Time: 10/06/18  2:44 PM  Result Value Ref Range   Troponin I (High Sensitivity) 50 (H) <18 ng/L    Comment: (NOTE) Elevated high sensitivity troponin I (hsTnI) values and significant  changes across serial measurements may suggest ACS but many other  chronic and acute conditions are known to elevate hsTnI results.  Refer to the "Links" section for chest pain algorithms and additional  guidance. Performed at [facility], [address] [place], [place] [address]   Lactic acid, plasma     Status: None   Collection Time: 10/06/18  2:44 PM  Result Value Ref Range   Lactic Acid, Venous 1.6 0.5 - 1.9 mmol/L    Comment: Performed at [facility], [address] [place], [place] [address]  Blood gas, venous     Status: Abnormal (Preliminary result)   Collection Time: 10/06/18  2:55 PM  Result Value Ref Range   pH, Lawson Fiscal  7.46 (H) 7.250 - 7.430   pCO2, Ven 50 44.0 - 60.0 mmHg   pO2, Ven PENDING 32.0 - 45.0 mmHg   Bicarbonate 35.6 (H) 20.0 - 28.0 mmol/L   Acid-Base Excess 10.1 (H) 0.0 - 2.0 mmol/L   O2 Saturation 47.3 %   Patient temperature 37.0    Collection site VEIN    Sample type VEIN     Comment: Performed at [facility], [address] [place], [place] [address]  SARS Coronavirus 2 [facility] order, Performed in [facility] hospital lab) Nasopharyngeal Nasopharyngeal Swab     Status: None   Collection Time:  10/06/18  3:38 PM   Specimen: Nasopharyngeal Swab  Result Value Ref Range   SARS Coronavirus 2 NEGATIVE NEGATIVE    Comment: (NOTE) If result is NEGATIVE SARS-CoV-2 target nucleic acids are NOT DETECTED. The SARS-CoV-2 RNA is generally detectable in upper and lower  respiratory specimens during the acute phase of infection. The lowest  concentration of SARS-CoV-2 viral copies this assay can detect is 250  copies / mL. A negative result does not preclude SARS-CoV-2 infection  and should not be used as the sole basis for treatment or other  patient management decisions.  A negative result may occur with  improper specimen collection / handling, submission of specimen other  than nasopharyngeal swab, presence of viral mutation(s) within the  areas targeted by this assay, and inadequate number of viral copies  (<250 copies / mL). A negative result must be combined with clinical  observations, patient history, and epidemiological information. If result is POSITIVE SARS-CoV-2 target nucleic acids are DETECTED. The SARS-CoV-2 RNA is generally detectable in upper and lower  respiratory specimens dur ing the acute phase of infection.  Positive  results are indicative of active infection with SARS-CoV-2.  Clinical  correlation with patient history and other diagnostic information is  necessary to determine patient infection status.  Positive results do  not rule out bacterial infection or co-infection with other viruses. If result is PRESUMPTIVE POSTIVE SARS-CoV-2 nucleic acids MAY BE PRESENT.   A presumptive positive result was obtained on the submitted specimen  and confirmed on repeat testing.  While 2019 novel coronavirus  (SARS-CoV-2) nucleic acids may be present in the submitted sample  additional confirmatory testing may be necessary for epidemiological  and / or clinical management purposes  to differentiate between  SARS-CoV-2 and other Sarbecovirus currently known to infect humans.   If clinically indicated additional testing with an alternate test  methodology [phone]) is advised. The SARS-CoV-2 RNA is generally  detectable in upper and lower respiratory sp ecimens during the acute  phase of infection. The expected result is Negative. Fact Sheet for Patients:  BoilerBrush.com.cy Fact Sheet for Healthcare Providers: https://pope.com/ This test is not yet approved or cleared by the [place] FDA and has been authorized for detection and/or diagnosis of SARS-CoV-2 by FDA under an Emergency Use Authorization (EUA).  This EUA will remain in effect (meaning this test can be used) for the duration of the COVID-19 declaration under Section 564(b)(1) of the Act, 21 U.S.C. section 360bbb-3(b)(1), unless the authorization is terminated or revoked sooner. Performed at [facility], [address] [place], [place] [address]   Culture, blood (routine x 2)     Status: None   Collection Time: 10/06/18  4:29 PM   Specimen: BLOOD  Result Value Ref Range   Specimen Description BLOOD HAND    Special Requests      BOTTLES DRAWN  AEROBIC AND ANAEROBIC Blood Culture results may not be optimal due to an inadequate volume of blood received in culture bottles   Culture      NO GROWTH 5 DAYS Performed at [facility], [address] [place], [place] [address]    Report Status 10/11/2018 FINAL   Lactic acid, plasma     Status: Abnormal   Collection Time: 10/06/18  5:30 PM  Result Value Ref Range   Lactic Acid, Venous 2.1 (HH) 0.5 - 1.9 mmol/L    Comment: CRITICAL RESULT CALLED TO, READ BACK BY AND VERIFIED WITH HUNTER ORE ON 10/06/18 AT 1814 [facility] Performed at [facility], [address] [place], [place] [address]   Culture, blood (routine x 2)     Status: None   Collection Time: 10/06/18  5:30 PM   Specimen: BLOOD  Result Value Ref Range   Specimen Description BLOOD RT HAND    Special Requests       BOTTLES DRAWN AEROBIC AND ANAEROBIC Blood Culture results may not be optimal due to an inadequate volume of blood received in culture bottles   Culture      NO GROWTH 5 DAYS Performed at [facility], [address], [place], [place] [address]    Report Status 10/11/2018 FINAL   Troponin I (High Sensitivity)     Status: Abnormal   Collection Time: 10/06/18  5:30 PM  Result Value Ref Range   Troponin I (High Sensitivity) 41 (H) <18 ng/L    Comment: (NOTE) Elevated high sensitivity troponin I (hsTnI) values and significant  changes across serial measurements may suggest ACS but many other  chronic and acute conditions are known to elevate hsTnI results.  Refer to the "Links" section for chest pain algorithms and additional  guidance. Performed at [facility], [address] [place], [place] [address]   MRSA PCR Screening     Status: None   Collection Time: 10/06/18 10:20 PM   Specimen: Nasopharyngeal  Result Value Ref Range   MRSA by PCR NEGATIVE NEGATIVE    Comment:        The GeneXpert MRSA Assay (FDA approved for NASAL specimens only), is one component of a comprehensive MRSA colonization surveillance program. It is not intended to diagnose MRSA infection nor to guide or monitor treatment for MRSA infections. Performed at [facility], [address] [place], [place] [address]   HIV antibody (Routine Screening)     Status: None   Collection Time: 10/07/18  5:16 AM  Result Value Ref Range   HIV Screen 4th Generation wRfx Non Reactive Non Reactive    Comment: (NOTE) Performed At: [facility] [address] [place], [place] [id number] Jolene Schimke MD XL:[id number]   Basic metabolic panel     Status: Abnormal   Collection Time: 10/07/18  5:16 AM  Result Value Ref Range   Sodium 137 135 - 145 mmol/L   Potassium 2.5 (LL) 3.5 - 5.1 mmol/L    Comment: CRITICAL RESULT CALLED TO, READ BACK BY AND VERIFIED WITH KIM OLIVER   10/07/18 AKT   Chloride 97 (L) 98 - 111 mmol/L   CO2 30 22 - 32 mmol/L   Glucose, Bld 204 (H) 70 - 99 mg/dL   BUN 25 (H) 8 - 23 mg/dL   Creatinine, Ser 3.66 (H) 0.44 - 1.00 mg/dL   Calcium 8.6 (L) 8.9 - 10.3 mg/dL   GFR calc non Af Amer 43 (L) >60 mL/min   GFR calc  Af Amer 50 (L) >60 mL/min   Anion gap 10 5 - 15    Comment: Performed at [facility], [address] [place], [place] [address]  CBC     Status: Abnormal   Collection Time: 10/07/18  5:16 AM  Result Value Ref Range   WBC 18.7 (H) 4.0 - 10.5 K/uL   RBC 3.73 (L) 3.87 - 5.11 MIL/uL   Hemoglobin 11.1 (L) 12.0 - 15.0 g/dL   HCT 60.4 (L) 54.0 - 98.1 %   MCV 89.5 80.0 - 100.0 fL   MCH 29.8 26.0 - 34.0 pg   MCHC 33.2 30.0 - 36.0 g/dL   RDW 19.1 47.8 - 29.5 %   Platelets 180 150 - 400 K/uL   nRBC 0.0 0.0 - 0.2 %    Comment: Performed at [facility], [address] [place], [place] [address]  Magnesium     Status: None   Collection Time: 10/07/18  5:16 AM  Result Value Ref Range   Magnesium 2.1 1.7 - 2.4 mg/dL    Comment: Performed at [facility], [address] [place], [place] [address]  TSH     Status: None   Collection Time: 10/07/18  5:16 AM  Result Value Ref Range   TSH 0.383 0.350 - 4.500 uIU/mL    Comment: Performed by a 3rd Generation assay with a functional sensitivity of <=0.01 uIU/mL. Performed at [facility], [address] [place], [place] [address]   Lactic acid, plasma     Status: None   Collection Time: 10/07/18  8:28 AM  Result Value Ref Range   Lactic Acid, Venous 1.4 0.5 - 1.9 mmol/L    Comment: Performed at [facility], [address] [place], [place] [address]  Procalcitonin - Baseline     Status: None   Collection Time: 10/07/18  8:28 AM  Result Value Ref Range   Procalcitonin 0.91 ng/mL    Comment:        Interpretation: PCT > 0.5 ng/mL and <= 2 ng/mL: Systemic infection (sepsis) is possible, but other conditions are known to  elevate PCT as well. (NOTE)       Sepsis PCT Algorithm           Lower Respiratory Tract                                      Infection PCT Algorithm    ----------------------------     ----------------------------         PCT < 0.25 ng/mL                PCT < 0.10 ng/mL         Strongly encourage             Strongly discourage   discontinuation of antibiotics    initiation of antibiotics    ----------------------------     -----------------------------       PCT 0.25 - 0.50 ng/mL            PCT 0.10 - 0.25 ng/mL               OR       >80% decrease in PCT            Discourage initiation of  antibiotics      Encourage discontinuation           of antibiotics    ----------------------------     -----------------------------         PCT >= 0.50 ng/mL              PCT 0.26 - 0.50 ng/mL                AND       <80% decrease in PCT             Encourage initiation of                                             antibiotics       Encourage continuation           of antibiotics    ----------------------------     -----------------------------        PCT >= 0.50 ng/mL                  PCT > 0.50 ng/mL               AND         increase in PCT                  Strongly encourage                                      initiation of antibiotics    Strongly encourage escalation           of antibiotics                                     -----------------------------                                           PCT <= 0.25 ng/mL                                                 OR                                        > 80% decrease in PCT                                     Discontinue / Do not initiate                                             antibiotics Performed at [facility], [address] [place], [place] [address]   Potassium     Status: Abnormal   Collection Time: 10/07/18  1:45 PM  Result Value Ref Range   Potassium 3.0  (L) 3.5 - 5.1 mmol/L    Comment: Performed at [facility], [address] [place], [place] [address]  CBC     Status: Abnormal   Collection Time: 10/08/18  5:44 AM  Result Value Ref Range   WBC 8.3 4.0 - 10.5 K/uL   RBC 3.59 (L) 3.87 - 5.11 MIL/uL   Hemoglobin 10.6 (L) 12.0 - 15.0 g/dL   HCT 01.7 (L) 51.0 - 25.8 %   MCV 94.2 80.0 - 100.0 fL   MCH 29.5 26.0 - 34.0 pg   MCHC 31.4 30.0 - 36.0 g/dL   RDW 52.7 78.2 - 42.3 %   Platelets 115 (L) 150 - 400 K/uL    Comment: Immature Platelet Fraction may be clinically indicated, consider ordering this additional test [id number]    nRBC 0.0 0.0 - 0.2 %    Comment: Performed at [facility], [address] [place], [place] [address]  Basic metabolic panel     Status: Abnormal   Collection Time: 10/08/18  5:44 AM  Result Value Ref Range   Sodium 141 135 - 145 mmol/L   Potassium 2.7 (LL) 3.5 - 5.1 mmol/L    Comment: CRITICAL RESULT CALLED TO, READ BACK BY AND VERIFIED WITH PATRICIA KING AT 0703 ON 10/08/2018 JJB    Chloride 98 98 - 111 mmol/L   CO2 32 22 - 32 mmol/L   Glucose, Bld 136 (H) 70 - 99 mg/dL   BUN 21 8 - 23 mg/dL   Creatinine, Ser 6.76 (H) 0.44 - 1.00 mg/dL   Calcium 8.7 (L) 8.9 - 10.3 mg/dL   GFR calc non Af Amer 51 (L) >60 mL/min   GFR calc Af Amer 59 (L) >60 mL/min   Anion gap 11 5 - 15    Comment: Performed at [facility], [address] [place], [place] [address]  Procalcitonin - Baseline     Status: None   Collection Time: 10/08/18  5:44 AM  Result Value Ref Range   Procalcitonin 0.32 ng/mL    Comment:        Interpretation: PCT (Procalcitonin) <= 0.5 ng/mL: Systemic infection (sepsis) is not likely. Local bacterial infection is possible. (NOTE)       Sepsis PCT Algorithm           Lower Respiratory Tract                                      Infection PCT Algorithm    ----------------------------     ----------------------------         PCT < 0.25 ng/mL                PCT < 0.10  ng/mL         Strongly encourage             Strongly discourage   discontinuation of antibiotics    initiation of antibiotics    ----------------------------     -----------------------------       PCT 0.25 - 0.50 ng/mL            PCT 0.10 - 0.25 ng/mL               OR       >80% decrease in PCT            Discourage initiation of  antibiotics      Encourage discontinuation           of antibiotics    ----------------------------     -----------------------------         PCT >= 0.50 ng/mL              PCT 0.26 - 0.50 ng/mL               AND        <80% decrease in PCT             Encourage initiation of                                             antibiotics       Encourage continuation           of antibiotics    ----------------------------     -----------------------------        PCT >= 0.50 ng/mL                  PCT > 0.50 ng/mL               AND         increase in PCT                  Strongly encourage                                      initiation of antibiotics    Strongly encourage escalation           of antibiotics                                     -----------------------------                                           PCT <= 0.25 ng/mL                                                 OR                                        > 80% decrease in PCT                                     Discontinue / Do not initiate                                             antibiotics Performed at [facility], [address], [place], [place] [address]   Ferritin     Status: None   Collection Time: 10/08/18  5:44 AM  Result Value Ref Range   Ferritin 188 11 - 307 ng/mL    Comment: Performed at [facility], [address] [place], [place] [address]  Iron and TIBC     Status: Abnormal   Collection Time: 10/08/18  5:44 AM  Result Value Ref Range   Iron 16 (L) 28 - 170 ug/dL   TIBC 191 478 - 295 ug/dL   Saturation  Ratios 6 (L) 10.4 - 31.8 %   UIBC 238 ug/dL    Comment: Performed at [facility], [address] [place], [place] [address]  Vitamin B12     Status: Abnormal   Collection Time: 10/08/18  5:44 AM  Result Value Ref Range   Vitamin B-12 1,442 (H) 180 - 914 pg/mL    Comment: (NOTE) This assay is not validated for testing neonatal or myeloproliferative syndrome specimens for Vitamin B12 levels. Performed at [facility], [address] [place], [place] [address]   Folate     Status: None   Collection Time: 10/08/18  5:44 AM  Result Value Ref Range   Folate 40.0 >5.9 ng/mL    Comment: Performed at [facility], [address] [place], [place] [address]  Magnesium     Status: None   Collection Time: 10/08/18  5:44 AM  Result Value Ref Range   Magnesium 2.3 1.7 - 2.4 mg/dL    Comment: Performed at [facility], [address] [place], [place] [address]  Potassium     Status: None   Collection Time: 10/08/18  2:52 PM  Result Value Ref Range   Potassium 3.9 3.5 - 5.1 mmol/L    Comment: Performed at [facility], [address] [place], [place] [address]  Basic metabolic panel     Status: Abnormal   Collection Time: 10/09/18  4:18 AM  Result Value Ref Range   Sodium 138 135 - 145 mmol/L   Potassium 4.0 3.5 - 5.1 mmol/L   Chloride 98 98 - 111 mmol/L   CO2 30 22 - 32 mmol/L   Glucose, Bld 216 (H) 70 - 99 mg/dL   BUN 24 (H) 8 - 23 mg/dL   Creatinine, Ser 0.10 (H) 0.44 - 1.00 mg/dL   Calcium 8.7 (L) 8.9 - 10.3 mg/dL   GFR calc non Af Amer 54 (L) >60 mL/min   GFR calc Af Amer >60 >60 mL/min   Anion gap 10 5 - 15    Comment: Performed at [facility], [address] [place], [place] [address]  CBC     Status: Abnormal   Collection Time: 10/09/18  4:18 AM  Result Value Ref Range   WBC 8.1 4.0 - 10.5 K/uL   RBC 3.77 (L) 3.87 - 5.11 MIL/uL   Hemoglobin 11.2 (L) 12.0 - 15.0 g/dL   HCT 66.4 (L) 40.3 - 47.4 %   MCV 91.2 80.0 -  100.0 fL   MCH 29.7 26.0 - 34.0 pg   MCHC 32.6 30.0 - 36.0 g/dL   RDW 25.9 56.3 - 87.5 %   Platelets 225 150 - 400 K/uL   nRBC 0.0 0.0 - 0.2 %    Comment: Performed at [facility], [address] [place], [place] [address]  ECHOCARDIOGRAM COMPLETE     Status: None   Collection Time: 10/09/18 10:10 AM  Result Value Ref Range   Weight 4,783.1 oz   Height 64 in   BP 157/58 mmHg    Radiology: No results  found.  No results found.  Dg Chest 2 View  Result Date: 10/06/2018 CLINICAL DATA:  Shortness of breath. EXAM: CHEST - 2 VIEW COMPARISON:  Radiograph of May 23, 2017. FINDINGS: Stable cardiomegaly. New right upper lobe airspace opacity is noted consistent with pneumonia. Left lung is clear. No pneumothorax or pleural effusion is noted. Bony thorax is unremarkable. IMPRESSION: New right upper lobe airspace opacity is noted consistent with pneumonia. Followup PA and lateral chest X-ray is recommended in 3-4 weeks following trial of antibiotic therapy to ensure resolution and exclude underlying malignancy. Electronically Signed   By: Lupita Raider M.D.   On: 10/06/2018 15:38   Ct Angio Chest Pe W Or Wo Contrast  Result Date: 10/08/2018 CLINICAL DATA:  73 year old female with history of tachycardia and atrial fibrillation. Suspected pulmonary embolism. EXAM: CT ANGIOGRAPHY CHEST WITH CONTRAST TECHNIQUE: Multidetector CT imaging of the chest was performed using the standard protocol during bolus administration of intravenous contrast. Multiplanar CT image reconstructions and MIPs were obtained to evaluate the vascular anatomy. CONTRAST:  75mL OMNIPAQUE IOHEXOL 350 MG/ML SOLN COMPARISON:  No priors. FINDINGS: Comment: Today's study is limited by considerable patient respiratory motion. Cardiovascular: No suspicious central filling defect within the central, lobar or segmental sized pulmonary arterial tree to suggest acute pulmonary embolism. Distal subsegmental sized branches are  incompletely evaluated on portions of the examination secondary to respiratory motion. 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 Heart size is mildly enlarged. There is no significant pericardial fluid, thickening or pericardial calcification. There is aortic atherosclerosis, as well as atherosclerosis of the great vessels of the mediastinum and the coronary arteries, including calcified atherosclerotic plaque in the left main, left anterior descending and left circumflex coronary arteries. Dilatation of the pulmonic trunk (3.9 cm in diameter), suggestive of pulmonary arterial hypertension. Mediastinum/Nodes: No pathologically enlarged mediastinal or hilar lymph nodes. Esophagus is unremarkable in appearance. No axillary lymphadenopathy. Lungs/Pleura: Extensive airspace consolidation in the right upper lobe, compatible with pneumonia. Small right parapneumonic pleural effusion lying dependently. A few scattered tiny 2-4 mm pulmonary nodules in the lungs bilaterally, nonspecific, but statistically likely benign. Upper Abdomen: Aortic atherosclerosis. Musculoskeletal: There are no aggressive appearing lytic or blastic lesions noted in the visualized portions of the skeleton. Review of the MIP images confirms the above findings. IMPRESSION: 1. No evidence of acute pulmonary embolism. There are vascular webs in the distal aspect of the right lower lobe pulmonary artery, likely sequela of remote thromboembolic disease. 2. Right upper lobe pneumonia with small parapneumonic pleural effusion. 3. Mild cardiomegaly. 4. Aortic atherosclerosis, in addition to left main and 2 vessel coronary artery disease. Assessment for potential risk factor modification, dietary therapy or pharmacologic therapy may be warranted, if clinically indicated. 5. Dilatation of the pulmonic trunk (3.9 cm in diameter), concerning for pulmonary arterial hypertension. Aortic  Atherosclerosis (ICD10-I70.0). Electronically Signed   By: Trudie Reed M.D.   On: 10/08/2018 16:41      Assessment and Plan: Patient Active Problem List   Diagnosis Date Noted  . Pneumonia 10/06/2018  . Immunosuppression (HCC) 09/18/2018  . Pharmacologic therapy 07/11/2018  . Disorder of skeletal system 07/11/2018  . Problems influencing health status 07/11/2018  . MDD (major depressive disorder), recurrent episode, mild (HCC) 07/09/2018  . Primary insomnia 07/09/2018  . Bereavement 07/09/2018  . Body mass index (BMI) of 40.0-44.9 in adult (HCC) 10/09/2017  . Postmenopausal 04/06/2017  . Chronic arthralgias of knees and hips (Right)  08/18/2016  . Arthritis 07/11/2016  . Dyspnea on exertion 07/11/2016  . Kidney disease 07/11/2016  . 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 Snoring 07/11/2016  . Morbid obesity with BMI of 40.0-44.9, adult (HCC) 07/11/2016  . Opioid-induced constipation (OIC) 04/21/2016  . Osteoarthritis of knee (Right) 01/25/2016  . Diet-controlled diabetes mellitus (HCC) 10/03/2015  . GAD (generalized anxiety disorder) 09/24/2015  . Disturbance of skin sensation 07/15/2015  . Elevated sedimentation rate 07/15/2015  . Elevated C-reactive protein (CRP) 07/15/2015  . Chronic hip pain (Left) 04/06/2015  . History of TKR (total knee replacement) (Left) 02/09/2015  . History of femur fracture (Right) 02/09/2015  . 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 Lumbar foraminal stenosis (Bilateral L3-4 and L5-S1) 02/09/2015  . Chronic low back pain (Primary Area of Pain) (Bilateral) (L>R) 01/22/2015  . Chronic pain syndrome 11/25/2014  . Opioid dependence (HCC) 74/28/2016  . Lumbar spinal stenosis (5 mm Severe L3-4; 8 mm L4-5) 74/28/2016  . Lumbar spondylosis 74/28/2016  . Lumbar facet syndrome (Bilateral) (L>R) 74/28/2016  . Chronic knee pain (Right) 11/05/2014  . Opiate use (30 MME/Day)  11/05/2014  . Long term current use of opiate analgesic 11/05/2014  . Long term prescription opiate use 11/05/2014  . Encounter for therapeutic drug level monitoring 11/05/2014  . Morbid obesity (HCC) 10/27/2014  . Extreme obesity (HCC) 10/07/2014  . Type 2 diabetes mellitus (HCC) 03/19/2014  . Depressive disorder 10/14/2013  . Essential (primary) hypertension 10/14/2013  . Insomnia secondary to chronic pain 07/09/2013  . Anxiety 07/09/2013  . GERD (gastroesophageal reflux disease) 01/18/2013  . Hypothyroidism 01/18/2013  . Hypercholesterolemia 01/18/2013  . 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 CKD (chronic kidney disease) stage 3, GFR 30-59 ml/min (HCC) 01/17/2013  . Congestive heart failure with left ventricular systolic dysfunction (HCC) 11/10/2012    1. OSA on CPAP Controlled, continue current management.   2. Mild intermittent asthma without complication Doing well at this time, not currently using inhalers.   3. Gastroesophageal reflux disease without esophagitis Stable, continue present management.   4. Body mass index (BMI) of 40.0-44.9 in adult [facility]) Obesity Counseling: Risk Assessment: An assessment of behavioral risk factors was made today and includes lack of exercise sedentary lifestyle, lack of portion control and poor dietary habits.  Risk Modification Advice: She was counseled on portion control guidelines. Restricting daily caloric intake to. . The detrimental long term effects of obesity on her health and ongoing poor compliance was also discussed with the patient.    General Counseling: I have discussed the findings of the evaluation and examination with Lanora Manis.  I have also discussed any further diagnostic evaluation thatmay be needed or ordered today. Kaylynne verbalizes understanding of the findings of todays visit. We also reviewed her medications today and discussed drug interactions and side effects including but not limited excessive  drowsiness and altered mental states. We also discussed that there is always a risk not just to her but also people around her. she has been encouraged to call the office with any questions or concerns that should arise related to todays visit.    Time spent: 15 This patient was seen by Blima Ledger AGNP-C in Collaboration with Dr. Freda Munro as a part of collaborative care agreement.   I have personally obtained a history, examined the patient, evaluated laboratory and imaging results, formulated the assessment and plan and placed orders.    Yevonne Pax, MD [facility] Pulmonary and Critical  Care Sleep medicine

## 2018-10-24 ENCOUNTER — Other Ambulatory Visit: Payer: Self-pay | Admitting: Psychiatry

## 2018-10-24 ENCOUNTER — Ambulatory Visit: Payer: Medicare Other | Admitting: Licensed Clinical Social Worker

## 2018-10-24 DIAGNOSIS — F3342 Major depressive disorder, recurrent, in full remission: Secondary | ICD-10-CM

## 2018-10-31 ENCOUNTER — Other Ambulatory Visit: Payer: Self-pay | Admitting: Internal Medicine

## 2018-11-04 ENCOUNTER — Other Ambulatory Visit: Payer: Self-pay | Admitting: Internal Medicine

## 2018-11-14 ENCOUNTER — Other Ambulatory Visit: Payer: Self-pay | Admitting: Psychiatry

## 2018-11-14 DIAGNOSIS — F330 Major depressive disorder, recurrent, mild: Secondary | ICD-10-CM

## 2018-11-14 DIAGNOSIS — F5101 Primary insomnia: Secondary | ICD-10-CM

## 2018-11-15 LAB — BLOOD GAS, VENOUS
Acid-Base Excess: 10.1 mmol/L — ABNORMAL HIGH (ref 0.0–2.0)
Bicarbonate: 35.6 mmol/L — ABNORMAL HIGH (ref 20.0–28.0)
O2 Saturation: 47.3 %
Patient temperature: 37
pCO2, Ven: 50 mmHg (ref 44.0–60.0)
pH, Ven: 7.46 — ABNORMAL HIGH (ref 7.250–7.430)

## 2018-11-22 ENCOUNTER — Other Ambulatory Visit: Payer: Self-pay

## 2018-11-22 ENCOUNTER — Ambulatory Visit (INDEPENDENT_AMBULATORY_CARE_PROVIDER_SITE_OTHER): Payer: Medicare Other | Admitting: Psychiatry

## 2018-11-22 ENCOUNTER — Encounter: Payer: Self-pay | Admitting: Psychiatry

## 2018-11-22 DIAGNOSIS — F5101 Primary insomnia: Secondary | ICD-10-CM

## 2018-11-22 DIAGNOSIS — F3342 Major depressive disorder, recurrent, in full remission: Secondary | ICD-10-CM | POA: Diagnosis not present

## 2018-11-22 NOTE — Progress Notes (Signed)
Virtual Visit via Telephone Note  I connected with Laura [name] on 11/22/18 at  4:45 PM EST by telephone and verified that I am speaking with the correct person using two identifiers.   I discussed the limitations, risks, security and privacy concerns of performing an evaluation and management service by telephone and the availability of in person appointments. I also discussed with the patient that there may be a patient responsible charge related to this service. The patient expressed understanding and agreed to proceed.    I discussed the assessment and treatment plan with the patient. The patient was provided an opportunity to ask questions and all were answered. The patient agreed with the plan and demonstrated an understanding of the instructions.   The patient was advised to call back or seek an in-person evaluation if the symptoms worsen or if the condition fails to improve as anticipated.   [place] MD OP Progress Note  11/22/2018 5:13 PM Laura [name]  MRN:  [id number]  Chief Complaint:  Chief Complaint    Follow-up     HPI: Laura [name] is a 73 year old Caucasian female who has a history of MDD, primary insomnia, chronic pain, hyperlipidemia was evaluated by phone today.  Patient today reports she was recently admitted to the hospital for pneumonia.  She recovered well and is currently doing okay.  Patient also developed atrial fibrillation and hypokalemia and needed Holter monitor.  She however improved and had cardiology follow-up recently.  Patient had echocardiogram done.  Patient is currently being followed by cardiology. I have reviewed medical records per Dr.'s Doristine Mango -dated 11/15/2018-" patient advised to continue amlodipine, chlorthalidone and metoprolol for her blood pressure, continue aspirin and lovastatin.  For paroxysmal atrial fibrillation in the setting of pneumonia and severe hypokalemia-Holter monitor negative for evidence of atrial fibrillation, we will  continue with current medications and follow for breakthrough A. Fib.'  Patient reports she is currently doing okay mood wise.  She denies any significant depression or anxiety symptoms.  She reports sleep is good on the Seroquel.  Patient denies any suicidality, homicidality or perceptual disturbances.  Patient appeared to be alert, oriented to person place time and situation.  She however reports she does struggle with some short-term memory loss, currently working with her primary care provider and may need a neurology referral in the future.     Visit Diagnosis:    ICD-10-CM   1. MDD (major depressive disorder), recurrent, in full remission ([place])  F33.42   2. Primary insomnia  F51.01     Past Psychiatric History: I have reviewed past psychiatric history from my progress note on 06/13/2018  Past Medical History:  Past Medical History:  Diagnosis Date  . Allergy   . Anxiety   . Arthritis, degenerative 10/08/2013   Overview:    a.  Lumbar spine/spinal stenosis/foot drop.   b.  Hands.   . Chronic kidney disease   . Depression   . 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 Major depression, single episode, in complete remission ([place]) 06/25/2015  . Memory loss, short term 03/19/2014  . Seizure ([place]) 10/07/2014  . Sleep apnea   . Sleep apnea   . Thyroid disease     Past Surgical History:  Procedure Laterality Date  . ABDOMINAL HYSTERECTOMY    . CESAREAN SECTION    . HIP SURGERY Right   . REPLACEMENT TOTAL KNEE Left     Family Psychiatric History: Reviewed family psychiatric history from my progress note on 06/13/2018  Family History:  Family History  Problem Relation Age of Onset  . Hypertension Mother   . Stroke Mother   . Heart attack Father   . Alcohol abuse Father   . Depression Father   . Post-traumatic stress disorder Father   . Rheum arthritis Sister   . Hypertension Sister   . Depression Sister   . Breast cancer Neg Hx     Social  History: Reviewed social history from my progress note on 06/13/2018 Social History   Socioeconomic History  . Marital status: Married    Spouse name: Not on file  . Number of children: Not on file  . Years of education: Not on file  . Highest education level: Not on file  Occupational History    Comment: retired  Engineer, productionocial Needs  . Financial resource strain: Not hard at all  . Food insecurity    Worry: Never true    Inability: Never true  . Transportation needs    Medical: No    Non-medical: No  Tobacco Use  . Smoking status: Never Smoker  . Smokeless tobacco: Never Used  Substance and Sexual Activity  . Alcohol use: No    Alcohol/week: 0.0 standard drinks  . Drug use: No  . Sexual activity: Not Currently  Lifestyle  . Physical activity    Days per week: 0 days    Minutes per session: 0 min  . Stress: Not at all  Relationships  . Social connections    Talks on phone: More than three times a week    Gets together: Once a week    Attends religious service: Never    Active member of club or organization: No    Attends meetings of clubs or organizations: Never    Relationship status: Married  Other Topics Concern  . Not on file  Social History Narrative  . Not on file    Allergies:  Allergies  Allergen Reactions  . Bupropion   . Penicillins Rash    Metabolic Disorder Labs: No results found for: HGBA1C, MPG No results found for: PROLACTIN No results found for: CHOL, TRIG, HDL, CHOLHDL, VLDL, LDLCALC Lab Results  Component Value Date   TSH 0.383 10/07/2018    Therapeutic Level Labs: No results found for: LITHIUM No results found for: VALPROATE No components found for:  CBMZ  Current Medications: Current Outpatient Medications  Medication Sig Dispense Refill  . aspirin EC 81 MG tablet Take by mouth.    . chlorthalidone (HYGROTON) 25 MG tablet TAKE 1/2 TABLET (12.5MG ) BY MOUTH EVERY DAY  11  . Cholecalciferol (VITAMIN D) 2000 UNITS tablet Take by mouth  daily.     Marland Kitchen. docusate sodium (COLACE) 100 MG capsule Take 100 mg by mouth 2 (two) times daily.    . DULoxetine (CYMBALTA) 60 MG capsule TAKE 1 CAPSULE (60 MG TOTAL) BY MOUTH 2 (TWO) TIMES DAILY. 180 capsule 0  . folic acid (FOLVITE) 1 MG tablet Take 1 mg by mouth daily.    . hydroxychloroquine (PLAQUENIL) 200 MG tablet Take by mouth.    . inFLIXimab (REMICADE) 100 MG injection Inject 100 mg into the vein every 8 (eight) weeks.    Marland Kitchen. levothyroxine (SYNTHROID) 50 MCG tablet Take 50 mcg by mouth at bedtime.     Marland Kitchen. levothyroxine (SYNTHROID, LEVOTHROID) 200 MCG tablet TAKE 1 TABLET ONCE DAILY- ON AN EMPTY STOMACH WITH A GLASS OF WATER 30-60 MINUTES BEFORE BREAKFAST  5  . loratadine (CLARITIN) 10 MG tablet Take 10 mg  by mouth daily.     . methotrexate (RHEUMATREX) 2.5 MG tablet TAKE 4 TABLETS (10 MG TOTAL) BY MOUTH EVERY 7 (SEVEN) DAYS WITH A MEAL  5  . metoprolol succinate (TOPROL-XL) 100 MG 24 hr tablet Take 1 tablet (100 mg total) by mouth daily. Take with or immediately following a meal. 30 tablet 0  . metoprolol succinate (TOPROL-XL) 100 MG 24 hr tablet Take by mouth.    . Multiple Vitamin (MULTI-VITAMINS) TABS Take by mouth.    . oxyCODONE (OXY IR/ROXICODONE) 5 MG immediate release tablet Take 1 tablet (5 mg total) by mouth every 6 (six) hours as needed for moderate pain or severe pain. 20 tablet 0  . QUEtiapine (SEROQUEL) 300 MG tablet TAKE 1 TABLET BY MOUTH EVERYDAY AT BEDTIME 90 tablet 1  . simvastatin (ZOCOR) 10 MG tablet Take 10 mg by mouth daily.    Marland Kitchen spironolactone (ALDACTONE) 25 MG tablet TAKE 1/2 (ONE-HALF) TABLET BY MOUTH DAILY  11  . valACYclovir (VALTREX) 1000 MG tablet PLEASE SEE ATTACHED FOR DETAILED DIRECTIONS    . amLODipine (NORVASC) 10 MG tablet TAKE 0.5 TABLETS BY MOUTH ONCE NIGHTLY    . cefdinir (OMNICEF) 300 MG capsule Take 1 capsule (300 mg total) by mouth 2 (two) times daily. (Patient not taking: Reported on 11/22/2018) 5 capsule 0  . hydroxychloroquine (PLAQUENIL) 200 MG  tablet Take 100 mg by mouth daily.     Marland Kitchen liraglutide (VICTOZA) 18 MG/3ML SOPN Inject into the skin.    . metoprolol succinate (TOPROL-XL) 50 MG 24 hr tablet Take 50 mg by mouth daily.     No current facility-administered medications for this visit.      Musculoskeletal: Strength & Muscle Tone: UTA Gait & Station: UTA Patient leans: N/A  Psychiatric Specialty Exam: Review of Systems  Psychiatric/Behavioral: Negative for depression, hallucinations, substance abuse and suicidal ideas. The patient is not nervous/anxious and does not have insomnia.   All other systems reviewed and are negative.   There were no vitals taken for this visit.There is no height or weight on file to calculate BMI.  General Appearance: UTA  Eye Contact:  UTA  Speech:  Clear and Coherent  Volume:  Normal  Mood:  Euthymic  Affect:  UTA  Thought Process:  Goal Directed and Descriptions of Associations: Intact  Orientation:  Full (Time, Place, and Person)  Thought Content: Logical   Suicidal Thoughts:  No  Homicidal Thoughts:  No  Memory:  Immediate;   Fair Recent;   Fair Remote;   Fair  Judgement:  Fair  Insight:  Fair  Psychomotor Activity:  UTA  Concentration:  Concentration: Fair and Attention Span: Fair  Recall:  [employer] of Knowledge: Fair  Language: Fair  Akathisia:  No  Handed:  Right  AIMS (if indicated): Denies tremors, rigidity  Assets:  Engineer, maintenance Social Support Talents/Skills Transportation Vocational/Educational  ADL's:  Intact  Cognition: WNL  Sleep:  Fair   Screenings: PHQ2-9     Office Visit from 03/14/2018 in [facility] Office Visit from 06/19/2017 in [facility] Office Visit from 05/29/2017 in [facility] Office Visit from 04/18/2017 in [facility] Clinical Support from 04/13/2017 in [facility]  PHQ-2 Total Score  0  0  2   0  0       Assessment and Plan: Laura [name] is a 73 year old Caucasian female, married, retired, lives in [place], has  a history of depression, currently in remission, primary insomnia, hypertension, congestive heart failure, GERD, diabetes melitis, hypothyroidism, rheumatoid arthritis, chronic pain, history of knee replacement was evaluated by phone today.  Patient is currently making progress on the current medication regimen.  Plan MDD-stable Cymbalta 120 mg p.o. daily divided dosage Seroquel 300 mg p.o. nightly. Patient wants to stay on seroquel and did not tolerate tapering off of seroquel in the past. Patient was referred for CBT.  Insomnia-improving Seroquel as prescribed  I have reviewed cardiology notes-per Dr. Zonia KiefStephens, Nicole-dated 11/15/2018 as summarized above.  Follow-up in clinic in 2 to 3 months or sooner if needed.  February 4 at 4:40 PM  I have spent atleast 15 minutes non face to face with patient today. More than 50 % of the time was spent for psychoeducation and supportive psychotherapy and care coordination. This note was generated in part or whole with voice recognition software. Voice recognition is usually quite accurate but there are transcription errors that can and very often do occur. I apologize for any typographical errors that were not detected and corrected.            Jomarie LongsSaramma Johann Santone, MD 11/22/2018, 5:13 PM

## 2018-12-13 ENCOUNTER — Encounter: Payer: Self-pay | Admitting: Licensed Clinical Social Worker

## 2018-12-13 ENCOUNTER — Other Ambulatory Visit: Payer: Self-pay

## 2018-12-13 ENCOUNTER — Ambulatory Visit (INDEPENDENT_AMBULATORY_CARE_PROVIDER_SITE_OTHER): Payer: Medicare Other | Admitting: Licensed Clinical Social Worker

## 2018-12-13 DIAGNOSIS — F3342 Major depressive disorder, recurrent, in full remission: Secondary | ICD-10-CM | POA: Diagnosis not present

## 2018-12-13 NOTE — Progress Notes (Signed)
Virtual Visit via Telephone Note  I connected with Laura [name] on 12/13/18 at  2:30 PM EST by telephone and verified that I am speaking with the correct person using two identifiers.   I discussed the limitations, risks, security and privacy concerns of performing an evaluation and management service by telephone and the availability of in person appointments. I also discussed with the patient that there may be a patient responsible charge related to this service. The patient expressed understanding and agreed to proceed.  I discussed the assessment and treatment plan with the patient. The patient was provided an opportunity to ask questions and all were answered. The patient agreed with the plan and demonstrated an understanding of the instructions.   The patient was advised to call back or seek an in-person evaluation if the symptoms worsen or if the condition fails to improve as anticipated.  I provided  minutes of non-face-to-face time during this encounter.   Alden Hipp, LCSW    THERAPIST PROGRESS NOTE  Session Time: 1430  Participation Level: Active  Behavioral Response: NeatAlertNA  Type of Therapy: Individual Therapy  Treatment Goals addressed: Coping  Interventions: Supportive  Summary: Laura [name] is a 73 y.o. female who presents with continued symptoms related to her diagnosis. Laura [name] reports doing well since our last session, but noted some things have happened since our last session. Laura [name] reports a good friend committed suicide two months ago and she had spoken to him that day. Laura [name] reports worrying that she did not say the right things to him on the phone when she knew he was upset, but noted he did not mention feeling suicidal on the phone. LCSW validated Ilissa's feelings and encouraged her to recognize there was nothing she could have said/not said that would have pushed him to that point. Laura [name] was able to understand this point and  expressed agreement. Laura [name] went on to discussing her interpersonal relationships and issues within them. She reports having an issue with her daughter-in-law, "she has my [employer] card and won't give it back, but I'm afraid if I do anything she won't let me see the grandchildren anymore." LCSW validated these feelings and encouraged Laura [name] to have an open conversation with her daughter-in-law, or perhaps her son. Laura [name] expressed understanding and agreement. Laura [name] expressed feeling excited that her landlord has agreed to sell them the house they're living in. She reports feeling excited for that opportunity. LCSW validated that idea, and recognized how exciting it can be to get a new living opportunity.   Suicidal/Homicidal: No  Therapist Response: Laura [name] continues to work towards her tx goals. We will continue to work on emotional regulation skills and distress tolerance moving forward.   Plan: Return again in  weeks.  Diagnosis: Axis I: MDD    Axis II: No diagnosis    Alden Hipp, LCSW 12/13/2018

## 2018-12-20 ENCOUNTER — Telehealth: Payer: Self-pay

## 2018-12-20 ENCOUNTER — Encounter: Payer: Self-pay | Admitting: Pain Medicine

## 2018-12-20 NOTE — Telephone Encounter (Signed)
Attempted to call patient to go over pre virtual visit appointment.  Left message to call us back.

## 2018-12-23 NOTE — Progress Notes (Signed)
Pain Management Virtual Encounter Note - Virtual Visit via Telephone Telehealth (real-time audio visits between healthcare provider and patient).   Patient's Phone No. & Preferred Pharmacy:  [phone] (home); [phone] (mobile); (Preferred) [phone] [email or web address]  [facility] [phone] [place], [place] - [address] [place] [address] Phone: [phone] Fax: [phone]    Pre-screening note:  Our staff contacted Ms. Schlageter and offered her an "in person", "face-to-face" appointment versus a telephone encounter. She indicated preferring the telephone encounter, at this time.   Reason for Virtual Visit: COVID-19*  Social distancing based on CDC and AMA recommendations.   I contacted Loriann Wessler on 12/24/2018 via telephone.      I clearly identified myself as Oswaldo Done, MD. I verified that I was speaking with the correct person using two identifiers (Name: Zharick Galdo, and date of birth: [birth year]).  Advanced Informed Consent I sought verbal advanced consent from [name] for virtual visit interactions. I informed Ms. Johannes of possible security and privacy concerns, risks, and limitations associated with providing "not-in-person" medical evaluation and management services. I also informed Ms. Revak of the availability of "in-person" appointments. Finally, I informed her that there would be a charge for the virtual visit and that she could be  personally, fully or partially, financially responsible for it. Ms. Greenlief expressed understanding and agreed to proceed.   Historic Elements   Ms. Annai Doehring is a 73 y.o. year old, female patient evaluated today after her last encounter by our practice on 12/20/2018. Ms. Brinegar  has a past medical history of Allergy, Anxiety, Arthritis, degenerative (10/08/2013), Chronic kidney disease, Depression, Lumbar spinal stenosis with neurogenic claudication (11/07/2014), Major  depression, single episode, in complete remission (HCC) (06/25/2015), Memory loss, short term (03/19/2014), Seizure (HCC) (10/07/2014), Sleep apnea, Sleep apnea, and Thyroid disease. She also  has a past surgical history that includes Abdominal hysterectomy; Cesarean section; Replacement total knee (Left); and Hip surgery (Right). Ms. Wootan has a current medication list which includes the following prescription(s): amlodipine, aspirin ec, chlorthalidone, vitamin d, docusate sodium, duloxetine, folic acid, hydroxychloroquine, infliximab, levothyroxine, levothyroxine, victoza, loratadine, methotrexate, metoprolol succinate, multi-vitamins, oxycodone, quetiapine, quetiapine, simvastatin, spironolactone, valacyclovir, cefdinir, hydroxychloroquine, metoprolol succinate, metoprolol succinate, [START ON 01/14/2019] oxycodone, [START ON 02/13/2019] oxycodone, and [START ON 03/15/2019] oxycodone. She  reports that she has never smoked. She has never used smokeless tobacco. She reports that she does not drink alcohol or use drugs. Ms. Goos is allergic to bupropion and penicillins.   HPI  Today, she is being contacted for medication management.  The patient indicates no longer needing or taking the Amitiza.  The patient indicates doing well with the current medication regimen. No adverse reactions or side effects reported to the medications.   Pharmacotherapy Assessment  Analgesic: Oxycodone IR 5 mg every 6 hours (20 mg/day) MME/day:30 mg/day.   Monitoring: Pharmacotherapy: No side-effects or adverse reactions reported. [place] PMP: PDMP reviewed during this encounter.       Compliance: No problems identified. Effectiveness: Clinically acceptable. Plan: Refer to "POC".  UDS:  Summary  Date Value Ref Range Status  03/14/2018 FINAL  Final    Comment:    ==================================================================== TOXASSURE SELECT 13  (MW) ==================================================================== Test                             Result       Flag       Units Drug Present and Declared for Prescription  Verification   Noroxycodone                   745          EXPECTED   ng/mg creat    Noroxycodone is an expected metabolite of oxycodone. Sources of    oxycodone include scheduled prescription medications. Drug Present not Declared for Prescription Verification   Tramadol                       4129         UNEXPECTED ng/mg creat   O-Desmethyltramadol            >5000        UNEXPECTED ng/mg creat   N-Desmethyltramadol            >5000        UNEXPECTED ng/mg creat    Source of tramadol is a prescription medication.    O-desmethyltramadol and N-desmethyltramadol are expected    metabolites of tramadol. Drug Absent but Declared for Prescription Verification   Oxycodone                      Not Detected UNEXPECTED ng/mg creat    Oxycodone is almost always present in patients taking this drug    consistently.  Absence of oxycodone could be due to lapse of time    since the last dose or unusual pharmacokinetics (rapid    metabolism). ==================================================================== Test                      Result    Flag   Units      Ref Range   Creatinine              100              mg/dL      >=20 ==================================================================== Declared Medications:  The flagging and interpretation on this report are based on the  following declared medications.  Unexpected results may arise from  inaccuracies in the declared medications.  **Note: The testing scope of this panel includes these medications:  Oxycodone  **Note: The testing scope of this panel does not include following  reported medications:  Quetiapine  Simvastatin  Spironolactone ==================================================================== For clinical consultation, please call [phone]. ====================================================================    Laboratory Chemistry Profile (12 mo)  Renal: 10/09/2018: BUN 24; Creatinine, Ser 1.02  Lab Results  Component Value Date   GFRAA >60 10/09/2018   GFRNONAA 54 (L) 10/09/2018   Hepatic: 10/06/2018: Albumin 4.0 Lab Results  Component Value Date   AST 39 10/06/2018   ALT 24 10/06/2018   Other: 10/08/2018: Vitamin B-12 1,442 Note: Above Lab results reviewed.  Imaging  ECHOCARDIOGRAM COMPLETE   ECHOCARDIOGRAM REPORT       Patient Name:   SABLE KNOLES Date of Exam: 10/09/2018 Medical Rec #:  [id number]         Height:       64.0 in Accession #:    [id number]        Weight:       298.9 lb Date of Birth:  1945-05-15         BSA:          2.32 m Patient Age:    73 years          BP:           157/58 mmHg Patient Gender: F  HR:           88 bpm. Exam Location:  [facility]  Procedure: 2D Echo, Cardiac Doppler and Color Doppler  Indications:     Atrial Fibrillation 427.31   History:         Patient has prior history of Echocardiogram examinations, most                  recent 07/31/2017. CKD, anxiety.   Sonographer:     Cristela BlueJerry Hege RDCS (AE) Referring Phys:  [id number] Orpha BurKATY DODD MAYO Diagnosing Phys: Harold HedgeKenneth Fath MD  IMPRESSIONS   1. Left ventricular ejection fraction, by visual estimation, is 55 to 60%. The left ventricle has normal function. There is no left ventricular hypertrophy.  2. Global right ventricle has normal systolic function.The right ventricular size is mildly enlarged. No increase in right ventricular wall thickness.  3. Left atrial size was mildly dilated.  4. Right atrial size was mildly dilated.  5. The mitral valve was not well visualized. Trace mitral valve regurgitation.  6. The tricuspid valve is not well visualized. Tricuspid valve regurgitation is trivial.  7. The aortic valve was not well visualized Aortic valve regurgitation was not visualized by color flow  Doppler.  8. The pulmonic valve was not well visualized. Pulmonic valve regurgitation is not visualized by color flow Doppler.  9. The aortic root was not well visualized. 10. Moderately elevated pulmonary artery systolic pressure. 11. The interatrial septum was not assessed.  FINDINGS  Left Ventricle: Left ventricular ejection fraction, by visual estimation, is 55 to 60%. The left ventricle has normal function. There is no left ventricular hypertrophy.  Right Ventricle: The right ventricular size is mildly enlarged. No increase in right ventricular wall thickness. Global RV systolic function is has normal systolic function. The tricuspid regurgitant velocity is 3.11 m/s, and with an assumed right atrial  pressure of 10 mmHg, the estimated right ventricular systolic pressure is moderately elevated at 48.7 mmHg.  Left Atrium: Left atrial size was mildly dilated.  Right Atrium: Right atrial size was mildly dilated  Pericardium: There is no evidence of pericardial effusion.  Mitral Valve: The mitral valve was not well visualized. Trace mitral valve regurgitation.  Tricuspid Valve: The tricuspid valve is not well visualized. Tricuspid valve regurgitation is trivial by color flow Doppler.  Aortic Valve: The aortic valve was not well visualized. Aortic valve regurgitation was not visualized by color flow Doppler. Aortic valve mean gradient measures 2.5 mmHg. Aortic valve peak gradient measures 5.2 mmHg. Aortic valve area, by VTI measures  2.93 cm.  Pulmonic Valve: The pulmonic valve was not well visualized. Pulmonic valve regurgitation is not visualized by color flow Doppler.  Aorta: The aortic root was not well visualized.  IAS/Shunts: The interatrial septum was not assessed.    LEFT VENTRICLE PLAX 2D LVIDd:         4.46 cm  Diastology LVIDs:         2.85 cm  LV e' lateral:   7.51 cm/s LV PW:         1.01 cm  LV E/e' lateral: 17.8 LV IVS:        1.39 cm  LV e' medial:    5.55  cm/s LVOT diam:     2.10 cm  LV E/e' medial:  24.1 LV SV:         60 ml LV SV Index:   23.31 LVOT Area:     3.46 cm    RIGHT VENTRICLE RV  Basal diam:  4.13 cm RV S prime:     16.10 cm/s TAPSE (M-mode): 4.6 cm  LEFT ATRIUM             Index       RIGHT ATRIUM           Index LA diam:        4.60 cm 1.98 cm/m  RA Area:     14.80 cm LA Vol (A2C):   73.1 ml 31.50 ml/m RA Volume:   34.00 ml  14.65 ml/m LA Vol (A4C):   84.4 ml 36.37 ml/m LA Biplane Vol: 79.0 ml 34.04 ml/m  AORTIC VALVE                   PULMONIC VALVE AV Area (Vmax):    2.66 cm    PV Vmax:        0.69 m/s AV Area (Vmean):   2.49 cm    PV Peak grad:   1.9 mmHg AV Area (VTI):     2.93 cm    RVOT Peak grad: 2 mmHg AV Vmax:           113.50 cm/s AV Vmean:          72.650 cm/s AV VTI:            0.246 m AV Peak Grad:      5.2 mmHg AV Mean Grad:      2.5 mmHg LVOT Vmax:         87.20 cm/s LVOT Vmean:        52.200 cm/s LVOT VTI:          0.208 m LVOT/AV VTI ratio: 0.85   AORTA Ao Root diam: 3.30 cm  MITRAL VALVE                         TRICUSPID VALVE MV Area (PHT): 3.42 cm              TR Peak grad:   38.7 mmHg MV PHT:        64.38 msec            TR Vmax:        311.00 cm/s MV Decel Time: 222 msec MV E velocity: 134.00 cm/s 103 cm/s  SHUNTS MV A velocity: 106.00 cm/s 70.3 cm/s Systemic VTI:  0.21 m MV E/A ratio:  1.26        1.5       Systemic Diam: 2.10 cm    Harold HedgeKenneth Fath MD Electronically signed by Harold HedgeKenneth Fath MD Signature Date/Time: 10/09/2018/12:27:39 PM      Final     Assessment  Diagnoses of Chronic pain syndrome and Opioid-induced constipation (OIC) were pertinent to this visit.  Plan of Care  Problem-specific:  No problem-specific Assessment & Plan notes found for this encounter.  I am having Lanora ManisElizabeth Bowens start on oxyCODONE and oxyCODONE. I am also having her maintain her loratadine, Vitamin D, spironolactone, levothyroxine, hydroxychloroquine, folic acid, Multi-Vitamins,  aspirin EC, chlorthalidone, methotrexate, inFLIXimab, amLODipine, levothyroxine, QUEtiapine, DULoxetine, metoprolol succinate, cefdinir, oxyCODONE, Victoza, valACYclovir, hydroxychloroquine, metoprolol succinate, metoprolol succinate, docusate sodium, simvastatin, QUEtiapine, and oxyCODONE.  Pharmacotherapy (Medications Ordered): Meds ordered this encounter  Medications  . oxyCODONE (OXY IR/ROXICODONE) 5 MG immediate release tablet    Sig: Take 1 tablet (5 mg total) by mouth every 6 (six) hours as needed for severe pain. Must last 30 days    Dispense:  120 tablet  Refill:  0    Chronic Pain: STOP Act (Not applicable) Fill 1 day early if closed on refill date. Do not fill until: 01/14/2019. To last until: 02/13/2019. Avoid benzodiazepines within 8 hours of opioids  . oxyCODONE (OXY IR/ROXICODONE) 5 MG immediate release tablet    Sig: Take 1 tablet (5 mg total) by mouth every 6 (six) hours as needed for severe pain. Must last 30 days    Dispense:  120 tablet    Refill:  0    Chronic Pain: STOP Act (Not applicable) Fill 1 day early if closed on refill date. Do not fill until: 02/13/2019. To last until: 03/15/2019. Avoid benzodiazepines within 8 hours of opioids  . oxyCODONE (OXY IR/ROXICODONE) 5 MG immediate release tablet    Sig: Take 1 tablet (5 mg total) by mouth every 6 (six) hours as needed for severe pain. Must last 30 days    Dispense:  120 tablet    Refill:  0    Chronic Pain: STOP Act (Not applicable) Fill 1 day early if closed on refill date. Do not fill until: 03/15/2019. To last until: 04/14/2019. Avoid benzodiazepines within 8 hours of opioids   Orders:  No orders of the defined types were placed in this encounter.  Follow-up plan:   Return in about 4 months (around 04/10/2019) for (VV), (MM).      Considering:   Therapeutic right-sided intra-articular Hyalgan knee injections  Diagnostic intra-articular left hip joint injection  Possible left hip RFA  Diagnostic right intra-articular  knee joint injection with local anesthetic and steroid  Diagnostic bilateral genicular nerve block  Possible bilateral genicular nerve RFA  Diagnostic bilateral lumbar facet block  Possible bilateral lumbar facet RFA  Diagnostic bilateral L3-4 and/or L5-S1 transforaminal ESI  Diagnostic L3-4 versus L4-5 LESI    Palliative PRN treatment(s):   Therapeutic right-sided intra-articular Hyalgan knee injections Palliative left intra-articular hip joint injection  Diagnostic left lumbar facet block #2        Recent Visits Date Type Provider Dept  10/01/18 Office Visit Delano Metz, MD [facility]  Showing recent visits within past 90 days and meeting all other requirements   Today's Visits Date Type Provider Dept  12/24/18 Telemedicine Delano Metz, MD [facility]  Showing today's visits and meeting all other requirements   Future Appointments No visits were found meeting these conditions.  Showing future appointments within next 90 days and meeting all other requirements   I discussed the assessment and treatment plan with the patient. The patient was provided an opportunity to ask questions and all were answered. The patient agreed with the plan and demonstrated an understanding of the instructions.  Patient advised to call back or seek an in-person evaluation if the symptoms or condition worsens.  Total duration of non-face-to-face encounter: 13 minutes.  Note by: Oswaldo Done, MD Date: 12/24/2018; Time: 12:37 PM  Note: This dictation was prepared with Dragon dictation. Any transcriptional errors that may result from this process are unintentional.  Disclaimer:  * Given the special circumstances of the COVID-19 pandemic, the federal government has announced that the Office for Civil Rights (OCR) will exercise its enforcement discretion and will not impose penalties on physicians using telehealth in the event of noncompliance with regulatory  requirements under the [employer] Portability and Accountability Act (HIPAA) in connection with the good faith provision of telehealth during the COVID-19 national public health emergency. (AMA)

## 2018-12-24 ENCOUNTER — Ambulatory Visit: Payer: Medicare Other | Attending: Pain Medicine | Admitting: Pain Medicine

## 2018-12-24 ENCOUNTER — Other Ambulatory Visit: Payer: Self-pay

## 2018-12-24 DIAGNOSIS — M5442 Lumbago with sciatica, left side: Secondary | ICD-10-CM

## 2018-12-24 DIAGNOSIS — M47816 Spondylosis without myelopathy or radiculopathy, lumbar region: Secondary | ICD-10-CM

## 2018-12-24 DIAGNOSIS — Z79891 Long term (current) use of opiate analgesic: Secondary | ICD-10-CM

## 2018-12-24 DIAGNOSIS — G894 Chronic pain syndrome: Secondary | ICD-10-CM | POA: Diagnosis not present

## 2018-12-24 DIAGNOSIS — K5903 Drug induced constipation: Secondary | ICD-10-CM

## 2018-12-24 DIAGNOSIS — G8929 Other chronic pain: Secondary | ICD-10-CM

## 2018-12-24 DIAGNOSIS — T402X5S Adverse effect of other opioids, sequela: Secondary | ICD-10-CM

## 2018-12-24 DIAGNOSIS — Z7982 Long term (current) use of aspirin: Secondary | ICD-10-CM

## 2018-12-24 DIAGNOSIS — M25561 Pain in right knee: Secondary | ICD-10-CM

## 2018-12-24 DIAGNOSIS — Z79899 Other long term (current) drug therapy: Secondary | ICD-10-CM

## 2018-12-24 MED ORDER — OXYCODONE HCL 5 MG PO TABS: 5.0000 mg | ORAL_TABLET | Freq: Four times a day (QID) | ORAL | 0 refills | Status: DC | PRN

## 2019-01-14 ENCOUNTER — Other Ambulatory Visit: Payer: Self-pay

## 2019-01-14 ENCOUNTER — Ambulatory Visit (INDEPENDENT_AMBULATORY_CARE_PROVIDER_SITE_OTHER): Payer: Medicare Other | Admitting: Licensed Clinical Social Worker

## 2019-01-14 ENCOUNTER — Encounter: Payer: Self-pay | Admitting: Licensed Clinical Social Worker

## 2019-01-14 DIAGNOSIS — F3342 Major depressive disorder, recurrent, in full remission: Secondary | ICD-10-CM

## 2019-01-14 NOTE — Progress Notes (Signed)
   THERAPIST PROGRESS NOTE  Session Time: 1440  Participation Level: Active  Behavioral Response: CasualAlertAnxious  Type of Therapy: Individual Therapy  Treatment Goals addressed: Coping  Interventions: Supportive  Summary: Laura [name] is a 74 y.o. female who presents with continued symptoms related to her diagnosis. Laura [name] reports doing well since our last session. She  Reports the holidays went well, and they were able to see family members, which Laura [name] noted was very nice. She reported her daughter in law, with whom she has not always gotten along, got her and her husband presents this year, which was the first time that's happened. Laura [name] validated Laura [name] feelings around this, and held space for her to discuss her feelings around her daughter in law. Laura [name] went on to discuss her other family members, and how she keeps in touch with them. Laura [name] highlighted how much effort it takes to maintain relationships in that way, and pointed out how thoughtful Britaney is. Shaianne expressed understanding and agreement. Chairty noted she did not have any issues she wished to discuss further today, but wanted to check in again in about a month.   Suicidal/Homicidal: No  Therapist Response: Laura [name] continues to work towards her tx goals but has not yet reached them. We will continue to work on improving emotional regulation skills moving forward.   Plan: Return again in 4 weeks.  Diagnosis: Axis I: MDD    Axis II: No diagnosis    Laura [name], Laura [name] 01/14/2019

## 2019-02-04 ENCOUNTER — Other Ambulatory Visit: Payer: Self-pay | Admitting: Internal Medicine

## 2019-02-04 DIAGNOSIS — Z1231 Encounter for screening mammogram for malignant neoplasm of breast: Secondary | ICD-10-CM

## 2019-02-14 ENCOUNTER — Encounter: Payer: Self-pay | Admitting: Psychiatry

## 2019-02-14 ENCOUNTER — Ambulatory Visit (INDEPENDENT_AMBULATORY_CARE_PROVIDER_SITE_OTHER): Payer: Medicare Other | Admitting: Psychiatry

## 2019-02-14 ENCOUNTER — Other Ambulatory Visit: Payer: Self-pay

## 2019-02-14 DIAGNOSIS — F5101 Primary insomnia: Secondary | ICD-10-CM

## 2019-02-14 DIAGNOSIS — F3342 Major depressive disorder, recurrent, in full remission: Secondary | ICD-10-CM | POA: Diagnosis not present

## 2019-02-14 MED ORDER — DULOXETINE HCL 60 MG PO CPEP: 60.0000 mg | ORAL_CAPSULE | Freq: Two times a day (BID) | ORAL | 1 refills | Status: DC

## 2019-02-14 MED ORDER — QUETIAPINE FUMARATE 300 MG PO TABS: ORAL_TABLET | ORAL | 1 refills | Status: DC

## 2019-02-14 NOTE — Progress Notes (Signed)
Provider Location : [facility] Patient Location : Home   Virtual Visit via Telephone Note  I connected with Laura [name] on 02/14/19 at  4:40 PM EST by telephone and verified that I am speaking with the correct person using two identifiers.   I discussed the limitations, risks, security and privacy concerns of performing an evaluation and management service by telephone and the availability of in person appointments. I also discussed with the patient that there may be a patient responsible charge related to this service. The patient expressed understanding and agreed to proceed.     I discussed the assessment and treatment plan with the patient. The patient was provided an opportunity to ask questions and all were answered. The patient agreed with the plan and demonstrated an understanding of the instructions.   The patient was advised to call back or seek an in-person evaluation if the symptoms worsen or if the condition fails to improve as anticipated.   BH MD OP Progress Note  02/14/2019 4:21 PM Makylee Sanborn  MRN:  [id number]  Chief Complaint:  Chief Complaint    Follow-up     HPI: Laura [name] is a 74 year old Caucasian female who has a history of MDD, primary insomnia, chronic pain was evaluated by phone today.  Patient preferred to do a phone call.  Patient today reports she is currently doing well on the current medication regimen.  She denies any significant anxiety or depressive symptoms.  She reports sleep continues to be good.  She continues to be compliant on medications as prescribed.  She denies any side effects.  Patient reports she is going to get her labs drawn at her primary care office.  She agrees to fax it to [place].  Patient denies any suicidality, homicidality, appeared to be very pleasant during our conversation.  She appeared to be alert, oriented and was able to answer questions appropriately. Visit Diagnosis:    ICD-10-CM   1. MDD (major depressive  disorder), recurrent, in full remission (HCC)  F33.42 QUEtiapine (SEROQUEL) 300 MG tablet    DULoxetine (CYMBALTA) 60 MG capsule  2. Primary insomnia  F51.01     Past Psychiatric History: Reviewed past psychiatric history from my progress note on 06/13/2018.  Past Medical History:  Past Medical History:  Diagnosis Date  . Allergy   . Anxiety   . Arthritis, degenerative 10/08/2013   Overview:    a.  Lumbar spine/spinal stenosis/foot drop.   b.  Hands.   . Chronic kidney disease   . Depression   . 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 Major depression, single episode, in complete remission (HCC) 06/25/2015  . Memory loss, short term 03/19/2014  . Seizure (HCC) 10/07/2014  . Sleep apnea   . Sleep apnea   . Thyroid disease     Past Surgical History:  Procedure Laterality Date  . ABDOMINAL HYSTERECTOMY    . CESAREAN SECTION    . HIP SURGERY Right   . REPLACEMENT TOTAL KNEE Left     Family Psychiatric History: Reviewed family psychiatric history from my progress note on 06/13/2018.  Family History:  Family History  Problem Relation Age of Onset  . Hypertension Mother   . Stroke Mother   . Heart attack Father   . Alcohol abuse Father   . Depression Father   . Post-traumatic stress disorder Father   . Rheum arthritis Sister   . Hypertension Sister   . Depression Sister   . Breast cancer Neg Hx  Social History: Reviewed social history from my progress note on 06/13/2018. Social History   Socioeconomic History  . Marital status: Married    Spouse name: Not on file  . Number of children: Not on file  . Years of education: Not on file  . Highest education level: Not on file  Occupational History    Comment: retired  Tobacco Use  . Smoking status: Never Smoker  . Smokeless tobacco: Never Used  Substance and Sexual Activity  . Alcohol use: No    Alcohol/week: 0.0 standard drinks  . Drug use: No  . Sexual activity: Not Currently  Other Topics  Concern  . Not on file  Social History Narrative  . Not on file   Social Determinants of Health   Financial Resource Strain:   . Difficulty of Paying Living Expenses: Not on file  Food Insecurity:   . Worried About Charity fundraiser in the Last Year: Not on file  . Ran Out of Food in the Last Year: Not on file  Transportation Needs:   . Lack of Transportation (Medical): Not on file  . Lack of Transportation (Non-Medical): Not on file  Physical Activity:   . Days of Exercise per Week: Not on file  . Minutes of Exercise per Session: Not on file  Stress:   . Feeling of Stress : Not on file  Social Connections:   . Frequency of Communication with Friends and Family: Not on file  . Frequency of Social Gatherings with Friends and Family: Not on file  . Attends Religious Services: Not on file  . Active Member of Clubs or Organizations: Not on file  . Attends Archivist Meetings: Not on file  . Marital Status: Not on file    Allergies:  Allergies  Allergen Reactions  . Bupropion   . Penicillins Rash    Metabolic Disorder Labs: No results found for: HGBA1C, MPG No results found for: PROLACTIN No results found for: CHOL, TRIG, HDL, CHOLHDL, VLDL, LDLCALC Lab Results  Component Value Date   TSH 0.383 10/07/2018    Therapeutic Level Labs: No results found for: LITHIUM No results found for: VALPROATE No components found for:  CBMZ  Current Medications: Current Outpatient Medications  Medication Sig Dispense Refill  . amLODipine (NORVASC) 10 MG tablet TAKE 0.5 TABLETS BY MOUTH ONCE NIGHTLY    . aspirin EC 81 MG tablet Take by mouth.    . cefdinir (OMNICEF) 300 MG capsule Take 1 capsule (300 mg total) by mouth 2 (two) times daily. (Patient not taking: Reported on 11/22/2018) 5 capsule 0  . chlorthalidone (HYGROTON) 25 MG tablet TAKE 1/2 TABLET (12.5MG ) BY MOUTH EVERY DAY  11  . Cholecalciferol (VITAMIN D) 2000 UNITS tablet Take by mouth daily.     Marland Kitchen docusate  sodium (COLACE) 100 MG capsule Take 100 mg by mouth 2 (two) times daily.    . DULoxetine (CYMBALTA) 60 MG capsule Take 1 capsule (60 mg total) by mouth 2 (two) times daily. 725 capsule 1  . folic acid (FOLVITE) 1 MG tablet Take 1 mg by mouth daily.    . hydroxychloroquine (PLAQUENIL) 200 MG tablet Take 100 mg by mouth daily.     . hydroxychloroquine (PLAQUENIL) 200 MG tablet Take by mouth.    . inFLIXimab (REMICADE) 100 MG injection Inject 100 mg into the vein every 8 (eight) weeks.    Marland Kitchen levothyroxine (SYNTHROID) 50 MCG tablet Take 50 mcg by mouth at bedtime.     Marland Kitchen  levothyroxine (SYNTHROID, LEVOTHROID) 200 MCG tablet TAKE 1 TABLET ONCE DAILY- ON AN EMPTY STOMACH WITH A GLASS OF WATER 30-60 MINUTES BEFORE BREAKFAST  5  . liraglutide (VICTOZA) 18 MG/3ML SOPN Inject into the skin.    Marland Kitchen loratadine (CLARITIN) 10 MG tablet Take 10 mg by mouth daily.     . methotrexate (RHEUMATREX) 2.5 MG tablet TAKE 4 TABLETS (10 MG TOTAL) BY MOUTH EVERY 7 (SEVEN) DAYS WITH A MEAL  5  . metoprolol succinate (TOPROL-XL) 100 MG 24 hr tablet Take 1 tablet (100 mg total) by mouth daily. Take with or immediately following a meal. 30 tablet 0  . metoprolol succinate (TOPROL-XL) 100 MG 24 hr tablet Take by mouth.    . metoprolol succinate (TOPROL-XL) 50 MG 24 hr tablet Take 50 mg by mouth daily.    . Multiple Vitamin (MULTI-VITAMINS) TABS Take by mouth.    . oxyCODONE (OXY IR/ROXICODONE) 5 MG immediate release tablet Take 1 tablet (5 mg total) by mouth every 6 (six) hours as needed for moderate pain or severe pain. 20 tablet 0  . oxyCODONE (OXY IR/ROXICODONE) 5 MG immediate release tablet Take 1 tablet (5 mg total) by mouth every 6 (six) hours as needed for severe pain. Must last 30 days 120 tablet 0  . oxyCODONE (OXY IR/ROXICODONE) 5 MG immediate release tablet Take 1 tablet (5 mg total) by mouth every 6 (six) hours as needed for severe pain. Must last 30 days 120 tablet 0  . [START ON 03/15/2019] oxyCODONE (OXY IR/ROXICODONE) 5  MG immediate release tablet Take 1 tablet (5 mg total) by mouth every 6 (six) hours as needed for severe pain. Must last 30 days 120 tablet 0  . QUEtiapine (SEROQUEL XR) 300 MG 24 hr tablet     . QUEtiapine (SEROQUEL) 300 MG tablet TAKE 1 TABLET BY MOUTH EVERYDAY AT BEDTIME 90 tablet 1  . rosuvastatin (CRESTOR) 5 MG tablet Take 5 mg by mouth 2 (two) times a week.    . simvastatin (ZOCOR) 10 MG tablet Take 10 mg by mouth daily.    Marland Kitchen spironolactone (ALDACTONE) 25 MG tablet TAKE 1/2 (ONE-HALF) TABLET BY MOUTH DAILY  11  . valACYclovir (VALTREX) 1000 MG tablet PLEASE SEE ATTACHED FOR DETAILED DIRECTIONS     No current facility-administered medications for this visit.     Musculoskeletal: Strength & Muscle Tone: UTA Gait & Station: UTA Patient leans: N/A  Psychiatric Specialty Exam: Review of Systems  Psychiatric/Behavioral: Negative for agitation, behavioral problems, confusion, decreased concentration, dysphoric mood, hallucinations, self-injury, sleep disturbance and suicidal ideas. The patient is not nervous/anxious and is not hyperactive.   All other systems reviewed and are negative.   There were no vitals taken for this visit.There is no height or weight on file to calculate BMI.  General Appearance: UTA  Eye Contact:  UTA  Speech:  Clear and Coherent  Volume:  Normal  Mood:  Euthymic  Affect:  Congruent  Thought Process:  Goal Directed and Descriptions of Associations: Intact  Orientation:  Full (Time, Place, and Person)  Thought Content: Logical   Suicidal Thoughts:  No  Homicidal Thoughts:  No  Memory:  Immediate;   Fair Recent;   Fair Remote;   Fair  Judgement:  Fair  Insight:  Fair  Psychomotor Activity:  UTA  Concentration:  Concentration: Fair and Attention Span: Fair  Recall:  [employer] of Knowledge: Fair  Language: Fair  Akathisia:  No  Handed:  Right  AIMS (if indicated): UTA  Assets:  Communication Skills Desire for Improvement Housing Social Support   ADL's:  Intact  Cognition: WNL  Sleep:  Fair   Screenings: PHQ2-9     Office Visit from 03/14/2018 in [facility] Office Visit from 06/19/2017 in [facility] Office Visit from 05/29/2017 in [facility] Office Visit from 04[facility]18/2017 in [facility], [facility] Clinical Support from 04/13/2017 in [facility]  PHQ-2 Total Score  0  0  2  0  0       Assessment and Plan: Vonnie is a 74 year old Caucasian female, married, retired, lives in [place], has a history of depression, currently in remission, primary insomnia, hypertension, congestive heart failure, GERD, diabetes melitis, hypothyroidism, rheumatoid arthritis, chronic pain, history of knee replacement was evaluated by phone today.  Patient continues to remain stable on her current medication regimen.  Plan as noted below.  Plan MDD-stable Cymbalta 120 mg p.o. daily divided dosage Seroquel 300 mg p.o. nightly Patient did not tolerate taper off of Seroquel previously.   Insomnia-stable Seroquel as prescribed  Follow-up in clinic in 3 to 4 months or sooner if needed.  May 27 at 4:40 PM  I have spent atleast 20 minutes non face to face with patient today. More than 50 % of the time was spent for ordering medications and test ,psychoeducation and supportive psychotherapy and care coordination,as well as documenting clinical information in electronic health record. This note was generated in part or whole with voice recognition software. Voice recognition is usually quite accurate but there are transcription errors that can and very often do occur. I apologize for any typographical errors that were not detected and corrected.       Jomarie Longs, MD 02/14/2019, 4:21 PM

## 2019-02-18 ENCOUNTER — Ambulatory Visit (INDEPENDENT_AMBULATORY_CARE_PROVIDER_SITE_OTHER): Payer: Medicare Other | Admitting: Licensed Clinical Social Worker

## 2019-02-18 ENCOUNTER — Other Ambulatory Visit: Payer: Self-pay

## 2019-02-18 ENCOUNTER — Encounter: Payer: Self-pay | Admitting: Licensed Clinical Social Worker

## 2019-02-18 DIAGNOSIS — F3342 Major depressive disorder, recurrent, in full remission: Secondary | ICD-10-CM

## 2019-02-18 NOTE — Progress Notes (Signed)
Virtual Visit via Telephone Note  I connected with Patirica Longshore on 02/18/19 at  2:30 PM EST by telephone and verified that I am speaking with the correct person using two identifiers.   I discussed the limitations, risks, security and privacy concerns of performing an evaluation and management service by telephone and the availability of in person appointments. I also discussed with the patient that there may be a patient responsible charge related to this service. The patient expressed understanding and agreed to proceed.  I discussed the assessment and treatment plan with the patient. The patient was provided an opportunity to ask questions and all were answered. The patient agreed with the plan and demonstrated an understanding of the instructions.   The patient was advised to call back or seek an in-person evaluation if the symptoms worsen or if the condition fails to improve as anticipated.  I provided 30 minutes of non-face-to-face time during this encounter.   Heidi Dach, LCSW    THERAPIST PROGRESS NOTE  Session Time: 1430  Participation Level: Active  Behavioral Response: CasualAlertAnxious  Type of Therapy: Individual Therapy  Treatment Goals addressed: Coping  Interventions: Supportive  Summary: Patrece Tallie is a 74 y.o. female who presents with continued symptoms related to her diagnosis. Nekisha reports doing well since our last session. Farhana reports she is scheduled to get her COVID-19 vaccination, which she is feeling a little nervous about. LCSW validated Ralene's feelings, and held space for her to discuss her worries. LCSW was able to provide information on the vaccine to put Alexiya's mind at ease, and also encouraged her to ask the provider administering the shot any questions she has at the time. Sameeha expressed understanding and agreement. Yolander went on to discuss her husband and his hoarding. She reports they are coming to do an  inspection on their home before they purchase it, and she is worried about his hoarding. LCSW validated Vianka's feelings, and encouraged her to utilize thought challenging techniques to manage her anxiety around the situation. Finally, Roslyn discussed her in-laws, and discussed how her son's wife's parents do not seem to like her, but stated she is trying not to worry about it. LCSW validated Shamya's feelings and recognized how difficult it can be to navigate those relationships. LCSW held space for [facility] to vent her frustrations around that situation as well.  Suicidal/Homicidal: No   Therapist Response: Felecia continues to work towards her tx goals but has not yet reached them. We will continue to work on improving emotional regulation and distress tolerance skills moving forward.  Plan: Return again in 4 weeks.  Diagnosis: Axis I: MDD    Axis II: No diagnosis    Heidi Dach, LCSW 02/18/2019

## 2019-03-18 ENCOUNTER — Other Ambulatory Visit: Payer: Self-pay

## 2019-03-18 ENCOUNTER — Encounter: Payer: Self-pay | Admitting: Licensed Clinical Social Worker

## 2019-03-18 ENCOUNTER — Ambulatory Visit (INDEPENDENT_AMBULATORY_CARE_PROVIDER_SITE_OTHER): Payer: Medicare Other | Admitting: Licensed Clinical Social Worker

## 2019-03-18 DIAGNOSIS — F3342 Major depressive disorder, recurrent, in full remission: Secondary | ICD-10-CM

## 2019-03-18 DIAGNOSIS — F3341 Major depressive disorder, recurrent, in partial remission: Secondary | ICD-10-CM | POA: Diagnosis not present

## 2019-03-18 NOTE — Progress Notes (Signed)
Virtual Visit via Video Note  I connected with Amaura Authier on 03/18/19 at  2:30 PM EST by a video enabled telemedicine application and verified that I am speaking with the correct person using two identifiers.   I discussed the limitations of evaluation and management by telemedicine and the availability of in person appointments. The patient expressed understanding and agreed to proceed.  I discussed the assessment and treatment plan with the patient. The patient was provided an opportunity to ask questions and all were answered. The patient agreed with the plan and demonstrated an understanding of the instructions.   The patient was advised to call back or seek an in-person evaluation if the symptoms worsen or if the condition fails to improve as anticipated.  I provided 30 minutes of non-face-to-face time during this encounter.   Heidi Dach, LCSW   THERAPIST PROGRESS NOTE  Session Time: 1430  Participation Level: Active  Behavioral Response: CasualAlertAnxious  Type of Therapy: Individual Therapy  Treatment Goals addressed: Anxiety  Interventions: Supportive  Summary: Laura [name] is a 74 y.o. female who presents with continued symptoms related to her diagnosis. Yasuko reports doing well since our last session. She reports her primary stressor has been one of her good friends being diagnosed with Dementia. She reports finding this very upsetting, and worrying about her constantly. She also noted that it made her worry about her own brain. LCSW validated Yarethzi's feelings and held space for her to discuss events associated with these worries. LCSW offered insight where appropriate. LCSW encouraged Mackynzie to challenge negative thoughts when she can, and to be supportive to her friend in the ways she is able to--checking in via call, card, etc. Lanora Manis expressed understanding and agreement. Kaitlyne reported her family members are doing well, and she was able to  see her grandchildren over the [facility] she noted always makes her happy. She reports her sleep has been okay as long as she remembers to take her medication on time. LCSW validated this idea and encouraged Susie to set a timer on her phone in order to remember to take her medication. Norah expressed understanding and agreement.   Suicidal/Homicidal: No  Therapist Response: Kamarri continues to work towards her tx goals but has not yet reached them. We will continue to work on improving emotional regulation skills and distress tolerance skills moving forward.   Plan: Return again in 4 weeks.  Diagnosis: Axis I: MDD    Axis II: No diagnosis    Heidi Dach, LCSW 03/18/2019

## 2019-04-09 ENCOUNTER — Telehealth: Payer: Self-pay | Admitting: *Deleted

## 2019-04-09 NOTE — Progress Notes (Signed)
Patient: Laura [name]  Service Category: E/M  Provider: Gaspar Cola, MD  DOB: Apr 23, 1945  DOS: 04/10/2019  Location: Office  MRN: [id number]  Setting: Ambulatory outpatient  Referring Provider: Adin Hector, MD  Type: Established Patient  Specialty: Interventional Pain Management  PCP: Laura Hector, MD  Location: Remote location  Delivery: TeleHealth     Virtual Encounter - Pain Management PROVIDER NOTE: Information contained herein reflects review and annotations entered in association with encounter. Interpretation of such information and data should be left to medically-trained personnel. Information provided to patient can be located elsewhere in the medical record under "Patient Instructions". Document created using STT-dictation technology, any transcriptional errors that may result from process are unintentional.    Contact & Pharmacy Preferred: [phone] Home: [phone] (home) Mobile: [phone] (mobile) E-mail: [email or web address]  [id number] [id number]: [phone]: [phone]  Pre-screening  Laura [name] offered "in-person" vs "virtual" encounter. She indicated preferring virtual for this encounter.   Reason COVID-19*  Social distancing based on CDC and AMA recommendations.   I contacted Laura [name] 04/10/2019 via telephone.      I clearly identified myself as FGaspar Cola MD. I verified that I was speaking with the correct person using two identifiers (Name: Laura [name] and date of birth: 812-20-1947.  Consent I sought verbal advanced consent from EMorene Rankinsfor virtual visit interactions. I informed Laura [name] of possible security and privacy concerns, risks, and limitations associated with providing "not-in-person" medical evaluation and management services. I also informed Laura [name] of the availability of "in-person" appointments.  Finally, I informed her that there would be a charge for the virtual visit and that she could be  personally, fully or partially, financially responsible for it. Ms. ARichesexpressed understanding and agreed to proceed.   Historic Elements   Ms. Laura Lindersis a 74y.o. year old, female patient evaluated today after her last contact with our practice on 04/09/2019. Laura [name]  has a past medical history of Allergy, Anxiety, Arthritis, degenerative (10/08/2013), Chronic kidney disease, Depression, Lumbar spinal stenosis with neurogenic claudication (11/07/2014), Major depression, single episode, in complete remission ([place] (06/25/2015), Memory loss, short term (03/19/2014), Seizure ([place] (10/07/2014), Sleep apnea, Sleep apnea, and Thyroid disease. She also  has a past surgical history that includes Abdominal hysterectomy; Cesarean section; Replacement total knee (Left); and Hip surgery (Right). Ms. ADreesehas a current medication list which includes the following prescription(s): [START ON 04/14/2019] oxycodone, [START ON 05/14/2019] oxycodone, [START ON 06/13/2019] oxycodone, amlodipine, aspirin ec, cefdinir, chlorthalidone, vitamin d, docusate sodium, duloxetine, folic acid, hydroxychloroquine, hydroxychloroquine, infliximab, levothyroxine, levothyroxine, victoza, loratadine, methotrexate, metoprolol succinate, metoprolol succinate, metoprolol succinate, multi-vitamins, quetiapine, rosuvastatin, simvastatin, spironolactone, and valacyclovir. She  reports that she has never smoked. She has never used smokeless tobacco. She reports that she does not drink alcohol or use drugs. Ms. AStoreyis [name] to bupropion and penicillins.   HPI  Today, she is being contacted for medication management. The patient indicates doing well with the current medication regimen. No adverse reactions or side effects reported to the medications.  The patient indicates having received her second Covid shot  yesterday.  Pharmacotherapy Assessment  Analgesic: Oxycodone IR 5 mg every 6 hours (20 mg/day) MME/day:30 mg/day.   Monitoring: Laura PMP: PDMP reviewed during this encounter.       Pharmacotherapy: No side-effects or adverse reactions reported. Compliance: No problems identified. Effectiveness:  Clinically acceptable. Plan: Refer to "POC".  UDS:  Summary  Date Value Ref Range Status  03/14/2018 FINAL  Final    Comment:    ==================================================================== TOXASSURE SELECT 13 (MW) ==================================================================== Test                             Result       Flag       Units Drug Present and Declared for Prescription Verification   Noroxycodone                   745          EXPECTED   ng/mg creat    Noroxycodone is an expected metabolite of oxycodone. Sources of    oxycodone include scheduled prescription medications. Drug Present not Declared for Prescription Verification   Tramadol                       4129         UNEXPECTED ng/mg creat   O-Desmethyltramadol            >5000        UNEXPECTED ng/mg creat   N-Desmethyltramadol            >5000        UNEXPECTED ng/mg creat    Source of tramadol is a prescription medication.    O-desmethyltramadol and N-desmethyltramadol are expected    metabolites of tramadol. Drug Absent but Declared for Prescription Verification   Oxycodone                      Not Detected UNEXPECTED ng/mg creat    Oxycodone is almost always present in patients taking this drug    consistently.  Absence of oxycodone could be due to lapse of time    since the last dose or unusual pharmacokinetics (rapid    metabolism). ==================================================================== Test                      Result    Flag   Units      Ref Range   Creatinine              100              mg/dL      >=20 ==================================================================== Declared  Medications:  The flagging and interpretation on this report are based on the  following declared medications.  Unexpected results may arise from  inaccuracies in the declared medications.  **Note: The testing scope of this panel includes these medications:  Oxycodone  **Note: The testing scope of this panel does not include following  reported medications:  Quetiapine  Simvastatin  Spironolactone ==================================================================== For clinical consultation, please call [phone]. ====================================================================    Laboratory Chemistry Profile   Renal Lab Results  Component Value Date   BUN 24 (H) 10/09/2018   CREATININE 1.02 (H) 10/09/2018   GFRAA >60 10/09/2018   GFRNONAA 54 (L) 10/09/2018     Hepatic Lab Results  Component Value Date   AST 39 10/06/2018   ALT 24 10/06/2018   ALBUMIN 4.0 10/06/2018   ALKPHOS 70 10/06/2018     Electrolytes Lab Results  Component Value Date   NA 138 10/09/2018   K 4.0 10/09/2018   CL 98 10/09/2018   CALCIUM 8.7 (L) 10/09/2018   MG 2.3 10/08/2018     Bone Lab  Results  Component Value Date   25OHVITD1 36 07/15/2015   25OHVITD2 3.6 07/15/2015   25OHVITD3 32 07/15/2015     Inflammation (CRP: Acute Phase) (ESR: Chronic Phase) Lab Results  Component Value Date   CRP 1.9 (H) 07/15/2015   ESRSEDRATE 32 (H) 07/15/2015   LATICACIDVEN 1.4 10/07/2018       Note: Above Lab results reviewed.  Imaging  ECHOCARDIOGRAM COMPLETE   ECHOCARDIOGRAM REPORT       Patient Name:   KESTREL MIS Date of Exam: 10/09/2018 Medical Rec #:  [id number]         Height:       64.0 in Accession #:    [id number]        Weight:       298.9 lb Date of Birth:  Aug 03, 1945         BSA:          2.32 m Patient Age:    89 years          BP:           157/58 mmHg Patient Gender: F                 HR:           88 bpm. Exam Location:  [facility]  Procedure: 2D Echo, Cardiac Doppler  and Color Doppler  Indications:     Atrial Fibrillation 427.31   History:         Patient has prior history of Echocardiogram examinations, most                  recent 07/31/2017. CKD, anxiety.   Sonographer:     Sherrie Sport RDCS (AE) Referring Phys:  [id number] Valetta Fuller DODD MAYO Diagnosing Phys: Bartholome Bill MD  IMPRESSIONS   1. Left ventricular ejection fraction, by visual estimation, is 55 to 60%. The left ventricle has normal function. There is no left ventricular hypertrophy.  2. Global right ventricle has normal systolic function.The right ventricular size is mildly enlarged. No increase in right ventricular wall thickness.  3. Left atrial size was mildly dilated.  4. Right atrial size was mildly dilated.  5. The mitral valve was not well visualized. Trace mitral valve regurgitation.  6. The tricuspid valve is not well visualized. Tricuspid valve regurgitation is trivial.  7. The aortic valve was not well visualized Aortic valve regurgitation was not visualized by color flow Doppler.  8. The pulmonic valve was not well visualized. Pulmonic valve regurgitation is not visualized by color flow Doppler.  9. The aortic root was not well visualized. 10. Moderately elevated pulmonary artery systolic pressure. 11. The interatrial septum was not assessed.  FINDINGS  Left Ventricle: Left ventricular ejection fraction, by visual estimation, is 55 to 60%. The left ventricle has normal function. There is no left ventricular hypertrophy.  Right Ventricle: The right ventricular size is mildly enlarged. No increase in right ventricular wall thickness. Global RV systolic function is has normal systolic function. The tricuspid regurgitant velocity is 3.11 m/s, and with an assumed right atrial  pressure of 10 mmHg, the estimated right ventricular systolic pressure is moderately elevated at 48.7 mmHg.  Left Atrium: Left atrial size was mildly dilated.  Right Atrium: Right atrial size was mildly  dilated  Pericardium: There is no evidence of pericardial effusion.  Mitral Valve: The mitral valve was not well visualized. Trace mitral valve regurgitation.  Tricuspid Valve: The tricuspid valve is not well visualized. Tricuspid valve regurgitation is  trivial by color flow Doppler.  Aortic Valve: The aortic valve was not well visualized. Aortic valve regurgitation was not visualized by color flow Doppler. Aortic valve mean gradient measures 2.5 mmHg. Aortic valve peak gradient measures 5.2 mmHg. Aortic valve area, by VTI measures  2.93 cm.  Pulmonic Valve: The pulmonic valve was not well visualized. Pulmonic valve regurgitation is not visualized by color flow Doppler.  Aorta: The aortic root was not well visualized.  IAS/Shunts: The interatrial septum was not assessed.    LEFT VENTRICLE PLAX 2D LVIDd:         4.46 cm  Diastology LVIDs:         2.85 cm  LV e' lateral:   7.51 cm/s LV PW:         1.01 cm  LV E/e' lateral: 17.8 LV IVS:        1.39 cm  LV e' medial:    5.55 cm/s LVOT diam:     2.10 cm  LV E/e' medial:  24.1 LV SV:         60 ml LV SV Index:   23.31 LVOT Area:     3.46 cm    RIGHT VENTRICLE RV Basal diam:  4.13 cm RV S prime:     16.10 cm/s TAPSE (M-mode): 4.6 cm  LEFT ATRIUM             Index       RIGHT ATRIUM           Index LA diam:        4.60 cm 1.98 cm/m  RA Area:     14.80 cm LA Vol (A2C):   73.1 ml 31.50 ml/m RA Volume:   34.00 ml  14.65 ml/m LA Vol (A4C):   84.4 ml 36.37 ml/m LA Biplane Vol: 79.0 ml 34.04 ml/m  AORTIC VALVE                   PULMONIC VALVE AV Area (Vmax):    2.66 cm    PV Vmax:        0.69 m/s AV Area (Vmean):   2.49 cm    PV Peak grad:   1.9 mmHg AV Area (VTI):     2.93 cm    RVOT Peak grad: 2 mmHg AV Vmax:           113.50 cm/s AV Vmean:          72.650 cm/s AV VTI:            0.246 m AV Peak Grad:      5.2 mmHg AV Mean Grad:      2.5 mmHg LVOT Vmax:         87.20 cm/s LVOT Vmean:        52.200 cm/s LVOT VTI:           0.208 m LVOT/AV VTI ratio: 0.85   AORTA Ao Root diam: 3.30 cm  MITRAL VALVE                         TRICUSPID VALVE MV Area (PHT): 3.42 cm              TR Peak grad:   38.7 mmHg MV PHT:        64.38 msec            TR Vmax:        311.00 cm/s MV Decel Time: 222 msec MV E  velocity: 134.00 cm/s 103 cm/s  SHUNTS MV A velocity: 106.00 cm/s 70.3 cm/s Systemic VTI:  0.21 m MV E/A ratio:  1.26        1.5       Systemic Diam: 2.10 cm    Bartholome Bill MD Electronically signed by Bartholome Bill MD Signature Date/Time: 10/09/2018/12:27:39 PM      Final    Assessment  The primary encounter diagnosis was Chronic pain syndrome. Diagnoses of Chronic low back pain (Primary Area of Pain) (Bilateral) (L>R), Spinal stenosis of lumbar region, unspecified whether neurogenic claudication present, Lumbar foraminal stenosis (Bilateral L3-4 and L5-S1), Lumbar facet syndrome (Bilateral) (L>R), and Seronegative rheumatoid arthritis ([place]) were also pertinent to this visit.  Plan of Care  Problem-specific:  No problem-specific Assessment & Plan notes found for this encounter.  Laura [name] has a current medication list which includes the following long-term medication(s): [START ON 04/14/2019] oxycodone, [START ON 05/14/2019] oxycodone, [START ON 06/13/2019] oxycodone, chlorthalidone, duloxetine, infliximab, levothyroxine, levothyroxine, victoza, loratadine, metoprolol succinate, metoprolol succinate, metoprolol succinate, quetiapine, and spironolactone.  Pharmacotherapy (Medications Ordered): Meds ordered this encounter  Medications  . oxyCODONE (OXY IR/ROXICODONE) 5 MG immediate release tablet    Sig: Take 1 tablet (5 mg total) by mouth every 6 (six) hours as needed for severe pain. Must last 30 days    Dispense:  120 tablet    Refill:  0    Chronic Pain: STOP Act (Not applicable) Fill 1 day early if closed on refill date. Do not fill until: 04/14/2019. To last until: 05/14/2019. Avoid benzodiazepines  within 8 hours of opioids  . oxyCODONE (OXY IR/ROXICODONE) 5 MG immediate release tablet    Sig: Take 1 tablet (5 mg total) by mouth every 6 (six) hours as needed for severe pain. Must last 30 days    Dispense:  120 tablet    Refill:  0    Chronic Pain: STOP Act (Not applicable) Fill 1 day early if closed on refill date. Do not fill until: 05/14/2019. To last until: 06/13/2019. Avoid benzodiazepines within 8 hours of opioids  . oxyCODONE (OXY IR/ROXICODONE) 5 MG immediate release tablet    Sig: Take 1 tablet (5 mg total) by mouth every 6 (six) hours as needed for severe pain. Must last 30 days    Dispense:  120 tablet    Refill:  0    Chronic Pain: STOP Act (Not applicable) Fill 1 day early if closed on refill date. Do not fill until: 06/13/2019. To last until: 07/13/2019. Avoid benzodiazepines within 8 hours of opioids   Orders:  No orders of the defined types were placed in this encounter.  Follow-up plan:   Return in about 3 months (around 07/10/2019) for (VV), (MM).      Considering:   Therapeutic right-sided intra-articular Hyalgan knee injections  Diagnostic intra-articular left hip joint injection  Possible left hip RFA  Diagnostic right intra-articular knee joint injection with local anesthetic and steroid  Diagnostic bilateral genicular nerve block  Possible bilateral genicular nerve RFA  Diagnostic bilateral lumbar facet block  Possible bilateral lumbar facet RFA  Diagnostic bilateral L3-4 and/or L5-S1 transforaminal ESI  Diagnostic L3-4 versus L4-5 LESI    Palliative PRN treatment(s):   Therapeutic right-sided intra-articular Hyalgan knee injections Palliative left intra-articular hip joint injection  Diagnostic left lumbar facet block #2         Recent Visits No visits were found meeting these conditions.  Showing recent visits within past 90 days and meeting all  other requirements   Today's Visits Date Type Provider Dept  04/10/19 Telemedicine Milinda Pointer, MD  [facility]  Showing today's visits and meeting all other requirements   Future Appointments No visits were found meeting these conditions.  Showing future appointments within next 90 days and meeting all other requirements   I discussed the assessment and treatment plan with the patient. The patient was provided an opportunity to ask questions and all were answered. The patient agreed with the plan and demonstrated an understanding of the instructions.  Patient advised to call back or seek an in-person evaluation if the symptoms or condition worsens.  Duration of encounter: 12 minutes.  Note by: Laura Cola, MD Date: 04/10/2019; Time: 10:15 AM

## 2019-04-09 NOTE — Telephone Encounter (Signed)
Attempted to call for pre appointment review of allergies/meds. Message left at both numbers. 

## 2019-04-10 ENCOUNTER — Other Ambulatory Visit: Payer: Self-pay

## 2019-04-10 ENCOUNTER — Telehealth: Payer: Self-pay

## 2019-04-10 ENCOUNTER — Encounter: Payer: Self-pay | Admitting: Pain Medicine

## 2019-04-10 ENCOUNTER — Ambulatory Visit: Payer: Medicare Other | Attending: Pain Medicine | Admitting: Pain Medicine

## 2019-04-10 DIAGNOSIS — M5442 Lumbago with sciatica, left side: Secondary | ICD-10-CM | POA: Diagnosis not present

## 2019-04-10 DIAGNOSIS — G894 Chronic pain syndrome: Secondary | ICD-10-CM

## 2019-04-10 DIAGNOSIS — M48061 Spinal stenosis, lumbar region without neurogenic claudication: Secondary | ICD-10-CM

## 2019-04-10 DIAGNOSIS — G8929 Other chronic pain: Secondary | ICD-10-CM

## 2019-04-10 DIAGNOSIS — M0600 Rheumatoid arthritis without rheumatoid factor, unspecified site: Secondary | ICD-10-CM

## 2019-04-10 DIAGNOSIS — M47816 Spondylosis without myelopathy or radiculopathy, lumbar region: Secondary | ICD-10-CM | POA: Diagnosis not present

## 2019-04-10 DIAGNOSIS — M5441 Lumbago with sciatica, right side: Secondary | ICD-10-CM

## 2019-04-10 MED ORDER — OXYCODONE HCL 5 MG PO TABS: 5.0000 mg | ORAL_TABLET | Freq: Four times a day (QID) | ORAL | 0 refills | Status: DC | PRN

## 2019-04-24 ENCOUNTER — Other Ambulatory Visit: Payer: Self-pay

## 2019-04-24 ENCOUNTER — Ambulatory Visit: Payer: Medicare Other | Admitting: Licensed Clinical Social Worker

## 2019-04-26 ENCOUNTER — Telehealth: Payer: Self-pay

## 2019-04-26 NOTE — Telephone Encounter (Signed)
Confirmed appointment on 04/29/2019 and screened for covid. kl[facility] 

## 2019-04-30 ENCOUNTER — Other Ambulatory Visit: Payer: Self-pay

## 2019-04-30 ENCOUNTER — Encounter: Payer: Self-pay | Admitting: Internal Medicine

## 2019-04-30 ENCOUNTER — Ambulatory Visit (INDEPENDENT_AMBULATORY_CARE_PROVIDER_SITE_OTHER): Payer: Medicare Other | Admitting: Internal Medicine

## 2019-04-30 VITALS — BP 146/78 | HR 88 | Temp 97.0°F | Resp 16 | Ht 64.0 in | Wt 270.0 lb

## 2019-04-30 DIAGNOSIS — J4520 Mild intermittent asthma, uncomplicated: Secondary | ICD-10-CM | POA: Diagnosis not present

## 2019-04-30 DIAGNOSIS — G4733 Obstructive sleep apnea (adult) (pediatric): Secondary | ICD-10-CM

## 2019-04-30 DIAGNOSIS — R0602 Shortness of breath: Secondary | ICD-10-CM

## 2019-04-30 DIAGNOSIS — K219 Gastro-esophageal reflux disease without esophagitis: Secondary | ICD-10-CM

## 2019-04-30 DIAGNOSIS — N289 Disorder of kidney and ureter, unspecified: Secondary | ICD-10-CM

## 2019-04-30 DIAGNOSIS — Z9989 Dependence on other enabling machines and devices: Secondary | ICD-10-CM

## 2019-04-30 MED ORDER — ALBUTEROL SULFATE HFA 108 (90 BASE) MCG/ACT IN AERS: 2.0000 | INHALATION_SPRAY | Freq: Four times a day (QID) | RESPIRATORY_TRACT | 3 refills | Status: DC | PRN

## 2019-04-30 NOTE — Progress Notes (Signed)
[facility] [address] [place], [place] [address]  Pulmonary Sleep Medicine   Office Visit Note  Patient Name: Laura [name] DOB: 01-25-1945 MRN [id number]  Date of Service: 04/30/2019  Complaints/HPI: Pt is here for follow up on osa, asthma and gerd. She reports her asthma is well controlled. Pt reports good compliance with CPAP therapy. Cleaning machine by hand, and changing filters and tubing as directed. Denies headaches, sinus issues, palpitations, or hemoptysis.     ROS  General: (-) fever, (-) chills, (-) night sweats, (-) weakness Skin: (-) rashes, (-) itching,. Eyes: (-) visual changes, (-) redness, (-) itching. Nose and Sinuses: (-) nasal stuffiness or itchiness, (-) postnasal drip, (-) nosebleeds, (-) sinus trouble. Mouth and Throat: (-) sore throat, (-) hoarseness. Neck: (-) swollen glands, (-) enlarged thyroid, (-) neck pain. Respiratory: - cough, (-) bloody sputum, - shortness of breath, - wheezing. Cardiovascular: - ankle swelling, (-) chest pain. Lymphatic: (-) lymph node enlargement. Neurologic: (-) numbness, (-) tingling. Psychiatric: (-) anxiety, (-) depression   Current Medication: Outpatient Encounter Medications as of 04/30/2019  Medication Sig Note  . albuterol (VENTOLIN HFA) 108 (90 Base) MCG/ACT inhaler Inhale into the lungs.   Marland Kitchen amLODipine (NORVASC) 10 MG tablet TAKE 0.5 TABLETS BY MOUTH ONCE NIGHTLY   . aspirin EC 81 MG tablet Take by mouth.   Marland Kitchen azelastine (ASTELIN) 0.1 % nasal spray Place into the nose.   . cefdinir (OMNICEF) 300 MG capsule Take 1 capsule (300 mg total) by mouth 2 (two) times daily.   . chlorthalidone (HYGROTON) 25 MG tablet TAKE 1/2 TABLET (12.5MG ) BY MOUTH EVERY DAY   . Cholecalciferol (VITAMIN D) 2000 UNITS tablet Take by mouth daily.    Marland Kitchen docusate sodium (COLACE) 100 MG capsule Take 100 mg by mouth 2 (two) times daily.   . DULoxetine (CYMBALTA) 60 MG capsule Take 1 capsule (60 mg total) by mouth 2 (two) times  daily.   . folic acid (FOLVITE) 1 MG tablet Take 1 mg by mouth daily.   . hydroxychloroquine (PLAQUENIL) 200 MG tablet Take 100 mg by mouth daily.    . hydroxychloroquine (PLAQUENIL) 200 MG tablet Take 200 mg by mouth.    . inFLIXimab (REMICADE) 100 MG injection Inject 100 mg into the vein every 8 (eight) weeks.   Marland Kitchen levothyroxine (SYNTHROID) 50 MCG tablet Take 50 mcg by mouth at bedtime.    Marland Kitchen levothyroxine (SYNTHROID, LEVOTHROID) 200 MCG tablet TAKE 1 TABLET ONCE DAILY- ON AN EMPTY STOMACH WITH A GLASS OF WATER 30-60 MINUTES BEFORE BREAKFAST   . liraglutide (VICTOZA) 18 MG/3ML SOPN Inject into the skin.   Marland Kitchen loratadine (CLARITIN) 10 MG tablet Take 10 mg by mouth daily.    . methotrexate (RHEUMATREX) 2.5 MG tablet TAKE 4 TABLETS (10 MG TOTAL) BY MOUTH EVERY 7 (SEVEN) DAYS WITH A MEAL 10/06/2018: Sundays   . metoprolol succinate (TOPROL-XL) 100 MG 24 hr tablet Take 1 tablet (100 mg total) by mouth daily. Take with or immediately following a meal.   . metoprolol succinate (TOPROL-XL) 100 MG 24 hr tablet Take by mouth.   . metoprolol succinate (TOPROL-XL) 50 MG 24 hr tablet Take 50 mg by mouth daily. 11/22/2018: Taking differently  . Multiple Vitamin (MULTI-VITAMINS) TABS Take by mouth.   . oxyCODONE (OXY IR/ROXICODONE) 5 MG immediate release tablet Take 1 tablet (5 mg total) by mouth every 6 (six) hours as needed for severe pain. Must last 30 days 04/10/2019: FUTURE Prescription. (NOT a DUPLICATE!!) >>>DO NOT DELETE<<< (even  if Expired!) See Care Coordination Note from [facility] (Dr. Laban Emperor)  . [START ON 05/14/2019] oxyCODONE (OXY IR/ROXICODONE) 5 MG immediate release tablet Take 1 tablet (5 mg total) by mouth every 6 (six) hours as needed for severe pain. Must last 30 days 04/10/2019: FUTURE Prescription. (NOT a DUPLICATE!!) >>>DO NOT DELETE<<< (even if Expired!) See Care Coordination Note from [facility] (Dr. Laban Emperor)  . [START ON 06/13/2019] oxyCODONE (OXY IR/ROXICODONE) 5 MG immediate  release tablet Take 1 tablet (5 mg total) by mouth every 6 (six) hours as needed for severe pain. Must last 30 days 04/10/2019: FUTURE Prescription. (NOT a DUPLICATE!!) >>>DO NOT DELETE<<< (even if Expired!) See Care Coordination Note from [facility] (Dr. Laban Emperor)  . QUEtiapine (SEROQUEL) 300 MG tablet TAKE 1 TABLET BY MOUTH EVERYDAY AT BEDTIME   . rosuvastatin (CRESTOR) 5 MG tablet Take 5 mg by mouth 2 (two) times a week.   . simvastatin (ZOCOR) 10 MG tablet Take 10 mg by mouth daily.   Marland Kitchen spironolactone (ALDACTONE) 25 MG tablet TAKE 1/2 (ONE-HALF) TABLET BY MOUTH DAILY   . valACYclovir (VALTREX) 1000 MG tablet PLEASE SEE ATTACHED FOR DETAILED DIRECTIONS    No facility-administered encounter medications on file as of 04/30/2019.    Surgical History: Past Surgical History:  Procedure Laterality Date  . ABDOMINAL HYSTERECTOMY    . CESAREAN SECTION    . HIP SURGERY Right   . REPLACEMENT TOTAL KNEE Left     Medical History: Past Medical History:  Diagnosis Date  . Allergy   . Anxiety   . Arthritis, degenerative 10/08/2013   Overview:    a.  Lumbar spine/spinal stenosis/foot drop.   b.  Hands.   . Asthma   . Chronic kidney disease   . Depression   . 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 Major depression, single episode, in complete remission (HCC) 06/25/2015  . Memory loss, short term 03/19/2014  . Seizure (HCC) 10/07/2014  . Sleep apnea   . Sleep apnea   . Thyroid disease     Family History: Family History  Problem Relation Age of Onset  . Hypertension Mother   . Stroke Mother   . Heart attack Father   . Alcohol abuse Father   . Depression Father   . Post-traumatic stress disorder Father   . Rheum arthritis Sister   . Hypertension Sister   . Depression Sister   . Breast cancer Neg Hx     Social History: Social History   Socioeconomic History  . Marital status: Married    Spouse name: Not on file  . Number of children: Not on file   . Years of education: Not on file  . Highest education level: Not on file  Occupational History    Comment: retired  Tobacco Use  . Smoking status: Never Smoker  . Smokeless tobacco: Never Used  Substance and Sexual Activity  . Alcohol use: No    Alcohol/week: 0.0 standard drinks  . Drug use: No  . Sexual activity: Not Currently  Other Topics Concern  . Not on file  Social History Narrative  . Not on file   Social Determinants of Health   Financial Resource Strain:   . Difficulty of Paying Living Expenses:   Food Insecurity:   . Worried About Programme researcher, broadcasting/film/video in the Last Year:   . Barista in the Last Year:   Transportation Needs:   . Freight forwarder (Medical):   Marland Kitchen  Lack of Transportation (Non-Medical):   Physical Activity:   . Days of Exercise per Week:   . Minutes of Exercise per Session:   Stress:   . Feeling of Stress :   Social Connections:   . Frequency of Communication with Friends and Family:   . Frequency of Social Gatherings with Friends and Family:   . Attends Religious Services:   . Active Member of Clubs or Organizations:   . Attends Banker Meetings:   Marland Kitchen Marital Status:   Intimate Partner Violence:   . Fear of Current or Ex-Partner:   . Emotionally Abused:   Marland Kitchen Physically Abused:   . Sexually Abused:     Vital Signs: Blood pressure (!) 146/78, pulse 88, temperature (!) 97 F (36.1 C), resp. rate 16, height  (1.626 m), weight 270 lb (122.5 kg), SpO2 97 %.  Examination: General Appearance: The patient is well-developed, well-nourished, and in no distress. Skin: Gross inspection of skin unremarkable. Head: normocephalic, no gross deformities. Eyes: no gross deformities noted. ENT: ears appear grossly normal no exudates. Neck: Supple. No thyromegaly. No LAD. Respiratory: clear bilaterally. Cardiovascular: Normal S1 and S2 without murmur or rub. Extremities: No cyanosis. pulses are equal. Neurologic: Alert and  oriented. No involuntary movements.  LABS: No results found for this or any previous visit (from the past 2160 hour(s)).  Radiology: ECHOCARDIOGRAM COMPLETE  Result Date: 10/09/2018   ECHOCARDIOGRAM REPORT   Patient Name:   DURENE DODGE Date of Exam: 10/09/2018 Medical Rec #:  [id number]         Height:       64.0 in Accession #:    [id number]        Weight:       298.9 lb Date of Birth:  May 30, 1945         BSA:          2.32 m Patient Age:    73 years          BP:           157/58 mmHg Patient Gender: F                 HR:           88 bpm. Exam Location:  [facility] Procedure: 2D Echo, Cardiac Doppler and Color Doppler Indications:     Atrial Fibrillation 427.31  History:         Patient has prior history of Echocardiogram examinations, most                  recent 07/31/2017. CKD, anxiety.  Sonographer:     Cristela Blue RDCS (AE) Referring Phys:  [id number] Orpha Bur DODD MAYO Diagnosing Phys: Harold Hedge MD IMPRESSIONS  1. Left ventricular ejection fraction, by visual estimation, is 55 to 60%. The left ventricle has normal function. There is no left ventricular hypertrophy.  2. Global right ventricle has normal systolic function.The right ventricular size is mildly enlarged. No increase in right ventricular wall thickness.  3. Left atrial size was mildly dilated.  4. Right atrial size was mildly dilated.  5. The mitral valve was not well visualized. Trace mitral valve regurgitation.  6. The tricuspid valve is not well visualized. Tricuspid valve regurgitation is trivial.  7. The aortic valve was not well visualized Aortic valve regurgitation was not visualized by color flow Doppler.  8. The pulmonic valve was not well visualized. Pulmonic valve regurgitation is not visualized by color flow Doppler.  9. The  aortic root was not well visualized. 10. Moderately elevated pulmonary artery systolic pressure. 11. The interatrial septum was not assessed. FINDINGS  Left Ventricle: Left ventricular ejection fraction, by visual  estimation, is 55 to 60%. The left ventricle has normal function. There is no left ventricular hypertrophy. Right Ventricle: The right ventricular size is mildly enlarged. No increase in right ventricular wall thickness. Global RV systolic function is has normal systolic function. The tricuspid regurgitant velocity is 3.11 m/s, and with an assumed right atrial  pressure of 10 mmHg, the estimated right ventricular systolic pressure is moderately elevated at 48.7 mmHg. Left Atrium: Left atrial size was mildly dilated. Right Atrium: Right atrial size was mildly dilated Pericardium: There is no evidence of pericardial effusion. Mitral Valve: The mitral valve was not well visualized. Trace mitral valve regurgitation. Tricuspid Valve: The tricuspid valve is not well visualized. Tricuspid valve regurgitation is trivial by color flow Doppler. Aortic Valve: The aortic valve was not well visualized. Aortic valve regurgitation was not visualized by color flow Doppler. Aortic valve mean gradient measures 2.5 mmHg. Aortic valve peak gradient measures 5.2 mmHg. Aortic valve area, by VTI measures 2.93 cm. Pulmonic Valve: The pulmonic valve was not well visualized. Pulmonic valve regurgitation is not visualized by color flow Doppler. Aorta: The aortic root was not well visualized. IAS/Shunts: The interatrial septum was not assessed.  LEFT VENTRICLE PLAX 2D LVIDd:         4.46 cm  Diastology LVIDs:         2.85 cm  LV e' lateral:   7.51 cm/s LV PW:         1.01 cm  LV E/e' lateral: 17.8 LV IVS:        1.39 cm  LV e' medial:    5.55 cm/s LVOT diam:     2.10 cm  LV E/e' medial:  24.1 LV SV:         60 ml LV SV Index:   23.31 LVOT Area:     3.46 cm  RIGHT VENTRICLE RV Basal diam:  4.13 cm RV S prime:     16.10 cm/s TAPSE (M-mode): 4.6 cm LEFT ATRIUM             Index       RIGHT ATRIUM           Index LA diam:        4.60 cm 1.98 cm/m  RA Area:     14.80 cm LA Vol (A2C):   73.1 ml 31.50 ml/m RA Volume:   34.00 ml  14.65 ml/m LA  Vol (A4C):   84.4 ml 36.37 ml/m LA Biplane Vol: 79.0 ml 34.04 ml/m  AORTIC VALVE                   PULMONIC VALVE AV Area (Vmax):    2.66 cm    PV Vmax:        0.69 m/s AV Area (Vmean):   2.49 cm    PV Peak grad:   1.9 mmHg AV Area (VTI):     2.93 cm    RVOT Peak grad: 2 mmHg AV Vmax:           113.50 cm/s AV Vmean:          72.650 cm/s AV VTI:            0.246 m AV Peak Grad:      5.2 mmHg AV Mean Grad:      2.5  mmHg LVOT Vmax:         87.20 cm/s LVOT Vmean:        52.200 cm/s LVOT VTI:          0.208 m LVOT/AV VTI ratio: 0.85  AORTA Ao Root diam: 3.30 cm MITRAL VALVE                         TRICUSPID VALVE MV Area (PHT): 3.42 cm              TR Peak grad:   38.7 mmHg MV PHT:        64.38 msec            TR Vmax:        311.00 cm/s MV Decel Time: 222 msec MV E velocity: 134.00 cm/s 103 cm/s  SHUNTS MV A velocity: 106.00 cm/s 70.3 cm/s Systemic VTI:  0.21 m MV E/A ratio:  1.26        1.5       Systemic Diam: 2.10 cm  Harold Hedge MD Electronically signed by Harold Hedge MD Signature Date/Time: 10/09/2018/12:27:39 PM    Final     No results found.  No results found.    Assessment and Plan: Patient Active Problem List   Diagnosis Date Noted  . MDD (major depressive disorder), recurrent, in full remission (HCC) 11/22/2018  . Paroxysmal atrial fibrillation (HCC) 10/16/2018  . Coronary artery disease involving native coronary artery of native heart without angina pectoris 10/15/2018  . 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 Pneumonia 10/06/2018  . Immunosuppression (HCC) 09/18/2018  . Pharmacologic therapy 07/11/2018  . Disorder of skeletal system 07/11/2018  . Problems influencing health status 07/11/2018  . MDD (major depressive disorder), recurrent episode, mild (HCC) 07/09/2018  . Primary insomnia 07/09/2018  . Bereavement 07/09/2018  . Body mass index (BMI) of 40.0-44.9 in adult (HCC) 10/09/2017  . Postmenopausal 04/06/2017  . Chronic arthralgias of knees and  hips (Right) 08/18/2016  . Arthritis 07/11/2016  . Dyspnea on exertion 07/11/2016  . Kidney disease 07/11/2016  . 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 Snoring 07/11/2016  . Morbid obesity with BMI of 40.0-44.9, adult (HCC) 07/11/2016  . Opioid-induced constipation (OIC) 04/21/2016  . Osteoarthritis of knee (Right) 01/25/2016  . Diet-controlled diabetes mellitus (HCC) 10/03/2015  . GAD (generalized anxiety disorder) 09/24/2015  . Disturbance of skin sensation 07/15/2015  . Elevated sedimentation rate 07/15/2015  . Elevated C-reactive protein (CRP) 07/15/2015  . Chronic hip pain (Left) 04/06/2015  . History of TKR (total knee replacement) (Left) 02/09/2015  . History of femur fracture (Right) 02/09/2015  . 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 Lumbar foraminal stenosis (Bilateral L3-4 and L5-S1) 02/09/2015  . Chronic low back pain (Primary Area of Pain) (Bilateral) (L>R) 01/22/2015  . Chronic pain syndrome 11/25/2014  . Opioid dependence (HCC) 11/07/2014  . Lumbar spinal stenosis (5 mm Severe L3-4; 8 mm L4-5) 11/07/2014  . Lumbar spondylosis 11/07/2014  . Lumbar facet syndrome (Bilateral) (L>R) 11/07/2014  . Chronic knee pain (Right) 11/05/2014  . Opiate use (30 MME/Day) 11/05/2014  . Long term current use of opiate analgesic 11/05/2014  . Long term prescription opiate use 11/05/2014  . Encounter for therapeutic drug level monitoring 11/05/2014  . Morbid obesity (HCC) 10/27/2014  . Extreme obesity (HCC) 10/07/2014  . Type 2 diabetes mellitus (HCC) 03/19/2014  . Depressive disorder 10/14/2013  . Essential (  primary) hypertension 10/14/2013  . Insomnia secondary to chronic pain 07/09/2013  . Anxiety 07/09/2013  . GERD (gastroesophageal reflux disease) 01/18/2013  . Hypothyroidism 01/18/2013  . Hypercholesterolemia 01/18/2013  . 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 CKD (chronic kidney  disease) stage 3, GFR 30-59 ml/min (HCC) 01/17/2013  . Congestive heart failure with left ventricular systolic dysfunction (HCC) 11/10/2012    1. OSA on CPAP Continue to use cpap as directed.   2. SOB (shortness of breath) - Spirometry with Graph  3. Mild intermittent asthma without complication Will get PFt for eval. - Pulmonary Function Test; Future  4. Gastroesophageal reflux disease without esophagitis Stable, continue current meds.   5. Kidney disease Continue to follow up with nephrology.   General Counseling: I have discussed the findings of the evaluation and examination with Lanora Manis.  I have also discussed any further diagnostic evaluation thatmay be needed or ordered today. Mayar verbalizes understanding of the findings of todays visit. We also reviewed her medications today and discussed drug interactions and side effects including but not limited excessive drowsiness and altered mental states. We also discussed that there is always a risk not just to her but also people around her. she has been encouraged to call the office with any questions or concerns that should arise related to todays visit.  Orders Placed This Encounter  Procedures  . Spirometry with Graph    Order Specific Question:   Where should this test be performed?    Answer:   Other     Time spent: 30 This patient was seen by Blima Ledger AGNP-C in Collaboration with Dr. Freda Munro as a part of collaborative care agreement.   I have personally obtained a history, examined the patient, evaluated laboratory and imaging results, formulated the assessment and plan and placed orders.    Yevonne Pax, MD [facility] Pulmonary and Critical Care Sleep medicine

## 2019-05-20 ENCOUNTER — Telehealth: Payer: Self-pay

## 2019-05-20 NOTE — Telephone Encounter (Signed)
Called lmom informing patient of appointment on 05/22/2019. klh 

## 2019-05-22 ENCOUNTER — Ambulatory Visit (INDEPENDENT_AMBULATORY_CARE_PROVIDER_SITE_OTHER): Payer: Medicare Other | Admitting: Internal Medicine

## 2019-05-22 ENCOUNTER — Other Ambulatory Visit: Payer: Self-pay

## 2019-05-22 DIAGNOSIS — J4520 Mild intermittent asthma, uncomplicated: Secondary | ICD-10-CM | POA: Diagnosis not present

## 2019-05-22 LAB — PULMONARY FUNCTION TEST

## 2019-05-30 NOTE — Procedures (Signed)
[facility] [address] [place], [address]  DATE OF SERVICE: May 22, 2019  Complete Pulmonary Function Testing Interpretation:  FINDINGS:  Forced vital capacity is mildly decreased.  FEV1 is 1.58 L which is 74% of predicted and is mildly decreased.  FEV1 FVC ratio is normal.  Postbronchodilator there is no significant change in FEV1 clinical improvement may still occur in the absence of spirometric improvement.  Total capacity is moderately decreased residual volume is decreased residual in total lung capacity ratio is normal.  FRC is decreased.  DLCO is mildly decreased.  DLCO alveolar volume is normal.  IMPRESSION:  This pulmonary function study is consistent with moderate restrictive lung disease.  Patient exhibited a reduction in DLCO and there is no significant change with bronchodilators  Yevonne Pax, MD [facility] Pulmonary Critical Care Medicine Sleep Medicine

## 2019-06-06 ENCOUNTER — Telehealth (INDEPENDENT_AMBULATORY_CARE_PROVIDER_SITE_OTHER): Payer: Medicare Other | Admitting: Psychiatry

## 2019-06-06 ENCOUNTER — Other Ambulatory Visit: Payer: Self-pay

## 2019-06-06 ENCOUNTER — Encounter: Payer: Self-pay | Admitting: Psychiatry

## 2019-06-06 DIAGNOSIS — F5101 Primary insomnia: Secondary | ICD-10-CM

## 2019-06-06 DIAGNOSIS — F3342 Major depressive disorder, recurrent, in full remission: Secondary | ICD-10-CM

## 2019-06-06 MED ORDER — MIRTAZAPINE 7.5 MG PO TABS: 7.5000 mg | ORAL_TABLET | Freq: Every day | ORAL | 1 refills | Status: DC

## 2019-06-06 NOTE — Progress Notes (Signed)
Provider Location : [facility] Patient Location : Home  Virtual Visit via Telephone Note  I connected with Laura [name] on 06/06/19 at  4:40 PM EDT by telephone and verified that I am speaking with the correct person using two identifiers.   I discussed the limitations, risks, security and privacy concerns of performing an evaluation and management service by telephone and the availability of in person appointments. I also discussed with the patient that there may be a patient responsible charge related to this service. The patient expressed understanding and agreed to proceed.     I discussed the assessment and treatment plan with the patient. The patient was provided an opportunity to ask questions and all were answered. The patient agreed with the plan and demonstrated an understanding of the instructions.   The patient was advised to call back or seek an in-person evaluation if the symptoms worsen or if the condition fails to improve as anticipated. [facility] MD OP Progress Note  06/06/2019 5:29 PM Laura [name]  MRN:  [id number]  Chief Complaint:  Chief Complaint    Follow-up     HPI: Laura [name] is a 74 year old Caucasian female who has a history of MDD, primary insomnia, chronic pain was evaluated by phone today.  Patient preferred to do a phone call.  Patient today reports she is currently doing well with regards to her mood.  She is compliant on her medications as prescribed.  She however reports she is currently struggling with sleep.  The Seroquel used to help initially however it does not help anymore.  She wants to try something else for sleep.  She has never tried Remeron before.  She is on oxycodone and hence medication choices has to be done cautiously for her sleep.  This was discussed with her.  Patient denies any suicidality, homicidality or perceptual disturbances.  She appeared to be alert, oriented to person place time and situation.  Patient denies any other  concerns today.   Visit Diagnosis:    ICD-10-CM   1. MDD (major depressive disorder), recurrent, in full remission (HCC)  F33.42   2. Primary insomnia  F51.01 mirtazapine (REMERON) 7.5 MG tablet    Past Psychiatric History: I have reviewed past psychiatric history from my progress note on 06/13/2018  Past Medical History:  Past Medical History:  Diagnosis Date  . Allergy   . Anxiety   . Arthritis, degenerative 10/08/2013   Overview:    a.  Lumbar spine/spinal stenosis/foot drop.   b.  Hands.   . Asthma   . Chronic kidney disease   . Depression   . 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 Major depression, single episode, in complete remission (HCC) 06/25/2015  . Memory loss, short term 03/19/2014  . Seizure (HCC) 10/07/2014  . Sleep apnea   . Sleep apnea   . Thyroid disease     Past Surgical History:  Procedure Laterality Date  . ABDOMINAL HYSTERECTOMY    . CESAREAN SECTION    . HIP SURGERY Right   . REPLACEMENT TOTAL KNEE Left     Family Psychiatric History: I have reviewed family psychiatric history from my progress note on 06/13/2018  Family History:  Family History  Problem Relation Age of Onset  . Hypertension Mother   . Stroke Mother   . Heart attack Father   . Alcohol abuse Father   . Depression Father   . Post-traumatic stress disorder Father   . Rheum arthritis Sister   . Hypertension  Sister   . Depression Sister   . Breast cancer Neg Hx     Social History: I have reviewed social history from my progress note on 06/13/2018 Social History   Socioeconomic History  . Marital status: Married    Spouse name: Not on file  . Number of children: Not on file  . Years of education: Not on file  . Highest education level: Not on file  Occupational History    Comment: retired  Tobacco Use  . Smoking status: Never Smoker  . Smokeless tobacco: Never Used  Substance and Sexual Activity  . Alcohol use: No    Alcohol/week: 0.0 standard  drinks  . Drug use: No  . Sexual activity: Not Currently  Other Topics Concern  . Not on file  Social History Narrative  . Not on file   Social Determinants of Health   Financial Resource Strain:   . Difficulty of Paying Living Expenses:   Food Insecurity:   . Worried About Charity fundraiser in the Last Year:   . Arboriculturist in the Last Year:   Transportation Needs:   . Film/video editor (Medical):   Marland Kitchen Lack of Transportation (Non-Medical):   Physical Activity:   . Days of Exercise per Week:   . Minutes of Exercise per Session:   Stress:   . Feeling of Stress :   Social Connections:   . Frequency of Communication with Friends and Family:   . Frequency of Social Gatherings with Friends and Family:   . Attends Religious Services:   . Active Member of Clubs or Organizations:   . Attends Archivist Meetings:   Marland Kitchen Marital Status:     Allergies:  Allergies  Allergen Reactions  . Bupropion   . Penicillins Rash    Metabolic Disorder Labs: No results found for: HGBA1C, MPG No results found for: PROLACTIN No results found for: CHOL, TRIG, HDL, CHOLHDL, VLDL, LDLCALC Lab Results  Component Value Date   TSH 0.383 10/07/2018    Therapeutic Level Labs: No results found for: LITHIUM No results found for: VALPROATE No components found for:  CBMZ  Current Medications: Current Outpatient Medications  Medication Sig Dispense Refill  . albuterol (VENTOLIN HFA) 108 (90 Base) MCG/ACT inhaler Inhale 2 puffs into the lungs every 6 (six) hours as needed for wheezing or shortness of breath. 18 g 3  . amLODipine (NORVASC) 10 MG tablet TAKE 0.5 TABLETS BY MOUTH ONCE NIGHTLY    . aspirin EC 81 MG tablet Take by mouth.    Marland Kitchen azelastine (ASTELIN) 0.1 % nasal spray Place into the nose.    . cefdinir (OMNICEF) 300 MG capsule Take 1 capsule (300 mg total) by mouth 2 (two) times daily. 5 capsule 0  . chlorthalidone (HYGROTON) 25 MG tablet TAKE 1/2 TABLET (12.5MG ) BY MOUTH  EVERY DAY  11  . Cholecalciferol (VITAMIN D) 2000 UNITS tablet Take by mouth daily.     Marland Kitchen docusate sodium (COLACE) 100 MG capsule Take 100 mg by mouth 2 (two) times daily.    . DULoxetine (CYMBALTA) 60 MG capsule Take 1 capsule (60 mg total) by mouth 2 (two) times daily. 992 capsule 1  . folic acid (FOLVITE) 1 MG tablet Take 1 mg by mouth daily.    . hydroxychloroquine (PLAQUENIL) 200 MG tablet Take 100 mg by mouth daily.     . hydroxychloroquine (PLAQUENIL) 200 MG tablet Take 200 mg by mouth.     . inFLIXimab (REMICADE)  100 MG injection Inject 100 mg into the vein every 8 (eight) weeks.    Marland Kitchen levothyroxine (SYNTHROID) 50 MCG tablet Take 50 mcg by mouth at bedtime.     Marland Kitchen levothyroxine (SYNTHROID, LEVOTHROID) 200 MCG tablet TAKE 1 TABLET ONCE DAILY- ON AN EMPTY STOMACH WITH A GLASS OF WATER 30-60 MINUTES BEFORE BREAKFAST  5  . liraglutide (VICTOZA) 18 MG/3ML SOPN Inject into the skin.    Marland Kitchen loratadine (CLARITIN) 10 MG tablet Take 10 mg by mouth daily.     . methotrexate (RHEUMATREX) 2.5 MG tablet TAKE 4 TABLETS (10 MG TOTAL) BY MOUTH EVERY 7 (SEVEN) DAYS WITH A MEAL  5  . metoprolol succinate (TOPROL-XL) 100 MG 24 hr tablet Take 1 tablet (100 mg total) by mouth daily. Take with or immediately following a meal. 30 tablet 0  . metoprolol succinate (TOPROL-XL) 100 MG 24 hr tablet Take by mouth.    . metoprolol succinate (TOPROL-XL) 50 MG 24 hr tablet Take 50 mg by mouth daily.    . Multiple Vitamin (MULTI-VITAMINS) TABS Take by mouth.    . oxyCODONE (OXY IR/ROXICODONE) 5 MG immediate release tablet Take 1 tablet (5 mg total) by mouth every 6 (six) hours as needed for severe pain. Must last 30 days 120 tablet 0  . [START ON 06/13/2019] oxyCODONE (OXY IR/ROXICODONE) 5 MG immediate release tablet Take 1 tablet (5 mg total) by mouth every 6 (six) hours as needed for severe pain. Must last 30 days 120 tablet 0  . rosuvastatin (CRESTOR) 5 MG tablet Take 5 mg by mouth 2 (two) times a week.    . simvastatin  (ZOCOR) 10 MG tablet Take 10 mg by mouth daily.    Marland Kitchen spironolactone (ALDACTONE) 25 MG tablet TAKE 1/2 (ONE-HALF) TABLET BY MOUTH DAILY  11  . valACYclovir (VALTREX) 1000 MG tablet PLEASE SEE ATTACHED FOR DETAILED DIRECTIONS    . mirtazapine (REMERON) 7.5 MG tablet Take 1 tablet (7.5 mg total) by mouth at bedtime. For sleep 30 tablet 1  . oxyCODONE (OXY IR/ROXICODONE) 5 MG immediate release tablet Take 1 tablet (5 mg total) by mouth every 6 (six) hours as needed for severe pain. Must last 30 days 120 tablet 0   No current facility-administered medications for this visit.     Musculoskeletal: Strength & Muscle Tone: UTA Gait & Station: UTA Patient leans: N/A  Psychiatric Specialty Exam: Review of Systems  Psychiatric/Behavioral: Positive for sleep disturbance.  All other systems reviewed and are negative.   There were no vitals taken for this visit.There is no height or weight on file to calculate BMI.  General Appearance: UTA  Eye Contact:  UTA  Speech:  Clear and Coherent  Volume:  Normal  Mood:  Euthymic  Affect:  UTA  Thought Process:  Goal Directed and Descriptions of Associations: Intact  Orientation:  Full (Time, Place, and Person)  Thought Content: Logical   Suicidal Thoughts:  No  Homicidal Thoughts:  No  Memory:  Immediate;   Fair Recent;   Fair Remote;   Fair  Judgement:  Fair  Insight:  Fair  Psychomotor Activity:  UTA  Concentration:  Concentration: Fair and Attention Span: Fair  Recall:  [employer] of Knowledge: Fair  Language: Fair  Akathisia:  No  Handed:  Right  AIMS (if indicated): UTA  Assets:  Communication Skills Desire for Improvement Housing Social Support  ADL's:  Intact  Cognition: WNL  Sleep:  Fair   Screenings: [employer]     Office  Visit [facility] 03/14/2018 [facility] Office Visit from 06/19/2017 in [facility] Office Visit from 05/29/2017 in [facility], [facility] Office  Visit from 04/18/2017 in [facility], [facility] Clinical Support from 04/13/2017 in [facility]  PHQ-2 Total Score  0  0  2  0  0       Assessment and Plan: Howard Patton is a 74 year old Caucasian female, married, retired, lives in [place], has a history of depression, currently in remission, primary insomnia, hypertension, congestive heart failure, GERD, diabetes melitis, hypothyroidism, rheumatoid arthritis, chronic pain, history of knee replacement was evaluated by phone today.  Patient is currently struggling with sleep.  Plan as noted below.  Plan MDD-stable Cymbalta 120 mg p.o. daily in divided dosage   Insomnia-unstable Discontinue Seroquel. Start Remeron 7.5 mg p.o. nightly Discussed with patient drug to drug interaction between tramadol and oxycodone.  She will monitor.  Follow-up in clinic in 6 to 8 weeks or sooner if needed.  I have spent atleast 20 minutes non face to face with patient today. More than 50 % of the time was spent for preparing to see the patient ( e.g., review of test, records ), ordering medications and test ,psychoeducation and supportive psychotherapy and care coordination,as well as documenting clinical information in electronic health record.     Jomarie Longs, MD 06/06/2019, 5:29 PM

## 2019-06-12 ENCOUNTER — Telehealth: Payer: Self-pay

## 2019-06-12 NOTE — Telephone Encounter (Signed)
pt states she is not going to take the mirtazapine and she states that she has a seziure when she took klonopin.

## 2019-06-12 NOTE — Telephone Encounter (Signed)
Attempted to call patient, left voicemail.

## 2019-06-24 ENCOUNTER — Ambulatory Visit
Admission: RE | Admit: 2019-06-24 | Discharge: 2019-06-24 | Disposition: A | Discharge status: Home / Self Care | Payer: Medicare Other | Source: Ambulatory Visit | Attending: Internal Medicine | Admitting: Internal Medicine

## 2019-06-24 DIAGNOSIS — Z1231 Encounter for screening mammogram for malignant neoplasm of breast: Secondary | ICD-10-CM | POA: Insufficient documentation

## 2019-06-24 IMAGING — MG DIGITAL SCREENING BILAT W/ TOMO W/ CAD
6 of 12 series · 6 of 36 positions shown · non-contrast
Comparison: Previous exam(s).

CLINICAL DATA: Screening.

EXAM:
DIGITAL SCREENING BILATERAL MAMMOGRAM WITH TOMO AND CAD

[L CC synth-2D]
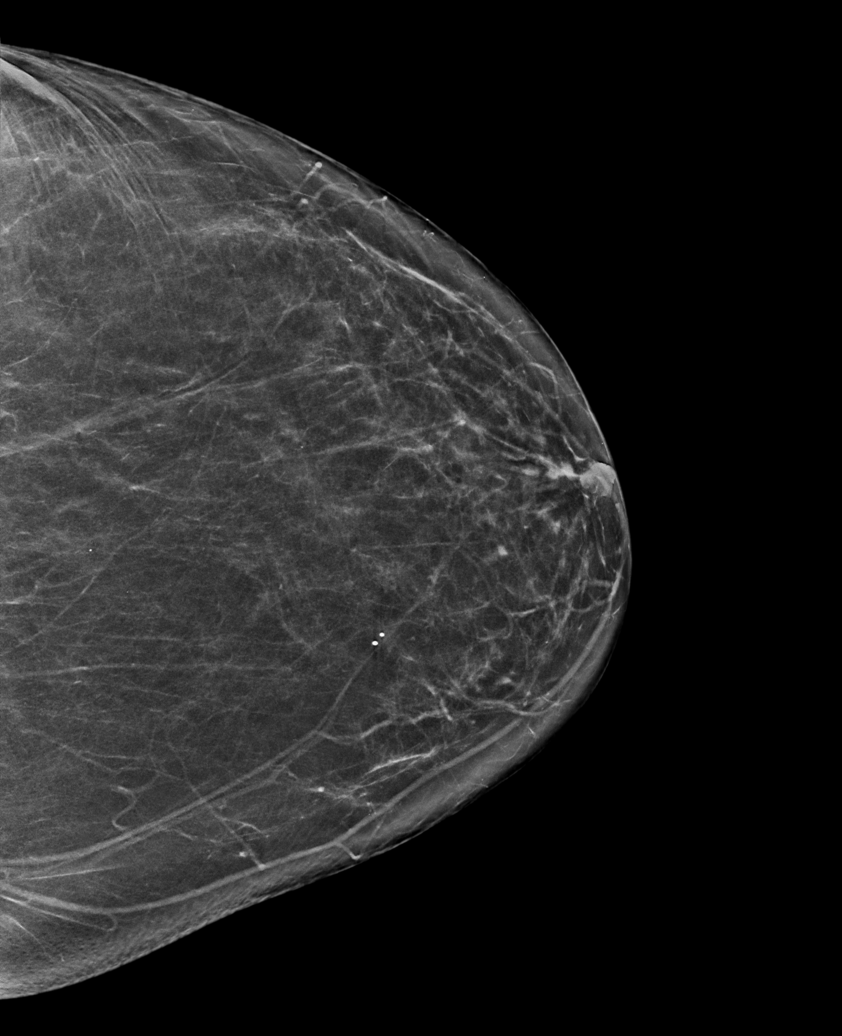

[L MLO synth-2D (1 of 2)]
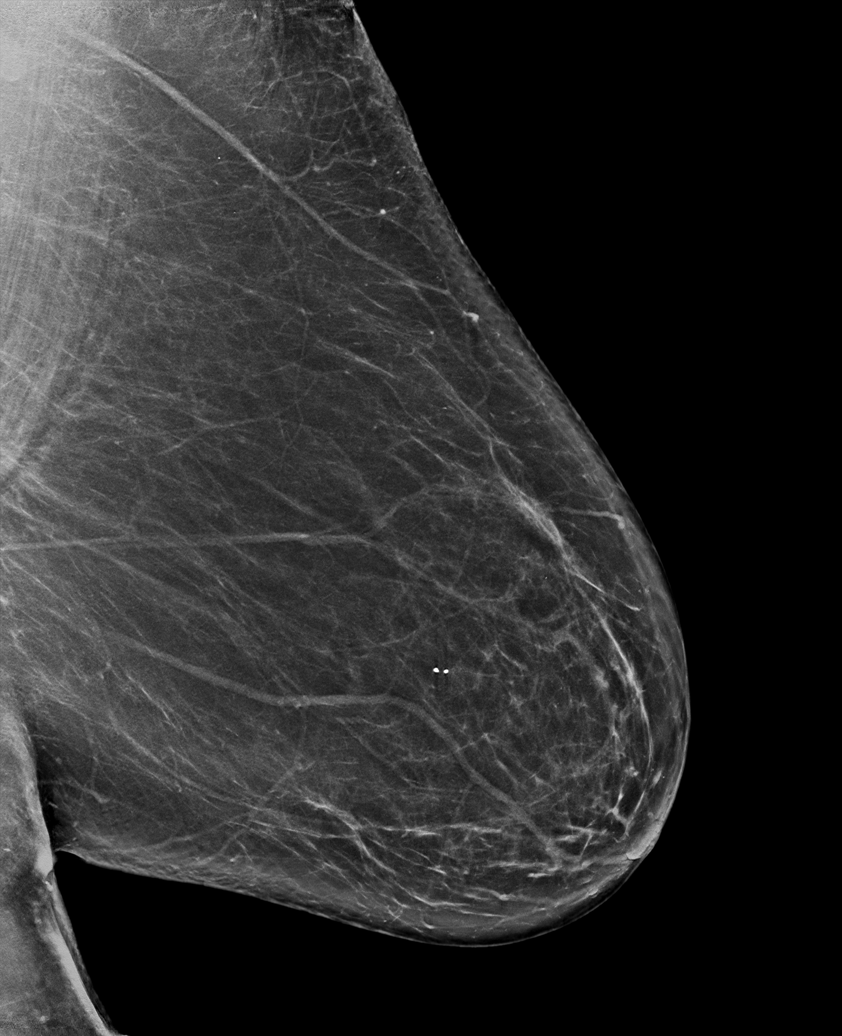

[R CC synth-2D]
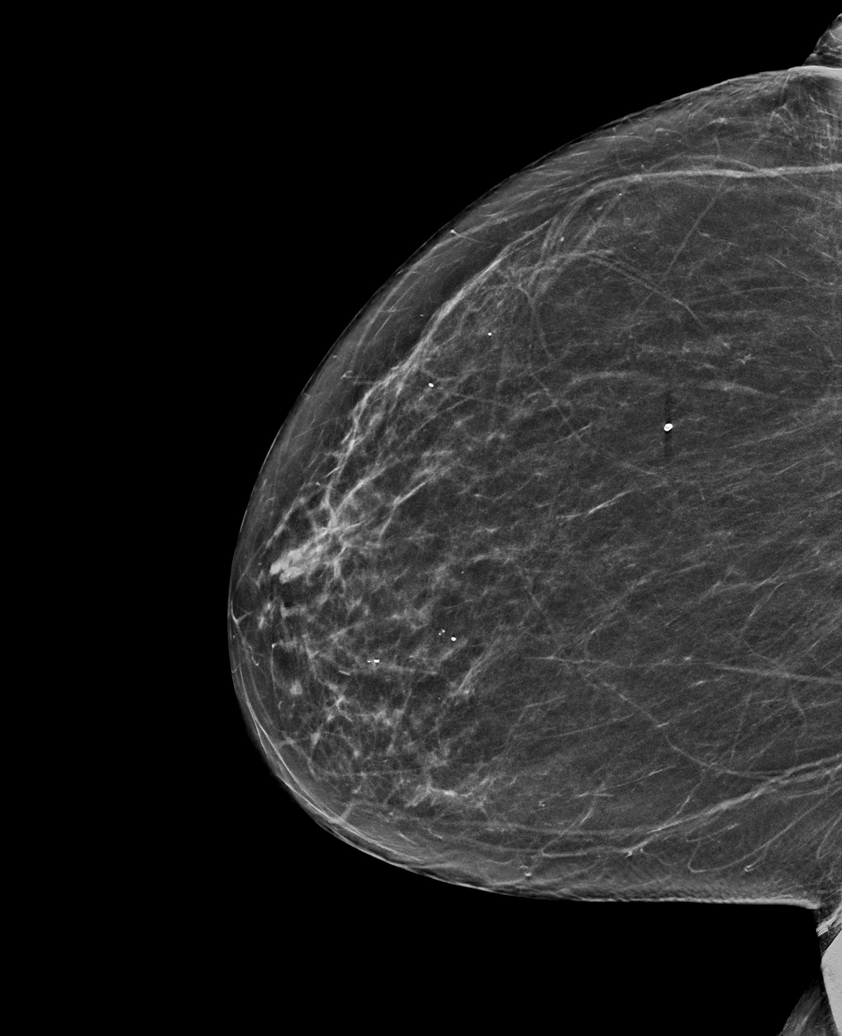

[L MLO synth-2D (2 of 2)]
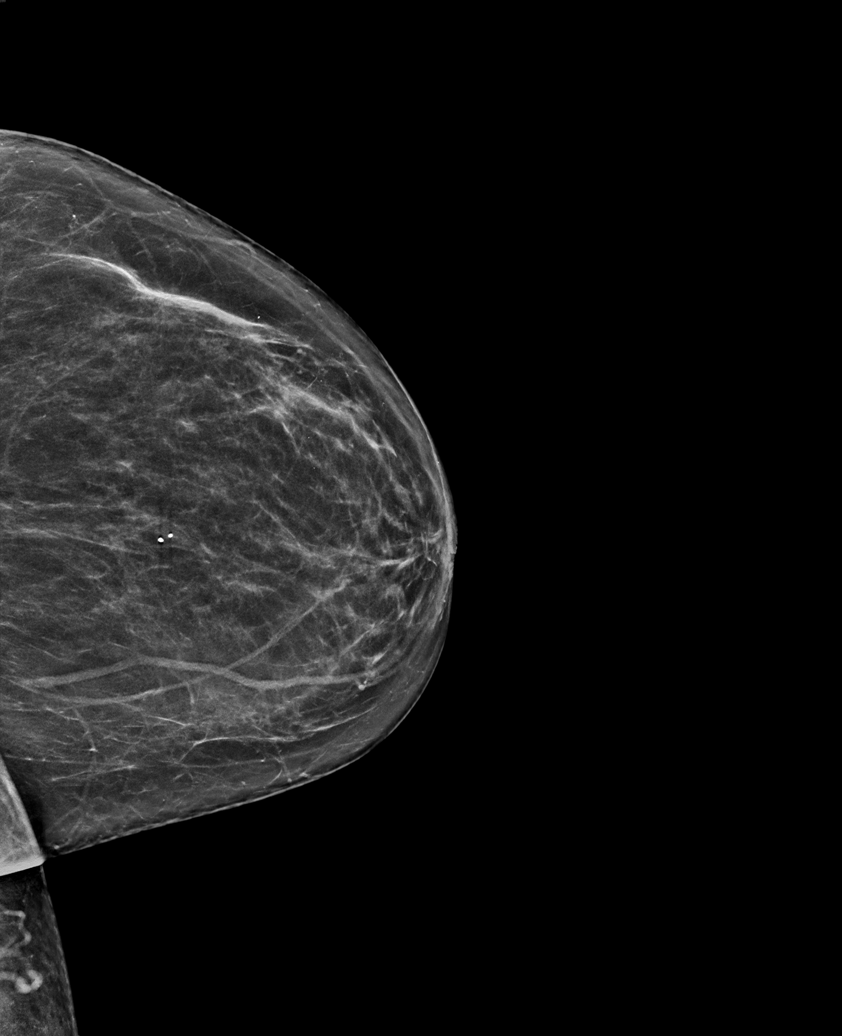

[R MLO synth-2D (1 of 2)]
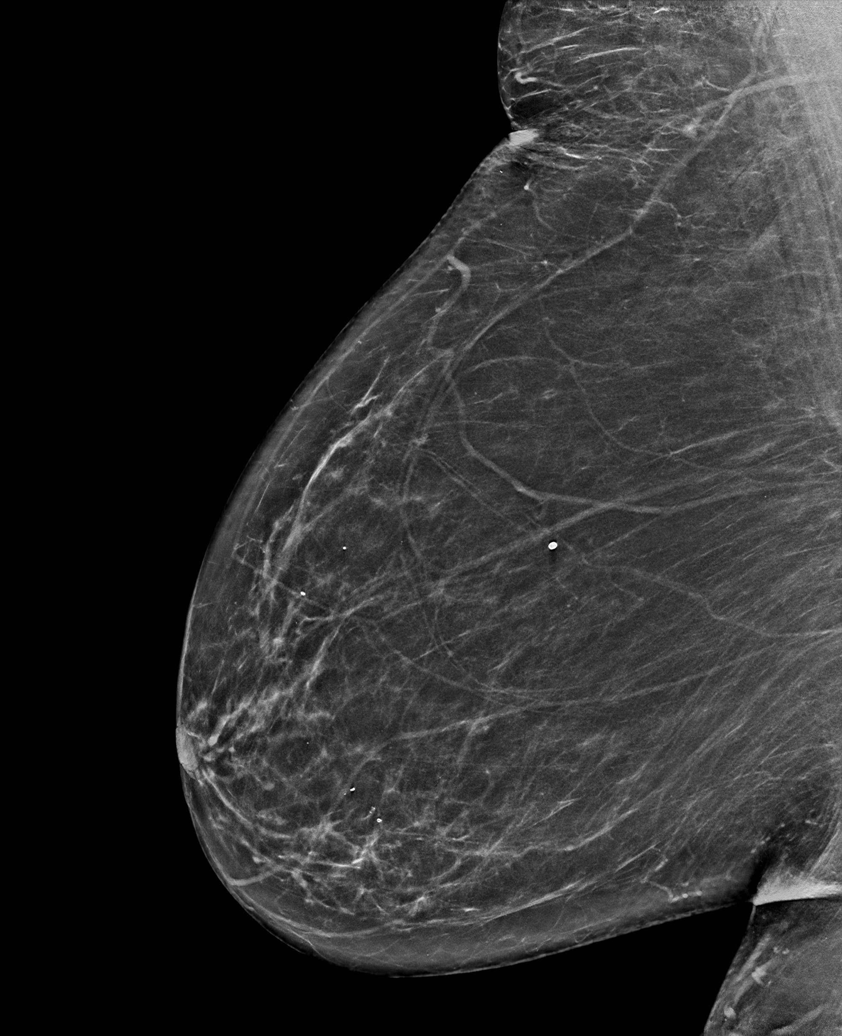

[R MLO synth-2D (2 of 2)]
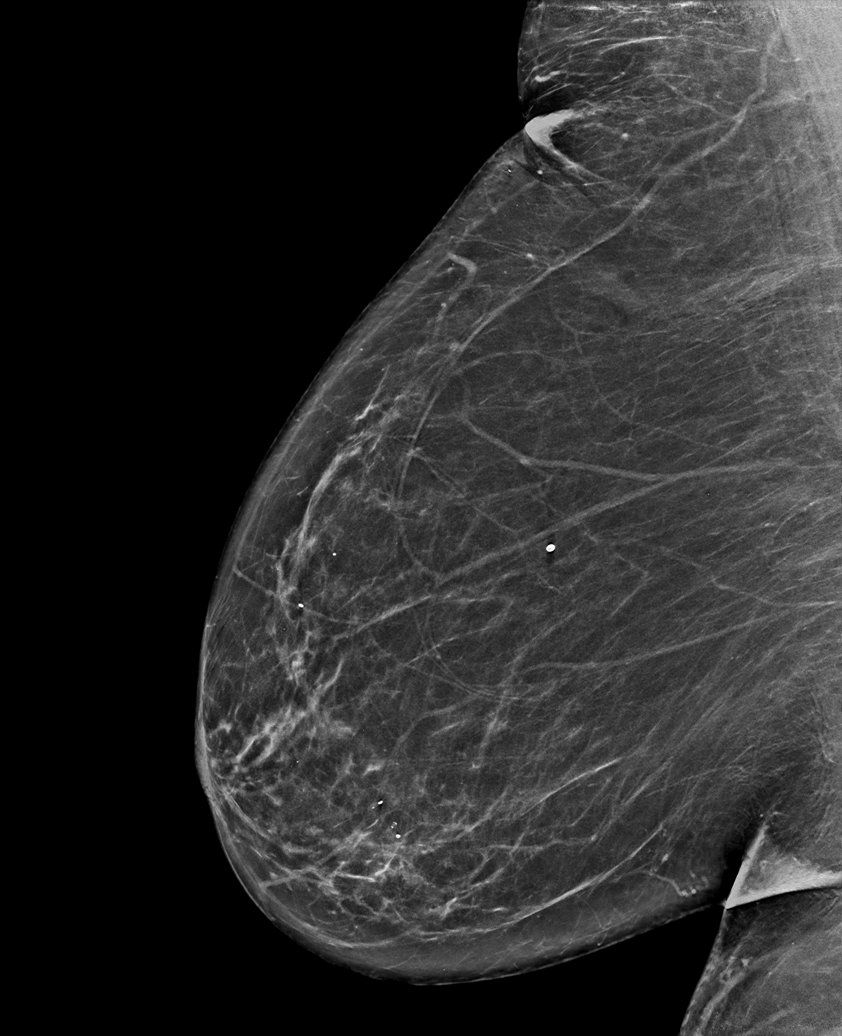

[6 of 36 positions shown; findings below may reference images not displayed]

ACR Breast Density Category b: There are scattered areas of
fibroglandular density.
FINDINGS: There are no findings suspicious for malignancy. Images were
processed with CAD.
IMPRESSION: No mammographic evidence of malignancy. A result letter of this
screening mammogram will be mailed directly to the patient.

RECOMMENDATION:
Screening mammogram in one year. (Code:[TQ])

BI-RADS CATEGORY  1: Negative.

## 2019-07-04 ENCOUNTER — Telehealth: Payer: Self-pay

## 2019-07-04 NOTE — Telephone Encounter (Signed)
Returned call to patient. She reports she restarted taking 450 mg of seroquel which helps with sleep. Discussed that she should not take that high dosage and that it is not indicated just for sleep. Her mood is stable. Provided medication education about Remeron, discussed its not the same as Klonopin. Discussed that seizure is listed as a side effect . However if she is OK , could try and see it it helps. She wants to try it. Its already at the pharmacy and she will go pick up.

## 2019-07-04 NOTE — Telephone Encounter (Signed)
pt called states she needs a refill on her quetiapine 300mg 

## 2019-07-08 ENCOUNTER — Telehealth: Payer: Self-pay

## 2019-07-08 NOTE — Telephone Encounter (Signed)
pt called states that her husband had but the mirtazapine in his desk drawer and she didnt know and she found it and stated taking it and it works fine

## 2019-07-08 NOTE — Telephone Encounter (Signed)
Thanks for letting me know!

## 2019-07-09 ENCOUNTER — Encounter: Payer: Self-pay | Admitting: Pain Medicine

## 2019-07-09 ENCOUNTER — Telehealth: Payer: Self-pay

## 2019-07-09 NOTE — Progress Notes (Signed)
Patient: Laura [name]  Service Category: E/M  Provider: Gaspar Cola, MD  DOB: 03-17-1945  DOS: 07/10/2019  Location: Office  MRN: [id number]  Setting: Ambulatory outpatient  Referring Provider: Adin Hector, MD  Type: Established Patient  Specialty: Interventional Pain Management  PCP: Adin Hector, MD  Location: Remote location  Delivery: TeleHealth     Virtual Encounter - Pain Management PROVIDER NOTE: Information contained herein reflects review and annotations entered in association with encounter. Interpretation of such information and data should be left to medically-trained personnel. Information provided to patient can be located elsewhere in the medical record under "Patient Instructions". Document created using STT-dictation technology, any transcriptional errors that may result from process are unintentional.    Contact & Pharmacy Preferred: [phone] Home: [phone] (home) Mobile: [phone] (mobile) E-mail: [email or web address]  [id number] [id number]: [phone]: [phone]  Pre-screening  Ms. Hue offered "in-person" vs "virtual" encounter. She indicated preferring virtual for this encounter.   Reason COVID-19*  Social distancing based on CDC and AMA recommendations.   I contacted EMonasia Lairon 07/10/2019 via telephone.      I clearly identified myself as FGaspar Cola MD. I verified that I was speaking with the correct person using two identifiers (Name: EJaimee Corum and date of birth: [id number].  Consent I sought verbal advanced consent from EMorene Rankinsfor virtual visit interactions. I informed Ms. Gulledge of possible security and privacy concerns, risks, and limitations associated with providing "not-in-person" medical evaluation and management services. I also informed Ms. Flannery of the availability of "in-person" appointments.  Finally, I informed her that there would be a charge for the virtual visit and that she could be  personally, fully or partially, financially responsible for it. Ms. AGentzlerexpressed understanding and agreed to proceed.   Historic Elements   Ms. EEtana Beetsis a 74y.o. year old, female patient evaluated today after her last contact with our practice on 07/09/2019. Ms. Chappelle  has a past medical history of Allergy, Anxiety, Arthritis, degenerative (10/08/2013), Asthma, Chronic kidney disease, Depression, Lumbar spinal stenosis with neurogenic claudication (11/07/2014), Major depression, single episode, [place] complete [place]), Memory loss, short term (03/19/2014), Seizure [place] (10/07/2014), Sleep apnea, Sleep apnea, and Thyroid disease. She also  has a past surgical history that includes Abdominal hysterectomy; Cesarean section; Replacement total knee (Left); and Hip surgery (Right). Ms. AWolaverhas a current medication list which includes the following prescription(s): albuterol, amlodipine, aspirin ec, azelastine, chlorthalidone, vitamin d, duloxetine, folic acid, hydroxychloroquine, infliximab, levothyroxine, levothyroxine, loratadine, methotrexate, metoprolol succinate, mirtazapine, multi-vitamins, [START ON 07/13/2019] oxycodone, [START ON 08/12/2019] oxycodone, [START ON 09/11/2019] oxycodone, rosuvastatin, simvastatin, spironolactone, and valacyclovir. She  reports that she has never smoked. She has never used smokeless tobacco. She reports that she does not drink alcohol and does not use drugs. Ms. ADunniganis allergic to bupropion and penicillins.   HPI  Today, she is being contacted for medication management.  The patient indicates doing well with the current medication regimen. No adverse reactions or side effects reported to the medications.   Pharmacotherapy Assessment  Analgesic: Oxycodone IR 5 mg every 6 hours (20 mg/day) MME/day:30 mg/day.   Monitoring: [place] PMP: PDMP  reviewed during this encounter.       Pharmacotherapy: No side-effects or adverse reactions reported. Compliance: No problems identified. Effectiveness: Clinically acceptable. Plan: Refer to "POC".  UDS:  Summary  Date Value  Ref Range Status  03/14/2018 FINAL  Final    Comment:    ==================================================================== TOXASSURE SELECT 13 (MW) ==================================================================== Test                             Result       Flag       Units Drug Present and Declared for Prescription Verification   Noroxycodone                   745          EXPECTED   ng/mg creat    Noroxycodone is an expected metabolite of oxycodone. Sources of    oxycodone include scheduled prescription medications. Drug Present not Declared for Prescription Verification   Tramadol                       4129         UNEXPECTED ng/mg creat   O-Desmethyltramadol            >5000        UNEXPECTED ng/mg creat   N-Desmethyltramadol            >5000        UNEXPECTED ng/mg creat    Source of tramadol is a prescription medication.    O-desmethyltramadol and N-desmethyltramadol are expected    metabolites of tramadol. Drug Absent but Declared for Prescription Verification   Oxycodone                      Not Detected UNEXPECTED ng/mg creat    Oxycodone is almost always present in patients taking this drug    consistently.  Absence of oxycodone could be due to lapse of time    since the last dose or unusual pharmacokinetics (rapid    metabolism). ==================================================================== Test                      Result    Flag   Units      Ref Range   Creatinine              100              mg/dL      >=20 ==================================================================== Declared Medications:  The flagging and interpretation on this report are based on the  following declared medications.  Unexpected results may arise from   inaccuracies in the declared medications.  **Note: The testing scope of this panel includes these medications:  Oxycodone  **Note: The testing scope of this panel does not include following  reported medications:  Quetiapine  Simvastatin  Spironolactone ==================================================================== For clinical consultation, please call [phone]. ====================================================================     Laboratory Chemistry Profile   Renal Lab Results  Component Value Date   BUN 24 (H) 10/09/2018   CREATININE 1.02 (H) 10/09/2018   GFRAA >60 10/09/2018   GFRNONAA 54 (L) 10/09/2018     Hepatic Lab Results  Component Value Date   AST 39 10/06/2018   ALT 24 10/06/2018   ALBUMIN 4.0 10/06/2018   ALKPHOS 70 10/06/2018     Electrolytes Lab Results  Component Value Date   NA 138 10/09/2018   K 4.0 10/09/2018   CL 98 10/09/2018   CALCIUM 8.7 (L) 10/09/2018   MG 2.3 10/08/2018     Bone Lab Results  Component Value Date   25OHVITD1 36 07/15/2015  25OHVITD2 3.6 07/15/2015   25OHVITD3 32 07/15/2015     Inflammation (CRP: Acute Phase) (ESR: Chronic Phase) Lab Results  Component Value Date   CRP 1.9 (H) 07/15/2015   ESRSEDRATE 32 (H) 07/15/2015   LATICACIDVEN 1.4 10/07/2018       Note: Above Lab results reviewed.   Imaging  MM 3D SCREEN BREAST BILATERAL CLINICAL DATA:  Screening.  EXAM: DIGITAL SCREENING BILATERAL MAMMOGRAM WITH TOMO AND CAD  COMPARISON:  Previous exam(s).  ACR Breast Density Category b: There are scattered areas of fibroglandular density.  FINDINGS: There are no findings suspicious for malignancy. Images were processed with CAD.  IMPRESSION: No mammographic evidence of malignancy. A result letter of this screening mammogram will be mailed directly to the patient.  RECOMMENDATION: Screening mammogram in one year. (Code:[id number]-B-01Y)  BI-RADS CATEGORY  1: Negative.  Electronically Signed    By: Everlean Alstrom M.D.   On: 06/25/2019 13:56  Assessment  The primary encounter diagnosis was Chronic pain syndrome. 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  Plan of Care  Problem-specific:  No problem-specific Assessment & Plan notes found for this encounter.  Ms. Aveline Daus has a current medication list which includes the following long-term medication(s): albuterol, azelastine, chlorthalidone, duloxetine, infliximab, levothyroxine, levothyroxine, loratadine, metoprolol succinate, mirtazapine, [START ON 07/13/2019] oxycodone, [START ON 08/12/2019] oxycodone, [START ON 09/11/2019] oxycodone, and spironolactone.  Pharmacotherapy (Medications Ordered): Meds ordered this encounter  Medications  . oxyCODONE (OXY IR/ROXICODONE) 5 MG immediate release tablet    Sig: Take 1 tablet (5 mg total) by mouth every 6 (six) hours as needed for severe pain. Must last 30 days    Dispense:  120 tablet    Refill:  0    Chronic Pain: STOP Act (Not applicable) Fill 1 day early if closed on refill date. Do not fill until: 07/13/2019. To last until: 08/12/2019. Avoid benzodiazepines within 8 hours of opioids  . oxyCODONE (OXY IR/ROXICODONE) 5 MG immediate release tablet    Sig: Take 1 tablet (5 mg total) by mouth every 6 (six) hours as needed for severe pain. Must last 30 days    Dispense:  120 tablet    Refill:  0    Chronic Pain: STOP Act (Not applicable) Fill 1 day early if closed on refill date. Do not fill until: 08/12/2019. To last until: 09/11/2019. Avoid benzodiazepines within 8 hours of opioids  . oxyCODONE (OXY IR/ROXICODONE) 5 MG immediate release tablet    Sig: Take 1 tablet (5 mg total) by mouth every 6  (six) hours as needed for severe pain. Must last 30 days    Dispense:  120 tablet    Refill:  0    Chronic Pain: STOP Act (Not applicable) Fill 1 day early if closed on refill date. Do not fill until: 09/11/2019. To last until: 10/11/2019. Avoid benzodiazepines within 8 hours of opioids   Orders:  Orders Placed This Encounter  Procedures  . ToxASSURE Select 13 (MW), Urine    Volume: 30 ml(s). Minimum 3 ml of urine is needed. Document temperature of fresh sample. Indications: Long term (current) use of opiate analgesic (R41.638)    Order Specific Question:   Release to patient    Answer:   Immediate   Follow-up plan:  Return in about 13 weeks (around 10/09/2019) for (F2F), (MM) to evaluate results of UDS.      Considering:   Therapeutic right IA Hyalgan knee injections  Diagnostic left hip IA joint injection  Possible left hip RFA  Diagnostic right IA knee joint injection (w/ steroid)  Diagnostic bilateral genicular NB  Possible bilateral genicular nerve RFA  Diagnostic bilateral lumbar facet block  Possible bilateral lumbar facet RFA  Diagnostic bilateral L3 and/or L5 TFESI  Diagnostic L3-4 vs L4-5 LESI    Palliative PRN treatment(s):   Therapeutic right IA Hyalgan knee injections Palliative left IA hip joint injection  Diagnostic left lumbar facet block #2      Recent Visits No visits were found meeting these conditions. Showing recent visits within past 90 days and meeting all other requirements Today's Visits Date Type Provider Dept  07/10/19 Telemedicine Milinda Pointer, MD [facility]  Showing today's visits and meeting all other requirements Future Appointments No visits were found meeting these conditions. Showing future appointments within next 90 days and meeting all other requirements  I discussed the assessment and treatment plan with the patient. The patient was provided an opportunity to ask questions and all were answered. The patient agreed with  the plan and demonstrated an understanding of the instructions.  Patient advised to call back or seek an in-person evaluation if the symptoms or condition worsens.  Duration of encounter: 12 minutes.  Note by: Gaspar Cola, MD Date: 07/10/2019; Time: 12:53 PM

## 2019-07-10 ENCOUNTER — Other Ambulatory Visit: Payer: Self-pay

## 2019-07-10 ENCOUNTER — Ambulatory Visit: Payer: Medicare Other | Attending: Pain Medicine | Admitting: Pain Medicine

## 2019-07-10 DIAGNOSIS — Z79899 Other long term (current) drug therapy: Secondary | ICD-10-CM

## 2019-07-10 DIAGNOSIS — M5442 Lumbago with sciatica, left side: Secondary | ICD-10-CM | POA: Diagnosis not present

## 2019-07-10 DIAGNOSIS — F1120 Opioid dependence, uncomplicated: Secondary | ICD-10-CM

## 2019-07-10 DIAGNOSIS — M5441 Lumbago with sciatica, right side: Secondary | ICD-10-CM

## 2019-07-10 DIAGNOSIS — M48061 Spinal stenosis, lumbar region without neurogenic claudication: Secondary | ICD-10-CM

## 2019-07-10 DIAGNOSIS — G894 Chronic pain syndrome: Secondary | ICD-10-CM | POA: Diagnosis not present

## 2019-07-10 DIAGNOSIS — M48062 Spinal stenosis, lumbar region with neurogenic claudication: Secondary | ICD-10-CM

## 2019-07-10 DIAGNOSIS — G8929 Other chronic pain: Secondary | ICD-10-CM

## 2019-07-10 DIAGNOSIS — Z6841 Body Mass Index (BMI) 40.0 and over, adult: Secondary | ICD-10-CM

## 2019-07-10 MED ORDER — OXYCODONE HCL 5 MG PO TABS: 5.0000 mg | ORAL_TABLET | Freq: Four times a day (QID) | ORAL | 0 refills | Status: DC | PRN

## 2019-07-19 LAB — TOXASSURE SELECT 13 (MW), URINE

## 2019-07-25 ENCOUNTER — Telehealth: Payer: Self-pay

## 2019-07-25 DIAGNOSIS — F5101 Primary insomnia: Secondary | ICD-10-CM

## 2019-07-25 MED ORDER — MIRTAZAPINE 7.5 MG PO TABS: 7.5000 mg | ORAL_TABLET | Freq: Every day | ORAL | 0 refills | Status: DC

## 2019-07-25 NOTE — Telephone Encounter (Signed)
I spoke with patient over the phone.  She reports that she started Remeron 7.5 mg which was helpful initially but then it stopped working therefore she increased the dose to 15 mg and on that she was still having poor sleep but she increase her dose of Remeron to 22.5 mg at night about 2 weeks ago and she has been sleeping very well and tolerating it well without any side effects.  Writer discussed increased risk of serotonin syndrome associated with using Remeron and Cymbalta together.  We discussed the warning signs of serotonin syndrome and if she notices any she should report to the emergency room.  She verbalized understanding and would like to continue Remeron 22.5 mg once a day.  Writer sent the prescription for 30-day supplies and forwarded with his message to her psychiatrist Dr. Elna Breslow

## 2019-07-25 NOTE — Telephone Encounter (Signed)
pt called states that she needs a refill on mirtazapine.  pt states that she is taking 3.  she states that dr. Elna Breslow prescribed one but told her that if she needed to take 2 she could.  but she stated that she didnt stay alseep so the 3 works better.

## 2019-08-02 ENCOUNTER — Other Ambulatory Visit: Payer: Self-pay | Admitting: Child and Adolescent Psychiatry

## 2019-08-02 DIAGNOSIS — F5101 Primary insomnia: Secondary | ICD-10-CM

## 2019-08-09 ENCOUNTER — Telehealth (INDEPENDENT_AMBULATORY_CARE_PROVIDER_SITE_OTHER): Payer: Medicare Other | Admitting: Psychiatry

## 2019-08-09 ENCOUNTER — Other Ambulatory Visit: Payer: Self-pay

## 2019-08-09 ENCOUNTER — Encounter: Payer: Self-pay | Admitting: Psychiatry

## 2019-08-09 DIAGNOSIS — F3342 Major depressive disorder, recurrent, in full remission: Secondary | ICD-10-CM

## 2019-08-09 DIAGNOSIS — F5101 Primary insomnia: Secondary | ICD-10-CM

## 2019-08-09 MED ORDER — DULOXETINE HCL 60 MG PO CPEP: 60.0000 mg | ORAL_CAPSULE | Freq: Every day | ORAL | 1 refills | Status: DC

## 2019-08-09 NOTE — Progress Notes (Signed)
Provider Location : [facility] Patient Location : Home  Virtual Visit via Telephone Note  I connected with Laura [name] on 08/09/19 at 11:40 AM EDT by telephone and verified that I am speaking with the correct person using two identifiers.   I discussed the limitations, risks, security and privacy concerns of performing an evaluation and management service by telephone and the availability of in person appointments. I also discussed with the patient that there may be a patient responsible charge related to this service. The patient expressed understanding and agreed to proceed.      I discussed the assessment and treatment plan with the patient. The patient was provided an opportunity to ask questions and all were answered. The patient agreed with the plan and demonstrated an understanding of the instructions.   The patient was advised to call back or seek an in-person evaluation if the symptoms worsen or if the condition fails to improve as anticipated.  [facility] MD OP Progress Note  08/09/2019 12:52 PM Trinice Breon  MRN:  [id number]  Chief Complaint:  Chief Complaint    Follow-up     HPI: Laura [name] is a 74 year old Caucasian female who has a history of MDD, primary insomnia, chronic pain was evaluated by phone today.  Patient today reports she is currently sleeping better on the Remeron 22.5 mg at bedtime.  She reports she increased the dosage after speaking to a provider here at the practice.  She reports she continues to take her other medications and so far her mood symptoms are stable.  She denies any suicidal thoughts.  She denies any homicidality or perceptual disturbances.  Patient denies any side effects to medications.  She reports she looks forward to her upcoming birthday celebration.  She is excited about that.  Patient denies any other concerns today.  Visit Diagnosis:    ICD-10-CM   1. MDD (major depressive disorder), recurrent, in full remission (HCC)   F33.42 DULoxetine (CYMBALTA) 60 MG capsule  2. Primary insomnia  F51.01     Past Psychiatric History: I have reviewed past psychiatric history from my progress note on 06/13/2018  Past Medical History:  Past Medical History:  Diagnosis Date  . Allergy   . Anxiety   . Arthritis, degenerative 10/08/2013   Overview:    a.  Lumbar spine/spinal stenosis/foot drop.   b.  Hands.   . Asthma   . Chronic kidney disease   . Depression   . 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 Major depression, single episode, in complete remission (HCC) 06/25/2015  . Memory loss, short term 03/19/2014  . Seizure (HCC) 10/07/2014  . Sleep apnea   . Sleep apnea   . Thyroid disease     Past Surgical History:  Procedure Laterality Date  . ABDOMINAL HYSTERECTOMY    . CESAREAN SECTION    . HIP SURGERY Right   . REPLACEMENT TOTAL KNEE Left     Family Psychiatric History: I have reviewed family psychiatric history from my progress note on 06/13/2018  Family History:  Family History  Problem Relation Age of Onset  . Hypertension Mother   . Stroke Mother   . Heart attack Father   . Alcohol abuse Father   . Depression Father   . Post-traumatic stress disorder Father   . Rheum arthritis Sister   . Hypertension Sister   . Depression Sister   . Breast cancer Neg Hx     Social History: I have reviewed social history from my  progress note on 06/13/2018 Social History   Socioeconomic History  . Marital status: Married    Spouse name: Not on file  . Number of children: Not on file  . Years of education: Not on file  . Highest education level: Not on file  Occupational History    Comment: retired  Tobacco Use  . Smoking status: Never Smoker  . Smokeless tobacco: Never Used  Vaping Use  . Vaping Use: Never used  Substance and Sexual Activity  . Alcohol use: No    Alcohol/week: 0.0 standard drinks  . Drug use: No  . Sexual activity: Not Currently  Other Topics Concern  . Not on  file  Social History Narrative  . Not on file   Social Determinants of Health   Financial Resource Strain:   . Difficulty of Paying Living Expenses:   Food Insecurity:   . Worried About Programme researcher, broadcasting/film/video in the Last Year:   . Barista in the Last Year:   Transportation Needs:   . Freight forwarder (Medical):   Marland Kitchen Lack of Transportation (Non-Medical):   Physical Activity:   . Days of Exercise per Week:   . Minutes of Exercise per Session:   Stress:   . Feeling of Stress :   Social Connections:   . Frequency of Communication with Friends and Family:   . Frequency of Social Gatherings with Friends and Family:   . Attends Religious Services:   . Active Member of Clubs or Organizations:   . Attends Banker Meetings:   Marland Kitchen Marital Status:     Allergies:  Allergies  Allergen Reactions  . Bupropion   . Penicillins Rash    Metabolic Disorder Labs: No results found for: HGBA1C, MPG No results found for: PROLACTIN No results found for: CHOL, TRIG, HDL, CHOLHDL, VLDL, LDLCALC Lab Results  Component Value Date   TSH 0.383 10/07/2018    Therapeutic Level Labs: No results found for: LITHIUM No results found for: VALPROATE No components found for:  CBMZ  Current Medications: Current Outpatient Medications  Medication Sig Dispense Refill  . albuterol (VENTOLIN HFA) 108 (90 Base) MCG/ACT inhaler Inhale 2 puffs into the lungs every 6 (six) hours as needed for wheezing or shortness of breath. 18 g 3  . amLODipine (NORVASC) 10 MG tablet TAKE 0.5 TABLETS BY MOUTH ONCE NIGHTLY    . aspirin EC 81 MG tablet Take by mouth.    Marland Kitchen azelastine (ASTELIN) 0.1 % nasal spray Place into the nose.    . chlorthalidone (HYGROTON) 25 MG tablet TAKE 1/2 TABLET (12.5MG ) BY MOUTH EVERY DAY  11  . Cholecalciferol (VITAMIN D) 2000 UNITS tablet Take by mouth daily.     . DULoxetine (CYMBALTA) 60 MG capsule Take 1 capsule (60 mg total) by mouth daily. 90 capsule 1  . folic acid  (FOLVITE) 1 MG tablet Take 1 mg by mouth daily.    . hydroxychloroquine (PLAQUENIL) 200 MG tablet Take 100 mg by mouth daily.     Marland Kitchen inFLIXimab (REMICADE) 100 MG injection Inject 100 mg into the vein every 8 (eight) weeks.    Marland Kitchen levothyroxine (SYNTHROID) 50 MCG tablet Take 50 mcg by mouth at bedtime.     Marland Kitchen levothyroxine (SYNTHROID, LEVOTHROID) 200 MCG tablet TAKE 1 TABLET ONCE DAILY- ON AN EMPTY STOMACH WITH A GLASS OF WATER 30-60 MINUTES BEFORE BREAKFAST  5  . loratadine (CLARITIN) 10 MG tablet Take 10 mg by mouth daily.     Marland Kitchen  methotrexate (RHEUMATREX) 2.5 MG tablet TAKE 4 TABLETS (10 MG TOTAL) BY MOUTH EVERY 7 (SEVEN) DAYS WITH A MEAL  5  . metoprolol succinate (TOPROL-XL) 100 MG 24 hr tablet Take 1 tablet (100 mg total) by mouth daily. Take with or immediately following a meal. 30 tablet 0  . mirtazapine (REMERON) 7.5 MG tablet TAKE 1-3 TABLETS (7.5-22.5 MG TOTAL) BY MOUTH AT BEDTIME. FOR SLEEP 270 tablet 0  . Multiple Vitamin (MULTI-VITAMINS) TABS Take by mouth.    . oxyCODONE (OXY IR/ROXICODONE) 5 MG immediate release tablet Take 1 tablet (5 mg total) by mouth every 6 (six) hours as needed for severe pain. Must last 30 days 120 tablet 0  . [START ON 08/12/2019] oxyCODONE (OXY IR/ROXICODONE) 5 MG immediate release tablet Take 1 tablet (5 mg total) by mouth every 6 (six) hours as needed for severe pain. Must last 30 days 120 tablet 0  . [START ON 09/11/2019] oxyCODONE (OXY IR/ROXICODONE) 5 MG immediate release tablet Take 1 tablet (5 mg total) by mouth every 6 (six) hours as needed for severe pain. Must last 30 days 120 tablet 0  . rosuvastatin (CRESTOR) 5 MG tablet Take 5 mg by mouth 2 (two) times a week.    . simvastatin (ZOCOR) 10 MG tablet Take 10 mg by mouth daily.    Marland Kitchen spironolactone (ALDACTONE) 25 MG tablet TAKE 1/2 (ONE-HALF) TABLET BY MOUTH DAILY  11  . valACYclovir (VALTREX) 1000 MG tablet PLEASE SEE ATTACHED FOR DETAILED DIRECTIONS     No current facility-administered medications for this  visit.     Musculoskeletal: Strength & Muscle Tone: UTA Gait & Station: UTA Patient leans: N/A  Psychiatric Specialty Exam: Review of Systems  Psychiatric/Behavioral: Negative for agitation, behavioral problems, confusion, decreased concentration, dysphoric mood, hallucinations, self-injury, sleep disturbance and suicidal ideas. The patient is not nervous/anxious and is not hyperactive.   All other systems reviewed and are negative.   There were no vitals taken for this visit.There is no height or weight on file to calculate BMI.  General Appearance: UTA  Eye Contact:  UTA  Speech:  Clear and Coherent  Volume:  Normal  Mood:  Euthymic  Affect:  UTA  Thought Process:  Goal Directed and Descriptions of Associations: Intact  Orientation:  Full (Time, Place, and Person)  Thought Content: Logical   Suicidal Thoughts:  No  Homicidal Thoughts:  No  Memory:  Immediate;   Fair Recent;   Fair Remote;   Fair  Judgement:  Fair  Insight:  Fair  Psychomotor Activity:  UTA  Concentration:  Concentration: Fair and Attention Span: Fair  Recall:  [employer] of Knowledge: Fair  Language: Fair  Akathisia:  No  Handed:  Right  AIMS (if indicated): UTA  Assets:  Communication Skills Desire for Improvement Housing Intimacy Social Support  ADL's:  Intact  Cognition: WNL  Sleep:  Fair   Screenings: PHQ2-9     Office Visit from 03/14/2018 in [facility] Office Visit from 06/19/2017 in [facility] Office Visit from 05/29/2017 in [facility] Office Visit from 04/18/2017 in [facility] Clinical Support from 04/13/2017 in [facility]  PHQ-2 Total Score 0 0 2 0 0       Assessment and Plan: Laura [name] is a 74 year old Caucasian female, married, retired, lives in [place], has a history of depression, currently in remission, primary insomnia,  hypertension, congestive heart failure, GERD, diabetes  melitis, hypothyroidism, rheumatoid arthritis, chronic pain, history of knee replacement was evaluated by phone today.  She is currently making progress on the increased dosage of mirtazapine.  Discussed plan as noted below.  Plan MDD in remission We will reduce Cymbalta to 60 mg p.o. daily.  Discussed with patient that if she does not tolerate the lower dosage to let writer know and her dosage can be increased to her Cymbalta 80 or 90 milligrams and that day we are going down gradually.  Insomnia-stable Continue mirtazapine 22.5 mg p.o. nightly Discussed with patient drug to drug interaction between her medications including serotonin syndrome.  She is currently being tapered down on the Cymbalta since the mirtazapine is at a higher dosage.  Follow-up in clinic in 8 weeks or sooner if needed.  I have spent atleast 20 minutes non face to face with patient today. More than 50 % of the time was spent for preparing to see the patient ( e.g., review of test, records ),ordering medications and test ,psychoeducation and supportive psychotherapy and care coordination,as well as documenting clinical information in electronic health record. This note was generated in part or whole with voice recognition software. Voice recognition is usually quite accurate but there are transcription errors that can and very often do occur. I apologize for any typographical errors that were not detected and corrected.      Jomarie Longs, MD 08/09/2019, 12:52 PM

## 2019-08-29 ENCOUNTER — Telehealth: Payer: Self-pay

## 2019-08-29 NOTE — Telephone Encounter (Signed)
pt called states that she could not take the last medication that you gave her.  she had N/V with it and so she taking melatonin

## 2019-08-29 NOTE — Telephone Encounter (Signed)
OK thanks for letting me know. Melatonin is good to continue if she is doing well.

## 2019-09-05 ENCOUNTER — Telehealth: Payer: Self-pay

## 2019-09-05 DIAGNOSIS — F5101 Primary insomnia: Secondary | ICD-10-CM

## 2019-09-05 DIAGNOSIS — F3342 Major depressive disorder, recurrent, in full remission: Secondary | ICD-10-CM

## 2019-09-05 MED ORDER — HYDROXYZINE PAMOATE 25 MG PO CAPS: 25.0000 mg | ORAL_CAPSULE | Freq: Every evening | ORAL | 0 refills | Status: DC | PRN

## 2019-09-05 NOTE — Telephone Encounter (Signed)
Returned call to patient.  Patient reports she stopped the mirtazapine due to nausea.  She is trying the melatonin however it worked for a few days and now it is not working anymore.  Patient has tried several medications like Seroquel, trazodone, Ambien, temazepam.  Start hydroxyzine 25 to 50 mg at bedtime as needed.  Provided medication education.  She will let writer know if she continues to struggle with sleep.

## 2019-09-05 NOTE — Telephone Encounter (Signed)
pt called states that the melatonin has stopped working for her now she takin 10mg 

## 2019-09-09 ENCOUNTER — Telehealth: Payer: Self-pay | Admitting: *Deleted

## 2019-09-09 NOTE — Telephone Encounter (Signed)
Pharmacy called and they do have the script to fill on 09/11/19. Patient called and informed.

## 2019-09-19 ENCOUNTER — Other Ambulatory Visit: Payer: Self-pay | Admitting: Psychiatry

## 2019-09-19 ENCOUNTER — Telehealth: Payer: Self-pay

## 2019-09-19 DIAGNOSIS — F5101 Primary insomnia: Secondary | ICD-10-CM

## 2019-09-19 MED ORDER — HYDROXYZINE PAMOATE 25 MG PO CAPS: 25.0000 mg | ORAL_CAPSULE | Freq: Every evening | ORAL | 1 refills | Status: DC | PRN

## 2019-09-19 NOTE — Telephone Encounter (Signed)
I have sent hydroxyzine to pharmacy. 

## 2019-09-19 NOTE — Telephone Encounter (Signed)
pt called left message that the hydroxyzine is working taking 2 at night she will need refill for two a night #60

## 2019-09-27 ENCOUNTER — Other Ambulatory Visit: Payer: Self-pay

## 2019-09-27 ENCOUNTER — Telehealth (INDEPENDENT_AMBULATORY_CARE_PROVIDER_SITE_OTHER): Payer: Medicare Other | Admitting: Psychiatry

## 2019-09-27 ENCOUNTER — Encounter: Payer: Self-pay | Admitting: Psychiatry

## 2019-09-27 DIAGNOSIS — F3342 Major depressive disorder, recurrent, in full remission: Secondary | ICD-10-CM | POA: Diagnosis not present

## 2019-09-27 DIAGNOSIS — F5101 Primary insomnia: Secondary | ICD-10-CM

## 2019-09-27 MED ORDER — HYDROXYZINE PAMOATE 25 MG PO CAPS: 25.0000 mg | ORAL_CAPSULE | Freq: Every evening | ORAL | 1 refills | Status: DC | PRN

## 2019-09-27 NOTE — Progress Notes (Signed)
Provider Location : [facility] Patient Location : Home  Participants: Patient , Provider  Virtual Visit via Telephone Note  I connected with Laura [name] on 09/27/19 at 11:00 AM EDT by telephone and verified that I am speaking with the correct person using two identifiers.   I discussed the limitations, risks, security and privacy concerns of performing an evaluation and management service by telephone and the availability of in person appointments. I also discussed with the patient that there may be a patient responsible charge related to this service. The patient expressed understanding and agreed to proceed.      I discussed the assessment and treatment plan with the patient. The patient was provided an opportunity to ask questions and all were answered. The patient agreed with the plan and demonstrated an understanding of the instructions.   The patient was advised to call back or seek an in-person evaluation if the symptoms worsen or if the condition fails to improve as anticipated.   BH MD OP Progress Note  09/27/2019 12:30 PM Laura [name]  MRN:  [id number]  Chief Complaint:  Chief Complaint    Follow-up     HPI: Laura [name] is a 74 year old Caucasian female who has a history of MDD, primary insomnia, chronic pain was evaluated by phone today.  Patient today reports she is currently doing well on the current medication regimen.  She reports she is taking the hydroxyzine 25 to 50 mg which does help her to sleep. One night she had to take a 75 mg since she could not sleep.  She however reports overall she is doing okay on just 25 to 50 mg and she wants to stay on this medication.  She denies side effects.  Patient denies any significant sadness.  She does report she had an incident that happened to her recently when she went to visit her son in [place].  She reports she is overweight and has difficulty climbing stairs due to her multiple health problems.  She  reports she needed someone to come and lift her up over the steps and she did not feel good about it and felt humiliated.  Patient reports she hence does not want to go over to her son's place anymore because of the whole ordeal of climbing over the stairs to get into his house.  This does worry her some.  She had a discussion with her son about this.  Patient otherwise reports she is doing well on the medications.  She denies any suicidality, homicidality or perceptual disturbances.  She denies side effects to medications.  Patient denies any other concerns today.  Visit Diagnosis:    ICD-10-CM   1. MDD (major depressive disorder), recurrent, in full remission (HCC)  F33.42   2. Primary insomnia  F51.01 hydrOXYzine (VISTARIL) 25 MG capsule    Past Psychiatric History: I have reviewed past psychiatric history from my progress note on 06/13/2018  Past Medical History:  Past Medical History:  Diagnosis Date  . Allergy   . Anxiety   . Arthritis, degenerative 10/08/2013   Overview:    a.  Lumbar spine/spinal stenosis/foot drop.   b.  Hands.   . Asthma   . Chronic kidney disease   . Depression   . 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 Major depression, single episode, in complete remission (HCC) 06/25/2015  . Memory loss, short term 03/19/2014  . Seizure (HCC) 10/07/2014  . Sleep apnea   . Sleep apnea   . Thyroid  disease     Past Surgical History:  Procedure Laterality Date  . ABDOMINAL HYSTERECTOMY    . CESAREAN SECTION    . HIP SURGERY Right   . REPLACEMENT TOTAL KNEE Left     Family Psychiatric History: Reviewed family psychiatric history from my progress note on 06/13/2018  Family History:  Family History  Problem Relation Age of Onset  . Hypertension Mother   . Stroke Mother   . Heart attack Father   . Alcohol abuse Father   . Depression Father   . Post-traumatic stress disorder Father   . Rheum arthritis Sister   . Hypertension Sister   .  Depression Sister   . Breast cancer Neg Hx     Social History: Reviewed social history from my progress note on 06/13/2018 Social History   Socioeconomic History  . Marital status: Married    Spouse name: Not on file  . Number of children: Not on file  . Years of education: Not on file  . Highest education level: Not on file  Occupational History    Comment: retired  Tobacco Use  . Smoking status: Never Smoker  . Smokeless tobacco: Never Used  Vaping Use  . Vaping Use: Never used  Substance and Sexual Activity  . Alcohol use: No    Alcohol/week: 0.0 standard drinks  . Drug use: No  . Sexual activity: Not Currently  Other Topics Concern  . Not on file  Social History Narrative  . Not on file   Social Determinants of Health   Financial Resource Strain:   . Difficulty of Paying Living Expenses: Not on file  Food Insecurity:   . Worried About Programme researcher, broadcasting/film/video in the Last Year: Not on file  . Ran Out of Food in the Last Year: Not on file  Transportation Needs:   . Lack of Transportation (Medical): Not on file  . Lack of Transportation (Non-Medical): Not on file  Physical Activity:   . Days of Exercise per Week: Not on file  . Minutes of Exercise per Session: Not on file  Stress:   . Feeling of Stress : Not on file  Social Connections:   . Frequency of Communication with Friends and Family: Not on file  . Frequency of Social Gatherings with Friends and Family: Not on file  . Attends Religious Services: Not on file  . Active Member of Clubs or Organizations: Not on file  . Attends Banker Meetings: Not on file  . Marital Status: Not on file    Allergies:  Allergies  Allergen Reactions  . Bupropion   . Penicillins Rash    Metabolic Disorder Labs: No results found for: HGBA1C, MPG No results found for: PROLACTIN No results found for: CHOL, TRIG, HDL, CHOLHDL, VLDL, LDLCALC Lab Results  Component Value Date   TSH 0.383 10/07/2018     Therapeutic Level Labs: No results found for: LITHIUM No results found for: VALPROATE No components found for:  CBMZ  Current Medications: Current Outpatient Medications  Medication Sig Dispense Refill  . albuterol (VENTOLIN HFA) 108 (90 Base) MCG/ACT inhaler Inhale 2 puffs into the lungs every 6 (six) hours as needed for wheezing or shortness of breath. 18 g 3  . aspirin EC 81 MG tablet Take by mouth.    Marland Kitchen azelastine (ASTELIN) 0.1 % nasal spray Place into the nose.    . chlorthalidone (HYGROTON) 25 MG tablet TAKE 1/2 TABLET (12.5MG ) BY MOUTH EVERY DAY  11  .  Cholecalciferol (VITAMIN D) 2000 UNITS tablet Take by mouth daily.     . DULoxetine (CYMBALTA) 60 MG capsule Take 1 capsule (60 mg total) by mouth daily. 90 capsule 1  . folic acid (FOLVITE) 1 MG tablet Take 1 mg by mouth daily.    . hydroxychloroquine (PLAQUENIL) 200 MG tablet Take 100 mg by mouth daily.     . hydrOXYzine (VISTARIL) 25 MG capsule Take 1-2 capsules (25-50 mg total) by mouth at bedtime as needed. For sleep 180 capsule 1  . inFLIXimab (REMICADE) 100 MG injection Inject 100 mg into the vein every 8 (eight) weeks.    Marland Kitchen levothyroxine (SYNTHROID) 50 MCG tablet Take 50 mcg by mouth at bedtime.     Marland Kitchen levothyroxine (SYNTHROID, LEVOTHROID) 200 MCG tablet TAKE 1 TABLET ONCE DAILY- ON AN EMPTY STOMACH WITH A GLASS OF WATER 30-60 MINUTES BEFORE BREAKFAST  5  . loratadine (CLARITIN) 10 MG tablet Take 10 mg by mouth daily.     . methotrexate (RHEUMATREX) 2.5 MG tablet TAKE 4 TABLETS (10 MG TOTAL) BY MOUTH EVERY 7 (SEVEN) DAYS WITH A MEAL  5  . metoprolol succinate (TOPROL-XL) 100 MG 24 hr tablet Take 1 tablet (100 mg total) by mouth daily. Take with or immediately following a meal. 30 tablet 0  . Multiple Vitamin (MULTI-VITAMINS) TABS Take by mouth.    . rosuvastatin (CRESTOR) 5 MG tablet Take 5 mg by mouth 2 (two) times a week.    . spironolactone (ALDACTONE) 25 MG tablet TAKE 1/2 (ONE-HALF) TABLET BY MOUTH DAILY  11  .  valACYclovir (VALTREX) 1000 MG tablet PLEASE SEE ATTACHED FOR DETAILED DIRECTIONS    . amLODipine (NORVASC) 10 MG tablet TAKE 0.5 TABLETS BY MOUTH ONCE NIGHTLY (Patient not taking: Reported on 09/27/2019)    . inFLIXimab-dyyb 100 MG SOLR Inject into the vein. (Patient not taking: Reported on 09/27/2019)    . oxyCODONE (OXY IR/ROXICODONE) 5 MG immediate release tablet Take 1 tablet (5 mg total) by mouth every 6 (six) hours as needed for severe pain. Must last 30 days 120 tablet 0  . oxyCODONE (OXY IR/ROXICODONE) 5 MG immediate release tablet Take 1 tablet (5 mg total) by mouth every 6 (six) hours as needed for severe pain. Must last 30 days 120 tablet 0  . oxyCODONE (OXY IR/ROXICODONE) 5 MG immediate release tablet Take 1 tablet (5 mg total) by mouth every 6 (six) hours as needed for severe pain. Must last 30 days 120 tablet 0  . simvastatin (ZOCOR) 10 MG tablet Take 10 mg by mouth daily. (Patient not taking: Reported on 09/27/2019)     No current facility-administered medications for this visit.     Musculoskeletal: Strength & Muscle Tone: UTA Gait & Station: UTA Patient leans: N/A  Psychiatric Specialty Exam: Review of Systems  Musculoskeletal: Positive for joint swelling.  Psychiatric/Behavioral: Negative for agitation, behavioral problems, confusion, decreased concentration, dysphoric mood, hallucinations, self-injury, sleep disturbance and suicidal ideas. The patient is nervous/anxious. The patient is not hyperactive.   All other systems reviewed and are negative.   There were no vitals taken for this visit.There is no height or weight on file to calculate BMI.  General Appearance: UTA  Eye Contact:  UTA  Speech:  Clear and Coherent  Volume:  Normal  Mood:  Euthymic  Affect:  UTA  Thought Process:  Goal Directed and Descriptions of Associations: Intact  Orientation:  Full (Time, Place, and Person)  Thought Content: Logical   Suicidal Thoughts:  No  Homicidal  Thoughts:  No  Memory:   Immediate;   Fair Recent;   Fair Remote;   Fair  Judgement:  Fair  Insight:  Fair  Psychomotor Activity:  UTA  Concentration:  Concentration: Fair and Attention Span: Fair  Recall:  [employer] of Knowledge: Fair  Language: Fair  Akathisia:  No  Handed:  Right  AIMS (if indicated): UTA  Assets:  Communication Skills Desire for Improvement Social Support  ADL's:  Intact  Cognition: WNL  Sleep:  Fair   Screenings: PHQ2-9     Office Visit from 03/14/2018 in [facility] Office Visit from 06/19/2017 in [facility] Office Visit from 05/29/2017 in [facility] Office Visit from 04[facility]18/2017 in [facility], [facility] Clinical Support from 04/13/2017 in [facility]  PHQ-2 Total Score 0 0 2 0 0       Assessment and Plan: Laura [name] is a 74 year old Caucasian female, married, retired, lives in [place], has a history of depression, currently in remission, primary insomnia, hypertension, congestive heart failure, GERD, diabetes melitis, hypothyroidism, rheumatoid arthritis, chronic pain, history of knee replacement was evaluated by phone today.  Patient is currently struggling with psychosocial stressors as noted above however overall she is doing well on the medication.  Plan as noted below.  Plan MDD in remission Continue Cymbalta 60 mg p.o. daily-reduced dosage.  Insomnia-stable Hydroxyzine 25 to 50 mg p.o. nightly as needed   Provided supportive psychotherapy.  Follow-up in clinic in 2 to 3 months or sooner if needed.  I have spent atleast 20 minutes non face to face with patient today. More than 50 % of the time was spent for preparing to see the patient ( e.g., review of test, records ),ordering medications and test ,psychoeducation and supportive psychotherapy and care coordination,as well as documenting clinical information in electronic health  record. This note was generated in part or whole with voice recognition software. Voice recognition is usually quite accurate but there are transcription errors that can and very often do occur. I apologize for any typographical errors that were not detected and corrected.      Laura Longs, MD 09/27/2019, 12:30 PM

## 2019-10-01 ENCOUNTER — Other Ambulatory Visit: Payer: Self-pay | Admitting: Psychiatry

## 2019-10-01 DIAGNOSIS — F3342 Major depressive disorder, recurrent, in full remission: Secondary | ICD-10-CM

## 2019-10-03 ENCOUNTER — Emergency Department: Payer: Medicare Other

## 2019-10-03 ENCOUNTER — Emergency Department
Admission: EM | Admit: 2019-10-03 | Discharge: 2019-10-03 | Disposition: A | Discharge status: Home / Self Care | Payer: Medicare Other | Attending: Emergency Medicine | Admitting: Emergency Medicine

## 2019-10-03 ENCOUNTER — Encounter: Payer: Self-pay | Admitting: Emergency Medicine

## 2019-10-03 ENCOUNTER — Other Ambulatory Visit: Payer: Self-pay

## 2019-10-03 DIAGNOSIS — J45909 Unspecified asthma, uncomplicated: Secondary | ICD-10-CM | POA: Diagnosis not present

## 2019-10-03 DIAGNOSIS — Z79890 Hormone replacement therapy (postmenopausal): Secondary | ICD-10-CM | POA: Insufficient documentation

## 2019-10-03 DIAGNOSIS — R531 Weakness: Secondary | ICD-10-CM | POA: Insufficient documentation

## 2019-10-03 DIAGNOSIS — Z96652 Presence of left artificial knee joint: Secondary | ICD-10-CM | POA: Insufficient documentation

## 2019-10-03 DIAGNOSIS — R233 Spontaneous ecchymoses: Secondary | ICD-10-CM

## 2019-10-03 DIAGNOSIS — Z7982 Long term (current) use of aspirin: Secondary | ICD-10-CM | POA: Insufficient documentation

## 2019-10-03 DIAGNOSIS — I5020 Unspecified systolic (congestive) heart failure: Secondary | ICD-10-CM | POA: Insufficient documentation

## 2019-10-03 DIAGNOSIS — N183 Chronic kidney disease, stage 3 unspecified: Secondary | ICD-10-CM | POA: Diagnosis not present

## 2019-10-03 DIAGNOSIS — E1122 Type 2 diabetes mellitus with diabetic chronic kidney disease: Secondary | ICD-10-CM | POA: Diagnosis not present

## 2019-10-03 DIAGNOSIS — Z79899 Other long term (current) drug therapy: Secondary | ICD-10-CM | POA: Insufficient documentation

## 2019-10-03 DIAGNOSIS — E039 Hypothyroidism, unspecified: Secondary | ICD-10-CM | POA: Insufficient documentation

## 2019-10-03 DIAGNOSIS — M79672 Pain in left foot: Secondary | ICD-10-CM | POA: Diagnosis not present

## 2019-10-03 DIAGNOSIS — I130 Hypertensive heart and chronic kidney disease with heart failure and stage 1 through stage 4 chronic kidney disease, or unspecified chronic kidney disease: Secondary | ICD-10-CM | POA: Insufficient documentation

## 2019-10-03 DIAGNOSIS — I2510 Atherosclerotic heart disease of native coronary artery without angina pectoris: Secondary | ICD-10-CM | POA: Insufficient documentation

## 2019-10-03 LAB — COMPREHENSIVE METABOLIC PANEL
ALT: 16 U/L (ref 0–44)
AST: 23 U/L (ref 15–41)
Albumin: 4.4 g/dL (ref 3.5–5.0)
Alkaline Phosphatase: 55 U/L (ref 38–126)
Anion gap: 12 (ref 5–15)
BUN: 24 mg/dL — ABNORMAL HIGH (ref 8–23)
CO2: 24 mmol/L (ref 22–32)
Calcium: 9 mg/dL (ref 8.9–10.3)
Chloride: 100 mmol/L (ref 98–111)
Creatinine, Ser: 1.1 mg/dL — ABNORMAL HIGH (ref 0.44–1.00)
GFR calc Af Amer: 57 mL/min — ABNORMAL LOW (ref >60)
GFR calc non Af Amer: 49 mL/min — ABNORMAL LOW (ref >60)
Glucose, Bld: 146 mg/dL — ABNORMAL HIGH (ref 70–99)
Potassium: 4.2 mmol/L (ref 3.5–5.1)
Sodium: 136 mmol/L (ref 135–145)
Total Bilirubin: 0.6 mg/dL (ref 0.3–1.2)
Total Protein: 7.6 g/dL (ref 6.5–8.1)

## 2019-10-03 LAB — CBC WITH DIFFERENTIAL/PLATELET
Abs Immature Granulocytes: 0.02 10*3/uL (ref 0.00–0.07)
Basophils Absolute: 0.1 10*3/uL (ref 0.0–0.1)
Basophils Relative: 1 %
Eosinophils Absolute: 0.2 10*3/uL (ref 0.0–0.5)
Eosinophils Relative: 2 %
HCT: 43.3 % (ref 36.0–46.0)
Hemoglobin: 14.2 g/dL (ref 12.0–15.0)
Immature Granulocytes: 0 %
Lymphocytes Relative: 24 %
Lymphs Abs: 1.9 10*3/uL (ref 0.7–4.0)
MCH: 30.5 pg (ref 26.0–34.0)
MCHC: 32.8 g/dL (ref 30.0–36.0)
MCV: 92.9 fL (ref 80.0–100.0)
Monocytes Absolute: 0.6 10*3/uL (ref 0.1–1.0)
Monocytes Relative: 7 %
Neutro Abs: 5.2 10*3/uL (ref 1.7–7.7)
Neutrophils Relative %: 66 %
Platelets: 313 10*3/uL (ref 150–400)
RBC: 4.66 MIL/uL (ref 3.87–5.11)
RDW: 13.3 % (ref 11.5–15.5)
WBC: 7.9 10*3/uL (ref 4.0–10.5)
nRBC: 0 % (ref 0.0–0.2)

## 2019-10-03 IMAGING — US US EXTREM LOW VENOUS*L*
1 series · 13 of 24 positions shown · non-contrast
Comparison: None.

CLINICAL DATA: Left lower extremity pain and edema

EXAM:
LEFT LOWER EXTREMITY VENOUS DUPLEX ULTRASOUND
TECHNIQUE: Gray-scale sonography with graded compression, as well as color
Doppler and duplex ultrasound were performed to evaluate the left
lower extremity deep venous system from the level of the common
femoral vein and including the common femoral, femoral, profunda
femoral, popliteal and calf veins including the posterior tibial,
peroneal and gastrocnemius veins when visible. The superficial great
saphenous vein was also interrogated. Spectral Doppler was utilized
to evaluate flow at rest and with distal augmentation maneuvers in
the common femoral, femoral and popliteal veins.

[Series 1: us venous img lower uni left (dvt) · portal-venous · 13 of 49 slices shown]
[im 1/49]
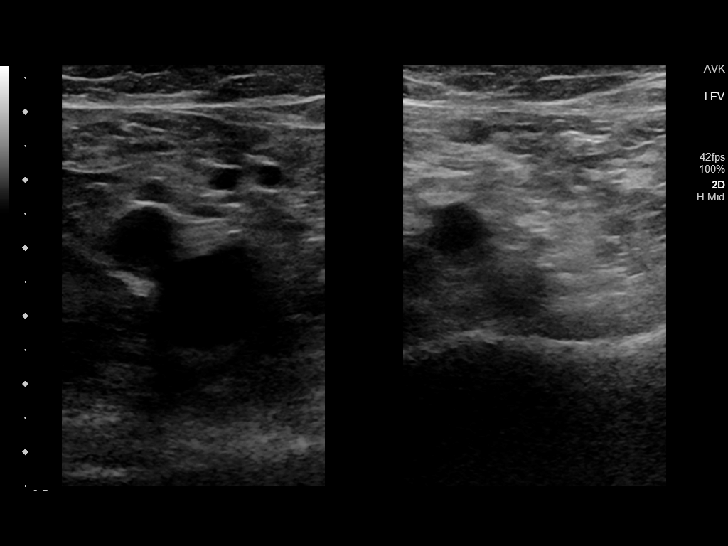
[im 5/49]
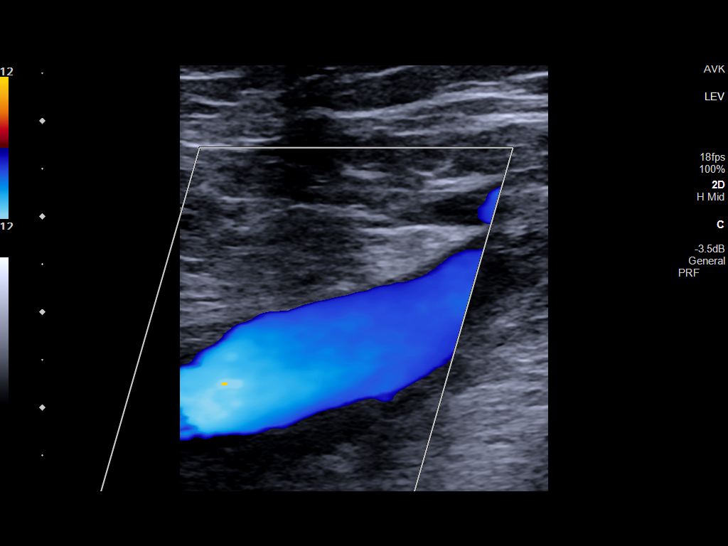
[im 9/49]
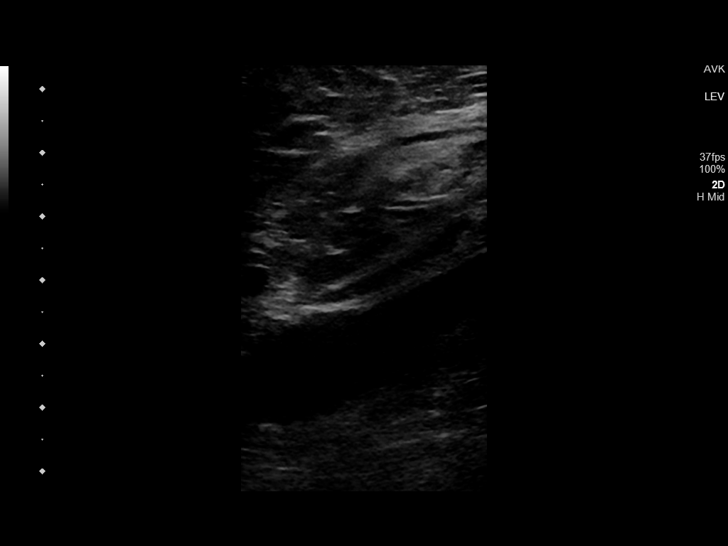
[im 13/49]
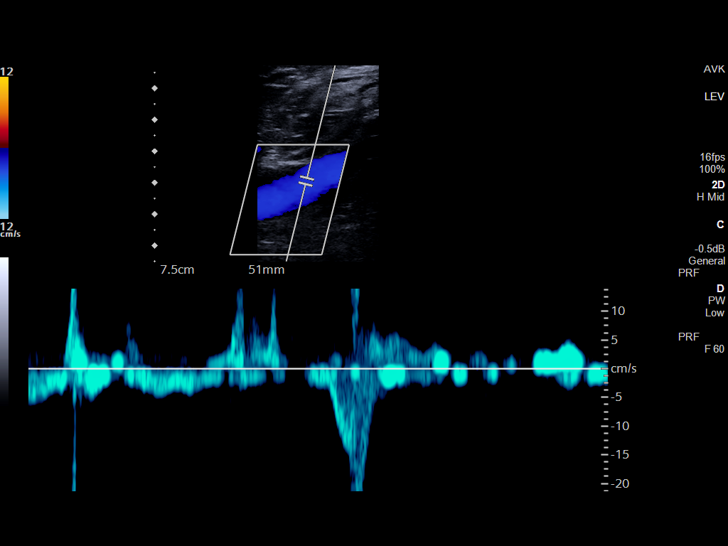
[im 17/49]
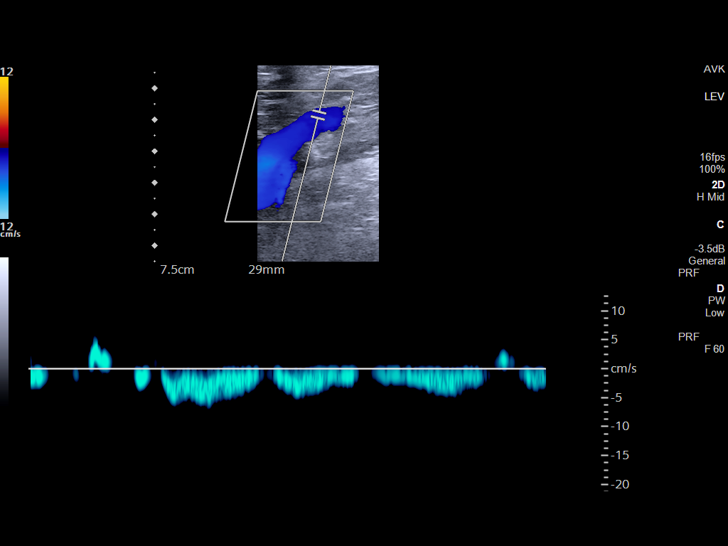
[im 21/49]
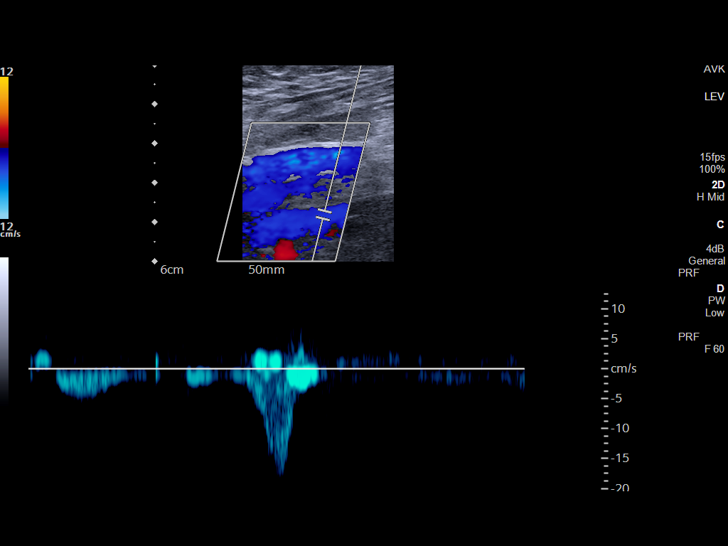
[im 26/49]
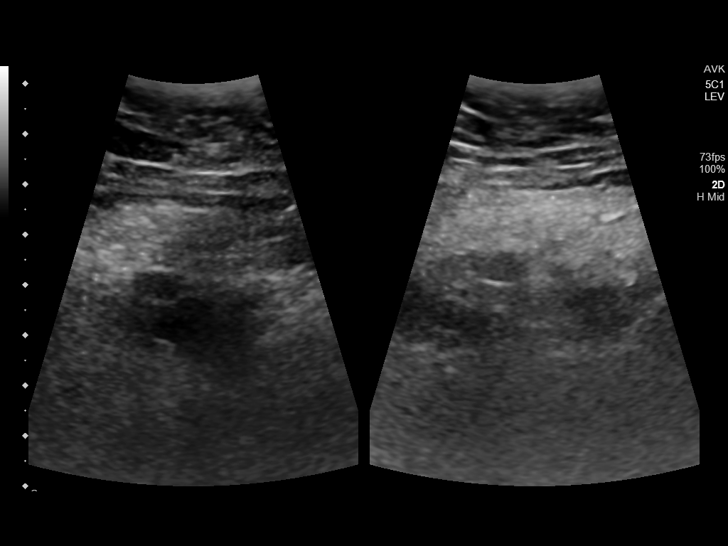
[im 28/49]
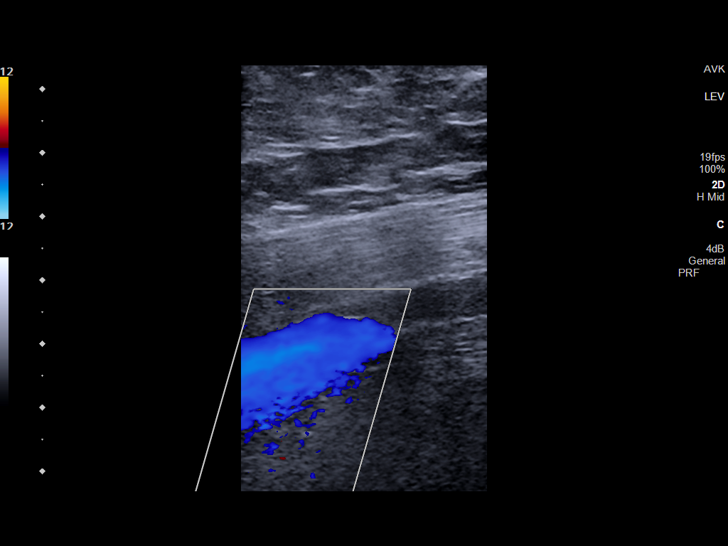
[im 32/49]
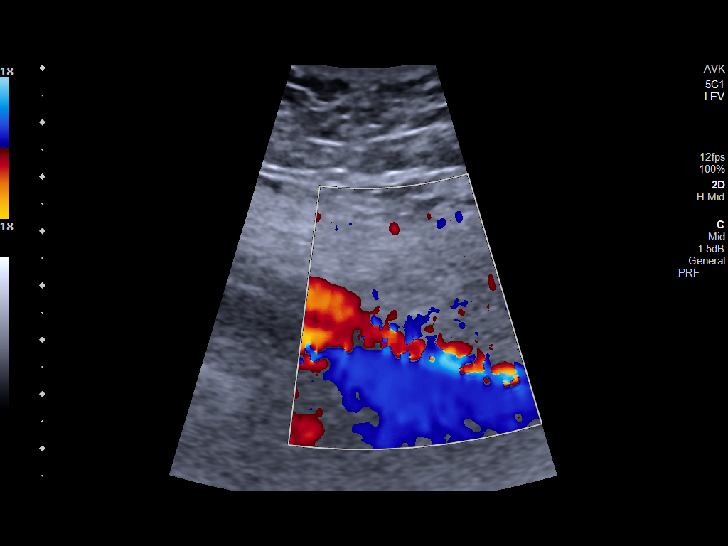
[im 36/49]
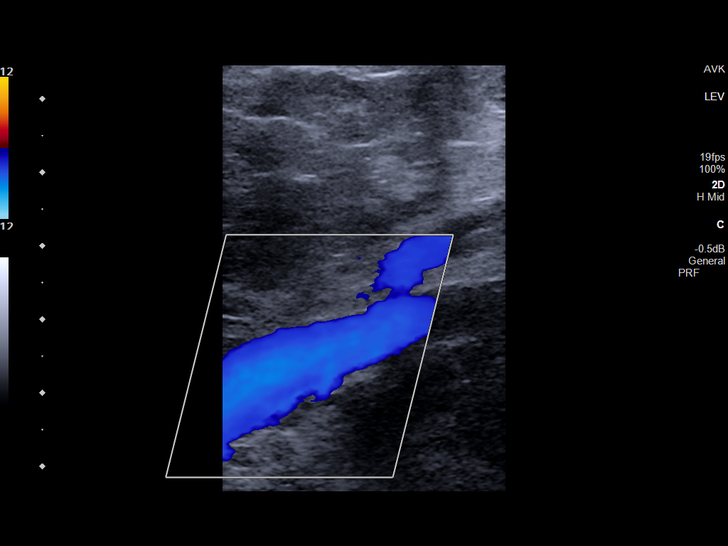
[im 40/49]
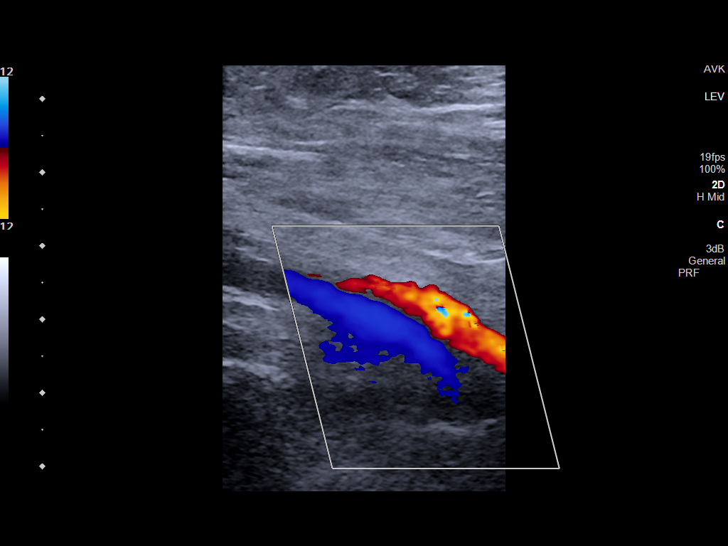
[im 44/49]
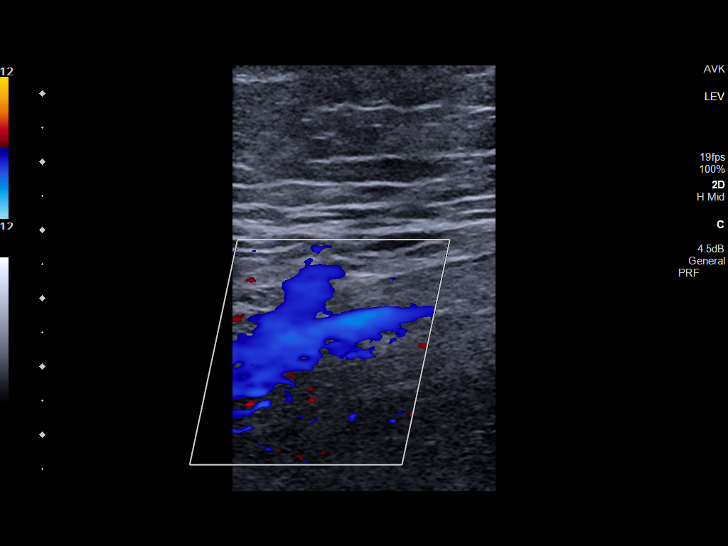
[im 49/49]
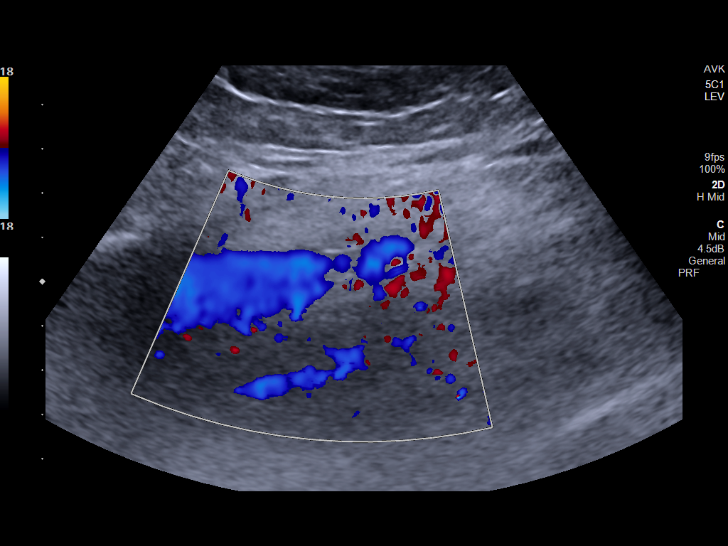

[13 of 24 positions shown; findings below may reference images not displayed]

FINDINGS: Contralateral Common Femoral Vein: Respiratory phasicity is normal
and symmetric with the symptomatic side. No evidence of thrombus.
Normal compressibility.

Common Femoral Vein: No evidence of thrombus. Normal
compressibility, respiratory phasicity and response to augmentation.

Saphenofemoral Junction: No evidence of thrombus. Normal
compressibility and flow on color Doppler imaging.

Profunda Femoral Vein: No evidence of thrombus. Normal
compressibility and flow on color Doppler imaging.

Femoral Vein: No evidence of thrombus. Normal compressibility,
respiratory phasicity and response to augmentation.

Popliteal Vein: No evidence of thrombus. Normal compressibility,
respiratory phasicity and response to augmentation.

Calf Veins: No evidence of thrombus. Normal compressibility and flow
on color Doppler imaging.

Superficial Great Saphenous Vein: No evidence of thrombus. Normal
compressibility.

Venous Reflux:  None.

Other Findings:  None.
IMPRESSION: No evidence of deep venous thrombosis in the left lower extremity.
Right common femoral vein also patent.

## 2019-10-03 IMAGING — CR DG FOOT COMPLETE 3+V*L*
1 series · 3 of 3 positions shown · non-contrast
Comparison: None

CLINICAL DATA: Foot pain over week ago, walking down steps in turn
foot, continues to have pain

EXAM:
LEFT FOOT - COMPLETE 3+ VIEW

[Series 1: x foot ap left · 0.14mm/px · 3 of 3 slices shown]
[im 1/3]
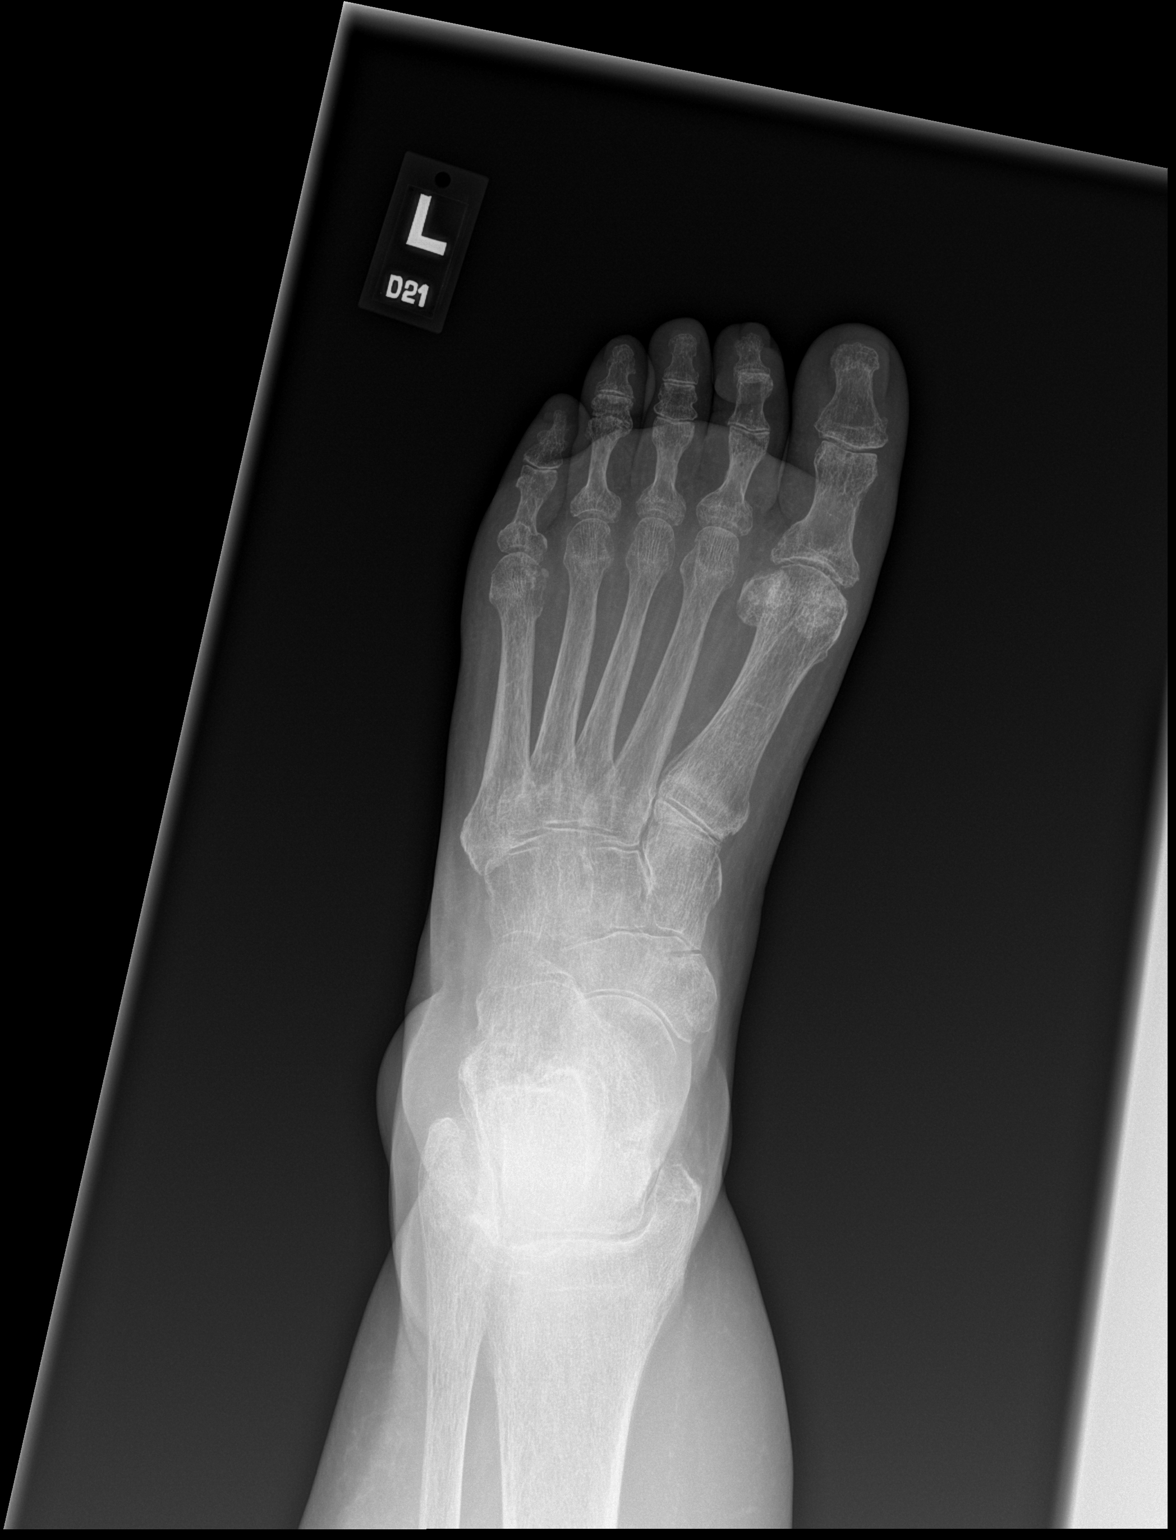
[im 2/3]
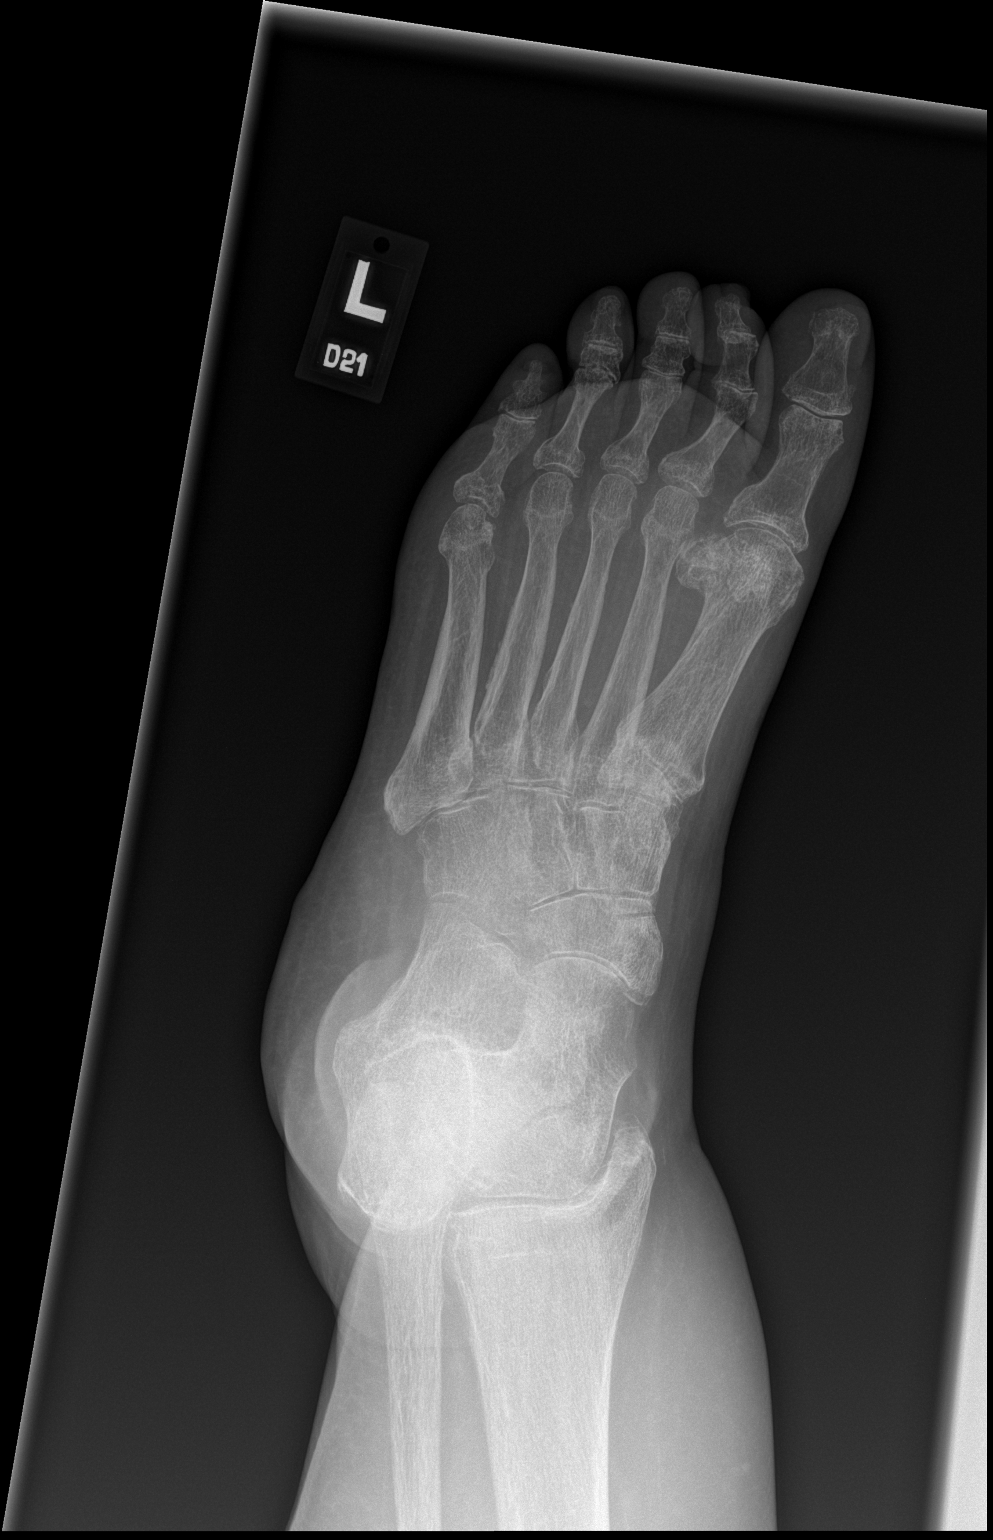
[im 3/3]
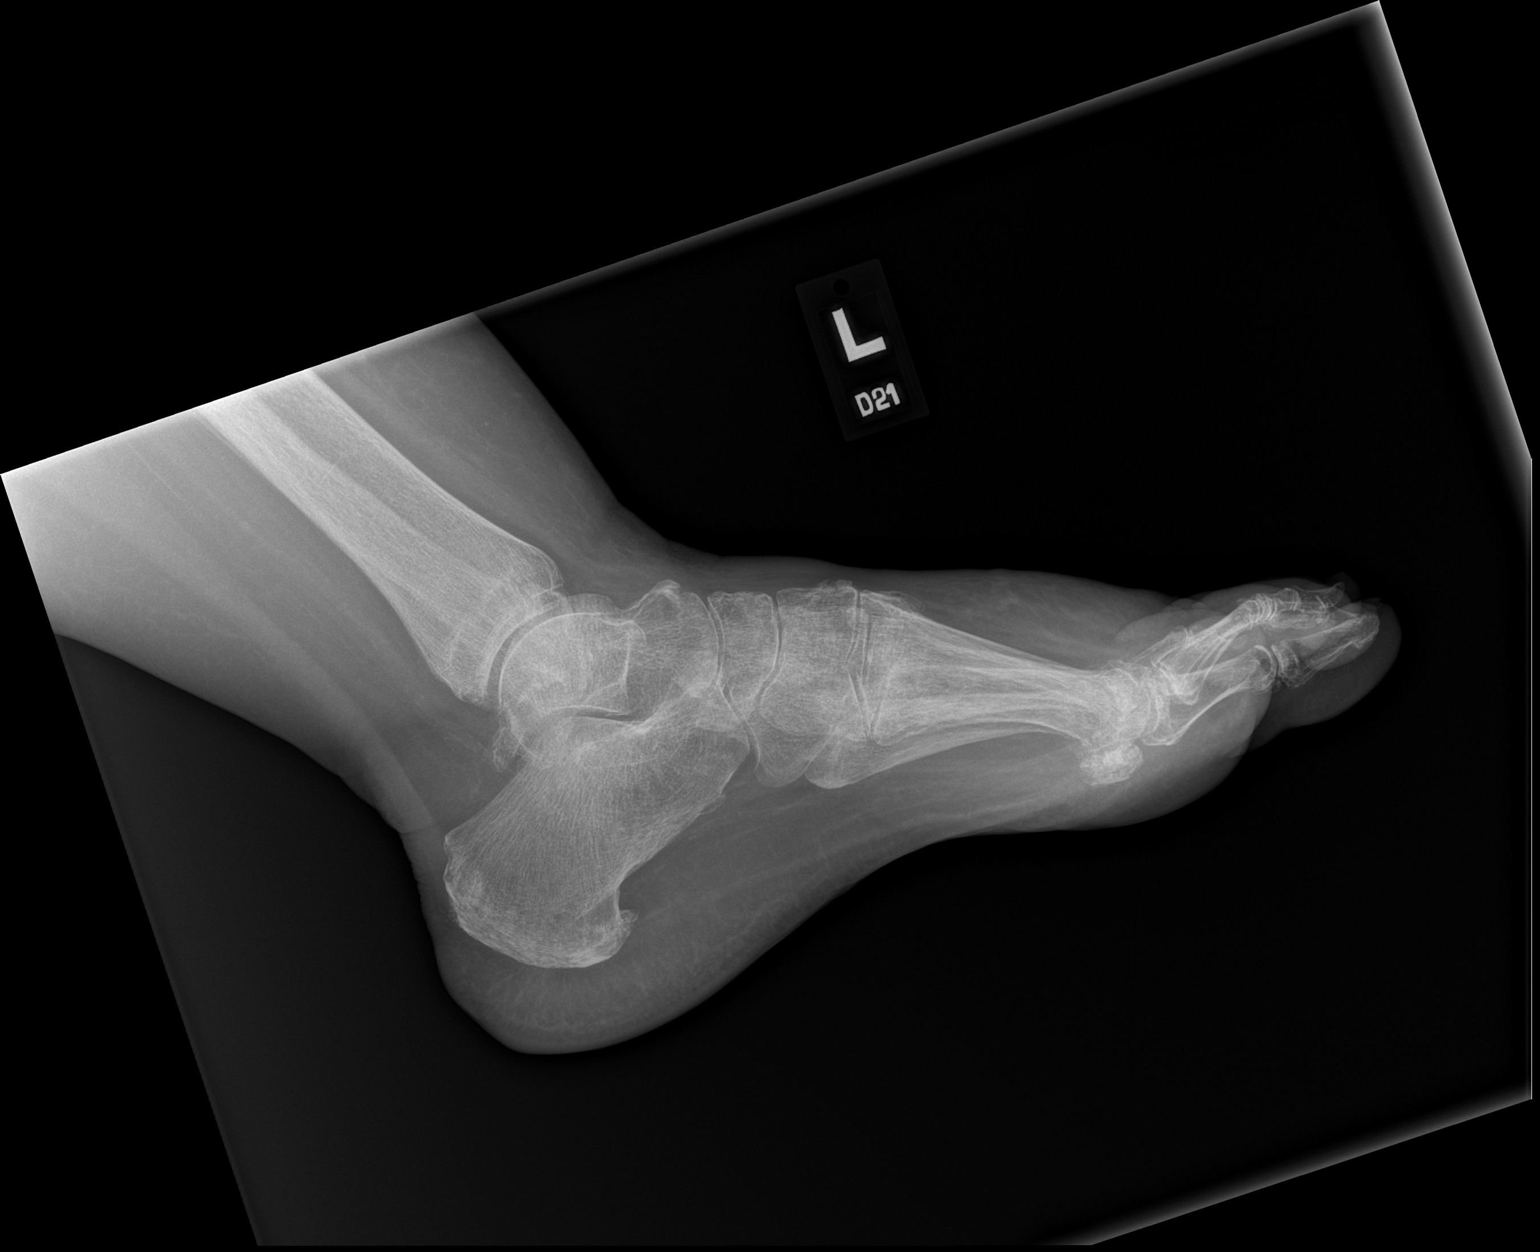

[3 of 3 positions shown; findings below may reference images not displayed]

FINDINGS: Osteopenia. Midfoot degenerative changes and degenerative changes
throughout the foot. Plantar enthesopathy. No radiopaque foreign
body.
IMPRESSION: Osteopenia with degenerative changes.  No acute abnormality.

## 2019-10-03 NOTE — Discharge Instructions (Addendum)
Please call Dr. [place] office tomorrow and schedule a follow-up appointment.

## 2019-10-03 NOTE — ED Provider Notes (Signed)
[facility] Emergency Department Provider Note  ____________________________________________   First MD Initiated Contact with Patient 10/03/19 1442     (approximate)  I have reviewed the triage vital signs and the nursing notes.   HISTORY  Chief Complaint Foot Injury and Foot Pain  HPI Laura [name] is a 74 y.o. female presents to the emergency department for evaluation of left foot pain.  The patient states that she has had increasing pain in the left foot for a few weeks after stepping awkwardly going up the ramp.  She states that she has had a drag or weakness in the left leg for a few months that she feels like contributed to this particular injury.  The patient noted that there was worsening of redness and swelling of the foot today prompting her to come to the emergency room for evaluation.  Of note, the patient has a history of spinal stenosis and RA and is currently receiving Remicade infusion therapy.  He is followed by Dr. Gavin Potters for rheumatology.      Past Medical History:  Diagnosis Date  . Allergy   . Anxiety   . Arthritis, degenerative 10/08/2013   Overview:    a.  Lumbar spine/spinal stenosis/foot drop.   b.  Hands.   . Asthma   . Chronic kidney disease   . Depression   . 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 Major depression, single episode, in complete remission (HCC) 06/25/2015  . Memory loss, short term 03/19/2014  . Seizure (HCC) 10/07/2014  . Sleep apnea   . Sleep apnea   . Thyroid disease     Patient Active Problem List   Diagnosis Date Noted  . MDD (major depressive disorder), recurrent, in full remission (HCC) 11/22/2018  . Paroxysmal atrial fibrillation (HCC) 10/16/2018  . Coronary artery disease involving native coronary artery of native heart without angina pectoris 10/15/2018  . 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 Pneumonia 10/06/2018  . Immunosuppression (HCC)  09/18/2018  . Pharmacologic therapy 07/11/2018  . Disorder of skeletal system 07/11/2018  . Problems influencing health status 07/11/2018  . MDD (major depressive disorder), recurrent episode, mild (HCC) 07/09/2018  . Primary insomnia 07/09/2018  . Bereavement 07/09/2018  . Body mass index (BMI) of 40.0-44.9 in adult (HCC) 10/09/2017  . Postmenopausal 04/06/2017  . Chronic arthralgias of knees and hips (Right) 08/18/2016  . Arthritis 07/11/2016  . Dyspnea on exertion 07/11/2016  . Kidney disease 07/11/2016  . 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 Snoring 07/11/2016  . Morbid obesity with BMI of 40.0-44.9, adult (HCC) 07/11/2016  . Opioid-induced constipation (OIC) 04/21/2016  . Osteoarthritis of knee (Right) 01/25/2016  . Diet-controlled diabetes mellitus (HCC) 10/03/2015  . GAD (generalized anxiety disorder) 09/24/2015  . Disturbance of skin sensation 07/15/2015  . Elevated sedimentation rate 07/15/2015  . Elevated C-reactive protein (CRP) 07/15/2015  . Chronic hip pain (Left) 04/06/2015  . History of TKR (total knee replacement) (Left) 02/09/2015  . History of femur fracture (Right) 02/09/2015  . 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 Lumbar foraminal stenosis (Bilateral L3-4 and L5-S1) 02/09/2015  . Chronic low back pain (1ry area of Pain) (Bilateral) (L>R) 01/22/2015  . Chronic pain syndrome 11/25/2014  . Uncomplicated opioid dependence (HCC) 11/07/2014  . Lumbar spinal stenosis (5 mm Severe L3-4; 8 mm L4-5) w/ neurogenic claudication 11/07/2014  . Lumbar spondylosis 11/07/2014  . Lumbar facet syndrome (Bilateral) (L>R) 11/07/2014  .  Chronic knee pain (Right) 11/05/2014  . Opiate use (30 MME/Day) 11/05/2014  . Long term current use of opiate analgesic 11/05/2014  . Long term prescription opiate use 11/05/2014  . Encounter for therapeutic drug level monitoring 11/05/2014  . Morbid obesity (HCC)  10/27/2014  . Extreme obesity (HCC) 10/07/2014  . Type 2 diabetes mellitus (HCC) 03/19/2014  . Depressive disorder 10/14/2013  . Essential (primary) hypertension 10/14/2013  . Insomnia secondary to chronic pain 07/09/2013  . Anxiety 07/09/2013  . GERD (gastroesophageal reflux disease) 01/18/2013  . Hypothyroidism 01/18/2013  . Hypercholesterolemia 01/18/2013  . 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 CKD (chronic kidney disease) stage 3, GFR 30-59 ml/min (HCC) 01/17/2013  . Congestive heart failure with left ventricular systolic dysfunction (HCC) 11/10/2012    Past Surgical History:  Procedure Laterality Date  . ABDOMINAL HYSTERECTOMY    . CESAREAN SECTION    . HIP SURGERY Right   . REPLACEMENT TOTAL KNEE Left     Prior to Admission medications   Medication Sig Start Date End Date Taking? Authorizing Provider  albuterol (VENTOLIN HFA) 108 (90 Base) MCG/ACT inhaler Inhale 2 puffs into the lungs every 6 (six) hours as needed for wheezing or shortness of breath. 04/30/19   Johnna Acosta, NP  aspirin EC 81 MG tablet Take by mouth.    [provider]  azelastine (ASTELIN) 0.1 % nasal spray Place into the nose. 02/26/19   [provider]  chlorthalidone (HYGROTON) 25 MG tablet TAKE 1/2 TABLET (12.5MG ) BY MOUTH EVERY DAY 04/06/16   [provider]  Cholecalciferol (VITAMIN D) 2000 UNITS tablet Take by mouth daily.     [provider]  DULoxetine (CYMBALTA) 60 MG capsule Take 1 capsule (60 mg total) by mouth daily. 08/09/19   Jomarie Longs, MD  folic acid (FOLVITE) 1 MG tablet Take 1 mg by mouth daily.    [provider]  hydroxychloroquine (PLAQUENIL) 200 MG tablet Take 100 mg by mouth daily.     [provider]  hydrOXYzine (VISTARIL) 25 MG capsule Take 1-2 capsules (25-50 mg total) by mouth at bedtime as needed. For sleep 09/27/19   Jomarie Longs, MD  inFLIXimab (REMICADE) 100 MG injection Inject 100 mg into the vein  every 8 (eight) weeks.    [provider]  levothyroxine (SYNTHROID) 50 MCG tablet Take 50 mcg by mouth at bedtime.  07/09/18   [provider]  levothyroxine (SYNTHROID, LEVOTHROID) 200 MCG tablet TAKE 1 TABLET ONCE DAILY- ON AN EMPTY STOMACH WITH A GLASS OF WATER 30-60 MINUTES BEFORE BREAKFAST 09/09/14   [provider]  loratadine (CLARITIN) 10 MG tablet Take 10 mg by mouth daily.  04/05/14   [provider]  methotrexate (RHEUMATREX) 2.5 MG tablet TAKE 4 TABLETS (10 MG TOTAL) BY MOUTH EVERY 7 (SEVEN) DAYS WITH A MEAL 03/15/16   [provider]  metoprolol succinate (TOPROL-XL) 100 MG 24 hr tablet Take 1 tablet (100 mg total) by mouth daily. Take with or immediately following a meal. 10/10/18   Mayo, Allyn Kenner, MD  Multiple Vitamin (MULTI-VITAMINS) TABS Take by mouth.    [provider]  oxyCODONE (OXY IR/ROXICODONE) 5 MG immediate release tablet Take 1 tablet (5 mg total) by mouth every 6 (six) hours as needed for severe pain. Must last 30 days 07/13/19 08/12/19  Delano Metz, MD  oxyCODONE (OXY IR/ROXICODONE) 5 MG immediate release tablet Take 1 tablet (5 mg total) by mouth every 6 (six) hours as needed for severe pain.  Must last 30 days 08/12/19 09/11/19  Delano Metz, MD  oxyCODONE (OXY IR/ROXICODONE) 5 MG immediate release tablet Take 1 tablet (5 mg total) by mouth every 6 (six) hours as needed for severe pain. Must last 30 days 09/11/19 10/11/19  Delano Metz, MD  rosuvastatin (CRESTOR) 5 MG tablet Take 5 mg by mouth 2 (two) times a week. 12/23/18   [provider]  spironolactone (ALDACTONE) 25 MG tablet TAKE 1/2 (ONE-HALF) TABLET BY MOUTH DAILY 09/12/14   [provider]  valACYclovir (VALTREX) 1000 MG tablet PLEASE SEE ATTACHED FOR DETAILED DIRECTIONS 10/12/18   [provider]    Allergies Bupropion and Penicillins  Family History  Problem Relation Age of Onset  . Hypertension Mother   . Stroke Mother    . Heart attack Father   . Alcohol abuse Father   . Depression Father   . Post-traumatic stress disorder Father   . Rheum arthritis Sister   . Hypertension Sister   . Depression Sister   . Breast cancer Neg Hx     Social History Social History   Tobacco Use  . Smoking status: Never Smoker  . Smokeless tobacco: Never Used  Vaping Use  . Vaping Use: Never used  Substance Use Topics  . Alcohol use: No    Alcohol/week: 0.0 standard drinks  . Drug use: No    Review of Systems Constitutional: No fever/chills Eyes: No visual changes. ENT: No sore throat. Cardiovascular: Denies chest pain. Respiratory: Denies shortness of breath. Gastrointestinal: No abdominal pain.  No nausea, no vomiting.  No diarrhea.  No constipation. Genitourinary: Negative for dysuria. Musculoskeletal: Negative for back pain. + Left leg swelling,+ left foot pain Skin: + Rash bilateral feet,+ redness left foot Neurological: Negative for headaches, focal weakness or numbness. ____________________________________________   PHYSICAL EXAM:  VITAL SIGNS: ED Triage Vitals  Enc Vitals Group     BP 10/03/19 1252 (!) 126/57     Pulse Rate 10/03/19 1252 75     Resp 10/03/19 1252 14     Temp 10/03/19 1252 98.4 F (36.9 C)     Temp Source 10/03/19 1252 Oral     SpO2 10/03/19 1252 100 %     Weight 10/03/19 1246 270 lb (122.5 kg)     Height 10/03/19 1246 5\' 4"  (1.626 m)     Head Circumference --      Peak Flow --      Pain Score 10/03/19 1246 6     Pain Loc --      Pain Edu? --      Excl. in GC? --    Constitutional: Alert and oriented. Well appearing and in no acute distress. Eyes: Conjunctivae are normal.  Head: Atraumatic. Nose: No congestion/rhinnorhea. Neck: No stridor.   Cardiovascular: Normal rate, regular rhythm. Grossly normal heart sounds.  Bilateral lower extremity pulses identified. Respiratory: Normal respiratory effort.  No retractions. Lungs CTAB. Gastrointestinal: Soft and nontender.  No distention. No abdominal bruits. No CVA tenderness. Musculoskeletal: There is a moderate amount of swelling of the dorsum of the left.  There is associated redness about the distal foot.  She has no tenderness into the left calf by a does appear larger than the contralateral side.  There is a petechial rash to the distal bilateral feet, worse on the left than right.  The patient has 3/5 strength of left ankle dorsiflexion, but 5/5 strength of hip flexion, knee flexion, knee extension, plantar flexion.  There is no other weakness of the  left side. Neurologic:  Normal speech and language.  Weakness of the left ankle in dorsiflexion only with 5/5 strength in all other planes of movement of the left lower extremity.  No gait instability. Skin:  Skin is warm, dry and intact.  Petechial rash as described above. Psychiatric: Mood and affect are normal. Speech and behavior are normal.  ____________________________________________   LABS (all labs ordered are listed, but only abnormal results are displayed)  Labs Reviewed  COMPREHENSIVE METABOLIC PANEL - Abnormal; Notable for the following components:      Result Value   Glucose, Bld 146 (*)    BUN 24 (*)    Creatinine, Ser 1.10 (*)    GFR calc non Af Amer 49 (*)    GFR calc Af Amer 57 (*)    All other components within normal limits  CBC WITH DIFFERENTIAL/PLATELET   ____________________________________________  RADIOLOGY  Official radiology report(s): US Venous Img Lower Unilateral Left  Result Date: 10/03/2019 CLINICAL DATA:  Left lower extremity pain and edema EXAM: LEFT LOWER EXTREMITY VENOUS DUPLEX ULTRASOUND TECHNIQUE: Gray-scale sonography with graded compression, as well as color Doppler and duplex ultrasound were performed to evaluate the left lower extremity deep venous system from the level of the common femoral vein and including the common femoral, femoral, profunda femoral, popliteal and calf veins including the posterior tibial,  peroneal and gastrocnemius veins when visible. The superficial great saphenous vein was also interrogated. Spectral Doppler was utilized to evaluate flow at rest and with distal augmentation maneuvers in the common femoral, femoral and popliteal veins. COMPARISON:  None. FINDINGS: Contralateral Common Femoral Vein: Respiratory phasicity is normal and symmetric with the symptomatic side. No evidence of thrombus. Normal compressibility. Common Femoral Vein: No evidence of thrombus. Normal compressibility, respiratory phasicity and response to augmentation. Saphenofemoral Junction: No evidence of thrombus. Normal compressibility and flow on color Doppler imaging. Profunda Femoral Vein: No evidence of thrombus. Normal compressibility and flow on color Doppler imaging. Femoral Vein: No evidence of thrombus. Normal compressibility, respiratory phasicity and response to augmentation. Popliteal Vein: No evidence of thrombus. Normal compressibility, respiratory phasicity and response to augmentation. Calf Veins: No evidence of thrombus. Normal compressibility and flow on color Doppler imaging. Superficial Great Saphenous Vein: No evidence of thrombus. Normal compressibility. Venous Reflux:  None. Other Findings:  None. IMPRESSION: No evidence of deep venous thrombosis in the left lower extremity. Right common femoral vein also patent. Electronically Signed   By: Bretta Bang III M.D.   On: 10/03/2019 16:07   DG Foot Complete Left  Result Date: 10/03/2019 CLINICAL DATA:  Foot pain over week ago, walking down steps in turn foot, continues to have pain EXAM: LEFT FOOT - COMPLETE 3+ VIEW COMPARISON:  None FINDINGS: Osteopenia. Midfoot degenerative changes and degenerative changes throughout the foot. Plantar enthesopathy. No radiopaque foreign body. IMPRESSION: Osteopenia with degenerative changes.  No acute abnormality. Electronically Signed   By: Donzetta Kohut M.D.   On: 10/03/2019 13:44     ____________________________________________   INITIAL IMPRESSION / ASSESSMENT AND PLAN / ED COURSE  As part of my medical decision making, I reviewed the following data within the electronic MEDICAL RECORD NUMBER Nursing notes reviewed and incorporated, A consult was requested and obtained from this/these consultant(s) Rheumatology, Evaluated by EM attending Dr. Antoine Primas, and Notes from prior ED visits        Jaylen Appler is a 74 year old female who presents to the emergency department for evaluation of left foot pain and swelling and weakness.  The case was discussed with Dr. Katrinka Blazing who also evaluated the patient in person.  Physical exam is concerning for weakness in dorsiflexion of the left ankle but full strength in all other movements of the left lower extremity.  There is also noted swelling of the left lower extremity as well as petechial rash to the bilateral feet.  X-rays of the left foot are negative and ultrasound evaluation did not demonstrate any DVT of the left lower extremity.  Differentials for the patient include but are not limited to peroneal nerve palsy, platelet dysfunction, side effect of Remicade, the infection.  For this reason, the labs will be performed to evaluate.  CMP is grossly normal except for some mild kidney dysfunction that is known in the patient.  CBC is normal with white blood cell of 7.9 and platelets 313.  After discussion again with Dr. Katrinka Blazing, rheumatology was consulted.  The case was discussed with Dr. Gavin Potters, who is in agreement for close outpatient follow-up for the patient.  Discussed this with the patient and her son in the room and they are amenable with this plan.  The patient will call tomorrow to schedule outpatient rheumatology follow-up.      ____________________________________________   FINAL CLINICAL IMPRESSION(S) / ED DIAGNOSES  Final diagnoses:  Foot pain, left  Petechial rash     ED Discharge Orders    None       *Please note:  Liliani Bobo was evaluated in Emergency Department on 10/03/2019 for the symptoms described in the history of present illness. She was evaluated in the context of the global COVID-19 pandemic, which necessitated consideration that the patient might be at risk for infection with the SARS-CoV-2 virus that causes COVID-19. Institutional protocols and algorithms that pertain to the evaluation of patients at risk for COVID-19 are in a state of rapid change based on information released by regulatory bodies including the CDC and federal and state organizations. These policies and algorithms were followed during the patient's care in the ED.  Some ED evaluations and interventions may be delayed as a result of limited staffing during and the pandemic.*   Note:  This document was prepared using Dragon voice recognition software and may include unintentional dictation errors.    Laura Chris, PA 10/03/19 1825    Gilles Chiquito, MD 10/03/19 [employer]

## 2019-10-03 NOTE — ED Triage Notes (Signed)
Pt reports a little over a weeks ago was walking on the steps and turned her left foot. Pt reports continues to have pain to her foot.

## 2019-10-07 ENCOUNTER — Encounter: Payer: Medicare Other | Admitting: Pain Medicine

## 2019-10-08 ENCOUNTER — Other Ambulatory Visit: Payer: Self-pay | Admitting: Podiatry

## 2019-10-08 DIAGNOSIS — M21372 Foot drop, left foot: Secondary | ICD-10-CM

## 2019-10-09 ENCOUNTER — Ambulatory Visit
Admission: RE | Admit: 2019-10-09 | Discharge: 2019-10-09 | Disposition: A | Discharge status: Home / Self Care | Payer: Medicare Other | Source: Ambulatory Visit | Attending: Podiatry | Admitting: Podiatry

## 2019-10-09 ENCOUNTER — Other Ambulatory Visit: Payer: Self-pay

## 2019-10-09 DIAGNOSIS — M21372 Foot drop, left foot: Secondary | ICD-10-CM | POA: Diagnosis present

## 2019-10-09 IMAGING — MR MR ANKLE*L* W/O CM
5 series · 40 of 40 positions shown · non-contrast
Comparison: Radiographs [DATE]

CLINICAL DATA: Left foot drop. Ankle swelling. Posterior foot and
ankle pain.

EXAM:
MRI OF THE LEFT ANKLE WITHOUT CONTRAST
TECHNIQUE: Multiplanar, multisequence MR imaging of the ankle was performed. No
intravenous contrast was administered.

[Series 4: PD fat-sat · axial · left · 3.0mm · 0.50mm/px · z∈[-132,+6]mm · 10 of 36 slices shown]
[im 1/36]
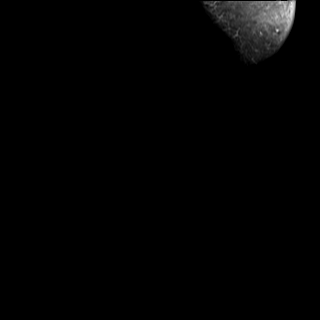
[im 4/36]
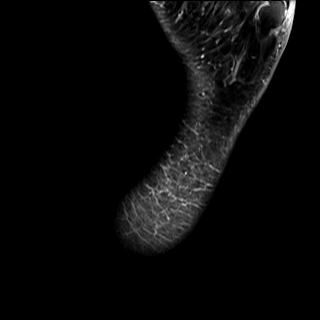
[im 8/36]
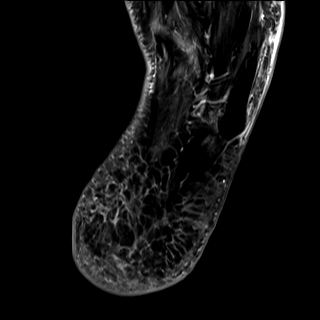
[im 12/36]
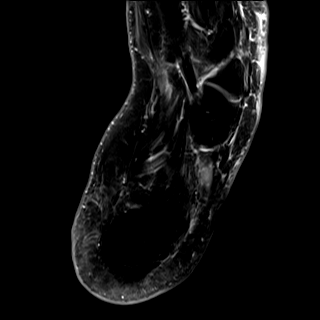
[im 16/36]
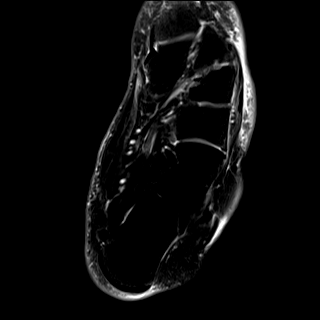
[im 20/36]
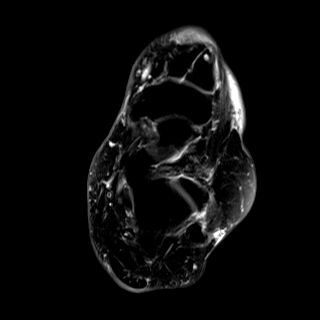
[im 24/36]
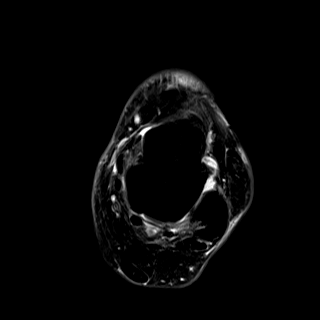
[im 28/36]
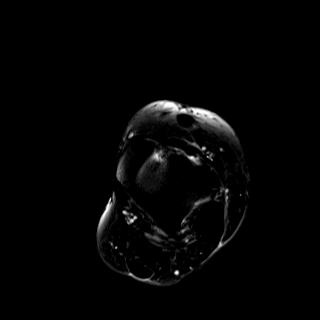
[im 32/36]
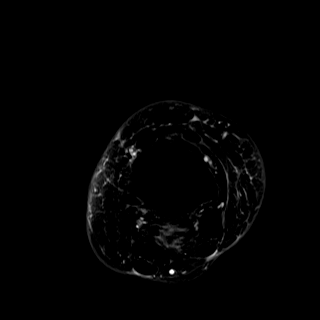
[im 36/36]
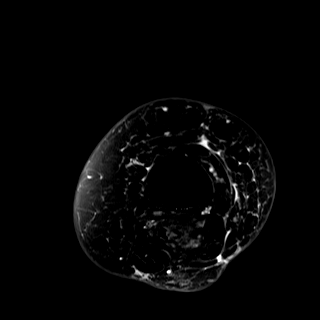

[Series 5: T2 fat-sat · axial · left · 3.0mm · 0.50mm/px · z∈[-132,+6]mm · 10 of 36 slices shown (1 of 2)]
[im 1/36]
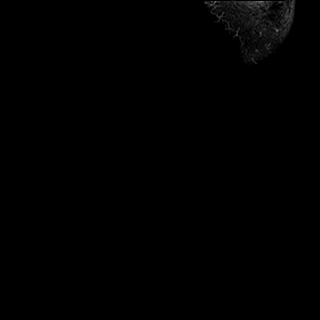
[im 4/36]
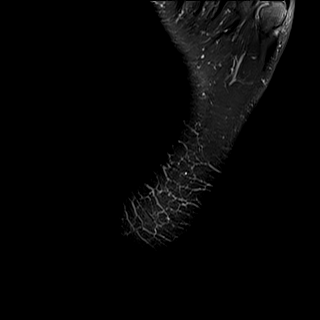
[im 8/36]
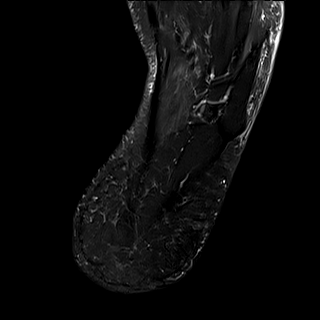
[im 12/36]
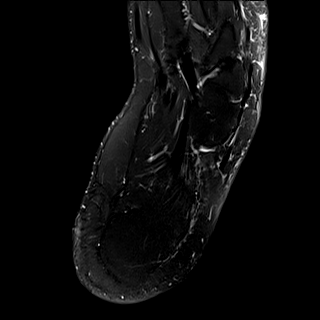
[im 16/36]
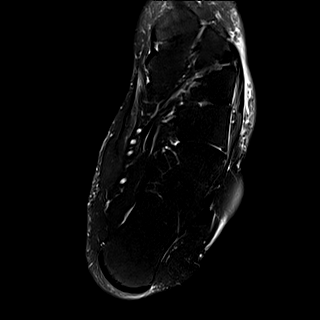
[im 20/36]
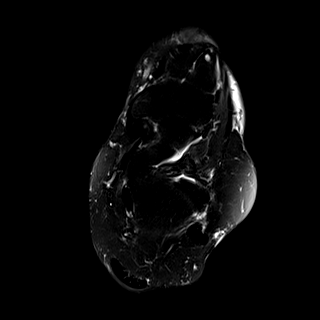
[im 24/36]
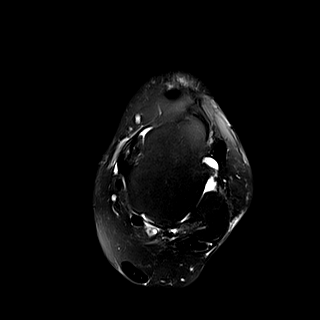
[im 28/36]
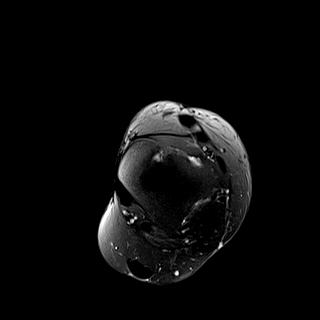
[im 32/36]
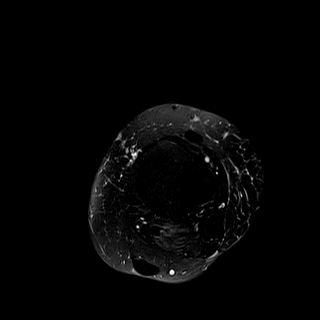
[im 36/36]
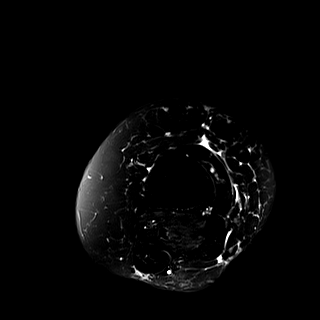

[Series 6: T2 fat-sat · coronal · left · 3.0mm · 0.62mm/px · 10 of 40 slices shown (2 of 2)]
[im 1/40]
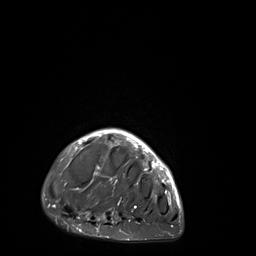
[im 5/40]
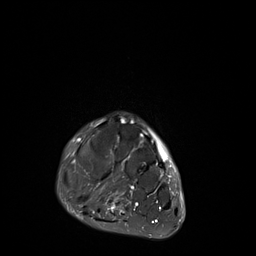
[im 9/40]
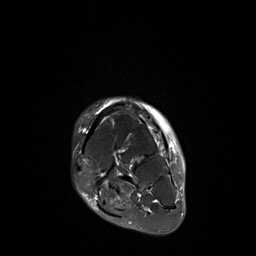
[im 14/40]
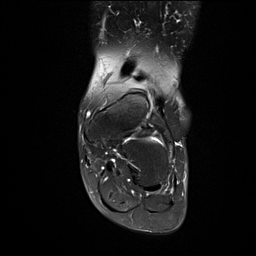
[im 18/40]
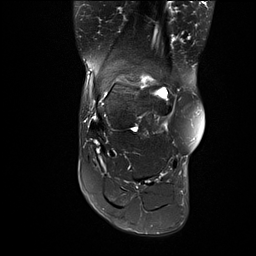
[im 22/40]
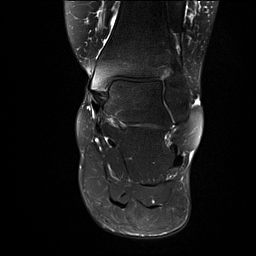
[im 27/40]
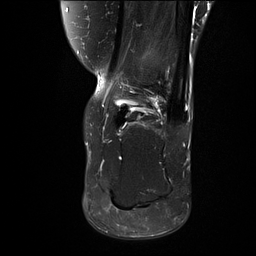
[im 31/40]
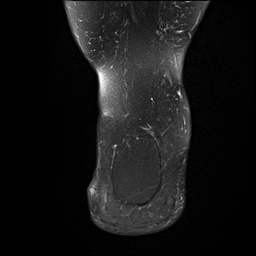
[im 35/40]
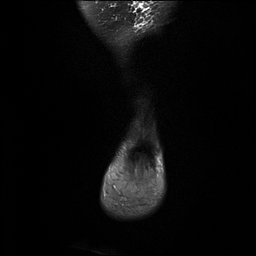
[im 40/40]
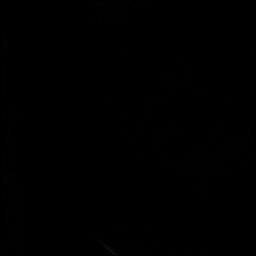

[Series 7: T1 · sagittal · left · 4.0mm · 0.70mm/px · 5 of 21 slices shown]
[im 1/21]
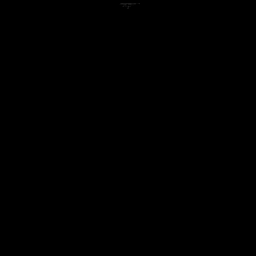
[im 6/21]
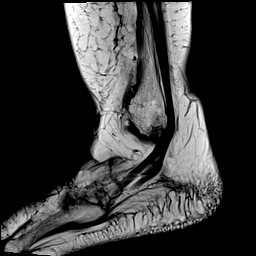
[im 11/21]
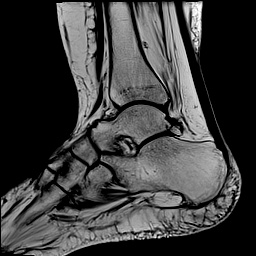
[im 16/21]
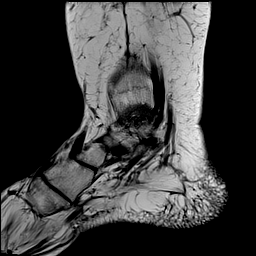
[im 21/21]
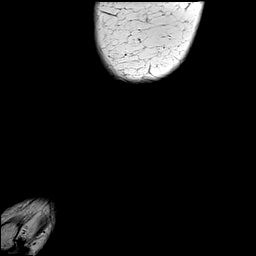

[Series 8: STIR · sagittal · left · 4.0mm · 0.35mm/px · 5 of 21 slices shown]
[im 1/21]
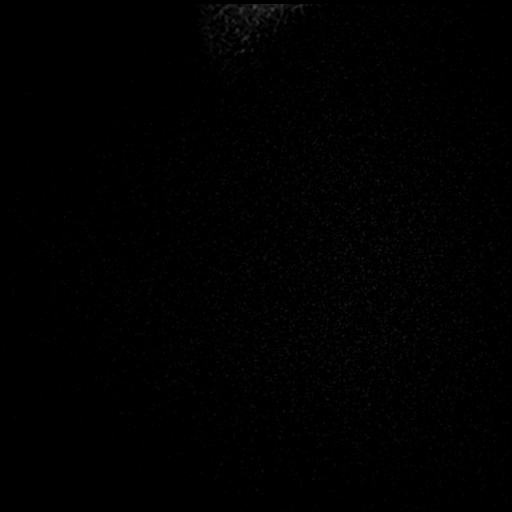
[im 6/21]
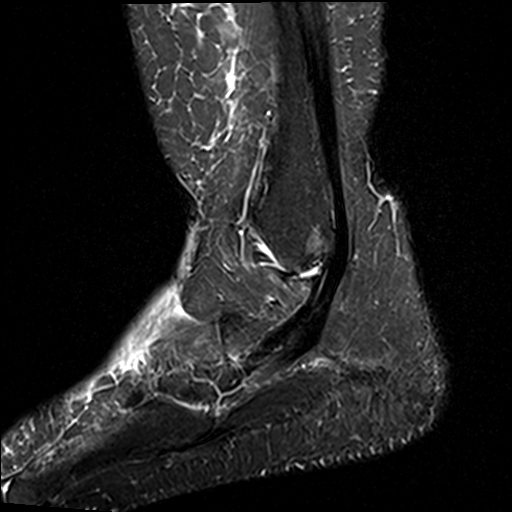
[im 11/21]
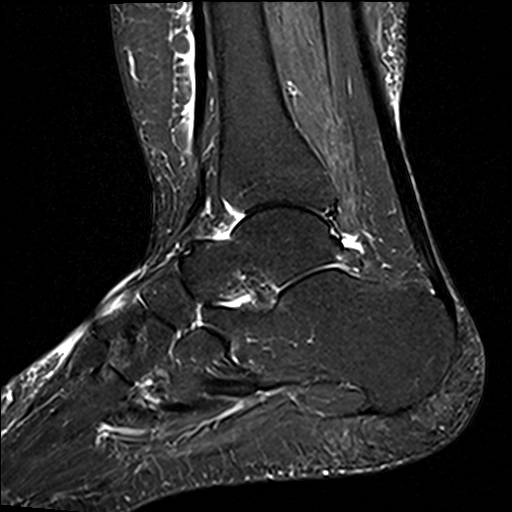
[im 16/21]
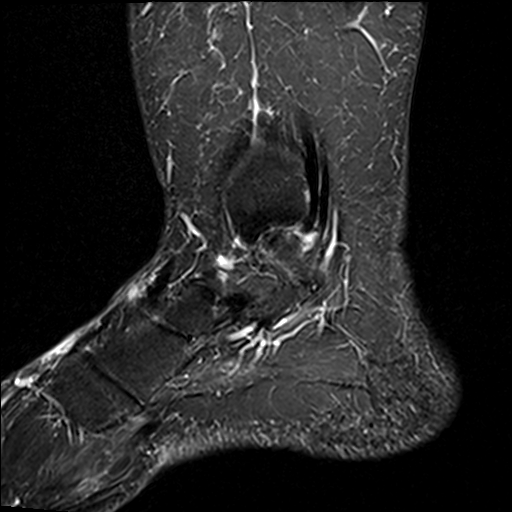
[im 21/21]
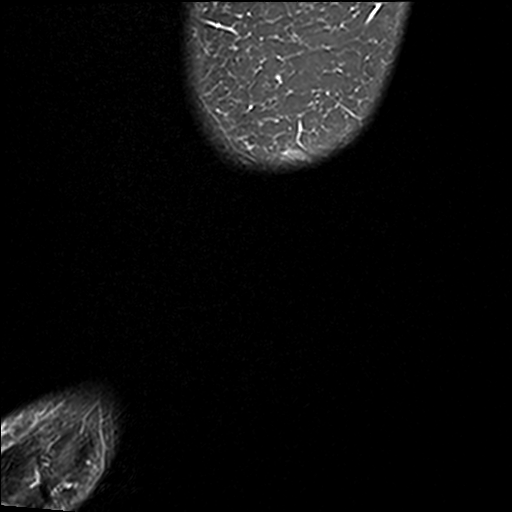

[40 of 40 positions shown; findings below may reference images not displayed]

FINDINGS: TENDONS

Peroneal: Intact.  No significant tendinopathy or tenosynovitis.

Posteromedial: Intact. Mild tenosynovitis involving the posterior
tibialis tendon. No tendinopathy.

Anterior: Intact.  No significant tendinopathy or tenosynovitis.

Achilles: Intact.  Minimal distal tendinopathy.

Plantar Fascia: Intact.  No findings for plantar fasciitis.

LIGAMENTS

Lateral: Intact

Medial: Intact

CARTILAGE

Ankle Joint: Mild degenerative chondrosis with cartilage thinning
and mild joint space narrowing but no full-thickness cartilage
defect or osteochondral lesion. No joint effusion.

Subtalar Joints/Sinus Tarsi: Subtalar joint space narrowing with
cartilage thinning. Small joint effusions.

The sinus tarsi is unremarkable. The cervical and interosseous
ligaments are intact. The spring ligament is intact.

Bones: No bone contusions, marrow edema, bone lesion or AVN. No
stress fracture.

Mild midfoot degenerative changes are noted.

Other: Severe diffuse fatty atrophy of the foot and ankle
musculature likely the denervation process.
IMPRESSION: 1. Intact medial and lateral ankle ligaments and tendons.
2. Mild tenosynovitis involving the posterior tibialis tendon.
3. Mild tibiotalar, subtalar and midfoot degenerative changes.
4. Severe diffuse fatty atrophy of the foot and ankle musculature
likely the denervation process.

## 2019-10-10 ENCOUNTER — Ambulatory Visit: Payer: Medicare Other | Attending: Pain Medicine | Admitting: Anesthesiology

## 2019-10-10 ENCOUNTER — Encounter: Payer: Self-pay | Admitting: Anesthesiology

## 2019-10-10 DIAGNOSIS — M48061 Spinal stenosis, lumbar region without neurogenic claudication: Secondary | ICD-10-CM

## 2019-10-10 DIAGNOSIS — M5442 Lumbago with sciatica, left side: Secondary | ICD-10-CM

## 2019-10-10 DIAGNOSIS — M79642 Pain in left hand: Secondary | ICD-10-CM

## 2019-10-10 DIAGNOSIS — M0600 Rheumatoid arthritis without rheumatoid factor, unspecified site: Secondary | ICD-10-CM

## 2019-10-10 DIAGNOSIS — G8929 Other chronic pain: Secondary | ICD-10-CM

## 2019-10-10 DIAGNOSIS — F1120 Opioid dependence, uncomplicated: Secondary | ICD-10-CM | POA: Diagnosis not present

## 2019-10-10 DIAGNOSIS — G894 Chronic pain syndrome: Secondary | ICD-10-CM | POA: Diagnosis not present

## 2019-10-10 DIAGNOSIS — M48062 Spinal stenosis, lumbar region with neurogenic claudication: Secondary | ICD-10-CM

## 2019-10-10 DIAGNOSIS — M1711 Unilateral primary osteoarthritis, right knee: Secondary | ICD-10-CM

## 2019-10-10 DIAGNOSIS — M5441 Lumbago with sciatica, right side: Secondary | ICD-10-CM

## 2019-10-10 HISTORY — DX: Other chronic pain: G89.29

## 2019-10-10 MED ORDER — OXYCODONE HCL 5 MG PO TABS: 5.0000 mg | ORAL_TABLET | Freq: Four times a day (QID) | ORAL | 0 refills | Status: DC | PRN

## 2019-10-10 MED ORDER — OXYCODONE HCL 5 MG PO TABS
5.0000 mg | ORAL_TABLET | Freq: Four times a day (QID) | ORAL | 0 refills | Status: DC | PRN
Start: 2019-10-11 — End: 2019-12-31

## 2019-10-10 NOTE — Progress Notes (Signed)
Marland KitchenMarland KitchenMarland KitchenMarland KitchenMarland KitchenMarland KitchenMarland KitchenVirtual Visit via Telephone Note  I connected with [name] on 10/10/19 at  1:20 PM EDT by telephone and verified that I am speaking with the correct person using two identifiers.  Location: Patient: Home Provider: [facility]   I discussed the limitations, risks, security and privacy concerns of performing an evaluation and management service by telephone and the availability of in person appointments. I also discussed with the patient that there may be a patient responsible charge related to this service. The patient expressed understanding and agreed to proceed.   History of Present Illness: I spoke with Laura [name] via telephone today as I was unable to link up with the video portion of the virtual conference.  She reports that she is doing well with her opioid management therapy.  She has been on chronic opioids for prolonged period of time secondary to severe rheumatoid arthritis affecting both hands her knees and her low back.  She also has spinal stenosis.  Unfortunately she has failed more conservative therapy but has done well with the chronic opioid maintenance therapy.  She reports no side effects with the medications.  She takes them as prescribed and continues to get good relief.  Based on our discussion today she continues to derive good functional benefit from the medications and is otherwise completely disabled without opioid she reports.  The quality characteristic and distribution of her pain complex have been stable in nature.  Her lower extremity strength and function has been stable as well.    Observations/Objective:   Current Outpatient Medications:  .  albuterol (VENTOLIN HFA) 108 (90 Base) MCG/ACT inhaler, Inhale 2 puffs into the lungs every 6 (six) hours as needed for wheezing or shortness of breath., Disp: 18 g, Rfl: 3 .  aspirin EC 81 MG tablet, Take by mouth., Disp: , Rfl:  .  azelastine (ASTELIN) 0.1 % nasal spray, Place into the nose., Disp: ,  Rfl:  .  chlorthalidone (HYGROTON) 25 MG tablet, TAKE 1/2 TABLET (12.5MG ) BY MOUTH EVERY DAY, Disp: , Rfl: 11 .  Cholecalciferol (VITAMIN D) 2000 UNITS tablet, Take by mouth daily. , Disp: , Rfl:  .  DULoxetine (CYMBALTA) 60 MG capsule, Take 1 capsule (60 mg total) by mouth daily., Disp: 90 capsule, Rfl: 1 .  folic acid (FOLVITE) 1 MG tablet, Take 1 mg by mouth daily., Disp: , Rfl:  .  hydroxychloroquine (PLAQUENIL) 200 MG tablet, Take 100 mg by mouth daily. , Disp: , Rfl:  .  hydrOXYzine (VISTARIL) 25 MG capsule, Take 1-2 capsules (25-50 mg total) by mouth at bedtime as needed. For sleep, Disp: 180 capsule, Rfl: 1 .  inFLIXimab (REMICADE) 100 MG injection, Inject 100 mg into the vein every 8 (eight) weeks., Disp: , Rfl:  .  levothyroxine (SYNTHROID) 50 MCG tablet, Take 50 mcg by mouth at bedtime. , Disp: , Rfl:  .  levothyroxine (SYNTHROID, LEVOTHROID) 200 MCG tablet, TAKE 1 TABLET ONCE DAILY- ON AN EMPTY STOMACH WITH A GLASS OF WATER 30-60 MINUTES BEFORE BREAKFAST, Disp: , Rfl: 5 .  loratadine (CLARITIN) 10 MG tablet, Take 10 mg by mouth daily. , Disp: , Rfl:  .  methotrexate (RHEUMATREX) 2.5 MG tablet, TAKE 4 TABLETS (10 MG TOTAL) BY MOUTH EVERY 7 (SEVEN) DAYS WITH A MEAL, Disp: , Rfl: 5 .  metoprolol succinate (TOPROL-XL) 100 MG 24 hr tablet, Take 1 tablet (100 mg total) by mouth daily. Take with or immediately following a meal., Disp: 30 tablet, Rfl: 0 .  Multiple Vitamin (  MULTI-VITAMINS) TABS, Take by mouth., Disp: , Rfl:  .  [START ON 10/11/2019] oxyCODONE (OXY IR/ROXICODONE) 5 MG immediate release tablet, Take 1 tablet (5 mg total) by mouth every 6 (six) hours as needed for severe pain., Disp: 120 tablet, Rfl: 0 .  [START ON 11/10/2019] oxyCODONE (OXY IR/ROXICODONE) 5 MG immediate release tablet, Take 1 tablet (5 mg total) by mouth every 6 (six) hours as needed for severe pain. Must last 30 days, Disp: 120 tablet, Rfl: 0 .  [START ON 12/10/2019] oxyCODONE (OXY IR/ROXICODONE) 5 MG immediate  release tablet, Take 1 tablet (5 mg total) by mouth every 6 (six) hours as needed for severe pain. Must last 30 days, Disp: 120 tablet, Rfl: 0 .  rosuvastatin (CRESTOR) 5 MG tablet, Take 5 mg by mouth 2 (two) times a week., Disp: , Rfl:  .  spironolactone (ALDACTONE) 25 MG tablet, TAKE 1/2 (ONE-HALF) TABLET BY MOUTH DAILY, Disp: , Rfl: 11 .  valACYclovir (VALTREX) 1000 MG tablet, PLEASE SEE ATTACHED FOR DETAILED DIRECTIONS, Disp: , Rfl:  Assessment and Plan: 1. Chronic pain syndrome   2. Chronic low back pain (1ry area of Pain) (Bilateral) (L>R)   3. Uncomplicated opioid dependence (HCC)   4. 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 Spinal stenosis of lumbar region, unspecified whether neurogenic claudication present   7. Seronegative rheumatoid arthritis (HCC)   8. Osteoarthritis of knee (Right)   9. Chronic hand pain, left   Based on our discussion today upon review of the [facility] practitioner database information I think is appropriate to refill her medications.  This was done for October 1 October 31 and November 30.  We will schedule her for 82-month return to clinic.  I encouraged her to continue follow-up with her primary care physicians for her baseline medical care.  No other changes are initiated in regards to her pain management protocol.  Follow Up Instructions:    I discussed the assessment and treatment plan with the patient. The patient was provided an opportunity to ask questions and all were answered. The patient agreed with the plan and demonstrated an understanding of the instructions.   The patient was advised to call back or seek an in-person evaluation if the symptoms worsen or if the condition fails to improve as anticipated.  I provided 30 minutes of non-face-to-face time during this encounter.   [name], MD

## 2019-10-25 ENCOUNTER — Telehealth: Payer: Medicare Other | Admitting: Psychiatry

## 2019-10-26 LAB — COLOGUARD: COLOGUARD: NEGATIVE

## 2019-10-28 ENCOUNTER — Other Ambulatory Visit: Payer: Self-pay | Admitting: Child and Adolescent Psychiatry

## 2019-10-28 DIAGNOSIS — F5101 Primary insomnia: Secondary | ICD-10-CM

## 2019-10-28 NOTE — Telephone Encounter (Signed)
Dr. Eappen's pt

## 2019-10-29 ENCOUNTER — Other Ambulatory Visit: Payer: Self-pay

## 2019-10-29 ENCOUNTER — Encounter: Payer: Self-pay | Admitting: Internal Medicine

## 2019-10-29 ENCOUNTER — Ambulatory Visit (INDEPENDENT_AMBULATORY_CARE_PROVIDER_SITE_OTHER): Payer: Medicare Other | Admitting: Internal Medicine

## 2019-10-29 DIAGNOSIS — R0602 Shortness of breath: Secondary | ICD-10-CM

## 2019-10-29 DIAGNOSIS — R059 Cough, unspecified: Secondary | ICD-10-CM

## 2019-10-29 DIAGNOSIS — Z9989 Dependence on other enabling machines and devices: Secondary | ICD-10-CM

## 2019-10-29 DIAGNOSIS — J0140 Acute pansinusitis, unspecified: Secondary | ICD-10-CM | POA: Diagnosis not present

## 2019-10-29 DIAGNOSIS — G4733 Obstructive sleep apnea (adult) (pediatric): Secondary | ICD-10-CM

## 2019-10-29 DIAGNOSIS — J4520 Mild intermittent asthma, uncomplicated: Secondary | ICD-10-CM | POA: Diagnosis not present

## 2019-10-29 MED ORDER — AZITHROMYCIN 250 MG PO TABS: ORAL_TABLET | ORAL | 0 refills | Status: DC

## 2019-10-29 NOTE — Progress Notes (Signed)
[facility] [address] [place], [place] [address]  Pulmonary Sleep Medicine   Office Visit Note  Patient Name: Laura [name] DOB: 1945/12/15 MRN [id number]  Date of Service: 11/01/2019  Complaints/HPI: Patient is here for routine pulmonary follow-up Followed for OSA on CPAP, asthma as well as GERD CPAP-has not had a recent download, she reports nightly compliance with CPAP with no issues or complications, denies dryness, congestion or headaches Asthma is well controlled without the use of a daily inhaler, rarely feels the need to use her rescue albuterol inhaler She does feel as though she is coming down with a cold, she complains of nasal congestion as well as sinus pain and pressure, as well as coughing at times productive cough Afebrile  ROS  General: (-) fever, (-) chills, (-) night sweats, (-) weakness Skin: (-) rashes, (-) itching,. Eyes: (-) visual changes, (-) redness, (-) itching. Nose and Sinuses: (-) nasal stuffiness or itchiness, (-) postnasal drip, (-) nosebleeds, (-) sinus trouble. Mouth and Throat: (-) sore throat, (-) hoarseness. Neck: (-) swollen glands, (-) enlarged thyroid, (-) neck pain. Respiratory: - cough, (-) bloody sputum, - shortness of breath, - wheezing. Cardiovascular: - ankle swelling, (-) chest pain. Lymphatic: (-) lymph node enlargement. Neurologic: (-) numbness, (-) tingling. Psychiatric: (-) anxiety, (-) depression   Current Medication: Outpatient Encounter Medications as of 10/29/2019  Medication Sig Note  . albuterol (VENTOLIN HFA) 108 (90 Base) MCG/ACT inhaler Inhale 2 puffs into the lungs every 6 (six) hours as needed for wheezing or shortness of breath.   Marland Kitchen aspirin EC 81 MG tablet Take by mouth.   Marland Kitchen azelastine (ASTELIN) 0.1 % nasal spray Place into the nose.   . chlorthalidone (HYGROTON) 25 MG tablet TAKE 1/2 TABLET (12.5MG ) BY MOUTH EVERY DAY   . Cholecalciferol (VITAMIN D) 2000 UNITS tablet Take by mouth daily.     . DULoxetine (CYMBALTA) 60 MG capsule Take 1 capsule (60 mg total) by mouth daily.   . folic acid (FOLVITE) 1 MG tablet Take 1 mg by mouth daily.   . hydroxychloroquine (PLAQUENIL) 200 MG tablet Take 100 mg by mouth daily.    . hydrOXYzine (VISTARIL) 25 MG capsule Take 1-2 capsules (25-50 mg total) by mouth at bedtime as needed. For sleep   . inFLIXimab (REMICADE) 100 MG injection Inject 100 mg into the vein every 8 (eight) weeks.   Marland Kitchen levothyroxine (SYNTHROID) 50 MCG tablet Take 50 mcg by mouth at bedtime.    Marland Kitchen levothyroxine (SYNTHROID, LEVOTHROID) 200 MCG tablet TAKE 1 TABLET ONCE DAILY- ON AN EMPTY STOMACH WITH A GLASS OF WATER 30-60 MINUTES BEFORE BREAKFAST   . loratadine (CLARITIN) 10 MG tablet Take 10 mg by mouth daily.    . methotrexate (RHEUMATREX) 2.5 MG tablet TAKE 4 TABLETS (10 MG TOTAL) BY MOUTH EVERY 7 (SEVEN) DAYS WITH A MEAL 10/06/2018: Sundays   . metoprolol succinate (TOPROL-XL) 100 MG 24 hr tablet Take 1 tablet (100 mg total) by mouth daily. Take with or immediately following a meal.   . Multiple Vitamin (MULTI-VITAMINS) TABS Take by mouth.   . oxyCODONE (OXY IR/ROXICODONE) 5 MG immediate release tablet Take 1 tablet (5 mg total) by mouth every 6 (six) hours as needed for severe pain.   [name] ON 11/10/2019] oxyCODONE (OXY IR/ROXICODONE) 5 MG immediate release tablet Take 1 tablet (5 mg total) by mouth every 6 (six) hours as needed for severe pain. Must last 30 days   . [START ON 12/10/2019] oxyCODONE (OXY IR/ROXICODONE) 5 MG  immediate release tablet Take 1 tablet (5 mg total) by mouth every 6 (six) hours as needed for severe pain. Must last 30 days   . rosuvastatin (CRESTOR) 5 MG tablet Take 5 mg by mouth 2 (two) times a week.   . spironolactone (ALDACTONE) 25 MG tablet TAKE 1/2 (ONE-HALF) TABLET BY MOUTH DAILY   . valACYclovir (VALTREX) 1000 MG tablet PLEASE SEE ATTACHED FOR DETAILED DIRECTIONS   . azithromycin (ZITHROMAX) 250 MG tablet Take one tablet daily    No  facility-administered encounter medications on file as of 10/29/2019.    Surgical History: Past Surgical History:  Procedure Laterality Date  . ABDOMINAL HYSTERECTOMY    . CESAREAN SECTION    . HIP SURGERY Right   . REPLACEMENT TOTAL KNEE Left     Medical History: Past Medical History:  Diagnosis Date  . Allergy   . Anxiety   . Arthritis, degenerative 10/08/2013   Overview:    a.  Lumbar spine/spinal stenosis/foot drop.   b.  Hands.   . Asthma   . Chronic hand pain, left 10/10/2019  . Chronic kidney disease   . Depression   . 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 Major depression, single episode, in complete remission (HCC) 06/25/2015  . Memory loss, short term 03/19/2014  . Seizure (HCC) 10/07/2014  . Sleep apnea   . Sleep apnea   . Thyroid disease     Family History: Family History  Problem Relation Age of Onset  . Hypertension Mother   . Stroke Mother   . Heart attack Father   . Alcohol abuse Father   . Depression Father   . Post-traumatic stress disorder Father   . Rheum arthritis Sister   . Hypertension Sister   . Depression Sister   . Breast cancer Neg Hx     Social History: Social History   Socioeconomic History  . Marital status: Married    Spouse name: Not on file  . Number of children: Not on file  . Years of education: Not on file  . Highest education level: Not on file  Occupational History    Comment: retired  Tobacco Use  . Smoking status: Never Smoker  . Smokeless tobacco: Never Used  Vaping Use  . Vaping Use: Never used  Substance and Sexual Activity  . Alcohol use: No    Alcohol/week: 0.0 standard drinks  . Drug use: No  . Sexual activity: Not Currently  Other Topics Concern  . Not on file  Social History Narrative  . Not on file   Social Determinants of Health   Financial Resource Strain:   . Difficulty of Paying Living Expenses: Not on file  Food Insecurity:   . Worried About Programme researcher, broadcasting/film/video in  the Last Year: Not on file  . Ran Out of Food in the Last Year: Not on file  Transportation Needs:   . Lack of Transportation (Medical): Not on file  . Lack of Transportation (Non-Medical): Not on file  Physical Activity:   . Days of Exercise per Week: Not on file  . Minutes of Exercise per Session: Not on file  Stress:   . Feeling of Stress : Not on file  Social Connections:   . Frequency of Communication with Friends and Family: Not on file  . Frequency of Social Gatherings with Friends and Family: Not on file  . Attends Religious Services: Not on file  . Active Member of Clubs or Organizations: Not on file  .  Attends Banker Meetings: Not on file  . Marital Status: Not on file  Intimate Partner Violence:   . Fear of Current or Ex-Partner: Not on file  . Emotionally Abused: Not on file  . Physically Abused: Not on file  . Sexually Abused: Not on file    Vital Signs: Blood pressure (!) 142/84, pulse 84, temperature 98 F (36.7 C), resp. rate 16, height 5\' 5"  (1.651 m), weight 280 lb (127 kg), SpO2 95 %.  Examination: General Appearance: The patient is well-developed, well-nourished, and in no distress. Skin: Gross inspection of skin unremarkable. Head: normocephalic, no gross deformities. Eyes: no gross deformities noted. ENT: ears appear grossly normal no exudates. Neck: Supple. No thyromegaly. No LAD. Respiratory: Clear throughout, no rhonchi, wheezing or rales noted. Cardiovascular: Normal S1 and S2 without murmur or rub. Extremities: No cyanosis. pulses are equal. Neurologic: Alert and oriented. No involuntary movements.  LABS: Recent Results (from the past 2160 hour(s))  CBC with Differential     Status: None   Collection Time: 10/03/19  4:12 PM  Result Value Ref Range   WBC 7.9 4.0 - 10.5 K/uL   RBC 4.66 3.87 - 5.11 MIL/uL   Hemoglobin 14.2 12.0 - 15.0 g/dL   HCT 00.3 36 - 46 %   MCV 92.9 80.0 - 100.0 fL   MCH 30.5 26.0 - 34.0 pg   MCHC 32.8 30.0  - 36.0 g/dL   RDW 49.1 79.1 - 50.5 %   Platelets 313 150 - 400 K/uL   nRBC 0.0 0.0 - 0.2 %   Neutrophils Relative % 66 %   Neutro Abs 5.2 1.7 - 7.7 K/uL   Lymphocytes Relative 24 %   Lymphs Abs 1.9 0.7 - 4.0 K/uL   Monocytes Relative 7 %   Monocytes Absolute 0.6 0.1 - 1.0 K/uL   Eosinophils Relative 2 %   Eosinophils Absolute 0.2 0.0 - 0.5 K/uL   Basophils Relative 1 %   Basophils Absolute 0.1 0.0 - 0.1 K/uL   Immature Granulocytes 0 %   Abs Immature Granulocytes 0.02 0.00 - 0.07 K/uL    Comment: Performed at [facility], [address] [place], [place] [address]  Comprehensive metabolic panel     Status: Abnormal   Collection Time: 10/03/19  4:12 PM  Result Value Ref Range   Sodium 136 135 - 145 mmol/L   Potassium 4.2 3.5 - 5.1 mmol/L   Chloride 100 98 - 111 mmol/L   CO2 24 22 - 32 mmol/L   Glucose, Bld 146 (H) 70 - 99 mg/dL    Comment: Glucose reference range applies only to samples taken after fasting for at least 8 hours.   BUN 24 (H) 8 - 23 mg/dL   Creatinine, Ser 8.01 (H) 0.44 - 1.00 mg/dL   Calcium 9.0 8.9 - 65.5 mg/dL   Total Protein 7.6 6.5 - 8.1 g/dL   Albumin 4.4 3.5 - 5.0 g/dL   AST 23 15 - 41 U/L   ALT 16 0 - 44 U/L   Alkaline Phosphatase 55 38 - 126 U/L   Total Bilirubin 0.6 0.3 - 1.2 mg/dL   GFR calc non Af Amer 49 (L) >60 mL/min   GFR calc Af Amer 57 (L) >60 mL/min   Anion gap 12 5 - 15    Comment: Performed at [facility], [address], [place], [place] [address]    Radiology: MR ANKLE LEFT WO CONTRAST  Result Date: 10/10/2019 CLINICAL DATA:  Left foot drop. Ankle swelling. Posterior foot and ankle pain. EXAM: MRI OF THE LEFT ANKLE WITHOUT CONTRAST TECHNIQUE: Multiplanar, multisequence MR imaging of the ankle was performed. No intravenous contrast was administered. COMPARISON:  Radiographs 10/03/2019 FINDINGS: TENDONS Peroneal: Intact.  No significant tendinopathy or tenosynovitis. Posteromedial: Intact. Mild tenosynovitis  involving the posterior tibialis tendon. No tendinopathy. Anterior: Intact.  No significant tendinopathy or tenosynovitis. Achilles: Intact.  Minimal distal tendinopathy. Plantar Fascia: Intact.  No findings for plantar fasciitis. LIGAMENTS Lateral: Intact Medial: Intact CARTILAGE Ankle Joint: Mild degenerative chondrosis with cartilage thinning and mild joint space narrowing but no full-thickness cartilage defect or osteochondral lesion. No joint effusion. Subtalar Joints/Sinus Tarsi: Subtalar joint space narrowing with cartilage thinning. Small joint effusions. The sinus tarsi is unremarkable. The cervical and interosseous ligaments are intact. The spring ligament is intact. Bones: No bone contusions, marrow edema, bone lesion or AVN. No stress fracture. Mild midfoot degenerative changes are noted. Other: Severe diffuse fatty atrophy of the foot and ankle musculature likely the denervation process. IMPRESSION: 1. Intact medial and lateral ankle ligaments and tendons. 2. Mild tenosynovitis involving the posterior tibialis tendon. 3. Mild tibiotalar, subtalar and midfoot degenerative changes. 4. Severe diffuse fatty atrophy of the foot and ankle musculature likely the denervation process. Electronically Signed   By: Rudie Meyer M.D.   On: 10/10/2019 11:00    No results found.  MR ANKLE LEFT WO CONTRAST  Result Date: 10/10/2019 CLINICAL DATA:  Left foot drop. Ankle swelling. Posterior foot and ankle pain. EXAM: MRI OF THE LEFT ANKLE WITHOUT CONTRAST TECHNIQUE: Multiplanar, multisequence MR imaging of the ankle was performed. No intravenous contrast was administered. COMPARISON:  Radiographs 10/03/2019 FINDINGS: TENDONS Peroneal: Intact.  No significant tendinopathy or tenosynovitis. Posteromedial: Intact. Mild tenosynovitis involving the posterior tibialis tendon. No tendinopathy. Anterior: Intact.  No significant tendinopathy or tenosynovitis. Achilles: Intact.  Minimal distal tendinopathy. Plantar Fascia:  Intact.  No findings for plantar fasciitis. LIGAMENTS Lateral: Intact Medial: Intact CARTILAGE Ankle Joint: Mild degenerative chondrosis with cartilage thinning and mild joint space narrowing but no full-thickness cartilage defect or osteochondral lesion. No joint effusion. Subtalar Joints/Sinus Tarsi: Subtalar joint space narrowing with cartilage thinning. Small joint effusions. The sinus tarsi is unremarkable. The cervical and interosseous ligaments are intact. The spring ligament is intact. Bones: No bone contusions, marrow edema, bone lesion or AVN. No stress fracture. Mild midfoot degenerative changes are noted. Other: Severe diffuse fatty atrophy of the foot and ankle musculature likely the denervation process. IMPRESSION: 1. Intact medial and lateral ankle ligaments and tendons. 2. Mild tenosynovitis involving the posterior tibialis tendon. 3. Mild tibiotalar, subtalar and midfoot degenerative changes. 4. Severe diffuse fatty atrophy of the foot and ankle musculature likely the denervation process. Electronically Signed   By: Rudie Meyer M.D.   On: 10/10/2019 11:00   US Venous Img Lower Unilateral Left  Result Date: 10/03/2019 CLINICAL DATA:  Left lower extremity pain and edema EXAM: LEFT LOWER EXTREMITY VENOUS DUPLEX ULTRASOUND TECHNIQUE: Gray-scale sonography with graded compression, as well as color Doppler and duplex ultrasound were performed to evaluate the left lower extremity deep venous system from the level of the common femoral vein and including the common femoral, femoral, profunda femoral, popliteal and calf veins including the posterior tibial, peroneal and gastrocnemius veins when visible. The superficial great saphenous vein was also interrogated. Spectral Doppler was utilized to evaluate flow at rest and with distal augmentation maneuvers in the common femoral, femoral and popliteal veins. COMPARISON:  None. FINDINGS: Contralateral Common  Femoral Vein: Respiratory phasicity is normal  and symmetric with the symptomatic side. No evidence of thrombus. Normal compressibility. Common Femoral Vein: No evidence of thrombus. Normal compressibility, respiratory phasicity and response to augmentation. Saphenofemoral Junction: No evidence of thrombus. Normal compressibility and flow on color Doppler imaging. Profunda Femoral Vein: No evidence of thrombus. Normal compressibility and flow on color Doppler imaging. Femoral Vein: No evidence of thrombus. Normal compressibility, respiratory phasicity and response to augmentation. Popliteal Vein: No evidence of thrombus. Normal compressibility, respiratory phasicity and response to augmentation. Calf Veins: No evidence of thrombus. Normal compressibility and flow on color Doppler imaging. Superficial Great Saphenous Vein: No evidence of thrombus. Normal compressibility. Venous Reflux:  None. Other Findings:  None. IMPRESSION: No evidence of deep venous thrombosis in the left lower extremity. Right common femoral vein also patent. Electronically Signed   By: Bretta Bang III M.D.   On: 10/03/2019 16:07   DG Foot Complete Left  Result Date: 10/03/2019 CLINICAL DATA:  Foot pain over week ago, walking down steps in turn foot, continues to have pain EXAM: LEFT FOOT - COMPLETE 3+ VIEW COMPARISON:  None FINDINGS: Osteopenia. Midfoot degenerative changes and degenerative changes throughout the foot. Plantar enthesopathy. No radiopaque foreign body. IMPRESSION: Osteopenia with degenerative changes.  No acute abnormality. Electronically Signed   By: Donzetta Kohut M.D.   On: 10/03/2019 13:44      Assessment and Plan: Patient Active Problem List   Diagnosis Date Noted  . Chronic hand pain, left 10/10/2019  . MDD (major depressive disorder), recurrent, in full remission (HCC) 11/22/2018  . Paroxysmal atrial fibrillation (HCC) 10/16/2018  . Coronary artery disease involving native coronary artery of native heart without angina pectoris 10/15/2018  .  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 Pneumonia 10/06/2018  . Immunosuppression (HCC) 09/18/2018  . Pharmacologic therapy 07/11/2018  . Disorder of skeletal system 07/11/2018  . Problems influencing health status 07/11/2018  . MDD (major depressive disorder), recurrent episode, mild (HCC) 07/09/2018  . Primary insomnia 07/09/2018  . Bereavement 07/09/2018  . Body mass index (BMI) of 40.0-44.9 in adult (HCC) 10/09/2017  . Postmenopausal 04/06/2017  . Chronic arthralgias of knees and hips (Right) 08/18/2016  . Arthritis 07/11/2016  . Dyspnea on exertion 07/11/2016  . Kidney disease 07/11/2016  . 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 Snoring 07/11/2016  . Morbid obesity with BMI of 40.0-44.9, adult (HCC) 07/11/2016  . Opioid-induced constipation (OIC) 04/21/2016  . Osteoarthritis of knee (Right) 01/25/2016  . Diet-controlled diabetes mellitus (HCC) 10/03/2015  . GAD (generalized anxiety disorder) 09/24/2015  . Disturbance of skin sensation 07/15/2015  . Elevated sedimentation rate 07/15/2015  . Elevated C-reactive protein (CRP) 07/15/2015  . Chronic hip pain (Left) 04/06/2015  . History of TKR (total knee replacement) (Left) 02/09/2015  . History of femur fracture (Right) 02/09/2015  . 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 Lumbar foraminal stenosis (Bilateral L3-4 and L5-S1) 02/09/2015  . Chronic low back pain (1ry area of Pain) (Bilateral) (L>R) 01/22/2015  . Chronic pain syndrome 11/25/2014  . Uncomplicated opioid dependence (HCC) 11/07/2014  . Lumbar spinal stenosis (5 mm Severe L3-4; 8 mm L4-5) w/ neurogenic claudication 11/07/2014  . Lumbar spondylosis 11/07/2014  . Lumbar facet syndrome (Bilateral) (L>R) 11/07/2014  . Chronic knee pain (Right) 11/05/2014  . Opiate use (30 MME/Day) 11/05/2014  . Long term current use of opiate analgesic 11/05/2014  . Long term prescription  opiate use 11/05/2014  .  Encounter for therapeutic drug level monitoring 11/05/2014  . Morbid obesity (HCC) 10/27/2014  . Extreme obesity (HCC) 10/07/2014  . Type 2 diabetes mellitus (HCC) 03/19/2014  . Depressive disorder 10/14/2013  . Essential (primary) hypertension 10/14/2013  . Insomnia secondary to chronic pain 07/09/2013  . Anxiety 07/09/2013  . GERD (gastroesophageal reflux disease) 01/18/2013  . Hypothyroidism 01/18/2013  . Hypercholesterolemia 01/18/2013  . 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 CKD (chronic kidney disease) stage 3, GFR 30-59 ml/min (HCC) 01/17/2013  . Congestive heart failure with left ventricular systolic dysfunction (HCC) 11/10/2012   1. Acute non-recurrent pansinusitis Will treat with Azithromycin, she has been around her husband who has also been having similar symptoms Advised to contact office if symptoms worsen or have not improved within 10 days - azithromycin (ZITHROMAX) 250 MG tablet; Take one tablet daily  Dispense: 6 tablet; Refill: 0  2. Mild intermittent asthma without complication Symptoms remain stable, continue with routine monitoring  3. Cough Will obtain CXR for productive coughing--will review and adjust plan of care as needed - DG Chest 2 View; Future  4. SOB (shortness of breath) Normal spirometry today, FEV1 1.8L, 82% predicted - Spirometry with Graph  5. OSA on CPAP Continue with nightly CPAP compliance--will schedule for download review  General Counseling: I have discussed the findings of the evaluation and examination with Laura [name].  I have also discussed any further diagnostic evaluation thatmay be needed or ordered today. Laura [name] verbalizes understanding of the findings of todays visit. We also reviewed her medications today and discussed drug interactions and side effects including but not limited excessive drowsiness and altered mental states. We also discussed that there is always a risk not just to her  but also people around her. she has been encouraged to call the office with any questions or concerns that should arise related to todays visit.  Orders Placed This Encounter  Procedures  . DG Chest 2 View    Standing Status:   Future    Standing Expiration Date:   10/28/2020    Order Specific Question:   Reason for Exam (SYMPTOM  OR DIAGNOSIS REQUIRED)    Answer:   cough    Order Specific Question:   Preferred imaging location?    Answer:   [facility]  . Spirometry with Graph    Order Specific Question:   Where should this test be performed?    Answer:   [facility]    Order Specific Question:   Basic spirometry    Answer:   Yes     Time spent: 30 Time spent includes review of chart, medications, test results and follow-up plan with the patient.  I have personally obtained a history, examined the patient, evaluated laboratory and imaging results, formulated the assessment and plan and placed orders. This patient was seen by Brent General AGNP-C in Collaboration with Dr. Freda Munro as a part of collaborative care agreement.    Yevonne Pax, MD [facility] Pulmonary and Critical Care Sleep medicine

## 2019-10-30 ENCOUNTER — Encounter: Payer: Self-pay | Admitting: Internal Medicine

## 2019-11-01 ENCOUNTER — Encounter: Payer: Self-pay | Admitting: Internal Medicine

## 2019-11-01 NOTE — Patient Instructions (Signed)

## 2019-11-06 ENCOUNTER — Other Ambulatory Visit: Payer: Self-pay | Admitting: Child and Adolescent Psychiatry

## 2019-11-06 DIAGNOSIS — F5101 Primary insomnia: Secondary | ICD-10-CM

## 2019-11-06 NOTE — Telephone Encounter (Signed)
Dr. Eappen's pt

## 2019-11-11 ENCOUNTER — Ambulatory Visit
Admission: RE | Admit: 2019-11-11 | Discharge: 2019-11-11 | Disposition: A | Discharge status: Home / Self Care | Payer: Medicare Other | Source: Ambulatory Visit | Attending: Hospice and Palliative Medicine | Admitting: Hospice and Palliative Medicine

## 2019-11-11 ENCOUNTER — Ambulatory Visit
Admission: RE | Admit: 2019-11-11 | Discharge: 2019-11-11 | Disposition: A | Discharge status: Home / Self Care | Payer: Medicare Other | Source: Ambulatory Visit | Attending: Internal Medicine | Admitting: Internal Medicine

## 2019-11-11 DIAGNOSIS — R059 Cough, unspecified: Secondary | ICD-10-CM

## 2019-11-11 IMAGING — CR DG CHEST 2V
1 series · 2 of 2 positions shown · non-contrast
Comparison: Radiograph and CT [DATE]

CLINICAL DATA: Cough for 2 weeks.  History of asthma.

EXAM:
CHEST - 2 VIEW

[Series 4: w chest pa · 0.14mm/px · 2 of 2 slices shown]
[im 1/2]
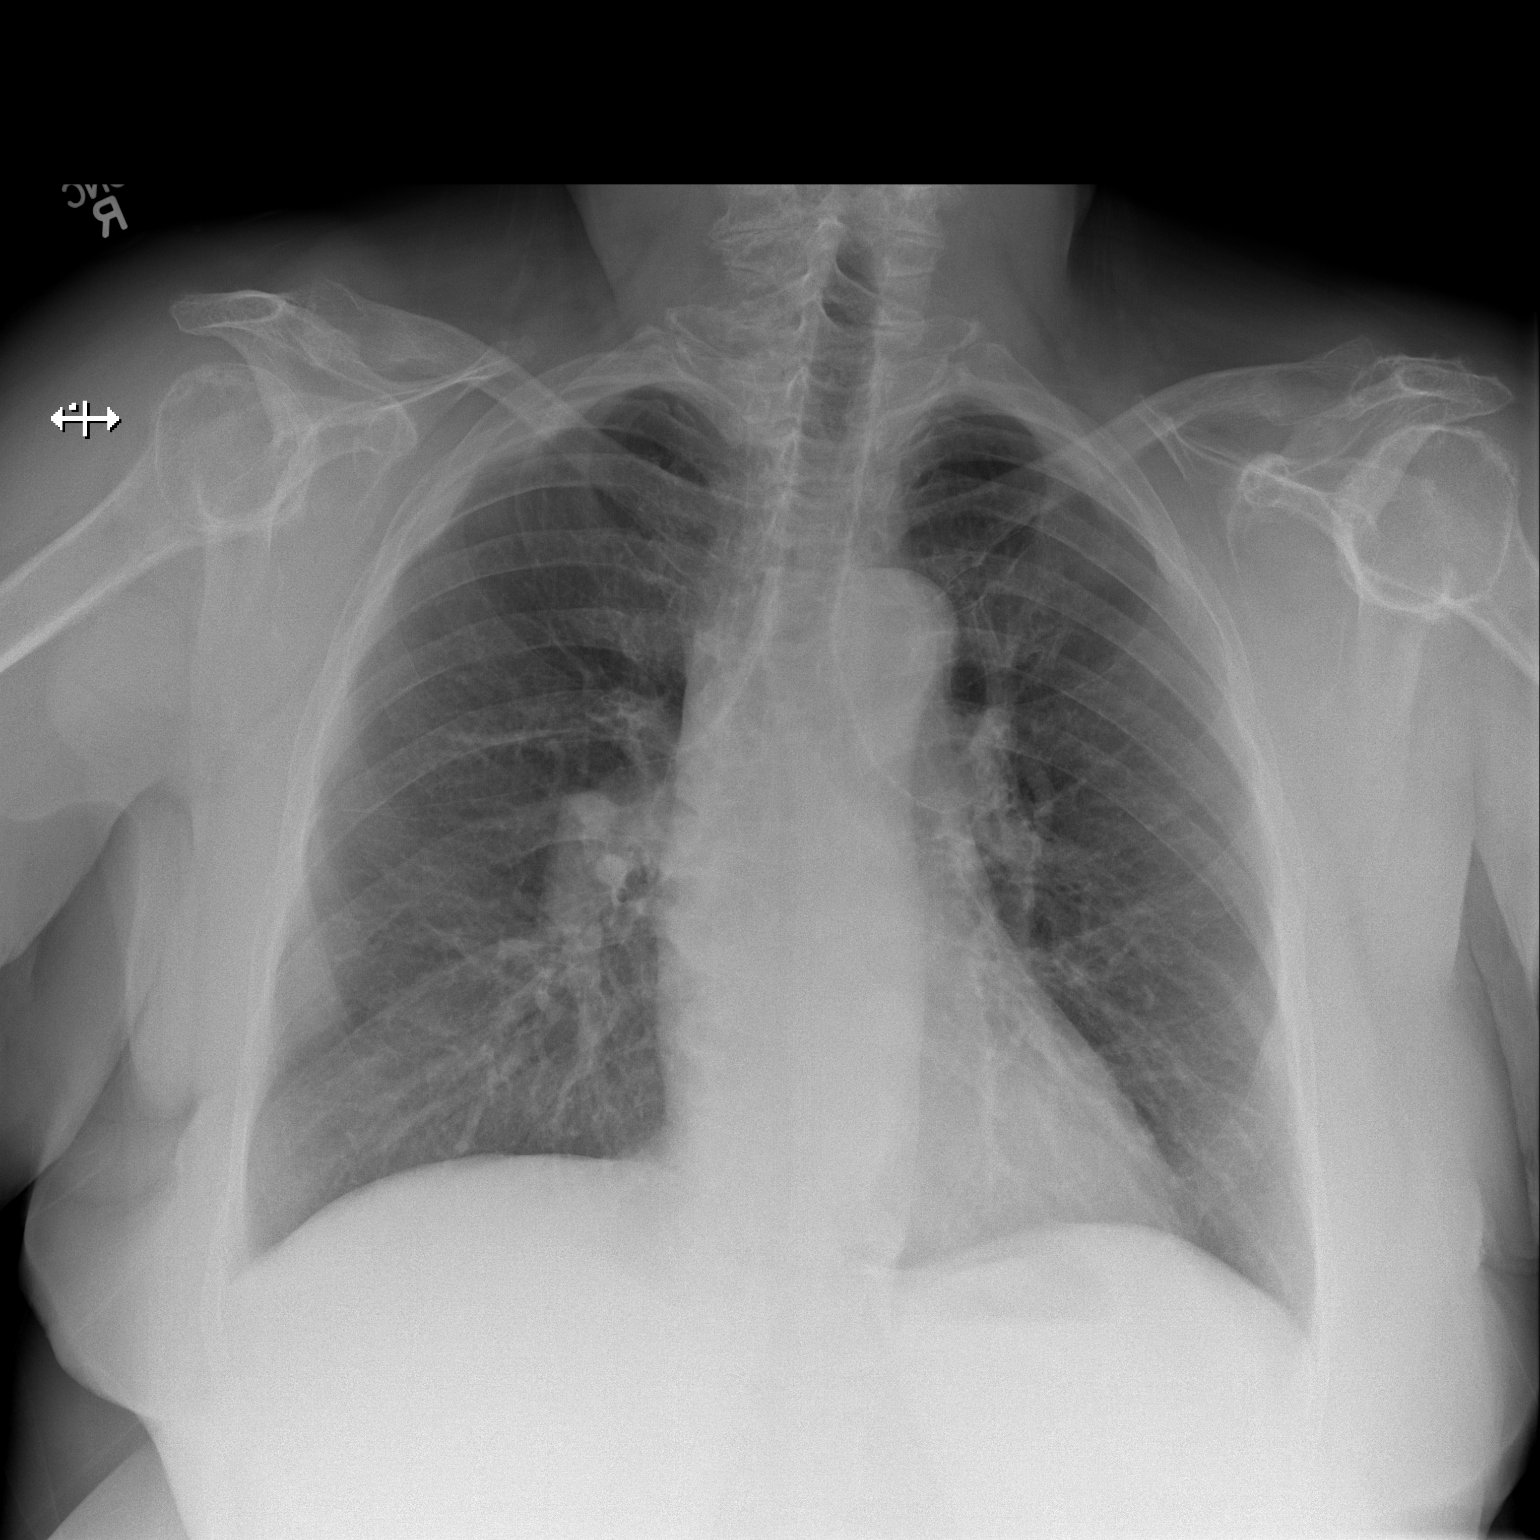
[im 2/2]
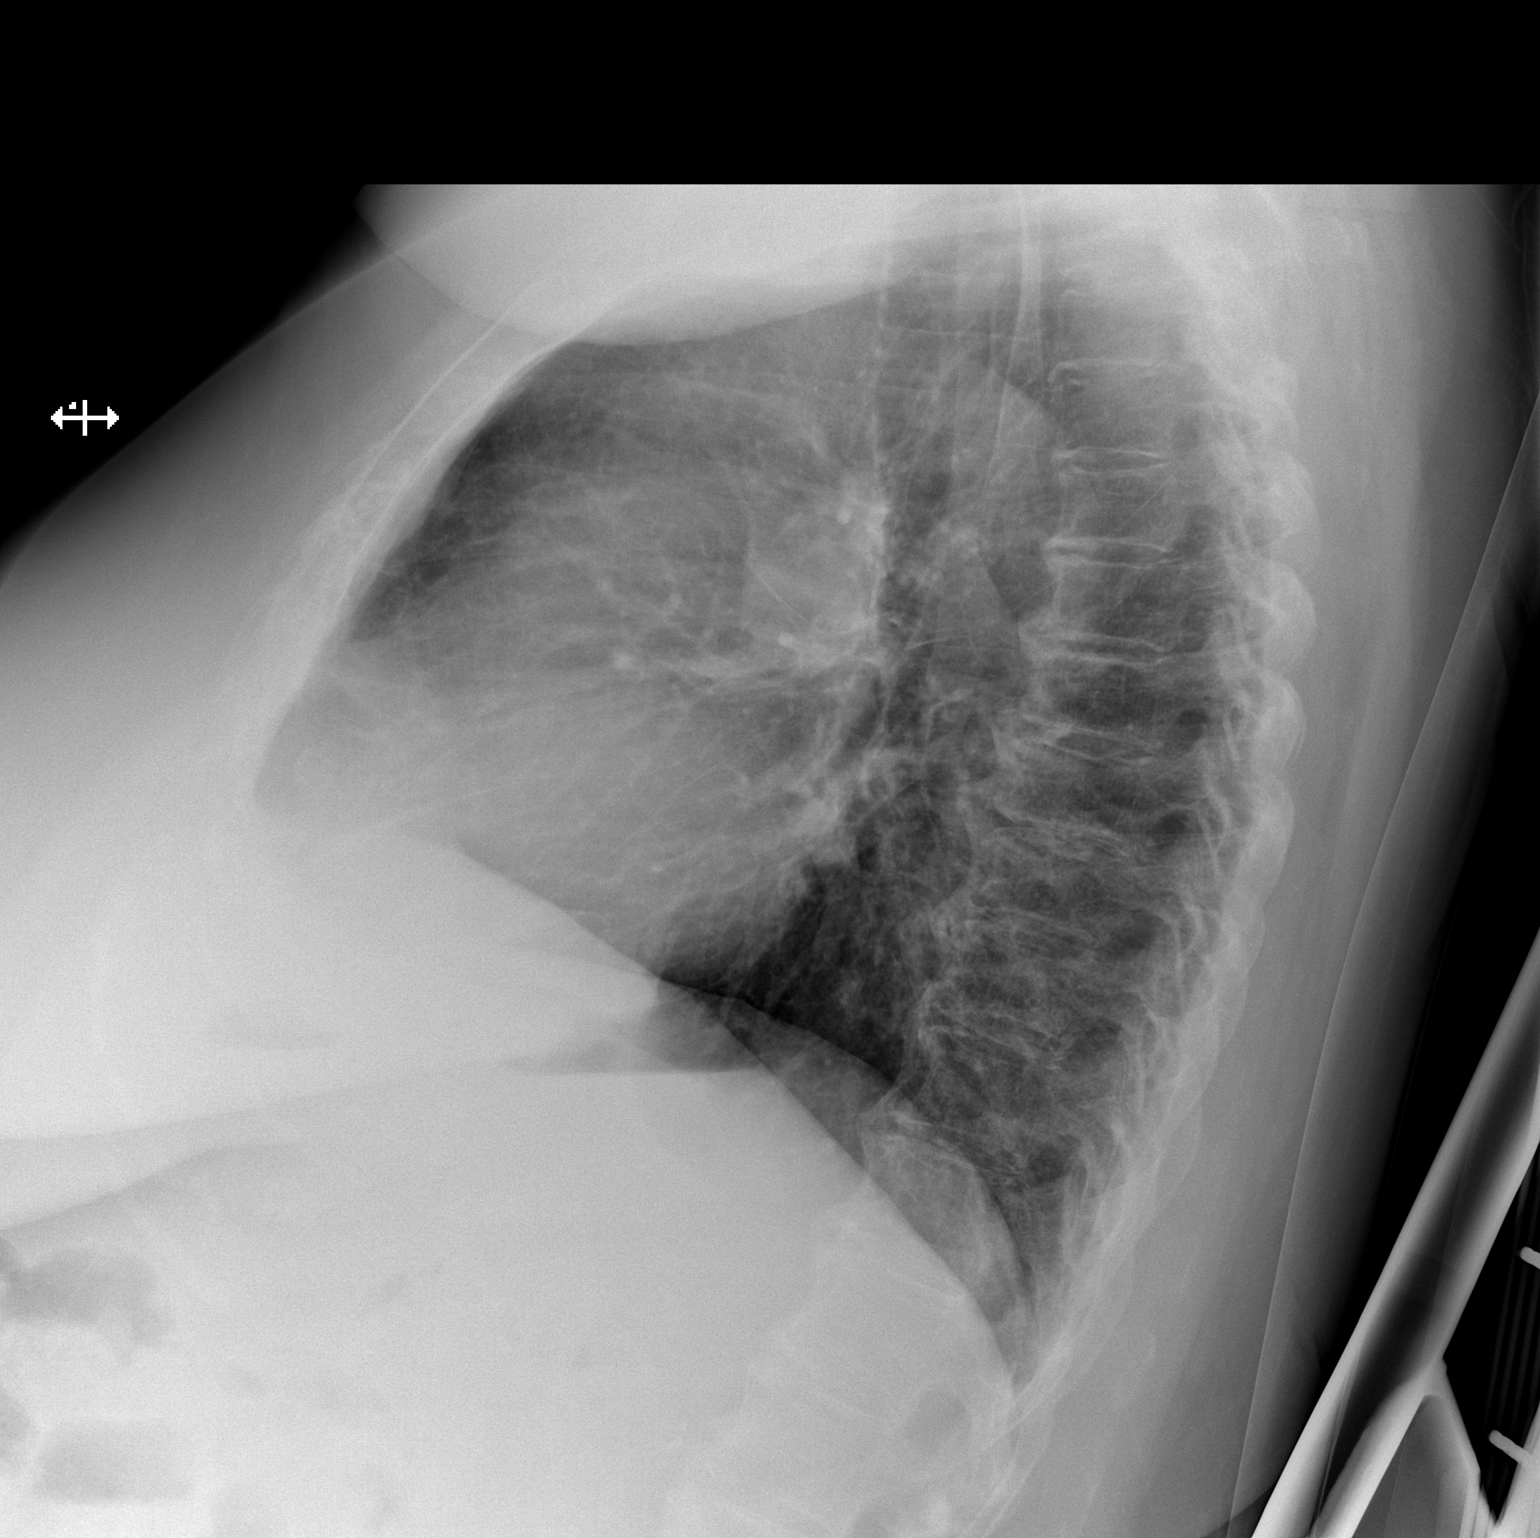

[2 of 2 positions shown; findings below may reference images not displayed]

FINDINGS: Previous airspace opacity in the right upper lobe has resolved with
trace residual subsegmental scarring. No new or acute consolidation.
The heart is normal in size. Normal mediastinal contours. Mild
peribronchial thickening. No pleural fluid. No pneumothorax.
Thoracic spondylosis. No acute osseous abnormalities.
IMPRESSION: 1. Mild peribronchial thickening, can be seen with asthma or
bronchitis.
2. Previous right upper lobe pneumonia has resolved with trace
residual subsegmental scarring.

## 2019-11-14 ENCOUNTER — Telehealth: Payer: Self-pay

## 2019-11-14 NOTE — Telephone Encounter (Signed)
lmom to call us back

## 2019-11-14 NOTE — Telephone Encounter (Signed)
As per taylor pt advised chest xray is stable

## 2019-11-20 ENCOUNTER — Other Ambulatory Visit: Payer: Self-pay | Admitting: Psychiatry

## 2019-11-20 DIAGNOSIS — R29898 Other symptoms and signs involving the musculoskeletal system: Secondary | ICD-10-CM | POA: Insufficient documentation

## 2019-11-20 DIAGNOSIS — M21372 Foot drop, left foot: Secondary | ICD-10-CM | POA: Insufficient documentation

## 2019-11-20 DIAGNOSIS — F3342 Major depressive disorder, recurrent, in full remission: Secondary | ICD-10-CM

## 2019-11-24 ENCOUNTER — Other Ambulatory Visit: Payer: Self-pay | Admitting: Psychiatry

## 2019-11-24 DIAGNOSIS — F5101 Primary insomnia: Secondary | ICD-10-CM

## 2019-12-02 ENCOUNTER — Other Ambulatory Visit: Payer: Self-pay | Admitting: Psychiatry

## 2019-12-02 DIAGNOSIS — F3342 Major depressive disorder, recurrent, in full remission: Secondary | ICD-10-CM

## 2019-12-03 ENCOUNTER — Telehealth: Payer: Self-pay

## 2019-12-03 NOTE — Telephone Encounter (Signed)
pt called states that hydroxyzine she has questions about she states that she is nauses and she is peeing alot.

## 2019-12-03 NOTE — Telephone Encounter (Signed)
Returned call to patient.  She reports she has kidney issues going on however is currently following up with her providers.  She has been feeling nauseous and has been urinating a lot more and wonders whether the hydroxyzine could be causing it.  Discussed with patient to hold the hydroxyzine for the next couple of days and monitor her symptoms since it is hard to say if her underlying medical problems are causing her symptoms versus the medication.

## 2019-12-16 ENCOUNTER — Other Ambulatory Visit: Payer: Self-pay | Admitting: Acute Care

## 2019-12-16 ENCOUNTER — Other Ambulatory Visit (HOSPITAL_COMMUNITY): Payer: Self-pay | Admitting: Acute Care

## 2019-12-16 ENCOUNTER — Ambulatory Visit: Payer: Self-pay | Admitting: Urology

## 2019-12-16 DIAGNOSIS — M21372 Foot drop, left foot: Secondary | ICD-10-CM

## 2019-12-26 ENCOUNTER — Ambulatory Visit
Admission: RE | Admit: 2019-12-26 | Discharge: 2019-12-26 | Disposition: A | Discharge status: Home / Self Care | Payer: Medicare Other | Source: Ambulatory Visit | Attending: Acute Care | Admitting: Acute Care

## 2019-12-26 ENCOUNTER — Other Ambulatory Visit: Payer: Self-pay

## 2019-12-26 ENCOUNTER — Telehealth (INDEPENDENT_AMBULATORY_CARE_PROVIDER_SITE_OTHER): Payer: Medicare Other | Admitting: Psychiatry

## 2019-12-26 ENCOUNTER — Encounter: Payer: Self-pay | Admitting: Psychiatry

## 2019-12-26 DIAGNOSIS — F5101 Primary insomnia: Secondary | ICD-10-CM | POA: Diagnosis not present

## 2019-12-26 DIAGNOSIS — F3342 Major depressive disorder, recurrent, in full remission: Secondary | ICD-10-CM

## 2019-12-26 DIAGNOSIS — M21372 Foot drop, left foot: Secondary | ICD-10-CM

## 2019-12-26 IMAGING — MR MR LUMBAR SPINE W/O CM
5 series · 30 of 48 positions shown · non-contrast
Comparison: [DATE]

CLINICAL DATA: Low back pain with left buttock and leg pain,
numbness, and tingling. Left foot drop.

EXAM:
MRI LUMBAR SPINE WITHOUT CONTRAST
TECHNIQUE: Multiplanar, multisequence MR imaging of the lumbar spine was
performed. No intravenous contrast was administered.

[Series 5: T2 · sagittal · 4.0mm · 0.81mm/px · 6 of 17 slices shown (1 of 2)]
[im 1/17]
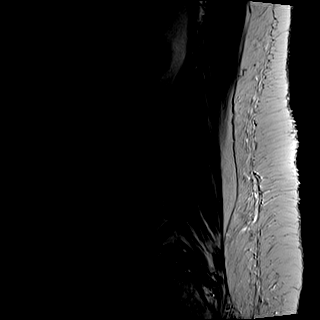
[im 4/17]
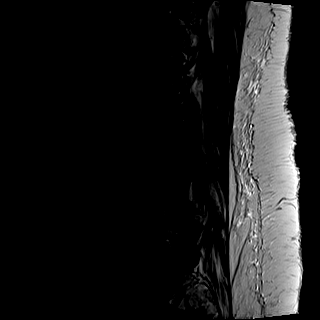
[im 7/17]
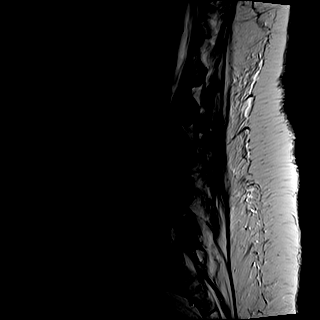
[im 10/17]
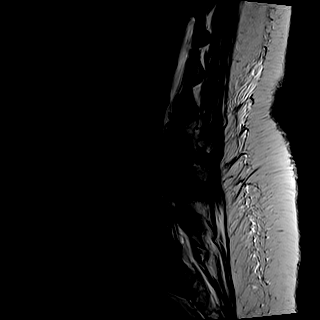
[im 13/17]
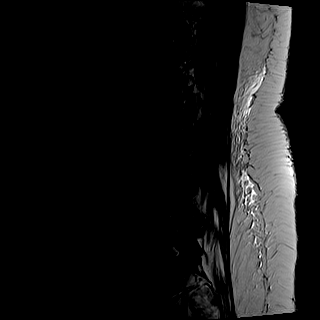
[im 17/17]
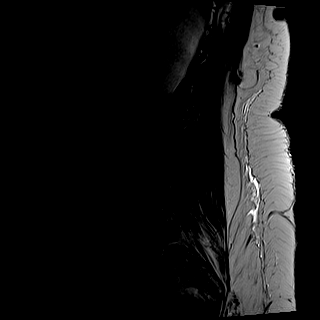

[Series 6: T1 · sagittal · 4.0mm · 0.81mm/px · 7 of 17 slices shown (1 of 2)]
[im 1/17]
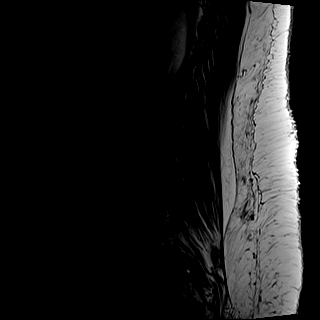
[im 3/17]
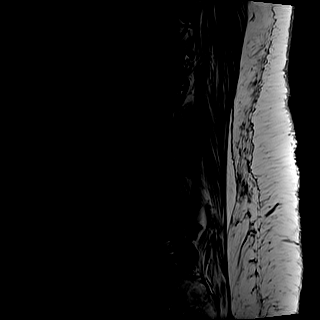
[im 6/17]
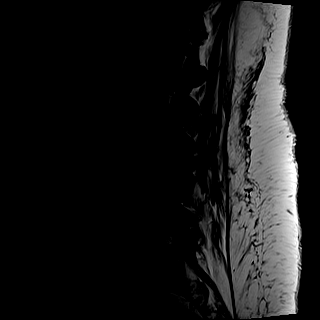
[im 9/17]
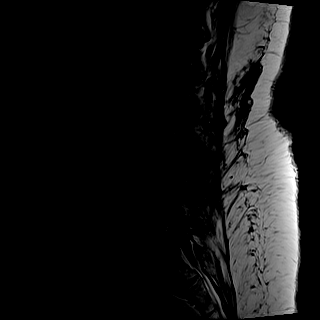
[im 11/17]
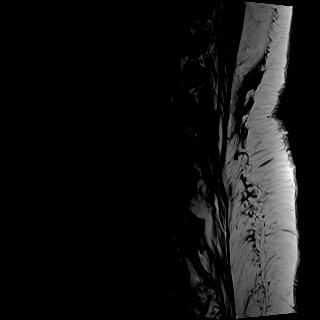
[im 14/17]
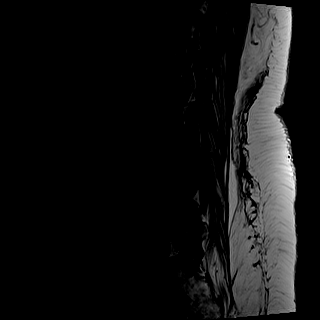
[im 17/17]
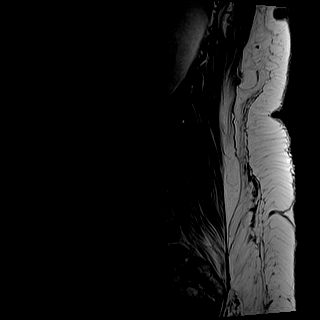

[Series 7: STIR · sagittal · 4.0mm · 0.41mm/px · 1 of 17 slices shown]
[im 1/17]
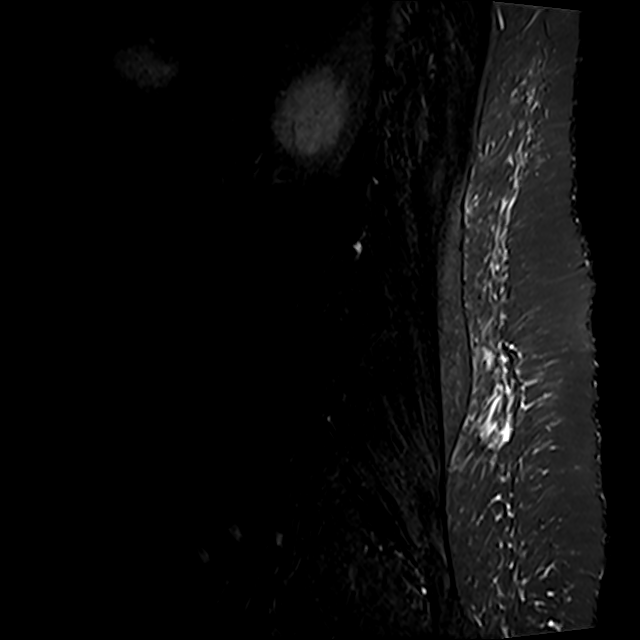

[Series 8: T2 · axial · 4.0mm · 0.78mm/px · z∈[-99,+110]mm · 8 of 35 slices shown (2 of 2)]
[im 1/35]
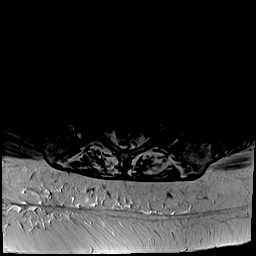
[im 6/35]
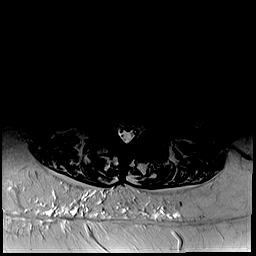
[im 11/35]
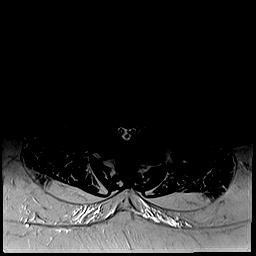
[im 16/35]
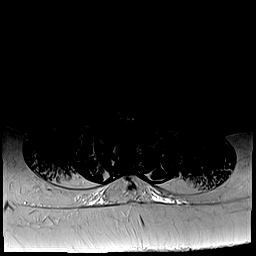
[im 19/35]
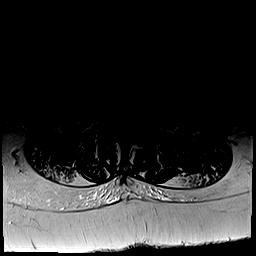
[im 24/35]
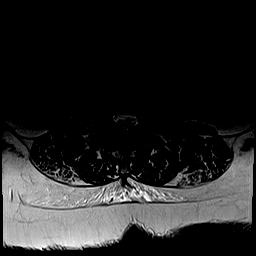
[im 29/35]
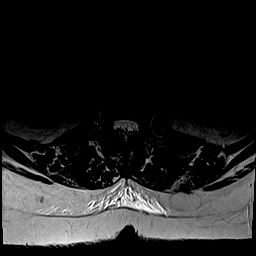
[im 35/35]
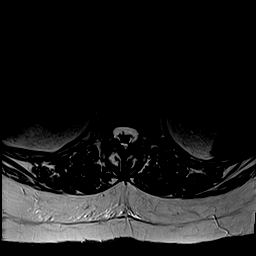

[Series 9: T1 · axial · 4.0mm · 0.39mm/px · z∈[-99,+110]mm · 8 of 35 slices shown (2 of 2)]
[im 1/35]
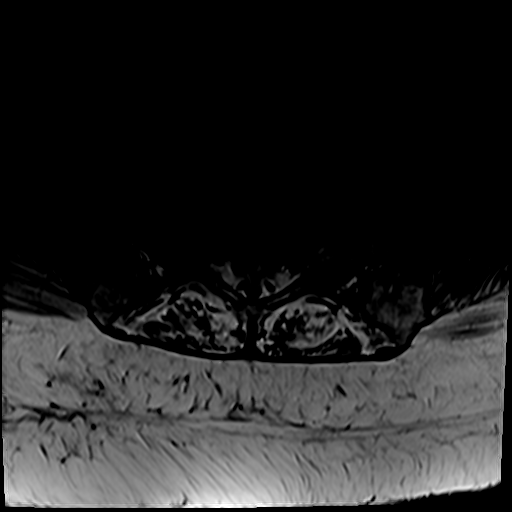
[im 6/35]
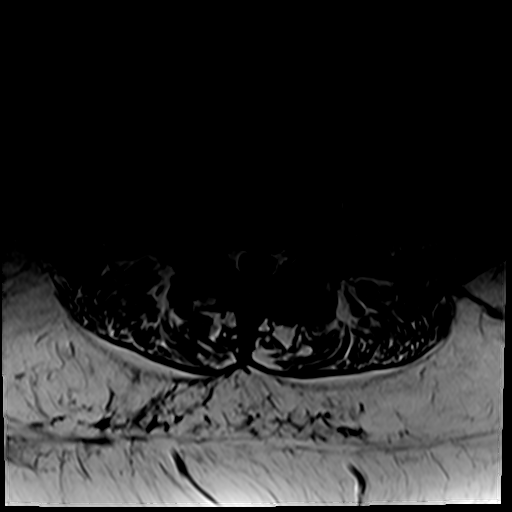
[im 11/35]
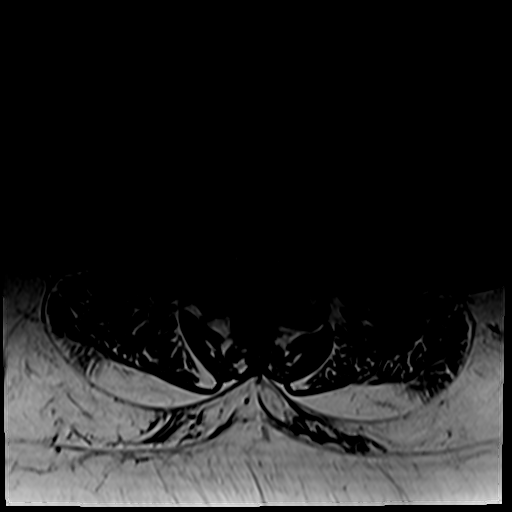
[im 16/35]
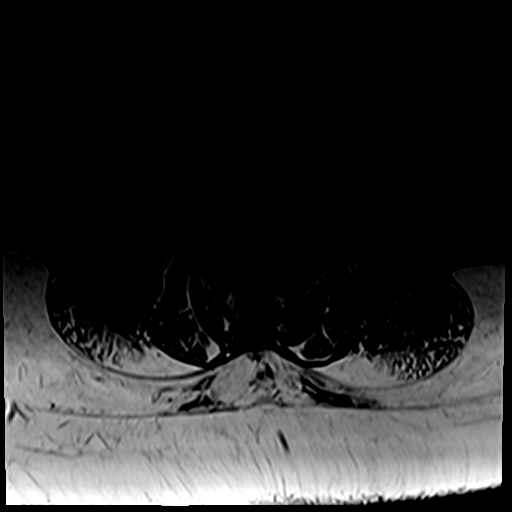
[im 19/35]
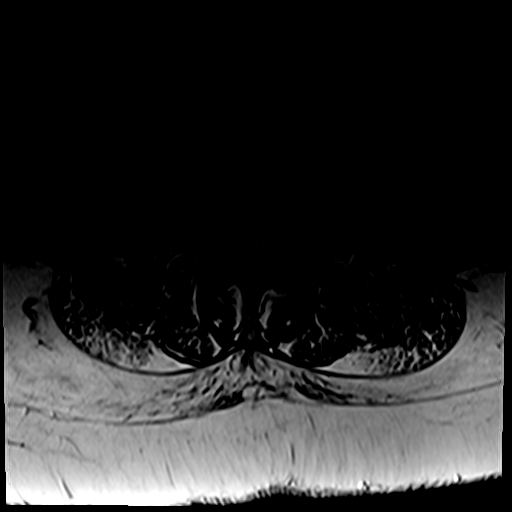
[im 24/35]
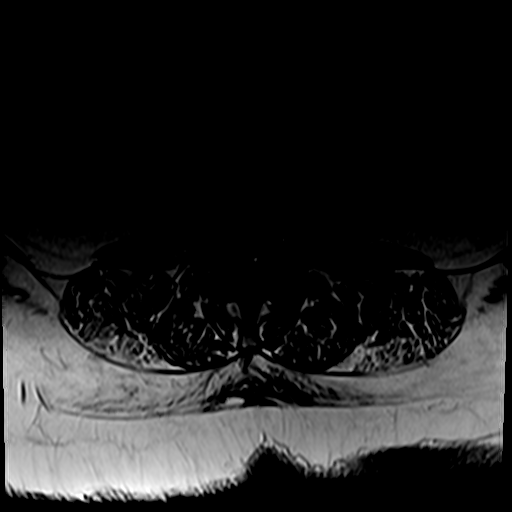
[im 29/35]
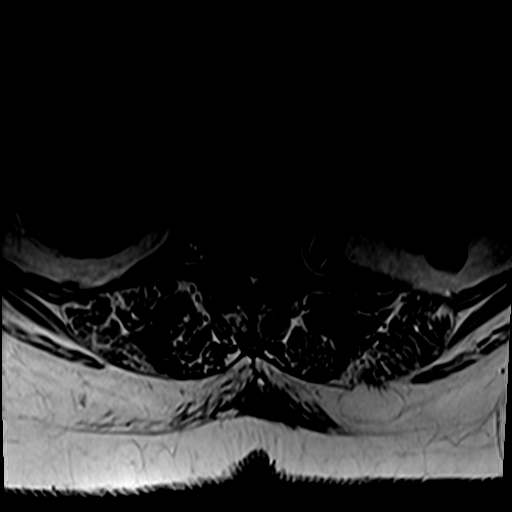
[im 35/35]
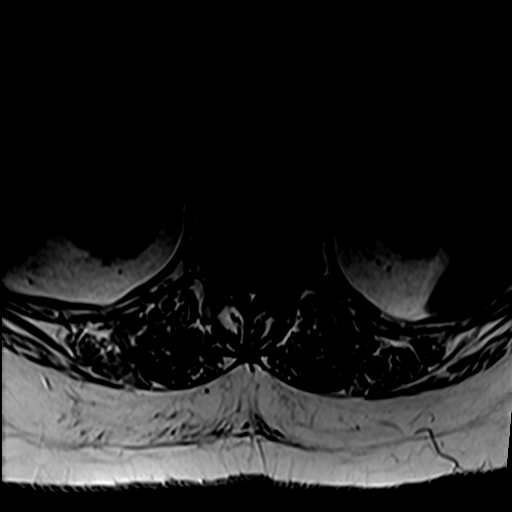

[30 of 48 positions shown; findings below may reference images not displayed]

FINDINGS: Segmentation:  Standard.

Alignment: Unchanged grade 1 anterolisthesis of L3 on L4 and L4 on
L5, facet mediated.

Vertebrae: No acute fracture or suspicious osseous lesion. New L2
superior endplate Schmorl's node without edema. Unchanged mild
central endplate compression deformities in the lower lumbar spine.

Conus medullaris and cauda equina: Conus extends to the upper L1
level and appears normal. Chronic redundancy of the cauda equina
secondary to high-grade spinal stenosis.

Paraspinal and other soft tissues: Small bilateral renal cysts.

Disc levels:

Disc desiccation throughout the lumbar spine. Diffuse congenital
narrowing of the lumbar spinal canal due to short pedicles.

T12-L1: Mild facet arthrosis without disc herniation or stenosis.

L1-2: Mild disc bulging and moderate facet and ligamentum flavum
hypertrophy result in new mild spinal stenosis without neural
foraminal stenosis.

L2-3: Disc bulging and moderate facet and ligamentum flavum
hypertrophy result in moderate spinal stenosis, mild right and
mild-to-moderate left lateral recess stenosis, and mild right and
moderate left neural foraminal stenosis, slightly progressed.

L3-4: Anterolisthesis with bulging uncovered disc and severe facet
and ligamentum flavum hypertrophy result in severe spinal stenosis,
severe bilateral lateral recess stenosis, and severe right and
moderate left neural foraminal stenosis, not significantly changed.

L4-5: Anterolisthesis with bulging uncovered disc and severe facet
hypertrophy result in moderate spinal stenosis, moderate to severe
bilateral lateral recess stenosis, and moderate bilateral neural
foraminal stenosis, not significantly changed.

L5-S1: Mild disc bulging eccentric to the right and mild to moderate
facet arthrosis result in mild right neural foraminal stenosis
without spinal stenosis, unchanged.
IMPRESSION: 1. Slight progression of moderate spinal stenosis at L2-3.
2. New mild spinal stenosis at L1-2.
3. Unchanged severe spinal stenosis and severe right and moderate
left neural foraminal stenosis at L3-4.
4. Unchanged moderate spinal stenosis and moderate neural foraminal
stenosis at L4-5.

## 2019-12-26 MED ORDER — BELSOMRA 5 MG PO TABS: 5.0000 mg | ORAL_TABLET | Freq: Every day | ORAL | 0 refills | Status: DC

## 2019-12-26 NOTE — Progress Notes (Signed)
Virtual Visit via Telephone Note  I connected with Laura [name] on 12/26/19 at  4:40 PM EST by telephone and verified that I am speaking with the correct person using two identifiers.  Location Provider Location : [facility] Patient Location : Home  Participants: Patient , Provider   I discussed the limitations, risks, security and privacy concerns of performing an evaluation and management service by telephone and the availability of in person appointments. I also discussed with the patient that there may be a patient responsible charge related to this service. The patient expressed understanding and agreed to proceed.   I discussed the assessment and treatment plan with the patient. The patient was provided an opportunity to ask questions and all were answered. The patient agreed with the plan and demonstrated an understanding of the instructions.   The patient was advised to call back or seek an in-person evaluation if the symptoms worsen or if the condition fails to improve as anticipated.   [facility] MD OP Progress Note  12/26/2019 5:12 PM Laura [name]  MRN:  [id number]  Chief Complaint:  Chief Complaint    Follow-up     HPI: Laura [name] is a 74 year old Caucasian female who has a history of MDD, primary insomnia, chronic pain was evaluated by telemedicine today.  Patient today reports she is currently unable to sleep on the melatonin.  She reports she hence took a previous prescription of Seroquel 300 mg that she had and slept well last night.  She wonders whether she can go back on it.  Other than having sleep problems she denies any sadness, crying spells.  She denies any depressive symptoms.  She denies any anxiety symptoms.  She continues to be compliant on the Cymbalta.  Denies side effects.  Patient reports she does have situational stressors of having health issues.  She reports she was recently diagnosed with foot drop, and is currently undergoing treatment for the  same.  That does limit her functioning.  Patient denies any suicidality, homicidality or perceptual disturbances.  Patient denies any other concerns today.  Visit Diagnosis:    ICD-10-CM   1. MDD (major depressive disorder), recurrent, in full remission (HCC)  F33.42   2. Primary insomnia  F51.01 Suvorexant (BELSOMRA) 5 MG TABS    Past Psychiatric History: I have reviewed past psychiatric history from my progress note on 06/13/2018  Past Medical History:  Past Medical History:  Diagnosis Date  . Allergy   . Anxiety   . Arthritis, degenerative 10/08/2013   Overview:    a.  Lumbar spine/spinal stenosis/foot drop.   b.  Hands.   . Asthma   . Chronic hand pain, left 10/10/2019  . Chronic kidney disease   . Depression   . 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 Major depression, single episode, in complete remission (HCC) 06/25/2015  . Memory loss, short term 03/19/2014  . Seizure (HCC) 10/07/2014  . Sleep apnea   . Sleep apnea   . Thyroid disease     Past Surgical History:  Procedure Laterality Date  . ABDOMINAL HYSTERECTOMY    . CESAREAN SECTION    . HIP SURGERY Right   . REPLACEMENT TOTAL KNEE Left     Family Psychiatric History: Reviewed family psychiatric history from my progress note on 07/10/2018  Family History:  Family History  Problem Relation Age of Onset  . Hypertension Mother   . Stroke Mother   . Heart attack Father   . Alcohol abuse Father   .  Depression Father   . Post-traumatic stress disorder Father   . Rheum arthritis Sister   . Hypertension Sister   . Depression Sister   . Breast cancer Neg Hx     Social History: Reviewed social history from my progress note on 06/13/2018 Social History   Socioeconomic History  . Marital status: Married    Spouse name: Not on file  . Number of children: Not on file  . Years of education: Not on file  . Highest education level: Not on file  Occupational History    Comment: retired   Tobacco Use  . Smoking status: Never Smoker  . Smokeless tobacco: Never Used  Vaping Use  . Vaping Use: Never used  Substance and Sexual Activity  . Alcohol use: No    Alcohol/week: 0.0 standard drinks  . Drug use: No  . Sexual activity: Not Currently  Other Topics Concern  . Not on file  Social History Narrative  . Not on file   Social Determinants of Health   Financial Resource Strain: Not on file  Food Insecurity: Not on file  Transportation Needs: Not on file  Physical Activity: Not on file  Stress: Not on file  Social Connections: Not on file    Allergies:  Allergies  Allergen Reactions  . Bupropion   . Penicillins Rash    Metabolic Disorder Labs: No results found for: HGBA1C, MPG No results found for: PROLACTIN No results found for: CHOL, TRIG, HDL, CHOLHDL, VLDL, LDLCALC Lab Results  Component Value Date   TSH 0.383 10/07/2018    Therapeutic Level Labs: No results found for: LITHIUM No results found for: VALPROATE No components found for:  CBMZ  Current Medications: Current Outpatient Medications  Medication Sig Dispense Refill  . albuterol (VENTOLIN HFA) 108 (90 Base) MCG/ACT inhaler Inhale 2 puffs into the lungs every 6 (six) hours as needed for wheezing or shortness of breath. 18 g 3  . aspirin EC 81 MG tablet Take by mouth.    Marland Kitchen azelastine (ASTELIN) 0.1 % nasal spray Place into the nose.    Marland Kitchen azithromycin (ZITHROMAX) 250 MG tablet Take one tablet daily 6 tablet 0  . chlorthalidone (HYGROTON) 25 MG tablet TAKE 1/2 TABLET (12.5MG ) BY MOUTH EVERY DAY  11  . Cholecalciferol (VITAMIN D) 2000 UNITS tablet Take by mouth daily.     . DULoxetine (CYMBALTA) 60 MG capsule Take 1 capsule (60 mg total) by mouth daily. 90 capsule 1  . folic acid (FOLVITE) 1 MG tablet Take 1 mg by mouth daily.    . hydroxychloroquine (PLAQUENIL) 200 MG tablet Take 100 mg by mouth daily.     . hydrOXYzine (VISTARIL) 25 MG capsule Take 1-2 capsules (25-50 mg total) by mouth at  bedtime as needed. For sleep 180 capsule 1  . inFLIXimab (REMICADE) 100 MG injection Inject 100 mg into the vein every 8 (eight) weeks.    Marland Kitchen levothyroxine (SYNTHROID) 50 MCG tablet Take 50 mcg by mouth at bedtime.     Marland Kitchen levothyroxine (SYNTHROID, LEVOTHROID) 200 MCG tablet TAKE 1 TABLET ONCE DAILY- ON AN EMPTY STOMACH WITH A GLASS OF WATER 30-60 MINUTES BEFORE BREAKFAST  5  . loratadine (CLARITIN) 10 MG tablet Take 10 mg by mouth daily.     . methotrexate (RHEUMATREX) 2.5 MG tablet TAKE 4 TABLETS (10 MG TOTAL) BY MOUTH EVERY 7 (SEVEN) DAYS WITH A MEAL  5  . metoprolol succinate (TOPROL-XL) 100 MG 24 hr tablet Take 1 tablet (100 mg total) by  mouth daily. Take with or immediately following a meal. 30 tablet 0  . Multiple Vitamin (MULTI-VITAMINS) TABS Take by mouth.    . oxyCODONE (OXY IR/ROXICODONE) 5 MG immediate release tablet Take 1 tablet (5 mg total) by mouth every 6 (six) hours as needed for severe pain. 120 tablet 0  . oxyCODONE (OXY IR/ROXICODONE) 5 MG immediate release tablet Take 1 tablet (5 mg total) by mouth every 6 (six) hours as needed for severe pain. Must last 30 days 120 tablet 0  . oxyCODONE (OXY IR/ROXICODONE) 5 MG immediate release tablet Take 1 tablet (5 mg total) by mouth every 6 (six) hours as needed for severe pain. Must last 30 days 120 tablet 0  . rosuvastatin (CRESTOR) 5 MG tablet Take 5 mg by mouth 2 (two) times a week.    . spironolactone (ALDACTONE) 25 MG tablet TAKE 1/2 (ONE-HALF) TABLET BY MOUTH DAILY  11  . Suvorexant (BELSOMRA) 5 MG TABS Take 5 mg by mouth at bedtime. 15 tablet 0  . valACYclovir (VALTREX) 1000 MG tablet PLEASE SEE ATTACHED FOR DETAILED DIRECTIONS     No current facility-administered medications for this visit.     Musculoskeletal: Strength & Muscle Tone: UTA Gait & Station: UTA Patient leans: N/A  Psychiatric Specialty Exam: Review of Systems  Musculoskeletal:       Left foot weakness - recent diagnosis of foot drop  Psychiatric/Behavioral:  Positive for sleep disturbance.  All other systems reviewed and are negative.   There were no vitals taken for this visit.There is no height or weight on file to calculate BMI.  General Appearance: UTA  Eye Contact:  UTA  Speech:  Clear and Coherent  Volume:  Normal  Mood:  Euthymic  Affect:  UTA  Thought Process:  Goal Directed and Descriptions of Associations: Intact  Orientation:  Full (Time, Place, and Person)  Thought Content: Logical   Suicidal Thoughts:  No  Homicidal Thoughts:  No  Memory:  Immediate;   Fair Recent;   Fair Remote;   Fair  Judgement:  Fair  Insight:  Fair  Psychomotor Activity:  UTA  Concentration:  Concentration: Fair and Attention Span: Fair  Recall:  [employer] of Knowledge: Fair  Language: Fair  Akathisia:  No  Handed:  Right  AIMS (if indicated): UTA  Assets:  Communication Skills Desire for Improvement Housing Social Support  ADL's:  Intact  Cognition: WNL  Sleep:  Poor   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[place] Office Visit from 03/14/2018 in [facility] Office Visit from 06/19/2017 in [facility] Office Visit from 05/29/2017 in [facility], [facility] Office Visit from 04/18/2017 in [facility], [facility] Clinical Support from 04/13/2017 in [facility]  PHQ-2 Total Score 0 0 2 0 0       Assessment and Plan: Laura [name] is a 74 year old Caucasian female, married, retired, lives in [place], has a history of depression, currently in remission, primary insomnia, hypertension, congestive heart failure, GERD, diabetes melitis, hypothyroidism, rheumatoid arthritis, chronic pain, recent diagnosis of foot drop, was evaluated by telemedicine today.  Patient is currently struggling with sleep.  Plan as noted below.  Plan MDD in remission Cymbalta 60 mg p.o. daily-reduced dosage  Primary insomnia-unstable Start Belsomra 5 mg p.o.  nightly Provided medication education Continue hydroxyzine 25 to 50 mg at bedtime as needed. Discussed with patient to monitor her sleep closely and to reach out to writer in  a week for further medication readjustment.  Follow-up in clinic in 4 weeks or sooner if needed.  I have spent atleast 20 minutes non face to face with patient today. More than 50 % of the time was spent for preparing to see the patient ( e.g., review of test, records ),  ordering medications and test ,psychoeducation and supportive psychotherapy and care coordination,as well as documenting clinical information in electronic health record. This note was generated in part or whole with voice recognition software. Voice recognition is usually quite accurate but there are transcription errors that can and very often do occur. I apologize for any typographical errors that were not detected and corrected.      Jomarie Longs, MD 12/27/2019, 8:12 AM

## 2019-12-26 NOTE — Patient Instructions (Signed)
Suvorexant oral tablets °What is this medicine? °SUVOREXANT (su-vor-EX-ant) is used to treat insomnia. This medicine helps you to fall asleep and sleep through the night. °This medicine may be used for other purposes; ask your health care provider or pharmacist if you have questions. °COMMON BRAND NAME(S): Belsomra °What should I tell my health care provider before I take this medicine? °They need to know if you have any of these conditions: °· depression °· drink alcohol °· drug abuse or addiction °· feel sleepy or have fallen asleep suddenly during the day °· history of a sudden onset of muscle weakness (cataplexy) °· liver disease °· lung or breathing disease, like asthma or emphysema °· sleep apnea °· suicidal thoughts, plans, or attempt; a previous suicide attempt by you or a family member °· an unusual or allergic reaction to suvorexant, other medicines, foods, dyes, or preservatives °· pregnant or trying to get pregnant °· breast-feeding °How should I use this medicine? °Take this medicine by mouth within 30 minutes of going to bed. Do not take it unless you are able to stay in bed a full night before you must be active again. Follow the directions on the prescription label. You may take this medicine with or without a food. However, this medicine may take longer to work if you take it with or right after meals. Do not take your medicine more often than directed. Do not stop taking this medicine on your own. Always follow your doctor or health care professional's advice. °A special MedGuide will be given to you by the pharmacist with each prescription and refill. Be sure to read this information carefully each time. °Talk to your pediatrician regarding the use of this medicine in children. Special care may be needed. °Overdosage: If you think you have taken too much of this medicine contact a poison control center or emergency room at once. °NOTE: This medicine is only for you. Do not share this medicine with  others. °What if I miss a dose? °This medicine should only be taken immediately before going to sleep. Do not take double or extra doses. °What may interact with this medicine? °· alcohol °· antihistamines for allergy, cough, or cold °· aprepitant °· boceprevir °· certain antibiotics like ciprofloxacin, clarithromycin, erythromycin, telithromycin °· certain antivirals for HIV or AIDS °· certain medicines for anxiety or sleep °· certain medicines for depression like amitriptyline, fluoxetine, nefazodone, sertraline °· certain medicines for fungal infections like ketoconazole, posaconazole, fluconazole, itraconazole °· certain medicines for seizures like carbamazepine, phenobarbital, primidone, phenytoin °· conivaptan °· digoxin °· diltiazem °· general anesthetics like halothane, isoflurane, methoxyflurane, propofol °· grapefruit juice °· imatinib °· medicines that relax muscles for surgery °· narcotic medicines for pain °· phenothiazines like chlorpromazine, mesoridazine, prochlorperazine, thioridazine °· rifampin °· verapamil °This list may not describe all possible interactions. Give your health care provider a list of all the medicines, herbs, non-prescription drugs, or dietary supplements you use. Also tell them if you smoke, drink alcohol, or use illegal drugs. Some items may interact with your medicine. °What should I watch for while using this medicine? °Visit your health care professional for regular checks on your progress. Tell your health care professional if your symptoms do not start to get better or if they get worse. Avoid caffeine-containing drinks in the evening hours. °After taking this medicine, you may get up out of bed and do an activity that you do not know you are doing. The next morning, you may have no memory of this.   Activities include driving a car ("sleep-driving"), making and eating food, talking on the phone, sexual activity, and sleep-walking. Serious injuries have occurred. Call your  doctor right away if you find out you have done any of these activities. Do not take this medicine if you have used alcohol that evening. Do not take it if you have taken another medicine for sleep. °Do not take this medicine unless you are able to stay in bed for a full night (7 to 8 hours) and do not drive or perform other activities requiring full alertness within 8 hours of a dose. Do not drive, use machinery, or do anything that needs mental alertness the day after you take the 20 mg dose of this medicine. The use of lower doses (10 mg) may also cause driving impairment the next day. You may have a decrease in mental alertness the day after use, even if you feel that you are fully awake. Tell your doctor if you will need to perform activities requiring full alertness, such as driving, the next day. Do not stand or sit up quickly after taking this medicine, especially if you are an older patient. This reduces the risk of dizzy or fainting spells. °If you or your family notice any changes in your behavior, such as new or worsening depression, thoughts of harming yourself, anxiety, other unusual or disturbing thoughts, or memory loss, call your health care professional right away. °After you stop taking this medicine, you may have trouble falling asleep. This is called rebound insomnia. This problem usually goes away on its own after 1 or 2 nights. °What side effects may I notice from receiving this medicine? °Side effects that you should report to your doctor or health care professional as soon as possible: °· allergic reactions like skin rash, itching or hives, swelling of the face, lips, or tongue °· hallucinations °· periods of leg weakness lasting from seconds to a few minutes °· suicidal thoughts, mood changes °· unable to move or speak for several minutes while going to sleep or waking up °· unusual activities while not fully awake like driving, eating, making phone calls, or sexual activity °Side effects  that usually do not require medical attention (report these to your doctor or health care professional if they continue or are bothersome): °· daytime drowsiness °· headache °· nightmares or abnormal dreams °· tiredness °This list may not describe all possible side effects. Call your doctor for medical advice about side effects. You may report side effects to FDA at 1-800-FDA-1088. °Where should I keep my medicine? °Keep out of the reach of children. This medicine can be abused. Keep your medicine in a safe place to protect it from theft. Do not share this medicine with anyone. Selling or giving away this medicine is dangerous and against the law. °Store at room temperature between 15 and 30 degrees C (59 and 86 degrees F). Throw away any unused medicine after the expiration date. °NOTE: This sheet is a summary. It may not cover all possible information. If you have questions about this medicine, talk to your doctor, pharmacist, or health care provider. °© 2020 [employer] (2018-01-12 16:37:12) ° °

## 2019-12-27 ENCOUNTER — Telehealth: Payer: Self-pay

## 2019-12-27 NOTE — Telephone Encounter (Signed)
Medication management - Prior authorization for Belsomra completed online with CoverMyMeds and was approved.

## 2019-12-31 ENCOUNTER — Other Ambulatory Visit: Payer: Self-pay | Admitting: Neurology

## 2019-12-31 ENCOUNTER — Other Ambulatory Visit: Payer: Self-pay

## 2019-12-31 ENCOUNTER — Encounter: Payer: Self-pay | Admitting: Anesthesiology

## 2019-12-31 ENCOUNTER — Ambulatory Visit: Payer: Medicare Other | Attending: Anesthesiology | Admitting: Anesthesiology

## 2019-12-31 ENCOUNTER — Telehealth: Payer: Self-pay

## 2019-12-31 ENCOUNTER — Telehealth: Payer: Self-pay | Admitting: Pain Medicine

## 2019-12-31 DIAGNOSIS — F5101 Primary insomnia: Secondary | ICD-10-CM

## 2019-12-31 DIAGNOSIS — F1120 Opioid dependence, uncomplicated: Secondary | ICD-10-CM | POA: Diagnosis not present

## 2019-12-31 DIAGNOSIS — G894 Chronic pain syndrome: Secondary | ICD-10-CM

## 2019-12-31 DIAGNOSIS — M5441 Lumbago with sciatica, right side: Secondary | ICD-10-CM

## 2019-12-31 DIAGNOSIS — I639 Cerebral infarction, unspecified: Secondary | ICD-10-CM

## 2019-12-31 DIAGNOSIS — M5442 Lumbago with sciatica, left side: Secondary | ICD-10-CM | POA: Diagnosis not present

## 2019-12-31 DIAGNOSIS — G8929 Other chronic pain: Secondary | ICD-10-CM

## 2019-12-31 DIAGNOSIS — Z6841 Body Mass Index (BMI) 40.0 and over, adult: Secondary | ICD-10-CM

## 2019-12-31 DIAGNOSIS — M48062 Spinal stenosis, lumbar region with neurogenic claudication: Secondary | ICD-10-CM | POA: Diagnosis not present

## 2019-12-31 DIAGNOSIS — M5432 Sciatica, left side: Secondary | ICD-10-CM

## 2019-12-31 DIAGNOSIS — M47816 Spondylosis without myelopathy or radiculopathy, lumbar region: Secondary | ICD-10-CM

## 2019-12-31 DIAGNOSIS — M0600 Rheumatoid arthritis without rheumatoid factor, unspecified site: Secondary | ICD-10-CM

## 2019-12-31 DIAGNOSIS — M48061 Spinal stenosis, lumbar region without neurogenic claudication: Secondary | ICD-10-CM

## 2019-12-31 HISTORY — DX: Sciatica, left side: M54.32

## 2019-12-31 MED ORDER — OXYCODONE HCL 5 MG PO TABS
5.0000 mg | ORAL_TABLET | Freq: Four times a day (QID) | ORAL | 0 refills | Status: DC | PRN
Start: 2020-02-08 — End: 2020-04-09

## 2019-12-31 MED ORDER — OXYCODONE HCL 5 MG PO TABS
5.0000 mg | ORAL_TABLET | Freq: Four times a day (QID) | ORAL | 0 refills | Status: DC | PRN
Start: 2020-03-09 — End: 2020-04-09

## 2019-12-31 MED ORDER — BELSOMRA 15 MG PO TABS: 15.0000 mg | ORAL_TABLET | Freq: Every day | ORAL | 1 refills | Status: DC

## 2019-12-31 MED ORDER — OXYCODONE HCL 5 MG PO TABS: 5.0000 mg | ORAL_TABLET | Freq: Four times a day (QID) | ORAL | 0 refills | Status: DC | PRN

## 2019-12-31 NOTE — Telephone Encounter (Signed)
This patient has not had a visit in 3 months. Please  call her and give her an eval appointment .

## 2019-12-31 NOTE — Telephone Encounter (Signed)
Patient would like to come in for some injections like she had before

## 2019-12-31 NOTE — Progress Notes (Signed)
Virtual Visit via Telephone Note  I connected with Laura [name] on 12/31/19 at  2:20 PM EST by telephone and verified that I am speaking with the correct person using two identifiers.  Location: Patient: Home Provider: [facility] control center   I discussed the limitations, risks, security and privacy concerns of performing an evaluation and management service by telephone and the availability of in person appointments. I also discussed with the patient that there may be a patient responsible charge related to this service. The patient expressed understanding and agreed to proceed.   History of Present Illness:   I spoke with Laura [name] via telephone as she was unable to do the video portion of virtual conference and she reports that she is having a lot of pain running from the low back and her left calf and left foot.  She is also having some lack of coordination with ambulation regarding the left foot.  She was seen by Dr. Malvin Johns for neurology counseling and he is recommending an epidural for her regarding her left lower extremity symptoms and chronic low back pain.  She has had these in the past and reports that she had good success with them giving her 75 to 80% relief lasting generally several weeks to months before she have recurrence of the same pain.  She is also taking her pain medications effectively taking these 4 times a day she has been on this chronically with good success.  Unfortunately she is failed more conservative therapy.  She maintains that she gets good functional lifestyle provement with the medicines and no side effects reported.  Otherwise she is in her usual state of health today.  She she was recently ruled out by Dr. Malvin Johns for some strokelike symptoms but he feels that this is low probability and is okay for an epidural.  She is not on any blood thinners at this time Observations/Objective:  Current Outpatient Medications:  .  albuterol (VENTOLIN HFA) 108 (90 Base)  MCG/ACT inhaler, Inhale 2 puffs into the lungs every 6 (six) hours as needed for wheezing or shortness of breath., Disp: 18 g, Rfl: 3 .  aspirin EC 81 MG tablet, Take by mouth., Disp: , Rfl:  .  azelastine (ASTELIN) 0.1 % nasal spray, Place into the nose., Disp: , Rfl:  .  azithromycin (ZITHROMAX) 250 MG tablet, Take one tablet daily, Disp: 6 tablet, Rfl: 0 .  chlorthalidone (HYGROTON) 25 MG tablet, TAKE 1/2 TABLET (12.5MG ) BY MOUTH EVERY DAY, Disp: , Rfl: 11 .  Cholecalciferol (VITAMIN D) 2000 UNITS tablet, Take by mouth daily. , Disp: , Rfl:  .  DULoxetine (CYMBALTA) 60 MG capsule, Take 1 capsule (60 mg total) by mouth daily., Disp: 90 capsule, Rfl: 1 .  folic acid (FOLVITE) 1 MG tablet, Take 1 mg by mouth daily., Disp: , Rfl:  .  hydroxychloroquine (PLAQUENIL) 200 MG tablet, Take 100 mg by mouth daily. , Disp: , Rfl:  .  hydrOXYzine (VISTARIL) 25 MG capsule, Take 1-2 capsules (25-50 mg total) by mouth at bedtime as needed. For sleep, Disp: 180 capsule, Rfl: 1 .  inFLIXimab (REMICADE) 100 MG injection, Inject 100 mg into the vein every 8 (eight) weeks., Disp: , Rfl:  .  levothyroxine (SYNTHROID) 50 MCG tablet, Take 50 mcg by mouth at bedtime. , Disp: , Rfl:  .  levothyroxine (SYNTHROID, LEVOTHROID) 200 MCG tablet, TAKE 1 TABLET ONCE DAILY- ON AN EMPTY STOMACH WITH A GLASS OF WATER 30-60 MINUTES BEFORE BREAKFAST, Disp: , Rfl: 5 .  loratadine (CLARITIN) 10 MG tablet, Take 10 mg by mouth daily. , Disp: , Rfl:  .  methotrexate (RHEUMATREX) 2.5 MG tablet, TAKE 4 TABLETS (10 MG TOTAL) BY MOUTH EVERY 7 (SEVEN) DAYS WITH A MEAL, Disp: , Rfl: 5 .  metoprolol succinate (TOPROL-XL) 100 MG 24 hr tablet, Take 1 tablet (100 mg total) by mouth daily. Take with or immediately following a meal., Disp: 30 tablet, Rfl: 0 .  Multiple Vitamin (MULTI-VITAMINS) TABS, Take by mouth., Disp: , Rfl:  .  [START ON 01/09/2020] oxyCODONE (OXY IR/ROXICODONE) 5 MG immediate release tablet, Take 1 tablet (5 mg total) by mouth  every 6 (six) hours as needed for severe pain. Must last 30 days, Disp: 120 tablet, Rfl: 0 .  [START ON 02/08/2020] oxyCODONE (OXY IR/ROXICODONE) 5 MG immediate release tablet, Take 1 tablet (5 mg total) by mouth every 6 (six) hours as needed for severe pain., Disp: 120 tablet, Rfl: 0 .  [START ON 03/09/2020] oxyCODONE (OXY IR/ROXICODONE) 5 MG immediate release tablet, Take 1 tablet (5 mg total) by mouth every 6 (six) hours as needed for severe pain. Must last 30 days, Disp: 120 tablet, Rfl: 0 .  rosuvastatin (CRESTOR) 5 MG tablet, Take 5 mg by mouth 2 (two) times a week., Disp: , Rfl:  .  spironolactone (ALDACTONE) 25 MG tablet, TAKE 1/2 (ONE-HALF) TABLET BY MOUTH DAILY, Disp: , Rfl: 11 .  Suvorexant (BELSOMRA) 5 MG TABS, Take 5 mg by mouth at bedtime., Disp: 15 tablet, Rfl: 0 .  valACYclovir (VALTREX) 1000 MG tablet, PLEASE SEE ATTACHED FOR DETAILED DIRECTIONS, Disp: , Rfl:   Assessment and Plan: 1. Chronic pain syndrome   2. Chronic low back pain (1ry area of Pain) (Bilateral) (L>R)   3. Uncomplicated opioid dependence (HCC)   4. Spinal stenosis of lumbar region with neurogenic claudication   5. 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 Seronegative rheumatoid arthritis (HCC)   7. Morbid obesity with BMI of 40.0-44.9, adult (HCC)   8. Lumbar facet syndrome (Bilateral) (L>R)   9. Sciatica of left side   I have reviewed the [facility] practitioner database information is appropriate for refills dated for December 30 January 29 and February 28.  Based on our discussion today I will set her up for an epidural in approximately 1 month.  This is as requested by patient and Dr. Malvin Johns.  We gone over the risks and benefits of the procedure previously with Dr. Laban Emperor.  She understands the procedure she reports.  We will do this in approximately 1 month and her medications have been sent to pharmacy today.  I encouraged her to continue efforts at stretching strengthening and continue  follow-up with her primary care physicians in addition to Dr. Malvin Johns for further evaluation.  Follow Up Instructions:    I discussed the assessment and treatment plan with the patient. The patient was provided an opportunity to ask questions and all were answered. The patient agreed with the plan and demonstrated an understanding of the instructions.   The patient was advised to call back or seek an in-person evaluation if the symptoms worsen or if the condition fails to improve as anticipated.  I provided 30 minutes of non-face-to-face time during this encounter.   [name], MD

## 2019-12-31 NOTE — Telephone Encounter (Signed)
Agree with increasing Belsomra to 15 mg since that dose is working for patient.  We will send it to pharmacy.

## 2019-12-31 NOTE — Telephone Encounter (Signed)
Medication management - Patient left a message she had to take 3 of the 5 mg Belsomra to work but that it did work and questions if you will provide this for her or something else.

## 2020-01-01 NOTE — Telephone Encounter (Signed)
Medication management - Telephone call with pt to inform Dr. Elna Breslow approved her increase in Belsomra to 15 mg, one at bedtime but also informed her to not go up on the medication any further without first discussing with Dr. Elna Breslow and her approving.  Patient stated understanding these instructions and will call back if any problems filling new order for Belsomra 15 mg, one at bedtime.

## 2020-01-02 DIAGNOSIS — I639 Cerebral infarction, unspecified: Secondary | ICD-10-CM

## 2020-01-02 HISTORY — DX: Cerebral infarction, unspecified: I63.9

## 2020-01-09 ENCOUNTER — Ambulatory Visit: Payer: Medicare Other | Admitting: Pain Medicine
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
## 2020-01-13 ENCOUNTER — Ambulatory Visit: Payer: Medicare Other | Admitting: Pain Medicine

## 2020-01-13 NOTE — Patient Instructions (Incomplete)
______________________________________________________________________________________________  Body mass index (BMI)  Body mass index (BMI) is a common tool for deciding whether a person has an appropriate body weight.  It measures a persons weight in relation to their height.   According to the [employer] of health (NIH): Marland Kitchen A BMI of less than 18.5 means that a person is underweight. . A BMI of between 18.5 and 24.9 is ideal. . A BMI of between 25 and 29.9 is overweight. . A BMI over 30 indicates obesity.  Weight Management Required  URGENT: Your weight has been found to be adversely affecting your health.  Dear Laura [name]:  Your current Estimated body mass index is 46.59 kg/m as calculated from the following:   Height as of 10/29/19: '5\' 5"'  (1.651 m).   Weight as of 10/29/19: 280 lb (127 kg).  Please use the table below to identify your weight category and associated incidence of chronic pain, secondary to your weight.  Body Mass Index (BMI) Classification BMI level (kg/m2) Category Associated incidence of chronic pain  <18  Underweight   18.5-24.9 Ideal body weight   25-29.9 Overweight  20%  30-34.9 Obese (Class I)  68%  35-39.9 Severe obesity (Class II)  136%  >40 Extreme obesity (Class III)  254%   In addition: You will be considered "Morbidly Obese", if your BMI is above 30 and you have one or more of the following conditions which are known to be caused and/or directly associated with obesity: 1.    Type 2 Diabetes (Which in turn can lead to cardiovascular diseases (CVD), stroke, peripheral vascular diseases (PVD), retinopathy, nephropathy, and neuropathy) 2.    Cardiovascular Disease (High Blood Pressure; Congestive Heart Failure; High Cholesterol; Coronary Artery Disease; Angina; or History of Heart Attacks) 3.    Breathing problems (Asthma; obesity-hypoventilation syndrome; obstructive sleep apnea; chronic inflammatory airway disease; reactive airway disease;  or shortness of breath) 4.    Chronic kidney disease 5.    Liver disease (nonalcoholic fatty liver disease) 6.    High blood pressure 7.    Acid reflux (gastroesophageal reflux disease; heartburn) 8.    Osteoarthritis (OA) (with any of the following: hip pain; knee pain; and/or low back pain) 9.    Low back pain (Lumbar Facet Syndrome; and/or Degenerative Disc Disease) 10.  Hip pain (Osteoarthritis of hip) (For every 1 lbs of added body weight, there is a 2 lbs increase in pressure inside of each hip articulation. 1:2 mechanical relationship) 11.  Knee pain (Osteoarthritis of knee) (For every 1 lbs of added body weight, there is a 4 lbs increase in pressure inside of each knee articulation. 1:4 mechanical relationship) (patients with a BMI>30 kg/m2 were 6.8 times more likely to develop knee OA than normal-weight individuals) 12.  Cancer: Epidemiological studies have shown that obesity is a risk factor for: post-menopausal breast cancer; cancers of the endometrium, colon and kidney cancer; malignant adenomas of the oesophagus. Obese subjects have an approximately 1.5-3.5-fold increased risk of developing these cancers compared with normal-weight subjects, and it has been estimated that between 15 and 45% of these cancers can be attributed to overweight. More recent studies suggest that obesity may also increase the risk of other types of cancer, including pancreatic, hepatic and gallbladder cancer. (Ref: Obesity and cancer. Pischon T, Nthlings U, [employer] H. Proc Nutr Soc. 2008 May;67(2):128-45. doi: 39.5320/E3343568616837290.) The International Agency for Research on Cancer (IARC) has identified 13 cancers associated with overweight and obesity: meningioma, multiple myeloma, adenocarcinoma of the esophagus, and  cancers of the thyroid, postmenopausal breast cancer, gallbladder, stomach, liver, pancreas, kidney, ovaries, uterus, colon and rectal (colorectal) cancers. 63 percent of all cancers diagnosed in  women and 24 percent of those diagnosed in men are associated with overweight and obesity.  Recommendation: At this point it is urgent that you take a step back and concentrate in loosing weight. Dedicate 100% of your efforts on this task. Nothing else will improve your health more than bringing your weight down and your BMI to less than 30. If you are here, you probably have chronic pain. We know that most chronic pain patients have difficulty exercising secondary to their pain. For this reason, you must rely on proper nutrition and diet in order to lose the weight. If your BMI is above 40, you should seriously consider bariatric surgery. A realistic goal is to lose 10% of your body weight over a period of 12 months.  Be honest to yourself, if over time you have unsuccessfully tried to lose weight, then it is time for you to seek professional help and to enter a medically supervised weight management program, and/or undergo bariatric surgery. Stop procrastinating.   Pain management considerations:  1.    Pharmacological Problems: Be advised that the use of opioid analgesics (oxycodone; hydrocodone; morphine; methadone; codeine; and all of their derivatives) have been associated with decreased metabolism and weight gain.  For this reason, should we see that you are unable to lose weight while taking these medications, it may become necessary for [place] to taper down and indefinitely discontinue them.  2.    Technical Problems: The incidence of successful interventional therapies decreases as the patient's BMI increases. It is much more difficult to accomplish a safe and effective interventional therapy on a patient with a BMI above 35. 3.    Radiation Exposure Problems: The x-rays machine, used to accomplish injection therapies, will automatically increase their x-ray output in order to capture an appropriate bone image. This means that radiation exposure increases exponentially with the patient's BMI. (The  higher the BMI, the higher the radiation exposure.) Although the level of radiation used at a given time is still safe to the patient, it is not for the physician and/or assisting staff. Unfortunately, radiation exposure is accumulative. Because physicians and the staff have to do procedures and be exposed on a daily basis, this can result in health problems such as cancer and radiation burns. Radiation exposure to the staff is monitored by the radiation batches that they wear. The exposure levels are reported back to the staff on a quarterly basis. Depending on levels of exposure, physicians and staff may be obligated by law to decrease this exposure. This means that they have the right and obligation to refuse providing therapies where they may be overexposed to radiation. For this reason, physicians may decline to offer therapies such as radiofrequency ablation or implants to patients with a BMI above 40. 4.    Current Trends: Be advised that the current trend is to no longer offer certain therapies to patients with a BMI equal to, or above 35, due to increase perioperative risks, increased technical procedural difficulties, and excessive radiation exposure to healthcare personnel.  ______________________________________________________________________________________________

## 2020-01-16 ENCOUNTER — Other Ambulatory Visit: Payer: Self-pay

## 2020-01-16 ENCOUNTER — Ambulatory Visit
Admission: RE | Admit: 2020-01-16 | Discharge: 2020-01-16 | Disposition: A | Discharge status: Home / Self Care | Payer: Medicare Other | Source: Ambulatory Visit | Attending: Neurology | Admitting: Neurology

## 2020-01-16 DIAGNOSIS — I639 Cerebral infarction, unspecified: Secondary | ICD-10-CM | POA: Insufficient documentation

## 2020-01-16 IMAGING — MR MR HEAD W/O CM
11 series · 43 of 48 positions shown · non-contrast
Comparison: None.

CLINICAL DATA: Evaluation for stroke. Left foot drop. Hip pain.
Prior fall.

EXAM:
MRI HEAD WITHOUT CONTRAST
TECHNIQUE: Multiplanar, multiecho pulse sequences of the brain and surrounding
structures were obtained without intravenous contrast.

[Series 5: ax dwi_tracew · axial · 3.0mm · 0.60mm/px · z∈[-120,+39]mm · 5 of 50 slices shown]
[im 1/50]
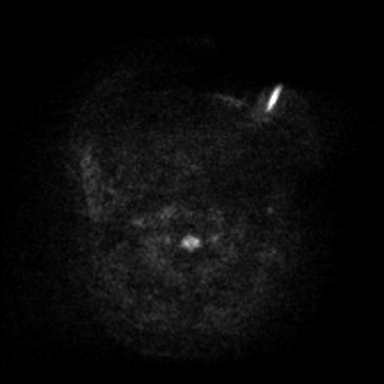
[im 13/50]
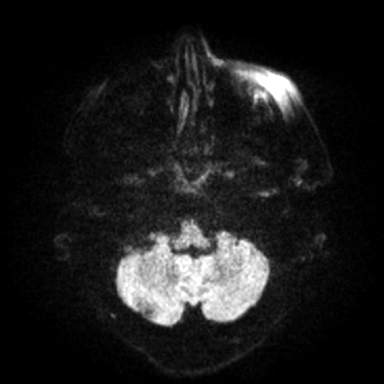
[im 25/50]
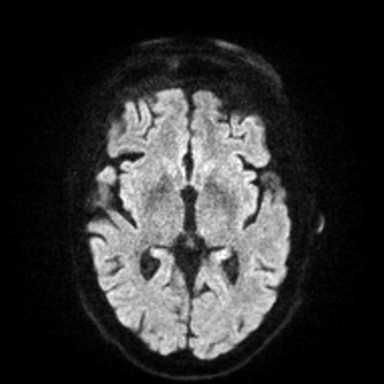
[im 37/50]
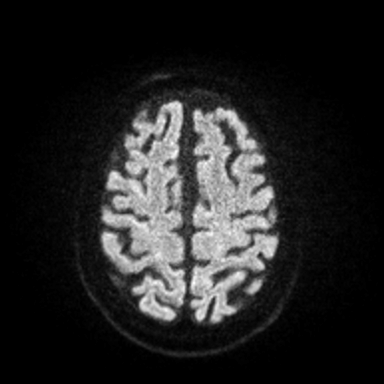
[im 50/50]
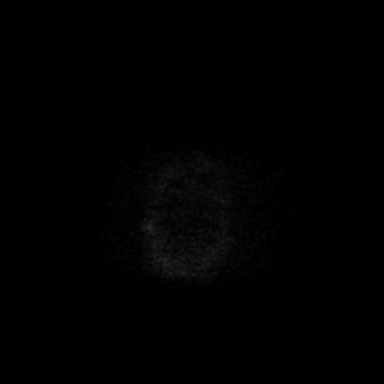

[Series 6: ax dwi_adc · axial · 3.0mm · 0.60mm/px · z∈[-120,+36]mm · 4 of 49 slices shown]
[im 1/49]
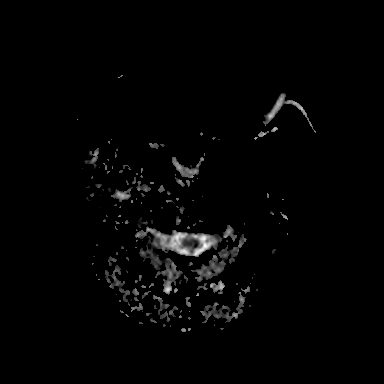
[im 17/49]
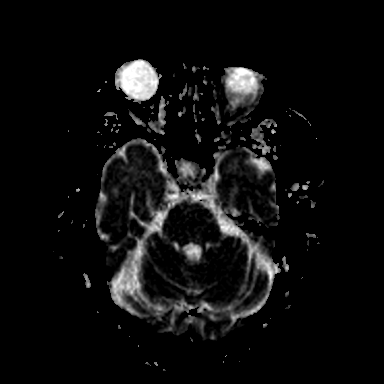
[im 33/49]
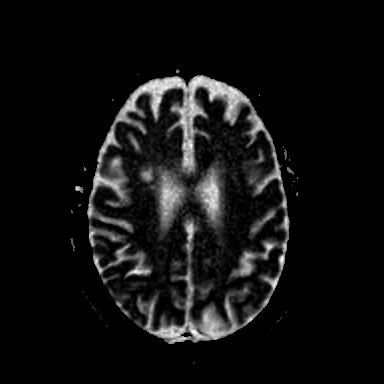
[im 49/49]
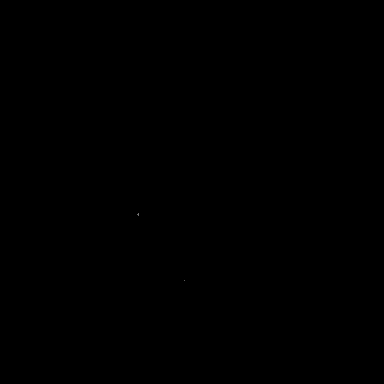

[Series 7: cor dwi_tracew · coronal · 5.0mm · 0.60mm/px · 3 of 40 slices shown]
[im 1/40]
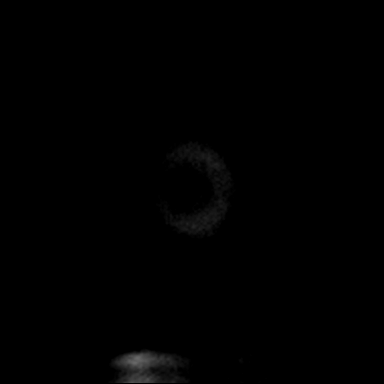
[im 20/40]
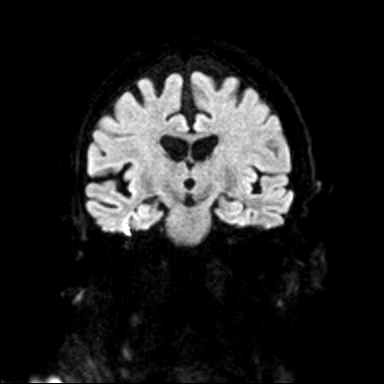
[im 40/40]
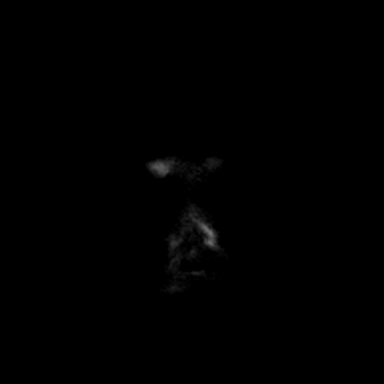

[Series 8: cor dwi_adc · coronal · 5.0mm · 0.60mm/px · 3 of 36 slices shown]
[im 1/36]
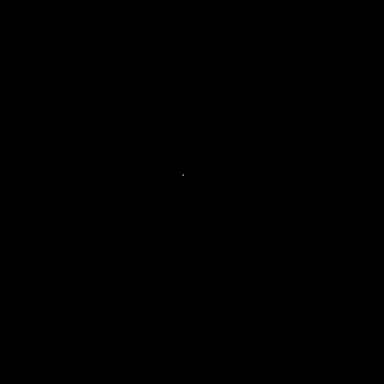
[im 18/36]
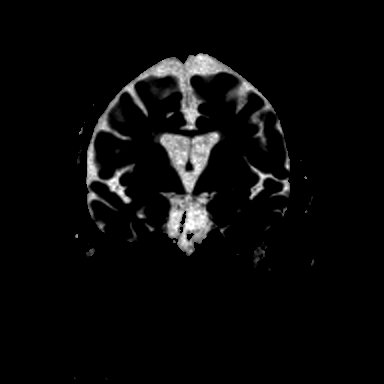
[im 36/36]
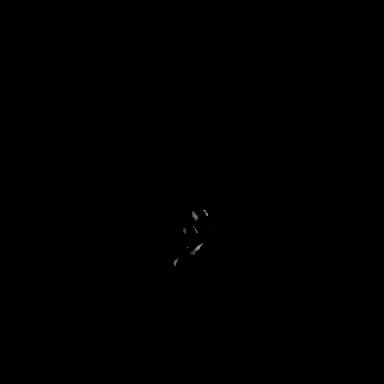

[Series 9: T1 · sagittal · 5.0mm · 0.62mm/px · 2 of 25 slices shown (1 of 2)]
[im 1/25]
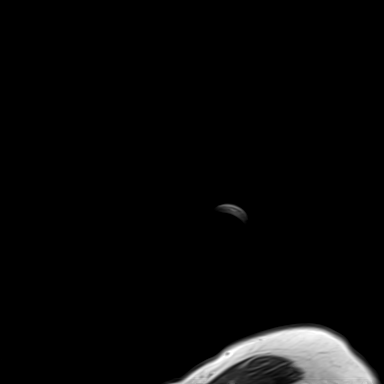
[im 25/25]
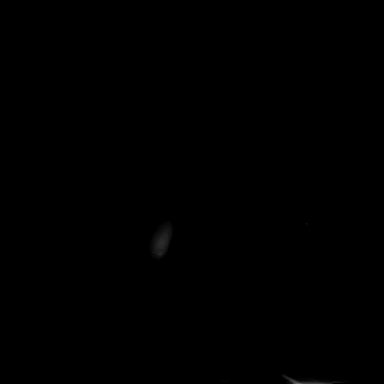

[Series 10: T2 · axial · 5.0mm · 0.53mm/px · z∈[-120,+39]mm · 2 of 28 slices shown (1 of 2)]
[im 1/28]
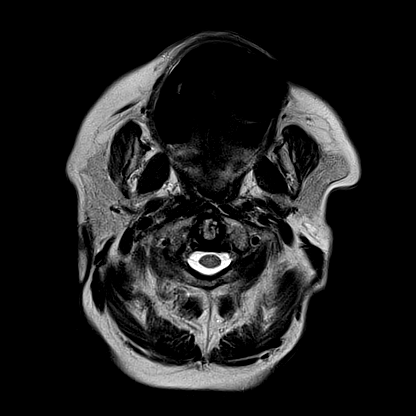
[im 28/28]
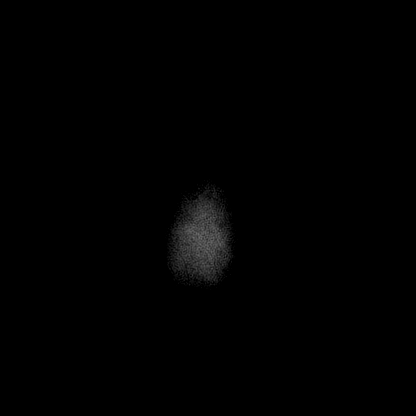

[Series 12: pha_images · axial · 3.0mm · 0.90mm/px · z∈[-124,+45]mm · 4 of 56 slices shown]
[im 1/56]
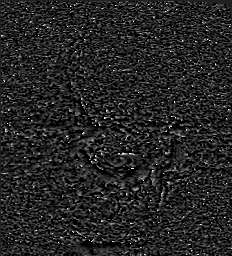
[im 19/56]
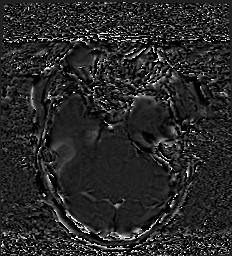
[im 37/56]
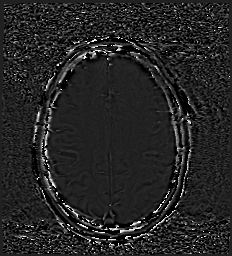
[im 56/56]
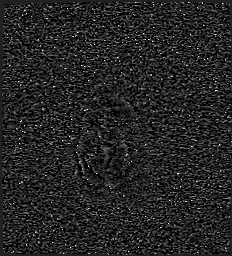

[Series 13: swi_images · axial · 3.0mm · 0.90mm/px · z∈[-127,+48]mm · 5 of 60 slices shown]
[im 1/60]
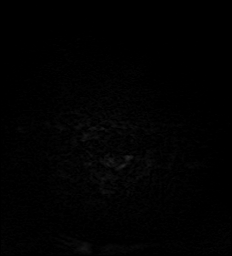
[im 15/60]
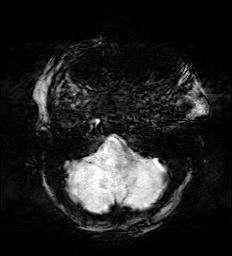
[im 30/60]
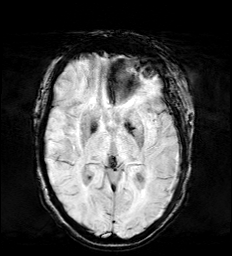
[im 45/60]
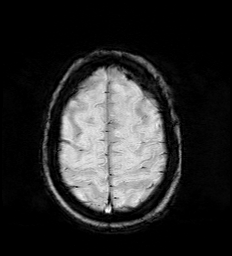
[im 60/60]
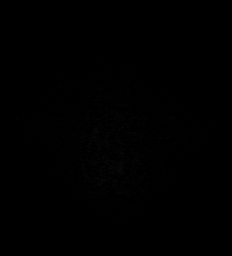

[Series 15: FLAIR · axial · 3.0mm · 0.53mm/px · z∈[-120,+39]mm · 4 of 55 slices shown]
[im 1/55]
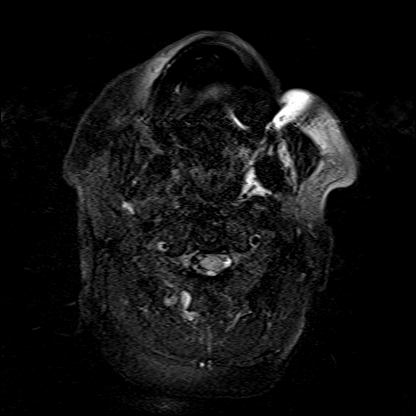
[im 19/55]
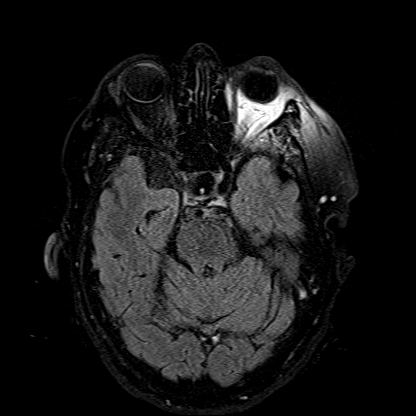
[im 37/55]
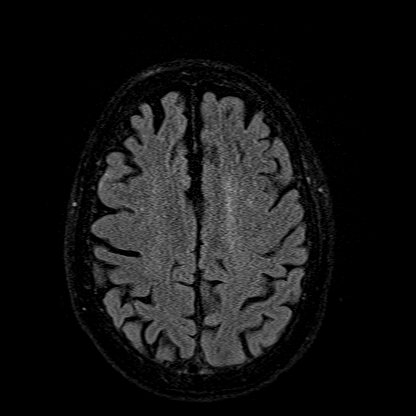
[im 55/55]
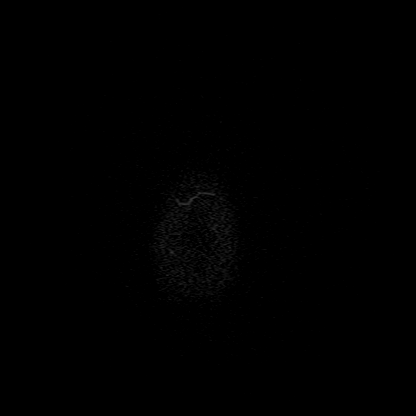

[Series 16: T1 · axial · 1.0mm · 0.98mm/px · z∈[-125,+47]mm · 9 of 176 slices shown (2 of 2)]
[im 1/176]
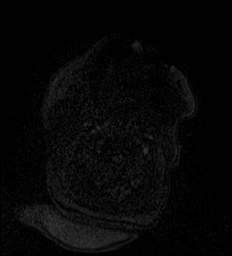
[im 14/176]
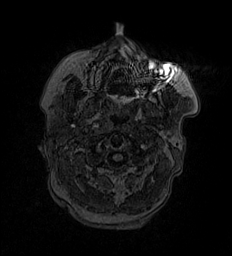
[im 27/176]
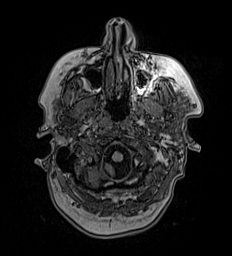
[im 54/176]
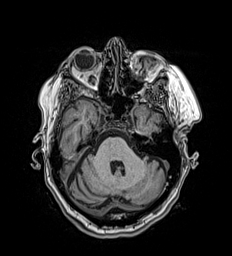
[im 81/176]
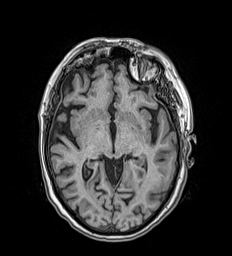
[im 95/176]
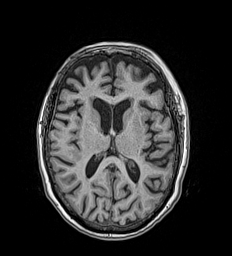
[im 122/176]
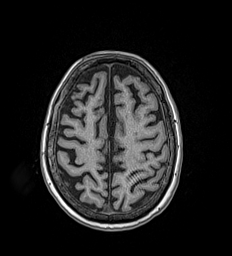
[im 149/176]
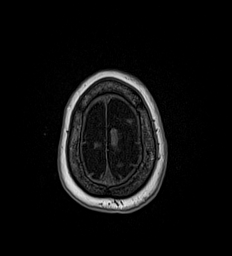
[im 176/176]
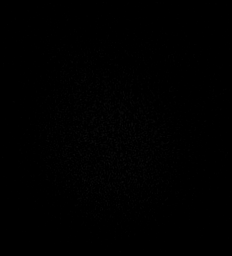

[Series 17: T2 · coronal · 5.0mm · 0.57mm/px · 2 of 30 slices shown (2 of 2)]
[im 1/30]
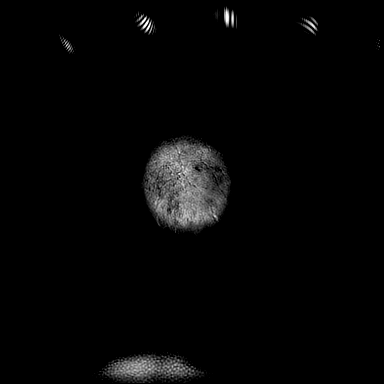
[im 30/30]
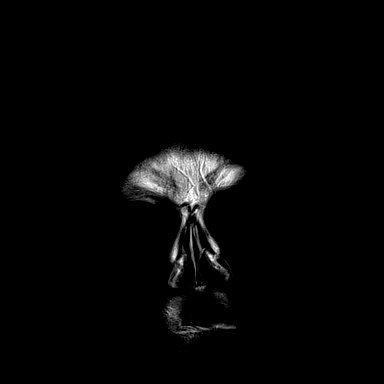

[43 of 48 positions shown; findings below may reference images not displayed]

FINDINGS: Brain: There is no evidence of an acute infarct, intracranial
hemorrhage, mass, midline shift, or extra-axial fluid collection. T2
hyperintensities in the cerebral white matter bilaterally are
nonspecific but compatible with mild chronic small vessel ischemic
disease. There is a chronic lacunar infarct in the right corona
radiata. There is mild generalized cerebral atrophy. A mildly
enlarged partially empty sella is noted and likely incidental.

Vascular: Major intracranial vascular flow voids are preserved.

Skull and upper cervical spine: Unremarkable bone marrow signal.

Sinuses/Orbits: Bilateral cataract extraction. Trace left mastoid
effusion. Clear paranasal sinuses.

Other: None.
IMPRESSION: 1. No acute intracranial abnormality.
2. Mild chronic small vessel ischemic disease and cerebral atrophy.

## 2020-01-19 NOTE — Progress Notes (Unsigned)
PROVIDER NOTE: Information contained herein reflects review and annotations entered in association with encounter. Interpretation of such information and data should be left to medically-trained personnel. Information provided to patient can be located elsewhere in the medical record under "Patient Instructions". Document created using STT-dictation technology, any transcriptional errors that may result from process are unintentional.    Patient: Laura [name]  Service Category: E/M  Provider: Gaspar Cola, MD  DOB: 06-11-1945  DOS: 01/20/2020  Specialty: Interventional Pain Management  MRN: [id number]  Setting: Ambulatory outpatient  PCP: Adin Hector, MD  Type: Established Patient    Referring Provider: Adin Hector, MD  Location: Office  Delivery: Face-to-face     HPI  Laura [name], a 75 y.o. year old female, is here today because of her Spinal stenosis, lumbar region, with neurogenic claudication [M48.062]. Laura [name]'s primary complain today is Back Pain (Lower back) Last encounter: My last encounter with her was on 01/13/2020. 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 Pain Assessment:  Severity of Chronic pain is reported as a 6 /10. Location: Back Lower/pain radiaities down buttock to the side of both ghip. Onset: More than a month ago. Quality: Throbbing,Nagging,Constant,Aching. Timing: Constant. Modifying factor(s): warm heat and meds. Vitals:  height is _0  (1.651 m) and weight is 270 lb (122.5 kg). Her temperature is 96.6 F (35.9 C) (abnormal). Her blood pressure is 108/96 (abnormal) and her pulse is 72. Her oxygen saturation is 100%.   Reason for encounter: new problems.  The patient comes into the clinic today on a wheelchair indicating that her spinal stenosis as being causing problems with her left lower extremity to the point where now has a dropfoot on the left side.  According to the MRI the patient has some severe spinal stenosis at the L3-4 and L4-5 level.  We will go ahead and have the patient come in for a lumbar epidural steroid injection under fluoroscopic guidance and IV sedation on the left side.  We will need to stop her Plaquenil for 7 days prior to the intraspinal procedure.  Pharmacotherapy Assessment   Analgesic: Oxycodone IR 5 mg every 6 hours (20 mg/day) MME/day:30 mg/day.   Monitoring: [place] PMP: PDMP reviewed during this encounter.       Pharmacotherapy: No side-effects or adverse reactions reported. Compliance: No problems identified. Effectiveness: Clinically acceptable.  Chauncey Fischer, RN  01/20/2020  1:44 PM  Sign when Signing Visit Safety precautions to be maintained throughout the outpatient stay will include: orient to surroundings, keep bed in low position, maintain call bell within reach at all times, provide assistance with transfer out of bed and ambulation.     UDS:  Summary  Date Value Ref Range Status  07/16/2019 Note  Final    Comment:    ==================================================================== [employer] [facility] (MW) ==================================================================== Test  Result       Flag       Units  Drug Present and Declared for Prescription Verification   Oxycodone                      1635         EXPECTED   ng/mg creat   Oxymorphone                    521          EXPECTED   ng/mg creat   Noroxycodone                   1941         EXPECTED   ng/mg creat   Noroxymorphone                 174          EXPECTED   ng/mg creat    Sources of oxycodone are scheduled prescription medications.    Oxymorphone, noroxycodone, and noroxymorphone are expected    metabolites of oxycodone. Oxymorphone is also available as a    scheduled prescription medication.  Drug Present not Declared for Prescription Verification   Tramadol                       136          UNEXPECTED ng/mg creat   O-Desmethyltramadol            53           UNEXPECTED ng/mg creat   N-Desmethyltramadol            77           UNEXPECTED ng/mg creat    Source of tramadol is a prescription medication. O-desmethyltramadol    and N-desmethyltramadol are expected metabolites of tramadol.  ==================================================================== Test                      Result    Flag   Units      Ref Range   Creatinine              103              mg/dL      >=20 ==================================================================== Declared Medications:  The flagging and interpretation on this report are based on the  following declared medications.  Unexpected results may arise from  inaccuracies in the declared medications.   **Note: The testing scope of this panel includes these medications:   Oxycodone   **Note: The testing scope of this panel does not include the  following reported medications:   Albuterol  Amlodipine  Aspirin  Azelastine  Chlorthalidone  Duloxetine  Folic Acid  Hydroxychloroquine (Plaquenil)  Infliximab (Remicade)  Levothyroxine  Loratadine (Claritin)  Methotrexate  Metoprolol  Mirtazapine  Multivitamin  Rosuvastatin  Simvastatin  (Zocor)  Spironolactone  Valacyclovir (Valtrex) ==================================================================== For clinical consultation, please call [phone]. ====================================================================      ROS  Constitutional: Denies any fever or chills Gastrointestinal: No reported hemesis, hematochezia, vomiting, or acute GI distress Musculoskeletal: Denies any acute onset joint swelling, redness, loss of ROM, or weakness Neurological: No reported episodes of acute onset apraxia, aphasia, dysarthria, agnosia, amnesia, paralysis, loss of coordination, or loss of consciousness  Medication Review  DULoxetine, Multi-Vitamins, Suvorexant, Vitamin D, albuterol, aspirin EC, azelastine, chlorthalidone, folic acid, hydrOXYzine, hydroxychloroquine, inFLIXimab, levothyroxine, loratadine, methotrexate, metoprolol  succinate, oxyCODONE, rosuvastatin, spironolactone, and valACYclovir  History Review  Allergy: Laura [name] is allergic to bupropion and penicillins. Drug: Laura [name]  reports no history of drug use. Alcohol:  reports no history of alcohol use. Tobacco:  reports that she has never smoked. She has never used smokeless tobacco. Social: Laura [name]  reports that she has never smoked. She has never used smokeless tobacco. She reports that she does not drink alcohol and does not use drugs. Medical:  has a past medical history of Allergy, Anxiety, Arthritis, degenerative (10/08/2013), Asthma, Chronic hand pain, left (10/10/2019), Chronic kidney disease, Depression, Lumbar spinal stenosis with neurogenic claudication (11/07/2014), Major depression, single episode, in complete remission ([place]) (06/25/2015), Memory loss, short term (03/19/2014), Sciatica of left side (12/31/2019), Seizure ([place]) (10/07/2014), Sleep apnea, Sleep apnea, and Thyroid disease. Surgical: Laura [name]  has a past surgical history that includes Abdominal hysterectomy; Cesarean section;  Replacement total knee (Left); and Hip surgery (Right). Family: family history includes Alcohol abuse in her father; Depression in her father and sister; Heart attack in her father; Hypertension in her mother and sister; Post-traumatic stress disorder in her father; Rheum arthritis in her sister; Stroke in her mother.  Laboratory Chemistry Profile   Renal Lab Results  Component Value Date   BUN 24 (H) 10/03/2019   CREATININE 1.10 (H) 10/03/2019   GFRAA 57 (L) 10/03/2019   GFRNONAA 49 (L) 10/03/2019     Hepatic Lab Results  Component Value Date   AST 23 10/03/2019   ALT 16 10/03/2019   ALBUMIN 4.4 10/03/2019   ALKPHOS 55 10/03/2019     Electrolytes Lab Results  Component Value Date   NA 136 10/03/2019   K 4.2 10/03/2019   CL 100 10/03/2019   CALCIUM 9.0 10/03/2019   MG 2.3 10/08/2018     Bone Lab Results  Component Value Date   25OHVITD1 36 07/15/2015   25OHVITD2 3.6 07/15/2015   25OHVITD3 32 07/15/2015     Inflammation (CRP: Acute Phase) (ESR: Chronic Phase) Lab Results  Component Value Date   CRP 1.9 (H) 07/15/2015   ESRSEDRATE 32 (H) 07/15/2015   LATICACIDVEN 1.4 10/07/2018       Note: Above Lab results reviewed.  Recent Imaging Review  MR BRAIN WO CONTRAST CLINICAL DATA:  Evaluation for stroke. Left foot drop. Hip pain. Prior fall.  EXAM: MRI HEAD WITHOUT CONTRAST  TECHNIQUE: Multiplanar, multiecho pulse sequences of the brain and surrounding structures were obtained without intravenous contrast.  COMPARISON:  None.  FINDINGS: Brain: There is no evidence of an acute infarct, intracranial hemorrhage, mass, midline shift, or extra-axial fluid collection. T2 hyperintensities in the cerebral white matter bilaterally are nonspecific but compatible with mild chronic small vessel ischemic disease. There is a chronic lacunar infarct in the right corona radiata. There is mild generalized cerebral atrophy. A mildly enlarged partially empty sella is noted  and likely incidental.  Vascular: Major intracranial vascular flow voids are preserved.  Skull and upper cervical spine: Unremarkable bone marrow signal.  Sinuses/Orbits: Bilateral cataract extraction. Trace left mastoid effusion. Clear paranasal sinuses.  Other: None.  IMPRESSION: 1. No acute intracranial abnormality. 2. Mild chronic small vessel ischemic disease and cerebral atrophy.  Electronically Signed   By: Logan Bores M.D.   On: 01/16/2020 17:00 Note: Reviewed        Physical Exam  General appearance: Well nourished, well developed, and well hydrated. In no apparent acute distress Mental status: Alert, oriented x 3 (person, place, & time)  Respiratory: No evidence of acute respiratory distress Eyes: PERLA Vitals: BP (!) 108/96   Pulse 72   Temp (!) 96.6 F (35.9 C)   Ht _0  (1.651 m)   Wt 270 lb (122.5 kg)   SpO2 100%   BMI 44.93 kg/m  BMI: Estimated body mass index is 44.93 kg/m as calculated from the following:   Height as of this encounter: _1  (1.651 m).   Weight as of this encounter: 270 lb (122.5 kg). Ideal: Ideal body weight: 57 kg (125 lb 10.6 oz) Adjusted ideal body weight: 83.2 kg (183 lb 6.4 oz)  Assessment   Status Diagnosis  Controlled Controlled Controlled 1. 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 Chronic low back pain (1ry area of Pain) (Bilateral) (L>R)   3. 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 Lumbar spondylosis   5. Chronic anticoagulation (Plaquenil)      Updated Problems: Problem  Lumbar spinal stenosis (5 mm Severe L3-4; 8 mm L4-5) w/ neurogenic claudication   L3-4 prominent annular bulge with facet hypertrophy, hypertrophy of the ligamentum flavum, leading to a severe spinal stenosis of 5 mm with bilateral prominent neural foraminal narrowing. L4-5 annular disc bulge with left paracentral disc protrusion, facet hypertrophy, and central spinal stenosis of 8  mm. Severe flattening of the left lateral recess with narrowing of the left neuroforamen & moderate narrowing of the right neural foramen. L5-S1 annular bulge with facet hypertrophy and moderate bilateral neural foraminal narrowing.   Chronic anticoagulation (Plaquenil)   Stop for 7 to 11 days prior to intraspinal procedures     Plan of Care  Problem-specific:  No problem-specific Assessment & Plan notes found for this encounter.  Laura [name] has a current medication list which includes the following long-term medication(s): albuterol, azelastine, chlorthalidone, duloxetine, infliximab, levothyroxine, levothyroxine, loratadine, metoprolol succinate, oxycodone, [START ON 02/08/2020] oxycodone, [START ON 03/09/2020] oxycodone, and spironolactone.  Pharmacotherapy (Medications Ordered): No orders of the defined types were placed in this encounter.  Orders:  Orders Placed This Encounter  Procedures  . Lumbar Epidural Injection    Standing Status:   Future    Standing Expiration Date:   02/20/2020    Scheduling Instructions:     Procedure: Interlaminar Lumbar Epidural Steroid injection (LESI)  L4-5     Laterality: Left-sided     Sedation: Patient's choice.     Timeframe: ASAA    Order Specific Question:   Where will this procedure be performed?    Answer:   [facility]  . Blood Thinner Instructions to Nursing    Always make sure patient has clearance from prescribing physician to stop blood thinners for interventional therapies. If the patient requires a Lovenox-bridge therapy, make sure arrangements are made to institute it with the assistance of the PCP.    Scheduling Instructions:     Have Laura [name] stop the Plaquenil (Hydroxychloroquine) x 11 days prior to procedure or surgery.   Follow-up plan:   Return for Procedure (no sedation): (L) L4-5 LESI #1, (Blood Thinner Protocol).      Considering:   Therapeutic right IA Hyalgan knee injections  Diagnostic left hip  IA joint injection  Possible left hip RFA  Diagnostic right IA knee joint injection (w/ steroid)  Diagnostic bilateral genicular NB  Possible bilateral genicular nerve RFA  Diagnostic bilateral lumbar facet block  Possible bilateral lumbar facet RFA  Diagnostic bilateral L3 and/or L5 TFESI  Diagnostic L3-4 vs L4-5 LESI    Palliative PRN treatment(s):   Therapeutic right IA Hyalgan knee injections Palliative left IA hip joint injection  Diagnostic left lumbar facet block #2       Recent Visits Date Type Provider Dept  12/31/19 Telemedicine Molli Barrows, MD [facility]  Showing recent visits within past 90 days and meeting all other requirements Today's Visits Date Type Provider Dept  01/20/20 Office Visit Milinda Pointer, MD [facility]  Showing today's visits and meeting all other requirements Future Appointments Date Type Provider Dept  01/28/20 Appointment Milinda Pointer, MD [facility]  Showing future appointments within next 90 days and meeting all other requirements  I discussed the assessment and treatment plan with the patient. The patient was provided an opportunity to ask questions and all were answered. The patient agreed with the plan and demonstrated an understanding of the instructions.  Patient advised to call back or seek an in-person evaluation if the symptoms or condition worsens.  Duration of encounter: 30 minutes.  Note by: Gaspar Cola, MD Date: 01/20/2020; Time: 3:06 PM

## 2020-01-20 ENCOUNTER — Encounter: Payer: Self-pay | Admitting: Pain Medicine

## 2020-01-20 ENCOUNTER — Ambulatory Visit: Payer: Medicare Other | Attending: Pain Medicine | Admitting: Pain Medicine

## 2020-01-20 ENCOUNTER — Other Ambulatory Visit: Payer: Self-pay

## 2020-01-20 VITALS — BP 108/96 | HR 72 | Temp 96.6°F | Ht 65.0 in | Wt 270.0 lb

## 2020-01-20 DIAGNOSIS — M48062 Spinal stenosis, lumbar region with neurogenic claudication: Secondary | ICD-10-CM

## 2020-01-20 DIAGNOSIS — I639 Cerebral infarction, unspecified: Secondary | ICD-10-CM | POA: Diagnosis not present

## 2020-01-20 DIAGNOSIS — M5442 Lumbago with sciatica, left side: Secondary | ICD-10-CM | POA: Diagnosis present

## 2020-01-20 DIAGNOSIS — Z7901 Long term (current) use of anticoagulants: Secondary | ICD-10-CM

## 2020-01-20 DIAGNOSIS — M48061 Spinal stenosis, lumbar region without neurogenic claudication: Secondary | ICD-10-CM

## 2020-01-20 DIAGNOSIS — M47816 Spondylosis without myelopathy or radiculopathy, lumbar region: Secondary | ICD-10-CM | POA: Diagnosis present

## 2020-01-20 DIAGNOSIS — G894 Chronic pain syndrome: Secondary | ICD-10-CM | POA: Diagnosis present

## 2020-01-20 DIAGNOSIS — G8929 Other chronic pain: Secondary | ICD-10-CM | POA: Diagnosis present

## 2020-01-20 DIAGNOSIS — M5441 Lumbago with sciatica, right side: Secondary | ICD-10-CM

## 2020-01-20 NOTE — Progress Notes (Signed)
Safety precautions to be maintained throughout the outpatient stay will include: orient to surroundings, keep bed in low position, maintain call bell within reach at all times, provide assistance with transfer out of bed and ambulation.  

## 2020-01-20 NOTE — Patient Instructions (Addendum)
____________________________________________________________________________________________  Preparing for your procedure (without sedation)  Procedure appointments are limited to planned procedures: . No Prescription Refills. . No disability issues will be discussed. . No medication changes will be discussed.  Instructions: . Oral Intake: Do not eat or drink anything for at least 6 hours prior to your procedure. (Exception: Blood Pressure Medication. See below.) . Transportation: Unless otherwise stated by your physician, you may drive yourself after the procedure. . Blood Pressure Medicine: Do not forget to take your blood pressure medicine with a sip of water the morning of the procedure. If your Diastolic (lower reading)is above 100 mmHg, elective cases will be cancelled/rescheduled. . Blood thinners: These will need to be stopped for procedures. Notify our staff if you are taking any blood thinners. Depending on which one you take, there will be specific instructions on how and when to stop it. . Diabetics on insulin: Notify the staff so that you can be scheduled 1st case in the morning. If your diabetes requires high dose insulin, take only  of your normal insulin dose the morning of the procedure and notify the staff that you have done so. . Preventing infections: Shower with an antibacterial soap the morning of your procedure.  . Build-up your immune system: Take 1000 mg of Vitamin C with every meal (3 times a day) the day prior to your procedure. . Antibiotics: Inform the staff if you have a condition or reason that requires you to take antibiotics before dental procedures. . Pregnancy: If you are pregnant, call and cancel the procedure. . Sickness: If you have a cold, fever, or any active infections, call and cancel the procedure. . Arrival: You must be in the facility at least 30 minutes prior to your scheduled procedure. . Children: Do not bring any children with you. . Dress  appropriately: Bring dark clothing that you would not mind if they get stained. . Valuables: Do not bring any jewelry or valuables.  Reasons to call and reschedule or cancel your procedure: (Following these recommendations will minimize the risk of a serious complication.) . Surgeries: Avoid having procedures within 2 weeks of any surgery. (Avoid for 2 weeks before or after any surgery). . Flu Shots: Avoid having procedures within 2 weeks of a flu shots or . (Avoid for 2 weeks before or after immunizations). . Barium: Avoid having a procedure within 7-10 days after having had a radiological study involving the use of radiological contrast. (Myelograms, Barium swallow or enema study). . Heart attacks: Avoid any elective procedures or surgeries for the initial 6 months after a "Myocardial Infarction" (Heart Attack). . Blood thinners: It is imperative that you stop these medications before procedures. Let us know if you if you take any blood thinner.  . Infection: Avoid procedures during or within two weeks of an infection (including chest colds or gastrointestinal problems). Symptoms associated with infections include: Localized redness, fever, chills, night sweats or profuse sweating, burning sensation when voiding, cough, congestion, stuffiness, runny nose, sore throat, diarrhea, nausea, vomiting, cold or Flu symptoms, recent or current infections. It is specially important if the infection is over the area that we intend to treat. . Heart and lung problems: Symptoms that may suggest an active cardiopulmonary problem include: cough, chest pain, breathing difficulties or shortness of breath, dizziness, ankle swelling, uncontrolled high or unusually low blood pressure, and/or palpitations. If you are experiencing any of these symptoms, cancel your procedure and contact your primary care physician for an evaluation.  Remember:  Regular   Business hours are:  Monday to Thursday 8:00 AM to 4:00  PM  Provider's Schedule: Delano Metz, MD:  Procedure days: Tuesday and Thursday 7:30 AM to 4:00 PM  Edward Jolly, MD:  Procedure days: Monday and Wednesday 7:30 AM to 4:00 PM ____________________________________________________________________________________________   ____________________________________________________________________________________________  Blood Thinners  IMPORTANT NOTICE:  If you take any of these, make sure to notify the nursing staff.  Failure to do so may result in injury.  Recommended time intervals to stop and restart blood-thinners, before & after invasive procedures  Generic Name Brand Name Stop Time. Must be stopped at least this long before procedures. After procedures, wait at least this long before re-starting.  Abciximab Reopro 15 days 2 hrs  Alteplase Activase 10 days 10 days  Anagrelide Agrylin    Apixaban Eliquis 3 days 6 hrs  Cilostazol Pletal 3 days 5 hrs  Clopidogrel Plavix 7-10 days 2 hrs  Dabigatran Pradaxa 5 days 6 hrs  Dalteparin Fragmin 24 hours 4 hrs  Dipyridamole Aggrenox 11days 2 hrs  Edoxaban Lixiana; Savaysa 3 days 2 hrs  Enoxaparin  Lovenox 24 hours 4 hrs  Eptifibatide Integrillin 8 hours 2 hrs  Fondaparinux  Arixtra 72 hours 12 hrs  Prasugrel Effient 7-10 days 6 hrs  Reteplase Retavase 10 days 10 days  Rivaroxaban Xarelto 3 days 6 hrs  Ticagrelor Brilinta 5-7 days 6 hrs  Ticlopidine Ticlid 10-14 days 2 hrs  Tinzaparin Innohep 24 hours 4 hrs  Tirofiban Aggrastat 8 hours 2 hrs  Warfarin Coumadin 5 days 2 hrs   Other medications with blood-thinning effects  Product indications Generic (Brand) names Note  Cholesterol Lipitor Stop 4 days before procedure  Blood thinner (injectable) Heparin (LMW or LMWH Heparin) Stop 24 hours before procedure  Cancer Ibrutinib (Imbruvica) Stop 7 days before procedure  Malaria/Rheumatoid Hydroxychloroquine (Plaquenil) Stop 11 days before procedure  Thrombolytics  10 days before or  after procedures   Over-the-counter (OTC) Products with blood-thinning effects  Product Common names Stop Time  Aspirin > 325 mg Goody Powders, Excedrin, etc. 11 days  Aspirin ? 81 mg  7 days  Fish oil  4 days  Garlic supplements  7 days  Ginkgo biloba  36 hours  Ginseng  24 hours  NSAIDs Ibuprofen, Naprosyn, etc. 3 days  Vitamin E  4 days   ____________________________________________________________________________________________ ____________________________________________________________________________________________  Blood Thinners  IMPORTANT NOTICE:  If you take any of these, make sure to notify the nursing staff.  Failure to do so may result in injury.  Recommended time intervals to stop and restart blood-thinners, before & after invasive procedures  Generic Name Brand Name Stop Time. Must be stopped at least this long before procedures. After procedures, wait at least this long before re-starting.  Abciximab Reopro 15 days 2 hrs  Alteplase Activase 10 days 10 days  Anagrelide Agrylin    Apixaban Eliquis 3 days 6 hrs  Cilostazol Pletal 3 days 5 hrs  Clopidogrel Plavix 7-10 days 2 hrs  Dabigatran Pradaxa 5 days 6 hrs  Dalteparin Fragmin 24 hours 4 hrs  Dipyridamole Aggrenox 11days 2 hrs  Edoxaban Lixiana; Savaysa 3 days 2 hrs  Enoxaparin  Lovenox 24 hours 4 hrs  Eptifibatide Integrillin 8 hours 2 hrs  Fondaparinux  Arixtra 72 hours 12 hrs  Prasugrel Effient 7-10 days 6 hrs  Reteplase Retavase 10 days 10 days  Rivaroxaban Xarelto 3 days 6 hrs  Ticagrelor Brilinta 5-7 days 6 hrs  Ticlopidine Ticlid 10-14 days 2 hrs  Tinzaparin Innohep 24 hours  4 hrs  Tirofiban Aggrastat 8 hours 2 hrs  Warfarin Coumadin 5 days 2 hrs   Other medications with blood-thinning effects  Product indications Generic (Brand) names Note  Cholesterol Lipitor Stop 4 days before procedure  Blood thinner (injectable) Heparin (LMW or LMWH Heparin) Stop 24 hours before procedure  Cancer  Ibrutinib (Imbruvica) Stop 7 days before procedure  Malaria/Rheumatoid Hydroxychloroquine (Plaquenil) Stop 11 days before procedure  Thrombolytics  10 days before or after procedures   Over-the-counter (OTC) Products with blood-thinning effects  Product Common names Stop Time  Aspirin > 325 mg Goody Powders, Excedrin, etc. 11 days  Aspirin ? 81 mg  7 days  Fish oil  4 days  Garlic supplements  7 days  Ginkgo biloba  36 hours  Ginseng  24 hours  NSAIDs Ibuprofen, Naprosyn, etc. 3 days  Vitamin E  4 days   ____________________________________________________________________________________________ Epidural Steroid Injection Patient Information  Description: The epidural space surrounds the nerves as they exit the spinal cord.  In some patients, the nerves can be compressed and inflamed by a bulging disc or a tight spinal canal (spinal stenosis).  By injecting steroids into the epidural space, we can bring irritated nerves into direct contact with a potentially helpful medication.  These steroids act directly on the irritated nerves and can reduce swelling and inflammation which often leads to decreased pain.  Epidural steroids may be injected anywhere along the spine and from the neck to the low back depending upon the location of your pain.   After numbing the skin with local anesthetic (like Novocaine), a small needle is passed into the epidural space slowly.  You may experience a sensation of pressure while this is being done.  The entire block usually last less than 10 minutes.  Conditions which may be treated by epidural steroids:   Low back and leg pain  Neck and arm pain  Spinal stenosis  Post-laminectomy syndrome  Herpes zoster (shingles) pain  Pain from compression fractures  Preparation for the injection:  1. Do not eat any solid food or dairy products within 8 hours of your appointment.  2. You may drink clear liquids up to 3 hours before appointment.  Clear liquids  include water, black coffee, juice or soda.  No milk or cream please. 3. You may take your regular medication, including pain medications, with a sip of water before your appointment  Diabetics should hold regular insulin (if taken separately) and take 1/2 normal NPH dos the morning of the procedure.  Carry some sugar containing items with you to your appointment. 4. A driver must accompany you and be prepared to drive you home after your procedure.  5. Bring all your current medications with your. 6. An IV may be inserted and sedation may be given at the discretion of the physician.   7. A blood pressure cuff, EKG and other monitors will often be applied during the procedure.  Some patients may need to have extra oxygen administered for a short period. 8. You will be asked to provide medical information, including your allergies, prior to the procedure.  We must know immediately if you are taking blood thinners (like Coumadin/Warfarin)  Or if you are allergic to IV iodine contrast (dye). We must know if you could possible be pregnant.  Possible side-effects:  Bleeding from needle site  Infection (rare, may require surgery)  Nerve injury (rare)  Numbness & tingling (temporary)  Difficulty urinating (rare, temporary)  Spinal headache ( a headache worse  with upright posture)  Light -headedness (temporary)  Pain at injection site (several days)  Decreased blood pressure (temporary)  Weakness in arm/leg (temporary)  Pressure sensation in back/neck (temporary)  Call if you experience:  Fever/chills associated with headache or increased back/neck pain.  Headache worsened by an upright position.  New onset weakness or numbness of an extremity below the injection site  Hives or difficulty breathing (go to the emergency room)  Inflammation or drainage at the infection site  Severe back/neck pain  Any new symptoms which are concerning to you  Please note:  Although the local  anesthetic injected can often make your back or neck feel good for several hours after the injection, the pain will likely return.  It takes 3-7 days for steroids to work in the epidural space.  You may not notice any pain relief for at least that one week.  If effective, we will often do a series of three injections spaced 3-6 weeks apart to maximally decrease your pain.  After the initial series, we generally will wait several months before considering a repeat injection of the same type.  If you have any questions, please call [phone] [facility]

## 2020-01-24 ENCOUNTER — Telehealth: Payer: Self-pay

## 2020-01-24 NOTE — Telephone Encounter (Signed)
Confirmation of verbal order for home medical equipment signed by provider and placed in [facility] folder. 

## 2020-01-28 ENCOUNTER — Ambulatory Visit: Payer: Medicare Other | Admitting: Pain Medicine

## 2020-02-03 ENCOUNTER — Other Ambulatory Visit: Payer: Self-pay

## 2020-02-03 ENCOUNTER — Encounter: Payer: Self-pay | Admitting: Psychiatry

## 2020-02-03 ENCOUNTER — Telehealth (INDEPENDENT_AMBULATORY_CARE_PROVIDER_SITE_OTHER): Payer: Medicare Other | Admitting: Psychiatry

## 2020-02-03 DIAGNOSIS — F5101 Primary insomnia: Secondary | ICD-10-CM | POA: Diagnosis not present

## 2020-02-03 DIAGNOSIS — F3342 Major depressive disorder, recurrent, in full remission: Secondary | ICD-10-CM

## 2020-02-03 MED ORDER — ZALEPLON 5 MG PO CAPS: 5.0000 mg | ORAL_CAPSULE | Freq: Every evening | ORAL | 1 refills | Status: DC | PRN

## 2020-02-03 NOTE — Patient Instructions (Signed)
Zaleplon capsules What is this medicine? ZALEPLON (ZAL e plon) is used to treat insomnia. This medicine helps you to fall asleep. This medicine may be used for other purposes; ask your health care provider or pharmacist if you have questions. COMMON BRAND NAME(S): Sonata What should I tell my health care provider before I take this medicine? They need to know if you have any of these conditions:  depression  history of a drug or alcohol abuse problem  liver disease  lung or breathing disease  sleep-walking, driving, eating or other activity while not fully awake after taking a sleep medicine  suicidal thoughts  an unusual or allergic reaction to zaleplon, other medicines, foods, dyes, or preservatives  pregnant or trying to get pregnant  breast-feeding How should I use this medicine? Take this medicine by mouth with water. Take it as directed on the label. Do not use it more often than directed. There may be unused or extra doses in the bottle after you finish your treatment. Talk to your health care provider if you have questions about your dose. A special MedGuide will be given to you by the pharmacist with each prescription and refill. Be sure to read this information carefully each time. Talk to your health care provider about the use of this medicine in children. Special care may be needed. Overdosage: If you think you have taken too much of this medicine contact a poison control center or emergency room at once. NOTE: This medicine is only for you. Do not share this medicine with others. What if I miss a dose? This does not apply. This medicine should only be taken immediately before going to sleep. Do not take double or extra doses. What may interact with this medicine?  barbiturate medicines for inducing sleep or treating seizures  carbamazepine  certain medicines for allergies, like azatadine, clemastine, diphenhydramine  certain medicines for depression, anxiety, or  other emotional or psychiatric problems  certain medicines for pain  cimetidine  erythromycin  medicines for fungal infections like ketoconazole, fluconazole, or itraconazole  other medicines given for sleep  phenytoin  rifampin This list may not describe all possible interactions. Give your health care provider a list of all the medicines, herbs, non-prescription drugs, or dietary supplements you use. Also tell them if you smoke, drink alcohol, or use illegal drugs. Some items may interact with your medicine. What should I watch for while using this medicine? Visit your doctor or health care professional for regular checks on your progress. Keep a regular sleep schedule by going to bed at about the same time each night. Avoid caffeine-containing drinks in the evening hours. When sleep medicines are used every night for more than a few weeks, they may stop working. Talk to your doctor if you still have trouble sleeping. After taking this medicine, you may get up out of bed and do an activity that you do not know you are doing. The next morning, you may have no memory of this. Activities include driving a car ("sleep-driving"), making and eating food, talking on the phone, sexual activity, and sleep-walking. Serious injuries have occurred. Stop the medicine and call your doctor right away if you find out you have done any of these activities. Do not take this medicine if you have used alcohol that evening. Do not take it if you have taken another medicine for sleep. The risk of doing these sleep-related activities is higher. Do not take this medicine unless you are able to stay in   bed for a full night (7 to 8 hours) before you must be active again. You may have a decrease in mental alertness the day after use, even if you feel that you are fully awake. Tell your doctor if you will need to perform activities requiring full alertness, such as driving, the next day. Do not stand or sit up quickly  after taking this medicine, especially if you are an older patient. This reduces the risk of dizzy or fainting spells. If you or your family notice any changes in your behavior, such as new or worsening depression, thoughts of harming yourself, anxiety, other unusual or disturbing thoughts, or memory loss, call your doctor right away. After you stop taking this medicine, you may have trouble falling asleep. This is called rebound insomnia. This problem usually goes away on its own after 1 or 2 nights. What side effects may I notice from receiving this medicine? Side effects that you should report to your doctor or health care professional as soon as possible:  allergic reactions like skin rash, itching or hives, swelling of the face, lips, or tongue  breathing problems  changes in vision  confusion  depression, suicidal thoughts  feeling faint or lightheaded  hallucinations  hostility, restlessness, excitability  slurred speech  staggering, tremors  unusual activities while not fully awake like driving, eating, making phone calls  unusually weak or tired Side effects that usually do not require medical attention (report to your doctor or health care professional if they continue or are bothersome):  diarrhea  difficulty with coordination  loss of memory  nightmares  stomach upset This list may not describe all possible side effects. Call your doctor for medical advice about side effects. You may report side effects to FDA at 1-800-FDA-1088. Where should I keep my medicine? Keep out of the reach of children. This medicine can be abused. Keep your medicine in a safe place to protect it from theft. Do not share this medicine with anyone. Selling or giving away this medicine is dangerous and against the law. This medicine may cause accidental overdose and death if taken by other adults, children, or pets. Mix any unused medicine with a substance like cat litter or coffee  grounds. Then throw the medicine away in a sealed container like a sealed bag or a coffee can with a lid. Do not use the medicine after the expiration date. Store at room temperature between 20 and 25 degrees C (68 and 77 degrees F). Protect from light. NOTE: This sheet is a summary. It may not cover all possible information. If you have questions about this medicine, talk to your doctor, pharmacist, or health care provider.  2021 [employer]Gold Standard (2018-01-02 09:26:01)  

## 2020-02-03 NOTE — Progress Notes (Signed)
Virtual Visit via Telephone Note  I connected with Laura [name] on 02/03/20 at  4:40 PM EST by telephone and verified that I am speaking with the correct person using two identifiers.  Location Provider Location : [facility] Patient Location : Home  Participants: Patient , Provider   I discussed the limitations, risks, security and privacy concerns of performing an evaluation and management service by telephone and the availability of in person appointments. I also discussed with the patient that there may be a patient responsible charge related to this service. The patient expressed understanding and agreed to proceed.    I discussed the assessment and treatment plan with the patient. The patient was provided an opportunity to ask questions and all were answered. The patient agreed with the plan and demonstrated an understanding of the instructions.   The patient was advised to call back or seek an in-person evaluation if the symptoms worsen or if the condition fails to improve as anticipated.   [facility] MD OP Progress Note  02/03/2020 5:18 PM Anzlee Schreiter  MRN:  [id number]  Chief Complaint:  Chief Complaint    Follow-up     HPI: Laura [name] is a 75 year old Caucasian female who has a history of MDD, primary insomnia, chronic pain was evaluated by telemedicine today.  Patient today reports overall mood wise she is doing okay.  Denies any depression or anxiety symptoms.  Patient however reports she is currently struggling with sleep.  She reports she has a lot of pain from her rheumatoid arthritis and spinal stenosis.  She reports she has difficulty walking due to the same and uses a walker or a wheelchair often.  Patient reports however she stays awake throughout the night and is unable to fall asleep.  Last night she slept around 5 AM and slept till around 10 AM.  That is the best she can get right now.  The Belsomra does not seem to be helpful at all.  Patient denies any  suicidality, homicidality or perceptual disturbances.  Patient denies any other concerns today.  Visit Diagnosis:    ICD-10-CM   1. MDD (major depressive disorder), recurrent, in full remission (HCC)  F33.42   2. Primary insomnia  F51.01 zaleplon (SONATA) 5 MG capsule    Past Psychiatric History: I have reviewed past psychiatric history from my progress note on 06/13/2018  Past Medical History:  Past Medical History:  Diagnosis Date  . Allergy   . Anxiety   . Arthritis, degenerative 10/08/2013   Overview:    a.  Lumbar spine/spinal stenosis/foot drop.   b.  Hands.   . Asthma   . Chronic hand pain, left 10/10/2019  . Chronic kidney disease   . Depression   . 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 Major depression, single episode, in complete remission (HCC) 06/25/2015  . Memory loss, short term 03/19/2014  . Sciatica of left side 12/31/2019  . Seizure (HCC) 10/07/2014  . Sleep apnea   . Sleep apnea   . Thyroid disease     Past Surgical History:  Procedure Laterality Date  . ABDOMINAL HYSTERECTOMY    . CESAREAN SECTION    . HIP SURGERY Right   . REPLACEMENT TOTAL KNEE Left     Family Psychiatric History: I have reviewed family psychiatric history from my progress note on 06/13/2018  Family History:  Family History  Problem Relation Age of Onset  . Hypertension Mother   . Stroke Mother   . Heart attack Father   .  Alcohol abuse Father   . Depression Father   . Post-traumatic stress disorder Father   . Rheum arthritis Sister   . Hypertension Sister   . Depression Sister   . Breast cancer Neg Hx     Social History: Reviewed social history from my progress note on 06/13/2018 Social History   Socioeconomic History  . Marital status: Married    Spouse name: Not on file  . Number of children: Not on file  . Years of education: Not on file  . Highest education level: Not on file  Occupational History    Comment: retired  Tobacco Use  . Smoking  status: Never Smoker  . Smokeless tobacco: Never Used  Vaping Use  . Vaping Use: Never used  Substance and Sexual Activity  . Alcohol use: No    Alcohol/week: 0.0 standard drinks  . Drug use: No  . Sexual activity: Not Currently  Other Topics Concern  . Not on file  Social History Narrative  . Not on file   Social Determinants of Health   Financial Resource Strain: Not on file  Food Insecurity: Not on file  Transportation Needs: Not on file  Physical Activity: Not on file  Stress: Not on file  Social Connections: Not on file    Allergies:  Allergies  Allergen Reactions  . Bupropion   . Penicillins Rash    Metabolic Disorder Labs: No results found for: HGBA1C, MPG No results found for: PROLACTIN No results found for: CHOL, TRIG, HDL, CHOLHDL, VLDL, LDLCALC Lab Results  Component Value Date   TSH 0.383 10/07/2018    Therapeutic Level Labs: No results found for: LITHIUM No results found for: VALPROATE No components found for:  CBMZ  Current Medications: Current Outpatient Medications  Medication Sig Dispense Refill  . zaleplon (SONATA) 5 MG capsule Take 1 capsule (5 mg total) by mouth at bedtime as needed for sleep. 10 capsule 1  . albuterol (VENTOLIN HFA) 108 (90 Base) MCG/ACT inhaler Inhale 2 puffs into the lungs every 6 (six) hours as needed for wheezing or shortness of breath. 18 g 3  . aspirin EC 81 MG tablet Take by mouth.    Marland Kitchen azelastine (ASTELIN) 0.1 % nasal spray Place into the nose.    . chlorthalidone (HYGROTON) 25 MG tablet TAKE 1/2 TABLET (12.5MG ) BY MOUTH EVERY DAY  11  . Cholecalciferol (VITAMIN D) 2000 UNITS tablet Take by mouth daily.     . DULoxetine (CYMBALTA) 60 MG capsule Take 1 capsule (60 mg total) by mouth daily. 90 capsule 1  . folic acid (FOLVITE) 1 MG tablet Take 1 mg by mouth daily.    . hydroxychloroquine (PLAQUENIL) 200 MG tablet Take 100 mg by mouth daily.     . hydrOXYzine (VISTARIL) 25 MG capsule Take 1-2 capsules (25-50 mg total)  by mouth at bedtime as needed. For sleep 180 capsule 1  . inFLIXimab (REMICADE) 100 MG injection Inject 100 mg into the vein every 8 (eight) weeks.    Marland Kitchen levothyroxine (SYNTHROID) 50 MCG tablet Take 50 mcg by mouth at bedtime.     Marland Kitchen levothyroxine (SYNTHROID, LEVOTHROID) 200 MCG tablet TAKE 1 TABLET ONCE DAILY- ON AN EMPTY STOMACH WITH A GLASS OF WATER 30-60 MINUTES BEFORE BREAKFAST  5  . loratadine (CLARITIN) 10 MG tablet Take 10 mg by mouth daily.     . methotrexate (RHEUMATREX) 2.5 MG tablet TAKE 4 TABLETS (10 MG TOTAL) BY MOUTH EVERY 7 (SEVEN) DAYS WITH A MEAL  5  .  metoprolol succinate (TOPROL-XL) 100 MG 24 hr tablet Take 1 tablet (100 mg total) by mouth daily. Take with or immediately following a meal. 30 tablet 0  . Multiple Vitamin (MULTI-VITAMINS) TABS Take by mouth.    . oxyCODONE (OXY IR/ROXICODONE) 5 MG immediate release tablet Take 1 tablet (5 mg total) by mouth every 6 (six) hours as needed for severe pain. Must last 30 days 120 tablet 0  . [START ON 02/08/2020] oxyCODONE (OXY IR/ROXICODONE) 5 MG immediate release tablet Take 1 tablet (5 mg total) by mouth every 6 (six) hours as needed for severe pain. 120 tablet 0  . [START ON 03/09/2020] oxyCODONE (OXY IR/ROXICODONE) 5 MG immediate release tablet Take 1 tablet (5 mg total) by mouth every 6 (six) hours as needed for severe pain. Must last 30 days 120 tablet 0  . rosuvastatin (CRESTOR) 5 MG tablet Take 5 mg by mouth 2 (two) times a week.    . spironolactone (ALDACTONE) 25 MG tablet TAKE 1/2 (ONE-HALF) TABLET BY MOUTH DAILY  11  . valACYclovir (VALTREX) 1000 MG tablet PLEASE SEE ATTACHED FOR DETAILED DIRECTIONS     No current facility-administered medications for this visit.     Musculoskeletal: Strength & Muscle Tone: UTA Gait & Station: UTA Patient leans: N/A  Psychiatric Specialty Exam: Review of Systems  Musculoskeletal: Positive for arthralgias.  Psychiatric/Behavioral: Positive for sleep disturbance.  All other systems  reviewed and are negative.   There were no vitals taken for this visit.There is no height or weight on file to calculate BMI.  General Appearance: UTA  Eye Contact:  UTA  Speech:  Clear and Coherent  Volume:  Normal  Mood:  Euthymic  Affect:  UTA  Thought Process:  Goal Directed and Descriptions of Associations: Intact  Orientation:  Full (Time, Place, and Person)  Thought Content: Logical   Suicidal Thoughts:  No  Homicidal Thoughts:  No  Memory:  Immediate;   Fair Recent;   Fair Remote;   Fair  Judgement:  Fair  Insight:  Fair  Psychomotor Activity:  UTA  Concentration:  Concentration: Fair and Attention Span: Fair  Recall:  [employer] of Knowledge: Fair  Language: Fair  Akathisia:  No  Handed:  Right  AIMS (if indicated): UTA  Assets:  Communication Skills Desire for Improvement Housing Social Support  ADL's:  Intact  Cognition: WNL  Sleep:  Poor   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[place] Office Visit from 03/14/2018 in [facility] Office Visit from 06/19/2017 in [facility], [facility] Office Visit from 05/29/2017 in [facility], [facility] Office Visit from 04/18/2017 in [facility], [facility] Clinical Support from 04/13/2017 in [facility]  PHQ-2 Total Score 0 0 2 0 0       Assessment and Plan: Marlissa Emerick is a 75 year old Caucasian female, married, retired, lives in [place], has a history of depression, currently in remission, primary insomnia, hypertension, congestive heart failure, GERD, diabetes melitis, hypothyroidism, rheumatoid arthritis, chronic pain, recent diagnosis of foot drop was evaluated by telemedicine today.  Patient is currently struggling with sleep.  Plan as noted below.  Plan MDD in remission Cymbalta 60 mg p.o. daily-reduced dosage  Primary insomnia-unstable Her insomnia is likely multifactorial.  She currently has a lot of pain which  could also be making her sleep problems worse. Discontinue Belsomra for lack of benefit. Start Sonata 5 mg p.o. nightly. Advised patient to work on sleep hygiene. Continue  hydroxyzine 25-50 mg at bedtime as needed.  Provided medication education including the risk of medication interaction with her opioid medication.  She will monitor herself closely for sleep complex behaviors, falls.  Patient will be cautious.  Follow-up in clinic in 2 weeks or sooner if needed.  I have spent atleast 15 minutes non face to face  with patient today. More than 50 % of the time was spent for preparing to see the patient ( e.g., review of test, records ), ordering medications and test ,psychoeducation and supportive psychotherapy and care coordination,as well as documenting clinical information in electronic health record. This note was generated in part or whole with voice recognition software. Voice recognition is usually quite accurate but there are transcription errors that can and very often do occur. I apologize for any typographical errors that were not detected and corrected.        Jomarie Longs, MD 02/04/2020, 9:32 AM

## 2020-02-04 ENCOUNTER — Encounter: Payer: Self-pay | Admitting: Pain Medicine

## 2020-02-04 ENCOUNTER — Ambulatory Visit
Admission: RE | Admit: 2020-02-04 | Discharge: 2020-02-04 | Disposition: A | Discharge status: Home / Self Care | Payer: Medicare Other | Source: Ambulatory Visit | Attending: Pain Medicine | Admitting: Pain Medicine

## 2020-02-04 ENCOUNTER — Ambulatory Visit (HOSPITAL_BASED_OUTPATIENT_CLINIC_OR_DEPARTMENT_OTHER): Payer: Medicare Other | Admitting: Pain Medicine

## 2020-02-04 VITALS — BP 162/103 | HR 86 | Temp 97.0°F | Resp 18 | Ht 65.0 in | Wt 265.0 lb

## 2020-02-04 DIAGNOSIS — M21372 Foot drop, left foot: Secondary | ICD-10-CM | POA: Diagnosis present

## 2020-02-04 DIAGNOSIS — Z7901 Long term (current) use of anticoagulants: Secondary | ICD-10-CM

## 2020-02-04 DIAGNOSIS — M48061 Spinal stenosis, lumbar region without neurogenic claudication: Secondary | ICD-10-CM | POA: Diagnosis present

## 2020-02-04 DIAGNOSIS — M5417 Radiculopathy, lumbosacral region: Secondary | ICD-10-CM | POA: Insufficient documentation

## 2020-02-04 DIAGNOSIS — M25552 Pain in left hip: Secondary | ICD-10-CM | POA: Diagnosis present

## 2020-02-04 DIAGNOSIS — M79604 Pain in right leg: Secondary | ICD-10-CM | POA: Insufficient documentation

## 2020-02-04 DIAGNOSIS — M48062 Spinal stenosis, lumbar region with neurogenic claudication: Secondary | ICD-10-CM | POA: Diagnosis present

## 2020-02-04 DIAGNOSIS — R29898 Other symptoms and signs involving the musculoskeletal system: Secondary | ICD-10-CM | POA: Diagnosis present

## 2020-02-04 DIAGNOSIS — M5442 Lumbago with sciatica, left side: Secondary | ICD-10-CM | POA: Diagnosis present

## 2020-02-04 DIAGNOSIS — M5432 Sciatica, left side: Secondary | ICD-10-CM | POA: Insufficient documentation

## 2020-02-04 DIAGNOSIS — G8929 Other chronic pain: Secondary | ICD-10-CM

## 2020-02-04 DIAGNOSIS — Z6841 Body Mass Index (BMI) 40.0 and over, adult: Secondary | ICD-10-CM | POA: Diagnosis present

## 2020-02-04 DIAGNOSIS — M5441 Lumbago with sciatica, right side: Secondary | ICD-10-CM | POA: Diagnosis present

## 2020-02-04 DIAGNOSIS — M79605 Pain in left leg: Secondary | ICD-10-CM

## 2020-02-04 MED ORDER — ROPIVACAINE HCL 2 MG/ML IJ SOLN
2.0000 mL | Freq: Once | INTRAMUSCULAR | Status: AC
  Administered 2020-02-04: 2 mL via EPIDURAL
  Filled 2020-02-04: qty 10

## 2020-02-04 MED ORDER — IOHEXOL 180 MG/ML  SOLN
10.0000 mL | Freq: Once | INTRAMUSCULAR | Status: AC
  Administered 2020-02-04: 10 mL via EPIDURAL

## 2020-02-04 MED ORDER — LIDOCAINE HCL 2 % IJ SOLN
20.0000 mL | Freq: Once | INTRAMUSCULAR | Status: AC
  Administered 2020-02-04: 100 mg
  Filled 2020-02-04: qty 20

## 2020-02-04 MED ORDER — SODIUM CHLORIDE 0.9% FLUSH
2.0000 mL | Freq: Once | INTRAVENOUS | Status: AC
  Administered 2020-02-04: 2 mL

## 2020-02-04 MED ORDER — TRIAMCINOLONE ACETONIDE 40 MG/ML IJ SUSP
40.0000 mg | Freq: Once | INTRAMUSCULAR | Status: AC
  Administered 2020-02-04: 40 mg
  Filled 2020-02-04: qty 1

## 2020-02-04 NOTE — Progress Notes (Signed)
Safety precautions to be maintained throughout the outpatient stay will include: orient to surroundings, keep bed in low position, maintain call bell within reach at all times, provide assistance with transfer out of bed and ambulation.  

## 2020-02-04 NOTE — Progress Notes (Signed)
PROVIDER NOTE: Information contained herein reflects review and annotations entered in association with encounter. Interpretation of such information and data should be left to medically-trained personnel. Information provided to patient can be located elsewhere in the medical record under "Patient Instructions". Document created using STT-dictation technology, any transcriptional errors that may result from process are unintentional.    Patient: Laura [name]  Service Category: Procedure  Provider: Oswaldo Done, MD  DOB: [birth year]  DOS: 02/04/2020  Location: [facility]  MRN: [id number]  Setting: Ambulatory - outpatient  Referring Provider: Lynnea Ferrier, MD  Type: Established Patient  Specialty: Interventional Pain Management  PCP: Lynnea Ferrier, MD   Primary Reason for Visit: Interventional Pain Management Treatment. CC: Back Pain (low)  Procedure:          Anesthesia, Analgesia, Anxiolysis:  Type: Therapeutic Inter-Laminar Epidural Steroid Injection  #1  Region: Lumbar Level: L4-5 Level. Laterality: Left Paramedial  Type: Local Anesthesia Indication(s): Analgesia         Route: Infiltration ([place]/IM) IV Access: Declined Sedation: Declined  Local Anesthetic: Lidocaine 1-2%  Position: Prone with head of the table was raised to facilitate breathing.   Indications: 1. Chronic lower extremity pain (Left)   2. Chronic low back pain (1ry area of Pain) (Bilateral) (L>R) w/ sciatica (Bilateral)   3. Chronic hip pain (Left)   4. 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 Lumbar spinal stenosis (5 mm Severe L3-4; 8 mm L4-5) w/ neurogenic claudication   6. Weakness of leg (Left)   7. Weakness of foot (Left)   8. Sciatica (Left)   9. Dropfoot (Left)   10. Lumbosacral radiculopathy at L5 (Left)   11. Chronic anticoagulation (Plaquenil)    Pain Score: Pre-procedure: 7 /10 Post-procedure: 7 /10   Pre-op H&P Assessment:  [name] is a 75  y.o. (year old), female patient, seen today for interventional treatment. She  has a past surgical history that includes Abdominal hysterectomy; Cesarean section; Replacement total knee (Left); and Hip surgery (Right). Ms. Benegas has a current medication list which includes the following prescription(s): albuterol, aspirin ec, azelastine, chlorthalidone, vitamin d, duloxetine, folic acid, hydroxychloroquine, hydroxyzine, infliximab, levothyroxine, levothyroxine, loratadine, methotrexate, metoprolol succinate, multi-vitamins, oxycodone, [START ON 02/08/2020] oxycodone, [START ON 03/09/2020] oxycodone, rosuvastatin, spironolactone, valacyclovir, and zaleplon. Her primarily concern today is the Back Pain (low)  Initial Vital Signs:  Pulse/HCG Rate: 86ECG Heart Rate: 87 Temp: (!) 97 F (36.1 C) Resp: 18 BP: (!) 147/85 SpO2: 100 %  BMI: Estimated body mass index is 44.1 kg/m as calculated from the following:   Height as of this encounter: 5\' 5"  (1.651 m).   Weight as of this encounter: 265 lb (120.2 kg).  Risk Assessment: Allergies: Reviewed. She is allergic to bupropion and penicillins.  Allergy Precautions: None required Coagulopathies: Reviewed. None identified.  Blood-thinner therapy: None at this time Active Infection(s): Reviewed. None identified. [name] is afebrile  Site Confirmation: Ms. Kepple was asked to confirm the procedure and laterality before marking the site Procedure checklist: Completed Consent: Before the procedure and under the influence of no sedative(s), amnesic(s), or anxiolytics, the patient was informed of the treatment options, risks and possible complications. To fulfill our ethical and legal obligations, as recommended by the American Medical Association's Code of Ethics, I have informed the patient of my clinical impression; the nature and purpose of the treatment or procedure; the risks, benefits, and possible complications of the intervention; the alternatives,  including doing  nothing; the risk(s) and benefit(s) of the alternative treatment(s) or procedure(s); and the risk(s) and benefit(s) of doing nothing. The patient was provided information about the general risks and possible complications associated with the procedure. These may include, but are not limited to: failure to achieve desired goals, infection, bleeding, organ or nerve damage, allergic reactions, paralysis, and death. In addition, the patient was informed of those risks and complications associated to Spine-related procedures, such as failure to decrease pain; infection (i.e.: Meningitis, epidural or intraspinal abscess); bleeding (i.e.: epidural hematoma, subarachnoid hemorrhage, or any other type of intraspinal or peri-dural bleeding); organ or nerve damage (i.e.: Any type of peripheral nerve, nerve root, or spinal cord injury) with subsequent damage to sensory, motor, and/or autonomic systems, resulting in permanent pain, numbness, and/or weakness of one or several areas of the body; allergic reactions; (i.e.: anaphylactic reaction); and/or death. Furthermore, the patient was informed of those risks and complications associated with the medications. These include, but are not limited to: allergic reactions (i.e.: anaphylactic or anaphylactoid reaction(s)); adrenal axis suppression; blood sugar elevation that in diabetics may result in ketoacidosis or comma; water retention that in patients with history of congestive heart failure may result in shortness of breath, pulmonary edema, and decompensation with resultant heart failure; weight gain; swelling or edema; medication-induced neural toxicity; particulate matter embolism and blood vessel occlusion with resultant organ, and/or nervous system infarction; and/or aseptic necrosis of one or more joints. Finally, the patient was informed that Medicine is not an exact science; therefore, there is also the possibility of unforeseen or unpredictable risks  and/or possible complications that may result in a catastrophic outcome. The patient indicated having understood very clearly. We have given the patient no guarantees and we have made no promises. Enough time was given to the patient to ask questions, all of which were answered to the patient's satisfaction. Ms. Dack has indicated that she wanted to continue with the procedure. Attestation: I, the ordering provider, attest that I have discussed with the patient the benefits, risks, side-effects, alternatives, likelihood of achieving goals, and potential problems during recovery for the procedure that I have provided informed consent. Date  Time: 02/04/2020 11:18 AM  Pre-Procedure Preparation:  Monitoring: As per clinic protocol. Respiration, ETCO2, SpO2, BP, heart rate and rhythm monitor placed and checked for adequate function Safety Precautions: Patient was assessed for positional comfort and pressure points before starting the procedure. Time-out: I initiated and conducted the "Time-out" before starting the procedure, as per protocol. The patient was asked to participate by confirming the accuracy of the "Time Out" information. Verification of the correct person, site, and procedure were performed and confirmed by me, the nursing staff, and the patient. "Time-out" conducted as per Joint Commission's Universal Protocol (UP.01.01.01). Time: 1207  Description of Procedure:          Target Area: The interlaminar space, initially targeting the lower laminar border of the superior vertebral body. Approach: Paramedial approach. Area Prepped: Entire Posterior Lumbar Region DuraPrep (Iodine Povacrylex [0.7% available iodine] and Isopropyl Alcohol, 74% w/w) Safety Precautions: Aspiration looking for blood return was conducted prior to all injections. At no point did we inject any substances, as a needle was being advanced. No attempts were made at seeking any paresthesias. Safe injection practices and  needle disposal techniques used. Medications properly checked for expiration dates. SDV (single dose vial) medications used. Description of the Procedure: Protocol guidelines were followed. The procedure needle was introduced through the skin, ipsilateral to the reported pain, and  advanced to the target area. Bone was contacted and the needle walked caudad, until the lamina was cleared. The epidural space was identified using "loss-of-resistance technique" with 2-3 ml of PF-NaCl (0.9% NSS), in a 5cc LOR glass syringe.  Vitals:   02/04/20 1120 02/04/20 1206 02/04/20 1211 02/04/20 1216  BP: (!) 147/85 (!) 167/87 (!) 182/95 (!) 162/103  Pulse: 86     Resp:  18 16 18   Temp:      SpO2: 100% 98% 97% 98%  Weight:      Height:        Start Time: 1208 hrs. End Time: 1216 hrs.  Materials:  Needle(s) Type: Epidural needle Gauge: 20G Length: 15cm Medication(s): Please see orders for medications and dosing details.  Imaging Guidance (Spinal):          Type of Imaging Technique: Fluoroscopy Guidance (Spinal) Indication(s): Assistance in needle guidance and placement for procedures requiring needle placement in or near specific anatomical locations not easily accessible without such assistance. Exposure Time: Please see nurses notes. Contrast: Before injecting any contrast, we confirmed that the patient did not have an allergy to iodine, shellfish, or radiological contrast. Once satisfactory needle placement was completed at the desired level, radiological contrast was injected. Contrast injected under live fluoroscopy. No contrast complications. See chart for type and volume of contrast used. Fluoroscopic Guidance: I was personally present during the use of fluoroscopy. "Tunnel Vision Technique" used to obtain the best possible view of the target area. Parallax error corrected before commencing the procedure. "Direction-depth-direction" technique used to introduce the needle under continuous pulsed  fluoroscopy. Once target was reached, antero-posterior, oblique, and lateral fluoroscopic projection used confirm needle placement in all planes. Images permanently stored in EMR. Interpretation: I personally interpreted the imaging intraoperatively. Adequate needle placement confirmed in multiple planes. Appropriate spread of contrast into desired area was observed. No evidence of afferent or efferent intravascular uptake. No intrathecal or subarachnoid spread observed. Permanent images saved into the patient's record.  Antibiotic Prophylaxis:   Anti-infectives (From admission, onward)   None     Indication(s): None identified  Post-operative Assessment:  Post-procedure Vital Signs:  Pulse/HCG Rate: 8682 Temp: (!) 97 F (36.1 C) Resp: 18 BP: (!) 162/103 SpO2: 98 %  EBL: None  Complications: No immediate post-treatment complications observed by team, or reported by patient.  Note: The patient tolerated the entire procedure well. A repeat set of vitals were taken after the procedure and the patient was kept under observation following institutional policy, for this type of procedure. Post-procedural neurological assessment was performed, showing return to baseline, prior to discharge. The patient was provided with post-procedure discharge instructions, including a section on how to identify potential problems. Should any problems arise concerning this procedure, the patient was given instructions to immediately contact us, at any time, without hesitation. In any case, we plan to contact the patient by telephone for a follow-up status report regarding this interventional procedure.  Comments:  No additional relevant information.  Plan of Care  Orders:  Orders Placed This Encounter  Procedures  . Lumbar Epidural Injection    Scheduling Instructions:     Procedure: Interlaminar LESI L4-5     Laterality: Left-sided     Sedation: No Sedation     Timeframe:  Today    Order Specific  Question:   Where will this procedure be performed?    Answer:   [facility]  . DG [facility] C-ARM 1-60 MIN NO REPORT    Intraoperative  interpretation by procedural physician at [facility].    Standing Status:   Standing    Number of Occurrences:   1    Order Specific Question:   Reason for exam:    Answer:   Assistance in needle guidance and placement for procedures requiring needle placement in or near specific anatomical locations not easily accessible without such assistance.  . Informed Consent Details: Physician/Practitioner Attestation; Transcribe to consent form and obtain patient signature    Note: Always confirm laterality of pain with Ms. Leighton, before procedure. Transcribe to consent form and obtain patient signature.    Order Specific Question:   Physician/Practitioner attestation of informed consent for procedure/surgical case    Answer:   I, the physician/practitioner, attest that I have discussed with the patient the benefits, risks, side effects, alternatives, likelihood of achieving goals and potential problems during recovery for the procedure that I have provided informed consent.    Order Specific Question:   Procedure    Answer:   Lumbar epidural steroid injection under fluoroscopic guidance    Order Specific Question:   Physician/Practitioner performing the procedure    Answer:   Traye Bates A. 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 Care order/instruction: Please confirm that the patient has stopped the Plaquenil (Hydroxychloroquine) x 11 days prior to procedure or surgery.    Please confirm that the patient has stopped the Plaquenil (Hydroxychloroquine) x 11 days prior to procedure or surgery.    Standing Status:   Standing    Number of Occurrences:   1  . Provide equipment / supplies at bedside    "Epidural Tray" (Disposable  single  use) Catheter: NOT required Needle: Long    Standing Status:   Standing    Number of Occurrences:   1    Order Specific Question:   Specify    Answer:   Epidural Tray  . Bleeding precautions    Standing Status:   Standing    Number of Occurrences:   1   Chronic Opioid Analgesic:  Oxycodone IR 5 mg every 6 hours (20 mg/day) MME/day:30 mg/day.   Medications ordered for procedure: Meds ordered this encounter  Medications  . iohexol (OMNIPAQUE) 180 MG/ML injection 10 mL    Must be Myelogram-compatible. If not available, you may substitute with a water-soluble, non-ionic, hypoallergenic, myelogram-compatible radiological contrast medium.  Marland Kitchen lidocaine (XYLOCAINE) 2 % (with pres) injection 400 mg  . sodium chloride flush (NS) 0.9 % injection 2 mL  . ropivacaine (PF) 2 mg/mL (0.2%) (NAROPIN) injection 2 mL  . triamcinolone acetonide (KENALOG-40) injection 40 mg   Medications administered: We administered iohexol, lidocaine, sodium chloride flush, ropivacaine (PF) 2 mg/mL (0.2%), and triamcinolone acetonide.  See the medical record for exact dosing, route, and time of administration.  Follow-up plan:   Return in about 2 weeks (around 02/18/2020) for (F2F), (PP) Follow-up.       Considering:   Therapeutic right IA Hyalgan knee injections  Diagnostic left hip IA joint injection  Possible left hip RFA  Diagnostic right IA knee joint injection (w/ steroid)  Diagnostic bilateral genicular NB  Possible bilateral genicular nerve RFA  Diagnostic bilateral lumbar facet block  Possible bilateral lumbar facet RFA  Diagnostic bilateral L3 and/or L5 TFESI  Diagnostic L3-4 vs L4-5 LESI    Palliative PRN treatment(s):   Therapeutic right IA Hyalgan knee injections  Palliative left IA hip joint injection  Diagnostic left lumbar facet block #2        Recent Visits Date Type Provider Dept  01/20/20 Office Visit Delano Metz, MD [facility]  12/31/19 Telemedicine [name],  MD [facility]  Showing recent visits within past 90 days and meeting all other requirements Today's Visits Date Type Provider Dept  02/04/20 Procedure visit Delano Metz, MD [facility]  Showing today's visits and meeting all other requirements Future Appointments Date Type Provider Dept  02/19/20 Appointment Delano Metz, MD [facility]  Showing future appointments within next 90 days and meeting all other requirements  Disposition: Discharge home  Discharge (Date  Time): 02/04/2020; 1230 hrs.   Primary Care Physician: Lynnea Ferrier, MD Location: [facility] Note by: Oswaldo Done, MD Date: 02/04/2020; Time: 1:15 PM  Disclaimer:  Medicine is not an Visual merchandiser. The only guarantee in medicine is that nothing is guaranteed. It is important to note that the decision to proceed with this intervention was based on the information collected from the patient. The Data and conclusions were drawn from the patient's questionnaire, the interview, and the physical examination. Because the information was provided in large part by the patient, it cannot be guaranteed that it has not been purposely or unconsciously manipulated. Every effort has been made to obtain as much relevant data as possible for this evaluation. It is important to note that the conclusions that lead to this procedure are derived in large part from the available data. Always take into account that the treatment will also be dependent on availability of resources and existing treatment guidelines, considered by other Pain Management Practitioners as being common knowledge and practice, at the time of the intervention. For Medico-Legal purposes, it is also important to point out that variation in procedural techniques and pharmacological choices are the acceptable norm. The indications, contraindications, technique, and results of the above procedure should  only be interpreted and judged by a Board-Certified Interventional Pain Specialist with extensive familiarity and expertise in the same exact procedure and technique.

## 2020-02-04 NOTE — Patient Instructions (Addendum)
____________________________________________________________________________________________  Post-Procedure Discharge Instructions  Instructions:  Apply ice:   Purpose: This will minimize any swelling and discomfort after procedure.   When: Day of procedure, as soon as you get home.  How: Fill a plastic sandwich bag with crushed ice. Cover it with a small towel and apply to injection site.  How long: (15 min on, 15 min off) Apply for 15 minutes then remove x 15 minutes.  Repeat sequence on day of procedure, until you go to bed.  Apply heat:   Purpose: To treat any soreness and discomfort from the procedure.  When: Starting the next day after the procedure.  How: Apply heat to procedure site starting the day following the procedure.  How long: May continue to repeat daily, until discomfort goes away.  Food intake: Start with clear liquids (like water) and advance to regular food, as tolerated.   Physical activities: Keep activities to a minimum for the first 8 hours after the procedure. After that, then as tolerated.  Driving: If you have received any sedation, be responsible and do not drive. You are not allowed to drive for 24 hours after having sedation.  Blood thinner: (Applies only to those taking blood thinners) You may restart your blood thinner 6 hours after your procedure.  Insulin: (Applies only to Diabetic patients taking insulin) As soon as you can eat, you may resume your normal dosing schedule.  Infection prevention: Keep procedure site clean and dry. Shower daily and clean area with soap and water.  Post-procedure Pain Diary: Extremely important that this be done correctly and accurately. Recorded information will be used to determine the next step in treatment. For the purpose of accuracy, follow these rules:  Evaluate only the area treated. Do not report or include pain from an untreated area. For the purpose of this evaluation, ignore all other areas of pain,  except for the treated area.  After your procedure, avoid taking a long nap and attempting to complete the pain diary after you wake up. Instead, set your alarm clock to go off every hour, on the hour, for the initial 8 hours after the procedure. Document the duration of the numbing medicine, and the relief you are getting from it.  Do not go to sleep and attempt to complete it later. It will not be accurate. If you received sedation, it is likely that you were given a medication that may cause amnesia. Because of this, completing the diary at a later time may cause the information to be inaccurate. This information is needed to plan your care.  Follow-up appointment: Keep your post-procedure follow-up evaluation appointment after the procedure (usually 2 weeks for most procedures, 6 weeks for radiofrequencies). DO NOT FORGET to bring you pain diary with you.   Expect: (What should I expect to see with my procedure?)  From numbing medicine (AKA: Local Anesthetics): Numbness or decrease in pain. You may also experience some weakness, which if present, could last for the duration of the local anesthetic.  Onset: Full effect within 15 minutes of injected.  Duration: It will depend on the type of local anesthetic used. On the average, 1 to 8 hours.   From steroids (Applies only if steroids were used): Decrease in swelling or inflammation. Once inflammation is improved, relief of the pain will follow.  Onset of benefits: Depends on the amount of swelling present. The more swelling, the longer it will take for the benefits to be seen. In some cases, up to 10 days.  Duration: Steroids will stay in the system x 2 weeks. Duration of benefits will depend on multiple posibilities including persistent irritating factors.  Side-effects: If present, they may typically last 2 weeks (the duration of the steroids).  Frequent: Cramps (if they occur, drink Gatorade and take over-the-counter Magnesium 450-500 mg  once to twice a day); water retention with temporary weight gain; increases in blood sugar; decreased immune system response; increased appetite.  Occasional: Facial flushing (red, warm cheeks); mood swings; menstrual changes.  Uncommon: Long-term decrease or suppression of natural hormones; bone thinning. (These are more common with higher doses or more frequent use. This is why we prefer that our patients avoid having any injection therapies in other practices.)   Very Rare: Severe mood changes; psychosis; aseptic necrosis.  From procedure: Some discomfort is to be expected once the numbing medicine wears off. This should be minimal if ice and heat are applied as instructed.  Call if: (When should I call?)  You experience numbness and weakness that gets worse with time, as opposed to wearing off.  New onset bowel or bladder incontinence. (Applies only to procedures done in the spine)  Emergency Numbers:  Durning business hours (Monday - Thursday, 8:00 AM - 4:00 PM) (Friday, 9:00 AM - 12:00 Noon): [phone]  After hours: [phone]  NOTE: If you are having a problem and are unable connect with, or to talk to a provider, then go to your nearest urgent care or emergency department. If the problem is serious and urgent, please call 911. ____________________________________________________________________________________________    ______________________________________________________________________________________________  Body mass index (BMI)  Body mass index (BMI) is a common tool for deciding whether a person has an appropriate body weight.  It measures a persons weight in relation to their height.   According to the [employer] of health (NIH): Marland Kitchen A BMI of less than 18.5 means that a person is underweight. . A BMI of between 18.5 and 24.9 is ideal. . A BMI of between 25 and 29.9 is overweight. . A BMI over 30 indicates obesity.  Weight Management  Required  URGENT: Your weight has been found to be adversely affecting your health.  Dear [name]:  Your current Estimated body mass index is 44.93 kg/m as calculated from the following:   Height as of 01/20/20: _0  (1.651 m).   Weight as of 01/20/20: 270 lb (122.5 kg).  Please use the table below to identify your weight category and associated incidence of chronic pain, secondary to your weight.  Body Mass Index (BMI) Classification BMI level (kg/m2) Category Associated incidence of chronic pain  <18  Underweight   18.5-24.9 Ideal body weight   25-29.9 Overweight  20%  30-34.9 Obese (Class I)  68%  35-39.9 Severe obesity (Class II)  136%  >40 Extreme obesity (Class III)  254%   In addition: You will be considered "Morbidly Obese", if your BMI is above 30 and you have one or more of the following conditions which are known to be caused and/or directly associated with obesity: 1.    Type 2 Diabetes (Which in turn can lead to cardiovascular diseases (CVD), stroke, peripheral vascular diseases (PVD), retinopathy, nephropathy, and neuropathy) 2.    Cardiovascular Disease (High Blood Pressure; Congestive Heart Failure; High Cholesterol; Coronary Artery Disease; Angina; or History of Heart Attacks) 3.    Breathing problems (Asthma; obesity-hypoventilation syndrome; obstructive sleep apnea; chronic inflammatory airway disease; reactive airway disease; or shortness of breath) 4.    Chronic kidney  disease 5.    Liver disease (nonalcoholic fatty liver disease) 6.    High blood pressure 7.    Acid reflux (gastroesophageal reflux disease; heartburn) 8.    Osteoarthritis (OA) (with any of the following: hip pain; knee pain; and/or low back pain) 9.    Low back pain (Lumbar Facet Syndrome; and/or Degenerative Disc Disease) 10.  Hip pain (Osteoarthritis of hip) (For every 1 lbs of added body weight, there is a 2 lbs increase in pressure inside of each hip articulation. 1:2 mechanical  relationship) 11.  Knee pain (Osteoarthritis of knee) (For every 1 lbs of added body weight, there is a 4 lbs increase in pressure inside of each knee articulation. 1:4 mechanical relationship) (patients with a BMI>30 kg/m2 were 6.8 times more likely to develop knee OA than normal-weight individuals) 12.  Cancer: Epidemiological studies have shown that obesity is a risk factor for: post-menopausal breast cancer; cancers of the endometrium, colon and kidney cancer; malignant adenomas of the oesophagus. Obese subjects have an approximately 1.5-3.5-fold increased risk of developing these cancers compared with normal-weight subjects, and it has been estimated that between 15 and 45% of these cancers can be attributed to overweight. More recent studies suggest that obesity may also increase the risk of other types of cancer, including pancreatic, hepatic and gallbladder cancer. (Ref: Obesity and cancer. Pischon T, Nthlings U, [employer] H. Proc Nutr Soc. 2008 May;67(2):128-45. doi: 11.5726/O0355974163845364.) The International Agency for Research on Cancer (IARC) has identified 13 cancers associated with overweight and obesity: meningioma, multiple myeloma, adenocarcinoma of the esophagus, and cancers of the thyroid, postmenopausal breast cancer, gallbladder, stomach, liver, pancreas, kidney, ovaries, uterus, colon and rectal (colorectal) cancers. 28 percent of all cancers diagnosed in women and 24 percent of those diagnosed in men are associated with overweight and obesity.  Recommendation: At this point it is urgent that you take a step back and concentrate in loosing weight. Dedicate 100% of your efforts on this task. Nothing else will improve your health more than bringing your weight down and your BMI to less than 30. If you are here, you probably have chronic pain. We know that most chronic pain patients have difficulty exercising secondary to their pain. For this reason, you must rely on proper nutrition and diet  in order to lose the weight. If your BMI is above 40, you should seriously consider bariatric surgery. A realistic goal is to lose 10% of your body weight over a period of 12 months.  Be honest to yourself, if over time you have unsuccessfully tried to lose weight, then it is time for you to seek professional help and to enter a medically supervised weight management program, and/or undergo bariatric surgery. Stop procrastinating.   Pain management considerations:  1.    Pharmacological Problems: Be advised that the use of opioid analgesics (oxycodone; hydrocodone; morphine; methadone; codeine; and all of their derivatives) have been associated with decreased metabolism and weight gain.  For this reason, should we see that you are unable to lose weight while taking these medications, it may become necessary for [place] to taper down and indefinitely discontinue them.  2.    Technical Problems: The incidence of successful interventional therapies decreases as the patient's BMI increases. It is much more difficult to accomplish a safe and effective interventional therapy on a patient with a BMI above 35. 3.    Radiation Exposure Problems: The x-rays machine, used to accomplish injection therapies, will automatically increase their x-ray output in order to  capture an appropriate bone image. This means that radiation exposure increases exponentially with the patient's BMI. (The higher the BMI, the higher the radiation exposure.) Although the level of radiation used at a given time is still safe to the patient, it is not for the physician and/or assisting staff. Unfortunately, radiation exposure is accumulative. Because physicians and the staff have to do procedures and be exposed on a daily basis, this can result in health problems such as cancer and radiation burns. Radiation exposure to the staff is monitored by the radiation batches that they wear. The exposure levels are reported back to the staff on a quarterly  basis. Depending on levels of exposure, physicians and staff may be obligated by law to decrease this exposure. This means that they have the right and obligation to refuse providing therapies where they may be overexposed to radiation. For this reason, physicians may decline to offer therapies such as radiofrequency ablation or implants to patients with a BMI above 40. 4.    Current Trends: Be advised that the current trend is to no longer offer certain therapies to patients with a BMI equal to, or above 35, due to increase perioperative risks, increased technical procedural difficulties, and excessive radiation exposure to healthcare personnel.  ______________________________________________________________________________________________    GENERAL RISKS AND COMPLICATIONS  What are the risk, side effects and possible complications? Generally speaking, most procedures are safe.  However, with any procedure there are risks, side effects, and the possibility of complications.  The risks and complications are dependent upon the sites that are lesioned, or the type of nerve block to be performed.  The closer the procedure is to the spine, the more serious the risks are.  Great care is taken when placing the radio frequency needles, block needles or lesioning probes, but sometimes complications can occur. 1. Infection: Any time there is an injection through the skin, there is a risk of infection.  This is why sterile conditions are used for these blocks.  There are four possible types of infection. 1. Localized skin infection. 2. Central Nervous System Infection-This can be in the form of Meningitis, which can be deadly. 3. Epidural Infections-This can be in the form of an epidural abscess, which can cause pressure inside of the spine, causing compression of the spinal cord with subsequent paralysis. This would require an emergency surgery to decompress, and there are no guarantees that the patient would  recover from the paralysis. 4. Discitis-This is an infection of the intervertebral discs.  It occurs in about 1% of discography procedures.  It is difficult to treat and it may lead to surgery.        2. Pain: the needles have to go through skin and soft tissues, will cause soreness.       3. Damage to internal structures:  The nerves to be lesioned may be near blood vessels or    other nerves which can be potentially damaged.       4. Bleeding: Bleeding is more common if the patient is taking blood thinners such as  aspirin, Coumadin, Ticiid, Plavix, etc., or if he/she have some genetic predisposition  such as hemophilia. Bleeding into the spinal canal can cause compression of the spinal  cord with subsequent paralysis.  This would require an emergency surgery to  decompress and there are no guarantees that the patient would recover from the  paralysis.       5. Pneumothorax:  Puncturing of a lung is a possibility, every  time a needle is introduced in  the area of the chest or upper back.  Pneumothorax refers to free air around the  collapsed lung(s), inside of the thoracic cavity (chest cavity).  Another two possible  complications related to a similar event would include: Hemothorax and Chylothorax.   These are variations of the Pneumothorax, where instead of air around the collapsed  lung(s), you may have blood or chyle, respectively.       6. Spinal headaches: They may occur with any procedures in the area of the spine.       7. Persistent CSF (Cerebro-Spinal Fluid) leakage: This is a rare problem, but may occur  with prolonged intrathecal or epidural catheters either due to the formation of a fistulous  track or a dural tear.       8. Nerve damage: By working so close to the spinal cord, there is always a possibility of  nerve damage, which could be as serious as a permanent spinal cord injury with  paralysis.       9. Death:  Although rare, severe deadly allergic reactions known as "Anaphylactic   reaction" can occur to any of the medications used.      10. Worsening of the symptoms:  We can always make thing worse.  What are the chances of something like this happening? Chances of any of this occuring are extremely low.  By statistics, you have more of a chance of getting killed in a motor vehicle accident: while driving to the hospital than any of the above occurring .  Nevertheless, you should be aware that they are possibilities.  In general, it is similar to taking a shower.  Everybody knows that you can slip, hit your head and get killed.  Does that mean that you should not shower again?  Nevertheless always keep in mind that statistics do not mean anything if you happen to be on the wrong side of them.  Even if a procedure has a 1 (one) in a 1,000,000 (million) chance of going wrong, it you happen to be that one..Also, keep in mind that by statistics, you have more of a chance of having something go wrong when taking medications.  Who should not have this procedure? If you are on a blood thinning medication (e.g. Coumadin, Plavix, see list of "Blood Thinners"), or if you have an active infection going on, you should not have the procedure.  If you are taking any blood thinners, please inform your physician.  How should I prepare for this procedure?  Do not eat or drink anything at least six hours prior to the procedure.  Bring a driver with you .  It cannot be a taxi.  Come accompanied by an adult that can drive you back, and that is strong enough to help you if your legs get weak or numb from the local anesthetic.  Take all of your medicines the morning of the procedure with just enough water to swallow them.  If you have diabetes, make sure that you are scheduled to have your procedure done first thing in the morning, whenever possible.  If you have diabetes, take only half of your insulin dose and notify our nurse that you have done so as soon as you arrive at the clinic.  If  you are diabetic, but only take blood sugar pills (oral hypoglycemic), then do not take them on the morning of your procedure.  You may take them after you have had the  procedure.  Do not take aspirin or any aspirin-containing medications, at least eleven (11) days prior to the procedure.  They may prolong bleeding.  Wear loose fitting clothing that may be easy to take off and that you would not mind if it got stained with Betadine or blood.  Do not wear any jewelry or perfume  Remove any nail coloring.  It will interfere with some of our monitoring equipment.  NOTE: Remember that this is not meant to be interpreted as a complete list of all possible complications.  Unforeseen problems may occur.  BLOOD THINNERS The following drugs contain aspirin or other products, which can cause increased bleeding during surgery and should not be taken for 2 weeks prior to and 1 week after surgery.  If you should need take something for relief of minor pain, you may take acetaminophen which is found in Tylenol,m Datril, Anacin-3 and Panadol. It is not blood thinner. The products listed below are.  Do not take any of the products listed below in addition to any listed on your instruction sheet.  A.P.C or A.P.C with Codeine Codeine Phosphate Capsules #3 Ibuprofen Ridaura  ABC compound Congesprin Imuran rimadil  Advil Cope Indocin Robaxisal  Alka-Seltzer Effervescent Pain Reliever and Antacid Coricidin or Coricidin-D  Indomethacin Rufen  Alka-Seltzer plus Cold Medicine Cosprin Ketoprofen S-A-C Tablets  Anacin Analgesic Tablets or Capsules Coumadin Korlgesic Salflex  Anacin Extra Strength Analgesic tablets or capsules CP-2 Tablets Lanoril Salicylate  Anaprox Cuprimine Capsules Levenox Salocol  Anexsia-D Dalteparin Magan Salsalate  Anodynos Darvon compound Magnesium Salicylate Sine-off  Ansaid Dasin Capsules Magsal Sodium Salicylate  Anturane Depen Capsules Marnal Soma  APF Arthritis pain formula Dewitt's  Pills Measurin Stanback  Argesic Dia-Gesic Meclofenamic Sulfinpyrazone  Arthritis Bayer Timed Release Aspirin Diclofenac Meclomen Sulindac  Arthritis pain formula Anacin Dicumarol Medipren Supac  Analgesic (Safety coated) Arthralgen Diffunasal Mefanamic Suprofen  Arthritis Strength Bufferin Dihydrocodeine Mepro Compound Suprol  Arthropan liquid Dopirydamole Methcarbomol with Aspirin Synalgos  ASA tablets/Enseals Disalcid Micrainin Tagament  Ascriptin Doan's Midol Talwin  Ascriptin A/D Dolene Mobidin Tanderil  Ascriptin Extra Strength Dolobid Moblgesic Ticlid  Ascriptin with Codeine Doloprin or Doloprin with Codeine Momentum Tolectin  Asperbuf Duoprin Mono-gesic Trendar  Aspergum Duradyne Motrin or Motrin IB Triminicin  Aspirin plain, buffered or enteric coated Durasal Myochrisine Trigesic  Aspirin Suppositories Easprin Nalfon Trillsate  Aspirin with Codeine Ecotrin Regular or Extra Strength Naprosyn Uracel  Atromid-S Efficin Naproxen Ursinus  Auranofin Capsules Elmiron Neocylate Vanquish  Axotal Emagrin Norgesic Verin  Azathioprine Empirin or Empirin with Codeine Normiflo Vitamin E  Azolid Emprazil Nuprin Voltaren  Bayer Aspirin plain, buffered or children's or timed BC Tablets or powders Encaprin Orgaran Warfarin Sodium  Buff-a-Comp Enoxaparin Orudis Zorpin  Buff-a-Comp with Codeine Equegesic Os-Cal-Gesic   Buffaprin Excedrin plain, buffered or Extra Strength Oxalid   Bufferin Arthritis Strength Feldene Oxphenbutazone   Bufferin plain or Extra Strength Feldene Capsules Oxycodone with Aspirin   Bufferin with Codeine Fenoprofen Fenoprofen Pabalate or Pabalate-SF   Buffets II Flogesic Panagesic   Buffinol plain or Extra Strength Florinal or Florinal with Codeine Panwarfarin   Buf-Tabs Flurbiprofen Penicillamine   Butalbital Compound Four-way cold tablets Penicillin   Butazolidin Fragmin Pepto-Bismol   Carbenicillin Geminisyn Percodan   Carna Arthritis Reliever Geopen Persantine    Carprofen Gold's salt Persistin   Chloramphenicol Goody's Phenylbutazone   Chloromycetin Haltrain Piroxlcam   Clmetidine heparin Plaquenil   Cllnoril Hyco-pap Ponstel   Clofibrate Hydroxy chloroquine Propoxyphen  Before stopping any of these medications, be sure to consult the physician who ordered them.  Some, such as Coumadin (Warfarin) are ordered to prevent or treat serious conditions such as "deep thrombosis", "pumonary embolisms", and other heart problems.  The amount of time that you may need off of the medication may also vary with the medication and the reason for which you were taking it.  If you are taking any of these medications, please make sure you notify your pain physician before you undergo any procedures.         Pain Management Discharge Instructions  General Discharge Instructions :  If you need to reach your doctor call: Monday-Friday 8:00 am - 4:00 pm at [phone] or toll free [phone].  After clinic hours [phone] to have operator reach doctor.  Bring all of your medication bottles to all your appointments in the pain clinic.  To cancel or reschedule your appointment with Pain Management please remember to call 24 hours in advance to avoid a fee.  Refer to the educational materials which you have been given on: General Risks, I had my Procedure. Discharge Instructions, Post Sedation.  Post Procedure Instructions:  The drugs you were given will stay in your system until tomorrow, so for the next 24 hours you should not drive, make any legal decisions or drink any alcoholic beverages.  You may eat anything you prefer, but it is better to start with liquids then soups and crackers, and gradually work up to solid foods.  Please notify your doctor immediately if you have any unusual bleeding, trouble breathing or pain that is not related to your normal pain.  Depending on the type of procedure that was done, some parts of your body may  feel week and/or numb.  This usually clears up by tonight or the next day.  Walk with the use of an assistive device or accompanied by an adult for the 24 hours.  You may use ice on the affected area for the first 24 hours.  Put ice in a Ziploc bag and cover with a towel and place against area 15 minutes on 15 minutes off.  You may switch to heat after 24 hours.Epidural Steroid Injection Patient Information  Description: The epidural space surrounds the nerves as they exit the spinal cord.  In some patients, the nerves can be compressed and inflamed by a bulging disc or a tight spinal canal (spinal stenosis).  By injecting steroids into the epidural space, we can bring irritated nerves into direct contact with a potentially helpful medication.  These steroids act directly on the irritated nerves and can reduce swelling and inflammation which often leads to decreased pain.  Epidural steroids may be injected anywhere along the spine and from the neck to the low back depending upon the location of your pain.   After numbing the skin with local anesthetic (like Novocaine), a small needle is passed into the epidural space slowly.  You may experience a sensation of pressure while this is being done.  The entire block usually last less than 10 minutes.  Conditions which may be treated by epidural steroids:   Low back and leg pain  Neck and arm pain  Spinal stenosis  Post-laminectomy syndrome  Herpes zoster (shingles) pain  Pain from compression fractures  Preparation for the injection:  1. Do not eat any solid food or dairy products within 8 hours of your appointment.  2. You may drink clear liquids up to 3 hours before appointment.  Clear liquids include water, black coffee, juice or soda.  No milk or cream please. 3. You may take your regular medication, including pain medications, with a sip of water before your appointment  Diabetics should hold regular insulin (if taken separately) and  take 1/2 normal NPH dos the morning of the procedure.  Carry some sugar containing items with you to your appointment. 4. A driver must accompany you and be prepared to drive you home after your procedure.  5. Bring all your current medications with your. 6. An IV may be inserted and sedation may be given at the discretion of the physician.   7. A blood pressure cuff, EKG and other monitors will often be applied during the procedure.  Some patients may need to have extra oxygen administered for a short period. 8. You will be asked to provide medical information, including your allergies, prior to the procedure.  We must know immediately if you are taking blood thinners (like Coumadin/Warfarin)  Or if you are allergic to IV iodine contrast (dye). We must know if you could possible be pregnant.  Possible side-effects:  Bleeding from needle site  Infection (rare, may require surgery)  Nerve injury (rare)  Numbness & tingling (temporary)  Difficulty urinating (rare, temporary)  Spinal headache ( a headache worse with upright posture)  Light -headedness (temporary)  Pain at injection site (several days)  Decreased blood pressure (temporary)  Weakness in arm/leg (temporary)  Pressure sensation in back/neck (temporary)  Call if you experience:  Fever/chills associated with headache or increased back/neck pain.  Headache worsened by an upright position.  New onset weakness or numbness of an extremity below the injection site  Hives or difficulty breathing (go to the emergency room)  Inflammation or drainage at the infection site  Severe back/neck pain  Any new symptoms which are concerning to you  Please note:  Although the local anesthetic injected can often make your back or neck feel good for several hours after the injection, the pain will likely return.  It takes 3-7 days for steroids to work in the epidural space.  You may not notice any pain relief for at least that  one week.  If effective, we will often do a series of three injections spaced 3-6 weeks apart to maximally decrease your pain.  After the initial series, we generally will wait several months before considering a repeat injection of the same type.  If you have any questions, please call [phone] [facility]

## 2020-02-05 ENCOUNTER — Telehealth: Payer: Self-pay

## 2020-02-05 NOTE — Telephone Encounter (Signed)
Post procedure phone call.  LM 

## 2020-02-07 ENCOUNTER — Telehealth: Payer: Self-pay

## 2020-02-07 NOTE — Telephone Encounter (Signed)
pt called states that the medication is working fine and she feels great. just wanted to let you know.

## 2020-02-17 NOTE — Progress Notes (Signed)
PROVIDER NOTE: Information contained herein reflects review and annotations entered in association with encounter. Interpretation of such information and data should be left to medically-trained personnel. Information provided to patient can be located elsewhere in the medical record under "Patient Instructions". Document created using STT-dictation technology, any transcriptional errors that may result from process are unintentional.    Patient: Laura [name]  Service Category: E/M  Provider: Gaspar Cola, MD  DOB: 09/14/1945  DOS: 02/19/2020  Specialty: Interventional Pain Management  MRN: [id number]  Setting: Ambulatory outpatient  PCP: Adin Hector, MD  Type: Established Patient    Referring Provider: Adin Hector, MD  Location: Office  Delivery: Face-to-face     HPI  Laura [name], a 74 y.o. year old female, is here today because of her Chronic pain syndrome [G89.4]. Laura [name]'s primary complain today is No chief complaint on file. Last encounter: My last encounter with her was on 02/04/2020. 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 Pain Assessment: Severity of Chronic pain is reported as a 2 /10. Location: Back Lower/denies since procedure. Onset: More than a month ago. Quality: Aching,Constant. Timing: Constant. Modifying factor(s): procedure, PT Bid. Vitals:  height is '5\' 5"'  (1.651 m) and weight is 270 lb (122.5 kg). Her temporal temperature is 97 F (36.1 C) (abnormal). Her blood pressure is 141/78 (abnormal) and her pulse is 63. Her respiration is 16 and oxygen saturation is 100%.   Reason for encounter: post-procedure assessment.  Today I had a very long conversation with the patient and her daughter regarding her condition.  We went over the results of the MRI and we have decided to send her to have a neurosurgical evaluation.  Meanwhile, I will have the patient come back for a repeat lumbar epidural steroid injection, but this time I will go to the L3-4 level.  She indicates that her leg pain is gone, but she still has some numbness.  We talked about losing bowel or bladder control and how that would represent a surgical emergency and what she would need to do.  We talked about her weight and how that has influenced the issues that she has in her spine.  We also talked about the low back pain versus the lower extremity pain and how the surgery would help primarily the issue with the lower extremity pain, numbness, and weakness.  She has improved since her lumbar epidural steroid injection, but we still have some ways to go.  RTCB: 04/08/2020 (prescriptions last written by Dr. Vashti Hey)  The patient indicates doing well with the current medication regimen. No adverse reactions or side effects reported to the medications.   Post-Procedure Evaluation  Procedure (02/04/2020): Diagnostic/therapeutic left L4-5 LESI #1 under fluoroscopic guidance no sedation  Pre-procedure pain level: 7/10 Post-procedure: 7/10 No initial benefit, possibly due to  rapid discharge after no sedation procedure, without enough time to allow full onset of block.  Sedation: None.  Effectiveness during initial hour after procedure(Ultra-Short Term Relief): 100 %.  Local anesthetic used: Long-acting (4-6 hours) Effectiveness: Defined as any analgesic benefit obtained secondary to the administration of local anesthetics. This carries significant diagnostic value as to the etiological location, or anatomical origin, of the pain. Duration of benefit is expected to coincide with the duration of the local anesthetic used.  Effectiveness during initial 4-6 hours after procedure(Short-Term Relief): 100 %.  Long-term benefit: Defined as any relief past the pharmacologic duration of the local anesthetics.  Effectiveness past the initial 6 hours after procedure(Long-Term Relief): 85 % (current).  Current benefits: Defined as benefit that persist at this time.   Analgesia:  85% ongoing improvement in her pain.  She refers that she no longer has the hip pain or the lower extremity pain the only pain that remains is in the lower back and she is rated as a 1/10. Function: Laura [name] reports improvement in function ROM: Laura [name] reports improvement in ROM  Pharmacotherapy Assessment   Analgesic: Oxycodone IR 5 mg every 6 hours (20 mg/day) MME/day:30 mg/day.   Monitoring: [place] PMP: PDMP reviewed during this encounter.       Pharmacotherapy: No side-effects or adverse reactions reported. Compliance: No problems identified. Effectiveness: Clinically acceptable.  Ignatius Specking, RN  02/19/2020  1:34 PM  Sign when Signing Visit Safety precautions to be maintained throughout the outpatient stay will include: orient to surroundings, keep bed in low position, maintain call bell within reach at all times, provide assistance with transfer out of bed and ambulation.     UDS:  Summary  Date Value Ref Range Status  07/16/2019 Note  Final    Comment:     ==================================================================== [employer] [facility] [employer] (MW) ==================================================================== Test                             Result       Flag       Units  Drug Present and Declared for Prescription Verification   Oxycodone                      1635         EXPECTED   ng/mg creat   Oxymorphone                    521          EXPECTED   ng/mg creat   Noroxycodone                   1941         EXPECTED   ng/mg creat   Noroxymorphone                 174          EXPECTED   ng/mg creat    Sources of oxycodone are scheduled prescription medications.    Oxymorphone, noroxycodone, and noroxymorphone are expected    metabolites of oxycodone. Oxymorphone is also available as a    scheduled prescription medication.  Drug Present not Declared for Prescription Verification   Tramadol                       136  UNEXPECTED ng/mg creat   O-Desmethyltramadol            53           UNEXPECTED ng/mg creat   N-Desmethyltramadol            77           UNEXPECTED ng/mg creat    Source of tramadol is a prescription medication. O-desmethyltramadol    and N-desmethyltramadol are expected metabolites of tramadol.  ==================================================================== Test                      Result    Flag   Units      Ref Range   Creatinine              103              mg/dL      >=20 ==================================================================== Declared Medications:  The flagging and interpretation on this report are based on the  following declared medications.  Unexpected results may arise from  inaccuracies in the declared medications.   **Note: The testing scope of this panel includes these medications:   Oxycodone   **Note: The testing scope of this panel does not include the  following reported medications:   Albuterol  Amlodipine  Aspirin  Azelastine  Chlorthalidone  Duloxetine  Folic  Acid  Hydroxychloroquine (Plaquenil)  Infliximab (Remicade)  Levothyroxine  Loratadine (Claritin)  Methotrexate  Metoprolol  Mirtazapine  Multivitamin  Rosuvastatin  Simvastatin (Zocor)  Spironolactone  Valacyclovir (Valtrex) ==================================================================== For clinical consultation, please call [phone]. ====================================================================      ROS  Constitutional: Denies any fever or chills Gastrointestinal: No reported hemesis, hematochezia, vomiting, or acute GI distress Musculoskeletal: Denies any acute onset joint swelling, redness, loss of ROM, or weakness Neurological: No reported episodes of acute onset apraxia, aphasia, dysarthria, agnosia, amnesia, paralysis, loss of coordination, or loss of consciousness  Medication Review  DULoxetine, Multi-Vitamins, Vitamin D, albuterol, aspirin EC, azelastine, chlorthalidone, folic acid, hydrOXYzine, hydroxychloroquine, inFLIXimab, levothyroxine, loratadine, methotrexate, metoprolol succinate, oxyCODONE, rosuvastatin, spironolactone, valACYclovir, and zaleplon  History Review  Allergy: Laura [name] is allergic to bupropion and penicillins. Drug: Laura [name]  reports no history of drug use. Alcohol:  reports no history of alcohol use. Tobacco:  reports that she has never smoked. She has never used smokeless tobacco. Social: Laura [name]  reports that she has never smoked. She has never used smokeless tobacco. She reports that she does not drink alcohol and does not use drugs. Medical:  has a past medical history of Allergy, Anxiety, Arthritis, degenerative (10/08/2013), Asthma, Chronic hand pain, left (10/10/2019), Chronic kidney disease, Depression, Lumbar spinal stenosis with neurogenic claudication (11/07/2014), Major depression, single episode, in complete remission ([place]) (06/25/2015), Memory loss, short term (03/19/2014), Sciatica of left side (12/31/2019),  Seizure ([place]) (10/07/2014), Sleep apnea, Sleep apnea, and Thyroid disease. Surgical: Laura [name]  has a past surgical history that includes Abdominal hysterectomy; Cesarean section; Replacement total knee (Left); and Hip surgery (Right). Family: family history includes Alcohol abuse in her father; Depression in her father and sister; Heart attack in her father; Hypertension in her mother and sister; Post-traumatic stress disorder in her father; Rheum arthritis in her sister; Stroke in her mother.  Laboratory Chemistry Profile   Renal Lab Results  Component Value Date   BUN 24 (H) 10/03/2019   CREATININE 1.10 (H) 10/03/2019   GFRAA 57 (L) 10/03/2019   GFRNONAA 49 (L) 10/03/2019     Hepatic Lab  Results  Component Value Date   AST 23 10/03/2019   ALT 16 10/03/2019   ALBUMIN 4.4 10/03/2019   ALKPHOS 55 10/03/2019     Electrolytes Lab Results  Component Value Date   NA 136 10/03/2019   K 4.2 10/03/2019   CL 100 10/03/2019   CALCIUM 9.0 10/03/2019   MG 2.3 10/08/2018     Bone Lab Results  Component Value Date   25OHVITD1 36 07/15/2015   25OHVITD2 3.6 07/15/2015   25OHVITD3 32 07/15/2015     Inflammation (CRP: Acute Phase) (ESR: Chronic Phase) Lab Results  Component Value Date   CRP 1.9 (H) 07/15/2015   ESRSEDRATE 32 (H) 07/15/2015   LATICACIDVEN 1.4 10/07/2018       Note: Above Lab results reviewed.  Recent Imaging Review  DG PAIN CLINIC C-ARM 1-60 MIN NO REPORT Fluoro was used, but no Radiologist interpretation will be provided.  Please refer to "NOTES" tab for provider progress note. Note: Reviewed        Physical Exam  General appearance: Well nourished, well developed, and well hydrated. In no apparent acute distress Mental status: Alert, oriented x 3 (person, place, & time)       Respiratory: No evidence of acute respiratory distress Eyes: PERLA Vitals: BP (!) 141/78 (BP Location: Right Arm, Patient Position: Sitting, Cuff Size: Large)   Pulse 63   Temp  (!) 97 F (36.1 C) (Temporal)   Resp 16   Ht '5\' 5"'  (1.651 m)   Wt 270 lb (122.5 kg)   SpO2 100%   BMI 44.93 kg/m  BMI: Estimated body mass index is 44.93 kg/m as calculated from the following:   Height as of this encounter: '5\' 5"'  (1.651 m).   Weight as of this encounter: 270 lb (122.5 kg). Ideal: Ideal body weight: 57 kg (125 lb 10.6 oz) Adjusted ideal body weight: 83.2 kg (183 lb 6.4 oz)  Assessment   Status Diagnosis  Controlled Controlled Controlled 1. Chronic pain syndrome   2. Chronic lower extremity pain (Left)   3. Chronic low back pain (1ry area of Pain) (Bilateral) (L>R) w/ sciatica (Bilateral)   4. Chronic hip pain (Left)   5. 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 Pharmacologic therapy   8. Uncomplicated opioid dependence ([place])   9. Abnormal MRI, lumbar spine (12/26/2019)      Updated Problems: Problem  Abnormal MRI, lumbar spine (12/26/2019)   FINDINGS: Alignment: Unchanged grade 1 anterolisthesis of L3 on L4 and L4 on L5, facet mediated. Vertebrae: No acute fracture or suspicious osseous lesion. New L2 superior endplate Schmorl's node without edema. Unchanged mild central endplate compression deformities in the lower lumbar spine. Conus medullaris and cauda equina: Conus extends to the upper L1 level and appears normal. Chronic redundancy of the cauda equina secondary to high-grade spinal stenosis. Paraspinal and other soft tissues: Small bilateral renal cysts.  Disc levels: Disc desiccation throughout the lumbar spine. Diffuse congenital narrowing of the lumbar spinal canal due to short pedicles. T12-L1: Mild facet arthrosis without disc herniation or stenosis. L1-2: Mild disc bulging and moderate facet and ligamentum flavum hypertrophy result in new mild spinal stenosis without neural foraminal stenosis. L2-3: Disc bulging and moderate facet and ligamentum  flavum hypertrophy result in moderate spinal stenosis, mild right and mild-to-moderate left lateral recess stenosis, and mild right and moderate left neural foraminal stenosis, slightly progressed. L3-4: Anterolisthesis  with bulging uncovered disc and severe facet and ligamentum flavum hypertrophy result in severe spinal stenosis, severe bilateral lateral recess stenosis, and severe right and moderate left neural foraminal stenosis, not significantly changed. L4-5: Anterolisthesis with bulging uncovered disc and severe facet hypertrophy result in moderate spinal stenosis, moderate to severe bilateral lateral recess stenosis, and moderate bilateral neural foraminal stenosis, not significantly changed. L5-S1: Mild disc bulging eccentric to the right and mild to moderate facet arthrosis result in mild right neural foraminal stenosis without spinal stenosis, unchanged.  IMPRESSION: 1. Slight progression of moderate spinal stenosis at L2-3. 2. New mild spinal stenosis at L1-2. 3. Unchanged severe spinal stenosis and severe right and moderate left neural foraminal stenosis at L3-4. 4. Unchanged moderate spinal stenosis and moderate neural foraminal stenosis at L4-5.     Plan of Care  Problem-specific:  No problem-specific Assessment & Plan notes found for this encounter.  Laura [name] has a current medication list which includes the following long-term medication(s): albuterol, azelastine, chlorthalidone, duloxetine, infliximab, levothyroxine, levothyroxine, loratadine, metoprolol succinate, oxycodone, [START ON 03/09/2020] oxycodone, spironolactone, and zaleplon.  Pharmacotherapy (Medications Ordered): No orders of the defined types were placed in this encounter.  Orders:  Orders Placed This Encounter  Procedures  . Lumbar Epidural Injection    Standing Status:   Future    Standing Expiration Date:   03/18/2020    Scheduling Instructions:     Procedure: Interlaminar Lumbar Epidural  Steroid injection (LESI)  L2-3     Laterality: Midline     Sedation: Patient's choice.     Timeframe: ASAA    Order Specific Question:   Where will this procedure be performed?    Answer:   [facility]  . Ambulatory referral to Neurosurgery    Referral Priority:   Routine    Referral Type:   Surgical    Referral Reason:   Specialty Services Required    Requested Specialty:   Neurosurgery    Number of Visits Requested:   1   Follow-up plan:   Return for Procedure (w/ sedation): (ML) L2-3 LESI #2.      Considering:   Therapeutic right IA Hyalgan knee injections  Diagnostic left hip IA joint injection  Possible left hip RFA  Diagnostic right IA knee joint injection (w/ steroid)  Diagnostic bilateral genicular NB  Possible bilateral genicular nerve RFA  Diagnostic bilateral lumbar facet block  Possible bilateral lumbar facet RFA  Diagnostic bilateral L3 and/or L5 TFESI  Diagnostic L3-4 vs L4-5 LESI    Palliative PRN treatment(s):   Therapeutic right IA Hyalgan knee injections Palliative left IA hip joint injection  Diagnostic left lumbar facet block #2      Recent Visits Date Type Provider Dept  02/04/20 Procedure visit Milinda Pointer, MD [facility]  01/20/20 Office Visit Milinda Pointer, MD [facility]  12/31/19 Telemedicine Molli Barrows, MD [facility]  Showing recent visits within past 90 days and meeting all other requirements Today's Visits Date Type Provider Dept  02/19/20 Office Visit Milinda Pointer, MD [facility]  Showing today's visits and meeting all other requirements Future Appointments Date Type Provider Dept  03/10/20 Appointment Milinda Pointer, MD [facility]  Showing future appointments within next 90 days and meeting all other requirements  I discussed the assessment and treatment plan with the patient. The patient was provided an opportunity to ask questions and all were  answered. The patient agreed with the plan and demonstrated an understanding  of the instructions.  Patient advised to call back or seek an in-person evaluation if the symptoms or condition worsens.  Duration of encounter: 30 minutes.  Note by: Gaspar Cola, MD Date: 02/19/2020; Time: 2:18 PM

## 2020-02-19 ENCOUNTER — Encounter: Payer: Self-pay | Admitting: Psychiatry

## 2020-02-19 ENCOUNTER — Encounter: Payer: Self-pay | Admitting: Pain Medicine

## 2020-02-19 ENCOUNTER — Ambulatory Visit: Payer: Medicare Other | Attending: Pain Medicine | Admitting: Pain Medicine

## 2020-02-19 ENCOUNTER — Telehealth (INDEPENDENT_AMBULATORY_CARE_PROVIDER_SITE_OTHER): Payer: Medicare Other | Admitting: Psychiatry

## 2020-02-19 ENCOUNTER — Other Ambulatory Visit: Payer: Self-pay

## 2020-02-19 VITALS — BP 141/78 | HR 63 | Temp 97.0°F | Resp 16 | Ht 65.0 in | Wt 270.0 lb

## 2020-02-19 DIAGNOSIS — I639 Cerebral infarction, unspecified: Secondary | ICD-10-CM | POA: Diagnosis not present

## 2020-02-19 DIAGNOSIS — M25552 Pain in left hip: Secondary | ICD-10-CM | POA: Diagnosis not present

## 2020-02-19 DIAGNOSIS — F1120 Opioid dependence, uncomplicated: Secondary | ICD-10-CM | POA: Diagnosis present

## 2020-02-19 DIAGNOSIS — G8929 Other chronic pain: Secondary | ICD-10-CM | POA: Diagnosis present

## 2020-02-19 DIAGNOSIS — M48061 Spinal stenosis, lumbar region without neurogenic claudication: Secondary | ICD-10-CM

## 2020-02-19 DIAGNOSIS — M79605 Pain in left leg: Secondary | ICD-10-CM | POA: Diagnosis not present

## 2020-02-19 DIAGNOSIS — F5101 Primary insomnia: Secondary | ICD-10-CM

## 2020-02-19 DIAGNOSIS — R937 Abnormal findings on diagnostic imaging of other parts of musculoskeletal system: Secondary | ICD-10-CM | POA: Diagnosis present

## 2020-02-19 DIAGNOSIS — M5442 Lumbago with sciatica, left side: Secondary | ICD-10-CM | POA: Insufficient documentation

## 2020-02-19 DIAGNOSIS — Z79899 Other long term (current) drug therapy: Secondary | ICD-10-CM

## 2020-02-19 DIAGNOSIS — M5441 Lumbago with sciatica, right side: Secondary | ICD-10-CM | POA: Insufficient documentation

## 2020-02-19 DIAGNOSIS — F3342 Major depressive disorder, recurrent, in full remission: Secondary | ICD-10-CM

## 2020-02-19 DIAGNOSIS — G894 Chronic pain syndrome: Secondary | ICD-10-CM

## 2020-02-19 DIAGNOSIS — M48062 Spinal stenosis, lumbar region with neurogenic claudication: Secondary | ICD-10-CM

## 2020-02-19 HISTORY — DX: Abnormal findings on diagnostic imaging of other parts of musculoskeletal system: R93.7

## 2020-02-19 MED ORDER — ZALEPLON 5 MG PO CAPS: 5.0000 mg | ORAL_CAPSULE | Freq: Every evening | ORAL | 1 refills | Status: DC | PRN

## 2020-02-19 NOTE — Patient Instructions (Addendum)
____________________________________________________________________________________________  Preparing for Procedure with Sedation  Procedure appointments are limited to planned procedures: . No Prescription Refills. . No disability issues will be discussed. . No medication changes will be discussed.  Instructions: . Oral Intake: Do not eat or drink anything for at least 8 hours prior to your procedure. (Exception: Blood Pressure Medication. See below.) . Transportation: Unless otherwise stated by your physician, you may drive yourself after the procedure. . Blood Pressure Medicine: Do not forget to take your blood pressure medicine with a sip of water the morning of the procedure. If your Diastolic (lower reading)is above 100 mmHg, elective cases will be cancelled/rescheduled. . Blood thinners: These will need to be stopped for procedures. Notify our staff if you are taking any blood thinners. Depending on which one you take, there will be specific instructions on how and when to stop it. . Diabetics on insulin: Notify the staff so that you can be scheduled 1st case in the morning. If your diabetes requires high dose insulin, take only  of your normal insulin dose the morning of the procedure and notify the staff that you have done so. . Preventing infections: Shower with an antibacterial soap the morning of your procedure. . Build-up your immune system: Take 1000 mg of Vitamin C with every meal (3 times a day) the day prior to your procedure. . Antibiotics: Inform the staff if you have a condition or reason that requires you to take antibiotics before dental procedures. . Pregnancy: If you are pregnant, call and cancel the procedure. . Sickness: If you have a cold, fever, or any active infections, call and cancel the procedure. . Arrival: You must be in the facility at least 30 minutes prior to your scheduled procedure. . Children: Do not bring children with you. . Dress appropriately:  Bring dark clothing that you would not mind if they get stained. . Valuables: Do not bring any jewelry or valuables.  Reasons to call and reschedule or cancel your procedure: (Following these recommendations will minimize the risk of a serious complication.) . Surgeries: Avoid having procedures within 2 weeks of any surgery. (Avoid for 2 weeks before or after any surgery). . Flu Shots: Avoid having procedures within 2 weeks of a flu shots or . (Avoid for 2 weeks before or after immunizations). . Barium: Avoid having a procedure within 7-10 days after having had a radiological study involving the use of radiological contrast. (Myelograms, Barium swallow or enema study). . Heart attacks: Avoid any elective procedures or surgeries for the initial 6 months after a "Myocardial Infarction" (Heart Attack). . Blood thinners: It is imperative that you stop these medications before procedures. Let us know if you if you take any blood thinner.  . Infection: Avoid procedures during or within two weeks of an infection (including chest colds or gastrointestinal problems). Symptoms associated with infections include: Localized redness, fever, chills, night sweats or profuse sweating, burning sensation when voiding, cough, congestion, stuffiness, runny nose, sore throat, diarrhea, nausea, vomiting, cold or Flu symptoms, recent or current infections. It is specially important if the infection is over the area that we intend to treat. . Heart and lung problems: Symptoms that may suggest an active cardiopulmonary problem include: cough, chest pain, breathing difficulties or shortness of breath, dizziness, ankle swelling, uncontrolled high or unusually low blood pressure, and/or palpitations. If you are experiencing any of these symptoms, cancel your procedure and contact your primary care physician for an evaluation.  Remember:  Regular Business hours are:    Monday to Thursday 8:00 AM to 4:00 PM  Provider's  Schedule: Delano Metz, MD:  Procedure days: Tuesday and Thursday 7:30 AM to 4:00 PM  Edward Jolly, MD:  Procedure days: Monday and Wednesday 7:30 AM to 4:00 PM ____________________________________________________________________________________________   ____________________________________________________________________________________________  Preparing for your procedure (without sedation)  Procedure appointments are limited to planned procedures: . No Prescription Refills. . No disability issues will be discussed. . No medication changes will be discussed.  Instructions: . Oral Intake: Do not eat or drink anything for at least 6 hours prior to your procedure. (Exception: Blood Pressure Medication. See below.) . Transportation: Unless otherwise stated by your physician, you may drive yourself after the procedure. . Blood Pressure Medicine: Do not forget to take your blood pressure medicine with a sip of water the morning of the procedure. If your Diastolic (lower reading)is above 100 mmHg, elective cases will be cancelled/rescheduled. . Blood thinners: These will need to be stopped for procedures. Notify our staff if you are taking any blood thinners. Depending on which one you take, there will be specific instructions on how and when to stop it. . Diabetics on insulin: Notify the staff so that you can be scheduled 1st case in the morning. If your diabetes requires high dose insulin, take only  of your normal insulin dose the morning of the procedure and notify the staff that you have done so. . Preventing infections: Shower with an antibacterial soap the morning of your procedure.  . Build-up your immune system: Take 1000 mg of Vitamin C with every meal (3 times a day) the day prior to your procedure. Marland Kitchen Antibiotics: Inform the staff if you have a condition or reason that requires you to take antibiotics before dental procedures. . Pregnancy: If you are pregnant, call and cancel  the procedure. . Sickness: If you have a cold, fever, or any active infections, call and cancel the procedure. . Arrival: You must be in the facility at least 30 minutes prior to your scheduled procedure. . Children: Do not bring any children with you. . Dress appropriately: Bring dark clothing that you would not mind if they get stained. . Valuables: Do not bring any jewelry or valuables.  Reasons to call and reschedule or cancel your procedure: (Following these recommendations will minimize the risk of a serious complication.) . Surgeries: Avoid having procedures within 2 weeks of any surgery. (Avoid for 2 weeks before or after any surgery). . Flu Shots: Avoid having procedures within 2 weeks of a flu shots or . (Avoid for 2 weeks before or after immunizations). . Barium: Avoid having a procedure within 7-10 days after having had a radiological study involving the use of radiological contrast. (Myelograms, Barium swallow or enema study). . Heart attacks: Avoid any elective procedures or surgeries for the initial 6 months after a "Myocardial Infarction" (Heart Attack). . Blood thinners: It is imperative that you stop these medications before procedures. Let us know if you if you take any blood thinner.  . Infection: Avoid procedures during or within two weeks of an infection (including chest colds or gastrointestinal problems). Symptoms associated with infections include: Localized redness, fever, chills, night sweats or profuse sweating, burning sensation when voiding, cough, congestion, stuffiness, runny nose, sore throat, diarrhea, nausea, vomiting, cold or Flu symptoms, recent or current infections. It is specially important if the infection is over the area that we intend to treat. Marland Kitchen Heart and lung problems: Symptoms that may suggest an active cardiopulmonary problem include: cough,  chest pain, breathing difficulties or shortness of breath, dizziness, ankle swelling, uncontrolled high or  unusually low blood pressure, and/or palpitations. If you are experiencing any of these symptoms, cancel your procedure and contact your primary care physician for an evaluation.  Remember:  Regular Business hours are:  Monday to Thursday 8:00 AM to 4:00 PM  Provider's Schedule: Delano Metz, MD:  Procedure days: Tuesday and Thursday 7:30 AM to 4:00 PM  Edward Jolly, MD:  Procedure days: Monday and Wednesday 7:30 AM to 4:00 PM ____________________________________________________________________________________________   Epidural Steroid Injection  An epidural steroid injection is a shot of steroid medicine and numbing medicine that is given into the space between the spinal cord and the bones of the back (epidural space). The shot helps relieve pain caused by an irritated or swollen nerve root. The amount of pain relief you get from the injection depends on what is causing the nerve to be swollen and irritated, and how long your pain lasts. You are more likely to benefit from this injection if your pain is strong and comes on suddenly rather than if you have had long-term (chronic) pain. Tell a health care provider about:  Any allergies you have.  All medicines you are taking, including vitamins, herbs, eye drops, creams, and over-the-counter medicines.  Any problems you or family members have had with anesthetic medicines.  Any blood disorders you have.  Any surgeries you have had.  Any medical conditions you have.  Whether you are pregnant or may be pregnant. What are the risks? Generally, this is a safe procedure. However, problems may occur, including:  Headache.  Bleeding.  Infection.  Allergic reaction to medicines.  Nerve damage. What happens before the procedure? Staying hydrated Follow instructions from your health care provider about hydration, which may include:  Up to 2 hours before the procedure - you may continue to drink clear liquids, such as  water, clear fruit juice, black coffee, and plain tea. Eating and drinking restrictions Follow instructions from your health care provider about eating and drinking, which may include:  8 hours before the procedure - stop eating heavy meals or foods, such as meat, fried foods, or fatty foods.  6 hours before the procedure - stop eating light meals or foods, such as toast or cereal.  6 hours before the procedure - stop drinking milk or drinks that contain milk.  2 hours before the procedure - stop drinking clear liquids. Medicines  You may be given medicines to lower anxiety.  Ask your health care provider about: ? Changing or stopping your regular medicines. This is especially important if you are taking diabetes medicines or blood thinners. ? Taking medicines such as aspirin and ibuprofen. These medicines can thin your blood. Do not take these medicines unless your health care provider tells you to take them. ? Taking over-the-counter medicines, vitamins, herbs, and supplements. General instructions  Ask your health care provider what steps will be taken to prevent infection.  Plan to have a responsible adult take you home from the hospital or clinic.  If you will be going home right after the procedure, plan to have a responsible adult care for you for the time you are told. This is important. What happens during the procedure?  An IV will be inserted into one of your veins.  You will be given one or more of the following: ? A medicine to help you relax (sedative). ? A medicine to numb the area (local anesthetic).  You will  be asked to lie on your abdomen or sit.  The injection site will be cleaned.  A needle will be inserted through your skin into the epidural space. This may cause you some discomfort. An X-ray machine will be used to guide the needle as close as possible to the affected nerve.  A steroid medicine and a local anesthetic will be injected into the epidural  space.  The needle and IV will be removed.  A bandage (dressing) will be put over the injection site. The procedure may vary among health care providers and hospitals. What can I expect after the procedure?  Your blood pressure, heart rate, breathing rate, and blood oxygen level will be monitored until you leave the hospital or clinic.  Your arm or leg may feel weak or numb for a few hours.  The injection site may feel sore. Follow these instructions at home: Injection site care  You may remove the bandage (dressing) after 24 hours.  Check your injection site every day for signs of infection. Check for: ? Redness, swelling, or pain. ? Fluid or blood. ? Warmth. ? Pus or a bad smell. Managing pain, stiffness, and swelling  For 24 hours after the procedure: ? Avoid using heat on the injection site. ? Do not take baths, swim, or use a hot tub until your health care provider approves. Ask your health care provider if you may take showers. You may only be allowed to take sponge baths.   If directed, put ice on the injection site. To do this: ? Put ice in a plastic bag. ? Place a towel between your skin and the bag. ? Leave the ice on for 20 minutes, 2-3 times a day.   Activity  If you were given a sedative during the procedure, it can affect you for several hours. Do not drive or operate machinery until your health care provider says that it is safe.  Return to your normal activities as told by your health care provider. Ask your health care provider what activities are safe for you. General instructions  Take over-the-counter and prescription medicines only as told by your health care provider.  Drink enough fluid to keep your urine pale yellow.  Keep all follow-up visits as told by your health care provider. This is important. Contact a health care provider if:  You have any of these signs of infection: ? Redness, swelling, or pain around your injection site. ? Fluid or  blood coming from your injection site. ? Warmth coming from your injection site. ? Pus or a bad smell coming from your injection site. ? A fever.  You continue to have pain and soreness around the injection site, even after taking over-the-counter pain medicine.  You have severe, sudden, or lasting nausea or vomiting. Get help right away if:  You have severe pain at the injection site that is not relieved by medicines.  You develop a severe headache or a stiff neck.  You become sensitive to light.  You have any new numbness or weakness in your legs or arms.  You lose control of your bladder or bowel movements.  You have trouble breathing. Summary  An epidural steroid injection is a shot of steroid medicine and numbing medicine that is given into the epidural space.  The shot helps relieve pain caused by an irritated or swollen nerve root.  You are more likely to benefit from this injection if your pain is strong and comes on suddenly rather than if  you have had chronic pain. This information is not intended to replace advice given to you by your health care provider. Make sure you discuss any questions you have with your health care provider. Document Revised: 04/26/2019 Document Reviewed: 07/09/2018 [employer] Patient Education  2021 [employer].

## 2020-02-19 NOTE — Progress Notes (Signed)
Safety precautions to be maintained throughout the outpatient stay will include: orient to surroundings, keep bed in low position, maintain call bell within reach at all times, provide assistance with transfer out of bed and ambulation.  

## 2020-02-19 NOTE — Progress Notes (Unsigned)
Virtual Visit via Telephone Note  I connected with Laura [name] on 02/19/20 at 11:20 AM EST by telephone and verified that I am speaking with the correct person using two identifiers.  Location Provider Location : [facility] Patient Location : Home  Participants: Patient , Provider   I discussed the limitations, risks, security and privacy concerns of performing an evaluation and management service by telephone and the availability of in person appointments. I also discussed with the patient that there may be a patient responsible charge related to this service. The patient expressed understanding and agreed to proceed. I discussed the assessment and treatment plan with the patient. The patient was provided an opportunity to ask questions and all were answered. The patient agreed with the plan and demonstrated an understanding of the instructions.   The patient was advised to call back or seek an in-person evaluation if the symptoms worsen or if the condition fails to improve as anticipated. [facility] MD OP Progress Note  02/19/2020 12:54 PM Quiera Diffee  MRN:  [id number]  Chief Complaint:  Chief Complaint    Follow-up     HPI: Laura [name] is a 75 year old Caucasian female who has a history of MDD, primary insomnia, chronic pain was evaluated by telemedicine today.  Patient today reports she is currently making progress with regards to her sleep.  She reports the Sonata as beneficial.  She reports she still wakes up in the middle of the night since she has to urinate.  Patient however reports overall her sleep has improved and she feels rested when she wakes up in the morning.  She denies side effects.  Patient denies any depression or anxiety symptoms at this time.  Patient denies any suicidality or homicidality or perceptual disturbances.  Patient does struggle with back pain radiating to her legs however reports she got an injection at her pain management clinic recently which has  helped.  She currently rates her pain at a 4 out of 10, 10 being the worst.  She reports since her pain is more under control she is able to manage daily activities better although she has to be cautious.  Patient denies any other concerns today.  Visit Diagnosis:    ICD-10-CM   1. MDD (major depressive disorder), recurrent, in full remission (HCC)  F33.42   2. Primary insomnia  F51.01 zaleplon (SONATA) 5 MG capsule    Past Psychiatric History: I have reviewed past psychiatric history from my progress note on 06/13/2018.  Past Medical History:  Past Medical History:  Diagnosis Date  . Allergy   . Anxiety   . Arthritis, degenerative 10/08/2013   Overview:    a.  Lumbar spine/spinal stenosis/foot drop.   b.  Hands.   . Asthma   . Chronic hand pain, left 10/10/2019  . Chronic kidney disease   . Depression   . 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 Major depression, single episode, in complete remission (HCC) 06/25/2015  . Memory loss, short term 03/19/2014  . Sciatica of left side 12/31/2019  . Seizure (HCC) 10/07/2014  . Sleep apnea   . Sleep apnea   . Thyroid disease     Past Surgical History:  Procedure Laterality Date  . ABDOMINAL HYSTERECTOMY    . CESAREAN SECTION    . HIP SURGERY Right   . REPLACEMENT TOTAL KNEE Left     Family Psychiatric History: I have reviewed family psychiatric history from my progress note on 06/13/2018.  Family History:  Family  History  Problem Relation Age of Onset  . Hypertension Mother   . Stroke Mother   . Heart attack Father   . Alcohol abuse Father   . Depression Father   . Post-traumatic stress disorder Father   . Rheum arthritis Sister   . Hypertension Sister   . Depression Sister   . Breast cancer Neg Hx     Social History: I have reviewed social history from my progress note on 06/13/2018. Social History   Socioeconomic History  . Marital status: Married    Spouse name: Not on file  . Number of  children: Not on file  . Years of education: Not on file  . Highest education level: Not on file  Occupational History    Comment: retired  Tobacco Use  . Smoking status: Never Smoker  . Smokeless tobacco: Never Used  Vaping Use  . Vaping Use: Never used  Substance and Sexual Activity  . Alcohol use: No    Alcohol/week: 0.0 standard drinks  . Drug use: No  . Sexual activity: Not Currently  Other Topics Concern  . Not on file  Social History Narrative  . Not on file   Social Determinants of Health   Financial Resource Strain: Not on file  Food Insecurity: Not on file  Transportation Needs: Not on file  Physical Activity: Not on file  Stress: Not on file  Social Connections: Not on file    Allergies:  Allergies  Allergen Reactions  . Bupropion   . Penicillins Rash    Metabolic Disorder Labs: No results found for: HGBA1C, MPG No results found for: PROLACTIN No results found for: CHOL, TRIG, HDL, CHOLHDL, VLDL, LDLCALC Lab Results  Component Value Date   TSH 0.383 10/07/2018    Therapeutic Level Labs: No results found for: LITHIUM No results found for: VALPROATE No components found for:  CBMZ  Current Medications: Current Outpatient Medications  Medication Sig Dispense Refill  . albuterol (VENTOLIN HFA) 108 (90 Base) MCG/ACT inhaler Inhale 2 puffs into the lungs every 6 (six) hours as needed for wheezing or shortness of breath. 18 g 3  . aspirin EC 81 MG tablet Take by mouth.    Marland Kitchen azelastine (ASTELIN) 0.1 % nasal spray Place into the nose.    . chlorthalidone (HYGROTON) 25 MG tablet TAKE 1/2 TABLET (12.5MG ) BY MOUTH EVERY DAY  11  . Cholecalciferol (VITAMIN D) 2000 UNITS tablet Take by mouth daily.     . DULoxetine (CYMBALTA) 60 MG capsule Take 1 capsule (60 mg total) by mouth daily. 90 capsule 1  . folic acid (FOLVITE) 1 MG tablet Take 1 mg by mouth daily.    . hydroxychloroquine (PLAQUENIL) 200 MG tablet Take 100 mg by mouth daily.     . hydrOXYzine  (VISTARIL) 25 MG capsule Take 1-2 capsules (25-50 mg total) by mouth at bedtime as needed. For sleep 180 capsule 1  . inFLIXimab (REMICADE) 100 MG injection Inject 100 mg into the vein every 8 (eight) weeks.    Marland Kitchen levothyroxine (SYNTHROID) 50 MCG tablet Take 50 mcg by mouth at bedtime.     Marland Kitchen levothyroxine (SYNTHROID, LEVOTHROID) 200 MCG tablet TAKE 1 TABLET ONCE DAILY- ON AN EMPTY STOMACH WITH A GLASS OF WATER 30-60 MINUTES BEFORE BREAKFAST  5  . loratadine (CLARITIN) 10 MG tablet Take 10 mg by mouth daily.     . methotrexate (RHEUMATREX) 2.5 MG tablet TAKE 4 TABLETS (10 MG TOTAL) BY MOUTH EVERY 7 (SEVEN) DAYS WITH A MEAL  5  . metoprolol succinate (TOPROL-XL) 100 MG 24 hr tablet Take 1 tablet (100 mg total) by mouth daily. Take with or immediately following a meal. 30 tablet 0  . Multiple Vitamin (MULTI-VITAMINS) TABS Take by mouth.    . oxyCODONE (OXY IR/ROXICODONE) 5 MG immediate release tablet Take 1 tablet (5 mg total) by mouth every 6 (six) hours as needed for severe pain. 120 tablet 0  . [START ON 03/09/2020] oxyCODONE (OXY IR/ROXICODONE) 5 MG immediate release tablet Take 1 tablet (5 mg total) by mouth every 6 (six) hours as needed for severe pain. Must last 30 days 120 tablet 0  . rosuvastatin (CRESTOR) 5 MG tablet Take 5 mg by mouth 2 (two) times a week.    . spironolactone (ALDACTONE) 25 MG tablet TAKE 1/2 (ONE-HALF) TABLET BY MOUTH DAILY  11  . valACYclovir (VALTREX) 1000 MG tablet PLEASE SEE ATTACHED FOR DETAILED DIRECTIONS    . zaleplon (SONATA) 5 MG capsule Take 1 capsule (5 mg total) by mouth at bedtime as needed for sleep. 30 capsule 1   No current facility-administered medications for this visit.     Musculoskeletal: Strength & Muscle Tone: UTA Gait & Station: UTA Patient leans: N/A  Psychiatric Specialty Exam: Review of Systems  Musculoskeletal: Positive for back pain.  Psychiatric/Behavioral: Positive for sleep disturbance (Improving).  All other systems reviewed and are  negative.   There were no vitals taken for this visit.There is no height or weight on file to calculate BMI.  General Appearance: UTA  Eye Contact:  UTA  Speech:  Clear and Coherent  Volume:  Normal  Mood:  Euthymic  Affect:  UTA  Thought Process:  Goal Directed and Descriptions of Associations: Intact  Orientation:  Full (Time, Place, and Person)  Thought Content: Logical   Suicidal Thoughts:  No  Homicidal Thoughts:  No  Memory:  Immediate;   Fair Recent;   Fair Remote;   Fair  Judgement:  Fair  Insight:  Fair  Psychomotor Activity:  UTA  Concentration:  Concentration: Fair and Attention Span: Fair  Recall:  [employer] of Knowledge: Fair  Language: Fair  Akathisia:  No  Handed:  Right  AIMS (if indicated): UTA  Assets:  Communication Skills Desire for Improvement Housing Intimacy Social Support Talents/Skills Transportation Vocational/Educational  ADL's:  Intact  Cognition: WNL  Sleep:  Improving   Screenings: PHQ2-9   Flowsheet Row Video Visit from 02/19/2020 in [facility] Procedure visit from 02/04/2020 in [facility] Office Visit from 03[facility]14/2018 in [facility] Office Visit from 06[facility]19/2017 in [facility] Office Visit from 05/29/2017 in [facility]  PHQ-2 Total Score 0 0 0 0 2    Flowsheet Row Video Visit from 02/19/2020 in [facility]  C-SSRS RISK CATEGORY No Risk       Assessment and Plan: Dacey Milberger is a 75 year old Caucasian female, married, retired, lives in [place], has a history of depression, currently in remission, primary insomnia, congestive heart failure, GERD, diabetes melitis, hypothyroidism, rheumatoid arthritis, chronic pain, recent diagnosis of foot drop was evaluated by telemedicine today.  Patient is currently making progress with regards to her sleep.  Plan as  noted below.  Plan MDD in remission Cymbalta 60 mg p.o. daily-reduced dosage  Primary insomnia-improving Sonata 5 mg p.o. nightly Patient will benefit from continued pain management. Hydroxyzine 25 to 50 mg p.o. nightly as needed  Patient  will continue to follow-up with her pain providers and has upcoming appointment scheduled.  Follow-up in clinic in 1 month or sooner if needed.  I have spent atleast 15 minutes non face to face  with patient today. More than 50 % of the time was spent for preparing to see the patient ( e.g., review of test, records ),  ordering medications and test ,psychoeducation and supportive psychotherapy and care coordination,as well as documenting clinical information in electronic health record. This note was generated in part or whole with voice recognition software. Voice recognition is usually quite accurate but there are transcription errors that can and very often do occur. I apologize for any typographical errors that were not detected and corrected.        Jomarie Longs, MD 02/20/2020, 8:29 AM

## 2020-02-27 ENCOUNTER — Telehealth: Payer: Self-pay

## 2020-02-27 DIAGNOSIS — F5101 Primary insomnia: Secondary | ICD-10-CM

## 2020-02-27 MED ORDER — ZALEPLON 10 MG PO CAPS
10.0000 mg | ORAL_CAPSULE | Freq: Every evening | ORAL | 1 refills | Status: DC | PRN
Start: 2020-02-27 — End: 2020-03-24

## 2020-02-27 NOTE — Telephone Encounter (Signed)
Returned call to patient.  She reports she is only getting 4 hours of sleep on the Sonata 5 mg.  Discussed increasing to 10 mg.  Discussed the interaction with her medications including oxycodone.  She will talk to her pain provider also.  Patient to monitor herself for side effects.  I have sent Sonata 10 mg to pharmacy.

## 2020-02-27 NOTE — Telephone Encounter (Signed)
pt called states that she feels that the sonata 5mg  needs to be increase she now 250lbs and she gets up an wonders the house.

## 2020-03-02 ENCOUNTER — Telehealth: Payer: Self-pay

## 2020-03-02 NOTE — Telephone Encounter (Signed)
Pt states she needs pre procedure instructions about her blood thinners before her scheduled procedure on 03/10/2020

## 2020-03-02 NOTE — Telephone Encounter (Signed)
Called and talked with patient. She was questioning when to stop plaquenil for 03/10/20 appt. Will not be off but 7-8 days instead of 11. OK to proceed per Dr Laban Emperor  .

## 2020-03-10 ENCOUNTER — Ambulatory Visit: Payer: Medicare Other | Admitting: Pain Medicine

## 2020-03-10 DIAGNOSIS — M5137 Other intervertebral disc degeneration, lumbosacral region: Secondary | ICD-10-CM | POA: Insufficient documentation

## 2020-03-10 NOTE — Progress Notes (Deleted)
PROVIDER NOTE: Information contained herein reflects review and annotations entered in association with encounter. Interpretation of such information and data should be left to medically-trained personnel. Information provided to patient can be located elsewhere in the medical record under "Patient Instructions". Document created using STT-dictation technology, any transcriptional errors that may result from process are unintentional.    Patient: Laura [name]  Service Category: Procedure  Provider: Oswaldo Done, MD  DOB: 1945/11/18  DOS: 03/10/2020  Location: [facility]  MRN: [id number]  Setting: Ambulatory - outpatient  Referring Provider: Lynnea Ferrier, MD  Type: Established Patient  Specialty: Interventional Pain Management  PCP: Lynnea Ferrier, MD   Primary Reason for Visit: Interventional Pain Management Treatment. CC: No chief complaint on file.  Procedure:          Anesthesia, Analgesia, Anxiolysis:  Type: Therapeutic Inter-Laminar Epidural Steroid Injection  #2  Region: Lumbar Level: L2-3 Level. Laterality: Midline Paramedial  Type: Moderate (Conscious) Sedation combined with Local Anesthesia Indication(s): Analgesia and Anxiety Route: Intravenous (IV) IV Access: Secured Sedation: Meaningful verbal contact was maintained at all times during the procedure  Local Anesthetic: Lidocaine 1-2%  Position: Prone with head of the table was raised to facilitate breathing.   Indications: 1. 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 Lumbar spondylosis   4. DDD (degenerative disc disease), lumbosacral   5. Lumbosacral radiculopathy at L5 (Left)   6. Chronic lower extremity pain (Left)   7. 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 Abnormal MRI, lumbar spine (12/26/2019)   9. Body mass index (BMI) of 40.0-44.9 in  adult (HCC)   10. Chronic anticoagulation (Plaquenil)    Pain Score: Pre-procedure:  /10 Post-procedure:  /10   Pre-op H&P Assessment:  Laura [name] is a 75 y.o. (year old), female patient, seen today for interventional treatment. She  has a past surgical history that includes Abdominal hysterectomy; Cesarean section; Replacement total knee (Left); and Hip surgery (Right). Laura [name] has a current medication list which includes the following prescription(s): zaleplon, albuterol, aspirin ec, azelastine, chlorthalidone, vitamin d, duloxetine, folic acid, hydroxychloroquine, hydroxyzine, infliximab, levothyroxine, levothyroxine, loratadine, methotrexate, metoprolol succinate, multi-vitamins, oxycodone, oxycodone, rosuvastatin, spironolactone, and valacyclovir. Her primarily concern today is the No chief complaint on file.  Initial Vital Signs:  Pulse/HCG Rate:    Temp:   Resp:   BP:   SpO2:    BMI: Estimated body mass index is 44.93 kg/m as calculated from the following:   Height as of 02/19/20:  (1.651 m).   Weight as of 02/19/20: 270 lb (122.5 kg).  Risk Assessment: Allergies: Reviewed. She is allergic to bupropion and penicillins.  Allergy Precautions: None required Coagulopathies: Reviewed. None identified.  Blood-thinner therapy: None at this time Active Infection(s): Reviewed. None identified. Laura [name] is afebrile  Site Confirmation: Laura [name] was asked to confirm the procedure and laterality before marking the site Procedure checklist: Completed Consent: Before the procedure and under the influence of no sedative(s), amnesic(s), or anxiolytics, the patient was informed of the treatment options, risks and possible complications. To fulfill our ethical and legal obligations, as recommended by the American Medical Association's Code of Ethics, I have informed the patient of my clinical impression; the nature and purpose of the treatment or procedure; the risks, benefits, and  possible complications of the intervention; the  alternatives, including doing nothing; the risk(s) and benefit(s) of the alternative treatment(s) or procedure(s); and the risk(s) and benefit(s) of doing nothing. The patient was provided information about the general risks and possible complications associated with the procedure. These may include, but are not limited to: failure to achieve desired goals, infection, bleeding, organ or nerve damage, allergic reactions, paralysis, and death. In addition, the patient was informed of those risks and complications associated to Spine-related procedures, such as failure to decrease pain; infection (i.e.: Meningitis, epidural or intraspinal abscess); bleeding (i.e.: epidural hematoma, subarachnoid hemorrhage, or any other type of intraspinal or peri-dural bleeding); organ or nerve damage (i.e.: Any type of peripheral nerve, nerve root, or spinal cord injury) with subsequent damage to sensory, motor, and/or autonomic systems, resulting in permanent pain, numbness, and/or weakness of one or several areas of the body; allergic reactions; (i.e.: anaphylactic reaction); and/or death. Furthermore, the patient was informed of those risks and complications associated with the medications. These include, but are not limited to: allergic reactions (i.e.: anaphylactic or anaphylactoid reaction(s)); adrenal axis suppression; blood sugar elevation that in diabetics may result in ketoacidosis or comma; water retention that in patients with history of congestive heart failure may result in shortness of breath, pulmonary edema, and decompensation with resultant heart failure; weight gain; swelling or edema; medication-induced neural toxicity; particulate matter embolism and blood vessel occlusion with resultant organ, and/or nervous system infarction; and/or aseptic necrosis of one or more joints. Finally, the patient was informed that Medicine is not an exact science; therefore, there  is also the possibility of unforeseen or unpredictable risks and/or possible complications that may result in a catastrophic outcome. The patient indicated having understood very clearly. We have given the patient no guarantees and we have made no promises. Enough time was given to the patient to ask questions, all of which were answered to the patient's satisfaction. Ms. Puerto has indicated that she wanted to continue with the procedure. Attestation: I, the ordering provider, attest that I have discussed with the patient the benefits, risks, side-effects, alternatives, likelihood of achieving goals, and potential problems during recovery for the procedure that I have provided informed consent. Date  Time: {CHL [facility] TIME CHOICES:[id number]}  Pre-Procedure Preparation:  Monitoring: As per clinic protocol. Respiration, ETCO2, SpO2, BP, heart rate and rhythm monitor placed and checked for adequate function Safety Precautions: Patient was assessed for positional comfort and pressure points before starting the procedure. Time-out: I initiated and conducted the "Time-out" before starting the procedure, as per protocol. The patient was asked to participate by confirming the accuracy of the "Time Out" information. Verification of the correct person, site, and procedure were performed and confirmed by me, the nursing staff, and the patient. "Time-out" conducted as per Joint Commission's Universal Protocol (UP.01.01.01). Time:    Description of Procedure:          Target Area: The interlaminar space, initially targeting the lower laminar border of the superior vertebral body. Approach: Paramedial approach. Area Prepped: Entire Posterior Lumbar Region DuraPrep (Iodine Povacrylex [0.7% available iodine] and Isopropyl Alcohol, 74% w/w) Safety Precautions: Aspiration looking for blood return was conducted prior to all injections. At no point did we inject any substances, as a needle was being advanced. No  attempts were made at seeking any paresthesias. Safe injection practices and needle disposal techniques used. Medications properly checked for expiration dates. SDV (single dose vial) medications used. Description of the Procedure: Protocol guidelines were followed. The procedure needle was introduced through the skin, ipsilateral  to the reported pain, and advanced to the target area. Bone was contacted and the needle walked caudad, until the lamina was cleared. The epidural space was identified using "loss-of-resistance technique" with 2-3 ml of PF-NaCl (0.9% NSS), in a 5cc LOR glass syringe.  There were no vitals filed for this visit.  Start Time:   hrs. End Time:   hrs.  Materials:  Needle(s) Type: Epidural needle Gauge: 20G Length: 15cm Medication(s): Please see orders for medications and dosing details.  Imaging Guidance (Spinal):          Type of Imaging Technique: Fluoroscopy Guidance (Spinal) Indication(s): Assistance in needle guidance and placement for procedures requiring needle placement in or near specific anatomical locations not easily accessible without such assistance. Exposure Time: Please see nurses notes. Contrast: Before injecting any contrast, we confirmed that the patient did not have an allergy to iodine, shellfish, or radiological contrast. Once satisfactory needle placement was completed at the desired level, radiological contrast was injected. Contrast injected under live fluoroscopy. No contrast complications. See chart for type and volume of contrast used. Fluoroscopic Guidance: I was personally present during the use of fluoroscopy. "Tunnel Vision Technique" used to obtain the best possible view of the target area. Parallax error corrected before commencing the procedure. "Direction-depth-direction" technique used to introduce the needle under continuous pulsed fluoroscopy. Once target was reached, antero-posterior, oblique, and lateral fluoroscopic projection used  confirm needle placement in all planes. Images permanently stored in EMR. Interpretation: I personally interpreted the imaging intraoperatively. Adequate needle placement confirmed in multiple planes. Appropriate spread of contrast into desired area was observed. No evidence of afferent or efferent intravascular uptake. No intrathecal or subarachnoid spread observed. Permanent images saved into the patient's record.  Antibiotic Prophylaxis:   Anti-infectives (From admission, onward)   None     Indication(s): None identified  Post-operative Assessment:  Post-procedure Vital Signs:  Pulse/HCG Rate:    Temp:   Resp:   BP:   SpO2:    EBL: None  Complications: No immediate post-treatment complications observed by team, or reported by patient.  Note: The patient tolerated the entire procedure well. A repeat set of vitals were taken after the procedure and the patient was kept under observation following institutional policy, for this type of procedure. Post-procedural neurological assessment was performed, showing return to baseline, prior to discharge. The patient was provided with post-procedure discharge instructions, including a section on how to identify potential problems. Should any problems arise concerning this procedure, the patient was given instructions to immediately contact us, at any time, without hesitation. In any case, we plan to contact the patient by telephone for a follow-up status report regarding this interventional procedure.  Comments:  No additional relevant information.  Plan of Care  Orders:  No orders of the defined types were placed in this encounter.  Chronic Opioid Analgesic:  Oxycodone IR 5 mg every 6 hours (20 mg/day) MME/day:30 mg/day.   Medications ordered for procedure: No orders of the defined types were placed in this encounter.  Medications administered: Laura Manis Lascala had no medications administered during this visit.  See the medical  record for exact dosing, route, and time of administration.  Follow-up plan:   No follow-ups on file.       Considering:   Therapeutic right IA Hyalgan knee injections  Diagnostic left hip IA joint injection  Possible left hip RFA  Diagnostic right IA knee joint injection (w/ steroid)  Diagnostic bilateral genicular NB  Possible bilateral genicular nerve RFA  Diagnostic bilateral lumbar facet block  Possible bilateral lumbar facet RFA  Diagnostic bilateral L3 and/or L5 TFESI  Diagnostic L3-4 vs L4-5 LESI    Palliative PRN treatment(s):   Therapeutic right IA Hyalgan knee injections Palliative left IA hip joint injection  Diagnostic left lumbar facet block #2       Recent Visits Date Type Provider Dept  02/19/20 Office Visit Delano Metz, MD [facility]  02/04/20 Procedure visit Delano Metz, MD [facility]  01/20/20 Office Visit Delano Metz, MD [facility]  12/31/19 Telemedicine [name], MD [facility]  Showing recent visits within past 90 days and meeting all other requirements Today's Visits Date Type Provider Dept  03/10/20 Appointment Delano Metz, MD [facility]  Showing today's visits and meeting all other requirements Future Appointments No visits were found meeting these conditions. Showing future appointments within next 90 days and meeting all other requirements  Disposition: Discharge home  Discharge (Date  Time): 03/10/2020;   hrs.   Primary Care Physician: Lynnea Ferrier, MD Location: [facility] Note by: Oswaldo Done, MD Date: 03/10/2020; Time: 6:27 AM  Disclaimer:  Medicine is not an Visual merchandiser. The only guarantee in medicine is that nothing is guaranteed. It is important to note that the decision to proceed with this intervention was based on the information collected from the patient. The Data and conclusions were drawn from the  patient's questionnaire, the interview, and the physical examination. Because the information was provided in large part by the patient, it cannot be guaranteed that it has not been purposely or unconsciously manipulated. Every effort has been made to obtain as much relevant data as possible for this evaluation. It is important to note that the conclusions that lead to this procedure are derived in large part from the available data. Always take into account that the treatment will also be dependent on availability of resources and existing treatment guidelines, considered by other Pain Management Practitioners as being common knowledge and practice, at the time of the intervention. For Medico-Legal purposes, it is also important to point out that variation in procedural techniques and pharmacological choices are the acceptable norm. The indications, contraindications, technique, and results of the above procedure should only be interpreted and judged by a Board-Certified Interventional Pain Specialist with extensive familiarity and expertise in the same exact procedure and technique.

## 2020-03-20 DIAGNOSIS — G720 Drug-induced myopathy: Secondary | ICD-10-CM

## 2020-03-20 DIAGNOSIS — T466X5A Adverse effect of antihyperlipidemic and antiarteriosclerotic drugs, initial encounter: Secondary | ICD-10-CM | POA: Insufficient documentation

## 2020-03-20 HISTORY — DX: Drug-induced myopathy: G72.0

## 2020-03-24 ENCOUNTER — Other Ambulatory Visit: Payer: Self-pay

## 2020-03-24 ENCOUNTER — Encounter: Payer: Self-pay | Admitting: Psychiatry

## 2020-03-24 ENCOUNTER — Telehealth (INDEPENDENT_AMBULATORY_CARE_PROVIDER_SITE_OTHER): Payer: Medicare Other | Admitting: Psychiatry

## 2020-03-24 DIAGNOSIS — F3342 Major depressive disorder, recurrent, in full remission: Secondary | ICD-10-CM

## 2020-03-24 DIAGNOSIS — F5101 Primary insomnia: Secondary | ICD-10-CM | POA: Diagnosis not present

## 2020-03-24 MED ORDER — ZALEPLON 10 MG PO CAPS
10.0000 mg | ORAL_CAPSULE | Freq: Every evening | ORAL | 1 refills | Status: DC | PRN
Start: 2020-03-24 — End: 2020-05-25

## 2020-03-24 NOTE — Progress Notes (Signed)
Virtual Visit via Telephone Note  I connected with Laura [name] on 03/24/20 at  2:20 PM EDT by telephone and verified that I am speaking with the correct person using two identifiers.  Location Provider Location : [facility] Patient Location : Home  Participants: Patient , Provider   I discussed the limitations, risks, security and privacy concerns of performing an evaluation and management service by telephone and the availability of in person appointments. I also discussed with the patient that there may be a patient responsible charge related to this service. The patient expressed understanding and agreed to proceed.   I discussed the assessment and treatment plan with the patient. The patient was provided an opportunity to ask questions and all were answered. The patient agreed with the plan and demonstrated an understanding of the instructions.   The patient was advised to call back or seek an in-person evaluation if the symptoms worsen or if the condition fails to improve as anticipated.   [facility] MD OP Progress Note  03/24/2020 2:43 PM Laura [name]  MRN:  [id number]  Chief Complaint:  Chief Complaint    Follow-up; Insomnia     HPI: Laura [name] is a 75 year old Caucasian female who has a history of MDD, primary insomnia, chronic pain was evaluated by telemedicine today.  Patient today reports she is currently sleeping 3-4 nights a week on the higher dosage of Sonata.  She denies side effects.  She wants to give the Sonata more time.  She denies any anxiety or depressive symptoms at this time.  She continues to struggle with pain especially knee pain.  She reports her pain is at a 5 out of 10 today.  She also has numbness of her left foot and currently wear braces which helps her to walk.  She continues to follow-up with rheumatologist and pain provider.  Patient denies any suicidality, homicidality or perceptual disturbances.  Patient denies any other concerns  today.  Visit Diagnosis:    ICD-10-CM   1. MDD (major depressive disorder), recurrent, in full remission (HCC)  F33.42   2. Primary insomnia  F51.01 zaleplon (SONATA) 10 MG capsule    Past Psychiatric History: I have reviewed past psychiatric history from my progress note on 06/13/2018  Past Medical History:  Past Medical History:  Diagnosis Date  . Allergy   . Anxiety   . Arthritis, degenerative 10/08/2013   Overview:    a.  Lumbar spine/spinal stenosis/foot drop.   b.  Hands.   . Asthma   . Chronic hand pain, left 10/10/2019  . Chronic kidney disease   . Depression   . 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 Major depression, single episode, in complete remission (HCC) 06/25/2015  . Memory loss, short term 03/19/2014  . Sciatica of left side 12/31/2019  . Seizure (HCC) 10/07/2014  . Sleep apnea   . Sleep apnea   . Thyroid disease     Past Surgical History:  Procedure Laterality Date  . ABDOMINAL HYSTERECTOMY    . CESAREAN SECTION    . HIP SURGERY Right   . REPLACEMENT TOTAL KNEE Left     Family Psychiatric History: Reviewed family psychiatric history from my progress note on 06/13/2018  Family History:  Family History  Problem Relation Age of Onset  . Hypertension Mother   . Stroke Mother   . Heart attack Father   . Alcohol abuse Father   . Depression Father   . Post-traumatic stress disorder Father   . Rheum  arthritis Sister   . Hypertension Sister   . Depression Sister   . Breast cancer Neg Hx     Social History: Reviewed social history from my progress note on 06/13/2018 Social History   Socioeconomic History  . Marital status: Married    Spouse name: Not on file  . Number of children: Not on file  . Years of education: Not on file  . Highest education level: Not on file  Occupational History    Comment: retired  Tobacco Use  . Smoking status: Never Smoker  . Smokeless tobacco: Never Used  Vaping Use  . Vaping Use: Never used   Substance and Sexual Activity  . Alcohol use: No    Alcohol/week: 0.0 standard drinks  . Drug use: No  . Sexual activity: Not Currently  Other Topics Concern  . Not on file  Social History Narrative  . Not on file   Social Determinants of Health   Financial Resource Strain: Not on file  Food Insecurity: Not on file  Transportation Needs: Not on file  Physical Activity: Not on file  Stress: Not on file  Social Connections: Not on file    Allergies:  Allergies  Allergen Reactions  . Bupropion   . Penicillins Rash    Metabolic Disorder Labs: No results found for: HGBA1C, MPG No results found for: PROLACTIN No results found for: CHOL, TRIG, HDL, CHOLHDL, VLDL, LDLCALC Lab Results  Component Value Date   TSH 0.383 10/07/2018    Therapeutic Level Labs: No results found for: LITHIUM No results found for: VALPROATE No components found for:  CBMZ  Current Medications: Current Outpatient Medications  Medication Sig Dispense Refill  . empagliflozin (JARDIANCE) 10 MG TABS tablet Take 1 tablet by mouth daily.    Marland Kitchen albuterol (VENTOLIN HFA) 108 (90 Base) MCG/ACT inhaler Inhale 2 puffs into the lungs every 6 (six) hours as needed for wheezing or shortness of breath. 18 g 3  . aspirin EC 81 MG tablet Take by mouth.    Marland Kitchen azelastine (ASTELIN) 0.1 % nasal spray Place into the nose.    . chlorthalidone (HYGROTON) 25 MG tablet TAKE 1/2 TABLET (12.5MG ) BY MOUTH EVERY DAY  11  . Cholecalciferol (VITAMIN D) 2000 UNITS tablet Take by mouth daily.     . DULoxetine (CYMBALTA) 60 MG capsule Take 1 capsule (60 mg total) by mouth daily. 90 capsule 1  . folic acid (FOLVITE) 1 MG tablet Take 1 mg by mouth daily.    . hydroxychloroquine (PLAQUENIL) 200 MG tablet Take 100 mg by mouth daily.     . hydrOXYzine (VISTARIL) 25 MG capsule Take 1-2 capsules (25-50 mg total) by mouth at bedtime as needed. For sleep 180 capsule 1  . inFLIXimab (REMICADE) 100 MG injection Inject 100 mg into the vein every  8 (eight) weeks.    Marland Kitchen levothyroxine (SYNTHROID) 50 MCG tablet Take 50 mcg by mouth at bedtime.     Marland Kitchen levothyroxine (SYNTHROID, LEVOTHROID) 200 MCG tablet TAKE 1 TABLET ONCE DAILY- ON AN EMPTY STOMACH WITH A GLASS OF WATER 30-60 MINUTES BEFORE BREAKFAST  5  . loratadine (CLARITIN) 10 MG tablet Take 10 mg by mouth daily.     . methotrexate (RHEUMATREX) 2.5 MG tablet TAKE 4 TABLETS (10 MG TOTAL) BY MOUTH EVERY 7 (SEVEN) DAYS WITH A MEAL  5  . metoprolol succinate (TOPROL-XL) 100 MG 24 hr tablet Take 1 tablet (100 mg total) by mouth daily. Take with or immediately following a meal. 30 tablet  0  . Multiple Vitamin (MULTI-VITAMINS) TABS Take by mouth.    . oxyCODONE (OXY IR/ROXICODONE) 5 MG immediate release tablet Take 1 tablet (5 mg total) by mouth every 6 (six) hours as needed for severe pain. 120 tablet 0  . oxyCODONE (OXY IR/ROXICODONE) 5 MG immediate release tablet Take 1 tablet (5 mg total) by mouth every 6 (six) hours as needed for severe pain. Must last 30 days 120 tablet 0  . rosuvastatin (CRESTOR) 5 MG tablet Take 5 mg by mouth 2 (two) times a week.    . spironolactone (ALDACTONE) 25 MG tablet TAKE 1/2 (ONE-HALF) TABLET BY MOUTH DAILY  11  . valACYclovir (VALTREX) 1000 MG tablet PLEASE SEE ATTACHED FOR DETAILED DIRECTIONS    . zaleplon (SONATA) 10 MG capsule Take 1 capsule (10 mg total) by mouth at bedtime as needed for sleep. 30 capsule 1   No current facility-administered medications for this visit.     Musculoskeletal: Strength & Muscle Tone: UTA Gait & Station: UTA Patient leans: N/A  Psychiatric Specialty Exam: Review of Systems  Musculoskeletal:       Knee BL - pain  Neurological: Positive for numbness (left foot).  Psychiatric/Behavioral: Positive for sleep disturbance.  All other systems reviewed and are negative.   There were no vitals taken for this visit.There is no height or weight on file to calculate BMI.  General Appearance: UTA  Eye Contact:  UTA  Speech:  Clear  and Coherent  Volume:  Normal  Mood:  Euthymic  Affect:  UTA  Thought Process:  Goal Directed and Descriptions of Associations: Intact  Orientation:  Full (Time, Place, and Person)  Thought Content: Logical   Suicidal Thoughts:  No  Homicidal Thoughts:  No  Memory:  Immediate;   Fair Recent;   Fair Remote;   Fair  Judgement:  Fair  Insight:  Fair  Psychomotor Activity:  UTA  Concentration:  Concentration: Fair and Attention Span: Fair  Recall:  [employer] of Knowledge: Fair  Language: Fair  Akathisia:  No  Handed:  Right  AIMS (if indicated): UTA  Assets:  Communication Skills Desire for Improvement Housing Social Support  ADL's:  Intact  Cognition: WNL  Sleep:  Improving   Screenings: PHQ2-9   Flowsheet Row Video Visit from 03/24/2020 in [facility] Video Visit from 02/19/2020 in [facility] Procedure visit from 02/04/2020 in [facility] Office Visit from 03[facility]14/2018 in [facility] Office Visit from 06/19/2017 in [facility]  PHQ-2 Total Score 0 0 0 0 0    Flowsheet Row Video Visit from 03/24/2020 in [facility] Video Visit from 02/19/2020 in [facility]  C-SSRS RISK CATEGORY No Risk No Risk       Assessment and Plan: Laura [name] is a 75 year old Caucasian female, married, retired, lives in [place], has a history of depression, currently in remission, primary insomnia, congestive heart failure, GERD, diabetes melitis, hypothyroidism, rheumatoid arthritis, chronic pain, recent diagnosis of foot drop was evaluated by telemedicine today.  Patient is currently making progress.  Plan as noted below.  Plan MDD in remission Cymbalta 60 mg p.o. daily-reduced dosage  Primary insomnia-improving Sonata 10 mg p.o. nightly She will also benefit from sufficient  pain management.  Follow-up in clinic in person on April 19 at 1130.   I have spent at least 19 minutes non face to face with patient today which  includes the time spent for preparing to see the patient  (e.g., review of test, records), ordering medications and test, psychoeducation and supportive psychotherapy, care coordination as well as documenting clinical information in electronic health record.  This note was generated in part or whole with voice recognition software. Voice recognition is usually quite accurate but there are transcription errors that can and very often do occur. I apologize for any typographical errors that were not detected and corrected.         Jomarie Longs, MD 03/25/2020, 10:33 AM

## 2020-03-25 ENCOUNTER — Telehealth: Payer: Self-pay

## 2020-03-25 NOTE — Telephone Encounter (Signed)
pt called states that the doctor put her on hydroxychloroquine (PLAQUENIL) 200 MG tablet

## 2020-03-30 NOTE — Telephone Encounter (Signed)
Thank you for letting me know

## 2020-03-30 NOTE — Telephone Encounter (Signed)
Just a FYI  pt called states that she seen the ortho surgeon and he said he didnt think the issues was coming from her back so he sending her to do a EEG

## 2020-04-09 ENCOUNTER — Other Ambulatory Visit: Payer: Self-pay | Admitting: Pain Medicine

## 2020-04-09 ENCOUNTER — Telehealth: Payer: Self-pay | Admitting: Pain Medicine

## 2020-04-09 DIAGNOSIS — G894 Chronic pain syndrome: Secondary | ICD-10-CM

## 2020-04-09 MED ORDER — OXYCODONE HCL 5 MG PO TABS: 5.0000 mg | ORAL_TABLET | Freq: Four times a day (QID) | ORAL | 0 refills | Status: DC | PRN

## 2020-04-09 NOTE — Telephone Encounter (Signed)
After Speaking to Dr. Laban Emperor he has agreed to send in enough medication to last until her appointment on 04/14/2020. Pateint called and bubble sent to [facility].

## 2020-04-09 NOTE — Progress Notes (Signed)
The patient was scheduled to return on 03/10/2020 for medication management appointment.  Previously she had been given a prescription by Dr. Pernell Dupre which she had filled on 03/09/2020 and should have lasted until 04/08/2020.  She calls the clinic today indicating that she has run out of medication and she is not scheduled to come in until next Tuesday when she supposed to come in for a lumbar epidural steroid injection.  Today I will call in a medication for her to last until Tuesday.

## 2020-04-09 NOTE — Telephone Encounter (Signed)
Dr. Pernell Dupre last prescribed meds for this patient and a F2F with Dr. Laban Emperor was not scheduled. It was missed. Please find out if I can add her on for a VV today, she has appt Tues. For procedure. Please let me know soon as you can . She is out today

## 2020-04-13 ENCOUNTER — Other Ambulatory Visit: Payer: Self-pay | Admitting: Psychiatry

## 2020-04-13 DIAGNOSIS — F5101 Primary insomnia: Secondary | ICD-10-CM

## 2020-04-14 ENCOUNTER — Ambulatory Visit (HOSPITAL_BASED_OUTPATIENT_CLINIC_OR_DEPARTMENT_OTHER): Payer: Medicare Other | Admitting: Pain Medicine

## 2020-04-14 ENCOUNTER — Encounter: Payer: Self-pay | Admitting: Pain Medicine

## 2020-04-14 ENCOUNTER — Ambulatory Visit
Admission: RE | Admit: 2020-04-14 | Discharge: 2020-04-14 | Disposition: A | Discharge status: Home / Self Care | Payer: Medicare Other | Source: Ambulatory Visit | Attending: Pain Medicine | Admitting: Pain Medicine

## 2020-04-14 ENCOUNTER — Other Ambulatory Visit: Payer: Self-pay

## 2020-04-14 VITALS — BP 168/78 | HR 74 | Temp 97.1°F | Resp 20 | Ht 66.0 in | Wt 270.0 lb

## 2020-04-14 DIAGNOSIS — M5137 Other intervertebral disc degeneration, lumbosacral region: Secondary | ICD-10-CM | POA: Diagnosis present

## 2020-04-14 DIAGNOSIS — Z7901 Long term (current) use of anticoagulants: Secondary | ICD-10-CM | POA: Diagnosis present

## 2020-04-14 DIAGNOSIS — G8929 Other chronic pain: Secondary | ICD-10-CM

## 2020-04-14 DIAGNOSIS — M47816 Spondylosis without myelopathy or radiculopathy, lumbar region: Secondary | ICD-10-CM | POA: Insufficient documentation

## 2020-04-14 DIAGNOSIS — M48061 Spinal stenosis, lumbar region without neurogenic claudication: Secondary | ICD-10-CM | POA: Insufficient documentation

## 2020-04-14 DIAGNOSIS — Z79891 Long term (current) use of opiate analgesic: Secondary | ICD-10-CM | POA: Insufficient documentation

## 2020-04-14 DIAGNOSIS — G894 Chronic pain syndrome: Secondary | ICD-10-CM

## 2020-04-14 DIAGNOSIS — R937 Abnormal findings on diagnostic imaging of other parts of musculoskeletal system: Secondary | ICD-10-CM

## 2020-04-14 DIAGNOSIS — Z79899 Other long term (current) drug therapy: Secondary | ICD-10-CM | POA: Insufficient documentation

## 2020-04-14 DIAGNOSIS — M5441 Lumbago with sciatica, right side: Secondary | ICD-10-CM | POA: Diagnosis present

## 2020-04-14 DIAGNOSIS — M48062 Spinal stenosis, lumbar region with neurogenic claudication: Secondary | ICD-10-CM | POA: Diagnosis present

## 2020-04-14 DIAGNOSIS — Z6841 Body Mass Index (BMI) 40.0 and over, adult: Secondary | ICD-10-CM | POA: Insufficient documentation

## 2020-04-14 DIAGNOSIS — M51379 Other intervertebral disc degeneration, lumbosacral region without mention of lumbar back pain or lower extremity pain: Secondary | ICD-10-CM

## 2020-04-14 DIAGNOSIS — M5442 Lumbago with sciatica, left side: Secondary | ICD-10-CM | POA: Diagnosis present

## 2020-04-14 MED ORDER — SODIUM CHLORIDE 0.9% FLUSH
2.0000 mL | Freq: Once | INTRAVENOUS | Status: AC
Start: 2020-04-14 — End: 2020-04-14
  Administered 2020-04-14: 2 mL

## 2020-04-14 MED ORDER — ROPIVACAINE HCL 2 MG/ML IJ SOLN
2.0000 mL | Freq: Once | INTRAMUSCULAR | Status: AC
  Administered 2020-04-14: 2 mL via EPIDURAL
  Filled 2020-04-14: qty 10

## 2020-04-14 MED ORDER — TRIAMCINOLONE ACETONIDE 40 MG/ML IJ SUSP
40.0000 mg | Freq: Once | INTRAMUSCULAR | Status: AC
Start: 2020-04-14 — End: 2020-04-14
  Administered 2020-04-14: 40 mg
  Filled 2020-04-14: qty 1

## 2020-04-14 MED ORDER — MIDAZOLAM HCL 5 MG/5ML IJ SOLN: 1.0000 mg | INTRAMUSCULAR | Status: DC | PRN

## 2020-04-14 MED ORDER — OXYCODONE HCL 5 MG PO TABS: 5.0000 mg | ORAL_TABLET | Freq: Four times a day (QID) | ORAL | 0 refills | Status: DC | PRN

## 2020-04-14 MED ORDER — IOHEXOL 180 MG/ML  SOLN
10.0000 mL | Freq: Once | INTRAMUSCULAR | Status: AC
  Administered 2020-04-14: 10 mL via EPIDURAL
  Filled 2020-04-14: qty 20

## 2020-04-14 MED ORDER — SODIUM CHLORIDE (PF) 0.9 % IJ SOLN
INTRAMUSCULAR | Status: AC
  Filled 2020-04-14: qty 10

## 2020-04-14 MED ORDER — FENTANYL CITRATE (PF) 100 MCG/2ML IJ SOLN: 25.0000 ug | INTRAMUSCULAR | Status: DC | PRN

## 2020-04-14 MED ORDER — LIDOCAINE HCL 2 % IJ SOLN
20.0000 mL | Freq: Once | INTRAMUSCULAR | Status: AC
Start: 2020-04-14 — End: 2020-04-14
  Administered 2020-04-14: 400 mg
  Filled 2020-04-14: qty 40

## 2020-04-14 MED ORDER — LACTATED RINGERS IV SOLN: 1000.0000 mL | Freq: Once | INTRAVENOUS | Status: DC

## 2020-04-14 NOTE — Progress Notes (Addendum)
PROVIDER NOTE: Information contained herein reflects review and annotations entered in association with encounter. Interpretation of such information and data should be left to medically-trained personnel. Information provided to patient can be located elsewhere in the medical record under "Patient Instructions". Document created using STT-dictation technology, any transcriptional errors that may result from process are unintentional.    Patient: Laura [name]  Service Category: Procedure  Provider: Oswaldo Done, MD  DOB: 06/16/1945  DOS: 04/14/2020  Location: [facility]  MRN: [id number]  Setting: Ambulatory - outpatient  Referring Provider: Lynnea Ferrier, MD  Type: Established Patient  Specialty: Interventional Pain Management  PCP: Lynnea Ferrier, MD   Primary Reason for Visit: Interventional Pain Management Treatment. CC: Back Pain  Procedure:          Anesthesia, Analgesia, Anxiolysis:  Type: Therapeutic Inter-Laminar Epidural Steroid Injection  #1  Region: Lumbar Level: L2-3 Level. Laterality: Midline Paramedial  Type: Local Anesthesia Indication(s): Analgesia         Route: Infiltration ([place]/IM) IV Access: Declined Sedation: Declined  Local Anesthetic: Lidocaine 1-2%  Position: Prone with head of the table was raised to facilitate breathing.   Indications: 1. Chronic low back pain (1ry area of Pain) (Bilateral) (L>R) w/ sciatica (Bilateral)   2. DDD (degenerative disc disease), lumbosacral   3. 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 Lumbar spondylosis   6. Abnormal MRI, lumbar spine (12/26/2019)   7. Chronic anticoagulation (Plaquenil)   8. Morbid obesity with BMI of 40.0-44.9, adult (HCC)   9. Pharmacologic therapy   10. Chronic use of opiate for therapeutic purpose   11. Chronic pain syndrome    Pain Score: Pre-procedure: 5  /10 Post-procedure: 0-No pain/10   RTCB: 07/13/2020  Pre-op H&P Assessment:  Laura [name] is a 75 y.o. (year old), female patient, seen today for interventional treatment. She  has a past surgical history that includes Abdominal hysterectomy; Cesarean section; Replacement total knee (Left); and Hip surgery (Right). Laura [name] has a current medication list which includes the following prescription(s): albuterol, aspirin ec, azelastine, chlorthalidone, vitamin d, duloxetine, empagliflozin, folic acid, hydroxychloroquine, hydroxyzine, infliximab, levothyroxine, levothyroxine, loratadine, methotrexate, metoprolol succinate, multi-vitamins, rosuvastatin, spironolactone, valacyclovir, zaleplon, oxycodone, [START ON 05/14/2020] oxycodone, and [START ON 06/13/2020] oxycodone. Her primarily concern today is the Back Pain  Initial Vital Signs:  Pulse/HCG Rate: 74ECG Heart Rate: 92 Temp: (!) 97.1 F (36.2 C) Resp: 16 BP: (!) 143/94 SpO2: 98 %  BMI: Estimated body mass index is 43.58 kg/m as calculated from the following:   Height as of this encounter:  (1.676 m).   Weight as of this encounter: 270 lb (122.5 kg).  Risk Assessment: Allergies: Reviewed. She is allergic to bupropion and penicillins.  Allergy Precautions: None required Coagulopathies: Reviewed. None identified.  Blood-thinner therapy: None at this time Active Infection(s): Reviewed. None identified. Laura [name] is afebrile  Site Confirmation: Laura [name] was asked to confirm the procedure and laterality before marking the site Procedure checklist: Completed Consent: Before the procedure and under the influence of no sedative(s), amnesic(s), or anxiolytics, the patient was informed of the treatment options, risks and possible complications. To fulfill our ethical and legal obligations, as recommended by the American Medical Association's Code of Ethics, I have informed the patient of my clinical impression; the nature and purpose of the  treatment or procedure; the risks, benefits, and possible  complications of the intervention; the alternatives, including doing nothing; the risk(s) and benefit(s) of the alternative treatment(s) or procedure(s); and the risk(s) and benefit(s) of doing nothing. The patient was provided information about the general risks and possible complications associated with the procedure. These may include, but are not limited to: failure to achieve desired goals, infection, bleeding, organ or nerve damage, allergic reactions, paralysis, and death. In addition, the patient was informed of those risks and complications associated to Spine-related procedures, such as failure to decrease pain; infection (i.e.: Meningitis, epidural or intraspinal abscess); bleeding (i.e.: epidural hematoma, subarachnoid hemorrhage, or any other type of intraspinal or peri-dural bleeding); organ or nerve damage (i.e.: Any type of peripheral nerve, nerve root, or spinal cord injury) with subsequent damage to sensory, motor, and/or autonomic systems, resulting in permanent pain, numbness, and/or weakness of one or several areas of the body; allergic reactions; (i.e.: anaphylactic reaction); and/or death. Furthermore, the patient was informed of those risks and complications associated with the medications. These include, but are not limited to: allergic reactions (i.e.: anaphylactic or anaphylactoid reaction(s)); adrenal axis suppression; blood sugar elevation that in diabetics may result in ketoacidosis or comma; water retention that in patients with history of congestive heart failure may result in shortness of breath, pulmonary edema, and decompensation with resultant heart failure; weight gain; swelling or edema; medication-induced neural toxicity; particulate matter embolism and blood vessel occlusion with resultant organ, and/or nervous system infarction; and/or aseptic necrosis of one or more joints. Finally, the patient was informed that  Medicine is not an exact science; therefore, there is also the possibility of unforeseen or unpredictable risks and/or possible complications that may result in a catastrophic outcome. The patient indicated having understood very clearly. We have given the patient no guarantees and we have made no promises. Enough time was given to the patient to ask questions, all of which were answered to the patient's satisfaction. Laura [name] has indicated that she wanted to continue with the procedure. Attestation: I, the ordering provider, attest that I have discussed with the patient the benefits, risks, side-effects, alternatives, likelihood of achieving goals, and potential problems during recovery for the procedure that I have provided informed consent. Date  Time: 04/14/2020 11:17 AM  Pre-Procedure Preparation:  Monitoring: As per clinic protocol. Respiration, ETCO2, SpO2, BP, heart rate and rhythm monitor placed and checked for adequate function Safety Precautions: Patient was assessed for positional comfort and pressure points before starting the procedure. Time-out: I initiated and conducted the "Time-out" before starting the procedure, as per protocol. The patient was asked to participate by confirming the accuracy of the "Time Out" information. Verification of the correct person, site, and procedure were performed and confirmed by me, the nursing staff, and the patient. "Time-out" conducted as per Joint Commission's Universal Protocol (UP.01.01.01). Time: 1155  Description of Procedure:          Target Area: The interlaminar space, initially targeting the lower laminar border of the superior vertebral body. Approach: Paramedial approach. Area Prepped: Entire Posterior Lumbar Region DuraPrep (Iodine Povacrylex [0.7% available iodine] and Isopropyl Alcohol, 74% w/w) Safety Precautions: Aspiration looking for blood return was conducted prior to all injections. At no point did we inject any substances, as a  needle was being advanced. No attempts were made at seeking any paresthesias. Safe injection practices and needle disposal techniques used. Medications properly checked for expiration dates. SDV (single dose vial) medications used. Description of the Procedure: Protocol guidelines were followed. The procedure needle was introduced through  the skin, ipsilateral to the reported pain, and advanced to the target area. Bone was contacted and the needle walked caudad, until the lamina was cleared. The epidural space was identified using "loss-of-resistance technique" with 2-3 ml of PF-NaCl (0.9% NSS), in a 5cc LOR glass syringe.  Vitals:   04/14/20 1156 04/14/20 1200 04/14/20 1205 04/14/20 1215  BP: (!) 190/101 (!) 182/110 95/76 (!) 168/78  Pulse:      Resp: Temp:      SpO2: 100% 100% 100%   Weight:      Height:        Start Time: 1155 hrs. End Time: 1203 hrs.  Materials:  Needle(s) Type: Epidural needle Gauge: 17G Length: 3.5-in Medication(s): Please see orders for medications and dosing details.  Imaging Guidance (Spinal):          Type of Imaging Technique: Fluoroscopy Guidance (Spinal) Indication(s): Assistance in needle guidance and placement for procedures requiring needle placement in or near specific anatomical locations not easily accessible without such assistance. Exposure Time: Please see nurses notes. Contrast: Before injecting any contrast, we confirmed that the patient did not have an allergy to iodine, shellfish, or radiological contrast. Once satisfactory needle placement was completed at the desired level, radiological contrast was injected. Contrast injected under live fluoroscopy. No contrast complications. See chart for type and volume of contrast used. Fluoroscopic Guidance: I was personally present during the use of fluoroscopy. "Tunnel Vision Technique" used to obtain the best possible view of the target area. Parallax error corrected before commencing the  procedure. "Direction-depth-direction" technique used to introduce the needle under continuous pulsed fluoroscopy. Once target was reached, antero-posterior, oblique, and lateral fluoroscopic projection used confirm needle placement in all planes. Images permanently stored in EMR. Interpretation: I personally interpreted the imaging intraoperatively. Adequate needle placement confirmed in multiple planes. Appropriate spread of contrast into desired area was observed. No evidence of afferent or efferent intravascular uptake. No intrathecal or subarachnoid spread observed. Permanent images saved into the patient's record.  Antibiotic Prophylaxis:   Anti-infectives (From admission, onward)   None     Indication(s): None identified  Post-operative Assessment:  Post-procedure Vital Signs:  Pulse/HCG Rate: 7488 Temp: (!) 97.1 F (36.2 C) Resp: 20 BP: (!) 168/78 SpO2: 100 %  EBL: None  Complications: No immediate post-treatment complications observed by team, or reported by patient.  Note: The patient tolerated the entire procedure well. A repeat set of vitals were taken after the procedure and the patient was kept under observation following institutional policy, for this type of procedure. Post-procedural neurological assessment was performed, showing return to baseline, prior to discharge. The patient was provided with post-procedure discharge instructions, including a section on how to identify potential problems. Should any problems arise concerning this procedure, the patient was given instructions to immediately contact us, at any time, without hesitation. In any case, we plan to contact the patient by telephone for a follow-up status report regarding this interventional procedure.  Comments:  No additional relevant information.  Plan of Care  Orders:  Orders Placed This Encounter  Procedures  . Lumbar Epidural Injection    Scheduling Instructions:     Procedure: Interlaminar LESI  L2-3     Laterality: Midline     Sedation: Patient's choice     Timeframe:  Today    Order Specific Question:   Where will this procedure be performed?    Answer:   [facility]  . [facility] C-ARM 1-60  MIN NO REPORT    Intraoperative interpretation by procedural physician at [facility].    Standing Status:   Standing    Number of Occurrences:   1    Order Specific Question:   Reason for exam:    Answer:   Assistance in needle guidance and placement for procedures requiring needle placement in or near specific anatomical locations not easily accessible without such assistance.  . Informed Consent Details: Physician/Practitioner Attestation; Transcribe to consent form and obtain patient signature    Note: Always confirm laterality of pain with Ms. Surges, before procedure. Transcribe to consent form and obtain patient signature.    Order Specific Question:   Physician/Practitioner attestation of informed consent for procedure/surgical case    Answer:   I, the physician/practitioner, attest that I have discussed with the patient the benefits, risks, side effects, alternatives, likelihood of achieving goals and potential problems during recovery for the procedure that I have provided informed consent.    Order Specific Question:   Procedure    Answer:   Lumbar epidural steroid injection under fluoroscopic guidance    Order Specific Question:   Physician/Practitioner performing the procedure    Answer:   Rishon Thilges A. 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 Care order/instruction: Please confirm that the patient has stopped the Plaquenil (Hydroxychloroquine) x 11 days prior to procedure or surgery.    Please confirm that the patient has stopped the Plaquenil (Hydroxychloroquine) x 11 days prior to procedure or surgery.    Standing Status:   Standing    Number of  Occurrences:   1  . Provide equipment / supplies at bedside    "Epidural Tray" (Disposable  single use) Catheter: NOT required    Standing Status:   Standing    Number of Occurrences:   1    Order Specific Question:   Specify    Answer:   Epidural Tray  . Bleeding precautions    Standing Status:   Standing    Number of Occurrences:   1   Chronic Opioid Analgesic:  Oxycodone IR 5 mg every 6 hours (20 mg/day) MME/day:30 mg/day.   Medications ordered for procedure: Meds ordered this encounter  Medications  . iohexol (OMNIPAQUE) 180 MG/ML injection 10 mL    Must be Myelogram-compatible. If not available, you may substitute with a water-soluble, non-ionic, hypoallergenic, myelogram-compatible radiological contrast medium.  Marland Kitchen lidocaine (XYLOCAINE) 2 % (with pres) injection 400 mg  . DISCONTD: lactated ringers infusion 1,000 mL  . DISCONTD: midazolam (VERSED) 5 MG/5ML injection 1-2 mg    Make sure Flumazenil is available in the pyxis when using this medication. If oversedation occurs, administer 0.2 mg IV over 15 sec. If after 45 sec no response, administer 0.2 mg again over 1 min; may repeat at 1 min intervals; not to exceed 4 doses (1 mg)  . DISCONTD: fentaNYL (SUBLIMAZE) injection 25-50 mcg    Make sure Narcan is available in the pyxis when using this medication. In the event of respiratory depression (RR< 8/min): Titrate NARCAN (naloxone) in increments of 0.1 to 0.2 mg IV at 2-3 minute intervals, until desired degree of reversal.  . sodium chloride flush (NS) 0.9 % injection 2 mL  . ropivacaine (PF) 2 mg/mL (0.2%) (NAROPIN) injection 2 mL  . triamcinolone acetonide (KENALOG-40) injection 40 mg  . oxyCODONE (OXY IR/ROXICODONE) 5 MG immediate release  tablet    Sig: Take 1 tablet (5 mg total) by mouth every 6 (six) hours as needed for severe pain. Must last 30 days    Dispense:  120 tablet    Refill:  0    Not a duplicate. Do NOT delete! Chronic Pain: STOP Act NOT applicable. Fill 1 day  early if closed on refill date. Avoid benzodiazepines within 8 hours of opioids. Do not send refill requests.  Marland Kitchen oxyCODONE (OXY IR/ROXICODONE) 5 MG immediate release tablet    Sig: Take 1 tablet (5 mg total) by mouth every 6 (six) hours as needed for severe pain. Must last 30 days    Dispense:  120 tablet    Refill:  0    Not a duplicate. Do NOT delete! Chronic Pain: STOP Act NOT applicable. Fill 1 day early if closed on refill date. Avoid benzodiazepines within 8 hours of opioids. Do not send refill requests.  Marland Kitchen oxyCODONE (OXY IR/ROXICODONE) 5 MG immediate release tablet    Sig: Take 1 tablet (5 mg total) by mouth every 6 (six) hours as needed for severe pain. Must last 30 days    Dispense:  120 tablet    Refill:  0    Not a duplicate. Do NOT delete! Chronic Pain: STOP Act NOT applicable. Fill 1 day early if closed on refill date. Avoid benzodiazepines within 8 hours of opioids. Do not send refill requests.   Medications administered: We administered iohexol, lidocaine, sodium chloride flush, ropivacaine (PF) 2 mg/mL (0.2%), and triamcinolone acetonide.  See the medical record for exact dosing, route, and time of administration.  Follow-up plan:   Return in about 2 weeks (around 04/28/2020) for on afternoon of procedure day, (VV), (PPE).       Considering:   Therapeutic right IA Hyalgan knee injections  Diagnostic left hip IA joint injection  Possible left hip RFA  Diagnostic right IA knee joint injection (w/ steroid)  Diagnostic bilateral genicular NB  Possible bilateral genicular nerve RFA  Diagnostic bilateral lumbar facet block  Possible bilateral lumbar facet RFA  Diagnostic bilateral L3 and/or L5 TFESI  Diagnostic L3-4 vs L4-5 LESI    Palliative PRN treatment(s):   Therapeutic right IA Hyalgan knee injections Palliative left IA hip joint injection  Diagnostic left lumbar facet block #2       Recent Visits Date Type Provider Dept  02/19/20 Office Visit Delano Metz,  MD [facility]  02/04/20 Procedure visit Delano Metz, MD [facility]  01/20/20 Office Visit Delano Metz, MD [facility]  Showing recent visits within past 90 days and meeting all other requirements Today's Visits Date Type Provider Dept  04/14/20 Procedure visit Delano Metz, MD [facility]  Showing today's visits and meeting all other requirements Future Appointments Date Type Provider Dept  05/05/20 Appointment Delano Metz, MD [facility]  Showing future appointments within next 90 days and meeting all other requirements  Disposition: Discharge home  Discharge (Date  Time): 04/14/2020; 1216 hrs.   Primary Care Physician: Lynnea Ferrier, MD Location: [facility] Note by: Oswaldo Done, MD Date: 04/14/2020; Time: 12:22 PM  Disclaimer:  Medicine is not an Visual merchandiser. The only guarantee in medicine is that nothing is guaranteed. It is important to note that the decision to proceed with this intervention was based on the information collected from the patient. The Data and conclusions were drawn from the patient's questionnaire, the interview, and the physical examination.  Because the information was provided in large part by the patient, it cannot be guaranteed that it has not been purposely or unconsciously manipulated. Every effort has been made to obtain as much relevant data as possible for this evaluation. It is important to note that the conclusions that lead to this procedure are derived in large part from the available data. Always take into account that the treatment will also be dependent on availability of resources and existing treatment guidelines, considered by other Pain Management Practitioners as being common knowledge and practice, at the time of the intervention. For Medico-Legal purposes, it is also important to point out that variation in procedural techniques  and pharmacological choices are the acceptable norm. The indications, contraindications, technique, and results of the above procedure should only be interpreted and judged by a Board-Certified Interventional Pain Specialist with extensive familiarity and expertise in the same exact procedure and technique.

## 2020-04-14 NOTE — Patient Instructions (Addendum)
____________________________________________________________________________________________  Post-Procedure Discharge Instructions  Instructions:  Apply ice:   Purpose: This will minimize any swelling and discomfort after procedure.   When: Day of procedure, as soon as you get home.  How: Fill a plastic sandwich bag with crushed ice. Cover it with a small towel and apply to injection site.  How long: (15 min on, 15 min off) Apply for 15 minutes then remove x 15 minutes.  Repeat sequence on day of procedure, until you go to bed.  Apply heat:   Purpose: To treat any soreness and discomfort from the procedure.  When: Starting the next day after the procedure.  How: Apply heat to procedure site starting the day following the procedure.  How long: May continue to repeat daily, until discomfort goes away.  Food intake: Start with clear liquids (like water) and advance to regular food, as tolerated.   Physical activities: Keep activities to a minimum for the first 8 hours after the procedure. After that, then as tolerated.  Driving: If you have received any sedation, be responsible and do not drive. You are not allowed to drive for 24 hours after having sedation.  Blood thinner: (Applies only to those taking blood thinners) You may restart your blood thinner 6 hours after your procedure.  Insulin: (Applies only to Diabetic patients taking insulin) As soon as you can eat, you may resume your normal dosing schedule.  Infection prevention: Keep procedure site clean and dry. Shower daily and clean area with soap and water.  Post-procedure Pain Diary: Extremely important that this be done correctly and accurately. Recorded information will be used to determine the next step in treatment. For the purpose of accuracy, follow these rules:  Evaluate only the area treated. Do not report or include pain from an untreated area. For the purpose of this evaluation, ignore all other areas of pain,  except for the treated area.  After your procedure, avoid taking a long nap and attempting to complete the pain diary after you wake up. Instead, set your alarm clock to go off every hour, on the hour, for the initial 8 hours after the procedure. Document the duration of the numbing medicine, and the relief you are getting from it.  Do not go to sleep and attempt to complete it later. It will not be accurate. If you received sedation, it is likely that you were given a medication that may cause amnesia. Because of this, completing the diary at a later time may cause the information to be inaccurate. This information is needed to plan your care.  Follow-up appointment: Keep your post-procedure follow-up evaluation appointment after the procedure (usually 2 weeks for most procedures, 6 weeks for radiofrequencies). DO NOT FORGET to bring you pain diary with you.   Expect: (What should I expect to see with my procedure?)  From numbing medicine (AKA: Local Anesthetics): Numbness or decrease in pain. You may also experience some weakness, which if present, could last for the duration of the local anesthetic.  Onset: Full effect within 15 minutes of injected.  Duration: It will depend on the type of local anesthetic used. On the average, 1 to 8 hours.   From steroids (Applies only if steroids were used): Decrease in swelling or inflammation. Once inflammation is improved, relief of the pain will follow.  Onset of benefits: Depends on the amount of swelling present. The more swelling, the longer it will take for the benefits to be seen. In some cases, up to 10 days.    Duration: Steroids will stay in the system x 2 weeks. Duration of benefits will depend on multiple posibilities including persistent irritating factors.  Side-effects: If present, they may typically last 2 weeks (the duration of the steroids).  Frequent: Cramps (if they occur, drink Gatorade and take over-the-counter Magnesium 450-500 mg  once to twice a day); water retention with temporary weight gain; increases in blood sugar; decreased immune system response; increased appetite.  Occasional: Facial flushing (red, warm cheeks); mood swings; menstrual changes.  Uncommon: Long-term decrease or suppression of natural hormones; bone thinning. (These are more common with higher doses or more frequent use. This is why we prefer that our patients avoid having any injection therapies in other practices.)   Very Rare: Severe mood changes; psychosis; aseptic necrosis.  From procedure: Some discomfort is to be expected once the numbing medicine wears off. This should be minimal if ice and heat are applied as instructed.  Call if: (When should I call?)  You experience numbness and weakness that gets worse with time, as opposed to wearing off.  New onset bowel or bladder incontinence. (Applies only to procedures done in the spine)  Emergency Numbers:  Durning business hours (Monday - Thursday, 8:00 AM - 4:00 PM) (Friday, 9:00 AM - 12:00 Noon): [phone]  After hours: [phone]  NOTE: If you are having a problem and are unable connect with, or to talk to a provider, then go to your nearest urgent care or emergency department. If the problem is serious and urgent, please call 911. ____________________________________________________________________________________________   ____________________________________________________________________________________________  Blood Thinners  IMPORTANT NOTICE:  If you take any of these, make sure to notify the nursing staff.  Failure to do so may result in injury.  Recommended time intervals to stop and restart blood-thinners, before & after invasive procedures  Generic Name Brand Name Stop Time. Must be stopped at least this long before procedures. After procedures, wait at least this long before re-starting.  Abciximab Reopro 15 days 2 hrs  Alteplase Activase 10 days 10 days   Anagrelide Agrylin    Apixaban Eliquis 3 days 6 hrs  Cilostazol Pletal 3 days 5 hrs  Clopidogrel Plavix 7-10 days 2 hrs  Dabigatran Pradaxa 5 days 6 hrs  Dalteparin Fragmin 24 hours 4 hrs  Dipyridamole Aggrenox 11days 2 hrs  Edoxaban Lixiana; Savaysa 3 days 2 hrs  Enoxaparin  Lovenox 24 hours 4 hrs  Eptifibatide Integrillin 8 hours 2 hrs  Fondaparinux  Arixtra 72 hours 12 hrs  Hydroxychloroquine Plaquenil 11 days   Prasugrel Effient 7-10 days 6 hrs  Reteplase Retavase 10 days 10 days  Rivaroxaban Xarelto 3 days 6 hrs  Ticagrelor Brilinta 5-7 days 6 hrs  Ticlopidine Ticlid 10-14 days 2 hrs  Tinzaparin Innohep 24 hours 4 hrs  Tirofiban Aggrastat 8 hours 2 hrs  Warfarin Coumadin 5 days 2 hrs   Other medications with blood-thinning effects  Product indications Generic (Brand) names Note  Cholesterol Lipitor Stop 4 days before procedure  Blood thinner (injectable) Heparin (LMW or LMWH Heparin) Stop 24 hours before procedure  Cancer Ibrutinib (Imbruvica) Stop 7 days before procedure  Malaria/Rheumatoid Hydroxychloroquine (Plaquenil) Stop 11 days before procedure  Thrombolytics  10 days before or after procedures   Over-the-counter (OTC) Products with blood-thinning effects  Product Common names Stop Time  Aspirin > 325 mg Goody Powders, Excedrin, etc. 11 days  Aspirin ? 81 mg  7 days  Fish oil  4 days  Garlic supplements  7 days  Ginkgo   biloba  36 hours  Ginseng  24 hours  NSAIDs Ibuprofen, Naprosyn, etc. 3 days  Vitamin E  4 days   ____________________________________________________________________________________________   

## 2020-04-15 ENCOUNTER — Ambulatory Visit (INDEPENDENT_AMBULATORY_CARE_PROVIDER_SITE_OTHER): Payer: Medicare Other

## 2020-04-15 ENCOUNTER — Telehealth: Payer: Self-pay

## 2020-04-15 DIAGNOSIS — G4733 Obstructive sleep apnea (adult) (pediatric): Secondary | ICD-10-CM

## 2020-04-15 NOTE — Telephone Encounter (Signed)
Post procedure phone call.  LM 

## 2020-04-15 NOTE — Progress Notes (Signed)
95 percentile pressure 11   95th percentile leak 7.18     apnea-hypopnea index  3.8 /hr   total days used  >4 hr 10 days  total days used <4 hr 30 days  Total compliance 30 percent  It has been over 5 years for cpap

## 2020-04-28 ENCOUNTER — Other Ambulatory Visit: Payer: Self-pay

## 2020-04-28 ENCOUNTER — Encounter: Payer: Self-pay | Admitting: Psychiatry

## 2020-04-28 ENCOUNTER — Ambulatory Visit: Payer: Medicare Other | Admitting: Internal Medicine

## 2020-04-28 ENCOUNTER — Ambulatory Visit (INDEPENDENT_AMBULATORY_CARE_PROVIDER_SITE_OTHER): Payer: Medicare Other | Admitting: Psychiatry

## 2020-04-28 VITALS — BP 149/82 | HR 79 | Temp 97.2°F

## 2020-04-28 DIAGNOSIS — F3342 Major depressive disorder, recurrent, in full remission: Secondary | ICD-10-CM

## 2020-04-28 DIAGNOSIS — I639 Cerebral infarction, unspecified: Secondary | ICD-10-CM | POA: Diagnosis not present

## 2020-04-28 DIAGNOSIS — F5101 Primary insomnia: Secondary | ICD-10-CM | POA: Diagnosis not present

## 2020-04-28 NOTE — Progress Notes (Signed)
[facility] MD OP Progress Note  04/28/2020 12:12 PM Laura [name]  MRN:  [id number]  Chief Complaint:  Chief Complaint    Follow-up     HPI: Laura [name] is a 75 year old Caucasian female who has a history of MDD, primary insomnia, chronic pain was evaluated in office today.  Patient today reports she is overall doing well with regards to her mood.  She had a good Easter with her family.  She continues to be compliant on the Cymbalta.  Denies any side effects.  She is sleeping better.  She goes to bed at around 11 PM and sleeps till around 8:30 in the morning.  She reports she takes Restaurant manager, fast food which helps.  Denies side effects.  She does have to go to the restroom couple of times at night however she is able to fall back asleep.  Patient denies any suicidality or homicidality or perceptual disturbances.  She continues to struggle with chronic pain, has a foot drop on her left side.  She does use a brace and has physical therapy 1-2 times a week which is helpful.  She also has housekeeping coming in to help her couple of times a week.  She reports her husband does take care of cooking.  He also drives her places since she stopped driving.  She does report memory problems usually has trouble remembering things from the past.  She agrees to keep track of it and Pharmacist, hospital know if it gets worse.  She currently does not believe it is affecting her daily life much.  Patient denies any other concerns today.  Visit Diagnosis:    ICD-10-CM   1. MDD (major depressive disorder), recurrent, in full remission (HCC)  F33.42   2. Primary insomnia  F51.01     Past Psychiatric History: I have reviewed past psychiatric history from my progress note on 06/13/2018.  Past Medical History:  Past Medical History:  Diagnosis Date  . Allergy   . Anxiety   . Arthritis, degenerative 10/08/2013   Overview:    a.  Lumbar spine/spinal stenosis/foot drop.   b.  Hands.   . Asthma   . Chronic hand pain, left  10/10/2019  . Chronic kidney disease   . Depression   . 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 Major depression, single episode, in complete remission (HCC) 06/25/2015  . Memory loss, short term 03/19/2014  . Sciatica of left side 12/31/2019  . Seizure (HCC) 10/07/2014  . Sleep apnea   . Sleep apnea   . Thyroid disease     Past Surgical History:  Procedure Laterality Date  . ABDOMINAL HYSTERECTOMY    . CESAREAN SECTION    . HIP SURGERY Right   . REPLACEMENT TOTAL KNEE Left     Family Psychiatric History: I have reviewed family psychiatric history from my progress note on 06/13/2018.  Family History:  Family History  Problem Relation Age of Onset  . Hypertension Mother   . Stroke Mother   . Heart attack Father   . Alcohol abuse Father   . Depression Father   . Post-traumatic stress disorder Father   . Rheum arthritis Sister   . Hypertension Sister   . Depression Sister   . Breast cancer Neg Hx     Social History: I have reviewed social history from my progress note on 06/13/2018. Social History   Socioeconomic History  . Marital status: Married    Spouse name: Not on file  . Number of children:  Not on file  . Years of education: Not on file  . Highest education level: Not on file  Occupational History    Comment: retired  Tobacco Use  . Smoking status: Never Smoker  . Smokeless tobacco: Never Used  Vaping Use  . Vaping Use: Never used  Substance and Sexual Activity  . Alcohol use: No    Alcohol/week: 0.0 standard drinks  . Drug use: No  . Sexual activity: Not Currently  Other Topics Concern  . Not on file  Social History Narrative  . Not on file   Social Determinants of Health   Financial Resource Strain: Not on file  Food Insecurity: Not on file  Transportation Needs: Not on file  Physical Activity: Not on file  Stress: Not on file  Social Connections: Not on file    Allergies:  Allergies  Allergen Reactions  . Bupropion    . Penicillins Rash    Metabolic Disorder Labs: No results found for: HGBA1C, MPG No results found for: PROLACTIN No results found for: CHOL, TRIG, HDL, CHOLHDL, VLDL, LDLCALC Lab Results  Component Value Date   TSH 0.383 10/07/2018    Therapeutic Level Labs: No results found for: LITHIUM No results found for: VALPROATE No components found for:  CBMZ  Current Medications: Current Outpatient Medications  Medication Sig Dispense Refill  . albuterol (VENTOLIN HFA) 108 (90 Base) MCG/ACT inhaler Inhale 2 puffs into the lungs every 6 (six) hours as needed for wheezing or shortness of breath. 18 g 3  . aspirin EC 81 MG tablet Take by mouth.    Marland Kitchen azelastine (ASTELIN) 0.1 % nasal spray Place into the nose.    . chlorthalidone (HYGROTON) 25 MG tablet TAKE 1/2 TABLET (12.5MG ) BY MOUTH EVERY DAY  11  . Cholecalciferol (VITAMIN D) 2000 UNITS tablet Take by mouth daily.     . DULoxetine (CYMBALTA) 60 MG capsule Take 1 capsule (60 mg total) by mouth daily. 90 capsule 1  . empagliflozin (JARDIANCE) 10 MG TABS tablet Take 1 tablet by mouth daily.    . folic acid (FOLVITE) 1 MG tablet Take 1 mg by mouth daily.    . hydroxychloroquine (PLAQUENIL) 200 MG tablet Take 100 mg by mouth daily.     . hydrOXYzine (VISTARIL) 25 MG capsule Take 1-2 capsules (25-50 mg total) by mouth at bedtime as needed. For sleep 180 capsule 1  . inFLIXimab (REMICADE) 100 MG injection Inject 100 mg into the vein every 8 (eight) weeks.    Marland Kitchen levothyroxine (SYNTHROID) 50 MCG tablet Take 50 mcg by mouth at bedtime.     Marland Kitchen levothyroxine (SYNTHROID, LEVOTHROID) 200 MCG tablet TAKE 1 TABLET ONCE DAILY- ON AN EMPTY STOMACH WITH A GLASS OF WATER 30-60 MINUTES BEFORE BREAKFAST  5  . loratadine (CLARITIN) 10 MG tablet Take 10 mg by mouth daily.     . methotrexate (RHEUMATREX) 2.5 MG tablet TAKE 4 TABLETS (10 MG TOTAL) BY MOUTH EVERY 7 (SEVEN) DAYS WITH A MEAL  5  . metoprolol succinate (TOPROL-XL) 100 MG 24 hr tablet Take 1 tablet (100  mg total) by mouth daily. Take with or immediately following a meal. 30 tablet 0  . Multiple Vitamin (MULTI-VITAMINS) TABS Take by mouth.    [name] ON 06/13/2020] oxyCODONE (OXY IR/ROXICODONE) 5 MG immediate release tablet Take 1 tablet (5 mg total) by mouth every 6 (six) hours as needed for severe pain. Must last 30 days 120 tablet 0  . rosuvastatin (CRESTOR) 5 MG tablet Take 5  mg by mouth 2 (two) times a week.    . spironolactone (ALDACTONE) 25 MG tablet TAKE 1/2 (ONE-HALF) TABLET BY MOUTH DAILY  11  . valACYclovir (VALTREX) 1000 MG tablet PLEASE SEE ATTACHED FOR DETAILED DIRECTIONS    . zaleplon (SONATA) 10 MG capsule Take 1 capsule (10 mg total) by mouth at bedtime as needed for sleep. 30 capsule 1   No current facility-administered medications for this visit.     Musculoskeletal: Strength & Muscle Tone: UTA Gait & Station: Wheelchair bound Patient leans: N/A  Psychiatric Specialty Exam: Review of Systems  Musculoskeletal: Positive for arthralgias.       Left foot drop  All other systems reviewed and are negative.   Blood pressure (!) 149/82, pulse 79, temperature (!) 97.2 F (36.2 C), temperature source Temporal.There is no height or weight on file to calculate BMI.  General Appearance: Casual  Eye Contact:  Fair  Speech:  Clear and Coherent  Volume:  Normal  Mood:  Euthymic  Affect:  Congruent  Thought Process:  Goal Directed and Descriptions of Associations: Intact  Orientation:  Full (Time, Place, and Person)  Thought Content: Logical   Suicidal Thoughts:  No  Homicidal Thoughts:  No  Memory:  Immediate;   Fair Recent;   Fair Remote;   Limited  Judgement:  Fair  Insight:  Fair  Psychomotor Activity:  Normal  Concentration:  Concentration: Fair and Attention Span: Fair  Recall:  [employer] of Knowledge: Fair  Language: Fair  Akathisia:  No  Handed:  Right  AIMS (if indicated): done  Assets:  Communication Skills Desire for  Improvement Housing Intimacy Resilience Social Support Talents/Skills Transportation Vocational/Educational  ADL's:  Intact  Cognition: WNL  Sleep:  Fair   Screenings: AIMS   Flowsheet Row Office Visit from 04/28/2020 in [facility]  AIMS Total Score 0    PHQ2-9   Flowsheet Row Office Visit from 04/28/2020 in [facility] Video Visit from 03/24/2020 in [facility] Video Visit from 02/19/2020 in [facility] Procedure visit from 02/04/2020 in [facility] Office Visit from 03/14/2018 in [facility]  PHQ-2 Total Score 2 0 0 0 0  PHQ-9 Total Score 5 -- -- -- --    Flowsheet Row Office Visit from 04/28/2020 in [facility] Video Visit from 03/24/2020 in [facility] Video Visit from 02/19/2020 in [facility]  C-SSRS RISK CATEGORY No Risk No Risk No Risk       Assessment and Plan: Zuzu Befort is a 75 year old Caucasian female, married, retired, lives in [place], has a history of depression, multiple medical problems, chronic pain, rheumatoid arthritis was evaluated in office today.  Patient is currently stable.  Plan as noted below.  Plan MDD in remission Cymbalta 60 mg p.o. daily-reduced dosage  Primary insomnia- stable Sonata 10 mg p.o. nightly Melatonin as needed.  Advised patient she could use the melatonin some nights instead of the Sonata. She is aware about drug to drug interaction with her other medications, medication education provided.  Patient to monitor her symptoms of memory problems closely, will reevaluate in future sessions.  Patient to follow-up with primary care provider for elevated blood pressure reading.  Follow-up in clinic again in 3 months or sooner if needed.  This note was  generated in part or whole with voice recognition software. Voice recognition is usually quite accurate  but there are transcription errors that can and very often do occur. I apologize for any typographical errors that were not detected and corrected.       Jomarie Longs, MD 04/29/2020, 8:49 AM

## 2020-05-04 ENCOUNTER — Encounter: Payer: Self-pay | Admitting: Pain Medicine

## 2020-05-05 ENCOUNTER — Ambulatory Visit: Payer: Medicare Other | Attending: Pain Medicine | Admitting: Pain Medicine

## 2020-05-05 ENCOUNTER — Encounter: Payer: Self-pay | Admitting: Pain Medicine

## 2020-05-05 ENCOUNTER — Other Ambulatory Visit: Payer: Self-pay

## 2020-05-05 DIAGNOSIS — M48061 Spinal stenosis, lumbar region without neurogenic claudication: Secondary | ICD-10-CM

## 2020-05-05 DIAGNOSIS — M5441 Lumbago with sciatica, right side: Secondary | ICD-10-CM

## 2020-05-05 DIAGNOSIS — M79605 Pain in left leg: Secondary | ICD-10-CM

## 2020-05-05 DIAGNOSIS — M5137 Other intervertebral disc degeneration, lumbosacral region: Secondary | ICD-10-CM

## 2020-05-05 DIAGNOSIS — M5442 Lumbago with sciatica, left side: Secondary | ICD-10-CM

## 2020-05-05 DIAGNOSIS — G8929 Other chronic pain: Secondary | ICD-10-CM

## 2020-05-05 DIAGNOSIS — M5417 Radiculopathy, lumbosacral region: Secondary | ICD-10-CM

## 2020-05-05 DIAGNOSIS — M25561 Pain in right knee: Secondary | ICD-10-CM

## 2020-05-05 DIAGNOSIS — G894 Chronic pain syndrome: Secondary | ICD-10-CM

## 2020-05-05 DIAGNOSIS — M25552 Pain in left hip: Secondary | ICD-10-CM

## 2020-05-05 DIAGNOSIS — M48062 Spinal stenosis, lumbar region with neurogenic claudication: Secondary | ICD-10-CM | POA: Diagnosis not present

## 2020-05-05 NOTE — Progress Notes (Signed)
Patient: Laura [name]  Service Category: E/M  Provider: Gaspar Cola, MD  DOB: 04-24-1945  DOS: 05/05/2020  Location: Office  MRN: [id number]  Setting: Ambulatory outpatient  Referring Provider: Adin Hector, MD  Type: Established Patient  Specialty: Interventional Pain Management  PCP: Adin Hector, MD  Location: Remote location  Delivery: TeleHealth     Virtual Encounter - Pain Management PROVIDER NOTE: Information contained herein reflects review and annotations entered in association with encounter. Interpretation of such information and data should be left to medically-trained personnel. Information provided to patient can be located elsewhere in the medical record under "Patient Instructions". Document created using STT-dictation technology, any transcriptional errors that may result from process are unintentional.    Contact & Pharmacy Preferred: [phone] Home: [phone] (home) Mobile: [phone] (mobile) E-mail: [email or web address]  [id number] [place] [id number]: [phone]: [phone]  Pre-screening  Laura [name] offered "in-person" vs "virtual" encounter. She indicated preferring virtual for this encounter.   Reason COVID-19*  Social distancing based on CDC and AMA recommendations.   I contacted Laura [name] 05/05/2020 via telephone.      I clearly identified myself as FGaspar Cola MD. I verified that I was speaking with the correct person using two identifiers (Name: EAmira Podolak and date of birth: 8February 11, 1947.  Consent I sought verbal advanced consent from Laura Rankinsfor virtual visit interactions. I informed Laura [name] of possible security and privacy concerns, risks, and limitations associated with providing "not-in-person" medical evaluation and management services. I also informed Laura [name] of the availability of "in-person" appointments.  Finally, I informed her that there would be a charge for the virtual visit and that she could be  personally, fully or partially, financially responsible for it. Ms. ASprouleexpressed understanding and agreed to proceed.   Historic Elements   Ms. EShatana Saxtonis a 75y.o. year old, female patient evaluated today after our last contact on 04/14/2020. Laura [name] has a past medical history of Allergy, Anxiety, Arthritis, degenerative (10/08/2013), Asthma, Chronic hand pain, left (10/10/2019), Chronic kidney disease, Depression, Lumbar spinal stenosis with neurogenic claudication (11/07/2014), Major depression, single episode, [place]), Memory loss, short term (03/19/2014), Sciatica of left side (12/31/2019), [place]), Sleep apnea, Sleep apnea, and Thyroid disease. She also  has a past surgical history that includes Abdominal hysterectomy; Cesarean section; Replacement total knee (Left); and Hip surgery (Right). Ms. AGriebelhas a current medication list which includes the following prescription(s): albuterol, aspirin ec, azelastine, chlorthalidone, vitamin d, duloxetine, empagliflozin, folic acid, hydroxychloroquine, hydroxyzine, infliximab, levothyroxine, levothyroxine, loratadine, methotrexate, metoprolol succinate, multi-vitamins, [START ON 06/13/2020] oxycodone, rosuvastatin, spironolactone, valacyclovir, and zaleplon. She  reports that she has never smoked. She has never used smokeless tobacco. She reports that she does not drink alcohol and does not use drugs. Ms. ASugarmanis allergic to bupropion and penicillins.   HPI  Today, she is being contacted for a post-procedure assessment.  The patient refers having contracted COVID and currently feeling bad secondary to this.  However, she indicates having attained 100% relief of the pain for the duration of the local anesthetic followed by a decrease to a 75% benefit that currently seems to be an ongoing 50 to 60%  improvement of her low back and leg pain.  She is interested in having another injection done as soon [place]  We will set this  up for her.  Post-Procedure Evaluation  Procedure (04/14/2020): Therapeutic midline L2-3 LESI #2 under fluoroscopic guidance, no sedation. Pre-procedure pain level: 5/10 Post-procedure: 0/10 (100% relief)  Sedation: None.  Effectiveness during initial hour after procedure(Ultra-Short Term Relief): 100 %.  Local anesthetic used: Long-acting (4-6 hours) Effectiveness: Defined as any analgesic benefit obtained secondary to the administration of local anesthetics. This carries significant diagnostic value as to the etiological location, or anatomical origin, of the pain. Duration of benefit is expected to coincide with the duration of the local anesthetic used.  Effectiveness during initial 4-6 hours after procedure(Short-Term Relief): 100 %.  Long-term benefit: Defined as any relief past the pharmacologic duration of the local anesthetics.  Effectiveness past the initial 6 hours after procedure(Long-Term Relief): 75 %.  Current benefits: Defined as benefit that persist at this time.   Analgesia:  The patient describes an ongoing 50% relief of her low back and lower extremity pain. Function: Laura [name] reports improvement in function ROM: Laura [name] reports improvement in ROM  Pharmacotherapy Assessment  Analgesic: Oxycodone IR 5 mg every 6 hours (20 mg/day) MME/day:30 mg/day.   Monitoring: [place] PMP: PDMP reviewed during this encounter.       Pharmacotherapy: No side-effects or adverse reactions reported. Compliance: No problems identified. Effectiveness: Clinically acceptable. Plan: Refer to "POC".  UDS:  Summary  Date Value Ref Range Status  07/16/2019 Note  Final    Comment:    ==================================================================== [employer] [facility] 13  (MW) ==================================================================== Test                             Result       Flag       Units  Drug Present and Declared for Prescription Verification   Oxycodone                      1635         EXPECTED   ng/mg creat   Oxymorphone                    521          EXPECTED   ng/mg creat   Noroxycodone                   1941         EXPECTED   ng/mg creat   Noroxymorphone                 174          EXPECTED   ng/mg creat    Sources of oxycodone are scheduled prescription medications.    Oxymorphone, noroxycodone, and noroxymorphone are expected    metabolites of oxycodone. Oxymorphone is also available as a    scheduled prescription medication.  Drug Present not Declared for Prescription Verification   Tramadol                       136          UNEXPECTED ng/mg creat   O-Desmethyltramadol            53           UNEXPECTED ng/mg creat   N-Desmethyltramadol            77           UNEXPECTED ng/mg creat    Source of tramadol is a prescription medication. O-desmethyltramadol  and N-desmethyltramadol are expected metabolites of tramadol.  ==================================================================== Test                      Result    Flag   Units      Ref Range   Creatinine              103              mg/dL      >=20 ==================================================================== Declared Medications:  The flagging and interpretation on this report are based on the  following declared medications.  Unexpected results may arise from  inaccuracies in the declared medications.   **Note: The testing scope of this panel includes these medications:   Oxycodone   **Note: The testing scope of this panel does not include the  following reported medications:   Albuterol  Amlodipine  Aspirin  Azelastine  Chlorthalidone  Duloxetine  Folic Acid  Hydroxychloroquine (Plaquenil)  Infliximab (Remicade)  Levothyroxine   Loratadine (Claritin)  Methotrexate  Metoprolol  Mirtazapine  Multivitamin  Rosuvastatin  Simvastatin (Zocor)  Spironolactone  Valacyclovir (Valtrex) ==================================================================== For clinical consultation, please call [phone]. ====================================================================     Laboratory Chemistry Profile   Renal Lab Results  Component Value Date   BUN 24 (H) 10/03/2019   CREATININE 1.10 (H) 10/03/2019   GFRAA 57 (L) 10/03/2019   GFRNONAA 49 (L) 10/03/2019     Hepatic Lab Results  Component Value Date   AST 23 10/03/2019   ALT 16 10/03/2019   ALBUMIN 4.4 10/03/2019   ALKPHOS 55 10/03/2019     Electrolytes Lab Results  Component Value Date   NA 136 10/03/2019   K 4.2 10/03/2019   CL 100 10/03/2019   CALCIUM 9.0 10/03/2019   MG 2.3 10/08/2018     Bone Lab Results  Component Value Date   25OHVITD1 36 07/15/2015   25OHVITD2 3.6 07/15/2015   25OHVITD3 32 07/15/2015     Inflammation (CRP: Acute Phase) (ESR: Chronic Phase) Lab Results  Component Value Date   CRP 1.9 (H) 07/15/2015   ESRSEDRATE 32 (H) 07/15/2015   LATICACIDVEN 1.4 10/07/2018       Note: Above Lab results reviewed.  Imaging  DG PAIN CLINIC C-ARM 1-60 MIN NO REPORT Fluoro was used, but no Radiologist interpretation will be provided.  Please refer to "NOTES" tab for provider progress note.  Assessment  The primary encounter diagnosis was Chronic low back pain (1ry area of Pain) (Bilateral) (L>R) w/ sciatica (Bilateral). Diagnoses of Chronic lower extremity pain (Left), DDD (degenerative disc disease), lumbosacral, Lumbar spinal stenosis (5 mm Severe L3-4; 8 mm L4-5) w/ neurogenic claudication, Lumbar foraminal stenosis (Bilateral L3-4 and L5-S1), Lumbosacral radiculopathy at L5 (Left), Chronic hip pain (Left), Chronic knee pain (Right), and Chronic pain syndrome were also pertinent to this visit.  Plan of Care   Problem-specific:  No problem-specific Assessment & Plan notes found for this encounter.  Laura [name] has a current medication list which includes the following long-term medication(s): albuterol, azelastine, chlorthalidone, duloxetine, infliximab, levothyroxine, levothyroxine, loratadine, metoprolol succinate, [START ON 06/13/2020] oxycodone, spironolactone, and zaleplon.  Pharmacotherapy (Medications Ordered): No orders of the defined types were placed in this encounter.  Orders:  Orders Placed This Encounter  Procedures  . Lumbar Epidural Injection    Standing Status:   Standing    Number of Occurrences:   2    Standing Expiration Date:   11/04/2020    Scheduling Instructions:  Procedure: Interlaminar Lumbar Epidural Steroid injection (LESI)  L2-3     Laterality: Right-sided     Sedation: Patient's choice.     Timeframe: PRN    Order Specific Question:   Where will this procedure be performed?    Answer:   [facility]   Follow-up plan:   Return for PRN Procedure (no sedation): (R) L2-3 LESI #3.      Considering:   Therapeutic right IA Hyalgan knee injections  Diagnostic left hip IA joint injection  Possible left hip RFA  Diagnostic right IA knee joint injection (w/ steroid)  Diagnostic bilateral genicular NB  Possible bilateral genicular nerve RFA  Diagnostic bilateral lumbar facet block  Possible bilateral lumbar facet RFA  Diagnostic bilateral L3 and/or L5 TFESI  Diagnostic L3-4 vs L4-5 LESI    Palliative PRN treatment(s):   Therapeutic right IA Hyalgan knee injections Palliative left IA hip joint injection  Diagnostic left lumbar facet block #2      Recent Visits Date Type Provider Dept  04/14/20 Procedure visit Milinda Pointer, MD [facility]  02/19/20 Office Visit Milinda Pointer, MD [facility]  Showing recent visits within past 90 days and meeting all other requirements Today's Visits Date Type Provider Dept   05/05/20 Telemedicine Milinda Pointer, MD [facility]  Showing today's visits and meeting all other requirements Future Appointments No visits were found meeting these conditions. Showing future appointments within next 90 days and meeting all other requirements  I discussed the assessment and treatment plan with the patient. The patient was provided an opportunity to ask questions and all were answered. The patient agreed with the plan and demonstrated an understanding of the instructions.  Patient advised to call back or seek an in-person evaluation if the symptoms or condition worsens.  Duration of encounter: 12 minutes.  Note by: Gaspar Cola, MD Date: 05/05/2020; Time: 4:22 PM

## 2020-05-05 NOTE — Patient Instructions (Signed)
____________________________________________________________________________________________  Preparing for Procedure with Sedation  Procedure appointments are limited to planned procedures: . No Prescription Refills. . No disability issues will be discussed. . No medication changes will be discussed.  Instructions: . Oral Intake: Do not eat or drink anything for at least 8 hours prior to your procedure. (Exception: Blood Pressure Medication. See below.) . Transportation: Unless otherwise stated by your physician, you may drive yourself after the procedure. . Blood Pressure Medicine: Do not forget to take your blood pressure medicine with a sip of water the morning of the procedure. If your Diastolic (lower reading)is above 100 mmHg, elective cases will be cancelled/rescheduled. . Blood thinners: These will need to be stopped for procedures. Notify our staff if you are taking any blood thinners. Depending on which one you take, there will be specific instructions on how and when to stop it. . Diabetics on insulin: Notify the staff so that you can be scheduled 1st case in the morning. If your diabetes requires high dose insulin, take only  of your normal insulin dose the morning of the procedure and notify the staff that you have done so. . Preventing infections: Shower with an antibacterial soap the morning of your procedure. . Build-up your immune system: Take 1000 mg of Vitamin C with every meal (3 times a day) the day prior to your procedure. . Antibiotics: Inform the staff if you have a condition or reason that requires you to take antibiotics before dental procedures. . Pregnancy: If you are pregnant, call and cancel the procedure. . Sickness: If you have a cold, fever, or any active infections, call and cancel the procedure. . Arrival: You must be in the facility at least 30 minutes prior to your scheduled procedure. . Children: Do not bring children with you. . Dress appropriately:  Bring dark clothing that you would not mind if they get stained. . Valuables: Do not bring any jewelry or valuables.  Reasons to call and reschedule or cancel your procedure: (Following these recommendations will minimize the risk of a serious complication.) . Surgeries: Avoid having procedures within 2 weeks of any surgery. (Avoid for 2 weeks before or after any surgery). . Flu Shots: Avoid having procedures within 2 weeks of a flu shots or . (Avoid for 2 weeks before or after immunizations). . Barium: Avoid having a procedure within 7-10 days after having had a radiological study involving the use of radiological contrast. (Myelograms, Barium swallow or enema study). . Heart attacks: Avoid any elective procedures or surgeries for the initial 6 months after a "Myocardial Infarction" (Heart Attack). . Blood thinners: It is imperative that you stop these medications before procedures. Let us know if you if you take any blood thinner.  . Infection: Avoid procedures during or within two weeks of an infection (including chest colds or gastrointestinal problems). Symptoms associated with infections include: Localized redness, fever, chills, night sweats or profuse sweating, burning sensation when voiding, cough, congestion, stuffiness, runny nose, sore throat, diarrhea, nausea, vomiting, cold or Flu symptoms, recent or current infections. It is specially important if the infection is over the area that we intend to treat. . Heart and lung problems: Symptoms that may suggest an active cardiopulmonary problem include: cough, chest pain, breathing difficulties or shortness of breath, dizziness, ankle swelling, uncontrolled high or unusually low blood pressure, and/or palpitations. If you are experiencing any of these symptoms, cancel your procedure and contact your primary care physician for an evaluation.  Remember:  Regular Business hours are:    Monday to Thursday 8:00 AM to 4:00 PM  Provider's  Schedule: Breonna Gafford, MD:  Procedure days: Tuesday and Thursday 7:30 AM to 4:00 PM  Bilal Lateef, MD:  Procedure days: Monday and Wednesday 7:30 AM to 4:00 PM ____________________________________________________________________________________________    

## 2020-05-14 ENCOUNTER — Ambulatory Visit: Payer: BLUE CROSS/BLUE SHIELD | Admitting: Pain Medicine

## 2020-05-23 ENCOUNTER — Other Ambulatory Visit: Payer: Self-pay | Admitting: Psychiatry

## 2020-05-23 DIAGNOSIS — F5101 Primary insomnia: Secondary | ICD-10-CM

## 2020-05-27 NOTE — Progress Notes (Signed)
PROVIDER NOTE: Information contained herein reflects review and annotations entered in association with encounter. Interpretation of such information and data should be left to medically-trained personnel. Information provided to patient can be located elsewhere in the medical record under "Patient Instructions". Document created using STT-dictation technology, any transcriptional errors that may result from process are unintentional.    Patient: Laura [name]  Service Category: Procedure  Provider: Oswaldo Done, MD  DOB: 29-Nov-1945  DOS: 05/28/2020  Location: [facility]  MRN: [id number]  Setting: Ambulatory - outpatient  Referring Provider: Lynnea Ferrier, MD  Type: Established Patient  Specialty: Interventional Pain Management  PCP: Lynnea Ferrier, MD   Primary Reason for Visit: Interventional Pain Management Treatment. CC: Back Pain (lower)  Procedure:          Anesthesia, Analgesia, Anxiolysis:  Type: Therapeutic Inter-Laminar Epidural Steroid Injection  #2  Region: Lumbar Level: L2-3 Level. Laterality: Midline         Type: Local Anesthesia Indication(s): Analgesia         Route: Infiltration ([place]/IM) IV Access: Declined Sedation: Declined  Local Anesthetic: Lidocaine 1-2%  Position: Prone with head of the table was raised to facilitate breathing.   Indications: 1. Chronic low back pain (1ry area of Pain) (Bilateral) (L>R) w/ sciatica (Bilateral)   2. DDD (degenerative disc disease), lumbosacral   3. 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 Lumbosacral radiculopathy at L5 (Left)   6. Abnormal MRI, lumbar spine (12/26/2019)   7. Ligamentum flavum hypertrophy   8. Lumbar lateral recess stenosis (Bilateral) (L2-3, L3-4, L4-5) (Severe: L3-4)   9. Lumbar Grade 1 Anterolisthesis of L3/L4 and L4/L5   10. Chronic pain of lower extremity (Bilateral)   11.  Chronic radicular pain of lower extremity (Bilateral)   12. Chronic anticoagulation (Plaquenil)    Pain Score: Pre-procedure: 7 /10 Post-procedure: 0-No pain/10   Pre-op H&P Assessment:  Laura [name] is a 75 y.o. (year old), female patient, seen today for interventional treatment. She  has a past surgical history that includes Abdominal hysterectomy; Cesarean section; Replacement total knee (Left); and Hip surgery (Right). Laura [name] has a current medication list which includes the following prescription(s): albuterol, aspirin ec, azelastine, chlorthalidone, vitamin d, duloxetine, empagliflozin, folic acid, hydroxychloroquine, hydroxyzine, infliximab, levothyroxine, levothyroxine, loratadine, methotrexate, metoprolol succinate, multi-vitamins, [START ON 06/13/2020] oxycodone, rosuvastatin, spironolactone, valacyclovir, and zaleplon. Her primarily concern today is the Back Pain (lower)  Initial Vital Signs:  Pulse/HCG Rate: 69ECG Heart Rate: 93 Temp: (!) 97.3 F (36.3 C) Resp: 20 BP: 124/88 SpO2: 97 %  BMI: Estimated body mass index is 44.1 kg/m as calculated from the following:   Height as of this encounter: 5\' 5"  (1.651 m).   Weight as of this encounter: 265 lb (120.2 kg).  Risk Assessment: Allergies: Reviewed. She is allergic to bupropion and penicillins.  Allergy Precautions: None required Coagulopathies: Reviewed. None identified.  Blood-thinner therapy: None at this time Active Infection(s): Reviewed. None identified. Laura [name] is afebrile  Site Confirmation: Laura [name] was asked to confirm the procedure and laterality before marking the site Procedure checklist: Completed Consent: Before the procedure and under the influence of no sedative(s), amnesic(s), or anxiolytics, the patient was informed of the treatment options, risks and possible complications. To fulfill our ethical and legal obligations, as recommended by the American Medical Association's Code of Ethics, I have  informed the  patient of my clinical impression; the nature and purpose of the treatment or procedure; the risks, benefits, and possible complications of the intervention; the alternatives, including doing nothing; the risk(s) and benefit(s) of the alternative treatment(s) or procedure(s); and the risk(s) and benefit(s) of doing nothing. The patient was provided information about the general risks and possible complications associated with the procedure. These may include, but are not limited to: failure to achieve desired goals, infection, bleeding, organ or nerve damage, allergic reactions, paralysis, and death. In addition, the patient was informed of those risks and complications associated to Spine-related procedures, such as failure to decrease pain; infection (i.e.: Meningitis, epidural or intraspinal abscess); bleeding (i.e.: epidural hematoma, subarachnoid hemorrhage, or any other type of intraspinal or peri-dural bleeding); organ or nerve damage (i.e.: Any type of peripheral nerve, nerve root, or spinal cord injury) with subsequent damage to sensory, motor, and/or autonomic systems, resulting in permanent pain, numbness, and/or weakness of one or several areas of the body; allergic reactions; (i.e.: anaphylactic reaction); and/or death. Furthermore, the patient was informed of those risks and complications associated with the medications. These include, but are not limited to: allergic reactions (i.e.: anaphylactic or anaphylactoid reaction(s)); adrenal axis suppression; blood sugar elevation that in diabetics may result in ketoacidosis or comma; water retention that in patients with history of congestive heart failure may result in shortness of breath, pulmonary edema, and decompensation with resultant heart failure; weight gain; swelling or edema; medication-induced neural toxicity; particulate matter embolism and blood vessel occlusion with resultant organ, and/or nervous system infarction; and/or  aseptic necrosis of one or more joints. Finally, the patient was informed that Medicine is not an exact science; therefore, there is also the possibility of unforeseen or unpredictable risks and/or possible complications that may result in a catastrophic outcome. The patient indicated having understood very clearly. We have given the patient no guarantees and we have made no promises. Enough time was given to the patient to ask questions, all of which were answered to the patient's satisfaction. Ms. Ewing has indicated that she wanted to continue with the procedure. Attestation: I, the ordering provider, attest that I have discussed with the patient the benefits, risks, side-effects, alternatives, likelihood of achieving goals, and potential problems during recovery for the procedure that I have provided informed consent. Date  Time: 05/28/2020 11:17 AM  Pre-Procedure Preparation:  Monitoring: As per clinic protocol. Respiration, ETCO2, SpO2, BP, heart rate and rhythm monitor placed and checked for adequate function Safety Precautions: Patient was assessed for positional comfort and pressure points before starting the procedure. Time-out: I initiated and conducted the "Time-out" before starting the procedure, as per protocol. The patient was asked to participate by confirming the accuracy of the "Time Out" information. Verification of the correct person, site, and procedure were performed and confirmed by me, the nursing staff, and the patient. "Time-out" conducted as per Joint Commission's Universal Protocol (UP.01.01.01). Time: 1301  Description of Procedure:          Target Area: The interlaminar space, initially targeting the lower laminar border of the superior vertebral body. Approach: Paramedial approach. Area Prepped: Entire Posterior Lumbar Region DuraPrep (Iodine Povacrylex [0.7% available iodine] and Isopropyl Alcohol, 74% w/w) Safety Precautions: Aspiration looking for blood return was  conducted prior to all injections. At no point did we inject any substances, as a needle was being advanced. No attempts were made at seeking any paresthesias. Safe injection practices and needle disposal techniques used. Medications properly checked for expiration dates. SDV (  single dose vial) medications used. Description of the Procedure: Protocol guidelines were followed. The procedure needle was introduced through the skin, ipsilateral to the reported pain, and advanced to the target area. Bone was contacted and the needle walked caudad, until the lamina was cleared. The epidural space was identified using "loss-of-resistance technique" with 2-3 ml of PF-NaCl (0.9% NSS), in a 5cc LOR glass syringe.  Vitals:   05/28/20 1117 05/28/20 1305 05/28/20 1307  BP: 124/88 (!) 137/100 (!) 160/96  Pulse: 69    Resp: Temp: (!) 97.3 F (36.3 C)    TempSrc: Temporal    SpO2: 97% 98% 98%  Weight: 265 lb (120.2 kg)    Height:  (1.651 m)      Start Time: 1301 hrs. End Time: 1306 hrs.  Materials:  Needle(s) Type: Epidural needle Gauge: 17G Length: 3.5-in Medication(s): Please see orders for medications and dosing details.  Imaging Guidance (Spinal):          Type of Imaging Technique: Fluoroscopy Guidance (Spinal) Indication(s): Assistance in needle guidance and placement for procedures requiring needle placement in or near specific anatomical locations not easily accessible without such assistance. Exposure Time: Please see nurses notes. Contrast: Before injecting any contrast, we confirmed that the patient did not have an allergy to iodine, shellfish, or radiological contrast. Once satisfactory needle placement was completed at the desired level, radiological contrast was injected. Contrast injected under live fluoroscopy. No contrast complications. See chart for type and volume of contrast used. Fluoroscopic Guidance: I was personally present during the use of fluoroscopy. "Tunnel  Vision Technique" used to obtain the best possible view of the target area. Parallax error corrected before commencing the procedure. "Direction-depth-direction" technique used to introduce the needle under continuous pulsed fluoroscopy. Once target was reached, antero-posterior, oblique, and lateral fluoroscopic projection used confirm needle placement in all planes. Images permanently stored in EMR. Interpretation: I personally interpreted the imaging intraoperatively. Adequate needle placement confirmed in multiple planes. Appropriate spread of contrast into desired area was observed. No evidence of afferent or efferent intravascular uptake. No intrathecal or subarachnoid spread observed. Permanent images saved into the patient's record.  Antibiotic Prophylaxis:   Anti-infectives (From admission, onward)   None     Indication(s): None identified  Post-operative Assessment:  Post-procedure Vital Signs:  Pulse/HCG Rate: 6987 Temp: (!) 97.3 F (36.3 C) Resp: 16 BP: (!) 160/96 SpO2: 98 %  EBL: None  Complications: No immediate post-treatment complications observed by team, or reported by patient.  Note: The patient tolerated the entire procedure well. A repeat set of vitals were taken after the procedure and the patient was kept under observation following institutional policy, for this type of procedure. Post-procedural neurological assessment was performed, showing return to baseline, prior to discharge. The patient was provided with post-procedure discharge instructions, including a section on how to identify potential problems. Should any problems arise concerning this procedure, the patient was given instructions to immediately contact us, at any time, without hesitation. In any case, we plan to contact the patient by telephone for a follow-up status report regarding this interventional procedure.  Comments:  No additional relevant information.  Plan of Care  Orders:  Orders Placed  This Encounter  Procedures  . Lumbar Epidural Injection    Scheduling Instructions:     Procedure: Interlaminar LESI L2-3     Laterality: Midline     Sedation: Patient's choice     Timeframe:  Today    Order Specific Question:  Where will this procedure be performed?    Answer:   [facility]  . DG [facility] C-ARM 1-60 MIN NO REPORT    Intraoperative interpretation by procedural physician at [facility].    Standing Status:   Standing    Number of Occurrences:   1    Order Specific Question:   Reason for exam:    Answer:   Assistance in needle guidance and placement for procedures requiring needle placement in or near specific anatomical locations not easily accessible without such assistance.  . Informed Consent Details: Physician/Practitioner Attestation; Transcribe to consent form and obtain patient signature    Note: Always confirm laterality of pain with Ms. Hebdon, before procedure. Transcribe to consent form and obtain patient signature.    Order Specific Question:   Physician/Practitioner attestation of informed consent for procedure/surgical case    Answer:   I, the physician/practitioner, attest that I have discussed with the patient the benefits, risks, side effects, alternatives, likelihood of achieving goals and potential problems during recovery for the procedure that I have provided informed consent.    Order Specific Question:   Procedure    Answer:   Lumbar epidural steroid injection under fluoroscopic guidance    Order Specific Question:   Physician/Practitioner performing the procedure    Answer:   Belita Warsame A. 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 Care order/instruction: Please confirm that the patient has stopped the Plaquenil (Hydroxychloroquine) x 11 days prior to procedure or surgery.    Please confirm that the patient has stopped the  Plaquenil (Hydroxychloroquine) x 11 days prior to procedure or surgery.    Standing Status:   Standing    Number of Occurrences:   1  . Provide equipment / supplies at bedside    "Epidural Tray" (Disposable  single use) Catheter: NOT required    Standing Status:   Standing    Number of Occurrences:   1    Order Specific Question:   Specify    Answer:   Epidural Tray  . Bleeding precautions    Standing Status:   Standing    Number of Occurrences:   1   Chronic Opioid Analgesic:  Oxycodone IR 5 mg every 6 hours (20 mg/day) MME/day:30 mg/day.   Medications ordered for procedure: Meds ordered this encounter  Medications  . iohexol (OMNIPAQUE) 180 MG/ML injection 10 mL    Must be Myelogram-compatible. If not available, you may substitute with a water-soluble, non-ionic, hypoallergenic, myelogram-compatible radiological contrast medium.  Marland Kitchen lidocaine (XYLOCAINE) 2 % (with pres) injection 400 mg  . sodium chloride flush (NS) 0.9 % injection 2 mL  . ropivacaine (PF) 2 mg/mL (0.2%) (NAROPIN) injection 2 mL  . triamcinolone acetonide (KENALOG-40) injection 40 mg   Medications administered: We administered iohexol, lidocaine, sodium chloride flush, ropivacaine (PF) 2 mg/mL (0.2%), and triamcinolone acetonide.  See the medical record for exact dosing, route, and time of administration.  Follow-up plan:   Return in about 2 weeks (around 06/11/2020) for procedure day (afternoon VV) (PPE).       Interventional Therapies  Risk  Complexity Considerations:   Estimated body mass index is 44.1 kg/m as calculated from the following:   Height as of this encounter: 5\' 5"  (1.651 m).   Weight as of this encounter: 265 lb (120.2 kg). PLAQUENIL Anticoagulation (Stop: 11 days)   Planned  Pending:   Therapeutic midline L2-3 LESI #2 (05/28/2020)    Under consideration:   Diagnostic bilateral genicular NB  Diagnostic bilateral L3 and/or L5 TFESI    Completed:   Therapeutic right IA steroid knee  injection x2 (10/15/2015)  Therapeutic right Hyalgan knee injection x1 (09/01/2016)  Therapeutic left lumbar facet block x1 (01/22/2015)  Therapeutic left IA hip injection x1 (03/17/2015)  Therapeutic left L4-5 LESI x1 (02/04/2020)  Therapeutic midline L2-3 LESI x1 (04/14/2020)    Therapeutic  Palliative (PRN) options:   Therapeutic right IA Hyalgan knee injections Palliative left IA hip joint injection  Diagnostic left lumbar facet block #2      Recent Visits Date Type Provider Dept  05/05/20 Telemedicine Delano Metz, MD [facility]  04/14/20 Procedure visit Delano Metz, MD [facility]  Showing recent visits within past 90 days and meeting all other requirements Today's Visits Date Type Provider Dept  05/28/20 Procedure visit Delano Metz, MD [facility]  Showing today's visits and meeting all other requirements Future Appointments No visits were found meeting these conditions. Showing future appointments within next 90 days and meeting all other requirements  Disposition: Discharge home  Discharge (Date  Time): 05/28/2020; 1315 hrs.   Primary Care Physician: Lynnea Ferrier, MD Location: [facility] Note by: Oswaldo Done, MD Date: 05/28/2020; Time: 1:12 PM  Disclaimer:  Medicine is not an Visual merchandiser. The only guarantee in medicine is that nothing is guaranteed. It is important to note that the decision to proceed with this intervention was based on the information collected from the patient. The Data and conclusions were drawn from the patient's questionnaire, the interview, and the physical examination. Because the information was provided in large part by the patient, it cannot be guaranteed that it has not been purposely or unconsciously manipulated. Every effort has been made to obtain as much relevant data as possible for this evaluation. It is important to note that the conclusions that lead  to this procedure are derived in large part from the available data. Always take into account that the treatment will also be dependent on availability of resources and existing treatment guidelines, considered by other Pain Management Practitioners as being common knowledge and practice, at the time of the intervention. For Medico-Legal purposes, it is also important to point out that variation in procedural techniques and pharmacological choices are the acceptable norm. The indications, contraindications, technique, and results of the above procedure should only be interpreted and judged by a Board-Certified Interventional Pain Specialist with extensive familiarity and expertise in the same exact procedure and technique.

## 2020-05-28 ENCOUNTER — Ambulatory Visit (HOSPITAL_BASED_OUTPATIENT_CLINIC_OR_DEPARTMENT_OTHER): Payer: Medicare Other | Admitting: Pain Medicine

## 2020-05-28 ENCOUNTER — Encounter: Payer: Self-pay | Admitting: Pain Medicine

## 2020-05-28 ENCOUNTER — Ambulatory Visit
Admission: RE | Admit: 2020-05-28 | Discharge: 2020-05-28 | Disposition: A | Discharge status: Home / Self Care | Payer: Medicare Other | Source: Ambulatory Visit | Attending: Pain Medicine | Admitting: Pain Medicine

## 2020-05-28 ENCOUNTER — Other Ambulatory Visit: Payer: Self-pay

## 2020-05-28 VITALS — BP 160/96 | HR 69 | Temp 97.3°F | Resp 16 | Ht 65.0 in | Wt 265.0 lb

## 2020-05-28 DIAGNOSIS — G8929 Other chronic pain: Secondary | ICD-10-CM | POA: Diagnosis present

## 2020-05-28 DIAGNOSIS — M79605 Pain in left leg: Secondary | ICD-10-CM | POA: Insufficient documentation

## 2020-05-28 DIAGNOSIS — M48061 Spinal stenosis, lumbar region without neurogenic claudication: Secondary | ICD-10-CM | POA: Insufficient documentation

## 2020-05-28 DIAGNOSIS — M5410 Radiculopathy, site unspecified: Secondary | ICD-10-CM | POA: Insufficient documentation

## 2020-05-28 DIAGNOSIS — M5417 Radiculopathy, lumbosacral region: Secondary | ICD-10-CM | POA: Insufficient documentation

## 2020-05-28 DIAGNOSIS — M4316 Spondylolisthesis, lumbar region: Secondary | ICD-10-CM | POA: Insufficient documentation

## 2020-05-28 DIAGNOSIS — M5137 Other intervertebral disc degeneration, lumbosacral region: Secondary | ICD-10-CM

## 2020-05-28 DIAGNOSIS — M2428 Disorder of ligament, vertebrae: Secondary | ICD-10-CM | POA: Insufficient documentation

## 2020-05-28 DIAGNOSIS — Z7901 Long term (current) use of anticoagulants: Secondary | ICD-10-CM

## 2020-05-28 DIAGNOSIS — M5442 Lumbago with sciatica, left side: Secondary | ICD-10-CM

## 2020-05-28 DIAGNOSIS — M5441 Lumbago with sciatica, right side: Secondary | ICD-10-CM | POA: Diagnosis present

## 2020-05-28 DIAGNOSIS — M48062 Spinal stenosis, lumbar region with neurogenic claudication: Secondary | ICD-10-CM

## 2020-05-28 DIAGNOSIS — M79604 Pain in right leg: Secondary | ICD-10-CM | POA: Diagnosis present

## 2020-05-28 DIAGNOSIS — R937 Abnormal findings on diagnostic imaging of other parts of musculoskeletal system: Secondary | ICD-10-CM | POA: Diagnosis present

## 2020-05-28 DIAGNOSIS — M549 Dorsalgia, unspecified: Secondary | ICD-10-CM | POA: Insufficient documentation

## 2020-05-28 MED ORDER — LIDOCAINE HCL (PF) 2 % IJ SOLN
INTRAMUSCULAR | Status: AC
  Filled 2020-05-28: qty 10

## 2020-05-28 MED ORDER — SODIUM CHLORIDE (PF) 0.9 % IJ SOLN
INTRAMUSCULAR | Status: AC
  Filled 2020-05-28: qty 10

## 2020-05-28 MED ORDER — SODIUM CHLORIDE 0.9% FLUSH
2.0000 mL | Freq: Once | INTRAVENOUS | Status: AC
  Administered 2020-05-28: 10 mL

## 2020-05-28 MED ORDER — LIDOCAINE HCL 2 % IJ SOLN
20.0000 mL | Freq: Once | INTRAMUSCULAR | Status: AC
  Administered 2020-05-28: 200 mg

## 2020-05-28 MED ORDER — ROPIVACAINE HCL 2 MG/ML IJ SOLN
2.0000 mL | Freq: Once | INTRAMUSCULAR | Status: AC
  Administered 2020-05-28: 10 mL via EPIDURAL
  Filled 2020-05-28: qty 10

## 2020-05-28 MED ORDER — IOHEXOL 180 MG/ML  SOLN
10.0000 mL | Freq: Once | INTRAMUSCULAR | Status: AC
  Administered 2020-05-28: 20 mL via EPIDURAL

## 2020-05-28 MED ORDER — TRIAMCINOLONE ACETONIDE 40 MG/ML IJ SUSP
40.0000 mg | Freq: Once | INTRAMUSCULAR | Status: AC
Start: 2020-05-28 — End: 2020-05-28
  Administered 2020-05-28: 40 mg
  Filled 2020-05-28: qty 1

## 2020-05-28 NOTE — Progress Notes (Signed)
Safety precautions to be maintained throughout the outpatient stay will include: orient to surroundings, keep bed in low position, maintain call bell within reach at all times, provide assistance with transfer out of bed and ambulation.  

## 2020-05-28 NOTE — Patient Instructions (Signed)

## 2020-05-29 ENCOUNTER — Telehealth: Payer: Self-pay

## 2020-05-29 NOTE — Telephone Encounter (Signed)
Post procedure phone call.  LM 

## 2020-06-15 ENCOUNTER — Encounter: Payer: Self-pay | Admitting: Pain Medicine

## 2020-06-16 ENCOUNTER — Telehealth: Payer: Self-pay

## 2020-06-16 ENCOUNTER — Ambulatory Visit: Payer: Medicare Other | Attending: Pain Medicine | Admitting: Pain Medicine

## 2020-06-16 ENCOUNTER — Other Ambulatory Visit: Payer: Self-pay

## 2020-06-16 ENCOUNTER — Other Ambulatory Visit: Payer: Self-pay | Admitting: Internal Medicine

## 2020-06-16 DIAGNOSIS — M5417 Radiculopathy, lumbosacral region: Secondary | ICD-10-CM

## 2020-06-16 DIAGNOSIS — M5442 Lumbago with sciatica, left side: Secondary | ICD-10-CM | POA: Diagnosis not present

## 2020-06-16 DIAGNOSIS — Z1231 Encounter for screening mammogram for malignant neoplasm of breast: Secondary | ICD-10-CM

## 2020-06-16 DIAGNOSIS — M5441 Lumbago with sciatica, right side: Secondary | ICD-10-CM

## 2020-06-16 DIAGNOSIS — M48062 Spinal stenosis, lumbar region with neurogenic claudication: Secondary | ICD-10-CM

## 2020-06-16 DIAGNOSIS — G8929 Other chronic pain: Secondary | ICD-10-CM

## 2020-06-16 DIAGNOSIS — Z7901 Long term (current) use of anticoagulants: Secondary | ICD-10-CM

## 2020-06-16 DIAGNOSIS — M48061 Spinal stenosis, lumbar region without neurogenic claudication: Secondary | ICD-10-CM | POA: Diagnosis not present

## 2020-06-16 DIAGNOSIS — M5137 Other intervertebral disc degeneration, lumbosacral region: Secondary | ICD-10-CM | POA: Diagnosis not present

## 2020-06-16 DIAGNOSIS — G894 Chronic pain syndrome: Secondary | ICD-10-CM

## 2020-06-16 NOTE — Telephone Encounter (Signed)
Pt was called with no answer. Message was left on answering service to call the clinic.

## 2020-06-16 NOTE — Progress Notes (Signed)
Patient: Laura [name]  Service Category: Laura/M  Provider: Gaspar Cola, MD  DOB: 05/13/1945  DOS: 06/16/2020  Location: Office  MRN: [id number]  Setting: Ambulatory outpatient  Referring Provider: Adin Hector, MD  Type: Established Patient  Specialty: Interventional Pain Management  PCP: Adin Hector, MD  Location: Remote location  Delivery: TeleHealth     Virtual Encounter - Pain Management PROVIDER NOTE: Information contained herein reflects review and annotations entered in association with encounter. Interpretation of such information and data should be left to medically-trained personnel. Information provided to patient can be located elsewhere in the medical record under "Patient Instructions". Document created using STT-dictation technology, any transcriptional errors that may result from process are unintentional.    Contact & Pharmacy Preferred: [phone] Home: [phone] (home) Mobile: [phone] (mobile) Laura-mail: [email or web address]  [id number] [id number]: [phone]: [phone]  Pre-screening  Laura [name] offered "in-person" vs "virtual" encounter. She indicated preferring virtual for this encounter.   Reason COVID-19*  Social distancing based on CDC and AMA recommendations.   I contacted EHaniyah [name] 06/16/2020 via telephone.      I clearly identified myself as FGaspar Cola MD. I verified that I was speaking with the correct person using two identifiers (Name: ESatori [name] and date of birth: [birth year].  Consent I sought verbal advanced consent from EMorene Rankinsfor virtual visit interactions. I informed Laura [name] of possible security and privacy concerns, risks, and limitations associated with providing "not-in-person" medical evaluation and management services. I also informed Laura [name] of the availability of "in-person" appointments.  Finally, I informed her that there would be a charge for the virtual visit and that she could be  personally, fully or partially, financially responsible for it. Ms. AZahniserexpressed understanding and agreed to proceed.   Historic Elements   Ms. EAbigayle Wilinskiis a 75y.o. year old, female patient evaluated today after our last contact on 05/28/2020. [name] has a past medical history of Allergy, Anxiety, Arthritis, degenerative (10/08/2013), Asthma, Chronic hand pain, left (10/10/2019), Chronic kidney disease, Depression, Lumbar spinal stenosis with neurogenic claudication (11/07/2014), Major depression, single episode, in complete remission ([place] (06/[place]/2015), Memory loss, short term (03/19/2014), Sciatica of left side (12/31/2019), [place] (10/07/2014), Sleep apnea, Sleep apnea, and Thyroid disease. She also  has a past surgical history that includes Abdominal hysterectomy; Cesarean section; Replacement total knee (Left); and Hip surgery (Right). Ms. AGarverhas a current medication list which includes the following prescription(s): albuterol, aspirin ec, azelastine, chlorthalidone, vitamin d, duloxetine, empagliflozin, folic acid, hydroxychloroquine, hydroxyzine, infliximab, levothyroxine, levothyroxine, loratadine, methotrexate, metoprolol succinate, multi-vitamins, oxycodone, rosuvastatin, spironolactone, valacyclovir, and zaleplon. She  reports that she has never smoked. She has never used smokeless tobacco. She reports that she does not drink alcohol and does not use drugs. Ms. AKieltyis allergic to bupropion and penicillins.   HPI  Today, she is being contacted for a post-procedure assessment.  The patient describes a 90% ongoing relief of the pain with her second lumbar epidural steroid injection.  The patient indicates that she has began to experience the return of some of the leg pain and although it is not really bad at this point, she could use the third injection.  We will go ahead and  set that up as soon as possible.  In looking at her future appointments, for some reason we had her schedule on 08/11/2020 for  that third epidural steroid injection, but we probably need to do the injection a lot sooner than that so that we do not allow the inflammatory process to get worse.  In addition, the patient has enough medication to last until 07/10/2020 and therefore we need to have her come back before then to do her medication evaluation and possible refill.  Post-Procedure Evaluation  Procedure (05/28/2020): Therapeutic midline L2-3 LESI #2 under fluoroscopic guidance, no sedation Pre-procedure pain level: 7/10 Post-procedure: 0/10 (100% relief)  Sedation: None.  Effectiveness during initial hour after procedure(Ultra-Short Term Relief): 100 %.  Local anesthetic used: Long-acting (4-6 hours) Effectiveness: Defined as any analgesic benefit obtained secondary to the administration of local anesthetics. This carries significant diagnostic value as to the etiological location, or anatomical origin, of the pain. Duration of benefit is expected to coincide with the duration of the local anesthetic used.  Effectiveness during initial 4-6 hours after procedure(Short-Term Relief): 100 %.  Long-term benefit: Defined as any relief past the pharmacologic duration of the local anesthetics.  Effectiveness past the initial 6 hours after procedure(Long-Term Relief): 90 % (ongoing).  Current benefits: Defined as benefit that persist at this time.   Analgesia:  The patient describes a 90% ongoing relief of her pain with the second lumbar epidural steroid injection. Function: Laura [name] reports improvement in function ROM: Laura [name] reports improvement in ROM  Pharmacotherapy Assessment  Analgesic: Oxycodone IR 5 mg every 6 hours (20 mg/day) MME/day:30 mg/day.   Monitoring: [place] PMP: PDMP reviewed during this encounter.       Pharmacotherapy: No side-effects or adverse reactions  reported. Compliance: No problems identified. Effectiveness: Clinically acceptable. Plan: Refer to "POC".  UDS:  Summary  Date Value Ref Range Status  07/16/2019 Note  Final    Comment:    ==================================================================== [employer] Select 13 (MW) ==================================================================== Test                             Result       Flag       Units  Drug Present and Declared for Prescription Verification   Oxycodone                      1635         EXPECTED   ng/mg creat   Oxymorphone                    521          EXPECTED   ng/mg creat   Noroxycodone                   1941         EXPECTED   ng/mg creat   Noroxymorphone                 174          EXPECTED   ng/mg creat    Sources of oxycodone are scheduled prescription medications.    Oxymorphone, noroxycodone, and noroxymorphone are expected    metabolites of oxycodone. Oxymorphone is also available as a    scheduled prescription medication.  Drug Present not Declared for Prescription Verification   Tramadol                       136          UNEXPECTED ng/mg creat   O-Desmethyltramadol  53           UNEXPECTED ng/mg creat   N-Desmethyltramadol            77           UNEXPECTED ng/mg creat    Source of tramadol is a prescription medication. O-desmethyltramadol    and N-desmethyltramadol are expected metabolites of tramadol.  ==================================================================== Test                      Result    Flag   Units      Ref Range   Creatinine              103              mg/dL      >=20 ==================================================================== Declared Medications:  The flagging and interpretation on this report are based on the  following declared medications.  Unexpected results may arise from  inaccuracies in the declared medications.   **Note: The testing scope of this panel includes these  medications:   Oxycodone   **Note: The testing scope of this panel does not include the  following reported medications:   Albuterol  Amlodipine  Aspirin  Azelastine  Chlorthalidone  Duloxetine  Folic Acid  Hydroxychloroquine (Plaquenil)  Infliximab (Remicade)  Levothyroxine  Loratadine (Claritin)  Methotrexate  Metoprolol  Mirtazapine  Multivitamin  Rosuvastatin  Simvastatin (Zocor)  Spironolactone  Valacyclovir (Valtrex) ==================================================================== For clinical consultation, please call [phone]. ====================================================================     Laboratory Chemistry Profile   Renal Lab Results  Component Value Date   BUN 24 (H) 10/03/2019   CREATININE 1.10 (H) 10/03/2019   GFRAA 57 (L) 10/03/2019   GFRNONAA 49 (L) 10/03/2019     Hepatic Lab Results  Component Value Date   AST 23 10/03/2019   ALT 16 10/03/2019   ALBUMIN 4.4 10/03/2019   ALKPHOS 55 10/03/2019     Electrolytes Lab Results  Component Value Date   NA 136 10/03/2019   K 4.2 10/03/2019   CL 100 10/03/2019   CALCIUM 9.0 10/03/2019   MG 2.3 10/08/2018     Bone Lab Results  Component Value Date   25OHVITD1 36 07/15/2015   25OHVITD2 3.6 07/15/2015   25OHVITD3 32 07/15/2015     Inflammation (CRP: Acute Phase) (ESR: Chronic Phase) Lab Results  Component Value Date   CRP 1.9 (H) 07/15/2015   ESRSEDRATE 32 (H) 07/15/2015   LATICACIDVEN 1.4 10/07/2018       Note: Above Lab results reviewed.  Imaging  DG PAIN CLINIC C-ARM 1-60 MIN NO REPORT Fluoro was used, but no Radiologist interpretation will be provided.  Please refer to "NOTES" tab for provider progress note.  Assessment  The primary encounter diagnosis was Chronic low back pain (1ry area of Pain) (Bilateral) (L>R) w/ sciatica (Bilateral). 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  Plan of Care  Problem-specific:  No problem-specific Assessment & Plan notes found for this encounter.  Ms. Skyelynn Rambeau has a current medication list which includes the following long-term medication(s): albuterol, azelastine, chlorthalidone, duloxetine, infliximab, levothyroxine, levothyroxine, loratadine, metoprolol succinate, oxycodone, spironolactone, and zaleplon.  Pharmacotherapy (Medications Ordered): No orders of the defined types were placed in this encounter.  Orders:  Orders Placed This  Encounter  Procedures  . Lumbar Epidural Injection    Standing Status:   Future    Standing Expiration Date:   07/16/2020    Scheduling Instructions:     Procedure: Interlaminar Lumbar Epidural Steroid injection (LESI)  L2-3     Laterality: Midline     Sedation: Patient's choice.     Timeframe: ASAA    Order Specific Question:   Where will this procedure be performed?    Answer:   [facility]  . Blood Thinner Instructions to Nursing    Always make sure patient has clearance from prescribing physician to stop blood thinners for interventional therapies. If the patient requires a Lovenox-bridge therapy, make sure arrangements are made to institute it with the assistance of the PCP.    Scheduling Instructions:     Have Ms. Roseland stop the Plaquenil (Hydroxychloroquine) x 11 days prior to procedure or surgery.   Follow-up plan:   Return for Procedure (no sedation): (ML) L2-3 LESI #3 + (MM) before 07/10/2020, (Blood Thinner Protocol).      Interventional Therapies  Risk  Complexity Considerations:   Estimated body mass index is 44.1 kg/m as calculated from the following:   Height as of this encounter: _0  (1.651 m).   Weight as of this encounter: 265 lb (120.2  kg). PLAQUENIL Anticoagulation (Stop: 11 days)   Planned  Pending:   Therapeutic midline L2-3 LESI #2 (05/28/2020)    Under consideration:   Diagnostic bilateral genicular NB  Diagnostic bilateral L3 and/or L5 TFESI    Completed:   Therapeutic right IA steroid knee injection x2 (10/15/2015)  Therapeutic right Hyalgan knee injection x1 (09/01/2016)  Therapeutic left lumbar facet block x1 (01/22/2015)  Therapeutic left IA hip injection x1 (03/17/2015)  Therapeutic left L4-5 LESI x1 (02/04/2020)  Therapeutic midline L2-3 LESI x1 (04/14/2020)    Therapeutic  Palliative (PRN) options:   Therapeutic right IA Hyalgan knee injections Palliative left IA hip joint injection  Diagnostic left lumbar facet block #2      Recent Visits Date Type Provider Dept  05/28/20 Procedure visit Milinda Pointer, MD [facility]  05/05/20 Telemedicine Milinda Pointer, MD [facility]  04/14/20 Procedure visit Milinda Pointer, MD [facility]  Showing recent visits within past 90 days and meeting all other requirements Today's Visits Date Type Provider Dept  06/16/20 Telemedicine Milinda Pointer, MD [facility]  Showing today's visits and meeting all other requirements Future Appointments Date Type Provider Dept  08/11/20 Appointment Milinda Pointer, MD [facility]  Showing future appointments within next 90 days and meeting all other requirements  I discussed the assessment and treatment plan with the patient. The patient was provided an opportunity to ask questions and all were answered. The patient agreed with the plan and demonstrated an understanding of the instructions.  Patient advised to call back or seek an in-person evaluation if the symptoms or condition worsens.  Duration of encounter: 15 minutes.  Note by: Gaspar Cola, MD Date: 06/16/2020; Time: 12:54 PM

## 2020-06-16 NOTE — Patient Instructions (Signed)
____________________________________________________________________________________________  Preparing for your procedure (without sedation)  Procedure appointments are limited to planned procedures: . No Prescription Refills. . No disability issues will be discussed. . No medication changes will be discussed.  Instructions: . Oral Intake: Do not eat or drink anything for at least 6 hours prior to your procedure. (Exception: Blood Pressure Medication. See below.) . Transportation: Unless otherwise stated by your physician, you may drive yourself after the procedure. . Blood Pressure Medicine: Do not forget to take your blood pressure medicine with a sip of water the morning of the procedure. If your Diastolic (lower reading)is above 100 mmHg, elective cases will be cancelled/rescheduled. . Blood thinners: These will need to be stopped for procedures. Notify our staff if you are taking any blood thinners. Depending on which one you take, there will be specific instructions on how and when to stop it. . Diabetics on insulin: Notify the staff so that you can be scheduled 1st case in the morning. If your diabetes requires high dose insulin, take only  of your normal insulin dose the morning of the procedure and notify the staff that you have done so. . Preventing infections: Shower with an antibacterial soap the morning of your procedure.  . Build-up your immune system: Take 1000 mg of Vitamin C with every meal (3 times a day) the day prior to your procedure. . Antibiotics: Inform the staff if you have a condition or reason that requires you to take antibiotics before dental procedures. . Pregnancy: If you are pregnant, call and cancel the procedure. . Sickness: If you have a cold, fever, or any active infections, call and cancel the procedure. . Arrival: You must be in the facility at least 30 minutes prior to your scheduled procedure. . Children: Do not bring any children with you. . Dress  appropriately: Bring dark clothing that you would not mind if they get stained. . Valuables: Do not bring any jewelry or valuables.  Reasons to call and reschedule or cancel your procedure: (Following these recommendations will minimize the risk of a serious complication.) . Surgeries: Avoid having procedures within 2 weeks of any surgery. (Avoid for 2 weeks before or after any surgery). . Flu Shots: Avoid having procedures within 2 weeks of a flu shots or . (Avoid for 2 weeks before or after immunizations). . Barium: Avoid having a procedure within 7-10 days after having had a radiological study involving the use of radiological contrast. (Myelograms, Barium swallow or enema study). . Heart attacks: Avoid any elective procedures or surgeries for the initial 6 months after a "Myocardial Infarction" (Heart Attack). . Blood thinners: It is imperative that you stop these medications before procedures. Let us know if you if you take any blood thinner.  . Infection: Avoid procedures during or within two weeks of an infection (including chest colds or gastrointestinal problems). Symptoms associated with infections include: Localized redness, fever, chills, night sweats or profuse sweating, burning sensation when voiding, cough, congestion, stuffiness, runny nose, sore throat, diarrhea, nausea, vomiting, cold or Flu symptoms, recent or current infections. It is specially important if the infection is over the area that we intend to treat. . Heart and lung problems: Symptoms that may suggest an active cardiopulmonary problem include: cough, chest pain, breathing difficulties or shortness of breath, dizziness, ankle swelling, uncontrolled high or unusually low blood pressure, and/or palpitations. If you are experiencing any of these symptoms, cancel your procedure and contact your primary care physician for an evaluation.  Remember:  Regular   Business hours are:  Monday to Thursday 8:00 AM to 4:00  PM  Provider's Schedule: Tywana Robotham, MD:  Procedure days: Tuesday and Thursday 7:30 AM to 4:00 PM  Bilal Lateef, MD:  Procedure days: Monday and Wednesday 7:30 AM to 4:00 PM ____________________________________________________________________________________________   ____________________________________________________________________________________________  General Risks and Possible Complications  Patient Responsibilities: It is important that you read this as it is part of your informed consent. It is our duty to inform you of the risks and possible complications associated with treatments offered to you. It is your responsibility as a patient to read this and to ask questions about anything that is not clear or that you believe was not covered in this document.  Patient's Rights: You have the right to refuse treatment. You also have the right to change your mind, even after initially having agreed to have the treatment done. However, under this last option, if you wait until the last second to change your mind, you may be charged for the materials used up to that point.  Introduction: Medicine is not an exact science. Everything in Medicine, including the lack of treatment(s), carries the potential for danger, harm, or loss (which is by definition: Risk). In Medicine, a complication is a secondary problem, condition, or disease that can aggravate an already existing one. All treatments carry the risk of possible complications. The fact that a side effects or complications occurs, does not imply that the treatment was conducted incorrectly. It must be clearly understood that these can happen even when everything is done following the highest safety standards.  No treatment: You can choose not to proceed with the proposed treatment alternative. The "PRO(s)" would include: avoiding the risk of complications associated with the therapy. The "CON(s)" would include: not getting any of the  treatment benefits. These benefits fall under one of three categories: diagnostic; therapeutic; and/or palliative. Diagnostic benefits include: getting information which can ultimately lead to improvement of the disease or symptom(s). Therapeutic benefits are those associated with the successful treatment of the disease. Finally, palliative benefits are those related to the decrease of the primary symptoms, without necessarily curing the condition (example: decreasing the pain from a flare-up of a chronic condition, such as incurable terminal cancer).  General Risks and Complications: These are associated to most interventional treatments. They can occur alone, or in combination. They fall under one of the following six (6) categories: no benefit or worsening of symptoms; bleeding; infection; nerve damage; allergic reactions; and/or death. 1. No benefits or worsening of symptoms: In Medicine there are no guarantees, only probabilities. No healthcare provider can ever guarantee that a medical treatment will work, they can only state the probability that it may. Furthermore, there is always the possibility that the condition may worsen, either directly, or indirectly, as a consequence of the treatment. 2. Bleeding: This is more common if the patient is taking a blood thinner, either prescription or over the counter (example: Goody Powders, Fish oil, Aspirin, Garlic, etc.), or if suffering a condition associated with impaired coagulation (example: Hemophilia, cirrhosis of the liver, low platelet counts, etc.). However, even if you do not have one on these, it can still happen. If you have any of these conditions, or take one of these drugs, make sure to notify your treating physician. 3. Infection: This is more common in patients with a compromised immune system, either due to disease (example: diabetes, cancer, human immunodeficiency virus [HIV], etc.), or due to medications or treatments (example: therapies used    to treat cancer and rheumatological diseases). However, even if you do not have one on these, it can still happen. If you have any of these conditions, or take one of these drugs, make sure to notify your treating physician. 4. Nerve Damage: This is more common when the treatment is an invasive one, but it can also happen with the use of medications, such as those used in the treatment of cancer. The damage can occur to small secondary nerves, or to large primary ones, such as those in the spinal cord and brain. This damage may be temporary or permanent and it may lead to impairments that can range from temporary numbness to permanent paralysis and/or brain death. 5. Allergic Reactions: Any time a substance or material comes in contact with our body, there is the possibility of an allergic reaction. These can range from a mild skin rash (contact dermatitis) to a severe systemic reaction (anaphylactic reaction), which can result in death. 6. Death: In general, any medical intervention can result in death, most of the time due to an unforeseen complication. ____________________________________________________________________________________________   

## 2020-06-23 ENCOUNTER — Ambulatory Visit (INDEPENDENT_AMBULATORY_CARE_PROVIDER_SITE_OTHER): Payer: Medicare Other | Admitting: Internal Medicine

## 2020-06-23 ENCOUNTER — Other Ambulatory Visit: Payer: Self-pay

## 2020-06-23 ENCOUNTER — Encounter: Payer: Self-pay | Admitting: Internal Medicine

## 2020-06-23 VITALS — BP 144/86 | HR 91 | Temp 98.1°F | Resp 16 | Ht 66.0 in | Wt 260.0 lb

## 2020-06-23 DIAGNOSIS — G4733 Obstructive sleep apnea (adult) (pediatric): Secondary | ICD-10-CM | POA: Diagnosis not present

## 2020-06-23 DIAGNOSIS — I639 Cerebral infarction, unspecified: Secondary | ICD-10-CM

## 2020-06-23 DIAGNOSIS — Z7189 Other specified counseling: Secondary | ICD-10-CM | POA: Diagnosis not present

## 2020-06-23 DIAGNOSIS — J4520 Mild intermittent asthma, uncomplicated: Secondary | ICD-10-CM

## 2020-06-23 DIAGNOSIS — R0602 Shortness of breath: Secondary | ICD-10-CM

## 2020-06-23 NOTE — Patient Instructions (Signed)
Sleep Apnea Sleep apnea affects breathing during sleep. It causes breathing to stop for 10 seconds or more, or to become shallow. People with sleep apnea usually snoreloudly. It can also increase the risk of: Heart attack. Stroke. Being very overweight (obese). Diabetes. Heart failure. Irregular heartbeat. High blood pressure. The goal of treatment is to help you breathe normally again. What are the causes?  The most common cause of this condition is a collapsed or blocked airway. There are three kinds of sleep apnea: Obstructive sleep apnea. This is caused by a blocked or collapsed airway. Central sleep apnea. This happens when the brain does not send the right signals to the muscles that control breathing. Mixed sleep apnea. This is a combination of obstructive and central sleep apnea. What increases the risk? Being overweight. Smoking. Having a small airway. Being older. Being female. Drinking alcohol. Taking medicines to calm yourself (sedatives or tranquilizers). Having family members with the condition. Having a tongue or tonsils that are larger than normal. What are the signs or symptoms? Trouble staying asleep. Loud snoring. Headaches in the morning. Waking up gasping. Dry mouth or sore throat in the morning. Being sleepy or tired during the day. If you are sleepy or tired during the day, you may also: Not be able to focus your mind (concentrate). Forget things. Get angry a lot and have mood swings. Feel sad (depressed). Have changes in your personality. Have less interest in sex, if you are female. Be unable to have an erection, if you are female. How is this treated?  Sleeping on your side. Using a medicine to get rid of mucus in your nose (decongestant). Avoiding the use of alcohol, medicines to help you relax, or certain pain medicines (narcotics). Losing weight, if needed. Changing your diet. Quitting smoking. Using a machine to open your airway while you  sleep, such as: An oral appliance. This is a mouthpiece that shifts your lower jaw forward. A CPAP device. This device blows air through a mask when you breathe out (exhale). An EPAP device. This has valves that you put in each nostril. A BPAP device. This device blows air through a mask when you breathe in (inhale) and breathe out. Having surgery if other treatments do not work. Follow these instructions at home: Lifestyle Make changes that your doctor recommends. Eat a healthy diet. Lose weight if needed. Avoid alcohol, medicines to help you relax, and some pain medicines. Do not smoke or use any products that contain nicotine or tobacco. If you need help quitting, ask your doctor. General instructions Take over-the-counter and prescription medicines only as told by your doctor. If you were given a machine to use while you sleep, use it only as told by your doctor. If you are having surgery, make sure to tell your doctor you have sleep apnea. You may need to bring your device with you. Keep all follow-up visits. Contact a doctor if: The machine that you were given to use during sleep bothers you or does not seem to be working. You do not get better. You get worse. Get help right away if: Your chest hurts. You have trouble breathing in enough air. You have an uncomfortable feeling in your back, arms, or stomach. You have trouble talking. One side of your body feels weak. A part of your face is hanging down. These symptoms may be an emergency. Get help right away. Call your local emergency services (911 in the [place] Do not wait to see if the symptoms   will go away. Do not drive yourself to the hospital. Summary This condition affects breathing during sleep. The most common cause is a collapsed or blocked airway. The goal of treatment is to help you breathe normally while you sleep. This information is not intended to replace advice given to you by your health care provider. Make  sure you discuss any questions you have with your healthcare provider. Document Revised: 12/06/2019 Document Reviewed: 12/06/2019 Elsevier Patient Education  2022 [employer] Inc[employer]  

## 2020-06-23 NOTE — Progress Notes (Signed)
[facility] [address] 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 Patient has had some issues with her toe being infected. She states that she has been off her arthritis medication due to this. Patient states that she is supposed to see a podiatrist. Patient denies any cough or congestion. Denies fevers at this time. She is using her CPAP device as prescribed. She sleeps better other than her urine frequency which is related to there underlying UTI  ROS  General: (-) fever, (-) chills, (-) night sweats, (-) weakness Skin: (-) rashes, (-) itching,. Eyes: (-) visual changes, (-) redness, (-) itching. Nose and Sinuses: (-) nasal stuffiness or itchiness, (-) postnasal drip, (-) nosebleeds, (-) sinus trouble. Mouth and Throat: (-) sore throat, (-) hoarseness. Neck: (-) swollen glands, (-) enlarged thyroid, (-) neck pain. Respiratory: - cough, (-) bloody sputum, - shortness of breath, - wheezing. Cardiovascular: + ankle swelling, (-) chest pain. Lymphatic: (-) lymph node enlargement. Neurologic: (-) numbness, (-) tingling. Psychiatric: (-) anxiety, (-) depression   Current Medication: Outpatient Encounter Medications as of 06/23/2020  Medication Sig Note   albuterol (VENTOLIN HFA) 108 (90 Base) MCG/ACT inhaler Inhale 2 puffs into the lungs every 6 (six) hours as needed for wheezing or shortness of breath.    aspirin EC 81 MG tablet Take by mouth.    azelastine (ASTELIN) 0.1 % nasal spray Place into the nose.    chlorthalidone (HYGROTON) 25 MG tablet TAKE 1/2 TABLET (12.5MG ) BY MOUTH EVERY DAY    Cholecalciferol (VITAMIN D) 2000 UNITS tablet Take by mouth daily.     DULoxetine (CYMBALTA) 60 MG capsule Take 1 capsule (60 mg total) by mouth daily.    empagliflozin (JARDIANCE) 10 MG TABS tablet Take 1 tablet  by mouth daily.    folic acid (FOLVITE) 1 MG tablet Take 1 mg by mouth daily.    hydroxychloroquine (PLAQUENIL) 200 MG tablet Take 100 mg by mouth daily.     hydrOXYzine (VISTARIL) 25 MG capsule Take 1-2 capsules (25-50 mg total) by mouth at bedtime as needed. For sleep    inFLIXimab (REMICADE) 100 MG injection Inject 100 mg into the vein every 8 (eight) weeks.    levothyroxine (SYNTHROID) 50 MCG tablet Take 25 mcg by mouth at bedtime.    levothyroxine (SYNTHROID, LEVOTHROID) 200 MCG tablet 200 mcg daily before breakfast.    loratadine (CLARITIN) 10 MG tablet Take 10 mg by mouth daily.     methotrexate (RHEUMATREX) 2.5 MG tablet TAKE 4 TABLETS (10 MG TOTAL) BY MOUTH EVERY 7 (SEVEN) DAYS WITH A MEAL 10/06/2018: Sundays    metoprolol succinate (TOPROL-XL) 100 MG 24 hr tablet Take 1 tablet (100 mg total) by mouth daily. Take with or immediately following a meal.    Multiple Vitamin (MULTI-VITAMINS) TABS Take by mouth.    oxyCODONE (OXY IR/ROXICODONE) 5 MG immediate release tablet Take 1 tablet (5 mg total) by mouth every 6 (six) hours as needed for severe pain. Must last 30 days 05/05/2020: FUTURE Prescription. (NOT a DUPLICATE!!) >>>DO NOT DELETE<<< (even if Expired!) See Care Coordination Note from [facility] (Dr. Laban Emperor)    rosuvastatin (CRESTOR) 5 MG tablet Take 5 mg by mouth 2 (two) times a week.    spironolactone (ALDACTONE) 25 MG tablet TAKE 1/2 (ONE-HALF) TABLET BY MOUTH DAILY    valACYclovir (VALTREX) 1000  MG tablet PLEASE SEE ATTACHED FOR DETAILED DIRECTIONS    zaleplon (SONATA) 10 MG capsule TAKE 1 CAPSULE (10 MG TOTAL) BY MOUTH AT BEDTIME AS NEEDED FOR SLEEP.    No facility-administered encounter medications on file as of 06/23/2020.    Surgical History: Past Surgical History:  Procedure Laterality Date   ABDOMINAL HYSTERECTOMY     CESAREAN SECTION     HIP SURGERY Right    REPLACEMENT TOTAL KNEE Left     Medical History: Past Medical History:  Diagnosis Date   Allergy     Anxiety    Arthritis, degenerative 10/08/2013   Overview:    a.  Lumbar spine/spinal stenosis/foot drop.   b.  Hands.    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   Memory loss, short term 03/19/2014   Sciatica of left side 12/31/2019   Seizure (HCC) 10/07/2014   Sleep apnea    Sleep apnea    Thyroid disease     Family History: Family History  Problem Relation Age of Onset   Hypertension Mother    Stroke Mother    Heart attack Father    Alcohol abuse Father    Depression Father    Post-traumatic stress disorder Father    Rheum arthritis Sister    Hypertension Sister    Depression Sister    Breast cancer Neg Hx     Social History: Social History   Socioeconomic History   Marital status: Married    Spouse name: Not on file   Number of children: Not on file   Years of education: Not on file   Highest education level: Not on file  Occupational History    Comment: retired  Tobacco Use   Smoking status: Never   Smokeless tobacco: Never  Vaping Use   Vaping Use: Never used  Substance and Sexual Activity   Alcohol use: No    Alcohol/week: 0.0 standard drinks   Drug use: No   Sexual activity: Not Currently  Other Topics Concern   Not on file  Social History Narrative   Not on file   Social Determinants of Health   Financial Resource Strain: Not on file  Food Insecurity: Not on file  Transportation Needs: Not on file  Physical Activity: Not on file  Stress: Not on file  Social Connections: Not on file  Intimate Partner Violence: Not on file    Vital Signs: Blood pressure (!) 144/86, pulse 91, temperature 98.1 F (36.7 C), resp. rate 16, height 5\' 6"  (1.676 m), weight 260 lb (117.9 kg), SpO2 96 %.  Examination: General Appearance: The patient is well-developed, well-nourished,  and in no distress. Skin: Gross inspection of skin unremarkable. Head: normocephalic, no gross deformities. Eyes: no gross deformities noted. ENT: ears appear grossly normal no exudates. Neck: Supple. No thyromegaly. No LAD. Respiratory: no rhonchi noted. Cardiovascular: Normal S1 and S2 without murmur or rub. Extremities: No cyanosis. pulses are equal. Neurologic: Alert and oriented. No involuntary movements.  LABS: No results found for this or any previous visit (from the past 2160 hour(s)).  Radiology: DG PAIN CLINIC C-ARM 1-60 MIN NO REPORT  Result Date: 05/28/2020 Fluoro was used, but no Radiologist interpretation will be provided. Please refer to "NOTES" tab for provider progress note.   No results found.  DG PAIN CLINIC  C-ARM 1-60 MIN NO REPORT  Result Date: 05/28/2020 Fluoro was used, but no Radiologist interpretation will be provided. Please refer to "NOTES" tab for provider progress note.     Assessment and Plan: Patient Active Problem List   Diagnosis Date Noted   Ligamentum flavum hypertrophy (L1-2, L2-3, L3-4) 05/28/2020   Lumbar lateral recess stenosis (Bilateral) (L2-3, L3-4, L4-5) (Severe: L3-4) 05/28/2020   Lumbar Grade 1 Anterolisthesis of L3/L4 and L4/L5 05/28/2020   Chronic radicular pain of lower extremity (Bilateral) 05/28/2020   Chronic use of opiate for therapeutic purpose 04/14/2020   Statin myopathy 03/20/2020   DDD (degenerative disc disease), lumbosacral 03/10/2020   Abnormal MRI, lumbar spine (12/26/2019) 02/19/2020   Chronic pain of lower extremity (Bilateral) 02/04/2020   Lumbosacral radiculopathy at L5 (Left) 02/04/2020   Chronic anticoagulation (Plaquenil) 01/20/2020   Cerebrovascular accident (CVA) (HCC) 01/02/2020   Sciatica (Left) 12/31/2019   Dropfoot (Left) 11/20/2019   Weakness of foot (Left) 11/20/2019   Weakness of leg (Left) 11/20/2019   Foot drop, left 11/20/2019   Chronic hand pain, left 10/10/2019   MDD (major depressive  disorder), recurrent, in full remission (HCC) 11/22/2018   Paroxysmal atrial fibrillation (HCC) 10/16/2018   Coronary artery disease involving native coronary artery of native heart without angina pectoris 10/15/2018   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   Pneumonia 10/06/2018   Immunosuppression (HCC) 09/18/2018   Pharmacologic therapy 07/11/2018   Disorder of skeletal system 07/11/2018   Problems influencing health status 07/11/2018   MDD (major depressive disorder), recurrent episode, mild (HCC) 07/09/2018   Primary insomnia 07/09/2018   Bereavement 07/09/2018   Body mass index (BMI) of 40.0-44.9 in adult (HCC) 10/09/2017   Postmenopausal 04/06/2017   Chronic arthralgias of knees and hips (Right) 08/18/2016   Arthritis 07/11/2016   Dyspnea on exertion 07/11/2016   Kidney disease 07/11/2016   Primary fibromyalgia syndrome 07/11/2016   Snoring 07/11/2016   Morbid obesity with BMI of 40.0-44.9, adult (HCC) 07/11/2016   Opioid-induced constipation (OIC) 04/21/2016   Osteoarthritis of knee (Right) 01/25/2016   Diet-controlled diabetes mellitus (HCC) 10/03/2015   GAD (generalized anxiety disorder) 09/24/2015   Disturbance of skin sensation 07/15/2015   Elevated sedimentation rate 07/15/2015   Elevated C-reactive protein (CRP) 07/15/2015   Chronic hip pain (Left) 04/06/2015   History of TKR (total knee replacement) (Left) 02/09/2015   History of femur fracture (Right) 02/09/2015   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) 02/09/2015   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) 02/09/2015   Lumbar foraminal stenosis (Bilateral L3-4 and L5-S1) 02/09/2015   Chronic low back pain (1ry area of Pain) (Bilateral) (L>R) w/ sciatica (Bilateral) 01/22/2015   Chronic pain syndrome 11/25/2014   Uncomplicated opioid dependence (HCC) 11/07/2014   Lumbar spinal stenosis (5 mm Severe L3-4; 8 mm L4-5) w/ neurogenic claudication 11/07/2014   Lumbar spondylosis 11/07/2014    Lumbar facet syndrome (Bilateral) (L>R) 11/07/2014   Chronic knee pain (Right) 11/05/2014   Opiate use (30 MME/Day) 11/05/2014   Long term current use of opiate analgesic 11/05/2014   Long term prescription opiate use 11/05/2014   Encounter for therapeutic drug level monitoring 11/05/2014   Morbid obesity (HCC) 10/27/2014   Extreme obesity (HCC) 10/07/2014   Type 2 diabetes mellitus (HCC) 03/19/2014   Depressive disorder 10/14/2013   Essential (primary) hypertension 10/14/2013   Insomnia secondary to chronic pain 07/09/2013   Anxiety 07/09/2013   GERD (gastroesophageal reflux disease) 01/18/2013   Hypothyroidism 01/18/2013   Hypercholesterolemia 01/18/2013   Seronegative rheumatoid  arthritis (HCC) 01/18/2013   CKD (chronic kidney disease) stage 3, GFR 30-59 ml/min (HCC) 01/17/2013   Congestive heart failure with left ventricular systolic dysfunction (HCC) 11/10/2012    1. SOB (shortness of breath) She is using her inhalers as prescribed. She feels good benefit from the usage of the inhalers - Spirometry with Graph  2. Obstructive sleep apnea On cpap therapy which will be continued. Good compliance and has improvement  3. Mild intermittent asthma without complication On inhalers PFT reviewed  4. CPAP use counseling CPAP Counseling: had a lengthy discussion with the patient regarding the importance of PAP therapy in management of the sleep apnea. Patient appears to understand the risk factor reduction and also understands the risks associated with untreated sleep apnea. Patient will try to make a good faith effort to remain compliant with therapy. Also instructed the patient on proper cleaning of the device including the water must be changed daily if possible and use of distilled water is preferred. Patient understands that the machine should be regularly cleaned with appropriate recommended cleaning solutions that do not damage the PAP machine for example given white vinegar and water  rinses. Other methods such as ozone treatment may not be as good as these simple methods to achieve cleaning.    General Counseling: I have discussed the findings of the evaluation and examination with Lanora Manis.  I have also discussed any further diagnostic evaluation thatmay be needed or ordered today. Jamirra verbalizes understanding of the findings of todays visit. We also reviewed her medications today and discussed drug interactions and side effects including but not limited excessive drowsiness and altered mental states. We also discussed that there is always a risk not just to her but also people around her. she has been encouraged to call the office with any questions or concerns that should arise related to todays visit.  Orders Placed This Encounter  Procedures   Spirometry with Graph    Order Specific Question:   Where should this test be performed?    Answer:   [facility]    Order Specific Question:   Basic spirometry    Answer:   Yes    Order Specific Question:   Spirometry pre & post bronchodilator    Answer:   No     Time spent: 11  I have personally obtained a history, examined the patient, evaluated laboratory and imaging results, formulated the assessment and plan and placed orders.    Yevonne Pax, MD [facility] Pulmonary and Critical Care Sleep medicine

## 2020-06-25 ENCOUNTER — Encounter: Payer: Self-pay | Admitting: Pain Medicine

## 2020-06-30 ENCOUNTER — Ambulatory Visit: Payer: BC Managed Care – PPO | Admitting: Pain Medicine

## 2020-07-07 ENCOUNTER — Ambulatory Visit (HOSPITAL_BASED_OUTPATIENT_CLINIC_OR_DEPARTMENT_OTHER): Payer: Medicare Other | Admitting: Pain Medicine

## 2020-07-07 ENCOUNTER — Other Ambulatory Visit: Payer: Self-pay

## 2020-07-07 ENCOUNTER — Encounter: Payer: Self-pay | Admitting: Pain Medicine

## 2020-07-07 ENCOUNTER — Ambulatory Visit
Admission: RE | Admit: 2020-07-07 | Discharge: 2020-07-07 | Disposition: A | Discharge status: Home / Self Care | Payer: Medicare Other | Source: Ambulatory Visit | Attending: Pain Medicine | Admitting: Pain Medicine

## 2020-07-07 VITALS — BP 164/87 | HR 105 | Temp 97.0°F | Resp 20 | Ht 66.0 in | Wt 255.0 lb

## 2020-07-07 DIAGNOSIS — M5441 Lumbago with sciatica, right side: Secondary | ICD-10-CM | POA: Diagnosis present

## 2020-07-07 DIAGNOSIS — M5137 Other intervertebral disc degeneration, lumbosacral region: Secondary | ICD-10-CM | POA: Diagnosis not present

## 2020-07-07 DIAGNOSIS — Z79891 Long term (current) use of opiate analgesic: Secondary | ICD-10-CM

## 2020-07-07 DIAGNOSIS — M79605 Pain in left leg: Secondary | ICD-10-CM | POA: Insufficient documentation

## 2020-07-07 DIAGNOSIS — M5410 Radiculopathy, site unspecified: Secondary | ICD-10-CM | POA: Insufficient documentation

## 2020-07-07 DIAGNOSIS — G894 Chronic pain syndrome: Secondary | ICD-10-CM | POA: Diagnosis present

## 2020-07-07 DIAGNOSIS — M4316 Spondylolisthesis, lumbar region: Secondary | ICD-10-CM | POA: Diagnosis present

## 2020-07-07 DIAGNOSIS — G8929 Other chronic pain: Secondary | ICD-10-CM | POA: Insufficient documentation

## 2020-07-07 DIAGNOSIS — M47816 Spondylosis without myelopathy or radiculopathy, lumbar region: Secondary | ICD-10-CM | POA: Diagnosis present

## 2020-07-07 DIAGNOSIS — M48062 Spinal stenosis, lumbar region with neurogenic claudication: Secondary | ICD-10-CM | POA: Insufficient documentation

## 2020-07-07 DIAGNOSIS — M2428 Disorder of ligament, vertebrae: Secondary | ICD-10-CM | POA: Insufficient documentation

## 2020-07-07 DIAGNOSIS — R937 Abnormal findings on diagnostic imaging of other parts of musculoskeletal system: Secondary | ICD-10-CM | POA: Insufficient documentation

## 2020-07-07 DIAGNOSIS — Z5189 Encounter for other specified aftercare: Secondary | ICD-10-CM

## 2020-07-07 DIAGNOSIS — Z7901 Long term (current) use of anticoagulants: Secondary | ICD-10-CM | POA: Diagnosis present

## 2020-07-07 DIAGNOSIS — M21372 Foot drop, left foot: Secondary | ICD-10-CM | POA: Insufficient documentation

## 2020-07-07 DIAGNOSIS — M5442 Lumbago with sciatica, left side: Secondary | ICD-10-CM | POA: Insufficient documentation

## 2020-07-07 DIAGNOSIS — Z6841 Body Mass Index (BMI) 40.0 and over, adult: Secondary | ICD-10-CM | POA: Diagnosis present

## 2020-07-07 DIAGNOSIS — M79604 Pain in right leg: Secondary | ICD-10-CM

## 2020-07-07 DIAGNOSIS — Z79899 Other long term (current) drug therapy: Secondary | ICD-10-CM | POA: Insufficient documentation

## 2020-07-07 DIAGNOSIS — M48061 Spinal stenosis, lumbar region without neurogenic claudication: Secondary | ICD-10-CM | POA: Insufficient documentation

## 2020-07-07 MED ORDER — SODIUM CHLORIDE 0.9% FLUSH
2.0000 mL | Freq: Once | INTRAVENOUS | Status: AC
  Administered 2020-07-07: 2 mL

## 2020-07-07 MED ORDER — SODIUM CHLORIDE (PF) 0.9 % IJ SOLN
INTRAMUSCULAR | Status: AC
  Filled 2020-07-07: qty 10

## 2020-07-07 MED ORDER — IOHEXOL 180 MG/ML  SOLN
10.0000 mL | Freq: Once | INTRAMUSCULAR | Status: AC
  Administered 2020-07-07: 10 mL via EPIDURAL

## 2020-07-07 MED ORDER — TRIAMCINOLONE ACETONIDE 40 MG/ML IJ SUSP
INTRAMUSCULAR | Status: AC
  Filled 2020-07-07: qty 1

## 2020-07-07 MED ORDER — OXYCODONE HCL 5 MG PO TABS: 5.0000 mg | ORAL_TABLET | Freq: Four times a day (QID) | ORAL | 0 refills | Status: DC | PRN

## 2020-07-07 MED ORDER — LIDOCAINE HCL 2 % IJ SOLN
20.0000 mL | Freq: Once | INTRAMUSCULAR | Status: AC
Start: 2020-07-07 — End: 2020-07-07
  Administered 2020-07-07: 200 mg

## 2020-07-07 MED ORDER — ROPIVACAINE HCL 2 MG/ML IJ SOLN
INTRAMUSCULAR | Status: AC
  Filled 2020-07-07: qty 20

## 2020-07-07 MED ORDER — TRIAMCINOLONE ACETONIDE 40 MG/ML IJ SUSP
40.0000 mg | Freq: Once | INTRAMUSCULAR | Status: AC
  Administered 2020-07-07: 40 mg

## 2020-07-07 MED ORDER — ROPIVACAINE HCL 2 MG/ML IJ SOLN
2.0000 mL | Freq: Once | INTRAMUSCULAR | Status: AC
  Administered 2020-07-07: 2 mL via EPIDURAL

## 2020-07-07 MED ORDER — LIDOCAINE HCL 2 % IJ SOLN
INTRAMUSCULAR | Status: AC
  Filled 2020-07-07: qty 10

## 2020-07-07 NOTE — Progress Notes (Signed)
PROVIDER NOTE: Information contained herein reflects review and annotations entered in association with encounter. Interpretation of such information and data should be left to medically-trained personnel. Information provided to patient can be located elsewhere in the medical record under "Patient Instructions". Document created using STT-dictation technology, any transcriptional errors that may result from process are unintentional.    Patient: Laura [name]  Service Category: Procedure  Provider: Oswaldo Done, MD  DOB: 09-05-1945  DOS: 07/07/2020  Location: [facility]  MRN: [id number]  Setting: Ambulatory - outpatient  Referring Provider: Lynnea Ferrier, MD  Type: Established Patient  Specialty: Interventional Pain Management  PCP: Laura Ferrier, MD   Primary Reason for Visit: Interventional Pain Management Treatment. CC: Back Pain (lower)  Procedure:          Anesthesia, Analgesia, Anxiolysis:  Type: Therapeutic Inter-Laminar Epidural Steroid Injection  #3  Region: Lumbar Level: L2-3 Level. Laterality: Left         Type: Moderate (Conscious) Sedation combined with Local Anesthesia Indication(s): Analgesia and Anxiety Route: Intravenous (IV) IV Access: Secured Sedation: Meaningful verbal contact was maintained at all times during the procedure  Local Anesthetic: Lidocaine 1-2%  Position: Prone with head of the table was raised to facilitate breathing.   Indications: 1. DDD (degenerative disc disease), lumbosacral   2. Chronic low back pain (1ry area of Pain) (Bilateral) (L>R) w/ sciatica (Bilateral)   3. Lumbar spondylosis   4. 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 Lumbar Grade 1 Anterolisthesis of L3/L4 and L4/L5   6. 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 Chronic pain of lower extremity (Bilateral)   9. 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 Abnormal MRI, lumbar spine (12/26/2019)   11. Body mass index (BMI) of 40.0-44.9 in adult (HCC)   12. Chronic anticoagulation (Plaquenil)    Pain Score: Pre-procedure: 8 /10 Post-procedure: 8 /10   Pharmacotherapy Assessment   Analgesic: Oxycodone IR 5 mg every 6 hours (20 mg/day) MME/day: 30 mg/day.   Monitoring: [place] PMP: PDMP reviewed during this encounter.       Pharmacotherapy: No side-effects or adverse reactions reported. Compliance: No problems identified. Effectiveness: Clinically acceptable.  UDS:  Summary  Date Value Ref Range Status  07/16/2019 Note  Final    Comment:    ==================================================================== ToxASSURE Select 13 (MW) ==================================================================== Test                             Result       Flag       Units  Drug Present and Declared for Prescription Verification   Oxycodone                      1635         EXPECTED   ng/mg creat   Oxymorphone                    521          EXPECTED   ng/mg creat   Noroxycodone                   1941         EXPECTED   ng/mg creat   Noroxymorphone  174          EXPECTED   ng/mg creat    Sources of oxycodone are scheduled prescription medications.    Oxymorphone, noroxycodone, and noroxymorphone are expected    metabolites of oxycodone. Oxymorphone is also available as a    scheduled prescription medication.  Drug Present not Declared for Prescription Verification   Tramadol                       136          UNEXPECTED ng/mg creat   O-Desmethyltramadol            53           UNEXPECTED ng/mg creat   N-Desmethyltramadol            77           UNEXPECTED ng/mg creat    Source of tramadol is a prescription medication. O-desmethyltramadol    and N-desmethyltramadol are expected metabolites of tramadol.  ==================================================================== Test                       Result    Flag   Units      Ref Range   Creatinine              103              mg/dL      >=28 ==================================================================== Declared Medications:  The flagging and interpretation on this report are based on the  following declared medications.  Unexpected results may arise from  inaccuracies in the declared medications.   **Note: The testing scope of this panel includes these medications:   Oxycodone   **Note: The testing scope of this panel does not include the  following reported medications:   Albuterol  Amlodipine  Aspirin  Azelastine  Chlorthalidone  Duloxetine  Folic Acid  Hydroxychloroquine (Plaquenil)  Infliximab (Remicade)  Levothyroxine  Loratadine (Claritin)  Methotrexate  Metoprolol  Mirtazapine  Multivitamin  Rosuvastatin  Simvastatin (Zocor)  Spironolactone  Valacyclovir (Valtrex) ==================================================================== For clinical consultation, please call [phone]. ====================================================================      Pre-op H&P Assessment:  Laura [name] is a 75 y.o. (year old), female patient, seen today for interventional treatment. She  has a past surgical history that includes Abdominal hysterectomy; Cesarean section; Replacement total knee (Left); and Hip surgery (Right). Laura [name] has a current medication list which includes the following prescription(s): albuterol, aspirin ec, azelastine, chlorthalidone, vitamin d, duloxetine, empagliflozin, folic acid, hydroxychloroquine, hydroxyzine, infliximab, levothyroxine, levothyroxine, loratadine, methotrexate, metoprolol succinate, multi-vitamins, rosuvastatin, spironolactone, valacyclovir, zaleplon, [START ON 07/13/2020] oxycodone, [START ON 08/12/2020] oxycodone, and [START ON 09/11/2020] oxycodone. Her primarily concern today is the Back Pain (lower)  Initial Vital Signs:  Pulse/HCG Rate: (!) 105ECG Heart Rate:  95 Temp: (!) 97 F (36.1 C) Resp: 18 BP: 133/88 SpO2: 98 %  BMI: Estimated body mass index is 41.16 kg/m as calculated from the following:   Height as of this encounter:  (1.676 m).   Weight as of this encounter: 255 lb (115.7 kg).  Risk Assessment: Allergies: Reviewed. She is allergic to bupropion and penicillins.  Allergy Precautions: None required Coagulopathies: Reviewed. None identified.  Blood-thinner therapy: None at this time Active Infection(s): Reviewed. None identified. Ms. Mcclard is afebrile  Site Confirmation: Ms. Beynon was asked to confirm the procedure and laterality before marking the site Procedure checklist: Completed Consent: Before the procedure and under the influence  of no sedative(s), amnesic(s), or anxiolytics, the patient was informed of the treatment options, risks and possible complications. To fulfill our ethical and legal obligations, as recommended by the American Medical Association's Code of Ethics, I have informed the patient of my clinical impression; the nature and purpose of the treatment or procedure; the risks, benefits, and possible complications of the intervention; the alternatives, including doing nothing; the risk(s) and benefit(s) of the alternative treatment(s) or procedure(s); and the risk(s) and benefit(s) of doing nothing. The patient was provided information about the general risks and possible complications associated with the procedure. These may include, but are not limited to: failure to achieve desired goals, infection, bleeding, organ or nerve damage, allergic reactions, paralysis, and death. In addition, the patient was informed of those risks and complications associated to Spine-related procedures, such as failure to decrease pain; infection (i.e.: Meningitis, epidural or intraspinal abscess); bleeding (i.e.: epidural hematoma, subarachnoid hemorrhage, or any other type of intraspinal or peri-dural bleeding); organ or nerve  damage (i.e.: Any type of peripheral nerve, nerve root, or spinal cord injury) with subsequent damage to sensory, motor, and/or autonomic systems, resulting in permanent pain, numbness, and/or weakness of one or several areas of the body; allergic reactions; (i.e.: anaphylactic reaction); and/or death. Furthermore, the patient was informed of those risks and complications associated with the medications. These include, but are not limited to: allergic reactions (i.e.: anaphylactic or anaphylactoid reaction(s)); adrenal axis suppression; blood sugar elevation that in diabetics may result in ketoacidosis or comma; water retention that in patients with history of congestive heart failure may result in shortness of breath, pulmonary edema, and decompensation with resultant heart failure; weight gain; swelling or edema; medication-induced neural toxicity; particulate matter embolism and blood vessel occlusion with resultant organ, and/or nervous system infarction; and/or aseptic necrosis of one or more joints. Finally, the patient was informed that Medicine is not an exact science; therefore, there is also the possibility of unforeseen or unpredictable risks and/or possible complications that may result in a catastrophic outcome. The patient indicated having understood very clearly. We have given the patient no guarantees and we have made no promises. Enough time was given to the patient to ask questions, all of which were answered to the patient's satisfaction. Ms. Rude has indicated that she wanted to continue with the procedure. Attestation: I, the ordering provider, attest that I have discussed with the patient the benefits, risks, side-effects, alternatives, likelihood of achieving goals, and potential problems during recovery for the procedure that I have provided informed consent. Date  Time: 07/07/2020 11:15 AM  Pre-Procedure Preparation:  Monitoring: As per clinic protocol. Respiration, ETCO2, SpO2,  BP, heart rate and rhythm monitor placed and checked for adequate function Safety Precautions: Patient was assessed for positional comfort and pressure points before starting the procedure. Time-out: I initiated and conducted the "Time-out" before starting the procedure, as per protocol. The patient was asked to participate by confirming the accuracy of the "Time Out" information. Verification of the correct person, site, and procedure were performed and confirmed by me, the nursing staff, and the patient. "Time-out" conducted as per Joint Commission's Universal Protocol (UP.01.01.01). Time: 1149  Description of Procedure:          Target Area: The interlaminar space, initially targeting the lower laminar border of the superior vertebral body. Approach: Paramedial approach. Area Prepped: Entire Posterior Lumbar Region DuraPrep (Iodine Povacrylex [0.7% available iodine] and Isopropyl Alcohol, 74% w/w) Safety Precautions: Aspiration looking for blood return was conducted prior to  all injections. At no point did we inject any substances, as a needle was being advanced. No attempts were made at seeking any paresthesias. Safe injection practices and needle disposal techniques used. Medications properly checked for expiration dates. SDV (single dose vial) medications used. Description of the Procedure: Protocol guidelines were followed. The procedure needle was introduced through the skin, ipsilateral to the reported pain, and advanced to the target area. Bone was contacted and the needle walked caudad, until the lamina was cleared. The epidural space was identified using "loss-of-resistance technique" with 2-3 ml of PF-NaCl (0.9% NSS), in a 5cc LOR glass syringe.  Vitals:   07/07/20 1146 07/07/20 1151 07/07/20 1156 07/07/20 1202  BP: (!) 173/111 (!) 191/94 (!) 201/114 (!) 164/87  Pulse:      Resp: 16 16 18 20   Temp:      TempSrc:      SpO2: 100% 99% 99% 99%  Weight:      Height:        Start  Time: 1149 hrs. End Time: 1201 hrs.  Materials:  Needle(s) Type: Epidural needle Gauge: 17G Length: 3.5-in Medication(s): Please see orders for medications and dosing details.  Imaging Guidance (Spinal):          Type of Imaging Technique: Fluoroscopy Guidance (Spinal) Indication(s): Assistance in needle guidance and placement for procedures requiring needle placement in or near specific anatomical locations not easily accessible without such assistance. Exposure Time: Please see nurses notes. Contrast: Before injecting any contrast, we confirmed that the patient did not have an allergy to iodine, shellfish, or radiological contrast. Once satisfactory needle placement was completed at the desired level, radiological contrast was injected. Contrast injected under live fluoroscopy. No contrast complications. See chart for type and volume of contrast used. Fluoroscopic Guidance: I was personally present during the use of fluoroscopy. "Tunnel Vision Technique" used to obtain the best possible view of the target area. Parallax error corrected before commencing the procedure. "Direction-depth-direction" technique used to introduce the needle under continuous pulsed fluoroscopy. Once target was reached, antero-posterior, oblique, and lateral fluoroscopic projection used confirm needle placement in all planes. Images permanently stored in EMR. Interpretation: I personally interpreted the imaging intraoperatively. Adequate needle placement confirmed in multiple planes. Appropriate spread of contrast into desired area was observed. No evidence of afferent or efferent intravascular uptake. No intrathecal or subarachnoid spread observed. Permanent images saved into the patient's record. Abnormal looking L4 vertebral body. Possible fracture.   Antibiotic Prophylaxis:   Anti-infectives (From admission, onward)    None      Indication(s): None identified  Post-operative Assessment:  Post-procedure Vital  Signs:  Pulse/HCG Rate: (!) 10595 Temp: (!) 97 F (36.1 C) Resp: 20 BP: (!) 164/87 SpO2: 99 %  EBL: None  Complications: No immediate post-treatment complications observed by team, or reported by patient.  Note: The patient tolerated the entire procedure well. A repeat set of vitals were taken after the procedure and the patient was kept under observation following institutional policy, for this type of procedure. Post-procedural neurological assessment was performed, showing return to baseline, prior to discharge. The patient was provided with post-procedure discharge instructions, including a section on how to identify potential problems. Should any problems arise concerning this procedure, the patient was given instructions to immediately contact us, at any time, without hesitation. In any case, we plan to contact the patient by telephone for a follow-up status report regarding this interventional procedure.  Comments:  No additional relevant information.  Plan of Care  Orders:  Orders Placed This Encounter  Procedures   Lumbar Epidural Injection    Scheduling Instructions:     Procedure: Interlaminar LESI L2-3     Laterality: Midline     Sedation: No Sedation     Timeframe:  Today    Order Specific Question:   Where will this procedure be performed?    Answer:   [facility]   DG PAIN CLINIC C-ARM 1-60 MIN NO REPORT    Intraoperative interpretation by procedural physician at [facility].    Standing Status:   Standing    Number of Occurrences:   1    Order Specific Question:   Reason for exam:    Answer:   Assistance in needle guidance and placement for procedures requiring needle placement in or near specific anatomical locations not easily accessible without such assistance.   DG Lumbar Spine Complete W/Bend    Patient presents with axial pain with possible radicular component. (Left L5/S1) Had bad fall in April 2022 after which back pain worsened and left  leg went totally numb. Recent LESI under fluoro seems to show a possible L4 compression fracture. Please evaluate. In addition to any acute findings, please report on:  1. Facet (Zygapophyseal) joint DJD (Hypertrophy, space narrowing, subchondral sclerosis, and/or osteophyte formation) 2. DDD and/or IVDD (Loss of disc height, desiccation or "Black disc disease") 3. Pars defects 4. Spondylolisthesis, spondylosis, and/or spondyloarthropathies (include Degree/Grade of displacement in mm) 5. Vertebral body Fractures, including age (old, new/acute) 6. Modic Type Changes 7. Demineralization 8. Bone pathology 9. Central, Lateral Recess, and/or Foraminal Stenosis (include AP diameter of stenosis in mm) 10. Surgical changes (hardware type, status, and presence of fibrosis)  NOTE: Please specify level(s) and laterality. If applicable: Please indicate ROM and/or evidence of instability (>49mm displacement between flexion and extension views)    Standing Status:   Future    Standing Expiration Date:   08/06/2020    Scheduling Instructions:     Imaging must be [name] as soon as possible. Inform patient that order will expire within 30 days and I will not renew it.    Order Specific Question:   Reason for Exam (SYMPTOM  OR DIAGNOSIS REQUIRED)    Answer:   Low back pain    Order Specific Question:   Preferred imaging location?    Answer:   [facility]    Order Specific Question:   Call Results- Best Contact Number?    Answer:   [phone] [facility])    Order Specific Question:   Radiology Contrast Protocol - do NOT remove file path    Answer:   [email or web address]    Order Specific Question:   Release to patient    Answer:   Immediate   ToxASSURE Select 13 (MW), Urine    Volume: 30 ml(s). Minimum 3 ml of urine is needed. Document temperature of fresh sample. Indications: Long term (current) use of opiate analgesic (Z79.891)    Order Specific  Question:   Release to patient    Answer:   Immediate   Ambulatory referral to Neurosurgery    Referral Priority:   Routine    Referral Type:   Surgical    Referral Reason:   Specialty Services Required    Referred to Provider:   [facility]    Requested Specialty:   Neurosurgery    Number of Visits Requested:   1   Informed Consent Details: Physician/Practitioner Attestation; Transcribe to consent  form and obtain patient signature    Note: Always confirm laterality of pain with Ms. Rizzolo, before procedure. Transcribe to consent form and obtain patient signature.    Order Specific Question:   Physician/Practitioner attestation of informed consent for procedure/surgical case    Answer:   I, the physician/practitioner, attest that I have discussed with the patient the benefits, risks, side effects, alternatives, likelihood of achieving goals and potential problems during recovery for the procedure that I have provided informed consent.    Order Specific Question:   Procedure    Answer:   Lumbar epidural steroid injection under fluoroscopic guidance    Order Specific Question:   Physician/Practitioner performing the procedure    Answer:   [place] A. Laban Emperor, MD    Order Specific Question:   Indication/Reason    Answer:   Low back and/or lower extremity pain secondary to lumbar radiculitis   Care order/instruction: Please confirm that the patient has stopped the Plaquenil (Hydroxychloroquine) x 11 days prior to procedure or surgery.    Please confirm that the patient has stopped the Plaquenil (Hydroxychloroquine) x 11 days prior to procedure or surgery.    Standing Status:   Standing    Number of Occurrences:   1   Provide equipment / supplies at bedside    "Epidural Tray" (Disposable  single use) Catheter: NOT required    Standing Status:   Standing    Number of Occurrences:   1    Order Specific Question:   Specify    Answer:   Epidural Tray    Nursing Instructions:    1). STAT: UDS required today. 2). Make sure to document all opioids and benzodiazepines taken, including time of last intake. 3). If order is entered on a procedure day, make sure sample is obtained before any medications are administered.   Bleeding precautions    Standing Status:   Standing    Number of Occurrences:   1    Chronic Opioid Analgesic:  Oxycodone IR 5 mg every 6 hours (20 mg/day) MME/day: 30 mg/day.   Medications ordered for procedure: Meds ordered this encounter  Medications   iohexol (OMNIPAQUE) 180 MG/ML injection 10 mL    Must be Myelogram-compatible. If not available, you may substitute with a water-soluble, non-ionic, hypoallergenic, myelogram-compatible radiological contrast medium.   lidocaine (XYLOCAINE) 2 % (with pres) injection 400 mg   sodium chloride flush (NS) 0.9 % injection 2 mL   ropivacaine (PF) 2 mg/mL (0.2%) (NAROPIN) injection 2 mL   triamcinolone acetonide (KENALOG-40) injection 40 mg   oxyCODONE (OXY IR/ROXICODONE) 5 MG immediate release tablet    Sig: Take 1 tablet (5 mg total) by mouth every 6 (six) hours as needed for severe pain. Must last 30 days    Dispense:  120 tablet    Refill:  0    Not a duplicate. Do NOT delete! Dispense 1 day early if closed on fill date. Warn not to take CNS-depressants 8 hours before or after taking opioid. Do not send refill request. Renewal requires appointment.   oxyCODONE (OXY IR/ROXICODONE) 5 MG immediate release tablet    Sig: Take 1 tablet (5 mg total) by mouth every 6 (six) hours as needed for severe pain. Must last 30 days    Dispense:  120 tablet    Refill:  0    Not a duplicate. Do NOT delete! Dispense 1 day early if closed on fill date. Warn not to take CNS-depressants 8 hours before or after taking  opioid. Do not send refill request. Renewal requires appointment.   oxyCODONE (OXY IR/ROXICODONE) 5 MG immediate release tablet    Sig: Take 1 tablet (5 mg total) by mouth every 6  (six) hours as needed for severe pain. Must last 30 days    Dispense:  120 tablet    Refill:  0    Not a duplicate. Do NOT delete! Dispense 1 day early if closed on fill date. Warn not to take CNS-depressants 8 hours before or after taking opioid. Do not send refill request. Renewal requires appointment.    Medications administered: We administered iohexol, lidocaine, sodium chloride flush, ropivacaine (PF) 2 mg/mL (0.2%), and triamcinolone acetonide.  See the medical record for exact dosing, route, and time of administration.  Follow-up plan:   Return in about 2 weeks (around 07/21/2020) for procedure day (afternoon VV) (PPE).      Interventional Therapies  Risk  Complexity Considerations:   Estimated body mass index is 41.16 kg/m as calculated from the following:   Height as of this encounter: 5\' 6"  (1.676 m).   Weight as of this encounter: 255 lb (115.7 kg). PLAQUENIL Anticoagulation (Stop: 11 days)   Planned  Pending:   Therapeutic midline L2-3 LESI #2 (05/28/2020)    Under consideration:   Diagnostic bilateral genicular NB  Diagnostic bilateral L3 and/or L5 TFESI    Completed:   Therapeutic right IA steroid knee injection x2 (10/15/2015)  Therapeutic right Hyalgan knee injection x1 (09/01/2016)  Therapeutic left lumbar facet block x1 (01/22/2015)  Therapeutic left IA hip injection x1 (03/17/2015)  Therapeutic left L4-5 LESI x1 (02/04/2020)  Therapeutic midline L2-3 LESI x1 (04/14/2020)    Therapeutic  Palliative (PRN) options:   Therapeutic right IA Hyalgan knee injections Palliative left IA hip joint injection  Diagnostic left lumbar facet block #2      Recent Visits Date Type Provider Dept  06/16/20 Telemedicine 08/16/20, MD [facility]  05/28/20 Procedure visit 05/30/20, MD [facility]  05/05/20 Telemedicine 05/07/20, MD [facility]  04/14/20 Procedure visit 06/14/20, MD [facility]  Showing  recent visits within past 90 days and meeting all other requirements Today's Visits Date Type Provider Dept  07/07/20 Procedure visit 07/09/20, MD [facility]  Showing today's visits and meeting all other requirements Future Appointments Date Type Provider Dept  07/22/20 Appointment 07/24/20, MD [facility]  Showing future appointments within next 90 days and meeting all other requirements Disposition: Discharge home  Discharge (Date  Time): 07/07/2020; 1215 hrs.   Primary Care Physician: 07/09/2020, MD Location: [facility] Note by: [facility], MD Date: 07/07/2020; Time: 7:07 PM  Disclaimer:  Medicine is not an 07/09/2020. The only guarantee in medicine is that nothing is guaranteed. It is important to note that the decision to proceed with this intervention was based on the information collected from the patient. The Data and conclusions were drawn from the patient's questionnaire, the interview, and the physical examination. Because the information was provided in large part by the patient, it cannot be guaranteed that it has not been purposely or unconsciously manipulated. Every effort has been made to obtain as much relevant data as possible for this evaluation. It is important to note that the conclusions that lead to this procedure are derived in large part from the available data. Always take into account that the treatment will also be dependent on availability of resources  and existing treatment guidelines, considered by other Pain Management Practitioners as being common knowledge and practice, at the time of the intervention. For Medico-Legal purposes, it is also important to point out that variation in procedural techniques and pharmacological choices are the acceptable norm. The indications, contraindications, technique, and results of the above procedure should only be interpreted and judged by a  Board-Certified Interventional Pain Specialist with extensive familiarity and expertise in the same exact procedure and technique.

## 2020-07-07 NOTE — Patient Instructions (Addendum)
____________________________________________________________________________________________  Post-Procedure Discharge Instructions  Instructions: Apply ice:  Purpose: This will minimize any swelling and discomfort after procedure.  When: Day of procedure, as soon as you get home. How: Fill a plastic sandwich bag with crushed ice. Cover it with a small towel and apply to injection site. How long: (15 min on, 15 min off) Apply for 15 minutes then remove x 15 minutes.  Repeat sequence on day of procedure, until you go to bed. Apply heat:  Purpose: To treat any soreness and discomfort from the procedure. When: Starting the next day after the procedure. How: Apply heat to procedure site starting the day following the procedure. How long: May continue to repeat daily, until discomfort goes away. Food intake: Start with clear liquids (like water) and advance to regular food, as tolerated.  Physical activities: Keep activities to a minimum for the first 8 hours after the procedure. After that, then as tolerated. Driving: If you have received any sedation, be responsible and do not drive. You are not allowed to drive for 24 hours after having sedation. Blood thinner: (Applies only to those taking blood thinners) You may restart your blood thinner 6 hours after your procedure. Insulin: (Applies only to Diabetic patients taking insulin) As soon as you can eat, you may resume your normal dosing schedule. Infection prevention: Keep procedure site clean and dry. Shower daily and clean area with soap and water. Post-procedure Pain Diary: Extremely important that this be done correctly and accurately. Recorded information will be used to determine the next step in treatment. For the purpose of accuracy, follow these rules: Evaluate only the area treated. Do not report or include pain from an untreated area. For the purpose of this evaluation, ignore all other areas of pain, except for the treated area. After  your procedure, avoid taking a long nap and attempting to complete the pain diary after you wake up. Instead, set your alarm clock to go off every hour, on the hour, for the initial 8 hours after the procedure. Document the duration of the numbing medicine, and the relief you are getting from it. Do not go to sleep and attempt to complete it later. It will not be accurate. If you received sedation, it is likely that you were given a medication that may cause amnesia. Because of this, completing the diary at a later time may cause the information to be inaccurate. This information is needed to plan your care. Follow-up appointment: Keep your post-procedure follow-up evaluation appointment after the procedure (usually 2 weeks for most procedures, 6 weeks for radiofrequencies). DO NOT FORGET to bring you pain diary with you.   Expect: (What should I expect to see with my procedure?) From numbing medicine (AKA: Local Anesthetics): Numbness or decrease in pain. You may also experience some weakness, which if present, could last for the duration of the local anesthetic. Onset: Full effect within 15 minutes of injected. Duration: It will depend on the type of local anesthetic used. On the average, 1 to 8 hours.  From steroids (Applies only if steroids were used): Decrease in swelling or inflammation. Once inflammation is improved, relief of the pain will follow. Onset of benefits: Depends on the amount of swelling present. The more swelling, the longer it will take for the benefits to be seen. In some cases, up to 10 days. Duration: Steroids will stay in the system x 2 weeks. Duration of benefits will depend on multiple posibilities including persistent irritating factors. Side-effects: If present, they  may typically last 2 weeks (the duration of the steroids). Frequent: Cramps (if they occur, drink Gatorade and take over-the-counter Magnesium 450-500 mg once to twice a day); water retention with temporary  weight gain; increases in blood sugar; decreased immune system response; increased appetite. Occasional: Facial flushing (red, warm cheeks); mood swings; menstrual changes. Uncommon: Long-term decrease or suppression of natural hormones; bone thinning. (These are more common with higher doses or more frequent use. This is why we prefer that our patients avoid having any injection therapies in other practices.)  Very Rare: Severe mood changes; psychosis; aseptic necrosis. From procedure: Some discomfort is to be expected once the numbing medicine wears off. This should be minimal if ice and heat are applied as instructed.  Call if: (When should I call?) You experience numbness and weakness that gets worse with time, as opposed to wearing off. New onset bowel or bladder incontinence. (Applies only to procedures done in the spine)  Emergency Numbers: Durning business hours (Monday - Thursday, 8:00 AM - 4:00 PM) (Friday, 9:00 AM - 12:00 Noon): [phone] After hours: [phone] NOTE: If you are having a problem and are unable connect with, or to talk to a provider, then go to your nearest urgent care or emergency department. If the problem is serious and urgent, please call 911. ____________________________________________________________________________________________  ____________________________________________________________________________________________  Medication Rules  Purpose: To inform patients, and their family members, of our rules and regulations.  Applies to: All patients receiving prescriptions (written or electronic).  Pharmacy of record: Pharmacy where electronic prescriptions will be sent. If written prescriptions are taken to a different pharmacy, please inform the nursing staff. The pharmacy listed in the electronic medical record should be the one where you would like electronic prescriptions to be sent.  Electronic prescriptions: In compliance with the [place] Strengthen Opioid Misuse Prevention (STOP) Act of 2017 (Session Law 2017-74/H243), effective January 10, 2018, all controlled substances must be electronically prescribed. Calling prescriptions to the pharmacy will cease to exist.  Prescription refills: Only during scheduled appointments. Applies to all prescriptions.  NOTE: The following applies primarily to controlled substances (Opioid* Pain Medications).   Type of encounter (visit): For patients receiving controlled substances, face-to-face visits are required. (Not an option or up to the patient.)  Patient's responsibilities: Pain Pills: Bring all pain pills to every appointment (except for procedure appointments). Pill Bottles: Bring pills in original pharmacy bottle. Always bring the newest bottle. Bring bottle, even if empty. Medication refills: You are responsible for knowing and keeping track of what medications you take and those you need refilled. The day before your appointment: write a list of all prescriptions that need to be refilled. The day of the appointment: give the list to the admitting nurse. Prescriptions will be written only during appointments. No prescriptions will be written on procedure days. If you forget a medication: it will not be "Called in", "Faxed", or "electronically sent". You will need to get another appointment to get these prescribed. No early refills. Do not call asking to have your prescription filled early. Prescription Accuracy: You are responsible for carefully inspecting your prescriptions before leaving our office. Have the discharge nurse carefully go over each prescription with you, before taking them home. Make sure that your name is accurately spelled, that your address is correct. Check the name and dose of your medication to make sure it is accurate. Check the number of pills, and the written instructions to make sure they are clear and accurate. Make   sure that you are given enough  medication to last until your next medication refill appointment. Taking Medication: Take medication as prescribed. When it comes to controlled substances, taking less pills or less frequently than prescribed is permitted and encouraged. Never take more pills than instructed. Never take medication more frequently than prescribed.  Inform other Doctors: Always inform, all of your healthcare providers, of all the medications you take. Pain Medication from other Providers: You are not allowed to accept any additional pain medication from any other Doctor or Healthcare provider. There are two exceptions to this rule. (see below) In the event that you require additional pain medication, you are responsible for notifying us, as stated below. Cough Medicine: Often these contain an opioid, such as codeine or hydrocodone. Never accept or take cough medicine containing these opioids if you are already taking an opioid* medication. The combination may cause respiratory failure and death. Medication Agreement: You are responsible for carefully reading and following our Medication Agreement. This must be signed before receiving any prescriptions from our practice. Safely store a copy of your signed Agreement. Violations to the Agreement will result in no further prescriptions. (Additional copies of our Medication Agreement are available upon request.) Laws, Rules, & Regulations: All patients are expected to follow all Federal and State Laws, Statutes, Rules, & Regulations. Ignorance of the Laws does not constitute a valid excuse.  Illegal drugs and Controlled Substances: The use of illegal substances (including, but not limited to marijuana and its derivatives) and/or the illegal use of any controlled substances is strictly prohibited. Violation of this rule may result in the immediate and permanent discontinuation of any and all prescriptions being written by our practice. The use of any illegal substances is  prohibited. Adopted CDC guidelines & recommendations: Target dosing levels will be at or below 60 MME/day. Use of benzodiazepines** is not recommended.  Exceptions: There are only two exceptions to the rule of not receiving pain medications from other Healthcare Providers. Exception #1 (Emergencies): In the event of an emergency (i.e.: accident requiring emergency care), you are allowed to receive additional pain medication. However, you are responsible for: As soon as you are able, call our office [phone], at any time of the day or night, and leave a message stating your name, the date and nature of the emergency, and the name and dose of the medication prescribed. In the event that your call is answered by a member of our staff, make sure to document and save the date, time, and the name of the person that took your information.  Exception #2 (Planned Surgery): In the event that you are scheduled by another doctor or dentist to have any type of surgery or procedure, you are allowed (for a period no longer than 30 days), to receive additional pain medication, for the acute post-op pain. However, in this case, you are responsible for picking up a copy of our "Post-op Pain Management for Surgeons" handout, and giving it to your surgeon or dentist. This document is available at our office, and does not require an appointment to obtain it. Simply go to our office during business hours (Monday-Thursday from 8:00 AM to 4:00 PM) (Friday 8:00 AM to 12:00 Noon) or if you have a scheduled appointment with us, prior to your surgery, and ask for it by name. In addition, you are responsible for: calling our office [phone], at any time of the day or night, and leaving a message stating your name, name of your   surgeon, type of surgery, and date of procedure or surgery. Failure to comply with your responsibilities may result in termination of therapy involving the controlled substances.  *Opioid medications  include: morphine, codeine, oxycodone, oxymorphone, hydrocodone, hydromorphone, meperidine, tramadol, tapentadol, buprenorphine, fentanyl, methadone. **Benzodiazepine medications include: diazepam (Valium), alprazolam (Xanax), clonazepam (Klonopine), lorazepam (Ativan), clorazepate (Tranxene), chlordiazepoxide (Librium), estazolam (Prosom), oxazepam (Serax), temazepam (Restoril), triazolam (Halcion) (Last updated: 12/09/2019) ____________________________________________________________________________________________  ____________________________________________________________________________________________  Medication Recommendations and Reminders  Applies to: All patients receiving prescriptions (written and/or electronic).  Medication Rules & Regulations: These rules and regulations exist for your safety and that of others. They are not flexible and neither are we. Dismissing or ignoring them will be considered "non-compliance" with medication therapy, resulting in complete and irreversible termination of such therapy. (See document titled "Medication Rules" for more details.) In all conscience, because of safety reasons, we cannot continue providing a therapy where the patient does not follow instructions.  Pharmacy of record:  Definition: This is the pharmacy where your electronic prescriptions will be sent.  We do not endorse any particular pharmacy, however, we have experienced problems with [facility] not securing enough medication supply for the community. We do not restrict you in your choice of pharmacy. However, once we write for your prescriptions, we will NOT be re-sending more prescriptions to fix restricted supply problems created by your pharmacy, or your insurance.  The pharmacy listed in the electronic medical record should be the one where you want electronic prescriptions to be sent. If you choose to change pharmacy, simply notify our nursing staff.  Recommendations: Keep all  of your pain medications in a safe place, under lock and key, even if you live alone. We will NOT replace lost, stolen, or damaged medication. After you fill your prescription, take 1 week's worth of pills and put them away in a safe place. You should keep a separate, properly labeled bottle for this purpose. The remainder should be kept in the original bottle. Use this as your primary supply, until it runs out. Once it's gone, then you know that you have 1 week's worth of medicine, and it is time to come in for a prescription refill. If you do this correctly, it is unlikely that you will ever run out of medicine. To make sure that the above recommendation works, it is very important that you make sure your medication refill appointments are scheduled at least 1 week before you run out of medicine. To do this in an effective manner, make sure that you do not leave the office without scheduling your next medication management appointment. Always ask the nursing staff to show you in your prescription , when your medication will be running out. Then arrange for the receptionist to get you a return appointment, at least 7 days before you run out of medicine. Do not wait until you have 1 or 2 pills left, to come in. This is very poor planning and does not take into consideration that we may need to cancel appointments due to bad weather, sickness, or emergencies affecting our staff. DO NOT ACCEPT A "Partial Fill": If for any reason your pharmacy does not have enough pills/tablets to completely fill or refill your prescription, do not allow for a "partial fill". The law allows the pharmacy to complete that prescription within 72 hours, without requiring a new prescription. If they do not fill the rest of your prescription within those 72 hours, you will need a separate prescription to fill the remaining amount, which  we will NOT provide. If the reason for the partial fill is your insurance, you will need to talk to the  pharmacist about payment alternatives for the remaining tablets, but again, DO NOT ACCEPT A PARTIAL FILL, unless you can trust your pharmacist to obtain the remainder of the pills within 72 hours.  Prescription refills and/or changes in medication(s):  Prescription refills, and/or changes in dose or medication, will be conducted only during scheduled medication management appointments. (Applies to both, written and electronic prescriptions.) No refills on procedure days. No medication will be changed or started on procedure days. No changes, adjustments, and/or refills will be conducted on a procedure day. Doing so will interfere with the diagnostic portion of the procedure. No phone refills. No medications will be "called into the pharmacy". No Fax refills. No weekend refills. No Holliday refills. No after hours refills.  Remember:  Business hours are:  Monday to Thursday 8:00 AM to 4:00 PM Provider's Schedule: Milinda Pointer, MD - Appointments are:  Medication management: Monday and Wednesday 8:00 AM to 4:00 PM Procedure day: Tuesday and Thursday 7:30 AM to 4:00 PM Gillis Santa, MD - Appointments are:  Medication management: Tuesday and Thursday 8:00 AM to 4:00 PM Procedure day: Monday and Wednesday 7:30 AM to 4:00 PM (Last update: 07/31/2019) ____________________________________________________________________________________________  ____________________________________________________________________________________________  CBD (cannabidiol) WARNING  Applicable to: All individuals currently taking or considering taking CBD (cannabidiol) and, more important, all patients taking opioid analgesic controlled substances (pain medication). (Example: oxycodone; oxymorphone; hydrocodone; hydromorphone; morphine; methadone; tramadol; tapentadol; fentanyl; buprenorphine; butorphanol; dextromethorphan; meperidine; codeine; etc.)  Legal status: CBD remains a Schedule I drug prohibited for  any use. CBD is illegal with one exception. In the [place], CBD has a limited Transport planner (FDA) approval for the treatment of two specific types of epilepsy disorders. Only one CBD product has been approved by the FDA for this purpose: "Epidiolex". FDA is aware that some companies are marketing products containing cannabis and cannabis-derived compounds in ways that violate the [employer], Drug and Cosmetic Act [facility] Act) and that may put the health and safety of consumers at risk. The FDA, a Federal agency, has not enforced the CBD status since 2018.   Legality: Some manufacturers ship CBD products nationally, which is illegal. Often such products are sold online and are therefore available throughout the country. CBD is openly sold in head shops and health food stores in some states where such sales have not been explicitly legalized. Selling unapproved products with unsubstantiated therapeutic claims is not only a violation of the law, but also can put patients at risk, as these products have not been proven to be safe or effective. Federal illegality makes it difficult to conduct research on CBD.  Reference: "FDA Regulation of Cannabis and Cannabis-Derived Products, Including Cannabidiol (CBD)" - [employer]  Warning: CBD is not FDA approved and has not undergo the same manufacturing controls as prescription drugs.  This means that the purity and safety of available CBD may be questionable. Most of the time, despite manufacturer's claims, it is contaminated with THC (delta-9-tetrahydrocannabinol - the chemical in marijuana responsible for the "HIGH").  When this is the case, the [facility] contaminant will trigger a positive urine drug screen (UDS) test for Marijuana (carboxy-THC). Because a positive UDS for any illicit substance is a violation of our medication  agreement, your opioid analgesics (pain medicine) may be permanently discontinued.  MORE ABOUT CBD  General Information: CBD  is a derivative of the Marijuana (cannabis  sativa) plant discovered in 68. It is one of the 113 identified substances found in Marijuana. It accounts for up to 40% of the plant's extract. As of 2018, preliminary clinical studies on CBD included research for the treatment of anxiety, movement disorders, and pain. CBD is available and consumed in multiple forms, including inhalation of smoke or vapor, as an aerosol spray, and by mouth. It may be supplied as an oil containing CBD, capsules, dried cannabis, or as a liquid solution. CBD is thought not to be as psychoactive as THC (delta-9-tetrahydrocannabinol - the chemical in marijuana responsible for the "HIGH"). Studies suggest that CBD may interact with different biological target receptors in the body, including cannabinoid and other neurotransmitter receptors. As of 2018 the mechanism of action for its biological effects has not been determined.  Side-effects  Adverse reactions: Dry mouth, diarrhea, decreased appetite, fatigue, drowsiness, malaise, weakness, sleep disturbances, and others.  Drug interactions: CBC may interact with other medications such as blood-thinners. (Last update: 08/17/2019) ____________________________________________________________________________________________  ____________________________________________________________________________________________  Drug Holidays (Slow)  What is a "Drug Holiday"? Drug Holiday: is the name given to the period of time during which a patient stops taking a medication(s) for the purpose of eliminating tolerance to the drug.  Benefits Improved effectiveness of opioids. Decreased opioid dose needed to achieve benefits. Improved pain with lesser dose.  What is tolerance? Tolerance: is the progressive decreased in effectiveness of a drug due to its  repetitive use. With repetitive use, the body gets use to the medication and as a consequence, it loses its effectiveness. This is a common problem seen with opioid pain medications. As a result, a larger dose of the drug is needed to achieve the same effect that used to be obtained with a smaller dose.  How long should a "Drug Holiday" last? You should stay off of the pain medicine for at least 14 consecutive days. (2 weeks)  Should I stop the medicine "cold [place]"? No. You should always coordinate with your Pain Specialist so that he/she can provide you with the correct medication dose to make the transition as smoothly as possible.  How do I stop the medicine? Slowly. You will be instructed to decrease the daily amount of pills that you take by one (1) pill every seven (7) days. This is called a "slow downward taper" of your dose. For example: if you normally take four (4) pills per day, you will be asked to drop this dose to three (3) pills per day for seven (7) days, then to two (2) pills per day for seven (7) days, then to one (1) per day for seven (7) days, and at the end of those last seven (7) days, this is when the "Drug Holiday" would start.   Will I have withdrawals? By doing a "slow downward taper" like this one, it is unlikely that you will experience any significant withdrawal symptoms. Typically, what triggers withdrawals is the sudden stop of a high dose opioid therapy. Withdrawals can usually be avoided by slowly decreasing the dose over a prolonged period of time. If you do not follow these instructions and decide to stop your medication abruptly, withdrawals may be possible.  What are withdrawals? Withdrawals: refers to the wide range of symptoms that occur after stopping or dramatically reducing opiate drugs after heavy and prolonged use. Withdrawal symptoms do not occur to patients that use low dose opioids, or those who take the medication sporadically. Contrary to  benzodiazepine (example: Valium, Xanax, etc.) or alcohol  withdrawals ("Delirium Tremens"), opioid withdrawals are not lethal. Withdrawals are the physical manifestation of the body getting rid of the excess receptors.  Expected Symptoms Early symptoms of withdrawal may include: Agitation Anxiety Muscle aches Increased tearing Insomnia Runny nose Sweating Yawning  Late symptoms of withdrawal may include: Abdominal cramping Diarrhea Dilated pupils Goose bumps Nausea Vomiting  Will I experience withdrawals? Due to the slow nature of the taper, it is very unlikely that you will experience any.  What is a slow taper? Taper: refers to the gradual decrease in dose.  (Last update: 07/31/2019) ____________________________________________________________________________________________   Pain Management Discharge Instructions  General Discharge Instructions :  If you need to reach your doctor call: Monday-Friday 8:00 am - 4:00 pm at [phone] or toll free [phone].  After clinic hours [phone] to have operator reach doctor.  Bring all of your medication bottles to all your appointments in the pain clinic.  To cancel or reschedule your appointment with Pain Management please remember to call 24 hours in advance to avoid a fee.  Refer to the educational materials which you have been given on: General Risks, I had my Procedure. Discharge Instructions, Post Sedation.  Post Procedure Instructions:  The drugs you were given will stay in your system until tomorrow, so for the next 24 hours you should not drive, make any legal decisions or drink any alcoholic beverages.  You may eat anything you prefer, but it is better to start with liquids then soups and crackers, and gradually work up to solid foods.  Please notify your doctor immediately if you have any unusual bleeding, trouble breathing or pain that is not related to your normal pain.  Depending on the type of  procedure that was done, some parts of your body may feel week and/or numb.  This usually clears up by tonight or the next day.  Walk with the use of an assistive device or accompanied by an adult for the 24 hours.  You may use ice on the affected area for the first 24 hours.  Put ice in a Ziploc bag and cover with a towel and place against area 15 minutes on 15 minutes off.  You may switch to heat after 24 hours.Epidural Steroid Injection Patient Information  Description: The epidural space surrounds the nerves as they exit the spinal cord.  In some patients, the nerves can be compressed and inflamed by a bulging disc or a tight spinal canal (spinal stenosis).  By injecting steroids into the epidural space, we can bring irritated nerves into direct contact with a potentially helpful medication.  These steroids act directly on the irritated nerves and can reduce swelling and inflammation which often leads to decreased pain.  Epidural steroids may be injected anywhere along the spine and from the neck to the low back depending upon the location of your pain.   After numbing the skin with local anesthetic (like Novocaine), a small needle is passed into the epidural space slowly.  You may experience a sensation of pressure while this is being done.  The entire block usually last less than 10 minutes.  Conditions which may be treated by epidural steroids:  Low back and leg pain Neck and arm pain Spinal stenosis Post-laminectomy syndrome Herpes zoster (shingles) pain Pain from compression fractures  Preparation for the injection:  Do not eat any solid food or dairy products within 8 hours of your appointment.  You may drink clear liquids up to 3 hours before appointment.  Clear liquids include  water, black coffee, juice or soda.  No milk or cream please. You may take your regular medication, including pain medications, with a sip of water before your appointment  Diabetics should hold regular  insulin (if taken separately) and take 1/2 normal NPH dos the morning of the procedure.  Carry some sugar containing items with you to your appointment. A driver must accompany you and be prepared to drive you home after your procedure.  Bring all your current medications with your. An IV may be inserted and sedation may be given at the discretion of the physician.   A blood pressure cuff, EKG and other monitors will often be applied during the procedure.  Some patients may need to have extra oxygen administered for a short period. You will be asked to provide medical information, including your allergies, prior to the procedure.  We must know immediately if you are taking blood thinners (like Coumadin/Warfarin)  Or if you are allergic to IV iodine contrast (dye). We must know if you could possible be pregnant.  Possible side-effects: Bleeding from needle site Infection (rare, may require surgery) Nerve injury (rare) Numbness & tingling (temporary) Difficulty urinating (rare, temporary) Spinal headache ( a headache worse with upright posture) Light -headedness (temporary) Pain at injection site (several days) Decreased blood pressure (temporary) Weakness in arm/leg (temporary) Pressure sensation in back/neck (temporary)  Call if you experience: Fever/chills associated with headache or increased back/neck pain. Headache worsened by an upright position. New onset weakness or numbness of an extremity below the injection site Hives or difficulty breathing (go to the emergency room) Inflammation or drainage at the infection site Severe back/neck pain Any new symptoms which are concerning to you  Please note:  Although the local anesthetic injected can often make your back or neck feel good for several hours after the injection, the pain will likely return.  It takes 3-7 days for steroids to work in the epidural space.  You may not notice any pain relief for at least that one week.  If  effective, we will often do a series of three injections spaced 3-6 weeks apart to maximally decrease your pain.  After the initial series, we generally will wait several months before considering a repeat injection of the same type.  If you have any questions, please call [phone] [facility]

## 2020-07-07 NOTE — Progress Notes (Signed)
Safety precautions to be maintained throughout the outpatient stay will include: orient to surroundings, keep bed in low position, maintain call bell within reach at all times, provide assistance with transfer out of bed and ambulation.  

## 2020-07-08 ENCOUNTER — Telehealth: Payer: Self-pay

## 2020-07-08 NOTE — Telephone Encounter (Signed)
Called PP, No answer, left message to call if needed.

## 2020-07-09 ENCOUNTER — Ambulatory Visit
Admission: RE | Admit: 2020-07-09 | Discharge: 2020-07-09 | Disposition: A | Discharge status: Home / Self Care | Payer: Medicare Other | Source: Ambulatory Visit | Attending: Pain Medicine | Admitting: Pain Medicine

## 2020-07-09 ENCOUNTER — Other Ambulatory Visit: Payer: Self-pay | Admitting: Pain Medicine

## 2020-07-09 DIAGNOSIS — M47816 Spondylosis without myelopathy or radiculopathy, lumbar region: Secondary | ICD-10-CM | POA: Diagnosis present

## 2020-07-09 DIAGNOSIS — M2428 Disorder of ligament, vertebrae: Secondary | ICD-10-CM | POA: Insufficient documentation

## 2020-07-09 DIAGNOSIS — M48062 Spinal stenosis, lumbar region with neurogenic claudication: Secondary | ICD-10-CM | POA: Diagnosis present

## 2020-07-09 DIAGNOSIS — Z6841 Body Mass Index (BMI) 40.0 and over, adult: Secondary | ICD-10-CM | POA: Insufficient documentation

## 2020-07-09 DIAGNOSIS — G8929 Other chronic pain: Secondary | ICD-10-CM | POA: Insufficient documentation

## 2020-07-09 DIAGNOSIS — G894 Chronic pain syndrome: Secondary | ICD-10-CM | POA: Diagnosis present

## 2020-07-09 DIAGNOSIS — M5410 Radiculopathy, site unspecified: Secondary | ICD-10-CM | POA: Insufficient documentation

## 2020-07-09 DIAGNOSIS — M79605 Pain in left leg: Secondary | ICD-10-CM

## 2020-07-09 DIAGNOSIS — M5442 Lumbago with sciatica, left side: Secondary | ICD-10-CM

## 2020-07-09 DIAGNOSIS — M5137 Other intervertebral disc degeneration, lumbosacral region: Secondary | ICD-10-CM

## 2020-07-09 DIAGNOSIS — M48061 Spinal stenosis, lumbar region without neurogenic claudication: Secondary | ICD-10-CM

## 2020-07-09 DIAGNOSIS — M51379 Other intervertebral disc degeneration, lumbosacral region without mention of lumbar back pain or lower extremity pain: Secondary | ICD-10-CM

## 2020-07-09 DIAGNOSIS — Z79899 Other long term (current) drug therapy: Secondary | ICD-10-CM | POA: Diagnosis present

## 2020-07-09 DIAGNOSIS — M4316 Spondylolisthesis, lumbar region: Secondary | ICD-10-CM

## 2020-07-09 DIAGNOSIS — Z5189 Encounter for other specified aftercare: Secondary | ICD-10-CM

## 2020-07-09 DIAGNOSIS — Z7901 Long term (current) use of anticoagulants: Secondary | ICD-10-CM

## 2020-07-09 DIAGNOSIS — M79604 Pain in right leg: Secondary | ICD-10-CM | POA: Insufficient documentation

## 2020-07-09 DIAGNOSIS — R937 Abnormal findings on diagnostic imaging of other parts of musculoskeletal system: Secondary | ICD-10-CM

## 2020-07-09 DIAGNOSIS — M5441 Lumbago with sciatica, right side: Secondary | ICD-10-CM | POA: Insufficient documentation

## 2020-07-09 DIAGNOSIS — Z79891 Long term (current) use of opiate analgesic: Secondary | ICD-10-CM

## 2020-07-09 IMAGING — CR DG LUMBAR SPINE COMPLETE W/ BEND
1 series · 7 of 7 positions shown · non-contrast
Comparison: MRI lumbar spine [DATE]

CLINICAL DATA: Low back pain.

EXAM:
LUMBAR SPINE - COMPLETE WITH BENDING VIEWS

[Series 1: dg lumbar spine complete w/bend 6+v · 0.14mm/px · 7 of 7 slices shown]
[im 1/7]
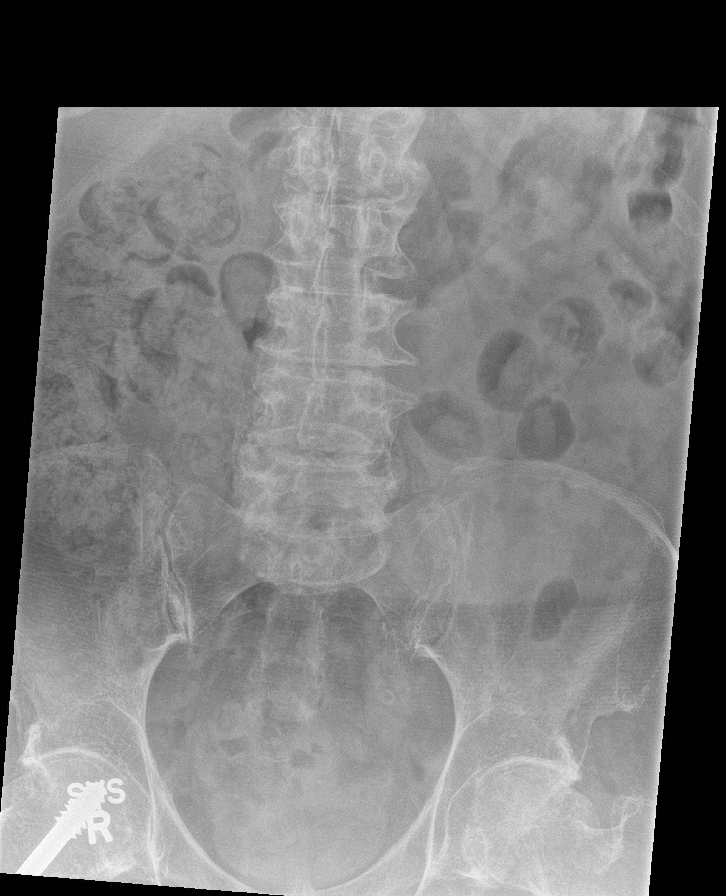
[im 2/7]
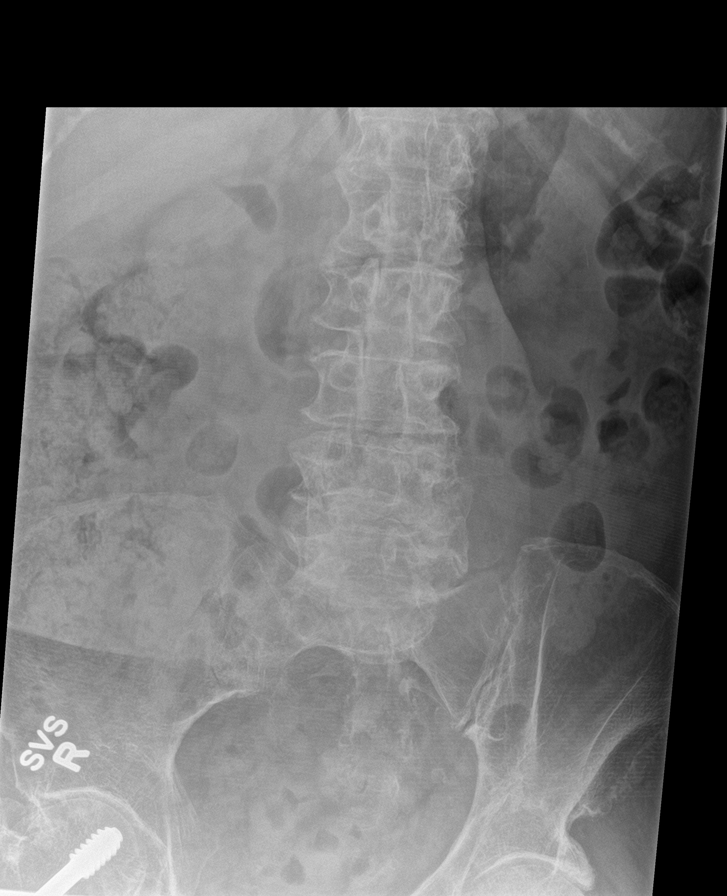
[im 3/7]
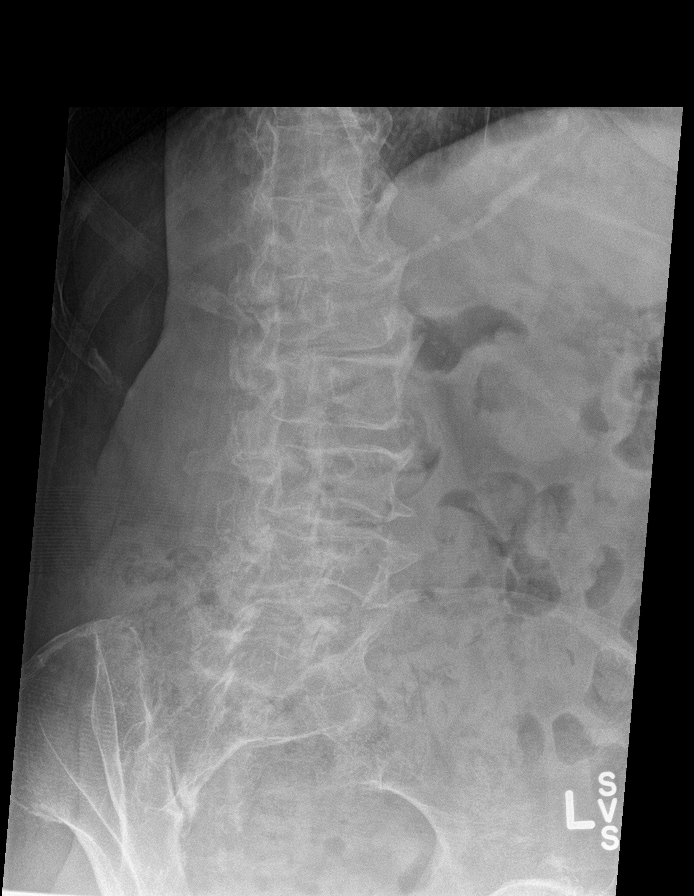
[im 4/7]
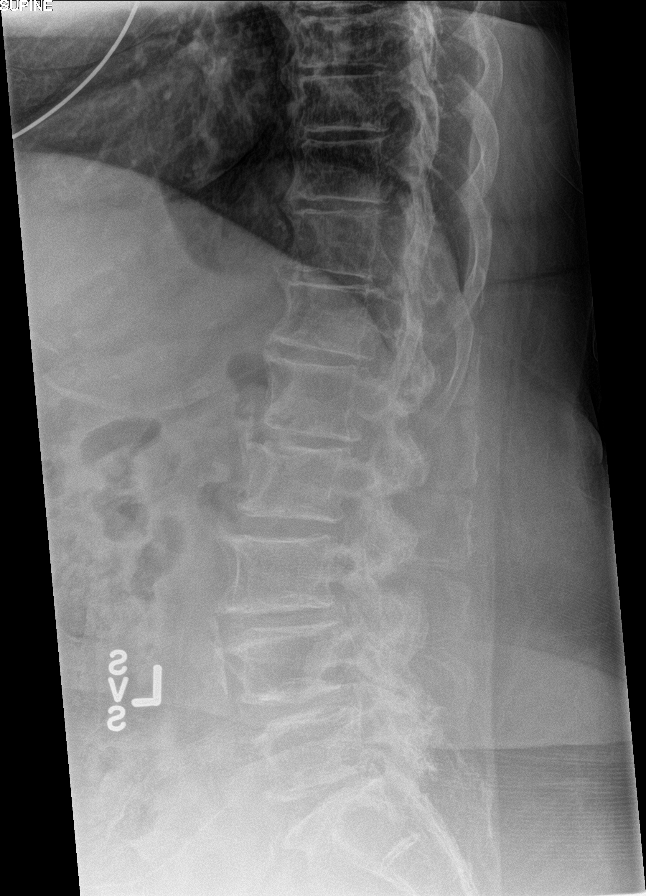
[im 5/7]
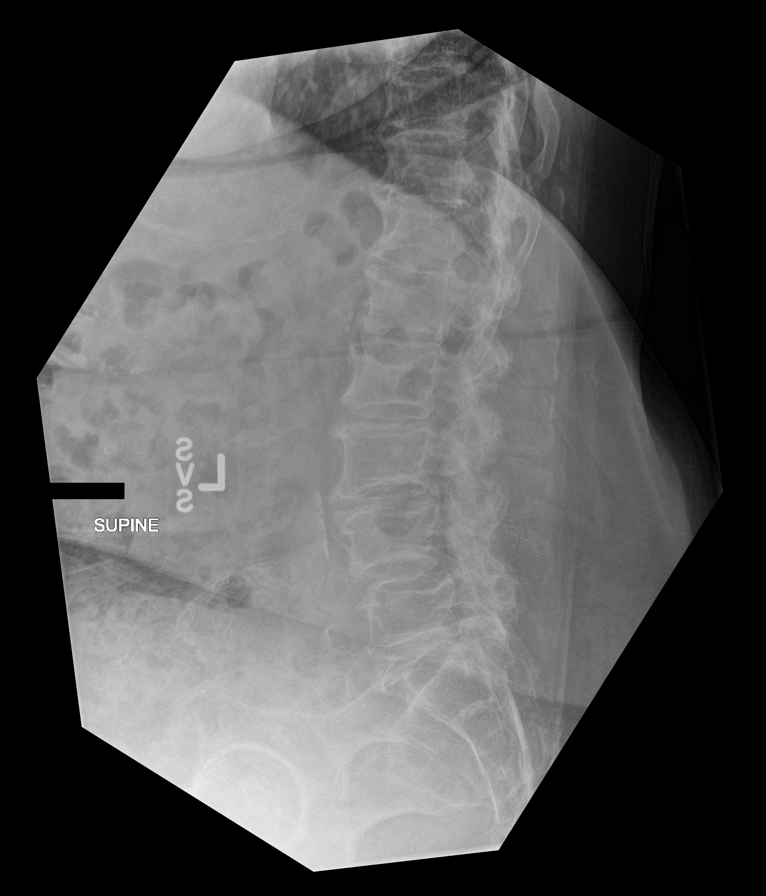
[im 6/7]
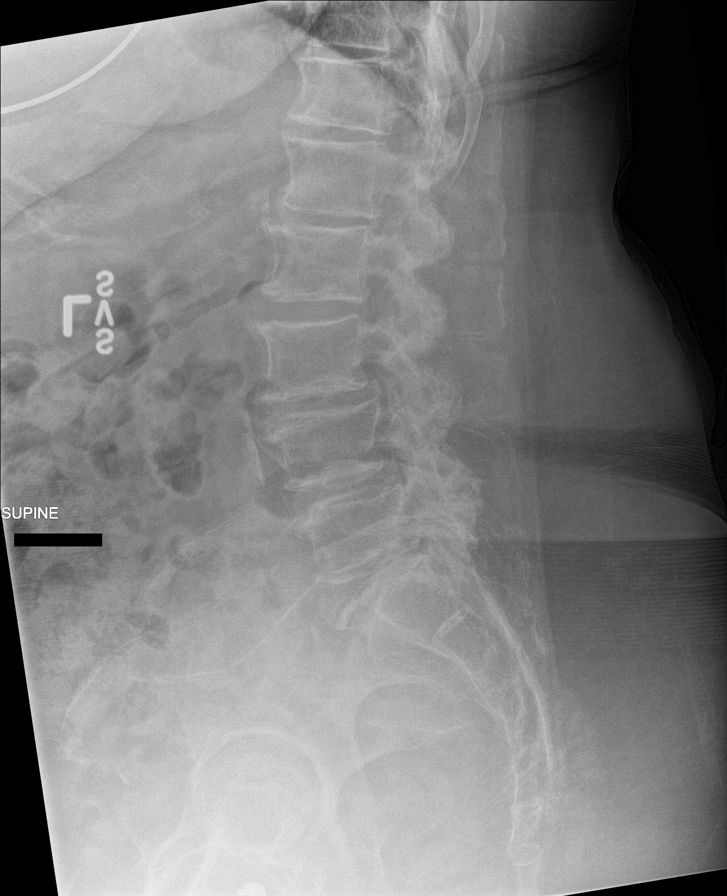
[im 7/7]
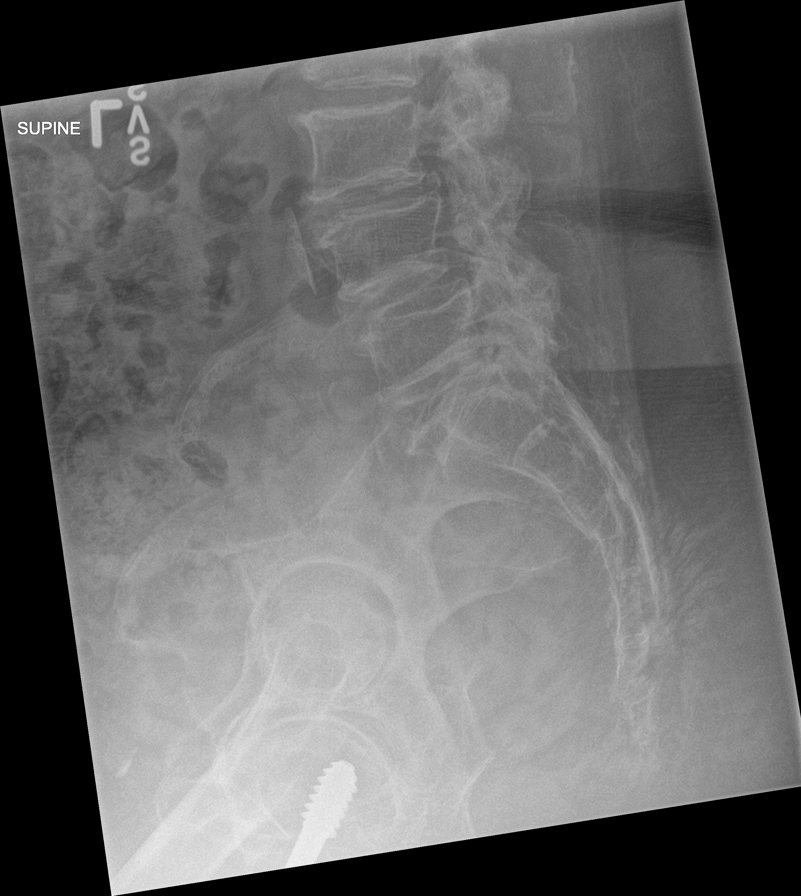

[7 of 7 positions shown; findings below may reference images not displayed]

FINDINGS: Vertebral body heights are maintained. There is mild to moderate
spondylosis throughout the lumbar spine to include moderate facet
arthropathy of the mid to lower lumbar spine. Disc space narrowing
throughout the lumbar spine with mild sparing of the L2-3 level.
Minimal grade 1 anterolisthesis of L3 on L4 and of L4 on L5
unchanged. No significant instability on flexion and extension.
IMPRESSION: 1. Mild to moderate spondylosis with disc disease throughout the
lumbar spine.
2. Minimal grade 1 anterolisthesis of L3 on L4 and L4 on L5
unchanged. No definite instability on flexion/extension.

## 2020-07-15 ENCOUNTER — Other Ambulatory Visit: Payer: Self-pay | Admitting: Psychiatry

## 2020-07-15 ENCOUNTER — Telehealth: Payer: Self-pay | Admitting: *Deleted

## 2020-07-15 DIAGNOSIS — F5101 Primary insomnia: Secondary | ICD-10-CM

## 2020-07-15 MED ORDER — ESZOPICLONE 1 MG PO TABS: 1.0000 mg | ORAL_TABLET | Freq: Every evening | ORAL | 0 refills | Status: DC | PRN

## 2020-07-15 NOTE — Telephone Encounter (Signed)
Ask her if she would like to try [facility] for sleep. Side effects including drowsiness. Let me know if she is interested so that I can send the medication to the pharmacy.   Past trials according to the chart- Seroquel, trazodone, Ambien, temazepam, mirtazpaine

## 2020-07-15 NOTE — Telephone Encounter (Signed)
Patient called stating her sleeping medication is not working. Per pt she is aware she is calling a bit late but she was trying to give the medication time to work but it is still not working. Per pt she would like to like to change to another sleeping medication.

## 2020-07-15 NOTE — Telephone Encounter (Signed)
Ordered Lunesta 1 mg at night for 14 days to last until her appointment with Dr. Elna Breslow. Please advise her to discontinue Sonata.

## 2020-07-15 NOTE — Telephone Encounter (Signed)
Per pt she will try it.

## 2020-07-16 NOTE — Telephone Encounter (Signed)
Called patient to inform her with information from provider and was not able to reach her. Staff LMOM. Staff called patient pharmacy and informed Joni Reining that per Dr. Vanetta Shawl to discontinue patient Sonata. Joni Reining agreed and verbalized understanding.

## 2020-07-17 LAB — TOXASSURE SELECT 13 (MW), URINE

## 2020-07-20 ENCOUNTER — Telehealth: Payer: Self-pay

## 2020-07-20 DIAGNOSIS — F3342 Major depressive disorder, recurrent, in full remission: Secondary | ICD-10-CM

## 2020-07-20 DIAGNOSIS — F5101 Primary insomnia: Secondary | ICD-10-CM

## 2020-07-20 MED ORDER — ESZOPICLONE 1 MG PO TABS
1.0000 mg | ORAL_TABLET | Freq: Every evening | ORAL | 1 refills | Status: DC | PRN
Start: 2020-07-20 — End: 2020-08-03

## 2020-07-20 NOTE — Telephone Encounter (Signed)
Returned call to patient, she reports she never got the [place] which was sent by Dr. Vanetta Shawl last week.  Contacted pharmacy, gave verbal order for Lunesta 1 mg at bedtime as needed for sleep-15 pills with 1 refill.  Advised patient to try this medication and give writer a call back if it does not work.

## 2020-07-20 NOTE — Telephone Encounter (Signed)
pt called left message that the sleeping medication is not working

## 2020-07-21 ENCOUNTER — Telehealth: Payer: Self-pay

## 2020-07-21 ENCOUNTER — Encounter: Payer: Self-pay | Admitting: Pain Medicine

## 2020-07-21 NOTE — Progress Notes (Signed)
Patient: Laura [name]  Service Category: E/M  Provider: Gaspar Cola, MD  DOB: 17-Dec-1945  DOS: 07/22/2020  Location: Office  MRN: [id number]  Setting: Ambulatory outpatient  Referring Provider: Adin Hector, MD  Type: Established Patient  Specialty: Interventional Pain Management  PCP: Adin Hector, MD  Location: Remote location  Delivery: TeleHealth     Virtual Encounter - Pain Management PROVIDER NOTE: Information contained herein reflects review and annotations entered in association with encounter. Interpretation of such information and data should be left to medically-trained personnel. Information provided to patient can be located elsewhere in the medical record under "Patient Instructions". Document created using STT-dictation technology, any transcriptional errors that may result from process are unintentional.    Contact & Pharmacy Preferred: [phone] Home: [phone] (home) Mobile: [phone] (mobile) E-mail: [email or web address]  [id number] [place]: [phone]: [phone]  Pre-screening  [name] offered "in-person" vs "virtual" encounter. She indicated preferring virtual for this encounter.   Reason COVID-19*  Social distancing based on CDC and AMA recommendations.   I contacted EZareah Hunzekeron 07/22/2020 via telephone.      I clearly identified myself as FGaspar Cola MD. I verified that I was speaking with the correct person using two identifiers (Name: [name] and date of birth: 8March 18, 1947.  Consent I sought verbal advanced consent from EMorene Rankinsfor virtual visit interactions. I informed Ms. Idrovo of possible security and privacy concerns, risks, and limitations associated with providing "not-in-person" medical evaluation and management services. I also informed Ms. Ayub of the availability of "in-person" appointments.  Finally, I informed her that there would be a charge for the virtual visit and that she could be  personally, fully or partially, financially responsible for it. Ms. ALosierexpressed understanding and agreed to proceed.   Historic Elements   Ms. EMalaijah Houchenis a 75y.o. year old, female patient evaluated today after our last contact on 07/07/2020. 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 She also  has a past surgical history that includes Abdominal hysterectomy; Cesarean section; Replacement total knee (Left); and Hip surgery (Right). Ms. ACraigohas a current medication list which includes the following prescription(s): albuterol, aspirin ec, azelastine, chlorthalidone, vitamin d, duloxetine, empagliflozin, eszopiclone, folic acid, hydroxychloroquine, hydroxyzine, infliximab, levothyroxine, levothyroxine, loratadine, methotrexate, metoprolol succinate, multi-vitamins, oxycodone, [START ON 08/12/2020] oxycodone, [START ON 09/11/2020] oxycodone, rosuvastatin, spironolactone, and valacyclovir. She  reports that she has never smoked. She has never used smokeless tobacco. She reports that she does not drink alcohol and does not use drugs. Ms. AMauneyis allergic to bupropion and penicillins.   HPI  Today, she is being contacted for a post-procedure assessment and to review the results of the lumbar spine x-rays.  [facility].  Previously referred to Dr. CElayne Guerinon 02/19/2020.  Lumbar spine x-rays done on 07/09/2020 to evaluate  stability of lumbar spine secondary around the area of her L3-4 and L4-5 anterolisthesis.  Lumbar spine x-rays on flexion and extension revealed: IMPRESSION: 1. Mild to moderate spondylosis with disc disease throughout the lumbar spine.  2. Minimal grade 1 anterolisthesis of L3 on L4 and L4 on L5 unchanged. No definite instability on flexion/extension.  Today she indicated having received a call from [facility].  I instructed her to call them back and arrange for the appointment.  She is not crazy about having surgery, but I informed her that her current condition is not going to simply disappear.  In fact, I reminded her that the normal sequence of events is that it will continue to progress until these epidural steroid injections will no longer be effective in controlling the pain.  For now, she has done well with those and today she indicates that she attained 100% relief of the pain with the last injection, for the duration of the local anesthetic, after which then it slowly went down to an ongoing 50% improvement.  At this point the patient indicates that she is doing well and does not need any further therapies for the time being.  RTCB: 10/11/2020  Post-Procedure Evaluation  Procedure (07/07/2020): Type: Therapeutic Inter-Laminar Epidural Steroid Injection  #3  Region: Lumbar Level: L2-3 Level. Laterality: Left    Pre-procedure pain level: 8/10 Post-procedure: 8/10 No initial benefit, possibly due to rapid discharge after no sedation procedure, without enough time to allow full onset of block.  Anxiolysis: none.  Effectiveness during initial hour after procedure (Ultra-Short Term Relief): 100 %.  Local anesthetic used: Long-acting (4-6 hours) Effectiveness: Defined as any analgesic benefit obtained secondary to the administration of local anesthetics. This carries significant diagnostic value as to the etiological location, or anatomical origin, of the pain. Duration of benefit  is expected to coincide with the duration of the local anesthetic used.  Effectiveness during initial 4-6 hours after procedure (Short-Term Relief): 100 %.  Long-term benefit: Defined as any relief past the pharmacologic duration of the local anesthetics.  Effectiveness past the initial 6 hours after procedure (Long-Term Relief): 50 % (ongoing).  Benefits, current: Defined as benefit present at the time of this evaluation.   Analgesia: Ongoing 50% improved Function: Ms. Barnhardt reports improvement in function ROM: [name] reports improvement in ROM  Pharmacotherapy Assessment  Analgesic: Oxycodone IR 5 mg every 6 hours (20 mg/day) MME/day: 30 mg/day.   Monitoring: [place] PMP: PDMP reviewed during this encounter.       Pharmacotherapy: No side-effects or adverse reactions reported. Compliance: No problems identified. Effectiveness: Clinically acceptable. Plan: Refer to "POC".  UDS:  Summary  Date Value Ref Range Status  07/09/2020 Note  Final    Comment:    ==================================================================== [employer] Select 13 (MW) ==================================================================== Test                             Result       Flag       Units  Drug Absent but Declared for Prescription Verification   Oxycodone                      Not Detected UNEXPECTED ng/mg creat ==================================================================== Test                      Result    Flag   Units      Ref Range   Creatinine              67               mg/dL      >=20 ==================================================================== Declared  Medications:  The flagging and interpretation on this report are based on the  following declared medications.  Unexpected results may arise from  inaccuracies in the declared medications.   **Note: The testing scope of this panel includes these medications:   Oxycodone   **Note: The testing scope of this  panel does not include the  following reported medications:   Albuterol  Aspirin  Azelastine  Chlorthalidone  Cholecalciferol  Duloxetine (Cymbalta)  Empagliflozin (Jardiance)  Hydroxychloroquine (Plaquenil)  Hydroxyzine  Infliximab (Remicade)  Levothyroxine  Loratadine (Claritin)  Methotrexate  Metoprolol  Multivitamin  Rosuvastatin  Spironolactone  Valacyclovir (Valtrex)  Zaleplon (Sonata) ==================================================================== For clinical consultation, please call [phone]. ====================================================================     Laboratory Chemistry Profile   Renal Lab Results  Component Value Date   BUN 24 (H) 10/03/2019   CREATININE 1.10 (H) 10/03/2019   GFRAA 57 (L) 10/03/2019   GFRNONAA 49 (L) 10/03/2019    Hepatic Lab Results  Component Value Date   AST 23 10/03/2019   ALT 16 10/03/2019   ALBUMIN 4.4 10/03/2019   ALKPHOS 55 10/03/2019    Electrolytes Lab Results  Component Value Date   NA 136 10/03/2019   K 4.2 10/03/2019   CL 100 10/03/2019   CALCIUM 9.0 10/03/2019   MG 2.3 10/08/2018    Bone Lab Results  Component Value Date   25OHVITD1 36 07/15/2015   25OHVITD2 3.6 07/15/2015   25OHVITD3 32 07/15/2015    Inflammation (CRP: Acute Phase) (ESR: Chronic Phase) Lab Results  Component Value Date   CRP 1.9 (H) 07/15/2015   ESRSEDRATE 32 (H) 07/15/2015   LATICACIDVEN 1.4 10/07/2018       Note: Above Lab results reviewed.  Imaging  DG Lumbar Spine Complete W/Bend CLINICAL DATA:  Low back pain.  EXAM: LUMBAR SPINE - COMPLETE WITH BENDING VIEWS  COMPARISON:  MRI lumbar spine 12/26/2019  FINDINGS: Vertebral body heights are maintained. There is mild to moderate spondylosis throughout the lumbar spine to include moderate facet arthropathy of the mid to lower lumbar spine. Disc space narrowing throughout the lumbar spine with mild sparing of the L2-3 level. Minimal grade 1  anterolisthesis of L3 on L4 and of L4 on L5 unchanged. No significant instability on flexion and extension.  IMPRESSION: 1. Mild to moderate spondylosis with disc disease throughout the lumbar spine. 2. Minimal grade 1 anterolisthesis of L3 on L4 and L4 on L5 unchanged. No definite instability on flexion/extension.  Electronically Signed   By: Marin Olp M.D.   On: 07/10/2020 14:29  Assessment  The primary encounter diagnosis was Chronic pain syndrome. 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  Plan of Care  Problem-specific:  No problem-specific Assessment & Plan notes found for this encounter.  Ms. Kaloni Bisaillon has a current medication list which includes the following long-term medication(s): albuterol, azelastine, chlorthalidone, duloxetine, eszopiclone, infliximab, levothyroxine, levothyroxine, loratadine, metoprolol succinate, oxycodone, [START ON 08/12/2020] oxycodone, [START ON 09/11/2020] oxycodone, and spironolactone.  Pharmacotherapy (Medications Ordered): No orders of the defined types were  placed in this encounter.  Orders:  No orders of the defined types were placed in this encounter.  Follow-up plan:   Return in about 3 months (around 10/11/2020) for evaluation day (F2F) (MM).     Interventional Therapies  Risk  Complexity Considerations:   Estimated body mass index is  41.16 kg/m as calculated from the following:   Height as of this encounter: _0  (1.676 m).   Weight as of this encounter: 255 lb (115.7 kg). PLAQUENIL Anticoagulation (Stop: 11 days)   Planned  Pending:   Therapeutic midline L2-3 LESI #2 (05/28/2020)    Under consideration:   Diagnostic bilateral genicular NB  Diagnostic bilateral L3 and/or L5 TFESI    Completed:   Therapeutic right IA steroid knee injection x2 (10/15/2015)  Therapeutic right Hyalgan knee injection x1 (09/01/2016)  Therapeutic left lumbar facet block x1 (01/22/2015)  Therapeutic left IA hip injection x1 (03/17/2015)  Therapeutic left L4-5 LESI x1 (02/04/2020)  Therapeutic midline L2-3 LESI x1 (04/14/2020)    Therapeutic  Palliative (PRN) options:   Therapeutic right IA Hyalgan knee injections Palliative left IA hip joint injection  Diagnostic left lumbar facet block #2       Recent Visits Date Type Provider Dept  07/07/20 Procedure visit Milinda Pointer, MD [facility]  06/16/20 Telemedicine Milinda Pointer, MD [facility]  05/28/20 Procedure visit Milinda Pointer, MD [facility]  05/05/20 Telemedicine Milinda Pointer, MD [facility]  Showing recent visits within past 90 days and meeting all other requirements Today's Visits Date Type Provider Dept  07/22/20 Telemedicine Milinda Pointer, MD [facility]  Showing today's visits and meeting all other requirements Future Appointments No visits were found meeting these conditions. Showing future appointments within next 90 days and meeting all other requirements I discussed the assessment and treatment plan with the patient. The patient was provided an opportunity to ask questions and all were answered. The patient agreed with the plan and demonstrated an understanding of the instructions.  Patient advised to call back or seek an in-person evaluation if the symptoms or condition worsens.  Duration of  encounter: 20 minutes.  Note by: Gaspar Cola, MD Date: 07/22/2020; Time: 9:56 AM

## 2020-07-21 NOTE — Telephone Encounter (Signed)
LM for patient to call office for pre virtual appointment questions.  

## 2020-07-22 ENCOUNTER — Ambulatory Visit: Payer: Medicare Other | Attending: Pain Medicine | Admitting: Pain Medicine

## 2020-07-22 ENCOUNTER — Other Ambulatory Visit: Payer: Self-pay

## 2020-07-22 DIAGNOSIS — G894 Chronic pain syndrome: Secondary | ICD-10-CM

## 2020-07-22 DIAGNOSIS — M48061 Spinal stenosis, lumbar region without neurogenic claudication: Secondary | ICD-10-CM | POA: Diagnosis not present

## 2020-07-22 DIAGNOSIS — M2428 Disorder of ligament, vertebrae: Secondary | ICD-10-CM

## 2020-07-22 DIAGNOSIS — M5137 Other intervertebral disc degeneration, lumbosacral region: Secondary | ICD-10-CM

## 2020-07-22 DIAGNOSIS — M5442 Lumbago with sciatica, left side: Secondary | ICD-10-CM | POA: Diagnosis not present

## 2020-07-22 DIAGNOSIS — M48062 Spinal stenosis, lumbar region with neurogenic claudication: Secondary | ICD-10-CM

## 2020-07-22 DIAGNOSIS — M4316 Spondylolisthesis, lumbar region: Secondary | ICD-10-CM

## 2020-07-22 DIAGNOSIS — R937 Abnormal findings on diagnostic imaging of other parts of musculoskeletal system: Secondary | ICD-10-CM

## 2020-07-22 DIAGNOSIS — M51379 Other intervertebral disc degeneration, lumbosacral region without mention of lumbar back pain or lower extremity pain: Secondary | ICD-10-CM

## 2020-07-22 DIAGNOSIS — M21372 Foot drop, left foot: Secondary | ICD-10-CM

## 2020-07-22 DIAGNOSIS — M5441 Lumbago with sciatica, right side: Secondary | ICD-10-CM

## 2020-07-22 DIAGNOSIS — M47816 Spondylosis without myelopathy or radiculopathy, lumbar region: Secondary | ICD-10-CM

## 2020-07-22 DIAGNOSIS — G8929 Other chronic pain: Secondary | ICD-10-CM

## 2020-07-22 DIAGNOSIS — M5410 Radiculopathy, site unspecified: Secondary | ICD-10-CM

## 2020-07-28 ENCOUNTER — Other Ambulatory Visit: Payer: Self-pay

## 2020-07-28 ENCOUNTER — Telehealth (INDEPENDENT_AMBULATORY_CARE_PROVIDER_SITE_OTHER): Payer: Medicare Other | Admitting: Psychiatry

## 2020-07-28 ENCOUNTER — Encounter: Payer: Self-pay | Admitting: Psychiatry

## 2020-07-28 DIAGNOSIS — F5101 Primary insomnia: Secondary | ICD-10-CM

## 2020-07-28 DIAGNOSIS — F3342 Major depressive disorder, recurrent, in full remission: Secondary | ICD-10-CM

## 2020-07-28 MED ORDER — DULOXETINE HCL 60 MG PO CPEP: 60.0000 mg | ORAL_CAPSULE | Freq: Every day | ORAL | 1 refills | Status: DC

## 2020-07-28 NOTE — Progress Notes (Signed)
Virtual Visit via Telephone Note  I connected with Laura [name] on 07/28/20 at  3:00 PM EDT by telephone and verified that I am speaking with the correct person using two identifiers.  Location Provider Location : [facility] Patient Location : Home  Participants: Patient , Spouse,Provider   I discussed the limitations, risks, security and privacy concerns of performing an evaluation and management service by telephone and the availability of in person appointments. I also discussed with the patient that there may be a patient responsible charge related to this service. The patient expressed understanding and agreed to proceed.   I discussed the assessment and treatment plan with the patient. The patient was provided an opportunity to ask questions and all were answered. The patient agreed with the plan and demonstrated an understanding of the instructions.   The patient was advised to call back or seek an in-person evaluation if the symptoms worsen or if the condition fails to improve as anticipated.   BH MD OP Progress Note  07/28/2020 3:38 PM Laura [name]  MRN:  [id number]  Chief Complaint:  Chief Complaint   Follow-up; Depression; Insomnia    HPI: Laura [name] is a 75 year old Caucasian female who has a history of MDD, primary insomnia, chronic pain was evaluated by telemedicine today.  Patient today reports she is currently in a lot of pain.  She reports her left leg is numb and she is unable to move it and hence has been having trouble with her ability to walk, get in and out of shower and so on.  She also reports she is having pain of her hands especially her left side since it is curling up on her due to her rheumatoid arthritis.  She currently needs a lot of help with her daily functioning and her husband has been very helpful.  Patient reports she is compliant on the Cymbalta.  Her current pain and her medical issues does make her anxious however she has been coping  well.  Since being on the Lunesta sleep has improved.  She denies any side effects.  She is trying to avoid combining it with her pain medications since she is aware of side effects.  Patient denies any suicidality, homicidality or perceptual disturbances.  Patient in session appeared to be very pleasant and cheerful.  Patient had recent appointment with her primary pain provider Dr. Donneta Romberg had lumbar spine x-ray done on 07/09/2020 to evaluate stability of lumbar spine secondary around the area of  L3-4 and L4-5 anterolisthesis.  Lumbar spine x-ray revealed mild to moderate spondylosis with disc disease throughout the lumbar spine.  Grade 1 anterolisthesis of L3 on L4 and L4 on L5.  Patient referred to [facility].  Visit Diagnosis:    ICD-10-CM   1. MDD (major depressive disorder), recurrent, in full remission (HCC)  F33.42 DULoxetine (CYMBALTA) 60 MG capsule    2. Primary insomnia  F51.01       Past Psychiatric History: I have reviewed past psychiatric history from progress note on 06/13/2018  Past Medical History:  Past Medical History:  Diagnosis Date   Allergy    Anxiety    Arthritis, degenerative 10/08/2013   Overview:    a.  Lumbar spine/spinal stenosis/foot drop.   b.  Hands.    Asthma    Chronic hand pain, left 10/10/2019   Chronic kidney disease    Depression    Lumbar spinal stenosis with neurogenic claudication 11/07/2014   Major depression, single episode, in complete remission (HCC)  06/25/2015   Memory loss, short term 03/19/2014   Sciatica of left side 12/31/2019   Seizure (HCC) 10/07/2014   Sleep apnea    Sleep apnea    Thyroid disease     Past Surgical History:  Procedure Laterality Date   ABDOMINAL HYSTERECTOMY     CESAREAN SECTION     HIP SURGERY Right    REPLACEMENT TOTAL KNEE Left     Family Psychiatric History: I have reviewed family psychiatric history from progress note on 06/13/2018  Family History:  Family History  Problem Relation Age  of Onset   Hypertension Mother    Stroke Mother    Heart attack Father    Alcohol abuse Father    Depression Father    Post-traumatic stress disorder Father    Rheum arthritis Sister    Hypertension Sister    Depression Sister    Breast cancer Neg Hx     Social History: I have reviewed social history from progress note on 06/13/2018 Social History   Socioeconomic History   Marital status: Married    Spouse name: Not on file   Number of children: Not on file   Years of education: Not on file   Highest education level: Not on file  Occupational History    Comment: retired  Tobacco Use   Smoking status: Never   Smokeless tobacco: Never  Vaping Use   Vaping Use: Never used  Substance and Sexual Activity   Alcohol use: No    Alcohol/week: 0.0 standard drinks   Drug use: No   Sexual activity: Not Currently  Other Topics Concern   Not on file  Social History Narrative   Not on file   Social Determinants of Health   Financial Resource Strain: Not on file  Food Insecurity: Not on file  Transportation Needs: Not on file  Physical Activity: Not on file  Stress: Not on file  Social Connections: Not on file    Allergies:  Allergies  Allergen Reactions   Bupropion    Penicillins Rash    Metabolic Disorder Labs: No results found for: HGBA1C, MPG No results found for: PROLACTIN No results found for: CHOL, TRIG, HDL, CHOLHDL, VLDL, LDLCALC Lab Results  Component Value Date   TSH 0.383 10/07/2018    Therapeutic Level Labs: No results found for: LITHIUM No results found for: VALPROATE No components found for:  CBMZ  Current Medications: Current Outpatient Medications  Medication Sig Dispense Refill   albuterol (VENTOLIN HFA) 108 (90 Base) MCG/ACT inhaler Inhale 2 puffs into the lungs every 6 (six) hours as needed for wheezing or shortness of breath. 18 g 3   aspirin EC 81 MG tablet Take by mouth.     azelastine (ASTELIN) 0.1 % nasal spray Place into the nose.      chlorthalidone (HYGROTON) 25 MG tablet TAKE 1/2 TABLET (12.5MG ) BY MOUTH EVERY DAY  11   Cholecalciferol (VITAMIN D) 2000 UNITS tablet Take by mouth daily.      DULoxetine (CYMBALTA) 60 MG capsule Take 1 capsule (60 mg total) by mouth daily. 90 capsule 1   empagliflozin (JARDIANCE) 10 MG TABS tablet Take 1 tablet by mouth daily.     eszopiclone (LUNESTA) 1 MG TABS tablet Take 1 tablet (1 mg total) by mouth at bedtime as needed for sleep. Take immediately before bedtime 15 tablet 1   FLOVENT DISKUS 100 MCG/BLIST AEPB SMARTSIG:1 Inhalation Via Inhaler Twice Daily     folic acid (FOLVITE) 1 MG tablet Take  1 mg by mouth daily.     hydroxychloroquine (PLAQUENIL) 200 MG tablet Take 100 mg by mouth daily.      hydrOXYzine (VISTARIL) 25 MG capsule Take 1-2 capsules (25-50 mg total) by mouth at bedtime as needed. For sleep 180 capsule 1   inFLIXimab (REMICADE) 100 MG injection Inject 100 mg into the vein every 8 (eight) weeks.     levothyroxine (SYNTHROID) 50 MCG tablet Take 25 mcg by mouth at bedtime.     levothyroxine (SYNTHROID, LEVOTHROID) 200 MCG tablet 200 mcg daily before breakfast.  5   loratadine (CLARITIN) 10 MG tablet Take 10 mg by mouth daily.      methotrexate (RHEUMATREX) 2.5 MG tablet TAKE 4 TABLETS (10 MG TOTAL) BY MOUTH EVERY 7 (SEVEN) DAYS WITH A MEAL  5   metoprolol succinate (TOPROL-XL) 100 MG 24 hr tablet Take 1 tablet (100 mg total) by mouth daily. Take with or immediately following a meal. 30 tablet 0   Multiple Vitamin (MULTI-VITAMINS) TABS Take by mouth.     oxyCODONE (OXY IR/ROXICODONE) 5 MG immediate release tablet Take 1 tablet (5 mg total) by mouth every 6 (six) hours as needed for severe pain. Must last 30 days 120 tablet 0   [START ON 08/12/2020] oxyCODONE (OXY IR/ROXICODONE) 5 MG immediate release tablet Take 1 tablet (5 mg total) by mouth every 6 (six) hours as needed for severe pain. Must last 30 days 120 tablet 0   [START ON 09/11/2020] oxyCODONE (OXY IR/ROXICODONE) 5 MG  immediate release tablet Take 1 tablet (5 mg total) by mouth every 6 (six) hours as needed for severe pain. Must last 30 days 120 tablet 0   rosuvastatin (CRESTOR) 5 MG tablet Take 5 mg by mouth 2 (two) times a week.     spironolactone (ALDACTONE) 25 MG tablet TAKE 1/2 (ONE-HALF) TABLET BY MOUTH DAILY  11   valACYclovir (VALTREX) 1000 MG tablet PLEASE SEE ATTACHED FOR DETAILED DIRECTIONS     No current facility-administered medications for this visit.     Musculoskeletal: Strength & Muscle Tone:  UTA Gait & Station:  UTA Patient leans: N/A  Psychiatric Specialty Exam: Review of Systems  Musculoskeletal:  Positive for arthralgias, back pain and gait problem.  Psychiatric/Behavioral:  Positive for sleep disturbance (improving). The patient is nervous/anxious.   All other systems reviewed and are negative.  There were no vitals taken for this visit.There is no height or weight on file to calculate BMI.  General Appearance:  UTA  Eye Contact:   UTA  Speech:  Clear and Coherent  Volume:  Normal  Mood:  Anxious coping well  Affect:   UTA  Thought Process:  Goal Directed and Descriptions of Associations: Intact  Orientation:  Full (Time, Place, and Person)  Thought Content: Logical   Suicidal Thoughts:  No  Homicidal Thoughts:  No  Memory:  Immediate;   Fair Recent;   Fair Remote;   Fair  Judgement:  Fair  Insight:  Fair  Psychomotor Activity:   UTA  Concentration:  Concentration: Fair and Attention Span: Fair  Recall:  [employer] of Knowledge: Fair  Language: Fair  Akathisia:  No  Handed:  Right  AIMS (if indicated): not done  Assets:  Communication Skills Desire for Improvement Housing Intimacy Social Support  ADL's:  Intact  Cognition: WNL  Sleep:   Improving   Screenings: AIMS    Flowsheet Row Office Visit from 04/28/2020 in [facility]  AIMS Total Score 0  PHQ2-9    Flowsheet Row Procedure visit from 07/07/2020 in [facility] Procedure visit from 05/28/2020 in [facility] Office Visit from 04/28/2020 in [facility] Video Visit from 03/24/2020 in [facility] Video Visit from 02/19/2020 in [facility]  PHQ-2 Total Score 0 0 2 0 0  PHQ-9 Total Score -- -- 5 -- --      Flowsheet Row Office Visit from 04/28/2020 in [facility] Video Visit from 03/24/2020 in [facility] Video Visit from 02/19/2020 in [facility]  C-SSRS RISK CATEGORY No Risk No Risk No Risk        Assessment and Plan: Laura [name] is a 75 year old Caucasian female, married, retired, lives in [place], has a history of depression, multiple medical problems, chronic pain, rheumatoid arthritis was evaluated by telemedicine today.  Patient with recent sleep problems and is currently making progress on the Lunesta.  Plan as noted below.  Plan MDD in remission Cymbalta 60 mg p.o. daily-reduced dosage  Primary insomnia-improving Lunesta 1 mg p.o. nightly. Provided medication education including drug to drug interaction with her medications including her pain medications.  Patient referred by her primary pain provider to [facility] .  Patient will benefit from sufficient pain management.  Follow-up in clinic in 2 months or sooner if needed.   I have spent at least 20 minutes non face to face with patient today .  This note was generated in part or whole with voice recognition software. Voice recognition is usually quite accurate but there are transcription errors that can and very often do occur. I apologize for any typographical errors that were not detected and corrected.   Jomarie Longs, MD 07/29/2020, 8:22 AM

## 2020-08-03 ENCOUNTER — Telehealth: Payer: Self-pay

## 2020-08-03 DIAGNOSIS — F5101 Primary insomnia: Secondary | ICD-10-CM

## 2020-08-03 MED ORDER — ESZOPICLONE 2 MG PO TABS: 2.0000 mg | ORAL_TABLET | Freq: Every evening | ORAL | 1 refills | Status: DC | PRN

## 2020-08-03 NOTE — Telephone Encounter (Signed)
I have sent Lunesta at the pharmacy with the higher dosage-2 mg.

## 2020-08-03 NOTE — Telephone Encounter (Signed)
pt asked if she can increase her lunesta states it not working like it was.  if so she needs a new rx sent in.  if not she still need a rx sent in.

## 2020-08-04 NOTE — Telephone Encounter (Signed)
Left message for pt

## 2020-08-06 MED ORDER — ESZOPICLONE 3 MG PO TABS: 3.0000 mg | ORAL_TABLET | Freq: Every day | ORAL | 0 refills | Status: DC

## 2020-08-06 MED ORDER — HYDROXYZINE PAMOATE 25 MG PO CAPS: 25.0000 mg | ORAL_CAPSULE | Freq: Every evening | ORAL | 1 refills | Status: DC | PRN

## 2020-08-06 NOTE — Telephone Encounter (Signed)
pt called states that sleeping medication still not working she states that she even took a 10mg  melantion and she still not sleeping.

## 2020-08-06 NOTE — Telephone Encounter (Signed)
Patient reports she is still not sleeping.  She reports she is likely anxious and thinking about different things at night which could be keeping her up at night.  Increase Lunesta to 3 mg.  If she is unable to sleep just with the Lunesta 3 mg she could add a low-dose of hydroxyzine 25 mg at bedtime as needed with the Lunesta.  I have sent prescriptions for both medication to the pharmacy.

## 2020-08-10 ENCOUNTER — Telehealth: Payer: Self-pay

## 2020-08-10 DIAGNOSIS — F5101 Primary insomnia: Secondary | ICD-10-CM

## 2020-08-10 MED ORDER — ZOLPIDEM TARTRATE 10 MG PO TABS: 5.0000 mg | ORAL_TABLET | Freq: Every evening | ORAL | 0 refills | Status: DC | PRN

## 2020-08-10 NOTE — Telephone Encounter (Signed)
Returned call to patient.  She is not sleeping on the Lunesta.  Stop Lunesta.  She tried Ambien several years ago.  Advised patient to talk to pain provider about opioid and Ambien drug to drug interaction.  However she could start Ambien 5 to 10 mg at bedtime as needed if her pain management is okay with that.  I will send Ambien 10 mg to pharmacy with instructions.  Provided medication education.

## 2020-08-10 NOTE — Telephone Encounter (Signed)
pt called left message that the 3mg  is not working- she is still not sleeping.

## 2020-08-11 ENCOUNTER — Ambulatory Visit: Payer: BC Managed Care – PPO | Admitting: Pain Medicine

## 2020-08-17 NOTE — Progress Notes (Signed)
PROVIDER NOTE: Information contained herein reflects review and annotations entered in association with encounter. Interpretation of such information and data should be left to medically-trained personnel. Information provided to patient can be located elsewhere in the medical record under "Patient Instructions". Document created using STT-dictation technology, any transcriptional errors that may result from process are unintentional.    Patient: Laura [name]  Service Category: E/M  Provider: Gaspar Cola, MD  DOB: Dec 24, 1945  DOS: 08/18/2020  Specialty: Interventional Pain Management  MRN: [id number]  Setting: Ambulatory outpatient  PCP: Adin Hector, MD  Type: Established Patient    Referring Provider: Adin Hector, MD  Location: Office  Delivery: Face-to-face     HPI  Ms. Diantha Paxson, a 75 y.o. year old female, is here today because of her Chronic pain syndrome [G89.4]. Ms. Maj's primary complain today is Back Pain (Lumbar left is worse, toes are numb.  Right leg hurts as well ) Last encounter: My last encounter with her was on 07/07/2020. Pertinent problems: Ms. 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); Lumbar foraminal stenosis (Bilateral L3-4 and L5-S1); Chronic hip pain (Left); Osteoarthritis of knee (Right); Arthritis; Primary fibromyalgia syndrome; Chronic arthralgias of knees and hips (Right); Chronic hand pain, left; Dropfoot (Left); Weakness of foot (Left); Sciatica (Left); Weakness of leg (Left); Chronic pain of lower  extremity (Bilateral); Lumbosacral radiculopathy at L5 (Left); Abnormal MRI, lumbar spine (12/26/2019); DDD (degenerative disc disease), lumbosacral; Statin myopathy; Foot drop, left; Ligamentum flavum hypertrophy (L1-2, L2-3, L3-4); Lumbar lateral recess stenosis (Bilateral) (L2-3, L3-4, L4-5) (Severe: L3-4); Lumbar Grade 1 Anterolisthesis of L3/L4 and L4/L5; and Chronic radicular pain of lower extremity (Bilateral) on their pertinent problem list. Pain Assessment: Severity of Chronic pain is reported as a 5 /10. Location: Back Lower, Left, Right/pain is going down both legs. Onset: More than a month ago. Quality: Discomfort, Constant, Numbness. Timing: Constant. Modifying factor(s): pain medications. Vitals:  height is '5\' 6"'  (1.676 m) and weight is 245 lb (111.1 kg). Her temporal temperature is 96.6 F (35.9 C) (abnormal). Her blood pressure is 120/99 (abnormal) and her pulse is 82. Her respiration is 16 and oxygen saturation is 100%.   Reason for encounter: medication management.    The patient indicates doing well with the current medication regimen. No adverse reactions or side effects reported to the medications.   The patient was scheduled for a lumbar epidural steroid injection however, today she is doing well and she does not seem to needed at this time.  We will take the opportunity to do her medication refills.  Pharmacotherapy Assessment  Analgesic: Oxycodone IR 5 mg every 6 hours (20 mg/day) MME/day: 30 mg/day.   Monitoring: [place] PMP: PDMP reviewed during this encounter.       Pharmacotherapy: No side-effects or adverse reactions reported. Compliance: No problems identified. Effectiveness: Clinically acceptable.  Janett Billow, RN  08/18/2020  9:24 AM  Sign when Signing Visit Safety precautions to be maintained throughout the outpatient stay will include: orient to surroundings, keep bed in low position, maintain call bell within reach at all times, provide assistance with  transfer out of bed and  ambulation.      UDS:  Summary  Date Value Ref Range Status  07/09/2020 Note  Final    Comment:    ==================================================================== [employer] 13 (MW) ==================================================================== Test                             Result       Flag       Units  Drug Absent but Declared for Prescription Verification   Oxycodone                      Not Detected UNEXPECTED ng/mg creat ==================================================================== Test                      Result    Flag   Units      Ref Range   Creatinine              67               mg/dL      >=20 ==================================================================== Declared Medications:  The flagging and interpretation on this report are based on the  following declared medications.  Unexpected results may arise from  inaccuracies in the declared medications.   **Note: The testing scope of this panel includes these medications:   Oxycodone   **Note: The testing scope of this panel does not include the  following reported medications:   Albuterol  Aspirin  Azelastine  Chlorthalidone  Cholecalciferol  Duloxetine (Cymbalta)  Empagliflozin (Jardiance)  Hydroxychloroquine (Plaquenil)  Hydroxyzine  Infliximab (Remicade)  Levothyroxine  Loratadine (Claritin)  Methotrexate  Metoprolol  Multivitamin  Rosuvastatin  Spironolactone  Valacyclovir (Valtrex)  Zaleplon (Sonata) ==================================================================== For clinical consultation, please call [phone]. ====================================================================      ROS  Constitutional: Denies any fever or chills Gastrointestinal: No reported hemesis, hematochezia, vomiting, or acute GI distress Musculoskeletal: Denies any acute onset joint swelling, redness, loss of ROM, or weakness Neurological: No  reported episodes of acute onset apraxia, aphasia, dysarthria, agnosia, amnesia, paralysis, loss of coordination, or loss of consciousness  Medication Review  DULoxetine, Fluticasone Propionate (Inhal), Multi-Vitamins, Vitamin D, albuterol, aspirin EC, azelastine, chlorthalidone, empagliflozin, folic acid, hydrOXYzine, hydroxychloroquine, inFLIXimab, levothyroxine, loratadine, methotrexate, metoprolol succinate, oxyCODONE, rosuvastatin, spironolactone, valACYclovir, and zolpidem  History Review  Allergy: Ms. Odden is allergic to bupropion and penicillins. Drug: Ms. Cuadra  reports no history of drug use. Alcohol:  reports no history of alcohol use. Tobacco:  reports that she has never smoked. She has never used smokeless tobacco. Social: Ms. Huante  reports that she has never smoked. She has never used smokeless tobacco. She reports that she does not drink alcohol and does not use drugs. Medical:  has a past medical history of Allergy, Anxiety, Arthritis, degenerative (10/08/2013), Asthma, Chronic hand pain, left (10/10/2019), Chronic kidney disease, Depression, Lumbar spinal stenosis with neurogenic claudication (11/07/2014), Major depression, single episode, in complete remission ([place]) (06/25/2015), Memory loss, short term (03/19/2014), Sciatica of left side (12/31/2019), Seizure ([place]) (10/07/2014), Sleep apnea, Sleep apnea, and Thyroid disease. Surgical: Ms. Clift  has a past surgical history that includes Abdominal hysterectomy; Cesarean section; Replacement total knee (Left); and Hip surgery (Right). Family: family history includes Alcohol abuse in her father; Depression in her father and sister; Heart attack in her father; Hypertension in her mother and sister; Post-traumatic stress disorder in her father; Rheum arthritis in her sister; Stroke in her mother.  Laboratory Chemistry Profile  Renal Lab Results  Component Value Date   BUN 24 (H) 10/03/2019   CREATININE 1.10 (H) 10/03/2019    GFRAA 57 (L) 10/03/2019   GFRNONAA 49 (L) 10/03/2019    Hepatic Lab Results  Component Value Date   AST 23 10/03/2019   ALT 16 10/03/2019   ALBUMIN 4.4 10/03/2019   ALKPHOS 55 10/03/2019    Electrolytes Lab Results  Component Value Date   NA 136 10/03/2019   K 4.2 10/03/2019   CL 100 10/03/2019   CALCIUM 9.0 10/03/2019   MG 2.3 10/08/2018    Bone Lab Results  Component Value Date   25OHVITD1 36 07/15/2015   25OHVITD2 3.6 07/15/2015   25OHVITD3 32 07/15/2015    Inflammation (CRP: Acute Phase) (ESR: Chronic Phase) Lab Results  Component Value Date   CRP 1.9 (H) 07/15/2015   ESRSEDRATE 32 (H) 07/15/2015   LATICACIDVEN 1.4 10/07/2018         Note: Above Lab results reviewed.  Recent Imaging Review  DG Lumbar Spine Complete W/Bend CLINICAL DATA:  Low back pain.  EXAM: LUMBAR SPINE - COMPLETE WITH BENDING VIEWS  COMPARISON:  MRI lumbar spine 12/26/2019  FINDINGS: Vertebral body heights are maintained. There is mild to moderate spondylosis throughout the lumbar spine to include moderate facet arthropathy of the mid to lower lumbar spine. Disc space narrowing throughout the lumbar spine with mild sparing of the L2-3 level. Minimal grade 1 anterolisthesis of L3 on L4 and of L4 on L5 unchanged. No significant instability on flexion and extension.  IMPRESSION: 1. Mild to moderate spondylosis with disc disease throughout the lumbar spine. 2. Minimal grade 1 anterolisthesis of L3 on L4 and L4 on L5 unchanged. No definite instability on flexion/extension.  Electronically Signed   By: Marin Olp M.D.   On: 07/10/2020 14:29 Note: Reviewed        Physical Exam  General appearance: Well nourished, well developed, and well hydrated. In no apparent acute distress Mental status: Alert, oriented x 3 (person, place, & time)       Respiratory: No evidence of acute respiratory distress Eyes: PERLA Vitals: BP (!) 120/99 (BP Location: Right Arm, Patient Position: Sitting,  Cuff Size: Normal)   Pulse 82   Temp (!) 96.6 F (35.9 C) (Temporal)   Resp 16   Ht '5\' 6"'  (1.676 m)   Wt 245 lb (111.1 kg)   SpO2 100%   BMI 39.54 kg/m  BMI: Estimated body mass index is 39.54 kg/m as calculated from the following:   Height as of this encounter: '5\' 6"'  (1.676 m).   Weight as of this encounter: 245 lb (111.1 kg). Ideal: Ideal body weight: 59.3 kg (130 lb 11.7 oz) Adjusted ideal body weight: 80 kg (176 lb 7 oz)  Assessment   Status Diagnosis  Controlled Controlled Controlled 1. Chronic pain syndrome   2. Chronic low back pain (1ry area of Pain) (Bilateral) (L>R) w/ sciatica (Bilateral)   3. Lumbar spinal stenosis (5 mm Severe L3-4; 8 mm L4-5) w/ neurogenic claudication   4. Lumbar lateral recess stenosis (Bilateral) (L2-3, L3-4, L4-5) (Severe: L3-4)   5. Lumbar Grade 1 Anterolisthesis of L3/L4 and L4/L5   6. 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 Lumbar facet syndrome (Bilateral) (L>R)   9. Abnormal MRI, lumbar spine (12/26/2019)   10. Chronic radicular pain of lower extremity (Bilateral)   11. DDD (degenerative disc disease), lumbosacral   12.  Pharmacologic therapy   13. Chronic use of opiate for therapeutic purpose   14. Encounter for medication management      Updated Problems: No problems updated.  Plan of Care  Problem-specific:  No problem-specific Assessment & Plan notes found for this encounter.  Ms. Natoya Viscomi has a current medication list which includes the following long-term medication(s): albuterol, azelastine, chlorthalidone, duloxetine, flovent diskus, infliximab, levothyroxine, levothyroxine, loratadine, metoprolol succinate, [START ON 09/11/2020] oxycodone, spironolactone, zolpidem, [START ON 10/11/2020] oxycodone, and [START ON 11/10/2020] oxycodone.  Pharmacotherapy (Medications Ordered): Meds ordered this encounter  Medications   oxyCODONE (OXY IR/ROXICODONE)  5 MG immediate release tablet    Sig: Take 1 tablet (5 mg total) by mouth every 6 (six) hours as needed for severe pain. Must last 30 days    Dispense:  120 tablet    Refill:  0    Not a duplicate. Do NOT delete! Dispense 1 day early if closed on fill date. Warn not to take CNS-depressants 8 hours before or after taking opioid. Do not send refill request. Renewal requires appointment.   oxyCODONE (OXY IR/ROXICODONE) 5 MG immediate release tablet    Sig: Take 1 tablet (5 mg total) by mouth every 6 (six) hours as needed for severe pain. Must last 30 days    Dispense:  120 tablet    Refill:  0    Not a duplicate. Do NOT delete! Dispense 1 day early if closed on fill date. Warn not to take CNS-depressants 8 hours before or after taking opioid. Do not send refill request. Renewal requires appointment.    Orders:  No orders of the defined types were placed in this encounter.  Follow-up plan:   Return in about 4 months (around 12/10/2020) for (F2F-MM) E/M-day (M,W).     Interventional Therapies  Risk  Complexity Considerations:   Estimated body mass index is 41.16 kg/m as calculated from the following:   Height as of this encounter: '5\' 6"'  (1.676 m).   Weight as of this encounter: 255 lb (115.7 kg). PLAQUENIL Anticoagulation (Stop: 11 days)   Planned  Pending:   Therapeutic midline L2-3 LESI #2 (05/28/2020)    Under consideration:   Diagnostic bilateral genicular NB  Diagnostic bilateral L3 and/or L5 TFESI    Completed:   Therapeutic right IA steroid knee injection x2 (10/15/2015)  Therapeutic right Hyalgan knee injection x1 (09/01/2016)  Therapeutic left lumbar facet block x1 (01/22/2015)  Therapeutic left IA hip injection x1 (03/17/2015)  Therapeutic left L4-5 LESI x1 (02/04/2020)  Therapeutic midline L2-3 LESI x1 (04/14/2020)    Therapeutic  Palliative (PRN) options:   Therapeutic right IA Hyalgan knee injections Palliative left IA hip joint injection  Diagnostic left lumbar facet  block #2        Recent Visits Date Type Provider Dept  07/22/20 Telemedicine Milinda Pointer, MD [facility]  07/07/20 Procedure visit Milinda Pointer, MD [facility]  06/16/20 Telemedicine Milinda Pointer, MD [facility]  05/28/20 Procedure visit Milinda Pointer, MD [facility]  Showing recent visits within past 90 days and meeting all other requirements Today's Visits Date Type Provider Dept  08/18/20 Procedure visit Milinda Pointer, MD [facility]  Showing today's visits and meeting all other requirements Future Appointments Date Type Provider Dept  10/07/20 Appointment Milinda Pointer, MD [facility]  Showing future appointments within next 90 days and meeting all other requirements I discussed the assessment and treatment plan with the patient. The patient was provided an  opportunity to ask questions and all were answered. The patient agreed with the plan and demonstrated an understanding of the instructions.  Patient advised to call back or seek an in-person evaluation if the symptoms or condition worsens.  Duration of encounter: 30 minutes.  Note by: Gaspar Cola, MD Date: 08/18/2020; Time: 9:54 AM

## 2020-08-18 ENCOUNTER — Ambulatory Visit: Payer: Medicare Other | Attending: Pain Medicine | Admitting: Pain Medicine

## 2020-08-18 ENCOUNTER — Other Ambulatory Visit: Payer: Self-pay

## 2020-08-18 ENCOUNTER — Encounter: Payer: Self-pay | Admitting: Pain Medicine

## 2020-08-18 VITALS — BP 120/99 | HR 82 | Temp 96.6°F | Resp 16 | Ht 66.0 in | Wt 245.0 lb

## 2020-08-18 DIAGNOSIS — R937 Abnormal findings on diagnostic imaging of other parts of musculoskeletal system: Secondary | ICD-10-CM | POA: Diagnosis present

## 2020-08-18 DIAGNOSIS — M5410 Radiculopathy, site unspecified: Secondary | ICD-10-CM | POA: Diagnosis present

## 2020-08-18 DIAGNOSIS — G8929 Other chronic pain: Secondary | ICD-10-CM | POA: Diagnosis present

## 2020-08-18 DIAGNOSIS — M5137 Other intervertebral disc degeneration, lumbosacral region: Secondary | ICD-10-CM

## 2020-08-18 DIAGNOSIS — G894 Chronic pain syndrome: Secondary | ICD-10-CM | POA: Diagnosis present

## 2020-08-18 DIAGNOSIS — M48061 Spinal stenosis, lumbar region without neurogenic claudication: Secondary | ICD-10-CM

## 2020-08-18 DIAGNOSIS — M5442 Lumbago with sciatica, left side: Secondary | ICD-10-CM

## 2020-08-18 DIAGNOSIS — M2428 Disorder of ligament, vertebrae: Secondary | ICD-10-CM | POA: Diagnosis present

## 2020-08-18 DIAGNOSIS — M4316 Spondylolisthesis, lumbar region: Secondary | ICD-10-CM | POA: Diagnosis present

## 2020-08-18 DIAGNOSIS — M48062 Spinal stenosis, lumbar region with neurogenic claudication: Secondary | ICD-10-CM | POA: Diagnosis present

## 2020-08-18 DIAGNOSIS — M5441 Lumbago with sciatica, right side: Secondary | ICD-10-CM | POA: Diagnosis present

## 2020-08-18 DIAGNOSIS — Z79899 Other long term (current) drug therapy: Secondary | ICD-10-CM

## 2020-08-18 DIAGNOSIS — M47816 Spondylosis without myelopathy or radiculopathy, lumbar region: Secondary | ICD-10-CM | POA: Diagnosis present

## 2020-08-18 DIAGNOSIS — Z79891 Long term (current) use of opiate analgesic: Secondary | ICD-10-CM | POA: Diagnosis present

## 2020-08-18 DIAGNOSIS — I639 Cerebral infarction, unspecified: Secondary | ICD-10-CM

## 2020-08-18 MED ORDER — SODIUM CHLORIDE (PF) 0.9 % IJ SOLN
INTRAMUSCULAR | Status: AC
  Filled 2020-08-18: qty 10

## 2020-08-18 MED ORDER — TRIAMCINOLONE ACETONIDE 40 MG/ML IJ SUSP
INTRAMUSCULAR | Status: AC
  Filled 2020-08-18: qty 1

## 2020-08-18 MED ORDER — OXYCODONE HCL 5 MG PO TABS: 5.0000 mg | ORAL_TABLET | Freq: Four times a day (QID) | ORAL | 0 refills | Status: DC | PRN

## 2020-08-18 MED ORDER — LIDOCAINE HCL (PF) 2 % IJ SOLN
INTRAMUSCULAR | Status: AC
  Filled 2020-08-18: qty 15

## 2020-08-18 MED ORDER — ROPIVACAINE HCL 2 MG/ML IJ SOLN
INTRAMUSCULAR | Status: AC
  Filled 2020-08-18: qty 20

## 2020-08-18 NOTE — Progress Notes (Signed)
Safety precautions to be maintained throughout the outpatient stay will include: orient to surroundings, keep bed in low position, maintain call bell within reach at all times, provide assistance with transfer out of bed and ambulation.  

## 2020-08-25 ENCOUNTER — Telehealth: Payer: Self-pay

## 2020-08-25 NOTE — Telephone Encounter (Signed)
Could you transfer Dr Elvera Maria orders to that pharmacy? thanks

## 2020-08-25 NOTE — Telephone Encounter (Signed)
pt called left message that she changed pharmacy to [facility] and that she needs a rx sent to them for her medications.

## 2020-08-30 ENCOUNTER — Other Ambulatory Visit: Payer: Self-pay | Admitting: Psychiatry

## 2020-08-30 DIAGNOSIS — F5101 Primary insomnia: Secondary | ICD-10-CM

## 2020-09-02 ENCOUNTER — Telehealth: Payer: Self-pay

## 2020-09-02 DIAGNOSIS — F5101 Primary insomnia: Secondary | ICD-10-CM

## 2020-09-02 MED ORDER — HYDROXYZINE PAMOATE 25 MG PO CAPS: 25.0000 mg | ORAL_CAPSULE | Freq: Every evening | ORAL | 1 refills | Status: DC | PRN

## 2020-09-02 NOTE — Telephone Encounter (Signed)
I have sent hydroxyzine to [facility].

## 2020-09-02 NOTE — Telephone Encounter (Signed)
pt called states that [facility] not using [facility] any more that she needs to use [facility] and 90 day supply.  can you send her vistaril

## 2020-09-12 ENCOUNTER — Other Ambulatory Visit: Payer: Self-pay | Admitting: Psychiatry

## 2020-09-12 DIAGNOSIS — F5101 Primary insomnia: Secondary | ICD-10-CM

## 2020-09-17 ENCOUNTER — Ambulatory Visit
Admission: RE | Admit: 2020-09-17 | Discharge: 2020-09-17 | Disposition: A | Discharge status: Home / Self Care | Payer: Medicare Other | Source: Ambulatory Visit | Attending: Internal Medicine | Admitting: Internal Medicine

## 2020-09-17 ENCOUNTER — Other Ambulatory Visit: Payer: Self-pay

## 2020-09-17 DIAGNOSIS — Z1231 Encounter for screening mammogram for malignant neoplasm of breast: Secondary | ICD-10-CM | POA: Diagnosis present

## 2020-09-17 IMAGING — MG MM DIGITAL SCREENING BILAT W/ TOMO AND CAD
6 of 10 series · 6 of 30 positions shown · non-contrast
Comparison: Previous exam(s).

CLINICAL DATA: Screening.

EXAM:
DIGITAL SCREENING BILATERAL MAMMOGRAM WITH TOMOSYNTHESIS AND CAD
TECHNIQUE: Bilateral screening digital craniocaudal and mediolateral oblique
mammograms were obtained. Bilateral screening digital breast
tomosynthesis was performed. The images were evaluated with
computer-aided detection.

[L MLO synth-2D (1 of 2)]
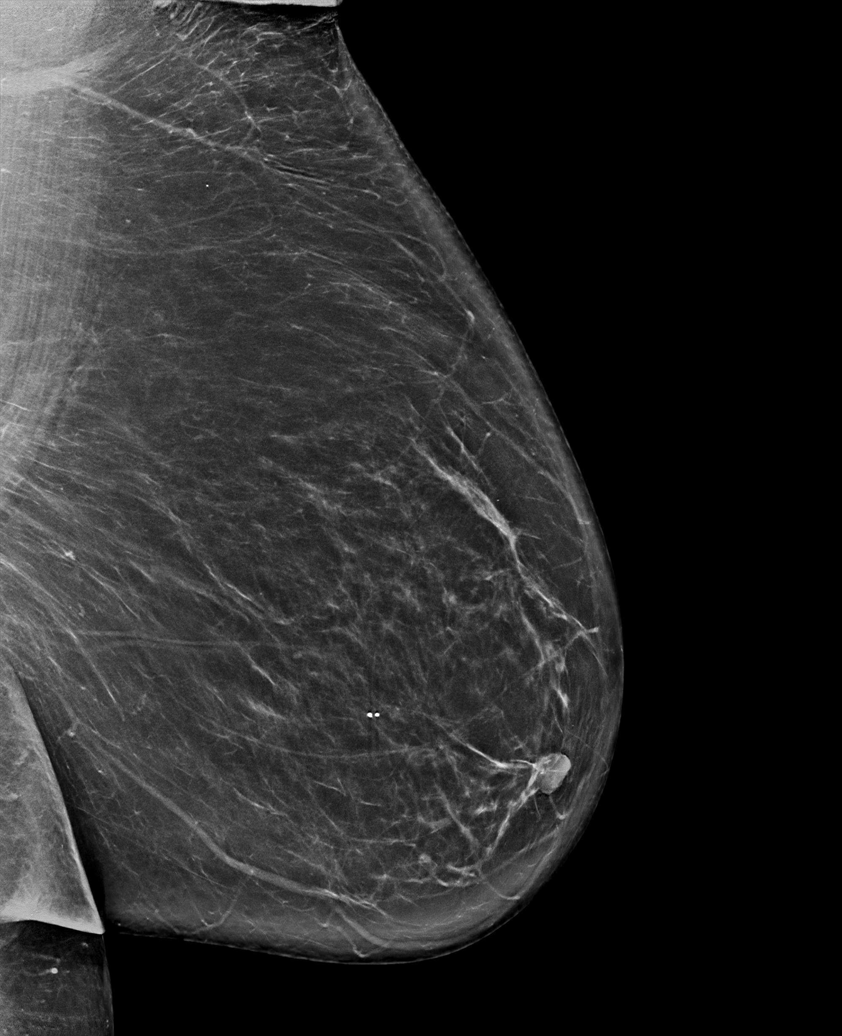

[R MLO synth-2D]
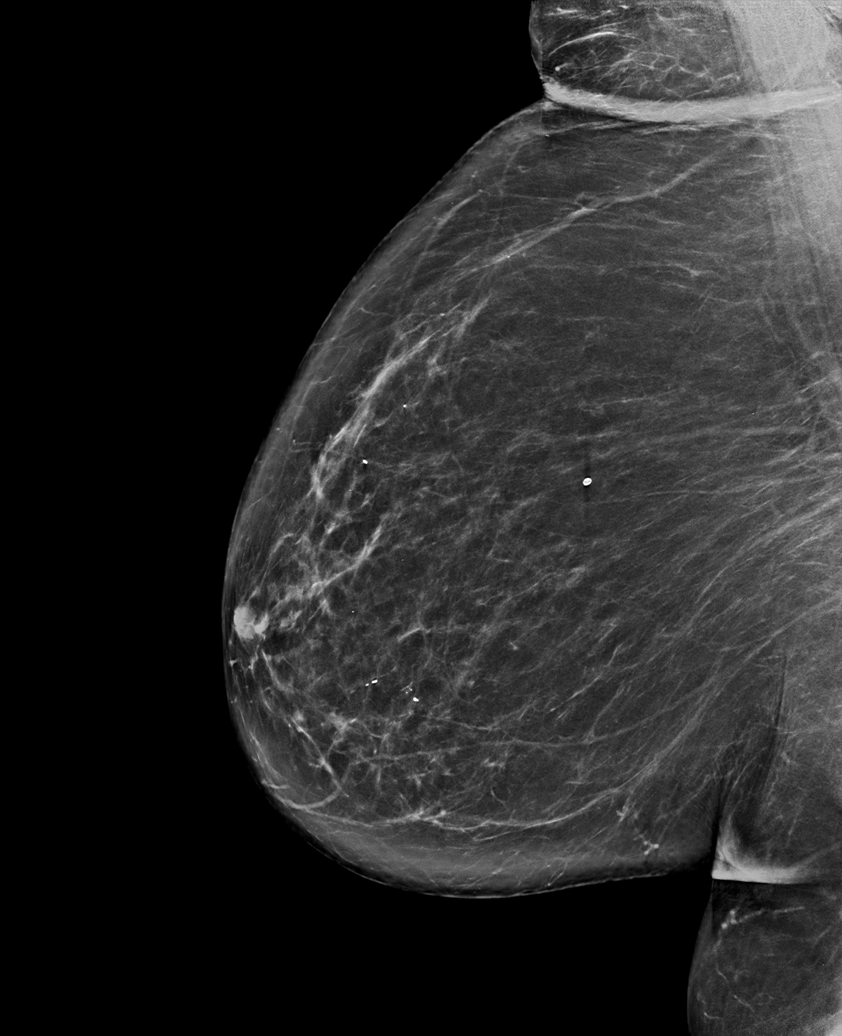

[L MLO synth-2D (2 of 2)]
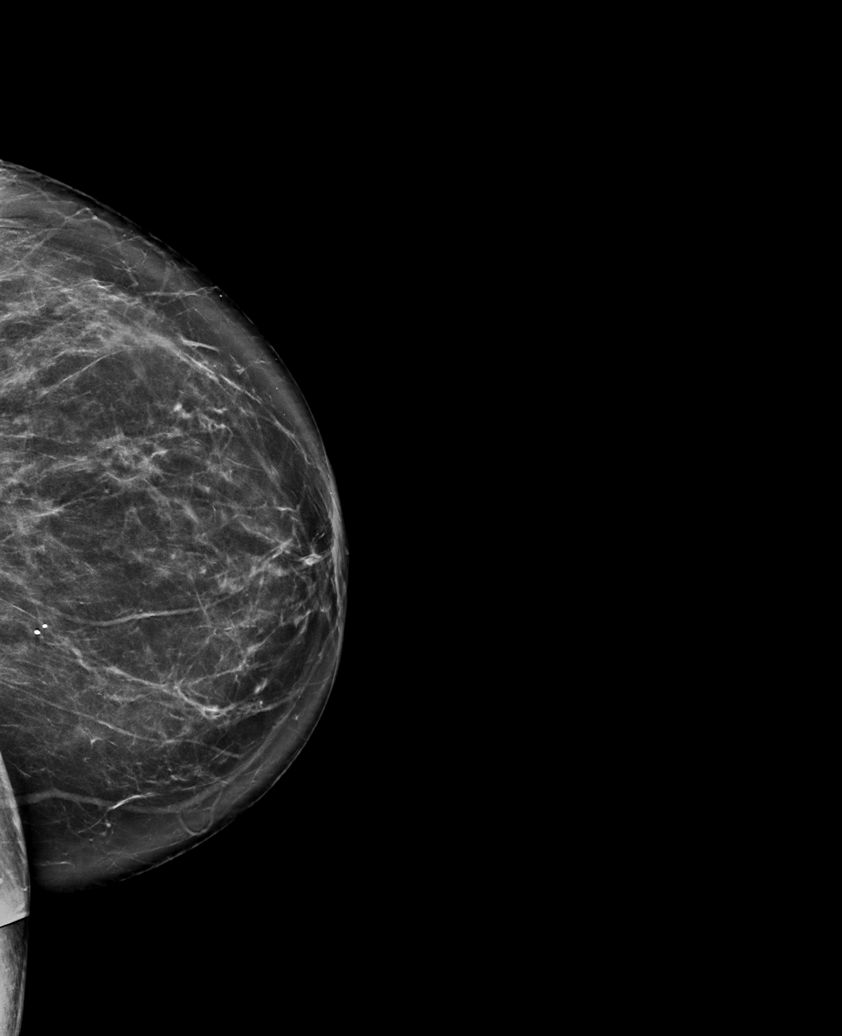

[R CC synth-2D]
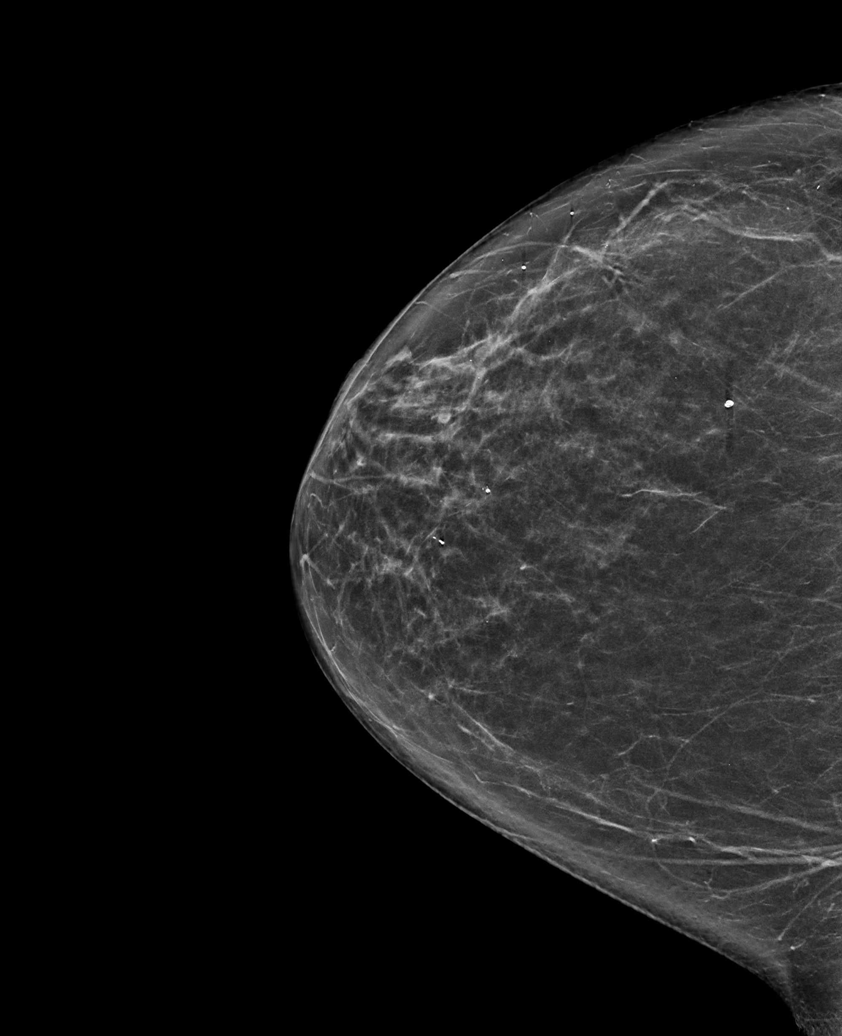

[L CC synth-2D]
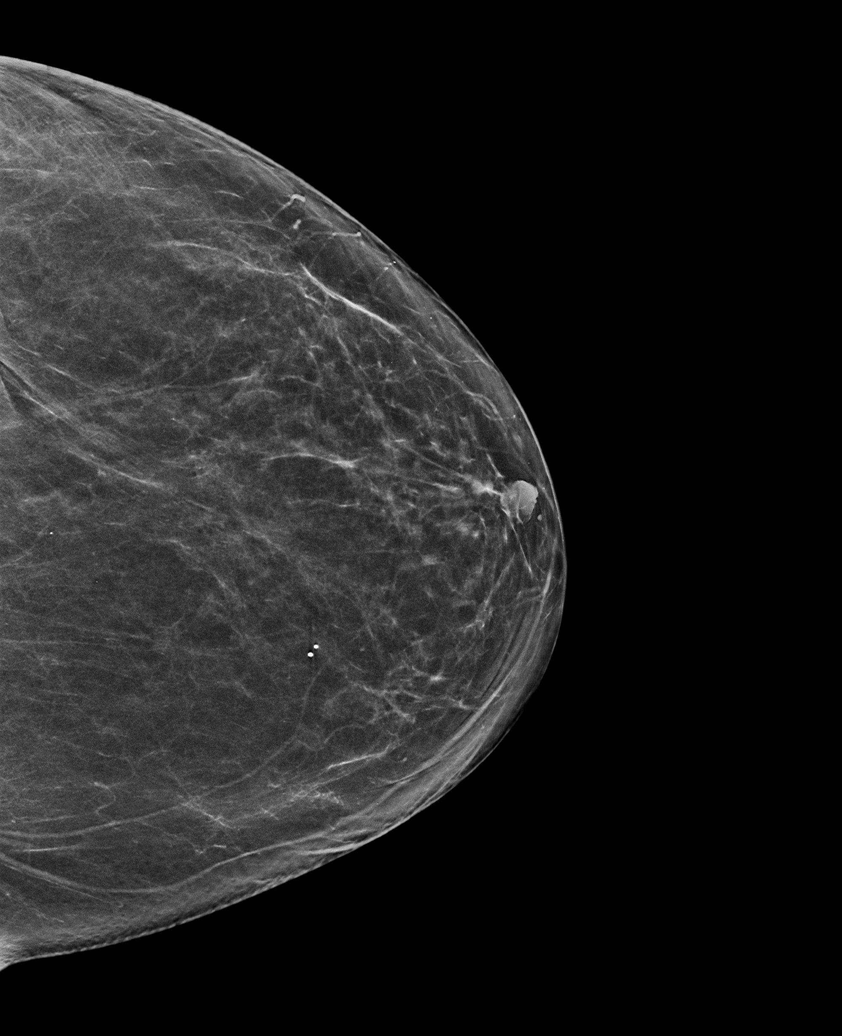

[R CC tomo · tomo slice 39/76.0]
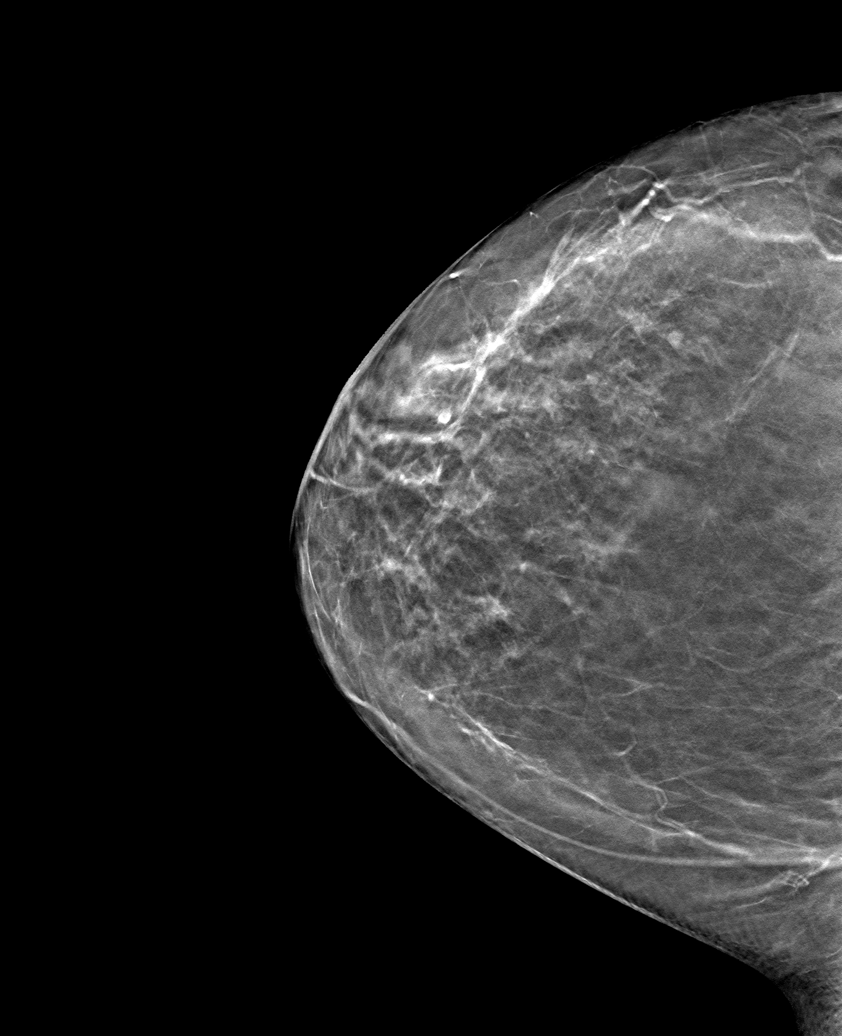

[6 of 30 positions shown; findings below may reference images not displayed]

ACR Breast Density Category b: There are scattered areas of
fibroglandular density.
FINDINGS: There are no findings suspicious for malignancy.
IMPRESSION: No mammographic evidence of malignancy. A result letter of this
screening mammogram will be mailed directly to the patient.

RECOMMENDATION:
Screening mammogram in one year. (Code:[BY])

BI-RADS CATEGORY  1: Negative.

## 2020-09-24 ENCOUNTER — Telehealth: Payer: Self-pay

## 2020-09-24 NOTE — Telephone Encounter (Signed)
OK please let me know when you find out.

## 2020-09-24 NOTE — Telephone Encounter (Signed)
pt left a message wanted to know when it be a times to take her oxycodone and vistaril.

## 2020-09-24 NOTE — Telephone Encounter (Signed)
the home phone was busy so i called mobile and had to leave a message.

## 2020-09-28 ENCOUNTER — Encounter: Payer: Self-pay | Admitting: Psychiatry

## 2020-09-28 ENCOUNTER — Other Ambulatory Visit: Payer: Self-pay

## 2020-09-28 ENCOUNTER — Telehealth (INDEPENDENT_AMBULATORY_CARE_PROVIDER_SITE_OTHER): Payer: Medicare Other | Admitting: Psychiatry

## 2020-09-28 DIAGNOSIS — F5101 Primary insomnia: Secondary | ICD-10-CM

## 2020-09-28 DIAGNOSIS — F3342 Major depressive disorder, recurrent, in full remission: Secondary | ICD-10-CM | POA: Diagnosis not present

## 2020-09-28 NOTE — Progress Notes (Signed)
Virtual Visit via Telephone Note  I connected with Laura [name] on 09/28/20 at  2:00 PM EDT by telephone and verified that I am speaking with the correct person using two identifiers.  Location Provider Location : [facility] Patient Location : Home  Participants: Patient , Provider   I discussed the limitations, risks, security and privacy concerns of performing an evaluation and management service by telephone and the availability of in person appointments. I also discussed with the patient that there may be a patient responsible charge related to this service. The patient expressed understanding and agreed to proceed.   I discussed the assessment and treatment plan with the patient. The patient was provided an opportunity to ask questions and all were answered. The patient agreed with the plan and demonstrated an understanding of the instructions.   The patient was advised to call back or seek an in-person evaluation if the symptoms worsen or if the condition fails to improve as anticipated.   [facility] MD OP Progress Note  09/28/2020 2:15 PM Laura [name]  MRN:  [id number]  Chief Complaint:  Chief Complaint   Follow-up; Anxiety; Insomnia    HPI: Laura [name] is a 75 year old Caucasian female who has a history of MDD, primary insomnia, chronic pain was evaluated by telemedicine today.  Patient continues to be in a lot of pain, currently on prednisone for her rheumatoid arthritis.  Patient reports sleep is overall okay on the combination of Ambien as well as hydroxyzine.  She reports she has been trying to take the pain medication during the day and just take her sleep medications at night that way she is not taking them together to avoid side effects.  So far she is tolerating it okay.  Denies any significant depression or anxiety symptoms.  She is compliant on the Cymbalta.  Patient denies any suicidality, homicidality or perceptual disturbances.  Patient denies any other  concerns today.  Visit Diagnosis:    ICD-10-CM   1. MDD (major depressive disorder), recurrent, in full remission (HCC)  F33.42     2. Primary insomnia  F51.01       Past Psychiatric History: Reviewed past psychiatric history from progress note on 06/13/2018  Past Medical History:  Past Medical History:  Diagnosis Date   Allergy    Anxiety    Arthritis, degenerative 10/08/2013   Overview:    a.  Lumbar spine/spinal stenosis/foot drop.   b.  Hands.    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   Memory loss, short term 03/19/2014   Sciatica of left side 12/31/2019   Seizure (HCC) 10/07/2014   Sleep apnea    Sleep apnea    Thyroid disease     Past Surgical History:  Procedure Laterality Date   ABDOMINAL HYSTERECTOMY     CESAREAN SECTION     HIP SURGERY Right    REPLACEMENT TOTAL KNEE Left     Family Psychiatric History: Reviewed family psychiatric history from progress note on 06/13/2018  Family History:  Family History  Problem Relation Age of Onset   Hypertension Mother    Stroke Mother    Heart attack Father    Alcohol abuse Father    Depression Father    Post-traumatic stress disorder Father    Rheum arthritis Sister    Hypertension Sister  Depression Sister    Breast cancer Neg Hx     Social History: Reviewed social history from progress note on 06/13/2018 Social History   Socioeconomic History   Marital status: Married    Spouse name: Not on file   Number of children: Not on file   Years of education: Not on file   Highest education level: Not on file  Occupational History    Comment: retired  Tobacco Use   Smoking status: Never   Smokeless tobacco: Never  Vaping Use   Vaping Use: Never used  Substance and Sexual Activity   Alcohol use: No    Alcohol/week: 0.0 standard  drinks   Drug use: No   Sexual activity: Not Currently  Other Topics Concern   Not on file  Social History Narrative   Not on file   Social Determinants of Health   Financial Resource Strain: Not on file  Food Insecurity: Not on file  Transportation Needs: Not on file  Physical Activity: Not on file  Stress: Not on file  Social Connections: Not on file    Allergies:  Allergies  Allergen Reactions   Bupropion    Penicillins Rash    Metabolic Disorder Labs: No results found for: HGBA1C, MPG No results found for: PROLACTIN No results found for: CHOL, TRIG, HDL, CHOLHDL, VLDL, LDLCALC Lab Results  Component Value Date   TSH 0.383 10/07/2018    Therapeutic Level Labs: No results found for: LITHIUM No results found for: VALPROATE No components found for:  CBMZ  Current Medications: Current Outpatient Medications  Medication Sig Dispense Refill   albuterol (VENTOLIN HFA) 108 (90 Base) MCG/ACT inhaler Inhale 2 puffs into the lungs every 6 (six) hours as needed for wheezing or shortness of breath. 18 g 3   aspirin EC 81 MG tablet Take by mouth.     azelastine (ASTELIN) 0.1 % nasal spray Place into the nose.     chlorthalidone (HYGROTON) 25 MG tablet TAKE 1/2 TABLET (12.5MG ) BY MOUTH EVERY DAY  11   Cholecalciferol (VITAMIN D) 2000 UNITS tablet Take by mouth daily.      doxycycline (MONODOX) 100 MG capsule Take 100 mg by mouth 2 (two) times daily.     DULoxetine (CYMBALTA) 60 MG capsule Take 1 capsule (60 mg total) by mouth daily. 90 capsule 1   empagliflozin (JARDIANCE) 10 MG TABS tablet Take 1 tablet by mouth daily.     FLOVENT DISKUS 100 MCG/BLIST AEPB SMARTSIG:1 Inhalation Via Inhaler Twice Daily     folic acid (FOLVITE) 1 MG tablet Take 1 mg by mouth daily.     hydroxychloroquine (PLAQUENIL) 200 MG tablet Take 100 mg by mouth daily.      hydrOXYzine (VISTARIL) 25 MG capsule Take 1 capsule (25 mg total) by mouth at bedtime as needed. For sleep 90 capsule 1   inFLIXimab  (REMICADE) 100 MG injection Inject 100 mg into the vein every 8 (eight) weeks.     levothyroxine (SYNTHROID) 50 MCG tablet Take 25 mcg by mouth at bedtime.     levothyroxine (SYNTHROID, LEVOTHROID) 200 MCG tablet 200 mcg daily before breakfast.  5   loratadine (CLARITIN) 10 MG tablet Take 10 mg by mouth daily.      methotrexate (RHEUMATREX) 2.5 MG tablet TAKE 4 TABLETS (10 MG TOTAL) BY MOUTH EVERY 7 (SEVEN) DAYS WITH A MEAL  5   metoprolol succinate (TOPROL-XL) 100 MG 24 hr tablet Take 1 tablet (100 mg total) by mouth daily. Take with or  immediately following a meal. 30 tablet 0   Multiple Vitamin (MULTI-VITAMINS) TABS Take by mouth.     oxyCODONE (OXY IR/ROXICODONE) 5 MG immediate release tablet Take 1 tablet (5 mg total) by mouth every 6 (six) hours as needed for severe pain. Must last 30 days 120 tablet 0   [START ON 10/11/2020] oxyCODONE (OXY IR/ROXICODONE) 5 MG immediate release tablet Take 1 tablet (5 mg total) by mouth every 6 (six) hours as needed for severe pain. Must last 30 days 120 tablet 0   [START ON 11/10/2020] oxyCODONE (OXY IR/ROXICODONE) 5 MG immediate release tablet Take 1 tablet (5 mg total) by mouth every 6 (six) hours as needed for severe pain. Must last 30 days 120 tablet 0   predniSONE (STERAPRED UNI-PAK 21 TAB) 5 MG (21) TBPK tablet Take by mouth.     rosuvastatin (CRESTOR) 5 MG tablet Take 5 mg by mouth 2 (two) times a week.     spironolactone (ALDACTONE) 25 MG tablet TAKE 1/2 (ONE-HALF) TABLET BY MOUTH DAILY  11   SYNTHROID 125 MCG tablet Take by mouth.     valACYclovir (VALTREX) 1000 MG tablet PLEASE SEE ATTACHED FOR DETAILED DIRECTIONS     zolpidem (AMBIEN) 10 MG tablet TAKE 0.5-1 TABLETS (5-10 MG TOTAL) BY MOUTH AT BEDTIME AS NEEDED FOR SLEEP. 30 tablet 1   No current facility-administered medications for this visit.     Musculoskeletal: Strength & Muscle Tone:  UTA Gait & Station:  UTA Patient leans: N/A  Psychiatric Specialty Exam: Review of Systems   Musculoskeletal:  Positive for arthralgias and back pain.  Psychiatric/Behavioral:  Negative for agitation, behavioral problems, confusion, decreased concentration, dysphoric mood, hallucinations, self-injury, sleep disturbance and suicidal ideas. The patient is not nervous/anxious and is not hyperactive.   All other systems reviewed and are negative.  There were no vitals taken for this visit.There is no height or weight on file to calculate BMI.  General Appearance: UTA  Eye Contact:   UTA  Speech:  Clear and Coherent  Volume:  Normal  Mood:   UTA  Affect:   UTA  Thought Process:  Goal Directed and Descriptions of Associations: Intact  Orientation:  Full (Time, Place, and Person)  Thought Content: Logical   Suicidal Thoughts:   UTA  Homicidal Thoughts:   UTA  Memory:  Immediate;   Fair Recent;   Fair Remote;   limited  Judgement:  Fair  Insight:  Fair  Psychomotor Activity:   UTA  Concentration:  Concentration: Fair and Attention Span: Fair  Recall:  [employer] of Knowledge: Fair  Language: Fair  Akathisia:  No  Handed:  Right  AIMS (if indicated): not done  Assets:  Communication Skills Desire for Improvement Housing Intimacy Social Support  ADL's:  Intact  Cognition: WNL  Sleep:  Fair   Screenings: AIMS    Flowsheet Row Office Visit from 04/28/2020 in [facility]  AIMS Total Score 0      PHQ2-9    Flowsheet Row Procedure visit from 07/07/2020 in [facility] Procedure visit from 05/28/2020 in [facility] Office Visit from 04[facility]28/2020 in [facility] Video Visit from 03/24/2020 in [facility] Video Visit from 02/19/2020 in [facility]  PHQ-2 Total Score 0 0 2 0 0  PHQ-9 Total Score -- -- 5 -- --      Flowsheet Row Office Visit from 04/28/2020 in [facility] Video Visit from 03/24/2020 in [facility] Video Visit from 02/19/2020 in [facility]  C-SSRS RISK CATEGORY No Risk No Risk No Risk        Assessment and Plan: Laura [name] is a 75 year old Caucasian female, married, retired, lives in [place], has a history of depression, multiple medical problems, chronic pain, rheumatoid arthritis was evaluated by telemedicine today.  Patient is currently sleeping on the current combination of medication and reports mood symptoms are stable.  Plan MDD in remission Cymbalta 60 mg p.o. daily-reduced dosage  Primary insomnia-stable Ambien 10 mg p.o. nightly Hydroxyzine 25 mg p.o. nightly as needed Discussed with patient to consult with pain provider regarding combining hydroxyzine and Ambien with her oxycodone, discussed drug to drug interaction with these medications.  Provided medication education.  Collateral information obtained from husband who reports patient is doing well.  Follow-up in clinic in 3 months in person.   I have spent at least 24 minutes non face to face with patient today .    This note was generated in part or whole with voice recognition software. Voice recognition is usually quite accurate but there are transcription errors that can and very often do occur. I apologize for any typographical errors that were not detected and corrected.       Jomarie Longs, MD 09/29/2020, 5:19 PM

## 2020-10-05 ENCOUNTER — Telehealth: Payer: Self-pay

## 2020-10-05 DIAGNOSIS — F5101 Primary insomnia: Secondary | ICD-10-CM

## 2020-10-05 MED ORDER — LEMBOREXANT 5 MG PO TABS: 5.0000 mg | ORAL_TABLET | Freq: Every day | ORAL | 1 refills | Status: DC

## 2020-10-05 NOTE — Telephone Encounter (Signed)
pt called left message that the sleeping pill has stopped working.

## 2020-10-05 NOTE — Telephone Encounter (Signed)
I have sent lemborexant 5 mg to pharmacy after discussion with patient since her sleep medication Ambien is no longer working

## 2020-10-06 ENCOUNTER — Telehealth: Payer: Self-pay

## 2020-10-06 DIAGNOSIS — F5101 Primary insomnia: Secondary | ICD-10-CM

## 2020-10-06 MED ORDER — HYDROXYZINE PAMOATE 25 MG PO CAPS: 75.0000 mg | ORAL_CAPSULE | Freq: Every evening | ORAL | 1 refills | Status: DC | PRN

## 2020-10-06 NOTE — Telephone Encounter (Signed)
pt called states she can not afford the sleep medication you order it over a $100

## 2020-10-06 NOTE — Telephone Encounter (Signed)
Patient was tried on several sleep medications and failed .  Patient agreeable to be referred to [facility] sleep specialist as per our discussion yesterday.  I will have Shanda Bumps CMA  refer this patient.  Please let patient know

## 2020-10-06 NOTE — Telephone Encounter (Signed)
Returned call to patient.  Discussed with patient to restart Ambien since Lemborexant is expensive. Increase hydroxyzine to 75 mg at bedtime.  I have started the referral process to [facility] for further management.  This was discussed with patient.

## 2020-10-07 ENCOUNTER — Encounter: Payer: BC Managed Care – PPO | Admitting: Pain Medicine

## 2020-11-14 ENCOUNTER — Other Ambulatory Visit: Payer: Self-pay

## 2020-11-14 ENCOUNTER — Emergency Department
Admission: EM | Admit: 2020-11-14 | Discharge: 2020-11-14 | Disposition: A | Discharge status: Home / Self Care | Payer: Medicare Other | Attending: Emergency Medicine | Admitting: Emergency Medicine

## 2020-11-14 ENCOUNTER — Encounter: Payer: Self-pay | Admitting: Emergency Medicine

## 2020-11-14 DIAGNOSIS — E860 Dehydration: Secondary | ICD-10-CM | POA: Diagnosis not present

## 2020-11-14 DIAGNOSIS — M06042 Rheumatoid arthritis without rheumatoid factor, left hand: Secondary | ICD-10-CM

## 2020-11-14 DIAGNOSIS — J45909 Unspecified asthma, uncomplicated: Secondary | ICD-10-CM | POA: Insufficient documentation

## 2020-11-14 DIAGNOSIS — Z7982 Long term (current) use of aspirin: Secondary | ICD-10-CM | POA: Diagnosis not present

## 2020-11-14 DIAGNOSIS — T887XXA Unspecified adverse effect of drug or medicament, initial encounter: Secondary | ICD-10-CM | POA: Insufficient documentation

## 2020-11-14 DIAGNOSIS — M0689 Other specified rheumatoid arthritis, multiple sites: Secondary | ICD-10-CM | POA: Insufficient documentation

## 2020-11-14 DIAGNOSIS — I5020 Unspecified systolic (congestive) heart failure: Secondary | ICD-10-CM | POA: Insufficient documentation

## 2020-11-14 DIAGNOSIS — E1122 Type 2 diabetes mellitus with diabetic chronic kidney disease: Secondary | ICD-10-CM | POA: Insufficient documentation

## 2020-11-14 DIAGNOSIS — Z7951 Long term (current) use of inhaled steroids: Secondary | ICD-10-CM | POA: Diagnosis not present

## 2020-11-14 DIAGNOSIS — M06041 Rheumatoid arthritis without rheumatoid factor, right hand: Secondary | ICD-10-CM

## 2020-11-14 DIAGNOSIS — R42 Dizziness and giddiness: Secondary | ICD-10-CM | POA: Diagnosis present

## 2020-11-14 DIAGNOSIS — I130 Hypertensive heart and chronic kidney disease with heart failure and stage 1 through stage 4 chronic kidney disease, or unspecified chronic kidney disease: Secondary | ICD-10-CM | POA: Insufficient documentation

## 2020-11-14 DIAGNOSIS — Z96652 Presence of left artificial knee joint: Secondary | ICD-10-CM | POA: Diagnosis not present

## 2020-11-14 DIAGNOSIS — N183 Chronic kidney disease, stage 3 unspecified: Secondary | ICD-10-CM | POA: Insufficient documentation

## 2020-11-14 DIAGNOSIS — Z79899 Other long term (current) drug therapy: Secondary | ICD-10-CM | POA: Diagnosis not present

## 2020-11-14 DIAGNOSIS — R296 Repeated falls: Secondary | ICD-10-CM | POA: Diagnosis not present

## 2020-11-14 DIAGNOSIS — E039 Hypothyroidism, unspecified: Secondary | ICD-10-CM | POA: Diagnosis not present

## 2020-11-14 DIAGNOSIS — I2510 Atherosclerotic heart disease of native coronary artery without angina pectoris: Secondary | ICD-10-CM | POA: Insufficient documentation

## 2020-11-14 LAB — CBC WITH DIFFERENTIAL/PLATELET
Abs Immature Granulocytes: 0.03 10*3/uL (ref 0.00–0.07)
Basophils Absolute: 0.1 10*3/uL (ref 0.0–0.1)
Basophils Relative: 1 %
Eosinophils Absolute: 0.2 10*3/uL (ref 0.0–0.5)
Eosinophils Relative: 2 %
HCT: 45.5 % (ref 36.0–46.0)
Hemoglobin: 15.2 g/dL — ABNORMAL HIGH (ref 12.0–15.0)
Immature Granulocytes: 0 %
Lymphocytes Relative: 39 %
Lymphs Abs: 3.5 10*3/uL (ref 0.7–4.0)
MCH: 30.3 pg (ref 26.0–34.0)
MCHC: 33.4 g/dL (ref 30.0–36.0)
MCV: 90.8 fL (ref 80.0–100.0)
Monocytes Absolute: 0.6 10*3/uL (ref 0.1–1.0)
Monocytes Relative: 7 %
Neutro Abs: 4.6 10*3/uL (ref 1.7–7.7)
Neutrophils Relative %: 51 %
Platelets: 273 10*3/uL (ref 150–400)
RBC: 5.01 MIL/uL (ref 3.87–5.11)
RDW: 14.1 % (ref 11.5–15.5)
WBC: 9 10*3/uL (ref 4.0–10.5)
nRBC: 0 % (ref 0.0–0.2)

## 2020-11-14 LAB — COMPREHENSIVE METABOLIC PANEL
ALT: 15 U/L (ref 0–44)
AST: 20 U/L (ref 15–41)
Albumin: 4.1 g/dL (ref 3.5–5.0)
Alkaline Phosphatase: 43 U/L (ref 38–126)
Anion gap: 7 (ref 5–15)
BUN: 24 mg/dL — ABNORMAL HIGH (ref 8–23)
CO2: 29 mmol/L (ref 22–32)
Calcium: 9.1 mg/dL (ref 8.9–10.3)
Chloride: 100 mmol/L (ref 98–111)
Creatinine, Ser: 1.3 mg/dL — ABNORMAL HIGH (ref 0.44–1.00)
GFR, Estimated: 43 mL/min — ABNORMAL LOW (ref >60)
Glucose, Bld: 177 mg/dL — ABNORMAL HIGH (ref 70–99)
Potassium: 3.6 mmol/L (ref 3.5–5.1)
Sodium: 136 mmol/L (ref 135–145)
Total Bilirubin: 0.7 mg/dL (ref 0.3–1.2)
Total Protein: 6.8 g/dL (ref 6.5–8.1)

## 2020-11-14 MED ORDER — DOXYCYCLINE HYCLATE 50 MG PO CAPS: 100.0000 mg | ORAL_CAPSULE | Freq: Two times a day (BID) | ORAL | 0 refills | Status: AC

## 2020-11-14 NOTE — ED Provider Notes (Signed)
[facility] Emergency Department Provider Note   ____________________________________________   Event Date/Time   First MD Initiated Contact with Patient 11/14/20 1744     (approximate)  I have reviewed the triage vital signs and the nursing notes.   HISTORY  Chief Complaint Finger Injury and Dizziness    HPI Laura [name] is a 75 y.o. female who presents from urgent care clinic with complaints of of hypotension.  Patient states that she has had intermittent lightheadedness over the last 2 weeks with multiple falls.  Patient states that she is only here for finger injuries over the distal PIP joints of the left hand  LOCATION: Left hand DURATION: 2 weeks prior to arrival TIMING: Worsening since onset SEVERITY: Mild QUALITY: Erythema and pain CONTEXT: Patient states that she has a history of rheumatoid arthritis and occasionally gets painful unroofed nodes at the distal ends of her hands and requires antibiotics for resolution MODIFYING FACTORS: Denies any exacerbating or relieving factors ASSOCIATED SYMPTOMS: Lightheadedness   Per medical record review, patient has history of rheumatoid arthritis and chronic kidney disease          Past Medical History:  Diagnosis Date   Allergy    Anxiety    Arthritis, degenerative 10/08/2013   Overview:    a.  Lumbar spine/spinal stenosis/foot drop.   b.  Hands.    Asthma    Chronic hand pain, left 10/10/2019   Chronic kidney disease    Depression    Lumbar spinal stenosis with neurogenic claudication 11/07/2014   Major depression, single episode, in complete remission ([place]) 06/25/2015   Memory loss, short term 03/19/2014   Sciatica of left side 12/31/2019   Seizure ([place]) 10/07/2014   Sleep apnea    Sleep apnea    Thyroid disease     Patient Active Problem List   Diagnosis Date Noted   Ligamentum flavum hypertrophy (L1-2, L2-3, L3-4) 05/28/2020   Lumbar lateral recess stenosis (Bilateral) (L2-3,  L3-4, L4-5) (Severe: L3-4) 05/28/2020   Lumbar Grade 1 Anterolisthesis of L3/L4 and L4/L5 05/28/2020   Chronic radicular pain of lower extremity (Bilateral) 05/28/2020   Chronic use of opiate for therapeutic purpose 04/14/2020   Statin myopathy 03/20/2020   DDD (degenerative disc disease), lumbosacral 03/10/2020   Abnormal MRI, lumbar spine (12/26/2019) 02/19/2020   Chronic pain of lower extremity (Bilateral) 02/04/2020   Lumbosacral radiculopathy at L5 (Left) 02/04/2020   Chronic anticoagulation ([place]) 01/20/2020   Cerebrovascular accident (CVA) ([place]) 01/02/2020   Sciatica (Left) 12/31/2019   Dropfoot (Left) 11/20/2019   Weakness of foot (Left) 11/20/2019   Weakness of leg (Left) 11/20/2019   Foot drop, left 11/20/2019   Chronic hand pain, left 10/10/2019   MDD (major depressive disorder), recurrent, in full remission ([place]) 11/22/2018   Paroxysmal atrial fibrillation ([place]) 10/16/2018   Coronary artery disease involving native coronary artery of native heart without angina pectoris 10/15/2018   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   Pneumonia 10/06/2018   Immunosuppression ([place]) 09/18/2018   Pharmacologic therapy 07/11/2018   Disorder of skeletal system 07/11/2018   Problems influencing health status 07/11/2018   MDD (major depressive disorder), recurrent episode, mild ([place]) 07/09/2018   Primary insomnia 07/09/2018   Bereavement 07/09/2018   Body mass index (BMI) of 40.0-44.9 in adult ([place]) 10/09/2017   Postmenopausal 04/06/2017   Chronic arthralgias of knees and hips (Right) 08/18/2016   Arthritis 07/11/2016   Dyspnea on exertion 07/11/2016   Kidney disease 07/11/2016   Primary fibromyalgia  syndrome 07/11/2016   Snoring 07/11/2016   Morbid obesity with BMI of 40.0-44.9, adult ([place]) 07/11/2016   Opioid-induced constipation (OIC) 04/21/2016   Osteoarthritis of knee (Right) 01/25/2016   Diet-controlled diabetes mellitus ([place]) 10/03/2015   GAD  (generalized anxiety disorder) 09/24/2015   Disturbance of skin sensation 07/15/2015   Elevated sedimentation rate 07/15/2015   Elevated C-reactive protein (CRP) 07/15/2015   Chronic hip pain (Left) 04/06/2015   History of TKR (total knee replacement) (Left) 02/09/2015   History of femur fracture (Right) 02/09/2015   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) 02/09/2015   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) 02/09/2015   Lumbar foraminal stenosis (Bilateral L3-4 and L5-S1) 02/09/2015   Chronic low back pain (1ry area of Pain) (Bilateral) (L>R) w/ sciatica (Bilateral) 01/22/2015   Chronic pain syndrome [id number]   Uncomplicated opioid dependence ([place]) 11/07/2014   Lumbar spinal stenosis (5 mm Severe L3-4; 8 mm L4-5) w/ neurogenic claudication 11/07/2014   Lumbar spondylosis 11/07/2014   Lumbar facet syndrome (Bilateral) (L>R) 11/07/2014   Chronic knee pain (Right) 11/05/2014   Opiate use (30 MME/Day) 11/05/2014   Long term current use of opiate analgesic 11/05/2014   Long term prescription opiate use 11/05/2014   Encounter for therapeutic drug level monitoring 11/05/2014   Morbid obesity ([place]) 10/27/2014   Extreme obesity ([place]) 10/07/2014   Type 2 diabetes mellitus ([place]) 03/19/2014   Depressive disorder 10/14/2013   Essential (primary) hypertension 10/14/2013   Insomnia secondary to chronic pain 07/09/2013   Anxiety 07/09/2013   GERD (gastroesophageal reflux disease) 01/18/2013   Hypothyroidism 01/18/2013   Hypercholesterolemia 01/18/2013   Seronegative rheumatoid arthritis ([place]) 01/18/2013   CKD (chronic kidney disease) stage 3, GFR 30-59 ml/min (HCC) 01/17/2013   Congestive heart failure with left ventricular systolic dysfunction ([place]) 11/10/2012    Past Surgical History:  Procedure Laterality Date   ABDOMINAL HYSTERECTOMY     CESAREAN SECTION     HIP SURGERY Right    REPLACEMENT TOTAL KNEE Left     Prior to Admission medications   Medication Sig Start Date End Date  Taking? Authorizing Provider  doxycycline (VIBRAMYCIN) 50 MG capsule Take 2 capsules (100 mg total) by mouth 2 (two) times daily for 5 days. 11/14/20 11/19/20 Yes Damascus Feldpausch, Vista Lawman, MD  albuterol (VENTOLIN HFA) 108 (90 Base) MCG/ACT inhaler Inhale 2 puffs into the lungs every 6 (six) hours as needed for wheezing or shortness of breath. 04/30/19   Kendell Bane, NP  aspirin EC 81 MG tablet Take by mouth.    [provider]  azelastine (ASTELIN) 0.1 % nasal spray Place into the nose. 02/26/19   [provider]  chlorthalidone (HYGROTON) 25 MG tablet TAKE 1/2 TABLET (12.5MG ) BY MOUTH EVERY DAY 04/06/16   [provider]  Cholecalciferol (VITAMIN D) 2000 UNITS tablet Take by mouth daily.     [provider]  doxycycline (MONODOX) 100 MG capsule Take 100 mg by mouth 2 (two) times daily. 08/25/20   [provider]  DULoxetine (CYMBALTA) 60 MG capsule Take 1 capsule (60 mg total) by mouth daily. 07/28/20   Ursula Alert, MD  empagliflozin (JARDIANCE) 10 MG TABS tablet Take 1 tablet by mouth daily. 03/20/20 03/20/21  [provider]  FLOVENT DISKUS 100 MCG/BLIST AEPB SMARTSIG:1 Inhalation Via Inhaler Twice Daily 06/30/20   [provider]  folic acid (FOLVITE) 1 MG tablet Take 1 mg by mouth daily.    [provider]  hydroxychloroquine (PLAQUENIL) 200 MG tablet Take 100 mg by mouth  daily.     [provider]  hydrOXYzine (VISTARIL) 25 MG capsule Take 1 capsule (25 mg total) by mouth at bedtime as needed. For sleep 09/02/20   Jomarie Longs, MD  hydrOXYzine (VISTARIL) 25 MG capsule Take 3 capsules (75 mg total) by mouth at bedtime as needed. 10/06/20   Jomarie Longs, MD  inFLIXimab (REMICADE) 100 MG injection Inject 100 mg into the vein every 8 (eight) weeks.    [provider]  levothyroxine (SYNTHROID) 50 MCG tablet Take 25 mcg by mouth at bedtime. 07/09/18   [provider]  levothyroxine (SYNTHROID, LEVOTHROID)  200 MCG tablet 200 mcg daily before breakfast. 09/09/14   [provider]  loratadine (CLARITIN) 10 MG tablet Take 10 mg by mouth daily.  04/05/14   [provider]  methotrexate (RHEUMATREX) 2.5 MG tablet TAKE 4 TABLETS (10 MG TOTAL) BY MOUTH EVERY 7 (SEVEN) DAYS WITH A MEAL 03/15/16   [provider]  metoprolol succinate (TOPROL-XL) 100 MG 24 hr tablet Take 1 tablet (100 mg total) by mouth daily. Take with or immediately following a meal. 10/10/18   Mayo, Allyn Kenner, MD  Multiple Vitamin (MULTI-VITAMINS) TABS Take by mouth.    [provider]  oxyCODONE (OXY IR/ROXICODONE) 5 MG immediate release tablet Take 1 tablet (5 mg total) by mouth every 6 (six) hours as needed for severe pain. Must last 30 days 09/11/20 10/11/20  Delano Metz, MD  oxyCODONE (OXY IR/ROXICODONE) 5 MG immediate release tablet Take 1 tablet (5 mg total) by mouth every 6 (six) hours as needed for severe pain. Must last 30 days 10/11/20 11/10/20  Delano Metz, MD  oxyCODONE (OXY IR/ROXICODONE) 5 MG immediate release tablet Take 1 tablet (5 mg total) by mouth every 6 (six) hours as needed for severe pain. Must last 30 days 11/10/20 12/10/20  Delano Metz, MD  predniSONE (STERAPRED UNI-PAK 21 TAB) 5 MG (21) TBPK tablet Take by mouth. 09/24/20   [provider]  rosuvastatin (CRESTOR) 5 MG tablet Take 5 mg by mouth 2 (two) times a week. 12/23/18   [provider]  spironolactone (ALDACTONE) 25 MG tablet TAKE 1/2 (ONE-HALF) TABLET BY MOUTH DAILY 09/12/14   [provider]  SYNTHROID 125 MCG tablet Take by mouth. 09/22/20   [provider]  valACYclovir (VALTREX) 1000 MG tablet PLEASE SEE ATTACHED FOR DETAILED DIRECTIONS 10/12/18   [provider]    Allergies Bupropion and Penicillins  Family History  Problem Relation Age of Onset   Hypertension Mother    Stroke Mother    Heart attack Father    Alcohol abuse Father    Depression Father     Post-traumatic stress disorder Father    Rheum arthritis Sister    Hypertension Sister    Depression Sister    Breast cancer Neg Hx     Social History Social History   Tobacco Use   Smoking status: Never   Smokeless tobacco: Never  Vaping Use   Vaping Use: Never used  Substance Use Topics   Alcohol use: No    Alcohol/week: 0.0 standard drinks   Drug use: No    Review of Systems Constitutional: No fever/chills Eyes: No visual changes. ENT: No sore throat. Cardiovascular: Denies chest pain. Respiratory: Denies shortness of breath. Gastrointestinal: No abdominal pain.  No nausea, no vomiting.  No diarrhea. Genitourinary: Negative for dysuria. Musculoskeletal: Positive for acute arthralgias to left PIP joints of the hand Skin: Negative for rash. Neurological: Endorses intermittent lightheadedness.  Negative  for headaches, weakness/numbness/paresthesias in any extremity Psychiatric: Negative for suicidal ideation/homicidal ideation   ____________________________________________   PHYSICAL EXAM:  VITAL SIGNS: ED Triage Vitals  Enc Vitals Group     BP 11/14/20 1308 111/62     Pulse Rate 11/14/20 1308 67     Resp 11/14/20 1308 16     Temp 11/14/20 1311 98.3 F (36.8 C)     Temp Source 11/14/20 1311 Oral     SpO2 11/14/20 1308 100 %     Weight 11/14/20 1311 230 lb (104.3 kg)     Height 11/14/20 1308 5\' 6"  (1.676 m)     Head Circumference --      Peak Flow --      Pain Score 11/14/20 1307 5     Pain Loc --      Pain Edu? --      Excl. in [place]? --    Constitutional: Alert and oriented. Well appearing and in no acute distress. Eyes: Conjunctivae are normal. PERRL. Head: Atraumatic. Nose: No congestion/rhinnorhea. Mouth/Throat: Mucous membranes are moist. Neck: No stridor Cardiovascular: Grossly normal heart sounds.  Good peripheral circulation. Respiratory: Normal respiratory effort.  No retractions. Gastrointestinal: Soft and nontender. No  distention. Musculoskeletal: Rheumatoid lesions to hand joints bilaterally with unroofed medial nodes of the PIP joints of the left hand with surrounding erythema Neurologic:  Normal speech and language. No gross focal neurologic deficits are appreciated. Skin:  Skin is warm and dry. No rash noted. Psychiatric: Mood and affect are normal. Speech and behavior are normal.  ____________________________________________   LABS (all labs ordered are listed, but only abnormal results are displayed)  Labs Reviewed  COMPREHENSIVE METABOLIC PANEL - Abnormal; Notable for the following components:      Result Value   Glucose, Bld 177 (*)    BUN 24 (*)    Creatinine, Ser 1.30 (*)    GFR, Estimated 43 (*)    All other components within normal limits  CBC WITH DIFFERENTIAL/PLATELET - Abnormal; Notable for the following components:   Hemoglobin 15.2 (*)    All other components within normal limits  URINALYSIS, ROUTINE W REFLEX MICROSCOPIC    PROCEDURES  Procedure(s) performed (including Critical Care):  Procedures   ____________________________________________   INITIAL IMPRESSION / ASSESSMENT AND PLAN / ED COURSE  As part of my medical decision making, I reviewed the following data within the electronic medical record, if available:  Nursing notes reviewed and incorporated, Labs reviewed, EKG interpreted, Old chart reviewed, Radiograph reviewed and Notes from prior ED visits reviewed and incorporated        Presentation most consistent with simple cellulitis. Given History, Exam, and Workup I have low suspicion for Necrotizing Fasciitis, Abscess, Osteomyelitis, DVT or other emergent problem as a cause for this presentation.  Rx: Doxycycline 100 mg twice daily x5 days Patient also shows signs of dehydration and given the increase stress that she has had in her life including a husband who is in rehabilitation at the time, patient admits to poor p.o. intake and poor sleep as well as  possibility of inaccurately taking her multiple different narcotic medications.  Patient encouraged to perform adequate self-care as well as possibly retained the services of a home nurse Disposition: Discharge. No evidence of serious bacterial illness. Nontoxic appearing, VSS. Low risk for treatment failure based on history. Strict return precautions discussed with patient with full understanding. Advised patient to follow up promptly with primary care provider within next 48 hours.  ____________________________________________   FINAL CLINICAL IMPRESSION(S) / ED DIAGNOSES  Final diagnoses:  Dehydration  Rheumatoid arthritis involving both hands with negative rheumatoid factor ([place])  Polypharmacy     ED Discharge Orders          Ordered    doxycycline (VIBRAMYCIN) 50 MG capsule  2 times daily        11/14/20 1823             Note:  This document was prepared using Dragon voice recognition software and may include unintentional dictation errors.    Naaman Plummer, MD 11/14/20 [phone]

## 2020-11-14 NOTE — ED Notes (Signed)
Patient declined discharge vital signs. 

## 2020-11-14 NOTE — ED Provider Notes (Signed)
Emergency Medicine Provider Triage Evaluation Note  Laura [name] , a 75 y.o. female  was evaluated in triage.  Pt complains of here with possible infection to finger on left hand.  Went to [facility] this am and was sent to ER due to low BP and history of 4 falls since 10/27/20.  Denies any head injury or LOC with falls.  Admits to picking at wart that is on her finger.    Review of Systems  Positive: Pain left 3rd finger. Negative: No fever, chills or drainage from finger.  No headache or change in vision.    Physical Exam  There were no vitals taken for this visit. Gen:   Awake, no distress  talkative. Resp:  Normal effort.  MSK:   Moves extremities without difficulty  Other:  Left fingers have wart like lesions present with mild erythema to left 3rd finger.  Medical Decision Making  Medically screening exam initiated at 1:06 PM.  Appropriate orders placed.  Dacy Mcmenamy was informed that the remainder of the evaluation will be completed by another provider, this initial triage assessment does not replace that evaluation, and the importance of remaining in the ED until their evaluation is complete.     Tommi Rumps, PA-C 11/14/20 1317    Gilles Chiquito, MD 11/14/20 [phone]

## 2020-11-14 NOTE — ED Triage Notes (Signed)
Pt to ED from [facility]. Pt originally went to the walk in clinic for a finger injury. Pt reports that when she was getting into the car she noticed that she was dizzy, when they took her blood pressure at the clinic she was orthostatic per daughters reports. Pt has also had increased falls over the last 5 days. Pt reports increased amount of stress at home now that her husband is in rehab.

## 2020-11-24 ENCOUNTER — Encounter: Payer: Self-pay | Admitting: Pain Medicine

## 2020-12-09 ENCOUNTER — Other Ambulatory Visit: Payer: Self-pay

## 2020-12-09 ENCOUNTER — Encounter: Payer: Self-pay | Admitting: Pain Medicine

## 2020-12-09 ENCOUNTER — Ambulatory Visit: Payer: Medicare Other | Attending: Pain Medicine | Admitting: Pain Medicine

## 2020-12-09 VITALS — BP 144/97 | HR 101 | Temp 97.2°F | Resp 16 | Ht 65.0 in | Wt 240.0 lb

## 2020-12-09 DIAGNOSIS — M545 Low back pain, unspecified: Secondary | ICD-10-CM | POA: Insufficient documentation

## 2020-12-09 DIAGNOSIS — G8929 Other chronic pain: Secondary | ICD-10-CM | POA: Diagnosis present

## 2020-12-09 DIAGNOSIS — Z79891 Long term (current) use of opiate analgesic: Secondary | ICD-10-CM | POA: Diagnosis present

## 2020-12-09 DIAGNOSIS — M79604 Pain in right leg: Secondary | ICD-10-CM | POA: Diagnosis present

## 2020-12-09 DIAGNOSIS — M5441 Lumbago with sciatica, right side: Secondary | ICD-10-CM | POA: Diagnosis present

## 2020-12-09 DIAGNOSIS — M4316 Spondylolisthesis, lumbar region: Secondary | ICD-10-CM | POA: Insufficient documentation

## 2020-12-09 DIAGNOSIS — Z79899 Other long term (current) drug therapy: Secondary | ICD-10-CM | POA: Insufficient documentation

## 2020-12-09 DIAGNOSIS — M797 Fibromyalgia: Secondary | ICD-10-CM | POA: Diagnosis present

## 2020-12-09 DIAGNOSIS — G894 Chronic pain syndrome: Secondary | ICD-10-CM | POA: Insufficient documentation

## 2020-12-09 DIAGNOSIS — Z7901 Long term (current) use of anticoagulants: Secondary | ICD-10-CM | POA: Diagnosis present

## 2020-12-09 DIAGNOSIS — M25561 Pain in right knee: Secondary | ICD-10-CM | POA: Insufficient documentation

## 2020-12-09 DIAGNOSIS — M5137 Other intervertebral disc degeneration, lumbosacral region: Secondary | ICD-10-CM | POA: Insufficient documentation

## 2020-12-09 DIAGNOSIS — Z6841 Body Mass Index (BMI) 40.0 and over, adult: Secondary | ICD-10-CM | POA: Insufficient documentation

## 2020-12-09 DIAGNOSIS — M48061 Spinal stenosis, lumbar region without neurogenic claudication: Secondary | ICD-10-CM | POA: Insufficient documentation

## 2020-12-09 DIAGNOSIS — M5442 Lumbago with sciatica, left side: Secondary | ICD-10-CM | POA: Insufficient documentation

## 2020-12-09 DIAGNOSIS — M25552 Pain in left hip: Secondary | ICD-10-CM | POA: Diagnosis present

## 2020-12-09 DIAGNOSIS — M159 Polyosteoarthritis, unspecified: Secondary | ICD-10-CM | POA: Diagnosis present

## 2020-12-09 DIAGNOSIS — I639 Cerebral infarction, unspecified: Secondary | ICD-10-CM | POA: Diagnosis not present

## 2020-12-09 DIAGNOSIS — M48062 Spinal stenosis, lumbar region with neurogenic claudication: Secondary | ICD-10-CM | POA: Diagnosis present

## 2020-12-09 DIAGNOSIS — M79605 Pain in left leg: Secondary | ICD-10-CM | POA: Insufficient documentation

## 2020-12-09 MED ORDER — OXYCODONE HCL 5 MG PO TABS: 5.0000 mg | ORAL_TABLET | Freq: Four times a day (QID) | ORAL | 0 refills | Status: DC | PRN

## 2020-12-09 MED ORDER — ORPHENADRINE CITRATE 30 MG/ML IJ SOLN
30.0000 mg | Freq: Once | INTRAMUSCULAR | Status: AC
  Administered 2020-12-09: 30 mg via INTRAMUSCULAR

## 2020-12-09 MED ORDER — ORPHENADRINE CITRATE 30 MG/ML IJ SOLN
INTRAMUSCULAR | Status: AC
  Filled 2020-12-09: qty 2

## 2020-12-09 MED ORDER — OXYCODONE HCL 5 MG PO TABS
5.0000 mg | ORAL_TABLET | Freq: Four times a day (QID) | ORAL | 0 refills | Status: DC | PRN
Start: 2020-12-10 — End: 2021-03-09

## 2020-12-09 NOTE — Progress Notes (Signed)
PROVIDER NOTE: Information contained herein reflects review and annotations entered in association with encounter. Interpretation of such information and data should be left to medically-trained personnel. Information provided to patient can be located elsewhere in the medical record under "Patient Instructions". Document created using STT-dictation technology, any transcriptional errors that may result from process are unintentional.    Patient: Laura [name]  Service Category: E/M  Provider: Gaspar Cola, MD  DOB: 1945/08/28  DOS: 12/09/2020  Specialty: Interventional Pain Management  MRN: [id number]  Setting: Ambulatory outpatient  PCP: Adin Hector, MD  Type: Established Patient    Referring Provider: Adin Hector, MD  Location: Office  Delivery: Face-to-face     HPI  Laura [name], a 75 y.o. year old female, is here today because of her Acute exacerbation of chronic low back pain [M54.50, G89.29]. Ms. Kimmet's primary complain today is Back Pain Last encounter: My last encounter with her was on 08/18/2020. Pertinent problems: Ms. 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); Lumbar foraminal stenosis (Bilateral L3-4 and L5-S1); Chronic hip pain (Left); Osteoarthritis of knee (Right); Arthritis; Fibromyalgia syndrome; Chronic arthralgias of knees and hips (Right); Chronic hand pain, left; Dropfoot (Left); Weakness of foot (Left); Sciatica (Left); Weakness of leg (Left); Chronic lower extremity pain (2ry area of Pain) (Bilateral);  Lumbosacral radiculopathy at L5 (Left); Abnormal MRI, lumbar spine (12/26/2019); DDD (degenerative disc disease), lumbosacral; Statin myopathy; Foot drop, left; 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 Pain Assessment: Severity of Chronic pain is reported as a 5 /10. Location: Back Lower/Radiates from lower back into buttocks bilateral. Onset: More than a month ago. Quality: Aching, Constant, Shooting. Timing: Constant. Modifying factor(s): Sitting, heating pad at night, oxycodone Q6 hours. Vitals:  height is _0  (1.651 m) and weight is 240 lb (108.9 kg). Her temporal temperature is 97.2 F (36.2 C) (abnormal). Her blood pressure is 144/97 (abnormal) and her pulse is 101 (abnormal). Her respiration is 16 and oxygen saturation is 100%.   Reason for encounter: medication management.   The patient indicates doing well with the current medication regimen. No adverse reactions or side effects reported to the medications.  In addition to this, the patient indicates having a flareup of her low back pain and will like to have a treatment for her lumbar facet syndrome.  She has also requested an IM injection today.  I have reviewed her medical records and her last comprehensive metabolic panel shows her glomerular filtration rate to be low and therefore we will avoid giving her a Toradol injection today.  I will provide her with an IM injection of Norflex 30 mg.  She indicates that her blood sugar is under control and therefore we will go ahead and proceed with scheduling the bilateral lumbar facet block.  RTCB: 03/10/2021  Pharmacotherapy Assessment  Analgesic: Oxycodone  IR 5 mg every 6  hours (20 mg/day) MME/day: 30 mg/day.   Monitoring: [place] PMP: PDMP reviewed during this encounter.       Pharmacotherapy: No side-effects or adverse reactions reported. Compliance: No problems identified. Effectiveness: Clinically acceptable.  Al Decant, RN  12/09/2020  3:19 PM  Sign when Signing Visit Safety precautions to be maintained throughout the outpatient stay will include: orient to surroundings, keep bed in low position, maintain call bell within reach at all times, provide assistance with transfer out of bed and ambulation.   Nursing Pain Medication Assessment:  Safety precautions to be maintained throughout the outpatient stay will include: orient to surroundings, keep bed in low position, maintain call bell within reach at all times, provide assistance with transfer out of bed and ambulation.  Medication Inspection Compliance: Pill count conducted under aseptic conditions, in front of the patient. Neither the pills nor the bottle was removed from the patient's sight at any time. Once count was completed pills were immediately returned to the patient in their original bottle.  Medication:  Oxyocdone HCL 89m Pill/Patch Count:  7 of 120 pills remain Pill/Patch Appearance: Markings consistent with prescribed medication Bottle Appearance: Standard pharmacy container. Clearly labeled. Filled Date: 151/ 02 / 2022 Last Medication intake:  Today     UDS:  Summary  Date Value Ref Range Status  07/09/2020 Note  Final    Comment:    ==================================================================== [employer] Select 13 (MW) ==================================================================== Test                             Result       Flag       Units  Drug Absent but Declared for Prescription Verification   Oxycodone                      Not Detected UNEXPECTED ng/mg creat ==================================================================== Test                       Result    Flag   Units      Ref Range   Creatinine              67               mg/dL      >=20 ==================================================================== Declared Medications:  The flagging and interpretation on this report are based on the  following declared medications.  Unexpected results may arise from  inaccuracies in the declared medications.   **Note: The testing scope of this panel includes these medications:   Oxycodone   **Note: The testing scope of this panel does not include the  following reported medications:   Albuterol  Aspirin  Azelastine  Chlorthalidone  Cholecalciferol  Duloxetine (Cymbalta)  Empagliflozin (Jardiance)  Hydroxychloroquine (Plaquenil)  Hydroxyzine  Infliximab (Remicade)  Levothyroxine  Loratadine (Claritin)  Methotrexate  Metoprolol  Multivitamin  Rosuvastatin  Spironolactone  Valacyclovir (Valtrex)  Zaleplon (Sonata) ==================================================================== For clinical consultation, please call [phone] ====================================================================      ROS  Constitutional: Denies any fever or chills Gastrointestinal: No reported hemesis, hematochezia, vomiting, or acute GI distress Musculoskeletal: Denies any acute onset joint swelling, redness, loss of ROM, or weakness Neurological: No reported episodes of acute onset apraxia, aphasia, dysarthria, agnosia, amnesia, paralysis, loss of coordination, or loss of consciousness  Medication Review  DULoxetine, Fluticasone Propionate (Inhal), Multi-Vitamins, Vitamin D, albuterol, aspirin EC, azelastine, chlorthalidone, doxycycline, empagliflozin, folic acid, hydrOXYzine, hydroxychloroquine, inFLIXimab,  levothyroxine, loratadine, methotrexate, metoprolol succinate, oxyCODONE, predniSONE, rosuvastatin, spironolactone, and valACYclovir  History Review  Allergy: Ms. Brodbeck is allergic to bupropion and  penicillins. Drug: Ms. Mathia  reports no history of drug use. Alcohol:  reports no history of alcohol use. Tobacco:  reports that she has never smoked. She has never used smokeless tobacco. Social: Ms. Bolding  reports that she has never smoked. She has never used smokeless tobacco. She reports that she does not drink alcohol and does not use drugs. Medical:  has a past medical history of Allergy, Anxiety, Arthritis, degenerative (10/08/2013), Asthma, Chronic hand pain, left (10/10/2019), Chronic kidney disease, Depression, Lumbar spinal stenosis with neurogenic claudication (11/07/2014), Major depression, single episode, in complete remission ([place]) (06/25/2015), Memory loss, short term (03/19/2014), Sciatica of left side (12/31/2019), Seizure ([place]) (10/07/2014), Sleep apnea, Sleep apnea, and Thyroid disease. Surgical: Ms. Fellows  has a past surgical history that includes Abdominal hysterectomy; Cesarean section; Replacement total knee (Left); and Hip surgery (Right). Family: family history includes Alcohol abuse in her father; Depression in her father and sister; Heart attack in her father; Hypertension in her mother and sister; Post-traumatic stress disorder in her father; Rheum arthritis in her sister; Stroke in her mother.  Laboratory Chemistry Profile   Renal Lab Results  Component Value Date   BUN 24 (H) 11/14/2020   CREATININE 1.30 (H) 11/14/2020   GFRAA 57 (L) 10/03/2019   GFRNONAA 43 (L) 11/14/2020    Hepatic Lab Results  Component Value Date   AST 20 11/14/2020   ALT 15 11/14/2020   ALBUMIN 4.1 11/14/2020   ALKPHOS 43 11/14/2020    Electrolytes Lab Results  Component Value Date   NA 136 11/14/2020   K 3.6 11/14/2020   CL 100 11/14/2020   CALCIUM 9.1 11/14/2020   MG 2.3 10/08/2018    Bone Lab Results  Component Value Date   25OHVITD1 36 07/15/2015   25OHVITD2 3.6 07/15/2015   25OHVITD3 32 07/15/2015    Inflammation (CRP: Acute Phase) (ESR: Chronic Phase) Lab Results   Component Value Date   CRP 1.9 (H) 07/15/2015   ESRSEDRATE 32 (H) 07/15/2015   LATICACIDVEN 1.4 10/07/2018         Note: Above Lab results reviewed.  Recent Imaging Review  MM 3D SCREEN BREAST BILATERAL CLINICAL DATA:  Screening.  EXAM: DIGITAL SCREENING BILATERAL MAMMOGRAM WITH TOMOSYNTHESIS AND CAD  TECHNIQUE: Bilateral screening digital craniocaudal and mediolateral oblique mammograms were obtained. Bilateral screening digital breast tomosynthesis was performed. The images were evaluated with computer-aided detection.  COMPARISON:  Previous exam(s).  ACR Breast Density Category b: There are scattered areas of fibroglandular density.  FINDINGS: There are no findings suspicious for malignancy.  IMPRESSION: No mammographic evidence of malignancy. A result letter of this screening mammogram will be mailed directly to the patient.  RECOMMENDATION: Screening mammogram in one year. (Code:SM-B-01Y)  BI-RADS CATEGORY  1: Negative.  Electronically Signed   By: Kristopher Oppenheim M.D.   On: 09/18/2020 16:19 Note: Reviewed        Physical Exam  General appearance: Well nourished, well developed, and well hydrated. In no apparent acute distress Mental status: Alert, oriented x 3 (person, place, & time)       Respiratory: No evidence of acute respiratory distress Eyes: PERLA Vitals: BP (!) 144/97 (BP Location: Right Arm, Patient Position: Sitting, Cuff Size: Large)   Pulse (!) 101   Temp (!) 97.2 F (36.2 C) (Temporal)   Resp 16   Ht _0  (1.651 m)  Wt 240 lb (108.9 kg)   SpO2 100%   BMI 39.94 kg/m  BMI: Estimated body mass index is 39.94 kg/m as calculated from the following:   Height as of this encounter: _0  (1.651 m).   Weight as of this encounter: 240 lb (108.9 kg). Ideal: Ideal body weight: 57 kg (125 lb 10.6 oz) Adjusted ideal body weight: 77.7 kg (171 lb 6.4 oz)  Assessment   Status Diagnosis  Controlled Controlled Controlled 1. Acute exacerbation  of chronic low back pain   2. Chronic low back pain (1ry area of Pain) (Bilateral) (L>R) w/ sciatica (Bilateral)   3. Lumbar spinal stenosis (5 mm Severe L3-4; 8 mm L4-5) w/ neurogenic claudication   4. Lumbar lateral recess stenosis (Bilateral) (L2-3, L3-4, L4-5) (Severe: L3-4)   5. Lumbar Grade 1 Anterolisthesis of L3/L4 and L4/L5   6. DDD (degenerative disc disease), lumbosacral   7. Chronic lower extremity pain (2ry area of Pain) (Bilateral)   8. Chronic hip pain (Left)   9. Chronic knee pain (Right)   10. Generalized osteoarthritis of multiple sites   11. Fibromyalgia syndrome   12. Chronic pain syndrome   13. Pharmacologic therapy   14. Chronic anticoagulation (Plaquenil)   15. Chronic use of opiate for therapeutic purpose   16. Encounter for chronic pain management   17. Morbid obesity with BMI of 40.0-44.9, adult ([place])   18. Encounter for medication management      Updated Problems: Problem  Generalized Osteoarthritis of Multiple Sites  Chronic low back pain (1ry area of Pain) (Bilateral) (L>R) w/o sciatica  Chronic lower extremity pain (2ry area of Pain) (Bilateral)  Fibromyalgia Syndrome    Plan of Care  Problem-specific:  No problem-specific Assessment & Plan notes found for this encounter.  Ms. Halayna Blane has a current medication list which includes the following long-term medication(s): albuterol, azelastine, chlorthalidone, duloxetine, flovent diskus, infliximab, levothyroxine, levothyroxine, loratadine, metoprolol succinate, spironolactone, [START ON 12/10/2020] oxycodone, [START ON 01/09/2021] oxycodone, [START ON 02/08/2021] oxycodone, and synthroid.  Pharmacotherapy (Medications Ordered): Meds ordered this encounter  Medications   oxyCODONE (OXY IR/ROXICODONE) 5 MG immediate release tablet    Sig: Take 1 tablet (5 mg total) by mouth every 6 (six) hours as needed for severe pain. Must last 30 days    Dispense:  120 tablet    Refill:  0    DO NOT: delete  (not duplicate); no partial-fill (will deny script to complete), no refill request (F/U required). DISPENSE: 1 day early if closed on fill date. WARN: No CNS-depressants within 8 hrs of med.   oxyCODONE (OXY IR/ROXICODONE) 5 MG immediate release tablet    Sig: Take 1 tablet (5 mg total) by mouth every 6 (six) hours as needed for severe pain. Must last 30 days    Dispense:  120 tablet    Refill:  0    DO NOT: delete (not duplicate); no partial-fill (will deny script to complete), no refill request (F/U required). DISPENSE: 1 day early if closed on fill date. WARN: No CNS-depressants within 8 hrs of med.   oxyCODONE (OXY IR/ROXICODONE) 5 MG immediate release tablet    Sig: Take 1 tablet (5 mg total) by mouth every 6 (six) hours as needed for severe pain. Must last 30 days    Dispense:  120 tablet    Refill:  0    DO NOT: delete (not duplicate); no partial-fill (will deny script to complete), no refill request (F/U required). DISPENSE: 1 day early if  closed on fill date. WARN: No CNS-depressants within 8 hrs of med.   orphenadrine (NORFLEX) injection 30 mg    Orders:  Orders Placed This Encounter  Procedures   Lumbar Epidural Injection    Standing Status:   Future    Standing Expiration Date:   03/09/2021    Scheduling Instructions:     Procedure: Interlaminar Lumbar Epidural Steroid injection (LESI)  L2-3     Laterality: Midline     Sedation: None required.     Timeframe: ASAA    Order Specific Question:   Where will this procedure be performed?    Answer:   [facility]   Blood Thinner Instructions to Nursing    Always make sure patient has clearance from prescribing physician to stop blood thinners for interventional therapies. If the patient requires a Lovenox-bridge therapy, make sure arrangements are made to institute it with the assistance of the PCP.    Scheduling Instructions:     Have Ms. Vanorman stop the Plaquenil (Hydroxychloroquine) x 11 days prior to procedure or  surgery.    Follow-up plan:   Return for [facility]) procedure: (ML) L2-3 LESI #3, (NS), (Blood Thinner Protocol).     Interventional Therapies  Risk  Complexity Considerations:   Estimated body mass index is 41.16 kg/m as calculated from the following:   Height as of this encounter: _0  (1.676 m).   Weight as of this encounter: 255 lb (115.7 kg). PLAQUENIL Anticoagulation (Stop: 11 days)   Planned  Pending:   Therapeutic midline L2-3 LESI #3    Under consideration:   Diagnostic bilateral genicular NB  Diagnostic bilateral L3 and/or L5 TFESI    Completed:   Therapeutic right IA steroid knee injection x2 (10/15/2015)  Therapeutic right Hyalgan knee injection x1 (09/01/2016)  Therapeutic left lumbar facet block x1 (01/22/2015)  Therapeutic left IA hip injection x1 (03/17/2015)  Therapeutic left L4-5 LESI x1 (02/04/2020)  Therapeutic midline L2-3 LESI x2 (05/28/2020)    Therapeutic  Palliative (PRN) options:   Therapeutic right IA Hyalgan knee injections Palliative left IA hip joint injection  Diagnostic left lumbar facet block #2      Recent Visits No visits were found meeting these conditions. Showing recent visits within past 90 days and meeting all other requirements Today's Visits Date Type Provider Dept  12/09/20 Office Visit Milinda Pointer, MD [facility]  Showing today's visits and meeting all other requirements Future Appointments No visits were found meeting these conditions. Showing future appointments within next 90 days and meeting all other requirements I discussed the assessment and treatment plan with the patient. The patient was provided an opportunity to ask questions and all were answered. The patient agreed with the plan and demonstrated an understanding of the instructions.  Patient advised to call back or seek an in-person evaluation if the symptoms or condition worsens.  Duration of encounter: 30 minutes.  Note by: Gaspar Cola,  MD Date: 12/09/2020; Time: 3:22 PM

## 2020-12-09 NOTE — Patient Instructions (Addendum)
____________________________________________________________________________________________  Blood Thinners  Stop Plaquenil 11 days prior to procedure.   IMPORTANT NOTICE:  If you take any of these, make sure to notify the nursing staff.  Failure to do so may result in injury.  Recommended time intervals to stop and restart blood-thinners, before & after invasive procedures  Generic Name Brand Name Pre-procedure. Stop this long before procedure. Post-procedure. Minimum waiting period before restarting.  Abciximab Reopro 15 days 2 hrs  Alteplase Activase 10 days 10 days  Anagrelide Agrylin    Apixaban Eliquis 3 days 6 hrs  Cilostazol Pletal 3 days 5 hrs  Clopidogrel Plavix 7-10 days 2 hrs  Dabigatran Pradaxa 5 days 6 hrs  Dalteparin Fragmin 24 hours 4 hrs  Dipyridamole Aggrenox 11days 2 hrs  Edoxaban Lixiana; Savaysa 3 days 2 hrs  Enoxaparin  Lovenox 24 hours 4 hrs  Eptifibatide Integrillin 8 hours 2 hrs  Fondaparinux  Arixtra 72 hours 12 hrs  Hydroxychloroquine Plaquenil 11 days   Prasugrel Effient 7-10 days 6 hrs  Reteplase Retavase 10 days 10 days  Rivaroxaban Xarelto 3 days 6 hrs  Ticagrelor Brilinta 5-7 days 6 hrs  Ticlopidine Ticlid 10-14 days 2 hrs  Tinzaparin Innohep 24 hours 4 hrs  Tirofiban Aggrastat 8 hours 2 hrs  Warfarin Coumadin 5 days 2 hrs   Other medications with blood-thinning effects  Product indications Generic (Brand) names Note  Cholesterol Lipitor Stop 4 days before procedure  Blood thinner (injectable) Heparin (LMW or LMWH Heparin) Stop 24 hours before procedure  Cancer Ibrutinib (Imbruvica) Stop 7 days before procedure  Malaria/Rheumatoid Hydroxychloroquine (Plaquenil) Stop 11 days before procedure  Thrombolytics  10 days before or after procedures   Over-the-counter (OTC) Products with blood-thinning effects  Product Common names Stop Time  Aspirin > 325 mg Goody Powders, Excedrin, etc. 11 days  Aspirin ? 81 mg  7 days  Fish oil  4 days   Garlic supplements  7 days  Ginkgo biloba  36 hours  Ginseng  24 hours  NSAIDs Ibuprofen, Naprosyn, etc. 3 days  Vitamin E  4 days   ____________________________________________________________________________________________   ______________________________________________________________________  Preparing for your procedure (without sedation)  Procedure appointments are limited to planned procedures: No Prescription Refills. No disability issues will be discussed. No medication changes will be discussed.  Instructions: Oral Intake: Do not eat or drink anything for at least 6 hours prior to your procedure. (Exception: Blood Pressure Medication. See below.) Transportation: Unless otherwise stated by your physician, you may drive yourself after the procedure. Blood Pressure Medicine: Do not forget to take your blood pressure medicine with a sip of water the morning of the procedure. If your Diastolic (lower reading)is above 100 mmHg, elective cases will be cancelled/rescheduled. Blood thinners: These will need to be stopped for procedures. Notify our staff if you are taking any blood thinners. Depending on which one you take, there will be specific instructions on how and when to stop it. Diabetics on insulin: Notify the staff so that you can be scheduled 1st case in the morning. If your diabetes requires high dose insulin, take only  of your normal insulin dose the morning of the procedure and notify the staff that you have done so. Preventing infections: Shower with an antibacterial soap the morning of your procedure.  Build-up your immune system: Take 1000 mg of Vitamin C with every meal (3 times a day) the day prior to your procedure. Antibiotics: Inform the staff if you have a condition or reason that requires you  to take antibiotics before dental procedures. Pregnancy: If you are pregnant, call and cancel the procedure. Sickness: If you have a cold, fever, or any active  infections, call and cancel the procedure. Arrival: You must be in the facility at least 30 minutes prior to your scheduled procedure. Children: Do not bring any children with you. Dress appropriately: Bring dark clothing that you would not mind if they get stained. Valuables: Do not bring any jewelry or valuables.  Reasons to call and reschedule or cancel your procedure: (Following these recommendations will minimize the risk of a serious complication.) Surgeries: Avoid having procedures within 2 weeks of any surgery. (Avoid for 2 weeks before or after any surgery). Flu Shots: Avoid having procedures within 2 weeks of a flu shots or . (Avoid for 2 weeks before or after immunizations). Barium: Avoid having a procedure within 7-10 days after having had a radiological study involving the use of radiological contrast. (Myelograms, Barium swallow or enema study). Heart attacks: Avoid any elective procedures or surgeries for the initial 6 months after a "Myocardial Infarction" (Heart Attack). Blood thinners: It is imperative that you stop these medications before procedures. Let us know if you if you take any blood thinner.  Infection: Avoid procedures during or within two weeks of an infection (including chest colds or gastrointestinal problems). Symptoms associated with infections include: Localized redness, fever, chills, night sweats or profuse sweating, burning sensation when voiding, cough, congestion, stuffiness, runny nose, sore throat, diarrhea, nausea, vomiting, cold or Flu symptoms, recent or current infections. It is specially important if the infection is over the area that we intend to treat. Heart and lung problems: Symptoms that may suggest an active cardiopulmonary problem include: cough, chest pain, breathing difficulties or shortness of breath, dizziness, ankle swelling, uncontrolled high or unusually low blood pressure, and/or palpitations. If you are experiencing any of these symptoms,  cancel your procedure and contact your primary care physician for an evaluation.  Remember:  Regular Business hours are:  Monday to Thursday 8:00 AM to 4:00 PM  Provider's Schedule: Delano Metz, MD:  Procedure days: Tuesday and Thursday 7:30 AM to 4:00 PM  Edward Jolly, MD:  Procedure days: Monday and Wednesday 7:30 AM to 4:00 PM ______________________________________________________________________  ____________________________________________________________________________________________  General Risks and Possible Complications  Patient Responsibilities: It is important that you read this as it is part of your informed consent. It is our duty to inform you of the risks and possible complications associated with treatments offered to you. It is your responsibility as a patient to read this and to ask questions about anything that is not clear or that you believe was not covered in this document.  Patient's Rights: You have the right to refuse treatment. You also have the right to change your mind, even after initially having agreed to have the treatment done. However, under this last option, if you wait until the last second to change your mind, you may be charged for the materials used up to that point.  Introduction: Medicine is not an Visual merchandiser. Everything in Medicine, including the lack of treatment(s), carries the potential for danger, harm, or loss (which is by definition: Risk). In Medicine, a complication is a secondary problem, condition, or disease that can aggravate an already existing one. All treatments carry the risk of possible complications. The fact that a side effects or complications occurs, does not imply that the treatment was conducted incorrectly. It must be clearly understood that these can happen even when everything  is done following the highest safety standards.  No treatment: You can choose not to proceed with the proposed treatment alternative. The  "PRO(s)" would include: avoiding the risk of complications associated with the therapy. The "CON(s)" would include: not getting any of the treatment benefits. These benefits fall under one of three categories: diagnostic; therapeutic; and/or palliative. Diagnostic benefits include: getting information which can ultimately lead to improvement of the disease or symptom(s). Therapeutic benefits are those associated with the successful treatment of the disease. Finally, palliative benefits are those related to the decrease of the primary symptoms, without necessarily curing the condition (example: decreasing the pain from a flare-up of a chronic condition, such as incurable terminal cancer).  General Risks and Complications: These are associated to most interventional treatments. They can occur alone, or in combination. They fall under one of the following six (6) categories: no benefit or worsening of symptoms; bleeding; infection; nerve damage; allergic reactions; and/or death. No benefits or worsening of symptoms: In Medicine there are no guarantees, only probabilities. No healthcare provider can ever guarantee that a medical treatment will work, they can only state the probability that it may. Furthermore, there is always the possibility that the condition may worsen, either directly, or indirectly, as a consequence of the treatment. Bleeding: This is more common if the patient is taking a blood thinner, either prescription or over the counter (example: Goody Powders, Fish oil, Aspirin, Garlic, etc.), or if suffering a condition associated with impaired coagulation (example: Hemophilia, cirrhosis of the liver, low platelet counts, etc.). However, even if you do not have one on these, it can still happen. If you have any of these conditions, or take one of these drugs, make sure to notify your treating physician. Infection: This is more common in patients with a compromised immune system, either due to disease  (example: diabetes, cancer, human immunodeficiency virus [HIV], etc.), or due to medications or treatments (example: therapies used to treat cancer and rheumatological diseases). However, even if you do not have one on these, it can still happen. If you have any of these conditions, or take one of these drugs, make sure to notify your treating physician. Nerve Damage: This is more common when the treatment is an invasive one, but it can also happen with the use of medications, such as those used in the treatment of cancer. The damage can occur to small secondary nerves, or to large primary ones, such as those in the spinal cord and brain. This damage may be temporary or permanent and it may lead to impairments that can range from temporary numbness to permanent paralysis and/or brain death. Allergic Reactions: Any time a substance or material comes in contact with our body, there is the possibility of an allergic reaction. These can range from a mild skin rash (contact dermatitis) to a severe systemic reaction (anaphylactic reaction), which can result in death. Death: In general, any medical intervention can result in death, most of the time due to an unforeseen complication. ____________________________________________________________________________________________

## 2020-12-09 NOTE — Progress Notes (Signed)
Safety precautions to be maintained throughout the outpatient stay will include: orient to surroundings, keep bed in low position, maintain call bell within reach at all times, provide assistance with transfer out of bed and ambulation.   Nursing Pain Medication Assessment:  Safety precautions to be maintained throughout the outpatient stay will include: orient to surroundings, keep bed in low position, maintain call bell within reach at all times, provide assistance with transfer out of bed and ambulation.  Medication Inspection Compliance: Pill count conducted under aseptic conditions, in front of the patient. Neither the pills nor the bottle was removed from the patient's sight at any time. Once count was completed pills were immediately returned to the patient in their original bottle.  Medication:  Oxyocdone HCL 5mg  Pill/Patch Count:  7 of 120 pills remain Pill/Patch Appearance: Markings consistent with prescribed medication Bottle Appearance: Standard pharmacy container. Clearly labeled. Filled Date: 62 / 02 / 2022 Last Medication intake:  Today

## 2020-12-22 ENCOUNTER — Ambulatory Visit (HOSPITAL_BASED_OUTPATIENT_CLINIC_OR_DEPARTMENT_OTHER): Payer: Medicare Other | Admitting: Pain Medicine

## 2020-12-22 ENCOUNTER — Other Ambulatory Visit: Payer: Self-pay

## 2020-12-22 ENCOUNTER — Ambulatory Visit
Admission: RE | Admit: 2020-12-22 | Discharge: 2020-12-22 | Disposition: A | Discharge status: Home / Self Care | Payer: Medicare Other | Source: Ambulatory Visit | Attending: Pain Medicine | Admitting: Pain Medicine

## 2020-12-22 ENCOUNTER — Ambulatory Visit: Payer: Medicare Other | Admitting: Internal Medicine

## 2020-12-22 ENCOUNTER — Encounter: Payer: Self-pay | Admitting: Pain Medicine

## 2020-12-22 VITALS — BP 127/83 | HR 91 | Temp 97.0°F | Resp 18 | Ht 65.0 in | Wt 230.0 lb

## 2020-12-22 DIAGNOSIS — M5410 Radiculopathy, site unspecified: Secondary | ICD-10-CM

## 2020-12-22 DIAGNOSIS — M5137 Other intervertebral disc degeneration, lumbosacral region: Secondary | ICD-10-CM | POA: Insufficient documentation

## 2020-12-22 DIAGNOSIS — M5441 Lumbago with sciatica, right side: Secondary | ICD-10-CM | POA: Insufficient documentation

## 2020-12-22 DIAGNOSIS — M48062 Spinal stenosis, lumbar region with neurogenic claudication: Secondary | ICD-10-CM | POA: Insufficient documentation

## 2020-12-22 DIAGNOSIS — M48061 Spinal stenosis, lumbar region without neurogenic claudication: Secondary | ICD-10-CM | POA: Insufficient documentation

## 2020-12-22 DIAGNOSIS — G8929 Other chronic pain: Secondary | ICD-10-CM | POA: Insufficient documentation

## 2020-12-22 DIAGNOSIS — M2428 Disorder of ligament, vertebrae: Secondary | ICD-10-CM | POA: Diagnosis present

## 2020-12-22 DIAGNOSIS — Z7901 Long term (current) use of anticoagulants: Secondary | ICD-10-CM

## 2020-12-22 DIAGNOSIS — M5442 Lumbago with sciatica, left side: Secondary | ICD-10-CM | POA: Insufficient documentation

## 2020-12-22 DIAGNOSIS — M79604 Pain in right leg: Secondary | ICD-10-CM | POA: Diagnosis present

## 2020-12-22 DIAGNOSIS — M5417 Radiculopathy, lumbosacral region: Secondary | ICD-10-CM

## 2020-12-22 DIAGNOSIS — M79605 Pain in left leg: Secondary | ICD-10-CM | POA: Diagnosis present

## 2020-12-22 DIAGNOSIS — Z6841 Body Mass Index (BMI) 40.0 and over, adult: Secondary | ICD-10-CM

## 2020-12-22 DIAGNOSIS — M4316 Spondylolisthesis, lumbar region: Secondary | ICD-10-CM

## 2020-12-22 MED ORDER — ROPIVACAINE HCL 2 MG/ML IJ SOLN
INTRAMUSCULAR | Status: AC
  Filled 2020-12-22: qty 20

## 2020-12-22 MED ORDER — IOHEXOL 180 MG/ML  SOLN
10.0000 mL | Freq: Once | INTRAMUSCULAR | Status: AC
  Administered 2020-12-22: 5 mL via EPIDURAL
  Filled 2020-12-22: qty 20

## 2020-12-22 MED ORDER — TRIAMCINOLONE ACETONIDE 40 MG/ML IJ SUSP
INTRAMUSCULAR | Status: AC
  Filled 2020-12-22: qty 1

## 2020-12-22 MED ORDER — LACTATED RINGERS IV SOLN: 1000.0000 mL | Freq: Once | INTRAVENOUS | Status: DC

## 2020-12-22 MED ORDER — SODIUM CHLORIDE 0.9% FLUSH
2.0000 mL | Freq: Once | INTRAVENOUS | Status: AC
  Administered 2020-12-22: 2 mL

## 2020-12-22 MED ORDER — TRIAMCINOLONE ACETONIDE 40 MG/ML IJ SUSP
40.0000 mg | Freq: Once | INTRAMUSCULAR | Status: AC
  Administered 2020-12-22: 40 mg

## 2020-12-22 MED ORDER — MIDAZOLAM HCL 5 MG/5ML IJ SOLN: 0.5000 mg | Freq: Once | INTRAMUSCULAR | Status: DC

## 2020-12-22 MED ORDER — LIDOCAINE HCL 2 % IJ SOLN
20.0000 mL | Freq: Once | INTRAMUSCULAR | Status: AC
  Administered 2020-12-22: 100 mg
  Filled 2020-12-22: qty 20

## 2020-12-22 MED ORDER — SODIUM CHLORIDE (PF) 0.9 % IJ SOLN
INTRAMUSCULAR | Status: AC
  Filled 2020-12-22: qty 10

## 2020-12-22 MED ORDER — ROPIVACAINE HCL 2 MG/ML IJ SOLN
2.0000 mL | Freq: Once | INTRAMUSCULAR | Status: AC
  Administered 2020-12-22: 2 mL via EPIDURAL

## 2020-12-22 NOTE — Progress Notes (Signed)
Safety precautions to be maintained throughout the outpatient stay will include: orient to surroundings, keep bed in low position, maintain call bell within reach at all times, provide assistance with transfer out of bed and ambulation.  

## 2020-12-22 NOTE — Patient Instructions (Signed)

## 2020-12-22 NOTE — Progress Notes (Signed)
PROVIDER NOTE: Information contained herein reflects review and annotations entered in association with encounter. Interpretation of such information and data should be left to medically-trained personnel. Information provided to patient can be located elsewhere in the medical record under "Patient Instructions". Document created using STT-dictation technology, any transcriptional errors that may result from process are unintentional.    Patient: Laura [name]  Service Category: Procedure Provider: Oswaldo Done, MD DOB: Mar 11, 1945 DOS: 12/22/2020 Location: [facility] MRN: [id number] Setting: Ambulatory - outpatient Referring Provider: Lynnea Ferrier, MD Type: Established Patient Specialty: Interventional Pain Management PCP: Lynnea Ferrier, MD  Primary Reason for Visit: Interventional Pain Management Treatment. CC: Back Pain (low)   Procedure:          Anesthesia, Analgesia, Anxiolysis:  Type: Therapeutic Inter-Laminar Epidural Steroid Injection  #3  Region: Lumbar Level: L2-3 Level. Laterality: Midline         Anesthesia: Local (1-2% Lidocaine)  Anxiolysis: None  Sedation: None  Guidance: Fluoroscopy           Position: Prone with head of the table was raised to facilitate breathing.   Indications: 1. DDD (degenerative disc disease), lumbosacral   2. Chronic low back pain (1ry area of Pain) (Bilateral) (L>R) w/ sciatica (Bilateral)   3. Chronic lower extremity pain (2ry area of Pain) (Bilateral)   4. Chronic radicular pain of lower extremity (Bilateral)   5. 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 Lumbar Grade 1 Anterolisthesis of L3/L4 and L4/L5   8. Lumbar lateral recess stenosis (Bilateral) (L2-3, L3-4, L4-5) (Severe: L3-4)   9. Lumbar spinal stenosis (5 mm Severe L3-4; 8 mm L4-5) w/ neurogenic claudication   10. Lumbosacral radiculopathy at L5 (Left)   11. Morbid obesity  with BMI of 40.0-44.9, adult (HCC)   12. Chronic anticoagulation (Plaquenil)    Pain Score: Pre-procedure: 6 /10 Post-procedure: 0-No pain/10    Pre-op H&P Assessment:  Laura [name] is a 75 y.o. (year old), female patient, seen today for interventional treatment. She  has a past surgical history that includes Abdominal hysterectomy; Cesarean section; Replacement total knee (Left); and Hip surgery (Right). Laura [name] has a current medication list which includes the following prescription(s): albuterol, aspirin ec, azelastine, chlorthalidone, vitamin d, doxycycline, duloxetine, empagliflozin, flovent diskus, folic acid, hydroxychloroquine, hydroxyzine, hydroxyzine, infliximab, levothyroxine, levothyroxine, loratadine, methotrexate, metoprolol succinate, multi-vitamins, oxycodone, [START ON 01/09/2021] oxycodone, [START ON 02/08/2021] oxycodone, rosuvastatin, spironolactone, and synthroid. Her primarily concern today is the Back Pain (low)  Initial Vital Signs:  Pulse/HCG Rate: 91ECG Heart Rate: 86 Temp: (!) 97 F (36.1 C) Resp: 15 BP: 100/67 SpO2: 93 %  BMI: Estimated body mass index is 38.27 kg/m as calculated from the following:   Height as of this encounter: 5\' 5"  (1.651 m).   Weight as of this encounter: 230 lb (104.3 kg).  Risk Assessment: Allergies: Reviewed. She is allergic to bupropion and penicillins.  Allergy Precautions: None required Coagulopathies: Reviewed. None identified.  Blood-thinner therapy: None at this time Active Infection(s): Reviewed. None identified. Laura [name] is afebrile  Site Confirmation: Laura [name] was asked to confirm the procedure and laterality before marking the site Procedure checklist: Completed Consent: Before the procedure and under the influence of no sedative(s), amnesic(s), or anxiolytics, the patient was informed of the treatment options, risks and possible complications. To fulfill our ethical and legal obligations, as recommended by the  American Medical Association's Code of Ethics,  I have informed the patient of my clinical impression; the nature and purpose of the treatment or procedure; the risks, benefits, and possible complications of the intervention; the alternatives, including doing nothing; the risk(s) and benefit(s) of the alternative treatment(s) or procedure(s); and the risk(s) and benefit(s) of doing nothing. The patient was provided information about the general risks and possible complications associated with the procedure. These may include, but are not limited to: failure to achieve desired goals, infection, bleeding, organ or nerve damage, allergic reactions, paralysis, and death. In addition, the patient was informed of those risks and complications associated to Spine-related procedures, such as failure to decrease pain; infection (i.e.: Meningitis, epidural or intraspinal abscess); bleeding (i.e.: epidural hematoma, subarachnoid hemorrhage, or any other type of intraspinal or peri-dural bleeding); organ or nerve damage (i.e.: Any type of peripheral nerve, nerve root, or spinal cord injury) with subsequent damage to sensory, motor, and/or autonomic systems, resulting in permanent pain, numbness, and/or weakness of one or several areas of the body; allergic reactions; (i.e.: anaphylactic reaction); and/or death. Furthermore, the patient was informed of those risks and complications associated with the medications. These include, but are not limited to: allergic reactions (i.e.: anaphylactic or anaphylactoid reaction(s)); adrenal axis suppression; blood sugar elevation that in diabetics may result in ketoacidosis or comma; water retention that in patients with history of congestive heart failure may result in shortness of breath, pulmonary edema, and decompensation with resultant heart failure; weight gain; swelling or edema; medication-induced neural toxicity; particulate matter embolism and blood vessel occlusion with  resultant organ, and/or nervous system infarction; and/or aseptic necrosis of one or more joints. Finally, the patient was informed that Medicine is not an exact science; therefore, there is also the possibility of unforeseen or unpredictable risks and/or possible complications that may result in a catastrophic outcome. The patient indicated having understood very clearly. We have given the patient no guarantees and we have made no promises. Enough time was given to the patient to ask questions, all of which were answered to the patient's satisfaction. Laura [name] has indicated that she wanted to continue with the procedure. Attestation: I, the ordering provider, attest that I have discussed with the patient the benefits, risks, side-effects, alternatives, likelihood of achieving goals, and potential problems during recovery for the procedure that I have provided informed consent. Date  Time: 12/22/2020 10:21 AM  Pre-Procedure Preparation:  Monitoring: As per clinic protocol. Respiration, ETCO2, SpO2, BP, heart rate and rhythm monitor placed and checked for adequate function Safety Precautions: Patient was assessed for positional comfort and pressure points before starting the procedure. Time-out: I initiated and conducted the "Time-out" before starting the procedure, as per protocol. The patient was asked to participate by confirming the accuracy of the "Time Out" information. Verification of the correct person, site, and procedure were performed and confirmed by me, the nursing staff, and the patient. "Time-out" conducted as per Joint Commission's Universal Protocol (UP.01.01.01). Time: 1140  Description of Procedure:          Target Area: The interlaminar space, initially targeting the lower laminar border of the superior vertebral body. Approach: Paramedial approach. Area Prepped: Entire Posterior Lumbar Region DuraPrep (Iodine Povacrylex [0.7% available iodine] and Isopropyl Alcohol, 74%  w/w) Safety Precautions: Aspiration looking for blood return was conducted prior to all injections. At no point did we inject any substances, as a needle was being advanced. No attempts were made at seeking any paresthesias. Safe injection practices and needle disposal techniques used. Medications properly checked  for expiration dates. SDV (single dose vial) medications used. Description of the Procedure: Protocol guidelines were followed. The procedure needle was introduced through the skin, ipsilateral to the reported pain, and advanced to the target area. Bone was contacted and the needle walked caudad, until the lamina was cleared. The epidural space was identified using "loss-of-resistance technique" with 2-3 ml of PF-NaCl (0.9% NSS), in a 5cc LOR glass syringe.  Vitals:   12/22/20 1144 12/22/20 1147 12/22/20 1154 12/22/20 1201  BP: (!) 145/120 (!) 148/122 (!) 150/113 127/83  Pulse:      Resp: 14 12 (!) 21 18  Temp:      SpO2: 100% 100% 95% 96%  Weight:      Height:        Start Time: 1140 hrs. End Time: 1149 hrs.  Materials:  Needle(s) Type: Epidural needle Gauge: 17G Length: 15cm Medication(s): Please see orders for medications and dosing details.  Imaging Guidance (Spinal):          Type of Imaging Technique: Fluoroscopy Guidance (Spinal) Indication(s): Assistance in needle guidance and placement for procedures requiring needle placement in or near specific anatomical locations not easily accessible without such assistance. Exposure Time: Please see nurses notes. Contrast: Before injecting any contrast, we confirmed that the patient did not have an allergy to iodine, shellfish, or radiological contrast. Once satisfactory needle placement was completed at the desired level, radiological contrast was injected. Contrast injected under live fluoroscopy. No contrast complications. See chart for type and volume of contrast used. Fluoroscopic Guidance: I was personally present during  the use of fluoroscopy. "Tunnel Vision Technique" used to obtain the best possible view of the target area. Parallax error corrected before commencing the procedure. "Direction-depth-direction" technique used to introduce the needle under continuous pulsed fluoroscopy. Once target was reached, antero-posterior, oblique, and lateral fluoroscopic projection used confirm needle placement in all planes. Images permanently stored in EMR. Interpretation: I personally interpreted the imaging intraoperatively. Adequate needle placement confirmed in multiple planes. Appropriate spread of contrast into desired area was observed. No evidence of afferent or efferent intravascular uptake. No intrathecal or subarachnoid spread observed. Permanent images saved into the patient's record.  Antibiotic Prophylaxis:   Anti-infectives (From admission, onward)    None      Indication(s): None identified  Post-operative Assessment:  Post-procedure Vital Signs:  Pulse/HCG Rate: 9186 Temp: (!) 97 F (36.1 C) Resp: 18 BP: 127/83 SpO2: 96 %  EBL: None  Complications: No immediate post-treatment complications observed by team, or reported by patient.  Note: The patient tolerated the entire procedure well. A repeat set of vitals were taken after the procedure and the patient was kept under observation following institutional policy, for this type of procedure. Post-procedural neurological assessment was performed, showing return to baseline, prior to discharge. The patient was provided with post-procedure discharge instructions, including a section on how to identify potential problems. Should any problems arise concerning this procedure, the patient was given instructions to immediately contact us, at any time, without hesitation. In any case, we plan to contact the patient by telephone for a follow-up status report regarding this interventional procedure.  Comments:  No additional relevant information.  Plan of  Care  Orders:  Orders Placed This Encounter  Procedures   Lumbar Epidural Injection    Scheduling Instructions:     Procedure: Interlaminar LESI L2-3     Laterality: Midline     Sedation: Patient's choice     Timeframe: Today    Order Specific Question:  Where will this procedure be performed?    Answer:   [facility]   [facility] C-ARM 1-60 MIN NO REPORT    Intraoperative interpretation by procedural physician at [facility].    Standing Status:   Standing    Number of Occurrences:   1    Order Specific Question:   Reason for exam:    Answer:   Assistance in needle guidance and placement for procedures requiring needle placement in or near specific anatomical locations not easily accessible without such assistance.   Ambulatory referral to Neurosurgery    Referral Priority:   Routine    Referral Type:   Surgical    Referral Reason:   Specialty Services Required    Requested Specialty:   Neurosurgery    Number of Visits Requested:   1   Informed Consent Details: Physician/Practitioner Attestation; Transcribe to consent form and obtain patient signature    Note: Always confirm laterality of pain with Laura [name], before procedure. Transcribe to consent form and obtain patient signature.    Order Specific Question:   Physician/Practitioner attestation of informed consent for procedure/surgical case    Answer:   I, the physician/practitioner, attest that I have discussed with the patient the benefits, risks, side effects, alternatives, likelihood of achieving goals and potential problems during recovery for the procedure that I have provided informed consent.    Order Specific Question:   Procedure    Answer:   Lumbar epidural steroid injection under fluoroscopic guidance    Order Specific Question:   Physician/Practitioner performing the procedure    Answer:   Blakely Maranan A. Dossie Arbour, MD    Order Specific Question:   Indication/Reason    Answer:   Low back and/or  lower extremity pain secondary to lumbar radiculitis   Care order/instruction: Please confirm that the patient has stopped the Plaquenil (Hydroxychloroquine) x 11 days prior to procedure or surgery.    Please confirm that the patient has stopped the Plaquenil (Hydroxychloroquine) x 11 days prior to procedure or surgery.    Standing Status:   Standing    Number of Occurrences:   1   Provide equipment / supplies at bedside    "Epidural Tray" (Disposable  single use) Catheter: NOT required    Standing Status:   Standing    Number of Occurrences:   1    Order Specific Question:   Specify    Answer:   Epidural Tray   Bleeding precautions    Standing Status:   Standing    Number of Occurrences:   1    Chronic Opioid Analgesic:  Oxycodone IR 5 mg every 6 hours (20 mg/day) MME/day: 30 mg/day.   Medications ordered for procedure: Meds ordered this encounter  Medications   iohexol (OMNIPAQUE) 180 MG/ML injection 10 mL    Must be Myelogram-compatible. If not available, you may substitute with a water-soluble, non-ionic, hypoallergenic, myelogram-compatible radiological contrast medium.   lidocaine (XYLOCAINE) 2 % (with pres) injection 400 mg   DISCONTD: lactated ringers infusion 1,000 mL   DISCONTD: midazolam (VERSED) 5 MG/5ML injection 0.5-2 mg    Make sure Flumazenil is available in the pyxis when using this medication. If oversedation occurs, administer 0.2 mg IV over 15 sec. If after 45 sec no response, administer 0.2 mg again over 1 min; may repeat at 1 min intervals; not to exceed 4 doses (1 mg)   sodium chloride flush (NS) 0.9 % injection 2 mL   ropivacaine (PF) 2  mg/mL (0.2%) (NAROPIN) injection 2 mL   triamcinolone acetonide (KENALOG-40) injection 40 mg    Medications administered: We administered iohexol, lidocaine, sodium chloride flush, ropivacaine (PF) 2 mg/mL (0.2%), and triamcinolone acetonide.  See the medical record for exact dosing, route, and time of  administration.  Follow-up plan:   Return in about 2 weeks (around 01/05/2021) for Proc-day (T,Th), (VV), (PPE).       Interventional Therapies  Risk  Complexity Considerations:   Estimated body mass index is 41.16 kg/m as calculated from the following:   Height as of this encounter: 5\' 6"  (1.676 m).   Weight as of this encounter: 255 lb (115.7 kg). PLAQUENIL Anticoagulation (Stop: 11 days)   Planned  Pending:   Therapeutic midline L2-3 LESI #3    Under consideration:   Diagnostic bilateral genicular NB  Diagnostic bilateral L3 and/or L5 TFESI    Completed:   Therapeutic right IA steroid knee injection x2 (10/15/2015)  Therapeutic right Hyalgan knee injection x1 (09/01/2016)  Therapeutic left lumbar facet block x1 (01/22/2015)  Therapeutic left IA hip injection x1 (03/17/2015)  Therapeutic left L4-5 LESI x1 (02/04/2020)  Therapeutic midline L2-3 LESI x2 (05/28/2020)    Therapeutic  Palliative (PRN) options:   Therapeutic right IA Hyalgan knee injections Palliative left IA hip joint injection  Diagnostic left lumbar facet block #2       Recent Visits Date Type Provider Dept  12/09/20 Office Visit Milinda Pointer, MD [facility]  Showing recent visits within past 90 days and meeting all other requirements Today's Visits Date Type Provider Dept  12/22/20 Procedure visit Milinda Pointer, MD [facility]  Showing today's visits and meeting all other requirements Future Appointments Date Type Provider Dept  01/07/21 Appointment Milinda Pointer, MD [facility]  Showing future appointments within next 90 days and meeting all other requirements Disposition: Discharge home  Discharge (Date  Time): 12/22/2020; 1203 hrs.   Primary Care Physician: Adin [facility], MD Location: [facility] Note by: Gaspar Cola, MD Date: 12/22/2020; Time: 3:05 PM  Disclaimer:  Medicine is not an Chief Strategy Officer. The only  guarantee in medicine is that nothing is guaranteed. It is important to note that the decision to proceed with this intervention was based on the information collected from the patient. The Data and conclusions were drawn from the patient's questionnaire, the interview, and the physical examination. Because the information was provided in large part by the patient, it cannot be guaranteed that it has not been purposely or unconsciously manipulated. Every effort has been made to obtain as much relevant data as possible for this evaluation. It is important to note that the conclusions that lead to this procedure are derived in large part from the available data. Always take into account that the treatment will also be dependent on availability of resources and existing treatment guidelines, considered by other Pain Management Practitioners as being common knowledge and practice, at the time of the intervention. For Medico-Legal purposes, it is also important to point out that variation in procedural techniques and pharmacological choices are the acceptable norm. The indications, contraindications, technique, and results of the above procedure should only be interpreted and judged by a Board-Certified Interventional Pain Specialist with extensive familiarity and expertise in the same exact procedure and technique.

## 2020-12-23 ENCOUNTER — Ambulatory Visit: Payer: Medicare Other | Admitting: Psychiatry

## 2020-12-23 ENCOUNTER — Telehealth: Payer: Self-pay

## 2020-12-23 NOTE — Telephone Encounter (Signed)
Post procedure phone call.  Patients caregiver states she is doing very well.

## 2020-12-28 ENCOUNTER — Ambulatory Visit: Payer: Medicare Other | Admitting: Nurse Practitioner

## 2020-12-28 ENCOUNTER — Telehealth: Payer: Self-pay

## 2020-12-28 DIAGNOSIS — F5101 Primary insomnia: Secondary | ICD-10-CM

## 2020-12-28 MED ORDER — HYDROXYZINE PAMOATE 25 MG PO CAPS: 75.0000 mg | ORAL_CAPSULE | Freq: Every evening | ORAL | 0 refills | Status: DC | PRN

## 2020-12-28 NOTE — Telephone Encounter (Signed)
pt called and left a message that she needed a refill on the vistaril

## 2020-12-28 NOTE — Telephone Encounter (Signed)
I have sent 1 month supply of hydroxyzine.  No future refills will be provided since this patient has been referred out.

## 2020-12-29 ENCOUNTER — Telehealth: Payer: Self-pay

## 2020-12-29 NOTE — Telephone Encounter (Signed)
left message for patient that medication had been sent for a one month supply and that she needed to get medication from new provider.

## 2020-12-29 NOTE — Telephone Encounter (Signed)
she wanted to know whats going on.  she states that she not seeing anyone else she states that she hurt her leg and not been able to walk and that she doesn't understand what going on

## 2020-12-29 NOTE — Telephone Encounter (Signed)
As you already know we referred this patient to a sleep specialist and asked her to go to them for everything including her psychotropic med management since this was a psychiatrist with [facility]. If she had any problem with that referral she should have atleast let us know.

## 2020-12-30 NOTE — Telephone Encounter (Signed)
Medication management - Telephone call with pt, after she left another message wanting to speak to Dr. Elna Breslow.  Pt. stated she was not able to follow up with [facility] for a sleep study or further psychiatric services.  Reports she was down with her leg recently after hurting it when she fell.  Reports she had fallen 4 times so then was unable to get out and also that her husband ended up in the hospital for 6 weeks with a wound on his foot, due to his diabetes. Patient reported she was unable to travel and to see anyone else and wanted to apologize for this to Dr. Elna Breslow but to also let her know she is still having sleep issues and would like to discuss this and continued treatment.  Agreed to send message to Dr.Eappan for patient.

## 2020-12-30 NOTE — Telephone Encounter (Signed)
Shawn please ask her to schedule an appointment with me tomorrow , we can do virtual - I have open slots. Thank you.

## 2020-12-30 NOTE — Telephone Encounter (Signed)
Appointment - Telephone call with patient to discuss setting up a new appointment for her on 12/31/20, per Dr. Elvera Maria request.  Patient agreed to a virtual visit at 10:00 am on 12/31/20.

## 2020-12-31 ENCOUNTER — Other Ambulatory Visit: Payer: Self-pay

## 2020-12-31 ENCOUNTER — Encounter: Payer: Self-pay | Admitting: Psychiatry

## 2020-12-31 ENCOUNTER — Telehealth (INDEPENDENT_AMBULATORY_CARE_PROVIDER_SITE_OTHER): Payer: Medicare Other | Admitting: Psychiatry

## 2020-12-31 ENCOUNTER — Encounter: Payer: Self-pay | Admitting: Pain Medicine

## 2020-12-31 DIAGNOSIS — F3342 Major depressive disorder, recurrent, in full remission: Secondary | ICD-10-CM | POA: Diagnosis not present

## 2020-12-31 DIAGNOSIS — F5101 Primary insomnia: Secondary | ICD-10-CM

## 2020-12-31 MED ORDER — ZALEPLON 10 MG PO CAPS: 10.0000 mg | ORAL_CAPSULE | Freq: Every evening | ORAL | 1 refills | Status: DC | PRN

## 2020-12-31 NOTE — Patient Instructions (Signed)
Zaleplon Capsules What is this medication? ZALEPLON (ZAL e plon) treats insomnia. It helps you go to sleep faster. It is often used for a short time. This medicine may be used for other purposes; ask your health care provider or pharmacist if you have questions. COMMON BRAND NAME(S): Sonata What should I tell my care team before I take this medication? They need to know if you have any of these conditions: Depression History of a drug or alcohol abuse problem Liver disease Lung or breathing disease Sleep-walking, driving, eating or other activity while not fully awake after taking a sleep medication Suicidal thoughts An unusual or allergic reaction to zaleplon, other medications, foods, dyes, or preservatives Pregnant or trying to get pregnant Breast-feeding How should I use this medication? Take this medication by mouth with water. Take it as directed on the label. Do not use it more often than directed. There may be unused or extra doses in the bottle after you finish your treatment. Talk to your care team if you have questions about your dose. A special MedGuide will be given to you by the pharmacist with each prescription and refill. Be sure to read this information carefully each time. Talk to your care team about the use of this medication in children. Special care may be needed. Overdosage: If you think you have taken too much of this medicine contact a poison control center or emergency room at once. NOTE: This medicine is only for you. Do not share this medicine with others. What if I miss a dose? This does not apply. This medication should only be taken immediately before going to sleep. Do not take double or extra doses. What may interact with this medication? Barbiturate medications for inducing sleep or treating seizures Carbamazepine Certain medications for allergies, like azatadine, clemastine, diphenhydramine Certain medications for depression, anxiety, or other emotional  or psychiatric problems Certain medications for pain Cimetidine Erythromycin Medications for fungal infections like ketoconazole, fluconazole, or itraconazole Other medications given for sleep Phenytoin Rifampin This list may not describe all possible interactions. Give your health care provider a list of all the medicines, herbs, non-prescription drugs, or dietary supplements you use. Also tell them if you smoke, drink alcohol, or use illegal drugs. Some items may interact with your medicine. What should I watch for while using this medication? Visit your care team for regular checks on your progress. Keep a regular sleep schedule by going to bed at about the same time each night. Avoid caffeine-containing drinks in the evening hours. When sleep medications are used every night for more than a few weeks, they may stop working. Talk to your care team if you still have trouble sleeping. After taking this medication, you may get up out of bed and do an activity that you do not know you are doing. The next morning, you may have no memory of this. Activities include driving a car ("sleep-driving"), making and eating food, talking on the phone, sexual activity, and sleep-walking. Serious injuries have occurred. Stop the medication and call your care team right away if you find out you have done any of these activities. Do not take this medication if you have used alcohol that evening. Do not take it if you have taken another medication for sleep. The risk of doing these sleep-related activities is higher. Do not take this medication unless you are able to stay in bed for a full night (7 to 8 hours) before you must be active again. You may have a   decrease in mental alertness the day after use, even if you feel that you are fully awake. Tell your care team if you will need to perform activities requiring full alertness, such as driving, the next day. Do not stand or sit up quickly after taking this medication,  especially if you are an older patient. This reduces the risk of dizzy or fainting spells. If you or your family notice any changes in your behavior, such as new or worsening depression, thoughts of harming yourself, anxiety, other unusual or disturbing thoughts, or memory loss, call your care team right away. After you stop taking this medication, you may have trouble falling asleep. This is called rebound insomnia. This problem usually goes away on its own after 1 or 2 nights. What side effects may I notice from receiving this medication? Side effects that you should report to your care team as soon as possible: Allergic reactions--skin rash, itching, hives, swelling of the face, lips, tongue, or throat Change in vision such as blurry vision, seeing halos around lights, vision loss CNS depression--slow or shallow breathing, shortness of breath, feeling faint, dizziness, confusion, difficulty staying awake Mood and behavior changes--anxiety, nervousness, confusion, hallucinations, irritability, hostility, thoughts of suicide or self-harm, worsening mood, feelings of depression Unusual sleep behaviors or activities you do not remember such as driving, eating, or sexual activity Side effects that usually do not require medical attention (report to your care team if they continue or are bothersome): Diarrhea Dizziness Drowsiness the day after use Pain, tingling, or numbness in the hands or feet This list may not describe all possible side effects. Call your doctor for medical advice about side effects. You may report side effects to FDA at 1-800-FDA-1088. Where should I keep my medication? Keep out of the reach of children and pets. This medication can be abused. Keep your medication in a safe place to protect it from theft. Do not share this medication with anyone. Selling or giving away this medication is dangerous and against the law. Store at room temperature between 20 and 25 degrees C (68 and  77 degrees F). Protect from light. This medication may cause accidental overdose and death if taken by other adults, children, or pets. Mix any unused medication with a substance like cat litter or coffee grounds. Then throw the medication away in a sealed container like a sealed bag or a coffee can with a lid. Do not use the medication after the expiration date. NOTE: This sheet is a summary. It may not cover all possible information. If you have questions about this medicine, talk to your doctor, pharmacist, or health care provider.  2022 Elsevier/Gold Standard (2020-01-24 00:00:00)  

## 2020-12-31 NOTE — Progress Notes (Signed)
Virtual Visit via Telephone Note  I connected with Laura [name] on 12/31/20 at 10:00 AM EST by telephone and verified that I am speaking with the correct person using two identifiers. Location Provider Location : [facility] Patient Location : Home  Participants: Patient , Provider    I discussed the limitations, risks, security and privacy concerns of performing an evaluation and management service by telephone and the availability of in person appointments. I also discussed with the patient that there may be a patient responsible charge related to this service. The patient expressed understanding and agreed to proceed.   I discussed the assessment and treatment plan with the patient. The patient was provided an opportunity to ask questions and all were answered. The patient agreed with the plan and demonstrated an understanding of the instructions.   The patient was advised to call back or seek an in-person evaluation if the symptoms worsen or if the condition fails to improve as anticipated.   BH MD OP Progress Note  12/31/2020 10:00 AM Laura [name]  MRN:  [id number]  Chief Complaint:  Chief Complaint   Follow-up; Depression; Insomnia    HPI: Laura [name] is a 75 year old Caucasian female who has a history of MDD, primary insomnia, chronic pain was evaluated by telemedicine today.  Patient was last evaluated on 09/28/2020.  After that visit patient was referred to [facility] sleep specialist since she had tried and failed multiple sleep medication trials and was agreeable to being referred.  Patient today returns reporting that she did not follow through with recommendations for the referral.  She reports she had multiple falls because of her physical problems, arthritis.  She reports her husband had medical problems and was hospitalized for a month and is currently wheelchair-bound.  She herself does not ambulate and is wheelchair bound and hence she decided not to change her  providers, or go to [facility] sleep [facility].  Patient reports she currently has home health aide who comes in from 9 AM to 2 PM and 2 PM to 6 PM.  That has been very helpful.  She plans to spend the holiday season with her family will be coming to visit.  She is compliant on the Cymbalta and reports mood symptoms are currently manageable.  Continues to struggle with sleep.  She reports she is interested in trying the zaleplon again.  She feels it may have helped in the past.  Denies any suicidality, homicidality or perceptual disturbances.  Patient appeared to be alert, oriented to person place time and situation.  Patient denies any other concerns today.  Visit Diagnosis:    ICD-10-CM   1. MDD (major depressive disorder), recurrent, in full remission (HCC)  F33.42     2. Primary insomnia  F51.01       Past Psychiatric History: Reviewed past psychiatric history from progress note on 06/13/2018  Past Medical History:  Past Medical History:  Diagnosis Date   Allergy    Anxiety    Arthritis, degenerative 10/08/2013   Overview:    a.  Lumbar spine/spinal stenosis/foot drop.   b.  Hands.    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   Memory loss, short term 03/19/2014   Sciatica of left side 12/31/2019   Seizure (HCC) 10/07/2014   Sleep apnea    Sleep apnea  Thyroid disease     Past Surgical History:  Procedure Laterality Date   ABDOMINAL HYSTERECTOMY     CESAREAN SECTION     HIP SURGERY Right    REPLACEMENT TOTAL KNEE Left     Family Psychiatric History: Reviewed family psychiatric history from 06/13/2018  Family History:  Family History  Problem Relation Age of Onset   Hypertension Mother    Stroke Mother    Heart attack Father    Alcohol abuse Father    Depression Father    Post-traumatic  stress disorder Father    Rheum arthritis Sister    Hypertension Sister    Depression Sister    Breast cancer Neg Hx     Social History: Reviewed social history from progress note on 06/13/2018 Social History   Socioeconomic History   Marital status: Married    Spouse name: Not on file   Number of children: Not on file   Years of education: Not on file   Highest education level: Not on file  Occupational History    Comment: retired  Tobacco Use   Smoking status: Never   Smokeless tobacco: Never  Vaping Use   Vaping Use: Never used  Substance and Sexual Activity   Alcohol use: No    Alcohol/week: 0.0 standard drinks   Drug use: No   Sexual activity: Not Currently  Other Topics Concern   Not on file  Social History Narrative   Not on file   Social Determinants of Health   Financial Resource Strain: Not on file  Food Insecurity: Not on file  Transportation Needs: Not on file  Physical Activity: Not on file  Stress: Not on file  Social Connections: Not on file    Allergies:  Allergies  Allergen Reactions   Bupropion    Penicillins Rash    Metabolic Disorder Labs: No results found for: HGBA1C, MPG No results found for: PROLACTIN No results found for: CHOL, TRIG, HDL, CHOLHDL, VLDL, LDLCALC Lab Results  Component Value Date   TSH 0.383 10/07/2018    Therapeutic Level Labs: No results found for: LITHIUM No results found for: VALPROATE No components found for:  CBMZ  Current Medications: Current Outpatient Medications  Medication Sig Dispense Refill   albuterol (VENTOLIN HFA) 108 (90 Base) MCG/ACT inhaler Inhale 2 puffs into the lungs every 6 (six) hours as needed for wheezing or shortness of breath. 18 g 3   aspirin EC 81 MG tablet Take by mouth.     azelastine (ASTELIN) 0.1 % nasal spray Place into the nose.     chlorthalidone (HYGROTON) 25 MG tablet TAKE 1/2 TABLET (12.5MG ) BY MOUTH EVERY DAY  11   Cholecalciferol (VITAMIN D) 2000 UNITS tablet Take by  mouth daily.      doxycycline (MONODOX) 100 MG capsule Take 100 mg by mouth 2 (two) times daily.     DULoxetine (CYMBALTA) 60 MG capsule Take 1 capsule (60 mg total) by mouth daily. 90 capsule 1   empagliflozin (JARDIANCE) 10 MG TABS tablet Take 1 tablet by mouth daily.     FLOVENT DISKUS 100 MCG/BLIST AEPB SMARTSIG:1 Inhalation Via Inhaler Twice Daily     folic acid (FOLVITE) 1 MG tablet Take 1 mg by mouth daily.     hydroxychloroquine (PLAQUENIL) 200 MG tablet Take 100 mg by mouth daily.      hydrOXYzine (VISTARIL) 25 MG capsule Take 3 capsules (75 mg total) by mouth at bedtime as needed. 90 capsule 0   inFLIXimab (REMICADE) 100  MG injection Inject 100 mg into the vein every 8 (eight) weeks.     levothyroxine (SYNTHROID) 50 MCG tablet Take 25 mcg by mouth at bedtime.     levothyroxine (SYNTHROID, LEVOTHROID) 200 MCG tablet 200 mcg daily before breakfast.  5   loratadine (CLARITIN) 10 MG tablet Take 10 mg by mouth daily.      methotrexate (RHEUMATREX) 2.5 MG tablet TAKE 4 TABLETS (10 MG TOTAL) BY MOUTH EVERY 7 (SEVEN) DAYS WITH A MEAL  5   metoprolol succinate (TOPROL-XL) 100 MG 24 hr tablet Take 1 tablet (100 mg total) by mouth daily. Take with or immediately following a meal. 30 tablet 0   Multiple Vitamin (MULTI-VITAMINS) TABS Take by mouth.     oxyCODONE (OXY IR/ROXICODONE) 5 MG immediate release tablet Take 1 tablet (5 mg total) by mouth every 6 (six) hours as needed for severe pain. Must last 30 days 120 tablet 0   [START ON 01/09/2021] oxyCODONE (OXY IR/ROXICODONE) 5 MG immediate release tablet Take 1 tablet (5 mg total) by mouth every 6 (six) hours as needed for severe pain. Must last 30 days 120 tablet 0   [START ON 02/08/2021] oxyCODONE (OXY IR/ROXICODONE) 5 MG immediate release tablet Take 1 tablet (5 mg total) by mouth every 6 (six) hours as needed for severe pain. Must last 30 days 120 tablet 0   rosuvastatin (CRESTOR) 5 MG tablet Take 5 mg by mouth 2 (two) times a week.      spironolactone (ALDACTONE) 25 MG tablet TAKE 1/2 (ONE-HALF) TABLET BY MOUTH DAILY  11   SYNTHROID 125 MCG tablet Take by mouth.     No current facility-administered medications for this visit.     Musculoskeletal: Strength & Muscle Tone:  UTA Gait & Station:  UTA Patient leans: N/A  Psychiatric Specialty Exam: Review of Systems  Musculoskeletal:  Positive for arthralgias and back pain.  Psychiatric/Behavioral:  Positive for sleep disturbance. The patient is nervous/anxious.   All other systems reviewed and are negative.  There were no vitals taken for this visit.There is no height or weight on file to calculate BMI.  General Appearance:  UTA  Eye Contact:   UTA  Speech:  Clear and Coherent  Volume:  Normal  Mood:  Anxious  Affect:   UTA  Thought Process:  Goal Directed and Descriptions of Associations: Intact  Orientation:  Full (Time, Place, and Person)  Thought Content: Logical   Suicidal Thoughts:  No  Homicidal Thoughts:  No  Memory:  Immediate;   Fair Recent;   Fair Remote;   Fair  Judgement:  Fair  Insight:  Fair  Psychomotor Activity:   UTA  Concentration:  Concentration: Fair and Attention Span: Fair  Recall:  [employer] of Knowledge: Fair  Language: Fair  Akathisia:  No  Handed:  Right  AIMS (if indicated): not done  Assets:  Communication Skills Desire for Improvement Social Support Transportation  ADL's:  Intact  Cognition: WNL  Sleep:  Poor   Screenings: AIMS    Flowsheet Row [facility] Visit from 04/28/2020 in [facility]  AIMS Total Score 0      PHQ2-9    Flowsheet Row Procedure visit from 12/22/2020 in [facility] Office Visit from 12/09/2020 in [facility] Procedure visit from 07/07/2020 in [facility] Procedure visit from 05/28/2020 in [facility] Office Visit from 04/28/2020  in [facility]  PHQ-2 Total Score 0 0 0 0 2  PHQ-9 Total Score -- -- -- -- 5      Flowsheet Row ED from 11/14/2020 in [facility] Office Visit from 04/28/2020 in [facility] Video Visit from 03/24/2020 in [facility]  C-SSRS RISK CATEGORY No Risk No Risk No Risk        Assessment and Plan: Laura [name] is a 75 year old Caucasian female, married, retired, lives in [place], has a history of depression, multiple medical problems, chronic pain, rheumatoid arthritis was evaluated by telemedicine today.  Patient continues to struggle with sleep.  Discussed plan as noted below.  Plan MDD in remission Cymbalta 60 mg p.o. daily-reduced dosage  Primary insomnia-unstable Restart zaleplon 10 to 20 mg p.o. nightly as needed Continue hydroxyzine 25 mg p.o. nightly as needed Provided medication education, discussed risk factors including falls. Reviewed [place] PMP aware  Follow-up in clinic in 2 weeks or sooner.   I have spent at least 14 minutes non face to face with patient today.   This note was generated in part or whole with voice recognition software. Voice recognition is usually quite accurate but there are transcription errors that can and very often do occur. I apologize for any typographical errors that were not detected and corrected.       Jomarie Longs, MD 12/31/2020, 10:00 AM

## 2021-01-06 ENCOUNTER — Telehealth: Payer: Self-pay

## 2021-01-06 NOTE — Progress Notes (Signed)
Patient: Laura [name]  Service Category: E/M  Provider: Gaspar Cola, MD  DOB: 1945/10/17  DOS: 01/07/2021  Location: Office  MRN: [id number]  Setting: Ambulatory outpatient  Referring Provider: Adin Hector, MD  Type: Established Patient  Specialty: Interventional Pain Management  PCP: Adin Hector, MD  Location: Remote location  Delivery: TeleHealth     Virtual Encounter - Pain Management PROVIDER NOTE: Information contained herein reflects review and annotations entered in association with encounter. Interpretation of such information and data should be left to medically-trained personnel. Information provided to patient can be located elsewhere in the medical record under "Patient Instructions". Document created using STT-dictation technology, any transcriptional errors that may result from process are unintentional.    Contact & Pharmacy Preferred: [phone] Home: [phone] (home) Mobile: [phone] (mobile) E-mail: [email or web address]  [id number] [id number]: [phone]: [phone]  Pre-screening  Laura [name] offered "in-person" vs "virtual" encounter. She indicated preferring virtual for this encounter.   Reason COVID-19*   Social distancing based on CDC and AMA recommendations.   I contacted Laura Palominoon 01/07/2021 via telephone.      I clearly identified myself as FGaspar Cola MD. I verified that I was speaking with the correct person using two identifiers (Name: ELan Entsminger and date of birth: [id number].  Consent I sought verbal advanced consent from Laura Rankinsfor virtual visit interactions. I informed Laura [name] of possible security and privacy concerns, risks, and limitations associated with providing "not-in-person" medical evaluation and management services. I also informed Laura [name] of the availability of "in-person" appointments.  Finally, I informed her that there would be a charge for the virtual visit and that she could be  personally, fully or partially, financially responsible for it. Ms. ADimarioexpressed understanding and agreed to proceed.   Historic Elements   Ms. ECristianna Cyris a 75y.o. year old, female patient evaluated today after our last contact on 12/22/2020. Ms. ASprick has a past medical history of Allergy, Anxiety, Arthritis, degenerative (10/08/2013), Asthma, Chronic hand pain, left (10/10/2019), Chronic kidney disease, Depression, Lumbar spinal stenosis with neurogenic claudication (11/07/2014), Major depression, single episode, in complete remission ([place] (06/25/2015), Memory loss, short term (03/19/2014), Sciatica of left side (12/31/2019), [place]), Sleep apnea, Sleep apnea, and Thyroid disease. She also  has a past surgical history that includes Abdominal hysterectomy; Cesarean section; Replacement total knee (Left); and Hip surgery (Right). Ms. AExlinehas a current medication list which includes the following prescription(s): albuterol, aspirin ec, azelastine, chlorthalidone, vitamin d, doxycycline, duloxetine, empagliflozin, flovent diskus, folic acid, hydroxychloroquine, hydroxyzine, infliximab, levothyroxine, levothyroxine, loratadine, methotrexate, metoprolol succinate, multi-vitamins, oxycodone, [START ON 01/09/2021] oxycodone, [START ON 02/08/2021] oxycodone, rosuvastatin, spironolactone, synthroid, and zaleplon. She  reports that she has never smoked. She has never used smokeless tobacco. She reports that she does not drink alcohol and does not use drugs. Ms. AToporis allergic to bupropion and penicillins.   HPI  Today, she is being contacted for a post-procedure assessment.  The patient refers having attained 95% relief of the pain for the duration of the local anesthetic which in fact has persisted and as of today, she still enjoying an ongoing 95% relief of her pain.  However, she  also indicated that she is having difficulty ambulating secondary to her left foot numbness and weakness.  She seems to be having an L5 radiculopathy.  She does have multiple  areas of severe spinal stenosis that were documented on her 12/26/2019 lumbar MRI.  On 02/19/2020 she was referred to neurosurgery (Dr. Cari Caraway).  She was seen on an initial evaluation on 03/27/2020.  At the time he recommended losing weight, but he also discussed the possibility of peroneal nerve decompression and recommended nerve conduction testing.  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  On 06/11/2020 she was reevaluated by Dr. Cari Caraway who recommended therapy, but did not recommend decompression surgery.  On 07/07/2020 I referred her to [facility] for an evaluation since the patient still had some reservations regarding the recommendations by Dr. Cari Caraway.  Apparently the patient did not follow-up with this referral and on 12/22/2020 I did a third referral, but she has not been seen yet.  Post-procedure evaluation     Procedure:          Anesthesia, Analgesia, Anxiolysis:  Type: Therapeutic Inter-Laminar Epidural Steroid Injection  #3  Region: Lumbar Level: L2-3 Level. Laterality: Midline         Anesthesia: Local (1-2% Lidocaine)  Anxiolysis: None  Sedation: None  Guidance: Fluoroscopy           Position: Prone with head of the table was raised to facilitate breathing.   Indications: 1. DDD (degenerative disc disease), lumbosacral   2. Chronic low back pain (1ry area of Pain) (Bilateral) (L>R) w/ sciatica (Bilateral)   3. Chronic lower extremity pain (2ry area of Pain) (Bilateral)   4. Chronic radicular pain of lower extremity (Bilateral)   5. Ligamentum flavum  hypertrophy (L1-2, L2-3, L3-4)   6. 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 Lumbar Grade 1 Anterolisthesis of L3/L4 and L4/L5   8. Lumbar lateral recess stenosis (Bilateral) (L2-3, L3-4, L4-5) (Severe: L3-4)   9. Lumbar spinal stenosis (5 mm Severe L3-4; 8 mm L4-5) w/ neurogenic claudication   10. Lumbosacral radiculopathy at L5 (Left)   11. Morbid obesity with BMI of 40.0-44.9, adult ([place])   12. Chronic anticoagulation (Plaquenil)    Pain Score: Pre-procedure: 6 /10 Post-procedure: 0-No pain/10    Effectiveness:  Initial hour after procedure: 95 %. Subsequent 4-6 hours post-procedure: 95 %. Analgesia past initial 6 hours: 95 %. Ongoing improvement:  Analgesic: The patient is currently enjoying an 85% relief of her pain.  However, she still having left lower extremity numbness and weakness with an ongoing foot drop. Function: Laura [name] reports improvement in function ROM: Laura [name] reports improvement in ROM  Pharmacotherapy Assessment   Opioid Analgesic: Oxycodone IR 5 mg every 6 hours (20 mg/day) MME/day: 30 mg/day.   Monitoring: [place] PMP: PDMP reviewed during this encounter.       Pharmacotherapy: No side-effects or adverse reactions reported. Compliance: No problems identified. Effectiveness: Clinically acceptable. Plan: Refer to "POC". UDS:  Summary  Date Value Ref Range Status  07/09/2020 Note  Final    Comment:    ==================================================================== Tox[employer] Select 13 (MW) ==================================================================== Test                             Result       Flag       Units  Drug Absent but Declared for Prescription Verification   Oxycodone  Not Detected UNEXPECTED ng/mg creat ==================================================================== Test                      Result    Flag   Units      Ref Range   Creatinine              67               mg/dL       >=20 ==================================================================== Declared Medications:  The flagging and interpretation on this report are based on the  following declared medications.  Unexpected results may arise from  inaccuracies in the declared medications.   **Note: The testing scope of this panel includes these medications:   Oxycodone   **Note: The testing scope of this panel does not include the  following reported medications:   Albuterol  Aspirin  Azelastine  Chlorthalidone  Cholecalciferol  Duloxetine (Cymbalta)  Empagliflozin (Jardiance)  Hydroxychloroquine (Plaquenil)  Hydroxyzine  Infliximab (Remicade)  Levothyroxine  Loratadine (Claritin)  Methotrexate  Metoprolol  Multivitamin  Rosuvastatin  Spironolactone  Valacyclovir (Valtrex)  Zaleplon (Sonata) ==================================================================== For clinical consultation, please call [phone]. ====================================================================      Laboratory Chemistry Profile   Renal Lab Results  Component Value Date   BUN 24 (H) 11/14/2020   CREATININE 1.30 (H) 11/14/2020   GFRAA 57 (L) 10/03/2019   GFRNONAA 43 (L) 11/14/2020    Hepatic Lab Results  Component Value Date   AST 20 11/14/2020   ALT 15 11/14/2020   ALBUMIN 4.1 11/14/2020   ALKPHOS 43 11/14/2020    Electrolytes Lab Results  Component Value Date   NA 136 11/14/2020   K 3.6 11/14/2020   CL 100 11/14/2020   CALCIUM 9.1 11/14/2020   MG 2.3 10/08/2018    Bone Lab Results  Component Value Date   25OHVITD1 36 07/15/2015   25OHVITD2 3.6 07/15/2015   25OHVITD3 32 07/15/2015    Inflammation (CRP: Acute Phase) (ESR: Chronic Phase) Lab Results  Component Value Date   CRP 1.9 (H) 07/15/2015   ESRSEDRATE 32 (H) 07/15/2015   LATICACIDVEN 1.4 10/07/2018         Note: Above Lab results reviewed.  Imaging  DG PAIN CLINIC C-ARM 1-60 MIN NO REPORT Fluoro was used, but  no Radiologist interpretation will be provided.  Please refer to "NOTES" tab for provider progress note.  Assessment  The primary encounter diagnosis was Chronic low back pain (1ry area of Pain) (Bilateral) (L>R) w/ sciatica (Bilateral). Diagnoses of Chronic lower extremity pain (2ry area of Pain) (Bilateral), DDD (degenerative disc disease), lumbosacral, Chronic radicular pain of lower extremity (Bilateral), Ligamentum flavum hypertrophy (L1-2, L2-3, L3-4), Lumbar foraminal stenosis (Bilateral L3-4 and L5-S1), Lumbar Grade 1 Anterolisthesis of L3/L4 and L4/L5, Lumbar lateral recess stenosis (Bilateral) (L2-3, L3-4, L4-5) (Severe: L3-4), and Lumbar spinal stenosis (5 mm Severe L3-4; 8 mm L4-5) w/ neurogenic claudication were also pertinent to this visit.  Plan of Care  Problem-specific:  No problem-specific Assessment & Plan notes found for this encounter.  Laura [name] has a current medication list which includes the following long-term medication(s): albuterol, azelastine, chlorthalidone, duloxetine, flovent diskus, infliximab, levothyroxine, levothyroxine, loratadine, metoprolol succinate, oxycodone, [START ON 01/09/2021] oxycodone, [START ON 02/08/2021] oxycodone, spironolactone, synthroid, and zaleplon.  Pharmacotherapy (Medications Ordered): No orders of the defined types were placed in this encounter.  Orders:  No orders of the defined types were placed in this encounter.  Follow-up plan:   Return in about 2 months (  around 03/10/2021) for Eval-day (M,W), (F2F), (MM).     Interventional Therapies  Risk   Complexity Considerations:   Estimated body mass index is 38.27 kg/m as calculated from the following:   Height as of 12/22/20: '5\' 5"'  (1.651 m).   Weight as of 12/22/20: 230 lb (104.3 kg). PLAQUENIL Anticoagulation (Stop: 11 days)   Planned   Pending:      Under consideration:   Diagnostic bilateral genicular NB  Diagnostic bilateral L3 and/or L5 TFESI   Completed:    Therapeutic right IA steroid knee injection x2 (10/15/2015)  Therapeutic right Hyalgan knee injection x1 (09/01/2016)  Therapeutic left lumbar facet block x1 (01/22/2015)  Therapeutic left IA hip injection x1 (03/17/2015)  Therapeutic left L4-5 LESI x1 (02/04/2020) (100/100/85/85)  Therapeutic midline L2-3 LESI x3 (12/22/2020) (95/95/95/95)    Therapeutic   Palliative (PRN) options:   Therapeutic right IA Hyalgan knee injections Palliative left IA hip joint injection  Diagnostic left lumbar facet MBB #2      Recent Visits Date Type Provider Dept  12/22/20 Procedure visit Milinda Pointer, MD [facility]  12/09/20 Office Visit Milinda Pointer, MD [facility]  Showing recent visits within past 90 days and meeting all other requirements Today's Visits Date Type Provider Dept  01/07/21 Office Visit Milinda Pointer, MD [facility]  Showing today's visits and meeting all other requirements Future Appointments No visits were found meeting these conditions. Showing future appointments within next 90 days and meeting all other requirements  I discussed the assessment and treatment plan with the patient. The patient was provided an opportunity to ask questions and all were answered. The patient agreed with the plan and demonstrated an understanding of the instructions.  Patient advised to call back or seek an in-person evaluation if the symptoms or condition worsens.  Duration of encounter: 15 minutes.  Note by: Gaspar Cola, MD Date: 01/07/2021; Time: 3:58 PM

## 2021-01-06 NOTE — Telephone Encounter (Signed)
LM for patient to call office for pere virtual appointment questions.  °

## 2021-01-07 ENCOUNTER — Other Ambulatory Visit: Payer: Self-pay

## 2021-01-07 ENCOUNTER — Ambulatory Visit: Payer: Medicare Other | Attending: Pain Medicine | Admitting: Pain Medicine

## 2021-01-07 DIAGNOSIS — M79604 Pain in right leg: Secondary | ICD-10-CM | POA: Diagnosis not present

## 2021-01-07 DIAGNOSIS — M48062 Spinal stenosis, lumbar region with neurogenic claudication: Secondary | ICD-10-CM

## 2021-01-07 DIAGNOSIS — M5442 Lumbago with sciatica, left side: Secondary | ICD-10-CM

## 2021-01-07 DIAGNOSIS — M48061 Spinal stenosis, lumbar region without neurogenic claudication: Secondary | ICD-10-CM

## 2021-01-07 DIAGNOSIS — M5137 Other intervertebral disc degeneration, lumbosacral region: Secondary | ICD-10-CM

## 2021-01-07 DIAGNOSIS — M5410 Radiculopathy, site unspecified: Secondary | ICD-10-CM

## 2021-01-07 DIAGNOSIS — M5441 Lumbago with sciatica, right side: Secondary | ICD-10-CM

## 2021-01-07 DIAGNOSIS — M4316 Spondylolisthesis, lumbar region: Secondary | ICD-10-CM

## 2021-01-07 DIAGNOSIS — G8929 Other chronic pain: Secondary | ICD-10-CM

## 2021-01-07 DIAGNOSIS — M79605 Pain in left leg: Secondary | ICD-10-CM

## 2021-01-07 DIAGNOSIS — M2428 Disorder of ligament, vertebrae: Secondary | ICD-10-CM

## 2021-01-14 ENCOUNTER — Emergency Department: Payer: Medicare Other

## 2021-01-14 ENCOUNTER — Telehealth (INDEPENDENT_AMBULATORY_CARE_PROVIDER_SITE_OTHER): Payer: Medicare Other | Admitting: Psychiatry

## 2021-01-14 ENCOUNTER — Other Ambulatory Visit: Payer: Self-pay

## 2021-01-14 ENCOUNTER — Emergency Department
Admission: EM | Admit: 2021-01-14 | Discharge: 2021-01-17 | Disposition: A | Discharge status: Home / Self Care | Payer: Medicare Other | Attending: Emergency Medicine | Admitting: Emergency Medicine

## 2021-01-14 DIAGNOSIS — K529 Noninfective gastroenteritis and colitis, unspecified: Secondary | ICD-10-CM | POA: Diagnosis not present

## 2021-01-14 DIAGNOSIS — R112 Nausea with vomiting, unspecified: Secondary | ICD-10-CM

## 2021-01-14 DIAGNOSIS — R197 Diarrhea, unspecified: Secondary | ICD-10-CM

## 2021-01-14 DIAGNOSIS — F3342 Major depressive disorder, recurrent, in full remission: Secondary | ICD-10-CM

## 2021-01-14 DIAGNOSIS — J45909 Unspecified asthma, uncomplicated: Secondary | ICD-10-CM | POA: Diagnosis not present

## 2021-01-14 DIAGNOSIS — N189 Chronic kidney disease, unspecified: Secondary | ICD-10-CM | POA: Diagnosis not present

## 2021-01-14 LAB — CBC
HCT: 46.1 % — ABNORMAL HIGH (ref 36.0–46.0)
Hemoglobin: 15.2 g/dL — ABNORMAL HIGH (ref 12.0–15.0)
MCH: 30.8 pg (ref 26.0–34.0)
MCHC: 33 g/dL (ref 30.0–36.0)
MCV: 93.3 fL (ref 80.0–100.0)
Platelets: 265 10*3/uL (ref 150–400)
RBC: 4.94 MIL/uL (ref 3.87–5.11)
RDW: 13.6 % (ref 11.5–15.5)
WBC: 9.5 10*3/uL (ref 4.0–10.5)
nRBC: 0 % (ref 0.0–0.2)

## 2021-01-14 LAB — COMPREHENSIVE METABOLIC PANEL
ALT: 11 U/L (ref 0–44)
AST: 20 U/L (ref 15–41)
Albumin: 4 g/dL (ref 3.5–5.0)
Alkaline Phosphatase: 55 U/L (ref 38–126)
Anion gap: 10 (ref 5–15)
BUN: 17 mg/dL (ref 8–23)
CO2: 28 mmol/L (ref 22–32)
Calcium: 9 mg/dL (ref 8.9–10.3)
Chloride: 100 mmol/L (ref 98–111)
Creatinine, Ser: 1.1 mg/dL — ABNORMAL HIGH (ref 0.44–1.00)
GFR, Estimated: 52 mL/min — ABNORMAL LOW (ref >60)
Glucose, Bld: 193 mg/dL — ABNORMAL HIGH (ref 70–99)
Potassium: 3.9 mmol/L (ref 3.5–5.1)
Sodium: 138 mmol/L (ref 135–145)
Total Bilirubin: 0.7 mg/dL (ref 0.3–1.2)
Total Protein: 7.6 g/dL (ref 6.5–8.1)

## 2021-01-14 LAB — LIPASE, BLOOD: Lipase: 22 U/L (ref 11–51)

## 2021-01-14 IMAGING — CT CT ABD-PELV W/ CM
2 of 5 series · 16 of 46 positions shown, 18 images · IV contrast (APPLIED)
Comparison: None.

CLINICAL DATA: Causing vomiting for 4 days

EXAM:
CT ABDOMEN AND PELVIS WITH CONTRAST
TECHNIQUE: Multidetector CT imaging of the abdomen and pelvis was performed
using the standard protocol following bolus administration of
intravenous contrast.
CONTRAST:  100mL OMNIPAQUE IOHEXOL 300 MG/ML  SOLN

[Series 2: routine abd/pel with · axial · 0.98mm/px · z∈[-490,-65]mm · 13 of 97 slices shown, 15 images]
[im 6/97  soft-tissue]
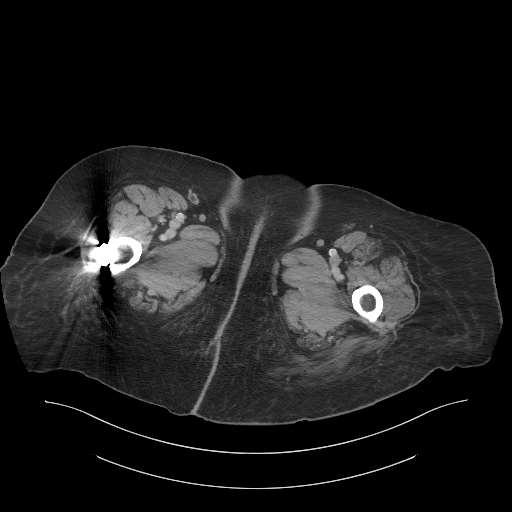
[im 6/97  bone]
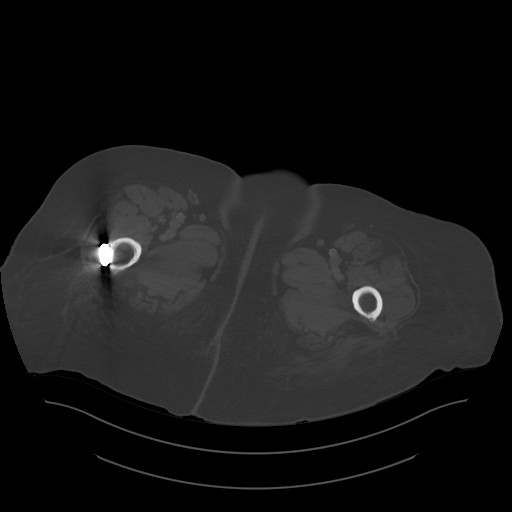
[im 11/97  soft-tissue]
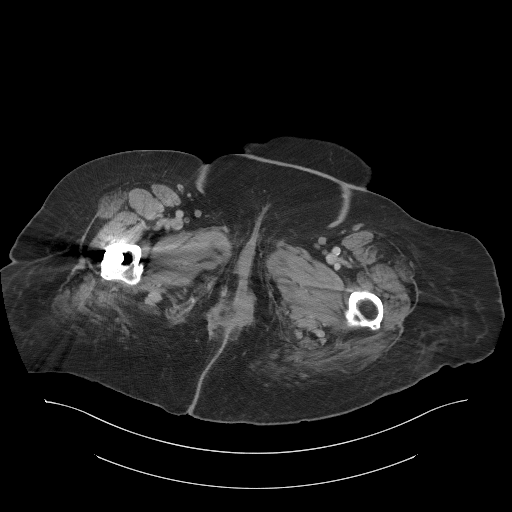
[im 22/97  soft-tissue]
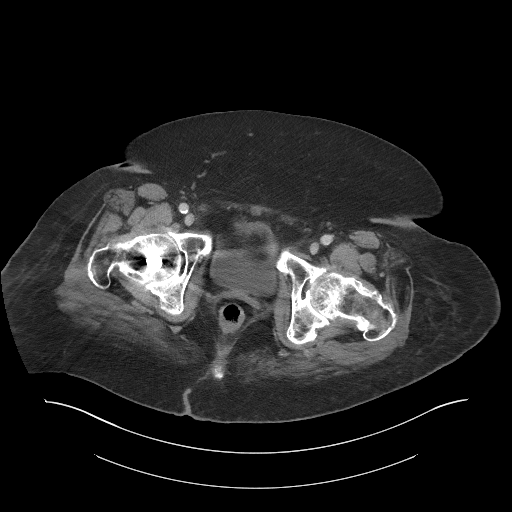
[im 27/97  soft-tissue]
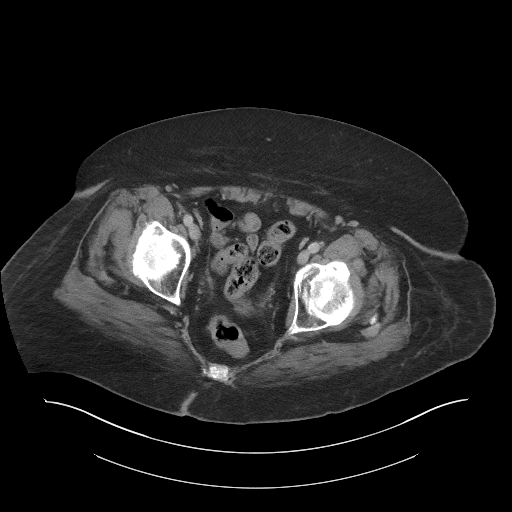
[im 33/97  soft-tissue]
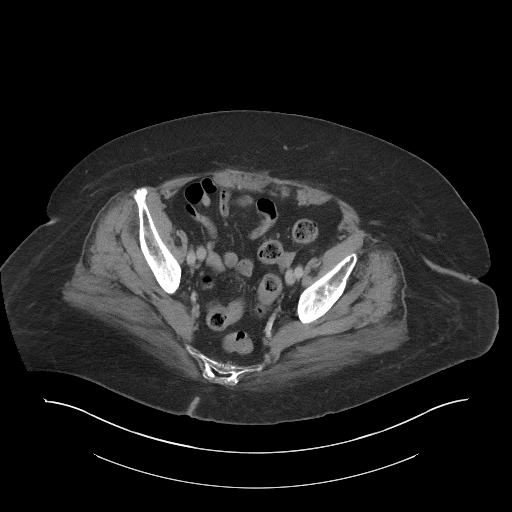
[im 43/97  soft-tissue]
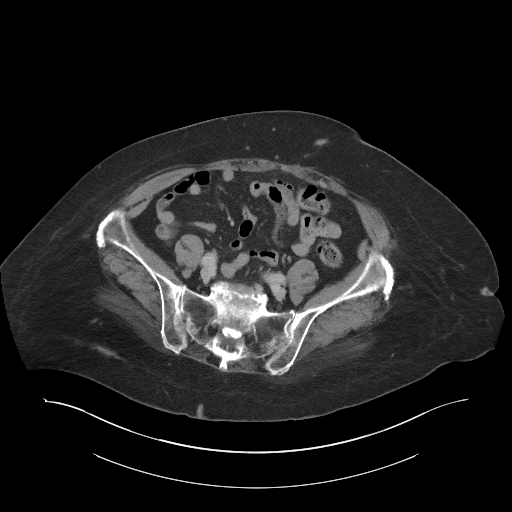
[im 49/97  soft-tissue]
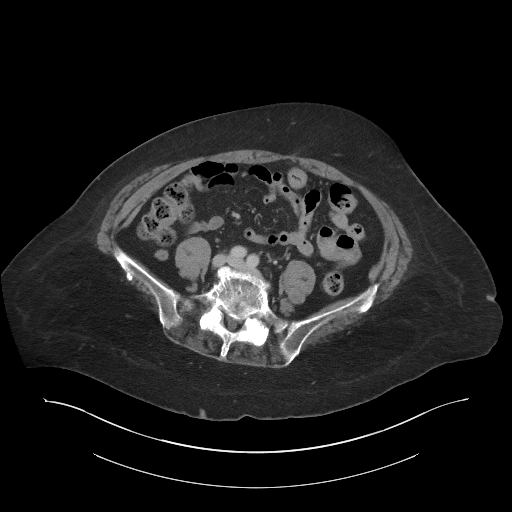
[im 54/97  soft-tissue]
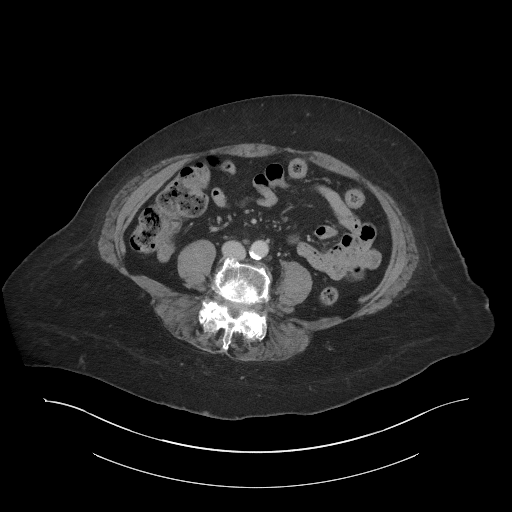
[im 65/97  soft-tissue]
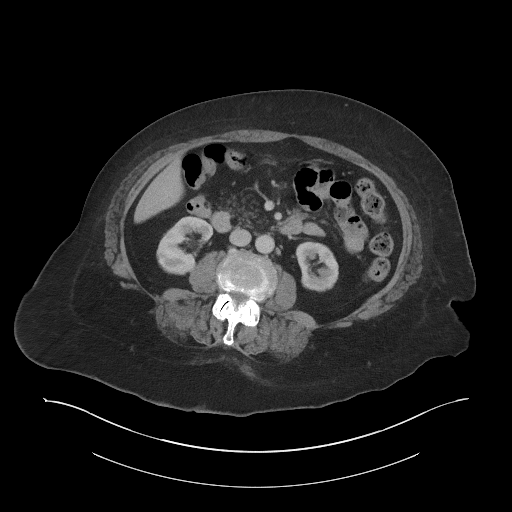
[im 65/97  bone]
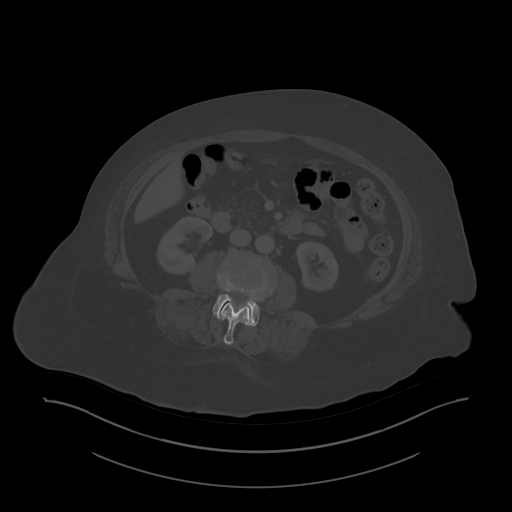
[im 70/97  soft-tissue]
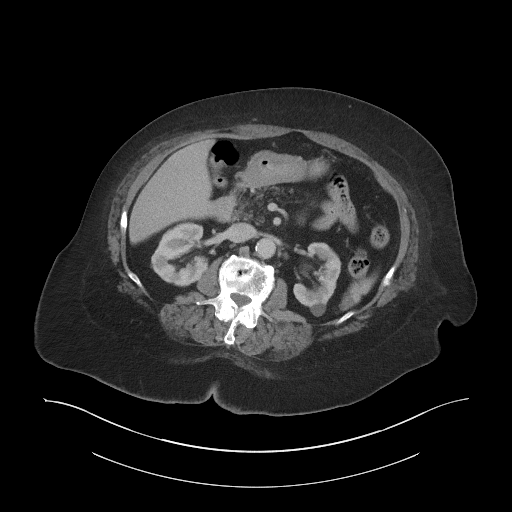
[im 75/97  soft-tissue]
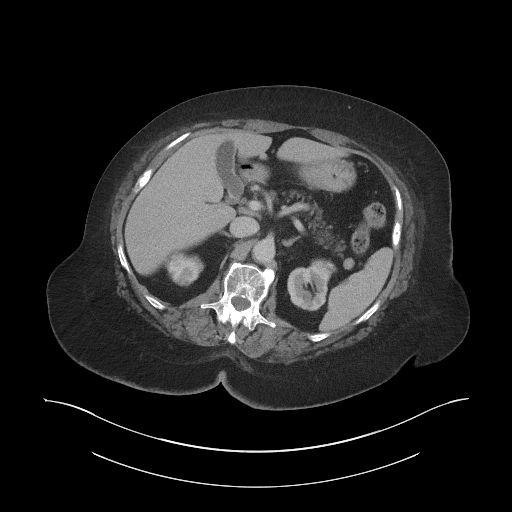
[im 86/97  soft-tissue]
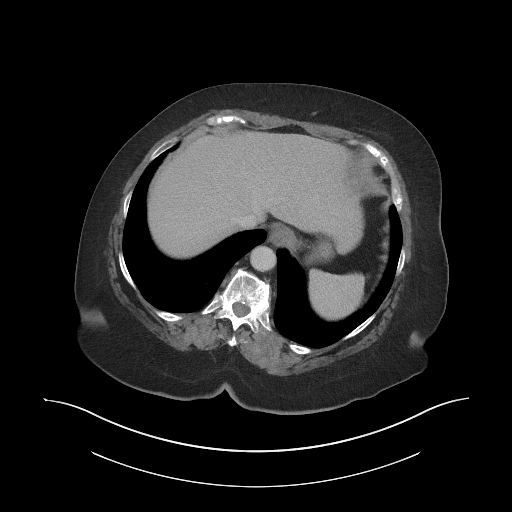
[im 91/97  soft-tissue]
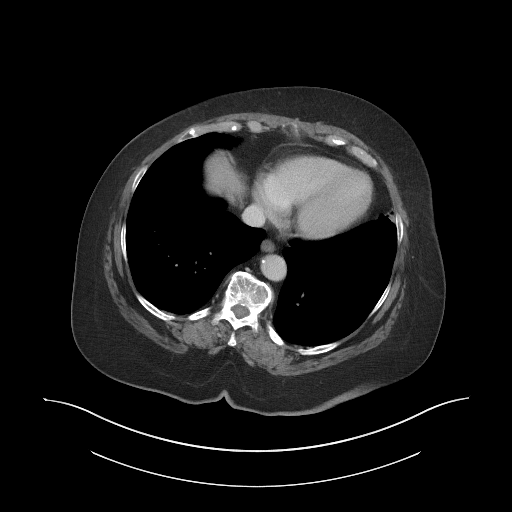

[Series 5: coronal (person_name) · coronal · 0.71mm/px · 3 of 104 slices shown]
[im 35/104  soft-tissue]
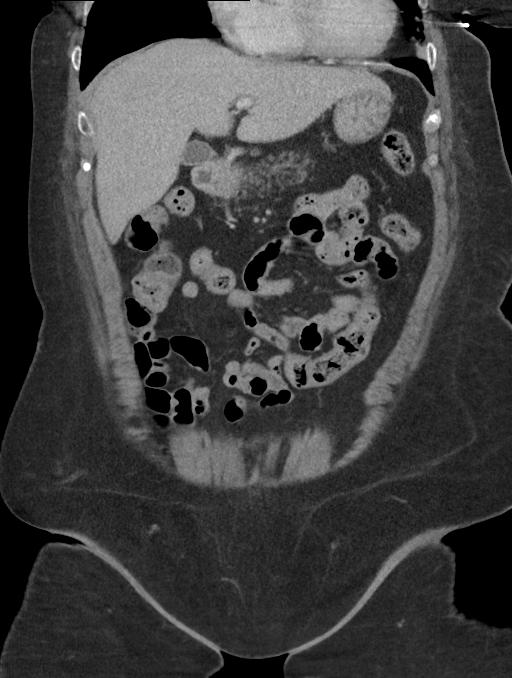
[im 46/104  soft-tissue]
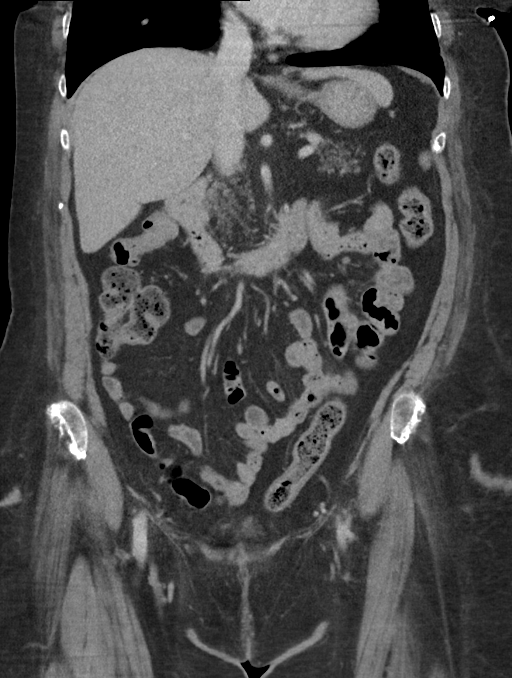
[im 58/104  soft-tissue]
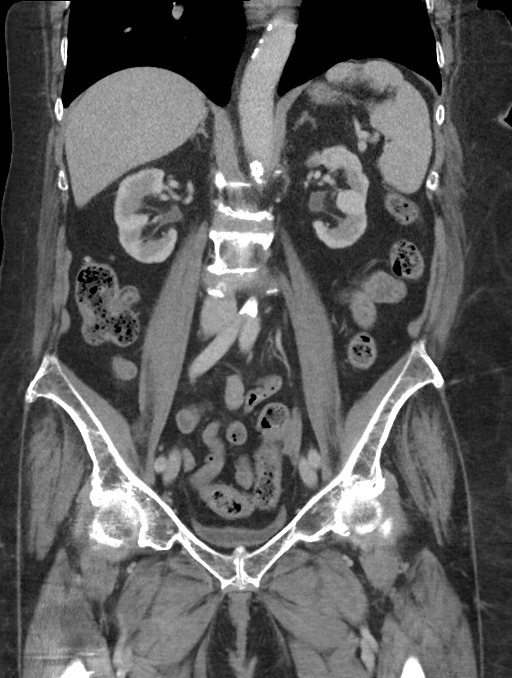

[16 of 46 positions shown; findings below may reference images not displayed]

FINDINGS: Lower chest: No acute pleural or parenchymal lung disease.

Hepatobiliary: No focal liver abnormality is seen. No gallstones,
gallbladder wall thickening, or biliary dilatation.

Pancreas: Fatty atrophy of the pancreas.  No focal abnormality.

Spleen: Normal in size without focal abnormality.

Adrenals/Urinary Tract: Small left renal cortical cyst. Otherwise
the kidneys enhance normally and symmetrically. No urinary tract
calculi or obstructive uropathy. Bladder is minimally distended with
no gross abnormalities. The adrenals are normal.

Stomach/Bowel: No bowel obstruction or ileus. Normal appendix within
the right upper quadrant. Mild wall thickening of the sigmoid colon
could reflect mild colitis.

Vascular/Lymphatic: Aortic atherosclerosis. No enlarged abdominal or
pelvic lymph nodes.

Reproductive: Status post hysterectomy. No adnexal masses.

Other: No free fluid or free intraperitoneal gas. No abdominal wall
hernia.

Musculoskeletal: No acute or destructive bony lesions. Postsurgical
changes right hip. Reconstructed images demonstrate no additional
findings.
IMPRESSION: 1. Mild wall thickening of the sigmoid colon consistent with
colitis.
2.  Aortic Atherosclerosis ([MC]-[MC]).

## 2021-01-14 MED ORDER — ONDANSETRON HCL 4 MG PO TABS: 4.0000 mg | ORAL_TABLET | Freq: Three times a day (TID) | ORAL | 0 refills | Status: DC | PRN

## 2021-01-14 MED ORDER — FAMOTIDINE 20 MG PO TABS: 20.0000 mg | ORAL_TABLET | Freq: Every day | ORAL | 1 refills | Status: DC

## 2021-01-14 MED ORDER — IOHEXOL 300 MG/ML  SOLN
100.0000 mL | Freq: Once | INTRAMUSCULAR | Status: AC | PRN
  Administered 2021-01-14: 100 mL via INTRAVENOUS
  Filled 2021-01-14: qty 100

## 2021-01-14 MED ORDER — SUCRALFATE 1 G PO TABS: 1.0000 g | ORAL_TABLET | Freq: Four times a day (QID) | ORAL | 0 refills | Status: DC

## 2021-01-14 MED ORDER — ACETAMINOPHEN 325 MG PO TABS
650.0000 mg | ORAL_TABLET | Freq: Once | ORAL | Status: AC
  Administered 2021-01-14: 650 mg via ORAL
  Filled 2021-01-14: qty 2

## 2021-01-14 NOTE — ED Provider Notes (Signed)
11:40 PM  Assumed care at shift change.  Patient here with abdominal pain, nausea and vomiting.  Labs reassuring.  CT of the abdomen pelvis shows possible mild sigmoid colitis.  Right upper quadrant ultrasound pending.  Previous provider anticipates discharge.  1:14 AM  Pt's right upper quadrant ultrasound is unremarkable.  Urine shows no sign of infection.  She is tolerating p.o. here and reports feeling better.  We did discuss the findings of mild sigmoid colitis seen on her CT scan.  Discussed with patient and daughter that this is normally self-limiting and could be caused by a bacterial versus viral infection.  She has no recent antibiotic use, hospitalization or healthcare exposures that would necessarily suggest C. difficile.  I did recommend if symptoms or not improving that she follow-up with her primary care doctor as they may need to send stool studies.  We discussed that if in 3 days from now her symptoms are worsening or not improving that she may begin antibiotics but to only take these if absolutely necessary.  Did recommend over-the-counter probiotics, Tylenol as needed.  Prescriptions for Pepcid, Carafate and Zofran were already sent to her pharmacy by the previous provider.  Discussed at length return precautions with patient and daughter.  They are comfortable with this plan.  Also given gastroenterology follow-up information.  Admission was considered given patient's age but given she is well-appearing, feeling better without bloody stools or melena, no further vomiting, afebrile and nontoxic, I feel outpatient management is very reasonable.   I reviewed all nursing notes and pertinent previous records as available.  I have reviewed and interpreted any EKGs, lab and urine results, imaging (as available).    Naidelyn Parrella, Delice Bison, DO 01/15/21 0116

## 2021-01-14 NOTE — Discharge Instructions (Addendum)
As we discussed please call the GI clinic to establish follow-up.  If you do notice any grossly bloody stool, black and tarry stools, persistent vomiting, high fever or significant abdominal pain, or any other new or concerning symptoms please return to the emergency department.    Your labs and urine today were reassuring.  Your CT scan shows mild inflammation of your sigmoid colon which is part of your large intestine this is consistent with colitis.  This can be caused by bacterial infection or a viral illness.  Usually this is self-limiting but if you notice in 3 days from now symptoms not improving or begin to worsen then you may start antibiotics.  If this occurs, I recommend close follow-up with your primary care doctor as they may need to send stool samples to identify the bacteria that is causing you to be ill.  I have sent in prescription for antibiotics.  Again please only take these antibiotics if symptoms are not improving or worsening in 3 days from now.     I recommend that you start taking over-the-counter probiotics as well.  You may use over-the-counter Tylenol 1000 mg every 6 hours as needed for fever and pain.  Prescriptions for acid reflux and nausea have been sent to the local [facility].

## 2021-01-14 NOTE — Progress Notes (Signed)
Patient is in the emergency department right now, called to cancel this appointment .  She will call back to reschedule.

## 2021-01-14 NOTE — ED Provider Notes (Signed)
[facility] Provider Note    Event Date/Time   First MD Initiated Contact with Patient 01/14/21 1717     (approximate)   History   Nausea   HPI  Melonia Haw is a 76 y.o. female  who, according to outpatient clinic note dated 12.29.22 has history of asthma, CKD and thyroid disease, presents to the emergency department because of concern for nausea and vomiting. The patient states that she has been nauseous frecently. 3 days ago the patient started having vomiting. Stated that she could not stop. Typically does not vomit. Then had more vomiting this morning. No significant associated abdominal pain but some cramping. Additionally the patient has noticed some darker stools. Has history of hysterectomy.       Physical Exam   Triage Vital Signs: ED Triage Vitals  Enc Vitals Group     BP 01/14/21 1218 136/83     Pulse Rate 01/14/21 1218 81     Resp 01/14/21 1218 14     Temp 01/14/21 1218 98.6 F (37 C)     Temp Source 01/14/21 1218 Oral     SpO2 01/14/21 1218 99 %     Weight --      Height 01/14/21 1221 5\' 5"  (1.651 m)     Head Circumference --      Peak Flow --      Pain Score 01/14/21 1220 4     Pain Loc --      Pain Edu? --      Excl. in [place]? --     Most recent vital signs: Vitals:   01/14/21 1805 01/14/21 2112  BP: 129/63 (!) 144/97  Pulse: 82 80  Resp: 18 18  Temp: 98.5 F (36.9 C) 98.3 F (36.8 C)  SpO2: 100% 98%    General: Awake, no distress.  CV:  Good peripheral perfusion.  Resp:  Normal effort.  Abd:  No distention. No tenderness   ED Results / Procedures / Treatments   Labs (all labs ordered are listed, but only abnormal results are displayed) Labs Reviewed  COMPREHENSIVE METABOLIC PANEL - Abnormal; Notable for the following components:      Result Value   Glucose, Bld 193 (*)    Creatinine, Ser 1.10 (*)    GFR, Estimated 52 (*)    All other components within normal limits  CBC - Abnormal; Notable for the  following components:   Hemoglobin 15.2 (*)    HCT 46.1 (*)    All other components within normal limits  LIPASE, BLOOD  URINALYSIS, ROUTINE W REFLEX MICROSCOPIC     EKG  None   RADIOLOGY CT abd/pel My interpretation: No free air.  Radiologist interpretation: IMPRESSION:  1. Mild wall thickening of the sigmoid colon consistent with  colitis.  2.  Aortic Atherosclerosis (ICD10-I70.0).        PROCEDURES:  Critical Care performed: No  Procedures   MEDICATIONS ORDERED IN ED: Medications  iohexol (OMNIPAQUE) 300 MG/ML solution 100 mL (100 mLs Intravenous Contrast Given 01/14/21 2142)  acetaminophen (TYLENOL) tablet 650 mg (650 mg Oral Given 01/14/21 2254)     IMPRESSION / MDM / ASSESSMENT AND PLAN / ED COURSE  I reviewed the triage vital signs and the nursing notes.                              Differential diagnosis includes, but is not limited to, gastritis, gastroenteritis, pancreatitis, hepatitis, gallbladder  disease.  Patient presented to the emergency department today because of concerns for vomiting.  On exam patient without any abdominal tenderness.  It is soft.  Blood work here without leukocytosis concerning for significant her abdominal infection.  Additionally CMP without any elevated liver tests and lipase was normal.  At this time I have low suspicion for hepatitis or pancreatitis.  Given this patient's description of the vomiting do have concern concern for possible obstruction.  She does have history of hysterectomy.  CT abdomen pelvis was obtained.  This did not show any obstruction.  Did show possible colitis.  I discussed this with the patient.  I am not sure this would necessarily explain the patient's vomiting.  Because of concerns for radiolucent gallstones will send for ultrasound.  If negative did discuss with patient possibility of treating for gastritis and GI follow-up.  FINAL CLINICAL IMPRESSION(S) / ED DIAGNOSES   Final diagnoses:  Nausea and  vomiting     Note:  This document was prepared using Dragon voice recognition software and may include unintentional dictation errors.    Nance Pear, MD 01/14/21 [phone]

## 2021-01-14 NOTE — ED Provider Triage Note (Signed)
Emergency Medicine Provider Triage Evaluation Note  Laura [name] , a 76 y.o. female  was evaluated in triage.  Pt complains of vomiting, abdominal pain.  Review of Systems  Positive: Nausea/vomiting with domino cramping and pain since Monday Negative: Fever, chills, chest pain, shortness of  Physical Exam  BP 136/83 (BP Location: Left Arm)    Pulse 81    Temp 98.6 F (37 C) (Oral)    Resp 14    SpO2 99%  Gen:   Awake, no distress   Resp:  Normal effort  MSK:   Moves extremities without difficulty  Other:  Abdomen is mildly tender in all 4 quadrants  Medical Decision Making  Medically screening exam initiated at 12:20 PM.  Appropriate orders placed.  Laura [name] was informed that the remainder of the evaluation will be completed by another provider, this initial triage assessment does not replace that evaluation, and the importance of remaining in the ED until their evaluation is complete.     Laura Ghee, PA-C 01/14/21 1221

## 2021-01-14 NOTE — ED Triage Notes (Signed)
Pt to ER via POV with complaints of nausea and vomiting. Reports symptoms started on Monday. Also reports abdominal cramping. States she had a negative covid test.  Reports being out of her BP meds the week before the onset of symptoms.

## 2021-01-15 LAB — URINALYSIS, ROUTINE W REFLEX MICROSCOPIC
Bilirubin Urine: NEGATIVE
Glucose, UA: NEGATIVE mg/dL
Hgb urine dipstick: NEGATIVE
Ketones, ur: NEGATIVE mg/dL
Leukocytes,Ua: NEGATIVE
Nitrite: NEGATIVE
Protein, ur: NEGATIVE mg/dL
Specific Gravity, Urine: >1.046 — ABNORMAL HIGH (ref 1.005–1.030)
pH: 5 (ref 5.0–8.0)

## 2021-01-15 MED ORDER — CIPROFLOXACIN HCL 500 MG PO TABS: 500.0000 mg | ORAL_TABLET | Freq: Two times a day (BID) | ORAL | 0 refills | Status: AC

## 2021-01-15 MED ORDER — PROBIOTIC 1-250 BILLION-MG PO CAPS: 1.0000 | ORAL_CAPSULE | Freq: Two times a day (BID) | ORAL | 0 refills | Status: DC

## 2021-01-15 MED ORDER — METRONIDAZOLE 500 MG PO TABS: 500.0000 mg | ORAL_TABLET | Freq: Three times a day (TID) | ORAL | 0 refills | Status: AC

## 2021-01-17 NOTE — ED Notes (Signed)
Pt d/c on 1/8 and never removed from board

## 2021-02-15 ENCOUNTER — Emergency Department
Admission: EM | Admit: 2021-02-15 | Discharge: 2021-02-15 | Disposition: A | Discharge status: Home / Self Care | Payer: Medicare Other | Attending: Emergency Medicine | Admitting: Emergency Medicine

## 2021-02-15 ENCOUNTER — Other Ambulatory Visit: Payer: Self-pay

## 2021-02-15 ENCOUNTER — Ambulatory Visit: Payer: Medicare Other | Admitting: Nurse Practitioner

## 2021-02-15 ENCOUNTER — Emergency Department: Payer: Medicare Other

## 2021-02-15 DIAGNOSIS — R2243 Localized swelling, mass and lump, lower limb, bilateral: Secondary | ICD-10-CM | POA: Insufficient documentation

## 2021-02-15 DIAGNOSIS — M5489 Other dorsalgia: Secondary | ICD-10-CM | POA: Diagnosis not present

## 2021-02-15 DIAGNOSIS — M4804 Spinal stenosis, thoracic region: Secondary | ICD-10-CM | POA: Diagnosis not present

## 2021-02-15 DIAGNOSIS — I129 Hypertensive chronic kidney disease with stage 1 through stage 4 chronic kidney disease, or unspecified chronic kidney disease: Secondary | ICD-10-CM | POA: Diagnosis not present

## 2021-02-15 DIAGNOSIS — N189 Chronic kidney disease, unspecified: Secondary | ICD-10-CM | POA: Diagnosis not present

## 2021-02-15 DIAGNOSIS — R600 Localized edema: Secondary | ICD-10-CM | POA: Diagnosis present

## 2021-02-15 DIAGNOSIS — R609 Edema, unspecified: Secondary | ICD-10-CM

## 2021-02-15 DIAGNOSIS — Z96659 Presence of unspecified artificial knee joint: Secondary | ICD-10-CM | POA: Diagnosis not present

## 2021-02-15 DIAGNOSIS — J45909 Unspecified asthma, uncomplicated: Secondary | ICD-10-CM | POA: Diagnosis not present

## 2021-02-15 LAB — CBC
HCT: 39 % (ref 36.0–46.0)
Hemoglobin: 12.7 g/dL (ref 12.0–15.0)
MCH: 30.2 pg (ref 26.0–34.0)
MCHC: 32.6 g/dL (ref 30.0–36.0)
MCV: 92.9 fL (ref 80.0–100.0)
Platelets: 254 10*3/uL (ref 150–400)
RBC: 4.2 MIL/uL (ref 3.87–5.11)
RDW: 13.3 % (ref 11.5–15.5)
WBC: 9.8 10*3/uL (ref 4.0–10.5)
nRBC: 0 % (ref 0.0–0.2)

## 2021-02-15 LAB — BASIC METABOLIC PANEL
Anion gap: 8 (ref 5–15)
BUN: 34 mg/dL — ABNORMAL HIGH (ref 8–23)
CO2: 29 mmol/L (ref 22–32)
Calcium: 9.4 mg/dL (ref 8.9–10.3)
Chloride: 95 mmol/L — ABNORMAL LOW (ref 98–111)
Creatinine, Ser: 1.37 mg/dL — ABNORMAL HIGH (ref 0.44–1.00)
GFR, Estimated: 40 mL/min — ABNORMAL LOW (ref >60)
Glucose, Bld: 149 mg/dL — ABNORMAL HIGH (ref 70–99)
Potassium: 4.2 mmol/L (ref 3.5–5.1)
Sodium: 132 mmol/L — ABNORMAL LOW (ref 135–145)

## 2021-02-15 LAB — BRAIN NATRIURETIC PEPTIDE: B Natriuretic Peptide: 145.5 pg/mL — ABNORMAL HIGH (ref 0.0–100.0)

## 2021-02-15 NOTE — ED Notes (Signed)
[facility]  CALLED  FOR  TRANSPORT  HOME 

## 2021-02-15 NOTE — ED Notes (Signed)
Dr. Sidney AceMchugh spoke with daughter re: dc 30 mins ago. Pt's daughter came out again asking for MD. She said she noticed that pt's feet are more swollen. Pointing at the top portion of feet appears more swollen than normal. MD notified.

## 2021-02-15 NOTE — Discharge Instructions (Signed)
Your ultrasound did not show any blood clot.  Your blood work was overall reassuring.  Please follow-up with your primary care provider as you may need to have a repeat echocardiogram.  Please apply compression stockings and try to keep your legs elevated to help with the swelling.

## 2021-02-15 NOTE — ED Notes (Signed)
US tech at pt bedside.

## 2021-02-15 NOTE — ED Triage Notes (Signed)
Pt arrives with [facility] from home c/o bilateral leg swelling. Pt reports left leg numbness that has "been going on for 1 year." Pt has hx of HTN, RA, anxiety, and spinal stenosis.

## 2021-02-15 NOTE — ED Provider Notes (Signed)
Patient signed out to me at 7 AM pending labs and a duplex ultrasound of the bilateral lower extremities.  76 year old presenting with 1 year of lower extremity swelling that is symmetric.  Her DVT study does not show any blood clot although calf veins unable to be visualized due to the subcutaneous edema.  Labs overall reassuring.  CBC within normal limits, creatinine near her baseline and BNP only mildly elevated.  She is appropriate for discharge.  Recommended compression and elevation.  Follow-up with PCP.   Georga HackingMcHugh, Pailynn Vahey Rose, MD 02/15/21 [phone]

## 2021-02-15 NOTE — ED Provider Notes (Signed)
[facility] Provider Note    Event Date/Time   First MD Initiated Contact with Patient 02/15/21 [phone]     (approximate)   History   Leg Swelling   HPI  Laura [name] is a 76 y.o. female  with a history of spinal stenosis, CKD, seizure, anxiety who presents from home for leg swelling.  Patient is complaining of bilateral leg swelling has been there for about a year.  She is not sure if it is getting worse.  She reports that her daughter who is a physical therapist was concerned that she may have a blood clot and decided to send her to the ER for further evaluation.  She has left-sided left numbness since a fall that she sustained a year ago.  She denies any changes in that numbness.  She has chronic spinal stenosis and chronic back pain.  Denies any changes in her back pain, saddle anesthesia. She does report decrease mobility for a year because of her swollen legs. She she tells me that her bed is really high up from the floor and she has difficulty coming in and out of the bed because of the swelling in her legs and because of pain from rheumatoid arthritis.  She reports that all her symptoms are chronic for about a year and denies any acuity.  She is here today because her daughter was concerned for a DVT.  She has never had a DVT before.  She has no chest pain or shortness of breath.     Past Medical History:  Diagnosis Date   Allergy    Anxiety    Arthritis, degenerative 10/08/2013   Overview:    a.  Lumbar spine/spinal stenosis/foot drop.   b.  Hands.    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   Memory loss, short term 03/19/2014   Sciatica of left side 12/31/2019   Seizure (HCC) 10/07/2014   Sleep apnea    Sleep apnea    Thyroid disease     Past Surgical History:   Procedure Laterality Date   ABDOMINAL HYSTERECTOMY     CESAREAN SECTION     HIP SURGERY Right    REPLACEMENT TOTAL KNEE Left      Physical Exam   Triage Vital Signs: ED Triage Vitals  Enc Vitals Group     BP 02/15/21 0558 140/75     Pulse Rate 02/15/21 0558 78     Resp 02/15/21 0558 15     Temp 02/15/21 0558 (!) 97.4 F (36.3 C)     Temp Source 02/15/21 0558 Oral     SpO2 02/15/21 0558 99 %     Weight --      Height --      Head Circumference --      Peak Flow --      Pain Score 02/15/21 0544 7     Pain Loc --      Pain Edu? --      Excl. in GC? --     Most recent vital signs: Vitals:   02/15/21 1018 02/15/21 1020  BP: 138/65   Pulse: 74 71  Resp: 18   Temp:    SpO2: 100% 100%     Constitutional: Alert and oriented. Well appearing and in no apparent distress. HEENT:  Head: Normocephalic and atraumatic.         Eyes: Conjunctivae are normal. Sclera is non-icteric.       Mouth/Throat: Mucous membranes are moist.       Neck: Supple with no signs of meningismus. Cardiovascular: Regular rate and rhythm. No murmurs, gallops, or rubs. 2+ symmetrical distal pulses are present in all extremities.  Respiratory: Normal respiratory effort. Lungs are clear to auscultation bilaterally.  Gastrointestinal: Soft, non tender, and non distended with positive bowel sounds. No rebound or guarding. Genitourinary: No CVA tenderness. Musculoskeletal:  symmetric b/l LE swelling  Neurologic: Normal speech and language. Face is symmetric. Moving all extremities. No gross focal neurologic deficits are appreciated. Skin: Skin is warm, dry and intact. No rash noted. Psychiatric: Mood and affect are normal. Speech and behavior are normal.  ED Results / Procedures / Treatments   Labs (all labs ordered are listed, but only abnormal results are displayed) Labs Reviewed  BASIC METABOLIC PANEL - Abnormal; Notable for the following components:      Result Value   Sodium 132 (*)     Chloride 95 (*)    Glucose, Bld 149 (*)    BUN 34 (*)    Creatinine, Ser 1.37 (*)    GFR, Estimated 40 (*)    All other components within normal limits  BRAIN NATRIURETIC PEPTIDE - Abnormal; Notable for the following components:   B Natriuretic Peptide 145.5 (*)    All other components within normal limits  CBC     EKG  ED ECG REPORT I, Nita Sickle, the attending physician, personally viewed and interpreted this ECG.  Normal sinus rhythm with a rate of 85, normal intervals, normal axis, no ST elevations or depressions.   RADIOLOGY I, Nita Sickle, attending MD, have personally viewed and interpreted the images obtained during this visit as below:  [place] negative for DVT   ___________________________________________________ Interpretation by Radiologist:  US Venous Img Lower Bilateral  Result Date: 02/15/2021 CLINICAL DATA:  76 year old female with bilateral lower extremity swelling. EXAM: BILATERAL LOWER EXTREMITY VENOUS DOPPLER ULTRASOUND TECHNIQUE: Gray-scale sonography with compression, as well as color and duplex ultrasound, were performed to evaluate the deep venous system(s) from the level of the common femoral vein through the popliteal and proximal calf veins. COMPARISON:  Left lower extremity Doppler 10/03/2019. FINDINGS: VENOUS Normal compressibility of the bilateral common femoral, superficial femoral, and popliteal veins. Visualized portions of bilateral profunda femoral vein and great saphenous vein unremarkable. No filling defects to suggest DVT on grayscale or color Doppler imaging. Doppler waveforms show normal direction of venous flow, normal respiratory plasticity and response to augmentation. Calf veins could not be identified in either lower extremity. Subcutaneous edema noted from the popliteal fossa distally, greater on the right. OTHER None. Limitations: none IMPRESSION: Bilateral calf veins could not be identified, probably due to extensive subcutaneous  edema. Otherwise no evidence of bilateral lower extremity deep venous thrombosis. Electronically Signed   By: Odessa Fleming M.D.   On: 02/15/2021 06:52      PROCEDURES:  Critical Care performed: No  Procedures    IMPRESSION / MDM / ASSESSMENT AND PLAN / ED COURSE  I reviewed the triage vital signs and the nursing notes.  76 y.o. female  with a history of spinal stenosis, CKD, seizure, anxiety who presents from home for b/l leg swelling x 1 year. Decreased mobility due to swelling and chronic spinal stenosis. No CP or SOB.  On exam patient has bilateral lower extremity swelling with  pitting edema that is symmetric.  She is able to lift both her legs from the bed equally.  Ddx: Due to decreased mobility patient could have a blood clot although this is most likely venous stasis in the setting of chronic symptoms lasting for about a year.  No signs of ischemia.  No signs of cellulitis.     Plan: We will get Doppler studies, basic lab work   MEDICATIONS GIVEN IN ED: Medications - No data to display   ED COURSE: Venous Doppler negative for DVT.  Labs are pending.  If normal kidney function will consider starting patient on Lasix as needed for leg swelling.  Also discussed Ace wrapping to help minimize swelling and protect from skin breakage.  Care transferred to Dr. Sidney Ace at [facility]   Consults: none   EMR reviewed including last visit with patient's PCP for hypertension and A-fib from 3 weeks ago    FINAL CLINICAL IMPRESSION(S) / ED DIAGNOSES   Bilateral leg swelling   Rx / DC Orders   ED Discharge Orders     None        Note:  This document was prepared using Dragon voice recognition software and may include unintentional dictation errors.   Please note:  Patient was evaluated in Emergency Department today for the symptoms described in the history of present illness. Patient was evaluated in the context of the global COVID-19 pandemic, which necessitated consideration that  the patient might be at risk for infection with the SARS-CoV-2 virus that causes COVID-19. Institutional protocols and algorithms that pertain to the evaluation of patients at risk for COVID-19 are in a state of rapid change based on information released by regulatory bodies including the CDC and federal and state organizations. These policies and algorithms were followed during the patient's care in the ED.  Some ED evaluations and interventions may be delayed as a result of limited staffing during the pandemic.       Don Perking, [place], MD 02/16/21 [phone]

## 2021-03-09 ENCOUNTER — Encounter (INDEPENDENT_AMBULATORY_CARE_PROVIDER_SITE_OTHER): Payer: Medicare Other | Admitting: Nurse Practitioner

## 2021-03-09 ENCOUNTER — Ambulatory Visit: Payer: Medicare Other | Attending: Pain Medicine | Admitting: Pain Medicine

## 2021-03-09 ENCOUNTER — Other Ambulatory Visit: Payer: Self-pay

## 2021-03-09 ENCOUNTER — Telehealth: Payer: Self-pay | Admitting: Student in an Organized Health Care Education/Training Program

## 2021-03-09 ENCOUNTER — Encounter: Payer: Self-pay | Admitting: Pain Medicine

## 2021-03-09 VITALS — BP 127/83 | Temp 97.9°F | Resp 20 | Ht 66.0 in | Wt 200.0 lb

## 2021-03-09 DIAGNOSIS — M5441 Lumbago with sciatica, right side: Secondary | ICD-10-CM | POA: Diagnosis present

## 2021-03-09 DIAGNOSIS — M79605 Pain in left leg: Secondary | ICD-10-CM | POA: Insufficient documentation

## 2021-03-09 DIAGNOSIS — Z79899 Other long term (current) drug therapy: Secondary | ICD-10-CM | POA: Diagnosis present

## 2021-03-09 DIAGNOSIS — M25552 Pain in left hip: Secondary | ICD-10-CM | POA: Insufficient documentation

## 2021-03-09 DIAGNOSIS — M5137 Other intervertebral disc degeneration, lumbosacral region: Secondary | ICD-10-CM | POA: Insufficient documentation

## 2021-03-09 DIAGNOSIS — M51379 Other intervertebral disc degeneration, lumbosacral region without mention of lumbar back pain or lower extremity pain: Secondary | ICD-10-CM

## 2021-03-09 DIAGNOSIS — G894 Chronic pain syndrome: Secondary | ICD-10-CM | POA: Diagnosis present

## 2021-03-09 DIAGNOSIS — M79604 Pain in right leg: Secondary | ICD-10-CM | POA: Insufficient documentation

## 2021-03-09 DIAGNOSIS — G8929 Other chronic pain: Secondary | ICD-10-CM

## 2021-03-09 DIAGNOSIS — M797 Fibromyalgia: Secondary | ICD-10-CM

## 2021-03-09 DIAGNOSIS — M25561 Pain in right knee: Secondary | ICD-10-CM | POA: Diagnosis present

## 2021-03-09 DIAGNOSIS — M5442 Lumbago with sciatica, left side: Secondary | ICD-10-CM | POA: Insufficient documentation

## 2021-03-09 DIAGNOSIS — Z79891 Long term (current) use of opiate analgesic: Secondary | ICD-10-CM

## 2021-03-09 DIAGNOSIS — M159 Polyosteoarthritis, unspecified: Secondary | ICD-10-CM

## 2021-03-09 MED ORDER — OXYCODONE HCL 5 MG PO TABS: 5.0000 mg | ORAL_TABLET | Freq: Four times a day (QID) | ORAL | 0 refills | Status: DC | PRN

## 2021-03-09 NOTE — Patient Instructions (Signed)

## 2021-03-09 NOTE — Progress Notes (Signed)
PROVIDER NOTE: Information contained herein reflects review and annotations entered in association with encounter. Interpretation of such information and data should be left to medically-trained personnel. Information provided to patient can be located elsewhere in the medical record under "Patient Instructions". Document created using STT-dictation technology, any transcriptional errors that may result from process are unintentional.    Patient: Laura [name]  Service Category: E/M  Provider: Gaspar Cola, MD  DOB: Apr 02, 1945  DOS: 03/09/2021  Specialty: Interventional Pain Management  MRN: [id number]  Setting: Ambulatory outpatient  PCP: Adin Hector, MD  Type: Established Patient    Referring Provider: Adin Hector, MD  Location: Office  Delivery: Face-to-face     HPI  Laura [name], a 76 y.o. year old female, is here today because of her Chronic pain syndrome [G89.4]. Laura [name] primary complain today is Back Pain (lower) Last encounter: My last encounter with her was on 01/07/2021. 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); Lumbar foraminal stenosis (Bilateral L3-4 and L5-S1); Chronic hip pain (Left); Osteoarthritis of knee (Right); Arthritis; Fibromyalgia syndrome; Chronic arthralgias of knees and hips (Right); Chronic hand pain, left; Dropfoot (Left); Weakness of foot (Left); Sciatica (Left); Weakness of leg (Left); Chronic lower extremity pain (2ry area of Pain) (Bilateral); Lumbosacral radiculopathy  at L5 (Left); Abnormal MRI, lumbar spine (12/26/2019); DDD (degenerative disc disease), lumbosacral; Statin myopathy; Foot drop, left; 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 Pain Assessment: Severity of Chronic pain is reported as a 5 /10. Location: Back Lower/pain in both legs, with numbness in left leg. Onset: More than a month ago. Quality: Aching. Timing: Constant. Modifying factor(s): medication. Vitals:  height is '5\' 6"'  (1.676 m) and weight is 200 lb (90.7 kg). Her temporal temperature is 97.9 F (36.6 C). Her blood pressure is 127/83. Her respiration is 20 and oxygen saturation is 92%.   Reason for encounter: medication management.   The patient indicates doing well with the current medication regimen. No adverse reactions or side effects reported to the medications.  Patient did not bring her pills today.  Apparently she has been having problems getting her medication filled at [facility].  She wants to switch to a different pharmacy.  She has not taking any pain medication in the past 3 days.  She has not seen the neurosurgeon yet.  RTCB: 04/09/2021  Pharmacotherapy Assessment  Analgesic: Oxycodone IR 5 mg every 6 hours (20 mg/day) MME/day: 30 mg/day.   Monitoring:  PMP: PDMP reviewed during this encounter.       Pharmacotherapy: No side-effects or adverse reactions reported. Compliance: No problems identified. Effectiveness: Clinically acceptable.  Landis Martins, RN  03/09/2021  3:22 PM  Sign when Signing Visit Nursing Pain Medication Assessment:  Safety precautions to be maintained throughout the outpatient stay will include: orient to  surroundings, keep bed in low position,  maintain call bell within reach at all times, provide assistance with transfer out of bed and ambulation.  Medication Inspection Compliance: Laura [name] did not comply with our request to bring her pills to be counted. She was reminded that bringing the medication bottles, even when empty, is a requirement.  Medication: None brought in. Pill/Patch Count: None available to be counted. Bottle Appearance: No container available. Did not bring bottle(s) to appointment. Filled Date: N/A Last Medication intake:  Ran out of medicine more than 48 hours ago    UDS:  Summary  Date Value Ref Range Status  07/09/2020 Note  Final    Comment:    ==================================================================== [employer] Select 13 (MW) ==================================================================== Test                             Result       Flag       Units  Drug Absent but Declared for Prescription Verification   Oxycodone                      Not Detected UNEXPECTED ng/mg creat ==================================================================== Test                      Result    Flag   Units      Ref Range   Creatinine              67               mg/dL      >=20 ==================================================================== Declared Medications:  The flagging and interpretation on this report are based on the  following declared medications.  Unexpected results may arise from  inaccuracies in the declared medications.   **Note: The testing scope of this panel includes these medications:   Oxycodone   **Note: The testing scope of this panel does not include the  following reported medications:   Albuterol  Aspirin  Azelastine  Chlorthalidone  Cholecalciferol  Duloxetine (Cymbalta)  Empagliflozin (Jardiance)  Hydroxychloroquine (Plaquenil)  Hydroxyzine  Infliximab (Remicade)  Levothyroxine  Loratadine (Claritin)   Methotrexate  Metoprolol  Multivitamin  Rosuvastatin  Spironolactone  Valacyclovir (Valtrex)  Zaleplon (Sonata) ==================================================================== For clinical consultation, please call [phone]. ====================================================================      ROS  Constitutional: Denies any fever or chills Gastrointestinal: No reported hemesis, hematochezia, vomiting, or acute GI distress Musculoskeletal: Denies any acute onset joint swelling, redness, loss of ROM, or weakness Neurological: No reported episodes of acute onset apraxia, aphasia, dysarthria, agnosia, amnesia, paralysis, loss of coordination, or loss of consciousness  Medication Review  DULoxetine, Fluticasone Propionate (Inhal), Multi-Vitamins, Vitamin D, albuterol, aspirin EC, azelastine, doxycycline, folic acid, furosemide, hydrOXYzine, hydroxychloroquine, inFLIXimab, levothyroxine, loratadine, methotrexate, metoprolol succinate, ondansetron, oxyCODONE, rosuvastatin, spironolactone, sucralfate, and traZODone  History Review  Allergy: Laura [name] is allergic to bupropion, jardiance [empagliflozin], and penicillins. Drug: Laura [name]  reports no history of drug use. Alcohol:  reports no history of alcohol use. Tobacco:  reports that she has never smoked. She has never used smokeless tobacco. Social: Laura [name]  reports that she has never smoked. She has never used smokeless tobacco. She reports that she does not drink alcohol and does not use drugs. Medical:  has a past medical history of Allergy, Anxiety, Arthritis, degenerative (10/08/2013), Asthma, Chronic hand pain, left (10/10/2019), Chronic kidney disease, Depression, Lumbar spinal stenosis with neurogenic claudication (11/07/2014), Major depression, single episode, in complete remission ([place]) (06/25/2015),  Memory loss, short term (03/19/2014), Sciatica of left side (12/31/2019), Seizure ([place]) (10/07/2014), Sleep apnea,  Sleep apnea, and Thyroid disease. Surgical: Laura [name]  has a past surgical history that includes Abdominal hysterectomy; Cesarean section; Replacement total knee (Left); and Hip surgery (Right). Family: family history includes Alcohol abuse in her father; Depression in her father and sister; Heart attack in her father; Hypertension in her mother and sister; Post-traumatic stress disorder in her father; Rheum arthritis in her sister; Stroke in her mother.  Laboratory Chemistry Profile   Renal Lab Results  Component Value Date   BUN 34 (H) 02/15/2021   CREATININE 1.37 (H) 02/15/2021   GFRAA 57 (L) 10/03/2019   GFRNONAA 40 (L) 02/15/2021    Hepatic Lab Results  Component Value Date   AST 20 01/14/2021   ALT 11 01/14/2021   ALBUMIN 4.0 01/14/2021   ALKPHOS 55 01/14/2021   LIPASE 22 01/14/2021    Electrolytes Lab Results  Component Value Date   NA 132 (L) 02/15/2021   K 4.2 02/15/2021   CL 95 (L) 02/15/2021   CALCIUM 9.4 02/15/2021   MG 2.3 10/08/2018    Bone Lab Results  Component Value Date   25OHVITD1 36 07/15/2015   25OHVITD2 3.6 07/15/2015   25OHVITD3 32 07/15/2015    Inflammation (CRP: Acute Phase) (ESR: Chronic Phase) Lab Results  Component Value Date   CRP 1.9 (H) 07/15/2015   ESRSEDRATE 32 (H) 07/15/2015   LATICACIDVEN 1.4 10/07/2018         Note: Above Lab results reviewed.  Recent Imaging Review  US Venous Img Lower Bilateral CLINICAL DATA:  76 year old female with bilateral lower extremity swelling.  EXAM: BILATERAL LOWER EXTREMITY VENOUS DOPPLER ULTRASOUND  TECHNIQUE: Gray-scale sonography with compression, as well as color and duplex ultrasound, were performed to evaluate the deep venous system(s) from the level of the common femoral vein through the popliteal and proximal calf veins.  COMPARISON:  Left lower extremity Doppler 10/03/2019.  FINDINGS: VENOUS  Normal compressibility of the bilateral common femoral, superficial femoral, and  popliteal veins. Visualized portions of bilateral profunda femoral vein and great saphenous vein unremarkable. No filling defects to suggest DVT on grayscale or color Doppler imaging. Doppler waveforms show normal direction of venous flow, normal respiratory plasticity and response to augmentation.  Calf veins could not be identified in either lower extremity. Subcutaneous edema noted from the popliteal fossa distally, greater on the right.  OTHER  None.  Limitations: none  IMPRESSION: Bilateral calf veins could not be identified, probably due to extensive subcutaneous edema. Otherwise no evidence of bilateral lower extremity deep venous thrombosis.  Electronically Signed   By: Genevie Ann M.D.   On: 02/15/2021 06:52 Note: Reviewed        Physical Exam  General appearance: Well nourished, well developed, and well hydrated. In no apparent acute distress Mental status: Alert, oriented x 3 (person, place, & time)       Respiratory: No evidence of acute respiratory distress Eyes: PERLA Vitals: BP 127/83    Temp 97.9 F (36.6 C) (Temporal)    Resp 20    Ht '5\' 6"'  (1.676 m)    Wt 200 lb (90.7 kg)    SpO2 92%    BMI 32.28 kg/m  BMI: Estimated body mass index is 32.28 kg/m as calculated from the following:   Height as of this encounter: '5\' 6"'  (1.676 m).   Weight as of this encounter: 200 lb (90.7 kg). Ideal: Ideal body weight: 59.3 kg (  130 lb 11.7 oz) Adjusted ideal body weight: 71.9 kg (158 lb 7 oz)  Assessment   Status Diagnosis  Controlled Controlled Controlled 1. Chronic pain syndrome   2. Chronic low back pain (1ry area of Pain) (Bilateral) (L>R) w/ sciatica (Bilateral)   3. DDD (degenerative disc disease), lumbosacral   4. Chronic lower extremity pain (2ry area of Pain) (Bilateral)   5. Chronic hip pain (Left)   6. Chronic knee pain (Right)   7. Generalized osteoarthritis of multiple sites   8. Fibromyalgia syndrome   9. Pharmacologic therapy   10. Chronic use of  opiate for therapeutic purpose   11. Encounter for medication management   12. Encounter for chronic pain management      Updated Problems: No problems updated.  Plan of Care  Problem-specific:  No problem-specific Assessment & Plan notes found for this encounter.  Laura [name] has a current medication list which includes the following long-term medication(s): albuterol, azelastine, duloxetine, flovent diskus, infliximab, levothyroxine, loratadine, metoprolol succinate, spironolactone, sucralfate, and oxycodone.  Pharmacotherapy (Medications Ordered): Meds ordered this encounter  Medications   DISCONTD: oxyCODONE (OXY IR/ROXICODONE) 5 MG immediate release tablet    Sig: Take 1 tablet (5 mg total) by mouth every 6 (six) hours as needed for severe pain. Must last 30 days    Dispense:  120 tablet    Refill:  0    DO NOT: delete (not duplicate); no partial-fill (will deny script to complete), no refill request (F/U required). DISPENSE: 1 day early if closed on fill date. WARN: No CNS-depressants within 8 hrs of med.   oxyCODONE (OXY IR/ROXICODONE) 5 MG immediate release tablet    Sig: Take 1 tablet (5 mg total) by mouth every 6 (six) hours as needed for severe pain. Must last 30 days    Dispense:  120 tablet    Refill:  0    DO NOT: delete (not duplicate); no partial-fill (will deny script to complete), no refill request (F/U required). DISPENSE: 1 day early if closed on fill date. WARN: No CNS-depressants within 8 hrs of med.   Orders:  No orders of the defined types were placed in this encounter.  Follow-up plan:   Return in about 31 days (around 04/09/2021) for Eval-day (M,W), (F2F), (MM).     Interventional Therapies  Risk   Complexity Considerations:   Estimated body mass index is 38.27 kg/m as calculated from the following:   Height as of 12/22/20: '5\' 5"'  (1.651 m).   Weight as of 12/22/20: 230 lb (104.3 kg). PLAQUENIL Anticoagulation (Stop: 11 days)   Planned    Pending:      Under consideration:   Diagnostic bilateral genicular NB  Diagnostic bilateral L3 and/or L5 TFESI   Completed:   Therapeutic right IA steroid knee injection x2 (10/15/2015)  Therapeutic right Hyalgan knee injection x1 (09/01/2016)  Therapeutic left lumbar facet block x1 (01/22/2015)  Therapeutic left IA hip injection x1 (03/17/2015)  Therapeutic left L4-5 LESI x1 (02/04/2020) (100/100/85/85)  Therapeutic midline L2-3 LESI x3 (12/22/2020) (95/95/95/95)    Therapeutic   Palliative (PRN) options:   Therapeutic right IA Hyalgan knee injections Palliative left IA hip joint injection  Diagnostic left lumbar facet MBB #2      Recent Visits Date Type Provider Dept  01/07/21 Office Visit Milinda Pointer, MD [facility]  12/22/20 Procedure visit Milinda Pointer, MD [facility]  12/09/20 Office Visit Milinda Pointer, MD [facility]  Showing recent visits within past 74  days and meeting all other requirements Today's Visits Date Type Provider Dept  03/09/21 Office Visit Milinda Pointer, MD [facility]  Showing today's visits and meeting all other requirements Future Appointments Date Type Provider Dept  04/07/21 Appointment Milinda Pointer, MD [facility]  Showing future appointments within next 90 days and meeting all other requirements  I discussed the assessment and treatment plan with the patient. The patient was provided an opportunity to ask questions and all were answered. The patient agreed with the plan and demonstrated an understanding of the instructions.  Patient advised to call back or seek an in-person evaluation if the symptoms or condition worsens.  Duration of encounter: 30 minutes.  Note by: Gaspar Cola, MD Date: 03/09/2021; Time: 3:34 PM

## 2021-03-09 NOTE — Telephone Encounter (Signed)
error 

## 2021-03-09 NOTE — Progress Notes (Signed)
Nursing Pain Medication Assessment:  Safety precautions to be maintained throughout the outpatient stay will include: orient to surroundings, keep bed in low position, maintain call bell within reach at all times, provide assistance with transfer out of bed and ambulation.  Medication Inspection Compliance: Laura [name] did not comply with our request to bring her pills to be counted. She was reminded that bringing the medication bottles, even when empty, is a requirement.  Medication: None brought in. Pill/Patch Count: None available to be counted. Bottle Appearance: No container available. Did not bring bottle(s) to appointment. Filled Date: N/A Last Medication intake:  Ran out of medicine more than 48 hours ago

## 2021-03-09 NOTE — Telephone Encounter (Signed)
[facility]ed [facility] that was sent today.  [facility] [address].

## 2021-03-11 ENCOUNTER — Other Ambulatory Visit: Payer: Self-pay

## 2021-03-11 ENCOUNTER — Encounter (INDEPENDENT_AMBULATORY_CARE_PROVIDER_SITE_OTHER): Payer: Self-pay | Admitting: Nurse Practitioner

## 2021-03-11 ENCOUNTER — Ambulatory Visit (INDEPENDENT_AMBULATORY_CARE_PROVIDER_SITE_OTHER): Payer: Medicare Other | Admitting: Nurse Practitioner

## 2021-03-11 VITALS — BP 96/69 | HR 80 | Resp 16 | Ht 66.0 in | Wt 230.0 lb

## 2021-03-11 DIAGNOSIS — I890 Lymphedema, not elsewhere classified: Secondary | ICD-10-CM

## 2021-03-12 ENCOUNTER — Telehealth: Payer: Self-pay | Admitting: Pain Medicine

## 2021-03-12 NOTE — Telephone Encounter (Addendum)
Patients daughter lvmal stating she is concerned about her Mom's pain meds. Mom does not want her daughter to help with med mgmt. Daughter wants to know if Mom has script for narcan. Would like someone to call her please.  ?[phone] ?

## 2021-03-12 NOTE — Telephone Encounter (Signed)
Spoke with daughter and she states that the patient is currently letting her help with the medication and everything is  under control at this time.  ?

## 2021-03-20 ENCOUNTER — Other Ambulatory Visit: Payer: Self-pay | Admitting: Psychiatry

## 2021-03-20 DIAGNOSIS — F3342 Major depressive disorder, recurrent, in full remission: Secondary | ICD-10-CM

## 2021-03-21 ENCOUNTER — Encounter (INDEPENDENT_AMBULATORY_CARE_PROVIDER_SITE_OTHER): Payer: Self-pay | Admitting: Nurse Practitioner

## 2021-03-21 NOTE — Progress Notes (Signed)
Subjective:    Patient ID: Laura [name], female    DOB: 1945-09-17, 76 y.o.   MRN: [id number] Chief Complaint  Patient presents with   Establish Care    Referred by  Ardeen Fillers edema/lymphedema    Laura [name] is a 76 year old female that presents today as a referral from Dr. Caryl Comes in regards to lower extremity edema.  The patient has a past medical history paroxysmal atrial fibrillation, chronic kidney disease stage III, rheumatoid arthritis as well as spinal stenosis.  She previously had DVT studies done which revealed no evidence of DVT bilaterally.  The patient has been taking Lasix but it has not been Especially helpful.  The patient does elevate her feet when able and this is somewhat helpful.  She notes that the swelling improves overnight but does not go away.  The patient also notes that her left foot is unremarkable for her spinal stenosis.  She does not ambulate very readily.   Review of Systems  Cardiovascular:  Positive for leg swelling.  Musculoskeletal:  Positive for arthralgias.  Neurological:  Positive for weakness.  All other systems reviewed and are negative.     Objective:   Physical Exam Vitals reviewed.  HENT:     Head: Normocephalic.  Cardiovascular:     Rate and Rhythm: Normal rate.  Pulmonary:     Effort: Pulmonary effort is normal.  Musculoskeletal:     Right lower leg: 2+ Edema present.     Left lower leg: 2+ Edema present.  Skin:    General: Skin is warm and dry.  Neurological:     Mental Status: She is alert and oriented to person, place, and time.  Psychiatric:        Mood and Affect: Mood normal.        Behavior: Behavior normal.        Thought Content: Thought content normal.        Judgment: Judgment normal.    BP 96/69 (BP Location: Right Arm)    Pulse 80    Resp 16    Ht 5\' 6"  (1.676 m)    Wt 230 lb (104.3 kg)    BMI 37.12 kg/m   Past Medical History:  Diagnosis Date   Allergy    Anxiety    Arthritis, degenerative  10/08/2013   Overview:    a.  Lumbar spine/spinal stenosis/foot drop.   b.  Hands.    Asthma    Chronic hand pain, left 10/10/2019   Chronic kidney disease    Depression    Lumbar spinal stenosis with neurogenic claudication 11/07/2014   Major depression, single episode, in complete remission (Laura [name]) 06/25/2015   Memory loss, short term 03/19/2014   Rheumatoid arthritis ([place])    Sciatica of left side 12/31/2019   Seizure ([place]) 10/07/2014   Sleep apnea    Sleep apnea    Thyroid disease     Social History   Socioeconomic History   Marital status: Married    Spouse name: Not on file   Number of children: Not on file   Years of education: Not on file   Highest education level: Not on file  Occupational History    Comment: retired  Tobacco Use   Smoking status: Never   Smokeless tobacco: Never  Vaping Use   Vaping Use: Never used  Substance and Sexual Activity   Alcohol use: No    Alcohol/week: 0.0 standard drinks   Drug use: No   Sexual activity: Not Currently  Other Topics Concern   Not on file  Social History Narrative   Not on file   Social Determinants of Health   Financial Resource Strain: Not on file  Food Insecurity: Not on file  Transportation Needs: Not on file  Physical Activity: Not on file  Stress: Not on file  Social Connections: Not on file  Intimate Partner Violence: Not on file    Past Surgical History:  Procedure Laterality Date   ABDOMINAL HYSTERECTOMY     CESAREAN SECTION     HIP SURGERY Right    REPLACEMENT TOTAL KNEE Left     Family History  Problem Relation Age of Onset   Hypertension Mother    Stroke Mother    Heart attack Father    Alcohol abuse Father    Depression Father    Post-traumatic stress disorder Father    Rheum arthritis Sister    Hypertension Sister    Depression Sister    Breast cancer Neg Hx     Allergies  Allergen Reactions   Bupropion    Jardiance [Empagliflozin] Other (See Comments)   Penicillins Rash     CBC Latest Ref Rng & Units 02/15/2021 01/14/2021 11/14/2020  WBC 4.0 - 10.5 K/uL 9.8 9.5 9.0  Hemoglobin 12.0 - 15.0 g/dL 12.7 15.2(H) 15.2(H)  Hematocrit 36.0 - 46.0 % 39.0 46.1(H) 45.5  Platelets 150 - 400 K/uL 254 265 273      CMP     Component Value Date/Time   NA 132 (L) 02/15/2021 0708   NA 138 06/12/2013 1508   K 4.2 02/15/2021 0708   K 4.1 06/12/2013 1508   CL 95 (L) 02/15/2021 0708   CL 102 06/12/2013 1508   CO2 29 02/15/2021 0708   CO2 32 06/12/2013 1508   GLUCOSE 149 (H) 02/15/2021 0708   GLUCOSE 109 (H) 06/12/2013 1508   BUN 34 (H) 02/15/2021 0708   BUN 20 (H) 06/12/2013 1508   CREATININE 1.37 (H) 02/15/2021 0708   CREATININE 1.42 (H) 06/12/2013 1508   CALCIUM 9.4 02/15/2021 0708   CALCIUM 8.6 06/12/2013 1508   PROT 7.6 01/14/2021 1220   ALBUMIN 4.0 01/14/2021 1220   AST 20 01/14/2021 1220   ALT 11 01/14/2021 1220   ALKPHOS 55 01/14/2021 1220   BILITOT 0.7 01/14/2021 1220   GFRNONAA 40 (L) 02/15/2021 0708   GFRNONAA 38 (L) 06/12/2013 1508   GFRAA 57 (L) 10/03/2019 1612   GFRAA 44 (L) 06/12/2013 1508     No results found.     Assessment & Plan:   1. Lymphedema I suspect that the patient's lower extremity lymphedema is largely resolved her immobility due to her spinal stenosis.  She also has chronic kidney disease as well as rheumatoid arthritis which can also be adding to her swelling.  However, it is possible there is some chronic venous insufficiency worsening her swelling.  We will have the patient return at her convenience for noninvasive studies.  Otherwise the patient is advised to engage in conservative therapy including use of medical grade compression stockings, elevation and activity as she is possible.  Pending the results of the ultrasound a lymphedema pump may also be a good therapeutic endeavor for her as well, we will evaluate his following her noninvasive studies.   Current Outpatient Medications on File Prior to Visit  Medication Sig  Dispense Refill   albuterol (VENTOLIN HFA) 108 (90 Base) MCG/ACT inhaler Inhale 2 puffs into the lungs every 6 (six) hours as needed for wheezing or  shortness of breath. 18 g 3   aspirin EC 81 MG tablet Take by mouth.     azelastine (ASTELIN) 0.1 % nasal spray Place into the nose.     Cholecalciferol (VITAMIN D) 2000 UNITS tablet Take by mouth daily.      doxycycline (MONODOX) 100 MG capsule Take 100 mg by mouth 2 (two) times daily.     DULoxetine (CYMBALTA) 60 MG capsule Take 1 capsule (60 mg total) by mouth daily. 90 capsule 1   FLOVENT DISKUS 100 MCG/BLIST AEPB SMARTSIG:1 Inhalation Via Inhaler Twice Daily     folic acid (FOLVITE) 1 MG tablet Take 1 mg by mouth daily.     furosemide (LASIX) 80 MG tablet Take 80 mg by mouth daily.     hydroxychloroquine (PLAQUENIL) 200 MG tablet Take 100 mg by mouth daily.      hydrOXYzine (VISTARIL) 25 MG capsule Take 3 capsules (75 mg total) by mouth at bedtime as needed. 90 capsule 0   inFLIXimab (REMICADE) 100 MG injection Inject 100 mg into the vein every 8 (eight) weeks.     levothyroxine (SYNTHROID) 50 MCG tablet Take 200 mcg by mouth at bedtime.     loratadine (CLARITIN) 10 MG tablet Take 10 mg by mouth daily.      methotrexate (RHEUMATREX) 2.5 MG tablet TAKE 4 TABLETS (10 MG TOTAL) BY MOUTH EVERY 7 (SEVEN) DAYS WITH A MEAL  5   metoprolol succinate (TOPROL-XL) 100 MG 24 hr tablet Take 1 tablet (100 mg total) by mouth daily. Take with or immediately following a meal. 30 tablet 0   Multiple Vitamin (MULTI-VITAMINS) TABS Take by mouth.     ondansetron (ZOFRAN) 4 MG tablet Take 1 tablet (4 mg total) by mouth every 8 (eight) hours as needed. 20 tablet 0   oxyCODONE (OXY IR/ROXICODONE) 5 MG immediate release tablet Take 1 tablet (5 mg total) by mouth every 6 (six) hours as needed for severe pain. Must last 30 days 120 tablet 0   rosuvastatin (CRESTOR) 5 MG tablet Take 5 mg by mouth 2 (two) times a week.     spironolactone (ALDACTONE) 25 MG tablet TAKE 1/2  (ONE-HALF) TABLET BY MOUTH DAILY  11   sucralfate (CARAFATE) 1 g tablet Take 1 tablet (1 g total) by mouth 4 (four) times daily. 60 tablet 0   traZODone (DESYREL) 50 MG tablet Take 50 mg by mouth at bedtime as needed.     No current facility-administered medications on file prior to visit.    There are no Patient Instructions on file for this visit. No follow-ups on file.   Kris Hartmann, NP

## 2021-03-24 ENCOUNTER — Telehealth: Payer: Self-pay | Admitting: Psychiatry

## 2021-03-24 NOTE — Telephone Encounter (Signed)
Lvm to sch appt °

## 2021-03-25 ENCOUNTER — Telehealth: Payer: Self-pay | Admitting: Psychiatry

## 2021-03-25 NOTE — Telephone Encounter (Signed)
Lvm to sch appt °

## 2021-04-04 NOTE — Progress Notes (Signed)
PROVIDER NOTE: Information contained herein reflects review and annotations entered in association with encounter. Interpretation of such information and data should be left to medically-trained personnel. Information provided to patient can be located elsewhere in the medical record under "Patient Instructions". Document created using STT-dictation technology, any transcriptional errors that may result from process are unintentional.  ?  ?Patient: Laura [name]  Service Category: E/M  Provider: Gaspar Cola, MD  ?DOB: June 30, 1945  DOS: 04/07/2021  Specialty: Interventional Pain Management  ?MRN: [id number]  Setting: Ambulatory outpatient  PCP: Adin Hector, MD  ?Type: Established Patient    Referring Provider: Adin Hector, MD  ?Location: Office  Delivery: Face-to-face    ? ?HPI  ?Laura [name], a 76 y.o. year old female, is here today because of her Chronic pain syndrome [G89.4]. Laura [name]'s primary complain today is Back Pain (lower) ?Last encounter: My last encounter with her was on 03/12/2021. ?Pertinent problems: Laura [name] has Seronegative rheumatoid arthritis (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); Lumbar foraminal stenosis (Bilateral L3-4 and L5-S1); Chronic hip pain (Left); Osteoarthritis of knee (Right); Arthritis; Fibromyalgia syndrome; Chronic arthralgias of knees and hips (Right); Chronic hand pain, left; Dropfoot (Left); Weakness of foot (Left); Sciatica (Left); Weakness of leg (Left); Chronic lower extremity pain (2ry area of Pain) (Bilateral); Lumbosacral radiculopathy  at L5 (Left); Abnormal MRI, lumbar spine (12/26/2019); DDD (degenerative disc disease), lumbosacral; Statin myopathy; Foot drop, left; 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 ?Pain Assessment: Severity of Chronic pain is reported as a 5 /10. Location: Back Right, Left/pain radiaities down both leg to her feet. Onset:  . Quality: Aching. Timing:  . Modifying factor(s): Meds and rest. ?Vitals:  height is 5\' 6"  (1.676 m) and weight is 200 lb (90.7 kg). Her temperature is 96.8 ?F (36 ?C) (abnormal). Her blood pressure is 113/97 (abnormal) and her pulse is 78. Her oxygen saturation is 100%.  ? ?Reason for encounter: medication management.   The patient indicates doing well with the current medication regimen. No adverse reactions or side effects reported to the medications.  ? ?The patient's daughter, who happens to also be a physical therapist has been taking care of her for quite some time.  She has noticed an increase in her bilateral lower extremity weakness specially on the left side with her foot drop.  She has also been experiencing intermittent incontinence, but she can still tell when she needs to go.  Today we had a long conversation regarding the results of her MRI done on December 2021.  She refers that her condition has continued to worsen.  At this point I will be ordering a repeat MRI of the lumbar spine.  In 2022 I put in 3 different referrals for neurosurgery to evaluate her for the severe spinal stenosis to see if she could be decompressed.  Today again I have recommended that they follow-up  with the neurosurgeons.  This time I have directed my conversation  primarily to the patient's daughter since Laura [name] seems to be having some cognitive impairment and does not seem to be keeping up with the things that I have recommended that she do.  Looking at her most recent lab work, I can also see that her kidney function has been decreasing and I have warned her daughter about accumulation of her pain medicine.  Today I have renewed her prescription so that she can continue taking 1 tablet p.o. 4 times daily, but I have instructed the daughter to decrease it to 3 times daily and even less, if she begins to notice that she gets a little disoriented when taking some of these medicines.  She understood and accepted. ? ?UDS ordered today.  ? ?RTCB: 06/07/2021 ? ? ?Pharmacotherapy Assessment  ?Analgesic: Oxycodone IR 5 mg every 6 hours (20 mg/day) ?MME/day: 30 mg/day.  ? ?Monitoring: ?[place] PMP: PDMP reviewed during this encounter.       ?Pharmacotherapy: No side-effects or adverse reactions reported. ?Compliance: No problems identified. ?Effectiveness: Clinically acceptable. ? ?Chauncey Fischer, RN  04/07/2021  3:46 PM  Sign when Signing Visit ?Nursing Pain Medication Assessment:  ?Safety precautions to be maintained throughout the outpatient stay will include: orient to surroundings, keep bed in low position, maintain call bell within reach at all times, provide assistance with transfer out of bed and ambulation.  ?Medication Inspection Compliance: Pill count conducted under aseptic conditions, in front of the patient. Neither the pills nor the bottle was removed from the patient's sight at any time. Once count was completed pills were immediately returned to the patient in their original bottle. ? ?Medication: Oxycodone IR ?Pill/Patch Count:  3 of 120 pills remain ?Pill/Patch Appearance: Markings consistent with prescribed medication ?Bottle Appearance: Standard pharmacy container. Clearly labeled. ?Filled Date: 2 / 62 / 2023 ?Last Medication intake:  TodaySafety precautions to be  maintained throughout the outpatient stay will include: orient to surroundings, keep bed in low position, maintain call bell within reach at all times, provide assistance with transfer out of bed and ambulation.  ?   UDS:  ?Summary  ?Date Value Ref Range Status  ?07/09/2020 Note  Final  ?  Comment:  ?  ==================================================================== ?[employer] Select 13 (MW) ?==================================================================== ?Test                             Result       Flag       Units ? ?Drug Absent but Declared for Prescription Verification ?  Oxycodone                      Not Detected UNEXPECTED ng/mg creat ?==================================================================== ?Test                      Result    Flag   Units      Ref Range ?  Creatinine              67               mg/dL      >=20 ?==================================================================== ?Declared Medications: ? The flagging and interpretation on this report are based on the ? following declared medications.  Unexpected results may arise from ? inaccuracies in the declared medications. ? ? **Note: The testing scope of this panel includes these medications: ? ? Oxycodone ? ? **  Note: The testing scope of this panel does not include the ? following reported medications: ? ? Albuterol ? Aspirin ? Azelastine ? Chlorthalidone ? Cholecalciferol ? Duloxetine (Cymbalta) ? Empagliflozin (Jardiance) ? Hydroxychloroquine (Plaquenil) ? Hydroxyzine ? Infliximab (Remicade) ? Levothyroxine ? Loratadine (Claritin) ? Methotrexate ? Metoprolol ? Multivitamin ? Rosuvastatin ? Spironolactone ? Valacyclovir (Valtrex) ? Zaleplon (Sonata) ?==================================================================== ?For clinical consultation, please call [phone]. ?==================================================================== ?  ?  ? ?ROS  ?Constitutional: Denies any fever or chills ?Gastrointestinal: No  reported hemesis, hematochezia, vomiting, or acute GI distress ?Musculoskeletal: Denies any acute onset joint swelling, redness, loss of ROM, or weakness ?Neurological: No reported episodes of acute onset apr

## 2021-04-06 ENCOUNTER — Encounter: Payer: Self-pay | Admitting: Pain Medicine

## 2021-04-07 ENCOUNTER — Encounter: Payer: Self-pay | Admitting: Pain Medicine

## 2021-04-07 ENCOUNTER — Ambulatory Visit: Payer: Medicare Other | Attending: Pain Medicine | Admitting: Pain Medicine

## 2021-04-07 VITALS — BP 113/97 | HR 78 | Temp 96.8°F | Ht 66.0 in | Wt 200.0 lb

## 2021-04-07 DIAGNOSIS — M25561 Pain in right knee: Secondary | ICD-10-CM | POA: Insufficient documentation

## 2021-04-07 DIAGNOSIS — M79604 Pain in right leg: Secondary | ICD-10-CM | POA: Insufficient documentation

## 2021-04-07 DIAGNOSIS — M5137 Other intervertebral disc degeneration, lumbosacral region: Secondary | ICD-10-CM | POA: Insufficient documentation

## 2021-04-07 DIAGNOSIS — G8929 Other chronic pain: Secondary | ICD-10-CM | POA: Insufficient documentation

## 2021-04-07 DIAGNOSIS — M25552 Pain in left hip: Secondary | ICD-10-CM | POA: Diagnosis present

## 2021-04-07 DIAGNOSIS — M5442 Lumbago with sciatica, left side: Secondary | ICD-10-CM | POA: Insufficient documentation

## 2021-04-07 DIAGNOSIS — M159 Polyosteoarthritis, unspecified: Secondary | ICD-10-CM | POA: Insufficient documentation

## 2021-04-07 DIAGNOSIS — R937 Abnormal findings on diagnostic imaging of other parts of musculoskeletal system: Secondary | ICD-10-CM | POA: Insufficient documentation

## 2021-04-07 DIAGNOSIS — M797 Fibromyalgia: Secondary | ICD-10-CM | POA: Insufficient documentation

## 2021-04-07 DIAGNOSIS — Z79891 Long term (current) use of opiate analgesic: Secondary | ICD-10-CM | POA: Insufficient documentation

## 2021-04-07 DIAGNOSIS — G894 Chronic pain syndrome: Secondary | ICD-10-CM | POA: Insufficient documentation

## 2021-04-07 DIAGNOSIS — M79605 Pain in left leg: Secondary | ICD-10-CM | POA: Diagnosis present

## 2021-04-07 DIAGNOSIS — M5441 Lumbago with sciatica, right side: Secondary | ICD-10-CM | POA: Diagnosis present

## 2021-04-07 DIAGNOSIS — Z79899 Other long term (current) drug therapy: Secondary | ICD-10-CM | POA: Diagnosis present

## 2021-04-07 MED ORDER — OXYCODONE HCL 5 MG PO TABS: 5.0000 mg | ORAL_TABLET | Freq: Four times a day (QID) | ORAL | 0 refills | Status: DC | PRN

## 2021-04-07 NOTE — Patient Instructions (Signed)
____________________________________________________________________________________________ ? ?Medication Rules ? ?Purpose: To inform patients, and their family members, of our rules and regulations. ? ?Applies to: All patients receiving prescriptions (written or electronic). ? ?Pharmacy of record: Pharmacy where electronic prescriptions will be sent. If written prescriptions are taken to a different pharmacy, please inform the nursing staff. The pharmacy listed in the electronic medical record should be the one where you would like electronic prescriptions to be sent. ? ?Electronic prescriptions: In compliance with the [place] Strengthen Opioid Misuse Prevention (STOP) Act of 2017 (Session Law 2017-74/H243), effective January 10, 2018, all controlled substances must be electronically prescribed. Calling prescriptions to the pharmacy will cease to exist. ? ?Prescription refills: Only during scheduled appointments. Applies to all prescriptions. ? ?NOTE: The following applies primarily to controlled substances (Opioid* Pain Medications).  ? ?Type of encounter (visit): For patients receiving controlled substances, face-to-face visits are required. (Not an option or up to the patient.) ? ?Patient's responsibilities: ?Pain Pills: Bring all pain pills to every appointment (except for procedure appointments). ?Pill Bottles: Bring pills in original pharmacy bottle. Always bring the newest bottle. Bring bottle, even if empty. ?Medication refills: You are responsible for knowing and keeping track of what medications you take and those you need refilled. ?The day before your appointment: write a list of all prescriptions that need to be refilled. ?The day of the appointment: give the list to the admitting nurse. Prescriptions will be written only during appointments. No prescriptions will be written on procedure days. ?If you forget a medication: it will not be "Called in", "Faxed", or "electronically sent". You will  need to get another appointment to get these prescribed. ?No early refills. Do not call asking to have your prescription filled early. ?Prescription Accuracy: You are responsible for carefully inspecting your prescriptions before leaving our office. Have the discharge nurse carefully go over each prescription with you, before taking them home. Make sure that your name is accurately spelled, that your address is correct. Check the name and dose of your medication to make sure it is accurate. Check the number of pills, and the written instructions to make sure they are clear and accurate. Make sure that you are given enough medication to last until your next medication refill appointment. ?Taking Medication: Take medication as prescribed. When it comes to controlled substances, taking less pills or less frequently than prescribed is permitted and encouraged. ?Never take more pills than instructed. ?Never take medication more frequently than prescribed.  ?Inform other Doctors: Always inform, all of your healthcare providers, of all the medications you take. ?Pain Medication from other Providers: You are not allowed to accept any additional pain medication from any other Doctor or Healthcare provider. There are two exceptions to this rule. (see below) In the event that you require additional pain medication, you are responsible for notifying us, as stated below. ?Cough Medicine: Often these contain an opioid, such as codeine or hydrocodone. Never accept or take cough medicine containing these opioids if you are already taking an opioid* medication. The combination may cause respiratory failure and death. ?Medication Agreement: You are responsible for carefully reading and following our Medication Agreement. This must be signed before receiving any prescriptions from our practice. Safely store a copy of your signed Agreement. Violations to the Agreement will result in no further prescriptions. (Additional copies of our  Medication Agreement are available upon request.) ?Laws, Rules, & Regulations: All patients are expected to follow all Federal and State Laws, Statutes, Rules, & Regulations. Ignorance of   the Laws does not constitute a valid excuse.  ?Illegal drugs and Controlled Substances: The use of illegal substances (including, but not limited to marijuana and its derivatives) and/or the illegal use of any controlled substances is strictly prohibited. Violation of this rule may result in the immediate and permanent discontinuation of any and all prescriptions being written by our practice. The use of any illegal substances is prohibited. ?Adopted CDC guidelines & recommendations: Target dosing levels will be at or below 60 MME/day. Use of benzodiazepines** is not recommended. ? ?Exceptions: There are only two exceptions to the rule of not receiving pain medications from other Healthcare Providers. ?Exception #1 (Emergencies): In the event of an emergency (i.e.: accident requiring emergency care), you are allowed to receive additional pain medication. However, you are responsible for: As soon as you are able, call our office [phone], at any time of the day or night, and leave a message stating your name, the date and nature of the emergency, and the name and dose of the medication prescribed. In the event that your call is answered by a member of our staff, make sure to document and save the date, time, and the name of the person that took your information.  ?Exception #2 (Planned Surgery): In the event that you are scheduled by another doctor or dentist to have any type of surgery or procedure, you are allowed (for a period no longer than 30 days), to receive additional pain medication, for the acute post-op pain. However, in this case, you are responsible for picking up a copy of our "Post-op Pain Management for Surgeons" handout, and giving it to your surgeon or dentist. This document is available at our office, and  does not require an appointment to obtain it. Simply go to our office during business hours (Monday-Thursday from 8:00 AM to 4:00 PM) (Friday 8:00 AM to 12:00 Noon) or if you have a scheduled appointment with us, prior to your surgery, and ask for it by name. In addition, you are responsible for: calling our office [phone], at any time of the day or night, and leaving a message stating your name, name of your surgeon, type of surgery, and date of procedure or surgery. Failure to comply with your responsibilities may result in termination of therapy involving the controlled substances. ?Medication Agreement Violation. Following the above rules, including your responsibilities will help you in avoiding a Medication Agreement Violation (?Breaking your Pain Medication Contract?). ? ?*Opioid medications include: morphine, codeine, oxycodone, oxymorphone, hydrocodone, hydromorphone, meperidine, tramadol, tapentadol, buprenorphine, fentanyl, methadone. ?**Benzodiazepine medications include: diazepam (Valium), alprazolam (Xanax), clonazepam (Klonopine), lorazepam (Ativan), clorazepate (Tranxene), chlordiazepoxide (Librium), estazolam (Prosom), oxazepam (Serax), temazepam (Restoril), triazolam (Halcion) ?(Last updated: 10/07/2020) ?____________________________________________________________________________________________ ? ____________________________________________________________________________________________ ? ?Medication Recommendations and Reminders ? ?Applies to: All patients receiving prescriptions (written and/or electronic). ? ?Medication Rules & Regulations: These rules and regulations exist for your safety and that of others. They are not flexible and neither are we. Dismissing or ignoring them will be considered "non-compliance" with medication therapy, resulting in complete and irreversible termination of such therapy. (See document titled "Medication Rules" for more details.) In all conscience,  because of safety reasons, we cannot continue providing a therapy where the patient does not follow instructions. ? ?Pharmacy of record:  ?Definition: This is the pharmacy where your electronic prescriptions w

## 2021-04-07 NOTE — Progress Notes (Signed)
Nursing Pain Medication Assessment:  ?Safety precautions to be maintained throughout the outpatient stay will include: orient to surroundings, keep bed in low position, maintain call bell within reach at all times, provide assistance with transfer out of bed and ambulation.  ?Medication Inspection Compliance: Pill count conducted under aseptic conditions, in front of the patient. Neither the pills nor the bottle was removed from the patient's sight at any time. Once count was completed pills were immediately returned to the patient in their original bottle. ? ?Medication: Oxycodone IR ?Pill/Patch Count:  3 of 120 pills remain ?Pill/Patch Appearance: Markings consistent with prescribed medication ?Bottle Appearance: Standard pharmacy container. Clearly labeled. ?Filled Date: 2 / 55 / 2023 ?Last Medication intake:  TodaySafety precautions to be maintained throughout the outpatient stay will include: orient to surroundings, keep bed in low position, maintain call bell within reach at all times, provide assistance with transfer out of bed and ambulation.  ?

## 2021-04-21 ENCOUNTER — Other Ambulatory Visit (INDEPENDENT_AMBULATORY_CARE_PROVIDER_SITE_OTHER): Payer: Self-pay | Admitting: Nurse Practitioner

## 2021-04-21 DIAGNOSIS — I890 Lymphedema, not elsewhere classified: Secondary | ICD-10-CM

## 2021-04-26 ENCOUNTER — Ambulatory Visit (INDEPENDENT_AMBULATORY_CARE_PROVIDER_SITE_OTHER): Payer: Medicare Other | Admitting: Vascular Surgery

## 2021-04-26 ENCOUNTER — Ambulatory Visit (INDEPENDENT_AMBULATORY_CARE_PROVIDER_SITE_OTHER): Payer: Medicare Other

## 2021-04-26 ENCOUNTER — Ambulatory Visit: Payer: Medicare Other | Admitting: Nurse Practitioner

## 2021-04-26 ENCOUNTER — Encounter (INDEPENDENT_AMBULATORY_CARE_PROVIDER_SITE_OTHER): Payer: Self-pay | Admitting: Vascular Surgery

## 2021-04-26 VITALS — BP 120/71 | HR 67 | Resp 17

## 2021-04-26 DIAGNOSIS — I890 Lymphedema, not elsewhere classified: Secondary | ICD-10-CM | POA: Diagnosis not present

## 2021-04-26 DIAGNOSIS — I2510 Atherosclerotic heart disease of native coronary artery without angina pectoris: Secondary | ICD-10-CM

## 2021-04-26 DIAGNOSIS — E7800 Pure hypercholesterolemia, unspecified: Secondary | ICD-10-CM | POA: Diagnosis not present

## 2021-04-26 DIAGNOSIS — I10 Essential (primary) hypertension: Secondary | ICD-10-CM

## 2021-04-26 DIAGNOSIS — I480 Paroxysmal atrial fibrillation: Secondary | ICD-10-CM

## 2021-04-26 NOTE — Progress Notes (Signed)
? ? ? ? ?MRN : [id number] ? ?Laura [name] is a 76 y.o. ([birth year]) female who presents with chief complaint of legs swell. ? ?History of Present Illness:  ? ?The patient returns to the office for followup evaluation regarding leg swelling.  The swelling has persisted and the pain associated with swelling continues. There have not been any interval development of a ulcerations or wounds. ? ?Since the previous visit the patient has been wearing graduated compression stockings and has noted little if any improvement in the lymphedema. The patient has been using compression routinely morning until night. ? ?The patient also states elevation during the day and exercise is being done too.  ? ?Venous duplex done today shows a normal deep venous system and no superficial reflux identified. ? ?No outpatient medications have been marked as taking for the 04/26/21 encounter (Appointment) with Delana Meyer, Dolores Lory, MD.  ? ? ?Past Medical History:  ?Diagnosis Date  ? Allergy   ? Anxiety   ? Arthritis, degenerative 10/08/2013  ? Overview:    a.  Lumbar spine/spinal stenosis/foot drop.   b.  Hands.   ? Asthma   ? Chronic hand pain, left 10/10/2019  ? Chronic kidney disease   ? Depression   ? 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 Major depression, single episode, in complete remission ([place]) 06/25/2015  ? Memory loss, short term 03/19/2014  ? Rheumatoid arthritis ([place])   ? Sciatica of left side 12/31/2019  ? Seizure ([place]) 10/07/2014  ? Sleep apnea   ? Sleep apnea   ? Thyroid disease   ? ? ?Past Surgical History:  ?Procedure Laterality Date  ? ABDOMINAL HYSTERECTOMY    ? CESAREAN SECTION    ? HIP SURGERY Right   ? REPLACEMENT TOTAL KNEE Left   ? ? ?Social History ?Social History  ? ?Tobacco Use  ? Smoking status: Never  ? Smokeless tobacco: Never  ?Vaping Use  ? Vaping Use: Never used  ?Substance Use Topics  ? Alcohol use: No  ?  Alcohol/week: 0.0 standard drinks  ? Drug use: No  ? ? ?Family  History ?Family History  ?Problem Relation Age of Onset  ? Hypertension Mother   ? Stroke Mother   ? Heart attack Father   ? Alcohol abuse Father   ? Depression Father   ? Post-traumatic stress disorder Father   ? Rheum arthritis Sister   ? Hypertension Sister   ? Depression Sister   ? Breast cancer Neg Hx   ? ? ?Allergies  ?Allergen Reactions  ? Bupropion   ? Jardiance [Empagliflozin] Other (See Comments)  ? Penicillins Rash  ? ? ? ?REVIEW OF SYSTEMS (Negative unless checked) ? ?Constitutional: [] Weight loss  [] Fever  [] Chills ?Cardiac: [] Chest pain   [] Chest pressure   [] Palpitations   [] Shortness of breath when laying flat   [] Shortness of breath with exertion. ?Vascular:  [] Pain in legs with walking   [x] Pain in legs with standing  [] History of DVT   [] Phlebitis   [x] Swelling in legs   [] Varicose veins   [] Non-healing ulcers ?Pulmonary:   [] Uses home oxygen   [] Productive cough   [] Hemoptysis   [] Wheeze  [] COPD   [] Asthma ?Neurologic:  [] Dizziness   [] Seizures   [] History of stroke   [] History of TIA  [] Aphasia   [] Vissual changes   [] Weakness or numbness in arm   [] Weakness or numbness in leg ?Musculoskeletal:   [] Joint swelling   [] Joint pain   [] Low back pain ?Hematologic:  []   Easy bruising  [] Easy bleeding   [] Hypercoagulable state   [] Anemic ?Gastrointestinal:  [] Diarrhea   [] Vomiting  [] Gastroesophageal reflux/heartburn   [] Difficulty swallowing. ?Genitourinary:  [] Chronic kidney disease   [] Difficult urination  [] Frequent urination   [] Blood in urine ?Skin:  [] Rashes   [] Ulcers  ?Psychological:  [] History of anxiety   []  History of major depression. ? ?Physical Examination ? ?There were no vitals filed for this visit. ?There is no height or weight on file to calculate BMI. ?Gen: WD/WN, NAD ?Head: [place]/AT, No temporalis wasting.  ?Ear/Nose/Throat: Hearing grossly intact, nares w/o erythema or drainage, pinna without lesions ?Eyes: PER, EOMI, sclera nonicteric.  ?Neck: Supple, no gross masses.  No JVD.   ?Pulmonary:  Good air movement, no audible wheezing, no use of accessory muscles.  ?Cardiac: RRR, precordium not hyperdynamic. ?Vascular:  scattered varicosities present bilaterally.  Mild venous stasis changes to the legs bilaterally.  3-4+ soft pitting edema particularly over the dorsum of the feet ?Vessel Right Left  ?Radial Palpable Palpable  ?Gastrointestinal: soft, non-distended. No guarding/no peritoneal signs.  ?Musculoskeletal: M/S 5/5 throughout.  No deformity.  ?Neurologic: CN 2-12 intact. Pain and light touch intact in extremities.  Symmetrical.  Speech is fluent. Motor exam as listed above. ?Psychiatric: Judgment intact, Mood & affect appropriate for pt's clinical situation. ?Dermatologic: Venous rashes no ulcers noted.  No changes consistent with cellulitis. ?Lymph : No lichenification or skin changes of chronic lymphedema. ? ?CBC ?Lab Results  ?Component Value Date  ? WBC 9.8 02/15/2021  ? HGB 12.7 02/15/2021  ? HCT 39.0 02/15/2021  ? MCV 92.9 02/15/2021  ? PLT 254 02/15/2021  ? ? ?BMET ?   ?Component Value Date/Time  ? NA 132 (L) 02/15/2021 0708  ? NA 138 06/12/2013 1508  ? K 4.2 02/15/2021 0708  ? K 4.1 06/12/2013 1508  ? CL 95 (L) 02/15/2021 0708  ? CL 102 06/12/2013 1508  ? CO2 29 02/15/2021 0708  ? CO2 32 06/12/2013 1508  ? GLUCOSE 149 (H) 02/15/2021 0708  ? GLUCOSE 109 (H) 06/12/2013 1508  ? BUN 34 (H) 02/15/2021 0708  ? BUN 20 (H) 06/12/2013 1508  ? CREATININE 1.37 (H) 02/15/2021 0708  ? CREATININE 1.42 (H) 06/12/2013 1508  ? CALCIUM 9.4 02/15/2021 0708  ? CALCIUM 8.6 06/12/2013 1508  ? GFRNONAA 40 (L) 02/15/2021 0708  ? GFRNONAA 38 (L) 06/12/2013 1508  ? GFRAA 57 (L) 10/03/2019 1612  ? GFRAA 44 (L) 06/12/2013 1508  ? ?CrCl cannot be calculated (Patient's most recent lab result is older than the maximum 21 days allowed.). ? ?COAG ?No results found for: INR, PROTIME ? ?Radiology ?No results found. ? ? ?Assessment/Plan ?1. Lymphedema ?Recommend: ? ?No surgery or intervention at this point in  time.   ? ?I have reviewed my discussion with the patient regarding lymphedema and why it  causes symptoms.  Patient will continue wearing graduated compression on a daily basis. The patient should put the compression on first thing in the morning and removing them in the evening. The patient should not sleep in the compression.  ? ?In addition, behavioral modification throughout the day will be continued.  This will include frequent elevation (such as in a recliner), use of over the counter pain medications as needed and exercise such as walking. ? ?The systemic causes for chronic edema such as liver, kidney and cardiac etiologies do not appear to have significant changed over the past year.   ? ?Despite conservative treatments including graduated compression therapy class 1  and behavioral modification including exercise and elevation the patient  has not obtained adequate control of the lymphedema.  The patient still has stage 3 lymphedema and therefore, I believe that a lymph pump should be added to improve the control of the patient's lymphedema.  Additionally, a lymph pump is warranted because it will reduce the risk of cellulitis and ulceration in the future. ? ?Patient should follow-up in six months   ? ?2. Essential (primary) hypertension ?Continue antihypertensive medications as already ordered, these medications have been reviewed and there are no changes at this time.  ? ?3. Coronary artery disease involving native coronary artery of native heart without angina pectoris ?Continue cardiac and antihypertensive medications as already ordered and reviewed, no changes at this time. ? ?Continue statin as ordered and reviewed, no changes at this time ? ?Nitrates PRN for chest pain  ? ?4. Hypercholesterolemia ?Continue statin as ordered and reviewed, no changes at this time  ? ?5. Paroxysmal atrial fibrillation (HCC) ?Continue antiarrhythmia medications as already ordered, these medications have been reviewed and  there are no changes at this time. ? ?Continue anticoagulation as ordered by Cardiology Service  ? ? ? ?Hortencia Pilar, MD ? ?04/26/2021 ?12:55 PM ? ?  ?

## 2021-04-29 ENCOUNTER — Ambulatory Visit
Admission: RE | Admit: 2021-04-29 | Discharge: 2021-04-29 | Disposition: A | Discharge status: Home / Self Care | Payer: Medicare Other | Source: Ambulatory Visit | Attending: Pain Medicine | Admitting: Pain Medicine

## 2021-04-29 DIAGNOSIS — M5441 Lumbago with sciatica, right side: Secondary | ICD-10-CM | POA: Insufficient documentation

## 2021-04-29 DIAGNOSIS — M5442 Lumbago with sciatica, left side: Secondary | ICD-10-CM | POA: Insufficient documentation

## 2021-04-29 DIAGNOSIS — M79605 Pain in left leg: Secondary | ICD-10-CM | POA: Diagnosis present

## 2021-04-29 DIAGNOSIS — M79604 Pain in right leg: Secondary | ICD-10-CM | POA: Diagnosis present

## 2021-04-29 DIAGNOSIS — M25552 Pain in left hip: Secondary | ICD-10-CM | POA: Diagnosis present

## 2021-04-29 DIAGNOSIS — M25561 Pain in right knee: Secondary | ICD-10-CM | POA: Insufficient documentation

## 2021-04-29 DIAGNOSIS — G8929 Other chronic pain: Secondary | ICD-10-CM

## 2021-04-29 DIAGNOSIS — M5137 Other intervertebral disc degeneration, lumbosacral region: Secondary | ICD-10-CM

## 2021-04-29 DIAGNOSIS — M51379 Other intervertebral disc degeneration, lumbosacral region without mention of lumbar back pain or lower extremity pain: Secondary | ICD-10-CM

## 2021-04-29 IMAGING — MR MR LUMBAR SPINE W/O CM
5 series · 31 of 48 positions shown · non-contrast
Comparison: [DATE]

CLINICAL DATA: Low back pain with symptoms persisting over 6 weeks.
Spinal stenosis

EXAM:
MRI LUMBAR SPINE WITHOUT CONTRAST
TECHNIQUE: Multiplanar, multisequence MR imaging of the lumbar spine was
performed. No intravenous contrast was administered.

[Series 5: T2 · sagittal · 4.0mm · 0.81mm/px · 6 of 17 slices shown (1 of 2)]
[im 1/17]
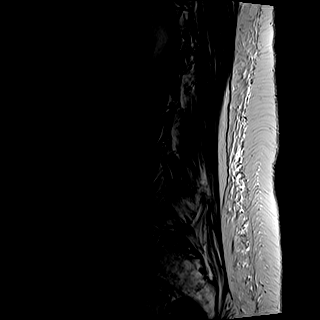
[im 4/17]
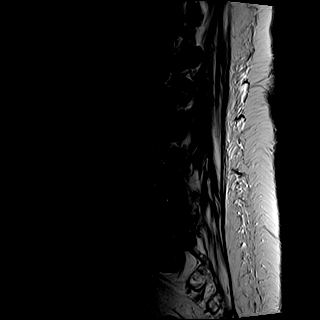
[im 7/17]
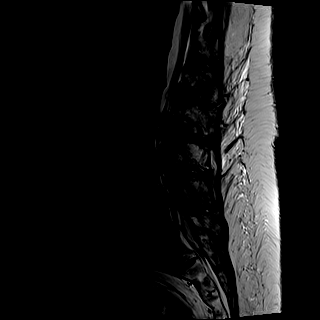
[im 10/17]
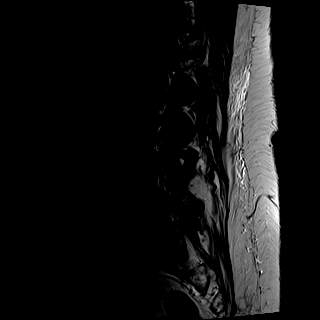
[im 13/17]
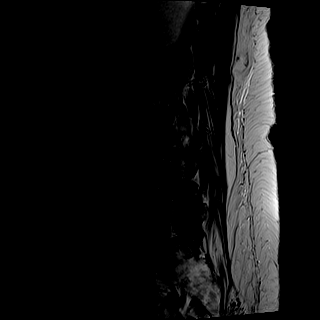
[im 17/17]
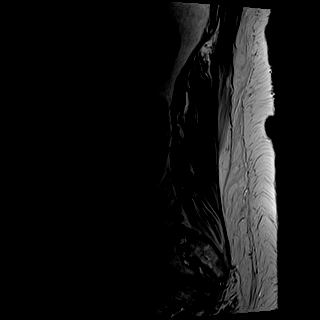

[Series 6: T1 · sagittal · 4.0mm · 0.81mm/px · 7 of 17 slices shown (1 of 2)]
[im 1/17]
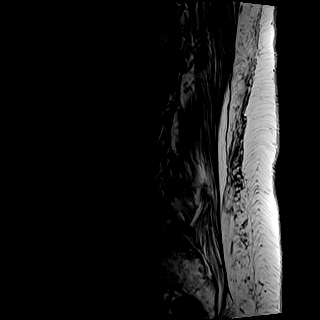
[im 3/17]
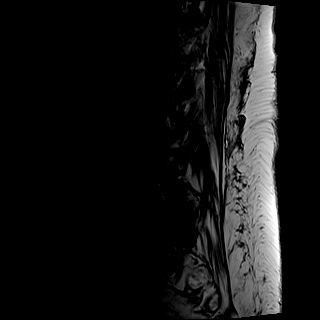
[im 6/17]
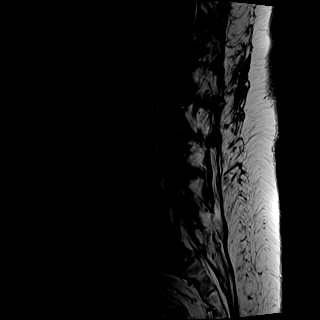
[im 9/17]
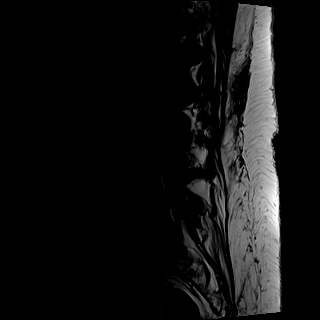
[im 11/17]
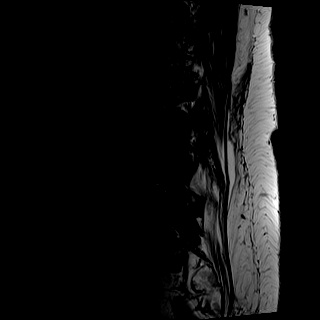
[im 14/17]
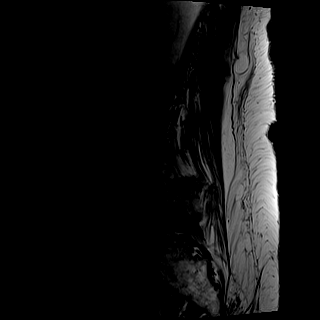
[im 17/17]
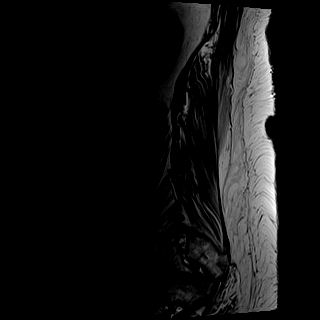

[Series 7: STIR · sagittal · 4.0mm · 0.41mm/px · 2 of 17 slices shown]
[im 1/17]
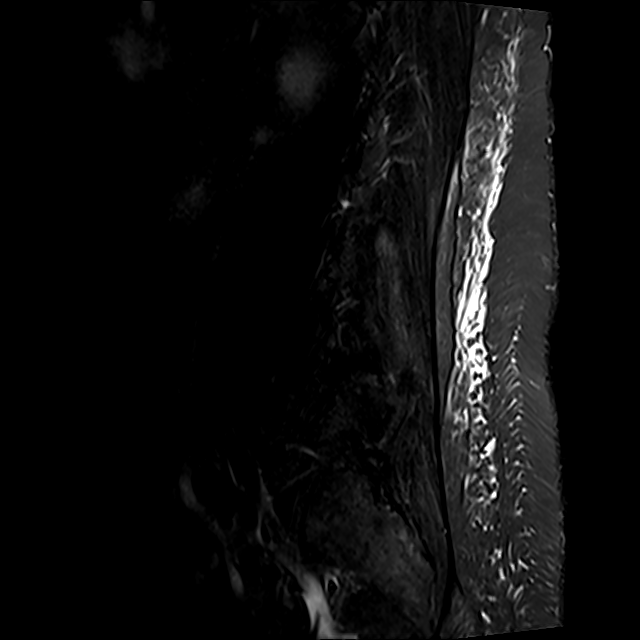
[im 3/17]
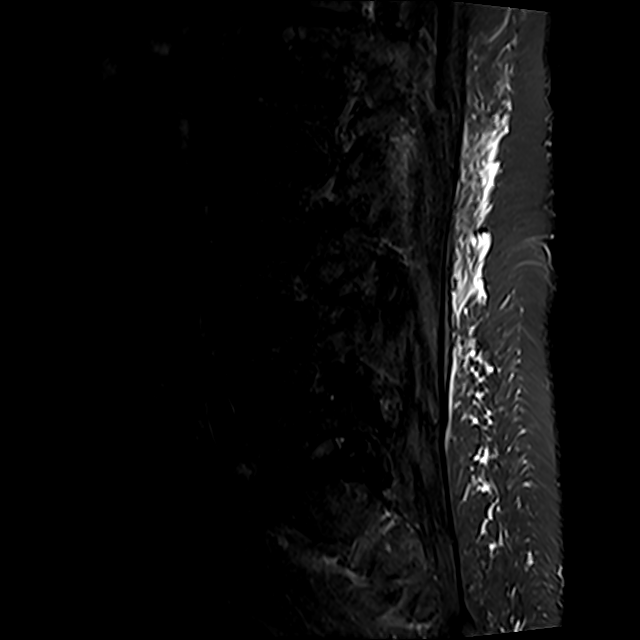

[Series 8: T2 · axial · 4.0mm · 0.78mm/px · z∈[-17,+187]mm · 8 of 36 slices shown (2 of 2)]
[im 1/36]
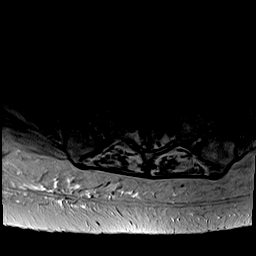
[im 6/36]
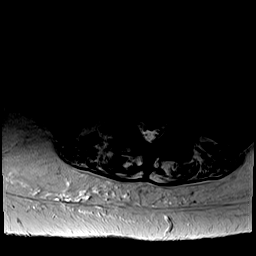
[im 11/36]
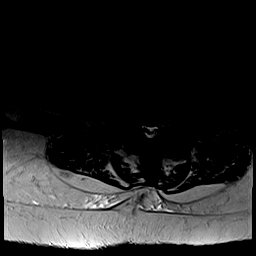
[im 17/36]
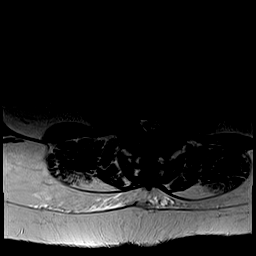
[im 19/36]
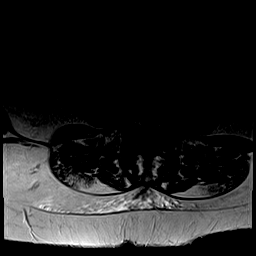
[im 25/36]
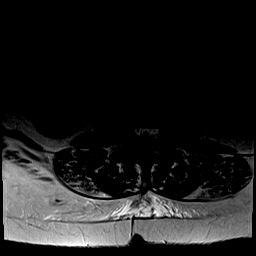
[im 30/36]
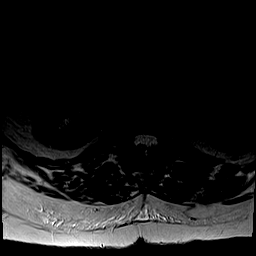
[im 36/36]
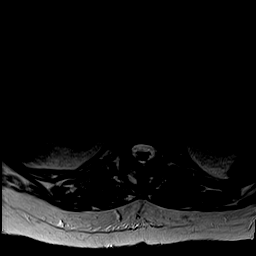

[Series 10: T1 · axial · 4.0mm · 0.39mm/px · z∈[-17,+187]mm · 8 of 36 slices shown (2 of 2)]
[im 1/36]
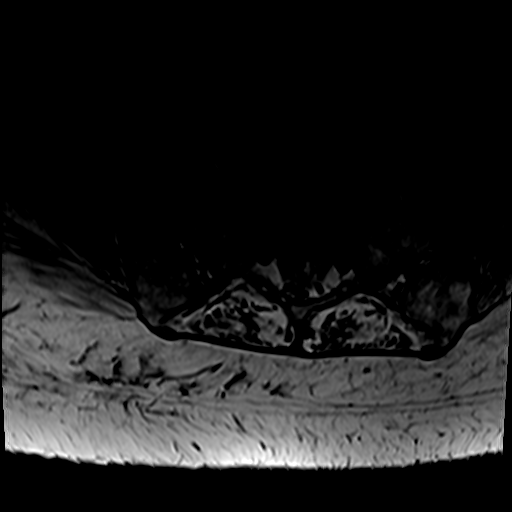
[im 6/36]
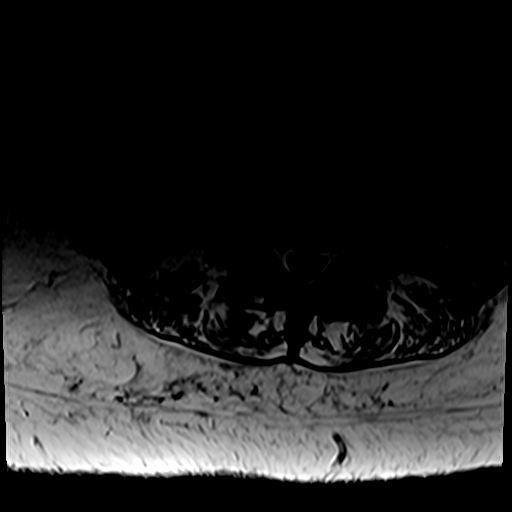
[im 11/36]
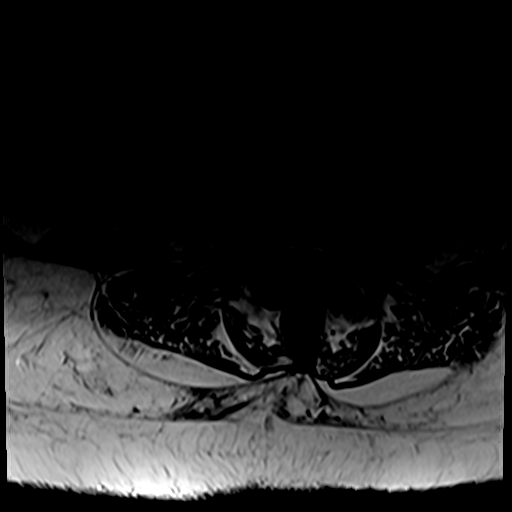
[im 17/36]
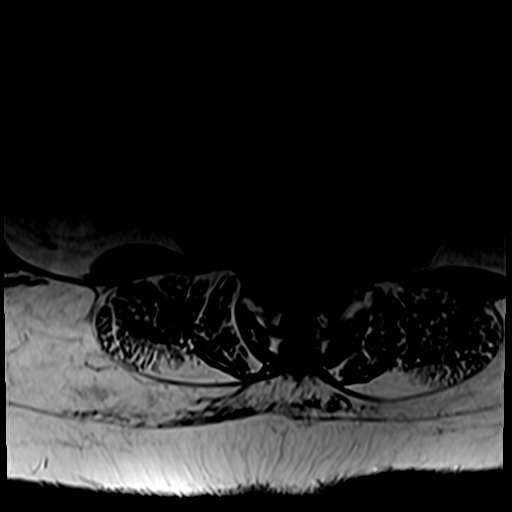
[im 19/36]
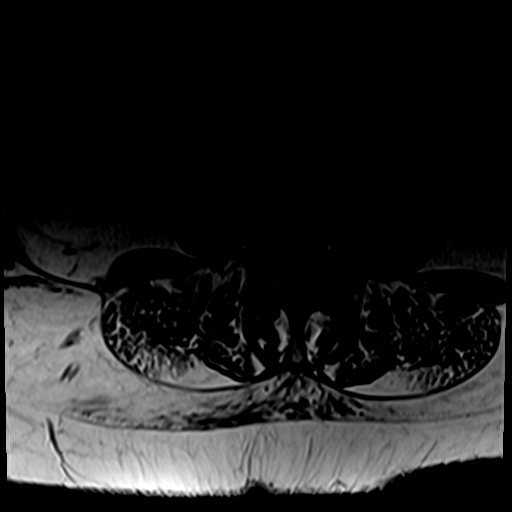
[im 25/36]
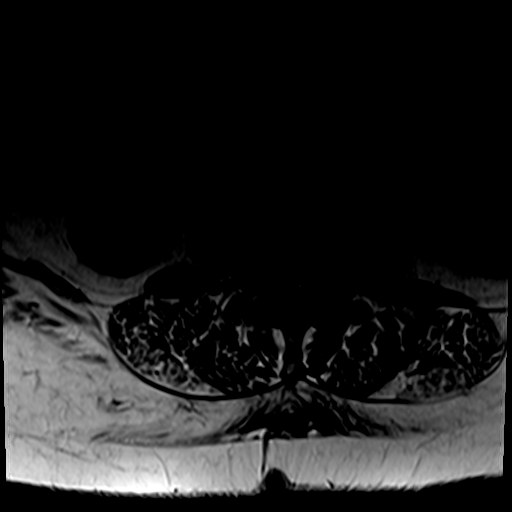
[im 30/36]
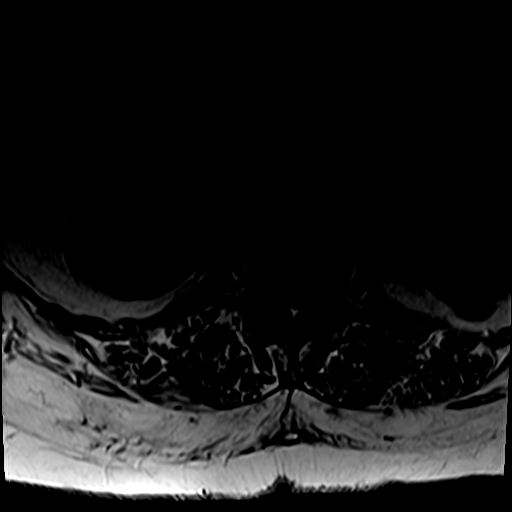
[im 36/36]
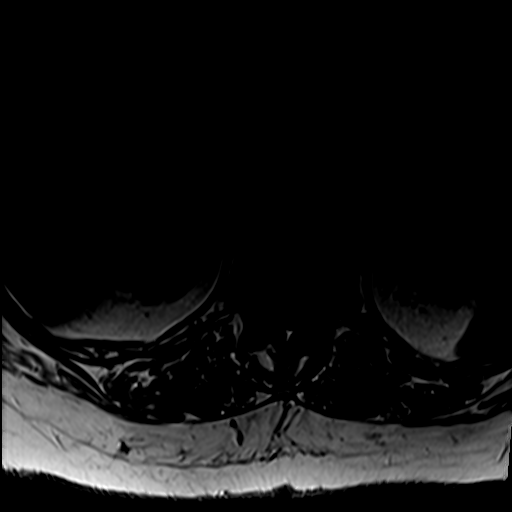

[31 of 48 positions shown; findings below may reference images not displayed]

FINDINGS: Segmentation:  5 lumbar type vertebrae

Alignment:  Degenerative grade 1 anterolisthesis at L3-4 and L4-5

Vertebrae:  No fracture, evidence of discitis, or bone lesion.

Conus medullaris and cauda equina: Conus extends to the T12-L1
level. Conus appears normal. There is cauda equina redundancy due to
the degree of severe spinal stenosis at L3-4.

Paraspinal and other soft tissues: Fatty atrophy of intrinsic back
muscles.

Disc levels:

T12- L1: Unremarkable.

L1-L2: Mild disc narrowing and bulging. Mild facet spurring. Mild
spinal stenosis

L2-L3: Disc narrowing and bulging with facet spurring and ligamentum
flavum thickening. Moderate spinal stenosis that is similar or
mildly progressed from before. Noncompressive bilateral foraminal
narrowing based on sagittal images

L3-L4: Facet osteoarthritis with spurring and ligamentous
thickening. Mild anterolisthesis. Circumferential disc bulging with
severe spinal stenosis effacing CSF. Noncompressive bilateral
foraminal narrowing

L4-L5: Disc narrowing and bulging. Advanced facet osteoarthritis
with spurring and anterolisthesis. Moderate spinal stenosis.
Bilateral L5 impingement at the subarticular recesses. Patent
foramina

L5-S1:Disc narrowing and bulging with endplate and facet spurring.
Patent canal and foramina.
IMPRESSION: 1. Advanced lumbar spine degeneration with L3-4 and L4-5
anterolisthesis.
2. L3-4 chronic, severe spinal stenosis.
3. L2-3 and L4-5 moderate to advanced spinal stenosis. Bilateral L5
impingement at the subarticular recesses of L4-5.

## 2021-06-01 ENCOUNTER — Telehealth: Payer: Self-pay

## 2021-06-01 DIAGNOSIS — F3342 Major depressive disorder, recurrent, in full remission: Secondary | ICD-10-CM

## 2021-06-01 NOTE — Telephone Encounter (Signed)
pt left message that she needs refills on the duloxetine

## 2021-06-01 NOTE — Telephone Encounter (Signed)
Patient has not been seen for an appointment in a long time.  Her last appointment in January was a no-show since she was in the emergency department.  I will have Janett Billow CMA contact this patient to advise that she needs to schedule an appointment.  Once appointment is scheduled will refill medications.

## 2021-06-02 ENCOUNTER — Other Ambulatory Visit: Payer: Self-pay | Admitting: Psychiatry

## 2021-06-02 DIAGNOSIS — F3342 Major depressive disorder, recurrent, in full remission: Secondary | ICD-10-CM

## 2021-06-03 NOTE — Telephone Encounter (Signed)
left message to call office to set up an appt to be seen.  ?

## 2021-06-20 NOTE — Progress Notes (Unsigned)
PROVIDER NOTE: Information contained herein reflects review and annotations entered in association with encounter. Interpretation of such information and data should be left to medically-trained personnel. Information provided to patient can be located elsewhere in the medical record under "Patient Instructions". Document created using STT-dictation technology, any transcriptional errors that may result from process are unintentional.    Patient: Laura [name]  Service Category: E/M  Provider: Gaspar Cola, MD  DOB: [birth year]  DOS: 06/21/2021  Specialty: Interventional Pain Management  MRN: [id number]  Setting: Ambulatory outpatient  PCP: Adin Hector, MD  Type: Established Patient    Referring Provider: Adin Hector, MD  Location: Office  Delivery: Face-to-face     HPI  [name], a 76 y.o. year old female, is here today because of her No primary diagnosis found.. Laura [name]'s primary complain today is No chief complaint on file. Last encounter: My last encounter with her was on 04/07/2021. 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); Lumbar foraminal stenosis (Bilateral L3-4 and L5-S1); Chronic hip pain (Left); Osteoarthritis of knee (Right); Arthritis; Fibromyalgia syndrome; Chronic arthralgias of knees and hips (Right); Chronic hand pain, left; Dropfoot (Left); Weakness of foot (Left); Sciatica (Left); Weakness of leg (Left); Chronic lower extremity pain (2ry area of Pain) (Bilateral); Lumbosacral  radiculopathy at L5 (Left); Abnormal MRI, lumbar spine (12/26/2019); DDD (degenerative disc disease), lumbosacral; Statin myopathy; Foot drop, left; 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 Pain Assessment: Severity of   is reported as a  /10. Location:    / . Onset:  . Quality:  . Timing:  . Modifying factor(s):  Marland Kitchen Vitals:  vitals were not taken for this visit.   Reason for encounter:  *** . ***  Pharmacotherapy Assessment  Analgesic: Oxycodone IR 5 mg every 6 hours (20 mg/day) MME/day: 30 mg/day.   Monitoring: [place] PMP: PDMP reviewed during this encounter.       Pharmacotherapy: No side-effects or adverse reactions reported. Compliance: No problems identified. Effectiveness: Clinically acceptable.  No notes on file  UDS:  Summary  Date Value Ref Range Status  07/09/2020 Note  Final    Comment:    ==================================================================== [employer] Select 13 (MW) ==================================================================== Test                             Result       Flag       Units  Drug Absent but Declared for Prescription Verification   Oxycodone                      Not Detected UNEXPECTED ng/mg creat ==================================================================== Test                      Result    Flag   Units  Ref Range   Creatinine              67               mg/dL      >=20 ==================================================================== Declared Medications:  The flagging and interpretation on this report are based on the  following declared medications.  Unexpected results may  arise from  inaccuracies in the declared medications.   **Note: The testing scope of this panel includes these medications:   Oxycodone   **Note: The testing scope of this panel does not include the  following reported medications:   Albuterol  Aspirin  Azelastine  Chlorthalidone  Cholecalciferol  Duloxetine (Cymbalta)  Empagliflozin (Jardiance)  Hydroxychloroquine (Plaquenil)  Hydroxyzine  Infliximab (Remicade)  Levothyroxine  Loratadine (Claritin)  Methotrexate  Metoprolol  Multivitamin  Rosuvastatin  Spironolactone  Valacyclovir (Valtrex)  Zaleplon (Sonata) ==================================================================== For clinical consultation, please call [phone]. ====================================================================      ROS  Constitutional: Denies any fever or chills Gastrointestinal: No reported hemesis, hematochezia, vomiting, or acute GI distress Musculoskeletal: Denies any acute onset joint swelling, redness, loss of ROM, or weakness Neurological: No reported episodes of acute onset apraxia, aphasia, dysarthria, agnosia, amnesia, paralysis, loss of coordination, or loss of consciousness  Medication Review  DULoxetine, Fluticasone Propionate (Inhal), Multi-Vitamins, Vitamin D, albuterol, aspirin EC, azelastine, folic acid, furosemide, hydrOXYzine, hydroxychloroquine, inFLIXimab, levothyroxine, loratadine, methotrexate, metoprolol succinate, ondansetron, oxyCODONE, rosuvastatin, spironolactone, sucralfate, and traZODone  History Review  Allergy: Laura [name] is allergic to bupropion, jardiance [empagliflozin], and penicillins. Drug: Laura [name]  reports no history of drug use. Alcohol:  reports no history of alcohol use. Tobacco:  reports that she has never smoked. She has never used smokeless tobacco. Social: Laura [name]  reports that she has never smoked. She has never used smokeless tobacco. She reports that she does not drink  alcohol and does not use drugs. Medical:  has a past medical history of Allergy, Anxiety, Arthritis, degenerative (10/08/2013), Asthma, Chronic hand pain, left (10/10/2019), Chronic kidney disease, Depression, Lumbar spinal stenosis with neurogenic claudication (11/07/2014), Major depression, single episode, in complete remission ([place]) (06/25/2015), Memory loss, short term (03/19/2014), Rheumatoid arthritis ([place]), Sciatica of left side (12/31/2019), Seizure ([place]) (10/07/2014), Sleep apnea, Sleep apnea, and Thyroid disease. Surgical: Ms. Southern  has a past surgical history that includes Abdominal hysterectomy; Cesarean section; Replacement total knee (Left); and Hip surgery (Right). Family: family history includes Alcohol abuse in her father; Depression in her father and sister; Heart attack in her father; Hypertension in her mother and sister; Post-traumatic stress disorder in her father; Rheum arthritis in her sister; Stroke in her mother.  Laboratory Chemistry Profile   Renal Lab Results  Component Value Date   BUN 34 (H) 02/15/2021   CREATININE 1.37 (H) 02/15/2021   GFRAA 57 (L) 10/03/2019   GFRNONAA 40 (L) 02/15/2021    Hepatic Lab Results  Component Value Date   AST 20 01/14/2021   ALT 11 01/14/2021   ALBUMIN 4.0 01/14/2021   ALKPHOS 55 01/14/2021   LIPASE 22 01/14/2021    Electrolytes Lab Results  Component Value Date   NA 132 (L) 02/15/2021   K 4.2 02/15/2021   CL 95 (L) 02/15/2021   CALCIUM 9.4 02/15/2021   MG 2.3 10/08/2018    Bone Lab Results  Component Value Date   25OHVITD1 36 07/15/2015   25OHVITD2 3.6 07/15/2015   25OHVITD3 32 07/15/2015    Inflammation (CRP: Acute Phase) (ESR: Chronic Phase) Lab Results  Component Value  Date   CRP 1.9 (H) 07/15/2015   ESRSEDRATE 32 (H) 07/15/2015   LATICACIDVEN 1.4 10/07/2018         Note: Above Lab results reviewed.  Recent Imaging Review  MR LUMBAR SPINE WO CONTRAST CLINICAL DATA:  Low back pain with symptoms  persisting over 6 weeks. Spinal stenosis  EXAM: MRI LUMBAR SPINE WITHOUT CONTRAST  TECHNIQUE: Multiplanar, multisequence MR imaging of the lumbar spine was performed. No intravenous contrast was administered.  COMPARISON:  12/26/2019  FINDINGS: Segmentation:  5 lumbar type vertebrae  Alignment:  Degenerative grade 1 anterolisthesis at L3-4 and L4-5  Vertebrae:  No fracture, evidence of discitis, or bone lesion.  Conus medullaris and cauda equina: Conus extends to the T12-L1 level. Conus appears normal. There is cauda equina redundancy due to the degree of severe spinal stenosis at L3-4.  Paraspinal and other soft tissues: Fatty atrophy of intrinsic back muscles.  Disc levels:  T12- L1: Unremarkable.  L1-L2: Mild disc narrowing and bulging. Mild facet spurring. Mild spinal stenosis  L2-L3: Disc narrowing and bulging with facet spurring and ligamentum flavum thickening. Moderate spinal stenosis that is similar or mildly progressed from before. Noncompressive bilateral foraminal narrowing based on sagittal images  L3-L4: Facet osteoarthritis with spurring and ligamentous thickening. Mild anterolisthesis. Circumferential disc bulging with severe spinal stenosis effacing CSF. Noncompressive bilateral foraminal narrowing  L4-L5: Disc narrowing and bulging. Advanced facet osteoarthritis with spurring and anterolisthesis. Moderate spinal stenosis. Bilateral L5 impingement at the subarticular recesses. Patent foramina  L5-S1:Disc narrowing and bulging with endplate and facet spurring. Patent canal and foramina.  IMPRESSION: 1. Advanced lumbar spine degeneration with L3-4 and L4-5 anterolisthesis. 2. L3-4 chronic, severe spinal stenosis. 3. L2-3 and L4-5 moderate to advanced spinal stenosis. Bilateral L5 impingement at the subarticular recesses of L4-5.  Electronically Signed   By: Jorje Guild M.D.   On: 04/30/2021 06:15 Note: Reviewed        Physical Exam   General appearance: Well nourished, well developed, and well hydrated. In no apparent acute distress Mental status: Alert, oriented x 3 (person, place, & time)       Respiratory: No evidence of acute respiratory distress Eyes: PERLA Vitals: There were no vitals taken for this visit. BMI: Estimated body mass index is 32.28 kg/m as calculated from the following:   Height as of 04/07/21: '5\' 6"'  (0.071 m).   Weight as of 04/07/21: 200 lb (90.7 kg). Ideal: Patient weight not recorded  Assessment   Diagnosis Status  No diagnosis found. Controlled Controlled Controlled   Updated Problems: No problems updated.  Plan of Care  Problem-specific:  No problem-specific Assessment & Plan notes found for this encounter.  Ms. Journe Hallmark has a current medication list which includes the following long-term medication(s): albuterol, azelastine, duloxetine, flovent diskus, infliximab, levothyroxine, loratadine, metoprolol succinate, oxycodone, oxycodone, oxycodone, spironolactone, sucralfate, and sucralfate.  Pharmacotherapy (Medications Ordered): No orders of the defined types were placed in this encounter.  Orders:  No orders of the defined types were placed in this encounter.  Follow-up plan:   No follow-ups on file.     Interventional Therapies  Risk  Complexity Considerations:   Estimated body mass index is 38.27 kg/m as calculated from the following:   Height as of 12/22/20: '5\' 5"'  (1.651 m).   Weight as of 12/22/20: 230 lb (104.3 kg). PLAQUENIL Anticoagulation (Stop: 11 days)   Planned  Pending:      Under consideration:   Diagnostic bilateral genicular NB  Diagnostic bilateral L3  and/or L5 TFESI   Completed:   Therapeutic right IA steroid knee injection x2 (10/15/2015)  Therapeutic right Hyalgan knee injection x1 (09/01/2016)  Therapeutic left lumbar facet block x1 (01/22/2015)  Therapeutic left IA hip injection x1 (03/17/2015)  Therapeutic left L4-5 LESI x1 (02/04/2020)  (100/100/85/85)  Therapeutic midline L2-3 LESI x3 (12/22/2020) (95/95/95/95)    Therapeutic  Palliative (PRN) options:   Therapeutic right IA Hyalgan knee injections Palliative left IA hip joint injection  Diagnostic left lumbar facet MBB #2       Recent Visits Date Type Provider Dept  04/07/21 Office Visit Milinda Pointer, MD [facility]  Showing recent visits within past 90 days and meeting all other requirements Future Appointments Date Type Provider Dept  06/21/21 Appointment Milinda Pointer, MD [facility]  Showing future appointments within next 90 days and meeting all other requirements  I discussed the assessment and treatment plan with the patient. The patient was provided an opportunity to ask questions and all were answered. The patient agreed with the plan and demonstrated an understanding of the instructions.  Patient advised to call back or seek an in-person evaluation if the symptoms or condition worsens.  Duration of encounter: *** minutes.  Note by: Gaspar Cola, MD Date: 06/21/2021; Time: 7:52 AM

## 2021-06-21 ENCOUNTER — Encounter: Payer: Self-pay | Admitting: Pain Medicine

## 2021-06-21 ENCOUNTER — Ambulatory Visit: Payer: Medicare Other | Attending: Pain Medicine | Admitting: Pain Medicine

## 2021-06-21 VITALS — BP 119/77 | HR 68 | Temp 98.4°F | Resp 18 | Ht 65.0 in | Wt 254.0 lb

## 2021-06-21 DIAGNOSIS — M159 Polyosteoarthritis, unspecified: Secondary | ICD-10-CM

## 2021-06-21 DIAGNOSIS — M25552 Pain in left hip: Secondary | ICD-10-CM | POA: Diagnosis present

## 2021-06-21 DIAGNOSIS — M5441 Lumbago with sciatica, right side: Secondary | ICD-10-CM

## 2021-06-21 DIAGNOSIS — M5137 Other intervertebral disc degeneration, lumbosacral region: Secondary | ICD-10-CM | POA: Diagnosis present

## 2021-06-21 DIAGNOSIS — G894 Chronic pain syndrome: Secondary | ICD-10-CM | POA: Diagnosis present

## 2021-06-21 DIAGNOSIS — M51379 Other intervertebral disc degeneration, lumbosacral region without mention of lumbar back pain or lower extremity pain: Secondary | ICD-10-CM

## 2021-06-21 DIAGNOSIS — Z79899 Other long term (current) drug therapy: Secondary | ICD-10-CM | POA: Diagnosis present

## 2021-06-21 DIAGNOSIS — M79604 Pain in right leg: Secondary | ICD-10-CM

## 2021-06-21 DIAGNOSIS — R937 Abnormal findings on diagnostic imaging of other parts of musculoskeletal system: Secondary | ICD-10-CM

## 2021-06-21 DIAGNOSIS — Z79891 Long term (current) use of opiate analgesic: Secondary | ICD-10-CM | POA: Diagnosis present

## 2021-06-21 DIAGNOSIS — M5442 Lumbago with sciatica, left side: Secondary | ICD-10-CM | POA: Diagnosis present

## 2021-06-21 DIAGNOSIS — I2510 Atherosclerotic heart disease of native coronary artery without angina pectoris: Secondary | ICD-10-CM

## 2021-06-21 DIAGNOSIS — G8929 Other chronic pain: Secondary | ICD-10-CM

## 2021-06-21 DIAGNOSIS — M797 Fibromyalgia: Secondary | ICD-10-CM | POA: Diagnosis present

## 2021-06-21 DIAGNOSIS — M79605 Pain in left leg: Secondary | ICD-10-CM

## 2021-06-21 DIAGNOSIS — M25561 Pain in right knee: Secondary | ICD-10-CM

## 2021-06-21 MED ORDER — OXYCODONE HCL 5 MG PO TABS: 5.0000 mg | ORAL_TABLET | Freq: Four times a day (QID) | ORAL | 0 refills | Status: DC | PRN

## 2021-06-21 NOTE — Patient Instructions (Signed)
____________________________________________________________________________________________  Medication Rules  Purpose: To inform patients, and their family members, of our rules and regulations.  Applies to: All patients receiving prescriptions (written or electronic).  Pharmacy of record: Pharmacy where electronic prescriptions will be sent. If written prescriptions are taken to a different pharmacy, please inform the nursing staff. The pharmacy listed in the electronic medical record should be the one where you would like electronic prescriptions to be sent.  Electronic prescriptions: In compliance with the [place] Strengthen Opioid Misuse Prevention (STOP) Act of 2017 (Session Law 2017-74/H243), effective January 10, 2018, all controlled substances must be electronically prescribed. Calling prescriptions to the pharmacy will cease to exist.  Prescription refills: Only during scheduled appointments. Applies to all prescriptions.  NOTE: The following applies primarily to controlled substances (Opioid* Pain Medications).   Type of encounter (visit): For patients receiving controlled substances, face-to-face visits are required. (Not an option or up to the patient.)  Patient's responsibilities: Pain Pills: Bring all pain pills to every appointment (except for procedure appointments). Pill Bottles: Bring pills in original pharmacy bottle. Always bring the newest bottle. Bring bottle, even if empty. Medication refills: You are responsible for knowing and keeping track of what medications you take and those you need refilled. The day before your appointment: write a list of all prescriptions that need to be refilled. The day of the appointment: give the list to the admitting nurse. Prescriptions will be written only during appointments. No prescriptions will be written on procedure days. If you forget a medication: it will not be "Called in", "Faxed", or "electronically sent". You will  need to get another appointment to get these prescribed. No early refills. Do not call asking to have your prescription filled early. Prescription Accuracy: You are responsible for carefully inspecting your prescriptions before leaving our office. Have the discharge nurse carefully go over each prescription with you, before taking them home. Make sure that your name is accurately spelled, that your address is correct. Check the name and dose of your medication to make sure it is accurate. Check the number of pills, and the written instructions to make sure they are clear and accurate. Make sure that you are given enough medication to last until your next medication refill appointment. Taking Medication: Take medication as prescribed. When it comes to controlled substances, taking less pills or less frequently than prescribed is permitted and encouraged. Never take more pills than instructed. Never take medication more frequently than prescribed.  Inform other Doctors: Always inform, all of your healthcare providers, of all the medications you take. Pain Medication from other Providers: You are not allowed to accept any additional pain medication from any other Doctor or Healthcare provider. There are two exceptions to this rule. (see below) In the event that you require additional pain medication, you are responsible for notifying us, as stated below. Cough Medicine: Often these contain an opioid, such as codeine or hydrocodone. Never accept or take cough medicine containing these opioids if you are already taking an opioid* medication. The combination may cause respiratory failure and death. Medication Agreement: You are responsible for carefully reading and following our Medication Agreement. This must be signed before receiving any prescriptions from our practice. Safely store a copy of your signed Agreement. Violations to the Agreement will result in no further prescriptions. (Additional copies of our  Medication Agreement are available upon request.) Laws, Rules, & Regulations: All patients are expected to follow all Federal and State Laws, Statutes, Rules, & Regulations. Ignorance of   the Laws does not constitute a valid excuse.  Illegal drugs and Controlled Substances: The use of illegal substances (including, but not limited to marijuana and its derivatives) and/or the illegal use of any controlled substances is strictly prohibited. Violation of this rule may result in the immediate and permanent discontinuation of any and all prescriptions being written by our practice. The use of any illegal substances is prohibited. Adopted CDC guidelines & recommendations: Target dosing levels will be at or below 60 MME/day. Use of benzodiazepines** is not recommended.  Exceptions: There are only two exceptions to the rule of not receiving pain medications from other Healthcare Providers. Exception #1 (Emergencies): In the event of an emergency (i.e.: accident requiring emergency care), you are allowed to receive additional pain medication. However, you are responsible for: As soon as you are able, call our office [phone], at any time of the day or night, and leave a message stating your name, the date and nature of the emergency, and the name and dose of the medication prescribed. In the event that your call is answered by a member of our staff, make sure to document and save the date, time, and the name of the person that took your information.  Exception #2 (Planned Surgery): In the event that you are scheduled by another doctor or dentist to have any type of surgery or procedure, you are allowed (for a period no longer than 30 days), to receive additional pain medication, for the acute post-op pain. However, in this case, you are responsible for picking up a copy of our "Post-op Pain Management for Surgeons" handout, and giving it to your surgeon or dentist. This document is available at our office, and  does not require an appointment to obtain it. Simply go to our office during business hours (Monday-Thursday from 8:00 AM to 4:00 PM) (Friday 8:00 AM to 12:00 Noon) or if you have a scheduled appointment with us, prior to your surgery, and ask for it by name. In addition, you are responsible for: calling our office [phone], at any time of the day or night, and leaving a message stating your name, name of your surgeon, type of surgery, and date of procedure or surgery. Failure to comply with your responsibilities may result in termination of therapy involving the controlled substances. Medication Agreement Violation. Following the above rules, including your responsibilities will help you in avoiding a Medication Agreement Violation ("Breaking your Pain Medication Contract").  *Opioid medications include: morphine, codeine, oxycodone, oxymorphone, hydrocodone, hydromorphone, meperidine, tramadol, tapentadol, buprenorphine, fentanyl, methadone. **Benzodiazepine medications include: diazepam (Valium), alprazolam (Xanax), clonazepam (Klonopine), lorazepam (Ativan), clorazepate (Tranxene), chlordiazepoxide (Librium), estazolam (Prosom), oxazepam (Serax), temazepam (Restoril), triazolam (Halcion) (Last updated: 10/07/2020) ____________________________________________________________________________________________  ____________________________________________________________________________________________  Medication Recommendations and Reminders  Applies to: All patients receiving prescriptions (written and/or electronic).  Medication Rules & Regulations: These rules and regulations exist for your safety and that of others. They are not flexible and neither are we. Dismissing or ignoring them will be considered "non-compliance" with medication therapy, resulting in complete and irreversible termination of such therapy. (See document titled "Medication Rules" for more details.) In all conscience,  because of safety reasons, we cannot continue providing a therapy where the patient does not follow instructions.  Pharmacy of record:  Definition: This is the pharmacy where your electronic prescriptions will be sent.  We do not endorse any particular pharmacy, however, we have experienced problems with [facility] not securing enough medication supply for the community. We do not restrict you   in your choice of pharmacy. However, once we write for your prescriptions, we will NOT be re-sending more prescriptions to fix restricted supply problems created by your pharmacy, or your insurance.  The pharmacy listed in the electronic medical record should be the one where you want electronic prescriptions to be sent. If you choose to change pharmacy, simply notify our nursing staff.  Recommendations: Keep all of your pain medications in a safe place, under lock and key, even if you live alone. We will NOT replace lost, stolen, or damaged medication. After you fill your prescription, take 1 week's worth of pills and put them away in a safe place. You should keep a separate, properly labeled bottle for this purpose. The remainder should be kept in the original bottle. Use this as your primary supply, until it runs out. Once it's gone, then you know that you have 1 week's worth of medicine, and it is time to come in for a prescription refill. If you do this correctly, it is unlikely that you will ever run out of medicine. To make sure that the above recommendation works, it is very important that you make sure your medication refill appointments are scheduled at least 1 week before you run out of medicine. To do this in an effective manner, make sure that you do not leave the office without scheduling your next medication management appointment. Always ask the nursing staff to show you in your prescription , when your medication will be running out. Then arrange for the receptionist to get you a return appointment,  at least 7 days before you run out of medicine. Do not wait until you have 1 or 2 pills left, to come in. This is very poor planning and does not take into consideration that we may need to cancel appointments due to bad weather, sickness, or emergencies affecting our staff. DO NOT ACCEPT A "Partial Fill": If for any reason your pharmacy does not have enough pills/tablets to completely fill or refill your prescription, do not allow for a "partial fill". The law allows the pharmacy to complete that prescription within 72 hours, without requiring a new prescription. If they do not fill the rest of your prescription within those 72 hours, you will need a separate prescription to fill the remaining amount, which we will NOT provide. If the reason for the partial fill is your insurance, you will need to talk to the pharmacist about payment alternatives for the remaining tablets, but again, DO NOT ACCEPT A PARTIAL FILL, unless you can trust your pharmacist to obtain the remainder of the pills within 72 hours.  Prescription refills and/or changes in medication(s):  Prescription refills, and/or changes in dose or medication, will be conducted only during scheduled medication management appointments. (Applies to both, written and electronic prescriptions.) No refills on procedure days. No medication will be changed or started on procedure days. No changes, adjustments, and/or refills will be conducted on a procedure day. Doing so will interfere with the diagnostic portion of the procedure. No phone refills. No medications will be "called into the pharmacy". No Fax refills. No weekend refills. No Holliday refills. No after hours refills.  Remember:  Business hours are:  Monday to Thursday 8:00 AM to 4:00 PM Provider's Schedule: Tae Robak, MD - Appointments are:  Medication management: Monday and Wednesday 8:00 AM to 4:00 PM Procedure day: Tuesday and Thursday 7:30 AM to 4:00 PM Bilal Lateef, MD -  Appointments are:  Medication management: Tuesday and Thursday 8:00   AM to 4:00 PM Procedure day: Monday and Wednesday 7:30 AM to 4:00 PM (Last update: 07/31/2019) ____________________________________________________________________________________________  ____________________________________________________________________________________________  CBD (cannabidiol) & Delta-8 (Delta-8 tetrahydrocannabinol) WARNING  Intro: Cannabidiol (CBD) and tetrahydrocannabinol (THC), are two natural compounds found in plants of the Cannabis genus. They can both be extracted from hemp or cannabis. Hemp and cannabis come from the Cannabis sativa plant. Both compounds interact with your body's endocannabinoid system, but they have very different effects. CBD does not produce the high sensation associated with cannabis. Delta-8 tetrahydrocannabinol, also known as delta-8 THC, is a psychoactive substance found in the Cannabis sativa plant, of which marijuana and hemp are two varieties. THC is responsible for the high associated with the illicit use of marijuana.  Applicable to: All individuals currently taking or considering taking CBD (cannabidiol) and, more important, all patients taking opioid analgesic controlled substances (pain medication). (Example: oxycodone; oxymorphone; hydrocodone; hydromorphone; morphine; methadone; tramadol; tapentadol; fentanyl; buprenorphine; butorphanol; dextromethorphan; meperidine; codeine; etc.)  Legal status: CBD remains a Schedule I drug prohibited for any use. CBD is illegal with one exception. In the [place], CBD has a limited Food and Drug Administration (FDA) approval for the treatment of two specific types of epilepsy disorders. Only one CBD product has been approved by the FDA for this purpose: "Epidiolex". FDA is aware that some companies are marketing products containing cannabis and cannabis-derived compounds in ways that violate the Federal Food, Drug and Cosmetic Act  (FD&C Act) and that may put the health and safety of consumers at risk. The FDA, a Federal agency, has not enforced the CBD status since 2018. UPDATE: (02/26/2021) The Drug Enforcement Agency (DEA) issued a letter stating that "delta" cannabinoids, including Delta-8-THCO and Delta-9-THCO, synthetically derived from hemp do not qualify as hemp and will be viewed as Schedule I drugs. (Schedule I drugs, substances, or chemicals are defined as drugs with no currently accepted medical use and a high potential for abuse. Some examples of Schedule I drugs are: heroin, lysergic acid diethylamide (LSD), marijuana (cannabis), 3,4-methylenedioxymethamphetamine (ecstasy), methaqualone, and peyote.) (https://www.dea.gov)  Legality: Some manufacturers ship CBD products nationally, which is illegal. Often such products are sold online and are therefore available throughout the country. CBD is openly sold in head shops and health food stores in some states where such sales have not been explicitly legalized. Selling unapproved products with unsubstantiated therapeutic claims is not only a violation of the law, but also can put patients at risk, as these products have not been proven to be safe or effective. Federal illegality makes it difficult to conduct research on CBD.  Reference: "FDA Regulation of Cannabis and Cannabis-Derived Products, Including Cannabidiol (CBD)" - https://www.fda.gov/news-events/public-health-focus/fda-regulation-cannabis-and-cannabis-derived-products-including-cannabidiol-cbd  Warning: CBD is not FDA approved and has not undergo the same manufacturing controls as prescription drugs.  This means that the purity and safety of available CBD may be questionable. Most of the time, despite manufacturer's claims, it is contaminated with THC (delta-9-tetrahydrocannabinol - the chemical in marijuana responsible for the "HIGH").  When this is the case, the THC contaminant will trigger a positive urine drug  screen (UDS) test for Marijuana (carboxy-THC). Because a positive UDS for any illicit substance is a violation of our medication agreement, your opioid analgesics (pain medicine) may be permanently discontinued. The FDA recently put out a warning about 5 things that everyone should be aware of regarding Delta-8 THC: Delta-8 THC products have not been evaluated or approved by the FDA for safe use and may be marketed in ways that put the   public health at risk. The FDA has received adverse event reports involving delta-8 THC-containing products. Delta-8 THC has psychoactive and intoxicating effects. Delta-8 THC manufacturing often involve use of potentially harmful chemicals to create the concentrations of delta-8 THC claimed in the marketplace. The final delta-8 THC product may have potentially harmful by-products (contaminants) due to the chemicals used in the process. Manufacturing of delta-8 THC products may occur in uncontrolled or unsanitary settings, which may lead to the presence of unsafe contaminants or other potentially harmful substances. Delta-8 THC products should be kept out of the reach of children and pets.  MORE ABOUT CBD  General Information: CBD was discovered in 1940 and it is a derivative of the cannabis sativa genus plants (Marijuana and Hemp). It is one of the 113 identified substances found in Marijuana. It accounts for up to 40% of the plant's extract. As of 2018, preliminary clinical studies on CBD included research for the treatment of anxiety, movement disorders, and pain. CBD is available and consumed in multiple forms, including inhalation of smoke or vapor, as an aerosol spray, and by mouth. It may be supplied as an oil containing CBD, capsules, dried cannabis, or as a liquid solution. CBD is thought not to be as psychoactive as THC (delta-9-tetrahydrocannabinol - the chemical in marijuana responsible for the "HIGH"). Studies suggest that CBD may interact with different  biological target receptors in the body, including cannabinoid and other neurotransmitter receptors. As of 2018 the mechanism of action for its biological effects has not been determined.  Side-effects  Adverse reactions: Dry mouth, diarrhea, decreased appetite, fatigue, drowsiness, malaise, weakness, sleep disturbances, and others.  Drug interactions: CBC may interact with other medications such as blood-thinners. Because CBD causes drowsiness on its own, it also increases the drowsiness caused by other medications, including antihistamines (such as Benadryl), benzodiazepines (Xanax, Ativan, Valium), antipsychotics, antidepressants and opioids, as well as alcohol and supplements such as kava, melatonin and St. John's Wort. Be cautious with the following combinations:   Brivaracetam (Briviact) Brivaracetam is changed and broken down by the body. CBD might decrease how quickly the body breaks down brivaracetam. This might increase levels of brivaracetam in the body.  Caffeine Caffeine is changed and broken down by the body. CBD might decrease how quickly the body breaks down caffeine. This might increase levels of caffeine in the body.  Carbamazepine (Tegretol) Carbamazepine is changed and broken down by the body. CBD might decrease how quickly the body breaks down carbamazepine. This might increase levels of carbamazepine in the body and increase its side effects.  Citalopram (Celexa) Citalopram is changed and broken down by the body. CBD might decrease how quickly the body breaks down citalopram. This might increase levels of citalopram in the body and increase its side effects.  Clobazam (Onfi) Clobazam is changed and broken down by the liver. CBD might decrease how quickly the liver breaks down clobazam. This might increase the effects and side effects of clobazam.  Eslicarbazepine (Aptiom) Eslicarbazepine is changed and broken down by the body. CBD might decrease how quickly the body  breaks down eslicarbazepine. This might increase levels of eslicarbazepine in the body by a small amount.  Everolimus (Zostress) Everolimus is changed and broken down by the body. CBD might decrease how quickly the body breaks down everolimus. This might increase levels of everolimus in the body.  Lithium Taking higher doses of CBD might increase levels of lithium. This can increase the risk of lithium toxicity.  Medications changed by the liver (  Cytochrome P450 1A1 (CYP1A1) substrates) Some medications are changed and broken down by the liver. CBD might change how quickly the liver breaks down these medications. This could change the effects and side effects of these medications.  Medications changed by the liver (Cytochrome P450 1A2 (CYP1A2) substrates) Some medications are changed and broken down by the liver. CBD might change how quickly the liver breaks down these medications. This could change the effects and side effects of these medications.  Medications changed by the liver (Cytochrome P450 1B1 (CYP1B1) substrates) Some medications are changed and broken down by the liver. CBD might change how quickly the liver breaks down these medications. This could change the effects and side effects of these medications.  Medications changed by the liver (Cytochrome P450 2A6 (CYP2A6) substrates) Some medications are changed and broken down by the liver. CBD might change how quickly the liver breaks down these medications. This could change the effects and side effects of these medications.  Medications changed by the liver (Cytochrome P450 2B6 (CYP2B6) substrates) Some medications are changed and broken down by the liver. CBD might change how quickly the liver breaks down these medications. This could change the effects and side effects of these medications.  Medications changed by the liver (Cytochrome P450 2C19 (CYP2C19) substrates) Some medications are changed and broken down by the liver.  CBD might change how quickly the liver breaks down these medications. This could change the effects and side effects of these medications.  Medications changed by the liver (Cytochrome P450 2C8 (CYP2C8) substrates) Some medications are changed and broken down by the liver. CBD might change how quickly the liver breaks down these medications. This could change the effects and side effects of these medications.  Medications changed by the liver (Cytochrome P450 2C9 (CYP2C9) substrates) Some medications are changed and broken down by the liver. CBD might change how quickly the liver breaks down these medications. This could change the effects and side effects of these medications.  Medications changed by the liver (Cytochrome P450 2D6 (CYP2D6) substrates) Some medications are changed and broken down by the liver. CBD might change how quickly the liver breaks down these medications. This could change the effects and side effects of these medications.  Medications changed by the liver (Cytochrome P450 2E1 (CYP2E1) substrates) Some medications are changed and broken down by the liver. CBD might change how quickly the liver breaks down these medications. This could change the effects and side effects of these medications.  Medications changed by the liver (Cytochrome P450 3A4 (CYP3A4) substrates) Some medications are changed and broken down by the liver. CBD might change how quickly the liver breaks down these medications. This could change the effects and side effects of these medications.  Medications changed by the liver (Glucuronidated drugs) Some medications are changed and broken down by the liver. CBD might change how quickly the liver breaks down these medications. This could change the effects and side effects of these medications.  Medications that decrease the breakdown of other medications by the liver (Cytochrome P450 2C19 (CYP2C19) inhibitors) CBD is changed and broken down by the liver.  Some drugs decrease how quickly the liver changes and breaks down CBD. This could change the effects and side effects of CBD.  Medications that decrease the breakdown of other medications in the liver (Cytochrome P450 3A4 (CYP3A4) inhibitors) CBD is changed and broken down by the liver. Some drugs decrease how quickly the liver changes and breaks down CBD. This could change the effects   and side effects of CBD.  Medications that increase breakdown of other medications by the liver (Cytochrome P450 3A4 (CYP3A4) inducers) CBD is changed and broken down by the liver. Some drugs increase how quickly the liver changes and breaks down CBD. This could change the effects and side effects of CBD.  Medications that increase the breakdown of other medications by the liver (Cytochrome P450 2C19 (CYP2C19) inducers) CBD is changed and broken down by the liver. Some drugs increase how quickly the liver changes and breaks down CBD. This could change the effects and side effects of CBD.  Methadone (Dolophine) Methadone is broken down by the liver. CBD might decrease how quickly the liver breaks down methadone. Taking cannabidiol along with methadone might increase the effects and side effects of methadone.  Rufinamide (Banzel) Rufinamide is changed and broken down by the body. CBD might decrease how quickly the body breaks down rufinamide. This might increase levels of rufinamide in the body by a small amount.  Sedative medications (CNS depressants) CBD might cause sleepiness and slowed breathing. Some medications, called sedatives, can also cause sleepiness and slowed breathing. Taking CBD with sedative medications might cause breathing problems and/or too much sleepiness.  Sirolimus (Rapamune) Sirolimus is changed and broken down by the body. CBD might decrease how quickly the body breaks down sirolimus. This might increase levels of sirolimus in the body.  Stiripentol (Diacomit) Stiripentol is changed and  broken down by the body. CBD might decrease how quickly the body breaks down stiripentol. This might increase levels of stiripentol in the body and increase its side effects.  Tacrolimus (Prograf) Tacrolimus is changed and broken down by the body. CBD might decrease how quickly the body breaks down tacrolimus. This might increase levels of tacrolimus in the body.  Tamoxifen (Soltamox) Tamoxifen is changed and broken down by the body. CBD might affect how quickly the body breaks down tamoxifen. This might affect levels of tamoxifen in the body.  Topiramate (Topamax) Topiramate is changed and broken down by the body. CBD might decrease how quickly the body breaks down topiramate. This might increase levels of topiramate in the body by a small amount.  Valproate Valproic acid can cause liver injury. Taking cannabidiol with valproic acid might increase the chance of liver injury. CBD and/or valproic acid might need to be stopped, or the dose might need to be reduced.  Warfarin (Coumadin) CBD might increase levels of warfarin, which can increase the risk for bleeding. CBD and/or warfarin might need to be stopped, or the dose might need to be reduced.  Zonisamide Zonisamide is changed and broken down by the body. CBD might decrease how quickly the body breaks down zonisamide. This might increase levels of zonisamide in the body by a small amount. (Last update: 03/10/2021) ____________________________________________________________________________________________  ____________________________________________________________________________________________  Drug Holidays (Slow)  What is a "Drug Holiday"? Drug Holiday: is the name given to the period of time during which a patient stops taking a medication(s) for the purpose of eliminating tolerance to the drug.  Benefits Improved effectiveness of opioids. Decreased opioid dose needed to achieve benefits. Improved pain with lesser  dose.  What is tolerance? Tolerance: is the progressive decreased in effectiveness of a drug due to its repetitive use. With repetitive use, the body gets use to the medication and as a consequence, it loses its effectiveness. This is a common problem seen with opioid pain medications. As a result, a larger dose of the drug is needed to achieve the same effect that   used to be obtained with a smaller dose.  How long should a "Drug Holiday" last? You should stay off of the pain medicine for at least 14 consecutive days. (2 weeks)  Should I stop the medicine "cold turkey"? No. You should always coordinate with your Pain Specialist so that he/she can provide you with the correct medication dose to make the transition as smoothly as possible.  How do I stop the medicine? Slowly. You will be instructed to decrease the daily amount of pills that you take by one (1) pill every seven (7) days. This is called a "slow downward taper" of your dose. For example: if you normally take four (4) pills per day, you will be asked to drop this dose to three (3) pills per day for seven (7) days, then to two (2) pills per day for seven (7) days, then to one (1) per day for seven (7) days, and at the end of those last seven (7) days, this is when the "Drug Holiday" would start.   Will I have withdrawals? By doing a "slow downward taper" like this one, it is unlikely that you will experience any significant withdrawal symptoms. Typically, what triggers withdrawals is the sudden stop of a high dose opioid therapy. Withdrawals can usually be avoided by slowly decreasing the dose over a prolonged period of time. If you do not follow these instructions and decide to stop your medication abruptly, withdrawals may be possible.  What are withdrawals? Withdrawals: refers to the wide range of symptoms that occur after stopping or dramatically reducing opiate drugs after heavy and prolonged use. Withdrawal symptoms do not occur to  patients that use low dose opioids, or those who take the medication sporadically. Contrary to benzodiazepine (example: Valium, Xanax, etc.) or alcohol withdrawals ("Delirium Tremens"), opioid withdrawals are not lethal. Withdrawals are the physical manifestation of the body getting rid of the excess receptors.  Expected Symptoms Early symptoms of withdrawal may include: Agitation Anxiety Muscle aches Increased tearing Insomnia Runny nose Sweating Yawning  Late symptoms of withdrawal may include: Abdominal cramping Diarrhea Dilated pupils Goose bumps Nausea Vomiting  Will I experience withdrawals? Due to the slow nature of the taper, it is very unlikely that you will experience any.  What is a slow taper? Taper: refers to the gradual decrease in dose.  (Last update: 07/31/2019) ____________________________________________________________________________________________    

## 2021-06-21 NOTE — Progress Notes (Signed)
Nursing Pain Medication Assessment:  Safety precautions to be maintained throughout the outpatient stay will include: orient to surroundings, keep bed in low position, maintain call bell within reach at all times, provide assistance with transfer out of bed and ambulation.  Medication Inspection Compliance: Pill count conducted under aseptic conditions, in front of the patient. Neither the pills nor the bottle was removed from the patient's sight at any time. Once count was completed pills were immediately returned to the patient in their original bottle.  Medication: Oxycodone IR Pill/Patch Count:  0 of 120 pills remain Pill/Patch Appearance: Markings consistent with prescribed medication Bottle Appearance: Standard pharmacy container. Clearly labeled. Filled Date: 02 / 28 / 2023 Last Medication intake:  Today

## 2021-06-22 ENCOUNTER — Telehealth: Payer: Self-pay | Admitting: Pain Medicine

## 2021-06-22 ENCOUNTER — Telehealth: Payer: Self-pay

## 2021-06-22 NOTE — Telephone Encounter (Signed)
Received a call from pharmacy and they state that the patient is out of her medications and is irate because they will not fill them today.  She had a [facility] filled at [facility] and could not explain why she was out of medications.  Patient and her husband both yelled and showed out at the pharmacy and the Pharmacist Wells Guiles states that she will not be filling any medications for this patient now or in the future.  Dr Dossie Arbour notified.  Will call the patient and inform her that she no longer has prescriptions available.

## 2021-06-22 NOTE — Telephone Encounter (Signed)
Please call Wells Guiles at [facility] [phone]. States patient came in to get script filled for 07-07-21 and got very irate and out of hand with them. Says she only has 5 pills left that was just filled on 06-08-21.

## 2021-06-22 NOTE — Telephone Encounter (Signed)
Attempted to call for pre appointment review of allergies/meds. Message left. 

## 2021-06-22 NOTE — Telephone Encounter (Signed)
Spoke with patients daughter Marisue Ivan and she states that she has 51 pills and the patient is saying she has 7 pills left. Explained to daughter that [facility] Hawks has discontinued her prescriptions that were sent.  Daughter states she will call the patient and talk with her and she will call to inform us which pharmacy they would like the prescriptions sent to.

## 2021-06-23 ENCOUNTER — Telehealth: Payer: Self-pay

## 2021-06-23 DIAGNOSIS — F3342 Major depressive disorder, recurrent, in full remission: Secondary | ICD-10-CM

## 2021-06-23 MED ORDER — DULOXETINE HCL 60 MG PO CPEP: ORAL_CAPSULE | ORAL | 0 refills | Status: DC

## 2021-06-23 NOTE — Telephone Encounter (Signed)
I have sent a 30-day supply of medication-duloxetine to pharmacy[facility].

## 2021-06-23 NOTE — Telephone Encounter (Signed)
Please ask to schedule appointment since I have not seen patient in 6 months or more. Will provide refills to last once appointment scheduled.

## 2021-06-23 NOTE — Telephone Encounter (Signed)
pt left a message that she still in a wheel chair and she is going to be out of the duloxetine and needs a refill

## 2021-06-23 NOTE — Telephone Encounter (Signed)
made appt for 07-20-21 can she get enough medication to get to appt.

## 2021-06-26 LAB — TOXASSURE SELECT 13 (MW), URINE

## 2021-07-14 ENCOUNTER — Telehealth: Payer: Self-pay | Admitting: Pain Medicine

## 2021-07-14 ENCOUNTER — Other Ambulatory Visit: Payer: Self-pay

## 2021-07-14 DIAGNOSIS — M159 Polyosteoarthritis, unspecified: Secondary | ICD-10-CM

## 2021-07-14 DIAGNOSIS — G894 Chronic pain syndrome: Secondary | ICD-10-CM

## 2021-07-14 DIAGNOSIS — M5137 Other intervertebral disc degeneration, lumbosacral region: Secondary | ICD-10-CM

## 2021-07-14 DIAGNOSIS — M797 Fibromyalgia: Secondary | ICD-10-CM

## 2021-07-14 DIAGNOSIS — Z79899 Other long term (current) drug therapy: Secondary | ICD-10-CM

## 2021-07-14 DIAGNOSIS — G8929 Other chronic pain: Secondary | ICD-10-CM

## 2021-07-14 DIAGNOSIS — Z79891 Long term (current) use of opiate analgesic: Secondary | ICD-10-CM

## 2021-07-14 MED ORDER — OXYCODONE HCL 5 MG PO TABS: 5.0000 mg | ORAL_TABLET | Freq: Four times a day (QID) | ORAL | 0 refills | Status: DC | PRN

## 2021-07-14 NOTE — Telephone Encounter (Signed)
Called and spoke with patient daughter as requested.  She states that she would like prescript [facility]  and not [facility]. Informed daughter of nationwide shortage of medication and instructed her to [facility] before he would send med into  and if they do not have it she would need to call [facility] to see if they have it. Daughter with understanding and will call pharm and will call us back

## 2021-07-14 NOTE — Telephone Encounter (Signed)
Patients daughter called stating the regular pharmacy does not have her pain medications. [facility] [address] [facility] to there so she can have medications.  [phone]

## 2021-07-14 NOTE — Telephone Encounter (Signed)
Sent to dr Laban Emperor to refill

## 2021-07-14 NOTE — Telephone Encounter (Signed)
Patient daughter called to she if mom [facility]s [facility] [address] in [address]. Please give daughter a call [phone] Thanks

## 2021-07-16 ENCOUNTER — Other Ambulatory Visit: Payer: Self-pay | Admitting: Psychiatry

## 2021-07-16 DIAGNOSIS — F5101 Primary insomnia: Secondary | ICD-10-CM

## 2021-07-17 ENCOUNTER — Other Ambulatory Visit: Payer: Self-pay | Admitting: Psychiatry

## 2021-07-17 DIAGNOSIS — F3342 Major depressive disorder, recurrent, in full remission: Secondary | ICD-10-CM

## 2021-07-20 ENCOUNTER — Telehealth (INDEPENDENT_AMBULATORY_CARE_PROVIDER_SITE_OTHER): Payer: Medicare Other | Admitting: Psychiatry

## 2021-07-20 ENCOUNTER — Encounter: Payer: Self-pay | Admitting: Psychiatry

## 2021-07-20 DIAGNOSIS — F3342 Major depressive disorder, recurrent, in full remission: Secondary | ICD-10-CM

## 2021-07-20 DIAGNOSIS — F5101 Primary insomnia: Secondary | ICD-10-CM

## 2021-07-20 MED ORDER — DULOXETINE HCL 60 MG PO CPEP: ORAL_CAPSULE | ORAL | 1 refills | Status: DC

## 2021-07-20 MED ORDER — HYDROXYZINE PAMOATE 25 MG PO CAPS: 75.0000 mg | ORAL_CAPSULE | Freq: Every evening | ORAL | 3 refills | Status: DC | PRN

## 2021-07-20 NOTE — Progress Notes (Unsigned)
Virtual Visit via Telephone Note  I connected with Laura [name] on 07/20/21 at 11:30 AM EDT by telephone and verified that I am speaking with the correct person using two identifiers.  Location Provider Location : [facility] Patient Location : Home  Participants: Patient , Provider    I discussed the limitations, risks, security and privacy concerns of performing an evaluation and management service by telephone and the availability of in person appointments. I also discussed with the patient that there may be a patient responsible charge related to this service. The patient expressed understanding and agreed to proceed.   I discussed the assessment and treatment plan with the patient. The patient was provided an opportunity to ask questions and all were answered. The patient agreed with the plan and demonstrated an understanding of the instructions.   The patient was advised to call back or seek an in-person evaluation if the symptoms worsen or if the condition fails to improve as anticipated.  BH MD OP Progress Note  07/20/2021 12:05 PM Laura [name]  MRN:  [id number]  Chief Complaint:  Chief Complaint  Patient presents with   Follow-up: 76 year old Caucasian female who has a history of primary insomnia, depression, multiple medical problems presented for medication management.   HPI: Laura [name] is a 76 year old Caucasian female who has a history of primary insomnia, chronic pain, lumbar spinal stenosis with neurogenic claudication, rheumatoid arthritis, history of total knee replacement left, fibromyalgia, osteoarthritis of bilateral hips, left foot drop, thyroid disease, multiple other medical problems was evaluated by phone today.  Patient's last appointment was on 12/31/2020.  Patient today reports she continues to struggle with her pain, physical restrictions due to her multiple medical problems inability to ambulate currently in a wheelchair.  That does make her hopeless  at times although she currently denies any suicidality.  Patient reports her current dosage of Cymbalta 60 mg is beneficial.  She has been compliant.  Does report she struggles with sleep , her primary care provider was able to add doxepin recently.  That seems to be helpful.  Does have pain which does have an impact on sleep at times.  Patient denies any suicidality, homicidality or perceptual disturbances.  Patient denies any other concerns today.  Visit Diagnosis:    ICD-10-CM   1. MDD (major depressive disorder), recurrent, in full remission (HCC)  F33.42 DULoxetine (CYMBALTA) 60 MG capsule    2. Primary insomnia  F51.01 hydrOXYzine (VISTARIL) 25 MG capsule      Past Psychiatric History: Reviewed past psychiatric history from progress note on 06/13/2018.  Past Medical History:  Past Medical History:  Diagnosis Date   Allergy    Anxiety    Arthritis, degenerative 10/08/2013   Overview:    a.  Lumbar spine/spinal stenosis/foot drop.   b.  Hands.    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   Memory loss, short term 03/19/2014   Rheumatoid arthritis (HCC)    Sciatica of left side 12/31/2019   Seizure (HCC) 10/07/2014   Sleep apnea    Sleep apnea    Thyroid disease     Past Surgical History:  Procedure Laterality Date   ABDOMINAL HYSTERECTOMY     CESAREAN SECTION     HIP SURGERY Right    REPLACEMENT TOTAL KNEE Left  Family Psychiatric History: Reviewed family psychiatric history from progress note on 06/13/2018.  Family History:  Family History  Problem Relation Age of Onset   Hypertension Mother    Stroke Mother    Heart attack Father    Alcohol abuse Father    Depression Father    Post-traumatic stress disorder Father    Rheum arthritis Sister    Hypertension Sister     Depression Sister    Breast cancer Neg Hx     Social History: Reviewed social history from progress note on 06/13/2018. Social History   Socioeconomic History   Marital status: Married    Spouse name: Not on file   Number of children: Not on file   Years of education: Not on file   Highest education level: Not on file  Occupational History    Comment: retired  Tobacco Use   Smoking status: Never   Smokeless tobacco: Never  Vaping Use   Vaping Use: Never used  Substance and Sexual Activity   Alcohol use: No    Alcohol/week: 0.0 standard drinks of alcohol   Drug use: No   Sexual activity: Not Currently  Other Topics Concern   Not on file  Social History Narrative   Not on file   Social Determinants of Health   Financial Resource Strain: Low Risk  (11/15/2016)   Overall Financial Resource Strain (CARDIA)    Difficulty of Paying Living Expenses: Not hard at all  Food Insecurity: No Food Insecurity (11/15/2016)   Hunger Vital Sign    Worried About Running Out of Food in the Last Year: Never true    Ran Out of Food in the Last Year: Never true  Transportation Needs: No Transportation Needs (11/15/2016)   PRAPARE - Administrator, Civil Service (Medical): No    Lack of Transportation (Non-Medical): No  Physical Activity: Inactive (11/15/2016)   Exercise Vital Sign    Days of Exercise per Week: 0 days    Minutes of Exercise per Session: 0 min  Stress: No Stress Concern Present (11/15/2016)   [employer] of Occupational Health - Occupational Stress Questionnaire    Feeling of Stress : Not at all  Social Connections: Somewhat Isolated (11/15/2016)   Social Connection and Isolation Panel [NHANES]    Frequency of Communication with Friends and Family: More than three times a week    Frequency of Social Gatherings with Friends and Family: Once a week    Attends Religious Services: Never    Database administrator or Organizations: No    Attends Banker  Meetings: Never    Marital Status: Married    Allergies:  Allergies  Allergen Reactions   Bupropion    Jardiance [Empagliflozin] Other (See Comments)   Penicillins Rash    Metabolic Disorder Labs: No results found for: "HGBA1C", "MPG" No results found for: "PROLACTIN" No results found for: "CHOL", "TRIG", "HDL", "CHOLHDL", "VLDL", "LDLCALC" Lab Results  Component Value Date   TSH 0.383 10/07/2018    Therapeutic Level Labs: No results found for: "LITHIUM" No results found for: "VALPROATE" No results found for: "CBMZ"  Current Medications: Current Outpatient Medications  Medication Sig Dispense Refill   albuterol (VENTOLIN HFA) 108 (90 Base) MCG/ACT inhaler Inhale 2 puffs into the lungs every 6 (six) hours as needed for wheezing or shortness of breath. 18 g 3   aspirin EC 81 MG tablet Take by mouth.     azelastine (ASTELIN) 0.1 % nasal spray Place into  the nose.     Cholecalciferol (VITAMIN D) 2000 UNITS tablet Take by mouth daily.      doxepin (SINEQUAN) 10 MG capsule Take 10 mg by mouth.     folic acid (FOLVITE) 1 MG tablet Take 1 mg by mouth daily.     hydroxychloroquine (PLAQUENIL) 200 MG tablet Take 100 mg by mouth daily.      inFLIXimab (REMICADE) 100 MG injection Inject 100 mg into the vein every 8 (eight) weeks.     levothyroxine (SYNTHROID) 50 MCG tablet Take 200 mcg by mouth at bedtime.     loratadine (CLARITIN) 10 MG tablet Take 10 mg by mouth daily.      methotrexate (RHEUMATREX) 2.5 MG tablet TAKE 4 TABLETS (10 MG TOTAL) BY MOUTH EVERY 7 (SEVEN) DAYS WITH A MEAL  5   metoprolol succinate (TOPROL-XL) 100 MG 24 hr tablet Take 1 tablet (100 mg total) by mouth daily. Take with or immediately following a meal. 30 tablet 0   Multiple Vitamin (MULTI-VITAMINS) TABS Take by mouth.     ondansetron (ZOFRAN) 4 MG tablet Take 1 tablet (4 mg total) by mouth every 8 (eight) hours as needed. 20 tablet 0   [START ON 08/06/2021] oxyCODONE (OXY IR/ROXICODONE) 5 MG immediate release  tablet Take 1 tablet (5 mg total) by mouth every 6 (six) hours as needed for severe pain. Must last 30 days 120 tablet 0   [START ON 09/05/2021] oxyCODONE (OXY IR/ROXICODONE) 5 MG immediate release tablet Take 1 tablet (5 mg total) by mouth every 6 (six) hours as needed for severe pain. Must last 30 days 120 tablet 0   oxyCODONE (OXY IR/ROXICODONE) 5 MG immediate release tablet Take 1 tablet (5 mg total) by mouth every 6 (six) hours as needed for severe pain. Must last 30 days 120 tablet 0   rosuvastatin (CRESTOR) 5 MG tablet Take 5 mg by mouth 2 (two) times a week.     spironolactone (ALDACTONE) 25 MG tablet TAKE 1/2 (ONE-HALF) TABLET BY MOUTH DAILY  11   DULoxetine (CYMBALTA) 60 MG capsule TAKE 1 CAPSULE BY MOUTH EVERY DAY 90 capsule 1   hydrOXYzine (VISTARIL) 25 MG capsule Take 3 capsules (75 mg total) by mouth at bedtime as needed. 90 capsule 3   No current facility-administered medications for this visit.     Musculoskeletal: Strength & Muscle Tone:  UTA Gait & Station:  UTA Patient leans: N/A  Psychiatric Specialty Exam: Review of Systems  Musculoskeletal:  Positive for arthralgias and back pain.       Hip pain, knee pain  Neurological:        Left foot drop   Psychiatric/Behavioral:  Positive for sleep disturbance.        Reports hopelessness due to medical issues - but denies SI  All other systems reviewed and are negative.   There were no vitals taken for this visit.There is no height or weight on file to calculate BMI.  General Appearance:  UTA  Eye Contact:   UTA  Speech:  Clear and Coherent  Volume:  Normal  Mood:  Hopeless  Affect:   UTA  Thought Process:  Goal Directed and Descriptions of Associations: Intact  Orientation:  Full (Time, Place, and Person)  Thought Content: Logical   Suicidal Thoughts:  No  Homicidal Thoughts:  No  Memory:  Immediate;   Fair Recent;   Fair Remote;   Fair  Judgement:  Fair  Insight:  Fair  Psychomotor Activity:   UTA  Concentration:  Concentration: Fair and Attention Span: Fair  Recall:  [employer] of Knowledge: Fair  Language: Fair  Akathisia:  No  Handed:  Right  AIMS (if indicated): not done  Assets:  Communication Skills Desire for Improvement Housing Social Support Transportation  ADL's:  Intact  Cognition: WNL  Sleep:   Restless on and off due to pain   Screenings: AIMS    Flowsheet Row Office Visit from 04/28/2020 in [facility]  AIMS Total Score 0      PHQ2-9    Flowsheet Row Office Visit from [facility]/21/2021 in [facility] Office Visit from 03/09/2021 in [facility] Procedure visit from 12/22/2020 in [facility] Office Visit from 12/09/2020 in [facility] Procedure visit from 07/07/2020 in [facility]  PHQ-2 Total Score 0 0 0 0 0      Flowsheet Row ED from 02/15/2021 in [facility] ED from [facility]/14/2021 in [facility] ED from 11/14/2020 in [facility]  C-SSRS RISK CATEGORY No Risk No Risk No Risk        Assessment and Plan: Laura [name] is a 76 year old Caucasian female, married, retired, lives in [place], has a history of depression, multiple medical problems, chronic pain, rheumatoid arthritis was evaluated by phone today.  Patient with sleep problems likely due to pain currently on doxepin recently added by primary care provider.  Patient will benefit from the following plan.  Plan MDD in remission Cymbalta 60 mg p.o. daily-reduced dosage  Primary insomnia-improving Continue hydroxyzine 25 to 75 mg p.o. nightly as needed Continue doxepin 10 mg p.o. nightly.   Follow-up in clinic in 5 months or sooner if  needed.   I have spent at least 12 minutes non face to face with patient today.     Consent: Patient/Guardian gives verbal consent for treatment and assignment of benefits for services provided during this visit. Patient/Guardian expressed understanding and agreed to proceed.    Jomarie Longs, MD 07/21/2021, 8:30 AM

## 2021-07-21 ENCOUNTER — Other Ambulatory Visit: Payer: Self-pay | Admitting: Psychiatry

## 2021-07-21 DIAGNOSIS — F3342 Major depressive disorder, recurrent, in full remission: Secondary | ICD-10-CM

## 2021-07-28 NOTE — Progress Notes (Signed)
Referring Physician:  Lynnea Ferrier, MD [address] [facility] [place],  [place] [address]  Primary Physician:  Laura Ferrier, MD  History of Present Illness: 07/28/2021 [name] is here today with a chief complaint of  low back pain as well as intermittent numbness in her legs particularly from her knees distally. She also endorses intermittent pain from her back radiating to her buttocks worse on the left than the right. Along with progressive weakness in her right foot. She has also been having urinary incontinence for the past 6-7 months.   She has been having progressive issues over the past 10 years.  Her pain is as bad as 9 out of 10 particular when she stands or walks.  She can walk with physical therapy, but is very limited due to her bilateral foot drops.   Bowel/Bladder Dysfunction: none  Conservative measures:  Physical therapy: has participated in Home Health PT with some relief Multimodal medical therapy including regular antiinflammatories: cymbalta, oxycodone Injections: has had epidural steroid injections with Dr. Laban [name]   Past Surgery: denies  Laura [name] has no symptoms of cervical myelopathy.  The symptoms are causing a significant impact on the patient's life.    Progress Note from Laura [name], [place] on 06/03/21: History of Present Illness: 06/03/2021 [name] is a 76 y.o with a history of obesity, rheumatoid arthritis on Plaquenil,CAD, HLD, lymphedema, hypertension, A-fib, CKD and chronic lumbosacral complaints who is here today for evaluation of her lumbar spine.  She states that she has continued to have low back pain since her visit last year as well as intermittent numbness in her legs particularly from her knees distally that seems to be worse with activity. She also endorses intermittent pain from her back radiating to her buttocks worse on the left than the right. She states she did have a fall  about a year and a half ago which resulted in pain in her left lateral calf this is since resolved however she notes about 6 to 7 months of progressive weakness in her right foot which is new since her last visit. She also endorses about a year and a half intermittent urinary incontinence and wears diapers. Due to her progressive weakness in her lower extremities she has a fear of falling and is thus largely wheelchair-bound at home. Review of Systems:  A 10 point review of systems is negative, except for the pertinent positives and negatives detailed in the HPI.  Past Medical History: Past Medical History:  Diagnosis Date   Allergy    Anxiety    Arthritis, degenerative 10/08/2013   Overview:    a.  Lumbar spine/spinal stenosis/foot drop.   b.  Hands.    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   Memory loss, short term 03/19/2014   Rheumatoid arthritis (HCC)    Sciatica of left side 12/31/2019   Seizure (HCC) 10/07/2014   Sleep apnea    Sleep apnea    Thyroid disease     Past Surgical History: Past Surgical History:  Procedure Laterality Date   ABDOMINAL HYSTERECTOMY     CESAREAN SECTION     HIP SURGERY Right    REPLACEMENT TOTAL KNEE Left     Allergies: Allergies as of 07/29/2021 - Review  Complete 07/20/2021  Allergen Reaction Noted   Bupropion     Jardiance [empagliflozin] Other (See Comments) 01/14/2021   Penicillins Rash 10/07/2014    Medications: No outpatient medications have been marked as taking for the 07/29/21 encounter (Appointment) with Laura Night, MD.    Social History: Social History   Tobacco Use   Smoking status: Never   Smokeless tobacco: Never  Vaping Use   Vaping Use: Never used  Substance Use Topics   Alcohol use: No    Alcohol/week: 0.0 standard  drinks of alcohol   Drug use: No    Family Medical History: Family History  Problem Relation Age of Onset   Hypertension Mother    Stroke Mother    Heart attack Father    Alcohol abuse Father    Depression Father    Post-traumatic stress disorder Father    Rheum arthritis Sister    Hypertension Sister    Depression Sister    Breast cancer Neg Hx     Physical Examination: There were no vitals filed for this visit.  General: Patient is well developed, well nourished, calm, collected, and in no apparent distress. Attention to examination is appropriate.  Neck:   Supple.  Full range of motion.  Respiratory: Patient is breathing without any difficulty.   NEUROLOGICAL:     Awake, alert, oriented to person, place, and time.  Speech is clear and fluent. Fund of knowledge is appropriate.   Cranial Nerves: Pupils equal round and reactive to light.  Facial tone is symmetric.  Facial sensation is symmetric. Shoulder shrug is symmetric. Tongue protrusion is midline.  There is no pronator drift.  ROM of spine: full.    Strength: Side Biceps Triceps Deltoid Interossei Grip Wrist Ext. Wrist Flex.  R 5 5 5 5 5 5 5   L 5 5 5 5 5 5 5    Side Iliopsoas Quads Hamstring PF DF EHL  R 5 5 5 3  0 0  L 5 5 5 3  0 0   Reflexes are 1+ and symmetric at the biceps, triceps, brachioradialis, patella and achilles.   Hoffman's is absent.  Clonus is not present.   Bilateral upper and lower extremity sensation is symmetric but limited in her bilateral extremities to light touch.    No evidence of dysmetria noted.  Gait is untested due to bilateral foot drop.     Medical Decision Making  Imaging: MRI L spine 04/29/21 IMPRESSION: 1. Advanced lumbar spine degeneration with L3-4 and L4-5 anterolisthesis. 2. L3-4 chronic, severe spinal stenosis. 3. L2-3 and L4-5 moderate to advanced spinal stenosis. Bilateral L5 impingement at the subarticular recesses of L4-5.     Electronically Signed   By:  M.D.   On: 04/30/2021 06:15  I have personally reviewed the images and agree with the above interpretation.  Assessment and Plan: [name] is a pleasant 76 y.o. female with back pain, neurogenic claudication, and bilateral foot drop.  Her foot drop is longstanding.  She has tried and failed physical therapy.  I think she may benefit from a lumbar decompression with L2-5 decompression.  That being said, I would like her to get a nerve conduction study so that we may have more information to provide appropriate guidance regarding expectations for outcome.  Though she has an L3-4 anterolisthesis, I would not recommend a larger surgery with instrumented fusion.  She and her daughter did describe some issues with continence during her sleep.  She is able to feel and  urinate normally during the day.  I think this is more likely related to multifactorial causes of incontinence that may be impacted by but not directly caused by her lumbar stenosis.  After she gets her nerve conduction study, we will discuss planning for an L2-5 decompression.  She is seeing her primary care provider in a couple of weeks.  Have asked that she discuss any preoperative clearance she might need with him before to move forward.  I spent a total of 30 minutes in face-to-face and non-face-to-face activities related to this patient's care today.  Thank you for involving me in the care of this patient.      Alauna Hayden K. Myer Haff MD, [facility] Neurosurgery

## 2021-07-29 ENCOUNTER — Encounter: Payer: Self-pay | Admitting: Neurosurgery

## 2021-07-29 ENCOUNTER — Ambulatory Visit: Payer: Medicare Other | Admitting: Neurosurgery

## 2021-07-29 VITALS — BP 128/82 | Ht 65.0 in | Wt 254.0 lb

## 2021-07-29 DIAGNOSIS — M48062 Spinal stenosis, lumbar region with neurogenic claudication: Secondary | ICD-10-CM

## 2021-08-16 ENCOUNTER — Telehealth: Payer: Self-pay | Admitting: Pain Medicine

## 2021-08-16 ENCOUNTER — Other Ambulatory Visit: Payer: Self-pay

## 2021-08-16 DIAGNOSIS — M51379 Other intervertebral disc degeneration, lumbosacral region without mention of lumbar back pain or lower extremity pain: Secondary | ICD-10-CM

## 2021-08-16 DIAGNOSIS — M5137 Other intervertebral disc degeneration, lumbosacral region: Secondary | ICD-10-CM

## 2021-08-16 DIAGNOSIS — G8929 Other chronic pain: Secondary | ICD-10-CM

## 2021-08-16 DIAGNOSIS — M797 Fibromyalgia: Secondary | ICD-10-CM

## 2021-08-16 DIAGNOSIS — M159 Polyosteoarthritis, unspecified: Secondary | ICD-10-CM

## 2021-08-16 DIAGNOSIS — G894 Chronic pain syndrome: Secondary | ICD-10-CM

## 2021-08-16 DIAGNOSIS — Z79891 Long term (current) use of opiate analgesic: Secondary | ICD-10-CM

## 2021-08-16 DIAGNOSIS — Z79899 Other long term (current) drug therapy: Secondary | ICD-10-CM

## 2021-08-16 MED ORDER — OXYCODONE HCL 5 MG PO TABS: 5.0000 mg | ORAL_TABLET | Freq: Four times a day (QID) | ORAL | 0 refills | Status: DC | PRN

## 2021-08-16 NOTE — Telephone Encounter (Signed)
Patient stated that she needed her refilled to be call into [facility] on [address] in [place]. Pt stated that the pharmacy has the meds she needs in stock. Please give patient a call. Thanks

## 2021-08-16 NOTE — Telephone Encounter (Signed)
Previous 2 medications were sent to [facility] and the pharmacist discontinued both scripts due to patient behavior and they refuse to fill any medications for this patient,.  Refill request sent to Dr Laban Emperor for 2 scripts for oxycodone [facility] on [address].

## 2021-08-17 ENCOUNTER — Telehealth: Payer: Self-pay

## 2021-08-17 NOTE — Telephone Encounter (Signed)
Pt left a voicemail to update [place] that her EMG is scheduled for 09/06/21

## 2021-09-02 ENCOUNTER — Telehealth: Payer: Self-pay

## 2021-09-02 NOTE — Telephone Encounter (Signed)
-----   Message from Rockey Situ sent at 09/02/2021  2:23 PM EDT ----- Regarding: FYI HH reporting fall Contact: [phone] Loraine Leriche with [facility] Reporting a fall on Sunday. She fell from her bed trying to get into her wheelchair.

## 2021-09-08 ENCOUNTER — Telehealth: Payer: Self-pay

## 2021-09-08 NOTE — Telephone Encounter (Signed)
-----   Message from Rockey Situ sent at 09/08/2021  3:00 PM EDT ----- Regarding: [facility] Contact: [phone] Misty Stanley with [phone] Verbal order to extend PT 1 x 4  Also patient is telling Misty Stanley that she did show up for her EMG but was told they could not do it because of the swelling in her legs. What are Dr.Yarbrough's thoughts on that?

## 2021-09-09 NOTE — Telephone Encounter (Signed)
Ok to continue [facility].  Dr Jeannie Fend said she needs to come in for a return visit if they can't complete the EMG

## 2021-09-10 NOTE — Telephone Encounter (Signed)
Stacy notified of verbal order and she will have the patient call the office to schedule a follow-up appt.

## 2021-09-11 ENCOUNTER — Encounter: Payer: Self-pay | Admitting: Neurosurgery

## 2021-09-23 NOTE — Progress Notes (Unsigned)
PROVIDER NOTE: Information contained herein reflects review and annotations entered in association with encounter. Interpretation of such information and data should be left to medically-trained personnel. Information provided to patient can be located elsewhere in the medical record under "Patient Instructions". Document created using STT-dictation technology, any transcriptional errors that may result from process are unintentional.    Patient: Laura [name]  Service Category: E/M  Provider: Gaspar Cola, MD  DOB: 1945-08-25  DOS: 09/27/2021  Referring Provider: Adin Hector, MD  MRN: [id number]  Specialty: Interventional Pain Management  PCP: Adin Hector, MD  Type: Established Patient  Setting: Ambulatory outpatient    Location: Office  Delivery: Face-to-face     HPI  Laura [name], a 76 y.o. year old female, is here today because of her Chronic pain syndrome [G89.4]. Ms. Volcy's primary complain today is No chief complaint on file. Last encounter: My last encounter with her was on 08/16/2021. 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); Lumbar foraminal stenosis (Bilateral L3-4 and L5-S1); Chronic hip pain (Left); Osteoarthritis of knee (Right); Arthritis; Fibromyalgia syndrome; Chronic arthralgias of knees and hips (Right); Chronic hand pain, left; Dropfoot (Left); Weakness of foot (Left); Sciatica (Left); Weakness of leg (Left); Chronic lower extremity pain (2ry area of Pain) (Bilateral); Lumbosacral  radiculopathy at L5 (Left); Abnormal MRI, lumbar spine (04/30/2021 & 12/26/2019); DDD (degenerative disc disease), lumbosacral; Statin myopathy; Foot drop, left; 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 Pain Assessment: Severity of   is reported as a  /10. Location:    / . Onset:  . Quality:  . Timing:  . Modifying factor(s):  Marland Kitchen Vitals:  vitals were not taken for this visit.   Reason for encounter: medication management. ***  RTCB: 01/03/2022  Pharmacotherapy Assessment  Analgesic: Oxycodone IR 5 mg every 6 hours (20 mg/day) MME/day: 30 mg/day.   Monitoring: [place] PMP: PDMP reviewed during this encounter.       Pharmacotherapy: No side-effects or adverse reactions reported. Compliance: No problems identified. Effectiveness: Clinically acceptable.  No notes on file  No results found for: "CBDTHCR" No results found for: "D8THCCBX" No results found for: "D9THCCBX"  UDS:  Summary  Date Value Ref Range Status  06/22/2021 Note  Final    Comment:    ==================================================================== [employer] Select 13 (MW) ==================================================================== Test                             Result       Flag       Units  Drug Present and Declared for Prescription Verification   Oxycodone                      1634         EXPECTED   ng/mg creat  Oxymorphone                    770          EXPECTED   ng/mg creat   Noroxycodone                   2933         EXPECTED   ng/mg creat   Noroxymorphone                 191          EXPECTED   ng/mg creat    Sources of oxycodone are scheduled prescription  medications.    Oxymorphone, noroxycodone, and noroxymorphone are expected    metabolites of oxycodone. Oxymorphone is also available as a    scheduled prescription medication.  ==================================================================== Test                      Result    Flag   Units      Ref Range   Creatinine              94               mg/dL      >=20 ==================================================================== Declared Medications:  The flagging and interpretation on this report are based on the  following declared medications.  Unexpected results may arise from  inaccuracies in the declared medications.   **Note: The testing scope of this panel includes these medications:   Oxycodone (OxyIR)   **Note: The testing scope of this panel does not include the  following reported medications:   Albuterol (Ventolin HFA)  Aspirin  Azelastine (Astelin)  Duloxetine (Cymbalta)  Folic Acid  Furosemide (Lasix)  Hydroxychloroquine (Plaquenil)  Hydroxyzine (Vistaril)  Infliximab (Remicade)  Levothyroxine (Synthroid)  Loratadine (Claritin)  Methotrexate  Metoprolol (Toprol)  Multivitamin  Ondansetron (Zofran)  Rosuvastatin (Crestor)  Spironolactone (Aldactone)  Trazodone (Desyrel)  Vitamin D ==================================================================== For clinical consultation, please call [phone]. ====================================================================       ROS  Constitutional: Denies any fever or chills Gastrointestinal: No reported hemesis, hematochezia, vomiting, or acute GI distress Musculoskeletal: Denies any acute onset joint swelling, redness, loss of ROM, or weakness Neurological: No reported episodes of acute onset apraxia, aphasia, dysarthria, agnosia, amnesia, paralysis, loss of coordination, or loss of consciousness  Medication Review  DULoxetine, Multi-Vitamins, Vitamin D, albuterol, aspirin EC, azelastine,  doxepin, folic acid, hydrOXYzine, hydroxychloroquine, inFLIXimab, levothyroxine, loratadine, methotrexate, metoprolol succinate, ondansetron, oxyCODONE, rosuvastatin, and spironolactone  History Review  Allergy: Laura [name] is allergic to bupropion, jardiance [empagliflozin], and penicillins. Drug: Ms. Nodal  reports no history of drug use. Alcohol:  reports no history of alcohol use. Tobacco:  reports that she has never smoked. She has never used smokeless tobacco. Social: Ms. Cocker  reports that she has never smoked. She has never used smokeless tobacco. She reports that she does not drink alcohol and does not use drugs. Medical:  has a past medical history of Allergy, Anxiety, Arthritis, degenerative (10/08/2013), Asthma, Chronic hand pain, left (10/10/2019), Chronic kidney disease, Depression, Lumbar spinal stenosis with neurogenic claudication (11/07/2014), Major depression, single episode, in complete remission ([place]) (06/25/2015), Memory loss, short term (03/19/2014), Rheumatoid arthritis ([place]), Sciatica of left side (12/31/2019), Seizure ([place]) (10/07/2014), Sleep apnea, Sleep apnea, and Thyroid disease. Surgical: Ms. Treloar  has a past surgical history that includes Abdominal hysterectomy; Cesarean section; Replacement total knee (Left); and Hip surgery (Right). Family: family history includes Alcohol  abuse in her father; Depression in her father and sister; Heart attack in her father; Hypertension in her mother and sister; Post-traumatic stress disorder in her father; Rheum arthritis in her sister; Stroke in her mother.  Laboratory Chemistry Profile   Renal Lab Results  Component Value Date   BUN 34 (H) 02/15/2021   CREATININE 1.37 (H) 02/15/2021   GFRAA 57 (L) 10/03/2019   GFRNONAA 40 (L) 02/15/2021    Hepatic Lab Results  Component Value Date   AST 20 01/14/2021   ALT 11 01/14/2021   ALBUMIN 4.0 01/14/2021   ALKPHOS 55 01/14/2021   LIPASE 22 01/14/2021     Electrolytes Lab Results  Component Value Date   NA 132 (L) 02/15/2021   K 4.2 02/15/2021   CL 95 (L) 02/15/2021   CALCIUM 9.4 02/15/2021   MG 2.3 10/08/2018    Bone Lab Results  Component Value Date   25OHVITD1 36 07/15/2015   25OHVITD2 3.6 07/15/2015   25OHVITD3 32 07/15/2015    Inflammation (CRP: Acute Phase) (ESR: Chronic Phase) Lab Results  Component Value Date   CRP 1.9 (H) 07/15/2015   ESRSEDRATE 32 (H) 07/15/2015   LATICACIDVEN 1.4 10/07/2018         Note: Above Lab results reviewed.  Recent Imaging Review  MR LUMBAR SPINE WO CONTRAST CLINICAL DATA:  Low back pain with symptoms persisting over 6 weeks. Spinal stenosis  EXAM: MRI LUMBAR SPINE WITHOUT CONTRAST  TECHNIQUE: Multiplanar, multisequence MR imaging of the lumbar spine was performed. No intravenous contrast was administered.  COMPARISON:  12/26/2019  FINDINGS: Segmentation:  5 lumbar type vertebrae  Alignment:  Degenerative grade 1 anterolisthesis at L3-4 and L4-5  Vertebrae:  No fracture, evidence of discitis, or bone lesion.  Conus medullaris and cauda equina: Conus extends to the T12-L1 level. Conus appears normal. There is cauda equina redundancy due to the degree of severe spinal stenosis at L3-4.  Paraspinal and other soft tissues: Fatty atrophy of intrinsic back muscles.  Disc levels:  T12- L1: Unremarkable.  L1-L2: Mild disc narrowing and bulging. Mild facet spurring. Mild spinal stenosis  L2-L3: Disc narrowing and bulging with facet spurring and ligamentum flavum thickening. Moderate spinal stenosis that is similar or mildly progressed from before. Noncompressive bilateral foraminal narrowing based on sagittal images  L3-L4: Facet osteoarthritis with spurring and ligamentous thickening. Mild anterolisthesis. Circumferential disc bulging with severe spinal stenosis effacing CSF. Noncompressive bilateral foraminal narrowing  L4-L5: Disc narrowing and bulging. Advanced  facet osteoarthritis with spurring and anterolisthesis. Moderate spinal stenosis. Bilateral L5 impingement at the subarticular recesses. Patent foramina  L5-S1:Disc narrowing and bulging with endplate and facet spurring. Patent canal and foramina.  IMPRESSION: 1. Advanced lumbar spine degeneration with L3-4 and L4-5 anterolisthesis. 2. L3-4 chronic, severe spinal stenosis. 3. L2-3 and L4-5 moderate to advanced spinal stenosis. Bilateral L5 impingement at the subarticular recesses of L4-5.  Electronically Signed   By: Jorje Guild M.D.   On: 04/30/2021 06:15 Note: Reviewed        Physical Exam  General appearance: Well nourished, well developed, and well hydrated. In no apparent acute distress Mental status: Alert, oriented x 3 (person, place, & time)       Respiratory: No evidence of acute respiratory distress Eyes: PERLA Vitals: There were no vitals taken for this visit. BMI: Estimated body mass index is 42.27 kg/m as calculated from the following:   Height as of 07/29/21: '5\' 5"'  (1.651 m).   Weight as of 07/29/21: 254 lb (115.2  kg). Ideal: Patient weight not recorded  Assessment   Diagnosis Status  1. Chronic pain syndrome   2. Chronic low back pain (1ry area of Pain) (Bilateral) (L>R) w/ sciatica (Bilateral)   3. Chronic lower extremity pain (2ry area of Pain) (Bilateral)   4. DDD (degenerative disc disease), lumbosacral   5. Chronic hip pain (Left)   6. Chronic knee pain (Right)   7. Generalized osteoarthritis of multiple sites   8. Fibromyalgia syndrome   9. Pharmacologic therapy   10. Chronic use of opiate for therapeutic purpose   11. Encounter for medication management   12. Encounter for chronic pain management    Controlled Controlled Controlled   Updated Problems: No problems updated.  Plan of Care  Problem-specific:  No problem-specific Assessment & Plan notes found for this encounter.  Ms. Akaisha Truman has a current medication list which  includes the following long-term medication(s): albuterol, azelastine, doxepin, duloxetine, infliximab, levothyroxine, loratadine, metoprolol succinate, oxycodone, and spironolactone.  Pharmacotherapy (Medications Ordered): No orders of the defined types were placed in this encounter.  Orders:  No orders of the defined types were placed in this encounter.  Follow-up plan:   No follow-ups on file.     Interventional Therapies  Risk  Complexity Considerations:   Estimated body mass index is 38.27 kg/m as calculated from the following:   Height as of 12/22/20: '5\' 5"'  (1.651 m).   Weight as of 12/22/20: 230 lb (104.3 kg). PLAQUENIL Anticoagulation (Stop: 11 days)   Planned  Pending:      Under consideration:   Diagnostic bilateral genicular NB  Diagnostic bilateral L3 and/or L5 TFESI   Completed:   Therapeutic right IA steroid knee injection x2 (10/15/2015)  Therapeutic right Hyalgan knee injection x1 (09/01/2016)  Therapeutic left lumbar facet block x1 (01/22/2015)  Therapeutic left IA hip injection x1 (03/17/2015)  Therapeutic left L4-5 LESI x1 (02/04/2020) (100/100/85/85)  Therapeutic midline L2-3 LESI x3 (12/22/2020) (95/95/95/95)    Therapeutic  Palliative (PRN) options:   Therapeutic right IA Hyalgan knee injections Palliative left IA hip joint injection  Diagnostic left lumbar facet MBB #2       Recent Visits No visits were found meeting these conditions. Showing recent visits within past 90 days and meeting all other requirements Future Appointments Date Type Provider Dept  09/27/21 Appointment Milinda Pointer, MD [facility]  Showing future appointments within next 90 days and meeting all other requirements  I discussed the assessment and treatment plan with the patient. The patient was provided an opportunity to ask questions and all were answered. The patient agreed with the plan and demonstrated an understanding of the instructions.  Patient advised  to call back or seek an in-person evaluation if the symptoms or condition worsens.  Duration of encounter: *** minutes.  Total time on encounter, as per AMA guidelines included both the face-to-face and non-face-to-face time personally spent by the physician and/or other qualified health care professional(s) on the day of the encounter (includes time in activities that require the physician or other qualified health care professional and does not include time in activities normally performed by clinical staff). Physician's time may include the following activities when performed: preparing to see the patient (eg, review of tests, pre-charting review of records) obtaining and/or reviewing separately obtained history performing a medically appropriate examination and/or evaluation counseling and educating the patient/family/caregiver ordering medications, tests, or procedures referring and communicating with other health care professionals (when not separately reported) documenting clinical information in the  electronic or other health record independently interpreting results (not separately reported) and communicating results to the patient/ family/caregiver care coordination (not separately reported)  Note by: Gaspar Cola, MD Date: 09/27/2021; Time: 1:12 PM

## 2021-09-27 ENCOUNTER — Encounter: Payer: Self-pay | Admitting: Pain Medicine

## 2021-09-27 ENCOUNTER — Ambulatory Visit: Payer: Medicare Other | Attending: Pain Medicine | Admitting: Pain Medicine

## 2021-09-27 VITALS — BP 137/86 | HR 75 | Temp 97.2°F | Ht 65.0 in | Wt 254.0 lb

## 2021-09-27 DIAGNOSIS — M159 Polyosteoarthritis, unspecified: Secondary | ICD-10-CM

## 2021-09-27 DIAGNOSIS — M51379 Other intervertebral disc degeneration, lumbosacral region without mention of lumbar back pain or lower extremity pain: Secondary | ICD-10-CM

## 2021-09-27 DIAGNOSIS — G894 Chronic pain syndrome: Secondary | ICD-10-CM | POA: Diagnosis present

## 2021-09-27 DIAGNOSIS — I2510 Atherosclerotic heart disease of native coronary artery without angina pectoris: Secondary | ICD-10-CM

## 2021-09-27 DIAGNOSIS — M797 Fibromyalgia: Secondary | ICD-10-CM

## 2021-09-27 DIAGNOSIS — M79605 Pain in left leg: Secondary | ICD-10-CM | POA: Diagnosis present

## 2021-09-27 DIAGNOSIS — M48062 Spinal stenosis, lumbar region with neurogenic claudication: Secondary | ICD-10-CM | POA: Diagnosis present

## 2021-09-27 DIAGNOSIS — M5137 Other intervertebral disc degeneration, lumbosacral region: Secondary | ICD-10-CM | POA: Diagnosis present

## 2021-09-27 DIAGNOSIS — M48061 Spinal stenosis, lumbar region without neurogenic claudication: Secondary | ICD-10-CM | POA: Diagnosis present

## 2021-09-27 DIAGNOSIS — Z79891 Long term (current) use of opiate analgesic: Secondary | ICD-10-CM

## 2021-09-27 DIAGNOSIS — M79642 Pain in left hand: Secondary | ICD-10-CM | POA: Diagnosis present

## 2021-09-27 DIAGNOSIS — M2428 Disorder of ligament, vertebrae: Secondary | ICD-10-CM | POA: Diagnosis present

## 2021-09-27 DIAGNOSIS — G8929 Other chronic pain: Secondary | ICD-10-CM

## 2021-09-27 DIAGNOSIS — M47816 Spondylosis without myelopathy or radiculopathy, lumbar region: Secondary | ICD-10-CM

## 2021-09-27 DIAGNOSIS — M25552 Pain in left hip: Secondary | ICD-10-CM | POA: Insufficient documentation

## 2021-09-27 DIAGNOSIS — M25561 Pain in right knee: Secondary | ICD-10-CM | POA: Insufficient documentation

## 2021-09-27 DIAGNOSIS — R937 Abnormal findings on diagnostic imaging of other parts of musculoskeletal system: Secondary | ICD-10-CM | POA: Diagnosis present

## 2021-09-27 DIAGNOSIS — M79604 Pain in right leg: Secondary | ICD-10-CM | POA: Diagnosis present

## 2021-09-27 DIAGNOSIS — Z79899 Other long term (current) drug therapy: Secondary | ICD-10-CM

## 2021-09-27 DIAGNOSIS — M0600 Rheumatoid arthritis without rheumatoid factor, unspecified site: Secondary | ICD-10-CM | POA: Diagnosis present

## 2021-09-27 DIAGNOSIS — M5441 Lumbago with sciatica, right side: Secondary | ICD-10-CM | POA: Diagnosis present

## 2021-09-27 DIAGNOSIS — M4316 Spondylolisthesis, lumbar region: Secondary | ICD-10-CM

## 2021-09-27 DIAGNOSIS — M5442 Lumbago with sciatica, left side: Secondary | ICD-10-CM | POA: Insufficient documentation

## 2021-09-27 MED ORDER — OXYCODONE HCL 5 MG PO TABS: 5.0000 mg | ORAL_TABLET | Freq: Four times a day (QID) | ORAL | 0 refills | Status: DC | PRN

## 2021-09-27 NOTE — Patient Instructions (Signed)

## 2021-09-27 NOTE — Progress Notes (Signed)
Nursing Pain Medication Assessment:  Safety precautions to be maintained throughout the outpatient stay will include: orient to surroundings, keep bed in low position, maintain call bell within reach at all times, provide assistance with transfer out of bed and ambulation.   Medication Inspection Compliance: Pill count conducted under aseptic conditions, in front of the patient. Neither the pills nor the bottle was removed from the patient's sight at any time. Once count was completed pills were immediately returned to the patient in their original bottle.  Medication: Oxycodone IR Pill/Patch Count:  88 of 120 pills remain Pill/Patch Appearance: Markings consistent with prescribed medication Bottle Appearance: Standard pharmacy container. Clearly labeled. Filled Date: 58 / 7 / 2023 Last Medication intake:  Today

## 2021-10-05 ENCOUNTER — Ambulatory Visit: Payer: Medicare Other | Admitting: Neurosurgery

## 2021-10-06 ENCOUNTER — Telehealth: Payer: Self-pay

## 2021-10-06 NOTE — Telephone Encounter (Signed)
Yes

## 2021-10-06 NOTE — Telephone Encounter (Signed)
-----   Message from [name] sent at 10/06/2021  2:57 PM EDT ----- Regarding: [facility] order Contact: [phone] [name] with [facility] Re-cert her for PT she is making progress. 1 x 8

## 2021-10-06 NOTE — Telephone Encounter (Signed)
Left message on secure voice mail.

## 2021-10-08 ENCOUNTER — Encounter: Payer: Self-pay | Admitting: Neurosurgery

## 2021-10-17 ENCOUNTER — Other Ambulatory Visit: Payer: Self-pay | Admitting: Psychiatry

## 2021-10-17 DIAGNOSIS — F3342 Major depressive disorder, recurrent, in full remission: Secondary | ICD-10-CM

## 2021-10-18 ENCOUNTER — Telehealth: Payer: Self-pay | Admitting: Pain Medicine

## 2021-10-18 NOTE — Telephone Encounter (Signed)
PT Daughter stated that her mother prescription for Oxcodone needs to be refilled. Daughter stated that she has already spoke with the pharmacy and they have the medication in stock. Please give patient a call, when prescription has been send in. Thanks

## 2021-10-18 NOTE — Telephone Encounter (Signed)
Patient's daughter Kathlee Nations notified that there are prescriptions for Oxycodone at the pharmacy.

## 2021-10-19 ENCOUNTER — Other Ambulatory Visit: Payer: Self-pay | Admitting: Psychiatry

## 2021-10-19 DIAGNOSIS — F3342 Major depressive disorder, recurrent, in full remission: Secondary | ICD-10-CM

## 2021-10-21 ENCOUNTER — Encounter: Payer: Self-pay | Admitting: Pain Medicine

## 2021-10-21 ENCOUNTER — Other Ambulatory Visit: Payer: Self-pay | Admitting: Psychiatry

## 2021-10-21 ENCOUNTER — Telehealth: Payer: Self-pay

## 2021-10-21 ENCOUNTER — Ambulatory Visit
Admission: RE | Admit: 2021-10-21 | Discharge: 2021-10-21 | Disposition: A | Discharge status: Home / Self Care | Payer: Medicare Other | Source: Ambulatory Visit | Attending: Pain Medicine | Admitting: Pain Medicine

## 2021-10-21 ENCOUNTER — Ambulatory Visit: Payer: Medicare Other | Attending: Pain Medicine | Admitting: Pain Medicine

## 2021-10-21 VITALS — BP 149/79 | HR 70 | Temp 97.4°F | Resp 18 | Ht 65.0 in | Wt 230.0 lb

## 2021-10-21 DIAGNOSIS — M4316 Spondylolisthesis, lumbar region: Secondary | ICD-10-CM | POA: Insufficient documentation

## 2021-10-21 DIAGNOSIS — M5410 Radiculopathy, site unspecified: Secondary | ICD-10-CM | POA: Insufficient documentation

## 2021-10-21 DIAGNOSIS — Z6841 Body Mass Index (BMI) 40.0 and over, adult: Secondary | ICD-10-CM | POA: Diagnosis present

## 2021-10-21 DIAGNOSIS — M79605 Pain in left leg: Secondary | ICD-10-CM | POA: Diagnosis present

## 2021-10-21 DIAGNOSIS — Z7901 Long term (current) use of anticoagulants: Secondary | ICD-10-CM | POA: Insufficient documentation

## 2021-10-21 DIAGNOSIS — M48061 Spinal stenosis, lumbar region without neurogenic claudication: Secondary | ICD-10-CM | POA: Diagnosis present

## 2021-10-21 DIAGNOSIS — M5417 Radiculopathy, lumbosacral region: Secondary | ICD-10-CM | POA: Diagnosis present

## 2021-10-21 DIAGNOSIS — M48062 Spinal stenosis, lumbar region with neurogenic claudication: Secondary | ICD-10-CM | POA: Diagnosis not present

## 2021-10-21 DIAGNOSIS — M5137 Other intervertebral disc degeneration, lumbosacral region: Secondary | ICD-10-CM | POA: Diagnosis present

## 2021-10-21 DIAGNOSIS — M5441 Lumbago with sciatica, right side: Secondary | ICD-10-CM | POA: Insufficient documentation

## 2021-10-21 DIAGNOSIS — R937 Abnormal findings on diagnostic imaging of other parts of musculoskeletal system: Secondary | ICD-10-CM | POA: Insufficient documentation

## 2021-10-21 DIAGNOSIS — M5442 Lumbago with sciatica, left side: Secondary | ICD-10-CM | POA: Diagnosis present

## 2021-10-21 DIAGNOSIS — F3342 Major depressive disorder, recurrent, in full remission: Secondary | ICD-10-CM

## 2021-10-21 DIAGNOSIS — G8929 Other chronic pain: Secondary | ICD-10-CM | POA: Diagnosis present

## 2021-10-21 DIAGNOSIS — M79604 Pain in right leg: Secondary | ICD-10-CM | POA: Insufficient documentation

## 2021-10-21 DIAGNOSIS — M47816 Spondylosis without myelopathy or radiculopathy, lumbar region: Secondary | ICD-10-CM | POA: Insufficient documentation

## 2021-10-21 MED ORDER — ROPIVACAINE HCL 2 MG/ML IJ SOLN
2.0000 mL | Freq: Once | INTRAMUSCULAR | Status: AC
  Administered 2021-10-21: 2 mL via EPIDURAL
  Filled 2021-10-21: qty 20

## 2021-10-21 MED ORDER — SODIUM CHLORIDE 0.9% FLUSH
2.0000 mL | Freq: Once | INTRAVENOUS | Status: AC
  Administered 2021-10-21: 2 mL

## 2021-10-21 MED ORDER — DULOXETINE HCL 60 MG PO CPEP: 60.0000 mg | ORAL_CAPSULE | Freq: Every day | ORAL | 0 refills | Status: DC

## 2021-10-21 MED ORDER — IOHEXOL 180 MG/ML  SOLN
10.0000 mL | Freq: Once | INTRAMUSCULAR | Status: AC
  Administered 2021-10-21: 10 mL via EPIDURAL
  Filled 2021-10-21: qty 20

## 2021-10-21 MED ORDER — TRIAMCINOLONE ACETONIDE 40 MG/ML IJ SUSP
40.0000 mg | Freq: Once | INTRAMUSCULAR | Status: AC
  Administered 2021-10-21: 40 mg
  Filled 2021-10-21: qty 1

## 2021-10-21 MED ORDER — PENTAFLUOROPROP-TETRAFLUOROETH EX AERO
INHALATION_SPRAY | Freq: Once | CUTANEOUS | Status: AC
  Administered 2021-10-21: 30 via TOPICAL

## 2021-10-21 MED ORDER — LIDOCAINE HCL 2 % IJ SOLN
20.0000 mL | Freq: Once | INTRAMUSCULAR | Status: AC
  Administered 2021-10-21: 100 mg
  Filled 2021-10-21: qty 40

## 2021-10-21 NOTE — Patient Instructions (Addendum)
____________________________________________________________________________________________  Virtual Visits   What is a "Virtual Visit"? It is a Mining engineer (medical visit) that takes place on real time (NOT TEXT or E-MAIL) over the telephone or computer device (desktop, laptop, tablet, smart phone, etc.). It allows for more location flexibility between the patient and the healthcare provider.  Who decides when these types of visits will be used? The physician.  Who is eligible for these types of visits? Only those patients that can be reliably reached over the telephone.  What do you mean by reliably? We do not have time to call everyone multiple times, therefore those that tend to screen calls and then call back later are not suitable candidates for this system. We understand how people are reluctant to pickup on "unknown" calls, therefore, we suggest adding our telephone numbers to your list of "CONTACT(s)". This way, you should be able to readily identify our calls when you receive one. All of our numbers are available below.   Who is not eligible? This option is not available for medication management encounters, specially for controlled substances. Patients on pain medications that fall under the category of controlled substances have to come in for "Face-to-Face" encounters. This is required for mandatory monitoring of these substances. You may be asked to provide a sample for an unannounced urine drug screening test (UDS), and we will need to count your pain pills. Not bringing your pills to be counted may result in no refill. Obviously, neither one of these can be done over the phone.  When will this type of visits be used? You can request a virtual visit whenever you are physically unable to attend a regular appointment. The decision will be made by the physician (or healthcare provider) on a case by case basis.   At what time will I be called? This is an  excellent question. The providers will try to call you whenever they have time available. Do not expect to be called at any specific time. The secretaries will assign you a time for your virtual visit appointment, but this is done simply to keep a list of those patients that need to be called, but not for the purpose of keeping a time schedule. Be advised that the call may come in anytime during the day, between the hours of 8:00 AM and 8::00 PM, depending on provider availability. We do understand that the system is not perfect. If you are unable to be available that day on a moments notice, then request an "in-person" appointment rather than a "virtual visit".  Can I request my medication visits to be "Virtual"? Yes you may request it, but the decision is entirely up to the healthcare provider. Control substances require specific monitoring that requires Face-to-Face encounters. The number of encounters  and the extent of the monitoring is determined on a case by case basis.  Add a new contact to your smart phone and label it "PAIN CLINIC" Under this contact add the following numbers: Main: [phone] (Official Contact Number) Nurses: [phone] (These are outgoing only calling systems. Do not call this number.) Dr. Laban Emperor: [phone] or [phone] (Outgoing calls only. Do not call this number.)  ____________________________________________________________________________________________    Pain Management Discharge Instructions  General Discharge Instructions :  If you need to reach your doctor call: Monday-Friday 8:00 am - 4:00 pm at [phone] or toll free [phone].  After clinic hours [phone] to have operator reach doctor.  Bring all of your medication bottles to  all your appointments in the pain clinic.  To cancel or reschedule your appointment with Pain Management please remember to call 24 hours in advance to avoid a fee.  Refer to the educational  materials which you have been given on: General Risks, I had my Procedure. Discharge Instructions, Post Sedation.  Post Procedure Instructions:  The drugs you were given will stay in your system until tomorrow, so for the next 24 hours you should not drive, make any legal decisions or drink any alcoholic beverages.  You may eat anything you prefer, but it is better to start with liquids then soups and crackers, and gradually work up to solid foods.  Please notify your doctor immediately if you have any unusual bleeding, trouble breathing or pain that is not related to your normal pain.  Depending on the type of procedure that was done, some parts of your body may feel week and/or numb.  This usually clears up by tonight or the next day.  Walk with the use of an assistive device or accompanied by an adult for the 24 hours.  You may use ice on the affected area for the first 24 hours.  Put ice in a Ziploc bag and cover with a towel and place against area 15 minutes on 15 minutes off.  You may switch to heat after 24 hours.Epidural Steroid Injection Patient Information  Description: The epidural space surrounds the nerves as they exit the spinal cord.  In some patients, the nerves can be compressed and inflamed by a bulging disc or a tight spinal canal (spinal stenosis).  By injecting steroids into the epidural space, we can bring irritated nerves into direct contact with a potentially helpful medication.  These steroids act directly on the irritated nerves and can reduce swelling and inflammation which often leads to decreased pain.  Epidural steroids may be injected anywhere along the spine and from the neck to the low back depending upon the location of your pain.   After numbing the skin with local anesthetic (like Novocaine), a small needle is passed into the epidural space slowly.  You may experience a sensation of pressure while this is being done.  The entire block usually last less than 10  minutes.  Conditions which may be treated by epidural steroids:  Low back and leg pain Neck and arm pain Spinal stenosis Post-laminectomy syndrome Herpes zoster (shingles) pain Pain from compression fractures  Preparation for the injection:  Do not eat any solid food or dairy products within 8 hours of your appointment.  You may drink clear liquids up to 3 hours before appointment.  Clear liquids include water, black coffee, juice or soda.  No milk or cream please. You may take your regular medication, including pain medications, with a sip of water before your appointment  Diabetics should hold regular insulin (if taken separately) and take 1/2 normal NPH dos the morning of the procedure.  Carry some sugar containing items with you to your appointment. A driver must accompany you and be prepared to drive you home after your procedure.  Bring all your current medications with your. An IV may be inserted and sedation may be given at the discretion of the physician.   A blood pressure cuff, EKG and other monitors will often be applied during the procedure.  Some patients may need to have extra oxygen administered for a short period. You will be asked to provide medical information, including your allergies, prior to the procedure.  We must  know immediately if you are taking blood thinners (like Coumadin/Warfarin)  Or if you are allergic to IV iodine contrast (dye). We must know if you could possible be pregnant.  Possible side-effects: Bleeding from needle site Infection (rare, may require surgery) Nerve injury (rare) Numbness & tingling (temporary) Difficulty urinating (rare, temporary) Spinal headache ( a headache worse with upright posture) Light -headedness (temporary) Pain at injection site (several days) Decreased blood pressure (temporary) Weakness in arm/leg (temporary) Pressure sensation in back/neck (temporary)  Call if you experience: Fever/chills associated with headache  or increased back/neck pain. Headache worsened by an upright position. New onset weakness or numbness of an extremity below the injection site Hives or difficulty breathing (go to the emergency room) Inflammation or drainage at the infection site Severe back/neck pain Any new symptoms which are concerning to you  Please note:  Although the local anesthetic injected can often make your back or neck feel good for several hours after the injection, the pain will likely return.  It takes 3-7 days for steroids to work in the epidural space.  You may not notice any pain relief for at least that one week.  If effective, we will often do a series of three injections spaced 3-6 weeks apart to maximally decrease your pain.  After the initial series, we generally will wait several months before considering a repeat injection of the same type.  If you have any questions, please call [phone] [facility]  ____________________________________________________________________________________________  Post-Procedure Discharge Instructions  Instructions: Apply ice:  Purpose: This will minimize any swelling and discomfort after procedure.  When: Day of procedure, as soon as you get home. How: Fill a plastic sandwich bag with crushed ice. Cover it with a small towel and apply to injection site. How long: (15 min on, 15 min off) Apply for 15 minutes then remove x 15 minutes.  Repeat sequence on day of procedure, until you go to bed. Apply heat:  Purpose: To treat any soreness and discomfort from the procedure. When: Starting the next day after the procedure. How: Apply heat to procedure site starting the day following the procedure. How long: May continue to repeat daily, until discomfort goes away. Food intake: Start with clear liquids (like water) and advance to regular food, as tolerated.  Physical activities: Keep activities to a minimum for the first 8 hours  after the procedure. After that, then as tolerated. Driving: If you have received any sedation, be responsible and do not drive. You are not allowed to drive for 24 hours after having sedation. Blood thinner: (Applies only to those taking blood thinners) You may restart your blood thinner 6 hours after your procedure. Insulin: (Applies only to Diabetic patients taking insulin) As soon as you can eat, you may resume your normal dosing schedule. Infection prevention: Keep procedure site clean and dry. Shower daily and clean area with soap and water. Post-procedure Pain Diary: Extremely important that this be done correctly and accurately. Recorded information will be used to determine the next step in treatment. For the purpose of accuracy, follow these rules: Evaluate only the area treated. Do not report or include pain from an untreated area. For the purpose of this evaluation, ignore all other areas of pain, except for the treated area. After your procedure, avoid taking a long nap and attempting to complete the pain diary after you wake up. Instead, set your alarm clock to go off every hour, on the hour, for the initial  8 hours after the procedure. Document the duration of the numbing medicine, and the relief you are getting from it. Do not go to sleep and attempt to complete it later. It will not be accurate. If you received sedation, it is likely that you were given a medication that may cause amnesia. Because of this, completing the diary at a later time may cause the information to be inaccurate. This information is needed to plan your care. Follow-up appointment: Keep your post-procedure follow-up evaluation appointment after the procedure (usually 2 weeks for most procedures, 6 weeks for radiofrequencies). DO NOT FORGET to bring you pain diary with you.   Expect: (What should I expect to see with my procedure?) From numbing medicine (AKA: Local Anesthetics): Numbness or decrease in pain. You may  also experience some weakness, which if present, could last for the duration of the local anesthetic. Onset: Full effect within 15 minutes of injected. Duration: It will depend on the type of local anesthetic used. On the average, 1 to 8 hours.  From steroids (Applies only if steroids were used): Decrease in swelling or inflammation. Once inflammation is improved, relief of the pain will follow. Onset of benefits: Depends on the amount of swelling present. The more swelling, the longer it will take for the benefits to be seen. In some cases, up to 10 days. Duration: Steroids will stay in the system x 2 weeks. Duration of benefits will depend on multiple posibilities including persistent irritating factors. Side-effects: If present, they may typically last 2 weeks (the duration of the steroids). Frequent: Cramps (if they occur, drink Gatorade and take over-the-counter Magnesium 450-500 mg once to twice a day); water retention with temporary weight gain; increases in blood sugar; decreased immune system response; increased appetite. Occasional: Facial flushing (red, warm cheeks); mood swings; menstrual changes. Uncommon: Long-term decrease or suppression of natural hormones; bone thinning. (These are more common with higher doses or more frequent use. This is why we prefer that our patients avoid having any injection therapies in other practices.)  Very Rare: Severe mood changes; psychosis; aseptic necrosis. From procedure: Some discomfort is to be expected once the numbing medicine wears off. This should be minimal if ice and heat are applied as instructed.  Call if: (When should I call?) You experience numbness and weakness that gets worse with time, as opposed to wearing off. New onset bowel or bladder incontinence. (Applies only to procedures done in the spine)  Emergency Numbers: Durning business hours (Monday - Thursday, 8:00 AM - 4:00 PM) (Friday, 9:00 AM - 12:00 Noon): [phone] After  hours: [phone] NOTE: If you are having a problem and are unable connect with, or to talk to a provider, then go to your nearest urgent care or emergency department. If the problem is serious and urgent, please call 911. ____________________________________________________________________________________________

## 2021-10-21 NOTE — Telephone Encounter (Signed)
Medication management - Telephone call with [facility] and then patient's Daughter, after she left a message patient is in need of a new Duloxetine order as pt will be out after 10/22/21.  Verified with [facility] pt still has a 90 day order on file and they will mail out today.  Collateral now aware this is being sent and requests a 7 day supply be sent to [facility] on [address] in [place] so she will not run out.  Collateral stated understanding this may be an out of pocket expense with the 90 day being covered by insurance and was fine with this.  Agreed to send request to Dr. Shea Evans for a 7 day supply to be sent to patient's local [facility] to bridge until 90 day refill received.

## 2021-10-21 NOTE — Progress Notes (Addendum)
PROVIDER NOTE: Interpretation of information contained herein should be left to medically-trained personnel. Specific patient instructions are provided elsewhere under "Patient Instructions" section of medical record. This document was created in part using STT-dictation technology, any transcriptional errors that may result from this process are unintentional.  Patient: Laura [name] Type: Established DOB: 01-24-1945 MRN: [id number] PCP: Adin Hector, MD  Service: Procedure DOS: 10/21/2021 Setting: Ambulatory Location: Ambulatory outpatient facility Delivery: Face-to-face Provider: Gaspar Cola, MD Specialty: Interventional Pain Management Specialty designation: 09 Location: Outpatient facility Ref. Prov.: Adin Hector, MD    Primary Reason for Visit: Interventional Pain Management Treatment. CC: Back Pain (lower)   Procedure:           Type: Lumbar epidural steroid injection (LESI) (interlaminar) #4    Laterality: Midline   Level:  L2-3 Level.  Imaging: Fluoroscopic guidance         Anesthesia: Local anesthesia (1-2% Lidocaine) Anxiolysis: None                 Sedation: No Sedation                       DOS: 10/21/2021  Performed by: Gaspar Cola, MD  Purpose: Diagnostic/Therapeutic Indications: Lumbar radicular pain of intraspinal etiology of more than 4 weeks that has failed to respond to conservative therapy and is severe enough to impact quality of life or function. 1. 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 DDD (degenerative disc disease), lumbosacral   3. Chronic low back pain (1ry area of Pain) (Bilateral) (L>R) w/ sciatica (Bilateral)   4. Chronic lower extremity pain (2ry area of Pain) (Bilateral)   5. Chronic radicular pain of lower extremity (Bilateral)   6. Lumbar lateral recess stenosis (Bilateral) (L2-3, L3-4, L4-5) (Severe: L3-4)   7. Lumbar Grade 1 Anterolisthesis of L3/L4 and L4/L5   8.  Lumbar foraminal stenosis (Bilateral L3-4 and L5-S1)   9. Lumbosacral radiculopathy at L5 (Left)   10. Lumbar spondylosis    Abnormal MRI, lumbar spine (04/30/2021 & 12/26/2019)    Chronic anticoagulation (Plaquenil)    Morbid obesity with BMI of 40.0-44.9, adult (HCC)    NAS-11 Pain score:   Pre-procedure: 7 /10   Post-procedure: 7 /10      Position / Prep / Materials:  Position: Prone w/ head of the table raised (slight reverse trendelenburg) to facilitate breathing.  Prep solution: DuraPrep (Iodine Povacrylex [0.7% available iodine] and Isopropyl Alcohol, 74% w/w) Prep Area: Entire Posterior Lumbar Region from lower scapular tip down to mid buttocks area and from flank to flank. Materials:  Tray: Epidural tray Needle(s):  Type: Epidural needle (Tuohy) Gauge (G):  17 Length: Long (20cm) Qty: 1  Pre-op H&P Assessment:  Laura [name] is a 76 y.o. (year old), female patient, seen today for interventional treatment. She  has a past surgical history that includes Abdominal hysterectomy; Cesarean section; Replacement total knee (Left); and Hip surgery (Right). Laura [name] has a current medication list which includes the following prescription(s): albuterol, aspirin ec, azelastine, vitamin d, doxepin, duloxetine, folic acid, hydroxychloroquine, hydroxyzine, infliximab, levothyroxine, loratadine, methotrexate, metoprolol succinate, multi-vitamins, ondansetron, oxycodone, oxycodone, [START ON 12/04/2021] oxycodone, rosuvastatin, spironolactone, and furosemide. Her primarily concern today is the Back Pain (lower)  Initial Vital Signs:  Pulse/HCG Rate: 70ECG Heart Rate: 83 Temp: (!) 97.4 F (36.3 C) Resp: 20 BP: (!) 154/99 SpO2: 100 %  BMI: Estimated body mass index  is 38.27 kg/m as calculated from the following:   Height as of this encounter: 5\' 5"  (1.651 m).   Weight as of this encounter: 230 lb (104.3 kg).  Risk Assessment: Allergies: Reviewed. She is allergic to bupropion, jardiance  [empagliflozin], and penicillins.  Allergy Precautions: None required Coagulopathies: Reviewed. None identified.  Blood-thinner therapy: None at this time Active Infection(s): Reviewed. None identified. Laura [name] is afebrile  Site Confirmation: Laura [name] was asked to confirm the procedure and laterality before marking the site Procedure checklist: Completed Consent: Before the procedure and under the influence of no sedative(s), amnesic(s), or anxiolytics, the patient was informed of the treatment options, risks and possible complications. To fulfill our ethical and legal obligations, as recommended by the American Medical Association's Code of Ethics, I have informed the patient of my clinical impression; the nature and purpose of the treatment or procedure; the risks, benefits, and possible complications of the intervention; the alternatives, including doing nothing; the risk(s) and benefit(s) of the alternative treatment(s) or procedure(s); and the risk(s) and benefit(s) of doing nothing. The patient was provided information about the general risks and possible complications associated with the procedure. These may include, but are not limited to: failure to achieve desired goals, infection, bleeding, organ or nerve damage, allergic reactions, paralysis, and death. In addition, the patient was informed of those risks and complications associated to Spine-related procedures, such as failure to decrease pain; infection (i.e.: Meningitis, epidural or intraspinal abscess); bleeding (i.e.: epidural hematoma, subarachnoid hemorrhage, or any other type of intraspinal or peri-dural bleeding); organ or nerve damage (i.e.: Any type of peripheral nerve, nerve root, or spinal cord injury) with subsequent damage to sensory, motor, and/or autonomic systems, resulting in permanent pain, numbness, and/or weakness of one or several areas of the body; allergic reactions; (i.e.: anaphylactic reaction); and/or  death. Furthermore, the patient was informed of those risks and complications associated with the medications. These include, but are not limited to: allergic reactions (i.e.: anaphylactic or anaphylactoid reaction(s)); adrenal axis suppression; blood sugar elevation that in diabetics may result in ketoacidosis or comma; water retention that in patients with history of congestive heart failure may result in shortness of breath, pulmonary edema, and decompensation with resultant heart failure; weight gain; swelling or edema; medication-induced neural toxicity; particulate matter embolism and blood vessel occlusion with resultant organ, and/or nervous system infarction; and/or aseptic necrosis of one or more joints. Finally, the patient was informed that Medicine is not an exact science; therefore, there is also the possibility of unforeseen or unpredictable risks and/or possible complications that may result in a catastrophic outcome. The patient indicated having understood very clearly. We have given the patient no guarantees and we have made no promises. Enough time was given to the patient to ask questions, all of which were answered to the patient's satisfaction. Ms. Moffit has indicated that she wanted to continue with the procedure. Attestation: I, the ordering provider, attest that I have discussed with the patient the benefits, risks, side-effects, alternatives, likelihood of achieving goals, and potential problems during recovery for the procedure that I have provided informed consent. Date  Time: 10/21/2021 11:51 AM  Pre-Procedure Preparation:  Monitoring: As per clinic protocol. Respiration, ETCO2, SpO2, BP, heart rate and rhythm monitor placed and checked for adequate function Safety Precautions: Patient was assessed for positional comfort and pressure points before starting the procedure. Time-out: I initiated and conducted the "Time-out" before starting the procedure, as per protocol. The  patient was asked to participate by confirming  the accuracy of the "Time Out" information. Verification of the correct person, site, and procedure were performed and confirmed by me, the nursing staff, and the patient. "Time-out" conducted as per Joint Commission's Universal Protocol (UP.01.01.01). Time: 1206  Description/Narrative of Procedure:          Target: Epidural space via interlaminar opening, initially targeting the lower laminar border of the superior vertebral body. Region: Lumbar Approach: Percutaneous paravertebral  Rationale (medical necessity): procedure needed and proper for the diagnosis and/or treatment of the patient's medical symptoms and needs. Procedural Technique Safety Precautions: Aspiration looking for blood return was conducted prior to all injections. At no point did we inject any substances, as a needle was being advanced. No attempts were made at seeking any paresthesias. Safe injection practices and needle disposal techniques used. Medications properly checked for expiration dates. SDV (single dose vial) medications used. Description of the Procedure: Protocol guidelines were followed. The procedure needle was introduced through the skin, ipsilateral to the reported pain, and advanced to the target area. Bone was contacted and the needle walked caudad, until the lamina was cleared. The epidural space was identified using "loss-of-resistance technique" with 2-3 ml of PF-NaCl (0.9% NSS), in a 5cc LOR glass syringe.  Vitals:   10/21/21 1150 10/21/21 1203 10/21/21 1208 10/21/21 1212  BP:  (!) 154/99 (!) 151/78 (!) 149/79  Pulse: 70     Resp: 20 18 16 18   Temp: (!) 97.4 F (36.3 C)     TempSrc: Temporal     SpO2: 100% 100% 99% 99%  Weight: 230 lb (104.3 kg)     Height: 5\' 5"  (1.651 m)       Start Time: 1206 hrs. End Time: 1212 hrs.  Imaging Guidance (Spinal):          Type of Imaging Technique: Fluoroscopy Guidance (Spinal) Indication(s): Assistance in needle  guidance and placement for procedures requiring needle placement in or near specific anatomical locations not easily accessible without such assistance. Exposure Time: Please see nurses notes. Contrast: Before injecting any contrast, we confirmed that the patient did not have an allergy to iodine, shellfish, or radiological contrast. Once satisfactory needle placement was completed at the desired level, radiological contrast was injected. Contrast injected under live fluoroscopy. No contrast complications. See chart for type and volume of contrast used. Fluoroscopic Guidance: I was personally present during the use of fluoroscopy. "Tunnel Vision Technique" used to obtain the best possible view of the target area. Parallax error corrected before commencing the procedure. "Direction-depth-direction" technique used to introduce the needle under continuous pulsed fluoroscopy. Once target was reached, antero-posterior, oblique, and lateral fluoroscopic projection used confirm needle placement in all planes. Images permanently stored in EMR. Interpretation: I personally interpreted the imaging intraoperatively. Adequate needle placement confirmed in multiple planes. Appropriate spread of contrast into desired area was observed. No evidence of afferent or efferent intravascular uptake. No intrathecal or subarachnoid spread observed. Permanent images saved into the patient's record.  Antibiotic Prophylaxis:   Anti-infectives (From admission, onward)    None      Indication(s): None identified  Post-operative Assessment:  Post-procedure Vital Signs:  Pulse/HCG Rate: 7072 Temp: (!) 97.4 F (36.3 C) Resp: 18 BP: (!) 149/79 SpO2: 99 %  EBL: None  Complications: No immediate post-treatment complications observed by team, or reported by patient.  Note: The patient tolerated the entire procedure well. A repeat set of vitals were taken after the procedure and the patient was kept under observation  following institutional policy, for  this type of procedure. Post-procedural neurological assessment was performed, showing return to baseline, prior to discharge. The patient was provided with post-procedure discharge instructions, including a section on how to identify potential problems. Should any problems arise concerning this procedure, the patient was given instructions to immediately contact us, at any time, without hesitation. In any case, we plan to contact the patient by telephone for a follow-up status report regarding this interventional procedure.  Comments:  No additional relevant information.  Plan of Care  Orders:  Orders Placed This Encounter  Procedures   Lumbar Epidural Injection    Scheduling Instructions:     Procedure: Interlaminar LESI L2-3     Laterality: Left     Sedation: Patient's choice     Timeframe: Today    Order Specific Question:   Where will this procedure be performed?    Answer:   [facility]   DG [facility] C-ARM 1-60 MIN NO REPORT    Intraoperative interpretation by procedural physician at [place].    Standing Status:   Standing    Number of Occurrences:   1    Order Specific Question:   Reason for exam:    Answer:   Assistance in needle guidance and placement for procedures requiring needle placement in or near specific anatomical locations not easily accessible without such assistance.   Informed Consent Details: Physician/Practitioner Attestation; Transcribe to consent form and obtain patient signature    Note: Always confirm laterality of pain with Ms. Myron, before procedure. Transcribe to consent form and obtain patient signature.    Order Specific Question:   Physician/Practitioner attestation of informed consent for procedure/surgical case    Answer:   I, the physician/practitioner, attest that I have discussed with the patient the benefits, risks, side effects, alternatives, likelihood of achieving goals and potential  problems during recovery for the procedure that I have provided informed consent.    Order Specific Question:   Procedure    Answer:   Lumbar epidural steroid injection under fluoroscopic guidance    Order Specific Question:   Physician/Practitioner performing the procedure    Answer:   Zaden Sako A. Dossie Arbour, MD    Order Specific Question:   Indication/Reason    Answer:   Low back and/or lower extremity pain secondary to lumbar radiculitis   Chronic Opioid Analgesic:  Oxycodone IR 5 mg every 6 hours (20 mg/day) MME/day: 30 mg/day.   Medications ordered for procedure: Meds ordered this encounter  Medications   iohexol (OMNIPAQUE) 180 MG/ML injection 10 mL    Must be Myelogram-compatible. If not available, you may substitute with a water-soluble, non-ionic, hypoallergenic, myelogram-compatible radiological contrast medium.   lidocaine (XYLOCAINE) 2 % (with pres) injection 400 mg   pentafluoroprop-tetrafluoroeth (GEBAUERS) aerosol   sodium chloride flush (NS) 0.9 % injection 2 mL   ropivacaine (PF) 2 mg/mL (0.2%) (NAROPIN) injection 2 mL   triamcinolone acetonide (KENALOG-40) injection 40 mg   Medications administered: We administered iohexol, lidocaine, pentafluoroprop-tetrafluoroeth, sodium chloride flush, ropivacaine (PF) 2 mg/mL (0.2%), and triamcinolone acetonide.  See the medical record for exact dosing, route, and time of administration.  Follow-up plan:   Return in about 2 weeks (around 11/04/2021) for Proc-day (T,Th), (VV), (PPE).       Interventional Therapies  Risk  Complexity Considerations:   Estimated body mass index is 38.27 kg/m as calculated from the following:   Height as of 12/22/20: 5\' 5"  (1.651 m).   Weight as of 12/22/20: 230 lb (104.3 kg).  PLAQUENIL Anticoagulation (Stop: 11 days)   Planned  Pending:   Therapeutic midline L2-3 LESI #4    Under consideration:   Diagnostic bilateral genicular NB  Diagnostic bilateral L3 and/or L5 TFESI   Completed:    Therapeutic right IA steroid knee injection x2 (10/15/2015)  Therapeutic right Hyalgan knee injection x1 (09/01/2016)  Therapeutic left lumbar facet block x1 (01/22/2015)  Therapeutic left IA hip injection x1 (03/17/2015)  Therapeutic left L4-5 LESI x1 (02/04/2020) (100/100/85/85)  Therapeutic midline L2-3 LESI x3 (12/22/2020) (95/95/95/95)    Therapeutic  Palliative (PRN) options:   Therapeutic right IA Hyalgan knee injections Palliative left IA hip joint injection  Diagnostic left lumbar facet MBB #2       Recent Visits Date Type Provider Dept  11/04/21 Office Visit Milinda Pointer, MD [facility]  10/21/21 Procedure visit Milinda Pointer, [facility]  09/27/21 Office Visit Milinda Pointer, MD [facility]  Showing recent visits within past 90 days and meeting all other requirements Future Appointments Date Type Provider Dept  12/22/21 Appointment Milinda Pointer, MD [facility]  Showing future appointments within next 90 days and meeting all other requirements  Disposition: Discharge home  Discharge (Date  Time): 10/21/2021; 1230 hrs.   Primary Care Physician: Adin Hector, MD Location: [facility] Note by: Gaspar Cola, MD Date: 10/21/2021; Time: 6:03 AM  Disclaimer:  Medicine is not an Chief Strategy Officer. The only guarantee in medicine is that nothing is guaranteed. It is important to note that the decision to proceed with this intervention was based on the information collected from the patient. The Data and conclusions were drawn from the patient's questionnaire, the interview, and the physical examination. Because the information was provided in large part by the patient, it cannot be guaranteed that it has not been purposely or unconsciously manipulated. Every effort has been made to obtain as much relevant data as possible for this evaluation. It is important to note that the conclusions  that lead to this procedure are derived in large part from the available data. Always take into account that the treatment will also be dependent on availability of resources and existing treatment guidelines, considered by other Pain Management Practitioners as being common knowledge and practice, at the time of the intervention. For Medico-Legal purposes, it is also important to point out that variation in procedural techniques and pharmacological choices are the acceptable norm. The indications, contraindications, technique, and results of the above procedure should only be interpreted and judged by a Board-Certified Interventional Pain Specialist with extensive familiarity and expertise in the same exact procedure and technique.

## 2021-10-21 NOTE — Telephone Encounter (Signed)
I have sent 7 days supply of Cymbalta to [facility] [address]

## 2021-10-21 NOTE — Telephone Encounter (Signed)
Medication management - Telephone call with patient's daughter to inform Dr. Shea Evans had sent in the requested 7 day supply of Duloxetine to their local [facility] to last until patient receives her 50 day order from [facility]. Collateral to call back if any problems filling orders.

## 2021-10-22 ENCOUNTER — Other Ambulatory Visit: Payer: Self-pay | Admitting: Psychiatry

## 2021-10-22 ENCOUNTER — Telehealth: Payer: Self-pay

## 2021-10-22 DIAGNOSIS — F3342 Major depressive disorder, recurrent, in full remission: Secondary | ICD-10-CM

## 2021-10-22 NOTE — Telephone Encounter (Signed)
Post procedure phone call.  LM 

## 2021-10-27 ENCOUNTER — Other Ambulatory Visit: Payer: Self-pay | Admitting: Psychiatry

## 2021-10-27 DIAGNOSIS — F3342 Major depressive disorder, recurrent, in full remission: Secondary | ICD-10-CM

## 2021-10-28 ENCOUNTER — Ambulatory Visit (INDEPENDENT_AMBULATORY_CARE_PROVIDER_SITE_OTHER): Payer: Medicare Other | Admitting: Vascular Surgery

## 2021-10-28 ENCOUNTER — Ambulatory Visit: Payer: Medicare Other | Admitting: Neurosurgery

## 2021-11-03 ENCOUNTER — Telehealth: Payer: Self-pay

## 2021-11-03 NOTE — Progress Notes (Unsigned)
Patient: Laura [name]  Service Category: E/M  Provider:  A , MD  DOB: 01/07/1946  DOS: 11/04/2021  Location: Office  MRN: [id number]  Setting: Ambulatory outpatient  Referring Provider: Klein, Bert J III, MD  Type: Established Patient  Specialty: Interventional Pain Management  PCP: Klein, Bert J III, MD  Location: Remote location  Delivery: TeleHealth     Virtual Encounter - Pain Management PROVIDER NOTE: Information contained herein reflects review and annotations entered in association with encounter. Interpretation of such information and data should be left to medically-trained personnel. Information provided to patient can be located elsewhere in the medical record under "Patient Instructions". Document created using STT-dictation technology, any transcriptional errors that may result from process are unintentional.    Contact & Pharmacy Preferred: [phone] Home: [phone] (home) Mobile: [phone] (mobile) E-mail: [email or web address]  [id number] - [place], [place] - [address] [place] [address] Phone: [phone] Fax: [phone]   Pre-screening  Ms. Zollner offered "in-person" vs "virtual" encounter. She indicated preferring virtual for this encounter.   Reason COVID-19*  Social distancing based on CDC and AMA recommendations.   I contacted Anays Stann on 11/04/2021 via telephone.      I clearly identified myself as  A , MD. I verified that I was speaking with the correct person using two identifiers (Name: Koraline Amyx, and date of birth: 04/04/1945).  Consent I sought verbal advanced consent from Nishtha Krichbaum for virtual visit interactions. I informed Ms. Maudlin of possible security and privacy concerns, risks, and limitations associated with providing "not-in-person" medical evaluation and management services. I also informed Ms. Eisinger of the availability of "in-person" appointments.  Finally, I informed her that there would be a charge for the virtual visit and that she could be  personally, fully or partially, financially responsible for it. Ms. Dorner expressed understanding and agreed to proceed.   Historic Elements   Ms. Jayonna Mura is a 76 y.o. year old, female patient evaluated today after our last contact on 10/21/2021. Ms. Pavlak  has a past medical history of Allergy, Anxiety, Arthritis, degenerative (10/08/2013), Asthma, Chronic hand pain, left (10/10/2019), Chronic kidney disease, Depression, Lumbar spinal stenosis with neurogenic claudication (11/07/2014), Major depression, single episode, in complete remission (HCC) (06/25/2015), Memory loss, short term (03/19/2014), Rheumatoid arthritis (HCC), Sciatica of left side (12/31/2019), Seizure (HCC) (10/07/2014), Sleep apnea, Sleep apnea, and Thyroid disease. She also  has a past surgical history that includes Abdominal hysterectomy; Cesarean section; Replacement total knee (Left); and Hip surgery (Right). Ms. Aldana has a current medication list which includes the following prescription(s): albuterol, aspirin ec, azelastine, vitamin d, doxepin, duloxetine, folic acid, furosemide, hydroxychloroquine, hydroxyzine, infliximab, levothyroxine, loratadine, methotrexate, metoprolol succinate, multi-vitamins, ondansetron, oxycodone, oxycodone, [START ON 12/04/2021] oxycodone, rosuvastatin, and spironolactone. She  reports that she has never smoked. She has never used smokeless tobacco. She reports that she does not drink alcohol and does not use drugs. Ms. Wilmer is allergic to bupropion, jardiance [empagliflozin], and penicillins.   HPI  Today, she is being contacted for a post-procedure assessment.  She indicates having attained an ongoing 80% improvement of her low back pain as well and has a 50% improvement on her right leg pain and numbness.  She refers that prior to the injection she was having a lot of numbness and  difficulty moving the leg and now she indicates not having any numbness and being able to move the leg better.  She has severe spinal stenosis based on her MRI   and in multiple locations I have reminded her that the only way that this is going to improve significantly is for her to have a surgery.  However, she keeps ignoring my recommendations and she wants to continue having shots for the pain.  I have again reminded her that I can continue helping her with those injections for as long as I can, but her spinal stenosis will eventually get worse and at that point may injections are going to be useless since they will not be able to relieve the compression.  She understood and accepted and again she said that she will continue having the injections for as long as she can.  Post-procedure evaluation   Type: Lumbar epidural steroid injection (LESI) (interlaminar) #4    Laterality: Midline   Level:  L2-3 Level.  Imaging: Fluoroscopic guidance         Anesthesia: Local anesthesia (1-2% Lidocaine) Anxiolysis: None                 Sedation: No Sedation                       DOS: 10/21/2021  Performed by:  A , MD  Purpose: Diagnostic/Therapeutic Indications: Lumbar radicular pain of intraspinal etiology of more than 4 weeks that has failed to respond to conservative therapy and is severe enough to impact quality of life or function. 1. 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 DDD (degenerative disc disease), lumbosacral   3. Chronic low back pain (1ry area of Pain) (Bilateral) (L>R) w/ sciatica (Bilateral)   4. Chronic lower extremity pain (2ry area of Pain) (Bilateral)   5. Chronic radicular pain of lower extremity (Bilateral)   6. Lumbar lateral recess stenosis (Bilateral) (L2-3, L3-4, L4-5) (Severe: L3-4)   7. Lumbar Grade 1 Anterolisthesis of L3/L4 and L4/L5   8. Lumbar foraminal stenosis (Bilateral L3-4 and L5-S1)   9. Lumbosacral  radiculopathy at L5 (Left)   10. Lumbar spondylosis    Abnormal MRI, lumbar spine (04/30/2021 & 12/26/2019)    Chronic anticoagulation (Plaquenil)    Morbid obesity with BMI of 40.0-44.9, adult (HCC)    NAS-11 Pain score:   Pre-procedure:  7/10   Post-procedure:  7/10      Effectiveness:  Initial hour after procedure: 0 %. Subsequent 4-6 hours post-procedure: 0 %. Analgesia past initial 6 hours: 50 % (right leg is moving much better after procedure.). Ongoing improvement:  Analgesic: The patient indicates having attained an ongoing 80% improvement of her low back pain as well as an ongoing 50% improvement of her right lower extremity pain, numbness, and weakness. Function: Ms. Dolezal reports improvement in function ROM: Ms. Guerrieri reports improvement in ROM  Pharmacotherapy Assessment   Opioid Analgesic: Oxycodone IR 5 mg every 6 hours (20 mg/day) MME/day: 30 mg/day.   Monitoring: [place] PMP: PDMP reviewed during this encounter.       Pharmacotherapy: No side-effects or adverse reactions reported. Compliance: No problems identified. Effectiveness: Clinically acceptable. Plan: Refer to "POC". UDS:  Summary  Date Value Ref Range Status  06/22/2021 Note  Final    Comment:    ==================================================================== [employer] Select 13 (MW) ==================================================================== Test                             Result       Flag       Units    Drug Present and Declared for Prescription Verification   Oxycodone                      1634         EXPECTED   ng/mg creat   Oxymorphone                    770          EXPECTED   ng/mg creat   Noroxycodone                   2933         EXPECTED   ng/mg creat   Noroxymorphone                 191          EXPECTED   ng/mg creat    Sources of oxycodone are scheduled prescription medications.    Oxymorphone, noroxycodone, and noroxymorphone are expected    metabolites of oxycodone.  Oxymorphone is also available as a    scheduled prescription medication.  ==================================================================== Test                      Result    Flag   Units      Ref Range   Creatinine              94               mg/dL      >=20 ==================================================================== Declared Medications:  The flagging and interpretation on this report are based on the  following declared medications.  Unexpected results may arise from  inaccuracies in the declared medications.   **Note: The testing scope of this panel includes these medications:   Oxycodone (OxyIR)   **Note: The testing scope of this panel does not include the  following reported medications:   Albuterol (Ventolin HFA)  Aspirin  Azelastine (Astelin)  Duloxetine (Cymbalta)  Folic Acid  Furosemide (Lasix)  Hydroxychloroquine (Plaquenil)  Hydroxyzine (Vistaril)  Infliximab (Remicade)  Levothyroxine (Synthroid)  Loratadine (Claritin)  Methotrexate  Metoprolol (Toprol)  Multivitamin  Ondansetron (Zofran)  Rosuvastatin (Crestor)  Spironolactone (Aldactone)  Trazodone (Desyrel)  Vitamin D ==================================================================== For clinical consultation, please call [phone]. ====================================================================    No results found for: "CBDTHCR", "D8THCCBX", "D9THCCBX"   Laboratory Chemistry Profile   Renal Lab Results  Component Value Date   BUN 34 (H) 02/15/2021   CREATININE 1.37 (H) 02/15/2021   GFRAA 57 (L) 10/03/2019   GFRNONAA 40 (L) 02/15/2021    Hepatic Lab Results  Component Value Date   AST 20 01/14/2021   ALT 11 01/14/2021   ALBUMIN 4.0 01/14/2021   ALKPHOS 55 01/14/2021   LIPASE 22 01/14/2021    Electrolytes Lab Results  Component Value Date   NA 132 (L) 02/15/2021   K 4.2 02/15/2021   CL 95 (L) 02/15/2021   CALCIUM 9.4 02/15/2021   MG 2.3 10/08/2018     Bone Lab Results  Component Value Date   25OHVITD1 36 07/15/2015   25OHVITD2 3.6 07/15/2015   25OHVITD3 32 07/15/2015    Inflammation (CRP: Acute Phase) (ESR: Chronic Phase) Lab Results  Component Value Date   CRP 1.9 (H) 07/15/2015   ESRSEDRATE 32 (H) 07/15/2015   LATICACIDVEN 1.4 10/07/2018         Note: Above Lab results reviewed.  Imaging  DG PAIN CLINIC C-ARM 1-60 MIN NO REPORT Fluoro was   used, but no Radiologist interpretation will be provided.  Please refer to "NOTES" tab for provider progress note.  Assessment  The primary encounter diagnosis was Chronic low back pain (1ry area of Pain) (Bilateral) (L>R) w/ sciatica (Bilateral). Diagnoses of Chronic radicular pain of lower extremity (Bilateral), Lumbar spinal stenosis (5 mm Severe L3-4; 8 mm L4-5) w/ neurogenic claudication, Lumbar lateral recess stenosis (Bilateral) (L2-3, L3-4, L4-5) (Severe: L3-4), Lumbar foraminal stenosis (Bilateral L3-4 and L5-S1), Lumbar Grade 1 Anterolisthesis of L3/L4 and L4/L5, DDD (degenerative disc disease), lumbosacral, and Abnormal MRI, lumbar spine (04/30/2021 & 12/26/2019) were also pertinent to this visit.  Plan of Care  Problem-specific:  No problem-specific Assessment & Plan notes found for this encounter.  Ms. Sai Moura has a current medication list which includes the following long-term medication(s): albuterol, azelastine, doxepin, duloxetine, infliximab, levothyroxine, loratadine, metoprolol succinate, oxycodone, oxycodone, [START ON 12/04/2021] oxycodone, and spironolactone.  Pharmacotherapy (Medications Ordered): No orders of the defined types were placed in this encounter.  Orders:  No orders of the defined types were placed in this encounter.  Follow-up plan:   No follow-ups on file.     Interventional Therapies  Risk  Complexity Considerations:   Estimated body mass index is 38.27 kg/m as calculated from the following:   Height as of 12/22/20: 5' 5" (1.651 m).    Weight as of 12/22/20: 230 lb (104.3 kg). PLAQUENIL Anticoagulation (Stop: 11 days)   Planned  Pending:   Therapeutic midline L2-3 LESI #4    Under consideration:   Diagnostic bilateral genicular NB  Diagnostic bilateral L3 and/or L5 TFESI   Completed:   Therapeutic right IA steroid knee injection x2 (10/15/2015)  Therapeutic right Hyalgan knee injection x1 (09/01/2016)  Therapeutic left lumbar facet block x1 (01/22/2015)  Therapeutic left IA hip injection x1 (03/17/2015)  Therapeutic left L4-5 LESI x1 (02/04/2020) (100/100/85/85)  Therapeutic midline L2-3 LESI x3 (12/22/2020) (95/95/95/95)    Therapeutic  Palliative (PRN) options:   Therapeutic right IA Hyalgan knee injections Palliative left IA hip joint injection  Diagnostic left lumbar facet MBB #2        Recent Visits Date Type Provider Dept  10/21/21 Procedure visit Milinda Pointer, MD [facility]  09/27/21 Office Visit Milinda Pointer, MD [facility]  Showing recent visits within past 90 days and meeting all other requirements Today's Visits Date Type Provider Dept  11/04/21 Office Visit Milinda Pointer, MD [facility]  Showing today's visits and meeting all other requirements Future Appointments Date Type Provider Dept  12/22/21 Appointment Milinda Pointer, MD [facility]  Showing future appointments within next 90 days and meeting all other requirements  I discussed the assessment and treatment plan with the patient. The patient was provided an opportunity to ask questions and all were answered. The patient agreed with the plan and demonstrated an understanding of the instructions.  Patient advised to call back or seek an in-person evaluation if the symptoms or condition worsens.  Duration of encounter: 15 minutes.  Note by: Gaspar Cola, MD Date: 11/04/2021; Time: 4:13 PM

## 2021-11-03 NOTE — Telephone Encounter (Signed)
LM  to call office for pre virtual appointment questions 

## 2021-11-04 ENCOUNTER — Ambulatory Visit: Payer: Medicare Other | Attending: Pain Medicine | Admitting: Pain Medicine

## 2021-11-04 ENCOUNTER — Other Ambulatory Visit: Payer: Self-pay | Admitting: Psychiatry

## 2021-11-04 DIAGNOSIS — M5441 Lumbago with sciatica, right side: Secondary | ICD-10-CM | POA: Diagnosis not present

## 2021-11-04 DIAGNOSIS — M5410 Radiculopathy, site unspecified: Secondary | ICD-10-CM | POA: Diagnosis not present

## 2021-11-04 DIAGNOSIS — R937 Abnormal findings on diagnostic imaging of other parts of musculoskeletal system: Secondary | ICD-10-CM

## 2021-11-04 DIAGNOSIS — M48062 Spinal stenosis, lumbar region with neurogenic claudication: Secondary | ICD-10-CM

## 2021-11-04 DIAGNOSIS — M4316 Spondylolisthesis, lumbar region: Secondary | ICD-10-CM

## 2021-11-04 DIAGNOSIS — M5137 Other intervertebral disc degeneration, lumbosacral region: Secondary | ICD-10-CM

## 2021-11-04 DIAGNOSIS — G8929 Other chronic pain: Secondary | ICD-10-CM

## 2021-11-04 DIAGNOSIS — M5442 Lumbago with sciatica, left side: Secondary | ICD-10-CM

## 2021-11-04 DIAGNOSIS — M48061 Spinal stenosis, lumbar region without neurogenic claudication: Secondary | ICD-10-CM

## 2021-11-04 DIAGNOSIS — F3342 Major depressive disorder, recurrent, in full remission: Secondary | ICD-10-CM

## 2021-11-04 NOTE — Patient Instructions (Signed)
  ____________________________________________________________________________________________  Patient Information update  To: All of our patients.  Re: Name change.  It has been made official that our current name, "[facility]"   will soon be changed to "[facility]".   The purpose of this change is to eliminate any confusion created by the concept of our practice being a "Medication Management Pain Clinic". In the past this has led to the misconception that we treat pain primarily by the use of prescription medications.  Nothing can be farther from the truth.   Understanding PAIN MANAGEMENT: To further understand what our practice does, you first have to understand that "Pain Management" is a subspecialty that requires additional training once a physician has completed their specialty training, which can be in either Anesthesia, Neurology, Psychiatry, or Physical Medicine and Rehabilitation (PMR). Each one of these contributes to the final approach taken by each physician to the management of their patient's pain. To be a "Pain Management Specialist" you must have first completed one of the specialty trainings below.  Anesthesiologists - trained in clinical pharmacology and interventional techniques such as nerve blockade and regional as well as central neuroanatomy. They are trained to block pain before, during, and after surgical interventions.  Neurologists - trained in the diagnosis and pharmacological treatment of complex neurological conditions, such as Multiple Sclerosis, Parkinson's, spinal cord injuries, and other systemic conditions that may be associated with symptoms that may include but are not limited to pain. They tend to rely primarily on the treatment of chronic pain using prescription medications.  Psychiatrist - trained in conditions affecting the psychosocial  wellbeing of patients including but not limited to depression, anxiety, schizophrenia, personality disorders, addiction, and other substance use disorders that may be associated with chronic pain. They tend to rely primarily on the treatment of chronic pain using prescription medications.   Physical Medicine and Rehabilitation (PMR) physicians, also known as physiatrists - trained to treat a wide variety of medical conditions affecting the brain, spinal cord, nerves, bones, joints, ligaments, muscles, and tendons. Their training is primarily aimed at treating patients that have suffered injuries that have caused severe physical impairment. Their training is primarily aimed at the physical therapy and rehabilitation of those patients. They may also work alongside orthopedic surgeons or neurosurgeons using their expertise in assisting surgical patients to recover after their surgeries.  INTERVENTIONAL PAIN MANAGEMENT is sub-subspecialty of Pain Management.  Our physicians are Board-certified in Anesthesia, Pain Management, and Interventional Pain Management.  This meaning that not only have they been trained and Board-certified in their specialty of Anesthesia, and subspecialty of Pain Management, but they have also received further training in the sub-subspecialty of Interventional Pain Management, in order to become Board-certified as INTERVENTIONAL PAIN MANAGEMENT SPECIALIST.    Mission: Our goal is to use our skills in  INTERVENTIONAL PAIN MANAGEMENT as alternatives to the chronic use of prescription opioid medications for the treatment of pain. To make this more clear, we have changed our name to reflect what we do and offer. We will continue to offer medication management assessment and recommendations, but we will not be taking over any patient's medication management.  ____________________________________________________________________________________________     

## 2021-11-08 ENCOUNTER — Encounter (INDEPENDENT_AMBULATORY_CARE_PROVIDER_SITE_OTHER): Payer: Self-pay

## 2021-11-23 ENCOUNTER — Telehealth: Payer: Self-pay | Admitting: Pain Medicine

## 2021-11-23 NOTE — Telephone Encounter (Signed)
Spoke with patient's daughter, informed her that, as far as I can tell, our office did not call Ms. Graw.

## 2021-11-23 NOTE — Telephone Encounter (Signed)
PT daughter called stated that her mother had two missed call from the office on 11-22-21. She was returning the calls. Daughter asked to be call back. Thanks

## 2021-12-06 ENCOUNTER — Telehealth: Payer: Self-pay

## 2021-12-06 NOTE — Telephone Encounter (Signed)
-----   Message from Rockey Situ sent at 12/06/2021  8:05 AM EST ----- Regarding: FYI reporting fall Voice mail 12/03/2021 Loraine Leriche physical therapist from [facility] Reporting fall from 11/26/21, patient slide off her bed with no injury. Hoyle lift used to get her back on the bed. You can call him if you have questions at [phone] or call the patient.

## 2021-12-08 ENCOUNTER — Telehealth: Payer: Self-pay

## 2021-12-08 NOTE — Telephone Encounter (Signed)
[facility] [name] w/ [name] contacted me requesting to to continue PT under maintenance for her mobility, strength, and foot drop. Also requesting to restart OT for her left hand trigger finger and grip issues - reviewed scanned orders, Dr Myer Haff previously signed for this and she was discharged but is wanting to restart. Notified Laura [name] that it is OK to restart PT and OT. [name] is scheduled for follow up with Dr Myer Haff on 12/16/21.

## 2021-12-14 ENCOUNTER — Ambulatory Visit: Payer: Medicare Other | Admitting: Psychiatry

## 2021-12-15 ENCOUNTER — Ambulatory Visit: Payer: Medicare Other | Admitting: Psychiatry

## 2021-12-16 ENCOUNTER — Ambulatory Visit: Payer: Medicare Other | Admitting: Neurosurgery

## 2021-12-22 ENCOUNTER — Encounter: Payer: Self-pay | Admitting: Pain Medicine

## 2021-12-22 ENCOUNTER — Ambulatory Visit: Payer: Medicare Other | Attending: Pain Medicine | Admitting: Pain Medicine

## 2021-12-22 VITALS — BP 123/67 | HR 85 | Temp 97.2°F | Resp 16 | Ht 65.0 in | Wt 245.0 lb

## 2021-12-22 DIAGNOSIS — M79642 Pain in left hand: Secondary | ICD-10-CM | POA: Insufficient documentation

## 2021-12-22 DIAGNOSIS — M25552 Pain in left hip: Secondary | ICD-10-CM | POA: Diagnosis present

## 2021-12-22 DIAGNOSIS — G8929 Other chronic pain: Secondary | ICD-10-CM

## 2021-12-22 DIAGNOSIS — M79604 Pain in right leg: Secondary | ICD-10-CM | POA: Insufficient documentation

## 2021-12-22 DIAGNOSIS — M797 Fibromyalgia: Secondary | ICD-10-CM | POA: Diagnosis present

## 2021-12-22 DIAGNOSIS — Z79899 Other long term (current) drug therapy: Secondary | ICD-10-CM

## 2021-12-22 DIAGNOSIS — M0600 Rheumatoid arthritis without rheumatoid factor, unspecified site: Secondary | ICD-10-CM | POA: Diagnosis present

## 2021-12-22 DIAGNOSIS — M48062 Spinal stenosis, lumbar region with neurogenic claudication: Secondary | ICD-10-CM

## 2021-12-22 DIAGNOSIS — M5137 Other intervertebral disc degeneration, lumbosacral region: Secondary | ICD-10-CM | POA: Insufficient documentation

## 2021-12-22 DIAGNOSIS — M25561 Pain in right knee: Secondary | ICD-10-CM | POA: Diagnosis present

## 2021-12-22 DIAGNOSIS — M4316 Spondylolisthesis, lumbar region: Secondary | ICD-10-CM

## 2021-12-22 DIAGNOSIS — Z79891 Long term (current) use of opiate analgesic: Secondary | ICD-10-CM | POA: Diagnosis present

## 2021-12-22 DIAGNOSIS — M2428 Disorder of ligament, vertebrae: Secondary | ICD-10-CM

## 2021-12-22 DIAGNOSIS — M5441 Lumbago with sciatica, right side: Secondary | ICD-10-CM | POA: Diagnosis present

## 2021-12-22 DIAGNOSIS — G894 Chronic pain syndrome: Secondary | ICD-10-CM

## 2021-12-22 DIAGNOSIS — R937 Abnormal findings on diagnostic imaging of other parts of musculoskeletal system: Secondary | ICD-10-CM

## 2021-12-22 DIAGNOSIS — M47816 Spondylosis without myelopathy or radiculopathy, lumbar region: Secondary | ICD-10-CM

## 2021-12-22 DIAGNOSIS — M79605 Pain in left leg: Secondary | ICD-10-CM | POA: Insufficient documentation

## 2021-12-22 DIAGNOSIS — M5442 Lumbago with sciatica, left side: Secondary | ICD-10-CM | POA: Diagnosis present

## 2021-12-22 DIAGNOSIS — M51379 Other intervertebral disc degeneration, lumbosacral region without mention of lumbar back pain or lower extremity pain: Secondary | ICD-10-CM

## 2021-12-22 DIAGNOSIS — M48061 Spinal stenosis, lumbar region without neurogenic claudication: Secondary | ICD-10-CM | POA: Diagnosis present

## 2021-12-22 DIAGNOSIS — M159 Polyosteoarthritis, unspecified: Secondary | ICD-10-CM

## 2021-12-22 DIAGNOSIS — I2510 Atherosclerotic heart disease of native coronary artery without angina pectoris: Secondary | ICD-10-CM

## 2021-12-22 MED ORDER — OXYCODONE HCL 5 MG PO TABS: 5.0000 mg | ORAL_TABLET | Freq: Four times a day (QID) | ORAL | 0 refills | Status: DC | PRN

## 2021-12-22 MED ORDER — NALOXONE HCL 4 MG/0.1ML NA LIQD: 1.0000 | NASAL | 0 refills | Status: DC | PRN

## 2021-12-22 NOTE — Patient Instructions (Signed)
____________________________________________________________________________________________  Patient Information update  To: All of our patients.  Re: Name change.  It has been made official that our current name, "[facility]"   will soon be changed to "[facility]".   The purpose of this change is to eliminate any confusion created by the concept of our practice being a "Medication Management Pain Clinic". In the past this has led to the misconception that we treat pain primarily by the use of prescription medications.  Nothing can be farther from the truth.   Understanding PAIN MANAGEMENT: To further understand what our practice does, you first have to understand that "Pain Management" is a subspecialty that requires additional training once a physician has completed their specialty training, which can be in either Anesthesia, Neurology, Psychiatry, or Physical Medicine and Rehabilitation (PMR). Each one of these contributes to the final approach taken by each physician to the management of their patient's pain. To be a "Pain Management Specialist" you must have first completed one of the specialty trainings below.  Anesthesiologists - trained in clinical pharmacology and interventional techniques such as nerve blockade and regional as well as central neuroanatomy. They are trained to block pain before, during, and after surgical interventions.  Neurologists - trained in the diagnosis and pharmacological treatment of complex neurological conditions, such as Multiple Sclerosis, Parkinson's, spinal cord injuries, and other systemic conditions that may be associated with symptoms that may include but are not limited to pain. They tend to rely primarily on the treatment of chronic pain using prescription medications.  Psychiatrist - trained in conditions affecting the psychosocial  wellbeing of patients including but not limited to depression, anxiety, schizophrenia, personality disorders, addiction, and other substance use disorders that may be associated with chronic pain. They tend to rely primarily on the treatment of chronic pain using prescription medications.   Physical Medicine and Rehabilitation (PMR) physicians, also known as physiatrists - trained to treat a wide variety of medical conditions affecting the brain, spinal cord, nerves, bones, joints, ligaments, muscles, and tendons. Their training is primarily aimed at treating patients that have suffered injuries that have caused severe physical impairment. Their training is primarily aimed at the physical therapy and rehabilitation of those patients. They may also work alongside orthopedic surgeons or neurosurgeons using their expertise in assisting surgical patients to recover after their surgeries.  INTERVENTIONAL PAIN MANAGEMENT is sub-subspecialty of Pain Management.  Our physicians are Board-certified in Anesthesia, Pain Management, and Interventional Pain Management.  This meaning that not only have they been trained and Board-certified in their specialty of Anesthesia, and subspecialty of Pain Management, but they have also received further training in the sub-subspecialty of Interventional Pain Management, in order to become Board-certified as INTERVENTIONAL PAIN MANAGEMENT SPECIALIST.    Mission: Our goal is to use our skills in  INTERVENTIONAL PAIN MANAGEMENT as alternatives to the chronic use of prescription opioid medications for the treatment of pain. To make this more clear, we have changed our name to reflect what we do and offer. We will continue to offer medication management assessment and recommendations, but we will not be taking over any patient's medication management.  ____________________________________________________________________________________________      ____________________________________________________________________________________________  National Pain Medication Shortage  The [place] is experiencing worsening drug shortages. These have had a negative widespread effect on patient care and treatment. Not expected to improve any time soon. Predicted to last past 2029.   Drug shortage list (generic   names) Oxycodone IR Oxycodone/APAP Oxymorphone IR Hydromorphone Hydrocodone/APAP Morphine  Where is the problem?  Manufacturing and supply level.  Will this shortage affect you?  Only if you take any of the above pain medications.  How? You may be unable to fill your prescription.  Your pharmacist may offer a "partial fill" of your prescription. (Warning: Do not accept partial fills.) Prescriptions partially filled cannot be transferred to another pharmacy. Read our Medication Rules and Regulation. Depending on how much medicine you are dependent on, you may experience withdrawals when unable to get the medication.  Recommendations: Consider ending your dependence on opioid pain medications. Ask your pain specialist to assist you with the process. Consider switching to a medication currently not in shortage, such as Buprenorphine. Talk to your pain specialist about this option. Consider decreasing your pain medication requirements by managing tolerance thru "Drug Holidays". This may help minimize withdrawals, should you run out of medicine. Control your pain thru the use of non-pharmacological interventional therapies.   Your prescriber: Prescribers cannot be blamed for shortages. Medication manufacturing and supply issues cannot be fixed by the prescriber.   NOTE: The prescriber is not responsible for supplying the medication, or solving supply issues. Work with your pharmacist to solve it. The patient is responsible for the decision to take or continue taking the medication and for identifying and securing a legal supply source. By  law, supplying the medication is the job and responsibility of the pharmacy. The prescriber is responsible for the evaluation, monitoring, and prescribing of these medications.   Prescribers will NOT: Re-issue prescriptions that have been partially filled. Re-issue prescriptions already sent to a pharmacy.  Re-send prescriptions to a different pharmacy because yours did not have your medication. Ask pharmacist to order more medicine or transfer the prescription to another pharmacy. (Read below.)  New 2023 regulation: "September 10, 2021 Revised Regulation Allows DEA-Registered Pharmacies to Transfer Electronic Prescriptions at a Patient's Request DEA Headquarters Division - Public Information Office Patients now have the ability to request their electronic prescription be transferred to another pharmacy without having to go back to their practitioner to initiate the request. This revised regulation went into effect on Monday, September 06, 2021.     At a patient's request, a DEA-registered retail pharmacy can now transfer an electronic prescription for a controlled substance (schedules II-V) to another DEA-registered retail pharmacy. Prior to this change, patients would have to go through their practitioner to cancel their prescription and have it re-issued to a different pharmacy. The process was taxing and time consuming for both patients and practitioners.    The Drug Enforcement Administration (DEA) published its intent to revise the process for transferring electronic prescriptions on November 29, 2019.  The final rule was published in the federal register on August 05, 2021 and went into effect 30 days later.  Under the final rule, a prescription can only be transferred once between pharmacies, and only if allowed under existing state or other applicable law. The prescription must remain in its electronic form; may not be altered in any way; and the transfer must be communicated directly between  two licensed pharmacists. It's important to note, any authorized refills transfer with the original prescription, which means the entire prescription will be filled at the same pharmacy".  Reference: https://www.dea.gov/stories/2023/2023-09/2021-09-01/revised-regulation-allows-dea-registered-pharmacies-transfer (DEA website announcement)  https://www.govinfo.gov/content/pkg/FR-2021-08-05/pdf/2023-15847.pdf (Federal Register  Department of Justice)   Federal Register / Vol. 88, No. 143 / Thursday, August 05, 2021 / Rules and Regulations DEPARTMENT OF JUSTICE  Drug Enforcement   Administration  21 CFR Part 1306  [Docket No. [id number]]  RIN 1117-AB64 Transfer of Electronic Prescriptions for Schedules II-V Controlled Substances Between Pharmacies for Initial Filling  ____________________________________________________________________________________________     ____________________________________________________________________________________________  Drug Holidays  What is a "Drug Holiday"? Drug Holiday: is the name given to the process of slowly tapering down and temporarily stopping the pain medication for the purpose of decreasing or eliminating tolerance to the drug.  Benefits Improved effectiveness Decreased required effective dose Improved pain control End dependence on high dose therapy Decrease cost of therapy Uncovering "opioid-induced hyperalgesia". (OIH)  What is "opioid hyperalgesia"? It is a paradoxical increase in pain caused by exposure to opioids. Stopping the opioid pain medication, contrary to the expected, it actually decreases or completely eliminates the pain. Ref.: "A comprehensive review of opioid-induced hyperalgesia". Marion Lee, et.al. Pain Physician. 2011 Mar-Apr;14(2):145-61.  What is tolerance? Tolerance: the progressive loss of effectiveness of a pain medicine due to repetitive use. A common problem of opioid pain medications.  How long should a "Drug  Holiday" last? Effectiveness depends on the patient staying off all opioid pain medicines for a minimum of 14 consecutive days. (2 weeks)  How about just taking less of the medicine? Does not work. Will not accomplish goal of eliminating the excess receptors.  How about switching to a different pain medicine? (AKA. "Opioid rotation") Does not work. Creates the illusion of effectiveness by taking advantage of inaccurate equivalent dose calculations between different opioids. -This "technique" was promoted by studies funded by pharmaceutical companies, such as [employer], creators of "OxyContin".  Can I stop the medicine "cold turkey"? Depends. You should always coordinate with your Pain Specialist to make the transition as smoothly as possible. Avoid stopping the medicine abruptly without consulting. We recommend a "slow taper".  What is a slow taper? Taper: refers to the gradual decrease in dose.   How do I stop/taper the dose? Slowly. Decrease the daily amount of pills that you take by one (1) pill every seven (7) days. This is called a "slow downward taper". Example: if you normally take four (4) pills per day, drop it to three (3) pills per day for seven (7) days, then to two (2) pills per day for seven (7) days, then to one (1) per day for seven (7) days, and then stop the medicine. The 14 day "Drug Holiday" starts on the first day without medicine.   Will I experience withdrawals? Unlikely with a slow taper.  What triggers withdrawals? Withdrawals are triggered by the sudden/abrupt stop of high dose opioids. Withdrawals can be avoided by slowly decreasing the dose over a prolonged period of time.  What are withdrawals? Symptoms associated with sudden/abrupt reduction/stopping of high-dose, long-term use of pain medication. Withdrawal are seldom seen on low dose therapy, or patients rarely taking opioid medication.  Early Withdrawal Symptoms may include: Agitation Anxiety Muscle  aches Increased tearing Insomnia Runny nose Sweating Yawning  Late symptoms may include: Abdominal cramping Diarrhea Dilated pupils Goose bumps Nausea Vomiting  (Last update: 12/19/2021) ____________________________________________________________________________________________    _______________________________________________________________________  Medication Rules  Purpose: To inform patients, and their family members, of our medication rules and regulations.  Applies to: All patients receiving prescriptions from our practice (written or electronic).  Pharmacy of record: This is the pharmacy where your electronic prescriptions will be sent. Make sure we have the correct one.  Electronic prescriptions: In compliance with the [place] Strengthen Opioid Misuse Prevention (STOP) Act of 2017 (Session Law 2017-74/H243), effective January 10, 2018, all controlled substances must be electronically prescribed. Written prescriptions, faxing, or calling prescriptions to a pharmacy will no longer be done.  Prescription refills: These will be provided only during in-person appointments. No medications will be renewed without a "face-to-face" evaluation with your provider. Applies to all prescriptions.  NOTE: The following applies primarily to controlled substances (Opioid* Pain Medications).   Type of encounter (visit): For patients receiving controlled substances, face-to-face visits are required. (Not an option and not up to the patient.)  Patient's responsibilities: Pain Pills: Bring all pain pills to every appointment (except for procedure appointments). Pill Bottles: Bring pills in original pharmacy bottle. Bring bottle, even if empty. Always bring the bottle of the most recent fill.  Medication refills: You are responsible for knowing and keeping track of what medications you are taking and when is it that you will need a refill. The day before your appointment: write a  list of all prescriptions that need to be refilled. The day of the appointment: give the list to the admitting nurse. Prescriptions will be written only during appointments. No prescriptions will be written on procedure days. If you forget a medication: it will not be "Called in", "Faxed", or "electronically sent". You will need to get another appointment to get these prescribed. No early refills. Do not call asking to have your prescription filled early. Partial  or short prescriptions: Occasionally your pharmacy may not have enough pills to fill your prescription.  NEVER ACCEPT a partial fill or a prescription that is short of the total amount of pills that you were prescribed.  With controlled substances the law allows 72 hours for the pharmacy to complete the prescription.  If the prescription is not completed within 72 hours, the pharmacist will require a new prescription to be written. This means that you will be short on your medicine and we WILL NOT send another prescription to complete your original prescription.  Instead, request the pharmacy to send a carrier to a nearby branch to get enough medication to provide you with your full prescription. Prescription Accuracy: You are responsible for carefully inspecting your prescriptions before leaving our office. Have the discharge nurse carefully go over each prescription with you, before taking them home. Make sure that your name is accurately spelled, that your address is correct. Check the name and dose of your medication to make sure it is accurate. Check the number of pills, and the written instructions to make sure they are clear and accurate. Make sure that you are given enough medication to last until your next medication refill appointment. Taking Medication: Take medication as prescribed. When it comes to controlled substances, taking less pills or less frequently than prescribed is permitted and encouraged. Never take more pills than  instructed. Never take the medication more frequently than prescribed.  Inform other Doctors: Always inform, all of your healthcare providers, of all the medications you take. Pain Medication from other Providers: You are not allowed to accept any additional pain medication from any other Doctor or Healthcare provider. There are two exceptions to this rule. (see below) In the event that you require additional pain medication, you are responsible for notifying us, as stated below. Cough Medicine: Often these contain an opioid, such as codeine or hydrocodone. Never accept or take cough medicine containing these opioids if you are already taking an opioid* medication. The combination may cause respiratory failure and death. Medication Agreement: You are responsible for carefully reading and following our Medication Agreement. This   must be signed before receiving any prescriptions from our practice. Safely store a copy of your signed Agreement. Violations to the Agreement will result in no further prescriptions. (Additional copies of our Medication Agreement are available upon request.) Laws, Rules, & Regulations: All patients are expected to follow all Federal and State Laws, Statutes, Rules, & Regulations. Ignorance of the Laws does not constitute a valid excuse.  Illegal drugs and Controlled Substances: The use of illegal substances (including, but not limited to marijuana and its derivatives) and/or the illegal use of any controlled substances is strictly prohibited. Violation of this rule may result in the immediate and permanent discontinuation of any and all prescriptions being written by our practice. The use of any illegal substances is prohibited. Adopted CDC guidelines & recommendations: Target dosing levels will be at or below 60 MME/day. Use of benzodiazepines** is not recommended.  Exceptions: There are only two exceptions to the rule of not receiving pain medications from other Healthcare  Providers. Exception #1 (Emergencies): In the event of an emergency (i.e.: accident requiring emergency care), you are allowed to receive additional pain medication. However, you are responsible for: As soon as you are able, call our office [phone], at any time of the day or night, and leave a message stating your name, the date and nature of the emergency, and the name and dose of the medication prescribed. In the event that your call is answered by a member of our staff, make sure to document and save the date, time, and the name of the person that took your information.  Exception #2 (Planned Surgery): In the event that you are scheduled by another doctor or dentist to have any type of surgery or procedure, you are allowed (for a period no longer than 30 days), to receive additional pain medication, for the acute post-op pain. However, in this case, you are responsible for picking up a copy of our "Post-op Pain Management for Surgeons" handout, and giving it to your surgeon or dentist. This document is available at our office, and does not require an appointment to obtain it. Simply go to our office during business hours (Monday-Thursday from 8:00 AM to 4:00 PM) (Friday 8:00 AM to 12:00 Noon) or if you have a scheduled appointment with us, prior to your surgery, and ask for it by name. In addition, you are responsible for: calling our office [phone], at any time of the day or night, and leaving a message stating your name, name of your surgeon, type of surgery, and date of procedure or surgery. Failure to comply with your responsibilities may result in termination of therapy involving the controlled substances. Medication Agreement Violation. Following the above rules, including your responsibilities will help you in avoiding a Medication Agreement Violation ("Breaking your Pain Medication Contract").  Consequences:  Not following the above rules may result in permanent discontinuation of  medication prescription therapy.  *Opioid medications include: morphine, codeine, oxycodone, oxymorphone, hydrocodone, hydromorphone, meperidine, tramadol, tapentadol, buprenorphine, fentanyl, methadone. **Benzodiazepine medications include: diazepam (Valium), alprazolam (Xanax), clonazepam (Klonopine), lorazepam (Ativan), clorazepate (Tranxene), chlordiazepoxide (Librium), estazolam (Prosom), oxazepam (Serax), temazepam (Restoril), triazolam (Halcion) (Last updated: 11/02/2021) ______________________________________________________________________    ______________________________________________________________________  Medication Recommendations and Reminders  Applies to: All patients receiving prescriptions (written and/or electronic).  Medication Rules & Regulations: You are responsible for reading, knowing, and following our "Medication Rules" document. These exist for your safety and that of others. They are not flexible and neither are we. Dismissing or ignoring them is an   act of "non-compliance" that may result in complete and irreversible termination of such medication therapy. For safety reasons, "non-compliance" will not be tolerated. As with the [place] fundamental legal principle of "ignorance of the law is no defense", we will accept no excuses for not having read and knowing the content of documents provided to you by our practice.  Pharmacy of record:  Definition: This is the pharmacy where your electronic prescriptions will be sent.  We do not endorse any particular pharmacy. It is up to you and your insurance to decide what pharmacy to use.  We do not restrict you in your choice of pharmacy. However, once we write for your prescriptions, we will NOT be re-sending more prescriptions to fix restricted supply problems created by your pharmacy, or your insurance.  The pharmacy listed in the electronic medical record should be the one where you want electronic prescriptions to be  sent. If you choose to change pharmacy, simply notify our nursing staff. Changes will be made only during your regular appointments and not over the phone.  Recommendations: Keep all of your pain medications in a safe place, under lock and key, even if you live alone. We will NOT replace lost, stolen, or damaged medication. We do not accept "Police Reports" as proof of medications having been stolen. After you fill your prescription, take 1 week's worth of pills and put them away in a safe place. You should keep a separate, properly labeled bottle for this purpose. The remainder should be kept in the original bottle. Use this as your primary supply, until it runs out. Once it's gone, then you know that you have 1 week's worth of medicine, and it is time to come in for a prescription refill. If you do this correctly, it is unlikely that you will ever run out of medicine. To make sure that the above recommendation works, it is very important that you make sure your medication refill appointments are scheduled at least 1 week before you run out of medicine. To do this in an effective manner, make sure that you do not leave the office without scheduling your next medication management appointment. Always ask the nursing staff to show you in your prescription , when your medication will be running out. Then arrange for the receptionist to get you a return appointment, at least 7 days before you run out of medicine. Do not wait until you have 1 or 2 pills left, to come in. This is very poor planning and does not take into consideration that we may need to cancel appointments due to bad weather, sickness, or emergencies affecting our staff. DO NOT ACCEPT A "Partial Fill": If for any reason your pharmacy does not have enough pills/tablets to completely fill or refill your prescription, do not allow for a "partial fill". The law allows the pharmacy to complete that prescription within 72 hours, without requiring a new  prescription. If they do not fill the rest of your prescription within those 72 hours, you will need a separate prescription to fill the remaining amount, which we will NOT provide. If the reason for the partial fill is your insurance, you will need to talk to the pharmacist about payment alternatives for the remaining tablets, but again, DO NOT ACCEPT A PARTIAL FILL, unless you can trust your pharmacist to obtain the remainder of the pills within 72 hours.  Prescription refills and/or changes in medication(s):  Prescription refills, and/or changes in dose or medication, will be conducted only   during scheduled medication management appointments. (Applies to both, written and electronic prescriptions.) No refills on procedure days. No medication will be changed or started on procedure days. No changes, adjustments, and/or refills will be conducted on a procedure day. Doing so will interfere with the diagnostic portion of the procedure. No phone refills. No medications will be "called into the pharmacy". No Fax refills. No weekend refills. No Holliday refills. No after hours refills.  Remember:  Business hours are:  Monday to Thursday 8:00 AM to 4:00 PM Provider's Schedule: Merl Guardino, MD - Appointments are:  Medication management: Monday and Wednesday 8:00 AM to 4:00 PM Procedure day: Tuesday and Thursday 7:30 AM to 4:00 PM Bilal Lateef, MD - Appointments are:  Medication management: Tuesday and Thursday 8:00 AM to 4:00 PM Procedure day: Monday and Wednesday 7:30 AM to 4:00 PM (Last update: 11/02/2021) ______________________________________________________________________    ____________________________________________________________________________________________  WARNING: CBD (cannabidiol) & Delta (Delta-8 tetrahydrocannabinol) products.   Applicable to:  All individuals currently taking or considering taking CBD (cannabidiol) and, more important, all patients taking opioid  analgesic controlled substances (pain medication). (Example: oxycodone; oxymorphone; hydrocodone; hydromorphone; morphine; methadone; tramadol; tapentadol; fentanyl; buprenorphine; butorphanol; dextromethorphan; meperidine; codeine; etc.)  Introduction:  Recently there has been a drive towards the use of "natural" products for the treatment of different conditions, including pain anxiety and sleep disorders. Marijuana and hemp are two varieties of the cannabis genus plants. Marijuana and its derivatives are illegal, while hemp and its derivatives are not. Cannabidiol (CBD) and tetrahydrocannabinol (THC), are two natural compounds found in plants of the Cannabis genus. They can both be extracted from hemp or marijuana. Both compounds interact with your body's endocannabinoid system in very different ways. CBD is associated with pain relief (analgesia) while THC is associated with the psychoactive effects ("the high") obtained from the use of marijuana products. There are two main types of THC: Delta-9, which comes from the marijuana plant and it is illegal, and Delta-8, which comes from the hemp plant, and it is legal. (Both, Delta-9-THC and Delta-8-THC are psychoactive and give you "the high".)   Legality:  Marijuana and its derivatives: illegal Hemp and its derivatives: Legal (State dependent) UPDATE: (02/26/2021) The Drug Enforcement Agency (DEA) issued a letter stating that "delta" cannabinoids, including Delta-8-THCO and Delta-9-THCO, synthetically derived from hemp do not qualify as hemp and will be viewed as Schedule I drugs. (Schedule I drugs, substances, or chemicals are defined as drugs with no currently accepted medical use and a high potential for abuse. Some examples of Schedule I drugs are: heroin, lysergic acid diethylamide (LSD), marijuana (cannabis), 3,4-methylenedioxymethamphetamine (ecstasy), methaqualone, and peyote.) (https://www.dea.gov)  Legal status of CBD in [place]:  "Conditionally  Legal"  Reference: "FDA Regulation of Cannabis and Cannabis-Derived Products, Including Cannabidiol (CBD)" - https://www.fda.gov/news-events/public-health-focus/fda-regulation-cannabis-and-cannabis-derived-products-including-cannabidiol-cbd  Warning:  CBD is not FDA approved and has not undergo the same manufacturing controls as prescription drugs.  This means that the purity and safety of available CBD may be questionable. Most of the time, despite manufacturer's claims, it is contaminated with THC (delta-9-tetrahydrocannabinol - the chemical in marijuana responsible for the "HIGH").  When this is the case, the THC contaminant will trigger a positive urine drug screen (UDS) test for Marijuana (carboxy-THC).   The FDA recently put out a warning about 5 things that everyone should be aware of regarding Delta-8 THC: Delta-8 THC products have not been evaluated or approved by the FDA for safe use and may be marketed in ways that put the public health at   risk. The FDA has received adverse event reports involving delta-8 THC-containing products. Delta-8 THC has psychoactive and intoxicating effects. Delta-8 THC manufacturing often involve use of potentially harmful chemicals to create the concentrations of delta-8 THC claimed in the marketplace. The final delta-8 THC product may have potentially harmful by-products (contaminants) due to the chemicals used in the process. Manufacturing of delta-8 THC products may occur in uncontrolled or unsanitary settings, which may lead to the presence of unsafe contaminants or other potentially harmful substances. Delta-8 THC products should be kept out of the reach of children and pets.  NOTE: Because a positive UDS for any illicit substance is a violation of our medication agreement, your opioid analgesics (pain medicine) may be permanently discontinued.  MORE ABOUT CBD  General Information: CBD was discovered in 1940 and it is a derivative of the cannabis sativa  genus plants (Marijuana and Hemp). It is one of the 113 identified substances found in Marijuana. It accounts for up to 40% of the plant's extract. As of 2018, preliminary clinical studies on CBD included research for the treatment of anxiety, movement disorders, and pain. CBD is available and consumed in multiple forms, including inhalation of smoke or vapor, as an aerosol spray, and by mouth. It may be supplied as an oil containing CBD, capsules, dried cannabis, or as a liquid solution. CBD is thought not to be as psychoactive as THC (delta-9-tetrahydrocannabinol - the chemical in marijuana responsible for the "HIGH"). Studies suggest that CBD may interact with different biological target receptors in the body, including cannabinoid and other neurotransmitter receptors. As of 2018 the mechanism of action for its biological effects has not been determined.  Side-effects  Adverse reactions: Dry mouth, diarrhea, decreased appetite, fatigue, drowsiness, malaise, weakness, sleep disturbances, and others.  Drug interactions:  CBD may interact with medications such as blood-thinners. CBD causes drowsiness on its own and it will increase drowsiness caused by other medications, including antihistamines (such as Benadryl), benzodiazepines (Xanax, Ativan, Valium), antipsychotics, antidepressants, opioids, alcohol and supplements such as kava, melatonin and St. John's Wort.  Other drug interactions: Brivaracetam (Briviact); Caffeine; Carbamazepine (Tegretol); Citalopram (Celexa); Clobazam (Onfi); Eslicarbazepine (Aptiom); Everolimus (Zostress); Lithium; Methadone (Dolophine); Rufinamide (Banzel); Sedative medications (CNS depressants); Sirolimus (Rapamune); Stiripentol (Diacomit); Tacrolimus (Prograf); Tamoxifen ; Soltamox); Topiramate (Topamax); Valproate; Warfarin (Coumadin); Zonisamide. (Last update: 12/20/2021) ____________________________________________________________________________________________    ____________________________________________________________________________________________  Naloxone Nasal Spray  Why am I receiving this medication? [place] STOP ACT requires that all patients taking high dose opioids or at risk of opioids respiratory depression, be prescribed an opioid reversal agent, such as Naloxone (AKA: Narcan).  What is this medication? NALOXONE (nal OX one) treats opioid overdose, which causes slow or shallow breathing, severe drowsiness, or trouble staying awake. Call emergency services after using this medication. You may need additional treatment. Naloxone works by reversing the effects of opioids. It belongs to a group of medications called opioid blockers.  COMMON BRAND NAME(S): Kloxxado, Narcan  What should I tell my care team before I take this medication? They need to know if you have any of these conditions: Heart disease Substance use disorder An unusual or allergic reaction to naloxone, other medications, foods, dyes, or preservatives Pregnant or trying to get pregnant Breast-feeding  When to use this medication? This medication is to be used for the treatment of respiratory depression (less than 8 breaths per minute) secondary to opioid overdose.   How to use this medication? This medication is for use in the nose. Lay the person on their   back. Support their neck with your hand and allow the head to tilt back before giving the medication. The nasal spray should be given into 1 nostril. After giving the medication, move the person onto their side. Do not remove or test the nasal spray until ready to use. Get emergency medical help right away after giving the first dose of this medication, even if the person wakes up. You should be familiar with how to recognize the signs and symptoms of a narcotic overdose. If more doses are needed, give the additional dose in the other nostril. Talk to your care team about the use of this medication in children.  While this medication may be prescribed for children as young as newborns for selected conditions, precautions do apply.  Naloxone Overdosage: If you think you have taken too much of this medicine contact a poison control center or emergency room at once.  NOTE: This medicine is only for you. Do not share this medicine with others.  What if I miss a dose? This does not apply.  What may interact with this medication? This is only used during an emergency. No interactions are expected during emergency use. This list may not describe all possible interactions. Give your health care provider a list of all the medicines, herbs, non-prescription drugs, or dietary supplements you use. Also tell them if you smoke, drink alcohol, or use illegal drugs. Some items may interact with your medicine.  What should I watch for while using this medication? Keep this medication ready for use in the case of an opioid overdose. Make sure that you have the phone number of your care team and local hospital ready. You may need to have additional doses of this medication. Each nasal spray contains a single dose. Some emergencies may require additional doses. After use, bring the treated person to the nearest hospital or call 911. Make sure the treating care team knows that the person has received a dose of this medication. You will receive additional instructions on what to do during and after use of this medication before an emergency occurs.  What side effects may I notice from receiving this medication? Side effects that you should report to your care team as soon as possible: Allergic reactions--skin rash, itching, hives, swelling of the face, lips, tongue, or throat Side effects that usually do not require medical attention (report these to your care team if they continue or are bothersome): Constipation Dryness or irritation inside the nose Headache Increase in blood pressure Muscle spasms Stuffy  nose Toothache This list may not describe all possible side effects. Call your doctor for medical advice about side effects. You may report side effects to FDA at 1-800-FDA-1088.  Where should I keep my medication? Because this is an emergency medication, you should keep it with you at all times.  Keep out of the reach of children and pets. Store between 20 and 25 degrees C (68 and 77 degrees F). Do not freeze. Throw away any unused medication after the expiration date. Keep in original box until ready to use.  NOTE: This sheet is a summary. It may not cover all possible information. If you have questions about this medicine, talk to your doctor, pharmacist, or health care provider.   2023 Elsevier/Gold Standard (2020-09-04 00:00:00)  ____________________________________________________________________________________________   

## 2021-12-22 NOTE — Progress Notes (Signed)
Nursing Pain Medication Assessment:  Safety precautions to be maintained throughout the outpatient stay will include: orient to surroundings, keep bed in low position, maintain call bell within reach at all times, provide assistance with transfer out of bed and ambulation.  Medication Inspection Compliance: Pill count conducted under aseptic conditions, in front of the patient. Neither the pills nor the bottle was removed from the patient's sight at any time. Once count was completed pills were immediately returned to the patient in their original bottle.  Medication: Oxycodone IR Pill/Patch Count: 93/120 Pill/Patch Appearance: Markings consistent with prescribed medication Bottle Appearance: Standard pharmacy container. Clearly labeled. Filled Date: 9 / 6 / 2023 Last Medication intake:  Today

## 2021-12-22 NOTE — Progress Notes (Signed)
PROVIDER NOTE: Information contained herein reflects review and annotations entered in association with encounter. Interpretation of such information and data should be left to medically-trained personnel. Information provided to patient can be located elsewhere in the medical record under "Patient Instructions". Document created using STT-dictation technology, any transcriptional errors that may result from process are unintentional.    Patient: Laura [name]  Service Category: E/M  Provider: Gaspar Cola, MD  DOB: Jul 11, 1945  DOS: 12/22/2021  Referring Provider: Adin Hector, MD  MRN: [id number]  Specialty: Interventional Pain Management  PCP: Adin Hector, MD  Type: Established Patient  Setting: Ambulatory outpatient    Location: Office  Delivery: Face-to-face     HPI  Ms. Saraia Platner, a 76 y.o. year old female, is here today because of her Chronic pain syndrome [G89.4]. Ms. Dimascio primary complain today is Back Pain Last encounter: My last encounter with her was on 11/23/2021. Pertinent problems: Ms. 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); Lumbar foraminal stenosis (Bilateral L3-4 and L5-S1); Chronic hip pain (Left); Osteoarthritis of knee (Right); Arthritis; Fibromyalgia syndrome; Chronic arthralgias of knees and hips (Right); Chronic hand pain (Left); Dropfoot (Left); Weakness of foot (Left); Sciatica (Left); Weakness of leg (Left); Chronic lower extremity pain (2ry area of Pain) (Bilateral); Lumbosacral radiculopathy at L5  (Left); Abnormal MRI, lumbar spine (04/30/2021); DDD (degenerative disc disease), lumbosacral; Statin myopathy; Foot drop (Left); 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 Pain Assessment: Severity of Chronic pain is reported as a 7 /10. Location: Back Lower/buttocks bilateral down back of legs to above knees. Onset: More than a month ago. Quality: Aching, Burning, Constant, Discomfort. Timing: Constant. Modifying factor(s): medications, rest. Vitals:  height is _0  (1.651 m) and weight is 245 lb (111.1 kg). Her temperature is 77.2 F (36.2 C) (abnormal). Her blood pressure is 123/67 and her pulse is 85. Her respiration is 16 and oxygen saturation is 98%.  BMI: Estimated body mass index is 40.77 kg/m as calculated from the following:   Height as of this encounter: _1  (1.651 m).   Weight as of this encounter: 245 lb (111.1 kg).  Reason for encounter: medication management.  The patient indicates doing well with the current medication regimen. No adverse reactions or side effects reported to the medications.   BMP reveals the third of 3 prescriptions written on 09/27/2021 to have been filled on 12/15/2021.  The patient continues to exhibit symptoms associated with her severe central spinal stenosis at the L3-4 level.  In multiple locations we have talked to the patient about going to a neurosurgeon for possible decompression, but the patient continues to prefer avoiding surgery despite the fact that the symptoms associated with her stenosis (neurogenic claudication) have not improved significantly with interventional therapies and continues to significantly  impact her activities of daily living.  RTCB: 04/14/2022    Pharmacotherapy  Assessment  Analgesic: Oxycodone IR 5 mg every 6 hours (20 mg/day) MME/day: 30 mg/day.   Monitoring: [place] PMP: PDMP reviewed during this encounter.       Pharmacotherapy: No side-effects or adverse reactions reported. Compliance: No problems identified. Effectiveness: Clinically acceptable.  Ignatius Specking, RN  12/22/2021  3:06 PM  Sign when Signing Visit Nursing Pain Medication Assessment:  Safety precautions to be maintained throughout the outpatient stay will include: orient to surroundings, keep bed in low position, maintain call bell within reach at all times, provide assistance with transfer out of bed and ambulation.  Medication Inspection Compliance: Pill count conducted under aseptic conditions, in front of the patient. Neither the pills nor the bottle was removed from the patient's sight at any time. Once count was completed pills were immediately returned to the patient in their original bottle.  Medication: Oxycodone IR Pill/Patch Count: 93/120 Pill/Patch Appearance: Markings consistent with prescribed medication Bottle Appearance: Standard pharmacy container. Clearly labeled. Filled Date: 77 / 6 / 2023 Last Medication intake:  Today      No results found for: "CBDTHCR" No results found for: "D8THCCBX" No results found for: "D9THCCBX"  UDS:  Summary  Date Value Ref Range Status  06/22/2021 Note  Final    Comment:    ==================================================================== [facility] 13 (MW) ==================================================================== Test                             Result       Flag       Units  Drug Present and Declared for Prescription Verification   Oxycodone                      1634         EXPECTED   ng/mg creat   Oxymorphone                    770          EXPECTED   ng/mg creat   Noroxycodone                   2933         EXPECTED    ng/mg creat   Noroxymorphone                 191          EXPECTED   ng/mg creat    Sources of oxycodone are scheduled prescription medications.    Oxymorphone, noroxycodone, and noroxymorphone are expected    metabolites of oxycodone. Oxymorphone is also available as a    scheduled prescription medication.  ==================================================================== Test                      Result    Flag   Units      Ref Range   Creatinine              94               mg/dL      >=20 ==================================================================== Declared Medications:  The flagging and interpretation on this report are based on the  following declared medications.  Unexpected results may arise from  inaccuracies in the declared medications.   **Note: The testing scope of this panel includes these medications:   Oxycodone (OxyIR)   **Note: The testing scope of this panel does not include the  following  reported medications:   Albuterol (Ventolin HFA)  Aspirin  Azelastine (Astelin)  Duloxetine (Cymbalta)  Folic Acid  Furosemide (Lasix)  Hydroxychloroquine (Plaquenil)  Hydroxyzine (Vistaril)  Infliximab (Remicade)  Levothyroxine (Synthroid)  Loratadine (Claritin)  Methotrexate  Metoprolol (Toprol)  Multivitamin  Ondansetron (Zofran)  Rosuvastatin (Crestor)  Spironolactone (Aldactone)  Trazodone (Desyrel)  Vitamin D ==================================================================== For clinical consultation, please call [phone]. ====================================================================       ROS  Constitutional: Denies any fever or chills Gastrointestinal: No reported hemesis, hematochezia, vomiting, or acute GI distress Musculoskeletal: Denies any acute onset joint swelling, redness, loss of ROM, or weakness Neurological: No reported episodes of acute onset apraxia, aphasia, dysarthria, agnosia, amnesia, paralysis, loss of  coordination, or loss of consciousness  Medication Review  DULoxetine, Multi-Vitamins, Vitamin D, albuterol, aspirin EC, azelastine, benzonatate, doxepin, folic acid, furosemide, hydrOXYzine, hydroxychloroquine, inFLIXimab, levothyroxine, loratadine, methotrexate, metoprolol succinate, naloxone, ondansetron, oxyCODONE, and rosuvastatin  History Review  Allergy: Ms. Wrench is allergic to bupropion, jardiance [empagliflozin], and penicillins. Drug: Ms. Bonneau  reports no history of drug use. Alcohol:  reports no history of alcohol use. Tobacco:  reports that she has never smoked. She has never used smokeless tobacco. Social: Ms. Scholes  reports that she has never smoked. She has never used smokeless tobacco. She reports that she does not drink alcohol and does not use drugs. Medical:  has a past medical history of Allergy, Anxiety, Arthritis, degenerative (10/08/2013), Asthma, Chronic hand pain, left (10/10/2019), Chronic kidney disease, Depression, Lumbar spinal stenosis with neurogenic claudication (11/07/2014), Major depression, single episode, in complete remission ([place]) (06/25/2015), Memory loss, short term (03/19/2014), Rheumatoid arthritis ([place]), Sciatica of left side (12/31/2019), Seizure ([place]) (10/07/2014), Sleep apnea, Sleep apnea, and Thyroid disease. Surgical: Ms. Ortloff  has a past surgical history that includes Abdominal hysterectomy; Cesarean section; Replacement total knee (Left); and Hip surgery (Right). Family: family history includes Alcohol abuse in her father; Depression in her father and sister; Heart attack in her father; Hypertension in her mother and sister; Post-traumatic stress disorder in her father; Rheum arthritis in her sister; Stroke in her mother.  Laboratory Chemistry Profile   Renal Lab Results  Component Value Date   BUN 34 (H) 02/15/2021   CREATININE 1.37 (H) 02/15/2021   GFRAA 57 (L) 10/03/2019   GFRNONAA 40 (L) 02/15/2021    Hepatic Lab Results   Component Value Date   AST 20 01/14/2021   ALT 11 01/14/2021   ALBUMIN 4.0 01/14/2021   ALKPHOS 55 01/14/2021   LIPASE 22 01/14/2021    Electrolytes Lab Results  Component Value Date   NA 132 (L) 02/15/2021   K 4.2 02/15/2021   CL 95 (L) 02/15/2021   CALCIUM 9.4 02/15/2021   MG 2.3 10/08/2018    Bone Lab Results  Component Value Date   25OHVITD1 36 07/15/2015   25OHVITD2 3.6 07/15/2015   25OHVITD3 32 07/15/2015    Inflammation (CRP: Acute Phase) (ESR: Chronic Phase) Lab Results  Component Value Date   CRP 1.9 (H) 07/15/2015   ESRSEDRATE 32 (H) 07/15/2015   LATICACIDVEN 1.4 10/07/2018         Note: Above Lab results reviewed.  Recent Imaging Review  DG PAIN CLINIC C-ARM 1-60 MIN NO REPORT Fluoro was used, but no Radiologist interpretation will be provided.  Please refer to "NOTES" tab for provider progress note. Note: Reviewed        Physical Exam  General appearance: Well nourished, well developed, and well hydrated. In no apparent acute distress Mental status: Alert,  oriented x 3 (person, place, & time)       Respiratory: No evidence of acute respiratory distress Eyes: PERLA Vitals: BP 123/67 Comment: on BP meds  Pulse 85   Temp (!) 97.2 F (36.2 C)   Resp 16   Ht _0  (1.651 m)   Wt 245 lb (111.1 kg)   SpO2 98%   BMI 40.77 kg/m  BMI: Estimated body mass index is 40.77 kg/m as calculated from the following:   Height as of this encounter: _1  (1.651 m).   Weight as of this encounter: 245 lb (111.1 kg). Ideal: Ideal body weight: 57 kg (125 lb 10.6 oz) Adjusted ideal body weight: 78.7 kg (173 lb 6.4 oz)  Assessment   Diagnosis Status  1. Chronic pain syndrome   2. Chronic low back pain (1ry area of Pain) (Bilateral) (L>R) w/ sciatica (Bilateral)   3. Chronic lower extremity pain (2ry area of Pain) (Bilateral)   4. 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 Lumbar lateral recess stenosis (Bilateral) (L2-3,  L3-4, L4-5) (Severe: L3-4)   6. 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 Lumbar Grade 1 Anterolisthesis of L3/L4 and L4/L5   8. Ligamentum flavum hypertrophy (L1-2, L2-3, L3-4)   9. DDD (degenerative disc disease), lumbosacral   10. Lumbar facet syndrome (Bilateral) (L>R)   11. Chronic hip pain (Left)   12. Chronic hand pain (Left)   13. Chronic knee pain (Right)   14. Seronegative rheumatoid arthritis ([place])   15. Generalized osteoarthritis of multiple sites   16. Fibromyalgia syndrome   17. Abnormal MRI, lumbar spine (04/30/2021)   18. Pharmacologic therapy   19. Chronic use of opiate for therapeutic purpose   20. Encounter for medication management   21. Encounter for chronic pain management    Controlled Controlled Controlled   Updated Problems: Problem  Abnormal MRI, lumbar spine (04/30/2021)   (04/30/2021) LUMBAR MRI FINDINGS: Alignment:  Degenerative grade 1 anterolisthesis at L3-4 and L4-5 Conus medullaris and cauda equina: Conus extends to the T12-L1 level. Conus appears normal. There is cauda equina redundancy due to the degree of severe spinal stenosis at L3-4. Paraspinal and other soft tissues: Fatty atrophy of intrinsic back muscles. DISC LEVELS: L1-2: Mild disc narrowing and bulging. Mild facet spurring. Mild spinal stenosis L2-3: Disc narrowing and bulging with facet spurring and ligamentum flavum thickening. Moderate spinal stenosis that is similar or mildly progressed from before. Noncompressive bilateral foraminal narrowing based on sagittal images L3-4: Facet osteoarthritis with spurring and ligamentous thickening. Mild anterolisthesis. Circumferential disc bulging with severe spinal stenosis effacing CSF. Noncompressive bilateral foraminal narrowing L4-5: Disc narrowing and bulging. Advanced facet osteoarthritis with spurring and anterolisthesis. Moderate spinal stenosis. Bilateral L5 impingement at the subarticular recesses. Patent foramina L5-S1:  Disc narrowing and bulging with endplate and facet spurring. Patent canal and foramina.  IMPRESSION: 1. Advanced lumbar spine degeneration with L3-4 and L4-5 anterolisthesis. 2. L3-4 chronic, severe spinal stenosis. 3. L2-3 and L4-5 moderate to advanced spinal stenosis. Bilateral L5 impingement at the subarticular recesses of L4-5.  ___________________________________________________________________  (12/26/2019) LUMBAR MRI FINDINGS: Alignment: Unchanged grade 1 anterolisthesis of L3 on L4 and L4 on L5, facet mediated. Vertebrae: No acute fracture or suspicious osseous lesion. New L2 superior endplate Schmorl's node without edema. Unchanged mild central endplate compression deformities in the lower lumbar spine. Conus medullaris and cauda equina: Conus extends to the upper L1 level and appears normal. Chronic redundancy of  the cauda equina secondary to high-grade spinal stenosis. Paraspinal and other soft tissues: Small bilateral renal cysts.  Disc levels: Disc desiccation throughout the lumbar spine. Diffuse congenital narrowing of the lumbar spinal canal due to short pedicles. T12-L1: Mild facet arthrosis without disc herniation or stenosis. L1-2: Mild disc bulging and moderate facet and ligamentum flavum hypertrophy result in new mild spinal stenosis without neural foraminal stenosis. L2-3: Disc bulging and moderate facet and ligamentum flavum hypertrophy result in moderate spinal stenosis, mild right and mild-to-moderate left lateral recess stenosis, and mild right and moderate left neural foraminal stenosis, slightly progressed. L3-4: Anterolisthesis with bulging uncovered disc and severe facet and ligamentum flavum hypertrophy result in severe spinal stenosis, severe bilateral lateral recess stenosis, and severe right and moderate left neural foraminal stenosis, not significantly changed. L4-5: Anterolisthesis with bulging uncovered disc and severe facet hypertrophy result in moderate  spinal stenosis, moderate to severe bilateral lateral recess stenosis, and moderate bilateral neural foraminal stenosis, not significantly changed. L5-S1: Mild disc bulging eccentric to the right and mild to moderate facet arthrosis result in mild right neural foraminal stenosis without spinal stenosis, unchanged.  IMPRESSION: 1. Slight progression of moderate spinal stenosis at L2-3. 2. New mild spinal stenosis at L1-2. 3. Unchanged severe spinal stenosis and severe right and moderate left neural foraminal stenosis at L3-4. 4. Unchanged moderate spinal stenosis and moderate neural foraminal stenosis at L4-5.     Plan of Care  Problem-specific:  No problem-specific Assessment & Plan notes found for this encounter.  Ms. Darenda Fike has a current medication list which includes the following long-term medication(s): albuterol, azelastine, doxepin, duloxetine, infliximab, levothyroxine, loratadine, metoprolol succinate, [START ON 01/14/2022] oxycodone, [START ON 02/13/2022] oxycodone, and [START ON 03/15/2022] oxycodone.  Pharmacotherapy (Medications Ordered): Meds ordered this encounter  Medications   oxyCODONE (OXY IR/ROXICODONE) 5 MG immediate release tablet    Sig: Take 1 tablet (5 mg total) by mouth every 6 (six) hours as needed for severe pain. Must last 30 days    Dispense:  120 tablet    Refill:  0    DO NOT: delete (not duplicate); no partial-fill (will deny script to complete), no refill request (F/U required). DISPENSE: 1 day early if closed on fill date. WARN: No CNS-depressants within 8 hrs of med.   oxyCODONE (OXY IR/ROXICODONE) 5 MG immediate release tablet    Sig: Take 1 tablet (5 mg total) by mouth every 6 (six) hours as needed for severe pain. Must last 30 days    Dispense:  120 tablet    Refill:  0    DO NOT: delete (not duplicate); no partial-fill (will deny script to complete), no refill request (F/U required). DISPENSE: 1 day early if closed on fill date. WARN: No  CNS-depressants within 8 hrs of med.   oxyCODONE (OXY IR/ROXICODONE) 5 MG immediate release tablet    Sig: Take 1 tablet (5 mg total) by mouth every 6 (six) hours as needed for severe pain. Must last 30 days    Dispense:  120 tablet    Refill:  0    DO NOT: delete (not duplicate); no partial-fill (will deny script to complete), no refill request (F/U required). DISPENSE: 1 day early if closed on fill date. WARN: No CNS-depressants within 8 hrs of med.   naloxone (NARCAN) nasal spray 4 mg/0.1 mL    Sig: Place 1 spray into the nose as needed for up to 365 doses (for opioid-induced respiratory depresssion). In case of emergency (overdose), spray once  into each nostril. If no response within 3 minutes, repeat application and call 161.    Dispense:  1 each    Refill:  0    Instruct patient in proper use of device.   Orders:  No orders of the defined types were placed in this encounter.  Follow-up plan:   Return in about 4 months (around 04/14/2022) for Eval-day (M,W), (F2F), (MM).     Interventional Therapies  Risk  Complexity Considerations:   Estimated body mass index is 38.27 kg/m as calculated from the following:   Height as of 12/22/20: _0  (1.651 m).   Weight as of 12/22/20: 230 lb (104.3 kg). PLAQUENIL Anticoagulation (Stop: 11 days)   Planned  Pending:   Therapeutic midline L2-3 LESI #4    Under consideration:   Diagnostic bilateral genicular NB  Diagnostic bilateral L3 and/or L5 TFESI   Completed:   Therapeutic right IA steroid knee injection x2 (10/15/2015)  Therapeutic right Hyalgan knee injection x1 (09/01/2016)  Therapeutic left lumbar facet block x1 (01/22/2015)  Therapeutic left IA hip injection x1 (03/17/2015)  Therapeutic left L4-5 LESI x1 (02/04/2020) (100/100/85/85)  Therapeutic midline L2-3 LESI x3 (12/22/2020) (95/95/95/95)    Therapeutic  Palliative (PRN) options:   Therapeutic right IA Hyalgan knee injections Palliative left IA hip joint injection   Diagnostic left lumbar facet MBB #2         Recent Visits Date Type Provider Dept  11/04/21 Office Visit Milinda Pointer, MD [facility]  10/21/21 Procedure visit Milinda Pointer, [facility]  09/27/21 Office Visit Milinda Pointer, MD [facility]  Showing recent visits within past 90 days and meeting all other requirements Today's Visits Date Type Provider Dept  12/22/21 Office Visit Milinda Pointer, MD [facility]  Showing today's visits and meeting all other requirements Future Appointments No visits were found meeting these conditions. Showing future appointments within next 90 days and meeting all other requirements  I discussed the assessment and treatment plan with the patient. The patient was provided an opportunity to ask questions and all were answered. The patient agreed with the plan and demonstrated an understanding of the instructions.  Patient advised to call back or seek an in-person evaluation if the symptoms or condition worsens.  Duration of encounter: 30 minutes.  Total time on encounter, as per AMA guidelines included both the face-to-face and non-face-to-face time personally spent by the physician and/or other qualified health care professional(s) on the day of the encounter (includes time in activities that require the physician or other qualified health care professional and does not include time in activities normally performed by clinical staff). Physician's time may include the following activities when performed: preparing to see the patient (eg, review of tests, pre-charting review of records) obtaining and/or reviewing separately obtained history performing a medically appropriate examination and/or evaluation counseling and educating the patient/family/caregiver ordering medications, tests, or procedures referring and communicating with other health care professionals (when not separately  reported) documenting clinical information in the electronic or other health record independently interpreting results (not separately reported) and communicating results to the patient/ family/caregiver care coordination (not separately reported)  Note by: Gaspar Cola, MD Date: 12/22/2021; Time: 3:25 PM

## 2022-01-21 ENCOUNTER — Telehealth: Payer: Self-pay | Admitting: Orthopedic Surgery

## 2022-01-21 NOTE — Telephone Encounter (Signed)
I have reviewed below message and agree. If she continues with pain and swelling in the ankle then she should be seen by her PCP or UC.

## 2022-01-21 NOTE — Telephone Encounter (Signed)
-----  Message from [name] sent at 01/21/2022  3:44 PM EST ----- Regarding: [facility] reporting fall Contact: [phone] Laura [name] from [place] calling to report a fall that happened on 01/22/2022. Transferring from her recliner to her wheelchair. She injured her left ankle. The paramedics came out to help her up. So Laura [name] did not do her PT today. Laura [name] advised her if ankle swelling not better by next week she needs to see her PCP.

## 2022-01-23 ENCOUNTER — Other Ambulatory Visit: Payer: Self-pay | Admitting: Psychiatry

## 2022-01-23 DIAGNOSIS — F3342 Major depressive disorder, recurrent, in full remission: Secondary | ICD-10-CM

## 2022-01-24 ENCOUNTER — Telehealth: Payer: Self-pay

## 2022-01-24 NOTE — Telephone Encounter (Signed)
called patient daughter and explained that her mother has 3 90-day supply at the [facility] that they needed to call for refills.

## 2022-01-24 NOTE — Telephone Encounter (Signed)
pt daughter left message that her mother needed refill on her medication and to send to [facility]

## 2022-01-24 NOTE — Telephone Encounter (Signed)
called [facility] because patient should have enough until july   according to the pharmacist pt has 3 more 90 day supply refills.

## 2022-01-25 NOTE — Telephone Encounter (Signed)
pt daughter was told that all the medications had enough refills to last until july of this year.

## 2022-01-28 ENCOUNTER — Telehealth: Payer: Self-pay

## 2022-01-28 NOTE — Telephone Encounter (Signed)
Per daughter's previous message, she has history of lymphedema in both legs. Recommend that she follow up with her PCP regarding increased swelling and also regarding her blood pressure.   If she is having any severe headaches, shortness of breath, chest pain, or dizziness then I recommend that she go to the ED.

## 2022-01-28 NOTE — Telephone Encounter (Signed)
LM on Laura [name]'s voicemail that we recommend she follow up with her PCP regarding these issues and we recommend ED if she is having any symptoms mentioned in Laura [name]'s message. Will also send mychart message to Laura [name] and her daughter.

## 2022-01-28 NOTE — Telephone Encounter (Signed)
-----  Message from [name] sent at 01/28/2022  1:18 PM EST ----- Regarding: [facility] reporting Contact: [phone] Elta Guadeloupe from [place] Her ankles are 32 cm outside of her normal parameter, her normal is 27-30 cm 172/100 BP Pain is 7/10 low back and leg pain

## 2022-01-31 ENCOUNTER — Emergency Department: Payer: Medicare Other

## 2022-01-31 ENCOUNTER — Encounter: Payer: Self-pay | Admitting: Emergency Medicine

## 2022-01-31 ENCOUNTER — Inpatient Hospital Stay
Admission: EM | Admit: 2022-01-31 | Discharge: 2022-02-02 | DRG: 552 | Disposition: A | Discharge status: Other Acute Inpatient Hospital | Payer: Medicare Other | Attending: Internal Medicine | Admitting: Internal Medicine

## 2022-01-31 DIAGNOSIS — I5022 Chronic systolic (congestive) heart failure: Secondary | ICD-10-CM | POA: Diagnosis present

## 2022-01-31 DIAGNOSIS — M0600 Rheumatoid arthritis without rheumatoid factor, unspecified site: Secondary | ICD-10-CM | POA: Diagnosis present

## 2022-01-31 DIAGNOSIS — Z818 Family history of other mental and behavioral disorders: Secondary | ICD-10-CM

## 2022-01-31 DIAGNOSIS — M48061 Spinal stenosis, lumbar region without neurogenic claudication: Secondary | ICD-10-CM | POA: Diagnosis present

## 2022-01-31 DIAGNOSIS — S3210XA Unspecified fracture of sacrum, initial encounter for closed fracture: Secondary | ICD-10-CM | POA: Diagnosis present

## 2022-01-31 DIAGNOSIS — Z888 Allergy status to other drugs, medicaments and biological substances status: Secondary | ICD-10-CM

## 2022-01-31 DIAGNOSIS — G8929 Other chronic pain: Secondary | ICD-10-CM

## 2022-01-31 DIAGNOSIS — D84821 Immunodeficiency due to drugs: Secondary | ICD-10-CM | POA: Diagnosis present

## 2022-01-31 DIAGNOSIS — W19XXXA Unspecified fall, initial encounter: Secondary | ICD-10-CM

## 2022-01-31 DIAGNOSIS — M545 Low back pain, unspecified: Secondary | ICD-10-CM | POA: Diagnosis present

## 2022-01-31 DIAGNOSIS — M8448XA Pathological fracture, other site, initial encounter for fracture: Secondary | ICD-10-CM

## 2022-01-31 DIAGNOSIS — Z7982 Long term (current) use of aspirin: Secondary | ICD-10-CM

## 2022-01-31 DIAGNOSIS — Z8249 Family history of ischemic heart disease and other diseases of the circulatory system: Secondary | ICD-10-CM

## 2022-01-31 DIAGNOSIS — Z79891 Long term (current) use of opiate analgesic: Secondary | ICD-10-CM

## 2022-01-31 DIAGNOSIS — I700 Atherosclerosis of aorta: Secondary | ICD-10-CM | POA: Diagnosis present

## 2022-01-31 DIAGNOSIS — Z79890 Hormone replacement therapy (postmenopausal): Secondary | ICD-10-CM

## 2022-01-31 DIAGNOSIS — S32009A Unspecified fracture of unspecified lumbar vertebra, initial encounter for closed fracture: Principal | ICD-10-CM

## 2022-01-31 DIAGNOSIS — Y92009 Unspecified place in unspecified non-institutional (private) residence as the place of occurrence of the external cause: Secondary | ICD-10-CM

## 2022-01-31 DIAGNOSIS — Z96652 Presence of left artificial knee joint: Secondary | ICD-10-CM | POA: Diagnosis present

## 2022-01-31 DIAGNOSIS — I130 Hypertensive heart and chronic kidney disease with heart failure and stage 1 through stage 4 chronic kidney disease, or unspecified chronic kidney disease: Secondary | ICD-10-CM | POA: Diagnosis present

## 2022-01-31 DIAGNOSIS — M8580 Other specified disorders of bone density and structure, unspecified site: Secondary | ICD-10-CM | POA: Diagnosis present

## 2022-01-31 DIAGNOSIS — E039 Hypothyroidism, unspecified: Secondary | ICD-10-CM | POA: Diagnosis present

## 2022-01-31 DIAGNOSIS — Z1152 Encounter for screening for COVID-19: Secondary | ICD-10-CM

## 2022-01-31 DIAGNOSIS — M48062 Spinal stenosis, lumbar region with neurogenic claudication: Secondary | ICD-10-CM | POA: Diagnosis present

## 2022-01-31 DIAGNOSIS — Z79899 Other long term (current) drug therapy: Secondary | ICD-10-CM

## 2022-01-31 DIAGNOSIS — Z7960 Long term (current) use of unspecified immunomodulators and immunosuppressants: Secondary | ICD-10-CM

## 2022-01-31 DIAGNOSIS — Z811 Family history of alcohol abuse and dependence: Secondary | ICD-10-CM

## 2022-01-31 DIAGNOSIS — I5020 Unspecified systolic (congestive) heart failure: Secondary | ICD-10-CM | POA: Diagnosis present

## 2022-01-31 DIAGNOSIS — N189 Chronic kidney disease, unspecified: Secondary | ICD-10-CM | POA: Diagnosis present

## 2022-01-31 DIAGNOSIS — W1830XA Fall on same level, unspecified, initial encounter: Secondary | ICD-10-CM | POA: Diagnosis present

## 2022-01-31 DIAGNOSIS — E119 Type 2 diabetes mellitus without complications: Secondary | ICD-10-CM

## 2022-01-31 DIAGNOSIS — E1122 Type 2 diabetes mellitus with diabetic chronic kidney disease: Secondary | ICD-10-CM | POA: Diagnosis present

## 2022-01-31 DIAGNOSIS — I480 Paroxysmal atrial fibrillation: Secondary | ICD-10-CM | POA: Diagnosis present

## 2022-01-31 DIAGNOSIS — Z823 Family history of stroke: Secondary | ICD-10-CM

## 2022-01-31 DIAGNOSIS — Z8261 Family history of arthritis: Secondary | ICD-10-CM

## 2022-01-31 DIAGNOSIS — M549 Dorsalgia, unspecified: Secondary | ICD-10-CM | POA: Diagnosis present

## 2022-01-31 DIAGNOSIS — R262 Difficulty in walking, not elsewhere classified: Secondary | ICD-10-CM

## 2022-01-31 DIAGNOSIS — F411 Generalized anxiety disorder: Secondary | ICD-10-CM | POA: Diagnosis present

## 2022-01-31 DIAGNOSIS — Z880 Allergy status to penicillin: Secondary | ICD-10-CM

## 2022-01-31 DIAGNOSIS — I2510 Atherosclerotic heart disease of native coronary artery without angina pectoris: Secondary | ICD-10-CM | POA: Diagnosis present

## 2022-01-31 LAB — COMPREHENSIVE METABOLIC PANEL
ALT: 10 U/L (ref 0–44)
AST: 18 U/L (ref 15–41)
Albumin: 3.5 g/dL (ref 3.5–5.0)
Alkaline Phosphatase: 74 U/L (ref 38–126)
Anion gap: 14 (ref 5–15)
BUN: 18 mg/dL (ref 8–23)
CO2: 30 mmol/L (ref 22–32)
Calcium: 8.6 mg/dL — ABNORMAL LOW (ref 8.9–10.3)
Chloride: 90 mmol/L — ABNORMAL LOW (ref 98–111)
Creatinine, Ser: 0.97 mg/dL (ref 0.44–1.00)
GFR, Estimated: >60 mL/min (ref >60)
Glucose, Bld: 154 mg/dL — ABNORMAL HIGH (ref 70–99)
Potassium: 3.3 mmol/L — ABNORMAL LOW (ref 3.5–5.1)
Sodium: 134 mmol/L — ABNORMAL LOW (ref 135–145)
Total Bilirubin: 0.5 mg/dL (ref 0.3–1.2)
Total Protein: 7.4 g/dL (ref 6.5–8.1)

## 2022-01-31 LAB — CBC WITH DIFFERENTIAL/PLATELET
Abs Immature Granulocytes: 0.04 10*3/uL (ref 0.00–0.07)
Basophils Absolute: 0.1 10*3/uL (ref 0.0–0.1)
Basophils Relative: 1 %
Eosinophils Absolute: 0.3 10*3/uL (ref 0.0–0.5)
Eosinophils Relative: 2 %
HCT: 38.8 % (ref 36.0–46.0)
Hemoglobin: 12.5 g/dL (ref 12.0–15.0)
Immature Granulocytes: 0 %
Lymphocytes Relative: 20 %
Lymphs Abs: 2.1 10*3/uL (ref 0.7–4.0)
MCH: 28.6 pg (ref 26.0–34.0)
MCHC: 32.2 g/dL (ref 30.0–36.0)
MCV: 88.8 fL (ref 80.0–100.0)
Monocytes Absolute: 0.8 10*3/uL (ref 0.1–1.0)
Monocytes Relative: 8 %
Neutro Abs: 7.5 10*3/uL (ref 1.7–7.7)
Neutrophils Relative %: 69 %
Platelets: 401 10*3/uL — ABNORMAL HIGH (ref 150–400)
RBC: 4.37 MIL/uL (ref 3.87–5.11)
RDW: 13.5 % (ref 11.5–15.5)
WBC: 10.9 10*3/uL — ABNORMAL HIGH (ref 4.0–10.5)
nRBC: 0 % (ref 0.0–0.2)

## 2022-01-31 LAB — RESP PANEL BY RT-PCR (RSV, FLU A&B, COVID)  RVPGX2
Influenza A by PCR: NEGATIVE
Influenza B by PCR: NEGATIVE
Resp Syncytial Virus by PCR: NEGATIVE
SARS Coronavirus 2 by RT PCR: NEGATIVE

## 2022-01-31 LAB — LACTIC ACID, PLASMA: Lactic Acid, Venous: 1.4 mmol/L (ref 0.5–1.9)

## 2022-01-31 LAB — TROPONIN I (HIGH SENSITIVITY)
Troponin I (High Sensitivity): 23 ng/L — ABNORMAL HIGH (ref <18)
Troponin I (High Sensitivity): 25 ng/L — ABNORMAL HIGH (ref <18)

## 2022-01-31 MED ORDER — IOHEXOL 300 MG/ML  SOLN
100.0000 mL | Freq: Once | INTRAMUSCULAR | Status: AC | PRN
  Administered 2022-01-31: 100 mL via INTRAVENOUS

## 2022-01-31 MED ORDER — ACETAMINOPHEN 500 MG PO TABS
1000.0000 mg | ORAL_TABLET | Freq: Once | ORAL | Status: AC
  Administered 2022-01-31: 1000 mg via ORAL
  Filled 2022-01-31: qty 2

## 2022-01-31 NOTE — ED Triage Notes (Addendum)
Pt presents via POV with complaints of sacral and mid back pain following a fall on 1/11. Pt states she was sitting on her rollater and slid off on to her buttock. Pt states as of today she's had some intermittent dizziness. Denies LOC, hitting her head,  CP or SOB.

## 2022-01-31 NOTE — ED Notes (Signed)
This RN received a call from XR stating that the patient was unable to tolerate the scans so they will be incomplete. Pt is unable to lay flat for proper positioning.

## 2022-01-31 NOTE — ED Triage Notes (Signed)
EMS brings pt in from home; Fall on 1/11, witnessed by family but refused transport; f/u with PCP for ankle pain with neg findings; c/o persistent lower back pain; increased her oxy from 5mg  to 10mg  on approval by PCP

## 2022-01-31 NOTE — ED Provider Notes (Signed)
[facility] Provider Note    Event Date/Time   First MD Initiated Contact with Patient 01/31/22 2142     (approximate)   History   Fall   HPI  Laura [name] is a 77 y.o. female extensive past medical history including CKD, seizure, long form opioid use, lumbar stenosis presents to the ER for worsening low back pain that occurred after a fall on the 11th.  Since then she is progressed to the point where she is no longer able to ambulate or move around at home.  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     Physical Exam   Triage Vital Signs: ED Triage Vitals  Enc Vitals Group     BP 01/31/22 1945 138/73     Pulse Rate 01/31/22 1945 81     Resp 01/31/22 1945 18     Temp 01/31/22 1945 (!) 101.1 F (38.4 C)     Temp Source 01/31/22 1945 Oral     SpO2 01/31/22 1945 94 %     Weight 01/31/22 1947 240 lb (108.9 kg)     Height 01/31/22 1947 5\' 5"  (1.651 m)     Head Circumference --      Peak Flow --      Pain Score 01/31/22 2145 5     Pain Loc --      Pain Edu? --      Excl. in [place]? --     Most recent vital signs: Vitals:   01/31/22 1945 01/31/22 2200  BP: 138/73 (!) 115/54  Pulse: 81 86  Resp: 18 18  Temp: (!) 101.1 F (38.4 C) 98.4 F (36.9 C)  SpO2: 94% 100%     Constitutional: Alert  Eyes: Conjunctivae are normal.  Head: Atraumatic. Nose: No congestion/rhinnorhea. Mouth/Throat: Mucous membranes are moist.   Neck: Painless ROM.  Cardiovascular:   Good peripheral circulation. Respiratory: Normal respiratory effort.  No retractions.  Gastrointestinal: Soft and nontender.  Musculoskeletal: Bilateral 2+ pitting edema. Neurologic:   No gross focal neurologic deficits are appreciated.  Skin:  Skin is warm, dry and intact.Marland Kitchen Psychiatric: Mood and affect are normal. Speech and behavior are normal.    ED Results / Procedures / Treatments   Labs (all labs ordered are  listed, but only abnormal results are displayed) Labs Reviewed  CBC WITH DIFFERENTIAL/PLATELET - Abnormal; Notable for the following components:      Result Value   WBC 10.9 (*)    Platelets 401 (*)    All other components within normal limits  COMPREHENSIVE METABOLIC PANEL - Abnormal; Notable for the following components:   Sodium 134 (*)    Potassium 3.3 (*)    Chloride 90 (*)    Glucose, Bld 154 (*)    Calcium 8.6 (*)    All other components within normal limits  TROPONIN I (HIGH SENSITIVITY) - Abnormal; Notable for the following components:   Troponin I (High Sensitivity) 23 (*)    All other components within normal limits  RESP PANEL BY RT-PCR (RSV, FLU A&B, COVID)  [id number]  LACTIC ACID, PLASMA  URINALYSIS, ROUTINE W REFLEX MICROSCOPIC  TROPONIN I (HIGH SENSITIVITY)     EKG  ED ECG REPORT I, Merlyn Lot, the attending physician, personally viewed and interpreted this ECG.   Date: 01/31/2022  EKG Time: 19:52  Rate: 80  Rhythm: sinus  Axis: normal  Intervals: normal  ST&T Change: no stemi, no depressions  RADIOLOGY Please see ED Course for my review and interpretation.  I personally reviewed all radiographic images ordered to evaluate for the above acute complaints and reviewed radiology reports and findings.  These findings were personally discussed with the patient.  Please see medical record for radiology report.    PROCEDURES:  Critical Care performed: No  Procedures   MEDICATIONS ORDERED IN ED: Medications  iohexol (OMNIPAQUE) 300 MG/ML solution 100 mL (has no administration in time range)  acetaminophen (TYLENOL) tablet 1,000 mg (1,000 mg Oral Given 01/31/22 1953)     IMPRESSION / MDM / ASSESSMENT AND PLAN / ED COURSE  I reviewed the triage vital signs and the nursing notes.                              Differential diagnosis includes, but is not limited to, fracture, contusion, spinal stenosis, deconditioning, DVT, UTI, stone, discitis,  viral illness  Patient presenting to the ER for evaluation of symptoms as described above.  Based on symptoms, risk factors and considered above differential, this presenting complaint could reflect a potentially life-threatening illness therefore the patient will be placed on continuous pulse oximetry and telemetry for monitoring.  Laboratory evaluation will be sent to evaluate for the above complaints.      Clinical Course as of 01/31/22 2303  Mon Jan 31, 2022  2251 Chest x-ray my review and interpretation without evidence of pneumothorax.  Lactate normal.  Patient currently hemodynamically stable.  Will be signed out to oncoming physician pending follow-up imaging. [PR]    Clinical Course User Index [PR] Merlyn Lot, MD    FINAL CLINICAL IMPRESSION(S) / ED DIAGNOSES   Final diagnoses:  Midline low back pain, unspecified chronicity, unspecified whether sciatica present     Rx / DC Orders   ED Discharge Orders     None        Note:  This document was prepared using Dragon voice recognition software and may include unintentional dictation errors.    Merlyn Lot, MD 01/31/22 2303

## 2022-02-01 ENCOUNTER — Emergency Department: Payer: Medicare Other

## 2022-02-01 ENCOUNTER — Encounter (HOSPITAL_COMMUNITY): Payer: Self-pay

## 2022-02-01 ENCOUNTER — Other Ambulatory Visit: Payer: Self-pay

## 2022-02-01 ENCOUNTER — Telehealth: Payer: Self-pay | Admitting: Psychiatry

## 2022-02-01 DIAGNOSIS — M48061 Spinal stenosis, lumbar region without neurogenic claudication: Secondary | ICD-10-CM | POA: Diagnosis present

## 2022-02-01 DIAGNOSIS — M8448XA Pathological fracture, other site, initial encounter for fracture: Secondary | ICD-10-CM

## 2022-02-01 DIAGNOSIS — F411 Generalized anxiety disorder: Secondary | ICD-10-CM | POA: Diagnosis present

## 2022-02-01 DIAGNOSIS — Z818 Family history of other mental and behavioral disorders: Secondary | ICD-10-CM | POA: Diagnosis not present

## 2022-02-01 DIAGNOSIS — Z811 Family history of alcohol abuse and dependence: Secondary | ICD-10-CM | POA: Diagnosis not present

## 2022-02-01 DIAGNOSIS — E1122 Type 2 diabetes mellitus with diabetic chronic kidney disease: Secondary | ICD-10-CM | POA: Diagnosis present

## 2022-02-01 DIAGNOSIS — M25561 Pain in right knee: Secondary | ICD-10-CM | POA: Diagnosis not present

## 2022-02-01 DIAGNOSIS — E039 Hypothyroidism, unspecified: Secondary | ICD-10-CM | POA: Diagnosis present

## 2022-02-01 DIAGNOSIS — Z1152 Encounter for screening for COVID-19: Secondary | ICD-10-CM | POA: Diagnosis not present

## 2022-02-01 DIAGNOSIS — M549 Dorsalgia, unspecified: Secondary | ICD-10-CM | POA: Diagnosis not present

## 2022-02-01 DIAGNOSIS — I129 Hypertensive chronic kidney disease with stage 1 through stage 4 chronic kidney disease, or unspecified chronic kidney disease: Secondary | ICD-10-CM | POA: Diagnosis not present

## 2022-02-01 DIAGNOSIS — S32009A Unspecified fracture of unspecified lumbar vertebra, initial encounter for closed fracture: Secondary | ICD-10-CM | POA: Diagnosis present

## 2022-02-01 DIAGNOSIS — R262 Difficulty in walking, not elsewhere classified: Secondary | ICD-10-CM

## 2022-02-01 DIAGNOSIS — S72001A Fracture of unspecified part of neck of right femur, initial encounter for closed fracture: Secondary | ICD-10-CM | POA: Diagnosis not present

## 2022-02-01 DIAGNOSIS — N189 Chronic kidney disease, unspecified: Secondary | ICD-10-CM | POA: Diagnosis present

## 2022-02-01 DIAGNOSIS — W1830XA Fall on same level, unspecified, initial encounter: Secondary | ICD-10-CM | POA: Diagnosis present

## 2022-02-01 DIAGNOSIS — M48062 Spinal stenosis, lumbar region with neurogenic claudication: Secondary | ICD-10-CM | POA: Diagnosis present

## 2022-02-01 DIAGNOSIS — M545 Low back pain, unspecified: Secondary | ICD-10-CM | POA: Diagnosis present

## 2022-02-01 DIAGNOSIS — Z8249 Family history of ischemic heart disease and other diseases of the circulatory system: Secondary | ICD-10-CM | POA: Diagnosis not present

## 2022-02-01 DIAGNOSIS — G894 Chronic pain syndrome: Secondary | ICD-10-CM | POA: Diagnosis not present

## 2022-02-01 DIAGNOSIS — W19XXXA Unspecified fall, initial encounter: Secondary | ICD-10-CM

## 2022-02-01 DIAGNOSIS — Z8261 Family history of arthritis: Secondary | ICD-10-CM | POA: Diagnosis not present

## 2022-02-01 DIAGNOSIS — M84353A Stress fracture, unspecified femur, initial encounter for fracture: Secondary | ICD-10-CM | POA: Diagnosis not present

## 2022-02-01 DIAGNOSIS — S329XXA Fracture of unspecified parts of lumbosacral spine and pelvis, initial encounter for closed fracture: Secondary | ICD-10-CM | POA: Diagnosis not present

## 2022-02-01 DIAGNOSIS — S3210XA Unspecified fracture of sacrum, initial encounter for closed fracture: Secondary | ICD-10-CM | POA: Diagnosis present

## 2022-02-01 DIAGNOSIS — I480 Paroxysmal atrial fibrillation: Secondary | ICD-10-CM | POA: Diagnosis present

## 2022-02-01 DIAGNOSIS — D84821 Immunodeficiency due to drugs: Secondary | ICD-10-CM | POA: Diagnosis present

## 2022-02-01 DIAGNOSIS — I2510 Atherosclerotic heart disease of native coronary artery without angina pectoris: Secondary | ICD-10-CM | POA: Diagnosis present

## 2022-02-01 DIAGNOSIS — Z79891 Long term (current) use of opiate analgesic: Secondary | ICD-10-CM | POA: Diagnosis not present

## 2022-02-01 DIAGNOSIS — I130 Hypertensive heart and chronic kidney disease with heart failure and stage 1 through stage 4 chronic kidney disease, or unspecified chronic kidney disease: Secondary | ICD-10-CM | POA: Diagnosis present

## 2022-02-01 DIAGNOSIS — F3342 Major depressive disorder, recurrent, in full remission: Secondary | ICD-10-CM

## 2022-02-01 DIAGNOSIS — M0600 Rheumatoid arthritis without rheumatoid factor, unspecified site: Secondary | ICD-10-CM | POA: Diagnosis present

## 2022-02-01 DIAGNOSIS — M8580 Other specified disorders of bone density and structure, unspecified site: Secondary | ICD-10-CM | POA: Diagnosis present

## 2022-02-01 DIAGNOSIS — Y92009 Unspecified place in unspecified non-institutional (private) residence as the place of occurrence of the external cause: Secondary | ICD-10-CM | POA: Diagnosis not present

## 2022-02-01 DIAGNOSIS — Z823 Family history of stroke: Secondary | ICD-10-CM | POA: Diagnosis not present

## 2022-02-01 DIAGNOSIS — G8929 Other chronic pain: Secondary | ICD-10-CM | POA: Diagnosis present

## 2022-02-01 DIAGNOSIS — I700 Atherosclerosis of aorta: Secondary | ICD-10-CM | POA: Diagnosis present

## 2022-02-01 DIAGNOSIS — I5022 Chronic systolic (congestive) heart failure: Secondary | ICD-10-CM | POA: Diagnosis present

## 2022-02-01 HISTORY — DX: Unspecified fall, initial encounter: W19.XXXA

## 2022-02-01 HISTORY — DX: Unspecified fracture of unspecified lumbar vertebra, initial encounter for closed fracture: S32.009A

## 2022-02-01 MED ORDER — ACETAMINOPHEN 325 MG PO TABS: 650.0000 mg | ORAL_TABLET | Freq: Four times a day (QID) | ORAL | Status: DC | PRN

## 2022-02-01 MED ORDER — LEVOTHYROXINE SODIUM 50 MCG PO TABS: 200.0000 ug | ORAL_TABLET | Freq: Every day | ORAL | Status: DC

## 2022-02-01 MED ORDER — ONDANSETRON HCL 4 MG/2ML IJ SOLN: 4.0000 mg | Freq: Four times a day (QID) | INTRAMUSCULAR | Status: DC | PRN

## 2022-02-01 MED ORDER — ALBUTEROL SULFATE HFA 108 (90 BASE) MCG/ACT IN AERS: 2.0000 | INHALATION_SPRAY | Freq: Four times a day (QID) | RESPIRATORY_TRACT | Status: DC | PRN

## 2022-02-01 MED ORDER — DULOXETINE HCL 60 MG PO CPEP
60.0000 mg | ORAL_CAPSULE | Freq: Every day | ORAL | Status: DC
  Administered 2022-02-01: 60 mg via ORAL
  Filled 2022-02-01: qty 1

## 2022-02-01 MED ORDER — KETOROLAC TROMETHAMINE 15 MG/ML IJ SOLN
15.0000 mg | Freq: Four times a day (QID) | INTRAMUSCULAR | Status: DC | PRN
  Administered 2022-02-01 (×2): 15 mg via INTRAVENOUS
  Filled 2022-02-01 (×2): qty 1

## 2022-02-01 MED ORDER — ACETAMINOPHEN 650 MG RE SUPP: 650.0000 mg | Freq: Four times a day (QID) | RECTAL | Status: DC | PRN

## 2022-02-01 MED ORDER — METOPROLOL SUCCINATE ER 50 MG PO TB24
100.0000 mg | ORAL_TABLET | Freq: Every day | ORAL | Status: DC
  Administered 2022-02-01: 100 mg via ORAL
  Filled 2022-02-01: qty 2

## 2022-02-01 MED ORDER — DULOXETINE HCL 60 MG PO CPEP: 60.0000 mg | ORAL_CAPSULE | Freq: Every day | ORAL | 1 refills | Status: DC

## 2022-02-01 MED ORDER — ENOXAPARIN SODIUM 60 MG/0.6ML IJ SOSY
0.5000 mg/kg | PREFILLED_SYRINGE | INTRAMUSCULAR | Status: DC
  Administered 2022-02-01: 55 mg via SUBCUTANEOUS
  Filled 2022-02-01: qty 0.6

## 2022-02-01 MED ORDER — ONDANSETRON HCL 4 MG PO TABS: 4.0000 mg | ORAL_TABLET | Freq: Four times a day (QID) | ORAL | Status: DC | PRN

## 2022-02-01 MED ORDER — MORPHINE SULFATE (PF) 2 MG/ML IV SOLN
2.0000 mg | INTRAVENOUS | Status: DC | PRN
  Administered 2022-02-01 – 2022-02-02 (×2): 2 mg via INTRAVENOUS
  Filled 2022-02-01 (×3): qty 1

## 2022-02-01 MED ORDER — POTASSIUM CHLORIDE CRYS ER 20 MEQ PO TBCR
40.0000 meq | EXTENDED_RELEASE_TABLET | Freq: Once | ORAL | Status: AC
  Administered 2022-02-01: 40 meq via ORAL
  Filled 2022-02-01: qty 2

## 2022-02-01 MED ORDER — OXYCODONE-ACETAMINOPHEN 5-325 MG PO TABS
1.0000 | ORAL_TABLET | Freq: Once | ORAL | Status: AC
  Administered 2022-02-01: 1 via ORAL
  Filled 2022-02-01: qty 1

## 2022-02-01 MED ORDER — HYDROXYZINE PAMOATE 50 MG PO CAPS: 75.0000 mg | ORAL_CAPSULE | Freq: Every evening | ORAL | Status: DC | PRN

## 2022-02-01 MED ORDER — ASPIRIN 81 MG PO TBEC
81.0000 mg | DELAYED_RELEASE_TABLET | Freq: Every day | ORAL | Status: DC
  Administered 2022-02-01: 81 mg via ORAL
  Filled 2022-02-01: qty 1

## 2022-02-01 MED ORDER — LEVOTHYROXINE SODIUM 50 MCG PO TABS
250.0000 ug | ORAL_TABLET | Freq: Every day | ORAL | Status: DC
  Administered 2022-02-01: 250 ug via ORAL
  Filled 2022-02-01: qty 1

## 2022-02-01 MED ORDER — INFLUENZA VAC A&B SA ADJ QUAD 0.5 ML IM PRSY: 0.5000 mL | PREFILLED_SYRINGE | INTRAMUSCULAR | Status: DC

## 2022-02-01 MED ORDER — OXYCODONE HCL 5 MG PO TABS
5.0000 mg | ORAL_TABLET | Freq: Four times a day (QID) | ORAL | Status: DC | PRN
  Administered 2022-02-01 – 2022-02-02 (×3): 5 mg via ORAL
  Filled 2022-02-01 (×4): qty 1

## 2022-02-01 MED ORDER — HYDROXYCHLOROQUINE SULFATE 200 MG PO TABS
100.0000 mg | ORAL_TABLET | Freq: Every day | ORAL | Status: DC
  Administered 2022-02-01: 100 mg via ORAL
  Filled 2022-02-01: qty 0.5

## 2022-02-01 MED ORDER — ROSUVASTATIN CALCIUM 5 MG PO TABS: 5.0000 mg | ORAL_TABLET | ORAL | Status: DC

## 2022-02-01 MED ORDER — ALBUTEROL SULFATE (2.5 MG/3ML) 0.083% IN NEBU: 2.5000 mg | INHALATION_SOLUTION | Freq: Four times a day (QID) | RESPIRATORY_TRACT | Status: DC | PRN

## 2022-02-01 NOTE — Assessment & Plan Note (Signed)
Continue levothyroxine 

## 2022-02-01 NOTE — Progress Notes (Signed)
PHARMACIST - PHYSICIAN COMMUNICATION  CONCERNING:  Enoxaparin (Lovenox) for DVT Prophylaxis    RECOMMENDATION: Patient was prescribed enoxaprin 40mg  q24 hours for VTE prophylaxis.   Filed Weights   01/31/22 1947  Weight: 108.9 kg (240 lb)    Body mass index is 39.94 kg/m.  Estimated Creatinine Clearance: 59.7 mL/min (by C-G formula based on SCr of 0.97 mg/dL).   Based on [place] patient is candidate for enoxaparin 0.5mg /kg TBW SQ every 24 hours based on BMI being >30.  DESCRIPTION: Pharmacy has adjusted enoxaparin dose per [employer] policy.  Patient is now receiving enoxaparin 0.5 mg/kg every 24 hours   Renda Rolls, PharmD, [facility] 02/01/2022 3:20 AM

## 2022-02-01 NOTE — ED Notes (Signed)
Patient incontinent of large amount of urine in brief. Linens changed, pericare performed and dry brief applied. External female catheter applied.

## 2022-02-01 NOTE — Discharge Summary (Signed)
Physician Discharge Summary   Patient: Laura [name] MRN: [id number] DOB: 1945/11/12  Admit date:     01/31/2022  Discharge date: 02/01/22  Discharge Physician: Max Sane   PCP: Adin Hector, MD   Recommendations at discharge:    Transfer to [facility] for Ortho-Trauma Dr Haddix evaluation for Pelvic stabilisation  Discharge Diagnoses: Principal Problem:   Midline low back pain Active Problems:   Intractable back pain   Sacral insufficiency fracture   Fracture of transverse process of lumbar vertebra, closed, initial encounter [facility])   Ambulatory dysfunction   Long term prescription opiate use   Chronic back pain   Hypothyroidism   Congestive heart failure with left ventricular systolic dysfunction (HCC)   Type 2 diabetes mellitus (HCC)   Seronegative rheumatoid arthritis (HCC)   GAD (generalized anxiety disorder)   Coronary artery disease involving native coronary artery of native heart without angina pectoris   Paroxysmal atrial fibrillation ([place])   Fall at home, initial encounter   Spinal stenosis at L4-L5 level  Hospital Course:  77 y.o. female with k/h/o HTN, hypothyroidism, RA on immunosupressants, Paroxysmal a.fib not on anticoagulation, chronic back pain on opiates, anxiety, depression,  left L5 transverse process fracture s/p fall with known lumbar stenosis.  She has neurogenic claudication from that. She has an acute sacral fracture consistent with a sacral insufficiency fracture, Neurosurgery Dr Izora Ribas reviewed the films with Dr Haddix/orthopedic trauma attendings at [facility] who felt that she may benefit from stabilization given that she is having significant difficulty with ambulation due to pain.  Pelvic stabilization is not offered at this facility, so transfer will be necessary for higher level of care where orthopedic trauma specialists are available  Assessment and Plan: Sacral insufficiency fracture L5 transverse process fracture secondary to mechanical  fall Intractable back pain with ambulatory dysfunction Chronic back pain on chronic opiates Chronic physical deconditioning Pain control Eval by Dr Doreatha Martin at [facility] for Pelvic stabilization surgery  Paroxysmal atrial fibrillation (HCC) Continue metoprolol and aspirin Not on systemic anticoagulation as paroxysmal A-fib was in the setting of acute infection  Coronary artery disease involving native coronary artery of native heart without angina pectoris Continue aspirin, rosuvastatin and metoprolol  GAD (generalized anxiety disorder) Continue home duloxetine, hydroxyzine and doxepin  Seronegative rheumatoid arthritis ([place]) On methotrexate and Remicade and Plaquenil  Type 2 diabetes mellitus (HCC) Sliding scale insulin coverage  Congestive heart failure with left ventricular systolic dysfunction (HCC) Continue Lasix and metoprolol  Hypothyroidism Continue levothyroxine         Consultants: Neurosurgery Disposition:  [facility] Diet recommendation:  Discharge Diet Orders (From admission, onward)     Start     Ordered   02/01/22 0000  Diet - low sodium heart healthy        02/01/22 2020           Carb modified diet DISCHARGE MEDICATION: Allergies as of 02/01/2022       Reactions   Bupropion Other (See Comments)   Jardiance [empagliflozin] Other (See Comments)   Penicillins Rash   Other Reaction(s): Not available        Medication List     TAKE these medications    albuterol 108 (90 Base) MCG/ACT inhaler Commonly known as: VENTOLIN HFA Inhale 2 puffs into the lungs every 6 (six) hours as needed for wheezing or shortness of breath.   aspirin EC 81 MG tablet Take by mouth.   azelastine 0.1 % nasal spray Commonly known as: ASTELIN Place 1 spray  into both nostrils daily as needed for rhinitis.   doxepin 10 MG capsule Commonly known as: SINEQUAN Take 10 mg by mouth.   DULoxetine 60 MG capsule Commonly known as: CYMBALTA TAKE 1 CAPSULE BY MOUTH EVERY  DAY What changed: Another medication with the same name was added. Make sure you understand how and when to take each.   DULoxetine 60 MG capsule Commonly known as: Cymbalta Take 1 capsule (60 mg total) by mouth daily. What changed: You were already taking a medication with the same name, and this prescription was added. Make sure you understand how and when to take each.   folic acid 1 MG tablet Commonly known as: FOLVITE Take 1 mg by mouth daily.   furosemide 80 MG tablet Commonly known as: LASIX Take 80 mg by mouth every Monday, Wednesday, and Friday. Takes MWF   hydroxychloroquine 200 MG tablet Commonly known as: PLAQUENIL Take 100 mg by mouth daily.   inFLIXimab 100 MG injection Commonly known as: REMICADE Inject 100 mg into the vein every 8 (eight) weeks.   levothyroxine 125 MCG tablet Commonly known as: SYNTHROID Take 250 mcg by mouth daily before breakfast.   loratadine 10 MG tablet Commonly known as: CLARITIN Take 10 mg by mouth daily.   methotrexate 2.5 MG tablet Commonly known as: RHEUMATREX TAKE 4 TABLETS (10 MG TOTAL) BY MOUTH EVERY 7 (SEVEN) DAYS WITH A MEAL   metoprolol succinate 100 MG 24 hr tablet Commonly known as: TOPROL-XL Take 1 tablet (100 mg total) by mouth daily. Take with or immediately following a meal.   Multi-Vitamins Tabs Take 1 tablet by mouth daily.   naloxone 4 MG/0.1ML Liqd nasal spray kit Commonly known as: NARCAN Place 1 spray into the nose as needed for up to 365 doses (for opioid-induced respiratory depresssion). In case of emergency (overdose), spray once into each nostril. If no response within 3 minutes, repeat application and call 911.   oxyCODONE 5 MG immediate release tablet Commonly known as: Oxy IR/ROXICODONE Take 1 tablet (5 mg total) by mouth every 6 (six) hours as needed for severe pain. Must last 30 days   oxyCODONE 5 MG immediate release tablet Commonly known as: Oxy IR/ROXICODONE Take 1 tablet (5 mg total) by  mouth every 6 (six) hours as needed for severe pain. Must last 30 days Start taking on: February 13, 2022   oxyCODONE 5 MG immediate release tablet Commonly known as: Oxy IR/ROXICODONE Take 1 tablet (5 mg total) by mouth every 6 (six) hours as needed for severe pain. Must last 30 days Start taking on: March 15, 2022   potassium chloride 10 MEQ tablet Commonly known as: KLOR-CON Take 10 mEq by mouth every Monday, Wednesday, and Friday.   rosuvastatin 5 MG tablet Commonly known as: CRESTOR Take 5 mg by mouth 2 (two) times a week.        Discharge Exam: Filed Weights   01/31/22 1947  Weight: 108.9 kg   Constitutional: General: She is not in acute distress. HENT: Head: Normocephalic and atraumatic.  Cardiovascular: Regular rate and rhythm Pulmonary: CTA b/l Abdominal: Soft, benign Neurological: Awake, Alert, Non-focal Back: tenderness in lower back  Condition at discharge: fair  The results of significant diagnostics from this hospitalization (including imaging, microbiology, ancillary and laboratory) are listed below for reference.   Imaging Studies: US Venous Img Lower Bilateral  Result Date: 02/01/2022 CLINICAL DATA:  Bilateral leg swelling EXAM: BILATERAL LOWER EXTREMITY VENOUS DOPPLER ULTRASOUND TECHNIQUE: Gray-scale sonography with compression, as well  as color and duplex ultrasound, were performed to evaluate the deep venous system(s) from the level of the common femoral vein through the popliteal and proximal calf veins. COMPARISON:  None Available. FINDINGS: VENOUS Normal compressibility of the common femoral, superficial femoral, and popliteal veins. The calf veins are not well visualized bilaterally due to the patient's body habitus. Visualized portions of profunda femoral vein and great saphenous vein unremarkable. No filling defects to suggest DVT on grayscale or color Doppler imaging. Doppler waveforms show normal direction of venous flow, normal respiratory plasticity  and response to augmentation. OTHER None. Limitations: none IMPRESSION: 1. No evidence of femoropopliteal DVT bilaterally Electronically Signed   By: Helyn Numbers M.D.   On: 02/01/2022 01:48   CT ABDOMEN PELVIS W CONTRAST  Addendum Date: 02/01/2022   ADDENDUM REPORT: 02/01/2022 00:53 ADDENDUM: In retrospect, there are nondisplaced insufficiency fractures of the anterior right and left aspects of the sacral ala at S2, with fracture continuing across the ventral S2 body where there is inward cortical buckling. There is also a nondisplaced fracture of the left L5 transverse process. The sacral fracture likely accounts for the minimal presacral fluid and stranding noted, although the patient may still have a mild proctitis. Electronically Signed   By: Almira Bar M.D.   On: 02/01/2022 00:53   Result Date: 02/01/2022 CLINICAL DATA:  Fall injury January 11th with continued or worsening back pain. Loss of ambulatory status since then. History of chronic neurogenic claudication from lumbar stenosis. EXAM: CT ABDOMEN AND PELVIS WITH CONTRAST TECHNIQUE: Multidetector CT imaging of the abdomen and pelvis was performed using the standard protocol following bolus administration of intravenous contrast. RADIATION DOSE REDUCTION: This exam was performed according to the departmental dose-optimization program which includes automated exposure control, adjustment of the mA and/or kV according to patient size and/or use of iterative reconstruction technique. CONTRAST:  OMNIPAQUE IOHEXOL 300 MG/ML  SOLN COMPARISON:  CT with IV contrast 01/14/2021. FINDINGS: Lower chest: Chronic scarring changes again noted in the base of the right middle lobe and lingula. Lung bases are otherwise clear. There is mild cardiomegaly. Hepatobiliary: No focal liver abnormality is seen. No calcified gallstones, gallbladder wall thickening, or biliary dilatation. Pancreas: Diffuse fatty atrophy. No focal abnormality. No abnormality. No  splenomegaly. Spleen: No abnormality. Adrenals/Urinary Tract: There is no adrenal mass. There is a 1.2 cm hyperdense cyst in the posterior aspect of the left kidney which today measures 78 Hounsfield units both in the initial postcontrast phase and in the delayed phase, previously measuring 7.7 Hounsfield units. There are scattered bilateral renal cortical scar-like defects, bilateral small renal lengths 8 cm on the right and 8.2 cm on the left with cortical thinning. No adrenal mass is seen. There is no urinary stone or obstruction. Bilateral week excretion of contrast is noted in the delayed phase. Stomach/Bowel: The stomach and unopacified small bowel are unremarkable. The appendix is normal caliber, retrocecal. There is moderate retained stool ascending and transverse colon. Left-sided diverticula. No findings of acute diverticulitis or colitis. Possible however that the patient could have Proctitis or rectal infiltrating disease, with posterior thickening in the distal rectal wall noted and trace stranding which could be inflammatory or congestive, minimal presacral ascites also seen in the area. Vascular/Lymphatic: Aortic atherosclerosis. No enlarged abdominal or pelvic lymph nodes. Reproductive: Status post hysterectomy. No adnexal masses. Other: As above there is minimal presacral ascites. There is a small umbilical fat hernia with no incarcerated hernia. There is no free hemorrhage, abscess or free  air. Musculoskeletal: No acute lumbar compression fracture is evident. Osteopenia and degenerative change of the lumbar spine are again noted. There is acquired spinal stenosis at L3-4 and L4-5, severe and unchanged. Variable foraminal stenosis multiple lumbar levels greatest at L3-4, L4-5 and L5-S1. There is bilateral hip DJD with an old right hip nailing. Chronic fracture deformities of the right pelvic rami. IMPRESSION: 1. No acute trauma related findings in the abdomen or pelvis. 2. Constipation and  diverticulosis. 3. Posterior rectal wall thickening with trace stranding and minimal presacral ascites. Findings could be due to proctitis or rectal infiltrating disease. Further evaluation recommended. 4. 1.2 cm hyperdense cyst in the posterior left kidney, was previously much lower in density. 5. Aortic atherosclerosis. 6. Cardiomegaly. 7. Osteopenia and degenerative change with acquired spinal stenosis at L3-4 and L4-5, and multilevel foraminal stenosis. 8. Umbilical fat hernia. Aortic Atherosclerosis (ICD10-I70.0). Electronically Signed: By: Almira Bar M.D. On: 02/01/2022 00:24   CT L-SPINE NO CHARGE  Result Date: 02/01/2022 CLINICAL DATA:  Back pain, frequent falls, and recent loss of ambulation. EXAM: CT LUMBAR SPINE WITHOUT CONTRAST TECHNIQUE: Multidetector CT imaging of the lumbar spine was performed without intravenous contrast administration. Multiplanar CT image reconstructions were also generated. RADIATION DOSE REDUCTION: This exam was performed according to the departmental dose-optimization program which includes automated exposure control, adjustment of the mA and/or kV according to patient size and/or use of iterative reconstruction technique. COMPARISON:  MRI lumbar spine from 12/16/2019 and 04/29/2021 FINDINGS: Segmentation: 5 lumbar type vertebrae. Alignment: Grade 1 degenerative anterolisthesis again noted at L3-4 and L4-5, unchanged. No other alignment abnormality. Vertebrae: Osteopenia. There is a nondisplaced transverse fracture of the left L5 transverse process. No other lumbar fracture or destructive bone lesion is seen. There is lumbar spondylosis. A prominent upper plate Schmorl's node is again shown at L2, additional Schmorl's node formation opposite L3-4, L4-5, and L5-S1. Paraspinal and other soft tissues: Partial fatty atrophy in the intrinsic back musculature similar to prior study. There is aortic atherosclerosis without AAA. There is no paraspinal hematoma. Disc levels:  T10-11: There are anterior osteophytes with attempted bridging. Mild disc space loss. No herniation or stenosis. T11-12: No herniation or stenosis. T12-L1: No herniation or stenosis. L1-2: There is a small calcified right paracentral disc extrusion impressing on the right ventral thecal sac. Similar mild disc space loss. Mild facet joint spurring and mild foraminal stenosis are again shown. There is mild spinal stenosis due to ligamentous and facet hypertrophy and the disc extrusion. Mild degenerative foraminal stenosis. L2-3: There is preservation of the normal disc height. Dorsal ligamentous and facet hypertrophy are again noted and a posterior disc bulge, causing moderate acquired spinal canal stenosis. There is only mild foraminal stenosis. L3-4: There is mild disc space loss. Circumferential disc osteophyte complex. This and advanced facet hypertrophy with ligamentous thickening combine to cause severe acquired spinal stenosis, unchanged. There is moderate right and mild to moderate left foraminal stenosis. L4-5: This disc remains normal in height. There are small endplate spurs. Due to a dorsal annular bulge and advanced facet hypertrophy with ligamentous thickening, there is severe acquired spinal canal stenosis and effacement of the thecal sac continuing into the subarticular zones. There is moderate right-greater-than-left foraminal stenosis. L5-S1: There is mild disc space loss. There is a mild nonstenosing posterior disc bulge. Moderate facet hypertrophy is seen with severe right, mild-to-moderate left foraminal stenosis. Other: Patent SI joints with mild spurring and vacuum phenomenon. Noted are nondisplaced acute insufficiency fractures of the anterior right and  left aspects of the sacral ala at S2, extending ventrally across the S2 body with inward cortical buckling anteriorly. IMPRESSION: 1. Acute nondisplaced fracture of the left L5 transverse process. No lumbar compression fracture is seen. 2. Acute  nondisplaced insufficiency fractures of the anterior right and left aspects of the sacral ala at S2, extending ventrally across the S2 body with inward cortical buckling. 3. Osteopenia and degenerative changes. 4. Severe acquired spinal canal stenosis at L3-4 and L4-5, moderate at L2-3. 5. Multilevel foraminal stenosis greatest on the right at L5-S1. 6. Aortic atherosclerosis. Aortic Atherosclerosis (ICD10-I70.0). Electronically Signed   By: Telford Nab M.D.   On: 02/01/2022 00:50   DG Chest Portable 1 View  Result Date: 01/31/2022 CLINICAL DATA:  Fall and weakness EXAM: PORTABLE CHEST 1 VIEW COMPARISON:  Chest x-ray 11/11/2019 FINDINGS: Heart is mildly enlarged. There central pulmonary vascular congestion. There is no focal lung infiltrate, pleural effusion or pneumothorax. No acute fractures are seen. IMPRESSION: No active disease. Electronically Signed   By: Ronney Asters M.D.   On: 01/31/2022 23:09    Microbiology: Results for orders placed or performed during the hospital encounter of 01/31/22  Resp panel by RT-PCR (RSV, Flu A&B, Covid) Anterior Nasal Swab     Status: None   Collection Time: 01/31/22 10:09 PM   Specimen: Anterior Nasal Swab  Result Value Ref Range Status   SARS Coronavirus 2 by RT PCR NEGATIVE NEGATIVE Final    Comment: (NOTE) SARS-CoV-2 target nucleic acids are NOT DETECTED.  The SARS-CoV-2 RNA is generally detectable in upper respiratory specimens during the acute phase of infection. The lowest concentration of SARS-CoV-2 viral copies this assay can detect is 138 copies/mL. A negative result does not preclude SARS-Cov-2 infection and should not be used as the sole basis for treatment or other patient management decisions. A negative result may occur with  improper specimen collection/handling, submission of specimen other than nasopharyngeal swab, presence of viral mutation(s) within the areas targeted by this assay, and inadequate number of viral copies(<138  copies/mL). A negative result must be combined with clinical observations, patient history, and epidemiological information. The expected result is Negative.  Fact Sheet for Patients:  EntrepreneurPulse.com.au  Fact Sheet for Healthcare Providers:  IncredibleEmployment.be  This test is no t yet approved or cleared by the [place] FDA and  has been authorized for detection and/or diagnosis of SARS-CoV-2 by FDA under an Emergency Use Authorization (EUA). This EUA will remain  in effect (meaning this test can be used) for the duration of the COVID-19 declaration under Section 564(b)(1) of the Act, 21 U.S.C.section 360bbb-3(b)(1), unless the authorization is terminated  or revoked sooner.       Influenza A by PCR NEGATIVE NEGATIVE Final   Influenza B by PCR NEGATIVE NEGATIVE Final    Comment: (NOTE) The Xpert Xpress SARS-CoV-2/FLU/RSV plus assay is intended as an aid in the diagnosis of influenza from Nasopharyngeal swab specimens and should not be used as a sole basis for treatment. Nasal washings and aspirates are unacceptable for Xpert Xpress SARS-CoV-2/FLU/RSV testing.  Fact Sheet for Patients: EntrepreneurPulse.com.au  Fact Sheet for Healthcare Providers: IncredibleEmployment.be  This test is not yet approved or cleared by the [place] FDA and has been authorized for detection and/or diagnosis of SARS-CoV-2 by FDA under an Emergency Use Authorization (EUA). This EUA will remain in effect (meaning this test can be used) for the duration of the COVID-19 declaration under Section 564(b)(1) of the Act, 21 U.S.C. section 360bbb-3(b)(1), unless  the authorization is terminated or revoked.     Resp Syncytial Virus by PCR NEGATIVE NEGATIVE Final    Comment: (NOTE) Fact Sheet for Patients: BloggerCourse.com  Fact Sheet for Healthcare  Providers: SeriousBroker.it  This test is not yet approved or cleared by the [place] FDA and has been authorized for detection and/or diagnosis of SARS-CoV-2 by FDA under an Emergency Use Authorization (EUA). This EUA will remain in effect (meaning this test can be used) for the duration of the COVID-19 declaration under Section 564(b)(1) of the Act, 21 U.S.C. section 360bbb-3(b)(1), unless the authorization is terminated or revoked.  Performed at [facility], [address] [place], [place] [address]     Labs: CBC: Recent Labs  Lab 01/31/22 1956  WBC 10.9*  NEUTROABS 7.5  HGB 12.5  HCT 38.8  MCV 88.8  PLT 401*   Basic Metabolic Panel: Recent Labs  Lab 01/31/22 1956  NA 134*  K 3.3*  CL 90*  CO2 30  GLUCOSE 154*  BUN 18  CREATININE 0.97  CALCIUM 8.6*   Liver Function Tests: Recent Labs  Lab 01/31/22 1956  AST 18  ALT 10  ALKPHOS 74  BILITOT 0.5  PROT 7.4  ALBUMIN 3.5   CBG: No results for input(s): "GLUCAP" in the last 168 hours.  Discharge time spent: greater than 30 minutes.  Signed: Delfino Lovett, MD [facility] 02/01/2022

## 2022-02-01 NOTE — Assessment & Plan Note (Signed)
Continue metoprolol and aspirin Not on systemic anticoagulation as paroxysmal A-fib was in the setting of acute infection

## 2022-02-01 NOTE — Telephone Encounter (Signed)
I have sent duloxetine 60 mg to [facility] [address]. as per her request by patient's daughter through my chart. Patient is currently hospitalized.  Will advise patient to schedule an appointment prior to future refills.

## 2022-02-01 NOTE — Progress Notes (Signed)
RE: Laura [name] Date of Birth: 08-15-1945 Date: 02/01/22     To Whom It May Concern:   Please be advised that the above-named patient will require a short-term nursing home stay - anticipated 30 days or less for rehabilitation and strengthening.  The plan is for return home

## 2022-02-01 NOTE — Assessment & Plan Note (Signed)
On methotrexate and Remicade and Plaquenil

## 2022-02-01 NOTE — Assessment & Plan Note (Signed)
Continue aspirin, rosuvastatin and metoprolol

## 2022-02-01 NOTE — Consult Note (Signed)
Consult requested by:  Dr. Manuella Ghazi  Consult requested for:  L5 fracture, sacral fracture  Primary Physician:  Adin Hector, MD  History of Present Illness: 02/01/2022 [name] is here today with a chief complaint of fall.  She has significant medical history as noted below.  She had a fall on January 11 and has had worsening pain since that time.  She was sitting on her rollator and the rollator slid out from under her and she fell hard on her buttock.  She had progressive difficulty walking and participating in her activities of daily living.  She had no other significant findings on initial presentation.  On my evaluation today, she noted severe pain when she attempted to sit up on the side of bed.  She was unable to bear weight due to severe pain.  She has noted some difficulty with flexing her ankles over the past several weeks.  This patient is well-known to me, as I have previously seen her in an outpatient setting.  I have utilized the care everywhere function in epic to review the outside records available from external health systems.  Review of Systems:  A 10 point review of systems is negative, except for the pertinent positives and negatives detailed in the HPI.  Past Medical History: Past Medical History:  Diagnosis Date   Allergy    Anxiety    Arthritis, degenerative 10/08/2013   Overview:    a.  Lumbar spine/spinal stenosis/foot drop.   b.  Hands.    Asthma    Chronic hand pain, left 10/10/2019   Chronic kidney disease    Depression    Lumbar spinal stenosis with neurogenic claudication 11/07/2014   Major depression, single episode, in complete remission ([place]) 06/25/2015   Memory loss, short term 03/19/2014   Rheumatoid arthritis ([place])    Sciatica of left side 12/31/2019   Seizure ([place]) 10/07/2014   Sleep apnea    Sleep apnea    Thyroid disease     Past Surgical History: Past Surgical History:  Procedure Laterality Date   ABDOMINAL  HYSTERECTOMY     CESAREAN SECTION     HIP SURGERY Right    REPLACEMENT TOTAL KNEE Left     Allergies: Allergies as of 01/31/2022 - Review Complete 01/31/2022  Allergen Reaction Noted   Bupropion     Jardiance [empagliflozin] Other (See Comments) 01/14/2021   Penicillins Rash 10/07/2014    Medications: Current Meds  Medication Sig   albuterol (VENTOLIN HFA) 108 (90 Base) MCG/ACT inhaler Inhale 2 puffs into the lungs every 6 (six) hours as needed for wheezing or shortness of breath.   aspirin EC 81 MG tablet Take by mouth.   azelastine (ASTELIN) 0.1 % nasal spray Place 1 spray into both nostrils daily as needed for rhinitis.   doxepin (SINEQUAN) 10 MG capsule Take 10 mg by mouth.   DULoxetine (CYMBALTA) 60 MG capsule TAKE 1 CAPSULE BY MOUTH EVERY DAY   folic acid (FOLVITE) 1 MG tablet Take 1 mg by mouth daily.   furosemide (LASIX) 80 MG tablet Take 80 mg by mouth every Monday, Wednesday, and Friday. Takes MWF   hydroxychloroquine (PLAQUENIL) 200 MG tablet Take 100 mg by mouth daily.    levothyroxine (SYNTHROID) 125 MCG tablet Take 250 mcg by mouth daily before breakfast.   loratadine (CLARITIN) 10 MG tablet Take 10 mg by mouth daily.    metoprolol succinate (TOPROL-XL) 100 MG 24 hr tablet Take 1 tablet (100 mg  total) by mouth daily. Take with or immediately following a meal.   Multiple Vitamin (MULTI-VITAMINS) TABS Take 1 tablet by mouth daily.   potassium chloride (KLOR-CON) 10 MEQ tablet Take 10 mEq by mouth every Monday, Wednesday, and Friday.   rosuvastatin (CRESTOR) 5 MG tablet Take 5 mg by mouth 2 (two) times a week.    Social History: Social History   Tobacco Use   Smoking status: Never   Smokeless tobacco: Never  Vaping Use   Vaping Use: Never used  Substance Use Topics   Alcohol use: No    Alcohol/week: 0.0 standard drinks of alcohol   Drug use: No    Family Medical History: Family History  Problem Relation Age of Onset   Hypertension Mother    Stroke Mother     Heart attack Father    Alcohol abuse Father    Depression Father    Post-traumatic stress disorder Father    Rheum arthritis Sister    Hypertension Sister    Depression Sister    Breast cancer Neg Hx     Physical Examination: Vitals:   02/01/22 0800 02/01/22 1618  BP: 125/76 129/80  Pulse: 83 92  Resp: 18 18  Temp: 98.3 F (36.8 C) 98.5 F (36.9 C)  SpO2: 98% 96%    General: Patient is well developed, well nourished, calm, collected, and in no apparent distress. Attention to examination is appropriate.  Neck:   Supple.  Full range of motion.  Respiratory: Patient is breathing without any difficulty.   NEUROLOGICAL:     Awake, alert, oriented to person, place, and time.  Speech is clear and fluent.  Cranial Nerves: Pupils equal round and reactive to light.  Facial tone is symmetric.  Facial sensation is symmetric. Shoulder shrug is symmetric. Tongue protrusion is midline.  There is no pronator drift.  ROM of spine: full.    Strength: Side Biceps Triceps Deltoid Interossei Grip Wrist Ext. Wrist Flex.  R 5 5 5 5 5 5 5   L 5 5 5 5 5 5 5    Side Iliopsoas Quads Hamstring PF DF EHL  R 5 5 5 4 2 2   L 5 5 5 4 2 2    Reflexes are 1+ and symmetric at the biceps, triceps, brachioradialis, patella and achilles.   Hoffman's is absent.   Bilateral upper and lower extremity sensation is intact to light touch.    No evidence of dysmetria noted.  Gait is untested.     Medical Decision Making  Imaging: CT L spine 01/31/2022 IMPRESSION: 1. Acute nondisplaced fracture of the left L5 transverse process. No lumbar compression fracture is seen. 2. Acute nondisplaced insufficiency fractures of the anterior right and left aspects of the sacral ala at S2, extending ventrally across the S2 body with inward cortical buckling. 3. Osteopenia and degenerative changes. 4. Severe acquired spinal canal stenosis at L3-4 and L4-5, moderate at L2-3. 5. Multilevel foraminal stenosis  greatest on the right at L5-S1. 6. Aortic atherosclerosis.   Aortic Atherosclerosis (ICD10-I70.0).     Electronically Signed   By: Telford Nab M.D.   On: 02/01/2022 00:50  I have personally reviewed the images and agree with the above interpretation.  Assessment and Plan: Ms. Mohamed is a pleasant 77 y.o. female with left L5 transverse process fracture with known lumbar stenosis.  She has neurogenic claudication from that.  She has an acute sacral fracture consistent with a sacral insufficiency fracture.  I have reviewed the films with one of  the orthopedic trauma attendings at [facility] who felt that she may benefit from stabilization given that she is having significant difficulty with ambulation due to pain.  I will discuss with Dr. Sherryll Burger so that we may initiate transfer to [facility] for definitive surgical intervention.  Pelvic stabilization is not offered at this facility, so transfer will be necessary for higher level of care where orthopedic trauma specialists are available.   I have communicated my recommendations to the requesting physician and coordinated care to facilitate these recommendations.     Pranathi Winfree K. Myer Haff MD, [facility] Neurosurgery

## 2022-02-01 NOTE — Assessment & Plan Note (Signed)
Sliding scale insulin coverage

## 2022-02-01 NOTE — Final Progress Note (Signed)
Same day rounding progress note  Patient seen and examined.  Please see assessment and plan dictated in H&P by Dr. Damita Dunnings.  I agree with her assessment and plan.  Sacral insufficiency fracture L5 transverse process fracture secondary to mechanical fall Intractable back pain with ambulatory dysfunction Chronic back pain on chronic opiates Chronic physical deconditioning Patient is having a lot of pain and requiring significant help which she does not like and is frustrated as she is becoming dependent on others. PT evaluated the patient but patient's mobility is significantly limited due to her pain despite receiving pain medication before starting therapy.  She could not even get up from sitting position per PT evaluation Consult neurosurgery-discussed with Dr. Izora Ribas who will see her  Family/daughter Lattie Haw was updated over phone.  She is satisfied with management plan.  She has requested palliative care evaluation - I have requested consultation.  Time spent - 35 minutes

## 2022-02-01 NOTE — Final Progress Note (Addendum)
Transfer process initiated for [facility]. D/w Dr Mardelle Matte (ortho), added him and Dr Doreatha Martin to the treatment team so can populate to their list. I've updated her daughter.

## 2022-02-01 NOTE — TOC Initial Note (Signed)
Transition of Care [facility]) - Initial/Assessment Note    Patient Details  Name: Laura [name] MRN: [id number] Date of Birth: [birth year]  [facility]) CM/SW Contact:    Chapman Fitch, RN Phone Number: 02/01/2022, 4:18 PM  Clinical Narrative:                  Admitted RJJ:OACZYS insufficiency fracture L5 transverse process fracture secondary to mechanical fall Admitted from: Home with husband. [facility] 3 days a week. Open with [facility] for PT and OT PCP: Graciela Husbands Current home health/prior home health/DME: [facility], hospital bed, WC, rollator  Per daughter baseline patient is able to stand pivot transfer Therapy recommending SNF Per daughter Marisue Ivan only wants referral sent to [facility], [facility], [facility].  First choice is [facility]  Messages sent to andrea at [facility] to request review PASRR pending, fl2 and 30 day note sent to MD to cosign          Patient Goals and CMS Choice            Expected Discharge Plan and Services                                              Prior Living Arrangements/Services                       Activities of Daily Living Home Assistive Devices/Equipment: Bedside commode/3-in-1 ADL Screening (condition at time of admission) Patient's cognitive ability adequate to safely complete daily activities?: Yes Is the patient deaf or have difficulty hearing?: No Does the patient have difficulty seeing, even when wearing glasses/contacts?: No Does the patient have difficulty concentrating, remembering, or making decisions?: No Patient able to express need for assistance with ADLs?: Yes Does the patient have difficulty dressing or bathing?: Yes Independently performs ADLs?: No Communication: Independent Dressing (OT): Needs assistance Is this a change from baseline?: Pre-admission baseline Grooming: Needs assistance Is this a change from baseline?: Pre-admission baseline Feeding:  Independent Bathing: Needs assistance Is this a change from baseline?: Pre-admission baseline Toileting: Needs assistance Is this a change from baseline?: Pre-admission baseline In/Out Bed: Needs assistance Is this a change from baseline?: Pre-admission baseline Walks in Home: Needs assistance Is this a change from baseline?: Pre-admission baseline Does the patient have difficulty walking or climbing stairs?: Yes Weakness of Legs: Both Weakness of Arms/Hands: Both  Permission Sought/Granted                  Emotional Assessment              Admission diagnosis:  Intractable back pain [M54.9] Midline low back pain, unspecified chronicity, unspecified whether sciatica present [M54.50] Spinal stenosis at L4-L5 level [M48.061] Patient Active Problem List   Diagnosis Date Noted   Midline low back pain 02/01/2022   Sacral insufficiency fracture 02/01/2022   Fracture of transverse process of lumbar vertebra, closed, initial encounter (HCC) 02/01/2022   Chronic back pain 02/01/2022   Ambulatory dysfunction 02/01/2022   Fall at home, initial encounter 02/01/2022   Spinal stenosis at L4-L5 level 02/01/2022   Lymphedema 04/26/2021   Generalized osteoarthritis of multiple sites 12/09/2020   Chronic low back pain (1ry area of Pain) (Bilateral) (L>R) w/o sciatica 12/09/2020   Ligamentum flavum hypertrophy (L1-2, L2-3, L3-4) 05/28/2020   Lumbar lateral recess stenosis (Bilateral) (L2-3,  L3-4, L4-5) (Severe: L3-4) 05/28/2020   Lumbar Grade 1 Anterolisthesis of L3/L4 and L4/L5 05/28/2020   Intractable back pain 05/28/2020   Chronic use of opiate for therapeutic purpose 04/14/2020   Statin myopathy 03/20/2020   DDD (degenerative disc disease), lumbosacral 03/10/2020   Abnormal MRI, lumbar spine (04/30/2021) 02/19/2020   Chronic lower extremity pain (2ry area of Pain) (Bilateral) 02/04/2020   Lumbosacral radiculopathy at L5 (Left) 02/04/2020   Chronic anticoagulation ([place])  01/20/2020   Cerebrovascular accident (CVA) ([place]) 01/02/2020   Sciatica (Left) 12/31/2019   Dropfoot (Left) 11/20/2019   Weakness of foot (Left) 11/20/2019   Weakness of leg (Left) 11/20/2019   Foot drop (Left) 11/20/2019   Chronic hand pain (Left) 10/10/2019   MDD (major depressive disorder), recurrent, in full remission ([place]) 11/22/2018   Paroxysmal atrial fibrillation ([place]) 10/16/2018   Coronary artery disease involving native coronary artery of native heart without angina pectoris 10/15/2018   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   Pneumonia 10/06/2018   Immunosuppression ([place]) 09/18/2018   Pharmacologic therapy 07/11/2018   Disorder of skeletal system 07/11/2018   Problems influencing health status 07/11/2018   MDD (major depressive disorder), recurrent episode, mild (HCC) 07/09/2018   Primary insomnia 07/09/2018   Bereavement 07/09/2018   Body mass index (BMI) of 40.0-44.9 in adult ([place]) 10/09/2017   Postmenopausal 04/06/2017   Chronic arthralgias of knees and hips (Right) 08/18/2016   Arthritis 07/11/2016   Dyspnea on exertion 07/11/2016   Kidney disease 07/11/2016   Fibromyalgia syndrome 07/11/2016   Snoring 07/11/2016   Morbid obesity with BMI of 40.0-44.9, adult ([place]) 07/11/2016   Opioid-induced constipation (OIC) 04/21/2016   Osteoarthritis of knee (Right) 01/25/2016   Diet-controlled diabetes mellitus ([place]) 10/03/2015   GAD (generalized anxiety disorder) 09/24/2015   Disturbance of skin sensation 07/15/2015   Elevated sedimentation rate 07/15/2015   Elevated C-reactive protein (CRP) 07/15/2015   Chronic hip pain (Left) 04/06/2015   History of TKR (total knee replacement) (Left) 02/09/2015   History of femur fracture (Right) 02/09/2015   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) 02/09/2015   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) 02/09/2015   Lumbar foraminal stenosis (Bilateral L3-4 and L5-S1) 02/09/2015   Chronic low back pain (1ry area  of Pain) (Bilateral) (L>R) w/ sciatica (Bilateral) 01/22/2015   Chronic pain syndrome 52/77/8242   Uncomplicated opioid dependence ([place]) 11/07/2014   Lumbar spinal stenosis (5 mm Severe L3-4; 8 mm L4-5) w/ neurogenic claudication 11/07/2014   Lumbar spondylosis 11/07/2014   Lumbar facet syndrome (Bilateral) (L>R) 11/07/2014   Chronic knee pain (Right) 11/05/2014   Opiate use (30 MME/Day) 11/05/2014   Long term current use of opiate analgesic 11/05/2014   Long term prescription opiate use 11/05/2014   Encounter for therapeutic drug level monitoring 11/05/2014   Morbid obesity ([place]) 10/27/2014   Extreme obesity ([place]) 10/07/2014   Type 2 diabetes mellitus ([place]) 03/19/2014   Depressive disorder 10/14/2013   Essential (primary) hypertension 10/14/2013   Insomnia secondary to chronic pain 07/09/2013   Anxiety 07/09/2013   GERD (gastroesophageal reflux disease) 01/18/2013   Hypothyroidism 01/18/2013   Hypercholesterolemia 01/18/2013   Seronegative rheumatoid arthritis ([place]) 01/18/2013   CKD (chronic kidney disease) stage 3, GFR 30-59 ml/min (HCC) 01/17/2013   Congestive heart failure with left ventricular systolic dysfunction ([place]) 11/10/2012   PCP:  Adin Hector, MD Pharmacy:   [id number] [address], [place] [address] Phone: [phone] Fax: [phone]  Social Determinants of Health (SDOH) Social History: SDOH Screenings   Food Insecurity: No Food Insecurity (02/01/2022)  Housing: Low Risk  (02/01/2022)  Transportation Needs: No Transportation Needs (02/01/2022)  Utilities: Not At Risk (02/01/2022)  Depression (PHQ2-9): Low Risk  (10/21/2021)  Financial Resource Strain: Low Risk  (11/15/2016)  Physical Activity: Inactive (11/15/2016)  Social Connections: Somewhat Isolated (11/15/2016)  Stress: No Stress Concern Present (11/15/2016)  Tobacco Use: Low Risk  (01/31/2022)   SDOH Interventions:     Readmission Risk  Interventions     No data to display

## 2022-02-01 NOTE — Evaluation (Addendum)
Physical Therapy Evaluation Patient Details Name: Laura [name] MRN: [id number] DOB: [birth year] Today's Date: 02/01/2022  History of Present Illness  Patient is a 77 year old female with progressively worsening back pain and fall on 1/11, difficulty mobilizing at home. Sacral insufficiency fracture, L5 transverse process fracture. History of chronic back pain   Clinical Impression  Patient is agreeable to PT evaluation. She reports she walks with a four wheeled walker at baseline with only this one fall in the past 6 months. She lives with her spouse and has a daughter near by as well. She reports being essentially bed bound at home over the past several days secondary to low back pain following her fall at home.  Today, the patient is severely limited in mobility by lower back pain/sacral area pain that is worse with any movement of BLE or moving in the bed. Attempted to sit on the edge bed with significant assistance. Due to back pain, she was unable to tolerate her trunk being upright more than around 50 degrees and was unable to achieve upright sitting position. She also has pain just with rolling in bed. Unable to assess transfers or ambulation this session. Recommend to continue PT to maximize independence and decrease caregiver burden. Anticipate patient will require SNF for short term rehab following this hospital stay.      Recommendations for follow up therapy are one component of a multi-disciplinary discharge planning process, led by the attending physician.  Recommendations may be updated based on patient status, additional functional criteria and insurance authorization.  Follow Up Recommendations Skilled nursing-short term rehab (<3 hours/day) Can patient physically be transported by private vehicle: No    Assistance Recommended at Discharge Frequent or constant Supervision/Assistance  Patient can return home with the following  Two people to help with walking and/or  transfers;A lot of help with bathing/dressing/bathroom;Help with stairs or ramp for entrance;Assist for transportation;Assistance with cooking/housework    Equipment Recommendations  (to be determined at next level of care)  Recommendations for Other Services       Functional Status Assessment Patient has had a recent decline in their functional status and demonstrates the ability to make significant improvements in function in a reasonable and predictable amount of time.     Precautions / Restrictions Precautions Precautions: Fall Precaution Comments: patient cleared to mobilize as tolerated per secure chat with MD Restrictions Weight Bearing Restrictions: No      Mobility  Bed Mobility Overal bed mobility: Needs Assistance Bed Mobility: Rolling, Supine to Sit Rolling: Max assist   Supine to sit: Max assist, HOB elevated     General bed mobility comments: the patient was able to sit upright  to around 50 degrees and then unable to go further secondary to increased lower back/sacral area pain. significant assistance is required with all mobility. increased pain also reported just with rolling in bed. anticipate patient could benefit from +2 person assistance    Transfers                   General transfer comment: unable to attempt as patient unable to tolerate sitting upright due to increased lower back pain. patient reports being essentially bed bound since the fall due to the pain    Ambulation/Gait                  Stairs            Wheelchair Mobility    Modified Rankin (Stroke Patients Only)  Balance Overall balance assessment: History of Falls                                           Pertinent Vitals/Pain Pain Assessment Pain Assessment: Faces Faces Pain Scale: Hurts whole lot Pain Location: lower back/sacral area with shooting pain down bilateral buttocks with movement Pain Descriptors / Indicators:  Grimacing, Guarding, Sore Pain Intervention(s): Limited activity within patient's tolerance, Monitored during session, Repositioned, Premedicated before session    Home Living Family/patient expects to be discharged to:: Private residence Living Arrangements: Spouse/significant other Available Help at Discharge: Family Type of Home: House Home Access: Stairs to enter   Technical brewer of Steps: 2   Home Layout: One level Home Equipment: Rollator (4 wheels) (adjustable bed)      Prior Function Prior Level of Function : History of Falls (last six months);Independent/Modified Independent;Driving             Mobility Comments: patient reports using a 4 wheeled walker. She reports she can drive but has not driven recently. She reports only this one fall in the past 6 months       Hand Dominance        Extremity/Trunk Assessment   Upper Extremity Assessment Upper Extremity Assessment: Overall WFL for tasks assessed    Lower Extremity Assessment Lower Extremity Assessment: Generalized weakness (patient has pain with AROM and PROM of BLE. generalized weakness throughout with history of peripherial neuropathy distally)       Communication   Communication: No difficulties  Cognition Arousal/Alertness: Awake/alert Behavior During Therapy: WFL for tasks assessed/performed Overall Cognitive Status: Within Functional Limits for tasks assessed                                          General Comments      Exercises Other Exercises Other Exercises: patient able to perform heelslide with AAROM,  with assistance required due to large lower body habitus, generalized weakness, and increased low back pain with any movement   Assessment/Plan    PT Assessment Patient needs continued PT services  PT Problem List Decreased strength;Decreased range of motion;Decreased activity tolerance;Decreased balance;Decreased mobility;Pain       PT Treatment  Interventions DME instruction;Gait training;Stair training;Functional mobility training;Therapeutic activities;Therapeutic exercise;Balance training;Neuromuscular re-education;Cognitive remediation;Patient/family education    PT Goals (Current goals can be found in the Care Plan section)  Acute Rehab PT Goals Patient Stated Goal: to have less pain PT Goal Formulation: With patient Time For Goal Achievement: 02/15/22 Potential to Achieve Goals: Fair    Frequency Min 2X/week     Co-evaluation               AM-PAC PT "6 Clicks" Mobility  Outcome Measure Help needed turning from your back to your side while in a flat bed without using bedrails?: A Lot Help needed moving from lying on your back to sitting on the side of a flat bed without using bedrails?: Total Help needed moving to and from a bed to a chair (including a wheelchair)?: Total Help needed standing up from a chair using your arms (e.g., wheelchair or bedside chair)?: Total Help needed to walk in hospital room?: Total Help needed climbing 3-5 steps with a railing? : Total 6 Click Score: 7    End  of Session   Activity Tolerance: Patient limited by pain Patient left: in bed;with call bell/phone within reach;with bed alarm set Nurse Communication: Mobility status (also alerted MD via secure chat with Dr Manuella Ghazi and Dr Izora Ribas) PT Visit Diagnosis: Pain;Muscle weakness (generalized) (M62.81) Pain - Right/Left:  (right and left) Pain - part of body:  (lower back/sacral area)    Time: 1610-9604 PT Time Calculation (min) (ACUTE ONLY): 18 min   Charges:   PT Evaluation $PT Eval Low Complexity: 1 Low PT Treatments $Therapeutic Activity: 8-22 mins        Minna Merritts, PT, MPT   Percell Locus 02/01/2022, 3:27 PM

## 2022-02-01 NOTE — ED Provider Notes (Addendum)
-----------------------------------------   1:52 AM on 02/01/2022 -----------------------------------------   I took over care of this patient from Dr. Quentin Cornwall.  CT shows an L5 transverse process fracture and sacral insufficiency fracture.  There is also some concern for possible proctitis although this may be inflammatory change related to the sacral fracture.  On reassessment, the patient denies any diarrhea, blood in the stool, or rectal pain.  Clinically she has no symptoms of acute proctitis.  She has not had any recurrent fever or other focal symptoms so I did not start antibiotics.  Given her acute on chronic pain and difficulty with basic movements and ADLs especially in the context of the acute findings seen on CT, she will need admission for further management.  I consulted Dr. Damita Dunnings from the hospitalist service; based on our discussion she agrees to admit the patient.   Arta Silence, MD 02/01/22 [id number]    Arta Silence, MD 02/01/22 [phone]

## 2022-02-01 NOTE — NC FL2 (Signed)
[place] LEVEL OF CARE FORM     IDENTIFICATION  Patient Name: Laura [name] Birthdate: 12-Oct-1945 Sex: female Admission Date (Current Location): 01/31/2022  [facility] and [place] Number:  Engineering geologist and Address:         Provider Number: [id number]  Attending Physician Name and Address:  Max Sane, MD  Relative Name and Phone Number:       Current Level of Care: Hospital Recommended Level of Care: [place] Prior Approval Number:    Date Approved/Denied:   PASRR Number: pending  Discharge Plan: SNF    Current Diagnoses: Patient Active Problem List   Diagnosis Date Noted   Midline low back pain 02/01/2022   Sacral insufficiency fracture 02/01/2022   Fracture of transverse process of lumbar vertebra, closed, initial encounter ([place]) 02/01/2022   Chronic back pain 02/01/2022   Ambulatory dysfunction 02/01/2022   Fall at home, initial encounter 02/01/2022   Spinal stenosis at L4-L5 level 02/01/2022   Lymphedema 04/26/2021   Generalized osteoarthritis of multiple sites 12/09/2020   Chronic low back pain (1ry area of Pain) (Bilateral) (L>R) w/o sciatica 12/09/2020   Ligamentum flavum hypertrophy (L1-2, L2-3, L3-4) 05/28/2020   Lumbar lateral recess stenosis (Bilateral) (L2-3, L3-4, L4-5) (Severe: L3-4) 05/28/2020   Lumbar Grade 1 Anterolisthesis of L3/L4 and L4/L5 05/28/2020   Intractable back pain 05/28/2020   Chronic use of opiate for therapeutic purpose 04/14/2020   Statin myopathy 03/20/2020   DDD (degenerative disc disease), lumbosacral 03/10/2020   Abnormal MRI, lumbar spine (04/30/2021) 02/19/2020   Chronic lower extremity pain (2ry area of Pain) (Bilateral) 02/04/2020   Lumbosacral radiculopathy at L5 (Left) 02/04/2020   Chronic anticoagulation ([place]) 01/20/2020   Cerebrovascular accident (CVA) ([place]) 01/02/2020   Sciatica (Left) 12/31/2019   Dropfoot (Left) 11/20/2019   Weakness of foot (Left) 11/20/2019    Weakness of leg (Left) 11/20/2019   Foot drop (Left) 11/20/2019   Chronic hand pain (Left) 10/10/2019   MDD (major depressive disorder), recurrent, in full remission ([place]) 11/22/2018   Paroxysmal atrial fibrillation ([place]) 10/16/2018   Coronary artery disease involving native coronary artery of native heart without angina pectoris 10/15/2018   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) 07/09/2018   Primary insomnia 07/09/2018   Bereavement 07/09/2018   Body mass index (BMI) of 40.0-44.9 in adult ([place]) 10/09/2017   Postmenopausal 04/06/2017   Chronic arthralgias of knees and hips (Right) 08/18/2016   Arthritis 07/11/2016   Dyspnea on exertion 07/11/2016   Kidney disease 07/11/2016   Fibromyalgia syndrome 07/11/2016   Snoring 07/11/2016   Morbid obesity with BMI of 40.0-44.9, adult ([place]) 07/11/2016   Opioid-induced constipation ([place]IC) 04/21/2016   Osteoarthritis of knee (Right) 01/25/2016   Diet-controlled diabetes mellitus ([place]) 10/03/2015   GAD (generalized anxiety disorder) 09/24/2015   Disturbance of skin sensation 07/15/2015   Elevated sedimentation rate 07/15/2015   Elevated C-reactive protein (CRP) 07/15/2015   Chronic hip pain (Left) 04/06/2015   History of TKR (total knee replacement) (Left) 02/09/2015   History of femur fracture (Right) 02/09/2015   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) 02/09/2015   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) 02/09/2015   Lumbar foraminal stenosis (Bilateral L3-4 and L5-S1) 02/09/2015   Chronic low back pain (1ry area of Pain) (Bilateral) (L>R)  w/ sciatica (Bilateral) 01/22/2015   Chronic pain syndrome 09/38/1829   Uncomplicated opioid dependence ([place]) 11/07/2014   Lumbar  spinal stenosis (5 mm Severe L3-4; 8 mm L4-5) w/ neurogenic claudication 11/07/2014   Lumbar spondylosis 11/07/2014   Lumbar facet syndrome (Bilateral) (L>R) 11/07/2014   Chronic knee pain (Right) 11/05/2014   Opiate use (30 MME/Day) 11/05/2014   Long term current use of opiate analgesic 11/05/2014   Long term prescription opiate use 11/05/2014   Encounter for therapeutic drug level monitoring 11/05/2014   Morbid obesity ([place]) 10/27/2014   Extreme obesity ([place]) 10/07/2014   Type 2 diabetes mellitus ([place]) 03/19/2014   Depressive disorder 10/14/2013   Essential (primary) hypertension 10/14/2013   Insomnia secondary to chronic pain 07/09/2013   Anxiety 07/09/2013   GERD (gastroesophageal reflux disease) 01/18/2013   Hypothyroidism 01/18/2013   Hypercholesterolemia 01/18/2013   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   CKD (chronic kidney disease) stage 3, GFR 30-59 ml/min (HCC) 01/17/2013   Congestive heart failure with left ventricular systolic dysfunction ([place]) 11/10/2012    Orientation RESPIRATION BLADDER Height & Weight     Self, Situation, Time, Place  Normal Incontinent Weight: 108.9 kg Height:  5\' 5"  (165.1 cm)  BEHAVIORAL SYMPTOMS/MOOD NEUROLOGICAL BOWEL NUTRITION STATUS      Continent Diet (Heart Healthy)  AMBULATORY STATUS COMMUNICATION OF NEEDS Skin   Extensive Assist Verbally Skin abrasions, Bruising                       Personal Care Assistance Level of Assistance              Functional Limitations Info  Sight, Hearing Sight Info: Impaired Hearing Info: Impaired      SPECIAL CARE FACTORS FREQUENCY  PT (By licensed PT), OT (By licensed OT)                    Contractures Contractures Info: Not present    Additional Factors Info  Code Status, Allergies Code Status Info: Full Allergies Info: Bupropion, Jardiance (Empagliflozin), Penicillins           Current Medications (02/01/2022):  This is the current hospital active  medication list Current Facility-Administered Medications  Medication Dose Route Frequency Provider Last Rate Last Admin   acetaminophen (TYLENOL) tablet 650 mg  650 mg Oral Q6H PRN Athena Masse, MD       Or   acetaminophen (TYLENOL) suppository 650 mg  650 mg Rectal Q6H PRN Athena Masse, MD       albuterol (PROVENTIL) (2.5 MG/3ML) 0.083% nebulizer solution 2.5 mg  2.5 mg Nebulization Q6H PRN Renda Rolls, RPH       aspirin EC tablet 81 mg  81 mg Oral Daily Judd Gaudier V, MD   81 mg at 02/01/22 1356   DULoxetine (CYMBALTA) DR capsule 60 mg  60 mg Oral Daily Judd Gaudier V, MD   60 mg at 02/01/22 1357   enoxaparin (LOVENOX) injection 55 mg  0.5 mg/kg Subcutaneous Q24H Judd Gaudier V, MD   55 mg at 02/01/22 1357   hydroxychloroquine (PLAQUENIL) tablet 100 mg  100 mg Oral Daily Judd Gaudier V, MD   100 mg at 02/01/22 1357   [START ON 02/02/2022] influenza vaccine adjuvanted (FLUAD) injection 0.5 mL  0.5 mL Intramuscular Tomorrow-1000 Athena Masse, MD       ketorolac (TORADOL) 15 MG/ML injection 15 mg  15 mg Intravenous Q6H PRN Athena Masse, MD   15 mg  at 02/01/22 0423   levothyroxine (SYNTHROID) tablet 250 mcg  250 mcg Oral QHS Lowella Bandy, RPH       metoprolol succinate (TOPROL-XL) 24 hr tablet 100 mg  100 mg Oral Daily Lindajo Royal V, MD   100 mg at 02/01/22 1356   morphine (PF) 2 MG/ML injection 2 mg  2 mg Intravenous Q2H PRN Andris Baumann, MD   2 mg at 02/01/22 0808   ondansetron (ZOFRAN) tablet 4 mg  4 mg Oral Q6H PRN Andris Baumann, MD       Or   ondansetron [facility]) injection 4 mg  4 mg Intravenous Q6H PRN Andris Baumann, MD       oxyCODONE (Oxy IR/ROXICODONE) immediate release tablet 5 mg  5 mg Oral Q6H PRN Andris Baumann, MD   5 mg at 02/01/22 1356   [START ON 02/03/2022] rosuvastatin (CRESTOR) tablet 5 mg  5 mg Oral Once per day on Mon Thu Duncan, Odetta Pink, MD         Discharge Medications: Please see discharge summary for a list of discharge  medications.  Relevant Imaging Results:  Relevant Lab Results:   Additional Information ss [id number]  Chapman Fitch, RN

## 2022-02-01 NOTE — Assessment & Plan Note (Signed)
Continue home duloxetine, hydroxyzine and doxepin

## 2022-02-01 NOTE — H&P (Addendum)
History and Physical    Patient: Laura [name] BHA:[id number] DOB: [birth year] DOA: 01/31/2022 DOS: the patient was seen and examined on 02/01/2022 PCP: Lynnea Ferrier, MD  Patient coming from: Home  Chief Complaint:  Chief Complaint  Patient presents with   Fall    HPI: Laura [name] is a 77 y.o. female with medical history significant for HTN, hypothyroidism, rheumatoid arthritis on immunosuppressants, paroxysmal A-fib in the setting of acute infection, not on anticoagulation, chronic back pain on chronic opiates, anxiety and depression, who presents to the ED for progressively worsening back pain since sustaining a fall on 1/11.  Patient was sitting on her rollator and the rollator slid out from under her and she fell onto her buttock.  She is now unable to ambulate around the house and unable to participate in activities of daily living.  She denied preceding lightheadedness, palpitations, chest pain or shortness  of breath, visual disturbance or one-sided weakness numbness or tingling.  She was not previously ill and denies cough, fever or chills, vomiting, diarrhea, abdominal pain or dysuria.  On arrival however, she was noted to have a fever of 101 with otherwise normal vitals.  The fever defervesced without intervention.  Labs were remarkable for WBC of 10,900 with lactic acid 1.4.  Respiratory viral panel negative for COVID flu and RSV.  EKG showed NSR with no acute ischemic changes.  Chest x-ray nonacute.  CT abdomen and pelvis with contrast significant for sacral insufficiency fracture and fracture of transverse process of L5 but otherwise nonacute.  Lower extremity Doppler negative for DVT.  Patient was treated with oxycodone and hospitalist consulted for admission due to intractable pain with ambulatory dysfunction   Review of Systems: As mentioned in the history of present illness. All other systems reviewed and are negative.  Past Medical History:  Diagnosis Date   Allergy     Anxiety    Arthritis, degenerative 10/08/2013   Overview:    a.  Lumbar spine/spinal stenosis/foot drop.   b.  Hands.    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   Memory loss, short term 03/19/2014   Rheumatoid arthritis (HCC)    Sciatica of left side 12/31/2019   Seizure (HCC) 10/07/2014   Sleep apnea    Sleep apnea    Thyroid disease    Past Surgical History:  Procedure Laterality Date   ABDOMINAL HYSTERECTOMY     CESAREAN SECTION     HIP SURGERY Right    REPLACEMENT TOTAL KNEE Left    Social History:  reports that she has never smoked. She has never used smokeless tobacco. She reports that she does not drink alcohol and does not use drugs.  Allergies  Allergen Reactions   Bupropion    Jardiance [Empagliflozin] Other (See Comments)   Penicillins Rash    Family History  Problem Relation Age of Onset   Hypertension Mother    Stroke Mother    Heart attack Father    Alcohol abuse Father    Depression Father    Post-traumatic stress disorder Father    Rheum arthritis Sister    Hypertension Sister    Depression Sister    Breast cancer Neg Hx     Prior to Admission medications   Medication Sig Start Date End Date Taking? Authorizing Provider  albuterol (VENTOLIN HFA) 108 (90 Base) MCG/ACT inhaler Inhale 2 puffs into the lungs every 6 (six) hours as needed for wheezing or shortness of breath. 04/30/19   Kendell Bane, NP  aspirin EC 81 MG tablet Take by mouth.    [provider]  azelastine (ASTELIN) 0.1 % nasal spray Place into the nose. 02/26/19   [provider]  Cholecalciferol (VITAMIN D) 2000 UNITS tablet Take by mouth daily.     [provider]  doxepin (SINEQUAN) 10 MG capsule Take 10 mg by mouth.    [provider]  DULoxetine (CYMBALTA)  60 MG capsule TAKE 1 CAPSULE BY MOUTH EVERY DAY 07/20/21   Ursula Alert, MD  folic acid (FOLVITE) 1 MG tablet Take 1 mg by mouth daily.    [provider]  furosemide (LASIX) 80 MG tablet Take 80 mg by mouth daily. 09/16/21   [provider]  hydroxychloroquine (PLAQUENIL) 200 MG tablet Take 100 mg by mouth daily.     [provider]  hydrOXYzine (VISTARIL) 25 MG capsule Take 3 capsules (75 mg total) by mouth at bedtime as needed. 07/20/21   Ursula Alert, MD  inFLIXimab (REMICADE) 100 MG injection Inject 100 mg into the vein every 8 (eight) weeks.    [provider]  levothyroxine (SYNTHROID) 50 MCG tablet Take 200 mcg by mouth at bedtime. 07/09/18   [provider]  loratadine (CLARITIN) 10 MG tablet Take 10 mg by mouth daily.  04/05/14   [provider]  methotrexate (RHEUMATREX) 2.5 MG tablet TAKE 4 TABLETS (10 MG TOTAL) BY MOUTH EVERY 7 (SEVEN) DAYS WITH A MEAL 03/15/16   [provider]  metoprolol succinate (TOPROL-XL) 100 MG 24 hr tablet Take 1 tablet (100 mg total) by mouth daily. Take with or immediately following a meal. 10/10/18   Mayo, Pete Pelt, MD  Multiple Vitamin (MULTI-VITAMINS) TABS Take by mouth.    [provider]  naloxone [facility]) nasal spray 4 mg/0.1 mL Place 1 spray into the nose as needed for up to 365 doses (for opioid-induced respiratory depresssion). In case of emergency (overdose), spray once into each nostril. If no response within 3 minutes, repeat application and call 270. 12/22/21 12/22/22  Milinda Pointer, MD  ondansetron (ZOFRAN) 4 MG tablet Take 1 tablet (4 mg total) by mouth every 8 (eight) hours as needed. 01/14/21   Nance Pear, MD  oxyCODONE (OXY IR/ROXICODONE) 5 MG immediate release tablet Take 1 tablet (5 mg total) by mouth every 6 (six) hours as needed for severe pain. Must last 30 days 01/14/22 02/13/22  Milinda Pointer, MD  oxyCODONE (OXY IR/ROXICODONE) 5 MG immediate release tablet  Take 1 tablet (5 mg total) by mouth every 6 (six) hours as needed for severe pain. Must last 30 days 02/13/22 03/15/22  Milinda Pointer, MD  oxyCODONE (OXY IR/ROXICODONE) 5 MG immediate release tablet Take 1 tablet (5 mg total) by mouth every 6 (six) hours as needed for severe pain. Must last 30 days 03/15/22 04/14/22  Milinda Pointer, MD  rosuvastatin (CRESTOR) 5 MG tablet Take 5 mg by mouth 2 (two) times a week. 12/23/18   [provider]    Physical Exam: Vitals:   01/31/22 1947 01/31/22 2200 02/01/22 0008 02/01/22 0209  BP:  (!) 115/54 (!) 120/50 (!) 134/105  Pulse:  86 83   Resp:  18 18 20   Temp:  98.4 F (36.9 C)  98 F (36.7 C)  TempSrc:  Oral  Oral  SpO2:  100% 100% 94%  Weight: 108.9 kg     Height: 5\' 5"  (1.651 m)      Physical Exam Vitals and nursing note reviewed.  Constitutional:      General: She is not in acute distress. HENT:     Head: Normocephalic and atraumatic.  Cardiovascular:     Rate and Rhythm: Normal rate and regular rhythm.     Heart sounds: Normal heart sounds.  Pulmonary:     Effort: Pulmonary effort is normal.     Breath sounds: Normal breath sounds.  Abdominal:     Palpations: Abdomen is soft.     Tenderness: There is no abdominal tenderness.  Neurological:     Mental Status: Mental status is at baseline.     Labs on Admission: I have personally reviewed following labs and imaging studies  CBC: Recent Labs  Lab 01/31/22 1956  WBC 10.9*  NEUTROABS 7.5  HGB 12.5  HCT 38.8  MCV 88.8  PLT 671*   Basic Metabolic Panel: Recent Labs  Lab 01/31/22 1956  NA 134*  K 3.3*  CL 90*  CO2 30  GLUCOSE 154*  BUN 18  CREATININE 0.97  CALCIUM 8.6*   GFR: Estimated Creatinine Clearance: 59.7 mL/min (by C-G formula based on SCr of 0.97 mg/dL). Liver Function Tests: Recent Labs  Lab 01/31/22 1956  AST 18  ALT 10  ALKPHOS 74  BILITOT 0.5  PROT 7.4  ALBUMIN 3.5   No results for input(s): "LIPASE", "AMYLASE" in the last 168  hours. No results for input(s): "AMMONIA" in the last 168 hours. Coagulation Profile: No results for input(s): "INR", "PROTIME" in the last 168 hours. Cardiac Enzymes: No results for input(s): "CKTOTAL", "CKMB", "CKMBINDEX", "TROPONINI" in the last 168 hours. BNP (last 3 results) No results for input(s): "PROBNP" in the last 8760 hours. HbA1C: No results for input(s): "HGBA1C" in the last 72 hours. CBG: No results for input(s): "GLUCAP" in the last 168 hours. Lipid Profile: No results for input(s): "CHOL", "HDL", "LDLCALC", "TRIG", "CHOLHDL", "LDLDIRECT" in the last 72 hours. Thyroid Function Tests: No results for input(s): "TSH", "T4TOTAL", "FREET4", "T3FREE", "THYROIDAB" in the last 72 hours. Anemia Panel: No results for input(s): "VITAMINB12", "FOLATE", "FERRITIN", "TIBC", "IRON", "RETICCTPCT" in the last 72 hours. Urine analysis:    Component Value Date/Time   COLORURINE YELLOW (A) 01/15/2021 0040   APPEARANCEUR CLEAR (A) 01/15/2021 0040   LABSPEC >1.046 (H) 01/15/2021 0040   PHURINE 5.0 01/15/2021 0040   GLUCOSEU NEGATIVE 01/15/2021 0040   HGBUR NEGATIVE 01/15/2021 0040   BILIRUBINUR NEGATIVE 01/15/2021 0040   KETONESUR NEGATIVE 01/15/2021 0040   PROTEINUR NEGATIVE 01/15/2021 0040   NITRITE NEGATIVE 01/15/2021 0040   LEUKOCYTESUR NEGATIVE 01/15/2021 0040    Radiological Exams on Admission: US Venous Img Lower Bilateral  Result Date: 02/01/2022 CLINICAL DATA:  Bilateral leg swelling EXAM: BILATERAL LOWER EXTREMITY VENOUS DOPPLER ULTRASOUND TECHNIQUE: Gray-scale sonography with compression, as well as color and duplex ultrasound, were performed to evaluate the deep venous system(s) from the level of the common femoral vein through the popliteal and proximal calf veins. COMPARISON:  None Available. FINDINGS: VENOUS Normal compressibility of the common femoral, superficial femoral, and popliteal veins. The calf veins are not well visualized bilaterally due to the patient's body  habitus. Visualized portions of profunda femoral vein and great saphenous vein unremarkable. No filling defects to suggest DVT on grayscale or color Doppler imaging. Doppler waveforms show normal direction of venous flow, normal respiratory plasticity and response to augmentation. OTHER None.  Limitations: none IMPRESSION: 1. No evidence of femoropopliteal DVT bilaterally Electronically Signed   By: Helyn Numbers M.D.   On: 02/01/2022 01:48   CT ABDOMEN PELVIS W CONTRAST  Addendum Date: 02/01/2022   ADDENDUM REPORT: 02/01/2022 00:53 ADDENDUM: In retrospect, there are nondisplaced insufficiency fractures of the anterior right and left aspects of the sacral ala at S2, with fracture continuing across the ventral S2 body where there is inward cortical buckling. There is also a nondisplaced fracture of the left L5 transverse process. The sacral fracture likely accounts for the minimal presacral fluid and stranding noted, although the patient may still have a mild proctitis. Electronically Signed   By: Almira Bar M.D.   On: 02/01/2022 00:53   Result Date: 02/01/2022 CLINICAL DATA:  Fall injury January 11th with continued or worsening back pain. Loss of ambulatory status since then. History of chronic neurogenic claudication from lumbar stenosis. EXAM: CT ABDOMEN AND PELVIS WITH CONTRAST TECHNIQUE: Multidetector CT imaging of the abdomen and pelvis was performed using the standard protocol following bolus administration of intravenous contrast. RADIATION DOSE REDUCTION: This exam was performed according to the departmental dose-optimization program which includes automated exposure control, adjustment of the mA and/or kV according to patient size and/or use of iterative reconstruction technique. CONTRAST:  OMNIPAQUE IOHEXOL 300 MG/ML  SOLN COMPARISON:  CT with IV contrast 01/14/2021. FINDINGS: Lower chest: Chronic scarring changes again noted in the base of the right middle lobe and lingula. Lung bases are  otherwise clear. There is mild cardiomegaly. Hepatobiliary: No focal liver abnormality is seen. No calcified gallstones, gallbladder wall thickening, or biliary dilatation. Pancreas: Diffuse fatty atrophy. No focal abnormality. No abnormality. No splenomegaly. Spleen: No abnormality. Adrenals/Urinary Tract: There is no adrenal mass. There is a 1.2 cm hyperdense cyst in the posterior aspect of the left kidney which today measures 78 Hounsfield units both in the initial postcontrast phase and in the delayed phase, previously measuring 7.7 Hounsfield units. There are scattered bilateral renal cortical scar-like defects, bilateral small renal lengths 8 cm on the right and 8.2 cm on the left with cortical thinning. No adrenal mass is seen. There is no urinary stone or obstruction. Bilateral week excretion of contrast is noted in the delayed phase. Stomach/Bowel: The stomach and unopacified small bowel are unremarkable. The appendix is normal caliber, retrocecal. There is moderate retained stool ascending and transverse colon. Left-sided diverticula. No findings of acute diverticulitis or colitis. Possible however that the patient could have Proctitis or rectal infiltrating disease, with posterior thickening in the distal rectal wall noted and trace stranding which could be inflammatory or congestive, minimal presacral ascites also seen in the area. Vascular/Lymphatic: Aortic atherosclerosis. No enlarged abdominal or pelvic lymph nodes. Reproductive: Status post hysterectomy. No adnexal masses. Other: As above there is minimal presacral ascites. There is a small umbilical fat hernia with no incarcerated hernia. There is no free hemorrhage, abscess or free air. Musculoskeletal: No acute lumbar compression fracture is evident. Osteopenia and degenerative change of the lumbar spine are again noted. There is acquired spinal stenosis at L3-4 and L4-5, severe and unchanged. Variable foraminal stenosis multiple lumbar levels  greatest at L3-4, L4-5 and L5-S1. There is bilateral hip DJD with an old right hip nailing. Chronic fracture deformities of the right pelvic rami. IMPRESSION: 1. No acute trauma related findings in the abdomen or pelvis. 2. Constipation and diverticulosis. 3. Posterior rectal wall thickening with trace stranding and minimal presacral ascites. Findings could be due to proctitis or rectal  infiltrating disease. Further evaluation recommended. 4. 1.2 cm hyperdense cyst in the posterior left kidney, was previously much lower in density. 5. Aortic atherosclerosis. 6. Cardiomegaly. 7. Osteopenia and degenerative change with acquired spinal stenosis at L3-4 and L4-5, and multilevel foraminal stenosis. 8. Umbilical fat hernia. Aortic Atherosclerosis (ICD10-I70.0). Electronically Signed: By: Almira Bar M.D. On: 02/01/2022 00:24   CT L-SPINE NO CHARGE  Result Date: 02/01/2022 CLINICAL DATA:  Back pain, frequent falls, and recent loss of ambulation. EXAM: CT LUMBAR SPINE WITHOUT CONTRAST TECHNIQUE: Multidetector CT imaging of the lumbar spine was performed without intravenous contrast administration. Multiplanar CT image reconstructions were also generated. RADIATION DOSE REDUCTION: This exam was performed according to the departmental dose-optimization program which includes automated exposure control, adjustment of the mA and/or kV according to patient size and/or use of iterative reconstruction technique. COMPARISON:  MRI lumbar spine from 12/16/2019 and 04/29/2021 FINDINGS: Segmentation: 5 lumbar type vertebrae. Alignment: Grade 1 degenerative anterolisthesis again noted at L3-4 and L4-5, unchanged. No other alignment abnormality. Vertebrae: Osteopenia. There is a nondisplaced transverse fracture of the left L5 transverse process. No other lumbar fracture or destructive bone lesion is seen. There is lumbar spondylosis. A prominent upper plate Schmorl's node is again shown at L2, additional Schmorl's node formation  opposite L3-4, L4-5, and L5-S1. Paraspinal and other soft tissues: Partial fatty atrophy in the intrinsic back musculature similar to prior study. There is aortic atherosclerosis without AAA. There is no paraspinal hematoma. Disc levels: T10-11: There are anterior osteophytes with attempted bridging. Mild disc space loss. No herniation or stenosis. T11-12: No herniation or stenosis. T12-L1: No herniation or stenosis. L1-2: There is a small calcified right paracentral disc extrusion impressing on the right ventral thecal sac. Similar mild disc space loss. Mild facet joint spurring and mild foraminal stenosis are again shown. There is mild spinal stenosis due to ligamentous and facet hypertrophy and the disc extrusion. Mild degenerative foraminal stenosis. L2-3: There is preservation of the normal disc height. Dorsal ligamentous and facet hypertrophy are again noted and a posterior disc bulge, causing moderate acquired spinal canal stenosis. There is only mild foraminal stenosis. L3-4: There is mild disc space loss. Circumferential disc osteophyte complex. This and advanced facet hypertrophy with ligamentous thickening combine to cause severe acquired spinal stenosis, unchanged. There is moderate right and mild to moderate left foraminal stenosis. L4-5: This disc remains normal in height. There are small endplate spurs. Due to a dorsal annular bulge and advanced facet hypertrophy with ligamentous thickening, there is severe acquired spinal canal stenosis and effacement of the thecal sac continuing into the subarticular zones. There is moderate right-greater-than-left foraminal stenosis. L5-S1: There is mild disc space loss. There is a mild nonstenosing posterior disc bulge. Moderate facet hypertrophy is seen with severe right, mild-to-moderate left foraminal stenosis. Other: Patent SI joints with mild spurring and vacuum phenomenon. Noted are nondisplaced acute insufficiency fractures of the anterior right and left  aspects of the sacral ala at S2, extending ventrally across the S2 body with inward cortical buckling anteriorly. IMPRESSION: 1. Acute nondisplaced fracture of the left L5 transverse process. No lumbar compression fracture is seen. 2. Acute nondisplaced insufficiency fractures of the anterior right and left aspects of the sacral ala at S2, extending ventrally across the S2 body with inward cortical buckling. 3. Osteopenia and degenerative changes. 4. Severe acquired spinal canal stenosis at L3-4 and L4-5, moderate at L2-3. 5. Multilevel foraminal stenosis greatest on the right at L5-S1. 6. Aortic atherosclerosis. Aortic Atherosclerosis (ICD10-I70.0). Electronically  Signed   By: Almira Bar M.D.   On: 02/01/2022 00:50   DG Chest Portable 1 View  Result Date: 01/31/2022 CLINICAL DATA:  Fall and weakness EXAM: PORTABLE CHEST 1 VIEW COMPARISON:  Chest x-ray 11/11/2019 FINDINGS: Heart is mildly enlarged. There central pulmonary vascular congestion. There is no focal lung infiltrate, pleural effusion or pneumothorax. No acute fractures are seen. IMPRESSION: No active disease. Electronically Signed   By: Darliss Cheney M.D.   On: 01/31/2022 23:09     Data Reviewed: Relevant notes from primary care and specialist visits, past discharge summaries as available in EHR, including Care Everywhere. Prior diagnostic testing as pertinent to current admission diagnoses Updated medications and problem lists for reconciliation ED course, including vitals, labs, imaging, treatment and response to treatment Triage notes, nursing and pharmacy notes and ED provider's notes Notable results as noted in HPI   Assessment and Plan: Sacral insufficiency fracture L5 transverse process fracture secondary to mechanical fall Intractable back pain with ambulatory dysfunction Chronic back pain on chronic opiates Chronic physical deconditioning Pain control PT and TOC consult Patient would likely need placement  Paroxysmal  atrial fibrillation (HCC) Continue metoprolol and aspirin Not on systemic anticoagulation as paroxysmal A-fib was in the setting of acute infection  Coronary artery disease involving native coronary artery of native heart without angina pectoris Continue aspirin, rosuvastatin and metoprolol  GAD (generalized anxiety disorder) Continue home duloxetine, hydroxyzine and doxepin  Seronegative rheumatoid arthritis (HCC) On methotrexate and Remicade and Plaquenil  Type 2 diabetes mellitus (HCC) Sliding scale insulin coverage  Congestive heart failure with left ventricular systolic dysfunction (HCC) Continue Lasix and metoprolol  Hypothyroidism Continue levothyroxine    DVT prophylaxis: Lovenox  Consults: none  Advance Care Planning:   Code Status: Prior   Family Communication: none  Disposition Plan: Back to previous home environment  Severity of Illness: The appropriate patient status for this patient is OBSERVATION. Observation status is judged to be reasonable and necessary in order to provide the required intensity of service to ensure the patient's safety. The patient's presenting symptoms, physical exam findings, and initial radiographic and laboratory data in the context of their medical condition is felt to place them at decreased risk for further clinical deterioration. Furthermore, it is anticipated that the patient will be medically stable for discharge from the hospital within 2 midnights of admission.   Author: Andris Baumann, MD 02/01/2022 3:05 AM  For on call review [email or web address].

## 2022-02-01 NOTE — Progress Notes (Signed)
Spoke with daughter Kathlee Nations on phone. Per Kathlee Nations, she is concerned regarding patient's worsening mobility and wants to speak with both TOC and MD regarding SNF/Rehab options at discharge.  RN notified Dr. Manuella Ghazi.    Fuller Mandril, RN

## 2022-02-01 NOTE — Assessment & Plan Note (Signed)
Continue metoprolol 

## 2022-02-01 NOTE — Assessment & Plan Note (Addendum)
L5 transverse process fracture secondary to mechanical fall Intractable back pain with ambulatory dysfunction Chronic back pain on chronic opiates Chronic physical deconditioning Pain control PT and TOC consult Patient would likely need placement

## 2022-02-01 NOTE — ED Notes (Signed)
Daughter requests SW consult for possible rehab upon discharge, hospitalist aware

## 2022-02-02 ENCOUNTER — Inpatient Hospital Stay (HOSPITAL_COMMUNITY): Payer: Medicare Other | Admitting: Certified Registered Nurse Anesthetist

## 2022-02-02 ENCOUNTER — Inpatient Hospital Stay (HOSPITAL_COMMUNITY): Payer: Medicare Other

## 2022-02-02 ENCOUNTER — Encounter (HOSPITAL_COMMUNITY): Payer: Self-pay | Admitting: Internal Medicine

## 2022-02-02 ENCOUNTER — Ambulatory Visit: Payer: Medicare Other | Admitting: Psychiatry

## 2022-02-02 ENCOUNTER — Other Ambulatory Visit: Payer: Self-pay

## 2022-02-02 ENCOUNTER — Encounter (HOSPITAL_COMMUNITY)
Admission: RE | Disposition: A | Discharge status: Skilled Nursing Facility | Payer: Self-pay | Source: Ambulatory Visit | Attending: Internal Medicine

## 2022-02-02 ENCOUNTER — Inpatient Hospital Stay (HOSPITAL_COMMUNITY)
Admission: RE | Admit: 2022-02-02 | Discharge: 2022-02-07 | DRG: 516 | Disposition: A | Discharge status: Skilled Nursing Facility | Payer: Medicare Other | Source: Ambulatory Visit | Attending: Internal Medicine | Admitting: Internal Medicine

## 2022-02-02 DIAGNOSIS — F32A Depression, unspecified: Secondary | ICD-10-CM | POA: Diagnosis present

## 2022-02-02 DIAGNOSIS — W1830XA Fall on same level, unspecified, initial encounter: Secondary | ICD-10-CM | POA: Diagnosis not present

## 2022-02-02 DIAGNOSIS — E559 Vitamin D deficiency, unspecified: Secondary | ICD-10-CM | POA: Diagnosis present

## 2022-02-02 DIAGNOSIS — Z8261 Family history of arthritis: Secondary | ICD-10-CM

## 2022-02-02 DIAGNOSIS — E1140 Type 2 diabetes mellitus with diabetic neuropathy, unspecified: Secondary | ICD-10-CM | POA: Diagnosis present

## 2022-02-02 DIAGNOSIS — Z823 Family history of stroke: Secondary | ICD-10-CM

## 2022-02-02 DIAGNOSIS — Z79899 Other long term (current) drug therapy: Secondary | ICD-10-CM

## 2022-02-02 DIAGNOSIS — E1122 Type 2 diabetes mellitus with diabetic chronic kidney disease: Secondary | ICD-10-CM

## 2022-02-02 DIAGNOSIS — Z888 Allergy status to other drugs, medicaments and biological substances status: Secondary | ICD-10-CM | POA: Diagnosis not present

## 2022-02-02 DIAGNOSIS — I2510 Atherosclerotic heart disease of native coronary artery without angina pectoris: Secondary | ICD-10-CM | POA: Diagnosis present

## 2022-02-02 DIAGNOSIS — M84353A Stress fracture, unspecified femur, initial encounter for fracture: Secondary | ICD-10-CM | POA: Diagnosis not present

## 2022-02-02 DIAGNOSIS — E039 Hypothyroidism, unspecified: Secondary | ICD-10-CM | POA: Diagnosis present

## 2022-02-02 DIAGNOSIS — M25561 Pain in right knee: Secondary | ICD-10-CM | POA: Diagnosis not present

## 2022-02-02 DIAGNOSIS — Z79890 Hormone replacement therapy (postmenopausal): Secondary | ICD-10-CM

## 2022-02-02 DIAGNOSIS — G4730 Sleep apnea, unspecified: Secondary | ICD-10-CM | POA: Diagnosis present

## 2022-02-02 DIAGNOSIS — D62 Acute posthemorrhagic anemia: Secondary | ICD-10-CM | POA: Diagnosis not present

## 2022-02-02 DIAGNOSIS — S72001A Fracture of unspecified part of neck of right femur, initial encounter for closed fracture: Principal | ICD-10-CM | POA: Diagnosis present

## 2022-02-02 DIAGNOSIS — M549 Dorsalgia, unspecified: Secondary | ICD-10-CM | POA: Diagnosis not present

## 2022-02-02 DIAGNOSIS — S329XXA Fracture of unspecified parts of lumbosacral spine and pelvis, initial encounter for closed fracture: Secondary | ICD-10-CM | POA: Diagnosis not present

## 2022-02-02 DIAGNOSIS — M0600 Rheumatoid arthritis without rheumatoid factor, unspecified site: Secondary | ICD-10-CM | POA: Diagnosis not present

## 2022-02-02 DIAGNOSIS — M48061 Spinal stenosis, lumbar region without neurogenic claudication: Secondary | ICD-10-CM | POA: Diagnosis not present

## 2022-02-02 DIAGNOSIS — K5900 Constipation, unspecified: Secondary | ICD-10-CM | POA: Diagnosis not present

## 2022-02-02 DIAGNOSIS — N189 Chronic kidney disease, unspecified: Secondary | ICD-10-CM

## 2022-02-02 DIAGNOSIS — D84821 Immunodeficiency due to drugs: Secondary | ICD-10-CM | POA: Diagnosis not present

## 2022-02-02 DIAGNOSIS — G894 Chronic pain syndrome: Secondary | ICD-10-CM

## 2022-02-02 DIAGNOSIS — M2428 Disorder of ligament, vertebrae: Secondary | ICD-10-CM

## 2022-02-02 DIAGNOSIS — G8929 Other chronic pain: Secondary | ICD-10-CM | POA: Diagnosis not present

## 2022-02-02 DIAGNOSIS — M5137 Other intervertebral disc degeneration, lumbosacral region: Secondary | ICD-10-CM

## 2022-02-02 DIAGNOSIS — F411 Generalized anxiety disorder: Secondary | ICD-10-CM | POA: Diagnosis not present

## 2022-02-02 DIAGNOSIS — R296 Repeated falls: Secondary | ICD-10-CM | POA: Diagnosis present

## 2022-02-02 DIAGNOSIS — Z880 Allergy status to penicillin: Secondary | ICD-10-CM | POA: Diagnosis not present

## 2022-02-02 DIAGNOSIS — M159 Polyosteoarthritis, unspecified: Secondary | ICD-10-CM

## 2022-02-02 DIAGNOSIS — I129 Hypertensive chronic kidney disease with stage 1 through stage 4 chronic kidney disease, or unspecified chronic kidney disease: Secondary | ICD-10-CM | POA: Diagnosis not present

## 2022-02-02 DIAGNOSIS — Z7982 Long term (current) use of aspirin: Secondary | ICD-10-CM

## 2022-02-02 DIAGNOSIS — Z96652 Presence of left artificial knee joint: Secondary | ICD-10-CM | POA: Diagnosis present

## 2022-02-02 DIAGNOSIS — Z79891 Long term (current) use of opiate analgesic: Secondary | ICD-10-CM

## 2022-02-02 DIAGNOSIS — M8448XA Pathological fracture, other site, initial encounter for fracture: Secondary | ICD-10-CM | POA: Diagnosis present

## 2022-02-02 DIAGNOSIS — Z8249 Family history of ischemic heart disease and other diseases of the circulatory system: Secondary | ICD-10-CM

## 2022-02-02 DIAGNOSIS — M84651A Pathological fracture in other disease, right femur, initial encounter for fracture: Secondary | ICD-10-CM | POA: Diagnosis present

## 2022-02-02 DIAGNOSIS — M4316 Spondylolisthesis, lumbar region: Secondary | ICD-10-CM

## 2022-02-02 DIAGNOSIS — M48062 Spinal stenosis, lumbar region with neurogenic claudication: Secondary | ICD-10-CM

## 2022-02-02 DIAGNOSIS — I110 Hypertensive heart disease with heart failure: Secondary | ICD-10-CM | POA: Diagnosis not present

## 2022-02-02 DIAGNOSIS — I480 Paroxysmal atrial fibrillation: Secondary | ICD-10-CM | POA: Diagnosis not present

## 2022-02-02 DIAGNOSIS — I5022 Chronic systolic (congestive) heart failure: Secondary | ICD-10-CM | POA: Diagnosis not present

## 2022-02-02 DIAGNOSIS — M47816 Spondylosis without myelopathy or radiculopathy, lumbar region: Secondary | ICD-10-CM

## 2022-02-02 DIAGNOSIS — Z818 Family history of other mental and behavioral disorders: Secondary | ICD-10-CM

## 2022-02-02 DIAGNOSIS — Z811 Family history of alcohol abuse and dependence: Secondary | ICD-10-CM

## 2022-02-02 HISTORY — PX: ORIF PELVIC FRACTURE WITH PERCUTANEOUS SCREWS: SHX6800

## 2022-02-02 HISTORY — DX: Fracture of unspecified part of neck of right femur, initial encounter for closed fracture: S72.001A

## 2022-02-02 LAB — CBC
HCT: 37.5 % (ref 36.0–46.0)
HCT: 35.8 % — ABNORMAL LOW (ref 36.0–46.0)
Hemoglobin: 11.9 g/dL — ABNORMAL LOW (ref 12.0–15.0)
Hemoglobin: 11.6 g/dL — ABNORMAL LOW (ref 12.0–15.0)
MCH: 28.7 pg (ref 26.0–34.0)
MCH: 29.3 pg (ref 26.0–34.0)
MCHC: 31.7 g/dL (ref 30.0–36.0)
MCHC: 32.4 g/dL (ref 30.0–36.0)
MCV: 90.6 fL (ref 80.0–100.0)
MCV: 90.4 fL (ref 80.0–100.0)
Platelets: 364 10*3/uL (ref 150–400)
Platelets: 355 10*3/uL (ref 150–400)
RBC: 4.14 MIL/uL (ref 3.87–5.11)
RBC: 3.96 MIL/uL (ref 3.87–5.11)
RDW: 13.4 % (ref 11.5–15.5)
RDW: 13.3 % (ref 11.5–15.5)
WBC: 8.7 10*3/uL (ref 4.0–10.5)
WBC: 8.6 10*3/uL (ref 4.0–10.5)
nRBC: 0 % (ref 0.0–0.2)
nRBC: 0 % (ref 0.0–0.2)

## 2022-02-02 LAB — HEMOGLOBIN A1C
Hgb A1c MFr Bld: 6.8 % — ABNORMAL HIGH (ref 4.8–5.6)
Mean Plasma Glucose: 148.46 mg/dL

## 2022-02-02 LAB — URINALYSIS, ROUTINE W REFLEX MICROSCOPIC
Bilirubin Urine: NEGATIVE
Glucose, UA: NEGATIVE mg/dL
Hgb urine dipstick: NEGATIVE
Ketones, ur: NEGATIVE mg/dL
Nitrite: NEGATIVE
Protein, ur: 30 mg/dL — AB
Specific Gravity, Urine: 1.035 — ABNORMAL HIGH (ref 1.005–1.030)
WBC, UA: >50 WBC/hpf — ABNORMAL HIGH (ref 0–5)
pH: 6 (ref 5.0–8.0)

## 2022-02-02 LAB — CREATININE, SERUM
Creatinine, Ser: 1.09 mg/dL — ABNORMAL HIGH (ref 0.44–1.00)
GFR, Estimated: 52 mL/min — ABNORMAL LOW (ref >60)

## 2022-02-02 LAB — GLUCOSE, CAPILLARY
Glucose-Capillary: 137 mg/dL — ABNORMAL HIGH (ref 70–99)
Glucose-Capillary: 152 mg/dL — ABNORMAL HIGH (ref 70–99)
Glucose-Capillary: 190 mg/dL — ABNORMAL HIGH (ref 70–99)
Glucose-Capillary: 281 mg/dL — ABNORMAL HIGH (ref 70–99)

## 2022-02-02 LAB — BASIC METABOLIC PANEL
Anion gap: 13 (ref 5–15)
BUN: 27 mg/dL — ABNORMAL HIGH (ref 8–23)
CO2: 30 mmol/L (ref 22–32)
Calcium: 8.6 mg/dL — ABNORMAL LOW (ref 8.9–10.3)
Chloride: 94 mmol/L — ABNORMAL LOW (ref 98–111)
Creatinine, Ser: 1.09 mg/dL — ABNORMAL HIGH (ref 0.44–1.00)
GFR, Estimated: 52 mL/min — ABNORMAL LOW (ref >60)
Glucose, Bld: 149 mg/dL — ABNORMAL HIGH (ref 70–99)
Potassium: 3.5 mmol/L (ref 3.5–5.1)
Sodium: 137 mmol/L (ref 135–145)

## 2022-02-02 LAB — SURGICAL PCR SCREEN
MRSA, PCR: NEGATIVE
Staphylococcus aureus: POSITIVE — AB

## 2022-02-02 SURGERY — CLOSED REDUCTION, PELVIS, WITH PERCUTANEOUS FIXATION
Anesthesia: General | Site: Pelvis | Laterality: Bilateral

## 2022-02-02 MED ORDER — DEXAMETHASONE SODIUM PHOSPHATE 10 MG/ML IJ SOLN
INTRAMUSCULAR | Status: DC | PRN
  Administered 2022-02-02: 5 mg via INTRAVENOUS

## 2022-02-02 MED ORDER — CHLORHEXIDINE GLUCONATE CLOTH 2 % EX PADS
6.0000 | MEDICATED_PAD | Freq: Every day | CUTANEOUS | Status: AC
  Administered 2022-02-03 – 2022-02-07 (×5): 6 via TOPICAL

## 2022-02-02 MED ORDER — 0.9 % SODIUM CHLORIDE (POUR BTL) OPTIME
TOPICAL | Status: DC | PRN
  Administered 2022-02-02: 1000 mL

## 2022-02-02 MED ORDER — LACTATED RINGERS IV SOLN: INTRAVENOUS | Status: DC

## 2022-02-02 MED ORDER — METOCLOPRAMIDE HCL 5 MG PO TABS: 5.0000 mg | ORAL_TABLET | Freq: Three times a day (TID) | ORAL | Status: DC | PRN

## 2022-02-02 MED ORDER — ONDANSETRON HCL 4 MG/2ML IJ SOLN
INTRAMUSCULAR | Status: DC | PRN
  Administered 2022-02-02: 4 mg via INTRAVENOUS

## 2022-02-02 MED ORDER — SUGAMMADEX SODIUM 200 MG/2ML IV SOLN
INTRAVENOUS | Status: DC | PRN
  Administered 2022-02-02: 100 mg via INTRAVENOUS
  Administered 2022-02-02: 200 mg via INTRAVENOUS

## 2022-02-02 MED ORDER — CHLORHEXIDINE GLUCONATE 0.12 % MT SOLN
OROMUCOSAL | Status: AC
  Administered 2022-02-02: 15 mL
  Filled 2022-02-02: qty 15

## 2022-02-02 MED ORDER — ROSUVASTATIN CALCIUM 5 MG PO TABS
5.0000 mg | ORAL_TABLET | ORAL | Status: DC
  Administered 2022-02-04 – 2022-02-06 (×2): 5 mg via ORAL
  Filled 2022-02-02 (×3): qty 1

## 2022-02-02 MED ORDER — CHLORHEXIDINE GLUCONATE 4 % EX LIQD: 60.0000 mL | Freq: Once | CUTANEOUS | Status: DC

## 2022-02-02 MED ORDER — MUPIROCIN 2 % EX OINT
1.0000 | TOPICAL_OINTMENT | Freq: Two times a day (BID) | CUTANEOUS | Status: DC
  Administered 2022-02-02 – 2022-02-07 (×8): 1 via NASAL
  Filled 2022-02-02 (×3): qty 22

## 2022-02-02 MED ORDER — HYDROMORPHONE HCL 1 MG/ML IJ SOLN
0.5000 mg | INTRAMUSCULAR | Status: DC | PRN
  Administered 2022-02-04 – 2022-02-06 (×5): 1 mg via INTRAVENOUS
  Filled 2022-02-02 (×5): qty 1

## 2022-02-02 MED ORDER — OXYCODONE HCL 5 MG PO TABS
5.0000 mg | ORAL_TABLET | Freq: Four times a day (QID) | ORAL | Status: DC | PRN
  Administered 2022-02-02 – 2022-02-03 (×2): 5 mg via ORAL
  Filled 2022-02-02 (×2): qty 1

## 2022-02-02 MED ORDER — ACETAMINOPHEN 325 MG PO TABS
650.0000 mg | ORAL_TABLET | Freq: Four times a day (QID) | ORAL | Status: DC
  Administered 2022-02-02 – 2022-02-03 (×2): 650 mg via ORAL
  Filled 2022-02-02 (×3): qty 2

## 2022-02-02 MED ORDER — ASPIRIN 81 MG PO TBEC
81.0000 mg | DELAYED_RELEASE_TABLET | Freq: Every day | ORAL | Status: DC
  Administered 2022-02-02 – 2022-02-07 (×6): 81 mg via ORAL
  Filled 2022-02-02 (×6): qty 1

## 2022-02-02 MED ORDER — AMISULPRIDE (ANTIEMETIC) 5 MG/2ML IV SOLN: 10.0000 mg | Freq: Once | INTRAVENOUS | Status: DC | PRN

## 2022-02-02 MED ORDER — ORAL CARE MOUTH RINSE
15.0000 mL | Freq: Once | OROMUCOSAL | Status: AC
  Administered 2022-02-02: 15 mL via OROMUCOSAL

## 2022-02-02 MED ORDER — LEVOTHYROXINE SODIUM 50 MCG PO TABS
250.0000 ug | ORAL_TABLET | Freq: Every day | ORAL | Status: DC
  Administered 2022-02-02 – 2022-02-07 (×6): 250 ug via ORAL
  Filled 2022-02-02 (×6): qty 1

## 2022-02-02 MED ORDER — CHLORHEXIDINE GLUCONATE 0.12 % MT SOLN: 15.0000 mL | Freq: Once | OROMUCOSAL | Status: AC

## 2022-02-02 MED ORDER — OXYCODONE HCL 5 MG PO TABS: 5.0000 mg | ORAL_TABLET | Freq: Four times a day (QID) | ORAL | Status: DC | PRN

## 2022-02-02 MED ORDER — ENOXAPARIN SODIUM 40 MG/0.4ML IJ SOSY
40.0000 mg | PREFILLED_SYRINGE | INTRAMUSCULAR | Status: DC
  Administered 2022-02-03 – 2022-02-04 (×2): 40 mg via SUBCUTANEOUS
  Filled 2022-02-02 (×3): qty 0.4

## 2022-02-02 MED ORDER — DOCUSATE SODIUM 100 MG PO CAPS
100.0000 mg | ORAL_CAPSULE | ORAL | Status: DC
  Administered 2022-02-02 – 2022-02-07 (×3): 100 mg via ORAL
  Filled 2022-02-02 (×4): qty 1

## 2022-02-02 MED ORDER — PROPOFOL 10 MG/ML IV BOLUS
INTRAVENOUS | Status: AC
  Filled 2022-02-02: qty 20

## 2022-02-02 MED ORDER — DULOXETINE HCL 60 MG PO CPEP
60.0000 mg | ORAL_CAPSULE | Freq: Every day | ORAL | Status: DC
  Administered 2022-02-02 – 2022-02-07 (×6): 60 mg via ORAL
  Filled 2022-02-02 (×6): qty 1

## 2022-02-02 MED ORDER — POLYETHYLENE GLYCOL 3350 17 G PO PACK: 17.0000 g | PACK | Freq: Every day | ORAL | Status: DC | PRN

## 2022-02-02 MED ORDER — METOCLOPRAMIDE HCL 5 MG/ML IJ SOLN: 5.0000 mg | Freq: Three times a day (TID) | INTRAMUSCULAR | Status: DC | PRN

## 2022-02-02 MED ORDER — FENTANYL CITRATE (PF) 250 MCG/5ML IJ SOLN
INTRAMUSCULAR | Status: AC
  Filled 2022-02-02: qty 5

## 2022-02-02 MED ORDER — HYDROXYCHLOROQUINE SULFATE 200 MG PO TABS
200.0000 mg | ORAL_TABLET | Freq: Every day | ORAL | Status: DC
  Administered 2022-02-02 – 2022-02-07 (×6): 200 mg via ORAL
  Filled 2022-02-02 (×6): qty 1

## 2022-02-02 MED ORDER — PHENOL 1.4 % MT LIQD: 1.0000 | OROMUCOSAL | Status: DC | PRN

## 2022-02-02 MED ORDER — ROCURONIUM BROMIDE 10 MG/ML (PF) SYRINGE
PREFILLED_SYRINGE | INTRAVENOUS | Status: DC | PRN
  Administered 2022-02-02: 50 mg via INTRAVENOUS

## 2022-02-02 MED ORDER — POVIDONE-IODINE 10 % EX SWAB
2.0000 | Freq: Once | CUTANEOUS | Status: AC
  Administered 2022-02-02: 2 via TOPICAL

## 2022-02-02 MED ORDER — PROPOFOL 10 MG/ML IV BOLUS
INTRAVENOUS | Status: DC | PRN
  Administered 2022-02-02: 130 mg via INTRAVENOUS

## 2022-02-02 MED ORDER — INSULIN ASPART 100 UNIT/ML IJ SOLN
0.0000 [IU] | INTRAMUSCULAR | Status: DC
  Administered 2022-02-02: 2 [IU] via SUBCUTANEOUS
  Administered 2022-02-02: 5 [IU] via SUBCUTANEOUS
  Administered 2022-02-02: 1 [IU] via SUBCUTANEOUS
  Administered 2022-02-03: 2 [IU] via SUBCUTANEOUS
  Administered 2022-02-03 (×2): 1 [IU] via SUBCUTANEOUS
  Administered 2022-02-03: 3 [IU] via SUBCUTANEOUS
  Administered 2022-02-03: 2 [IU] via SUBCUTANEOUS
  Administered 2022-02-03: 3 [IU] via SUBCUTANEOUS
  Administered 2022-02-04 (×2): 1 [IU] via SUBCUTANEOUS
  Administered 2022-02-04: 2 [IU] via SUBCUTANEOUS
  Administered 2022-02-04 (×2): 1 [IU] via SUBCUTANEOUS
  Administered 2022-02-04 – 2022-02-05 (×3): 2 [IU] via SUBCUTANEOUS
  Administered 2022-02-05 (×3): 1 [IU] via SUBCUTANEOUS
  Administered 2022-02-06: 2 [IU] via SUBCUTANEOUS
  Administered 2022-02-06: 1 [IU] via SUBCUTANEOUS
  Administered 2022-02-06 (×2): 2 [IU] via SUBCUTANEOUS
  Administered 2022-02-06: 1 [IU] via SUBCUTANEOUS

## 2022-02-02 MED ORDER — FENTANYL CITRATE (PF) 250 MCG/5ML IJ SOLN
INTRAMUSCULAR | Status: DC | PRN
  Administered 2022-02-02 (×2): 50 ug via INTRAVENOUS
  Administered 2022-02-02: 100 ug via INTRAVENOUS

## 2022-02-02 MED ORDER — PHENYLEPHRINE HCL-NACL 20-0.9 MG/250ML-% IV SOLN
INTRAVENOUS | Status: DC | PRN
  Administered 2022-02-02: 30 ug/min via INTRAVENOUS

## 2022-02-02 MED ORDER — ONDANSETRON HCL 4 MG/2ML IJ SOLN: 4.0000 mg | Freq: Four times a day (QID) | INTRAMUSCULAR | Status: DC | PRN

## 2022-02-02 MED ORDER — CEFAZOLIN SODIUM-DEXTROSE 2-4 GM/100ML-% IV SOLN
2.0000 g | INTRAVENOUS | Status: AC
  Administered 2022-02-02: 2 g via INTRAVENOUS
  Filled 2022-02-02: qty 100

## 2022-02-02 MED ORDER — METOPROLOL SUCCINATE ER 50 MG PO TB24
100.0000 mg | ORAL_TABLET | Freq: Every day | ORAL | Status: DC
  Administered 2022-02-02 – 2022-02-07 (×6): 100 mg via ORAL
  Filled 2022-02-02 (×6): qty 2

## 2022-02-02 MED ORDER — ONDANSETRON HCL 4 MG PO TABS: 4.0000 mg | ORAL_TABLET | Freq: Four times a day (QID) | ORAL | Status: DC | PRN

## 2022-02-02 MED ORDER — LIDOCAINE 2% (20 MG/ML) 5 ML SYRINGE
INTRAMUSCULAR | Status: DC | PRN
  Administered 2022-02-02: 60 mg via INTRAVENOUS

## 2022-02-02 MED ORDER — CEFAZOLIN SODIUM-DEXTROSE 2-4 GM/100ML-% IV SOLN
2.0000 g | Freq: Three times a day (TID) | INTRAVENOUS | Status: AC
  Administered 2022-02-02 – 2022-02-03 (×3): 2 g via INTRAVENOUS
  Filled 2022-02-02 (×3): qty 100

## 2022-02-02 MED ORDER — ALBUTEROL SULFATE (2.5 MG/3ML) 0.083% IN NEBU: 3.0000 mL | INHALATION_SOLUTION | Freq: Four times a day (QID) | RESPIRATORY_TRACT | Status: DC | PRN

## 2022-02-02 MED ORDER — FENTANYL CITRATE (PF) 100 MCG/2ML IJ SOLN: 25.0000 ug | INTRAMUSCULAR | Status: DC | PRN

## 2022-02-02 SURGICAL SUPPLY — 43 items
BAG COUNTER SPONGE SURGICOUNT (BAG) ×1 IMPLANT
BLADE CLIPPER SURG (BLADE) IMPLANT
BLADE SURG 11 STRL SS (BLADE) ×1 IMPLANT
CHLORAPREP W/TINT 26 (MISCELLANEOUS) ×1 IMPLANT
DERMABOND ADVANCED .7 DNX12 (GAUZE/BANDAGES/DRESSINGS) IMPLANT
DRAPE C-ARM 42X72 X-RAY (DRAPES) ×1 IMPLANT
DRAPE C-ARMOR (DRAPES) ×1 IMPLANT
DRAPE HALF SHEET 40X57 (DRAPES) ×1 IMPLANT
DRAPE INCISE IOBAN 66X45 STRL (DRAPES) ×1 IMPLANT
DRAPE SURG 17X23 STRL (DRAPES) ×6 IMPLANT
DRAPE U-SHAPE 47X51 STRL (DRAPES) ×1 IMPLANT
DRESSING MEPILEX FLEX 4X4 (GAUZE/BANDAGES/DRESSINGS) IMPLANT
DRSG MEPILEX BORDER 4X4 (GAUZE/BANDAGES/DRESSINGS) IMPLANT
DRSG MEPILEX BORDER 4X8 (GAUZE/BANDAGES/DRESSINGS) IMPLANT
DRSG MEPILEX FLEX 4X4 (GAUZE/BANDAGES/DRESSINGS) ×1
ELECT REM PT RETURN 9FT ADLT (ELECTROSURGICAL) ×1
ELECTRODE REM PT RTRN 9FT ADLT (ELECTROSURGICAL) ×1 IMPLANT
GLOVE BIO SURGEON STRL SZ 6.5 (GLOVE) ×3 IMPLANT
GLOVE BIO SURGEON STRL SZ7.5 (GLOVE) ×4 IMPLANT
GLOVE BIOGEL PI IND STRL 6.5 (GLOVE) ×1 IMPLANT
GLOVE BIOGEL PI IND STRL 7.5 (GLOVE) ×1 IMPLANT
GOWN STRL REUS W/ TWL LRG LVL3 (GOWN DISPOSABLE) ×2 IMPLANT
GOWN STRL REUS W/TWL LRG LVL3 (GOWN DISPOSABLE) ×2
KIT BASIN OR (CUSTOM PROCEDURE TRAY) ×1 IMPLANT
KIT GUIDEWIRE CURVAFIX (WIRE) IMPLANT
KIT TURNOVER KIT B (KITS) ×1 IMPLANT
MANIFOLD NEPTUNE II (INSTRUMENTS) ×1 IMPLANT
NAIL IM ST CURVAFIX 9.5X140 (Nail) IMPLANT
NS IRRIG 1000ML POUR BTL (IV SOLUTION) ×1 IMPLANT
PACK TOTAL JOINT (CUSTOM PROCEDURE TRAY) ×1 IMPLANT
PACK UNIVERSAL I (CUSTOM PROCEDURE TRAY) ×1 IMPLANT
PAD ARMBOARD 7.5X6 YLW CONV (MISCELLANEOUS) ×2 IMPLANT
PIN GUIDE THRD CURVAFIX 28 (PIN) IMPLANT
SPONGE T-LAP 18X18 ~~LOC~~+RFID (SPONGE) IMPLANT
STAPLER VISISTAT 35W (STAPLE) ×1 IMPLANT
SUCTION FRAZIER HANDLE 10FR (MISCELLANEOUS) ×1
SUCTION TUBE FRAZIER 10FR DISP (MISCELLANEOUS) ×1 IMPLANT
SUT MNCRL AB 3-0 PS2 18 (SUTURE) ×1 IMPLANT
SUT MON AB 2-0 CT1 36 (SUTURE) ×1 IMPLANT
SUT VIC AB 2-0 CT1 27 (SUTURE) ×1
SUT VIC AB 2-0 CT1 TAPERPNT 27 (SUTURE) IMPLANT
TRAY FOLEY MTR SLVR 16FR STAT (SET/KITS/TRAYS/PACK) IMPLANT
WATER STERILE IRR 1000ML POUR (IV SOLUTION) ×1 IMPLANT

## 2022-02-02 NOTE — Interval H&P Note (Signed)
History and Physical Interval Note:  02/02/2022 11:56 AM  Laura [name]  has presented today for surgery, with the diagnosis of Pelvic fracture.  The various methods of treatment have been discussed with the patient and family. After consideration of risks, benefits and other options for treatment, the patient has consented to  Procedure(s): ORIF PELVIC FRACTURE WITH PERCUTANEOUS SCREWS (Bilateral) as a surgical intervention.  The patient's history has been reviewed, patient examined, no change in status, stable for surgery.  I have reviewed the patient's chart and labs.  Questions were answered to the patient's satisfaction.     Lennette Bihari P Callie Bunyard

## 2022-02-02 NOTE — TOC Progression Note (Signed)
Transition of Care [facility]) - Progression Note    Patient Details  Name: Brendalyn Vallely MRN: [id number] Date of Birth: 1945/04/05  [facility] of Care [facility]) CM/SW Contact  Beverly Sessions, RN Phone Number: 02/02/2022, 9:02 AM  Clinical Narrative:    PASRR received [id number] E expires 03/04/22        Expected Discharge Plan and Services                                               Social Determinants of Health (SDOH) Interventions SDOH Screenings   Food Insecurity: No Food Insecurity (02/01/2022)  Housing: Low Risk  (02/01/2022)  Transportation Needs: No Transportation Needs (02/01/2022)  Utilities: Not At Risk (02/01/2022)  Depression (PHQ2-9): Low Risk  (10/21/2021)  Financial Resource Strain: Low Risk  (11/15/2016)  Physical Activity: Inactive (11/15/2016)  Social Connections: Somewhat Isolated (11/15/2016)  Stress: No Stress Concern Present (11/15/2016)  Tobacco Use: Low Risk  (01/31/2022)    Readmission Risk Interventions     No data to display

## 2022-02-02 NOTE — Op Note (Signed)
Orthopaedic Surgery Operative Note (CSN: [id number] ) Date of Surgery: 02/02/2022  Admit Date: 02/02/2022   Diagnoses: Pre-Op Diagnoses: Bilateral sacral insufficiency fractures  Post-Op Diagnosis: Same  Procedures: CPT 27216 x2-Percutaneous fixation of bilateral sacral insufficiency fractures  Surgeons : Primary: Lariya Kinzie, Thomasene Lot, MD  Assistant: Patrecia Pace, PA-C  Location: OR 3   Anesthesia: General   Antibiotics: Ancef 2g preop   Tourniquet time: None   Estimated Blood Loss: 5 mL  Complications:None  Specimens:None   Implants: Implant Name Type Inv. Item Serial No. Manufacturer Lot No. LRB No. Used Action  IM Implant    [employer] [id number]  1 Implanted     Indications for Surgery: 77 year old [name] who had a fall earlier this month she had progressive decline in her ambulatory status and severe pain to where she could not get out of bed.  CT scan showed a confirmed U-type sacral insufficiency fracture with bilateral zone 2 sacral fractures.  Due to the inability to mobilize and the severe pain that she was having a felt that she was indicated for percutaneous fixation of her posterior pelvis.  Risk and benefits were discussed with the patient and her family.  Risks included but not limited to bleeding, infection, malunion, nonunion, hardware failure, hardware irritation, nerve or blood vessel injury, DVT, even the possibility anesthetic complications.  They agreed to proceed with surgery and consent was obtained.  Operative Findings: Percutaneous fixation of bilateral sacral fractures using Curvafix 9.93mm x153mm intraosseous fixation device  Procedure: The patient was identified in the preoperative holding area. Consent was confirmed with the patient and their family and all questions were answered. The operative extremity was marked after confirmation with the patient. she was then brought back to the operating room by our anesthesia colleagues.  She was  placed under general anesthetic and carefully transferred over to radiolucent flattop table.  Sacral bump was used to elevate her pelvis off the table for appropriate positioning of the implant.  Fluoroscopic imaging was then obtained showing the sacral fractures posteriorly.  The pelvis was then draped and prepped in usual sterile fashion.  A timeout was performed to verify the patient, the procedure, and the extremity.  Preoperative antibiotics were dosed.  Using an inlet and outlet views I percutaneously directed a threaded guidewire at the appropriate starting point for a transiliac intraosseous screw device.  I advanced it into the lateral ilium on the right side crossing the SI joint.  I then cut on this guidewire using the 11 blade.  I then used an entry reamer to enter the lateral pelvis crossing the SI joint.  I then used a bent ball-tipped guidewire to advance across the S1 pathway until I reached the far neuroforamen and far SI joint.  I then advanced it across this into the lateral ilium and eventually reached the outer table.  I then measured the length of the guidewire and decided to place 140 mm screw.  I then reamed with a 7 mm reamer and an 8.5 mm reamer.  I exchanged the ball-tipped guidewire to a smooth guidewire using an exchange tube.  I then remove the exchange tube and placed the screw device across the posterior pelvis.  Good fixation was obtained.  I then locked the internal cables of the device and removed the insertion handle.  Final fluoroscopic imaging was obtained.  The incision was copiously irrigated.  2-0 Vicryl and 3-0 Monocryl was used to close the skin.  Dermabond was used to seal  the skin.  Sterile dressings were applied.  The patient was then awoke from anesthesia and taken to the PACU in stable condition.  Post Op Plan/Instructions: The patient be weightbearing as tolerated to bilateral lower extremities.  She may mobilize with physical therapy as needed.  Will recommend  aspirin for DVT prophylaxis upon discharge.  She may receive Lovenox while inpatient.  I was present and performed the entire surgery.  Patrecia Pace, PA-C did assist me throughout the case. An assistant was necessary given the difficulty in approach, maintenance of reduction and ability to instrument the fracture.   Katha Hamming, MD [facility]

## 2022-02-02 NOTE — TOC Progression Note (Signed)
Transition of Care [facility]) - Progression Note    Patient Details  Name: Laura [name] MRN: [id number] Date of Birth: [birth year]  [facility] of [facility] CM/SW Contact  Beverly Sessions, RN Phone Number: 02/02/2022, 8:55 AM  Clinical Narrative:      Clinical uploaded to [facility]      Expected Discharge Plan and Services                                               Social Determinants of Health (SDOH) Interventions SDOH Screenings   Food Insecurity: No Food Insecurity (02/01/2022)  Housing: Low Risk  (02/01/2022)  Transportation Needs: No Transportation Needs (02/01/2022)  Utilities: Not At Risk (02/01/2022)  Depression (PHQ2-9): Low Risk  (10/21/2021)  Financial Resource Strain: Low Risk  (11/15/2016)  Physical Activity: Inactive (11/15/2016)  Social Connections: Somewhat Isolated (11/15/2016)  Stress: No Stress Concern Present (11/15/2016)  Tobacco Use: Low Risk  (01/31/2022)    Readmission Risk Interventions     No data to display

## 2022-02-02 NOTE — Anesthesia Procedure Notes (Signed)
Procedure Name: Intubation Date/Time: 02/02/2022 1:22 PM  Performed by: Darletta Moll, CRNAPre-anesthesia Checklist: Patient identified, Emergency Drugs available, Suction available and Patient being monitored Patient Re-evaluated:Patient Re-evaluated prior to induction Oxygen Delivery Method: Circle system utilized Preoxygenation: Pre-oxygenation with 100% oxygen Induction Type: IV induction Ventilation: Mask ventilation without difficulty Laryngoscope Size: Mac and 3 Grade View: Grade I Tube type: Oral Tube size: 7.0 mm Number of attempts: 1 Airway Equipment and Method: Stylet and Oral airway Placement Confirmation: ETT inserted through vocal cords under direct vision, positive ETCO2 and breath sounds checked- equal and bilateral Secured at: 21 cm Tube secured with: Tape Dental Injury: Teeth and Oropharynx as per pre-operative assessment

## 2022-02-02 NOTE — Anesthesia Preprocedure Evaluation (Signed)
Anesthesia Evaluation  Patient identified by MRN, date of birth, ID band Patient awake    Reviewed: Allergy & Precautions, NPO status , Patient's Chart, lab work & pertinent test results  Airway Mallampati: II  TM Distance: >3 FB Neck ROM: Full    Dental  (+) Dental Advisory Given   Pulmonary asthma , sleep apnea and Continuous Positive Airway Pressure Ventilation    breath sounds clear to auscultation       Cardiovascular hypertension, Pt. on medications and Pt. on home beta blockers + CAD   Rhythm:Regular Rate:Normal     Neuro/Psych Seizures -,   Neuromuscular disease CVA    GI/Hepatic Neg liver ROS,GERD  ,,  Endo/Other  diabetesHypothyroidism    Renal/GU CRFRenal disease     Musculoskeletal  (+) Arthritis ,    Abdominal   Peds  Hematology negative hematology ROS (+)   Anesthesia Other Findings   Reproductive/Obstetrics                             Anesthesia Physical Anesthesia Plan  ASA: 3  Anesthesia Plan: General   Post-op Pain Management: Ofirmev IV (intra-op)* and Toradol IV (intra-op)*   Induction: Intravenous  PONV Risk Score and Plan: 3 and Dexamethasone, Ondansetron, Midazolam and Treatment may vary due to age or medical condition  Airway Management Planned: Oral ETT  Additional Equipment: None  Intra-op Plan:   Post-operative Plan: Extubation in OR  Informed Consent: I have reviewed the patients History and Physical, chart, labs and discussed the procedure including the risks, benefits and alternatives for the proposed anesthesia with the patient or authorized representative who has indicated his/her understanding and acceptance.     Dental advisory given  Plan Discussed with: CRNA  Anesthesia Plan Comments:        Anesthesia Quick Evaluation

## 2022-02-02 NOTE — H&P (View-Only) (Signed)
Reason for Consult:Sacral fxs Referring Physician: Rickey Barbara Time called: 6010 Time at bedside: 0904   Laura [name] is an 77 y.o. female.  HPI: Laura [name] has fallen several times this month, the first time on the 11th. She began to have leg pain and numbness and then back pain as time went on. It's become unbearable and she has extreme pain with turning. She is essentially non-ambulatory at baseline but was able to transfer unassisted prior to these falls. She lives at home with her husband.  Past Medical History:  Diagnosis Date   Allergy    Anxiety    Arthritis, degenerative 10/08/2013   Overview:    a.  Lumbar spine/spinal stenosis/foot drop.   b.  Hands.    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   Memory loss, short term 03/19/2014   Rheumatoid arthritis (HCC)    Sciatica of left side 12/31/2019   Seizure (HCC) 10/07/2014   Sleep apnea    Sleep apnea    Thyroid disease     Past Surgical History:  Procedure Laterality Date   ABDOMINAL HYSTERECTOMY     CESAREAN SECTION     HIP SURGERY Right    REPLACEMENT TOTAL KNEE Left     Family History  Problem Relation Age of Onset   Hypertension Mother    Stroke Mother    Heart attack Father    Alcohol abuse Father    Depression Father    Post-traumatic stress disorder Father    Rheum arthritis Sister    Hypertension Sister    Depression Sister    Breast cancer Neg Hx     Social History:  reports that she has never smoked. She has never used smokeless tobacco. She reports that she does not drink alcohol and does not use drugs.  Allergies:  Allergies  Allergen Reactions   Bupropion Other (See Comments)   Jardiance [Empagliflozin] Other (See Comments)   Penicillins Rash    Other Reaction(s): Not available    Medications: I  have reviewed the patient's current medications.  Results for orders placed or performed during the hospital encounter of 01/31/22 (from the past 48 hour(s))  CBC with Differential     Status: Abnormal   Collection Time: 01/31/22  7:56 PM  Result Value Ref Range   WBC 10.9 (H) 4.0 - 10.5 K/uL   RBC 4.37 3.87 - 5.11 MIL/uL   Hemoglobin 12.5 12.0 - 15.0 g/dL   HCT 93.2 35.5 - 73.2 %   MCV 88.8 80.0 - 100.0 fL   MCH 28.6 26.0 - 34.0 pg   MCHC 32.2 30.0 - 36.0 g/dL   RDW 20.2 54.2 - 70.6 %   Platelets 401 (H) 150 - 400 K/uL   nRBC 0.0 0.0 - 0.2 %   Neutrophils Relative % 69 %   Neutro Abs 7.5 1.7 - 7.7 K/uL   Lymphocytes Relative 20 %   Lymphs Abs 2.1 0.7 - 4.0 K/uL   Monocytes Relative 8 %   Monocytes Absolute 0.8 0.1 - 1.0 K/uL   Eosinophils Relative 2 %   Eosinophils Absolute 0.3 0.0 - 0.5 K/uL   Basophils Relative 1 %   Basophils Absolute 0.1 0.0 - 0.1 K/uL   Immature Granulocytes 0 %   Abs Immature Granulocytes 0.04 0.00 - 0.07 K/uL  Comment: Performed at [facility], [address] [place], [place] [address]  Comprehensive metabolic panel     Status: Abnormal   Collection Time: 01/31/22  7:56 PM  Result Value Ref Range   Sodium 134 (L) 135 - 145 mmol/L   Potassium 3.3 (L) 3.5 - 5.1 mmol/L   Chloride 90 (L) 98 - 111 mmol/L   CO2 30 22 - 32 mmol/L   Glucose, Bld 154 (H) 70 - 99 mg/dL    Comment: Glucose reference range applies only to samples taken after fasting for at least 8 hours.   BUN 18 8 - 23 mg/dL   Creatinine, Ser 4.27 0.44 - 1.00 mg/dL   Calcium 8.6 (L) 8.9 - 10.3 mg/dL   Total Protein 7.4 6.5 - 8.1 g/dL   Albumin 3.5 3.5 - 5.0 g/dL   AST 18 15 - 41 U/L   ALT 10 0 - 44 U/L   Alkaline Phosphatase 74 38 - 126 U/L   Total Bilirubin 0.5 0.3 - 1.2 mg/dL   GFR, Estimated >06 >23 mL/min    Comment: (NOTE) Calculated using the CKD-EPI Creatinine Equation (2021)    Anion gap 14 5 - 15    Comment: Performed at [facility], [address], [place], [place] [address]  Lactic acid, plasma     Status: None   Collection Time: 01/31/22  7:56 PM  Result Value Ref Range   Lactic Acid, Venous 1.4 0.5 - 1.9 mmol/L    Comment: Performed at [facility], [address] [place], [place] [address]  Troponin I (High Sensitivity)     Status: Abnormal   Collection Time: 01/31/22  7:56 PM  Result Value Ref Range   Troponin I (High Sensitivity) 23 (H) <18 ng/L    Comment: (NOTE) Elevated high sensitivity troponin I (hsTnI) values and significant  changes across serial measurements may suggest ACS but many other  chronic and acute conditions are known to elevate hsTnI results.  Refer to the "Links" section for chest pain algorithms and additional  guidance. Performed at [facility], [address] [place], [place] [address]   Resp panel by RT-PCR (RSV, Flu A&B, Covid) Anterior Nasal Swab     Status: None   Collection Time: 01/31/22 10:09 PM   Specimen: Anterior Nasal Swab  Result Value Ref Range   SARS Coronavirus 2 by RT PCR NEGATIVE NEGATIVE    Comment: (NOTE) SARS-CoV-2 target nucleic acids are NOT DETECTED.  The SARS-CoV-2 RNA is generally detectable in upper respiratory specimens during the acute phase of infection. The lowest concentration of SARS-CoV-2 viral copies this assay can detect is 138 copies/mL. A negative result does not preclude SARS-Cov-2 infection and should not be used as the sole basis for treatment or other patient management decisions. A negative result may occur with  improper specimen collection/handling, submission of specimen other than nasopharyngeal swab, presence of viral mutation(s) within the areas targeted by this assay, and inadequate number of viral copies(<138 copies/mL). A negative result must be combined with clinical observations, patient history, and epidemiological information. The expected result is Negative.  Fact Sheet for Patients:   BloggerCourse.com  Fact Sheet for Healthcare Providers:  SeriousBroker.it  This test is no t yet approved or cleared by the [place] FDA and  has been authorized for detection and/or diagnosis of SARS-CoV-2 by FDA under an Emergency Use Authorization (EUA). This EUA will remain  in effect (meaning this test can be used) for the  duration of the COVID-19 declaration under Section 564(b)(1) of the Act, 21 U.S.C.section 360bbb-3(b)(1), unless the authorization is terminated  or revoked sooner.       Influenza A by PCR NEGATIVE NEGATIVE   Influenza B by PCR NEGATIVE NEGATIVE    Comment: (NOTE) The Xpert Xpress SARS-CoV-2/FLU/RSV plus assay is intended as an aid in the diagnosis of influenza from Nasopharyngeal swab specimens and should not be used as a sole basis for treatment. Nasal washings and aspirates are unacceptable for Xpert Xpress SARS-CoV-2/FLU/RSV testing.  Fact Sheet for Patients: BloggerCourse.com  Fact Sheet for Healthcare Providers: SeriousBroker.it  This test is not yet approved or cleared by the [place] FDA and has been authorized for detection and/or diagnosis of SARS-CoV-2 by FDA under an Emergency Use Authorization (EUA). This EUA will remain in effect (meaning this test can be used) for the duration of the COVID-19 declaration under Section 564(b)(1) of the Act, 21 U.S.C. section 360bbb-3(b)(1), unless the authorization is terminated or revoked.     Resp Syncytial Virus by PCR NEGATIVE NEGATIVE    Comment: (NOTE) Fact Sheet for Patients: BloggerCourse.com  Fact Sheet for Healthcare Providers: SeriousBroker.it  This test is not yet approved or cleared by the [place] FDA and has been authorized for detection and/or diagnosis of SARS-CoV-2 by FDA under an Emergency Use Authorization (EUA).  This EUA will remain in effect (meaning this test can be used) for the duration of the COVID-19 declaration under Section 564(b)(1) of the Act, 21 U.S.C. section 360bbb-3(b)(1), unless the authorization is terminated or revoked.  Performed at [facility], [address] [place], [place] [address]   Troponin I (High Sensitivity)     Status: Abnormal   Collection Time: 01/31/22 11:04 PM  Result Value Ref Range   Troponin I (High Sensitivity) 25 (H) <18 ng/L    Comment: (NOTE) Elevated high sensitivity troponin I (hsTnI) values and significant  changes across serial measurements may suggest ACS but many other  chronic and acute conditions are known to elevate hsTnI results.  Refer to the "Links" section for chest pain algorithms and additional  guidance. Performed at [facility], [address] [place], [place] [address]   Urinalysis, Routine w reflex microscopic Urine, Clean Catch     Status: Abnormal   Collection Time: 02/02/22  3:00 AM  Result Value Ref Range   Color, Urine YELLOW (A) YELLOW   APPearance CLOUDY (A) CLEAR   Specific Gravity, Urine 1.035 (H) 1.005 - 1.030   pH 6.0 5.0 - 8.0   Glucose, UA NEGATIVE NEGATIVE mg/dL   Hgb urine dipstick NEGATIVE NEGATIVE   Bilirubin Urine NEGATIVE NEGATIVE   Ketones, ur NEGATIVE NEGATIVE mg/dL   Protein, ur 30 (A) NEGATIVE mg/dL   Nitrite NEGATIVE NEGATIVE   Leukocytes,Ua LARGE (A) NEGATIVE   RBC / HPF 0-5 0 - 5 RBC/hpf   WBC, UA >50 (H) 0 - 5 WBC/hpf   Bacteria, UA RARE (A) NONE SEEN   Squamous Epithelial / HPF 0-5 0 - 5 /HPF    Comment: Performed at [facility], [address] [place], [place] [address]  CBC     Status: Abnormal   Collection Time: 02/02/22  5:12 AM  Result Value Ref Range   WBC 8.7 4.0 - 10.5 K/uL   RBC 4.14 3.87 - 5.11 MIL/uL   Hemoglobin 11.9 (L) 12.0 - 15.0 g/dL   HCT 56.2 56.3 - 89.3 %   MCV 90.6 80.0 - 100.0 fL  MCH 28.7 26.0 - 34.0 pg   MCHC 31.7 30.0 - 36.0 g/dL    RDW 13.4 11.5 - 15.5 %   Platelets 364 150 - 400 K/uL   nRBC 0.0 0.0 - 0.2 %    Comment: Performed at [facility], [address] [place], [place] [address]  Basic metabolic panel     Status: Abnormal   Collection Time: 02/02/22  5:12 AM  Result Value Ref Range   Sodium 137 135 - 145 mmol/L   Potassium 3.5 3.5 - 5.1 mmol/L   Chloride 94 (L) 98 - 111 mmol/L   CO2 30 22 - 32 mmol/L   Glucose, Bld 149 (H) 70 - 99 mg/dL    Comment: Glucose reference range applies only to samples taken after fasting for at least 8 hours.   BUN 27 (H) 8 - 23 mg/dL   Creatinine, Ser 1.09 (H) 0.44 - 1.00 mg/dL   Calcium 8.6 (L) 8.9 - 10.3 mg/dL   GFR, Estimated 52 (L) >60 mL/min    Comment: (NOTE) Calculated using the CKD-EPI Creatinine Equation (2021)    Anion gap 13 5 - 15    Comment: Performed at [facility], [address] [place], [place] [address]    US Venous Img Lower Bilateral  Result Date: 02/01/2022 CLINICAL DATA:  Bilateral leg swelling EXAM: BILATERAL LOWER EXTREMITY VENOUS DOPPLER ULTRASOUND TECHNIQUE: Gray-scale sonography with compression, as well as color and duplex ultrasound, were performed to evaluate the deep venous system(s) from the level of the common femoral vein through the popliteal and proximal calf veins. COMPARISON:  None Available. FINDINGS: VENOUS Normal compressibility of the common femoral, superficial femoral, and popliteal veins. The calf veins are not well visualized bilaterally due to the patient's body habitus. Visualized portions of profunda femoral vein and great saphenous vein unremarkable. No filling defects to suggest DVT on grayscale or color Doppler imaging. Doppler waveforms show normal direction of venous flow, normal respiratory plasticity and response to augmentation. OTHER None. Limitations: none IMPRESSION: 1. No evidence of femoropopliteal DVT bilaterally Electronically Signed   By: Fidela Salisbury M.D.   On: 02/01/2022 01:48   CT  ABDOMEN PELVIS W CONTRAST  Addendum Date: 02/01/2022   ADDENDUM REPORT: 02/01/2022 00:53 ADDENDUM: In retrospect, there are nondisplaced insufficiency fractures of the anterior right and left aspects of the sacral ala at S2, with fracture continuing across the ventral S2 body where there is inward cortical buckling. There is also a nondisplaced fracture of the left L5 transverse process. The sacral fracture likely accounts for the minimal presacral fluid and stranding noted, although the patient may still have a mild proctitis. Electronically Signed   By: Telford Nab M.D.   On: 02/01/2022 00:53   Result Date: 02/01/2022 CLINICAL DATA:  Fall injury January 11th with continued or worsening back pain. Loss of ambulatory status since then. History of chronic neurogenic claudication from lumbar stenosis. EXAM: CT ABDOMEN AND PELVIS WITH CONTRAST TECHNIQUE: Multidetector CT imaging of the abdomen and pelvis was performed using the standard protocol following bolus administration of intravenous contrast. RADIATION DOSE REDUCTION: This exam was performed according to the departmental dose-optimization program which includes automated exposure control, adjustment of the mA and/or kV according to patient size and/or use of iterative reconstruction technique. CONTRAST:  138mL OMNIPAQUE IOHEXOL 300 MG/ML  SOLN COMPARISON:  CT with IV contrast 01/14/2021. FINDINGS: Lower chest: Chronic scarring changes again noted in the base of the right middle lobe and lingula. Lung bases are  otherwise clear. There is mild cardiomegaly. Hepatobiliary: No focal liver abnormality is seen. No calcified gallstones, gallbladder wall thickening, or biliary dilatation. Pancreas: Diffuse fatty atrophy. No focal abnormality. No abnormality. No splenomegaly. Spleen: No abnormality. Adrenals/Urinary Tract: There is no adrenal mass. There is a 1.2 cm hyperdense cyst in the posterior aspect of the left kidney which today measures 78 Hounsfield units  both in the initial postcontrast phase and in the delayed phase, previously measuring 7.7 Hounsfield units. There are scattered bilateral renal cortical scar-like defects, bilateral small renal lengths 8 cm on the right and 8.2 cm on the left with cortical thinning. No adrenal mass is seen. There is no urinary stone or obstruction. Bilateral week excretion of contrast is noted in the delayed phase. Stomach/Bowel: The stomach and unopacified small bowel are unremarkable. The appendix is normal caliber, retrocecal. There is moderate retained stool ascending and transverse colon. Left-sided diverticula. No findings of acute diverticulitis or colitis. Possible however that the patient could have Proctitis or rectal infiltrating disease, with posterior thickening in the distal rectal wall noted and trace stranding which could be inflammatory or congestive, minimal presacral ascites also seen in the area. Vascular/Lymphatic: Aortic atherosclerosis. No enlarged abdominal or pelvic lymph nodes. Reproductive: Status post hysterectomy. No adnexal masses. Other: As above there is minimal presacral ascites. There is a small umbilical fat hernia with no incarcerated hernia. There is no free hemorrhage, abscess or free air. Musculoskeletal: No acute lumbar compression fracture is evident. Osteopenia and degenerative change of the lumbar spine are again noted. There is acquired spinal stenosis at L3-4 and L4-5, severe and unchanged. Variable foraminal stenosis multiple lumbar levels greatest at L3-4, L4-5 and L5-S1. There is bilateral hip DJD with an old right hip nailing. Chronic fracture deformities of the right pelvic rami. IMPRESSION: 1. No acute trauma related findings in the abdomen or pelvis. 2. Constipation and diverticulosis. 3. Posterior rectal wall thickening with trace stranding and minimal presacral ascites. Findings could be due to proctitis or rectal infiltrating disease. Further evaluation recommended. 4. 1.2 cm  hyperdense cyst in the posterior left kidney, was previously much lower in density. 5. Aortic atherosclerosis. 6. Cardiomegaly. 7. Osteopenia and degenerative change with acquired spinal stenosis at L3-4 and L4-5, and multilevel foraminal stenosis. 8. Umbilical fat hernia. Aortic Atherosclerosis (ICD10-I70.0). Electronically Signed: By: Keith  Chesser M.D. On: 02/01/2022 00:24   CT L-SPINE NO CHARGE  Result Date: 02/01/2022 CLINICAL DATA:  Back pain, frequent falls, and recent loss of ambulation. EXAM: CT LUMBAR SPINE WITHOUT CONTRAST TECHNIQUE: Multidetector CT imaging of the lumbar spine was performed without intravenous contrast administration. Multiplanar CT image reconstructions were also generated. RADIATION DOSE REDUCTION: This exam was performed according to the departmental dose-optimization program which includes automated exposure control, adjustment of the mA and/or kV according to patient size and/or use of iterative reconstruction technique. COMPARISON:  MRI lumbar spine from 12/16/2019 and 04/29/2021 FINDINGS: Segmentation: 5 lumbar type vertebrae. Alignment: Grade 1 degenerative anterolisthesis again noted at L3-4 and L4-5, unchanged. No other alignment abnormality. Vertebrae: Osteopenia. There is a nondisplaced transverse fracture of the left L5 transverse process. No other lumbar fracture or destructive bone lesion is seen. There is lumbar spondylosis. A prominent upper plate Schmorl's node is again shown at L2, additional Schmorl's node formation opposite L3-4, L4-5, and L5-S1. Paraspinal and other soft tissues: Partial fatty atrophy in the intrinsic back musculature similar to prior study. There is aortic atherosclerosis without AAA. There is no paraspinal hematoma. Disc levels: T10-11: There are   anterior osteophytes with attempted bridging. Mild disc space loss. No herniation or stenosis. T11-12: No herniation or stenosis. T12-L1: No herniation or stenosis. L1-2: There is a small calcified  right paracentral disc extrusion impressing on the right ventral thecal sac. Similar mild disc space loss. Mild facet joint spurring and mild foraminal stenosis are again shown. There is mild spinal stenosis due to ligamentous and facet hypertrophy and the disc extrusion. Mild degenerative foraminal stenosis. L2-3: There is preservation of the normal disc height. Dorsal ligamentous and facet hypertrophy are again noted and a posterior disc bulge, causing moderate acquired spinal canal stenosis. There is only mild foraminal stenosis. L3-4: There is mild disc space loss. Circumferential disc osteophyte complex. This and advanced facet hypertrophy with ligamentous thickening combine to cause severe acquired spinal stenosis, unchanged. There is moderate right and mild to moderate left foraminal stenosis. L4-5: This disc remains normal in height. There are small endplate spurs. Due to a dorsal annular bulge and advanced facet hypertrophy with ligamentous thickening, there is severe acquired spinal canal stenosis and effacement of the thecal sac continuing into the subarticular zones. There is moderate right-greater-than-left foraminal stenosis. L5-S1: There is mild disc space loss. There is a mild nonstenosing posterior disc bulge. Moderate facet hypertrophy is seen with severe right, mild-to-moderate left foraminal stenosis. Other: Patent SI joints with mild spurring and vacuum phenomenon. Noted are nondisplaced acute insufficiency fractures of the anterior right and left aspects of the sacral ala at S2, extending ventrally across the S2 body with inward cortical buckling anteriorly. IMPRESSION: 1. Acute nondisplaced fracture of the left L5 transverse process. No lumbar compression fracture is seen. 2. Acute nondisplaced insufficiency fractures of the anterior right and left aspects of the sacral ala at S2, extending ventrally across the S2 body with inward cortical buckling. 3. Osteopenia and degenerative changes. 4.  Severe acquired spinal canal stenosis at L3-4 and L4-5, moderate at L2-3. 5. Multilevel foraminal stenosis greatest on the right at L5-S1. 6. Aortic atherosclerosis. Aortic Atherosclerosis (ICD10-I70.0). Electronically Signed   By: Telford Nab M.D.   On: 02/01/2022 00:50   DG Chest Portable 1 View  Result Date: 01/31/2022 CLINICAL DATA:  Fall and weakness EXAM: PORTABLE CHEST 1 VIEW COMPARISON:  Chest x-ray 11/11/2019 FINDINGS: Heart is mildly enlarged. There central pulmonary vascular congestion. There is no focal lung infiltrate, pleural effusion or pneumothorax. No acute fractures are seen. IMPRESSION: No active disease. Electronically Signed   By: Ronney Asters M.D.   On: 01/31/2022 23:09    Review of Systems  HENT:  Negative for ear discharge, ear pain, hearing loss and tinnitus.   Eyes:  Negative for photophobia and pain.  Respiratory:  Negative for cough and shortness of breath.   Cardiovascular:  Negative for chest pain.  Gastrointestinal:  Negative for abdominal pain, nausea and vomiting.  Genitourinary:  Negative for dysuria, flank pain, frequency and urgency.  Musculoskeletal:  Positive for arthralgias (BLE) and back pain. Negative for myalgias and neck pain.  Neurological:  Negative for dizziness and headaches.  Hematological:  Does not bruise/bleed easily.  Psychiatric/Behavioral:  The patient is not nervous/anxious.    Blood pressure 121/71, pulse 77, temperature 98.6 F (37 C), temperature source Oral, resp. rate 16, SpO2 100 %. Physical Exam Constitutional:      General: She is not in acute distress.    Appearance: She is well-developed. She is not diaphoretic.  HENT:     Head: Normocephalic and atraumatic.  Eyes:     General: No  scleral icterus.       Right eye: No discharge.        Left eye: No discharge.     Conjunctiva/sclera: Conjunctivae normal.  Cardiovascular:     Rate and Rhythm: Normal rate and regular rhythm.  Pulmonary:     Effort: Pulmonary effort is  normal. No respiratory distress.  Musculoskeletal:     Cervical back: Normal range of motion.     Comments: Pelvis--no traumatic wounds or rash, no ecchymosis, stable to manual stress, pain with lateral compression  BLE No traumatic wounds, ecchymosis, or rash  Nontender  No knee or ankle effusion  Knee stable to varus/ valgus and anterior/posterior stress  Sens DPN, SPN, TN absent  Motor EHL, ext, flex, evers 4/5  DP 1+, PT 1+, No significant edema  Skin:    General: Skin is warm and dry.  Neurological:     Mental Status: She is alert.  Psychiatric:        Mood and Affect: Mood normal.        Behavior: Behavior normal.     Assessment/Plan: Sacral fxs -- Plan SI screw fixation today with Dr. Jena Gauss. Please keep NPO. Multiple medical problems including HTN, hypothyroidism, RA on immunosupressants, Paroxysmal a.fib not on anticoagulation, chronic back pain on opiates, anxiety, depression,  left L5 transverse process fracture s/p fall with known lumbar stenosis -- per primary service    Freeman Caldron, PA-C Orthopedic Surgery [phone] 02/02/2022, 9:28 AM    Patient seen and examined and agree with note above.  Patient with a history of spinal stenosis with weakening of legs and ability to transfer but not really ambulatory.  She sustained a fall on January 11 and has progressively declined since that time with worsening low back/posterior pelvic pain.  Eventually she presented to the emergency room where x-rays and CT scan showed a U-type sacral insufficiency fracture.  Due to the radiographic findings and the progressive pain and limitations with mobility I discussed with the patient and her family percutaneous fixation of the posterior pelvic ring.  We discussed risk and benefits to surgical intervention.  Risks included but not limited to bleeding, infection, malunion, nonunion, hardware failure, hardware irritation, nerve or blood vessel injury, DVT, even the possibility  anesthetic complications.  They agreed to proceed with surgery and consent was obtained.  Roby Lofts, MD [facility] [phone] (office) [email or web address]

## 2022-02-02 NOTE — Progress Notes (Addendum)
Pt has bed assignment to [facility] room 30. Report called to [facility], voicemail left for daughter Kathlee Nations per pt request.   [phone]- Pt left off unit via [facility] to [facility].

## 2022-02-02 NOTE — H&P (Signed)
History and Physical    Patient: Laura [name] HQI:[id number] DOB: 1945/06/26 DOA: 02/02/2022 DOS: the patient was seen and examined on 02/02/2022 PCP: Adin Hector, MD  Patient coming from: Home  Chief Complaint: No chief complaint on file.  HPI: Laura [name] is a 77 y.o. female with k/h/o HTN, hypothyroidism, RA on immunosupressants, Paroxysmal a.fib not on anticoagulation, chronic back pain on opiates, anxiety, depression,  left L5 transverse process fracture s/p fall with known lumbar stenosis.  She has neurogenic claudication from that. She has an acute sacral fracture consistent with a sacral insufficiency fracture, Neurosurgery Dr Izora Ribas reviewed the films with Dr Haddix/orthopedic trauma attendings at [facility] who felt that she may benefit from stabilization given that she is having significant difficulty with ambulation due to pain.  Pelvic stabilization is not offered at this facility, so transfer will be necessary for higher level of care where orthopedic trauma specialists are more available  Review of Systems: As mentioned in the history of present illness. All other systems reviewed and are negative. Past Medical History:  Diagnosis Date   Allergy    Anxiety    Arthritis, degenerative 10/08/2013   Overview:    a.  Lumbar spine/spinal stenosis/foot drop.   b.  Hands.    Asthma    Chronic hand pain, left 10/10/2019   Chronic kidney disease    Depression    Lumbar spinal stenosis with neurogenic claudication 11/07/2014   Major depression, single episode, in complete remission ([place]) 06/25/2015   Memory loss, short term 03/19/2014   Rheumatoid arthritis ([place])    Sciatica of left side 12/31/2019   Seizure ([place]) 10/07/2014   Sleep apnea    Sleep apnea    Thyroid disease    Past Surgical History:  Procedure Laterality Date   ABDOMINAL HYSTERECTOMY     CESAREAN SECTION     HIP SURGERY Right    REPLACEMENT TOTAL KNEE Left    Social History:  reports that  she has never smoked. She has never used smokeless tobacco. She reports that she does not drink alcohol and does not use drugs.  Allergies  Allergen Reactions   Bupropion Other (See Comments)   Jardiance [Empagliflozin] Other (See Comments)   Penicillins Rash    Other Reaction(s): Not available    Family History  Problem Relation Age of Onset   Hypertension Mother    Stroke Mother    Heart attack Father    Alcohol abuse Father    Depression Father    Post-traumatic stress disorder Father    Rheum arthritis Sister    Hypertension Sister    Depression Sister    Breast cancer Neg Hx     Prior to Admission medications   Medication Sig Start Date End Date Taking? Authorizing Provider  albuterol (VENTOLIN HFA) 108 (90 Base) MCG/ACT inhaler Inhale 2 puffs into the lungs every 6 (six) hours as needed for wheezing or shortness of breath. 04/30/19   Kendell Bane, NP  aspirin EC 81 MG tablet Take by mouth.    [provider]  azelastine (ASTELIN) 0.1 % nasal spray Place 1 spray into both nostrils daily as needed for rhinitis. 02/26/19   [provider]  doxepin (SINEQUAN) 10 MG capsule Take 10 mg by mouth.    [provider]  DULoxetine (CYMBALTA) 60 MG capsule TAKE 1 CAPSULE BY MOUTH EVERY DAY 07/20/21   Ursula Alert, MD  DULoxetine (CYMBALTA) 60 MG capsule Take 1 capsule (60 mg total) by  mouth daily. 02/01/22   Jomarie Longs, MD  folic acid (FOLVITE) 1 MG tablet Take 1 mg by mouth daily.    [provider]  furosemide (LASIX) 80 MG tablet Take 80 mg by mouth every Monday, Wednesday, and Friday. Takes MWF 09/16/21   [provider]  hydroxychloroquine (PLAQUENIL) 200 MG tablet Take 100 mg by mouth daily.     [provider]  inFLIXimab (REMICADE) 100 MG injection Inject 100 mg into the vein every 8 (eight) weeks.    [provider]  levothyroxine (SYNTHROID) 125 MCG tablet Take 250 mcg by mouth daily before breakfast.     [provider]  loratadine (CLARITIN) 10 MG tablet Take 10 mg by mouth daily.  04/05/14   [provider]  methotrexate (RHEUMATREX) 2.5 MG tablet TAKE 4 TABLETS (10 MG TOTAL) BY MOUTH EVERY 7 (SEVEN) DAYS WITH A MEAL 03/15/16   [provider]  metoprolol succinate (TOPROL-XL) 100 MG 24 hr tablet Take 1 tablet (100 mg total) by mouth daily. Take with or immediately following a meal. 10/10/18   Mayo, Allyn Kenner, MD  Multiple Vitamin (MULTI-VITAMINS) TABS Take 1 tablet by mouth daily.    [provider]  naloxone [facility]) nasal spray 4 mg/0.1 mL Place 1 spray into the nose as needed for up to 365 doses (for opioid-induced respiratory depresssion). In case of emergency (overdose), spray once into each nostril. If no response within 3 minutes, repeat application and call 911. 12/22/21 12/22/22  Delano Metz, MD  oxyCODONE (OXY IR/ROXICODONE) 5 MG immediate release tablet Take 1 tablet (5 mg total) by mouth every 6 (six) hours as needed for severe pain. Must last 30 days 01/14/22 02/13/22  Delano Metz, MD  oxyCODONE (OXY IR/ROXICODONE) 5 MG immediate release tablet Take 1 tablet (5 mg total) by mouth every 6 (six) hours as needed for severe pain. Must last 30 days 02/13/22 03/15/22  Delano Metz, MD  oxyCODONE (OXY IR/ROXICODONE) 5 MG immediate release tablet Take 1 tablet (5 mg total) by mouth every 6 (six) hours as needed for severe pain. Must last 30 days 03/15/22 04/14/22  Delano Metz, MD  potassium chloride (KLOR-CON) 10 MEQ tablet Take 10 mEq by mouth every Monday, Wednesday, and Friday.    [provider]  rosuvastatin (CRESTOR) 5 MG tablet Take 5 mg by mouth 2 (two) times a week. 12/23/18   [provider]    Physical Exam: Vitals:   02/02/22 0636  BP: 121/71  Pulse: 77  Resp: 16  Temp: 98.6 F (37 C)  TempSrc: Oral  SpO2: 100%   General exam: Conversant, in no acute distress Respiratory system: normal chest rise, clear, no  audible wheezing Cardiovascular system: regular rhythm, s1-s2 Gastrointestinal system: Nondistended, nontender, pos BS Central nervous system: No seizures, no tremors Extremities: No cyanosis, no joint deformities Skin: No rashes, no pallor Psychiatry: Affect normal // no auditory hallucinations   Data Reviewed:  Labs reviewed: Na 137, K 3.5, Cr 1.09  Assessment and Plan: Sacral insufficiency fracture L5 transverse process fracture secondary to mechanical fall Intractable back pain with ambulatory dysfunction Chronic back pain on chronic opiates Chronic physical deconditioning Continue analgesia -Discussed with Orthopedic Surgery, plan for surgery today   Paroxysmal atrial fibrillation (HCC) Continue metoprolol and aspirin Not on systemic anticoagulation as paroxysmal A-fib was in the setting of acute infection Rate controlled   Coronary artery disease involving native coronary artery of native heart without angina pectoris Continue aspirin, rosuvastatin and metoprolol once confirmed  by pharmacy   GAD (generalized anxiety disorder) Continue home duloxetine, hydroxyzine and doxepin once confirmed by pharmacy   Seronegative rheumatoid arthritis ([place]) On methotrexate and Remicade and Plaquenil   Type 2 diabetes mellitus (HCC) Sliding scale insulin coverage   Congestive heart failure with left ventricular systolic dysfunction (HCC) Continue Lasix and metoprolol   Hypothyroidism Continue levothyroxine once confirmed by pharmacy    Advance Care Planning:   Code Status: Prior Full  Consults: Orthopedic Surgery  Family Communication: Pt in room  Severity of Illness: The appropriate patient status for this patient is INPATIENT. Inpatient status is judged to be reasonable and necessary in order to provide the required intensity of service to ensure the patient's safety. The patient's presenting symptoms, physical exam findings, and initial radiographic and laboratory data in  the context of their chronic comorbidities is felt to place them at high risk for further clinical deterioration. Furthermore, it is not anticipated that the patient will be medically stable for discharge from the hospital within 2 midnights of admission.   * I certify that at the point of admission it is my clinical judgment that the patient will require inpatient hospital care spanning beyond 2 midnights from the point of admission due to high intensity of service, high risk for further deterioration and high frequency of surveillance required.*  Author: Marylu Lund, MD 02/02/2022 9:05 AM  For on call review [email or web address].

## 2022-02-02 NOTE — Progress Notes (Signed)
Pt arrived in the unit 5N30 alert and oriented, vitals, initial assessment and skin checks done. TRH Admits and Consults was paged thru [facility] for attending doctor and admission orders.

## 2022-02-02 NOTE — Consult Note (Addendum)
Reason for Consult:Sacral fxs Referring Physician: Rickey Barbara Time called: 6010 Time at bedside: 0904   Laura [name] is an 77 y.o. female.  HPI: Yajayra has fallen several times this month, the first time on the 11th. She began to have leg pain and numbness and then back pain as time went on. It's become unbearable and she has extreme pain with turning. She is essentially non-ambulatory at baseline but was able to transfer unassisted prior to these falls. She lives at home with her husband.  Past Medical History:  Diagnosis Date   Allergy    Anxiety    Arthritis, degenerative 10/08/2013   Overview:    a.  Lumbar spine/spinal stenosis/foot drop.   b.  Hands.    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   Memory loss, short term 03/19/2014   Rheumatoid arthritis (HCC)    Sciatica of left side 12/31/2019   Seizure (HCC) 10/07/2014   Sleep apnea    Sleep apnea    Thyroid disease     Past Surgical History:  Procedure Laterality Date   ABDOMINAL HYSTERECTOMY     CESAREAN SECTION     HIP SURGERY Right    REPLACEMENT TOTAL KNEE Left     Family History  Problem Relation Age of Onset   Hypertension Mother    Stroke Mother    Heart attack Father    Alcohol abuse Father    Depression Father    Post-traumatic stress disorder Father    Rheum arthritis Sister    Hypertension Sister    Depression Sister    Breast cancer Neg Hx     Social History:  reports that she has never smoked. She has never used smokeless tobacco. She reports that she does not drink alcohol and does not use drugs.  Allergies:  Allergies  Allergen Reactions   Bupropion Other (See Comments)   Jardiance [Empagliflozin] Other (See Comments)   Penicillins Rash    Other Reaction(s): Not available    Medications: I  have reviewed the patient's current medications.  Results for orders placed or performed during the hospital encounter of 01/31/22 (from the past 48 hour(s))  CBC with Differential     Status: Abnormal   Collection Time: 01/31/22  7:56 PM  Result Value Ref Range   WBC 10.9 (H) 4.0 - 10.5 K/uL   RBC 4.37 3.87 - 5.11 MIL/uL   Hemoglobin 12.5 12.0 - 15.0 g/dL   HCT 93.2 35.5 - 73.2 %   MCV 88.8 80.0 - 100.0 fL   MCH 28.6 26.0 - 34.0 pg   MCHC 32.2 30.0 - 36.0 g/dL   RDW 20.2 54.2 - 70.6 %   Platelets 401 (H) 150 - 400 K/uL   nRBC 0.0 0.0 - 0.2 %   Neutrophils Relative % 69 %   Neutro Abs 7.5 1.7 - 7.7 K/uL   Lymphocytes Relative 20 %   Lymphs Abs 2.1 0.7 - 4.0 K/uL   Monocytes Relative 8 %   Monocytes Absolute 0.8 0.1 - 1.0 K/uL   Eosinophils Relative 2 %   Eosinophils Absolute 0.3 0.0 - 0.5 K/uL   Basophils Relative 1 %   Basophils Absolute 0.1 0.0 - 0.1 K/uL   Immature Granulocytes 0 %   Abs Immature Granulocytes 0.04 0.00 - 0.07 K/uL  Comment: Performed at [facility], [address] [place], [place] [address]  Comprehensive metabolic panel     Status: Abnormal   Collection Time: 01/31/22  7:56 PM  Result Value Ref Range   Sodium 134 (L) 135 - 145 mmol/L   Potassium 3.3 (L) 3.5 - 5.1 mmol/L   Chloride 90 (L) 98 - 111 mmol/L   CO2 30 22 - 32 mmol/L   Glucose, Bld 154 (H) 70 - 99 mg/dL    Comment: Glucose reference range applies only to samples taken after fasting for at least 8 hours.   BUN 18 8 - 23 mg/dL   Creatinine, Ser 4.27 0.44 - 1.00 mg/dL   Calcium 8.6 (L) 8.9 - 10.3 mg/dL   Total Protein 7.4 6.5 - 8.1 g/dL   Albumin 3.5 3.5 - 5.0 g/dL   AST 18 15 - 41 U/L   ALT 10 0 - 44 U/L   Alkaline Phosphatase 74 38 - 126 U/L   Total Bilirubin 0.5 0.3 - 1.2 mg/dL   GFR, Estimated >06 >23 mL/min    Comment: (NOTE) Calculated using the CKD-EPI Creatinine Equation (2021)    Anion gap 14 5 - 15    Comment: Performed at [facility], [address], [place], [place] [address]  Lactic acid, plasma     Status: None   Collection Time: 01/31/22  7:56 PM  Result Value Ref Range   Lactic Acid, Venous 1.4 0.5 - 1.9 mmol/L    Comment: Performed at [facility], [address] [place], [place] [address]  Troponin I (High Sensitivity)     Status: Abnormal   Collection Time: 01/31/22  7:56 PM  Result Value Ref Range   Troponin I (High Sensitivity) 23 (H) <18 ng/L    Comment: (NOTE) Elevated high sensitivity troponin I (hsTnI) values and significant  changes across serial measurements may suggest ACS but many other  chronic and acute conditions are known to elevate hsTnI results.  Refer to the "Links" section for chest pain algorithms and additional  guidance. Performed at [facility], [address] [place], [place] [address]   Resp panel by RT-PCR (RSV, Flu A&B, Covid) Anterior Nasal Swab     Status: None   Collection Time: 01/31/22 10:09 PM   Specimen: Anterior Nasal Swab  Result Value Ref Range   SARS Coronavirus 2 by RT PCR NEGATIVE NEGATIVE    Comment: (NOTE) SARS-CoV-2 target nucleic acids are NOT DETECTED.  The SARS-CoV-2 RNA is generally detectable in upper respiratory specimens during the acute phase of infection. The lowest concentration of SARS-CoV-2 viral copies this assay can detect is 138 copies/mL. A negative result does not preclude SARS-Cov-2 infection and should not be used as the sole basis for treatment or other patient management decisions. A negative result may occur with  improper specimen collection/handling, submission of specimen other than nasopharyngeal swab, presence of viral mutation(s) within the areas targeted by this assay, and inadequate number of viral copies(<138 copies/mL). A negative result must be combined with clinical observations, patient history, and epidemiological information. The expected result is Negative.  Fact Sheet for Patients:   BloggerCourse.com  Fact Sheet for Healthcare Providers:  SeriousBroker.it  This test is no t yet approved or cleared by the [place] FDA and  has been authorized for detection and/or diagnosis of SARS-CoV-2 by FDA under an Emergency Use Authorization (EUA). This EUA will remain  in effect (meaning this test can be used) for the  duration of the COVID-19 declaration under Section 564(b)(1) of the Act, 21 U.S.C.section 360bbb-3(b)(1), unless the authorization is terminated  or revoked sooner.       Influenza A by PCR NEGATIVE NEGATIVE   Influenza B by PCR NEGATIVE NEGATIVE    Comment: (NOTE) The Xpert Xpress SARS-CoV-2/FLU/RSV plus assay is intended as an aid in the diagnosis of influenza from Nasopharyngeal swab specimens and should not be used as a sole basis for treatment. Nasal washings and aspirates are unacceptable for Xpert Xpress SARS-CoV-2/FLU/RSV testing.  Fact Sheet for Patients: BloggerCourse.com  Fact Sheet for Healthcare Providers: SeriousBroker.it  This test is not yet approved or cleared by the [place] FDA and has been authorized for detection and/or diagnosis of SARS-CoV-2 by FDA under an Emergency Use Authorization (EUA). This EUA will remain in effect (meaning this test can be used) for the duration of the COVID-19 declaration under Section 564(b)(1) of the Act, 21 U.S.C. section 360bbb-3(b)(1), unless the authorization is terminated or revoked.     Resp Syncytial Virus by PCR NEGATIVE NEGATIVE    Comment: (NOTE) Fact Sheet for Patients: BloggerCourse.com  Fact Sheet for Healthcare Providers: SeriousBroker.it  This test is not yet approved or cleared by the [place] FDA and has been authorized for detection and/or diagnosis of SARS-CoV-2 by FDA under an Emergency Use Authorization (EUA).  This EUA will remain in effect (meaning this test can be used) for the duration of the COVID-19 declaration under Section 564(b)(1) of the Act, 21 U.S.C. section 360bbb-3(b)(1), unless the authorization is terminated or revoked.  Performed at [facility], [address] [place], [place] [address]   Troponin I (High Sensitivity)     Status: Abnormal   Collection Time: 01/31/22 11:04 PM  Result Value Ref Range   Troponin I (High Sensitivity) 25 (H) <18 ng/L    Comment: (NOTE) Elevated high sensitivity troponin I (hsTnI) values and significant  changes across serial measurements may suggest ACS but many other  chronic and acute conditions are known to elevate hsTnI results.  Refer to the "Links" section for chest pain algorithms and additional  guidance. Performed at [facility], [address] [place], [place] [address]   Urinalysis, Routine w reflex microscopic Urine, Clean Catch     Status: Abnormal   Collection Time: 02/02/22  3:00 AM  Result Value Ref Range   Color, Urine YELLOW (A) YELLOW   APPearance CLOUDY (A) CLEAR   Specific Gravity, Urine 1.035 (H) 1.005 - 1.030   pH 6.0 5.0 - 8.0   Glucose, UA NEGATIVE NEGATIVE mg/dL   Hgb urine dipstick NEGATIVE NEGATIVE   Bilirubin Urine NEGATIVE NEGATIVE   Ketones, ur NEGATIVE NEGATIVE mg/dL   Protein, ur 30 (A) NEGATIVE mg/dL   Nitrite NEGATIVE NEGATIVE   Leukocytes,Ua LARGE (A) NEGATIVE   RBC / HPF 0-5 0 - 5 RBC/hpf   WBC, UA >50 (H) 0 - 5 WBC/hpf   Bacteria, UA RARE (A) NONE SEEN   Squamous Epithelial / HPF 0-5 0 - 5 /HPF    Comment: Performed at [facility], [address] [place], [place] [address]  CBC     Status: Abnormal   Collection Time: 02/02/22  5:12 AM  Result Value Ref Range   WBC 8.7 4.0 - 10.5 K/uL   RBC 4.14 3.87 - 5.11 MIL/uL   Hemoglobin 11.9 (L) 12.0 - 15.0 g/dL   HCT 56.2 56.3 - 89.3 %   MCV 90.6 80.0 - 100.0 fL  MCH 28.7 26.0 - 34.0 pg   MCHC 31.7 30.0 - 36.0 g/dL    RDW 13.4 11.5 - 15.5 %   Platelets 364 150 - 400 K/uL   nRBC 0.0 0.0 - 0.2 %    Comment: Performed at [facility], [address] [place], [place] [address]  Basic metabolic panel     Status: Abnormal   Collection Time: 02/02/22  5:12 AM  Result Value Ref Range   Sodium 137 135 - 145 mmol/L   Potassium 3.5 3.5 - 5.1 mmol/L   Chloride 94 (L) 98 - 111 mmol/L   CO2 30 22 - 32 mmol/L   Glucose, Bld 149 (H) 70 - 99 mg/dL    Comment: Glucose reference range applies only to samples taken after fasting for at least 8 hours.   BUN 27 (H) 8 - 23 mg/dL   Creatinine, Ser 1.09 (H) 0.44 - 1.00 mg/dL   Calcium 8.6 (L) 8.9 - 10.3 mg/dL   GFR, Estimated 52 (L) >60 mL/min    Comment: (NOTE) Calculated using the CKD-EPI Creatinine Equation (2021)    Anion gap 13 5 - 15    Comment: Performed at [facility], [address] [place], [place] [address]    US Venous Img Lower Bilateral  Result Date: 02/01/2022 CLINICAL DATA:  Bilateral leg swelling EXAM: BILATERAL LOWER EXTREMITY VENOUS DOPPLER ULTRASOUND TECHNIQUE: Gray-scale sonography with compression, as well as color and duplex ultrasound, were performed to evaluate the deep venous system(s) from the level of the common femoral vein through the popliteal and proximal calf veins. COMPARISON:  None Available. FINDINGS: VENOUS Normal compressibility of the common femoral, superficial femoral, and popliteal veins. The calf veins are not well visualized bilaterally due to the patient's body habitus. Visualized portions of profunda femoral vein and great saphenous vein unremarkable. No filling defects to suggest DVT on grayscale or color Doppler imaging. Doppler waveforms show normal direction of venous flow, normal respiratory plasticity and response to augmentation. OTHER None. Limitations: none IMPRESSION: 1. No evidence of femoropopliteal DVT bilaterally Electronically Signed   By: Fidela Salisbury M.D.   On: 02/01/2022 01:48   CT  ABDOMEN PELVIS W CONTRAST  Addendum Date: 02/01/2022   ADDENDUM REPORT: 02/01/2022 00:53 ADDENDUM: In retrospect, there are nondisplaced insufficiency fractures of the anterior right and left aspects of the sacral ala at S2, with fracture continuing across the ventral S2 body where there is inward cortical buckling. There is also a nondisplaced fracture of the left L5 transverse process. The sacral fracture likely accounts for the minimal presacral fluid and stranding noted, although the patient may still have a mild proctitis. Electronically Signed   By: Telford Nab M.D.   On: 02/01/2022 00:53   Result Date: 02/01/2022 CLINICAL DATA:  Fall injury January 11th with continued or worsening back pain. Loss of ambulatory status since then. History of chronic neurogenic claudication from lumbar stenosis. EXAM: CT ABDOMEN AND PELVIS WITH CONTRAST TECHNIQUE: Multidetector CT imaging of the abdomen and pelvis was performed using the standard protocol following bolus administration of intravenous contrast. RADIATION DOSE REDUCTION: This exam was performed according to the departmental dose-optimization program which includes automated exposure control, adjustment of the mA and/or kV according to patient size and/or use of iterative reconstruction technique. CONTRAST:  138mL OMNIPAQUE IOHEXOL 300 MG/ML  SOLN COMPARISON:  CT with IV contrast 01/14/2021. FINDINGS: Lower chest: Chronic scarring changes again noted in the base of the right middle lobe and lingula. Lung bases are  otherwise clear. There is mild cardiomegaly. Hepatobiliary: No focal liver abnormality is seen. No calcified gallstones, gallbladder wall thickening, or biliary dilatation. Pancreas: Diffuse fatty atrophy. No focal abnormality. No abnormality. No splenomegaly. Spleen: No abnormality. Adrenals/Urinary Tract: There is no adrenal mass. There is a 1.2 cm hyperdense cyst in the posterior aspect of the left kidney which today measures 78 Hounsfield units  both in the initial postcontrast phase and in the delayed phase, previously measuring 7.7 Hounsfield units. There are scattered bilateral renal cortical scar-like defects, bilateral small renal lengths 8 cm on the right and 8.2 cm on the left with cortical thinning. No adrenal mass is seen. There is no urinary stone or obstruction. Bilateral week excretion of contrast is noted in the delayed phase. Stomach/Bowel: The stomach and unopacified small bowel are unremarkable. The appendix is normal caliber, retrocecal. There is moderate retained stool ascending and transverse colon. Left-sided diverticula. No findings of acute diverticulitis or colitis. Possible however that the patient could have Proctitis or rectal infiltrating disease, with posterior thickening in the distal rectal wall noted and trace stranding which could be inflammatory or congestive, minimal presacral ascites also seen in the area. Vascular/Lymphatic: Aortic atherosclerosis. No enlarged abdominal or pelvic lymph nodes. Reproductive: Status post hysterectomy. No adnexal masses. Other: As above there is minimal presacral ascites. There is a small umbilical fat hernia with no incarcerated hernia. There is no free hemorrhage, abscess or free air. Musculoskeletal: No acute lumbar compression fracture is evident. Osteopenia and degenerative change of the lumbar spine are again noted. There is acquired spinal stenosis at L3-4 and L4-5, severe and unchanged. Variable foraminal stenosis multiple lumbar levels greatest at L3-4, L4-5 and L5-S1. There is bilateral hip DJD with an old right hip nailing. Chronic fracture deformities of the right pelvic rami. IMPRESSION: 1. No acute trauma related findings in the abdomen or pelvis. 2. Constipation and diverticulosis. 3. Posterior rectal wall thickening with trace stranding and minimal presacral ascites. Findings could be due to proctitis or rectal infiltrating disease. Further evaluation recommended. 4. 1.2 cm  hyperdense cyst in the posterior left kidney, was previously much lower in density. 5. Aortic atherosclerosis. 6. Cardiomegaly. 7. Osteopenia and degenerative change with acquired spinal stenosis at L3-4 and L4-5, and multilevel foraminal stenosis. 8. Umbilical fat hernia. Aortic Atherosclerosis (ICD10-I70.0). Electronically Signed: By: Almira Bar M.D. On: 02/01/2022 00:24   CT L-SPINE NO CHARGE  Result Date: 02/01/2022 CLINICAL DATA:  Back pain, frequent falls, and recent loss of ambulation. EXAM: CT LUMBAR SPINE WITHOUT CONTRAST TECHNIQUE: Multidetector CT imaging of the lumbar spine was performed without intravenous contrast administration. Multiplanar CT image reconstructions were also generated. RADIATION DOSE REDUCTION: This exam was performed according to the departmental dose-optimization program which includes automated exposure control, adjustment of the mA and/or kV according to patient size and/or use of iterative reconstruction technique. COMPARISON:  MRI lumbar spine from 12/16/2019 and 04/29/2021 FINDINGS: Segmentation: 5 lumbar type vertebrae. Alignment: Grade 1 degenerative anterolisthesis again noted at L3-4 and L4-5, unchanged. No other alignment abnormality. Vertebrae: Osteopenia. There is a nondisplaced transverse fracture of the left L5 transverse process. No other lumbar fracture or destructive bone lesion is seen. There is lumbar spondylosis. A prominent upper plate Schmorl's node is again shown at L2, additional Schmorl's node formation opposite L3-4, L4-5, and L5-S1. Paraspinal and other soft tissues: Partial fatty atrophy in the intrinsic back musculature similar to prior study. There is aortic atherosclerosis without AAA. There is no paraspinal hematoma. Disc levels: T10-11: There are  anterior osteophytes with attempted bridging. Mild disc space loss. No herniation or stenosis. T11-12: No herniation or stenosis. T12-L1: No herniation or stenosis. L1-2: There is a small calcified  right paracentral disc extrusion impressing on the right ventral thecal sac. Similar mild disc space loss. Mild facet joint spurring and mild foraminal stenosis are again shown. There is mild spinal stenosis due to ligamentous and facet hypertrophy and the disc extrusion. Mild degenerative foraminal stenosis. L2-3: There is preservation of the normal disc height. Dorsal ligamentous and facet hypertrophy are again noted and a posterior disc bulge, causing moderate acquired spinal canal stenosis. There is only mild foraminal stenosis. L3-4: There is mild disc space loss. Circumferential disc osteophyte complex. This and advanced facet hypertrophy with ligamentous thickening combine to cause severe acquired spinal stenosis, unchanged. There is moderate right and mild to moderate left foraminal stenosis. L4-5: This disc remains normal in height. There are small endplate spurs. Due to a dorsal annular bulge and advanced facet hypertrophy with ligamentous thickening, there is severe acquired spinal canal stenosis and effacement of the thecal sac continuing into the subarticular zones. There is moderate right-greater-than-left foraminal stenosis. L5-S1: There is mild disc space loss. There is a mild nonstenosing posterior disc bulge. Moderate facet hypertrophy is seen with severe right, mild-to-moderate left foraminal stenosis. Other: Patent SI joints with mild spurring and vacuum phenomenon. Noted are nondisplaced acute insufficiency fractures of the anterior right and left aspects of the sacral ala at S2, extending ventrally across the S2 body with inward cortical buckling anteriorly. IMPRESSION: 1. Acute nondisplaced fracture of the left L5 transverse process. No lumbar compression fracture is seen. 2. Acute nondisplaced insufficiency fractures of the anterior right and left aspects of the sacral ala at S2, extending ventrally across the S2 body with inward cortical buckling. 3. Osteopenia and degenerative changes. 4.  Severe acquired spinal canal stenosis at L3-4 and L4-5, moderate at L2-3. 5. Multilevel foraminal stenosis greatest on the right at L5-S1. 6. Aortic atherosclerosis. Aortic Atherosclerosis (ICD10-I70.0). Electronically Signed   By: Telford Nab M.D.   On: 02/01/2022 00:50   DG Chest Portable 1 View  Result Date: 01/31/2022 CLINICAL DATA:  Fall and weakness EXAM: PORTABLE CHEST 1 VIEW COMPARISON:  Chest x-ray 11/11/2019 FINDINGS: Heart is mildly enlarged. There central pulmonary vascular congestion. There is no focal lung infiltrate, pleural effusion or pneumothorax. No acute fractures are seen. IMPRESSION: No active disease. Electronically Signed   By: Ronney Asters M.D.   On: 01/31/2022 23:09    Review of Systems  HENT:  Negative for ear discharge, ear pain, hearing loss and tinnitus.   Eyes:  Negative for photophobia and pain.  Respiratory:  Negative for cough and shortness of breath.   Cardiovascular:  Negative for chest pain.  Gastrointestinal:  Negative for abdominal pain, nausea and vomiting.  Genitourinary:  Negative for dysuria, flank pain, frequency and urgency.  Musculoskeletal:  Positive for arthralgias (BLE) and back pain. Negative for myalgias and neck pain.  Neurological:  Negative for dizziness and headaches.  Hematological:  Does not bruise/bleed easily.  Psychiatric/Behavioral:  The patient is not nervous/anxious.    Blood pressure 121/71, pulse 77, temperature 98.6 F (37 C), temperature source Oral, resp. rate 16, SpO2 100 %. Physical Exam Constitutional:      General: She is not in acute distress.    Appearance: She is well-developed. She is not diaphoretic.  HENT:     Head: Normocephalic and atraumatic.  Eyes:     General: No  scleral icterus.       Right eye: No discharge.        Left eye: No discharge.     Conjunctiva/sclera: Conjunctivae normal.  Cardiovascular:     Rate and Rhythm: Normal rate and regular rhythm.  Pulmonary:     Effort: Pulmonary effort is  normal. No respiratory distress.  Musculoskeletal:     Cervical back: Normal range of motion.     Comments: Pelvis--no traumatic wounds or rash, no ecchymosis, stable to manual stress, pain with lateral compression  BLE No traumatic wounds, ecchymosis, or rash  Nontender  No knee or ankle effusion  Knee stable to varus/ valgus and anterior/posterior stress  Sens DPN, SPN, TN absent  Motor EHL, ext, flex, evers 4/5  DP 1+, PT 1+, No significant edema  Skin:    General: Skin is warm and dry.  Neurological:     Mental Status: She is alert.  Psychiatric:        Mood and Affect: Mood normal.        Behavior: Behavior normal.     Assessment/Plan: Sacral fxs -- Plan SI screw fixation today with Dr. Jena Gauss. Please keep NPO. Multiple medical problems including HTN, hypothyroidism, RA on immunosupressants, Paroxysmal a.fib not on anticoagulation, chronic back pain on opiates, anxiety, depression,  left L5 transverse process fracture s/p fall with known lumbar stenosis -- per primary service    Freeman Caldron, PA-C Orthopedic Surgery [phone] 02/02/2022, 9:28 AM    Patient seen and examined and agree with note above.  Patient with a history of spinal stenosis with weakening of legs and ability to transfer but not really ambulatory.  She sustained a fall on January 11 and has progressively declined since that time with worsening low back/posterior pelvic pain.  Eventually she presented to the emergency room where x-rays and CT scan showed a U-type sacral insufficiency fracture.  Due to the radiographic findings and the progressive pain and limitations with mobility I discussed with the patient and her family percutaneous fixation of the posterior pelvic ring.  We discussed risk and benefits to surgical intervention.  Risks included but not limited to bleeding, infection, malunion, nonunion, hardware failure, hardware irritation, nerve or blood vessel injury, DVT, even the possibility  anesthetic complications.  They agreed to proceed with surgery and consent was obtained.  Roby Lofts, MD [facility] [phone] (office) [email or web address]

## 2022-02-02 NOTE — Transfer of Care (Signed)
Immediate Anesthesia Transfer of Care Note  Patient: Laura [name]  Procedure(s) Performed: ORIF PELVIC FRACTURE WITH PERCUTANEOUS SCREWS (Bilateral: Pelvis)  Patient Location: PACU  Anesthesia Type:General  Level of Consciousness: drowsy and patient cooperative  Airway & Oxygen Therapy: Patient Spontanous Breathing and Patient connected to nasal cannula oxygen  Post-op Assessment: Report given to RN, Post -op Vital signs reviewed and stable, and Patient moving all extremities X 4  Post vital signs: Reviewed and stable  Last Vitals:  Vitals Value Taken Time  BP 121/92 02/02/22 1425  Temp    Pulse 79 02/02/22 1431  Resp 21 02/02/22 1431  SpO2 92 % 02/02/22 1431  Vitals shown include unvalidated device data.  Last Pain:  Vitals:   02/02/22 1128  TempSrc: Oral  PainSc: 6          Complications: No notable events documented.

## 2022-02-03 DIAGNOSIS — S72001A Fracture of unspecified part of neck of right femur, initial encounter for closed fracture: Secondary | ICD-10-CM

## 2022-02-03 LAB — CBC
HCT: 34.5 % — ABNORMAL LOW (ref 36.0–46.0)
Hemoglobin: 10.9 g/dL — ABNORMAL LOW (ref 12.0–15.0)
MCH: 28.5 pg (ref 26.0–34.0)
MCHC: 31.6 g/dL (ref 30.0–36.0)
MCV: 90.1 fL (ref 80.0–100.0)
Platelets: 415 10*3/uL — ABNORMAL HIGH (ref 150–400)
RBC: 3.83 MIL/uL — ABNORMAL LOW (ref 3.87–5.11)
RDW: 13.2 % (ref 11.5–15.5)
WBC: 9.6 10*3/uL (ref 4.0–10.5)
nRBC: 0 % (ref 0.0–0.2)

## 2022-02-03 LAB — COMPREHENSIVE METABOLIC PANEL
ALT: 10 U/L (ref 0–44)
AST: 17 U/L (ref 15–41)
Albumin: 2.7 g/dL — ABNORMAL LOW (ref 3.5–5.0)
Alkaline Phosphatase: 86 U/L (ref 38–126)
Anion gap: 8 (ref 5–15)
BUN: 28 mg/dL — ABNORMAL HIGH (ref 8–23)
CO2: 30 mmol/L (ref 22–32)
Calcium: 8.4 mg/dL — ABNORMAL LOW (ref 8.9–10.3)
Chloride: 95 mmol/L — ABNORMAL LOW (ref 98–111)
Creatinine, Ser: 1.24 mg/dL — ABNORMAL HIGH (ref 0.44–1.00)
GFR, Estimated: 45 mL/min — ABNORMAL LOW (ref >60)
Glucose, Bld: 212 mg/dL — ABNORMAL HIGH (ref 70–99)
Potassium: 4.5 mmol/L (ref 3.5–5.1)
Sodium: 133 mmol/L — ABNORMAL LOW (ref 135–145)
Total Bilirubin: 0.2 mg/dL — ABNORMAL LOW (ref 0.3–1.2)
Total Protein: 6.1 g/dL — ABNORMAL LOW (ref 6.5–8.1)

## 2022-02-03 LAB — GLUCOSE, CAPILLARY
Glucose-Capillary: 233 mg/dL — ABNORMAL HIGH (ref 70–99)
Glucose-Capillary: 178 mg/dL — ABNORMAL HIGH (ref 70–99)
Glucose-Capillary: 146 mg/dL — ABNORMAL HIGH (ref 70–99)
Glucose-Capillary: 195 mg/dL — ABNORMAL HIGH (ref 70–99)
Glucose-Capillary: 159 mg/dL — ABNORMAL HIGH (ref 70–99)
Glucose-Capillary: 128 mg/dL — ABNORMAL HIGH (ref 70–99)

## 2022-02-03 MED ORDER — LIVING WELL WITH DIABETES BOOK
Freq: Once | Status: AC
  Filled 2022-02-03: qty 1

## 2022-02-03 MED ORDER — OXYCODONE HCL 5 MG PO TABS
5.0000 mg | ORAL_TABLET | ORAL | Status: DC | PRN
  Administered 2022-02-03 – 2022-02-04 (×3): 5 mg via ORAL
  Filled 2022-02-03 (×3): qty 1

## 2022-02-03 MED ORDER — ACETAMINOPHEN 500 MG PO TABS
1000.0000 mg | ORAL_TABLET | Freq: Four times a day (QID) | ORAL | Status: DC
  Administered 2022-02-03 – 2022-02-07 (×15): 1000 mg via ORAL
  Filled 2022-02-03 (×16): qty 2

## 2022-02-03 NOTE — Progress Notes (Signed)
Inpatient Diabetes Program Recommendations  AACE/ADA: New Consensus Statement on Inpatient Glycemic Control (2015)  Target Ranges:  Prepandial:   less than 140 mg/dL      Peak postprandial:   less than 180 mg/dL (1-2 hours)      Critically ill patients:  140 - 180 mg/dL   Lab Results  Component Value Date   GLUCAP 195 (H) 02/03/2022   HGBA1C 6.8 (H) 02/02/2022    Review of Glycemic Control  Latest Reference Range & Units 02/03/22 06:31 02/03/22 07:33 02/03/22 12:34  Glucose-Capillary 70 - 99 mg/dL 178 (H) 146 (H) 195 (H)   Diabetes history: DM 2 Outpatient Diabetes medications: None Current orders for Inpatient glycemic control:  Novolog 0-9 units q 4 hours  Inpatient Diabetes Program Recommendations:    Blood sugars improved with Novolog correction. No oral agents listed for DM at home.  Will talk to patient.   Thanks,  Adah Perl, RN, BC-ADM Inpatient Diabetes Coordinator Pager [phone]  (8a-5p)

## 2022-02-03 NOTE — Progress Notes (Addendum)
Orthopaedic Trauma Progress Note  SUBJECTIVE: Was feeling better yesterday afternoon following surgery but reports increased pain this morning. Does have RA and notes she has pain at baseline.  Receives oxycodone every 6 hours as needed at baseline, we will increase this to every 4 hours as needed to count for increased postop pain.  Patient also noted she has baseline neuropathy BLE. Has not been up out of bed yet since surgery.  No chest pain. No SOB. No nausea/vomiting. No other complaints. Hgb A1C was checked yesterday, result was 6.8%. Patient started on SSI.  OBJECTIVE:  Vitals:   02/02/22 2100 02/03/22 0732  BP:  133/78  Pulse:  83  Resp:  14  Temp: 97.9 F (36.6 C) 98.1 F (36.7 C)  SpO2:  98%    General: Sitting up in bed Respiratory: No increased work of breathing.  Pelvis/BLE: Dressing right hip CDI. Does have some tenderness over lumbar spine. Tolerated passive ankle DF/PF bilaterally. Unable to actively demonstrate this.  Severely diminished/nearly absent sensation to DPN/SPN/TN bilaterally.  This is unchanged from preop exam.  Swelling throughout extremities stable.  1+ DP pulse bilaterally  IMAGING: Stable post op imaging.   LABS:  Results for orders placed or performed during the hospital encounter of 02/02/22 (from the past 24 hour(s))  Surgical pcr screen     Status: Abnormal   Collection Time: 02/02/22 10:32 AM   Specimen: Nasal Mucosa; Nasal Swab  Result Value Ref Range   MRSA, PCR NEGATIVE NEGATIVE   Staphylococcus aureus POSITIVE (A) NEGATIVE  Glucose, capillary     Status: Abnormal   Collection Time: 02/02/22 10:42 AM  Result Value Ref Range   Glucose-Capillary 137 (H) 70 - 99 mg/dL  Glucose, capillary     Status: Abnormal   Collection Time: 02/02/22 12:49 PM  Result Value Ref Range   Glucose-Capillary 152 (H) 70 - 99 mg/dL   Comment 1 Notify RN    Comment 2 Document in Chart   Glucose, capillary     Status: Abnormal   Collection Time: 02/02/22  2:29 PM   Result Value Ref Range   Glucose-Capillary 190 (H) 70 - 99 mg/dL   Comment 1 Notify RN    Comment 2 Document in Chart   Hemoglobin A1c     Status: Abnormal   Collection Time: 02/02/22  3:57 PM  Result Value Ref Range   Hgb A1c MFr Bld 6.8 (H) 4.8 - 5.6 %   Mean Plasma Glucose 148.46 mg/dL  CBC     Status: Abnormal   Collection Time: 02/02/22  3:57 PM  Result Value Ref Range   WBC 8.6 4.0 - 10.5 K/uL   RBC 3.96 3.87 - 5.11 MIL/uL   Hemoglobin 11.6 (L) 12.0 - 15.0 g/dL   HCT 35.8 (L) 36.0 - 46.0 %   MCV 90.4 80.0 - 100.0 fL   MCH 29.3 26.0 - 34.0 pg   MCHC 32.4 30.0 - 36.0 g/dL   RDW 13.3 11.5 - 15.5 %   Platelets 355 150 - 400 K/uL   nRBC 0.0 0.0 - 0.2 %  Creatinine, serum     Status: Abnormal   Collection Time: 02/02/22  3:57 PM  Result Value Ref Range   Creatinine, Ser 1.09 (H) 0.44 - 1.00 mg/dL   GFR, Estimated 52 (L) >60 mL/min  Glucose, capillary     Status: Abnormal   Collection Time: 02/02/22  8:14 PM  Result Value Ref Range   Glucose-Capillary 281 (H) 70 - 99  mg/dL  Glucose, capillary     Status: Abnormal   Collection Time: 02/03/22  1:48 AM  Result Value Ref Range   Glucose-Capillary 233 (H) 70 - 99 mg/dL  Comprehensive metabolic panel     Status: Abnormal   Collection Time: 02/03/22  2:06 AM  Result Value Ref Range   Sodium 133 (L) 135 - 145 mmol/L   Potassium 4.5 3.5 - 5.1 mmol/L   Chloride 95 (L) 98 - 111 mmol/L   CO2 30 22 - 32 mmol/L   Glucose, Bld 212 (H) 70 - 99 mg/dL   BUN 28 (H) 8 - 23 mg/dL   Creatinine, Ser 1.24 (H) 0.44 - 1.00 mg/dL   Calcium 8.4 (L) 8.9 - 10.3 mg/dL   Total Protein 6.1 (L) 6.5 - 8.1 g/dL   Albumin 2.7 (L) 3.5 - 5.0 g/dL   AST 17 15 - 41 U/L   ALT 10 0 - 44 U/L   Alkaline Phosphatase 86 38 - 126 U/L   Total Bilirubin 0.2 (L) 0.3 - 1.2 mg/dL   GFR, Estimated 45 (L) >60 mL/min   Anion gap 8 5 - 15  CBC     Status: Abnormal   Collection Time: 02/03/22  2:06 AM  Result Value Ref Range   WBC 9.6 4.0 - 10.5 K/uL   RBC 3.83 (L)  3.87 - 5.11 MIL/uL   Hemoglobin 10.9 (L) 12.0 - 15.0 g/dL   HCT 34.5 (L) 36.0 - 46.0 %   MCV 90.1 80.0 - 100.0 fL   MCH 28.5 26.0 - 34.0 pg   MCHC 31.6 30.0 - 36.0 g/dL   RDW 13.2 11.5 - 15.5 %   Platelets 415 (H) 150 - 400 K/uL   nRBC 0.0 0.0 - 0.2 %  Glucose, capillary     Status: Abnormal   Collection Time: 02/03/22  6:31 AM  Result Value Ref Range   Glucose-Capillary 178 (H) 70 - 99 mg/dL  Glucose, capillary     Status: Abnormal   Collection Time: 02/03/22  7:33 AM  Result Value Ref Range   Glucose-Capillary 146 (H) 70 - 99 mg/dL    ASSESSMENT: Laura [name] is a 77 y.o. female s/p fall with nondisplaced bilateral sacral fractures, 1 Day Post-Op s/p ORIF PELVIC FRACTURE WITH PERCUTANEOUS SCREWS  CV/Blood loss: Acute blood loss anemia, Hgb 10.9 this morning. Hemodynamically stable  PLAN: Weightbearing: WBAT RLE and LLE ROM: Okay for unrestricted range of motion of the hip and knees as tolerated Incisional and dressing care: Reinforce dressings as needed  Showering: Okay to begin showering and getting incisions wet 02/05/2022 Orthopedic device(s): None  Pain management:  1. Tylenol 1000 mg q 6 hours scheduled 2. Oxycodone 5 mg q 4 hours PRN 4. Dilaudid 0.5-1 mg q 4 hours PRN VTE prophylaxis: Lovenox, SCDs ID:  Ancef 2gm post op Foley/Lines:  No foley, KVO IVFs Medical comorbidities: Newly diagnosed DM, hypothyroidism, RA on immunosuppressants, paroxysmal A-fib, anxiety, depression, chronic pain, left L5 transverse process fracture with known lumbar stenosis Impediments to Fracture Healing: DM.  Vitamin D level pending, will start supplementation as indicated Dispo: PT/OT evaluation today, patient will likely require SNF.  Follow-up on vitamin D level  D/C recommendations: -Oxycodone for pain control -Aspirin 325 mg daily x 30 days for DVT prophylaxis -Possible need for Vit D supplementation  Follow - up plan: 2 weeks after discharge for wound check and repeat  x-rays   Contact information:  Katha Hamming MD, Rushie Nyhan PA-C. After hours and  holidays please check [email or web address] for group call information for [facility]   Thompson Caul, PA-C [phone] (office) [email or web address]

## 2022-02-03 NOTE — Plan of Care (Signed)

## 2022-02-03 NOTE — Anesthesia Postprocedure Evaluation (Signed)
Anesthesia Post Note  Patient: Laura [name]  Procedure(s) Performed: ORIF PELVIC FRACTURE WITH PERCUTANEOUS SCREWS (Bilateral: Pelvis)     Patient location during evaluation: PACU Anesthesia Type: General Level of consciousness: awake and alert Pain management: pain level controlled Vital Signs Assessment: post-procedure vital signs reviewed and stable Respiratory status: spontaneous breathing, nonlabored ventilation and respiratory function stable Cardiovascular status: blood pressure returned to baseline Postop Assessment: no apparent nausea or vomiting Anesthetic complications: no   No notable events documented.            Marthenia Rolling

## 2022-02-03 NOTE — Hospital Course (Signed)
77 y.o. female with k/h/o HTN, hypothyroidism, RA on immunosupressants, Paroxysmal a.fib not on anticoagulation, chronic back pain on opiates, anxiety, depression,  left L5 transverse process fracture s/p fall with known lumbar stenosis.  She has neurogenic claudication from that. She has an acute sacral fracture consistent with a sacral insufficiency fracture, Neurosurgery Dr Izora Ribas reviewed the films with Dr Haddix/orthopedic trauma attendings at [facility] who felt that she may benefit from stabilization given that she is having significant difficulty with ambulation due to pain.  Pelvic stabilization is not offered at this facility, so transfer will be necessary for higher level of care where orthopedic trauma specialists are more available

## 2022-02-03 NOTE — Progress Notes (Addendum)
Inpatient Diabetes Program Recommendations  AACE/ADA: New Consensus Statement on Inpatient Glycemic Control (2015)  Target Ranges:  Prepandial:   less than 140 mg/dL      Peak postprandial:   less than 180 mg/dL (1-2 hours)      Critically ill patients:  140 - 180 mg/dL   Lab Results  Component Value Date   GLUCAP 195 (H) 02/03/2022   HGBA1C 6.8 (H) 02/02/2022    Review of Glycemic Control  Latest Reference Range & Units 02/02/22 20:14 02/03/22 01:48 02/03/22 06:31 02/03/22 07:33 02/03/22 12:34  Glucose-Capillary 70 - 99 mg/dL 281 (H) 233 (H) 178 (H) 146 (H) 195 (H)   Diabetes history: DM (new diagnosis) Outpatient Diabetes medications: None Current orders for Inpatient glycemic control:  Novolog sensitive q 4 hours  Inpatient Diabetes Program Recommendations:    Spoke with patient regarding elevated A1C of 6.8%. She states that she was told in the past that she had "borderline DM" and has been on Jardiance in the past however it made her very sick.  She is familiar with DM b/c her husband has DM, checks his blood sugars, and takes meds.  We briefly discussed glucose goals of 80-130 mg/dL-fasting and <180 mg/dL postprandial.  She has never been on metformin for blood sugars.  This may be a good oral agent to start with to help with insulin resistance. Will order LWWD booklet as well.  She needs a glucose meter as well ([id number]) and close f/u with PCP.  Briefly discussed diabetes basics with patient as well.  Thanks,  Adah Perl, RN, BC-ADM Inpatient Diabetes Coordinator Pager [phone]  (8a-5p)

## 2022-02-03 NOTE — Evaluation (Signed)
Occupational Therapy Evaluation Patient Details Name: Laura [name] MRN: [id number] DOB: 1946/01/10 Today's Date: 02/03/2022   History of Present Illness Patient is a 77 year old female with progressively worsening back pain and fall on 1/11, difficulty mobilizing at home. Sacral insufficiency fracture, L5 transverse process fracture. History of chronic back pain. Pt is a 77 y/o female who had a fall earlier this month and has experienced progressive decline in her ambulatory status and severe pain to where she could not get out of bed.  CT scan showed a confirmed U-type sacral insufficiency fracture with bilateral zone 2 sacral fractures.  She is now s/p percutaneous fixation of bilateral sacral insufficiency fractures on 02/02/2022. PMH significant for anxiety, chronic L hand pain, CKD, lumbar spinal stenosis with neurogenic claudication, RA, sciatica, thyroid disease, L TKA, R hip surgery, L5 transverse process fracture.   Clinical Impression   Pt admitted with Post op bilateral ORIF sacral fractures. Pt currently with functional limitations due to the deficits listed below (see OT Problem List). Pt reports that prior to her fall on 1/11, she was able to transfer to/from her w/c or BSC from her bed. She was not ambulating. Currently, pt is experiencing increased pain, decreased strength, activity tolerance, sitting balance, and endurance requiring increased physical assistance to complete BADL tasks and functional mobility tasks. Pt will benefit from skilled OT to increase their safety and independence with ADL and functional mobility for ADL to facilitate discharge to venue listed below. Pt would benefit from pain management techniques prior to therapy session to help her participate in therapy with less difficulty.        Recommendations for follow up therapy are one component of a multi-disciplinary discharge planning process, led by the attending physician.  Recommendations may be updated  based on patient status, additional functional criteria and insurance authorization.   Follow Up Recommendations  Skilled nursing-short term rehab (<3 hours/day)     Assistance Recommended at Discharge Intermittent Supervision/Assistance  Patient can return home with the following Two people to help with walking and/or transfers;Two people to help with bathing/dressing/bathroom;Help with stairs or ramp for entrance;Assist for transportation    Functional Status Assessment  Patient has had a recent decline in their functional status and demonstrates the ability to make significant improvements in function in a reasonable and predictable amount of time.  Equipment Recommendations  None recommended by OT       Precautions / Restrictions Precautions Precautions: Fall;Back Precaution Booklet Issued: No Precaution Comments: Back precautions for comfort due to L5 TP fracture 01/20/21 Restrictions Weight Bearing Restrictions: Yes      Mobility Bed Mobility Overal bed mobility: Needs Assistance Bed Mobility: Rolling, Sidelying to Sit, Sit to Supine Rolling: Mod assist, +2 for physical assistance Sidelying to sit: Mod assist, +2 for physical assistance, HOB elevated Supine to sit: Max assist, HOB elevated Sit to supine: Max assist, +2 for physical assistance   General bed mobility comments: VC's for log roll technique for comfort as pt reporting increased pain with trunk flexion during initial attempt. Bed pad utilized for full transition to/from EOB. Pt required cues and increased time/effort to be able to scoot R hip out and get both feet on the floor.    Transfers Overall transfer level: Needs assistance Equipment used: None Transfers:  (scooting towards EOB and HOB)            Lateral/Scoot Transfers: Min assist General transfer comment: Pt was able to laterally scoot up towards [facility] prior to initiating  sit>supine. Increased effort but pt able to scoot fairly well with min  assist. Unable to progress to OOB mobility this session.      Balance Overall balance assessment: History of Falls             ADL either performed or assessed with clinical judgement   ADL Overall ADL's : Needs assistance/impaired     Grooming: Oral care;Minimal assistance;Sitting   Upper Body Bathing: Moderate assistance;Sitting   Lower Body Bathing: Total assistance;+2 for physical assistance;Bed level   Upper Body Dressing : Moderate assistance;Sitting   Lower Body Dressing: Total assistance;+2 for physical assistance;Bed level     Toilet Transfer Details (indicate cue type and reason): not safe to assess due to pain level Toileting- Clothing Manipulation and Hygiene: Total assistance;+2 for physical assistance;Bed level               Vision Baseline Vision/History: 1 Wears glasses Ability to See in Adequate Light: 0 Adequate Patient Visual Report: No change from baseline Vision Assessment?: No apparent visual deficits            Pertinent Vitals/Pain Pain Assessment Faces Pain Scale: Hurts whole lot Pain Location: lower back/sacral area with shooting pain down bilateral buttocks with movement Pain Descriptors / Indicators: Grimacing, Guarding, Sore, Other (Comment) (yelling) Pain Intervention(s): Limited activity within patient's tolerance, Monitored during session, Repositioned     Hand Dominance Right   Extremity/Trunk Assessment Upper Extremity Assessment Upper Extremity Assessment: Overall WFL for tasks assessed   Lower Extremity Assessment Lower Extremity Assessment: Defer to PT evaluation RLE Deficits / Details: Pt able to actively move the RLE more than the LLE. Very minimal ankle DF noted actively and pt not able to lift LE or bend knee in bed. Once sitting up, pt able to perform LAQ (unable to get full range). Pt reports numbness from knee down. Foot and lower leg edematous. RLE Sensation: decreased light touch LLE Deficits / Details: Very  minimal ankle DF noted. No active hip or knee movement noted in bed and unable to demonstrate active LAQ EOB.   Cervical / Trunk Assessment Cervical / Trunk Assessment: Other exceptions Cervical / Trunk Exceptions: Forward head posture with rounded shoulders. L5 TP fracture.   Communication Communication Communication: No difficulties   Cognition Arousal/Alertness: Awake/alert Behavior During Therapy: Anxious Overall Cognitive Status: Impaired/Different from baseline Area of Impairment: Attention     Current Attention Level: Sustained     General Comments: Pain limiting ability to follow all commands/education consistently.                Home Living Family/patient expects to be discharged to:: Private residence Living Arrangements: Spouse/significant other Available Help at Discharge: Family Type of Home: House Home Access: Stairs to enter [employer] of Steps: 0-2?   Home Layout: One level     Bathroom Shower/Tub: Producer, television/film/video: Handicapped height     Home Equipment: Rollator (4 wheels);Wheelchair - manual (adjustable bed)   Additional Comments: Prior therapy notes state 2 STE, pt now stating no STE.      Prior Functioning/Environment Prior Level of Function : Independent/Modified Independent             Mobility Comments: Pt reports she was able to transfer herself to/from w/c or BSC prior to admit but not ambulating.          OT Problem List: Decreased strength;Obesity;Decreased activity tolerance;Pain;Impaired balance (sitting and/or standing)      OT Treatment/Interventions: Self-care/ADL training;Therapeutic  activities;Therapeutic exercise;Neuromuscular education;Energy conservation;DME and/or AE instruction;Manual therapy;Modalities;Patient/family education;Balance training    OT Goals(Current goals can be found in the care plan section) Acute Rehab OT Goals Patient Stated Goal: to go home OT Goal Formulation:  With patient Time For Goal Achievement: 02/17/22 Potential to Achieve Goals: Good  OT Frequency: Min 2X/week    Co-evaluation   Reason for Co-Treatment: Complexity of the patient's impairments (multi-system involvement);For patient/therapist safety;To address functional/ADL transfers PT goals addressed during session: Mobility/safety with mobility;Balance;Strengthening/ROM        AM-PAC OT "6 Clicks" Daily Activity     Outcome Measure Help from another person eating meals?: A Little Help from another person taking care of personal grooming?: A Little Help from another person toileting, which includes using toliet, bedpan, or urinal?: Total Help from another person bathing (including washing, rinsing, drying)?: Total Help from another person to put on and taking off regular upper body clothing?: A Little Help from another person to put on and taking off regular lower body clothing?: Total 6 Click Score: 12   End of Session    Activity Tolerance: Patient limited by pain Patient left: in bed;with call bell/phone within reach;with bed alarm set  OT Visit Diagnosis: Unsteadiness on feet (R26.81);Muscle weakness (generalized) (M62.81);Repeated falls (R29.6)                Time: 1440-1510 OT Time Calculation (min): 30 min Charges:  OT General Charges $OT Visit: 1 Visit OT Evaluation $OT Eval High Complexity: 1 High  Ailene Ravel, OTR/L,CBIS  Supplemental OT - [facility] and [facility] Secure Chat Preferred    Arden Axon, Clarene Duke 02/03/2022, 5:36 PM

## 2022-02-03 NOTE — Progress Notes (Signed)
  Progress Note   Patient: Laura [name] VFI:[id number] DOB: Jun 15, 1945 DOA: 02/02/2022     1 DOS: the patient was seen and examined on 02/03/2022   Brief hospital course: 77 y.o. female with k/h/o HTN, hypothyroidism, RA on immunosupressants, Paroxysmal a.fib not on anticoagulation, chronic back pain on opiates, anxiety, depression,  left L5 transverse process fracture s/p fall with known lumbar stenosis.  She has neurogenic claudication from that. She has an acute sacral fracture consistent with a sacral insufficiency fracture, Neurosurgery Dr Izora Ribas reviewed the films with Dr Haddix/orthopedic trauma attendings at [facility] who felt that she may benefit from stabilization given that she is having significant difficulty with ambulation due to pain.  Pelvic stabilization is not offered at this facility, so transfer will be necessary for higher level of care where orthopedic trauma specialists are more available   Assessment and Plan: Sacral insufficiency fracture L5 transverse process fracture secondary to mechanical fall Intractable back pain with ambulatory dysfunction Chronic back pain on chronic opiates Chronic physical deconditioning -now s/p fixation of B sacral insufficiency fractures 1/24 -f/u with PT/OT recs -Cont analgesia as needed   Paroxysmal atrial fibrillation (HCC) Continue metoprolol and aspirin Not on systemic anticoagulation as paroxysmal A-fib was in the setting of acute infection Remains rate controlled   Coronary artery disease involving native coronary artery of native heart without angina pectoris Continue aspirin, rosuvastatin and metoprolol  -Seems stable   GAD (generalized anxiety disorder) Continue home duloxetine, hydroxyzine -Per pharmacy, doxepin has been ineffective for pt historically   Seronegative rheumatoid arthritis ([place]) On methotrexate and Remicade and Plaquenil   Type 2 diabetes mellitus ([place]) Sliding scale insulin coverage Seen by  diabetic coordinator   Congestive heart failure with left ventricular systolic dysfunction (HCC) Continue Lasix and metoprolol   Hypothyroidism Continue levothyroxine as tolerated      Subjective: Without complaints this AM. Eager to go home soon  Physical Exam: Vitals:   02/02/22 1527 02/02/22 1958 02/02/22 2100 02/03/22 0732  BP: (!) 104/59 (!) 117/59  133/78  Pulse: 73 75  83  Resp: 17 18  14   Temp: 97.8 F (36.6 C)  97.9 F (36.6 C) 98.1 F (36.7 C)  TempSrc: Oral  Oral Oral  SpO2: 100%   98%   General exam: Awake, laying in bed, in nad Respiratory system: Normal respiratory effort, no wheezing Cardiovascular system: regular rate, s1, s2 Gastrointestinal system: Soft, nondistended, positive BS Central nervous system: CN2-12 grossly intact, strength intact Extremities: Perfused, no clubbing Skin: Normal skin turgor, no notable skin lesions seen Psychiatry: Mood normal // no visual hallucinations   Data Reviewed:  Labs reviewed: Na 133, K 4.5, Cr 1.24, Hgb 10.9  Family Communication: Pt in room, family not at bedside  Disposition: Status is: Inpatient Remains inpatient appropriate because: Severity of illness  Planned Discharge Destination:  Unclear at this time, pending PT/OT eval    Author: Marylu Lund, MD 02/03/2022 3:42 PM  For on call review [email or web address].

## 2022-02-03 NOTE — Evaluation (Signed)
Physical Therapy Evaluation  Patient Details Name: Laura [name] MRN: [id number] DOB: 01/28/1945 Today's Date: 02/03/2022  History of Present Illness  Patient is a 77 year old female with progressively worsening back pain and fall on 1/11, difficulty mobilizing at home. Sacral insufficiency fracture, L5 transverse process fracture. History of chronic back pain. Pt is a 76 y/o female who had a fall earlier this month and has experienced progressive decline in her ambulatory status and severe pain to where she could not get out of bed.  CT scan showed a confirmed U-type sacral insufficiency fracture with bilateral zone 2 sacral fractures.  She is now s/p percutaneous fixation of bilateral sacral insufficiency fractures on 02/02/2022. PMH significant for anxiety, chronic L hand pain, CKD, lumbar spinal stenosis with neurogenic claudication, RA, sciatica, thyroid disease, L TKA, R hip surgery, L5 transverse process fracture.   Clinical Impression  Pt admitted with above diagnosis. Pt currently with functional limitations due to the deficits listed below (see PT Problem List). At the time of PT eval pt was able to perform transfers to/from EOB with +2 assist. Pt unable to tolerate progression of mobility due to pain. Very insistent she wants to return home at d/c where she will have husband's support, however based on performance today, SNF would be the most appropriate d/c discharge disposition. Pt is hopeful for improvement in tolerance for functional mobility tomorrow and is motivated to go home. Acutely, pt will benefit from skilled PT to increase their independence and safety with mobility to allow discharge to the venue listed below.         Recommendations for follow up therapy are one component of a multi-disciplinary discharge planning process, led by the attending physician.  Recommendations may be updated based on patient status, additional functional criteria and insurance  authorization.  Follow Up Recommendations Skilled nursing-short term rehab (<3 hours/day) Can patient physically be transported by private vehicle: No    Assistance Recommended at Discharge Frequent or constant Supervision/Assistance  Patient can return home with the following  Two people to help with walking and/or transfers;A lot of help with bathing/dressing/bathroom;Help with stairs or ramp for entrance;Assist for transportation;Assistance with cooking/housework    Equipment Recommendations  (to be determined at next level of care)  Recommendations for Other Services       Functional Status Assessment Patient has had a recent decline in their functional status and demonstrates the ability to make significant improvements in function in a reasonable and predictable amount of time.     Precautions / Restrictions Precautions Precautions: Fall;Back Precaution Booklet Issued: No Precaution Comments: Back precautions for comfort due to L5 TP fracture Restrictions Weight Bearing Restrictions: Yes      Mobility  Bed Mobility Overal bed mobility: Needs Assistance Bed Mobility: Rolling, Sidelying to Sit, Sit to Supine Rolling: Mod assist, +2 for physical assistance Sidelying to sit: Mod assist, +2 for physical assistance, HOB elevated   Sit to supine: Max assist, +2 for physical assistance   General bed mobility comments: VC's for log roll technique for comfort as pt reporting increased pain with trunk flexion during initial attempt. Bed pad utilized for full transition to/from EOB. Pt required cues and increased time/effort to be able to scoot R hip out and get both feet on the floor.    Transfers Overall transfer level: Needs assistance Equipment used: None Transfers:  (Scooting at EOB)            Lateral/Scoot Transfers: Min assist General transfer comment: Pt was  able to laterally scoot up towards [facility] prior to initiating sit>supine. Increased effort but pt able to scoot  fairly well with min assist. Unable to progress to OOB mobility this session.    Ambulation/Gait                  Stairs            Wheelchair Mobility    Modified Rankin (Stroke Patients Only)       Balance Overall balance assessment: History of Falls                                           Pertinent Vitals/Pain Pain Assessment Pain Assessment: Faces Faces Pain Scale: Hurts whole lot Pain Location: lower back/sacral area with shooting pain down bilateral buttocks with movement Pain Descriptors / Indicators: Grimacing, Guarding, Sore Pain Intervention(s): Limited activity within patient's tolerance, Monitored during session, Repositioned    Home Living Family/patient expects to be discharged to:: Private residence Living Arrangements: Spouse/significant other Available Help at Discharge: Family Type of Home: House Home Access: Stairs to enter   [employer] of Steps: 0-2?   Home Layout: One level Home Equipment: Rollator (4 wheels);Wheelchair - manual (adjustable bed) Additional Comments: Prior therapy notes state 2 STE, pt now stating no STE.    Prior Function Prior Level of Function : Independent/Modified Independent             Mobility Comments: Pt reports she was able to transfer herself PTA but not ambulating.       Hand Dominance        Extremity/Trunk Assessment   Upper Extremity Assessment Upper Extremity Assessment: Overall WFL for tasks assessed    Lower Extremity Assessment Lower Extremity Assessment: RLE deficits/detail;LLE deficits/detail RLE Deficits / Details: Pt able to actively move the RLE more than the LLE. Very minimal ankle DF noted actively and pt not able to lift LE or bend knee in bed. Once sitting up, pt able to perform LAQ (unable to get full range). Pt reports numbness from knee down. Foot and lower leg edematous. RLE Sensation: decreased light touch LLE Deficits / Details: Very  minimal ankle DF noted. No active hip or knee movement noted in bed and unable to demonstrate active LAQ EOB.    Cervical / Trunk Assessment Cervical / Trunk Assessment: Other exceptions Cervical / Trunk Exceptions: Forward head posture with rounded shoulders. L5 TP fracture.  Communication   Communication: No difficulties  Cognition Arousal/Alertness: Awake/alert Behavior During Therapy: WFL for tasks assessed/performed Overall Cognitive Status: Impaired/Different from baseline Area of Impairment: Attention                   Current Attention Level: Sustained                    General Comments      Exercises     Assessment/Plan    PT Assessment Patient needs continued PT services  PT Problem List Decreased strength;Decreased range of motion;Decreased activity tolerance;Decreased balance;Decreased mobility;Pain;Decreased safety awareness;Decreased knowledge of use of DME;Decreased knowledge of precautions;Obesity       PT Treatment Interventions DME instruction;Gait training;Stair training;Functional mobility training;Therapeutic activities;Therapeutic exercise;Balance training;Neuromuscular re-education;Cognitive remediation;Patient/family education    PT Goals (Current goals can be found in the Care Plan section)  Acute Rehab PT Goals Patient Stated Goal: to have less pain PT Goal Formulation:  With patient Time For Goal Achievement: 02/17/22 Potential to Achieve Goals: Fair    Frequency Min 3X/week     Co-evaluation PT/OT/SLP Co-Evaluation/Treatment: Yes Reason for Co-Treatment: Complexity of the patient's impairments (multi-system involvement);For patient/therapist safety;To address functional/ADL transfers PT goals addressed during session: Mobility/safety with mobility;Balance;Strengthening/ROM         AM-PAC PT "6 Clicks" Mobility  Outcome Measure Help needed turning from your back to your side while in a flat bed without using bedrails?: A  Lot Help needed moving from lying on your back to sitting on the side of a flat bed without using bedrails?: A Lot Help needed moving to and from a bed to a chair (including a wheelchair)?: Total Help needed standing up from a chair using your arms (e.g., wheelchair or bedside chair)?: Total Help needed to walk in hospital room?: Total Help needed climbing 3-5 steps with a railing? : Total 6 Click Score: 8    End of Session   Activity Tolerance: Patient limited by pain Patient left: in bed;with call bell/phone within reach;with bed alarm set Nurse Communication: Mobility status;Patient requests pain meds PT Visit Diagnosis: Pain;Muscle weakness (generalized) (M62.81) Pain - Right/Left:  (right and left) Pain - part of body:  (lower back/sacral area)    Time: 1440-1511 PT Time Calculation (min) (ACUTE ONLY): 31 min   Charges:   PT Evaluation $PT Eval Moderate Complexity: 1 Mod          Rolinda Roan, PT, DPT Acute Rehabilitation Services Secure Chat Preferred Office: [phone]   Thelma Comp 02/03/2022, 4:13 PM

## 2022-02-04 ENCOUNTER — Encounter (HOSPITAL_COMMUNITY): Payer: Self-pay | Admitting: Student

## 2022-02-04 DIAGNOSIS — S72001A Fracture of unspecified part of neck of right femur, initial encounter for closed fracture: Secondary | ICD-10-CM | POA: Diagnosis not present

## 2022-02-04 DIAGNOSIS — G894 Chronic pain syndrome: Secondary | ICD-10-CM | POA: Diagnosis not present

## 2022-02-04 LAB — COMPREHENSIVE METABOLIC PANEL
ALT: 6 U/L (ref 0–44)
AST: 13 U/L — ABNORMAL LOW (ref 15–41)
Albumin: 2.7 g/dL — ABNORMAL LOW (ref 3.5–5.0)
Alkaline Phosphatase: 78 U/L (ref 38–126)
Anion gap: 10 (ref 5–15)
BUN: 31 mg/dL — ABNORMAL HIGH (ref 8–23)
CO2: 30 mmol/L (ref 22–32)
Calcium: 8.3 mg/dL — ABNORMAL LOW (ref 8.9–10.3)
Chloride: 96 mmol/L — ABNORMAL LOW (ref 98–111)
Creatinine, Ser: 1.1 mg/dL — ABNORMAL HIGH (ref 0.44–1.00)
GFR, Estimated: 52 mL/min — ABNORMAL LOW (ref >60)
Glucose, Bld: 141 mg/dL — ABNORMAL HIGH (ref 70–99)
Potassium: 4 mmol/L (ref 3.5–5.1)
Sodium: 136 mmol/L (ref 135–145)
Total Bilirubin: 0.5 mg/dL (ref 0.3–1.2)
Total Protein: 5.7 g/dL — ABNORMAL LOW (ref 6.5–8.1)

## 2022-02-04 LAB — CBC
HCT: 31.9 % — ABNORMAL LOW (ref 36.0–46.0)
Hemoglobin: 10.4 g/dL — ABNORMAL LOW (ref 12.0–15.0)
MCH: 29.4 pg (ref 26.0–34.0)
MCHC: 32.6 g/dL (ref 30.0–36.0)
MCV: 90.1 fL (ref 80.0–100.0)
Platelets: 373 10*3/uL (ref 150–400)
RBC: 3.54 MIL/uL — ABNORMAL LOW (ref 3.87–5.11)
RDW: 13.7 % (ref 11.5–15.5)
WBC: 11.4 10*3/uL — ABNORMAL HIGH (ref 4.0–10.5)
nRBC: 0 % (ref 0.0–0.2)

## 2022-02-04 LAB — GLUCOSE, CAPILLARY
Glucose-Capillary: 122 mg/dL — ABNORMAL HIGH (ref 70–99)
Glucose-Capillary: 124 mg/dL — ABNORMAL HIGH (ref 70–99)
Glucose-Capillary: 138 mg/dL — ABNORMAL HIGH (ref 70–99)
Glucose-Capillary: 146 mg/dL — ABNORMAL HIGH (ref 70–99)
Glucose-Capillary: 169 mg/dL — ABNORMAL HIGH (ref 70–99)
Glucose-Capillary: 190 mg/dL — ABNORMAL HIGH (ref 70–99)

## 2022-02-04 LAB — VITAMIN D 25 HYDROXY (VIT D DEFICIENCY, FRACTURES)

## 2022-02-04 MED ORDER — ENOXAPARIN SODIUM 60 MG/0.6ML IJ SOSY
0.5000 mg/kg | PREFILLED_SYRINGE | INTRAMUSCULAR | Status: DC
  Administered 2022-02-05: 55 mg via SUBCUTANEOUS
  Filled 2022-02-04: qty 0.6

## 2022-02-04 MED ORDER — OXYCODONE HCL 5 MG PO TABS: 7.5000 mg | ORAL_TABLET | ORAL | Status: DC | PRN

## 2022-02-04 MED ORDER — OXYCODONE HCL 5 MG PO TABS
5.0000 mg | ORAL_TABLET | ORAL | Status: DC | PRN
  Administered 2022-02-04 – 2022-02-07 (×13): 10 mg via ORAL
  Filled 2022-02-04 (×13): qty 2

## 2022-02-04 MED ORDER — VITAMIN D (ERGOCALCIFEROL) 1.25 MG (50000 UNIT) PO CAPS
50000.0000 [IU] | ORAL_CAPSULE | ORAL | Status: DC
  Administered 2022-02-04: 50000 [IU] via ORAL
  Filled 2022-02-04: qty 1

## 2022-02-04 MED ORDER — ASPIRIN 325 MG PO TBEC: 325.0000 mg | DELAYED_RELEASE_TABLET | Freq: Every day | ORAL | 0 refills | Status: DC

## 2022-02-04 MED ORDER — OXYCODONE HCL 10 MG PO TABS: 5.0000 mg | ORAL_TABLET | ORAL | 0 refills | Status: DC | PRN

## 2022-02-04 NOTE — Progress Notes (Signed)
  Progress Note   Patient: Laura [name] IWL:[id number] DOB: 1945-07-17 DOA: 02/02/2022     2 DOS: the patient was seen and examined on 02/04/2022   Brief hospital course: 77 y.o. female with k/h/o HTN, hypothyroidism, RA on immunosupressants, Paroxysmal a.fib not on anticoagulation, chronic back pain on opiates, anxiety, depression,  left L5 transverse process fracture s/p fall with known lumbar stenosis.  She has neurogenic claudication from that. She has an acute sacral fracture consistent with a sacral insufficiency fracture, Neurosurgery Dr Izora Ribas reviewed the films with Dr Haddix/orthopedic trauma attendings at [facility] who felt that she may benefit from stabilization given that she is having significant difficulty with ambulation due to pain.  Pelvic stabilization is not offered at this facility, so transfer will be necessary for higher level of care where orthopedic trauma specialists are more available   Assessment and Plan: Sacral insufficiency fracture L5 transverse process fracture secondary to mechanical fall Intractable back pain with ambulatory dysfunction Chronic back pain on chronic opiates Chronic physical deconditioning -now s/p fixation of B sacral insufficiency fractures 1/24 -f/u with PT/OT recs -Pending SNF placement, TOC following   Paroxysmal atrial fibrillation (HCC) Continue metoprolol and aspirin Not on systemic anticoagulation as paroxysmal A-fib was in the setting of acute infection Remains rate controlled   Coronary artery disease involving native coronary artery of native heart without angina pectoris Continue aspirin, rosuvastatin and metoprolol  -Seems stable   GAD (generalized anxiety disorder) Continue home duloxetine, hydroxyzine -Per pharmacy, doxepin has been ineffective for pt historically   Seronegative rheumatoid arthritis ([place]) On methotrexate and Remicade and Plaquenil   Type 2 diabetes mellitus ([place]) Sliding scale insulin  coverage Seen by diabetic coordinator   Congestive heart failure with left ventricular systolic dysfunction (HCC) Continue Lasix and metoprolol   Hypothyroidism Continue levothyroxine as tolerated  Constipation Continue with bowel regimen as needed      Subjective: Complaining of constipation, actively trying to have BM  Physical Exam: Vitals:   02/03/22 0732 02/03/22 2103 02/04/22 0736 02/04/22 1500  BP: 133/78 (!) 146/64 135/76 108/77  Pulse: 83 75 66 66  Resp: 14 18 16 17   Temp: 98.1 F (36.7 C) 98.5 F (36.9 C) 98.4 F (36.9 C) 97.7 F (36.5 C)  TempSrc: Oral Oral Oral Oral  SpO2: 98% (!) 88% 97% 95%   General exam: Conversant, in no acute distress Respiratory system: normal chest rise, clear, no audible wheezing Cardiovascular system: regular rhythm, s1-s2 Gastrointestinal system: Nondistended, nontender, pos BS Central nervous system: No seizures, no tremors Extremities: No cyanosis, no joint deformities Skin: No rashes, no pallor Psychiatry: Affect normal // no auditory hallucinations   Data Reviewed:  Labs reviewed: Na 136, K 4.0, Cr 1.10, Hgb 10.4  Family Communication: Pt in room, family not at bedside  Disposition: Status is: Inpatient Remains inpatient appropriate because: Severity of illness  Planned Discharge Destination: Skilled nursing facility    Author: Marylu Lund, MD 02/04/2022 5:07 PM  For on call review [email or web address].

## 2022-02-04 NOTE — TOC Initial Note (Addendum)
Transition of Care [facility]) - Initial/Assessment Note    Patient Details  Name: Laura [name] MRN: [id number] Date of Birth: [birth year]  [facility]) CM/SW Contact:    Joanne Chars, LCSW Phone Number: 02/04/2022, 11:31 AM  Clinical Narrative:     CSW spoke with pt regarding DC recommendation for SNF.  Pt agreeable to this, medicare choice document provided.  Pt reports her daughter Kathlee Nations is a PT and she asked CSW to coordinate the SNF facility with Kathlee Nations.  Permission given to speak to daughter and husband Annie Main.  Pt lives with husband, [facility] aide Mickel Crow) in place 3 days per week.  Pt is vaccinated for covid with multiple boosters.  CSW spoke with daughter Kathlee Nations.  Pt from [place].  Kathlee Nations requesting [facility], [facility].  Agreeable to referral being sent to higher rated facilities as well. Does not want pt sent to [facility], [facility], [facility], or [facility]     [facility] is first choice, CSW reached out to [facility], no beds available today, but will keep the referral for possible DC over the weekend.       1530: TC daughter Kathlee Nations, informed of response from [facility] and offer from [facility], she would consider accepting offer at [facility], waiting on other offers as well.          Expected Discharge Plan: Skilled Nursing Facility Barriers to Discharge: Continued Medical Work up, SNF Pending bed offer   Patient Goals and CMS Choice Patient states their goals for this hospitalization and ongoing recovery are:: walk again CMS Medicare.gov Compare Post Acute Care list provided to:: Patient Choice offered to / list presented to : Patient      Expected Discharge Plan and Services In-house Referral: Clinical Social Work   Post Acute Care Choice: [facility] Living arrangements for the past 2 months: [place]                                      Prior Living Arrangements/Services Living arrangements for  the past 2 months: Single Family Home Lives with:: Spouse Patient language and need for interpreter reviewed:: Yes Do you feel safe going back to the place where you live?: Yes      Need for Family Participation in Patient Care: Yes (Comment) Care giver support system in place?: Yes (comment) Current home services: Homehealth aide ([place] aide 3x week) Criminal Activity/Legal Involvement Pertinent to Current Situation/Hospitalization: No - Comment as needed  Activities of Daily Living      Permission Sought/Granted Permission sought to share information with : Family Supports Permission granted to share information with : Yes, Verbal Permission Granted  Share Information with NAME: daughter Kathlee Nations, husband Annie Main  Permission granted to share info w AGENCY: SNF        Emotional Assessment Appearance:: Appears stated age Attitude/Demeanor/Rapport: Engaged Affect (typically observed): Appropriate, Pleasant Orientation: : Oriented to Self, Oriented to Place, Oriented to  Time, Oriented to Situation      Admission diagnosis:  Displaced fracture of right femoral neck ([place]) [S72.001A] Patient Active Problem List   Diagnosis Date Noted   Displaced fracture of right femoral neck ([place]) 02/02/2022   Midline low back pain 02/01/2022   Sacral insufficiency fracture 02/01/2022   Fracture of transverse process of lumbar vertebra, closed, initial encounter ([place]) 02/01/2022   Chronic back pain 02/01/2022  Ambulatory dysfunction 02/01/2022   Fall at home, initial encounter 02/01/2022   Spinal stenosis at L4-L5 level 02/01/2022   Lymphedema 04/26/2021   Generalized osteoarthritis of multiple sites 12/09/2020   Chronic low back pain (1ry area of Pain) (Bilateral) (L>R) w/o sciatica 12/09/2020   Ligamentum flavum hypertrophy (L1-2, L2-3, L3-4) 05/28/2020   Lumbar lateral recess stenosis (Bilateral) (L2-3, L3-4, L4-5) (Severe: L3-4) 05/28/2020   Lumbar Grade 1 Anterolisthesis of L3/L4 and  L4/L5 05/28/2020   Intractable back pain 05/28/2020   Chronic use of opiate for therapeutic purpose 04/14/2020   Statin myopathy 03/20/2020   DDD (degenerative disc disease), lumbosacral 03/10/2020   Abnormal MRI, lumbar spine (04/30/2021) 02/19/2020   Chronic lower extremity pain (2ry area of Pain) (Bilateral) 02/04/2020   Lumbosacral radiculopathy at L5 (Left) 02/04/2020   Chronic anticoagulation ([place]) 01/20/2020   Cerebrovascular accident (CVA) ([place]) 01/02/2020   Sciatica (Left) 12/31/2019   Dropfoot (Left) 11/20/2019   Weakness of foot (Left) 11/20/2019   Weakness of leg (Left) 11/20/2019   Foot drop (Left) 11/20/2019   Chronic hand pain (Left) 10/10/2019   MDD (major depressive disorder), recurrent, in full remission ([place]) 11/22/2018   Paroxysmal atrial fibrillation ([place]) 10/16/2018   Coronary artery disease involving native coronary artery of native heart without angina pectoris 10/15/2018   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   Pneumonia 10/06/2018   Immunosuppression (HCC) 09/18/2018   Pharmacologic therapy 07/11/2018   Disorder of skeletal system 07/11/2018   Problems influencing health status 07/11/2018   MDD (major depressive disorder), recurrent episode, mild ([place]) 07/09/2018   Primary insomnia 07/09/2018   Bereavement 07/09/2018   Body mass index (BMI) of 40.0-44.9 in adult ([place]) 10/09/2017   Postmenopausal 04/06/2017   Chronic arthralgias of knees and hips (Right) 08/18/2016   Arthritis 07/11/2016   Dyspnea on exertion 07/11/2016   Kidney disease 07/11/2016   Fibromyalgia syndrome 07/11/2016   Snoring 07/11/2016   Morbid obesity with BMI of 40.0-44.9, adult ([place]) 07/11/2016   Opioid-induced constipation ([place]) 04/21/2016   Osteoarthritis of knee (Right) 01/25/2016   Diet-controlled diabetes mellitus ([place]) 10/03/2015   GAD (generalized anxiety disorder) 09/24/2015   Disturbance of skin sensation 07/15/2015   Elevated sedimentation rate  07/15/2015   Elevated C-reactive protein (CRP) 07/15/2015   Chronic hip pain (Left) 04/06/2015   History of TKR (total knee replacement) (Left) 02/09/2015   History of femur fracture (Right) 02/09/2015   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) 02/09/2015   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) 02/09/2015   Lumbar foraminal stenosis (Bilateral L3-4 and L5-S1) 02/09/2015   Chronic low back pain (1ry area of Pain) (Bilateral) (L>R) w/ sciatica (Bilateral) 01/22/2015   Chronic pain syndrome 26/83/4196   Uncomplicated opioid dependence ([place]) 11/07/2014   Lumbar spinal stenosis (5 mm Severe L3-4; 8 mm L4-5) w/ neurogenic claudication 11/07/2014   Lumbar spondylosis 11/07/2014   Lumbar facet syndrome (Bilateral) (L>R) 11/07/2014   Chronic knee pain (Right) 11/05/2014   Opiate use (30 MME/Day) 11/05/2014   Long term current use of opiate analgesic 11/05/2014   Long term prescription opiate use 11/05/2014   Encounter for therapeutic drug level monitoring 11/05/2014   Morbid obesity ([place]) 10/27/2014   Extreme obesity ([place]) 10/07/2014   Type 2 diabetes mellitus ([place]) 03/19/2014   Depressive disorder 10/14/2013   Essential (primary) hypertension 10/14/2013   Insomnia secondary to chronic pain 07/09/2013   Anxiety 07/09/2013   GERD (gastroesophageal reflux disease) 01/18/2013   Hypothyroidism 01/18/2013   Hypercholesterolemia 01/18/2013   Seronegative rheumatoid  arthritis (HCC) 01/18/2013   CKD (chronic kidney disease) stage 3, GFR 30-59 ml/min (HCC) 01/17/2013   Congestive heart failure with left ventricular systolic dysfunction (HCC) 11/10/2012   PCP:  Lynnea Ferrier, MD Pharmacy:   [facility] [id number] [place], [place] - [address] [place] [address] Phone: [phone] Fax: [phone]     Social Determinants of Health (SDOH) Social History: SDOH Screenings   Food Insecurity: No Food Insecurity (02/01/2022)  Housing: Low Risk  (02/01/2022)   Transportation Needs: No Transportation Needs (02/01/2022)  Utilities: Not At Risk (02/01/2022)  Depression (PHQ2-9): Low Risk  (10/21/2021)  Financial Resource Strain: Low Risk  (11/15/2016)  Physical Activity: Inactive (11/15/2016)  Social Connections: Somewhat Isolated (11/15/2016)  Stress: No Stress Concern Present (11/15/2016)  Tobacco Use: Low Risk  (02/02/2022)   SDOH Interventions:     Readmission Risk Interventions     No data to display

## 2022-02-04 NOTE — Progress Notes (Signed)
Physical Therapy Treatment Patient Details Name: Laura [name] MRN: [id number] DOB: 01-24-1945 Today's Date: 02/04/2022   History of Present Illness Patient is a 77 year old female with progressively worsening back pain and fall on 1/11, difficulty mobilizing at home. Sacral insufficiency fracture, L5 transverse process fracture. History of chronic back pain. Pt is a 77 y/o female who had a fall earlier this month and has experienced progressive decline in her ambulatory status and severe pain to where she could not get out of bed.  CT scan showed a confirmed U-type sacral insufficiency fracture with bilateral zone 2 sacral fractures.  She is now s/p percutaneous fixation of bilateral sacral insufficiency fractures on 02/02/2022. PMH significant for anxiety, chronic L hand pain, CKD, lumbar spinal stenosis with neurogenic claudication, RA, sciatica, thyroid disease, L TKA, R hip surgery, L5 transverse process fracture.    PT Comments    Pt very anxious and reporting high levels of pain this date, states she feels depressed about how her pain is not yet better. Pt requiring max +2 assist for bed-level only mobility at this time, and declined attempt for EOB when asked. Pt tolerated LE exercises fairly, pt does better when cued not to guard. PT to continue to follow.      Recommendations for follow up therapy are one component of a multi-disciplinary discharge planning process, led by the attending physician.  Recommendations may be updated based on patient status, additional functional criteria and insurance authorization.  Follow Up Recommendations  Skilled nursing-short term rehab (<3 hours/day) Can patient physically be transported by private vehicle: No   Assistance Recommended at Discharge Frequent or constant Supervision/Assistance  Patient can return home with the following Two people to help with walking and/or transfers;Help with stairs or ramp for entrance;Assist for  transportation;Assistance with cooking/housework;A lot of help with bathing/dressing/bathroom   Equipment Recommendations  Other (comment) (to be determined at next level of care)    Recommendations for Other Services       Precautions / Restrictions Precautions Precautions: Fall;Back Precaution Booklet Issued: No Precaution Comments: Back precautions for comfort due to L5 TP fracture 01/20/21 Restrictions Weight Bearing Restrictions: Yes     Mobility  Bed Mobility Overal bed mobility: Needs Assistance Bed Mobility: Rolling Rolling: Max assist, +2 for physical assistance         General bed mobility comments: max +2 for rolling for pericare for trunk and LE management. Pt declines EOB or OOB attempt    Transfers                   General transfer comment: pt declines attempt    Ambulation/Gait                   Stairs             Wheelchair Mobility    Modified Rankin (Stroke Patients Only)       Balance Overall balance assessment: History of Falls                                          Cognition Arousal/Alertness: Awake/alert Behavior During Therapy: Anxious Overall Cognitive Status: Within Functional Limits for tasks assessed                                 General Comments: Pt anxious  and emotional, stating she feels depressed        Exercises General Exercises - Lower Extremity Ankle Circles/Pumps: Both, AAROM, 15 reps, Supine Quad Sets: AAROM, Both, 15 reps, Supine Gluteal Sets: AROM, Both, 15 reps, Supine Heel Slides: AAROM, Both, 15 reps, Supine Hip ABduction/ADduction: AAROM, Both, 15 reps, Supine    General Comments        Pertinent Vitals/Pain Pain Assessment Pain Assessment: Faces Faces Pain Scale: Hurts worst Pain Location: back and sacrum Pain Descriptors / Indicators: Grimacing, Guarding, Crying, Moaning Pain Intervention(s): Limited activity within patient's tolerance,  Monitored during session, Repositioned, Premedicated before session, RN gave pain meds during session    Home Living                          Prior Function            PT Goals (current goals can now be found in the care plan section) Acute Rehab PT Goals Patient Stated Goal: to have less pain PT Goal Formulation: With patient Time For Goal Achievement: 02/17/22 Potential to Achieve Goals: Fair Progress towards PT goals: Progressing toward goals    Frequency    Min 3X/week      PT Plan Current plan remains appropriate    Co-evaluation              AM-PAC PT "6 Clicks" Mobility   Outcome Measure  Help needed turning from your back to your side while in a flat bed without using bedrails?: Total Help needed moving from lying on your back to sitting on the side of a flat bed without using bedrails?: Total Help needed moving to and from a bed to a chair (including a wheelchair)?: Total Help needed standing up from a chair using your arms (e.g., wheelchair or bedside chair)?: Total Help needed to walk in hospital room?: Total Help needed climbing 3-5 steps with a railing? : Total 6 Click Score: 6    End of Session   Activity Tolerance: Patient limited by pain Patient left: in bed;with call bell/phone within reach;with bed alarm set;with SCD's reapplied Nurse Communication: Mobility status PT Visit Diagnosis: Pain;Muscle weakness (generalized) (M62.81) Pain - Right/Left:  (right and left) Pain - part of body:  (lower back/sacral area)     Time: 1405-1430 PT Time Calculation (min) (ACUTE ONLY): 25 min  Charges:  $Therapeutic Exercise: 8-22 mins $Therapeutic Activity: 8-22 mins                    Stacie Glaze, PT DPT Acute Rehabilitation Services Pager [phone]  Office [phone]    Roxine Caddy E Ruffin Pyo 02/04/2022, 3:20 PM

## 2022-02-04 NOTE — Progress Notes (Addendum)
Orthopaedic Trauma Progress Note  SUBJECTIVE: Still having pain in the pelvis and low back. Will increase Oxycodone to 5-10 mg q 4 hours for better pain control as her dose for chronic pain is 5 mg Was able to put her feet on the ground with therapies yesterday. No chest pain. No SOB. No nausea/vomiting. No other complaints.  OBJECTIVE:  Vitals:   02/03/22 2103 02/04/22 0736  BP: (!) 146/64 135/76  Pulse: 75 66  Resp: 18 16  Temp: 98.5 F (36.9 C) 98.4 F (36.9 C)  SpO2: (!) 88% 97%    General: Laying in bed Respiratory: No increased work of breathing.  Pelvis/BLE: Incision right hip CDI. Does have some tenderness over lumbar spine. Tolerated passive ankle DF/PF bilaterally. Unable to actively demonstrate this.  Severely diminished/nearly absent sensation to DPN/SPN/TN bilaterally.  This is unchanged from preop exam.  Swelling throughout extremities stable.  1+ DP pulse bilaterally  IMAGING: Stable post op imaging.   LABS:  Results for orders placed or performed during the hospital encounter of 02/02/22 (from the past 24 hour(s))  Glucose, capillary     Status: Abnormal   Collection Time: 02/03/22  4:10 PM  Result Value Ref Range   Glucose-Capillary 159 (H) 70 - 99 mg/dL  Glucose, capillary     Status: Abnormal   Collection Time: 02/03/22  9:04 PM  Result Value Ref Range   Glucose-Capillary 128 (H) 70 - 99 mg/dL  Glucose, capillary     Status: Abnormal   Collection Time: 02/04/22 12:09 AM  Result Value Ref Range   Glucose-Capillary 169 (H) 70 - 99 mg/dL  Glucose, capillary     Status: Abnormal   Collection Time: 02/04/22  4:01 AM  Result Value Ref Range   Glucose-Capillary 146 (H) 70 - 99 mg/dL  CBC     Status: Abnormal   Collection Time: 02/04/22  4:44 AM  Result Value Ref Range   WBC 11.4 (H) 4.0 - 10.5 K/uL   RBC 3.54 (L) 3.87 - 5.11 MIL/uL   Hemoglobin 10.4 (L) 12.0 - 15.0 g/dL   HCT 31.9 (L) 36.0 - 46.0 %   MCV 90.1 80.0 - 100.0 fL   MCH 29.4 26.0 - 34.0 pg    MCHC 32.6 30.0 - 36.0 g/dL   RDW 13.7 11.5 - 15.5 %   Platelets 373 150 - 400 K/uL   nRBC 0.0 0.0 - 0.2 %  Comprehensive metabolic panel     Status: Abnormal   Collection Time: 02/04/22  4:44 AM  Result Value Ref Range   Sodium 136 135 - 145 mmol/L   Potassium 4.0 3.5 - 5.1 mmol/L   Chloride 96 (L) 98 - 111 mmol/L   CO2 30 22 - 32 mmol/L   Glucose, Bld 141 (H) 70 - 99 mg/dL   BUN 31 (H) 8 - 23 mg/dL   Creatinine, Ser 1.10 (H) 0.44 - 1.00 mg/dL   Calcium 8.3 (L) 8.9 - 10.3 mg/dL   Total Protein 5.7 (L) 6.5 - 8.1 g/dL   Albumin 2.7 (L) 3.5 - 5.0 g/dL   AST 13 (L) 15 - 41 U/L   ALT 6 0 - 44 U/L   Alkaline Phosphatase 78 38 - 126 U/L   Total Bilirubin 0.5 0.3 - 1.2 mg/dL   GFR, Estimated 52 (L) >60 mL/min   Anion gap 10 5 - 15  Glucose, capillary     Status: Abnormal   Collection Time: 02/04/22  7:52 AM  Result Value Ref Range  Glucose-Capillary 124 (H) 70 - 99 mg/dL  Glucose, capillary     Status: Abnormal   Collection Time: 02/04/22 11:58 AM  Result Value Ref Range   Glucose-Capillary 190 (H) 70 - 99 mg/dL    ASSESSMENT: Laura [name] is a 77 y.o. female s/p fall with nondisplaced bilateral sacral fractures, 2 Days Post-Op s/p ORIF PELVIC FRACTURE WITH PERCUTANEOUS SCREWS  CV/Blood loss: Acute blood loss anemia, Hgb 10.4 this morning. Hemodynamically stable  PLAN: Weightbearing: WBAT RLE and LLE ROM: Okay for unrestricted range of motion of the hip and knees as tolerated Incisional and dressing care: Ok to leave incisions open to air Showering: Okay to begin showering and getting incisions wet 02/05/2022 Orthopedic device(s): None  Pain management:  1. Tylenol 1000 mg q 6 hours scheduled 2. Oxycodone 5-10 mg q 4 hours PRN 4. Dilaudid 0.5-1 mg q 4 hours PRN VTE prophylaxis: Lovenox, SCDs ID:  Ancef 2gm post op completed Foley/Lines:  No foley, KVO IVFs Medical comorbidities: Newly diagnosed DM, hypothyroidism, RA on immunosuppressants, paroxysmal A-fib, anxiety,  depression, chronic pain, left L5 transverse process fracture with known lumbar stenosis Impediments to Fracture Healing: DM.  Vitamin D level 21.2, start on supplementation  Dispo: PT/OT evaluation ongoing, recommending SNF currently. Ok for d/c from ortho standpoint once cleared by therapies and medicine team  D/C recommendations: -Oxycodone 5-10 mg q 4 hours for pain control -Aspirin 325 mg daily x 30 days for DVT prophylaxis -50,000 units weekly Vit D supplementation  Follow - up plan: 2 weeks after discharge for wound check and repeat x-rays   Contact information:  Katha Hamming MD, Rushie Nyhan PA-C. After hours and holidays please check [email or web address] for group call information for [facility]   Gwinda Passe, PA-C [phone] (office) [email or web address]

## 2022-02-04 NOTE — Care Management Important Message (Signed)
Important Message  Patient Details  Name: Laura [name] MRN: [id number] Date of Birth: Mar 18, 1945   Medicare Important Message Given:  Yes     Hannah Beat 02/04/2022, 2:03 PM

## 2022-02-04 NOTE — NC FL2 (Signed)
[place] LEVEL OF CARE FORM     IDENTIFICATION  Patient Name: Laura [name] Birthdate: [birth year] Sex: female Admission Date (Current Location): 02/02/2022  [facility] and [place] Number:  Herbalist and Address:  [facility], [place] [address], [place], [place] [address]      Provider Number: [id number]  Attending Physician Name and Address:  Donne Hazel, MD  Relative Name and Phone Number:  Abria, Vannostrand) Daughter   [phone]    Current Level of Care: Hospital Recommended Level of Care: [place] Prior Approval Number:    Date Approved/Denied:   PASRR Number: [id number]  Discharge Plan: SNF    Current Diagnoses: Patient Active Problem List   Diagnosis Date Noted   Displaced fracture of right femoral neck ([place]) 02/02/2022   Midline low back pain 02/01/2022   Sacral insufficiency fracture 02/01/2022   Fracture of transverse process of lumbar vertebra, closed, initial encounter ([place]) 02/01/2022   Chronic back pain 02/01/2022   Ambulatory dysfunction 02/01/2022   Fall at home, initial encounter 02/01/2022   Spinal stenosis at L4-L5 level 02/01/2022   Lymphedema 04/26/2021   Generalized osteoarthritis of multiple sites 12/09/2020   Chronic low back pain (1ry area of Pain) (Bilateral) (L>R) w/o sciatica 12/09/2020   Ligamentum flavum hypertrophy (L1-2, L2-3, L3-4) 05/28/2020   Lumbar lateral recess stenosis (Bilateral) (L2-3, L3-4, L4-5) (Severe: L3-4) 05/28/2020   Lumbar Grade 1 Anterolisthesis of L3/L4 and L4/L5 05/28/2020   Intractable back pain 05/28/2020   Chronic use of opiate for therapeutic purpose 04/14/2020   Statin myopathy 03/20/2020   DDD (degenerative disc disease), lumbosacral 03/10/2020   Abnormal MRI, lumbar spine (04/30/2021) 02/19/2020   Chronic lower extremity pain (2ry area of Pain) (Bilateral) 02/04/2020   Lumbosacral radiculopathy at L5 (Left) 02/04/2020    Chronic anticoagulation ([place]) 01/20/2020   Cerebrovascular accident (CVA) ([place]) 01/02/2020   Sciatica (Left) 12/31/2019   Dropfoot (Left) 11/20/2019   Weakness of foot (Left) 11/20/2019   Weakness of leg (Left) 11/20/2019   Foot drop (Left) 11/20/2019   Chronic hand pain (Left) 10/10/2019   MDD (major depressive disorder), recurrent, in full remission ([place]) 11/22/2018   Paroxysmal atrial fibrillation ([place]) 10/16/2018   Coronary artery disease involving native coronary artery of native heart without angina pectoris 10/15/2018   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   Pneumonia 10/06/2018   Immunosuppression ([place]) 09/18/2018   Pharmacologic therapy 07/11/2018   Disorder of skeletal system 07/11/2018   Problems influencing health status 07/11/2018   MDD (major depressive disorder), recurrent episode, mild ([place]) 07/09/2018   Primary insomnia 07/09/2018   Bereavement 07/09/2018   Body mass index (BMI) of 40.0-44.9 in adult ([place]) 10/09/2017   Postmenopausal 04/06/2017   Chronic arthralgias of knees and hips (Right) 08/18/2016   Arthritis 07/11/2016   Dyspnea on exertion 07/11/2016   Kidney disease 07/11/2016   Fibromyalgia syndrome 07/11/2016   Snoring 07/11/2016   Morbid obesity with BMI of 40.0-44.9, adult ([place]) 07/11/2016   Opioid-induced constipation ([place]IC) 04/21/2016   Osteoarthritis of knee (Right) 01/25/2016   Diet-controlled diabetes mellitus ([place]) 10/03/2015   GAD (generalized anxiety disorder) 09/24/2015   Disturbance of skin sensation 07/15/2015   Elevated sedimentation rate 07/15/2015   Elevated C-reactive protein (CRP) 07/15/2015   Chronic hip pain (Left) 04/06/2015   History of TKR (total knee replacement) (Left) 02/09/2015   History of femur fracture (Right) 02/09/2015   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) 02/09/2015   Osteoarthritis of  hips (Bilateral) (L>R) 02/09/2015   Lumbar foraminal stenosis (Bilateral L3-4 and L5-S1) 02/09/2015    Chronic low back pain (1ry area of Pain) (Bilateral) (L>R) w/ sciatica (Bilateral) 01/22/2015   Chronic pain syndrome 11/25/2014   Uncomplicated opioid dependence (HCC) 11/07/2014   Lumbar spinal stenosis (5 mm Severe L3-4; 8 mm L4-5) w/ neurogenic claudication 11/07/2014   Lumbar spondylosis 11/07/2014   Lumbar facet syndrome (Bilateral) (L>R) 11/07/2014   Chronic knee pain (Right) 11/05/2014   Opiate use (30 MME/Day) 11/05/2014   Long term current use of opiate analgesic 11/05/2014   Long term prescription opiate use 11/05/2014   Encounter for therapeutic drug level monitoring 11/05/2014   Morbid obesity (HCC) 10/27/2014   Extreme obesity (HCC) 10/07/2014   Type 2 diabetes mellitus (HCC) 03/19/2014   Depressive disorder 10/14/2013   Essential (primary) hypertension 10/14/2013   Insomnia secondary to chronic pain 07/09/2013   Anxiety 07/09/2013   GERD (gastroesophageal reflux disease) 01/18/2013   Hypothyroidism 01/18/2013   Hypercholesterolemia 01/18/2013   Seronegative rheumatoid arthritis (HCC) 01/18/2013   CKD (chronic kidney disease) stage 3, GFR 30-59 ml/min (HCC) 01/17/2013   Congestive heart failure with left ventricular systolic dysfunction (HCC) 11/10/2012    Orientation RESPIRATION BLADDER Height & Weight     Self, Time, Situation, Place    Incontinent Weight:   Height:     BEHAVIORAL SYMPTOMS/MOOD NEUROLOGICAL BOWEL NUTRITION STATUS      Continent Diet (see discharge summary)  AMBULATORY STATUS COMMUNICATION OF NEEDS Skin   Total Care Verbally Surgical wounds                       Personal Care Assistance Level of Assistance  Bathing, Feeding, Dressing, Total care Bathing Assistance: Maximum assistance Feeding assistance: Limited assistance Dressing Assistance: Maximum assistance Total Care Assistance: Maximum assistance   Functional Limitations Info  Sight, Hearing, Speech Sight Info: Impaired Hearing Info: Impaired Speech Info: Adequate     SPECIAL CARE FACTORS FREQUENCY  PT (By licensed PT), OT (By licensed OT)     PT Frequency: 5x week OT Frequency: 5x week            Contractures Contractures Info: Not present    Additional Factors Info  Code Status, Allergies, Insulin Sliding Scale Code Status Info: full Allergies Info: Bupropion, Jardiance (Empagliflozin), Penicillins   Insulin Sliding Scale Info: Novolog: see discharge summary       Current Medications (02/04/2022):  This is the current hospital active medication list Current Facility-Administered Medications  Medication Dose Route Frequency Provider Last Rate Last Admin   acetaminophen (TYLENOL) tablet 1,000 mg  1,000 mg Oral Q6H McClung, Sarah A, PA-C   1,000 mg at 02/04/22 0624   albuterol (PROVENTIL) (2.5 MG/3ML) 0.083% nebulizer solution 3 mL  3 mL Inhalation Q6H PRN [place] Bali, PA-C       aspirin EC tablet 81 mg  81 mg Oral Daily [place] Bali, PA-C   81 mg at 02/04/22 0846   Chlorhexidine Gluconate Cloth 2 % PADS 6 each  6 each Topical Q0600 Jerald Kief, MD   6 each at 02/04/22 7425   docusate sodium (COLACE) capsule 100 mg  100 mg Oral Q M,W,F [place] Bali, PA-C   100 mg at 02/04/22 0840   DULoxetine (CYMBALTA) DR capsule 60 mg  60 mg Oral Daily Thyra Breed A, PA-C   60 mg at 02/04/22 0840   [START ON 02/05/2022] enoxaparin (LOVENOX) injection 55 mg  0.5 mg/kg  Subcutaneous Q24H Donne Hazel, MD       HYDROmorphone (DILAUDID) injection 0.5-1 mg  0.5-1 mg Intravenous Q4H PRN Corinne Ports, PA-C   1 mg at 02/04/22 9242   hydroxychloroquine (PLAQUENIL) tablet 200 mg  200 mg Oral Daily Corinne Ports, PA-C   200 mg at 02/04/22 0841   insulin aspart (novoLOG) injection 0-9 Units  0-9 Units Subcutaneous Q4H Corinne Ports, PA-C   1 Units at 02/04/22 0845   levothyroxine (SYNTHROID) tablet 250 mcg  250 mcg Oral [id number] Corinne Ports, PA-C   250 mcg at 02/04/22 9622   metoCLOPramide (REGLAN) tablet 5-10 mg  5-10 mg Oral Q8H PRN  Corinne Ports, PA-C       Or   metoCLOPramide (REGLAN) injection 5-10 mg  5-10 mg Intravenous Q8H PRN Corinne Ports, PA-C       metoprolol succinate (TOPROL-XL) 24 hr tablet 100 mg  100 mg Oral Daily Rushie Nyhan A, PA-C   100 mg at 02/04/22 2979   mupirocin ointment (BACTROBAN) 2 % 1 Application  1 Application Nasal BID Donne Hazel, MD   1 Application at 89/21/19 0842   ondansetron (ZOFRAN) tablet 4 mg  4 mg Oral Q6H PRN Corinne Ports, PA-C       Or   ondansetron (ZOFRAN) injection 4 mg  4 mg Intravenous Q6H PRN Rushie Nyhan A, PA-C       oxyCODONE (Oxy IR/ROXICODONE) immediate release tablet 5 mg  5 mg Oral Q4H PRN Corinne Ports, PA-C   5 mg at 02/04/22 0847   phenol (CHLORASEPTIC) mouth spray 1 spray  1 spray Mouth/Throat PRN Donne Hazel, MD       polyethylene glycol (MIRALAX / GLYCOLAX) packet 17 g  17 g Oral Daily PRN Rushie Nyhan A, PA-C       rosuvastatin (CRESTOR) tablet 5 mg  5 mg Oral Once per day on Sun Fri Corinne Ports, PA-C   5 mg at 02/04/22 4174     Discharge Medications: Please see discharge summary for a list of discharge medications.  Relevant Imaging Results:  Relevant Lab Results:   Additional Information ss [id number], pt is vaccinated for covid with boosters.  Joanne Chars, LCSW

## 2022-02-04 NOTE — Discharge Instructions (Signed)
Orthopaedic Trauma Service Discharge Instructions   General Discharge Instructions  WEIGHT BEARING STATUS:weightbearing as tolerated  RANGE OF MOTION/ACTIVITY: ok for ROM as tolerated  Wound Care: You may remove your surgical dressing. Incisions can be left open to air if there is no drainage. Once the incision is completely dry and without drainage, it may be left open to air out.  Showering may begin starting on post-op day #3.  Clean incision gently with soap and water.  DVT/PE prophylaxis: Aspirin 325 mg daily x 30 days  Diet: as you were eating previously.  Can use over the counter stool softeners and bowel preparations, such as Miralax, to help with bowel movements.  Narcotics can be constipating.  Be sure to drink plenty of fluids  PAIN MEDICATION USE AND EXPECTATIONS  You have likely been given narcotic medications to help control your pain.  After a traumatic event that results in an fracture (broken bone) with or without surgery, it is ok to use narcotic pain medications to help control one's pain.  We understand that everyone responds to pain differently and each individual patient will be evaluated on a regular basis for the continued need for narcotic medications. Ideally, narcotic medication use should last no more than 6-8 weeks (coinciding with fracture healing).   As a patient it is your responsibility as well to monitor narcotic medication use and report the amount and frequency you use these medications when you come to your office visit.   We would also advise that if you are using narcotic medications, you should take a dose prior to therapy to maximize you participation.  IF YOU ARE ON NARCOTIC MEDICATIONS IT IS NOT PERMISSIBLE TO OPERATE A MOTOR VEHICLE (MOTORCYCLE/CAR/TRUCK/MOPED) OR HEAVY MACHINERY DO NOT MIX NARCOTICS WITH OTHER CNS (CENTRAL NERVOUS SYSTEM) DEPRESSANTS SUCH AS ALCOHOL   STOP SMOKING OR USING NICOTINE PRODUCTS!!!!  As discussed nicotine severely  impairs your body's ability to heal surgical and traumatic wounds but also impairs bone healing.  Wounds and bone heal by forming microscopic blood vessels (angiogenesis) and nicotine is a vasoconstrictor (essentially, shrinks blood vessels).  Therefore, if vasoconstriction occurs to these microscopic blood vessels they essentially disappear and are unable to deliver necessary nutrients to the healing tissue.  This is one modifiable factor that you can do to dramatically increase your chances of healing your injury.    (This means no smoking, no nicotine gum, patches, etc)  DO NOT USE NONSTEROIDAL ANTI-INFLAMMATORY DRUGS (NSAID'S)  Using products such as Advil (ibuprofen), Aleve (naproxen), Motrin (ibuprofen) for additional pain control during fracture healing can delay and/or prevent the healing response.  If you would like to take over the counter (OTC) medication, Tylenol (acetaminophen) is ok.  However, some narcotic medications that are given for pain control contain acetaminophen as well. Therefore, you should not exceed more than 4000 mg of tylenol in a day if you do not have liver disease.  Also note that there are may OTC medicines, such as cold medicines and allergy medicines that my contain tylenol as well.  If you have any questions about medications and/or interactions please ask your doctor/PA or your pharmacist.      ICE AND ELEVATE INJURED/OPERATIVE EXTREMITY  Using ice and elevating the injured extremity above your heart can help with swelling and pain control.  Icing in a pulsatile fashion, such as 20 minutes on and 20 minutes off, can be followed.    Do not place ice directly on skin. Make sure there is  a barrier between to skin and the ice pack.    Using frozen items such as frozen peas works well as the conform nicely to the are that needs to be iced.  USE AN ACE WRAP OR TED HOSE FOR SWELLING CONTROL  In addition to icing and elevation, Ace wraps or TED hose are used to help limit  and resolve swelling.  It is recommended to use Ace wraps or TED hose until you are informed to stop.    When using Ace Wraps start the wrapping distally (farthest away from the body) and wrap proximally (closer to the body)   Example: If you had surgery on your leg or thing and you do not have a splint on, start the ace wrap at the toes and work your way up to the thigh        If you had surgery on your upper extremity and do not have a splint on, start the ace wrap at your fingers and work your way up to the upper arm  IF YOU ARE IN A SPLINT OR CAST DO NOT REMOVE IT FOR ANY REASON   If your splint gets wet for any reason please contact the office immediately. You may shower in your splint or cast as long as you keep it dry.  This can be done by wrapping in a cast cover or garbage back (or similar)  Do Not stick any thing down your splint or cast such as pencils, money, or hangers to try and scratch yourself with.  If you feel itchy take benadryl as prescribed on the bottle for itching  IF YOU ARE IN A CAM BOOT (BLACK BOOT)  You may remove boot periodically. Perform daily dressing changes as noted below.  Wash the liner of the boot regularly and wear a sock when wearing the boot. It is recommended that you sleep in the boot until told otherwise   CALL THE OFFICE WITH ANY QUESTIONS OR CONCERNS: [phone]   VISIT OUR WEBSITE FOR ADDITIONAL INFORMATION: [email or web address]     Discharge Pin Site Instructions  Dress pins daily with Kerlix roll starting on POD 2. Wrap the Kerlix so that it tamps the skin down around the pin-skin interface to prevent/limit motion of the skin relative to the pin.  (Pin-skin motion is the primary cause of pain and infection related to external fixator pin sites).  Remove any crust or coagulum that may obstruct drainage with a saline moistened gauze or soap and water.  After POD 3, if there is no discernable drainage on the pin site dressing, the interval for  change can by increased to every other day.  You may shower with the fixator, cleaning all pin sites gently with soap and water.  If you have a surgical wound this needs to be completely dry and without drainage before showering.  The extremity can be lifted by the fixator to facilitate wound care and transfers.  Notify the office/Doctor if you experience increasing drainage, redness, or pain from a pin site, or if you notice purulent (thick, snot-like) drainage.  Discharge Wound Care Instructions  Do NOT apply any ointments, solutions or lotions to pin sites or surgical wounds.  These prevent needed drainage and even though solutions like hydrogen peroxide kill bacteria, they also damage cells lining the pin sites that help fight infection.  Applying lotions or ointments can keep the wounds moist and can cause them to breakdown and open up as well. This can increase the risk for  infection. When in doubt call the office.  Surgical incisions should be dressed daily.  If any drainage is noted, use one layer of adaptic or Mepitel, then gauze, Kerlix, and an ace wrap. - These dressing supplies should be available at local medical supply stores [facility], [facility], etc) as well as Management consultant ([employer], [employer], [employer], etc)  Once the incision is completely dry and without drainage, it may be left open to air out.  Showering may begin 36-48 hours later.  Cleaning gently with soap and water.  Traumatic wounds should be dressed daily as well.    One layer of adaptic, gauze, Kerlix, then ace wrap.  The adaptic can be discontinued once the draining has ceased    If you have a wet to dry dressing: wet the gauze with saline the squeeze as much saline out so the gauze is moist (not soaking wet), place moistened gauze over wound, then place a dry gauze over the moist one, followed by Kerlix wrap, then ace wrap.

## 2022-02-05 DIAGNOSIS — G8929 Other chronic pain: Secondary | ICD-10-CM | POA: Diagnosis not present

## 2022-02-05 DIAGNOSIS — M25561 Pain in right knee: Secondary | ICD-10-CM

## 2022-02-05 DIAGNOSIS — S72001A Fracture of unspecified part of neck of right femur, initial encounter for closed fracture: Secondary | ICD-10-CM | POA: Diagnosis not present

## 2022-02-05 LAB — GLUCOSE, CAPILLARY
Glucose-Capillary: 143 mg/dL — ABNORMAL HIGH (ref 70–99)
Glucose-Capillary: 120 mg/dL — ABNORMAL HIGH (ref 70–99)
Glucose-Capillary: 127 mg/dL — ABNORMAL HIGH (ref 70–99)
Glucose-Capillary: 173 mg/dL — ABNORMAL HIGH (ref 70–99)
Glucose-Capillary: 143 mg/dL — ABNORMAL HIGH (ref 70–99)
Glucose-Capillary: 173 mg/dL — ABNORMAL HIGH (ref 70–99)

## 2022-02-05 MED ORDER — ENOXAPARIN SODIUM 60 MG/0.6ML IJ SOSY
50.0000 mg | PREFILLED_SYRINGE | INTRAMUSCULAR | Status: DC
  Administered 2022-02-06 – 2022-02-07 (×2): 50 mg via SUBCUTANEOUS
  Filled 2022-02-05 (×2): qty 0.6

## 2022-02-05 NOTE — Progress Notes (Signed)
  Progress Note   Patient: Laura [name] QQI:[id number] DOB: Jan 29, 1945 DOA: 02/02/2022     3 DOS: the patient was seen and examined on 02/05/2022   Brief hospital course: 77 y.o. female with k/h/o HTN, hypothyroidism, RA on immunosupressants, Paroxysmal a.fib not on anticoagulation, chronic back pain on opiates, anxiety, depression,  left L5 transverse process fracture s/p fall with known lumbar stenosis.  She has neurogenic claudication from that. She has an acute sacral fracture consistent with a sacral insufficiency fracture, Neurosurgery Dr Izora Ribas reviewed the films with Dr Haddix/orthopedic trauma attendings at [facility] who felt that she may benefit from stabilization given that she is having significant difficulty with ambulation due to pain.  Pelvic stabilization is not offered at this facility, so transfer will be necessary for higher level of care where orthopedic trauma specialists are more available   Assessment and Plan: Sacral insufficiency fracture L5 transverse process fracture secondary to mechanical fall Intractable back pain with ambulatory dysfunction Chronic back pain on chronic opiates Chronic physical deconditioning -now s/p fixation of B sacral insufficiency fractures 1/24 -f/u with PT/OT recs which were for SNF -this AM, pt felt unsure about going to SNF and thought she would rather just go home. Pt is alert and oriented, has insight to care, thus able to make medical decisions -TOC following to address dispo. Pending   Paroxysmal atrial fibrillation (HCC) Continue metoprolol and aspirin Not on systemic anticoagulation as paroxysmal A-fib was in the setting of acute infection Remains rate controlled   Coronary artery disease involving native coronary artery of native heart without angina pectoris Continue aspirin, rosuvastatin and metoprolol  -Seems stable   GAD (generalized anxiety disorder) Continue home duloxetine, hydroxyzine -Per pharmacy, doxepin has  been ineffective for pt historically   Seronegative rheumatoid arthritis ([place]) On methotrexate and Remicade and Plaquenil   Type 2 diabetes mellitus ([place]) Sliding scale insulin coverage Seen by diabetic coordinator Glycemic trends stable   Congestive heart failure with left ventricular systolic dysfunction (HCC) Continue Lasix and metoprolol   Hypothyroidism Continue levothyroxine as tolerated  Constipation Continue with bowel regimen as needed      Subjective: Feeling better after BM yesterday  Physical Exam: Vitals:   02/04/22 1500 02/04/22 2117 02/05/22 0459 02/05/22 0839  BP: 108/77 117/82 (!) 157/92 (!) 126/57  Pulse: 66 64 71 81  Resp: 17 20 18 16   Temp: 97.7 F (36.5 C) 98 F (36.7 C) 98.6 F (37 C)   TempSrc: Oral Oral Oral   SpO2: 95% 95% 97% 100%   General exam: Awake, laying in bed, in nad Respiratory system: Normal respiratory effort, no wheezing Cardiovascular system: regular rate, s1, s2 Gastrointestinal system: Soft, nondistended, positive BS Central nervous system: CN2-12 grossly intact, strength intact Extremities: Perfused, no clubbing Skin: Normal skin turgor, no notable skin lesions seen Psychiatry: Mood normal // no visual hallucinations   Data Reviewed:  There are no new results to review at this time.  Family Communication: Pt in room, family not at bedside  Disposition: Status is: Inpatient Remains inpatient appropriate because: Severity of illness  Planned Discharge Destination: Skilled nursing facility    Author: Marylu Lund, MD 02/05/2022 3:41 PM  For on call review [email or web address].

## 2022-02-05 NOTE — TOC Progression Note (Signed)
Transition of Care [facility]) - Progression Note    Patient Details  Name: Laura [name] MRN: [id number] Date of Birth: 02-18-1945  Transition of Care [facility]) CM/SW Contact  Bartholomew Crews, RN Phone Number: [phone] 02/05/2022, 3:54 PM  Clinical Narrative:     Received call from patient's daughter, Kathlee Nations, advising RNCM that she and patient's spouse and son in law were on the way to hospital to discuss transitioning to SNF before coming home. They would like to accept offer for [facility] - CSW aware. TOC following.   Expected Discharge Plan: [place] Barriers to Discharge: Continued Medical Work up, SNF Pending bed offer  Expected Discharge Plan and Services In-house Referral: Clinical Social Work   Post Acute Care Choice: [place] Living arrangements for the past 2 months: [place] Determinants of Health (SDOH) Interventions [place]: No Food Insecurity (02/01/2022)  Housing: Low Risk  (02/01/2022)  Transportation Needs: No Transportation Needs (02/01/2022)  Utilities: Not At Risk (02/01/2022)  Depression (PHQ2-9): Low Risk  (10/21/2021)  Financial Resource Strain: Low Risk  (11/15/2016)  Physical Activity: Inactive (11/15/2016)  Social Connections: Somewhat Isolated (11/15/2016)  Stress: No Stress Concern Present (11/15/2016)  Tobacco Use: Low Risk  (02/04/2022)    Readmission Risk Interventions     No data to display

## 2022-02-05 NOTE — TOC Progression Note (Signed)
Transition of Care [facility]) - Progression Note    Patient Details  Name: Laura [name] MRN: [id number] Date of Birth: 08-Oct-1945  Transition of Care [facility]) CM/SW Contact  Bartholomew Crews, RN Phone Number: [phone] 02/05/2022, 1:57 PM  Clinical Narrative:     Attempted to speak with patient at the bedside to discuss post acute transition, however, patient appeared to be sleeping soundly. Spoke with patient's daughter, Kathlee Nations. Kathlee Nations is aware that patient doesn't not want to go to SNF, but spouse is unable to care for patient at home unless she is able to get out of bed. Discussed pain management has been a barrier to discharge and participation with therapy - Advised that patient has transitioned to PO pain medications for >24 hours. Kathlee Nations expressed concerns that patient may feel that if she goes to SNF that she will be there forever and mentally may give up. Kathlee Nations to discuss with patient's spouse and make decision. Kathlee Nations also concerned about getting patient to f/u medical appointments. Discussed bed offers - Kathlee Nations is interested in [facility], but will f/u with [facility] for firm decision later today. TOC following for transition needs.   Expected Discharge Plan: [place] Barriers to Discharge: Continued Medical Work up, SNF Pending bed offer  Expected Discharge Plan and Services In-house Referral: Clinical Social Work   Post Acute Care Choice: [place] Living arrangements for the past 2 months: [place] Determinants of Health (SDOH) Interventions [place]: No Food Insecurity (02/01/2022)  Housing: Low Risk  (02/01/2022)  Transportation Needs: No Transportation Needs (02/01/2022)  Utilities: Not At Risk (02/01/2022)  Depression (PHQ2-9): Low Risk  (10/21/2021)  Financial Resource Strain: Low Risk  (11/15/2016)  Physical Activity: Inactive (11/15/2016)  Social Connections:  Somewhat Isolated (11/15/2016)  Stress: No Stress Concern Present (11/15/2016)  Tobacco Use: Low Risk  (02/04/2022)    Readmission Risk Interventions     No data to display

## 2022-02-05 NOTE — TOC Progression Note (Signed)
Transition of Care [facility]) - Progression Note    Patient Details  Name: Laura [name] MRN: [id number] Date of Birth: [birth year]  [facility]) CM/SW Contact  Emeterio Reeve, [place] Phone Number: 02/05/2022, 1:20 PM  Clinical Narrative:     CSW met with pt at bedside. CSW gave pt her 2 bed offers. Pt states she now wants to go home. Pt states she lives at home with her husband and she will "figure it out". Pt is interested in [facility], CSW informed NCM.    Expected Discharge Plan: [place] Barriers to Discharge: Continued Medical Work up, SNF Pending bed offer  Expected Discharge Plan and Services In-house Referral: Clinical Social Work   Post Acute Care Choice: [place] Living arrangements for the past 2 months: [place] Determinants of Health (SDOH) Interventions [place]: No Food Insecurity (02/01/2022)  Housing: Low Risk  (02/01/2022)  Transportation Needs: No Transportation Needs (02/01/2022)  Utilities: Not At Risk (02/01/2022)  Depression (PHQ2-9): Low Risk  (10/21/2021)  Financial Resource Strain: Low Risk  (11/15/2016)  Physical Activity: Inactive (11/15/2016)  Social Connections: Somewhat Isolated (11/15/2016)  Stress: No Stress Concern Present (11/15/2016)  Tobacco Use: Low Risk  (02/04/2022)    Readmission Risk Interventions     No data to display         Emeterio Reeve, LCSW Clinical Social Worker

## 2022-02-06 DIAGNOSIS — G8929 Other chronic pain: Secondary | ICD-10-CM | POA: Diagnosis not present

## 2022-02-06 DIAGNOSIS — M25561 Pain in right knee: Secondary | ICD-10-CM | POA: Diagnosis not present

## 2022-02-06 DIAGNOSIS — S72001A Fracture of unspecified part of neck of right femur, initial encounter for closed fracture: Secondary | ICD-10-CM | POA: Diagnosis not present

## 2022-02-06 LAB — GLUCOSE, CAPILLARY
Glucose-Capillary: 109 mg/dL — ABNORMAL HIGH (ref 70–99)
Glucose-Capillary: 121 mg/dL — ABNORMAL HIGH (ref 70–99)
Glucose-Capillary: 133 mg/dL — ABNORMAL HIGH (ref 70–99)
Glucose-Capillary: 155 mg/dL — ABNORMAL HIGH (ref 70–99)
Glucose-Capillary: 173 mg/dL — ABNORMAL HIGH (ref 70–99)
Glucose-Capillary: 176 mg/dL — ABNORMAL HIGH (ref 70–99)
Glucose-Capillary: 186 mg/dL — ABNORMAL HIGH (ref 70–99)

## 2022-02-06 NOTE — Plan of Care (Signed)
Problem: Education: Goal: Knowledge of General Education information will improve Description: Including pain rating scale, medication(s)/side effects and non-pharmacologic comfort measures Outcome: Progressing   Problem: Health Behavior/Discharge Planning: Goal: Ability to manage health-related needs will improve Outcome: Progressing   Problem: Clinical Measurements: Goal: Ability to maintain clinical measurements within normal limits will improve Outcome: Progressing Goal: Will remain free from infection Outcome: Progressing Goal: Diagnostic test results will improve Outcome: Progressing Goal: Respiratory complications will improve Outcome: Progressing Goal: Cardiovascular complication will be avoided Outcome: Progressing   Problem: Activity: Goal: Risk for activity intolerance will decrease Outcome: Progressing   Problem: Nutrition: Goal: Adequate nutrition will be maintained Outcome: Progressing   Problem: Coping: Goal: Level of anxiety will decrease Outcome: Progressing   Problem: Elimination: Goal: Will not experience complications related to bowel motility Outcome: Progressing Goal: Will not experience complications related to urinary retention Outcome: Progressing   Problem: Pain Managment: Goal: General experience of comfort will improve Outcome: Progressing   Problem: Safety: Goal: Ability to remain free from injury will improve Outcome: Progressing   Problem: Skin Integrity: Goal: Risk for impaired skin integrity will decrease Outcome: Progressing   Problem: Education: Goal: Ability to describe self-care measures that may prevent or decrease complications (Diabetes Survival Skills Education) will improve Outcome: Progressing Goal: Individualized Educational Video(s) Outcome: Progressing   Problem: Coping: Goal: Ability to adjust to condition or change in health will improve Outcome: Progressing   Problem: Fluid Volume: Goal: Ability to  maintain a balanced intake and output will improve Outcome: Progressing   Problem: Health Behavior/Discharge Planning: Goal: Ability to identify and utilize available resources and services will improve Outcome: Progressing Goal: Ability to manage health-related needs will improve Outcome: Progressing   Problem: Metabolic: Goal: Ability to maintain appropriate glucose levels will improve Outcome: Progressing   Problem: Nutritional: Goal: Maintenance of adequate nutrition will improve Outcome: Progressing Goal: Progress toward achieving an optimal weight will improve Outcome: Progressing   Problem: Skin Integrity: Goal: Risk for impaired skin integrity will decrease Outcome: Progressing   Problem: Tissue Perfusion: Goal: Adequacy of tissue perfusion will improve Outcome: Progressing   Problem: Education: Goal: Knowledge of General Education information will improve Description: Including pain rating scale, medication(s)/side effects and non-pharmacologic comfort measures Outcome: Progressing   Problem: Health Behavior/Discharge Planning: Goal: Ability to manage health-related needs will improve Outcome: Progressing   Problem: Clinical Measurements: Goal: Ability to maintain clinical measurements within normal limits will improve Outcome: Progressing Goal: Will remain free from infection Outcome: Progressing Goal: Diagnostic test results will improve Outcome: Progressing Goal: Respiratory complications will improve Outcome: Progressing Goal: Cardiovascular complication will be avoided Outcome: Progressing   Problem: Activity: Goal: Risk for activity intolerance will decrease Outcome: Progressing   Problem: Nutrition: Goal: Adequate nutrition will be maintained Outcome: Progressing   Problem: Coping: Goal: Level of anxiety will decrease Outcome: Progressing   Problem: Elimination: Goal: Will not experience complications related to bowel motility Outcome:  Progressing Goal: Will not experience complications related to urinary retention Outcome: Progressing   Problem: Pain Managment: Goal: General experience of comfort will improve Outcome: Progressing   Problem: Safety: Goal: Ability to remain free from injury will improve Outcome: Progressing   Problem: Skin Integrity: Goal: Risk for impaired skin integrity will decrease Outcome: Progressing   Problem: Education: Goal: Ability to describe self-care measures that may prevent or decrease complications (Diabetes Survival Skills Education) will improve Outcome: Progressing Goal: Individualized Educational Video(s) Outcome: Progressing   Problem: Coping: Goal: Ability to adjust to condition or change   in health will improve Outcome: Progressing   Problem: Fluid Volume: Goal: Ability to maintain a balanced intake and output will improve Outcome: Progressing   Problem: Health Behavior/Discharge Planning: Goal: Ability to identify and utilize available resources and services will improve Outcome: Progressing Goal: Ability to manage health-related needs will improve Outcome: Progressing   Problem: Metabolic: Goal: Ability to maintain appropriate glucose levels will improve Outcome: Progressing   Problem: Nutritional: Goal: Maintenance of adequate nutrition will improve Outcome: Progressing Goal: Progress toward achieving an optimal weight will improve Outcome: Progressing   Problem: Skin Integrity: Goal: Risk for impaired skin integrity will decrease Outcome: Progressing   Problem: Tissue Perfusion: Goal: Adequacy of tissue perfusion will improve Outcome: Progressing   

## 2022-02-06 NOTE — Progress Notes (Signed)
  Progress Note   Patient: Laura [name] EXB:[id number] DOB: [birth year] DOA: 02/02/2022     4 DOS: the patient was seen and examined on 02/06/2022   Brief hospital course: 77 y.o. female with k/h/o HTN, hypothyroidism, RA on immunosupressants, Paroxysmal a.fib not on anticoagulation, chronic back pain on opiates, anxiety, depression,  left L5 transverse process fracture s/p fall with known lumbar stenosis.  She has neurogenic claudication from that. She has an acute sacral fracture consistent with a sacral insufficiency fracture, Neurosurgery Dr Izora Ribas reviewed the films with Dr Haddix/orthopedic trauma attendings at [facility] who felt that she may benefit from stabilization given that she is having significant difficulty with ambulation due to pain.  Pelvic stabilization is not offered at this facility, so transfer will be necessary for higher level of care where orthopedic trauma specialists are more available   Assessment and Plan: Sacral insufficiency fracture L5 transverse process fracture secondary to mechanical fall Intractable back pain with ambulatory dysfunction Chronic back pain on chronic opiates Chronic physical deconditioning -now s/p fixation of B sacral insufficiency fractures 1/24 -f/u with PT/OT recs which were for SNF -this AM, pt felt unsure about going to SNF and thought she would rather just go home. Pt is alert and oriented, has insight to care, thus able to make medical decisions -TOC is following for SNF placement   Paroxysmal atrial fibrillation (HCC) Continue metoprolol and aspirin Not on systemic anticoagulation as paroxysmal A-fib was in the setting of acute infection Currently rate controlled   Coronary artery disease involving native coronary artery of native heart without angina pectoris Continue aspirin, rosuvastatin and metoprolol  -Seems stable   GAD (generalized anxiety disorder) Continue home duloxetine, hydroxyzine -Per pharmacy, doxepin has  been ineffective for pt historically   Seronegative rheumatoid arthritis ([place]) On methotrexate and Remicade and Plaquenil   Type 2 diabetes mellitus ([place]) Sliding scale insulin coverage Seen by diabetic coordinator Glycemic trends stable   Congestive heart failure with left ventricular systolic dysfunction (HCC) Continue Lasix and metoprolol   Hypothyroidism Continue levothyroxine as tolerated  Constipation Continue with bowel regimen as needed      Subjective: Without complaints this AM  Physical Exam: Vitals:   02/05/22 1656 02/05/22 2107 02/06/22 0511 02/06/22 0824  BP: 106/62 (!) 109/55 (!) 166/75 (!) 145/68  Pulse: 83 70 74 75  Resp: 16 20 20 15   Temp: 98.3 F (36.8 C) 98.1 F (36.7 C) 98.1 F (36.7 C) 97.8 F (36.6 C)  TempSrc: Oral   Oral  SpO2: 91%   100%   General exam: Conversant, in no acute distress Respiratory system: normal chest rise, clear, no audible wheezing Cardiovascular system: regular rhythm, s1-s2 Gastrointestinal system: Nondistended, nontender, pos BS Central nervous system: No seizures, no tremors Extremities: No cyanosis, no joint deformities Skin: No rashes, no pallor Psychiatry: Affect normal // no auditory hallucinations   Data Reviewed:  There are no new results to review at this time.  Family Communication: Pt in room, family not at bedside  Disposition: Status is: Inpatient Remains inpatient appropriate because: Severity of illness  Planned Discharge Destination: Skilled nursing facility    Author: Marylu Lund, MD 02/06/2022 4:32 PM  For on call review [email or web address].

## 2022-02-07 DIAGNOSIS — M25561 Pain in right knee: Secondary | ICD-10-CM | POA: Diagnosis not present

## 2022-02-07 DIAGNOSIS — S72001A Fracture of unspecified part of neck of right femur, initial encounter for closed fracture: Secondary | ICD-10-CM | POA: Diagnosis not present

## 2022-02-07 DIAGNOSIS — G8929 Other chronic pain: Secondary | ICD-10-CM | POA: Diagnosis not present

## 2022-02-07 LAB — GLUCOSE, CAPILLARY
Glucose-Capillary: 107 mg/dL — ABNORMAL HIGH (ref 70–99)
Glucose-Capillary: 119 mg/dL — ABNORMAL HIGH (ref 70–99)
Glucose-Capillary: 141 mg/dL — ABNORMAL HIGH (ref 70–99)
Glucose-Capillary: 152 mg/dL — ABNORMAL HIGH (ref 70–99)
Glucose-Capillary: 208 mg/dL — ABNORMAL HIGH (ref 70–99)

## 2022-02-07 MED ORDER — INSULIN ASPART 100 UNIT/ML IJ SOLN
0.0000 [IU] | Freq: Three times a day (TID) | INTRAMUSCULAR | Status: DC
  Administered 2022-02-07: 5 [IU] via SUBCUTANEOUS

## 2022-02-07 MED ORDER — OXYCODONE HCL 10 MG PO TABS: 5.0000 mg | ORAL_TABLET | ORAL | 0 refills | Status: DC | PRN

## 2022-02-07 MED ORDER — INSULIN ASPART 100 UNIT/ML IJ SOLN: 0.0000 [IU] | Freq: Every day | INTRAMUSCULAR | Status: DC

## 2022-02-07 MED ORDER — ASPIRIN 325 MG PO TBEC: 325.0000 mg | DELAYED_RELEASE_TABLET | Freq: Every day | ORAL | 0 refills | Status: AC

## 2022-02-07 MED ORDER — VITAMIN D (ERGOCALCIFEROL) 1.25 MG (50000 UNIT) PO CAPS: 50000.0000 [IU] | ORAL_CAPSULE | ORAL | 0 refills | Status: DC

## 2022-02-07 NOTE — TOC Progression Note (Signed)
Transition of Care [facility]) - Progression Note    Patient Details  Name: Laura [name] MRN: [id number] Date of Birth: 01/07/1946  [facility]) CM/SW Contact  Joanne Chars, LCSW Phone Number: 02/07/2022, 12:57 PM  Clinical Narrative:   no pain med hard script in pt drawer.  CSW reaching out to MD/PA trying to solve this problem.  PTAR now on hold.    Expected Discharge Plan: [place] Barriers to Discharge: Barriers Resolved  Expected Discharge Plan and Services In-house Referral: Clinical Social Work   Post Acute Care Choice: [place] Living arrangements for the past 2 months: [place] Expected Discharge Date: 02/07/22                                     Social Determinants of Health (SDOH) Interventions SDOH Screenings   Food Insecurity: No Food Insecurity (02/01/2022)  Housing: Low Risk  (02/01/2022)  Transportation Needs: No Transportation Needs (02/01/2022)  Utilities: Not At Risk (02/01/2022)  Depression (PHQ2-9): Low Risk  (10/21/2021)  Financial Resource Strain: Low Risk  (11/15/2016)  Physical Activity: Inactive (11/15/2016)  Social Connections: Somewhat Isolated (11/15/2016)  Stress: No Stress Concern Present (11/15/2016)  Tobacco Use: Low Risk  (02/04/2022)    Readmission Risk Interventions     No data to display

## 2022-02-07 NOTE — Discharge Summary (Signed)
Physician Discharge Summary   Patient: Laura [name] MRN: [id number] DOB: 02-03-1945  Admit date:     02/02/2022  Discharge date: 02/07/22  Discharge Physician: Rickey Barbara   PCP: Lynnea Ferrier, MD   Recommendations at discharge:    Follow up with PCP in 1-2 weeks Follow up with Orthopedic Surgery as scheduled  Discharge Diagnoses: Principal Problem:   Displaced fracture of right femoral neck (HCC)  Resolved Problems:   * No resolved hospital problems. *  Hospital Course: 77 y.o. female with k/h/o HTN, hypothyroidism, RA on immunosupressants, Paroxysmal a.fib not on anticoagulation, chronic back pain on opiates, anxiety, depression,  left L5 transverse process fracture s/p fall with known lumbar stenosis.  She has neurogenic claudication from that. She has an acute sacral fracture consistent with a sacral insufficiency fracture, Neurosurgery Dr Myer Haff reviewed the films with Dr Haddix/orthopedic trauma attendings at [facility] [place] who felt that she may benefit from stabilization given that she is having significant difficulty with ambulation due to pain.  Pelvic stabilization is not offered at this facility, so transfer will be necessary for higher level of care where orthopedic trauma specialists are more available   Assessment and Plan: Sacral insufficiency fracture L5 transverse process fracture secondary to mechanical fall Intractable back pain with ambulatory dysfunction Chronic back pain on chronic opiates Chronic physical deconditioning -now s/p fixation of B sacral insufficiency fractures 1/24 -f/u with PT/OT recs which were for SNF -TOC followed for SNF placement   Paroxysmal atrial fibrillation (HCC) Continue metoprolol and aspirin Not on systemic anticoagulation as paroxysmal A-fib was in the setting of acute infection Currently rate controlled   Coronary artery disease involving native coronary artery of native heart without angina pectoris Continue aspirin,  rosuvastatin and metoprolol  -Seems stable   GAD (generalized anxiety disorder) Continue home duloxetine, hydroxyzine -Per pharmacy, doxepin has been ineffective for pt historically   Seronegative rheumatoid arthritis (HCC) On methotrexate and Remicade and Plaquenil   Type 2 diabetes mellitus (HCC) Sliding scale insulin coverage while in hospital Seen by diabetic coordinator Glycemic trends stable   Congestive heart failure with left ventricular systolic dysfunction (HCC) Continue Lasix and metoprolol   Hypothyroidism Continue levothyroxine as tolerated   Constipation Continue with bowel regimen as needed       Consultants: Orthopedic Surgery Procedures performed: Percutaneous fixation of bilateral sacral insufficiency fractures  Disposition: Skilled nursing facility Diet recommendation:  Carb modified diet DISCHARGE MEDICATION: Allergies as of 02/07/2022       Reactions   Bupropion Other (See Comments)   Siezures   Jardiance [empagliflozin] Other (See Comments)   Projectile vomiting   Penicillins Nausea And Vomiting, Rash        Medication List     TAKE these medications    albuterol 108 (90 Base) MCG/ACT inhaler Commonly known as: VENTOLIN HFA Inhale 2 puffs into the lungs every 6 (six) hours as needed for wheezing or shortness of breath.   ascorbic acid 500 MG tablet Commonly known as: VITAMIN C Take 500 mg by mouth daily.   aspirin EC 325 MG tablet Take 1 tablet (325 mg total) by mouth daily. What changed:  medication strength how much to take   docusate sodium 100 MG capsule Commonly known as: COLACE Take 100 mg by mouth every Monday, Wednesday, and Friday.   doxepin 10 MG capsule Commonly known as: SINEQUAN Take 10 mg by mouth at bedtime.   DULoxetine 60 MG capsule Commonly known as: Cymbalta Take 1 capsule (60 mg  total) by mouth daily.   folic acid 1 MG tablet Commonly known as: FOLVITE Take 1 mg by mouth daily.   furosemide 80 MG  tablet Commonly known as: LASIX Take 80 mg by mouth every Monday, Wednesday, and Friday.   hydroxychloroquine 200 MG tablet Commonly known as: PLAQUENIL Take 200 mg by mouth daily.   inFLIXimab 100 MG injection Commonly known as: REMICADE Inject 100 mg into the vein every 8 (eight) weeks.   levothyroxine 125 MCG tablet Commonly known as: SYNTHROID Take 250 mcg by mouth daily before breakfast.   loratadine 10 MG tablet Commonly known as: CLARITIN Take 10 mg by mouth daily.   methotrexate 2.5 MG tablet Commonly known as: RHEUMATREX Take 10 mg by mouth every Sunday.   metoprolol succinate 100 MG 24 hr tablet Commonly known as: TOPROL-XL Take 1 tablet (100 mg total) by mouth daily. Take with or immediately following a meal.   naloxone 4 MG/0.1ML Liqd nasal spray kit Commonly known as: NARCAN Place 1 spray into the nose as needed for up to 365 doses (for opioid-induced respiratory depresssion). In case of emergency (overdose), spray once into each nostril. If no response within 3 minutes, repeat application and call 911.   Oxycodone HCl 10 MG Tabs Take 0.5-1 tablets (5-10 mg total) by mouth every 4 (four) hours as needed for severe pain or moderate pain. What changed:  medication strength how much to take when to take this reasons to take this additional instructions   oxyCODONE 5 MG immediate release tablet Commonly known as: Oxy IR/ROXICODONE Take 1 tablet (5 mg total) by mouth every 6 (six) hours as needed for severe pain. Must last 30 days Start taking on: February 13, 2022 What changed: Another medication with the same name was changed. Make sure you understand how and when to take each.   oxyCODONE 5 MG immediate release tablet Commonly known as: Oxy IR/ROXICODONE Take 1 tablet (5 mg total) by mouth every 6 (six) hours as needed for severe pain. Must last 30 days Start taking on: March 15, 2022 What changed: Another medication with the same name was changed. Make  sure you understand how and when to take each.   potassium chloride 10 MEQ tablet Commonly known as: KLOR-CON Take 10 mEq by mouth every Monday, Wednesday, and Friday.   rosuvastatin 5 MG tablet Commonly known as: CRESTOR Take 5 mg by mouth See admin instructions. On FRI and SUN   Vitamin D (Ergocalciferol) 1.25 MG (50000 UNIT) Caps capsule Commonly known as: DRISDOL Take 1 capsule (50,000 Units total) by mouth every 7 (seven) days. Start taking on: February 11, 2022        Follow-up Information     Haddix, Gillie Manners, MD. Schedule an appointment as soon as possible for a visit in 2 week(s).   Specialty: Orthopedic Surgery Why: for wound check and repeat x-rays pelvis Contact information: [address] [place] [address] [phone]         Curtis Sites III, MD Follow up in 2 week(s).   Specialty: Internal Medicine Why: Hospital follow up Contact information: [address] [facility] [place] [address] [phone]                Discharge Exam: There were no vitals filed for this visit. General exam: Awake, laying in bed, in nad Respiratory system: Normal respiratory effort, no wheezing Cardiovascular system: regular rate, s1, s2 Gastrointestinal system: Soft, nondistended, positive BS Central nervous system:  CN2-12 grossly intact, strength intact Extremities: Perfused, no clubbing Skin: Normal skin turgor, no notable skin lesions seen Psychiatry: Mood normal // no visual hallucinations   Condition at discharge: fair  The results of significant diagnostics from this hospitalization (including imaging, microbiology, ancillary and laboratory) are listed below for reference.   Imaging Studies: DG Pelvis Comp Min 3V  Result Date: 02/03/2022 CLINICAL DATA:  [id number] with sacral fracture noted on CT. EXAM: JUDET PELVIS - 3+ VIEW COMPARISON:  Intraoperative spot fluoroscopic radiographs during iliosacral screw fixation earlier today.  FINDINGS: Judet pelvic views again demonstrate a single iliosacral screw fixation. Several incomplete clefts in the screw are again shown. Positioning and alignment are unchanged. There is osteopenia, old right hip nailing and advanced bilateral degenerative hip arthrosis with enthesopathic change of the pelvis. IMPRESSION: No interval change in the appearance of the iliosacral screw fixation. Several incomplete clefts in the screw are again shown. Electronically Signed   By: Almira Bar M.D.   On: 02/03/2022 05:29   DG Pelvis Comp Min 3V  Result Date: 02/02/2022 CLINICAL DATA:  Fluoro guidance provided EXAM: JUDET PELVIS - 3+ VIEW FINDINGS: Dose: 30.1 mGy Fluoro time 156s IMPRESSION: C-arm fluoro guidance provided. Electronically Signed   By: Layla Maw M.D.   On: 02/02/2022 14:33   DG C-Arm 1-60 Min-No Report  Result Date: 02/02/2022 Fluoroscopy was utilized by the requesting physician.  No radiographic interpretation.   US Venous Img Lower Bilateral  Result Date: 02/01/2022 CLINICAL DATA:  Bilateral leg swelling EXAM: BILATERAL LOWER EXTREMITY VENOUS DOPPLER ULTRASOUND TECHNIQUE: Gray-scale sonography with compression, as well as color and duplex ultrasound, were performed to evaluate the deep venous system(s) from the level of the common femoral vein through the popliteal and proximal calf veins. COMPARISON:  None Available. FINDINGS: VENOUS Normal compressibility of the common femoral, superficial femoral, and popliteal veins. The calf veins are not well visualized bilaterally due to the patient's body habitus. Visualized portions of profunda femoral vein and great saphenous vein unremarkable. No filling defects to suggest DVT on grayscale or color Doppler imaging. Doppler waveforms show normal direction of venous flow, normal respiratory plasticity and response to augmentation. OTHER None. Limitations: none IMPRESSION: 1. No evidence of femoropopliteal DVT bilaterally Electronically Signed    By: Helyn Numbers M.D.   On: 02/01/2022 01:48   CT ABDOMEN PELVIS W CONTRAST  Addendum Date: 02/01/2022   ADDENDUM REPORT: 02/01/2022 00:53 ADDENDUM: In retrospect, there are nondisplaced insufficiency fractures of the anterior right and left aspects of the sacral ala at S2, with fracture continuing across the ventral S2 body where there is inward cortical buckling. There is also a nondisplaced fracture of the left L5 transverse process. The sacral fracture likely accounts for the minimal presacral fluid and stranding noted, although the patient may still have a mild proctitis. Electronically Signed   By: Almira Bar M.D.   On: 02/01/2022 00:53   Result Date: 02/01/2022 CLINICAL DATA:  Fall injury January 11th with continued or worsening back pain. Loss of ambulatory status since then. History of chronic neurogenic claudication from lumbar stenosis. EXAM: CT ABDOMEN AND PELVIS WITH CONTRAST TECHNIQUE: Multidetector CT imaging of the abdomen and pelvis was performed using the standard protocol following bolus administration of intravenous contrast. RADIATION DOSE REDUCTION: This exam was performed according to the departmental dose-optimization program which includes automated exposure control, adjustment of the mA and/or kV according to patient size and/or use of iterative reconstruction technique. CONTRAST:  OMNIPAQUE IOHEXOL 300 MG/ML  SOLN COMPARISON:  CT with IV contrast 01/14/2021. FINDINGS: Lower chest: Chronic scarring changes again noted in the base of the right middle lobe and lingula. Lung bases are otherwise clear. There is mild cardiomegaly. Hepatobiliary: No focal liver abnormality is seen. No calcified gallstones, gallbladder wall thickening, or biliary dilatation. Pancreas: Diffuse fatty atrophy. No focal abnormality. No abnormality. No splenomegaly. Spleen: No abnormality. Adrenals/Urinary Tract: There is no adrenal mass. There is a 1.2 cm hyperdense cyst in the posterior aspect of  the left kidney which today measures 78 Hounsfield units both in the initial postcontrast phase and in the delayed phase, previously measuring 7.7 Hounsfield units. There are scattered bilateral renal cortical scar-like defects, bilateral small renal lengths 8 cm on the right and 8.2 cm on the left with cortical thinning. No adrenal mass is seen. There is no urinary stone or obstruction. Bilateral week excretion of contrast is noted in the delayed phase. Stomach/Bowel: The stomach and unopacified small bowel are unremarkable. The appendix is normal caliber, retrocecal. There is moderate retained stool ascending and transverse colon. Left-sided diverticula. No findings of acute diverticulitis or colitis. Possible however that the patient could have Proctitis or rectal infiltrating disease, with posterior thickening in the distal rectal wall noted and trace stranding which could be inflammatory or congestive, minimal presacral ascites also seen in the area. Vascular/Lymphatic: Aortic atherosclerosis. No enlarged abdominal or pelvic lymph nodes. Reproductive: Status post hysterectomy. No adnexal masses. Other: As above there is minimal presacral ascites. There is a small umbilical fat hernia with no incarcerated hernia. There is no free hemorrhage, abscess or free air. Musculoskeletal: No acute lumbar compression fracture is evident. Osteopenia and degenerative change of the lumbar spine are again noted. There is acquired spinal stenosis at L3-4 and L4-5, severe and unchanged. Variable foraminal stenosis multiple lumbar levels greatest at L3-4, L4-5 and L5-S1. There is bilateral hip DJD with an old right hip nailing. Chronic fracture deformities of the right pelvic rami. IMPRESSION: 1. No acute trauma related findings in the abdomen or pelvis. 2. Constipation and diverticulosis. 3. Posterior rectal wall thickening with trace stranding and minimal presacral ascites. Findings could be due to proctitis or rectal  infiltrating disease. Further evaluation recommended. 4. 1.2 cm hyperdense cyst in the posterior left kidney, was previously much lower in density. 5. Aortic atherosclerosis. 6. Cardiomegaly. 7. Osteopenia and degenerative change with acquired spinal stenosis at L3-4 and L4-5, and multilevel foraminal stenosis. 8. Umbilical fat hernia. Aortic Atherosclerosis (ICD10-I70.0). Electronically Signed: By: Telford Nab M.D. On: 02/01/2022 00:24   CT L-SPINE NO CHARGE  Result Date: 02/01/2022 CLINICAL DATA:  Back pain, frequent falls, and recent loss of ambulation. EXAM: CT LUMBAR SPINE WITHOUT CONTRAST TECHNIQUE: Multidetector CT imaging of the lumbar spine was performed without intravenous contrast administration. Multiplanar CT image reconstructions were also generated. RADIATION DOSE REDUCTION: This exam was performed according to the departmental dose-optimization program which includes automated exposure control, adjustment of the mA and/or kV according to patient size and/or use of iterative reconstruction technique. COMPARISON:  MRI lumbar spine from 12/16/2019 and 04/29/2021 FINDINGS: Segmentation: 5 lumbar type vertebrae. Alignment: Grade 1 degenerative anterolisthesis again noted at L3-4 and L4-5, unchanged. No other alignment abnormality. Vertebrae: Osteopenia. There is a nondisplaced transverse fracture of the left L5 transverse process. No other lumbar fracture or destructive bone lesion is seen. There is lumbar spondylosis. A prominent upper plate Schmorl's node is again shown at L2, additional Schmorl's node formation opposite L3-4, L4-5, and L5-S1. Paraspinal and other soft  tissues: Partial fatty atrophy in the intrinsic back musculature similar to prior study. There is aortic atherosclerosis without AAA. There is no paraspinal hematoma. Disc levels: T10-11: There are anterior osteophytes with attempted bridging. Mild disc space loss. No herniation or stenosis. T11-12: No herniation or stenosis.  T12-L1: No herniation or stenosis. L1-2: There is a small calcified right paracentral disc extrusion impressing on the right ventral thecal sac. Similar mild disc space loss. Mild facet joint spurring and mild foraminal stenosis are again shown. There is mild spinal stenosis due to ligamentous and facet hypertrophy and the disc extrusion. Mild degenerative foraminal stenosis. L2-3: There is preservation of the normal disc height. Dorsal ligamentous and facet hypertrophy are again noted and a posterior disc bulge, causing moderate acquired spinal canal stenosis. There is only mild foraminal stenosis. L3-4: There is mild disc space loss. Circumferential disc osteophyte complex. This and advanced facet hypertrophy with ligamentous thickening combine to cause severe acquired spinal stenosis, unchanged. There is moderate right and mild to moderate left foraminal stenosis. L4-5: This disc remains normal in height. There are small endplate spurs. Due to a dorsal annular bulge and advanced facet hypertrophy with ligamentous thickening, there is severe acquired spinal canal stenosis and effacement of the thecal sac continuing into the subarticular zones. There is moderate right-greater-than-left foraminal stenosis. L5-S1: There is mild disc space loss. There is a mild nonstenosing posterior disc bulge. Moderate facet hypertrophy is seen with severe right, mild-to-moderate left foraminal stenosis. Other: Patent SI joints with mild spurring and vacuum phenomenon. Noted are nondisplaced acute insufficiency fractures of the anterior right and left aspects of the sacral ala at S2, extending ventrally across the S2 body with inward cortical buckling anteriorly. IMPRESSION: 1. Acute nondisplaced fracture of the left L5 transverse process. No lumbar compression fracture is seen. 2. Acute nondisplaced insufficiency fractures of the anterior right and left aspects of the sacral ala at S2, extending ventrally across the S2 body with  inward cortical buckling. 3. Osteopenia and degenerative changes. 4. Severe acquired spinal canal stenosis at L3-4 and L4-5, moderate at L2-3. 5. Multilevel foraminal stenosis greatest on the right at L5-S1. 6. Aortic atherosclerosis. Aortic Atherosclerosis (ICD10-I70.0). Electronically Signed   By: Almira Bar M.D.   On: 02/01/2022 00:50   DG Chest Portable 1 View  Result Date: 01/31/2022 CLINICAL DATA:  Fall and weakness EXAM: PORTABLE CHEST 1 VIEW COMPARISON:  Chest x-ray 11/11/2019 FINDINGS: Heart is mildly enlarged. There central pulmonary vascular congestion. There is no focal lung infiltrate, pleural effusion or pneumothorax. No acute fractures are seen. IMPRESSION: No active disease. Electronically Signed   By: Darliss Cheney M.D.   On: 01/31/2022 23:09    Microbiology: Results for orders placed or performed during the hospital encounter of 02/02/22  Surgical pcr screen     Status: Abnormal   Collection Time: 02/02/22 10:32 AM   Specimen: Nasal Mucosa; Nasal Swab  Result Value Ref Range Status   MRSA, PCR NEGATIVE NEGATIVE Final   Staphylococcus aureus POSITIVE (A) NEGATIVE Final    Comment: (NOTE) The Xpert SA Assay (FDA approved for NASAL specimens in patients 39 years of age and older), is one component of a comprehensive surveillance program. It is not intended to diagnose infection nor to guide or monitor treatment. Performed at [facility], [address] [place], [place] [address]     Labs: CBC: Recent Labs  Lab 01/31/22 1956 02/02/22 0512 02/02/22 1557 02/03/22 0206 02/04/22 0444  WBC 10.9* 8.7 8.6 9.6  11.4*  NEUTROABS 7.5  --   --   --   --   HGB 12.5 11.9* 11.6* 10.9* 10.4*  HCT 38.8 37.5 35.8* 34.5* 31.9*  MCV 88.8 90.6 90.4 90.1 90.1  PLT 401* 364 355 415* 937   Basic Metabolic Panel: Recent Labs  Lab 01/31/22 1956 02/02/22 0512 02/02/22 1557 02/03/22 0206 02/04/22 0444  NA 134* 137  --  133* 136  K 3.3* 3.5  --  4.5 4.0  CL 90* 94*  --   95* 96*  CO2 30 30  --  30 30  GLUCOSE 154* 149*  --  212* 141*  BUN 18 27*  --  28* 31*  CREATININE 0.97 1.09* 1.09* 1.24* 1.10*  CALCIUM 8.6* 8.6*  --  8.4* 8.3*   Liver Function Tests: Recent Labs  Lab 01/31/22 1956 02/03/22 0206 02/04/22 0444  AST 18 17 13*  ALT 10 10 6   ALKPHOS 74 86 78  BILITOT 0.5 0.2* 0.5  PROT 7.4 6.1* 5.7*  ALBUMIN 3.5 2.7* 2.7*   CBG: Recent Labs  Lab 02/06/22 1635 02/06/22 1951 02/07/22 0015 02/07/22 0421 02/07/22 0758  GLUCAP 176* 133* 141* 107* 119*    Discharge time spent: less than 30 minutes.  Signed: Marylu Lund, MD [facility] 02/07/2022

## 2022-02-07 NOTE — Plan of Care (Signed)

## 2022-02-07 NOTE — Plan of Care (Addendum)
Called report to [facility]. Also called [facility]. Patient stable, IV removed, daughter at bedside. Will continue to monitor.    Problem: Education: Goal: Knowledge of General Education information will improve Description: Including pain rating scale, medication(s)/side effects and non-pharmacologic comfort measures Outcome: Progressing   Problem: Activity: Goal: Risk for activity intolerance will decrease Outcome: Progressing   Problem: Pain Managment: Goal: General experience of comfort will improve Outcome: Progressing   Problem: Safety: Goal: Ability to remain free from injury will improve Outcome: Progressing   Problem: Skin Integrity: Goal: Risk for impaired skin integrity will decrease Outcome: Progressing   Problem: Education: Goal: Ability to describe self-care measures that may prevent or decrease complications (Diabetes Survival Skills Education) will improve Outcome: Progressing   Problem: Skin Integrity: Goal: Risk for impaired skin integrity will decrease Outcome: Progressing   Problem: Skin Integrity: Goal: Risk for impaired skin integrity will decrease Outcome: Progressing

## 2022-02-07 NOTE — TOC Transition Note (Signed)
Transition of Care [facility]) - CM/SW Discharge Note   Patient Details  Name: Laura [name] MRN: [id number] Date of Birth: [birth year]  Transition of Care [facility]) CM/SW Contact:  Joanne Chars, LCSW Phone Number: 02/07/2022, 11:30 AM   Clinical Narrative:   Pt discharging to [facility] [place], room 801.  RN call report to [phone].      0930: CSW spoke with pt and confirmed plan for SNF DC today.  CSW spoke with [facility] and confirmed they can accept pt today.   Final next level of care: Skilled Nursing Facility Barriers to Discharge: Barriers Resolved   Patient Goals and CMS Choice CMS Medicare.gov Compare Post Acute Care list provided to:: Patient Choice offered to / list presented to : Patient  Discharge Placement                Patient chooses bed at:  ([facility]) Patient to be transferred to facility by: [facility] Name of family member notified: daughter Kathlee Nations Patient and family notified of of transfer: 02/07/22  Discharge Plan and Services Additional resources added to the After Visit Summary for   In-house Referral: Clinical Social Work   Post Acute Care Choice: [place]                               Social Determinants of Health ([place]) Interventions [place]: No Food Insecurity (02/01/2022)  Housing: Low Risk  (02/01/2022)  Transportation Needs: No Transportation Needs (02/01/2022)  Utilities: Not At Risk (02/01/2022)  Depression (PHQ2-9): Low Risk  (10/21/2021)  Financial Resource Strain: Low Risk  (11/15/2016)  Physical Activity: Inactive (11/15/2016)  Social Connections: Somewhat Isolated (11/15/2016)  Stress: No Stress Concern Present (11/15/2016)  Tobacco Use: Low Risk  (02/04/2022)     Readmission Risk Interventions     No data to display

## 2022-02-10 NOTE — Telephone Encounter (Signed)
Noted  

## 2022-02-22 ENCOUNTER — Ambulatory Visit: Payer: Medicare Other | Admitting: Neurosurgery

## 2022-03-02 ENCOUNTER — Other Ambulatory Visit: Payer: Self-pay | Admitting: Psychiatry

## 2022-03-02 DIAGNOSIS — F3342 Major depressive disorder, recurrent, in full remission: Secondary | ICD-10-CM

## 2022-03-29 ENCOUNTER — Encounter: Payer: Self-pay | Admitting: Neurosurgery

## 2022-03-29 ENCOUNTER — Ambulatory Visit (INDEPENDENT_AMBULATORY_CARE_PROVIDER_SITE_OTHER): Payer: Medicare Other | Admitting: Neurosurgery

## 2022-03-29 VITALS — BP 130/74 | HR 66 | Ht 65.0 in | Wt 240.0 lb

## 2022-03-29 DIAGNOSIS — M4310 Spondylolisthesis, site unspecified: Secondary | ICD-10-CM | POA: Diagnosis not present

## 2022-03-29 DIAGNOSIS — M48062 Spinal stenosis, lumbar region with neurogenic claudication: Secondary | ICD-10-CM | POA: Diagnosis not present

## 2022-03-29 NOTE — Progress Notes (Signed)
Referring Physician:  Meade Maw, MD [address] [place],  [place] [address]  Primary Physician:  Adin Hector, MD  History of Present Illness: 03/29/2022 Laura [name] returns to see me.  She is about 6 weeks s/p fixation for a pelvic fracture by Dr. Doreatha Martin.  She is recovering from that injury, and has been doing physical therapy.  She continues to have some back symptoms though they are not life limiting.    07/29/2021 Laura [name] is here today with a chief complaint of  low back pain as well as intermittent numbness in her legs particularly from her knees distally. She also endorses intermittent pain from her back radiating to her buttocks worse on the left than the right. Along with progressive weakness in her right foot. She has also been having urinary incontinence for the past 6-7 months.   She has been having progressive issues over the past 10 years.  Her pain is as bad as 9 out of 10 particular when she stands or walks.  She can walk with physical therapy, but is very limited due to her bilateral foot drops.   Bowel/Bladder Dysfunction: none  Conservative measures:  Physical therapy: has participated in [facility] with some relief Multimodal medical therapy including regular antiinflammatories: cymbalta, oxycodone Injections: has had epidural steroid injections with Dr. Dossie Arbour   Past Surgery: denies  Laura [name] has no symptoms of cervical myelopathy.  The symptoms are causing a significant impact on the patient's life.    Progress Note from Cooper Render, [place] on 06/03/21: History of Present Illness: 06/03/2021 Laura [name] is a 77 y.o with a history of obesity, rheumatoid arthritis on Plaquenil,CAD, HLD, lymphedema, hypertension, A-fib, CKD and chronic lumbosacral complaints who is here today for evaluation of her lumbar spine.  She states that she has continued to have low back pain since her visit last  year as well as intermittent numbness in her legs particularly from her knees distally that seems to be worse with activity. She also endorses intermittent pain from her back radiating to her buttocks worse on the left than the right. She states she did have a fall about a year and a half ago which resulted in pain in her left lateral calf this is since resolved however she notes about 6 to 7 months of progressive weakness in her right foot which is new since her last visit. She also endorses about a year and a half intermittent urinary incontinence and wears diapers. Due to her progressive weakness in her lower extremities she has a fear of falling and is thus largely wheelchair-bound at home. Review of Systems:  A 10 point review of systems is negative, except for the pertinent positives and negatives detailed in the HPI.  Past Medical History: Past Medical History:  Diagnosis Date   Allergy    Anxiety    Arthritis, degenerative 10/08/2013   Overview:    a.  Lumbar spine/spinal stenosis/foot drop.   b.  Hands.    Asthma    Chronic hand pain, left 10/10/2019   Chronic kidney disease    Depression    Lumbar spinal stenosis with neurogenic claudication 11/07/2014   Major depression, single episode, in complete remission ([place]) 06/25/2015   Memory loss, short term 03/19/2014   Rheumatoid arthritis ([place])    Sciatica of left side 12/31/2019   Seizure ([place]) 10/07/2014   Sleep apnea    Sleep apnea    Thyroid disease  Past Surgical History: Past Surgical History:  Procedure Laterality Date   ABDOMINAL HYSTERECTOMY     CESAREAN SECTION     HIP SURGERY Right    ORIF PELVIC FRACTURE WITH PERCUTANEOUS SCREWS Bilateral 02/02/2022   Procedure: ORIF PELVIC FRACTURE WITH PERCUTANEOUS SCREWS;  Surgeon: Shona Needles, MD;  Location: [facility];  Service: Orthopedics;  Laterality: Bilateral;   REPLACEMENT TOTAL KNEE Left     Allergies: Allergies as of 03/29/2022 - Review Complete 03/29/2022   Allergen Reaction Noted   Bupropion Other (See Comments) 02/01/2022   Jardiance [empagliflozin] Other (See Comments) 01/14/2021   Penicillins Nausea And Vomiting and Rash 10/07/2014    Medications: Current Meds  Medication Sig   albuterol (VENTOLIN HFA) 108 (90 Base) MCG/ACT inhaler Inhale 2 puffs into the lungs every 6 (six) hours as needed for wheezing or shortness of breath.   ascorbic acid (VITAMIN C) 500 MG tablet Take 500 mg by mouth daily.   docusate sodium (COLACE) 100 MG capsule Take 100 mg by mouth every Monday, Wednesday, and Friday.   doxepin (SINEQUAN) 10 MG capsule Take 10 mg by mouth at bedtime.   DULoxetine (CYMBALTA) 60 MG capsule TAKE 1 CAPSULE BY MOUTH EVERY DAY   folic acid (FOLVITE) 1 MG tablet Take 1 mg by mouth daily.   furosemide (LASIX) 80 MG tablet Take 80 mg by mouth every Monday, Wednesday, and Friday.   hydroxychloroquine (PLAQUENIL) 200 MG tablet Take 200 mg by mouth daily.   inFLIXimab (REMICADE) 100 MG injection Inject 100 mg into the vein every 8 (eight) weeks.   levothyroxine (SYNTHROID) 125 MCG tablet Take 250 mcg by mouth daily before breakfast.   loratadine (CLARITIN) 10 MG tablet Take 10 mg by mouth daily.    methotrexate (RHEUMATREX) 2.5 MG tablet Take 10 mg by mouth every Sunday.   metoprolol succinate (TOPROL-XL) 100 MG 24 hr tablet Take 1 tablet (100 mg total) by mouth daily. Take with or immediately following a meal.   naloxone (NARCAN) nasal spray 4 mg/0.1 mL Place 1 spray into the nose as needed for up to 365 doses (for opioid-induced respiratory depresssion). In case of emergency (overdose), spray once into each nostril. If no response within 3 minutes, repeat application and call [id number].   oxyCODONE (OXY IR/ROXICODONE) 5 MG immediate release tablet Take 1 tablet (5 mg total) by mouth every 6 (six) hours as needed for severe pain. Must last 30 days   Oxycodone HCl 10 MG TABS Take 0.5-1 tablets (5-10 mg total) by mouth every 4 (four) hours as  needed.   potassium chloride (KLOR-CON) 10 MEQ tablet Take 10 mEq by mouth every Monday, Wednesday, and Friday.   rosuvastatin (CRESTOR) 5 MG tablet Take 5 mg by mouth See admin instructions. On FRI and SUN   Vitamin D, Ergocalciferol, (DRISDOL) 1.25 MG (50000 UNIT) CAPS capsule Take 1 capsule (50,000 Units total) by mouth every 7 (seven) days.    Social History: Social History   Tobacco Use   Smoking status: Never   Smokeless tobacco: Never  Vaping Use   Vaping Use: Never used  Substance Use Topics   Alcohol use: No    Alcohol/week: 0.0 standard drinks of alcohol   Drug use: No    Family Medical History: Family History  Problem Relation Age of Onset   Hypertension Mother    Stroke Mother    Heart attack Father    Alcohol abuse Father    Depression Father    Post-traumatic stress disorder Father  Rheum arthritis Sister    Hypertension Sister    Depression Sister    Breast cancer Neg Hx     Physical Examination: Vitals:   03/29/22 1540  BP: 130/74  Pulse: 66  SpO2: 97%    General: Patient is well developed, well nourished, calm, collected, and in no apparent distress. Attention to examination is appropriate.  Neck:   Supple.  Full range of motion.  Respiratory: Patient is breathing without any difficulty.   NEUROLOGICAL:     Awake, alert, oriented to person, place, and time.  Speech is clear and fluent. Fund of knowledge is appropriate.   Cranial Nerves: Pupils equal round and reactive to light.  Facial tone is symmetric.  Facial sensation is symmetric. Shoulder shrug is symmetric. Tongue protrusion is midline.  There is no pronator drift.  ROM of spine: full.    Strength: Side Biceps Triceps Deltoid Interossei Grip Wrist Ext. Wrist Flex.  R 5 5 5 5 5 5 5   L 5 5 5 5 5 5 5    Side Iliopsoas Quads Hamstring PF DF EHL  R 5 5 5 3  0 0  L 5 5 5 3 1  0   Reflexes are 1+ and symmetric at the biceps, triceps, brachioradialis, patella and achilles.   Hoffman's  is absent.  Clonus is not present.   Bilateral upper and lower extremity sensation is symmetric but limited in her bilateral extremities to light touch.    No evidence of dysmetria noted.  Gait is untested due to bilateral foot drop.     Medical Decision Making  Imaging: MRI L spine 04/29/21 IMPRESSION: 1. Advanced lumbar spine degeneration with L3-4 and L4-5 anterolisthesis. 2. L3-4 chronic, severe spinal stenosis. 3. L2-3 and L4-5 moderate to advanced spinal stenosis. Bilateral L5 impingement at the subarticular recesses of L4-5.     Electronically Signed   By: Jorje Guild M.D.   On: 04/30/2021 06:15  I have personally reviewed the images and agree with the above interpretation.  Assessment and Plan: Laura [name] is a pleasant 77 y.o. female with back pain, neurogenic claudication, and bilateral foot drop.  Her foot drop is longstanding.  She has tried and failed physical therapy.  She is recovering from her pelvic injuries.  I'd like to give her some time to recover before considering any intervention on her back.  She might benefit from a lumbar decompression with L2-5 decompression.  Though she has an L3-4 anterolisthesis, I would not recommend a larger surgery with instrumented fusion.  I will review with the patient and her daughter at her next follow up.   I spent a total of 15 minutes in face-to-face and non-face-to-face activities related to this patient's care today.  Thank you for involving me in the care of this patient.      Laura Mcmenamin K. Izora Ribas MD, [facility] Neurosurgery

## 2022-04-03 NOTE — Progress Notes (Signed)
PROVIDER NOTE: Information contained herein reflects review and annotations entered in association with encounter. Interpretation of such information and data should be left to medically-trained personnel. Information provided to patient can be located elsewhere in the medical record under "Patient Instructions". Document created using STT-dictation technology, any transcriptional errors that may result from process are unintentional.    Patient: Laura [name]  Service Category: E/M  Provider: Gaspar Cola, MD  DOB: 02/14/1945  DOS: 04/04/2022  Referring Provider: Adin Hector, MD  MRN: [id number]  Specialty: Interventional Pain Management  PCP: Laura Hector, MD  Type: Established Patient  Setting: Ambulatory outpatient    Location: Office  Delivery: Face-to-face     HPI  [name], a 77 y.o. year old female, is here today because of her No primary diagnosis found.. [name]'s primary complain today is No chief complaint on file.  Pertinent problems: 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); Lumbar foraminal stenosis (Bilateral L3-4 and L5-S1); Chronic hip pain (Left); Osteoarthritis of knee (Right); Arthritis; Fibromyalgia syndrome; Chronic arthralgias of knees and hips (Right); Chronic hand pain (Left); Dropfoot (Left); Weakness of foot (Left); Sciatica (Left); Weakness of leg (Left); Chronic lower extremity pain (2ry area of Pain) (Bilateral); Lumbosacral radiculopathy at L5 (Left); Abnormal MRI, lumbar spine  (04/30/2021); DDD (degenerative disc disease), lumbosacral; Statin myopathy; Foot drop (Left); Ligamentum flavum hypertrophy (L1-2, L2-3, L3-4); Lumbar lateral recess stenosis (Bilateral) (L2-3, L3-4, L4-5) (Severe: L3-4); Lumbar Grade 1 Anterolisthesis of L3/L4 and L4/L5; Intractable back pain; Generalized osteoarthritis of multiple sites; and Chronic low back pain (1ry area of Pain) (Bilateral) (L>R) w/o sciatica on their pertinent problem list. Pain Assessment: Severity of   is reported as a  /10. Location:    / . Onset:  . Quality:  . Timing:  . Modifying factor(s):  Marland Kitchen Vitals:  vitals were not taken for this visit.  BMI: Estimated body mass index is 39.94 kg/m as calculated from the following:   Height as of 03/29/22: 5\' 5"  (1.651 m).   Weight as of 03/29/22: 240 lb (108.9 kg). Last encounter: 12/22/2021. Last procedure: 10/21/2021.  Reason for encounter:  *** . ***  Pharmacotherapy Assessment  Analgesic: Oxycodone IR 5 mg every 6 hours (20 mg/day) MME/day: 30 mg/day.   Monitoring: [place] PMP: PDMP reviewed during this encounter.       Pharmacotherapy: No side-effects or adverse reactions reported. Compliance: No problems identified. Effectiveness: Clinically acceptable.  No notes on file  No results found for: "CBDTHCR" No results found for: "D8THCCBX" No results found for: "D9THCCBX"  UDS:  Summary  Date Value Ref Range Status  06/22/2021 Note  Final    Comment:    ==================================================================== [employer] Select 13 (MW) ==================================================================== Test                             Result       Flag       Units  Drug Present and Declared for Prescription Verification   Oxycodone  1634         EXPECTED   ng/mg creat   Oxymorphone                    770          EXPECTED   ng/mg creat   Noroxycodone                   2933         EXPECTED   ng/mg creat   Noroxymorphone                  191          EXPECTED   ng/mg creat    Sources of oxycodone are scheduled prescription medications.    Oxymorphone, noroxycodone, and noroxymorphone are expected    metabolites of oxycodone. Oxymorphone is also available as a    scheduled prescription medication.  ==================================================================== Test                      Result    Flag   Units      Ref Range   Creatinine              94               mg/dL      >=20 ==================================================================== Declared Medications:  The flagging and interpretation on this report are based on the  following declared medications.  Unexpected results may arise from  inaccuracies in the declared medications.   **Note: The testing scope of this panel includes these medications:   Oxycodone (OxyIR)   **Note: The testing scope of this panel does not include the  following reported medications:   Albuterol (Ventolin HFA)  Aspirin  Azelastine (Astelin)  Duloxetine (Cymbalta)  Folic Acid  Furosemide (Lasix)  Hydroxychloroquine (Plaquenil)  Hydroxyzine (Vistaril)  Infliximab (Remicade)  Levothyroxine (Synthroid)  Loratadine (Claritin)  Methotrexate  Metoprolol (Toprol)  Multivitamin  Ondansetron (Zofran)  Rosuvastatin (Crestor)  Spironolactone (Aldactone)  Trazodone (Desyrel)  Vitamin D ==================================================================== For clinical consultation, please call [phone]. ====================================================================       ROS  Constitutional: Denies any fever or chills Gastrointestinal: No reported hemesis, hematochezia, vomiting, or acute GI distress Musculoskeletal: Denies any acute onset joint swelling, redness, loss of ROM, or weakness Neurological: No reported episodes of acute onset apraxia, aphasia, dysarthria, agnosia, amnesia, paralysis, loss of coordination, or loss of consciousness  Medication  Review  DULoxetine, Oxycodone HCl, Vitamin D (Ergocalciferol), albuterol, ascorbic acid, aspirin EC, calcium carbonate, docusate sodium, doxepin, folic acid, furosemide, hydroxychloroquine, inFLIXimab, levothyroxine, loratadine, methotrexate, metoprolol succinate, naloxone, oxyCODONE, potassium chloride, and rosuvastatin  History Review  Allergy: [name] is allergic to bupropion, jardiance [empagliflozin], and penicillins. Drug: [name]  reports no history of drug use. Alcohol:  reports no history of alcohol use. Tobacco:  reports that she has never smoked. She has never used smokeless tobacco. Social: [name]  reports that she has never smoked. She has never used smokeless tobacco. She reports that she does not drink alcohol and does not use drugs. Medical:  has a past medical history of Allergy, Anxiety, Arthritis, degenerative (10/08/2013), Asthma, Chronic hand pain, left (10/10/2019), Chronic kidney disease, Depression, Lumbar spinal stenosis with neurogenic claudication (11/07/2014), Major depression, single episode, in complete remission ([place]) (06/25/2015), Memory loss, short term (03/19/2014), Rheumatoid arthritis ([place]), Sciatica of left side (12/31/2019), Seizure ([place]) (10/07/2014), Sleep apnea, Sleep apnea, and Thyroid disease. Surgical: Ms. Thorner  has a past surgical history that includes Abdominal hysterectomy; Cesarean section; Replacement total knee (Left); Hip surgery (Right); and Orif pelvic fracture with percutaneous screws (Bilateral, 02/02/2022). Family: family history includes Alcohol abuse in her father; Depression in her father and sister; Heart attack in her father; Hypertension in her mother and sister; Post-traumatic stress disorder in her father; Rheum arthritis in her sister; Stroke in her mother.  Laboratory Chemistry Profile   Renal Lab Results  Component Value Date   BUN 31 (H) 02/04/2022   CREATININE 1.10 (H) 02/04/2022   GFRAA 57 (L) 10/03/2019    GFRNONAA 52 (L) 02/04/2022    Hepatic Lab Results  Component Value Date   AST 13 (L) 02/04/2022   ALT 6 02/04/2022   ALBUMIN 2.7 (L) 02/04/2022   ALKPHOS 78 02/04/2022   LIPASE 22 01/14/2021    Electrolytes Lab Results  Component Value Date   NA 136 02/04/2022   K 4.0 02/04/2022   CL 96 (L) 02/04/2022   CALCIUM 8.3 (L) 02/04/2022   MG 2.3 10/08/2018    Bone Lab Results  Component Value Date   VD25OH See Scanned report in [place] 02/03/2022   25OHVITD1 36 07/15/2015   25OHVITD2 3.6 07/15/2015   25OHVITD3 32 07/15/2015    Inflammation (CRP: Acute Phase) (ESR: Chronic Phase) Lab Results  Component Value Date   CRP 1.9 (H) 07/15/2015   ESRSEDRATE 32 (H) 07/15/2015   LATICACIDVEN 1.4 01/31/2022         Note: Above Lab results reviewed.  Recent Imaging Review  DG Pelvis Comp Min 3V CLINICAL DATA:  [id number] with sacral fracture noted on CT.  EXAM: JUDET PELVIS - 3+ VIEW  COMPARISON:  Intraoperative spot fluoroscopic radiographs during iliosacral screw fixation earlier today.  FINDINGS: Judet pelvic views again demonstrate a single iliosacral screw fixation. Several incomplete clefts in the screw are again shown. Positioning and alignment are unchanged.  There is osteopenia, old right hip nailing and advanced bilateral degenerative hip arthrosis with enthesopathic change of the pelvis.  IMPRESSION: No interval change in the appearance of the iliosacral screw fixation. Several incomplete clefts in the screw are again shown.  Electronically Signed   By: Telford Nab M.D.   On: 02/03/2022 05:29 Note: Reviewed        Physical Exam  General appearance: Well nourished, well developed, and well hydrated. In no apparent acute distress Mental status: Alert, oriented x 3 (person, place, & time)       Respiratory: No evidence of acute respiratory distress Eyes: PERLA Vitals: There were no vitals taken for this visit. BMI: Estimated body mass index is 39.94  kg/m as calculated from the following:   Height as of 03/29/22: 5\' 5"  (1.651 m).   Weight as of 03/29/22: 240 lb (108.9 kg). Ideal: Ideal body weight: 57 kg (125 lb 10.6 oz) Adjusted ideal body weight: 77.7 kg (171 lb 6.4 oz)  Assessment   Diagnosis Status  No diagnosis found. Controlled Controlled Controlled   Updated Problems: No problems updated.  Plan of Care  Problem-specific:  No problem-specific Assessment & Plan notes found for this encounter.  [name] has a current medication list which includes the following long-term medication(s): albuterol, doxepin, duloxetine, infliximab, loratadine, metoprolol succinate, oxycodone, oxycodone, and oxycodone hcl.  Pharmacotherapy (Medications Ordered): No orders of the defined types were placed in this encounter.  Orders:  No orders of the defined types were placed in this encounter.  Follow-up plan:   No follow-ups on  file.      Interventional Therapies  Risk  Complexity Considerations:   Estimated body mass index is 38.27 kg/m as calculated from the following:   Height as of 12/22/20: 5\' 5"  (1.651 m).   Weight as of 12/22/20: 230 lb (104.3 kg). PLAQUENIL Anticoagulation (Stop: 11 days)   Planned  Pending:   Therapeutic midline L2-3 LESI #4    Under consideration:   Diagnostic bilateral genicular NB  Diagnostic bilateral L3 and/or L5 TFESI   Completed:   Therapeutic right IA steroid knee injection x2 (10/15/2015)  Therapeutic right Hyalgan knee injection x1 (09/01/2016)  Therapeutic left lumbar facet block x1 (01/22/2015)  Therapeutic left IA hip injection x1 (03/17/2015)  Therapeutic left L4-5 LESI x1 (02/04/2020) (100/100/85/85)  Therapeutic midline L2-3 LESI x3 (12/22/2020) (95/95/95/95)    Therapeutic  Palliative (PRN) options:   Therapeutic right IA Hyalgan knee injections Palliative left IA hip joint injection  Diagnostic left lumbar facet MBB #2           Recent Visits No visits were  found meeting these conditions. Showing recent visits within past 90 days and meeting all other requirements Future Appointments Date Type Provider Dept  04/04/22 Appointment Milinda Pointer, MD [facility]  Showing future appointments within next 90 days and meeting all other requirements  I discussed the assessment and treatment plan with the patient. The patient was provided an opportunity to ask questions and all were answered. The patient agreed with the plan and demonstrated an understanding of the instructions.  Patient advised to call back or seek an in-person evaluation if the symptoms or condition worsens.  Duration of encounter: *** minutes.  Total time on encounter, as per AMA guidelines included both the face-to-face and non-face-to-face time personally spent by the physician and/or other qualified health care professional(s) on the day of the encounter (includes time in activities that require the physician or other qualified health care professional and does not include time in activities normally performed by clinical staff). Physician's time may include the following activities when performed: Preparing to see the patient (e.g., pre-charting review of records, searching for previously ordered imaging, lab work, and nerve conduction tests) Review of prior analgesic pharmacotherapies. Reviewing PMP Interpreting ordered tests (e.g., lab work, imaging, nerve conduction tests) Performing post-procedure evaluations, including interpretation of diagnostic procedures Obtaining and/or reviewing separately obtained history Performing a medically appropriate examination and/or evaluation Counseling and educating the patient/family/caregiver Ordering medications, tests, or procedures Referring and communicating with other health care professionals (when not separately reported) Documenting clinical information in the electronic or other health record Independently  interpreting results (not separately reported) and communicating results to the patient/ family/caregiver Care coordination (not separately reported)  Note by: Laura Cola, MD Date: 04/04/2022; Time: 6:38 PM

## 2022-04-04 ENCOUNTER — Telehealth: Payer: Self-pay | Admitting: *Deleted

## 2022-04-04 ENCOUNTER — Encounter: Payer: Self-pay | Admitting: Pain Medicine

## 2022-04-04 ENCOUNTER — Ambulatory Visit: Payer: Medicare Other | Attending: Pain Medicine | Admitting: Pain Medicine

## 2022-04-04 VITALS — BP 132/49 | HR 67 | Temp 97.0°F | Resp 18 | Ht 65.0 in | Wt 240.0 lb

## 2022-04-04 DIAGNOSIS — M25561 Pain in right knee: Secondary | ICD-10-CM | POA: Diagnosis present

## 2022-04-04 DIAGNOSIS — Z79891 Long term (current) use of opiate analgesic: Secondary | ICD-10-CM | POA: Insufficient documentation

## 2022-04-04 DIAGNOSIS — G894 Chronic pain syndrome: Secondary | ICD-10-CM | POA: Diagnosis not present

## 2022-04-04 DIAGNOSIS — M2428 Disorder of ligament, vertebrae: Secondary | ICD-10-CM | POA: Diagnosis present

## 2022-04-04 DIAGNOSIS — Z79899 Other long term (current) drug therapy: Secondary | ICD-10-CM | POA: Diagnosis present

## 2022-04-04 DIAGNOSIS — M0600 Rheumatoid arthritis without rheumatoid factor, unspecified site: Secondary | ICD-10-CM | POA: Insufficient documentation

## 2022-04-04 DIAGNOSIS — M47816 Spondylosis without myelopathy or radiculopathy, lumbar region: Secondary | ICD-10-CM | POA: Insufficient documentation

## 2022-04-04 DIAGNOSIS — M5137 Other intervertebral disc degeneration, lumbosacral region: Secondary | ICD-10-CM | POA: Insufficient documentation

## 2022-04-04 DIAGNOSIS — M4316 Spondylolisthesis, lumbar region: Secondary | ICD-10-CM | POA: Insufficient documentation

## 2022-04-04 DIAGNOSIS — M48062 Spinal stenosis, lumbar region with neurogenic claudication: Secondary | ICD-10-CM | POA: Diagnosis not present

## 2022-04-04 DIAGNOSIS — M25552 Pain in left hip: Secondary | ICD-10-CM | POA: Diagnosis present

## 2022-04-04 DIAGNOSIS — M79604 Pain in right leg: Secondary | ICD-10-CM | POA: Diagnosis present

## 2022-04-04 DIAGNOSIS — M79642 Pain in left hand: Secondary | ICD-10-CM | POA: Diagnosis present

## 2022-04-04 DIAGNOSIS — M159 Polyosteoarthritis, unspecified: Secondary | ICD-10-CM | POA: Insufficient documentation

## 2022-04-04 DIAGNOSIS — M79605 Pain in left leg: Secondary | ICD-10-CM | POA: Insufficient documentation

## 2022-04-04 DIAGNOSIS — M5442 Lumbago with sciatica, left side: Secondary | ICD-10-CM | POA: Diagnosis present

## 2022-04-04 DIAGNOSIS — G8929 Other chronic pain: Secondary | ICD-10-CM | POA: Diagnosis present

## 2022-04-04 DIAGNOSIS — M48061 Spinal stenosis, lumbar region without neurogenic claudication: Secondary | ICD-10-CM | POA: Diagnosis present

## 2022-04-04 DIAGNOSIS — M5441 Lumbago with sciatica, right side: Secondary | ICD-10-CM | POA: Insufficient documentation

## 2022-04-04 MED ORDER — OXYCODONE HCL 5 MG PO TABS: 5.0000 mg | ORAL_TABLET | Freq: Four times a day (QID) | ORAL | 0 refills | Status: DC | PRN

## 2022-04-04 NOTE — Telephone Encounter (Signed)
Cancelled two prescriptions for Oxycodone from 12/2021.

## 2022-04-04 NOTE — Progress Notes (Signed)
Nursing Pain Medication Assessment:  Safety precautions to be maintained throughout the outpatient stay will include: orient to surroundings, keep bed in low position, maintain call bell within reach at all times, provide assistance with transfer out of bed and ambulation.  Medication Inspection Compliance: Pill count conducted under aseptic conditions, in front of the patient. Neither the pills nor the bottle was removed from the patient's sight at any time. Once count was completed pills were immediately returned to the patient in their original bottle.  Medication: Oxycodone IR Pill/Patch Count: 14.5 pill in the bottle Pill/Patch Appearance: Markings consistent with prescribed medication Bottle Appearance: Standard pharmacy container. Clearly labeled. Filled Date: Last Medication intake:  Today  The tablets brought in are 10 mg Oxycodone, prescribed by surgeon, as reported by patient's daughter. They are broken in half. She reports that there are no remaining 5 mg tablets from Dr. Dossie Arbour. She also reports that there are a few more tabs at home in the pill pack.

## 2022-04-04 NOTE — Patient Instructions (Signed)
____________________________________________________________________________________________  Opioid Pain Medication Update  To: All patients taking opioid pain medications. (I.e.: hydrocodone, hydromorphone, oxycodone, oxymorphone, morphine, codeine, methadone, tapentadol, tramadol, buprenorphine, fentanyl, etc.)  Re: Updated review of side effects and adverse reactions of opioid analgesics, as well as new information about long term effects of this class of medications.  Direct risks of long-term opioid therapy are not limited to opioid addiction and overdose. Potential medical risks include serious fractures, breathing problems during sleep, hyperalgesia, immunosuppression, chronic constipation, bowel obstruction, myocardial infarction, and tooth decay secondary to xerostomia.  Unpredictable adverse effects that can occur even if you take your medication correctly: Cognitive impairment, respiratory depression, and death. Most people think that if they take their medication "correctly", and "as instructed", that they will be safe. Nothing could be farther from the truth. In reality, a significant amount of recorded deaths associated with the use of opioids has occurred in individuals that had taken the medication for a long time, and were taking their medication correctly. The following are examples of how this can happen: Patient taking his/her medication for a long time, as instructed, without any side effects, is given a certain antibiotic or another unrelated medication, which in turn triggers a "Drug-to-drug interaction" leading to disorientation, cognitive impairment, impaired reflexes, respiratory depression or an untoward event leading to serious bodily harm or injury, including death.  Patient taking his/her medication for a long time, as instructed, without any side effects, develops an acute impairment of liver and/or kidney function. This will lead to a rapid inability of the body to  breakdown and eliminate their pain medication, which will result in effects similar to an "overdose", but with the same medicine and dose that they had always taken. This again may lead to disorientation, cognitive impairment, impaired reflexes, respiratory depression or an untoward event leading to serious bodily harm or injury, including death.  A similar problem will occur with patients as they grow older and their liver and kidney function begins to decrease as part of the aging process.  Background information: Historically, the original case for using long-term opioid therapy to treat chronic noncancer pain was based on safety assumptions that subsequent experience has called into question. In 1996, the American Pain Society and the [place] [employer] issued a consensus statement supporting long-term opioid therapy. This statement acknowledged the dangers of opioid prescribing but concluded that the risk for addiction was low; respiratory depression induced by opioids was short-lived, occurred mainly in opioid-naive patients, and was antagonized by pain; tolerance was not a common problem; and efforts to control diversion should not constrain opioid prescribing. This has now proven to be wrong. Experience regarding the risks for opioid addiction, misuse, and overdose in community practice has failed to support these assumptions.  According to the Centers for Disease Control and Prevention, fatal overdoses involving opioid analgesics have increased sharply over the past decade. Currently, more than 96,700 people die from drug overdoses every year. Opioids are a factor in 7 out of every 10 overdose deaths. Deaths from drug overdose have surpassed motor vehicle accidents as the leading cause of death for individuals between the ages of 3 and 36.  Clinical data suggest that neuroendocrine dysfunction may be very common in both men and women, potentially causing hypogonadism, erectile  dysfunction, infertility, decreased libido, osteoporosis, and depression. Recent studies linked higher opioid dose to increased opioid-related mortality. Controlled observational studies reported that long-term opioid therapy may be associated with increased risk for cardiovascular events. Subsequent meta-analysis concluded  that the safety of long-term opioid therapy in elderly patients has not been proven.   Side Effects and adverse reactions: Common side effects: Drowsiness (sedation). Dizziness. Nausea and vomiting. Constipation. Physical dependence -- Dependence often manifests with withdrawal symptoms when opioids are discontinued or decreased. Tolerance -- As you take repeated doses of opioids, you require increased medication to experience the same effect of pain relief. Respiratory depression -- This can occur in healthy people, especially with higher doses. However, people with COPD, asthma or other lung conditions may be even more susceptible to fatal respiratory impairment.  Uncommon side effects: An increased sensitivity to feeling pain and extreme response to pain (hyperalgesia). Chronic use of opioids can lead to this. Delayed gastric emptying (the process by which the contents of your stomach are moved into your small intestine). Muscle rigidity. Immune system and hormonal dysfunction. Quick, involuntary muscle jerks (myoclonus). Arrhythmia. Itchy skin (pruritus). Dry mouth (xerostomia).  Long-term side effects: Chronic constipation. Sleep-disordered breathing (SDB). Increased risk of bone fractures. Hypothalamic-pituitary-adrenal dysregulation. Increased risk of overdose.  RISKS: Fractures and Falls:  Opioids increase the risk and incidence of falls. This is of particular importance in elderly patients.  Endocrine System:  Long-term administration is associated with endocrine abnormalities (endocrinopathies). (Also known as Opioid-induced Endocrinopathy) Influences  on both the hypothalamic-pituitary-adrenal axis?and the hypothalamic-pituitary-gonadal axis have been demonstrated with consequent hypogonadism and adrenal insufficiency in both sexes. Hypogonadism and decreased levels of dehydroepiandrosterone sulfate have been reported in men and women. Endocrine effects include: Amenorrhoea in women (abnormal absence of menstruation) Reduced libido in both sexes Decreased sexual function Erectile dysfunction in men Hypogonadisms (decreased testicular function with shrinkage of testicles) Infertility Depression and fatigue Loss of muscle mass Anxiety Depression Immune suppression Hyperalgesia Weight gain Anemia Osteoporosis Patients (particularly women of childbearing age) should avoid opioids. There is insufficient evidence to recommend routine monitoring of asymptomatic patients taking opioids in the long-term for hormonal deficiencies.  Immune System: Human studies have demonstrated that opioids have an immunomodulating effect. These effects are mediated via opioid receptors both on immune effector cells and in the central nervous system. Opioids have been demonstrated to have adverse effects on antimicrobial response and anti-tumour surveillance. Buprenorphine has been demonstrated to have no impact on immune function.  Opioid Induced Hyperalgesia: Human studies have demonstrated that prolonged use of opioids can lead to a state of abnormal pain sensitivity, sometimes called opioid induced hyperalgesia (OIH). Opioid induced hyperalgesia is not usually seen in the absence of tolerance to opioid analgesia. Clinically, hyperalgesia may be diagnosed if the patient on long-term opioid therapy presents with increased pain. This might be qualitatively and anatomically distinct from pain related to disease progression or to breakthrough pain resulting from development of opioid tolerance. Pain associated with hyperalgesia tends to be more diffuse than the  pre-existing pain and less defined in quality. Management of opioid induced hyperalgesia requires opioid dose reduction.  Cancer: Chronic opioid therapy has been associated with an increased risk of cancer among noncancer patients with chronic pain. This association was more evident in chronic strong opioid users. Chronic opioid consumption causes significant pathological changes in the small intestine and colon. Epidemiological studies have found that there is a link between opium dependence and initiation of gastrointestinal cancers. Cancer is the second leading cause of death after cardiovascular disease. Chronic use of opioids can cause multiple conditions such as GERD, immunosuppression and renal damage as well as carcinogenic effects, which are associated with the incidence of cancers.   Mortality: Long-term opioid use   has been associated with increased mortality among patients with chronic non-cancer pain (CNCP).  Prescription of long-acting opioids for chronic noncancer pain was associated with a significantly increased risk of all-cause mortality, including deaths from causes other than overdose.  Reference: Von Korff M, Kolodny A, Deyo RA, Chou R. Long-term opioid therapy reconsidered. Ann Intern Med. 2011 Sep 6;155(5):325-8. doi: 10.7326/[id number]. PMID: [id number]; PMCIDXX:1631110. Morley Kos, Hayward RA, Dunn KM, [place] KP. Risk of adverse events in patients prescribed long-term opioids: A cohort study in the [place] Clinical Practice Research [employer]. Eur J Pain. 2019 May;23(5):908-922. doi: 10.1002/ejp.1357. Epub 2019 Jan 31. PMID: [id number]. Colameco S, Coren JS, Ciervo CA. Continuous opioid treatment for chronic noncancer pain: a time for moderation in prescribing. Postgrad Med. 2009 Jul;121(4):61-6. doi: 10.3810/pgm.2009.07.2032. PMID: [id number]. Heywood Bene RN, [place] SD, Blazina I, Rosalio Loud, Bougatsos C, Deyo RA. The  effectiveness and risks of long-term opioid therapy for chronic pain: a systematic review for a [employer] of Health Pathways to [employer]. Ann Intern Med. 2015 Feb 17;162(4):276-86. doi: [id number]. PMID: [id number]. Marjory Sneddon [facility], [place] DM. NCHS Data Brief No. 22. [place]: Centers for Disease Control and Prevention; 2009. Sep, Increase in Fatal Poisonings Involving Opioid Analgesics in the [place], 1999-2006. Song IA, Choi HR, Oh TK. Long-term opioid use and mortality in patients with chronic non-cancer pain: Ten-year follow-up study in [place] from 2010 through 2019. EClinicalMedicine. 2022 Jul 18;51:101558. doi: 10.1016/j.eclinm.2022.UB:5887891. PMID: [id number]; PMCIDOX:8550940. Huser, W., Schubert, T., Vogelmann, T. et al. All-cause mortality in patients with long-term opioid therapy compared with non-opioid analgesics for chronic non-cancer pain: a database study. [place] Med 18, 162 (2020). [email or web address] Rashidian H, Roxy Cedar, Malekzadeh R, Haghdoost AA. An Ecological Study of the Association between Opiate Use and Incidence of Cancers. Addict Health. 2016 Fall;8(4):252-260. PMID: [id number]; PMCIDQI:9185013.  Our Goal: Our goal is to control your pain with means other than the use of opioid pain medications.  Our Recommendation: Talk to your physician about coming off of these medications. We can assist you with the tapering down and stopping these medicines. Based on the new information, even if you cannot completely stop the medication, a decrease in the dose may be associated with a lesser risk. Ask for other means of controlling the pain. Decrease or eliminate those factors that significantly contribute to your pain such as smoking, obesity, and a diet heavily tilted towards "inflammatory" nutrients.  Last Updated: 03/09/2022    ____________________________________________________________________________________________     ____________________________________________________________________________________________  Patient Information update  To: All of our patients.  Re: Name change.  It has been made official that our current name, "[facility]"   will soon be changed to "[facility]".   The purpose of this change is to eliminate any confusion created by the concept of our practice being a "Medication Management Pain Clinic". In the past this has led to the misconception that we treat pain primarily by the use of prescription medications.  Nothing can be farther from the truth.   Understanding PAIN MANAGEMENT: To further understand what our practice does, you first have to understand that "Pain Management" is a subspecialty that requires additional training once a physician has completed their specialty training, which can be in either Anesthesia, Neurology, Psychiatry, or Physical Medicine and Rehabilitation (PMR). Each one of these contributes to the final approach taken by each physician to  the management of their patient's pain. To be a "Pain Management Specialist" you must have first completed one of the specialty trainings below.  Anesthesiologists - trained in clinical pharmacology and interventional techniques such as nerve blockade and regional as well as central neuroanatomy. They are trained to block pain before, during, and after surgical interventions.  Neurologists - trained in the diagnosis and pharmacological treatment of complex neurological conditions, such as Multiple Sclerosis, Parkinson's, spinal cord injuries, and other systemic conditions that may be associated with symptoms that may include but are not limited to pain. They tend to rely primarily on the treatment of chronic pain  using prescription medications.  Psychiatrist - trained in conditions affecting the psychosocial wellbeing of patients including but not limited to depression, anxiety, schizophrenia, personality disorders, addiction, and other substance use disorders that may be associated with chronic pain. They tend to rely primarily on the treatment of chronic pain using prescription medications.   Physical Medicine and Rehabilitation (PMR) physicians, also known as physiatrists - trained to treat a wide variety of medical conditions affecting the brain, spinal cord, nerves, bones, joints, ligaments, muscles, and tendons. Their training is primarily aimed at treating patients that have suffered injuries that have caused severe physical impairment. Their training is primarily aimed at the physical therapy and rehabilitation of those patients. They may also work alongside orthopedic surgeons or neurosurgeons using their expertise in assisting surgical patients to recover after their surgeries.  INTERVENTIONAL PAIN MANAGEMENT is sub-subspecialty of Pain Management.  Our physicians are Board-certified in Anesthesia, Pain Management, and Interventional Pain Management.  This meaning that not only have they been trained and Board-certified in their specialty of Anesthesia, and subspecialty of Pain Management, but they have also received further training in the sub-subspecialty of Interventional Pain Management, in order to become Board-certified as INTERVENTIONAL PAIN MANAGEMENT SPECIALIST.    Mission: Our goal is to use our skills in  [place] as alternatives to the chronic use of prescription opioid medications for the treatment of pain. To make this more clear, we have changed our name to reflect what we do and offer. We will continue to offer medication management assessment and recommendations, but we will not be taking over any patient's medication  management.  ____________________________________________________________________________________________     ____________________________________________________________________________________________  National Pain Medication Shortage  The [place] is experiencing worsening drug shortages. These have had a negative widespread effect on patient care and treatment. Not expected to improve any time soon. Predicted to last past 2029.   Drug shortage list (generic names) Oxycodone IR Oxycodone/APAP Oxymorphone IR Hydromorphone Hydrocodone/APAP Morphine  Where is the problem?  Manufacturing and supply level.  Will this shortage affect you?  Only if you take any of the above pain medications.  How? You may be unable to fill your prescription.  Your pharmacist may offer a "partial fill" of your prescription. (Warning: Do not accept partial fills.) Prescriptions partially filled cannot be transferred to another pharmacy. Read our Medication Rules and Regulation. Depending on how much medicine you are dependent on, you may experience withdrawals when unable to get the medication.  Recommendations: Consider ending your dependence on opioid pain medications. Ask your pain specialist to assist you with the process. Consider switching to a medication currently not in shortage, such as Buprenorphine. Talk to your pain specialist about this option. Consider decreasing your pain medication requirements by managing tolerance thru "Drug Holidays". This may help minimize withdrawals, should you run out of medicine. Control your pain thru  the use of non-pharmacological interventional therapies.   Your prescriber: Prescribers cannot be blamed for shortages. Medication manufacturing and supply issues cannot be fixed by the prescriber.   NOTE: The prescriber is not responsible for supplying the medication, or solving supply issues. Work with your pharmacist to solve it. The patient is responsible for  the decision to take or continue taking the medication and for identifying and securing a legal supply source. By law, supplying the medication is the job and responsibility of the pharmacy. The prescriber is responsible for the evaluation, monitoring, and prescribing of these medications.   Prescribers will NOT: Re-issue prescriptions that have been partially filled. Re-issue prescriptions already sent to a pharmacy.  Re-send prescriptions to a different pharmacy because yours did not have your medication. Ask pharmacist to order more medicine or transfer the prescription to another pharmacy. (Read below.)  New 2023 regulation: "September 10, 2021 Revised Regulation Allows DEA-Registered Pharmacies to Transfer Electronic Prescriptions at a Patient's Request [place] Patients now have the ability to request their electronic prescription be transferred to another pharmacy without having to go back to their practitioner to initiate the request. This revised regulation went into effect on Monday, September 06, 2021.     At a patient's request, a DEA-registered retail pharmacy can now transfer an electronic prescription for a controlled substance (schedules II-V) to another DEA-registered retail pharmacy. Prior to this change, patients would have to go through their practitioner to cancel their prescription and have it re-issued to a different pharmacy. The process was taxing and time consuming for both patients and practitioners.    The Drug Enforcement Administration [facility]) published its intent to revise the process for transferring electronic prescriptions on November 29, 2019.  The final rule was published in the federal register on August 05, 2021 and went into effect 30 days later.  Under the final rule, a prescription can only be transferred once between pharmacies, and only if allowed under existing state or other applicable law. The prescription must  remain in its electronic form; may not be altered in any way; and the transfer must be communicated directly between two licensed pharmacists. It's important to note, any authorized refills transfer with the original prescription, which means the entire prescription will be filled at the same pharmacy".  Reference: [employer] [facility] website announcement)  [email or web address].pdf ([place])   [employer] / Vol. 88, No. 143 / Thursday, August 05, 2021 / Rules and Regulations DEPARTMENT OF JUSTICE  Drug Enforcement Administration  21 CFR Part 1306  [Docket No. [id number]]  RIN [id number] Transfer of Electronic Prescriptions for Schedules II-V Controlled Substances Between Pharmacies for Initial Filling  ____________________________________________________________________________________________     ____________________________________________________________________________________________  Transfer of Pain Medication between Pharmacies  Re: 2023 DEA Clarification on existing regulation  Published on DEA Website: September 10, 2021  Title: Revised Regulation Allows DEA-Registered Pharmacies to Conservator, museum/gallery Prescriptions at a Patient's Request [place]  "Patients now have the ability to request their electronic prescription be transferred to another pharmacy without having to go back to their practitioner to initiate the request. This revised regulation went into effect on Monday, September 06, 2021.     At a patient's request, a DEA-registered retail pharmacy can now transfer an electronic prescription for a controlled substance (schedules II-V) to another DEA-registered retail pharmacy. Prior to this change, patients would have to go through their practitioner to  cancel their prescription  and have it re-issued to a different pharmacy. The process was taxing and time consuming for both patients and practitioners.    The Drug Enforcement Administration (DEA) published its intent to revise the process for transferring electronic prescriptions on November 29, 2019.  The final rule was published in the federal register on August 05, 2021 and went into effect 30 days later.  Under the final rule, a prescription can only be transferred once between pharmacies, and only if allowed under existing state or other applicable law. The prescription must remain in its electronic form; may not be altered in any way; and the transfer must be communicated directly between two licensed pharmacists. It's important to note, any authorized refills transfer with the original prescription, which means the entire prescription will be filled at the same pharmacy."    REFERENCES: 1. DEA website announcement https://www.dea.gov/stories/2023/2023-09/2021-09-01/revised-regulation-allows-dea-registered-pharmacies-transfer  2. Department of Justice website  https://www.govinfo.gov/content/pkg/FR-2021-08-05/pdf/2023-15847.pdf  3. DEPARTMENT OF JUSTICE Drug Enforcement Administration 21 CFR Part 1306 [Docket No. [id number]] RIN [id number] "Transfer of Electronic Prescriptions for Schedules II-V Controlled Substances Between Pharmacies for Initial Filling"  ____________________________________________________________________________________________     _______________________________________________________________________  Medication Rules  Purpose: To inform patients, and their family members, of our medication rules and regulations.  Applies to: All patients receiving prescriptions from our practice (written or electronic).  Pharmacy of record: This is the pharmacy where your electronic prescriptions will be sent. Make sure we have the correct one.  Electronic prescriptions: In  compliance with the [place] Strengthen Opioid Misuse Prevention (STOP) Act of 2017 (Session Law 2017-74/H243), effective January 10, 2018, all controlled substances must be electronically prescribed. Written prescriptions, faxing, or calling prescriptions to a pharmacy will no longer be done.  Prescription refills: These will be provided only during in-person appointments. No medications will be renewed without a "face-to-face" evaluation with your provider. Applies to all prescriptions.  NOTE: The following applies primarily to controlled substances (Opioid* Pain Medications).   Type of encounter (visit): For patients receiving controlled substances, face-to-face visits are required. (Not an option and not up to the patient.)  Patient's responsibilities: Pain Pills: Bring all pain pills to every appointment (except for procedure appointments). Pill Bottles: Bring pills in original pharmacy bottle. Bring bottle, even if empty. Always bring the bottle of the most recent fill.  Medication refills: You are responsible for knowing and keeping track of what medications you are taking and when is it that you will need a refill. The day before your appointment: write a list of all prescriptions that need to be refilled. The day of the appointment: give the list to the admitting nurse. Prescriptions will be written only during appointments. No prescriptions will be written on procedure days. If you forget a medication: it will not be "Called in", "Faxed", or "electronically sent". You will need to get another appointment to get these prescribed. No early refills. Do not call asking to have your prescription filled early. Partial  or short prescriptions: Occasionally your pharmacy may not have enough pills to fill your prescription.  NEVER ACCEPT a partial fill or a prescription that is short of the total amount of pills that you were prescribed.  With controlled substances the law allows 72 hours for  the pharmacy to complete the prescription.  If the prescription is not completed within 72 hours, the pharmacist will require a new prescription to be written. This means that you will be short on your medicine and we WILL NOT send another prescription to complete your original   prescription.  Instead, request the pharmacy to send a carrier to a nearby branch to get enough medication to provide you with your full prescription. Prescription Accuracy: You are responsible for carefully inspecting your prescriptions before leaving our office. Have the discharge nurse carefully go over each prescription with you, before taking them home. Make sure that your name is accurately spelled, that your address is correct. Check the name and dose of your medication to make sure it is accurate. Check the number of pills, and the written instructions to make sure they are clear and accurate. Make sure that you are given enough medication to last until your next medication refill appointment. Taking Medication: Take medication as prescribed. When it comes to controlled substances, taking less pills or less frequently than prescribed is permitted and encouraged. Never take more pills than instructed. Never take the medication more frequently than prescribed.  Inform other Doctors: Always inform, all of your healthcare providers, of all the medications you take. Pain Medication from other Providers: You are not allowed to accept any additional pain medication from any other Doctor or Healthcare provider. There are two exceptions to this rule. (see below) In the event that you require additional pain medication, you are responsible for notifying us, as stated below. Cough Medicine: Often these contain an opioid, such as codeine or hydrocodone. Never accept or take cough medicine containing these opioids if you are already taking an opioid* medication. The combination may cause respiratory failure and death. Medication Agreement:  You are responsible for carefully reading and following our Medication Agreement. This must be signed before receiving any prescriptions from our practice. Safely store a copy of your signed Agreement. Violations to the Agreement will result in no further prescriptions. (Additional copies of our Medication Agreement are available upon request.) Laws, Rules, & Regulations: All patients are expected to follow all Federal and [employer], [employer], Rules, [facility]. Ignorance of the Laws does not constitute a valid excuse.  Illegal drugs and Controlled Substances: The use of illegal substances (including, but not limited to marijuana and its derivatives) and/or the illegal use of any controlled substances is strictly prohibited. Violation of this rule may result in the immediate and permanent discontinuation of any and all prescriptions being written by our practice. The use of any illegal substances is prohibited. Adopted CDC guidelines & recommendations: Target dosing levels will be at or below 60 MME/day. Use of benzodiazepines** is not recommended.  Exceptions: There are only two exceptions to the rule of not receiving pain medications from other Healthcare Providers. Exception #1 (Emergencies): In the event of an emergency (i.e.: accident requiring emergency care), you are allowed to receive additional pain medication. However, you are responsible for: As soon as you are able, call our office [phone], at any time of the day or night, and leave a message stating your name, the date and nature of the emergency, and the name and dose of the medication prescribed. In the event that your call is answered by a member of our staff, make sure to document and save the date, time, and the name of the person that took your information.  Exception #2 (Planned Surgery): In the event that you are scheduled by another doctor or dentist to have any type of surgery or procedure, you are allowed (for a period no  longer than 30 days), to receive additional pain medication, for the acute post-op pain. However, in this case, you are responsible for picking up a copy of  our "Post-op Pain Management for Surgeons" handout, and giving it to your surgeon or dentist. This document is available at our office, and does not require an appointment to obtain it. Simply go to our office during business hours (Monday-Thursday from 8:00 AM to 4:00 PM) (Friday 8:00 AM to 12:00 Noon) or if you have a scheduled appointment with [place], prior to your surgery, and ask for it by name. In addition, you are responsible for: calling our office [phone], at any time of the day or night, and leaving a message stating your name, name of your surgeon, type of surgery, and date of procedure or surgery. Failure to comply with your responsibilities may result in termination of therapy involving the controlled substances. Medication Agreement Violation. Following the above rules, including your responsibilities will help you in avoiding a Medication Agreement Violation ("Breaking your Pain Medication Contract").  Consequences:  Not following the above rules may result in permanent discontinuation of medication prescription therapy.  *Opioid medications include: morphine, codeine, oxycodone, oxymorphone, hydrocodone, hydromorphone, meperidine, tramadol, tapentadol, buprenorphine, fentanyl, methadone. **Benzodiazepine medications include: diazepam (Valium), alprazolam (Xanax), clonazepam (Klonopine), lorazepam (Ativan), clorazepate (Tranxene), chlordiazepoxide (Librium), estazolam (Prosom), oxazepam (Serax), temazepam (Restoril), triazolam (Halcion) (Last updated: 11/02/2021) ______________________________________________________________________    ______________________________________________________________________  Medication Recommendations and Reminders  Applies to: All patients receiving prescriptions (written and/or  electronic).  Medication Rules & Regulations: You are responsible for reading, knowing, and following our "Medication Rules" document. These exist for your safety and that of others. They are not flexible and neither are we. Dismissing or ignoring them is an act of "non-compliance" that may result in complete and irreversible termination of such medication therapy. For safety reasons, "non-compliance" will not be tolerated. As with the [place] fundamental legal principle of "ignorance of the law is no defense", we will accept no excuses for not having read and knowing the content of documents provided to you by our practice.  Pharmacy of record:  Definition: This is the pharmacy where your electronic prescriptions will be sent.  We do not endorse any particular pharmacy. It is up to you and your insurance to decide what pharmacy to use.  We do not restrict you in your choice of pharmacy. However, once we write for your prescriptions, we will NOT be re-sending more prescriptions to fix restricted supply problems created by your pharmacy, or your insurance.  The pharmacy listed in the electronic medical record should be the one where you want electronic prescriptions to be sent. If you choose to change pharmacy, simply notify our nursing staff. Changes will be made only during your regular appointments and not over the phone.  Recommendations: Keep all of your pain medications in a safe place, under lock and key, even if you live alone. We will NOT replace lost, stolen, or damaged medication. We do not accept "Police Reports" as proof of medications having been stolen. After you fill your prescription, take 1 week's worth of pills and put them away in a safe place. You should keep a separate, properly labeled bottle for this purpose. The remainder should be kept in the original bottle. Use this as your primary supply, until it runs out. Once it's gone, then you know that you have 1 week's worth of medicine,  and it is time to come in for a prescription refill. If you do this correctly, it is unlikely that you will ever run out of medicine. To make sure that the above recommendation works, it is very important that you make  sure your medication refill appointments are scheduled at least 1 week before you run out of medicine. To do this in an effective manner, make sure that you do not leave the office without scheduling your next medication management appointment. Always ask the nursing staff to show you in your prescription , when your medication will be running out. Then arrange for the receptionist to get you a return appointment, at least 7 days before you run out of medicine. Do not wait until you have 1 or 2 pills left, to come in. This is very poor planning and does not take into consideration that we may need to cancel appointments due to bad weather, sickness, or emergencies affecting our staff. DO NOT ACCEPT A "Partial Fill": If for any reason your pharmacy does not have enough pills/tablets to completely fill or refill your prescription, do not allow for a "partial fill". The law allows the pharmacy to complete that prescription within 72 hours, without requiring a new prescription. If they do not fill the rest of your prescription within those 72 hours, you will need a separate prescription to fill the remaining amount, which we will NOT provide. If the reason for the partial fill is your insurance, you will need to talk to the pharmacist about payment alternatives for the remaining tablets, but again, DO NOT ACCEPT A PARTIAL FILL, unless you can trust your pharmacist to obtain the remainder of the pills within 72 hours.  Prescription refills and/or changes in medication(s):  Prescription refills, and/or changes in dose or medication, will be conducted only during scheduled medication management appointments. (Applies to both, written and electronic prescriptions.) No refills on procedure days. No  medication will be changed or started on procedure days. No changes, adjustments, and/or refills will be conducted on a procedure day. Doing so will interfere with the diagnostic portion of the procedure. No phone refills. No medications will be "called into the pharmacy". No Fax refills. No weekend refills. No Holliday refills. No after hours refills.  Remember:  Business hours are:  Monday to Thursday 8:00 AM to 4:00 PM Provider's Schedule: Milinda Pointer, MD - Appointments are:  Medication management: Monday and Wednesday 8:00 AM to 4:00 PM Procedure day: Tuesday and Thursday 7:30 AM to 4:00 PM Gillis Santa, MD - Appointments are:  Medication management: Tuesday and Thursday 8:00 AM to 4:00 PM Procedure day: Monday and Wednesday 7:30 AM to 4:00 PM (Last update: 11/02/2021) ______________________________________________________________________    ____________________________________________________________________________________________  Drug Holidays  What is a "Drug Holiday"? Drug Holiday: is the name given to the process of slowly tapering down and temporarily stopping the pain medication for the purpose of decreasing or eliminating tolerance to the drug.  Benefits Improved effectiveness Decreased required effective dose Improved pain control End dependence on high dose therapy Decrease cost of therapy Uncovering "opioid-induced hyperalgesia". (OIH)  What is "opioid hyperalgesia"? It is a paradoxical increase in pain caused by exposure to opioids. Stopping the opioid pain medication, contrary to the expected, it actually decreases or completely eliminates the pain. Ref.: "A comprehensive review of opioid-induced hyperalgesia". Brion Aliment, et.al. Pain Physician. 2011 Mar-Apr;14(2):145-61.  What is tolerance? Tolerance: the progressive loss of effectiveness of a pain medicine due to repetitive use. A common problem of opioid pain medications.  How long should a "Drug  Holiday" last? Effectiveness depends on the patient staying off all opioid pain medicines for a minimum of 14 consecutive days. (2 weeks)  How about just taking less of the medicine? Does not  work. Will not accomplish goal of eliminating the excess receptors.  How about switching to a different pain medicine? (AKA. "Opioid rotation") Does not work. Creates the illusion of effectiveness by taking advantage of inaccurate equivalent dose calculations between different opioids. -This "technique" was promoted by studies funded by [employer], such as [employer], creators of "OxyContin".  Can I stop the medicine "cold [place]"? We do not recommend it. You should always coordinate with your prescribing physician to make the transition as smoothly as possible. Avoid stopping the medicine abruptly without consulting. We recommend a "slow taper".  What is a slow taper? Taper: refers to the gradual decrease in dose.   How do I stop/taper the dose? Slowly. Decrease the daily amount of pills that you take by one (1) pill every seven (7) days. This is called a "slow downward taper". Example: if you normally take four (4) pills per day, drop it to three (3) pills per day for seven (7) days, then to two (2) pills per day for seven (7) days, then to one (1) per day for seven (7) days, and then stop the medicine. The 14 day "Drug Holiday" starts on the first day without medicine.   Will I experience withdrawals? Unlikely with a slow taper.  What triggers withdrawals? Withdrawals are triggered by the sudden/abrupt stop of high dose opioids. Withdrawals can be avoided by slowly decreasing the dose over a prolonged period of time.  What are withdrawals? Symptoms associated with sudden/abrupt reduction/stopping of high-dose, long-term use of pain medication. Withdrawal are seldom seen on low dose therapy, or patients rarely taking opioid medication.  Early Withdrawal Symptoms may  include: Agitation Anxiety Muscle aches Increased tearing Insomnia Runny nose Sweating Yawning  Late symptoms may include: Abdominal cramping Diarrhea Dilated pupils Goose bumps Nausea Vomiting  When could I see withdrawals? Onset: 8-24 hours after last use for most opioids. 12-48 hours for long-acting opioids (i.e.: methadone)  How long could they last? Duration: 4-10 days for most opioids. 14-21 days for long-acting opioids (i.e.: methadone)  What will happen after I complete my "Drug Holiday"? The need and indications for the opioid analgesic will be reviewed before restarting the medication. Dose requirements will likely decrease and the dose will need to be adjusted accordingly.   (Last update: 03/30/2022) ____________________________________________________________________________________________    ____________________________________________________________________________________________  WARNING: CBD (cannabidiol) & Delta (Delta-8 tetrahydrocannabinol) products.   Applicable to:  All individuals currently taking or considering taking CBD (cannabidiol) and, more important, all patients taking opioid analgesic controlled substances (pain medication). (Example: oxycodone; oxymorphone; hydrocodone; hydromorphone; morphine; methadone; tramadol; tapentadol; fentanyl; buprenorphine; butorphanol; dextromethorphan; meperidine; codeine; etc.)  Introduction:  Recently there has been a drive towards the use of "natural" products for the treatment of different conditions, including pain anxiety and sleep disorders. Marijuana and hemp are two varieties of the cannabis genus plants. Marijuana and its derivatives are illegal, while hemp and its derivatives are not. Cannabidiol (CBD) and tetrahydrocannabinol (THC), are two natural compounds found in plants of the Cannabis genus. They can both be extracted from hemp or marijuana. Both compounds interact with your body's endocannabinoid  system in very different ways. CBD is associated with pain relief (analgesia) while THC is associated with the psychoactive effects ("the high") obtained from the use of marijuana products. There are two main types of THC: Delta-9, which comes from the marijuana plant and it is illegal, and Delta-8, which comes from the hemp plant, and it is legal. (Both, Delta-9-THC and Delta-8-THC are psychoactive and  give you "the high".)   Legality:  Marijuana and its derivatives: illegal Hemp and its derivatives: Legal (State dependent) UPDATE: (02/26/2021) The Drug Enforcement Agency ([place]) issued a letter stating that "delta" cannabinoids, including Delta-8-THCO and Delta-9-THCO, synthetically derived from hemp do not qualify as hemp and will be viewed as Schedule I drugs. (Schedule I drugs, substances, or chemicals are defined as drugs with no currently accepted medical use and a high potential for abuse. Some examples of Schedule I drugs are: heroin, lysergic acid diethylamide (LSD), marijuana (cannabis), 3,4-methylenedioxymethamphetamine (ecstasy), methaqualone, and peyote.) (https://jennings.com/)  Legal status of CBD in [place]:  "Conditionally Legal"  Reference: "FDA Regulation of Cannabis and Cannabis-Derived Products, Including Cannabidiol (CBD)" - SeekArtists.com.pt  Warning:  CBD is not FDA approved and has not undergo the same manufacturing controls as prescription drugs.  This means that the purity and safety of available CBD may be questionable. Most of the time, despite manufacturer's claims, it is contaminated with THC (delta-9-tetrahydrocannabinol - the chemical in marijuana responsible for the "HIGH").  When this is the case, the [facility] contaminant will trigger a positive urine drug screen (UDS) test for Marijuana (carboxy-THC).   The FDA recently put out a warning about 5 things that everyone  should be aware of regarding Delta-8 THC: Delta-8 THC products have not been evaluated or approved by the FDA for safe use and may be marketed in ways that put the public health at risk. The FDA has received adverse event reports involving delta-8 THC-containing products. Delta-8 THC has psychoactive and intoxicating effects. Delta-8 THC manufacturing often involve use of potentially harmful chemicals to create the concentrations of delta-8 THC claimed in the marketplace. The final delta-8 THC product may have potentially harmful by-products (contaminants) due to the chemicals used in the process. Manufacturing of delta-8 THC products may occur in uncontrolled or unsanitary settings, which may lead to the presence of unsafe contaminants or other potentially harmful substances. Delta-8 THC products should be kept out of the reach of children and pets.  NOTE: Because a positive UDS for any illicit substance is a violation of our medication agreement, your opioid analgesics (pain medicine) may be permanently discontinued.  MORE ABOUT CBD  General Information: CBD was discovered in [birth year] and it is a derivative of the cannabis sativa genus plants (Marijuana and Hemp). It is one of the 113 identified substances found in Marijuana. It accounts for up to 40% of the plant's extract. As of 2018, preliminary clinical studies on CBD included research for the treatment of anxiety, movement disorders, and pain. CBD is available and consumed in multiple forms, including inhalation of smoke or vapor, as an aerosol spray, and by mouth. It may be supplied as an oil containing CBD, capsules, dried cannabis, or as a liquid solution. CBD is thought not to be as psychoactive as THC (delta-9-tetrahydrocannabinol - the chemical in marijuana responsible for the "HIGH"). Studies suggest that CBD may interact with different biological target receptors in the body, including cannabinoid and other neurotransmitter receptors. As of  2018 the mechanism of action for its biological effects has not been determined.  Side-effects  Adverse reactions: Dry mouth, diarrhea, decreased appetite, fatigue, drowsiness, malaise, weakness, sleep disturbances, and others.  Drug interactions:  CBD may interact with medications such as blood-thinners. CBD causes drowsiness on its own and it will increase drowsiness caused by other medications, including antihistamines (such as Benadryl), benzodiazepines (Xanax, Ativan, Valium), antipsychotics, antidepressants, opioids, alcohol and supplements such as kava, melatonin and St. John's Wort.  Other drug interactions: Brivaracetam (Briviact); Caffeine; Carbamazepine (Tegretol); Citalopram (Celexa); Clobazam (Onfi); Eslicarbazepine (Aptiom); Everolimus (Zostress); Lithium; Methadone (Dolophine); Rufinamide (Banzel); Sedative medications (CNS depressants); Sirolimus (Rapamune); Stiripentol (Diacomit); Tacrolimus (Prograf); Tamoxifen ; Soltamox); Topiramate (Topamax); Valproate; Warfarin (Coumadin); Zonisamide. (Last update: 12/20/2021) ____________________________________________________________________________________________   ____________________________________________________________________________________________  Naloxone Nasal Spray  Why am I receiving this medication? [place] STOP ACT requires that all patients taking high dose opioids or at risk of opioids respiratory depression, be prescribed an opioid reversal agent, such as Naloxone (AKA: Narcan).  What is this medication? NALOXONE (nal OX one) treats opioid overdose, which causes slow or shallow breathing, severe drowsiness, or trouble staying awake. Call emergency services after using this medication. You may need additional treatment. Naloxone works by reversing the effects of opioids. It belongs to a group of medications called opioid blockers.  COMMON BRAND NAME(S): Kloxxado, Narcan  What should I tell my care team before  I take this medication? They need to know if you have any of these conditions: Heart disease Substance use disorder An unusual or allergic reaction to naloxone, other medications, foods, dyes, or preservatives Pregnant or trying to get pregnant Breast-feeding  When to use this medication? This medication is to be used for the treatment of respiratory depression (less than 8 breaths per minute) secondary to opioid overdose.   How to use this medication? This medication is for use in the nose. Lay the person on their back. Support their neck with your hand and allow the head to tilt back before giving the medication. The nasal spray should be given into 1 nostril. After giving the medication, move the person onto their side. Do not remove or test the nasal spray until ready to use. Get emergency medical help right away after giving the first dose of this medication, even if the person wakes up. You should be familiar with how to recognize the signs and symptoms of a narcotic overdose. If more doses are needed, give the additional dose in the other nostril. Talk to your care team about the use of this medication in children. While this medication may be prescribed for children as young as newborns for selected conditions, precautions do apply.  Naloxone Overdosage: If you think you have taken too much of this medicine contact a poison control center or emergency room at once.  NOTE: This medicine is only for you. Do not share this medicine with others.  What if I miss a dose? This does not apply.  What may interact with this medication? This is only used during an emergency. No interactions are expected during emergency use. This list may not describe all possible interactions. Give your health care provider a list of all the medicines, herbs, non-prescription drugs, or dietary supplements you use. Also tell them if you smoke, drink alcohol, or use illegal drugs. Some items may interact with  your medicine.  What should I watch for while using this medication? Keep this medication ready for use in the case of an opioid overdose. Make sure that you have the phone number of your care team and local hospital ready. You may need to have additional doses of this medication. Each nasal spray contains a single dose. Some emergencies may require additional doses. After use, bring the treated person to the nearest hospital or call 911. Make sure the treating care team knows that the person has received a dose of this medication. You will receive additional instructions on what to do during and after use of this  medication before an emergency occurs.  What side effects may I notice from receiving this medication? Side effects that you should report to your care team as soon as possible: Allergic reactions--skin rash, itching, hives, swelling of the face, lips, tongue, or throat Side effects that usually do not require medical attention (report these to your care team if they continue or are bothersome): Constipation Dryness or irritation inside the nose Headache Increase in blood pressure Muscle spasms Stuffy nose Toothache This list may not describe all possible side effects. Call your doctor for medical advice about side effects. You may report side effects to FDA at 1-800-FDA-1088.  Where should I keep my medication? Because this is an emergency medication, you should keep it with you at all times.  Keep out of the reach of children and pets. Store between 20 and 25 degrees C (68 and 77 degrees F). Do not freeze. Throw away any unused medication after the expiration date. Keep in original box until ready to use.  NOTE: This sheet is a summary. It may not cover all possible information. If you have questions about this medicine, talk to your doctor, pharmacist, or health care provider.   2023 [employer]vier/Gold Standard (2020-09-04  00:00:00)  ____________________________________________________________________________________________

## 2022-05-19 ENCOUNTER — Other Ambulatory Visit: Payer: Self-pay | Admitting: Psychiatry

## 2022-05-19 DIAGNOSIS — F3342 Major depressive disorder, recurrent, in full remission: Secondary | ICD-10-CM

## 2022-06-03 DIAGNOSIS — G3184 Mild cognitive impairment, so stated: Secondary | ICD-10-CM | POA: Insufficient documentation

## 2022-06-03 DIAGNOSIS — M151 Heberden's nodes (with arthropathy): Secondary | ICD-10-CM | POA: Insufficient documentation

## 2022-06-13 ENCOUNTER — Other Ambulatory Visit: Payer: Self-pay | Admitting: Psychiatry

## 2022-06-13 DIAGNOSIS — F3342 Major depressive disorder, recurrent, in full remission: Secondary | ICD-10-CM

## 2022-06-14 ENCOUNTER — Telehealth: Payer: Self-pay | Admitting: Psychiatry

## 2022-06-14 NOTE — Telephone Encounter (Signed)
Patient is scheduled for 7/22 virtual appointment and is on waitlist for earlier appointment. Patient requested refill on medications before that appointment-please advise

## 2022-06-14 NOTE — Telephone Encounter (Signed)
I received a refill request for Cymbalta and I just renewed it for her for 90 days supply.  Since she has not seen me in a very long time not sure if she is taking anything else at this time.  If she needs any other medications please advise her to requested to her pharmacy as needed.

## 2022-06-20 NOTE — Telephone Encounter (Signed)
Pt was already notified and pt has an appt set up for 7-22

## 2022-06-28 ENCOUNTER — Ambulatory Visit: Payer: Medicare Other | Admitting: Neurosurgery

## 2022-07-04 NOTE — Progress Notes (Signed)
Unsuccessful attempt to contact patient for Virtual Visit (Pain Management Telehealth)   Patient provided contact information:  [phone] (home); [phone] (mobile); (Preferred) [phone] [email or web address]   Pre-screening:  Our staff was successful in contacting [name] using the above provided information.   I unsuccessfully attempted to make contact with [name] on 4 different occasions on 07/11/2022 via telephone. I was unable to complete the virtual encounter due to call going directly to voicemail. I was able to leave a message.  The patient was scheduled for a face-to-face encounter, but he was canceled when her daughter (also her ride) came down with COVID.  Pharmacotherapy Assessment  Analgesic: Oxycodone IR 5 mg every 6 hours (20 mg/day) MME/day: 30 mg/day.   Follow-up plan:   Reschedule Visit.     Interventional Therapies  Risk  Complexity Considerations:   Estimated body mass index is 38.27 kg/m as calculated from the following:   Height as of 12/22/20: 5\' 5"  (1.651 m).   Weight as of 12/22/20: 230 lb (104.3 kg). PLAQUENIL Anticoagulation (Stop: 11 days)   Planned  Pending:   Therapeutic midline L2-3 LESI #4    Under consideration:   Diagnostic bilateral genicular NB  Diagnostic bilateral L3 and/or L5 TFESI   Completed:   Therapeutic right IA steroid knee injection x2 (10/15/2015)  Therapeutic right Hyalgan knee injection x1 (09/01/2016)  Therapeutic left lumbar facet block x1 (01/22/2015)  Therapeutic left IA hip injection x1 (03/17/2015)  Therapeutic left L4-5 LESI x1 (02/04/2020) (100/100/85/85)  Therapeutic midline L2-3 LESI x3 (12/22/2020) (95/95/95/95)    Therapeutic  Palliative (PRN) options:   Therapeutic right IA Hyalgan knee injections Palliative left IA hip joint injection  Diagnostic left lumbar facet MBB #2        Recent Visits No visits were found meeting these conditions. Showing recent visits within past 90 days and  meeting all other requirements Today's Visits Date Type Provider Dept  07/11/22 Office Visit Delano Metz, MD [facility]  Showing today's visits and meeting all other requirements Future Appointments No visits were found meeting these conditions. Showing future appointments within next 90 days and meeting all other requirements   Note by: Oswaldo Done, MD Date: 07/11/2022; Time: 3:41 PM

## 2022-07-07 NOTE — Patient Instructions (Signed)
____________________________________________________________________________________________  Opioid Pain Medication Update  To: All patients taking opioid pain medications. (I.e.: hydrocodone, hydromorphone, oxycodone, oxymorphone, morphine, codeine, methadone, tapentadol, tramadol, buprenorphine, fentanyl, etc.)  Re: Updated review of side effects and adverse reactions of opioid analgesics, as well as new information about long term effects of this class of medications.  Direct risks of long-term opioid therapy are not limited to opioid addiction and overdose. Potential medical risks include serious fractures, breathing problems during sleep, hyperalgesia, immunosuppression, chronic constipation, bowel obstruction, myocardial infarction, and tooth decay secondary to xerostomia.  Unpredictable adverse effects that can occur even if you take your medication correctly: Cognitive impairment, respiratory depression, and death. Most people think that if they take their medication "correctly", and "as instructed", that they will be safe. Nothing could be farther from the truth. In reality, a significant amount of recorded deaths associated with the use of opioids has occurred in individuals that had taken the medication for a long time, and were taking their medication correctly. The following are examples of how this can happen: Patient taking his/her medication for a long time, as instructed, without any side effects, is given a certain antibiotic or another unrelated medication, which in turn triggers a "Drug-to-drug interaction" leading to disorientation, cognitive impairment, impaired reflexes, respiratory depression or an untoward event leading to serious bodily harm or injury, including death.  Patient taking his/her medication for a long time, as instructed, without any side effects, develops an acute impairment of liver and/or kidney function. This will lead to a rapid inability of the body to  breakdown and eliminate their pain medication, which will result in effects similar to an "overdose", but with the same medicine and dose that they had always taken. This again may lead to disorientation, cognitive impairment, impaired reflexes, respiratory depression or an untoward event leading to serious bodily harm or injury, including death.  A similar problem will occur with patients as they grow older and their liver and kidney function begins to decrease as part of the aging process.  Background information: Historically, the original case for using long-term opioid therapy to treat chronic noncancer pain was based on safety assumptions that subsequent experience has called into question. In 1996, the American Pain Society and the American Academy of Pain Medicine issued a consensus statement supporting long-term opioid therapy. This statement acknowledged the dangers of opioid prescribing but concluded that the risk for addiction was low; respiratory depression induced by opioids was short-lived, occurred mainly in opioid-naive patients, and was antagonized by pain; tolerance was not a common problem; and efforts to control diversion should not constrain opioid prescribing. This has now proven to be wrong. Experience regarding the risks for opioid addiction, misuse, and overdose in community practice has failed to support these assumptions.  According to the Centers for Disease Control and Prevention, fatal overdoses involving opioid analgesics have increased sharply over the past decade. Currently, more than 96,700 people die from drug overdoses every year. Opioids are a factor in 7 out of every 10 overdose deaths. Deaths from drug overdose have surpassed motor vehicle accidents as the leading cause of death for individuals between the ages of 35 and 54.  Clinical data suggest that neuroendocrine dysfunction may be very common in both men and women, potentially causing hypogonadism, erectile  dysfunction, infertility, decreased libido, osteoporosis, and depression. Recent studies linked higher opioid dose to increased opioid-related mortality. Controlled observational studies reported that long-term opioid therapy may be associated with increased risk for cardiovascular events. Subsequent meta-analysis concluded   that the safety of long-term opioid therapy in elderly patients has not been proven.   Side Effects and adverse reactions: Common side effects: Drowsiness (sedation). Dizziness. Nausea and vomiting. Constipation. Physical dependence -- Dependence often manifests with withdrawal symptoms when opioids are discontinued or decreased. Tolerance -- As you take repeated doses of opioids, you require increased medication to experience the same effect of pain relief. Respiratory depression -- This can occur in healthy people, especially with higher doses. However, people with COPD, asthma or other lung conditions may be even more susceptible to fatal respiratory impairment.  Uncommon side effects: An increased sensitivity to feeling pain and extreme response to pain (hyperalgesia). Chronic use of opioids can lead to this. Delayed gastric emptying (the process by which the contents of your stomach are moved into your small intestine). Muscle rigidity. Immune system and hormonal dysfunction. Quick, involuntary muscle jerks (myoclonus). Arrhythmia. Itchy skin (pruritus). Dry mouth (xerostomia).  Long-term side effects: Chronic constipation. Sleep-disordered breathing (SDB). Increased risk of bone fractures. Hypothalamic-pituitary-adrenal dysregulation. Increased risk of overdose.  RISKS: Fractures and Falls:  Opioids increase the risk and incidence of falls. This is of particular importance in elderly patients.  Endocrine System:  Long-term administration is associated with endocrine abnormalities (endocrinopathies). (Also known as Opioid-induced Endocrinopathy) Influences  on both the hypothalamic-pituitary-adrenal axis?and the hypothalamic-pituitary-gonadal axis have been demonstrated with consequent hypogonadism and adrenal insufficiency in both sexes. Hypogonadism and decreased levels of dehydroepiandrosterone sulfate have been reported in men and women. Endocrine effects include: Amenorrhoea in women (abnormal absence of menstruation) Reduced libido in both sexes Decreased sexual function Erectile dysfunction in men Hypogonadisms (decreased testicular function with shrinkage of testicles) Infertility Depression and fatigue Loss of muscle mass Anxiety Depression Immune suppression Hyperalgesia Weight gain Anemia Osteoporosis Patients (particularly women of childbearing age) should avoid opioids. There is insufficient evidence to recommend routine monitoring of asymptomatic patients taking opioids in the long-term for hormonal deficiencies.  Immune System: Human studies have demonstrated that opioids have an immunomodulating effect. These effects are mediated via opioid receptors both on immune effector cells and in the central nervous system. Opioids have been demonstrated to have adverse effects on antimicrobial response and anti-tumour surveillance. Buprenorphine has been demonstrated to have no impact on immune function.  Opioid Induced Hyperalgesia: Human studies have demonstrated that prolonged use of opioids can lead to a state of abnormal pain sensitivity, sometimes called opioid induced hyperalgesia (OIH). Opioid induced hyperalgesia is not usually seen in the absence of tolerance to opioid analgesia. Clinically, hyperalgesia may be diagnosed if the patient on long-term opioid therapy presents with increased pain. This might be qualitatively and anatomically distinct from pain related to disease progression or to breakthrough pain resulting from development of opioid tolerance. Pain associated with hyperalgesia tends to be more diffuse than the  pre-existing pain and less defined in quality. Management of opioid induced hyperalgesia requires opioid dose reduction.  Cancer: Chronic opioid therapy has been associated with an increased risk of cancer among noncancer patients with chronic pain. This association was more evident in chronic strong opioid users. Chronic opioid consumption causes significant pathological changes in the small intestine and colon. Epidemiological studies have found that there is a link between opium dependence and initiation of gastrointestinal cancers. Cancer is the second leading cause of death after cardiovascular disease. Chronic use of opioids can cause multiple conditions such as GERD, immunosuppression and renal damage as well as carcinogenic effects, which are associated with the incidence of cancers.   Mortality: Long-term opioid use   has been associated with increased mortality among patients with chronic non-cancer pain (CNCP).  Prescription of long-acting opioids for chronic noncancer pain was associated with a significantly increased risk of all-cause mortality, including deaths from causes other than overdose.  Reference: Von Korff M, Kolodny A, Deyo RA, Chou R. Long-term opioid therapy reconsidered. Ann Intern Med. 2011 Sep 6;155(5):325-8. doi: 10.7326/[id number]. PMID: 21893626; PMCID: PMC3280085. Bedson J, Chen Y, Ashworth J, Hayward RA, Dunn KM, Jordan KP. Risk of adverse events in patients prescribed long-term opioids: A cohort study in the [place] Clinical Practice Research Datalink. Eur J Pain. 2019 May;23(5):908-922. doi: 10.1002/ejp.1357. Epub 2019 Jan 31. PMID: 30620116. Colameco S, Coren JS, Ciervo CA. Continuous opioid treatment for chronic noncancer pain: a time for moderation in prescribing. Postgrad Med. 2009 Jul;121(4):61-6. doi: 10.3810/pgm.2009.07.2032. PMID: 19641271. Chou R, Turner JA, Devine EB, Hansen RN, Sullivan SD, Blazina I, Dana T, Bougatsos C, Deyo RA. The  effectiveness and risks of long-term opioid therapy for chronic pain: a systematic review for a National Institutes of Health Pathways to Prevention Workshop. Ann Intern Med. 2015 Feb 17;162(4):276-86. doi: 10.7326/M14-2559. PMID: 25581257. Warner M, Chen LH, Makuc DM. NCHS Data Brief No. 22. [place]: Centers for Disease Control and Prevention; 2009. Sep, Increase in Fatal Poisonings Involving Opioid Analgesics in the [place], 1999-2006. Song IA, Choi HR, Oh TK. Long-term opioid use and mortality in patients with chronic non-cancer pain: Ten-year follow-up study in [place] from 2010 through 2019. EClinicalMedicine. 2022 Jul 18;51:101558. doi: 10.1016/j.eclinm.2022.101558. PMID: 35875817; PMCID: PMC9304910. Huser, W., Schubert, T., Vogelmann, T. et al. All-cause mortality in patients with long-term opioid therapy compared with non-opioid analgesics for chronic non-cancer pain: a database study. BMC Med 18, 162 (2020). https://doi.org/10.1186/[email or web address] Rashidian H, Zendehdel K, Kamangar F, Malekzadeh R, Haghdoost AA. An Ecological Study of the Association between Opiate Use and Incidence of Cancers. Addict Health. 2016 Fall;8(4):252-260. PMID: 28819556; PMCID: PMC5554805.  Our Goal: Our goal is to control your pain with means other than the use of opioid pain medications.  Our Recommendation: Talk to your physician about coming off of these medications. We can assist you with the tapering down and stopping these medicines. Based on the new information, even if you cannot completely stop the medication, a decrease in the dose may be associated with a lesser risk. Ask for other means of controlling the pain. Decrease or eliminate those factors that significantly contribute to your pain such as smoking, obesity, and a diet heavily tilted towards "inflammatory" nutrients.  Last Updated: 03/09/2022    ____________________________________________________________________________________________     ____________________________________________________________________________________________  Transfer of Pain Medication between Pharmacies  Re: 2023 DEA Clarification on existing regulation  Published on DEA Website: September 10, 2021  Title: Revised Regulation Allows DEA-Registered Pharmacies to Transfer Electronic Prescriptions at a Patient's Request DEA Headquarters Division - Public Information Office  "Patients now have the ability to request their electronic prescription be transferred to another pharmacy without having to go back to their practitioner to initiate the request. This revised regulation went into effect on Monday, September 06, 2021.     At a patient's request, a DEA-registered retail pharmacy can now transfer an electronic prescription for a controlled substance (schedules II-V) to another DEA-registered retail pharmacy. Prior to this change, patients would have to go through their practitioner to cancel their prescription and have it re-issued to a different pharmacy. The process was taxing and time consuming for both patients and practitioners.    The Drug Enforcement Administration (DEA) published its intent to revise the   process for transferring electronic prescriptions on November 29, 2019.  The final rule was published in the federal register on August 05, 2021 and went into effect 30 days later.  Under the final rule, a prescription can only be transferred once between pharmacies, and only if allowed under existing state or other applicable law. The prescription must remain in its electronic form; may not be altered in any way; and the transfer must be communicated directly between two licensed pharmacists. It's important to note, any authorized refills transfer with the original prescription, which means the entire prescription will be filled at the same pharmacy."     REFERENCES: 1. DEA website announcement https://www.dea.gov/stories/2023/2023-09/2021-09-01/revised-regulation-allows-dea-registered-pharmacies-transfer  2. Department of Justice website  https://www.govinfo.gov/content/pkg/FR-2021-08-05/pdf/2023-15847.pdf  3. DEPARTMENT OF JUSTICE Drug Enforcement Administration 21 CFR Part 1306 [Docket No. DEA-637] RIN [id number] "Transfer of Electronic Prescriptions for Schedules II-V Controlled Substances Between Pharmacies for Initial Filling"  ____________________________________________________________________________________________     _______________________________________________________________________  Medication Rules  Purpose: To inform patients, and their family members, of our medication rules and regulations.  Applies to: All patients receiving prescriptions from our practice (written or electronic).  Pharmacy of record: This is the pharmacy where your electronic prescriptions will be sent. Make sure we have the correct one.  Electronic prescriptions: In compliance with the [place] Strengthen Opioid Misuse Prevention (STOP) Act of 2017 (Session Law 2017-74/H243), effective January 10, 2018, all controlled substances must be electronically prescribed. Written prescriptions, faxing, or calling prescriptions to a pharmacy will no longer be done.  Prescription refills: These will be provided only during in-person appointments. No medications will be renewed without a "face-to-face" evaluation with your provider. Applies to all prescriptions.  NOTE: The following applies primarily to controlled substances (Opioid* Pain Medications).   Type of encounter (visit): For patients receiving controlled substances, face-to-face visits are required. (Not an option and not up to the patient.)  Patient's responsibilities: Pain Pills: Bring all pain pills to every appointment (except for procedure appointments). Pill Bottles: Bring  pills in original pharmacy bottle. Bring bottle, even if empty. Always bring the bottle of the most recent fill.  Medication refills: You are responsible for knowing and keeping track of what medications you are taking and when is it that you will need a refill. The day before your appointment: write a list of all prescriptions that need to be refilled. The day of the appointment: give the list to the admitting nurse. Prescriptions will be written only during appointments. No prescriptions will be written on procedure days. If you forget a medication: it will not be "Called in", "Faxed", or "electronically sent". You will need to get another appointment to get these prescribed. No early refills. Do not call asking to have your prescription filled early. Partial  or short prescriptions: Occasionally your pharmacy may not have enough pills to fill your prescription.  NEVER ACCEPT a partial fill or a prescription that is short of the total amount of pills that you were prescribed.  With controlled substances the law allows 72 hours for the pharmacy to complete the prescription.  If the prescription is not completed within 72 hours, the pharmacist will require a new prescription to be written. This means that you will be short on your medicine and we WILL NOT send another prescription to complete your original prescription.  Instead, request the pharmacy to send a carrier to a nearby branch to get enough medication to provide you with your full prescription. Prescription Accuracy: You are responsible for carefully inspecting your   prescriptions before leaving our office. Have the discharge nurse carefully go over each prescription with you, before taking them home. Make sure that your name is accurately spelled, that your address is correct. Check the name and dose of your medication to make sure it is accurate. Check the number of pills, and the written instructions to make sure they are clear and accurate. Make  sure that you are given enough medication to last until your next medication refill appointment. Taking Medication: Take medication as prescribed. When it comes to controlled substances, taking less pills or less frequently than prescribed is permitted and encouraged. Never take more pills than instructed. Never take the medication more frequently than prescribed.  Inform other Doctors: Always inform, all of your healthcare providers, of all the medications you take. Pain Medication from other Providers: You are not allowed to accept any additional pain medication from any other Doctor or Healthcare provider. There are two exceptions to this rule. (see below) In the event that you require additional pain medication, you are responsible for notifying us, as stated below. Cough Medicine: Often these contain an opioid, such as codeine or hydrocodone. Never accept or take cough medicine containing these opioids if you are already taking an opioid* medication. The combination may cause respiratory failure and death. Medication Agreement: You are responsible for carefully reading and following our Medication Agreement. This must be signed before receiving any prescriptions from our practice. Safely store a copy of your signed Agreement. Violations to the Agreement will result in no further prescriptions. (Additional copies of our Medication Agreement are available upon request.) Laws, Rules, & Regulations: All patients are expected to follow all Federal and State Laws, Statutes, Rules, & Regulations. Ignorance of the Laws does not constitute a valid excuse.  Illegal drugs and Controlled Substances: The use of illegal substances (including, but not limited to marijuana and its derivatives) and/or the illegal use of any controlled substances is strictly prohibited. Violation of this rule may result in the immediate and permanent discontinuation of any and all prescriptions being written by our practice. The use of  any illegal substances is prohibited. Adopted CDC guidelines & recommendations: Target dosing levels will be at or below 60 MME/day. Use of benzodiazepines** is not recommended.  Exceptions: There are only two exceptions to the rule of not receiving pain medications from other Healthcare Providers. Exception #1 (Emergencies): In the event of an emergency (i.e.: accident requiring emergency care), you are allowed to receive additional pain medication. However, you are responsible for: As soon as you are able, call our office [phone], at any time of the day or night, and leave a message stating your name, the date and nature of the emergency, and the name and dose of the medication prescribed. In the event that your call is answered by a member of our staff, make sure to document and save the date, time, and the name of the person that took your information.  Exception #2 (Planned Surgery): In the event that you are scheduled by another doctor or dentist to have any type of surgery or procedure, you are allowed (for a period no longer than 30 days), to receive additional pain medication, for the acute post-op pain. However, in this case, you are responsible for picking up a copy of our "Post-op Pain Management for Surgeons" handout, and giving it to your surgeon or dentist. This document is available at our office, and does not require an appointment to obtain it. Simply go to   our office during business hours (Monday-Thursday from 8:00 AM to 4:00 PM) (Friday 8:00 AM to 12:00 Noon) or if you have a scheduled appointment with us, prior to your surgery, and ask for it by name. In addition, you are responsible for: calling our office [phone], at any time of the day or night, and leaving a message stating your name, name of your surgeon, type of surgery, and date of procedure or surgery. Failure to comply with your responsibilities may result in termination of therapy involving the controlled  substances. Medication Agreement Violation. Following the above rules, including your responsibilities will help you in avoiding a Medication Agreement Violation ("Breaking your Pain Medication Contract").  Consequences:  Not following the above rules may result in permanent discontinuation of medication prescription therapy.  *Opioid medications include: morphine, codeine, oxycodone, oxymorphone, hydrocodone, hydromorphone, meperidine, tramadol, tapentadol, buprenorphine, fentanyl, methadone. **Benzodiazepine medications include: diazepam (Valium), alprazolam (Xanax), clonazepam (Klonopine), lorazepam (Ativan), clorazepate (Tranxene), chlordiazepoxide (Librium), estazolam (Prosom), oxazepam (Serax), temazepam (Restoril), triazolam (Halcion) (Last updated: 11/02/2021) ______________________________________________________________________    ______________________________________________________________________  Medication Recommendations and Reminders  Applies to: All patients receiving prescriptions (written and/or electronic).  Medication Rules & Regulations: You are responsible for reading, knowing, and following our "Medication Rules" document. These exist for your safety and that of others. They are not flexible and neither are we. Dismissing or ignoring them is an act of "non-compliance" that may result in complete and irreversible termination of such medication therapy. For safety reasons, "non-compliance" will not be tolerated. As with the [place] fundamental legal principle of "ignorance of the law is no defense", we will accept no excuses for not having read and knowing the content of documents provided to you by our practice.  Pharmacy of record:  Definition: This is the pharmacy where your electronic prescriptions will be sent.  We do not endorse any particular pharmacy. It is up to you and your insurance to decide what pharmacy to use.  We do not restrict you in your choice of  pharmacy. However, once we write for your prescriptions, we will NOT be re-sending more prescriptions to fix restricted supply problems created by your pharmacy, or your insurance.  The pharmacy listed in the electronic medical record should be the one where you want electronic prescriptions to be sent. If you choose to change pharmacy, simply notify our nursing staff. Changes will be made only during your regular appointments and not over the phone.  Recommendations: Keep all of your pain medications in a safe place, under lock and key, even if you live alone. We will NOT replace lost, stolen, or damaged medication. We do not accept "Police Reports" as proof of medications having been stolen. After you fill your prescription, take 1 week's worth of pills and put them away in a safe place. You should keep a separate, properly labeled bottle for this purpose. The remainder should be kept in the original bottle. Use this as your primary supply, until it runs out. Once it's gone, then you know that you have 1 week's worth of medicine, and it is time to come in for a prescription refill. If you do this correctly, it is unlikely that you will ever run out of medicine. To make sure that the above recommendation works, it is very important that you make sure your medication refill appointments are scheduled at least 1 week before you run out of medicine. To do this in an effective manner, make sure that you do not leave the office without   scheduling your next medication management appointment. Always ask the nursing staff to show you in your prescription , when your medication will be running out. Then arrange for the receptionist to get you a return appointment, at least 7 days before you run out of medicine. Do not wait until you have 1 or 2 pills left, to come in. This is very poor planning and does not take into consideration that we may need to cancel appointments due to bad weather, sickness, or emergencies  affecting our staff. DO NOT ACCEPT A "Partial Fill": If for any reason your pharmacy does not have enough pills/tablets to completely fill or refill your prescription, do not allow for a "partial fill". The law allows the pharmacy to complete that prescription within 72 hours, without requiring a new prescription. If they do not fill the rest of your prescription within those 72 hours, you will need a separate prescription to fill the remaining amount, which we will NOT provide. If the reason for the partial fill is your insurance, you will need to talk to the pharmacist about payment alternatives for the remaining tablets, but again, DO NOT ACCEPT A PARTIAL FILL, unless you can trust your pharmacist to obtain the remainder of the pills within 72 hours.  Prescription refills and/or changes in medication(s):  Prescription refills, and/or changes in dose or medication, will be conducted only during scheduled medication management appointments. (Applies to both, written and electronic prescriptions.) No refills on procedure days. No medication will be changed or started on procedure days. No changes, adjustments, and/or refills will be conducted on a procedure day. Doing so will interfere with the diagnostic portion of the procedure. No phone refills. No medications will be "called into the pharmacy". No Fax refills. No weekend refills. No Holliday refills. No after hours refills.  Remember:  Business hours are:  Monday to Thursday 8:00 AM to 4:00 PM Provider's Schedule: Shaquille Janes, MD - Appointments are:  Medication management: Monday and Wednesday 8:00 AM to 4:00 PM Procedure day: Tuesday and Thursday 7:30 AM to 4:00 PM Bilal Lateef, MD - Appointments are:  Medication management: Tuesday and Thursday 8:00 AM to 4:00 PM Procedure day: Monday and Wednesday 7:30 AM to 4:00 PM (Last update: 11/02/2021) ______________________________________________________________________    ____________________________________________________________________________________________  Naloxone Nasal Spray  Why am I receiving this medication? [place] STOP ACT requires that all patients taking high dose opioids or at risk of opioids respiratory depression, be prescribed an opioid reversal agent, such as Naloxone (AKA: Narcan).  What is this medication? NALOXONE (nal OX one) treats opioid overdose, which causes slow or shallow breathing, severe drowsiness, or trouble staying awake. Call emergency services after using this medication. You may need additional treatment. Naloxone works by reversing the effects of opioids. It belongs to a group of medications called opioid blockers.  COMMON BRAND NAME(S): Kloxxado, Narcan  What should I tell my care team before I take this medication? They need to know if you have any of these conditions: Heart disease Substance use disorder An unusual or allergic reaction to naloxone, other medications, foods, dyes, or preservatives Pregnant or trying to get pregnant Breast-feeding  When to use this medication? This medication is to be used for the treatment of respiratory depression (less than 8 breaths per minute) secondary to opioid overdose.   How to use this medication? This medication is for use in the nose. Lay the person on their back. Support their neck with your hand and allow the head to tilt   back before giving the medication. The nasal spray should be given into 1 nostril. After giving the medication, move the person onto their side. Do not remove or test the nasal spray until ready to use. Get emergency medical help right away after giving the first dose of this medication, even if the person wakes up. You should be familiar with how to recognize the signs and symptoms of a narcotic overdose. If more doses are needed, give the additional dose in the other nostril. Talk to your care team about the use of this medication in children.  While this medication may be prescribed for children as young as newborns for selected conditions, precautions do apply.  Naloxone Overdosage: If you think you have taken too much of this medicine contact a poison control center or emergency room at once.  NOTE: This medicine is only for you. Do not share this medicine with others.  What if I miss a dose? This does not apply.  What may interact with this medication? This is only used during an emergency. No interactions are expected during emergency use. This list may not describe all possible interactions. Give your health care provider a list of all the medicines, herbs, non-prescription drugs, or dietary supplements you use. Also tell them if you smoke, drink alcohol, or use illegal drugs. Some items may interact with your medicine.  What should I watch for while using this medication? Keep this medication ready for use in the case of an opioid overdose. Make sure that you have the phone number of your care team and local hospital ready. You may need to have additional doses of this medication. Each nasal spray contains a single dose. Some emergencies may require additional doses. After use, bring the treated person to the nearest hospital or call 911. Make sure the treating care team knows that the person has received a dose of this medication. You will receive additional instructions on what to do during and after use of this medication before an emergency occurs.  What side effects may I notice from receiving this medication? Side effects that you should report to your care team as soon as possible: Allergic reactions--skin rash, itching, hives, swelling of the face, lips, tongue, or throat Side effects that usually do not require medical attention (report these to your care team if they continue or are bothersome): Constipation Dryness or irritation inside the nose Headache Increase in blood pressure Muscle spasms Stuffy  nose Toothache This list may not describe all possible side effects. Call your doctor for medical advice about side effects. You may report side effects to FDA at 1-800-FDA-1088.  Where should I keep my medication? Because this is an emergency medication, you should keep it with you at all times.  Keep out of the reach of children and pets. Store between 20 and 25 degrees C (68 and 77 degrees F). Do not freeze. Throw away any unused medication after the expiration date. Keep in original box until ready to use.  NOTE: This sheet is a summary. It may not cover all possible information. If you have questions about this medicine, talk to your doctor, pharmacist, or health care provider.   2023 [employer] Standard (2020-09-04 00:00:00)  ____________________________________________________________________________________________   

## 2022-07-08 ENCOUNTER — Encounter: Payer: Self-pay | Admitting: Pain Medicine

## 2022-07-11 ENCOUNTER — Encounter: Payer: Self-pay | Admitting: Pain Medicine

## 2022-07-11 ENCOUNTER — Ambulatory Visit: Payer: Medicare Other | Attending: Pain Medicine | Admitting: Pain Medicine

## 2022-07-11 DIAGNOSIS — G8929 Other chronic pain: Secondary | ICD-10-CM

## 2022-07-11 DIAGNOSIS — M5137 Other intervertebral disc degeneration, lumbosacral region: Secondary | ICD-10-CM

## 2022-07-11 DIAGNOSIS — G894 Chronic pain syndrome: Secondary | ICD-10-CM

## 2022-07-11 DIAGNOSIS — Z79891 Long term (current) use of opiate analgesic: Secondary | ICD-10-CM

## 2022-07-11 DIAGNOSIS — M48061 Spinal stenosis, lumbar region without neurogenic claudication: Secondary | ICD-10-CM

## 2022-07-11 DIAGNOSIS — M4316 Spondylolisthesis, lumbar region: Secondary | ICD-10-CM

## 2022-07-11 DIAGNOSIS — M159 Polyosteoarthritis, unspecified: Secondary | ICD-10-CM

## 2022-07-11 DIAGNOSIS — M2428 Disorder of ligament, vertebrae: Secondary | ICD-10-CM

## 2022-07-11 DIAGNOSIS — M48062 Spinal stenosis, lumbar region with neurogenic claudication: Secondary | ICD-10-CM

## 2022-07-11 DIAGNOSIS — M0600 Rheumatoid arthritis without rheumatoid factor, unspecified site: Secondary | ICD-10-CM

## 2022-07-11 DIAGNOSIS — M47816 Spondylosis without myelopathy or radiculopathy, lumbar region: Secondary | ICD-10-CM

## 2022-07-11 DIAGNOSIS — Z79899 Other long term (current) drug therapy: Secondary | ICD-10-CM

## 2022-07-11 DIAGNOSIS — Z91199 Patient's noncompliance with other medical treatment and regimen due to unspecified reason: Secondary | ICD-10-CM

## 2022-07-12 ENCOUNTER — Ambulatory Visit: Payer: Medicare Other | Admitting: Neurosurgery

## 2022-07-18 ENCOUNTER — Encounter: Payer: PRIVATE HEALTH INSURANCE | Admitting: Pain Medicine

## 2022-07-20 ENCOUNTER — Encounter: Payer: Self-pay | Admitting: Pain Medicine

## 2022-07-20 ENCOUNTER — Ambulatory Visit: Payer: Medicare Other | Attending: Pain Medicine | Admitting: Pain Medicine

## 2022-07-20 VITALS — BP 147/89 | HR 73 | Temp 97.3°F | Resp 16 | Ht 64.0 in | Wt 240.0 lb

## 2022-07-20 DIAGNOSIS — M5442 Lumbago with sciatica, left side: Secondary | ICD-10-CM | POA: Diagnosis not present

## 2022-07-20 DIAGNOSIS — M0600 Rheumatoid arthritis without rheumatoid factor, unspecified site: Secondary | ICD-10-CM | POA: Diagnosis present

## 2022-07-20 DIAGNOSIS — M5441 Lumbago with sciatica, right side: Secondary | ICD-10-CM | POA: Insufficient documentation

## 2022-07-20 DIAGNOSIS — M79642 Pain in left hand: Secondary | ICD-10-CM | POA: Insufficient documentation

## 2022-07-20 DIAGNOSIS — M25561 Pain in right knee: Secondary | ICD-10-CM | POA: Diagnosis present

## 2022-07-20 DIAGNOSIS — Z79891 Long term (current) use of opiate analgesic: Secondary | ICD-10-CM

## 2022-07-20 DIAGNOSIS — G894 Chronic pain syndrome: Secondary | ICD-10-CM

## 2022-07-20 DIAGNOSIS — M2428 Disorder of ligament, vertebrae: Secondary | ICD-10-CM | POA: Diagnosis present

## 2022-07-20 DIAGNOSIS — G8929 Other chronic pain: Secondary | ICD-10-CM | POA: Diagnosis present

## 2022-07-20 DIAGNOSIS — M79605 Pain in left leg: Secondary | ICD-10-CM | POA: Insufficient documentation

## 2022-07-20 DIAGNOSIS — M47816 Spondylosis without myelopathy or radiculopathy, lumbar region: Secondary | ICD-10-CM

## 2022-07-20 DIAGNOSIS — M25552 Pain in left hip: Secondary | ICD-10-CM | POA: Insufficient documentation

## 2022-07-20 DIAGNOSIS — M48061 Spinal stenosis, lumbar region without neurogenic claudication: Secondary | ICD-10-CM

## 2022-07-20 DIAGNOSIS — M4316 Spondylolisthesis, lumbar region: Secondary | ICD-10-CM | POA: Diagnosis present

## 2022-07-20 DIAGNOSIS — M5137 Other intervertebral disc degeneration, lumbosacral region: Secondary | ICD-10-CM | POA: Diagnosis present

## 2022-07-20 DIAGNOSIS — M79604 Pain in right leg: Secondary | ICD-10-CM | POA: Insufficient documentation

## 2022-07-20 DIAGNOSIS — M48062 Spinal stenosis, lumbar region with neurogenic claudication: Secondary | ICD-10-CM | POA: Diagnosis present

## 2022-07-20 DIAGNOSIS — M51379 Other intervertebral disc degeneration, lumbosacral region without mention of lumbar back pain or lower extremity pain: Secondary | ICD-10-CM

## 2022-07-20 DIAGNOSIS — M159 Polyosteoarthritis, unspecified: Secondary | ICD-10-CM | POA: Diagnosis present

## 2022-07-20 DIAGNOSIS — Z79899 Other long term (current) drug therapy: Secondary | ICD-10-CM

## 2022-07-20 MED ORDER — OXYCODONE HCL 5 MG PO TABS
5.0000 mg | ORAL_TABLET | Freq: Four times a day (QID) | ORAL | 0 refills | Status: DC | PRN
Start: 2022-07-20 — End: 2022-08-25

## 2022-07-20 MED ORDER — OXYCODONE HCL 5 MG PO TABS
5.0000 mg | ORAL_TABLET | Freq: Four times a day (QID) | ORAL | 0 refills | Status: AC | PRN
Start: 2022-08-19 — End: 2022-09-18

## 2022-07-20 MED ORDER — OXYCODONE HCL 5 MG PO TABS
5.0000 mg | ORAL_TABLET | Freq: Four times a day (QID) | ORAL | 0 refills | Status: AC | PRN
Start: 2022-09-18 — End: 2022-10-18

## 2022-07-20 NOTE — Patient Instructions (Signed)
____________________________________________________________________________________________  Opioid Pain Medication Update  To: All patients taking opioid pain medications. (I.e.: hydrocodone, hydromorphone, oxycodone, oxymorphone, morphine, codeine, methadone, tapentadol, tramadol, buprenorphine, fentanyl, etc.)  Re: Updated review of side effects and adverse reactions of opioid analgesics, as well as new information about long term effects of this class of medications.  Direct risks of long-term opioid therapy are not limited to opioid addiction and overdose. Potential medical risks include serious fractures, breathing problems during sleep, hyperalgesia, immunosuppression, chronic constipation, bowel obstruction, myocardial infarction, and tooth decay secondary to xerostomia.  Unpredictable adverse effects that can occur even if you take your medication correctly: Cognitive impairment, respiratory depression, and death. Most people think that if they take their medication "correctly", and "as instructed", that they will be safe. Nothing could be farther from the truth. In reality, a significant amount of recorded deaths associated with the use of opioids has occurred in individuals that had taken the medication for a long time, and were taking their medication correctly. The following are examples of how this can happen: Patient taking his/her medication for a long time, as instructed, without any side effects, is given a certain antibiotic or another unrelated medication, which in turn triggers a "Drug-to-drug interaction" leading to disorientation, cognitive impairment, impaired reflexes, respiratory depression or an untoward event leading to serious bodily harm or injury, including death.  Patient taking his/her medication for a long time, as instructed, without any side effects, develops an acute impairment of liver and/or kidney function. This will lead to a rapid inability of the body to  breakdown and eliminate their pain medication, which will result in effects similar to an "overdose", but with the same medicine and dose that they had always taken. This again may lead to disorientation, cognitive impairment, impaired reflexes, respiratory depression or an untoward event leading to serious bodily harm or injury, including death.  A similar problem will occur with patients as they grow older and their liver and kidney function begins to decrease as part of the aging process.  Background information: Historically, the original case for using long-term opioid therapy to treat chronic noncancer pain was based on safety assumptions that subsequent experience has called into question. In 1996, the American Pain Society and the American Academy of Pain Medicine issued a consensus statement supporting long-term opioid therapy. This statement acknowledged the dangers of opioid prescribing but concluded that the risk for addiction was low; respiratory depression induced by opioids was short-lived, occurred mainly in opioid-naive patients, and was antagonized by pain; tolerance was not a common problem; and efforts to control diversion should not constrain opioid prescribing. This has now proven to be wrong. Experience regarding the risks for opioid addiction, misuse, and overdose in community practice has failed to support these assumptions.  According to the Centers for Disease Control and Prevention, fatal overdoses involving opioid analgesics have increased sharply over the past decade. Currently, more than 96,700 people die from drug overdoses every year. Opioids are a factor in 7 out of every 10 overdose deaths. Deaths from drug overdose have surpassed motor vehicle accidents as the leading cause of death for individuals between the ages of 35 and 54.  Clinical data suggest that neuroendocrine dysfunction may be very common in both men and women, potentially causing hypogonadism, erectile  dysfunction, infertility, decreased libido, osteoporosis, and depression. Recent studies linked higher opioid dose to increased opioid-related mortality. Controlled observational studies reported that long-term opioid therapy may be associated with increased risk for cardiovascular events. Subsequent meta-analysis concluded   that the safety of long-term opioid therapy in elderly patients has not been proven.   Side Effects and adverse reactions: Common side effects: Drowsiness (sedation). Dizziness. Nausea and vomiting. Constipation. Physical dependence -- Dependence often manifests with withdrawal symptoms when opioids are discontinued or decreased. Tolerance -- As you take repeated doses of opioids, you require increased medication to experience the same effect of pain relief. Respiratory depression -- This can occur in healthy people, especially with higher doses. However, people with COPD, asthma or other lung conditions may be even more susceptible to fatal respiratory impairment.  Uncommon side effects: An increased sensitivity to feeling pain and extreme response to pain (hyperalgesia). Chronic use of opioids can lead to this. Delayed gastric emptying (the process by which the contents of your stomach are moved into your small intestine). Muscle rigidity. Immune system and hormonal dysfunction. Quick, involuntary muscle jerks (myoclonus). Arrhythmia. Itchy skin (pruritus). Dry mouth (xerostomia).  Long-term side effects: Chronic constipation. Sleep-disordered breathing (SDB). Increased risk of bone fractures. Hypothalamic-pituitary-adrenal dysregulation. Increased risk of overdose.  RISKS: Respiratory depression and death: Opioids increase the risk of respiratory depression and death.  Drug-to-drug interactions: Opioids are relatively contraindicated in combination with benzodiazepines, sleep inducers, and other central nervous system depressants. Other classes of medications  (i.e.: certain antibiotics and even over-the-counter medications) may also trigger or induce respiratory depression in some patients.  Medical conditions: Patients with pre-existing respiratory problems are at higher risk of respiratory failure and/or depression when in combination with opioid analgesics. Opioids are relatively contraindicated in some medical conditions such as central sleep apnea.   Fractures and Falls:  Opioids increase the risk and incidence of falls. This is of particular importance in elderly patients.  Endocrine System:  Long-term administration is associated with endocrine abnormalities (endocrinopathies). (Also known as Opioid-induced Endocrinopathy) Influences on both the hypothalamic-pituitary-adrenal axis?and the hypothalamic-pituitary-gonadal axis have been demonstrated with consequent hypogonadism and adrenal insufficiency in both sexes. Hypogonadism and decreased levels of dehydroepiandrosterone sulfate have been reported in men and women. Endocrine effects include: Amenorrhoea in women (abnormal absence of menstruation) Reduced libido in both sexes Decreased sexual function Erectile dysfunction in men Hypogonadisms (decreased testicular function with shrinkage of testicles) Infertility Depression and fatigue Loss of muscle mass Anxiety Depression Immune suppression Hyperalgesia Weight gain Anemia Osteoporosis Patients (particularly women of childbearing age) should avoid opioids. There is insufficient evidence to recommend routine monitoring of asymptomatic patients taking opioids in the long-term for hormonal deficiencies.  Immune System: Human studies have demonstrated that opioids have an immunomodulating effect. These effects are mediated via opioid receptors both on immune effector cells and in the central nervous system. Opioids have been demonstrated to have adverse effects on antimicrobial response and anti-tumour surveillance. Buprenorphine has  been demonstrated to have no impact on immune function.  Opioid Induced Hyperalgesia: Human studies have demonstrated that prolonged use of opioids can lead to a state of abnormal pain sensitivity, sometimes called opioid induced hyperalgesia (OIH). Opioid induced hyperalgesia is not usually seen in the absence of tolerance to opioid analgesia. Clinically, hyperalgesia may be diagnosed if the patient on long-term opioid therapy presents with increased pain. This might be qualitatively and anatomically distinct from pain related to disease progression or to breakthrough pain resulting from development of opioid tolerance. Pain associated with hyperalgesia tends to be more diffuse than the pre-existing pain and less defined in quality. Management of opioid induced hyperalgesia requires opioid dose reduction.  Cancer: Chronic opioid therapy has been associated with an increased risk of cancer   among noncancer patients with chronic pain. This association was more evident in chronic strong opioid users. Chronic opioid consumption causes significant pathological changes in the small intestine and colon. Epidemiological studies have found that there is a link between opium dependence and initiation of gastrointestinal cancers. Cancer is the second leading cause of death after cardiovascular disease. Chronic use of opioids can cause multiple conditions such as GERD, immunosuppression and renal damage as well as carcinogenic effects, which are associated with the incidence of cancers.   Mortality: Long-term opioid use has been associated with increased mortality among patients with chronic non-cancer pain (CNCP).  Prescription of long-acting opioids for chronic noncancer pain was associated with a significantly increased risk of all-cause mortality, including deaths from causes other than overdose.  Reference: Von Korff M, Kolodny A, Deyo RA, Chou R. Long-term opioid therapy reconsidered. Ann Intern Med. 2011  Sep 6;155(5):325-8. doi: 10.7326/[id number]. PMID: 21893626; PMCID: PMC3280085. Bedson J, Chen Y, Ashworth J, Hayward RA, Dunn KM, Jordan KP. Risk of adverse events in patients prescribed long-term opioids: A cohort study in the [place] Clinical Practice Research Datalink. Eur J Pain. 2019 May;23(5):908-922. doi: 10.1002/ejp.1357. Epub 2019 Jan 31. PMID: 30620116. Colameco S, Coren JS, Ciervo CA. Continuous opioid treatment for chronic noncancer pain: a time for moderation in prescribing. Postgrad Med. 2009 Jul;121(4):61-6. doi: 10.3810/pgm.2009.07.2032. PMID: 19641271. Chou R, Turner JA, Devine EB, Hansen RN, Sullivan SD, Blazina I, Dana T, Bougatsos C, Deyo RA. The effectiveness and risks of long-term opioid therapy for chronic pain: a systematic review for a National Institutes of Health Pathways to Prevention Workshop. Ann Intern Med. 2015 Feb 17;162(4):276-86. doi: 10.7326/M14-2559. PMID: 25581257. Warner M, Chen LH, Makuc DM. NCHS Data Brief No. 22. [place]: Centers for Disease Control and Prevention; 2009. Sep, Increase in Fatal Poisonings Involving Opioid Analgesics in the [place], 1999-2006. Song IA, Choi HR, Oh TK. Long-term opioid use and mortality in patients with chronic non-cancer pain: Ten-year follow-up study in [place] from 2010 through 2019. EClinicalMedicine. 2022 Jul 18;51:101558. doi: 10.1016/j.eclinm.2022.101558. PMID: 35875817; PMCID: PMC9304910. Huser, W., Schubert, T., Vogelmann, T. et al. All-cause mortality in patients with long-term opioid therapy compared with non-opioid analgesics for chronic non-cancer pain: a database study. BMC Med 18, 162 (2020). https://doi.org/10.1186/[email or web address] Rashidian H, Zendehdel K, Kamangar F, Malekzadeh R, Haghdoost AA. An Ecological Study of the Association between Opiate Use and Incidence of Cancers. Addict Health. 2016 Fall;8(4):252-260. PMID: 28819556; PMCID: PMC5554805.  Our Goal: Our goal is to control your  pain with means other than the use of opioid pain medications.  Our Recommendation: Talk to your physician about coming off of these medications. We can assist you with the tapering down and stopping these medicines. Based on the new information, even if you cannot completely stop the medication, a decrease in the dose may be associated with a lesser risk. Ask for other means of controlling the pain. Decrease or eliminate those factors that significantly contribute to your pain such as smoking, obesity, and a diet heavily tilted towards "inflammatory" nutrients.  Last Updated: 07/18/2022   ____________________________________________________________________________________________     ____________________________________________________________________________________________  Transfer of Pain Medication between Pharmacies  Re: 2023 DEA Clarification on existing regulation  Published on DEA Website: September 10, 2021  Title: Revised Regulation Allows DEA-Registered Pharmacies to Transfer Electronic Prescriptions at a Patient's Request DEA Headquarters Division - Public Information Office  "Patients now have the ability to request their electronic prescription be transferred to another pharmacy without having to go back to their practitioner to initiate the   request. This revised regulation went into effect on Monday, September 06, 2021.     At a patient's request, a DEA-registered retail pharmacy can now transfer an electronic prescription for a controlled substance (schedules II-V) to another DEA-registered retail pharmacy. Prior to this change, patients would have to go through their practitioner to cancel their prescription and have it re-issued to a different pharmacy. The process was taxing and time consuming for both patients and practitioners.    The Drug Enforcement Administration (DEA) published its intent to revise the process for transferring electronic prescriptions on November 29, 2019.  The final rule was published in the federal register on August 05, 2021 and went into effect 30 days later.  Under the final rule, a prescription can only be transferred once between pharmacies, and only if allowed under existing state or other applicable law. The prescription must remain in its electronic form; may not be altered in any way; and the transfer must be communicated directly between two licensed pharmacists. It's important to note, any authorized refills transfer with the original prescription, which means the entire prescription will be filled at the same pharmacy."    REFERENCES: 1. DEA website announcement https://www.dea.gov/stories/2023/2023-09/2021-09-01/revised-regulation-allows-dea-registered-pharmacies-transfer  2. Department of Justice website  https://www.govinfo.gov/content/pkg/FR-2021-08-05/pdf/2023-15847.pdf  3. DEPARTMENT OF JUSTICE Drug Enforcement Administration 21 CFR Part 1306 [Docket No. DEA-637] RIN [id number] "Transfer of Electronic Prescriptions for Schedules II-V Controlled Substances Between Pharmacies for Initial Filling"  ____________________________________________________________________________________________     _______________________________________________________________________  Medication Rules  Purpose: To inform patients, and their family members, of our medication rules and regulations.  Applies to: All patients receiving prescriptions from our practice (written or electronic).  Pharmacy of record: This is the pharmacy where your electronic prescriptions will be sent. Make sure we have the correct one.  Electronic prescriptions: In compliance with the [place] Strengthen Opioid Misuse Prevention (STOP) Act of 2017 (Session Law 2017-74/H243), effective January 10, 2018, all controlled substances must be electronically prescribed. Written prescriptions, faxing, or calling prescriptions to a pharmacy will no longer be  done.  Prescription refills: These will be provided only during in-person appointments. No medications will be renewed without a "face-to-face" evaluation with your provider. Applies to all prescriptions.  NOTE: The following applies primarily to controlled substances (Opioid* Pain Medications).   Type of encounter (visit): For patients receiving controlled substances, face-to-face visits are required. (Not an option and not up to the patient.)  Patient's responsibilities: Pain Pills: Bring all pain pills to every appointment (except for procedure appointments). Pill Bottles: Bring pills in original pharmacy bottle. Bring bottle, even if empty. Always bring the bottle of the most recent fill.  Medication refills: You are responsible for knowing and keeping track of what medications you are taking and when is it that you will need a refill. The day before your appointment: write a list of all prescriptions that need to be refilled. The day of the appointment: give the list to the admitting nurse. Prescriptions will be written only during appointments. No prescriptions will be written on procedure days. If you forget a medication: it will not be "Called in", "Faxed", or "electronically sent". You will need to get another appointment to get these prescribed. No early refills. Do not call asking to have your prescription filled early. Partial  or short prescriptions: Occasionally your pharmacy may not have enough pills to fill your prescription.  NEVER ACCEPT a partial fill or a prescription that is short of the total amount of pills that you were prescribed.    With controlled substances the law allows 72 hours for the pharmacy to complete the prescription.  If the prescription is not completed within 72 hours, the pharmacist will require a new prescription to be written. This means that you will be short on your medicine and we WILL NOT send another prescription to complete your original prescription.   Instead, request the pharmacy to send a carrier to a nearby branch to get enough medication to provide you with your full prescription. Prescription Accuracy: You are responsible for carefully inspecting your prescriptions before leaving our office. Have the discharge nurse carefully go over each prescription with you, before taking them home. Make sure that your name is accurately spelled, that your address is correct. Check the name and dose of your medication to make sure it is accurate. Check the number of pills, and the written instructions to make sure they are clear and accurate. Make sure that you are given enough medication to last until your next medication refill appointment. Taking Medication: Take medication as prescribed. When it comes to controlled substances, taking less pills or less frequently than prescribed is permitted and encouraged. Never take more pills than instructed. Never take the medication more frequently than prescribed.  Inform other Doctors: Always inform, all of your healthcare providers, of all the medications you take. Pain Medication from other Providers: You are not allowed to accept any additional pain medication from any other Doctor or Healthcare provider. There are two exceptions to this rule. (see below) In the event that you require additional pain medication, you are responsible for notifying us, as stated below. Cough Medicine: Often these contain an opioid, such as codeine or hydrocodone. Never accept or take cough medicine containing these opioids if you are already taking an opioid* medication. The combination may cause respiratory failure and death. Medication Agreement: You are responsible for carefully reading and following our Medication Agreement. This must be signed before receiving any prescriptions from our practice. Safely store a copy of your signed Agreement. Violations to the Agreement will result in no further prescriptions. (Additional copies of  our Medication Agreement are available upon request.) Laws, Rules, & Regulations: All patients are expected to follow all Federal and State Laws, Statutes, Rules, & Regulations. Ignorance of the Laws does not constitute a valid excuse.  Illegal drugs and Controlled Substances: The use of illegal substances (including, but not limited to marijuana and its derivatives) and/or the illegal use of any controlled substances is strictly prohibited. Violation of this rule may result in the immediate and permanent discontinuation of any and all prescriptions being written by our practice. The use of any illegal substances is prohibited. Adopted CDC guidelines & recommendations: Target dosing levels will be at or below 60 MME/day. Use of benzodiazepines** is not recommended.  Exceptions: There are only two exceptions to the rule of not receiving pain medications from other Healthcare Providers. Exception #1 (Emergencies): In the event of an emergency (i.e.: accident requiring emergency care), you are allowed to receive additional pain medication. However, you are responsible for: As soon as you are able, call our office [phone], at any time of the day or night, and leave a message stating your name, the date and nature of the emergency, and the name and dose of the medication prescribed. In the event that your call is answered by a member of our staff, make sure to document and save the date, time, and the name of the person that took your information.  Exception #2 (  Planned Surgery): In the event that you are scheduled by another doctor or dentist to have any type of surgery or procedure, you are allowed (for a period no longer than 30 days), to receive additional pain medication, for the acute post-op pain. However, in this case, you are responsible for picking up a copy of our "Post-op Pain Management for Surgeons" handout, and giving it to your surgeon or dentist. This document is available at our office,  and does not require an appointment to obtain it. Simply go to our office during business hours (Monday-Thursday from 8:00 AM to 4:00 PM) (Friday 8:00 AM to 12:00 Noon) or if you have a scheduled appointment with us, prior to your surgery, and ask for it by name. In addition, you are responsible for: calling our office [phone], at any time of the day or night, and leaving a message stating your name, name of your surgeon, type of surgery, and date of procedure or surgery. Failure to comply with your responsibilities may result in termination of therapy involving the controlled substances. Medication Agreement Violation. Following the above rules, including your responsibilities will help you in avoiding a Medication Agreement Violation ("Breaking your Pain Medication Contract").  Consequences:  Not following the above rules may result in permanent discontinuation of medication prescription therapy.  *Opioid medications include: morphine, codeine, oxycodone, oxymorphone, hydrocodone, hydromorphone, meperidine, tramadol, tapentadol, buprenorphine, fentanyl, methadone. **Benzodiazepine medications include: diazepam (Valium), alprazolam (Xanax), clonazepam (Klonopine), lorazepam (Ativan), clorazepate (Tranxene), chlordiazepoxide (Librium), estazolam (Prosom), oxazepam (Serax), temazepam (Restoril), triazolam (Halcion) (Last updated: 11/02/2021) ______________________________________________________________________    ______________________________________________________________________  Medication Recommendations and Reminders  Applies to: All patients receiving prescriptions (written and/or electronic).  Medication Rules & Regulations: You are responsible for reading, knowing, and following our "Medication Rules" document. These exist for your safety and that of others. They are not flexible and neither are we. Dismissing or ignoring them is an act of "non-compliance" that may result in  complete and irreversible termination of such medication therapy. For safety reasons, "non-compliance" will not be tolerated. As with the [place] fundamental legal principle of "ignorance of the law is no defense", we will accept no excuses for not having read and knowing the content of documents provided to you by our practice.  Pharmacy of record:  Definition: This is the pharmacy where your electronic prescriptions will be sent.  We do not endorse any particular pharmacy. It is up to you and your insurance to decide what pharmacy to use.  We do not restrict you in your choice of pharmacy. However, once we write for your prescriptions, we will NOT be re-sending more prescriptions to fix restricted supply problems created by your pharmacy, or your insurance.  The pharmacy listed in the electronic medical record should be the one where you want electronic prescriptions to be sent. If you choose to change pharmacy, simply notify our nursing staff. Changes will be made only during your regular appointments and not over the phone.  Recommendations: Keep all of your pain medications in a safe place, under lock and key, even if you live alone. We will NOT replace lost, stolen, or damaged medication. We do not accept "Police Reports" as proof of medications having been stolen. After you fill your prescription, take 1 week's worth of pills and put them away in a safe place. You should keep a separate, properly labeled bottle for this purpose. The remainder should be kept in the original bottle. Use this as your primary supply, until it runs out.   Once it's gone, then you know that you have 1 week's worth of medicine, and it is time to come in for a prescription refill. If you do this correctly, it is unlikely that you will ever run out of medicine. To make sure that the above recommendation works, it is very important that you make sure your medication refill appointments are scheduled at least 1 week before you  run out of medicine. To do this in an effective manner, make sure that you do not leave the office without scheduling your next medication management appointment. Always ask the nursing staff to show you in your prescription , when your medication will be running out. Then arrange for the receptionist to get you a return appointment, at least 7 days before you run out of medicine. Do not wait until you have 1 or 2 pills left, to come in. This is very poor planning and does not take into consideration that we may need to cancel appointments due to bad weather, sickness, or emergencies affecting our staff. DO NOT ACCEPT A "Partial Fill": If for any reason your pharmacy does not have enough pills/tablets to completely fill or refill your prescription, do not allow for a "partial fill". The law allows the pharmacy to complete that prescription within 72 hours, without requiring a new prescription. If they do not fill the rest of your prescription within those 72 hours, you will need a separate prescription to fill the remaining amount, which we will NOT provide. If the reason for the partial fill is your insurance, you will need to talk to the pharmacist about payment alternatives for the remaining tablets, but again, DO NOT ACCEPT A PARTIAL FILL, unless you can trust your pharmacist to obtain the remainder of the pills within 72 hours.  Prescription refills and/or changes in medication(s):  Prescription refills, and/or changes in dose or medication, will be conducted only during scheduled medication management appointments. (Applies to both, written and electronic prescriptions.) No refills on procedure days. No medication will be changed or started on procedure days. No changes, adjustments, and/or refills will be conducted on a procedure day. Doing so will interfere with the diagnostic portion of the procedure. No phone refills. No medications will be "called into the pharmacy". No Fax refills. No weekend  refills. No Holliday refills. No after hours refills.  Remember:  Business hours are:  Monday to Thursday 8:00 AM to 4:00 PM Provider's Schedule: Kemar Pandit, MD - Appointments are:  Medication management: Monday and Wednesday 8:00 AM to 4:00 PM Procedure day: Tuesday and Thursday 7:30 AM to 4:00 PM Bilal Lateef, MD - Appointments are:  Medication management: Tuesday and Thursday 8:00 AM to 4:00 PM Procedure day: Monday and Wednesday 7:30 AM to 4:00 PM (Last update: 11/02/2021) ______________________________________________________________________   ____________________________________________________________________________________________  Naloxone Nasal Spray  Why am I receiving this medication? [place] STOP ACT requires that all patients taking high dose opioids or at risk of opioids respiratory depression, be prescribed an opioid reversal agent, such as Naloxone (AKA: Narcan).  What is this medication? NALOXONE (nal OX one) treats opioid overdose, which causes slow or shallow breathing, severe drowsiness, or trouble staying awake. Call emergency services after using this medication. You may need additional treatment. Naloxone works by reversing the effects of opioids. It belongs to a group of medications called opioid blockers.  COMMON BRAND NAME(S): Kloxxado, Narcan  What should I tell my care team before I take this medication? They need to know if you have   any of these conditions: Heart disease Substance use disorder An unusual or allergic reaction to naloxone, other medications, foods, dyes, or preservatives Pregnant or trying to get pregnant Breast-feeding  When to use this medication? This medication is to be used for the treatment of respiratory depression (less than 8 breaths per minute) secondary to opioid overdose.   How to use this medication? This medication is for use in the nose. Lay the person on their back. Support their neck with your hand  and allow the head to tilt back before giving the medication. The nasal spray should be given into 1 nostril. After giving the medication, move the person onto their side. Do not remove or test the nasal spray until ready to use. Get emergency medical help right away after giving the first dose of this medication, even if the person wakes up. You should be familiar with how to recognize the signs and symptoms of a narcotic overdose. If more doses are needed, give the additional dose in the other nostril. Talk to your care team about the use of this medication in children. While this medication may be prescribed for children as young as newborns for selected conditions, precautions do apply.  Naloxone Overdosage: If you think you have taken too much of this medicine contact a poison control center or emergency room at once.  NOTE: This medicine is only for you. Do not share this medicine with others.  What if I miss a dose? This does not apply.  What may interact with this medication? This is only used during an emergency. No interactions are expected during emergency use. This list may not describe all possible interactions. Give your health care provider a list of all the medicines, herbs, non-prescription drugs, or dietary supplements you use. Also tell them if you smoke, drink alcohol, or use illegal drugs. Some items may interact with your medicine.  What should I watch for while using this medication? Keep this medication ready for use in the case of an opioid overdose. Make sure that you have the phone number of your care team and local hospital ready. You may need to have additional doses of this medication. Each nasal spray contains a single dose. Some emergencies may require additional doses. After use, bring the treated person to the nearest hospital or call 911. Make sure the treating care team knows that the person has received a dose of this medication. You will receive additional  instructions on what to do during and after use of this medication before an emergency occurs.  What side effects may I notice from receiving this medication? Side effects that you should report to your care team as soon as possible: Allergic reactions--skin rash, itching, hives, swelling of the face, lips, tongue, or throat Side effects that usually do not require medical attention (report these to your care team if they continue or are bothersome): Constipation Dryness or irritation inside the nose Headache Increase in blood pressure Muscle spasms Stuffy nose Toothache This list may not describe all possible side effects. Call your doctor for medical advice about side effects. You may report side effects to FDA at 1-800-FDA-1088.  Where should I keep my medication? Because this is an emergency medication, you should keep it with you at all times.  Keep out of the reach of children and pets. Store between 20 and 25 degrees C (68 and 77 degrees F). Do not freeze. Throw away any unused medication after the expiration date. Keep in original box   until ready to use.  NOTE: This sheet is a summary. It may not cover all possible information. If you have questions about this medicine, talk to your doctor, pharmacist, or health care provider.   2023 [employer] Standard (2020-09-04 00:00:00)  ____________________________________________________________________________________________   

## 2022-07-20 NOTE — Progress Notes (Signed)
PROVIDER NOTE: Information contained herein reflects review and annotations entered in association with encounter. Interpretation of such information and data should be left to medically-trained personnel. Information provided to patient can be located elsewhere in the medical record under "Patient Instructions". Document created using STT-dictation technology, any transcriptional errors that may result from process are unintentional.    Patient: Laura [name]  Service Category: E/M  Provider: Oswaldo Done, MD  DOB: November 18, 1945  DOS: 07/20/2022  Referring Provider: Lynnea Ferrier, MD  MRN: [id number]  Specialty: Interventional Pain Management  PCP: Lynnea Ferrier, MD  Type: Established Patient  Setting: Ambulatory outpatient    Location: Office  Delivery: Face-to-face     HPI  [name], a 77 y.o. year old female, is here today because of her Chronic pain syndrome [G89.4]. 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); Lumbar foraminal stenosis (Bilateral L3-4 and L5-S1); Chronic hip pain (Left); Osteoarthritis of knee (Right); Arthritis; Fibromyalgia syndrome; Chronic arthralgias of knees and hips (Right); Chronic hand pain (Left); Dropfoot (Left); Weakness of foot (Left); Sciatica (Left); Weakness of leg (Left); Chronic lower extremity pain (2ry area of Pain) (Bilateral); Lumbosacral radiculopathy at L5 (Left); Abnormal MRI, lumbar spine (04/30/2021); DDD  (degenerative disc disease), lumbosacral; Statin myopathy; Foot drop (Left); Ligamentum flavum hypertrophy (L1-2, L2-3, L3-4); Lumbar lateral recess stenosis (Bilateral) (L2-3, L3-4, L4-5) (Severe: L3-4); Lumbar Grade 1 Anterolisthesis of L3/L4 and L4/L5; Intractable back pain; Generalized osteoarthritis of multiple sites; and Chronic low back pain (1ry area of Pain) (Bilateral) (L>R) w/o sciatica on their pertinent problem list. Pain Assessment: Severity of Chronic pain is reported as a 5 /10. Location: Back Lower/backs of both legs. Onset:  . Quality: Burning. Timing: Constant. Modifying factor(s): medication, Tylenol. Vitals:  height is 5\' 4"  (1.626 m) and weight is 240 lb (108.9 kg). Her temporal temperature is 97.3 F (36.3 C) (abnormal). Her blood pressure is 147/89 (abnormal) and her pulse is 73. Her respiration is 16 and oxygen saturation is 97%.  BMI: Estimated body mass index is 41.2 kg/m as calculated from the following:   Height as of this encounter: 5\' 4"  (1.626 m).   Weight as of this encounter: 240 lb (108.9 kg). Last encounter: 07/11/2022. Last procedure: 10/21/2021.  Reason for encounter: medication management.  The patient indicates doing well with the current medication regimen. No adverse reactions or side effects reported to the medications.   In multiple locations I have had a conversation with the patient about her lumbar spine and the problems associated with it.  She had an MRI of the lumbar spine done on 04/29/2021 which shows advanced lumbar spine degeneration with L3-4 and L4-5 anterolisthesis.  In addition the patient has severe spinal stenosis at the L3-4 level and moderate to advanced spinal stenosis at the L2-3 and L4-5 level with bilateral L5 impingement at the subarticular recess of L4-5.  Her condition has advanced to the point where I believe she needs decompressive surgery.  However, she keeps avoiding this.  By now the patient has urinary and fecal incontinence likely  to be neurogenic.  She also indicates having absolutely no sensation that she needs to void.  For this reason, she has been wearing a diaper for the past year or so.  For the same reason, we are unable to get the standard UDS to have evaluate her opioid use.  Today we will be ordering a blood drug screening test.  I have informed her that it is not likely that she will improve if surgery is not undertaken.  I have already referred this patient to neurosurgery on 02/19/2020, 07/07/2020, and 12/22/2020.  Routine UDS ordered today.  (Patient indicates being unable to provide sample) for this reason we will be ordering blood drug screening test.  Today again I went over all of the above information with the patient and her daughter and they do understand the difficulty of the situation and the fact that the these problems by now are likely to be permanent.  I have entered the referral for rheumatology as they have requested since the daughter refers that she has noticed that when she takes the methotrexate she normally gets some relief of the pain that goes along with the time that she takes it but by the end of the week her pain is worsening.  She apparently takes the methotrexate on Mondays.  RTCB: 10/18/2022   Pharmacotherapy Assessment  Analgesic: Oxycodone IR 5 mg every 6 hours (20 mg/day) MME/day: 30 mg/day.   Monitoring: [place] PMP: PDMP reviewed during this encounter.       Pharmacotherapy: No side-effects or adverse reactions reported. Compliance: No problems identified. Effectiveness: Clinically acceptable.  Concepcion Elk, RN  07/20/2022  2:06 PM  Sign when Signing Visit Nursing Pain Medication Assessment:  Safety precautions to be maintained throughout the outpatient stay will include: orient to surroundings, keep bed in low position, maintain call bell within reach at all times, provide assistance with transfer out of bed and ambulation.  Medication Inspection Compliance: Laura [name] did  not comply with our request to bring her pills to be counted. She was reminded that bringing the medication bottles, even when empty, is a requirement.  Medication: None brought in. Pill/Patch Count: None available to be counted. Bottle Appearance: No container available. Did not bring bottle(s) to appointment. Filled Date: N/A Last Medication intake:  Today  Did not bring pill bottle, brought pill box, states there are 2 pills in it.    No results found for: "CBDTHCR" No results found for: "D8THCCBX" No results found for: "D9THCCBX"  UDS:  Summary  Date Value Ref Range Status  06/22/2021 Note  Final    Comment:    ==================================================================== [facility] (MW) ==================================================================== Test                             Result       Flag       Units  Drug Present and Declared for Prescription Verification   Oxycodone                      1634         EXPECTED   ng/mg creat   Oxymorphone                    770          EXPECTED   ng/mg creat   Noroxycodone  2933         EXPECTED   ng/mg creat   Noroxymorphone                 191          EXPECTED   ng/mg creat    Sources of oxycodone are scheduled prescription medications.    Oxymorphone, noroxycodone, and noroxymorphone are expected    metabolites of oxycodone. Oxymorphone is also available as a    scheduled prescription medication.  ==================================================================== Test                      Result    Flag   Units      Ref Range   Creatinine              94               mg/dL      >=16 ==================================================================== Declared Medications:  The flagging and interpretation on this report are based on the  following declared medications.  Unexpected results may arise from  inaccuracies in the declared medications.   **Note: The testing scope of this  panel includes these medications:   Oxycodone (OxyIR)   **Note: The testing scope of this panel does not include the  following reported medications:   Albuterol (Ventolin HFA)  Aspirin  Azelastine (Astelin)  Duloxetine (Cymbalta)  Folic Acid  Furosemide (Lasix)  Hydroxychloroquine (Plaquenil)  Hydroxyzine (Vistaril)  Infliximab (Remicade)  Levothyroxine (Synthroid)  Loratadine (Claritin)  Methotrexate  Metoprolol (Toprol)  Multivitamin  Ondansetron (Zofran)  Rosuvastatin (Crestor)  Spironolactone (Aldactone)  Trazodone (Desyrel)  Vitamin D ==================================================================== For clinical consultation, please call [phone]. ====================================================================       ROS  Constitutional: Denies any fever or chills Gastrointestinal: No reported hemesis, hematochezia, vomiting, or acute GI distress Musculoskeletal: Denies any acute onset joint swelling, redness, loss of ROM, or weakness Neurological: No reported episodes of acute onset apraxia, aphasia, dysarthria, agnosia, amnesia, paralysis, loss of coordination, or loss of consciousness  Medication Review  Calcium-Magnesium-Vitamin D, Cholecalciferol, DULoxetine, Lactobacillus, albuterol, aspirin EC, calcium carbonate, docusate sodium, doxepin, folic acid, furosemide, hydroxychloroquine, inFLIXimab, levothyroxine, loratadine, methotrexate, metoprolol succinate, naloxone, oxyCODONE, potassium chloride, and rosuvastatin  History Review  Allergy: Laura [name] is allergic to bupropion, jardiance [empagliflozin], and penicillins. Drug: Laura [name]  reports no history of drug use. Alcohol:  reports no history of alcohol use. Tobacco:  reports that she has never smoked. She has never used smokeless tobacco. Social: Laura [name]  reports that she has never smoked. She has never used smokeless tobacco. She reports that she does not drink alcohol and does not  use drugs. Medical:  has a past medical history of Allergy, Anxiety, Arthritis, degenerative (10/08/2013), Asthma, Chronic hand pain, left (10/10/2019), Chronic kidney disease, Depression, Lumbar spinal stenosis with neurogenic claudication (11/07/2014), Major depression, single episode, in complete remission (HCC) (06/25/2015), Memory loss, short term (03/19/2014), Rheumatoid arthritis (HCC), Sciatica of left side (12/31/2019), Seizure (HCC) (10/07/2014), Sleep apnea, Sleep apnea, and Thyroid disease. Surgical: Ms. Sisler  has a past surgical history that includes Abdominal hysterectomy; Cesarean section; Replacement total knee (Left); Hip surgery (Right); and Orif pelvic fracture with percutaneous screws (Bilateral, 02/02/2022). Family: family history includes Alcohol abuse in her father; Depression in her father and sister; Heart attack in her father; Hypertension in her mother and sister; Post-traumatic stress disorder in her father; Rheum arthritis in her sister; Stroke in her mother.  Laboratory Chemistry Profile   Renal  Lab Results  Component Value Date   BUN 31 (H) 02/04/2022   CREATININE 1.10 (H) 02/04/2022   GFRAA 57 (L) 10/03/2019   GFRNONAA 52 (L) 02/04/2022    Hepatic Lab Results  Component Value Date   AST 13 (L) 02/04/2022   ALT 6 02/04/2022   ALBUMIN 2.7 (L) 02/04/2022   ALKPHOS 78 02/04/2022   LIPASE 22 01/14/2021    Electrolytes Lab Results  Component Value Date   NA 136 02/04/2022   K 4.0 02/04/2022   CL 96 (L) 02/04/2022   CALCIUM 8.3 (L) 02/04/2022   MG 2.3 10/08/2018    Bone Lab Results  Component Value Date   VD25OH See Scanned report in [facility] 02/03/2022   25OHVITD1 36 07/15/2015   25OHVITD2 3.6 07/15/2015   25OHVITD3 32 07/15/2015    Inflammation (CRP: Acute Phase) (ESR: Chronic Phase) Lab Results  Component Value Date   CRP 1.9 (H) 07/15/2015   ESRSEDRATE 32 (H) 07/15/2015   LATICACIDVEN 1.4 01/31/2022         Note: Above Lab results  reviewed.  Recent Imaging Review  DG Pelvis Comp Min 3V CLINICAL DATA:  [id number] with sacral fracture noted on CT.  EXAM: JUDET PELVIS - 3+ VIEW  COMPARISON:  Intraoperative spot fluoroscopic radiographs during iliosacral screw fixation earlier today.  FINDINGS: Judet pelvic views again demonstrate a single iliosacral screw fixation. Several incomplete clefts in the screw are again shown. Positioning and alignment are unchanged.  There is osteopenia, old right hip nailing and advanced bilateral degenerative hip arthrosis with enthesopathic change of the pelvis.  IMPRESSION: No interval change in the appearance of the iliosacral screw fixation. Several incomplete clefts in the screw are again shown.  Electronically Signed   By: Almira Bar M.D.   On: 02/03/2022 05:29 Note: Reviewed        Physical Exam  General appearance: Well nourished, well developed, and well hydrated. In no apparent acute distress Mental status: Alert, oriented x 3 (person, place, & time)       Respiratory: No evidence of acute respiratory distress Eyes: PERLA Vitals: BP (!) 147/89   Pulse 73   Temp (!) 97.3 F (36.3 C) (Temporal)   Resp 16   Ht 5\' 4"  (1.626 m)   Wt 240 lb (108.9 kg)   SpO2 97%   BMI 41.20 kg/m  BMI: Estimated body mass index is 41.2 kg/m as calculated from the following:   Height as of this encounter: 5\' 4"  (1.626 m).   Weight as of this encounter: 240 lb (108.9 kg). Ideal: Ideal body weight: 54.7 kg (120 lb 9.5 oz) Adjusted ideal body weight: 76.4 kg (168 lb 5.7 oz)  Assessment   Diagnosis Status  1. Chronic pain syndrome   2. Chronic low back pain (1ry area of Pain) (Bilateral) (L>R) w/ sciatica (Bilateral)   3. Chronic lower extremity pain (2ry area of Pain) (Bilateral)   4. Generalized osteoarthritis of multiple sites   5. DDD (degenerative disc disease), lumbosacral   6. Seronegative rheumatoid arthritis (HCC)   7. Lumbar spinal stenosis (5 mm Severe L3-4; 8 mm  L4-5) w/ neurogenic claudication   8. Lumbar facet syndrome (Bilateral) (L>R)   9. Lumbar foraminal stenosis (Bilateral L3-4 and L5-S1)   10. Chronic hip pain (Left)   11. Chronic hand pain (Left)   12. Ligamentum flavum hypertrophy (L1-2, L2-3, L3-4)   13. Chronic knee pain (Right)   14. Lumbar lateral recess stenosis (Bilateral) (L2-3, L3-4, L4-5) (Severe:  L3-4)   15. Lumbar Grade 1 Anterolisthesis of L3/L4 and L4/L5   16. Pharmacologic therapy   17. Chronic use of opiate for therapeutic purpose   18. Encounter for medication management   19. Encounter for chronic pain management    Controlled Controlled Controlled   Updated Problems: No problems updated.  Plan of Care  Problem-specific:  No problem-specific Assessment & Plan notes found for this encounter.  Laura [name] has a current medication list which includes the following long-term medication(s): albuterol, calcium-magnesium-vitamin d, doxepin, duloxetine, infliximab, loratadine, metoprolol succinate, oxycodone, [START ON 08/19/2022] oxycodone, and [START ON 09/18/2022] oxycodone.  Pharmacotherapy (Medications Ordered): Meds ordered this encounter  Medications   oxyCODONE (OXY IR/ROXICODONE) 5 MG immediate release tablet    Sig: Take 1 tablet (5 mg total) by mouth every 6 (six) hours as needed for severe pain. Must last 30 days    Dispense:  120 tablet    Refill:  0    DO NOT: delete (not duplicate); no partial-fill (will deny script to complete), no refill request (F/U required). DISPENSE: 1 day early if closed on fill date. WARN: No CNS-depressants within 8 hrs of med.   oxyCODONE (OXY IR/ROXICODONE) 5 MG immediate release tablet    Sig: Take 1 tablet (5 mg total) by mouth every 6 (six) hours as needed for severe pain. Must last 30 days    Dispense:  120 tablet    Refill:  0    DO NOT: delete (not duplicate); no partial-fill (will deny script to complete), no refill request (F/U required). DISPENSE: 1 day early  if closed on fill date. WARN: No CNS-depressants within 8 hrs of med.   oxyCODONE (OXY IR/ROXICODONE) 5 MG immediate release tablet    Sig: Take 1 tablet (5 mg total) by mouth every 6 (six) hours as needed for severe pain. Must last 30 days    Dispense:  120 tablet    Refill:  0    DO NOT: delete (not duplicate); no partial-fill (will deny script to complete), no refill request (F/U required). DISPENSE: 1 day early if closed on fill date. WARN: No CNS-depressants within 8 hrs of med.   Orders:  Orders Placed This Encounter  Procedures   Drug Screen 10 W/Conf, Serum    Order Specific Question:   Release to patient    Answer:   Immediate   Ambulatory referral to Rheumatology    Referral Priority:   Routine    Referral Type:   Consultation    Referral Reason:   Specialty Services Required    Requested Specialty:   Rheumatology    Number of Visits Requested:   1   Nursing Instructions:    1). STAT: UDS required today. 2). Make sure to document all opioids and benzodiazepines taken, including time of last intake. 3). If order is entered on a procedure day, make sure sample is obtained before any medications are administered.   Follow-up plan:   Return in about 3 months (around 10/18/2022) for Eval-day (M,W), (F2F), (MM).      Interventional Therapies  Risk  Complexity Considerations:   Estimated body mass index is 38.27 kg/m as calculated from the following:   Height as of 12/22/20: 5\' 5"  (1.651 m).   Weight as of 12/22/20: 230 lb (104.3 kg). PLAQUENIL Anticoagulation (Stop: 11 days)   Planned  Pending:   Therapeutic midline L2-3 LESI #4    Under consideration:   Diagnostic bilateral genicular NB  Diagnostic bilateral L3 and/or  L5 TFESI   Completed:   Therapeutic right IA steroid knee injection x2 (10/15/2015)  Therapeutic right Hyalgan knee injection x1 (09/01/2016)  Therapeutic left lumbar facet block x1 (01/22/2015)  Therapeutic left IA hip injection x1 (03/17/2015)   Therapeutic left L4-5 LESI x1 (02/04/2020) (100/100/85/85)  Therapeutic midline L2-3 LESI x3 (12/22/2020) (95/95/95/95)    Therapeutic  Palliative (PRN) options:   Therapeutic right IA Hyalgan knee injections Palliative left IA hip joint injection  Diagnostic left lumbar facet MBB #2        Recent Visits Date Type Provider Dept  07/11/22 Office Visit Delano Metz, MD [facility]  Showing recent visits within past 90 days and meeting all other requirements Today's Visits Date Type Provider Dept  07/20/22 Office Visit Delano Metz, MD [facility]  Showing today's visits and meeting all other requirements Future Appointments No visits were found meeting these conditions. Showing future appointments within next 90 days and meeting all other requirements  I discussed the assessment and treatment plan with the patient. The patient was provided an opportunity to ask questions and all were answered. The patient agreed with the plan and demonstrated an understanding of the instructions.  Patient advised to call back or seek an in-person evaluation if the symptoms or condition worsens.  Duration of encounter: 30 minutes.  Total time on encounter, as per AMA guidelines included both the face-to-face and non-face-to-face time personally spent by the physician and/or other qualified health care professional(s) on the day of the encounter (includes time in activities that require the physician or other qualified health care professional and does not include time in activities normally performed by clinical staff). Physician's time may include the following activities when performed: Preparing to see the patient (e.g., pre-charting review of records, searching for previously ordered imaging, lab work, and nerve conduction tests) Review of prior analgesic pharmacotherapies. Reviewing PMP Interpreting ordered tests (e.g., lab work, imaging, nerve conduction  tests) Performing post-procedure evaluations, including interpretation of diagnostic procedures Obtaining and/or reviewing separately obtained history Performing a medically appropriate examination and/or evaluation Counseling and educating the patient/family/caregiver Ordering medications, tests, or procedures Referring and communicating with other health care professionals (when not separately reported) Documenting clinical information in the electronic or other health record Independently interpreting results (not separately reported) and communicating results to the patient/ family/caregiver Care coordination (not separately reported)  Note by: Oswaldo Done, MD Date: 07/20/2022; Time: 2:44 PM

## 2022-07-20 NOTE — Progress Notes (Signed)
Nursing Pain Medication Assessment:  Safety precautions to be maintained throughout the outpatient stay will include: orient to surroundings, keep bed in low position, maintain call bell within reach at all times, provide assistance with transfer out of bed and ambulation.  Medication Inspection Compliance: Ms. Wirkkala did not comply with our request to bring her pills to be counted. She was reminded that bringing the medication bottles, even when empty, is a requirement.  Medication: None brought in. Pill/Patch Count: None available to be counted. Bottle Appearance: No container available. Did not bring bottle(s) to appointment. Filled Date: N/A Last Medication intake:  Today  Did not bring pill bottle, brought pill box, states there are 2 pills in it.

## 2022-07-25 LAB — DRUG SCREEN 10 W/CONF, SERUM
Amphetamines, IA: NEGATIVE ng/mL
Barbiturates, IA: NEGATIVE ug/mL
Benzodiazepines, IA: NEGATIVE ng/mL
Cocaine & Metabolite, IA: NEGATIVE ng/mL
Methadone, IA: NEGATIVE ng/mL
Opiates, IA: NEGATIVE ng/mL
Phencyclidine, IA: NEGATIVE ng/mL
Propoxyphene, IA: NEGATIVE ng/mL
THC(Marijuana) Metabolite, IA: NEGATIVE ng/mL

## 2022-07-25 LAB — OXYCODONES,MS,WB/SP RFX

## 2022-08-01 ENCOUNTER — Encounter: Payer: Self-pay | Admitting: Psychiatry

## 2022-08-01 ENCOUNTER — Telehealth: Payer: Medicare Other | Admitting: Psychiatry

## 2022-08-01 DIAGNOSIS — F330 Major depressive disorder, recurrent, mild: Secondary | ICD-10-CM

## 2022-08-01 DIAGNOSIS — F5101 Primary insomnia: Secondary | ICD-10-CM | POA: Diagnosis not present

## 2022-08-01 MED ORDER — DULOXETINE HCL 20 MG PO CPEP
20.0000 mg | ORAL_CAPSULE | Freq: Every day | ORAL | 0 refills | Status: DC
Start: 2022-08-01 — End: 2022-08-29

## 2022-08-01 NOTE — Patient Instructions (Addendum)
[email or web address]  www.[email or web address]  [employer] [email or web address] [address], [place], [place] [address]   [phone]  [facility], [facility] [email or web address] [address] [place], [place] [address]   [phone]   [facility] - [phone]  [facility] - [phone]  [facility] - [phone] counseling [phone]  [facility] [phone]   [email or web address] this clinician can offer telehealth and has a sliding scale option  [email or web address] this group also offers sliding scale rates and is based out of [place]   [employer] [facility] has interns who offer sliding scale rates and some of the full time clinicians do, as well. You complete their contact form on their website and the referrals coordinator will help to get connected to someone   Medicaid below :  [facility] [address] [place], [place] [address]  [phone]    [employer] [address] [place], [place] [address]  [phone]    [facility] [address] [place], [place] [address]  [phone]   Duloxetine Delayed-Release Capsules What is this medication? DULOXETINE (doo LOX e teen) treats depression, anxiety, fibromyalgia, and certain types of chronic pain such as nerve, bone, or joint pain. It increases the amount of serotonin and norepinephrine in the brain, hormones that help regulate mood and pain. It belongs to a group of medications called SNRIs. This medicine may be used for other purposes; ask your health care provider or pharmacist if you have questions. COMMON BRAND NAME(S): Cymbalta, Drizalma, Irenka What should I tell my care team before I take this medication? They  need to know if you have any of these conditions: Bipolar disorder Glaucoma High blood pressure Kidney disease Liver disease Seizures Suicidal thoughts, plans, or attempt by you or a family member Take medications that treat or prevent blood clots Taken an MAOI, such as Carbex, Eldepryl, Marplan, Nardil, or Parnate in the last 14 days Trouble passing urine An unusual reaction to duloxetine, other medications, foods, dyes, or preservatives Pregnant or trying to get pregnant Breastfeeding How should I use this medication? Take this medication by mouth with water. Take it as directed on the prescription label at the same time every day. Do not cut, crush, or chew this medication. Swallow the capsules whole. Some capsules may be opened and sprinkled on applesauce. Check with your care team or pharmacist if you are not sure. You can take this medication with or without food. Do not take your medication more often than directed. Do not stop taking this medication suddenly except upon the advice of your care team. Stopping this medication too quickly may cause serious side effects or your condition may worsen. A special MedGuide will be given to you by the pharmacist with each prescription and refill. Be sure to read this information carefully each time. Talk to your care team about the use of this medication in children. While it may be prescribed for children as young as 7 years for selected conditions, precautions do apply. Overdosage: If you think you have taken too much of this medicine contact a poison control center or emergency room at once. NOTE: This medicine is only for you. Do not share this medicine with others. What if I miss a dose? If you miss a dose, take it as soon as you can. If it is almost time  for your next dose, take only that dose. Do not take double or extra doses. What may interact with this medication? Do not take this medication with any of the  following: Desvenlafaxine Levomilnacipran Linezolid MAOIs, such as Carbex, Eldepryl, Emsam, Marplan, Nardil, and Parnate Methylene blue (injected into a vein) Milnacipran Safinamide Thioridazine Venlafaxine Viloxazine This medication may also interact with the following: Alcohol Amphetamines Aspirin and aspirin-like medications Certain antibiotics, such as ciprofloxacin and enoxacin Certain medications for blood pressure, heart disease, irregular heart beat Certain medications for mental health conditions Certain medications for migraine headache, such as almotriptan, eletriptan, frovatriptan, naratriptan, rizatriptan, sumatriptan, zolmitriptan Certain medications that treat or prevent blood clots, such as warfarin, enoxaparin, and dalteparin Cimetidine Fentanyl Lithium NSAIDS, medications for pain and inflammation, such as ibuprofen or naproxen Phentermine Procarbazine Rasagiline Sibutramine St. John's Wort Theophylline Tramadol Tryptophan This list may not describe all possible interactions. Give your health care provider a list of all the medicines, herbs, non-prescription drugs, or dietary supplements you use. Also tell them if you smoke, drink alcohol, or use illegal drugs. Some items may interact with your medicine. What should I watch for while using this medication? Tell your care team if your symptoms do not get better or if they get worse. Visit your care team for regular checks on your progress. Because it may take several weeks to see the full effects of this medication, it is important to continue your treatment as prescribed by your care team. This medication may cause serious skin reactions. They can happen weeks to months after starting the medication. Contact your care team right away if you notice fevers or flu-like symptoms with a rash. The rash may be red or purple and then turn into blisters or peeling of the skin. You may also notice a red rash with swelling  of the face, lips, or lymph nodes in your neck or under your arms. Watch for new or worsening thoughts of suicide or depression. This includes sudden changes in mood, behaviors, or thoughts. These changes can happen at any time but are more common in the beginning of treatment or after a change in dose. Call your care team right away if you experience these thoughts or worsening depression. This medication may cause mood and behavior changes, such as anxiety, nervousness, irritability, hostility, restlessness, excitability, hyperactivity, or trouble sleeping. These changes can happen at any time but are more common in the beginning of treatment or after a change in dose. Call your care team right away if you notice any of these symptoms. This medication may affect your coordination, reaction time, or judgment. Do not drive or operate machinery until you know how this medication affects you. Sit up or stand slowly to reduce the risk of dizzy or fainting spells. Drinking alcohol with this medication can increase the risk of these side effects. This medication may increase blood sugar. The risk may be higher in patients who already have diabetes. Ask your care team what you can do to lower your risk of diabetes while taking this medication. This medication can cause an increase in blood pressure. This medication can also cause a sudden drop in your blood pressure, which may make you feel faint and increase the chance of a fall. These effects are most common when you first start the medication or when the dose is increased, or during use of other medications that can cause a sudden drop in blood pressure. Check with your care team for instructions on monitoring your blood  pressure while taking this medication. Your mouth may get dry. Chewing sugarless gum or sucking hard candy and drinking plenty of water may help. Contact your care team if the problem does not go away or is severe. What side effects may I notice  from receiving this medication? Side effects that you should report to your care team as soon as possible: Allergic reactions--skin rash, itching, hives, swelling of the face, lips, tongue, or throat Bleeding--bloody or black, tar-like stools, red or dark brown urine, vomiting blood or brown material that looks like coffee grounds, small, red or purple spots on skin, unusual bleeding or bruising Increase in blood pressure Liver injury--right upper belly pain, loss of appetite, nausea, light-colored stool, dark yellow or brown urine, yellowing skin or eyes, unusual weakness or fatigue Low sodium level--muscle weakness, fatigue, dizziness, headache, confusion Redness, blistering, peeling, or loosening of the skin, including inside the mouth Serotonin syndrome--irritability, confusion, fast or irregular heartbeat, muscle stiffness, twitching muscles, sweating, high fever, seizures, chills, vomiting, diarrhea Sudden eye pain or change in vision such as blurry vision, seeing halos around lights, vision loss Thoughts of suicide or self-harm, worsening mood, feelings of depression Trouble passing urine Side effects that usually do not require medical attention (report to your care team if they continue or are bothersome): Change in sex drive or performance Constipation Diarrhea Dizziness Dry mouth Excessive sweating Loss of appetite Nausea Vomiting This list may not describe all possible side effects. Call your doctor for medical advice about side effects. You may report side effects to FDA at 1-800-FDA-1088. Where should I keep my medication? Keep out of the reach of children and pets. Store at room temperature between 15 and 30 degrees C (59 to 86 degrees F). Get rid of any unused medication after the expiration date. To get rid of medications that are no longer needed or have expired: Take the medication to a medication take-back program. Check with your pharmacy or law enforcement to find a  location. If you cannot return the medication, check the label or package insert to see if the medication should be thrown out in the garbage or flushed down the toilet. If you are not sure, ask your care team. If it is safe to put it in the trash, take the medication out of the container. Mix the medication with cat litter, dirt, coffee grounds, or other unwanted substance. Seal the mixture in a bag or container. Put it in the trash. NOTE: This sheet is a summary. It may not cover all possible information. If you have questions about this medicine, talk to your doctor, pharmacist, or health care provider.  2024 Elsevier/Gold Standard (2021-09-30 00:00:00)

## 2022-08-01 NOTE — Progress Notes (Unsigned)
Virtual Visit via Video Note  I connected with Laura [name] on 08/01/22 at  2:30 PM EDT by a video enabled telemedicine application and verified that I am speaking with the correct person using two identifiers.  Location Provider Location : [facility] Patient Location : Home  Participants: Patient ,Daughter, Provider    I discussed the limitations of evaluation and management by telemedicine and the availability of in person appointments. The patient expressed understanding and agreed to proceed.   I discussed the assessment and treatment plan with the patient. The patient was provided an opportunity to ask questions and all were answered. The patient agreed with the plan and demonstrated an understanding of the instructions.   The patient was advised to call back or seek an in-person evaluation if the symptoms worsen or if the condition fails to improve as anticipated.    [facility] MD OP Progress Note  08/01/2022 3:30 PM Laura [name]  MRN:  [id number]  Chief Complaint:  Chief Complaint  Patient presents with   Follow-up   Anxiety   Depression   Medication Refill   HPI: Laura [name] is a 77 year old Caucasian female who has a history of primary insomnia, chronic pain, lumbar spinal stenosis with neurogenic claudication, rheumatoid arthritis, history of total knee replacement left, fibromyalgia, osteoarthritis of the bilateral hips, left foot drop, thyroid disease, multiple other medical problems was evaluated by telemedicine today.  Patient today appeared to be cheerful, pleasant and talkative in session.  Patient reports she is in a lot of pain right now and is only able to ambulate in a wheelchair.  Patient reports she also has urinary incontinence and has to keep changing diapers.  Recently it got to a point where it got frustrating for her.  Patient reports she became depressed at that time and made some comments about not wanting to live.  Patient however reports she did not  really mean it and said it because she was just frustrated.  She currently denies any suicidality.  Patient continues to have good days and bad days.  She reports it all depends on her medical issues and the pain that she is struggling with.  She reports she also gets lonely often because she is not able to ambulate or meet people.  She calls herself a ' people's person" and reports she has no way of meeting people at this time due to her medical problems and limitations from it.  Patient however reports she is blessed by the fact that she has good support system from her spouse, daughter and her daughter's husband.   Patient appeared to be alert, oriented to person and situation and place.  Patient denies any homicidality or perceptual disturbances.  Patient reports she is currently compliant on the Cymbalta, denies side effects.  She is also on doxepin which was added by primary care provider and that helps to some extent with her sleep.  Collateral information obtained from daughter Laura [name] who reported that, ' last week patient went through an episode of significant depression and made some comments about not wanting to be here because of her pain.  She understands that she is having a lot of trouble from her pain and is planning to contact her pain provider for further recommendations.  Daughter also would like to know if anything else can be done regarding her medications to help her with her mood symptoms.  Daughter agreeable to dosage increase of duloxetine.  Also agreeable to referral to psychotherapist.'    Visit  Diagnosis:    ICD-10-CM   1. MDD (major depressive disorder), recurrent episode, mild (HCC)  F33.0 DULoxetine (CYMBALTA) 20 MG capsule    2. Primary insomnia  F51.01 DULoxetine (CYMBALTA) 20 MG capsule      Past Psychiatric History: I have reviewed past psychiatric history from progress note on 06/13/2018.  Past Medical History:  Past Medical History:  Diagnosis Date    Allergy    Anxiety    Arthritis, degenerative 10/08/2013   Overview:    a.  Lumbar spine/spinal stenosis/foot drop.   b.  Hands.    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   Memory loss, short term 03/19/2014   Rheumatoid arthritis (HCC)    Sciatica of left side 12/31/2019   Seizure (HCC) 10/07/2014   Sleep apnea    Sleep apnea    Thyroid disease     Past Surgical History:  Procedure Laterality Date   ABDOMINAL HYSTERECTOMY     CESAREAN SECTION     HIP SURGERY Right    ORIF PELVIC FRACTURE WITH PERCUTANEOUS SCREWS Bilateral 02/02/2022   Procedure: ORIF PELVIC FRACTURE WITH PERCUTANEOUS SCREWS;  Surgeon: Roby Lofts, MD;  Location: [facility] OR;  Service: Orthopedics;  Laterality: Bilateral;   REPLACEMENT TOTAL KNEE Left     Family Psychiatric History: I have reviewed family psychiatric history from progress note on 06/13/2018.  Family History:  Family History  Problem Relation Age of Onset   Hypertension Mother    Stroke Mother    Heart attack Father    Alcohol abuse Father    Depression Father    Post-traumatic stress disorder Father    Rheum arthritis Sister    Hypertension Sister    Depression Sister    Breast cancer Neg Hx     Social History: I have reviewed social history from progress note on 06/13/2018. Social History   Socioeconomic History   Marital status: Married    Spouse name: Not on file   Number of children: Not on file   Years of education: Not on file   Highest education level: Not on file  Occupational History    Comment: retired  Tobacco Use   Smoking status: Never   Smokeless tobacco: Never  Vaping Use   Vaping status: Never Used  Substance and Sexual Activity   Alcohol use: No    Alcohol/week: 0.0 standard drinks of alcohol   Drug use: No   Sexual activity:  Not Currently  Other Topics Concern   Not on file  Social History Narrative   Not on file   Social Determinants of Health   Financial Resource Strain: Low Risk  (11/15/2016)   Overall Financial Resource Strain (CARDIA)    Difficulty of Paying Living Expenses: Not hard at all  Food Insecurity: No Food Insecurity (02/01/2022)   Hunger Vital Sign    Worried About Running Out of Food in the Last Year: Never true    Ran Out of Food in the Last Year: Never true  Transportation Needs: No Transportation Needs (02/01/2022)   PRAPARE - Administrator, Civil Service (Medical): No    Lack of Transportation (Non-Medical): No  Physical Activity: Inactive (11/15/2016)   Exercise Vital Sign    Days of Exercise per Week: 0 days    Minutes of Exercise  per Session: 0 min  Stress: No Stress Concern Present (11/15/2016)   [employer] of Occupational Health - Occupational Stress Questionnaire    Feeling of Stress : Not at all  Social Connections: Somewhat Isolated (11/15/2016)   Social Connection and Isolation Panel [NHANES]    Frequency of Communication with Friends and Family: More than three times a week    Frequency of Social Gatherings with Friends and Family: Once a week    Attends Religious Services: Never    Database administrator or Organizations: No    Attends Banker Meetings: Never    Marital Status: Married    Allergies:  Allergies  Allergen Reactions   Bupropion Other (See Comments)    Siezures   Jardiance [Empagliflozin] Other (See Comments)    Projectile vomiting   Penicillins Nausea And Vomiting and Rash    Metabolic Disorder Labs: Lab Results  Component Value Date   HGBA1C 6.8 (H) 02/02/2022   MPG 148.46 02/02/2022   No results found for: "PROLACTIN" No results found for: "CHOL", "TRIG", "HDL", "CHOLHDL", "VLDL", "LDLCALC" Lab Results  Component Value Date   TSH 0.383 10/07/2018    Therapeutic Level Labs: No results found for:  "LITHIUM" No results found for: "VALPROATE" No results found for: "CBMZ"  Current Medications: Current Outpatient Medications  Medication Sig Dispense Refill   albuterol (VENTOLIN HFA) 108 (90 Base) MCG/ACT inhaler Inhale 2 puffs into the lungs every 6 (six) hours as needed for wheezing or shortness of breath. 18 g 3   aspirin EC 325 MG tablet Take 325 mg by mouth daily.     calcium carbonate (TUMS - DOSED IN MG ELEMENTAL CALCIUM) 500 MG chewable tablet Chew 1 tablet by mouth daily.     Calcium-Magnesium-Vitamin D (CALCIUM 1200+D3 PO) Take by mouth. 2 tablets daily     Cholecalciferol (VITAMIN D3 PO) Take by mouth daily. With Mg     docusate sodium (COLACE) 100 MG capsule Take 100 mg by mouth. Tuesday and Thursday     doxepin (SINEQUAN) 10 MG capsule Take 20 mg by mouth at bedtime.     DULoxetine (CYMBALTA) 20 MG capsule Take 1 capsule (20 mg total) by mouth daily. Take along with 60 mg daily. 90 capsule 0   DULoxetine (CYMBALTA) 60 MG capsule TAKE 1 CAPSULE BY MOUTH EVERY DAY 90 capsule 0   esomeprazole (NEXIUM) 20 MG capsule Take 20 mg by mouth daily at 12 noon.     oxyCODONE (OXY IR/ROXICODONE) 5 MG immediate release tablet Take 1 tablet (5 mg total) by mouth every 6 (six) hours as needed for severe pain. Must last 30 days 120 tablet 0   [START ON 08/19/2022] oxyCODONE (OXY IR/ROXICODONE) 5 MG immediate release tablet Take 1 tablet (5 mg total) by mouth every 6 (six) hours as needed for severe pain. Must last 30 days 120 tablet 0   [START ON 09/18/2022] oxyCODONE (OXY IR/ROXICODONE) 5 MG immediate release tablet Take 1 tablet (5 mg total) by mouth every 6 (six) hours as needed for severe pain. Must last 30 days 120 tablet 0   folic acid (FOLVITE) 1 MG tablet Take 1 mg by mouth daily.     furosemide (LASIX) 80 MG tablet Take 80 mg by mouth every Monday, Wednesday, and Friday.     hydroxychloroquine (PLAQUENIL) 200 MG tablet Take 200 mg by mouth daily.     inFLIXimab (REMICADE) 100 MG injection  Inject 100 mg into the vein every 8 (eight) weeks.  Lactobacillus (PROBIOTIC ACIDOPHILUS PO) Take by mouth daily.     levothyroxine (SYNTHROID) 125 MCG tablet Take 250 mcg by mouth daily before breakfast.     loratadine (CLARITIN) 10 MG tablet Take 10 mg by mouth daily.      methotrexate (RHEUMATREX) 2.5 MG tablet Take 10 mg by mouth every Sunday.  5   metoprolol succinate (TOPROL-XL) 100 MG 24 hr tablet Take 1 tablet (100 mg total) by mouth daily. Take with or immediately following a meal. 30 tablet 0   naloxone (NARCAN) nasal spray 4 mg/0.1 mL Place 1 spray into the nose as needed for up to 365 doses (for opioid-induced respiratory depresssion). In case of emergency (overdose), spray once into each nostril. If no response within 3 minutes, repeat application and call 911. 1 each 0   potassium chloride (KLOR-CON) 10 MEQ tablet Take 10 mEq by mouth every Monday, Wednesday, and Friday.     rosuvastatin (CRESTOR) 5 MG tablet Take 5 mg by mouth See admin instructions. On FRI and SUN     No current facility-administered medications for this visit.     Musculoskeletal: Strength & Muscle Tone:  UTA Gait & Station:  Seated Patient leans: N/A  Psychiatric Specialty Exam: Review of Systems  Musculoskeletal:  Positive for back pain.  Psychiatric/Behavioral:  Positive for dysphoric mood and sleep disturbance.     There were no vitals taken for this visit.There is no height or weight on file to calculate BMI.  General Appearance: Fairly Groomed  Eye Contact:  Fair  Speech:  Clear and Coherent  Volume:  Normal  Mood:  Depressed  Affect:  Full Range  Thought Process:  Goal Directed and Descriptions of Associations: Intact  Orientation:  Full (Time, Place, and Person)  Thought Content: Logical   Suicidal Thoughts:  No  Homicidal Thoughts:  No  Memory:  Immediate;   Fair Recent;   Fair Remote;   Fair  Judgement:  Fair  Insight:  Fair  Psychomotor Activity:  Normal  Concentration:   Concentration: Fair and Attention Span: Fair  Recall:  [employer] of Knowledge: Fair  Language: Fair  Akathisia:  No  Handed:  Right  AIMS (if indicated): not done  Assets:  Desire for Improvement Social Support Transportation  ADL's:  Intact  Cognition: WNL  Sleep:  Poor sleep problems due to pain   Screenings: AIMS    Flowsheet Row Office Visit from [facility]/28/2020 in [facility]  AIMS Total Score 0      PHQ2-9    Flowsheet Row Office Visit from 07/20/2022 in [facility] Visit from 04/04/2022 in [facility] Procedure visit from 10/21/2021 in [facility] Visit from 06/21/2021 in [facility] Visit from 03/09/2021 in [facility] Total Score 0 0 0 0 0      Flowsheet Row Video Visit from 08/01/2022 in [facility] Admission (Discharged) from 02/02/2022 in [facility] to Hosp-Admission (Discharged) from 01/31/2022 in [facility]  C-SSRS RISK CATEGORY Low Risk No Risk No Risk        Assessment and Plan: Laura [name] is a 77 year old Caucasian female, married, retired, lives in [place], has a history of depression,  multiple medical problems including chronic pain, presented today with depression symptoms, sleep problems as well as comorbid pain, physical limitations due to her current medical problems, will benefit from medication management as well as psychotherapy sessions.  Plan as noted below.  Plan MDD-unstable Increase Cymbalta to 80 mg p.o. daily.  Primary insomnia-unstable likely  due to pain. Continue doxepin 10 mg p.o. nightly. Patient to follow-up with pain provider for further management of her pain.  Will refer patient for psychotherapy-I have provided resources for therapist in the community.  Patient may also benefit from being part of a group like Bible study group, book club and activities that she can do virtually.  This was discussed with patient as well as daughter.  Collateral information obtained from daughter as noted above.  Crisis plan discussed with patient as well as daughter.  Patient to go to the nearest emergency department for possible inpatient behavioral health admission if her symptoms worsen.     Follow-up in clinic in 2 to 3 months or sooner in person.  Collaboration of Care: Collaboration of Care: Other I have referred patient for CBT.  I have also obtained collateral information from daughter as noted above.  Patient/Guardian was advised Release of Information must be obtained prior to any record release in order to collaborate their care with an outside provider. Patient/Guardian was advised if they have not already done so to contact the registration department to sign all necessary forms in order for [place] to release information regarding their care.   Consent: Patient/Guardian gives verbal consent for treatment and assignment of benefits for services provided during this visit. Patient/Guardian expressed understanding and agreed to proceed.   This note was generated in part or whole with voice recognition software. Voice recognition is usually quite accurate but there are transcription errors that can and very often do occur. I apologize for any typographical errors that were not detected and corrected.    Jomarie Longs, MD 08/02/2022, 5:22 PM

## 2022-08-25 ENCOUNTER — Ambulatory Visit: Payer: Medicare Other | Admitting: Neurosurgery

## 2022-08-25 ENCOUNTER — Encounter: Payer: Self-pay | Admitting: Neurosurgery

## 2022-08-25 ENCOUNTER — Ambulatory Visit (INDEPENDENT_AMBULATORY_CARE_PROVIDER_SITE_OTHER): Payer: Medicare Other | Admitting: Neurosurgery

## 2022-08-25 VITALS — BP 126/74 | Ht 66.0 in | Wt 240.0 lb

## 2022-08-25 DIAGNOSIS — M21371 Foot drop, right foot: Secondary | ICD-10-CM | POA: Diagnosis not present

## 2022-08-25 DIAGNOSIS — M21372 Foot drop, left foot: Secondary | ICD-10-CM

## 2022-08-25 DIAGNOSIS — M48062 Spinal stenosis, lumbar region with neurogenic claudication: Secondary | ICD-10-CM | POA: Diagnosis not present

## 2022-08-25 NOTE — Progress Notes (Signed)
Referring Physician:  Lynnea Ferrier, MD [address] [facility] [place],  [place] [address]  Primary Physician:  Lynnea Ferrier, MD  History of Present Illness: 08/25/2022 Mrs. Stefanski is significantly recovered from her pelvic fracture.  She comes today with continued difficulty with walking due to bilateral foot drop.  She is also having increasing urinary incontinence.   03/29/2022 Ms. Hewett returns to see me.  She is about 6 weeks s/p fixation for a pelvic fracture by Dr. Jena Gauss.  She is recovering from that injury, and has been doing physical therapy.  She continues to have some back symptoms though they are not life limiting.    07/29/2021 Ms. Jazzy Holderbaum is here today with a chief complaint of  low back pain as well as intermittent numbness in her legs particularly from her knees distally. She also endorses intermittent pain from her back radiating to her buttocks worse on the left than the right. Along with progressive weakness in her right foot. She has also been having urinary incontinence for the past 6-7 months.   She has been having progressive issues over the past 10 years.  Her pain is as bad as 9 out of 10 particular when she stands or walks.  She can walk with physical therapy, but is very limited due to her bilateral foot drops.   Bowel/Bladder Dysfunction: none  Conservative measures:  Physical therapy: has participated in Home Health PT with some relief Multimodal medical therapy including regular antiinflammatories: cymbalta, oxycodone Injections: has had epidural steroid injections with Dr. Laban Emperor   Past Surgery: denies  [name] has no symptoms of cervical myelopathy.  The symptoms are causing a significant impact on the patient's life.    Progress Note from Manning Charity, [place] on 06/03/21: History of Present Illness: 06/03/2021 Ms. Loreda Koralewski is a 77 y.o with a history of obesity, rheumatoid arthritis on  Plaquenil,CAD, HLD, lymphedema, hypertension, A-fib, CKD and chronic lumbosacral complaints who is here today for evaluation of her lumbar spine.  She states that she has continued to have low back pain since her visit last year as well as intermittent numbness in her legs particularly from her knees distally that seems to be worse with activity. She also endorses intermittent pain from her back radiating to her buttocks worse on the left than the right. She states she did have a fall about a year and a half ago which resulted in pain in her left lateral calf this is since resolved however she notes about 6 to 7 months of progressive weakness in her right foot which is new since her last visit. She also endorses about a year and a half intermittent urinary incontinence and wears diapers. Due to her progressive weakness in her lower extremities she has a fear of falling and is thus largely wheelchair-bound at home. Review of Systems:  A 10 point review of systems is negative, except for the pertinent positives and negatives detailed in the HPI.  Past Medical History: Past Medical History:  Diagnosis Date   Allergy    Anxiety    Arthritis, degenerative 10/08/2013   Overview:    a.  Lumbar spine/spinal stenosis/foot drop.   b.  Hands.    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  Memory loss, short term 03/19/2014   Rheumatoid arthritis (HCC)    Sciatica of left side 12/31/2019   Seizure (HCC) 10/07/2014   Sleep apnea    Sleep apnea    Thyroid disease     Past Surgical History: Past Surgical History:  Procedure Laterality Date   ABDOMINAL HYSTERECTOMY     CESAREAN SECTION     HIP SURGERY Right    ORIF PELVIC FRACTURE WITH PERCUTANEOUS SCREWS Bilateral 02/02/2022   Procedure: ORIF PELVIC FRACTURE WITH  PERCUTANEOUS SCREWS;  Surgeon: Roby Lofts, MD;  Location: [facility] OR;  Service: Orthopedics;  Laterality: Bilateral;   REPLACEMENT TOTAL KNEE Left     Allergies: Allergies as of 08/25/2022 - Review Complete 08/01/2022  Allergen Reaction Noted   Bupropion Other (See Comments) 02/01/2022   Jardiance [empagliflozin] Other (See Comments) 01/14/2021   Penicillins Nausea And Vomiting and Rash 10/07/2014    Medications: No outpatient medications have been marked as taking for the 08/25/22 encounter (Office Visit) with Venetia Night, MD.    Social History: Social History   Tobacco Use   Smoking status: Never   Smokeless tobacco: Never  Vaping Use   Vaping status: Never Used  Substance Use Topics   Alcohol use: No    Alcohol/week: 0.0 standard drinks of alcohol   Drug use: No    Family Medical History: Family History  Problem Relation Age of Onset   Hypertension Mother    Stroke Mother    Heart attack Father    Alcohol abuse Father    Depression Father    Post-traumatic stress disorder Father    Rheum arthritis Sister    Hypertension Sister    Depression Sister    Breast cancer Neg Hx     Physical Examination: There were no vitals filed for this visit.   General: Patient is well developed, well nourished, calm, collected, and in no apparent distress. Attention to examination is appropriate.  Neck:   Supple.  Full range of motion.  Respiratory: Patient is breathing without any difficulty.   NEUROLOGICAL:     Awake, alert, oriented to person, place, and time.  Speech is clear and fluent. Fund of knowledge is appropriate.   Cranial Nerves: Pupils equal round and reactive to light.  Facial tone is symmetric.  Facial sensation is symmetric. Shoulder shrug is symmetric. Tongue protrusion is midline.  There is no pronator drift.  ROM of spine: full.    Strength: Side Biceps Triceps Deltoid Interossei Grip Wrist Ext. Wrist Flex.  R 5 5 5 5 5 5 5   L 5 5 5 5 5 5 5     Side Iliopsoas Quads Hamstring PF DF EHL  R 5 5 5 3  0 0  L 5 5 5 3 1  0   Reflexes are 1+ and symmetric at the biceps, triceps, brachioradialis, patella and achilles.   Hoffman's is absent.  Clonus is not present.   Bilateral upper and lower extremity sensation is symmetric but limited in her bilateral extremities to light touch.    No evidence of dysmetria noted.  Gait is untested due to bilateral foot drop.     Medical Decision Making  Imaging: MRI L spine 04/29/21 IMPRESSION: 1. Advanced lumbar spine degeneration with L3-4 and L4-5 anterolisthesis. 2. L3-4 chronic, severe spinal stenosis. 3. L2-3 and L4-5 moderate to advanced spinal stenosis. Bilateral L5 impingement at the subarticular recesses of L4-5.     Electronically Signed   By: Tiburcio Pea M.D.   On: 04/30/2021  06:15  I have personally reviewed the images and agree with the above interpretation.  Assessment and Plan: Ms. Kroft is a pleasant 77 y.o. female with back pain, neurogenic claudication, and bilateral foot drop.  Her foot drop is longstanding.  She has tried and failed physical therapy.  I described considering an L2-5 decompression.  We have discussed this in the past, and I still feel that it is reasonable to consider this intervention.  I did describe to her that I would not expect it to improve her foot drop or her continence, but may improve her pain.  She would like to discuss it with her husband.  She is on Remicade every 8 weeks.  If she does have surgery, we would schedule it for the week of October 7.  Then she would resume Remicade in December.  I discussed the planned procedure at length with the patient, including the risks, benefits, alternatives, and indications. The risks discussed include but are not limited to bleeding, infection, need for reoperation, spinal fluid leak, stroke, vision loss, anesthetic complication, coma, paralysis, and even death. I also described in detail that  improvement was not guaranteed.  The patient expressed understanding of these risks, and asked that we proceed with surgery. I described the surgery in layman's terms, and gave ample opportunity for questions, which were answered to the best of my ability.   I spent a total of 15 minutes in face-to-face and non-face-to-face activities related to this patient's care today.  Thank you for involving me in the care of this patient.       K. Myer Haff MD, [facility] Neurosurgery

## 2022-08-26 ENCOUNTER — Other Ambulatory Visit: Payer: Self-pay | Admitting: Psychiatry

## 2022-08-26 DIAGNOSIS — F3342 Major depressive disorder, recurrent, in full remission: Secondary | ICD-10-CM

## 2022-08-26 DIAGNOSIS — F330 Major depressive disorder, recurrent, mild: Secondary | ICD-10-CM

## 2022-08-26 DIAGNOSIS — F5101 Primary insomnia: Secondary | ICD-10-CM

## 2022-09-13 ENCOUNTER — Telehealth: Payer: Self-pay | Admitting: Neurosurgery

## 2022-09-13 DIAGNOSIS — M48062 Spinal stenosis, lumbar region with neurogenic claudication: Secondary | ICD-10-CM

## 2022-09-13 NOTE — Telephone Encounter (Signed)
Patient's daughter is calling back that they would like to move forward with surgery. You mentioned October so the next available opening please. Call pt's daughter Kashanti to set everything up please.

## 2022-09-14 ENCOUNTER — Encounter: Payer: Self-pay | Admitting: Neurosurgery

## 2022-09-14 NOTE — Telephone Encounter (Addendum)
Left message for Laura [name] to return call to discuss.   Per Dr Myer Haff, Mrs Wach would have to hold her inflectra infusion 4 weeks before and 4 weeks after surgery.  Her chart has her listed as taking aspirin 325mg . She has a history of stroke. Will need to discuss that she would need to hold aspirin 325mg  for surgery (7 days prior, 14 days after), but that she could stay on aspirin 81mg .

## 2022-09-19 NOTE — Telephone Encounter (Signed)
Left message to return call 

## 2022-09-19 NOTE — Telephone Encounter (Signed)
Laura [name] returning your call [phone]

## 2022-09-20 ENCOUNTER — Other Ambulatory Visit (HOSPITAL_COMMUNITY): Payer: Self-pay

## 2022-10-03 ENCOUNTER — Other Ambulatory Visit (HOSPITAL_COMMUNITY): Payer: Self-pay | Admitting: Psychiatry

## 2022-10-03 DIAGNOSIS — F3342 Major depressive disorder, recurrent, in full remission: Secondary | ICD-10-CM

## 2022-10-03 NOTE — Telephone Encounter (Signed)
Patient requested Cymbalta 60 mg however it was given on August 13 for 90 days and it is too soon to refill.

## 2022-10-07 NOTE — Patient Instructions (Incomplete)
____________________________________________________________________________________________  Opioid Pain Medication Update  To: All patients taking opioid pain medications. (I.e.: hydrocodone, hydromorphone, oxycodone, oxymorphone, morphine, codeine, methadone, tapentadol, tramadol, buprenorphine, fentanyl, etc.)  Re: Updated review of side effects and adverse reactions of opioid analgesics, as well as new information about long term effects of this class of medications.  Direct risks of long-term opioid therapy are not limited to opioid addiction and overdose. Potential medical risks include serious fractures, breathing problems during sleep, hyperalgesia, immunosuppression, chronic constipation, bowel obstruction, myocardial infarction, and tooth decay secondary to xerostomia.  Unpredictable adverse effects that can occur even if you take your medication correctly: Cognitive impairment, respiratory depression, and death. Most people think that if they take their medication "correctly", and "as instructed", that they will be safe. Nothing could be farther from the truth. In reality, a significant amount of recorded deaths associated with the use of opioids has occurred in individuals that had taken the medication for a long time, and were taking their medication correctly. The following are examples of how this can happen: Patient taking his/her medication for a long time, as instructed, without any side effects, is given a certain antibiotic or another unrelated medication, which in turn triggers a "Drug-to-drug interaction" leading to disorientation, cognitive impairment, impaired reflexes, respiratory depression or an untoward event leading to serious bodily harm or injury, including death.  Patient taking his/her medication for a long time, as instructed, without any side effects, develops an acute impairment of liver and/or kidney function. This will lead to a rapid inability of the body to  breakdown and eliminate their pain medication, which will result in effects similar to an "overdose", but with the same medicine and dose that they had always taken. This again may lead to disorientation, cognitive impairment, impaired reflexes, respiratory depression or an untoward event leading to serious bodily harm or injury, including death.  A similar problem will occur with patients as they grow older and their liver and kidney function begins to decrease as part of the aging process.  Background information: Historically, the original case for using long-term opioid therapy to treat chronic noncancer pain was based on safety assumptions that subsequent experience has called into question. In 1996, the American Pain Society and the American Academy of Pain Medicine issued a consensus statement supporting long-term opioid therapy. This statement acknowledged the dangers of opioid prescribing but concluded that the risk for addiction was low; respiratory depression induced by opioids was short-lived, occurred mainly in opioid-naive patients, and was antagonized by pain; tolerance was not a common problem; and efforts to control diversion should not constrain opioid prescribing. This has now proven to be wrong. Experience regarding the risks for opioid addiction, misuse, and overdose in community practice has failed to support these assumptions.  According to the Centers for Disease Control and Prevention, fatal overdoses involving opioid analgesics have increased sharply over the past decade. Currently, more than 96,700 people die from drug overdoses every year. Opioids are a factor in 7 out of every 10 overdose deaths. Deaths from drug overdose have surpassed motor vehicle accidents as the leading cause of death for individuals between the ages of 80 and 61.  Clinical data suggest that neuroendocrine dysfunction may be very common in both men and women, potentially causing hypogonadism, erectile  dysfunction, infertility, decreased libido, osteoporosis, and depression. Recent studies linked higher opioid dose to increased opioid-related mortality. Controlled observational studies reported that long-term opioid therapy may be associated with increased risk for cardiovascular events. Subsequent meta-analysis concluded  that the safety of long-term opioid therapy in elderly patients has not been proven.   Side Effects and adverse reactions: Common side effects: Drowsiness (sedation). Dizziness. Nausea and vomiting. Constipation. Physical dependence -- Dependence often manifests with withdrawal symptoms when opioids are discontinued or decreased. Tolerance -- As you take repeated doses of opioids, you require increased medication to experience the same effect of pain relief. Respiratory depression -- This can occur in healthy people, especially with higher doses. However, people with COPD, asthma or other lung conditions may be even more susceptible to fatal respiratory impairment.  Uncommon side effects: An increased sensitivity to feeling pain and extreme response to pain (hyperalgesia). Chronic use of opioids can lead to this. Delayed gastric emptying (the process by which the contents of your stomach are moved into your small intestine). Muscle rigidity. Immune system and hormonal dysfunction. Quick, involuntary muscle jerks (myoclonus). Arrhythmia. Itchy skin (pruritus). Dry mouth (xerostomia).  Long-term side effects: Chronic constipation. Sleep-disordered breathing (SDB). Increased risk of bone fractures. Hypothalamic-pituitary-adrenal dysregulation. Increased risk of overdose.  RISKS: Respiratory depression and death: Opioids increase the risk of respiratory depression and death.  Drug-to-drug interactions: Opioids are relatively contraindicated in combination with benzodiazepines, sleep inducers, and other central nervous system depressants. Other classes of medications  (i.e.: certain antibiotics and even over-the-counter medications) may also trigger or induce respiratory depression in some patients.  Medical conditions: Patients with pre-existing respiratory problems are at higher risk of respiratory failure and/or depression when in combination with opioid analgesics. Opioids are relatively contraindicated in some medical conditions such as central sleep apnea.   Fractures and Falls:  Opioids increase the risk and incidence of falls. This is of particular importance in elderly patients.  Endocrine System:  Long-term administration is associated with endocrine abnormalities (endocrinopathies). (Also known as Opioid-induced Endocrinopathy) Influences on both the hypothalamic-pituitary-adrenal axis?and the hypothalamic-pituitary-gonadal axis have been demonstrated with consequent hypogonadism and adrenal insufficiency in both sexes. Hypogonadism and decreased levels of dehydroepiandrosterone sulfate have been reported in men and women. Endocrine effects include: Amenorrhoea in women (abnormal absence of menstruation) Reduced libido in both sexes Decreased sexual function Erectile dysfunction in men Hypogonadisms (decreased testicular function with shrinkage of testicles) Infertility Depression and fatigue Loss of muscle mass Anxiety Depression Immune suppression Hyperalgesia Weight gain Anemia Osteoporosis Patients (particularly women of childbearing age) should avoid opioids. There is insufficient evidence to recommend routine monitoring of asymptomatic patients taking opioids in the long-term for hormonal deficiencies.  Immune System: Human studies have demonstrated that opioids have an immunomodulating effect. These effects are mediated via opioid receptors both on immune effector cells and in the central nervous system. Opioids have been demonstrated to have adverse effects on antimicrobial response and anti-tumour surveillance. Buprenorphine has  been demonstrated to have no impact on immune function.  Opioid Induced Hyperalgesia: Human studies have demonstrated that prolonged use of opioids can lead to a state of abnormal pain sensitivity, sometimes called opioid induced hyperalgesia (OIH). Opioid induced hyperalgesia is not usually seen in the absence of tolerance to opioid analgesia. Clinically, hyperalgesia may be diagnosed if the patient on long-term opioid therapy presents with increased pain. This might be qualitatively and anatomically distinct from pain related to disease progression or to breakthrough pain resulting from development of opioid tolerance. Pain associated with hyperalgesia tends to be more diffuse than the pre-existing pain and less defined in quality. Management of opioid induced hyperalgesia requires opioid dose reduction.  Cancer: Chronic opioid therapy has been associated with an increased risk of cancer  among noncancer patients with chronic pain. This association was more evident in chronic strong opioid users. Chronic opioid consumption causes significant pathological changes in the small intestine and colon. Epidemiological studies have found that there is a link between opium dependence and initiation of gastrointestinal cancers. Cancer is the second leading cause of death after cardiovascular disease. Chronic use of opioids can cause multiple conditions such as GERD, immunosuppression and renal damage as well as carcinogenic effects, which are associated with the incidence of cancers.   Mortality: Long-term opioid use has been associated with increased mortality among patients with chronic non-cancer pain (CNCP).  Prescription of long-acting opioids for chronic noncancer pain was associated with a significantly increased risk of all-cause mortality, including deaths from causes other than overdose.  Reference: Von Korff M, Kolodny A, Deyo RA, Chou R. Long-term opioid therapy reconsidered. Ann Intern Med. 2011  Sep 6;155(5):325-8. doi: 10.7326/[id number]. PMID: 64403474; PMCID: [id number]. Randon Goldsmith, Hayward RA, Dunn KM, [place] KP. Risk of adverse events in patients prescribed long-term opioids: A cohort study in the [place] [facility] Research Datalink. Eur J Pain. 2019 May;23(5):908-922. doi: 10.1002/ejp.1357. Epub 2019 Jan 31. PMID: 43329518. Colameco S, Coren JS, Ciervo CA. Continuous opioid treatment for chronic noncancer pain: a time for moderation in prescribing. Postgrad Med. 2009 Jul;121(4):61-6. doi: 10.3810/pgm.2009.07.2032. PMID: 84166063. William Hamburger RN, [place] SD, Blazina I, Cristopher Peru, Bougatsos C, Deyo RA. The effectiveness and risks of long-term opioid therapy for chronic pain: a systematic review for a [employer] of [employer] Pathways to [employer]. Ann Intern Med. 2015 Feb 17;162(4):276-86. doi: 10.7326/M14-2559. PMID: 01601093. Caryl Bis [facility], Makuc DM. NCHS Data Brief No. 22. [place]: Centers for Disease Control and Prevention; 2009. Sep, Increase in Fatal Poisonings Involving Opioid Analgesics in the [place], 1999-2006. Song IA, Choi HR, Oh TK. Long-term opioid use and mortality in patients with chronic non-cancer pain: Ten-year follow-up study in [place] from 2010 through 2019. EClinicalMedicine. 2022 Jul 18;51:101558. doi: 10.1016/j.eclinm.2022.235573. PMID: 22025427; PMCID: [id number]. Huser, W., Schubert, T., Vogelmann, T. et al. All-cause mortality in patients with long-term opioid therapy compared with non-opioid analgesics for chronic non-cancer pain: a database study. BMC Med 18, 162 (2020). http://lester.info/ Rashidian H, Karie Kirks, Malekzadeh R, Haghdoost AA. An Ecological Study of the Association between Opiate Use and Incidence of Cancers. Addict Health. 2016 Fall;8(4):252-260. PMID: 51761607; PMCID: [id number].  Our Goal: Our goal is to control your  pain with means other than the use of opioid pain medications.  Our Recommendation: Talk to your physician about coming off of these medications. We can assist you with the tapering down and stopping these medicines. Based on the new information, even if you cannot completely stop the medication, a decrease in the dose may be associated with a lesser risk. Ask for other means of controlling the pain. Decrease or eliminate those factors that significantly contribute to your pain such as smoking, obesity, and a diet heavily tilted towards "inflammatory" nutrients.  Last Updated: 07/18/2022   ____________________________________________________________________________________________     ____________________________________________________________________________________________  National Pain Medication Shortage  The [place] is experiencing worsening drug shortages. These have had a negative widespread effect on patient care and treatment. Not expected to improve any time soon. Predicted to last past 2029.   Drug shortage list (generic names) Oxycodone IR Oxycodone/APAP Oxymorphone IR Hydromorphone Hydrocodone/APAP Morphine  Where is the problem?  Manufacturing and supply level.  Will this shortage affect you?  Only if you  take any of the above pain medications.  How? You may be unable to fill your prescription.  Your pharmacist may offer a "partial fill" of your prescription. (Warning: Do not accept partial fills.) Prescriptions partially filled cannot be transferred to another pharmacy. Read our Medication Rules and Regulation. Depending on how much medicine you are dependent on, you may experience withdrawals when unable to get the medication.  Recommendations: Consider ending your dependence on opioid pain medications. Ask your pain specialist to assist you with the process. Consider switching to a medication currently not in shortage, such as Buprenorphine. Talk to your pain  specialist about this option. Consider decreasing your pain medication requirements by managing tolerance thru "Drug Holidays". This may help minimize withdrawals, should you run out of medicine. Control your pain thru the use of non-pharmacological interventional therapies.   Your prescriber: Prescribers cannot be blamed for shortages. Medication manufacturing and supply issues cannot be fixed by the prescriber.   NOTE: The prescriber is not responsible for supplying the medication, or solving supply issues. Work with your pharmacist to solve it. The patient is responsible for the decision to take or continue taking the medication and for identifying and securing a legal supply source. By law, supplying the medication is the job and responsibility of the pharmacy. The prescriber is responsible for the evaluation, monitoring, and prescribing of these medications.   Prescribers will NOT: Re-issue prescriptions that have been partially filled. Re-issue prescriptions already sent to a pharmacy.  Re-send prescriptions to a different pharmacy because yours did not have your medication. Ask pharmacist to order more medicine or transfer the prescription to another pharmacy. (Read below.)  New 2023 regulation: "September 10, 2021 Revised Regulation Allows DEA-Registered Pharmacies to Transfer Electronic Prescriptions at a Patient's Request DEA Headquarters Division - Public Information Office Patients now have the ability to request their electronic prescription be transferred to another pharmacy without having to go back to their practitioner to initiate the request. This revised regulation went into effect on Monday, September 06, 2021.     At a patient's request, a DEA-registered retail pharmacy can now transfer an electronic prescription for a controlled substance (schedules II-V) to another DEA-registered retail pharmacy. Prior to this change, patients would have to go through their practitioner to  cancel their prescription and have it re-issued to a different pharmacy. The process was taxing and time consuming for both patients and practitioners.    The Drug Enforcement Administration [facility]) published its intent to revise the process for transferring electronic prescriptions on November 29, 2019.  The final rule was published in the federal register on August 05, 2021 and went into effect 30 days later.  Under the final rule, a prescription can only be transferred once between pharmacies, and only if allowed under existing state or other applicable law. The prescription must remain in its electronic form; may not be altered in any way; and the transfer must be communicated directly between two licensed pharmacists. It's important to note, any authorized refills transfer with the original prescription, which means the entire prescription will be filled at the same pharmacy".  Reference: HugeHand.is [facility] website announcement)  [email or web address] [employer] of [employer])   [employer] / Vol. 88, No. 143 / Thursday, August 05, 2021 / Rules and Regulations DEPARTMENT OF JUSTICE  Drug Enforcement Administration  21 CFR Part 1306  [Docket No. [id number]]  RIN [id number] Transfer of Electronic Prescriptions for Schedules II-V Controlled Substances Between Pharmacies for Initial Filling  ____________________________________________________________________________________________  ____________________________________________________________________________________________  Transfer of Pain Medication between Pharmacies  Re: 2023 DEA Clarification on existing regulation  Published on DEA Website: September 10, 2021  Title: Revised Regulation Allows DEA-Registered Pharmacies to Electrical engineer Prescriptions at a Patient's  Request DEA Headquarters Division - [employer]  "Patients now have the ability to request their electronic prescription be transferred to another pharmacy without having to go back to their practitioner to initiate the request. This revised regulation went into effect on Monday, September 06, 2021.     At a patient's request, a DEA-registered retail pharmacy can now transfer an electronic prescription for a controlled substance (schedules II-V) to another DEA-registered retail pharmacy. Prior to this change, patients would have to go through their practitioner to cancel their prescription and have it re-issued to a different pharmacy. The process was taxing and time consuming for both patients and practitioners.    The Drug Enforcement Administration [facility]) published its intent to revise the process for transferring electronic prescriptions on November 29, 2019.  The final rule was published in the federal register on August 05, 2021 and went into effect 30 days later.  Under the final rule, a prescription can only be transferred once between pharmacies, and only if allowed under existing state or other applicable law. The prescription must remain in its electronic form; may not be altered in any way; and the transfer must be communicated directly between two licensed pharmacists. It's important to note, any authorized refills transfer with the original prescription, which means the entire prescription will be filled at the same pharmacy."    REFERENCES: 1. DEA website announcement [email or web address]  2. Department of Justice website  [email or web address].pdf  3. DEPARTMENT OF JUSTICE Drug Enforcement Administration 21 CFR Part 1306 [Docket No. [id number]] RIN [id number] "Transfer of Electronic Prescriptions for Schedules II-V Controlled Substances  Between Pharmacies for Initial Filling"  ____________________________________________________________________________________________     _______________________________________________________________________  Medication Rules  Purpose: To inform patients, and their family members, of our medication rules and regulations.  Applies to: All patients receiving prescriptions from our practice (written or electronic).  Pharmacy of record: This is the pharmacy where your electronic prescriptions will be sent. Make sure we have the correct one.  Electronic prescriptions: In compliance with the [facility] Strengthen Opioid Misuse Prevention (STOP) Act of 2017 (Session Conni Elliot [phone]), effective January 10, 2018, all controlled substances must be electronically prescribed. Written prescriptions, faxing, or calling prescriptions to a pharmacy will no longer be done.  Prescription refills: These will be provided only during in-person appointments. No medications will be renewed without a "face-to-face" evaluation with your provider. Applies to all prescriptions.  NOTE: The following applies primarily to controlled substances (Opioid* Pain Medications).   Type of encounter (visit): For patients receiving controlled substances, face-to-face visits are required. (Not an option and not up to the patient.)  Patient's responsibilities: Pain Pills: Bring all pain pills to every appointment (except for procedure appointments). Pill Bottles: Bring pills in original pharmacy bottle. Bring bottle, even if empty. Always bring the bottle of the most recent fill.  Medication refills: You are responsible for knowing and keeping track of what medications you are taking and when is it that you will need a refill. The day before your appointment: write a list of all prescriptions that need to be refilled. The day of the appointment: give the list to the admitting nurse. Prescriptions will be written only  during appointments. No prescriptions will be written on procedure days. If you forget a  medication: it will not be "Called in", "Faxed", or "electronically sent". You will need to get another appointment to get these prescribed. No early refills. Do not call asking to have your prescription filled early. Partial  or short prescriptions: Occasionally your pharmacy may not have enough pills to fill your prescription.  NEVER ACCEPT a partial fill or a prescription that is short of the total amount of pills that you were prescribed.  With controlled substances the law allows 72 hours for the pharmacy to complete the prescription.  If the prescription is not completed within 72 hours, the pharmacist will require a new prescription to be written. This means that you will be short on your medicine and we WILL NOT send another prescription to complete your original prescription.  Instead, request the pharmacy to send a carrier to a nearby branch to get enough medication to provide you with your full prescription. Prescription Accuracy: You are responsible for carefully inspecting your prescriptions before leaving our office. Have the discharge nurse carefully go over each prescription with you, before taking them home. Make sure that your name is accurately spelled, that your address is correct. Check the name and dose of your medication to make sure it is accurate. Check the number of pills, and the written instructions to make sure they are clear and accurate. Make sure that you are given enough medication to last until your next medication refill appointment. Taking Medication: Take medication as prescribed. When it comes to controlled substances, taking less pills or less frequently than prescribed is permitted and encouraged. Never take more pills than instructed. Never take the medication more frequently than prescribed.  Inform other Doctors: Always inform, all of your healthcare providers, of all the  medications you take. Pain Medication from other Providers: You are not allowed to accept any additional pain medication from any other Doctor or Healthcare provider. There are two exceptions to this rule. (see below) In the event that you require additional pain medication, you are responsible for notifying us, as stated below. Cough Medicine: Often these contain an opioid, such as codeine or hydrocodone. Never accept or take cough medicine containing these opioids if you are already taking an opioid* medication. The combination may cause respiratory failure and death. Medication Agreement: You are responsible for carefully reading and following our Medication Agreement. This must be signed before receiving any prescriptions from our practice. Safely store a copy of your signed Agreement. Violations to the Agreement will result in no further prescriptions. (Additional copies of our Medication Agreement are available upon request.) Laws, Rules, & Regulations: All patients are expected to follow all [address] and [employer], Rules, [employer]. Ignorance of the Laws does not constitute a valid excuse.  Illegal drugs and Controlled Substances: The use of illegal substances (including, but not limited to marijuana and its derivatives) and/or the illegal use of any controlled substances is strictly prohibited. Violation of this rule may result in the immediate and permanent discontinuation of any and all prescriptions being written by our practice. The use of any illegal substances is prohibited. Adopted CDC guidelines & recommendations: Target dosing levels will be at or below 60 MME/day. Use of benzodiazepines** is not recommended.  Exceptions: There are only two exceptions to the rule of not receiving pain medications from other Healthcare Providers. Exception #1 (Emergencies): In the event of an emergency (i.e.: accident requiring emergency care), you are allowed to receive additional pain  medication. However, you are responsible for: As soon as  you are able, call our office [phone], at any time of the day or night, and leave a message stating your name, the date and nature of the emergency, and the name and dose of the medication prescribed. In the event that your call is answered by a member of our staff, make sure to document and save the date, time, and the name of the person that took your information.  Exception #2 (Planned Surgery): In the event that you are scheduled by another doctor or dentist to have any type of surgery or procedure, you are allowed (for a period no longer than 30 days), to receive additional pain medication, for the acute post-op pain. However, in this case, you are responsible for picking up a copy of our "Post-op Pain Management for Surgeons" handout, and giving it to your surgeon or dentist. This document is available at our office, and does not require an appointment to obtain it. Simply go to our office during business hours (Monday-Thursday from 8:00 AM to 4:00 PM) (Friday 8:00 AM to 12:00 Noon) or if you have a scheduled appointment with [place], prior to your surgery, and ask for it by name. In addition, you are responsible for: calling our office [phone], at any time of the day or night, and leaving a message stating your name, name of your surgeon, type of surgery, and date of procedure or surgery. Failure to comply with your responsibilities may result in termination of therapy involving the controlled substances. Medication Agreement Violation. Following the above rules, including your responsibilities will help you in avoiding a Medication Agreement Violation ("Breaking your Pain Medication Contract").  Consequences:  Not following the above rules may result in permanent discontinuation of medication prescription therapy.  *Opioid medications include: morphine, codeine, oxycodone, oxymorphone, hydrocodone, hydromorphone, meperidine, tramadol,  tapentadol, buprenorphine, fentanyl, methadone. **Benzodiazepine medications include: diazepam (Valium), alprazolam (Xanax), clonazepam (Klonopine), lorazepam (Ativan), clorazepate (Tranxene), chlordiazepoxide (Librium), estazolam (Prosom), oxazepam (Serax), temazepam (Restoril), triazolam (Halcion) (Last updated: 11/02/2021) ______________________________________________________________________    ______________________________________________________________________  Medication Recommendations and Reminders  Applies to: All patients receiving prescriptions (written and/or electronic).  Medication Rules & Regulations: You are responsible for reading, knowing, and following our "Medication Rules" document. These exist for your safety and that of others. They are not flexible and neither are we. Dismissing or ignoring them is an act of "non-compliance" that may result in complete and irreversible termination of such medication therapy. For safety reasons, "non-compliance" will not be tolerated. As with the [place] fundamental legal principle of "ignorance of the law is no defense", we will accept no excuses for not having read and knowing the content of documents provided to you by our practice.  Pharmacy of record:  Definition: This is the pharmacy where your electronic prescriptions will be sent.  We do not endorse any particular pharmacy. It is up to you and your insurance to decide what pharmacy to use.  We do not restrict you in your choice of pharmacy. However, once we write for your prescriptions, we will NOT be re-sending more prescriptions to fix restricted supply problems created by your pharmacy, or your insurance.  The pharmacy listed in the electronic medical record should be the one where you want electronic prescriptions to be sent. If you choose to change pharmacy, simply notify our nursing staff. Changes will be made only during your regular appointments and not over the  phone.  Recommendations: Keep all of your pain medications in a safe place, under lock and key, even  if you live alone. We will NOT replace lost, stolen, or damaged medication. We do not accept "Police Reports" as proof of medications having been stolen. After you fill your prescription, take 1 week's worth of pills and put them away in a safe place. You should keep a separate, properly labeled bottle for this purpose. The remainder should be kept in the original bottle. Use this as your primary supply, until it runs out. Once it's gone, then you know that you have 1 week's worth of medicine, and it is time to come in for a prescription refill. If you do this correctly, it is unlikely that you will ever run out of medicine. To make sure that the above recommendation works, it is very important that you make sure your medication refill appointments are scheduled at least 1 week before you run out of medicine. To do this in an effective manner, make sure that you do not leave the office without scheduling your next medication management appointment. Always ask the nursing staff to show you in your prescription , when your medication will be running out. Then arrange for the receptionist to get you a return appointment, at least 7 days before you run out of medicine. Do not wait until you have 1 or 2 pills left, to come in. This is very poor planning and does not take into consideration that we may need to cancel appointments due to bad weather, sickness, or emergencies affecting our staff. DO NOT ACCEPT A "Partial Fill": If for any reason your pharmacy does not have enough pills/tablets to completely fill or refill your prescription, do not allow for a "partial fill". The law allows the pharmacy to complete that prescription within 72 hours, without requiring a new prescription. If they do not fill the rest of your prescription within those 72 hours, you will need a separate prescription to fill the remaining  amount, which we will NOT provide. If the reason for the partial fill is your insurance, you will need to talk to the pharmacist about payment alternatives for the remaining tablets, but again, DO NOT ACCEPT A PARTIAL FILL, unless you can trust your pharmacist to obtain the remainder of the pills within 72 hours.  Prescription refills and/or changes in medication(s):  Prescription refills, and/or changes in dose or medication, will be conducted only during scheduled medication management appointments. (Applies to both, written and electronic prescriptions.) No refills on procedure days. No medication will be changed or started on procedure days. No changes, adjustments, and/or refills will be conducted on a procedure day. Doing so will interfere with the diagnostic portion of the procedure. No phone refills. No medications will be "called into the pharmacy". No Fax refills. No weekend refills. No Holliday refills. No after hours refills.  Remember:  Business hours are:  Monday to Thursday 8:00 AM to 4:00 PM Provider's Schedule: Delano Metz, MD - Appointments are:  Medication management: Monday and Wednesday 8:00 AM to 4:00 PM Procedure day: Tuesday and Thursday 7:30 AM to 4:00 PM Edward Jolly, MD - Appointments are:  Medication management: Tuesday and Thursday 8:00 AM to 4:00 PM Procedure day: Monday and Wednesday 7:30 AM to 4:00 PM (Last update: 11/02/2021) ______________________________________________________________________   ____________________________________________________________________________________________  Naloxone Nasal Spray  Why am I receiving this medication? [place] STOP ACT requires that all patients taking high dose opioids or at risk of opioids respiratory depression, be prescribed an opioid reversal agent, such as Naloxone (AKA: Narcan).  What is this medication? NALOXONE (  nal OX one) treats opioid overdose, which causes slow or shallow breathing,  severe drowsiness, or trouble staying awake. Call emergency services after using this medication. You may need additional treatment. Naloxone works by reversing the effects of opioids. It belongs to a group of medications called opioid blockers.  COMMON BRAND NAME(S): Kloxxado, Narcan  What should I tell my care team before I take this medication? They need to know if you have any of these conditions: Heart disease Substance use disorder An unusual or allergic reaction to naloxone, other medications, foods, dyes, or preservatives Pregnant or trying to get pregnant Breast-feeding  When to use this medication? This medication is to be used for the treatment of respiratory depression (less than 8 breaths per minute) secondary to opioid overdose.   How to use this medication? This medication is for use in the nose. Lay the person on their back. Support their neck with your hand and allow the head to tilt back before giving the medication. The nasal spray should be given into 1 nostril. After giving the medication, move the person onto their side. Do not remove or test the nasal spray until ready to use. Get emergency medical help right away after giving the first dose of this medication, even if the person wakes up. You should be familiar with how to recognize the signs and symptoms of a narcotic overdose. If more doses are needed, give the additional dose in the other nostril. Talk to your care team about the use of this medication in children. While this medication may be prescribed for children as young as newborns for selected conditions, precautions do apply.  Naloxone Overdosage: If you think you have taken too much of this medicine contact a poison control center or emergency room at once.  NOTE: This medicine is only for you. Do not share this medicine with others.  What if I miss a dose? This does not apply.  What may interact with this medication? This is only used during an  emergency. No interactions are expected during emergency use. This list may not describe all possible interactions. Give your health care provider a list of all the medicines, herbs, non-prescription drugs, or dietary supplements you use. Also tell them if you smoke, drink alcohol, or use illegal drugs. Some items may interact with your medicine.  What should I watch for while using this medication? Keep this medication ready for use in the case of an opioid overdose. Make sure that you have the phone number of your care team and local hospital ready. You may need to have additional doses of this medication. Each nasal spray contains a single dose. Some emergencies may require additional doses. After use, bring the treated person to the nearest hospital or call 911. Make sure the treating care team knows that the person has received a dose of this medication. You will receive additional instructions on what to do during and after use of this medication before an emergency occurs.  What side effects may I notice from receiving this medication? Side effects that you should report to your care team as soon as possible: Allergic reactions--skin rash, itching, hives, swelling of the face, lips, tongue, or throat Side effects that usually do not require medical attention (report these to your care team if they continue or are bothersome): Constipation Dryness or irritation inside the nose Headache Increase in blood pressure Muscle spasms Stuffy nose Toothache This list may not describe all possible side effects. Call your doctor for  medical advice about side effects. You may report side effects to FDA at 1-800-FDA-1088.  Where should I keep my medication? Because this is an emergency medication, you should keep it with you at all times.  Keep out of the reach of children and pets. Store between 20 and 25 degrees C (68 and 77 degrees F). Do not freeze. Throw away any unused medication after the  expiration date. Keep in original box until ready to use.  NOTE: This sheet is a summary. It may not cover all possible information. If you have questions about this medicine, talk to your doctor, pharmacist, or health care provider.   2023 [employer]vier/Gold Standard (2020-09-04 00:00:00)  ____________________________________________________________________________________________

## 2022-10-07 NOTE — Progress Notes (Unsigned)
PROVIDER NOTE: Information contained herein reflects review and annotations entered in association with encounter. Interpretation of such information and data should be left to medically-trained personnel. Information provided to patient can be located elsewhere in the medical record under "Patient Instructions". Document created using STT-dictation technology, any transcriptional errors that may result from process are unintentional.    Patient: Laura [name]  Service Category: E/M  Provider: Oswaldo Done, MD  DOB: February 05, 1945  DOS: 10/12/2022  Referring Provider: Lynnea Ferrier, MD  MRN: [id number]  Specialty: Interventional Pain Management  PCP: Lynnea Ferrier, MD  Type: Established Patient  Setting: Ambulatory outpatient    Location: Office  Delivery: Face-to-face     HPI  [name], a 77 y.o. year old female, is here today because of her No primary diagnosis found.. [name] primary complain today is No chief complaint on file.  Pertinent problems: 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); Lumbar foraminal stenosis (Bilateral L3-4 and L5-S1); Chronic hip pain (Left); Osteoarthritis of knee (Right); Arthritis; Fibromyalgia syndrome; Chronic arthralgias of knees and hips (Right); Chronic hand pain (Left); Dropfoot (Left); Weakness of foot (Left); Sciatica (Left); Weakness of leg (Left); Chronic lower extremity pain (2ry area of Pain) (Bilateral); Lumbosacral radiculopathy at L5 (Left); Abnormal MRI, lumbar spine  (04/30/2021); DDD (degenerative disc disease), lumbosacral; Statin myopathy; Foot drop (Left); Ligamentum flavum hypertrophy (L1-2, L2-3, L3-4); Lumbar lateral recess stenosis (Bilateral) (L2-3, L3-4, L4-5) (Severe: L3-4); Lumbar Grade 1 Anterolisthesis of L3/L4 and L4/L5; Intractable back pain; Generalized osteoarthritis of multiple sites; and Chronic low back pain (1ry area of Pain) (Bilateral) (L>R) w/o sciatica on their pertinent problem list. Pain Assessment: Severity of   is reported as a  /10. Location:    / . Onset:  . Quality:  . Timing:  . Modifying factor(s):  Marland Kitchen Vitals:  vitals were not taken for this visit.  BMI: Estimated body mass index is 38.74 kg/m as calculated from the following:   Height as of 08/25/22: 5\' 6"  (1.676 m).   Weight as of 08/25/22: 240 lb (108.9 kg). Last encounter: 07/20/2022. Last procedure: 10/21/2021.  Reason for encounter: medication management. ***  Routine UDS ordered today.   RTCB: 01/16/2023   Pharmacotherapy Assessment  Analgesic: Oxycodone IR 5 mg every 6 hours (20 mg/day) MME/day: 30 mg/day.   Monitoring: [place] PMP: PDMP reviewed during this encounter.       Pharmacotherapy: No side-effects or adverse reactions reported. Compliance: No problems identified. Effectiveness: Clinically acceptable.  No notes on file  No results found for: "CBDTHCR" No results found for: "D8THCCBX" No results found for: "D9THCCBX"  UDS:  Summary  Date Value Ref Range Status  06/22/2021 Note  Final    Comment:    ==================================================================== [facility] (MW) ==================================================================== Test                             Result       Flag       Units  Drug Present and Declared for Prescription Verification   Oxycodone  1634         EXPECTED   ng/mg creat   Oxymorphone                    770          EXPECTED   ng/mg creat   Noroxycodone                   2933          EXPECTED   ng/mg creat   Noroxymorphone                 191          EXPECTED   ng/mg creat    Sources of oxycodone are scheduled prescription medications.    Oxymorphone, noroxycodone, and noroxymorphone are expected    metabolites of oxycodone. Oxymorphone is also available as a    scheduled prescription medication.  ==================================================================== Test                      Result    Flag   Units      Ref Range   Creatinine              94               mg/dL      >=19 ==================================================================== Declared Medications:  The flagging and interpretation on this report are based on the  following declared medications.  Unexpected results may arise from  inaccuracies in the declared medications.   **Note: The testing scope of this panel includes these medications:   Oxycodone (OxyIR)   **Note: The testing scope of this panel does not include the  following reported medications:   Albuterol (Ventolin HFA)  Aspirin  Azelastine (Astelin)  Duloxetine (Cymbalta)  Folic Acid  Furosemide (Lasix)  Hydroxychloroquine (Plaquenil)  Hydroxyzine (Vistaril)  Infliximab (Remicade)  Levothyroxine (Synthroid)  Loratadine (Claritin)  Methotrexate  Metoprolol (Toprol)  Multivitamin  Ondansetron (Zofran)  Rosuvastatin (Crestor)  Spironolactone (Aldactone)  Trazodone (Desyrel)  Vitamin D ==================================================================== For clinical consultation, please call [phone]. ====================================================================       ROS  Constitutional: Denies any fever or chills Gastrointestinal: No reported hemesis, hematochezia, vomiting, or acute GI distress Musculoskeletal: Denies any acute onset joint swelling, redness, loss of ROM, or weakness Neurological: No reported episodes of acute onset apraxia, aphasia, dysarthria, agnosia, amnesia,  paralysis, loss of coordination, or loss of consciousness  Medication Review  Calcium-Magnesium-Vitamin D, Cholecalciferol, DULoxetine, albuterol, aspirin EC, calcium carbonate, docusate sodium, doxepin, esomeprazole, folic acid, furosemide, hydroxychloroquine, inFLIXimab, levothyroxine, loratadine, methotrexate, metoprolol succinate, naloxone, oxyCODONE, potassium chloride, and rosuvastatin  History Review  Allergy: Ms. Reyes is allergic to bupropion, jardiance [empagliflozin], and penicillins. Drug: Ms. Fejes  reports no history of drug use. Alcohol:  reports no history of alcohol use. Tobacco:  reports that she has never smoked. She has never used smokeless tobacco. Social: Ms. Houlahan  reports that she has never smoked. She has never used smokeless tobacco. She reports that she does not drink alcohol and does not use drugs. Medical:  has a past medical history of Allergy, Anxiety, Arthritis, degenerative (10/08/2013), Asthma, Chronic hand pain, left (10/10/2019), Chronic kidney disease, Depression, Lumbar spinal stenosis with neurogenic claudication (11/07/2014), Major depression, single episode, in complete remission (HCC) (06/25/2015), Memory loss, short term (03/19/2014), Rheumatoid arthritis (HCC), Sciatica of left side (12/31/2019), Seizure (HCC) (10/07/2014), Sleep apnea, and Thyroid disease. Surgical: Ms. Goel  has a past surgical history  that includes Abdominal hysterectomy; Cesarean section; Replacement total knee (Left); Hip surgery (Right); and Orif pelvic fracture with percutaneous screws (Bilateral, 02/02/2022). Family: family history includes Alcohol abuse in her father; Depression in her father and sister; Heart attack in her father; Hypertension in her mother and sister; Post-traumatic stress disorder in her father; Rheum arthritis in her sister; Stroke in her mother.  Laboratory Chemistry Profile   Renal Lab Results  Component Value Date   BUN 31 (H) 02/04/2022    CREATININE 1.10 (H) 02/04/2022   GFRAA 57 (L) 10/03/2019   GFRNONAA 52 (L) 02/04/2022    Hepatic Lab Results  Component Value Date   AST 13 (L) 02/04/2022   ALT 6 02/04/2022   ALBUMIN 2.7 (L) 02/04/2022   ALKPHOS 78 02/04/2022   LIPASE 22 01/14/2021    Electrolytes Lab Results  Component Value Date   NA 136 02/04/2022   K 4.0 02/04/2022   CL 96 (L) 02/04/2022   CALCIUM 8.3 (L) 02/04/2022   MG 2.3 10/08/2018    Bone Lab Results  Component Value Date   VD25OH See Scanned report in [facility] 02/03/2022   25OHVITD1 36 07/15/2015   25OHVITD2 3.6 07/15/2015   25OHVITD3 32 07/15/2015    Inflammation (CRP: Acute Phase) (ESR: Chronic Phase) Lab Results  Component Value Date   CRP 1.9 (H) 07/15/2015   ESRSEDRATE 32 (H) 07/15/2015   LATICACIDVEN 1.4 01/31/2022         Note: Above Lab results reviewed.  Recent Imaging Review  DG Pelvis Comp Min 3V CLINICAL DATA:  [id number] with sacral fracture noted on CT.  EXAM: JUDET PELVIS - 3+ VIEW  COMPARISON:  Intraoperative spot fluoroscopic radiographs during iliosacral screw fixation earlier today.  FINDINGS: Judet pelvic views again demonstrate a single iliosacral screw fixation. Several incomplete clefts in the screw are again shown. Positioning and alignment are unchanged.  There is osteopenia, old right hip nailing and advanced bilateral degenerative hip arthrosis with enthesopathic change of the pelvis.  IMPRESSION: No interval change in the appearance of the iliosacral screw fixation. Several incomplete clefts in the screw are again shown.  Electronically Signed   By: Almira Bar M.D.   On: 02/03/2022 05:29 Note: Reviewed        Physical Exam  General appearance: Well nourished, well developed, and well hydrated. In no apparent acute distress Mental status: Alert, oriented x 3 (person, place, & time)       Respiratory: No evidence of acute respiratory distress Eyes: PERLA Vitals: There were no vitals  taken for this visit. BMI: Estimated body mass index is 38.74 kg/m as calculated from the following:   Height as of 08/25/22: 5\' 6"  (1.676 m).   Weight as of 08/25/22: 240 lb (108.9 kg). Ideal: Patient weight not recorded  Assessment   Diagnosis Status  1. Chronic pain syndrome   2. Pharmacologic therapy   3. Encounter for medication management   4. Chronic use of opiate for therapeutic purpose   5. Generalized osteoarthritis of multiple sites   6. DDD (degenerative disc disease), lumbosacral   7. Seronegative rheumatoid arthritis (HCC)   8. Lumbar spinal stenosis (5 mm Severe L3-4; 8 mm L4-5) w/ neurogenic claudication   9. Lumbar facet syndrome (Bilateral) (L>R)   10. Lumbar foraminal stenosis (Bilateral L3-4 and L5-S1)   11. Chronic hip pain (Left)   12. Chronic hand pain (Left)   13. Ligamentum flavum hypertrophy (L1-2, L2-3, L3-4)   14. Encounter for chronic pain management  15. Chronic knee pain (Right)   16. Lumbar lateral recess stenosis (Bilateral) (L2-3, L3-4, L4-5) (Severe: L3-4)   17. Chronic lower extremity pain (2ry area of Pain) (Bilateral)   18. Chronic low back pain (1ry area of Pain) (Bilateral) (L>R) w/ sciatica (Bilateral)   19. Lumbar Grade 1 Anterolisthesis of L3/L4 and L4/L5    Controlled Controlled Controlled   Updated Problems: No problems updated.  Plan of Care  Problem-specific:  No problem-specific Assessment & Plan notes found for this encounter.  Ms. Amaliya Hodge has a current medication list which includes the following long-term medication(s): albuterol, calcium-magnesium-vitamin d, doxepin, duloxetine, duloxetine, infliximab, loratadine, metoprolol succinate, and oxycodone.  Pharmacotherapy (Medications Ordered): No orders of the defined types were placed in this encounter.  Orders:  No orders of the defined types were placed in this encounter.  Follow-up plan:   No follow-ups on file.      Interventional Therapies  Risk   Complexity Considerations:   Estimated body mass index is 38.27 kg/m as calculated from the following:   Height as of 12/22/20: 5\' 5"  (1.651 m).   Weight as of 12/22/20: 230 lb (104.3 kg). PLAQUENIL Anticoagulation (Stop: 11 days)   Planned  Pending:   Therapeutic midline L2-3 LESI #4    Under consideration:   Diagnostic bilateral genicular NB  Diagnostic bilateral L3 and/or L5 TFESI   Completed:   Therapeutic right IA steroid knee injection x2 (10/15/2015)  Therapeutic right Hyalgan knee injection x1 (09/01/2016)  Therapeutic left lumbar facet block x1 (01/22/2015)  Therapeutic left IA hip injection x1 (03/17/2015)  Therapeutic left L4-5 LESI x1 (02/04/2020) (100/100/85/85)  Therapeutic midline L2-3 LESI x3 (12/22/2020) (95/95/95/95)    Therapeutic  Palliative (PRN) options:   Therapeutic right IA Hyalgan knee injections Palliative left IA hip joint injection  Diagnostic left lumbar facet MBB #2         Recent Visits Date Type Provider Dept  07/20/22 Office Visit Delano Metz, MD [facility]  07/11/22 Office Visit Delano Metz, MD [facility]  Showing recent visits within past 90 days and meeting all other requirements Future Appointments Date Type Provider Dept  10/12/22 Appointment Delano Metz, MD [facility]  Showing future appointments within next 90 days and meeting all other requirements  I discussed the assessment and treatment plan with the patient. The patient was provided an opportunity to ask questions and all were answered. The patient agreed with the plan and demonstrated an understanding of the instructions.  Patient advised to call back or seek an in-person evaluation if the symptoms or condition worsens.  Duration of encounter: *** minutes.  Total time on encounter, as per AMA guidelines included both the face-to-face and non-face-to-face time personally spent by the physician and/or other qualified health care  professional(s) on the day of the encounter (includes time in activities that require the physician or other qualified health care professional and does not include time in activities normally performed by clinical staff). Physician's time may include the following activities when performed: Preparing to see the patient (e.g., pre-charting review of records, searching for previously ordered imaging, lab work, and nerve conduction tests) Review of prior analgesic pharmacotherapies. Reviewing PMP Interpreting ordered tests (e.g., lab work, imaging, nerve conduction tests) Performing post-procedure evaluations, including interpretation of diagnostic procedures Obtaining and/or reviewing separately obtained history Performing a medically appropriate examination and/or evaluation Counseling and educating the patient/family/caregiver Ordering medications, tests, or procedures Referring and communicating with other health care professionals (when not separately  reported) Documenting clinical information in the electronic or other health record Independently interpreting results (not separately reported) and communicating results to the patient/ family/caregiver Care coordination (not separately reported)  Note by: Oswaldo Done, MD Date: 10/12/2022; Time: 5:04 PM

## 2022-10-11 DIAGNOSIS — R159 Full incontinence of feces: Secondary | ICD-10-CM

## 2022-10-11 HISTORY — DX: Full incontinence of feces: R15.9

## 2022-10-12 ENCOUNTER — Ambulatory Visit: Payer: Medicare Other | Attending: Pain Medicine | Admitting: Pain Medicine

## 2022-10-12 ENCOUNTER — Encounter: Payer: Self-pay | Admitting: Pain Medicine

## 2022-10-12 VITALS — BP 145/59 | HR 80 | Temp 97.3°F | Ht 66.0 in | Wt 240.0 lb

## 2022-10-12 DIAGNOSIS — M25561 Pain in right knee: Secondary | ICD-10-CM | POA: Diagnosis present

## 2022-10-12 DIAGNOSIS — M4316 Spondylolisthesis, lumbar region: Secondary | ICD-10-CM

## 2022-10-12 DIAGNOSIS — M159 Polyosteoarthritis, unspecified: Secondary | ICD-10-CM | POA: Diagnosis present

## 2022-10-12 DIAGNOSIS — M2428 Disorder of ligament, vertebrae: Secondary | ICD-10-CM | POA: Diagnosis present

## 2022-10-12 DIAGNOSIS — M79604 Pain in right leg: Secondary | ICD-10-CM | POA: Diagnosis present

## 2022-10-12 DIAGNOSIS — Z79899 Other long term (current) drug therapy: Secondary | ICD-10-CM | POA: Insufficient documentation

## 2022-10-12 DIAGNOSIS — M79642 Pain in left hand: Secondary | ICD-10-CM | POA: Diagnosis present

## 2022-10-12 DIAGNOSIS — G8929 Other chronic pain: Secondary | ICD-10-CM | POA: Insufficient documentation

## 2022-10-12 DIAGNOSIS — M48061 Spinal stenosis, lumbar region without neurogenic claudication: Secondary | ICD-10-CM | POA: Diagnosis present

## 2022-10-12 DIAGNOSIS — M47816 Spondylosis without myelopathy or radiculopathy, lumbar region: Secondary | ICD-10-CM

## 2022-10-12 DIAGNOSIS — M48062 Spinal stenosis, lumbar region with neurogenic claudication: Secondary | ICD-10-CM | POA: Diagnosis present

## 2022-10-12 DIAGNOSIS — Z79891 Long term (current) use of opiate analgesic: Secondary | ICD-10-CM | POA: Diagnosis present

## 2022-10-12 DIAGNOSIS — M0600 Rheumatoid arthritis without rheumatoid factor, unspecified site: Secondary | ICD-10-CM | POA: Insufficient documentation

## 2022-10-12 DIAGNOSIS — G894 Chronic pain syndrome: Secondary | ICD-10-CM

## 2022-10-12 DIAGNOSIS — M5441 Lumbago with sciatica, right side: Secondary | ICD-10-CM | POA: Diagnosis not present

## 2022-10-12 DIAGNOSIS — M79605 Pain in left leg: Secondary | ICD-10-CM

## 2022-10-12 DIAGNOSIS — M25552 Pain in left hip: Secondary | ICD-10-CM | POA: Diagnosis present

## 2022-10-12 DIAGNOSIS — M5137 Other intervertebral disc degeneration, lumbosacral region: Secondary | ICD-10-CM

## 2022-10-12 DIAGNOSIS — M5442 Lumbago with sciatica, left side: Secondary | ICD-10-CM | POA: Diagnosis not present

## 2022-10-12 MED ORDER — NALOXONE HCL 4 MG/0.1ML NA LIQD: 1.0000 | NASAL | 0 refills | Status: AC | PRN

## 2022-10-12 MED ORDER — OXYCODONE HCL 5 MG PO TABS
5.0000 mg | ORAL_TABLET | Freq: Four times a day (QID) | ORAL | 0 refills | Status: DC | PRN
Start: 2022-11-17 — End: 2023-01-08

## 2022-10-12 MED ORDER — OXYCODONE HCL 5 MG PO TABS: 5.0000 mg | ORAL_TABLET | Freq: Four times a day (QID) | ORAL | 0 refills | Status: DC | PRN

## 2022-10-12 NOTE — Progress Notes (Signed)
Nursing Pain Medication Assessment:  Safety precautions to be maintained throughout the outpatient stay will include: orient to surroundings, keep bed in low position, maintain call bell within reach at all times, provide assistance with transfer out of bed and ambulation.  Medication Inspection Compliance: Pill count conducted under aseptic conditions, in front of the patient. Neither the pills nor the bottle was removed from the patient's sight at any time. Once count was completed pills were immediately returned to the patient in their original bottle.  Medication: Oxycodone IR Pill/Patch Count:  24 of 120 pills remain Pill/Patch Appearance: Markings consistent with prescribed medication Bottle Appearance: Standard pharmacy container. Clearly labeled. Filled Date: 21 / 8 / 2024 Last Medication intake:  TodaySafety precautions to be maintained throughout the outpatient stay will include: orient to surroundings, keep bed in low position, maintain call bell within reach at all times, provide assistance with transfer out of bed and ambulation.

## 2022-10-13 ENCOUNTER — Other Ambulatory Visit: Payer: Self-pay

## 2022-10-13 DIAGNOSIS — Z01818 Encounter for other preprocedural examination: Secondary | ICD-10-CM

## 2022-10-25 NOTE — Telephone Encounter (Signed)
Received clearance form from Laura Neu, PA-C from Rheumatology that states "we recommend remicade hold 6 weeks prior to surgery and 2-3 weeks after surgery based on 2017 ACR/American Academy of hip/knee surgery joint guidelines. She likely has limited physiologic reserves". Per discussion with Dr Micheline Chapman, he would still like her to hold remicade for 4 weeks after surgery to allow time for optimal wound healing.

## 2022-10-26 ENCOUNTER — Telehealth: Payer: Self-pay

## 2022-10-26 ENCOUNTER — Inpatient Hospital Stay: Admission: RE | Admit: 2022-10-26 | Payer: PRIVATE HEALTH INSURANCE | Source: Ambulatory Visit

## 2022-10-26 NOTE — Telephone Encounter (Signed)
Pt's daughter called in with questions regarding her mother's upcoming surgery:  We discussed the plan for 23 hour observation and what that means.  She inquired if Laura [name] will be sent to a SNF after surgery. I explained that will be based on her needs and determined after her physical therapy evaluation in the hospital.  I discussed that the rheumatology clearance form stated they would like her to hold remicade for 6 weeks prior and 2-3 weeks after, but that Dr Myer Haff would like her to stay with the 4 weeks after that we previously discussed.  She mentioned that they may need to postpone surgery, as Laura [name] isn't sure if she wants to have the surgery, and she is waiting to see cardiology for clearance. She will discuss this with her mother and contact us if they decide to postpone and/or if they cannot see cardiology in time.

## 2022-10-27 ENCOUNTER — Telehealth: Payer: Self-pay | Admitting: Neurosurgery

## 2022-10-27 ENCOUNTER — Inpatient Hospital Stay: Admission: RE | Admit: 2022-10-27 | Payer: PRIVATE HEALTH INSURANCE | Source: Ambulatory Visit

## 2022-10-27 NOTE — Telephone Encounter (Signed)
Patient's daughter has called stating that she needs to reschedule the surgery for her mother. States she has an appointment on the 29th for a stress test and believes her mother really needs to go. Also having trouble getting out of work. Could possible reschedule for November. Please give patients daughter a call

## 2022-10-28 ENCOUNTER — Encounter: Payer: Self-pay | Admitting: Intensive Care

## 2022-10-28 ENCOUNTER — Emergency Department: Payer: Medicare Other

## 2022-10-28 ENCOUNTER — Observation Stay
Admission: EM | Admit: 2022-10-28 | Discharge: 2022-10-29 | Disposition: A | Discharge status: Home / Self Care | Payer: Medicare Other | Attending: Internal Medicine | Admitting: Internal Medicine

## 2022-10-28 ENCOUNTER — Other Ambulatory Visit: Payer: Self-pay

## 2022-10-28 DIAGNOSIS — Z7982 Long term (current) use of aspirin: Secondary | ICD-10-CM | POA: Diagnosis not present

## 2022-10-28 DIAGNOSIS — I129 Hypertensive chronic kidney disease with stage 1 through stage 4 chronic kidney disease, or unspecified chronic kidney disease: Secondary | ICD-10-CM | POA: Diagnosis not present

## 2022-10-28 DIAGNOSIS — Z6841 Body Mass Index (BMI) 40.0 and over, adult: Secondary | ICD-10-CM | POA: Diagnosis not present

## 2022-10-28 DIAGNOSIS — N390 Urinary tract infection, site not specified: Secondary | ICD-10-CM | POA: Diagnosis not present

## 2022-10-28 DIAGNOSIS — N189 Chronic kidney disease, unspecified: Secondary | ICD-10-CM | POA: Diagnosis not present

## 2022-10-28 DIAGNOSIS — M48062 Spinal stenosis, lumbar region with neurogenic claudication: Principal | ICD-10-CM

## 2022-10-28 DIAGNOSIS — M48061 Spinal stenosis, lumbar region without neurogenic claudication: Secondary | ICD-10-CM | POA: Diagnosis not present

## 2022-10-28 DIAGNOSIS — J45909 Unspecified asthma, uncomplicated: Secondary | ICD-10-CM | POA: Diagnosis not present

## 2022-10-28 DIAGNOSIS — R159 Full incontinence of feces: Secondary | ICD-10-CM | POA: Diagnosis not present

## 2022-10-28 DIAGNOSIS — R4182 Altered mental status, unspecified: Secondary | ICD-10-CM | POA: Diagnosis present

## 2022-10-28 DIAGNOSIS — N3090 Cystitis, unspecified without hematuria: Secondary | ICD-10-CM

## 2022-10-28 DIAGNOSIS — E119 Type 2 diabetes mellitus without complications: Secondary | ICD-10-CM

## 2022-10-28 DIAGNOSIS — E039 Hypothyroidism, unspecified: Secondary | ICD-10-CM | POA: Diagnosis not present

## 2022-10-28 DIAGNOSIS — Z79899 Other long term (current) drug therapy: Secondary | ICD-10-CM | POA: Insufficient documentation

## 2022-10-28 DIAGNOSIS — Z96653 Presence of artificial knee joint, bilateral: Secondary | ICD-10-CM | POA: Diagnosis not present

## 2022-10-28 DIAGNOSIS — M069 Rheumatoid arthritis, unspecified: Secondary | ICD-10-CM | POA: Insufficient documentation

## 2022-10-28 HISTORY — DX: Essential (primary) hypertension: I10

## 2022-10-28 LAB — BASIC METABOLIC PANEL
Anion gap: 12 (ref 5–15)
BUN: 21 mg/dL (ref 8–23)
CO2: 27 mmol/L (ref 22–32)
Calcium: 9.6 mg/dL (ref 8.9–10.3)
Chloride: 100 mmol/L (ref 98–111)
Creatinine, Ser: 0.93 mg/dL (ref 0.44–1.00)
GFR, Estimated: >60 mL/min (ref >60)
Glucose, Bld: 165 mg/dL — ABNORMAL HIGH (ref 70–99)
Potassium: 3.7 mmol/L (ref 3.5–5.1)
Sodium: 139 mmol/L (ref 135–145)

## 2022-10-28 LAB — CBC
HCT: 37.7 % (ref 36.0–46.0)
Hemoglobin: 12.4 g/dL (ref 12.0–15.0)
MCH: 29.7 pg (ref 26.0–34.0)
MCHC: 32.9 g/dL (ref 30.0–36.0)
MCV: 90.4 fL (ref 80.0–100.0)
Platelets: 247 10*3/uL (ref 150–400)
RBC: 4.17 MIL/uL (ref 3.87–5.11)
RDW: 13.6 % (ref 11.5–15.5)
WBC: 6.2 10*3/uL (ref 4.0–10.5)
nRBC: 0 % (ref 0.0–0.2)

## 2022-10-28 NOTE — Telephone Encounter (Signed)
I spoke with Laura [name]. She has had urinary incontinence, numbness and weakness over the past couple of years that has been progressively worsening. However, the fecal incontinence is new since last night. Laura [name] also reports that she has also been more confused lately. I stressed the importance for them to present to the ER for urgent evaluation due to these new concerning symptoms.  In the meantime, they would like to reschedule surgery to 11/30/22 if her ER visit does not result in surgery this weekend.   I also attempted to call the patient to discuss, but her phone went straight to voicemail, so I left a message for her to return my call.

## 2022-10-28 NOTE — ED Notes (Signed)
Patient states " having a headache 7/10, and back pain 9/10."

## 2022-10-28 NOTE — Telephone Encounter (Signed)
Patient's daughter, Marisue Ivan is calling to let our office know that she would like to see about rescheduling her mother's surgery for after 11/22/2022. She states that she spoke with her mother this morning and her mother states that she began having fecal incontinence yesterday evening.

## 2022-10-28 NOTE — ED Triage Notes (Signed)
Arrives via [facility]>  C/O Bladder incontinence since August. Patient cancelled surgery. Arrives C?O bowel incontinence, onset yesterday.  VS wnl.

## 2022-10-28 NOTE — ED Notes (Signed)
Patient taken to MRI at this time 

## 2022-10-28 NOTE — ED Provider Notes (Signed)
[facility] Provider Note    Event Date/Time   First MD Initiated Contact with Patient 10/28/22 2016     (approximate)   History   Chief Complaint: Encopresis and Altered Mental Status   HPI  Laura [name] is a 77 y.o. female with a history of CKD, spinal stenosis, wheelchair dependent who is brought to the ED at the recommendation of their outpatient neurosurgeon Dr. Marcell Barlow for MRI.  Patient has longstanding spinal stenosis symptoms including chronic low back pain with sciatica, bilateral lower extremity weakness, urinary incontinence.  Lives with her elderly spouse who is also wheelchair dependent and has neuropathy and frail.  Over the last 4 days, patient started having stool incontinence as well.  They contacted neurosurgery who advised she would need MRI to rule out cauda equina.  Denies fever.     Physical Exam   Triage Vital Signs: ED Triage Vitals  Encounter Vitals Group     BP 10/28/22 1314 131/81     Systolic BP Percentile --      Diastolic BP Percentile --      Pulse Rate 10/28/22 1314 70     Resp 10/28/22 1314 18     Temp 10/28/22 1314 98.3 F (36.8 C)     Temp Source 10/28/22 1314 Oral     SpO2 10/28/22 1314 99 %     Weight 10/28/22 1315 265 lb (120.2 kg)     Height 10/28/22 1315 5\' 6"  (1.676 m)     Head Circumference --      Peak Flow --      Pain Score 10/28/22 1314 5     Pain Loc --      Pain Education --      Exclude from Growth Chart --     Most recent vital signs: Vitals:   10/28/22 1314 10/28/22 1646  BP: 131/81 (!) 147/97  Pulse: 70 68  Resp: 18 15  Temp: 98.3 F (36.8 C) 98.8 F (37.1 C)  SpO2: 99% 100%    General: Awake, no distress.  CV:  Good peripheral perfusion.  Resp:  Normal effort.  Abd:  No distention.  Soft nontender    ED Results / Procedures / Treatments   Labs (all labs ordered are listed, but only abnormal results are displayed) Labs Reviewed  BASIC METABOLIC PANEL - Abnormal;  Notable for the following components:      Result Value   Glucose, Bld 165 (*)    All other components within normal limits  CBC  URINALYSIS, ROUTINE W REFLEX MICROSCOPIC     EKG Interpreted by me Sinus rhythm, rate of 69.  Normal axis, normal intervals.  Normal QRS ST segments and T waves   RADIOLOGY MRI lumbar spine interpreted by me, negative for fracture.  Radiology report reviewed, noting chronic spinal stenosis  MRI brain unremarkable   PROCEDURES:  Procedures   MEDICATIONS ORDERED IN ED: Medications - No data to display   IMPRESSION / MDM / ASSESSMENT AND PLAN / ED COURSE  I reviewed the triage vital signs and the nursing notes.  DDx: Chronic spinal stenosis, viral illness, dehydration, electrolyte abnormality, cauda equina syndrome  Patient's presentation is most consistent with acute presentation with potential threat to life or bodily function.  Patient presents with worsening spinal stenosis symptoms, now with stool incontinence.  Vital signs unremarkable, no new pain complaints.  Will obtain urinalysis to rule out UTI.  Serum labs unremarkable, MRIs do not show acute surgical emergency.  Discussed with neurosurgery Dr. Katrinka Blazing who will consult on the patient in the morning.  Discussed with hospitalist for further management.       FINAL CLINICAL IMPRESSION(S) / ED DIAGNOSES   Final diagnoses:  Spinal stenosis of lumbar region with neurogenic claudication     Rx / DC Orders   ED Discharge Orders     None        Note:  This document was prepared using Dragon voice recognition software and may include unintentional dictation errors.   Sharman Cheek, MD 10/28/22 [phone]

## 2022-10-28 NOTE — ED Triage Notes (Signed)
Patient c/o stool incontinence for a few days and AMS for weeks. Daughter reports urinary incontinence for years. Patient baseline uses wheelchair but can transfer  Patient alert to self, place and situation. Disoriented to time. History memory loss.

## 2022-10-29 DIAGNOSIS — N390 Urinary tract infection, site not specified: Secondary | ICD-10-CM | POA: Diagnosis not present

## 2022-10-29 DIAGNOSIS — R150 Incomplete defecation: Secondary | ICD-10-CM

## 2022-10-29 DIAGNOSIS — M48062 Spinal stenosis, lumbar region with neurogenic claudication: Secondary | ICD-10-CM

## 2022-10-29 DIAGNOSIS — E119 Type 2 diabetes mellitus without complications: Secondary | ICD-10-CM

## 2022-10-29 DIAGNOSIS — R159 Full incontinence of feces: Secondary | ICD-10-CM | POA: Diagnosis not present

## 2022-10-29 DIAGNOSIS — M069 Rheumatoid arthritis, unspecified: Secondary | ICD-10-CM | POA: Insufficient documentation

## 2022-10-29 DIAGNOSIS — M48061 Spinal stenosis, lumbar region without neurogenic claudication: Secondary | ICD-10-CM

## 2022-10-29 LAB — URINALYSIS, ROUTINE W REFLEX MICROSCOPIC
Bilirubin Urine: NEGATIVE
Glucose, UA: NEGATIVE mg/dL
Hgb urine dipstick: NEGATIVE
Ketones, ur: NEGATIVE mg/dL
Nitrite: POSITIVE — AB
Protein, ur: 30 mg/dL — AB
Specific Gravity, Urine: 1.021 (ref 1.005–1.030)
pH: 5 (ref 5.0–8.0)

## 2022-10-29 LAB — URINALYSIS, W/ REFLEX TO CULTURE (INFECTION SUSPECTED)
Bilirubin Urine: NEGATIVE
Glucose, UA: NEGATIVE mg/dL
Hgb urine dipstick: NEGATIVE
Ketones, ur: NEGATIVE mg/dL
Nitrite: POSITIVE — AB
Protein, ur: 30 mg/dL — AB
Specific Gravity, Urine: 1.023 (ref 1.005–1.030)
Squamous Epithelial / HPF: 0 /[HPF] (ref 0–5)
pH: 6 (ref 5.0–8.0)

## 2022-10-29 LAB — BASIC METABOLIC PANEL
Anion gap: 12 (ref 5–15)
BUN: 18 mg/dL (ref 8–23)
CO2: 25 mmol/L (ref 22–32)
Calcium: 9.1 mg/dL (ref 8.9–10.3)
Chloride: 100 mmol/L (ref 98–111)
Creatinine, Ser: 0.88 mg/dL (ref 0.44–1.00)
GFR, Estimated: >60 mL/min (ref >60)
Glucose, Bld: 130 mg/dL — ABNORMAL HIGH (ref 70–99)
Potassium: 4.1 mmol/L (ref 3.5–5.1)
Sodium: 137 mmol/L (ref 135–145)

## 2022-10-29 LAB — CBC
HCT: 38 % (ref 36.0–46.0)
Hemoglobin: 12.4 g/dL (ref 12.0–15.0)
MCH: 30 pg (ref 26.0–34.0)
MCHC: 32.6 g/dL (ref 30.0–36.0)
MCV: 92 fL (ref 80.0–100.0)
Platelets: 281 10*3/uL (ref 150–400)
RBC: 4.13 MIL/uL (ref 3.87–5.11)
RDW: 13.9 % (ref 11.5–15.5)
WBC: 7.8 10*3/uL (ref 4.0–10.5)
nRBC: 0 % (ref 0.0–0.2)

## 2022-10-29 MED ORDER — NALOXONE HCL 4 MG/0.1ML NA LIQD: 1.0000 | NASAL | Status: DC | PRN

## 2022-10-29 MED ORDER — VITAMIN D3 25 MCG (1000 UNIT) PO TABS: 1000.0000 [IU] | ORAL_TABLET | Freq: Every day | ORAL | Status: DC

## 2022-10-29 MED ORDER — DOXEPIN HCL 10 MG PO CAPS: 20.0000 mg | ORAL_CAPSULE | Freq: Every day | ORAL | Status: DC

## 2022-10-29 MED ORDER — DULOXETINE HCL 60 MG PO CPEP
60.0000 mg | ORAL_CAPSULE | Freq: Every day | ORAL | Status: DC
  Administered 2022-10-29: 60 mg via ORAL
  Filled 2022-10-29: qty 1

## 2022-10-29 MED ORDER — METHOTREXATE SODIUM 2.5 MG PO TABS: 10.0000 mg | ORAL_TABLET | ORAL | Status: DC

## 2022-10-29 MED ORDER — OXYCODONE HCL 5 MG PO TABS: 5.0000 mg | ORAL_TABLET | Freq: Four times a day (QID) | ORAL | Status: DC | PRN

## 2022-10-29 MED ORDER — FOSFOMYCIN TROMETHAMINE 3 G PO PACK
3.0000 g | PACK | Freq: Once | ORAL | Status: AC
  Administered 2022-10-29: 3 g via ORAL
  Filled 2022-10-29: qty 3

## 2022-10-29 MED ORDER — TRAZODONE HCL 50 MG PO TABS: 25.0000 mg | ORAL_TABLET | Freq: Every evening | ORAL | Status: DC | PRN

## 2022-10-29 MED ORDER — POTASSIUM CHLORIDE CRYS ER 10 MEQ PO TBCR: 10.0000 meq | EXTENDED_RELEASE_TABLET | ORAL | Status: DC

## 2022-10-29 MED ORDER — CALCIUM CARBONATE ANTACID 500 MG PO CHEW
1.0000 | CHEWABLE_TABLET | Freq: Every day | ORAL | Status: DC
  Administered 2022-10-29: 200 mg via ORAL
  Filled 2022-10-29: qty 1

## 2022-10-29 MED ORDER — ROSUVASTATIN CALCIUM 5 MG PO TABS: 5.0000 mg | ORAL_TABLET | ORAL | Status: DC

## 2022-10-29 MED ORDER — ALBUTEROL SULFATE (2.5 MG/3ML) 0.083% IN NEBU: 2.5000 mg | INHALATION_SOLUTION | Freq: Four times a day (QID) | RESPIRATORY_TRACT | Status: DC | PRN

## 2022-10-29 MED ORDER — VITAMIN D 25 MCG (1000 UNIT) PO TABS
1000.0000 [IU] | ORAL_TABLET | Freq: Every day | ORAL | Status: DC
  Administered 2022-10-29: 1000 [IU] via ORAL
  Filled 2022-10-29: qty 1

## 2022-10-29 MED ORDER — ASPIRIN 325 MG PO TBEC: 325.0000 mg | DELAYED_RELEASE_TABLET | Freq: Every day | ORAL | Status: DC

## 2022-10-29 MED ORDER — METOPROLOL SUCCINATE ER 50 MG PO TB24
100.0000 mg | ORAL_TABLET | Freq: Every day | ORAL | Status: DC
  Administered 2022-10-29: 100 mg via ORAL
  Filled 2022-10-29: qty 2

## 2022-10-29 MED ORDER — ALBUTEROL SULFATE HFA 108 (90 BASE) MCG/ACT IN AERS: 2.0000 | INHALATION_SPRAY | Freq: Four times a day (QID) | RESPIRATORY_TRACT | Status: DC | PRN

## 2022-10-29 MED ORDER — ONDANSETRON HCL 4 MG PO TABS: 4.0000 mg | ORAL_TABLET | Freq: Four times a day (QID) | ORAL | Status: DC | PRN

## 2022-10-29 MED ORDER — FUROSEMIDE 40 MG PO TABS: 80.0000 mg | ORAL_TABLET | ORAL | Status: DC

## 2022-10-29 MED ORDER — ACETAMINOPHEN 325 MG PO TABS: 650.0000 mg | ORAL_TABLET | Freq: Four times a day (QID) | ORAL | Status: DC | PRN

## 2022-10-29 MED ORDER — ACETAMINOPHEN 325 MG RE SUPP: 650.0000 mg | Freq: Four times a day (QID) | RECTAL | Status: DC | PRN

## 2022-10-29 MED ORDER — ONDANSETRON HCL 4 MG/2ML IJ SOLN: 4.0000 mg | Freq: Four times a day (QID) | INTRAMUSCULAR | Status: DC | PRN

## 2022-10-29 MED ORDER — LEVOTHYROXINE SODIUM 125 MCG PO TABS
250.0000 ug | ORAL_TABLET | Freq: Every day | ORAL | Status: DC
  Administered 2022-10-29: 250 ug via ORAL
  Filled 2022-10-29: qty 2

## 2022-10-29 MED ORDER — ENOXAPARIN SODIUM 60 MG/0.6ML IJ SOSY
60.0000 mg | PREFILLED_SYRINGE | INTRAMUSCULAR | Status: DC
  Administered 2022-10-29: 60 mg via SUBCUTANEOUS
  Filled 2022-10-29: qty 0.6

## 2022-10-29 MED ORDER — SODIUM CHLORIDE 0.9 % IV SOLN
2.0000 g | INTRAVENOUS | Status: DC
  Administered 2022-10-29: 2 g via INTRAVENOUS
  Filled 2022-10-29: qty 20

## 2022-10-29 MED ORDER — PANTOPRAZOLE SODIUM 40 MG PO TBEC
40.0000 mg | DELAYED_RELEASE_TABLET | Freq: Every day | ORAL | Status: DC
  Administered 2022-10-29: 40 mg via ORAL
  Filled 2022-10-29: qty 1

## 2022-10-29 MED ORDER — LORATADINE 10 MG PO TABS
10.0000 mg | ORAL_TABLET | Freq: Every day | ORAL | Status: DC
  Administered 2022-10-29: 10 mg via ORAL
  Filled 2022-10-29: qty 1

## 2022-10-29 MED ORDER — FOLIC ACID 1 MG PO TABS
1.0000 mg | ORAL_TABLET | Freq: Every day | ORAL | Status: DC
  Administered 2022-10-29: 1 mg via ORAL
  Filled 2022-10-29: qty 1

## 2022-10-29 NOTE — Assessment & Plan Note (Signed)
-   We will continue methotrexate and Plaquenil.

## 2022-10-29 NOTE — Assessment & Plan Note (Signed)
-   The patient will be on supplemental coverage with NovoLog.

## 2022-10-29 NOTE — Assessment & Plan Note (Signed)
-   We will continue Synthroid. 

## 2022-10-29 NOTE — ED Notes (Signed)
Patient is a hard stick, waiting on U/S IV to get blood Cultures and give IV Abx

## 2022-10-29 NOTE — Assessment & Plan Note (Signed)
>>  ASSESSMENT AND PLAN FOR DM2 (DIABETES MELLITUS, TYPE 2) (HCC) WRITTEN ON 10/29/2022  6:22 AM BY MANSY, JAN A, MD  - The patient will be on supplemental coverage with NovoLog .

## 2022-10-29 NOTE — Assessment & Plan Note (Signed)
>>  ASSESSMENT AND PLAN FOR LUMBAR SPINAL STENOSIS WRITTEN ON 10/29/2022  6:19 AM BY MANSY, JAN A, MD  - Neurosurgery consult to be obtained. - I notified Dr. Claudene about the patient.

## 2022-10-29 NOTE — Discharge Summary (Signed)
Physician Discharge Summary   Patient: Laura [name] MRN: [id number] DOB: 1945/05/19  Admit date:     10/28/2022  Discharge date: 10/29/22  Discharge Physician: Marrion Coy   PCP: Lynnea Ferrier, MD   Recommendations at discharge:   Follow-up with PCP in 1 week. Follow-up with neurosurgery as previously scheduled.  Discharge Diagnoses: Principal Problem:   Stool incontinence Active Problems:   Lumbar spinal stenosis   Hypothyroidism   Acute lower UTI   Rheumatoid arthritis (HCC)   Type 2 diabetes mellitus without complications (HCC) Morbid obesity with BMI of 42.77. Resolved Problems:   * No resolved hospital problems. *  Hospital Course:  Laura [name] is a 77 y.o. Caucasian female with medical history significant for asthma, type 2 diabetes mellitus anxiety, osteoarthritis, depression, hypertension, and seizure disorder as well as hypothyroidism, who presented to the emergency room  with acute onset of new onset stool incontinence started on Wednesday.  Due to known lumbar spine stenosis, neurosurgery was consulted.  Dr. Katrinka Blazing seen the patient, discussed with him, patient does not need emergent surgery.  Bowel incontinence was associated with diarrhea, neurosurgery recommended follow-up with them as outpatient.  At this point, patient does not have any other complaints.  Medically stable for discharge. Assessment and Plan:  * Stool incontinence Lumbar spinal stenosis Patient has know lumbar spinal stenosis with some leg weakness.  New onset of stool incontinence.  Per neurosurgery, this appears to be secondary to diarrhea.  At this point, neurosurgery does not believe patient needs emergent surgery.  The recommendation is to follow-up with them in the office.  At this point, patient will be discharged. MRI brain did not show any stroke. MRI lumbar spine showed severe L3-L4 stenosis, not changed from previous imaging study.  Type 2 diabetes mellitus without  complications (HCC) Resume home treatment.  Rheumatoid arthritis (HCC) - We will continue methotrexate and Plaquenil.  Acute lower UTI Patient is treated with Rocephin, urine culture pending.  Will give dose of fosfomycin and discharge.  Hypothyroidism Continue Synthroid.        Consultants: Neurosurgery. Procedures performed: None  Disposition: Home Diet recommendation:  Discharge Diet Orders (From admission, onward)     Start     Ordered   10/29/22 0000  Diet - low sodium heart healthy        10/29/22 1455           Cardiac diet DISCHARGE MEDICATION: Allergies as of 10/29/2022       Reactions   Bupropion Other (See Comments)   Siezures   Jardiance [empagliflozin] Other (See Comments)   Projectile vomiting   Penicillins Nausea And Vomiting, Rash        Medication List     TAKE these medications    albuterol 108 (90 Base) MCG/ACT inhaler Commonly known as: VENTOLIN HFA Inhale 2 puffs into the lungs every 6 (six) hours as needed for wheezing or shortness of breath.   aspirin EC 325 MG tablet Take 325 mg by mouth daily.   CALCIUM 1200+D3 PO Take 2 tablets by mouth daily.   calcium carbonate 500 MG chewable tablet Commonly known as: TUMS - dosed in mg elemental calcium Chew 1 tablet by mouth daily.   cholecalciferol 25 MCG (1000 UNIT) tablet Commonly known as: VITAMIN D3 Take 1 tablet (1,000 Units total) by mouth daily. With Mg What changed:  medication strength how much to take   docusate sodium 100 MG capsule Commonly known as: COLACE Take 100 mg by  mouth daily as needed for mild constipation.   doxepin 10 MG capsule Commonly known as: SINEQUAN Take 20 mg by mouth at bedtime.   DULoxetine 20 MG capsule Commonly known as: CYMBALTA TAKE 1 CAPSULE (20 MG TOTAL) BY MOUTH DAILY. TAKE ALONG WITH 60 MG DAILY. What changed: Another medication with the same name was removed. Continue taking this medication, and follow the directions you see  here.   esomeprazole 20 MG capsule Commonly known as: NEXIUM Take 20 mg by mouth daily.   folic acid 1 MG tablet Commonly known as: FOLVITE Take 1 mg by mouth daily.   furosemide 80 MG tablet Commonly known as: LASIX Take 80 mg by mouth every Monday, Wednesday, and Friday.   hydroxychloroquine 200 MG tablet Commonly known as: PLAQUENIL Take 200 mg by mouth daily.   inFLIXimab 100 MG injection Commonly known as: REMICADE Inject 100 mg into the vein every 8 (eight) weeks.   levothyroxine 125 MCG tablet Commonly known as: SYNTHROID Take 250 mcg by mouth daily before breakfast.   loratadine 10 MG tablet Commonly known as: CLARITIN Take 10 mg by mouth daily.   methotrexate 2.5 MG tablet Commonly known as: RHEUMATREX Take 15 mg by mouth every Sunday.   metoprolol succinate 100 MG 24 hr tablet Commonly known as: TOPROL-XL Take 1 tablet (100 mg total) by mouth daily. Take with or immediately following a meal.   naloxone 4 MG/0.1ML Liqd nasal spray kit Commonly known as: NARCAN Place 1 spray into the nose as needed for up to 365 doses (for opioid-induced respiratory depresssion). In case of emergency (overdose), spray once into each nostril. If no response within 3 minutes, repeat application and call 911.   oxyCODONE 5 MG immediate release tablet Commonly known as: Oxy IR/ROXICODONE Take 1 tablet (5 mg total) by mouth every 6 (six) hours as needed for severe pain. Must last 30 days Start taking on: November 17, 2022 What changed: Another medication with the same name was removed. Continue taking this medication, and follow the directions you see here.   potassium chloride 10 MEQ tablet Commonly known as: KLOR-CON Take 10 mEq by mouth every Monday, Wednesday, and Friday.   rosuvastatin 5 MG tablet Commonly known as: CRESTOR Take 5 mg by mouth 2 (two) times a week. (Sunday and Friday)        Follow-up Information     Curtis Sites III, MD Follow up in 1 week(s).    Specialty: Internal Medicine Contact information: [address] [facility] [place] [address] [phone]         Venetia Night, MD Follow up.   Specialty: Neurosurgery Why: as previously scheduled Contact information: [address] [place] [address] [phone]                Discharge Exam: Ceasar Mons Weights   10/28/22 1315  Weight: 120.2 kg   General exam: Appears calm and comfortable  Respiratory system: Clear to auscultation. Respiratory effort normal. Cardiovascular system: S1 & S2 heard, RRR. No JVD, murmurs, rubs, gallops or clicks. No pedal edema. Gastrointestinal system: Abdomen is nondistended, soft and nontender. No organomegaly or masses felt. Normal bowel sounds heard. Central nervous system: Alert and oriented. No focal neurological deficits. Extremities: Symmetric 5 x 5 power. Skin: No rashes, lesions or ulcers Psychiatry: Judgement and insight appear normal. Mood & affect appropriate.    Condition at discharge: good  The results of significant diagnostics from this hospitalization (including imaging, microbiology,  ancillary and laboratory) are listed below for reference.   Imaging Studies: MR LUMBAR SPINE WO CONTRAST  Result Date: 10/28/2022 CLINICAL DATA:  Low back pain, incontinence, cauda equina syndrome suspected EXAM: MRI LUMBAR SPINE WITHOUT CONTRAST TECHNIQUE: Multiplanar, multisequence MR imaging of the lumbar spine was performed. No intravenous contrast was administered. COMPARISON:  04/29/2021 FINDINGS: Segmentation:  5 lumbar type vertebral bodies. Alignment:  Trace anterolisthesis of L3 on L4 and L4 on L5. Vertebrae: No acute fracture, evidence of discitis, or suspicious osseous lesion. Interval sacroiliac fixation. Conus medullaris and cauda equina: Conus extends to the T12-L1 level. Again noted is cauda equina redundancy secondary to severe spinal canal stenosis at L3-L4. Paraspinal  and other soft tissues: Atrophy of the posterior paraspinous muscles. Disc levels: T12-L1: No significant disc bulge. No spinal canal stenosis or neural foraminal narrowing. L1-L2: Mild disc bulge with new right subarticular disc protrusion, which narrows the right lateral recess. Mild facet arthropathy. Unchanged mild spinal canal stenosis. No neural foraminal narrowing. L2-L3: Minimal disc bulge. Moderate facet arthropathy. Ligamentum flavum hypertrophy. Narrowing of the lateral recesses. Moderate spinal canal stenosis and mild bilateral neural foraminal narrowing, unchanged. L3-L4: Trace anterolisthesis with disc unroofing and mild disc bulge. Severe facet arthropathy. Ligamentum flavum hypertrophy. Effacement of the lateral recesses. Severe spinal canal stenosis and mild bilateral neural foraminal narrowing, unchanged from the prior exam. L4-L5: Trace anterolisthesis and disc unroofing with mild disc bulge. Severe facet arthropathy. Ligamentum flavum hypertrophy. Effacement of the lateral recesses. Mild to moderate spinal canal stenosis, unchanged. No significant neural foraminal narrowing. L5-S1: Evaluation is somewhat limited by susceptibility artifact from the patient's sacroiliac screw. Within this limitation, mild disc bulge. Moderate facet arthropathy. No spinal canal stenosis. Mild left neural foraminal narrowing, unchanged. IMPRESSION: 1. L3-L4 severe spinal canal stenosis and mild bilateral neural foraminal narrowing, unchanged. Effacement of the lateral recesses at this level likely compresses the descending L4 nerve roots. 2. L2-L3 moderate spinal canal stenosis and mild bilateral neural foraminal narrowing, unchanged. Narrowing of the lateral recesses at this level could affect the descending L3 nerve roots. 3. L4-L5 mild to moderate spinal canal stenosis, unchanged. Effacement of the lateral recesses at this level likely affects the descending L5 nerve roots. 4. L1-L2 mild spinal canal stenosis,  unchanged. Narrowing of the right lateral recess at this level could affect the descending right L2 nerve roots. 5. Multilevel facet arthropathy, which is severe at L3-L4 and L4-L5, which can be a cause of back pain. Electronically Signed   By: Wiliam Ke M.D.   On: 10/28/2022 21:28   MR BRAIN WO CONTRAST  Result Date: 10/28/2022 CLINICAL DATA:  Altered mental status EXAM: MRI HEAD WITHOUT CONTRAST TECHNIQUE: Multiplanar, multiecho pulse sequences of the brain and surrounding structures were obtained without intravenous contrast. COMPARISON:  01/16/2020 MRI head FINDINGS: Brain: No restricted diffusion to suggest acute or subacute infarct. No acute hemorrhage, mass, mass effect, or midline shift. No hydrocephalus or extra-axial collection. Partial empty sella with slightly expanded sella. Normal craniocervical junction. No hemosiderin deposition to suggest remote hemorrhage. 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 T2 hyperintense signal in the periventricular white matter, likely the sequela of chronic small vessel ischemic disease. Vascular: Normal arterial flow voids. Skull and upper cervical spine: Normal marrow signal. Sinuses/Orbits: Clear paranasal sinuses. No acute finding in the orbits. Status post bilateral lens replacements. Other: Trace fluid in the left mastoid air cells. IMPRESSION: No acute intracranial process. No evidence of acute or subacute infarct. Electronically Signed   By:  Wiliam Ke M.D.   On: 10/28/2022 21:18    Microbiology: Results for orders placed or performed during the hospital encounter of 10/28/22  Culture, blood (Routine X 2) w Reflex to ID Panel     Status: None (Preliminary result)   Collection Time: 10/29/22  6:54 AM   Specimen: BLOOD RIGHT ARM  Result Value Ref Range Status   Specimen Description BLOOD RIGHT ARM  Final   Special Requests   Final    BOTTLES DRAWN AEROBIC AND ANAEROBIC Blood Culture adequate volume   Culture  Setup Time   Final     ANAEROBIC BOTTLE ONLY Performed at [facility], [address] [place], [place] [address]    Culture PENDING  Incomplete   Report Status PENDING  Incomplete    Labs: CBC: Recent Labs  Lab 10/28/22 1348 10/29/22 0547  WBC 6.2 7.8  HGB 12.4 12.4  HCT 37.7 38.0  MCV 90.4 92.0  PLT 247 281   Basic Metabolic Panel: Recent Labs  Lab 10/28/22 1348 10/29/22 0548  NA 139 137  K 3.7 4.1  CL 100 100  CO2 27 25  GLUCOSE 165* 130*  BUN 21 18  CREATININE 0.93 0.88  CALCIUM 9.6 9.1   Liver Function Tests: No results for input(s): "AST", "ALT", "ALKPHOS", "BILITOT", "PROT", "ALBUMIN" in the last 168 hours. CBG: No results for input(s): "GLUCAP" in the last 168 hours.  Discharge time spent: greater than 30 minutes.  Signed: Marrion Coy, MD [facility] 10/29/2022

## 2022-10-29 NOTE — Assessment & Plan Note (Signed)
-   We will place the patient on IV Rocephin and follow urine and blood cultures.

## 2022-10-29 NOTE — Assessment & Plan Note (Signed)
-   Neurosurgery consult to be obtained. - I notified Dr. Katrinka Blazing about the patient.

## 2022-10-29 NOTE — Assessment & Plan Note (Signed)
-   This is of new onset with associated bilateral, left more than right subjective lower extremity weakness. - The patient be admitted to the medical observation bed. - The pain MRI came back negative for CVA. - We will continue to follow neurochecks every 4 hours for 24 hours.

## 2022-10-29 NOTE — H&P (Signed)
Laura [name]   PATIENT NAME: Laura [name]    MR#:  [id number]  DATE OF BIRTH:  [birth year]  DATE OF ADMISSION:  10/28/2022  PRIMARY CARE PHYSICIAN: Lynnea Ferrier, MD   Patient is coming from: Home  REQUESTING/REFERRING PHYSICIAN: Alfonse Flavors, MD  CHIEF COMPLAINT:   Chief Complaint  Patient presents with   Encopresis   Altered Mental Status    HISTORY OF PRESENT ILLNESS:  Laura [name] is a 77 y.o. Caucasian female with medical history significant for asthma, type 2 diabetes mellitus anxiety, osteoarthritis, depression, hypertension, and seizure disorder as well as hypothyroidism, who presented to the emergency room  with acute onset of new onset stool incontinence started on Wednesday.  The patient has been having left more than right leg weakness.  She has urinary continence at baseline.  She admits to headache without dizziness or blurred vision.  No dysphagia or dysarthria.  No fever or chills.  No chest pain or palpitations.  No cough or wheezing or dyspnea.  She has mild diminished sensation on the left leg. ED Course: When she came to the ER, BP was 131/81 with otherwise normal vital signs.  Labs revealed blood glucose of 165 with otherwise normal BMP and CBC.  UA came back positive for UTI.  EKG as reviewed by me : EKG showed normal sinus rhythm with rate of 69 with nonspecific T wave abnormality. Imaging: Brain MRI without contrast revealed no acute intracranial normalities.  Lumbar spine MRI without contrast revealed the following: 1. L3-L4 severe spinal canal stenosis and mild bilateral neural foraminal narrowing, unchanged. Effacement of the lateral recesses at this level likely compresses the descending L4 nerve roots. 2. L2-L3 moderate spinal canal stenosis and mild bilateral neural foraminal narrowing, unchanged. Narrowing of the lateral recesses at this level could affect the descending L3 nerve roots. 3. L4-L5 mild to moderate spinal canal  stenosis, unchanged. Effacement of the lateral recesses at this level likely affects the descending L5 nerve roots. 4. L1-L2 mild spinal canal stenosis, unchanged. Narrowing of the right lateral recess at this level could affect the descending right L2 nerve roots. 5. Multilevel facet arthropathy, which is severe at L3-L4 and L4-L5, which can be a cause of back pain.  The patient will be admitted to an observation medical telemetry bed for further evaluation and management.  PAST MEDICAL HISTORY:   Past Medical History:  Diagnosis Date   Allergy    Anxiety    Arthritis, degenerative 10/08/2013   Overview:    a.  Lumbar spine/spinal stenosis/foot drop.   b.  Hands.    Asthma    Chronic hand pain, left 10/10/2019   Chronic kidney disease    Depression    Hypertension    Lumbar spinal stenosis with neurogenic claudication 11/07/2014   Major depression, single episode, in complete remission (HCC) 06/25/2015   Memory loss, short term 03/19/2014   Rheumatoid arthritis (HCC)    Sciatica of left side 12/31/2019   Seizure (HCC) 10/07/2014   Sleep apnea    Thyroid disease    -Type 2 diabetes mellitus PAST SURGICAL HISTORY:   Past Surgical History:  Procedure Laterality Date   ABDOMINAL HYSTERECTOMY     CESAREAN SECTION     HIP SURGERY Right    ORIF PELVIC FRACTURE WITH PERCUTANEOUS SCREWS Bilateral 02/02/2022   Procedure: ORIF PELVIC FRACTURE WITH PERCUTANEOUS SCREWS;  Surgeon: Roby Lofts, MD;  Location: [facility] OR;  Service: Orthopedics;  Laterality: Bilateral;  REPLACEMENT TOTAL KNEE Left     SOCIAL HISTORY:   Social History   Tobacco Use   Smoking status: Never   Smokeless tobacco: Never  Substance Use Topics   Alcohol use: No    Alcohol/week: 0.0 standard drinks of alcohol    FAMILY HISTORY:   Family History  Problem Relation Age of Onset   Hypertension Mother    Stroke Mother    Heart attack Father    Alcohol abuse Father    Depression Father     Post-traumatic stress disorder Father    Rheum arthritis Sister    Hypertension Sister    Depression Sister    Breast cancer Neg Hx     DRUG ALLERGIES:   Allergies  Allergen Reactions   Bupropion Other (See Comments)    Siezures   Jardiance [Empagliflozin] Other (See Comments)    Projectile vomiting   Penicillins Nausea And Vomiting and Rash    REVIEW OF SYSTEMS:   ROS As per history of present illness. All pertinent systems were reviewed above. Constitutional, HEENT, cardiovascular, respiratory, GI, GU, musculoskeletal, neuro, psychiatric, endocrine, integumentary and hematologic systems were reviewed and are otherwise negative/unremarkable except for positive findings mentioned above in the HPI.   MEDICATIONS AT HOME:   Prior to Admission medications   Medication Sig Start Date End Date Taking? Authorizing Provider  albuterol (VENTOLIN HFA) 108 (90 Base) MCG/ACT inhaler Inhale 2 puffs into the lungs every 6 (six) hours as needed for wheezing or shortness of breath. 04/30/19   Johnna Acosta, NP  aspirin EC 325 MG tablet Take 325 mg by mouth daily.    [provider]  calcium carbonate (TUMS - DOSED IN MG ELEMENTAL CALCIUM) 500 MG chewable tablet Chew 1 tablet by mouth daily.    [provider]  Calcium-Magnesium-Vitamin D (CALCIUM 1200+D3 PO) Take by mouth. 2 tablets daily    [provider]  Cholecalciferol (VITAMIN D3 PO) Take by mouth daily. With Mg    [provider]  docusate sodium (COLACE) 100 MG capsule Take 100 mg by mouth. Tuesday and Thursday    [provider]  doxepin (SINEQUAN) 10 MG capsule Take 20 mg by mouth at bedtime.    [provider]  DULoxetine (CYMBALTA) 20 MG capsule TAKE 1 CAPSULE (20 MG TOTAL) BY MOUTH DAILY. TAKE ALONG WITH 60 MG DAILY. 08/29/22   Jomarie Longs, MD  DULoxetine (CYMBALTA) 60 MG capsule TAKE 1 CAPSULE BY MOUTH EVERY DAY 08/29/22   Jomarie Longs, MD  esomeprazole (NEXIUM) 20 MG  capsule Take 20 mg by mouth daily at 12 noon.    [provider]  folic acid (FOLVITE) 1 MG tablet Take 1 mg by mouth daily.    [provider]  furosemide (LASIX) 80 MG tablet Take 80 mg by mouth every Monday, Wednesday, and Friday. 09/16/21   [provider]  hydroxychloroquine (PLAQUENIL) 200 MG tablet Take 200 mg by mouth daily.    [provider]  inFLIXimab (REMICADE) 100 MG injection Inject 100 mg into the vein every 8 (eight) weeks.    [provider]  levothyroxine (SYNTHROID) 125 MCG tablet Take 250 mcg by mouth daily before breakfast.    [provider]  loratadine (CLARITIN) 10 MG tablet Take 10 mg by mouth daily.  04/05/14   [provider]  methotrexate (RHEUMATREX) 2.5 MG tablet Take 10 mg by mouth every Sunday. 03/15/16   [provider]  metoprolol succinate (TOPROL-XL) 100 MG 24 hr  tablet Take 1 tablet (100 mg total) by mouth daily. Take with or immediately following a meal. 10/10/18   Mayo, Allyn Kenner, MD  naloxone [facility]) nasal spray 4 mg/0.1 mL Place 1 spray into the nose as needed for up to 365 doses (for opioid-induced respiratory depresssion). In case of emergency (overdose), spray once into each nostril. If no response within 3 minutes, repeat application and call 911. 10/12/22 10/12/23  Delano Metz, MD  oxyCODONE (OXY IR/ROXICODONE) 5 MG immediate release tablet Take 1 tablet (5 mg total) by mouth every 6 (six) hours as needed for severe pain. Must last 30 days 10/18/22 11/17/22  Delano Metz, MD  oxyCODONE (OXY IR/ROXICODONE) 5 MG immediate release tablet Take 1 tablet (5 mg total) by mouth every 6 (six) hours as needed for severe pain. Must last 30 days 11/17/22 12/17/22  Delano Metz, MD  oxyCODONE (OXY IR/ROXICODONE) 5 MG immediate release tablet Take 1 tablet (5 mg total) by mouth every 6 (six) hours as needed for severe pain. Must last 30 days 12/17/22 01/16/23  Delano Metz, MD  potassium  chloride (KLOR-CON) 10 MEQ tablet Take 10 mEq by mouth every Monday, Wednesday, and Friday.    [provider]  rosuvastatin (CRESTOR) 5 MG tablet Take 5 mg by mouth See admin instructions. On FRI and SUN 12/23/18   [provider]      VITAL SIGNS:  Blood pressure (!) 133/104, pulse 64, temperature 98.4 F (36.9 C), temperature source Oral, resp. rate (!) 22, height 5\' 6"  (1.676 m), weight 120.2 kg, SpO2 100%.  PHYSICAL EXAMINATION:  Physical Exam  GENERAL:  77 y.o.-year-old Caucasian female patient lying in the bed with no acute distress.  EYES: Pupils equal, round, reactive to light and accommodation. No scleral icterus. Extraocular muscles intact.  HEENT: Head atraumatic, normocephalic. Oropharynx and nasopharynx clear.  NECK:  Supple, no jugular venous distention. No thyroid enlargement, no tenderness.  LUNGS: Normal breath sounds bilaterally, no wheezing, rales,rhonchi or crepitation. No use of accessory muscles of respiration.  CARDIOVASCULAR: Regular rate and rhythm, S1, S2 normal. No murmurs, rubs, or gallops.  ABDOMEN: Soft, nondistended, nontender. Bowel sounds present. No organomegaly or mass.  EXTREMITIES: No pedal edema, cyanosis, or clubbing.  NEUROLOGIC: Cranial nerves II through XII are intact. Muscle strength 5/5 in all extremities. Sensation intact. Gait not checked.  PSYCHIATRIC: The patient is alert and oriented x 3.  Normal affect and good eye contact. SKIN: No obvious rash, lesion, or ulcer.   LABORATORY PANEL:   CBC Recent Labs  Lab 10/28/22 1348  WBC 6.2  HGB 12.4  HCT 37.7  PLT 247   ------------------------------------------------------------------------------------------------------------------  Chemistries  Recent Labs  Lab 10/28/22 1348  NA 139  K 3.7  CL 100  CO2 27  GLUCOSE 165*  BUN 21  CREATININE 0.93  CALCIUM 9.6    ------------------------------------------------------------------------------------------------------------------  Cardiac Enzymes No results for input(s): "TROPONINI" in the last 168 hours. ------------------------------------------------------------------------------------------------------------------  RADIOLOGY:  MR LUMBAR SPINE WO CONTRAST  Result Date: 10/28/2022 CLINICAL DATA:  Low back pain, incontinence, cauda equina syndrome suspected EXAM: MRI LUMBAR SPINE WITHOUT CONTRAST TECHNIQUE: Multiplanar, multisequence MR imaging of the lumbar spine was performed. No intravenous contrast was administered. COMPARISON:  04/29/2021 FINDINGS: Segmentation:  5 lumbar type vertebral bodies. Alignment:  Trace anterolisthesis of L3 on L4 and L4 on L5. Vertebrae: No acute fracture, evidence of discitis, or suspicious osseous lesion. Interval sacroiliac fixation. Conus medullaris and cauda equina: Conus extends to the T12-L1 level. Again noted is cauda  equina redundancy secondary to severe spinal canal stenosis at L3-L4. Paraspinal and other soft tissues: Atrophy of the posterior paraspinous muscles. Disc levels: T12-L1: No significant disc bulge. No spinal canal stenosis or neural foraminal narrowing. L1-L2: Mild disc bulge with new right subarticular disc protrusion, which narrows the right lateral recess. Mild facet arthropathy. Unchanged mild spinal canal stenosis. No neural foraminal narrowing. L2-L3: Minimal disc bulge. Moderate facet arthropathy. Ligamentum flavum hypertrophy. Narrowing of the lateral recesses. Moderate spinal canal stenosis and mild bilateral neural foraminal narrowing, unchanged. L3-L4: Trace anterolisthesis with disc unroofing and mild disc bulge. Severe facet arthropathy. Ligamentum flavum hypertrophy. Effacement of the lateral recesses. Severe spinal canal stenosis and mild bilateral neural foraminal narrowing, unchanged from the prior exam. L4-L5: Trace anterolisthesis and disc  unroofing with mild disc bulge. Severe facet arthropathy. Ligamentum flavum hypertrophy. Effacement of the lateral recesses. Mild to moderate spinal canal stenosis, unchanged. No significant neural foraminal narrowing. L5-S1: Evaluation is somewhat limited by susceptibility artifact from the patient's sacroiliac screw. Within this limitation, mild disc bulge. Moderate facet arthropathy. No spinal canal stenosis. Mild left neural foraminal narrowing, unchanged. IMPRESSION: 1. L3-L4 severe spinal canal stenosis and mild bilateral neural foraminal narrowing, unchanged. Effacement of the lateral recesses at this level likely compresses the descending L4 nerve roots. 2. L2-L3 moderate spinal canal stenosis and mild bilateral neural foraminal narrowing, unchanged. Narrowing of the lateral recesses at this level could affect the descending L3 nerve roots. 3. L4-L5 mild to moderate spinal canal stenosis, unchanged. Effacement of the lateral recesses at this level likely affects the descending L5 nerve roots. 4. L1-L2 mild spinal canal stenosis, unchanged. Narrowing of the right lateral recess at this level could affect the descending right L2 nerve roots. 5. Multilevel facet arthropathy, which is severe at L3-L4 and L4-L5, which can be a cause of back pain. Electronically Signed   By: Wiliam Ke M.D.   On: 10/28/2022 21:28   MR BRAIN WO CONTRAST  Result Date: 10/28/2022 CLINICAL DATA:  Altered mental status EXAM: MRI HEAD WITHOUT CONTRAST TECHNIQUE: Multiplanar, multiecho pulse sequences of the brain and surrounding structures were obtained without intravenous contrast. COMPARISON:  01/16/2020 MRI head FINDINGS: Brain: No restricted diffusion to suggest acute or subacute infarct. No acute hemorrhage, mass, mass effect, or midline shift. No hydrocephalus or extra-axial collection. Partial empty sella with slightly expanded sella. Normal craniocervical junction. No hemosiderin deposition to suggest remote hemorrhage.  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 T2 hyperintense signal in the periventricular white matter, likely the sequela of chronic small vessel ischemic disease. Vascular: Normal arterial flow voids. Skull and upper cervical spine: Normal marrow signal. Sinuses/Orbits: Clear paranasal sinuses. No acute finding in the orbits. Status post bilateral lens replacements. Other: Trace fluid in the left mastoid air cells. IMPRESSION: No acute intracranial process. No evidence of acute or subacute infarct. Electronically Signed   By: Wiliam Ke M.D.   On: 10/28/2022 21:18      IMPRESSION AND PLAN:  Assessment and Plan: * Stool incontinence - This is of new onset with associated bilateral, left more than right subjective lower extremity weakness. - The patient be admitted to the medical observation bed. - The pain MRI came back negative for CVA. - We will continue to follow neurochecks every 4 hours for 24 hours.   Lumbar spinal stenosis - Neurosurgery consult to be obtained. - I notified Dr. Katrinka Blazing about the patient.  Type 2 diabetes mellitus without complications (HCC) - The patient will be on  supplemental coverage with NovoLog.  Rheumatoid arthritis (HCC) - We will continue methotrexate and Plaquenil.  Acute lower UTI - We will place the patient on IV Rocephin and follow urine and blood cultures.  Hypothyroidism - We will continue Synthroid.       DVT prophylaxis: Lovenox.  Advanced Care Planning:  Code Status: full code.  Family Communication:  The plan of care was discussed in details with the patient (and family). I answered all questions. The patient agreed to proceed with the above mentioned plan. Further management will depend upon hospital course. Disposition Plan: Back to previous home environment Consults called: none.  All the records are reviewed and case discussed with ED provider.  Status is: Observation   I certify that at the time of admission, it is  my clinical judgment that the patient will requirehospital care extending less than 2 midnights.                            Dispo: The patient is from: Home              Anticipated d/c is to: Home              Patient currently is not medically stable to d/c.              Difficult to place patient: No  Hannah Beat M.D on 10/29/2022 at 6:23 AM  [facility]   From 7 PM-7 AM, contact night-coverage [email or web address]  CC: Primary care physician; Lynnea Ferrier, MD

## 2022-10-29 NOTE — Progress Notes (Signed)
PHARMACIST - PHYSICIAN COMMUNICATION  CONCERNING:  Enoxaparin (Lovenox) for DVT Prophylaxis    RECOMMENDATION: Patient was prescribed enoxaprin 40mg  q24 hours for VTE prophylaxis.   Filed Weights   10/28/22 1315  Weight: 120.2 kg (265 lb)    Body mass index is 42.77 kg/m.  Estimated Creatinine Clearance: 65.9 mL/min (by C-G formula based on SCr of 0.93 mg/dL).   Based on [facility] policy patient is candidate for enoxaparin 0.5mg /kg TBW SQ every 24 hours based on BMI being >30.  DESCRIPTION: Pharmacy has adjusted enoxaparin dose per [facility] policy.  Patient is now receiving enoxaparin 0.5 mg/kg every 24 hours   Otelia Sergeant, PharmD, MBA 10/29/2022 2:00 AM

## 2022-10-29 NOTE — Consult Note (Signed)
Referring Physician:  No referring provider defined for this encounter.  Primary Physician:  Lynnea Ferrier, MD  History of Present Illness: 10/29/2022 Laura [name] is here today with a chief complaint of 3 times bowel incontinence 3 weeks ago.  She states that she was in her usual state of health, currently planned for a lumbar laminectomy with Dr. Myer Haff.  One of the come in for evaluation given her bowel incontinence.  She has longstanding bladder incontinence and bilateral lower extremity weakness.  She states that approximately 3 weeks ago and single days she had 3 episodes of bowel incontinence, she could feel it coming, it was loose and she was not able to hold it.  She states that since then she has been able to return to her normal bowel habits.  She wanted to be checked for any worsening.  MRI was obtained and neurosurgery was consulted.  She has not noticed any worsening weakness numbness or tingling.  Review of Systems:  A 10 point review of systems is negative, except for the pertinent positives and negatives detailed in the HPI.  Past Medical History: Past Medical History:  Diagnosis Date   Allergy    Anxiety    Arthritis, degenerative 10/08/2013   Overview:    a.  Lumbar spine/spinal stenosis/foot drop.   b.  Hands.    Asthma    Chronic hand pain, left 10/10/2019   Chronic kidney disease    Depression    Hypertension    Lumbar spinal stenosis with neurogenic claudication 11/07/2014   Major depression, single episode, in complete remission (HCC) 06/25/2015   Memory loss, short term 03/19/2014   Rheumatoid arthritis (HCC)    Sciatica of left side 12/31/2019   Seizure (HCC) 10/07/2014   Sleep apnea    Thyroid disease     Past Surgical History: Past Surgical History:  Procedure Laterality Date   ABDOMINAL HYSTERECTOMY     CESAREAN SECTION     HIP SURGERY Right    ORIF PELVIC FRACTURE WITH PERCUTANEOUS SCREWS Bilateral 02/02/2022    Procedure: ORIF PELVIC FRACTURE WITH PERCUTANEOUS SCREWS;  Surgeon: Roby Lofts, MD;  Location: [facility] OR;  Service: Orthopedics;  Laterality: Bilateral;   REPLACEMENT TOTAL KNEE Left     Allergies: Allergies as of 10/28/2022 - Review Complete 10/28/2022  Allergen Reaction Noted   Bupropion Other (See Comments) 02/01/2022   Jardiance [empagliflozin] Other (See Comments) 01/14/2021   Penicillins Nausea And Vomiting and Rash 10/07/2014    Medications:  Current Facility-Administered Medications:    acetaminophen (TYLENOL) tablet 650 mg, 650 mg, Oral, Q6H PRN **OR** acetaminophen (TYLENOL) suppository 650 mg, 650 mg, Rectal, Q6H PRN, Mansy, Jan A, MD   albuterol (PROVENTIL) (2.5 MG/3ML) 0.083% nebulizer solution 2.5 mg, 2.5 mg, Nebulization, Q6H PRN, Otelia Sergeant, RPH   calcium carbonate (TUMS - dosed in mg elemental calcium) chewable tablet 200 mg of elemental calcium, 1 tablet, Oral, Daily, Mansy, Jan A, MD, 200 mg of elemental calcium at 10/29/22 2952   cefTRIAXone (ROCEPHIN) 2 g in sodium chloride 0.9 % 100 mL IVPB, 2 g, Intravenous, Q24H, Mansy, Vernetta Honey, MD, Stopped at 10/29/22 0857   cholecalciferol (VITAMIN D3) 25 MCG (1000 UNIT) tablet 1,000 Units, 1,000 Units, Oral, Daily, Mansy, Jan A, MD, 1,000 Units at 10/29/22 8413   doxepin (SINEQUAN) capsule 20 mg, 20 mg, Oral, QHS, Mansy, Jan A, MD   DULoxetine (CYMBALTA) DR capsule 60 mg, 60 mg, Oral, Daily, Mansy, Vernetta Honey, MD, [phone]  mg at 10/29/22 0927   enoxaparin (LOVENOX) injection 60 mg, 60 mg, Subcutaneous, Q24H, Mansy, Jan A, MD, 60 mg at 10/29/22 4696   folic acid (FOLVITE) tablet 1 mg, 1 mg, Oral, Daily, Mansy, Jan A, MD, 1 mg at 10/29/22 0927   [START ON 10/31/2022] furosemide (LASIX) tablet 80 mg, 80 mg, Oral, Q M,W,F, Mansy, Jan A, MD   levothyroxine (SYNTHROID) tablet 250 mcg, 250 mcg, Oral, Q0600, Mansy, Jan A, MD, 250 mcg at 10/29/22 0548   loratadine (CLARITIN) tablet 10 mg, 10 mg, Oral, Daily, Mansy, Jan A, MD, 10 mg at 10/29/22  0927   [START ON 10/30/2022] methotrexate (RHEUMATREX) tablet 10 mg, 10 mg, Oral, Q Sun, Mansy, Jan A, MD   metoprolol succinate (TOPROL-XL) 24 hr tablet 100 mg, 100 mg, Oral, Daily, Mansy, Jan A, MD, 100 mg at 10/29/22 2952   naloxone (NARCAN) nasal spray 4 mg/0.1 mL, 1 spray, Nasal, PRN, Mansy, Jan A, MD   ondansetron (ZOFRAN) tablet 4 mg, 4 mg, Oral, Q6H PRN **OR** ondansetron (ZOFRAN) injection 4 mg, 4 mg, Intravenous, Q6H PRN, Mansy, Jan A, MD   oxyCODONE (Oxy IR/ROXICODONE) immediate release tablet 5 mg, 5 mg, Oral, Q6H PRN, Mansy, Jan A, MD   pantoprazole (PROTONIX) EC tablet 40 mg, 40 mg, Oral, Daily, Mansy, Jan A, MD, 40 mg at 10/29/22 0927   [START ON 10/31/2022] potassium chloride (KLOR-CON M) CR tablet 10 mEq, 10 mEq, Oral, Q M,W,F, Mansy, Jan A, MD   [START ON 10/30/2022] rosuvastatin (CRESTOR) tablet 5 mg, 5 mg, Oral, Once per day on Sunday Friday, Mansy, Jan A, MD   traZODone (DESYREL) tablet 25 mg, 25 mg, Oral, QHS PRN, Mansy, Jan A, MD  Current Outpatient Medications:    doxepin (SINEQUAN) 10 MG capsule, Take 20 mg by mouth at bedtime., Disp: , Rfl:    DULoxetine (CYMBALTA) 20 MG capsule, TAKE 1 CAPSULE (20 MG TOTAL) BY MOUTH DAILY. TAKE ALONG WITH 60 MG DAILY., Disp: 90 capsule, Rfl: 0   DULoxetine (CYMBALTA) 60 MG capsule, TAKE 1 CAPSULE BY MOUTH EVERY DAY, Disp: 90 capsule, Rfl: 0   furosemide (LASIX) 80 MG tablet, Take 80 mg by mouth daily., Disp: , Rfl:    hydroxychloroquine (PLAQUENIL) 200 MG tablet, Take 200 mg by mouth daily., Disp: , Rfl:    levothyroxine (SYNTHROID) 125 MCG tablet, Take 250 mcg by mouth daily before breakfast., Disp: , Rfl:    methotrexate (RHEUMATREX) 2.5 MG tablet, Take 10 mg by mouth every Sunday., Disp: , Rfl: 5   metoprolol succinate (TOPROL-XL) 100 MG 24 hr tablet, Take 1 tablet (100 mg total) by mouth daily. Take with or immediately following a meal., Disp: 30 tablet, Rfl: 0   naloxone (NARCAN) nasal spray 4 mg/0.1 mL, Place 1 spray into the nose  as needed for up to 365 doses (for opioid-induced respiratory depresssion). In case of emergency (overdose), spray once into each nostril. If no response within 3 minutes, repeat application and call 911., Disp: 1 each, Rfl: 0   oxyCODONE (OXY IR/ROXICODONE) 5 MG immediate release tablet, Take 1 tablet (5 mg total) by mouth every 6 (six) hours as needed for severe pain. Must last 30 days, Disp: 120 tablet, Rfl: 0   potassium chloride (KLOR-CON) 10 MEQ tablet, Take 10 mEq by mouth daily., Disp: , Rfl:    rosuvastatin (CRESTOR) 5 MG tablet, Take 5 mg by mouth See admin instructions. On FRI and SUN, Disp: , Rfl:    albuterol (VENTOLIN HFA) 108 (90  Base) MCG/ACT inhaler, Inhale 2 puffs into the lungs every 6 (six) hours as needed for wheezing or shortness of breath., Disp: 18 g, Rfl: 3   aspirin EC 325 MG tablet, Take 325 mg by mouth daily., Disp: , Rfl:    calcium carbonate (TUMS - DOSED IN MG ELEMENTAL CALCIUM) 500 MG chewable tablet, Chew 1 tablet by mouth daily., Disp: , Rfl:    Calcium-Magnesium-Vitamin D (CALCIUM 1200+D3 PO), Take by mouth. 2 tablets daily, Disp: , Rfl:    Cholecalciferol (VITAMIN D3 PO), Take by mouth daily. With Mg, Disp: , Rfl:    docusate sodium (COLACE) 100 MG capsule, Take 100 mg by mouth. Tuesday and Thursday, Disp: , Rfl:    esomeprazole (NEXIUM) 20 MG capsule, Take 20 mg by mouth daily at 12 noon., Disp: , Rfl:    folic acid (FOLVITE) 1 MG tablet, Take 1 mg by mouth daily., Disp: , Rfl:    inFLIXimab (REMICADE) 100 MG injection, Inject 100 mg into the vein every 8 (eight) weeks., Disp: , Rfl:    loratadine (CLARITIN) 10 MG tablet, Take 10 mg by mouth daily. , Disp: , Rfl:    [START ON 11/17/2022] oxyCODONE (OXY IR/ROXICODONE) 5 MG immediate release tablet, Take 1 tablet (5 mg total) by mouth every 6 (six) hours as needed for severe pain. Must last 30 days, Disp: 120 tablet, Rfl: 0   [START ON 12/17/2022] oxyCODONE (OXY IR/ROXICODONE) 5 MG immediate release tablet, Take 1  tablet (5 mg total) by mouth every 6 (six) hours as needed for severe pain. Must last 30 days, Disp: 120 tablet, Rfl: 0  Social History: Social History   Tobacco Use   Smoking status: Never   Smokeless tobacco: Never  Vaping Use   Vaping status: Never Used  Substance Use Topics   Alcohol use: No    Alcohol/week: 0.0 standard drinks of alcohol   Drug use: Yes    Comment: prescribed oxy    Family Medical History: Family History  Problem Relation Age of Onset   Hypertension Mother    Stroke Mother    Heart attack Father    Alcohol abuse Father    Depression Father    Post-traumatic stress disorder Father    Rheum arthritis Sister    Hypertension Sister    Depression Sister    Breast cancer Neg Hx     Physical Examination: Vitals:   10/29/22 0811 10/29/22 0923  BP: 136/83 (!) 124/101  Pulse: 87   Resp: 16 20  Temp: 98.3 F (36.8 C)   SpO2: 98%     General: Patient is in no apparent distress. Attention to examination is appropriate.  Neck:   Supple.  Full range of motion.  Respiratory: Patient is breathing without any difficulty.   NEUROLOGICAL:     Awake, alert, oriented to person, place, and time.  Speech is clear and fluent.   Cranial Nerves: Pupils equal round and reactive to light.  Facial tone is symmetric. Shoulder shrug is symmetric. Tongue protrusion is midline.  There is no pronator drift.  Motor Exam:  Continues to have severe bilateral lower extremity foot drop and plantarflexion weakness.  Continues to have mild weakness in her knee extension as well.  These do not appear significantly changed from her prior documentation.  \Sensation is at baseline  Continues to be incontinent of urine, now has control of her bowels.  Medical Decision Making  Imaging: Narrative & Impression  CLINICAL DATA:  Low back pain, incontinence, cauda  equina syndrome suspected   EXAM: MRI LUMBAR SPINE WITHOUT CONTRAST   TECHNIQUE: Multiplanar, multisequence MR  imaging of the lumbar spine was performed. No intravenous contrast was administered.   COMPARISON:  04/29/2021   FINDINGS: Segmentation:  5 lumbar type vertebral bodies.   Alignment:  Trace anterolisthesis of L3 on L4 and L4 on L5.   Vertebrae: No acute fracture, evidence of discitis, or suspicious osseous lesion. Interval sacroiliac fixation.   Conus medullaris and cauda equina: Conus extends to the T12-L1 level. Again noted is cauda equina redundancy secondary to severe spinal canal stenosis at L3-L4.   Paraspinal and other soft tissues: Atrophy of the posterior paraspinous muscles.   Disc levels:   T12-L1: No significant disc bulge. No spinal canal stenosis or neural foraminal narrowing.   L1-L2: Mild disc bulge with new right subarticular disc protrusion, which narrows the right lateral recess. Mild facet arthropathy. Unchanged mild spinal canal stenosis. No neural foraminal narrowing.   L2-L3: Minimal disc bulge. Moderate facet arthropathy. Ligamentum flavum hypertrophy. Narrowing of the lateral recesses. Moderate spinal canal stenosis and mild bilateral neural foraminal narrowing, unchanged.   L3-L4: Trace anterolisthesis with disc unroofing and mild disc bulge. Severe facet arthropathy. Ligamentum flavum hypertrophy. Effacement of the lateral recesses. Severe spinal canal stenosis and mild bilateral neural foraminal narrowing, unchanged from the prior exam.   L4-L5: Trace anterolisthesis and disc unroofing with mild disc bulge. Severe facet arthropathy. Ligamentum flavum hypertrophy. Effacement of the lateral recesses. Mild to moderate spinal canal stenosis, unchanged. No significant neural foraminal narrowing.   L5-S1: Evaluation is somewhat limited by susceptibility artifact from the patient's sacroiliac screw. Within this limitation, mild disc bulge. Moderate facet arthropathy. No spinal canal stenosis. Mild left neural foraminal narrowing, unchanged.    IMPRESSION: 1. L3-L4 severe spinal canal stenosis and mild bilateral neural foraminal narrowing, unchanged. Effacement of the lateral recesses at this level likely compresses the descending L4 nerve roots. 2. L2-L3 moderate spinal canal stenosis and mild bilateral neural foraminal narrowing, unchanged. Narrowing of the lateral recesses at this level could affect the descending L3 nerve roots. 3. L4-L5 mild to moderate spinal canal stenosis, unchanged. Effacement of the lateral recesses at this level likely affects the descending L5 nerve roots. 4. L1-L2 mild spinal canal stenosis, unchanged. Narrowing of the right lateral recess at this level could affect the descending right L2 nerve roots. 5. Multilevel facet arthropathy, which is severe at L3-L4 and L4-L5, which can be a cause of back pain.     Electronically Signed   By: Wiliam Ke M.D.   On: 10/28/2022 21:28   I have personally reviewed the images and electrodiagnostics and agree with the above interpretation.  Assessment and Plan: Laura [name] is a pleasant 77 y.o. female with lumbar stenosis currently plan for lumbar laminectomy.  She has a multiyear history of bladder incontinence with bilateral lower extremity foot drop and plantarflexion weakness.  She has previously refused surgical decompression but decided on undergoing a decompression here in the short future.  She comes in today because approximately 3 weeks ago had 3 episodes of bowel incontinence and was unsure whether or not this meant she was getting worse.  She got an MRI which demonstrated stable changes with no worsening of her severe stenosis.  On physical examination she is stable.  At this point I do not think she needs a urgent surgical intervention as she has regained her bowel continence, and her only incontinent issues were when she had loose stools when  she almost always has formed hard stools.  She will continue to follow-up as scheduled.  If she has any  further concerns let her know I was on-call weekend and available for discussion.   Thank you for involving me in the care of this patient.    Lovenia Kim MD/MSCR Neurosurgery

## 2022-10-31 ENCOUNTER — Telehealth: Payer: Self-pay | Admitting: Neurosurgery

## 2022-10-31 LAB — URINE CULTURE: Culture: >=100000 — AB

## 2022-10-31 NOTE — Telephone Encounter (Signed)
Patient's daughter, Marisue Ivan is calling to let our office know that the patient was discharged on Saturday, but that she is still having fecal incontinence. The daughter states that the patient is not eating much because she does not want to have an accident. The patient's daughter also states that the patient says the fecal incontinence has been going on for three weeks, but the daughter thinks it has only been going on for 4-5 days. The daughter is requesting a call back with what she needs to do.

## 2022-11-01 ENCOUNTER — Telehealth: Payer: Self-pay

## 2022-11-01 ENCOUNTER — Encounter: Payer: Self-pay | Admitting: Neurosurgery

## 2022-11-01 NOTE — Telephone Encounter (Signed)
Surgery has been rescheduled to 11/14/22 per daughter's request.

## 2022-11-02 ENCOUNTER — Telehealth: Payer: Self-pay | Admitting: Psychiatry

## 2022-11-02 ENCOUNTER — Encounter: Payer: Medicare Other | Admitting: Urology

## 2022-11-02 ENCOUNTER — Other Ambulatory Visit: Payer: PRIVATE HEALTH INSURANCE

## 2022-11-02 NOTE — Telephone Encounter (Signed)
Noted  

## 2022-11-02 NOTE — Telephone Encounter (Signed)
Daughter called to cancel appt for 11/22/22. Patient having back surgery 11/14/22. Not sure if she will be able to go back home or to rehab facility. She cannot even get her in a car to bring her to a sooner appointment. Wanted you to be aware.

## 2022-11-04 LAB — CULTURE, BLOOD (ROUTINE X 2)
Culture: NO GROWTH
Special Requests: ADEQUATE

## 2022-11-08 ENCOUNTER — Other Ambulatory Visit: Payer: Self-pay

## 2022-11-08 ENCOUNTER — Encounter
Admission: RE | Admit: 2022-11-08 | Discharge: 2022-11-08 | Disposition: A | Discharge status: Home / Self Care | Payer: Medicare Other | Source: Ambulatory Visit | Attending: Neurosurgery | Admitting: Neurosurgery

## 2022-11-08 ENCOUNTER — Encounter (HOSPITAL_COMMUNITY): Payer: Self-pay | Admitting: Urgent Care

## 2022-11-08 DIAGNOSIS — Z01812 Encounter for preprocedural laboratory examination: Secondary | ICD-10-CM | POA: Diagnosis present

## 2022-11-08 HISTORY — DX: Atherosclerotic heart disease of native coronary artery without angina pectoris: I25.10

## 2022-11-08 HISTORY — DX: Type 2 diabetes mellitus without complications: E11.9

## 2022-11-08 HISTORY — DX: Hypothyroidism, unspecified: E03.9

## 2022-11-08 HISTORY — DX: Paroxysmal atrial fibrillation: I48.0

## 2022-11-08 HISTORY — DX: Pneumonia, unspecified organism: J18.9

## 2022-11-08 HISTORY — DX: Methicillin resistant Staphylococcus aureus infection, unspecified site: A49.02

## 2022-11-08 HISTORY — DX: Chronic kidney disease, stage 3 unspecified: N18.30

## 2022-11-08 HISTORY — DX: Mixed hyperlipidemia: E78.2

## 2022-11-08 HISTORY — DX: Heberden's nodes (with arthropathy): M15.1

## 2022-11-08 HISTORY — DX: Unspecified urinary incontinence: R32

## 2022-11-08 HISTORY — DX: Unspecified osteoarthritis, unspecified site: M19.90

## 2022-11-08 HISTORY — DX: Essential (primary) hypertension: I10

## 2022-11-08 LAB — SURGICAL PCR SCREEN
MRSA, PCR: NEGATIVE
Staphylococcus aureus: NEGATIVE

## 2022-11-08 LAB — TYPE AND SCREEN
ABO/RH(D): A POS
Antibody Screen: NEGATIVE

## 2022-11-08 NOTE — Telephone Encounter (Signed)
Per discussion with Dr Myer Haff, she needs to hold aspirin 325mg , but it is OK to take aspirin 81mg . I have notified pt's daughter, Marisue Ivan, via mychart.

## 2022-11-08 NOTE — Patient Instructions (Addendum)
Your procedure is scheduled on: Monday, November 4 Report to the Registration Desk on the 1st floor of the [facility] To find out your arrival time, please call [phone] between 1PM - 3PM on: Friday, November 1 If your arrival time is 6:00 am, do not arrive before that time as the [facility] entrance doors do not open until 6:00 am.  REMEMBER: Instructions that are not followed completely may result in serious medical risk, up to and including death; or upon the discretion of your surgeon and anesthesiologist your surgery may need to be rescheduled.  Do not eat food after midnight the night before surgery.  No gum chewing or hard candies.  You may however, drink water up to 2 hours before you are scheduled to arrive for your surgery. Do not drink anything within 2 hours of your scheduled arrival time.  One week prior to surgery: starting today, October 29 Stop Anti-inflammatories (NSAIDS) such as Advil, Aleve, Ibuprofen, Motrin, Naproxen, Naprosyn and Aspirin based products such as Excedrin, Goody's Powder, BC Powder. Stop ANY OVER THE COUNTER supplements until after surgery. Stop tums, calcium, vitamin D  You may however, continue to take Tylenol if needed for pain up until the day of surgery.  Per Dr. Lucienne Capers instructions: Aspirin 325mg :  stop aspirin 7 days prior, resume aspirin 14 days after; OK to take aspirin 81mg  Remicade: hold 4 weeks prior and 4 weeks after surgery   Continue taking all of your other prescription medications up until the day of surgery.  ON THE DAY OF SURGERY ONLY TAKE THESE MEDICATIONS WITH SIPS OF WATER:  Duloxetine (Cymbalta) Esomeprazole (Nexium) Levothyroxine Metoprolol Potassium  Use albuterol inhaler on the day of surgery and bring to the hospital.  No Alcohol for 24 hours before or after surgery.  No Smoking including e-cigarettes for 24 hours before surgery.  No chewable tobacco products for at least 6 hours before surgery.  No  nicotine patches on the day of surgery.  Do not use any "recreational" drugs for at least a week (preferably 2 weeks) before your surgery.  Please be advised that the combination of cocaine and anesthesia may have negative outcomes, up to and including death. If you test positive for cocaine, your surgery will be cancelled.  On the morning of surgery brush your teeth with toothpaste and water, you may rinse your mouth with mouthwash if you wish. Do not swallow any toothpaste or mouthwash.  Use CHG Soap as directed on instruction sheet.  Do not wear jewelry, make-up, hairpins, clips or nail polish.  For welded (permanent) jewelry: bracelets, anklets, waist bands, etc.  Please have this removed prior to surgery.  If it is not removed, there is a chance that hospital personnel will need to cut it off on the day of surgery.  Do not wear lotions, powders, or perfumes.   Do not shave body hair from the neck down 48 hours before surgery.  Contact lenses, hearing aids and dentures may not be worn into surgery.  Do not bring valuables to the hospital. [facility] is not responsible for any missing/lost belongings or valuables.   Notify your doctor if there is any change in your medical condition (cold, fever, infection).  Wear comfortable clothing (specific to your surgery type) to the hospital.  After surgery, you can help prevent lung complications by doing breathing exercises.  Take deep breaths and cough every 1-2 hours. Your doctor may order a device called an Incentive Spirometer to help you take  deep breaths.  If you are being admitted to the hospital overnight, leave your suitcase in the car. After surgery it may be brought to your room.  In case of increased patient census, it may be necessary for you, the patient, to continue your postoperative care in the Same Day Surgery department.  If you are being discharged the day of surgery, you will not be allowed to drive home. You will  need a responsible individual to drive you home and stay with you for 24 hours after surgery.   If you are taking public transportation, you will need to have a responsible individual with you.  Please call the Pre-admissions Testing Dept. at [phone] if you have any questions about these instructions.  Surgery Visitation Policy:  Patients having surgery or a procedure may have two visitors.  Children under the age of 26 must have an adult with them who is not the patient.  Inpatient Visitation:    Visiting hours are 7 a.m. to 8 p.m. Up to four visitors are allowed at one time in a patient room. The visitors may rotate out with other people during the day.  One visitor age 64 or older may stay with the patient overnight and must be in the room by 8 p.m.     Pre-operative 5 CHG Bath Instructions   You can play a key role in reducing the risk of infection after surgery. Your skin needs to be as free of germs as possible. You can reduce the number of germs on your skin by washing with CHG (chlorhexidine gluconate) soap before surgery. CHG is an antiseptic soap that kills germs and continues to kill germs even after washing.   DO NOT use if you have an allergy to chlorhexidine/CHG or antibacterial soaps. If your skin becomes reddened or irritated, stop using the CHG and notify one of our RNs at [phone].   Please shower with the CHG soap starting 4 days before surgery using the following schedule:     Please keep in mind the following:  DO NOT shave, including legs and underarms, starting the day of your first shower.   You may shave your face at any point before/day of surgery.  Place clean sheets on your bed the day you start using CHG soap. Use a clean washcloth (not used since being washed) for each shower. DO NOT sleep with pets once you start using the CHG.   CHG Shower Instructions:  If you choose to wash your hair and private area, wash first with your normal  shampoo/soap.  After you use shampoo/soap, rinse your hair and body thoroughly to remove shampoo/soap residue.  Turn the water OFF and apply about 3 tablespoons (45 ml) of CHG soap to a CLEAN washcloth.  Apply CHG soap ONLY FROM YOUR NECK DOWN TO YOUR TOES (washing for 3-5 minutes)  DO NOT use CHG soap on face, private areas, open wounds, or sores.  Pay special attention to the area where your surgery is being performed.  If you are having back surgery, having someone wash your back for you may be helpful. Wait 2 minutes after CHG soap is applied, then you may rinse off the CHG soap.  Pat dry with a clean towel  Put on clean clothes/pajamas   If you choose to wear lotion, please use ONLY the CHG-compatible lotions on the back of this paper.     Additional instructions for the day of surgery: DO NOT APPLY any lotions, deodorants, cologne,  or perfumes.   Put on clean/comfortable clothes.  Brush your teeth.  Ask your nurse before applying any prescription medications to the skin.      CHG Compatible Lotions   Aveeno Moisturizing lotion  Cetaphil Moisturizing Cream  Cetaphil Moisturizing Lotion  Clairol Herbal Essence Moisturizing Lotion, Dry Skin  Clairol Herbal Essence Moisturizing Lotion, Extra Dry Skin  Clairol Herbal Essence Moisturizing Lotion, Normal Skin  Curel Age Defying Therapeutic Moisturizing Lotion with Alpha Hydroxy  Curel Extreme Care Body Lotion  Curel Soothing Hands Moisturizing Hand Lotion  Curel Therapeutic Moisturizing Cream, Fragrance-Free  Curel Therapeutic Moisturizing Lotion, Fragrance-Free  Curel Therapeutic Moisturizing Lotion, Original Formula  Eucerin Daily Replenishing Lotion  Eucerin Dry Skin Therapy Plus Alpha Hydroxy Crme  Eucerin Dry Skin Therapy Plus Alpha Hydroxy Lotion  Eucerin Original Crme  Eucerin Original Lotion  Eucerin Plus Crme Eucerin Plus Lotion  Eucerin TriLipid Replenishing Lotion  Keri Anti-Bacterial Hand Lotion  Keri Deep  Conditioning Original Lotion Dry Skin Formula Softly Scented  Keri Deep Conditioning Original Lotion, Fragrance Free Sensitive Skin Formula  Keri Lotion Fast Absorbing Fragrance Free Sensitive Skin Formula  Keri Lotion Fast Absorbing Softly Scented Dry Skin Formula  Keri Original Lotion  Keri Skin Renewal Lotion Keri Silky Smooth Lotion  Keri Silky Smooth Sensitive Skin Lotion  Nivea Body Creamy Conditioning Oil  Nivea Body Extra Enriched Teacher, adult education Moisturizing Lotion Nivea Crme  Nivea Skin Firming Lotion  NutraDerm 30 Skin Lotion  NutraDerm Skin Lotion  NutraDerm Therapeutic Skin Cream  NutraDerm Therapeutic Skin Lotion  ProShield Protective Hand Cream  Provon moisturizing lotion

## 2022-11-09 ENCOUNTER — Telehealth: Payer: Self-pay

## 2022-11-09 NOTE — Telephone Encounter (Signed)
Dr Myer Haff, can you respond to her?

## 2022-11-09 NOTE — Telephone Encounter (Signed)
Patient's daughter left a voicemail and is requesting a call back at [phone].

## 2022-11-14 ENCOUNTER — Ambulatory Visit: Admit: 2022-11-14 | Payer: Medicare Other | Admitting: Neurosurgery

## 2022-11-14 DIAGNOSIS — E119 Type 2 diabetes mellitus without complications: Secondary | ICD-10-CM

## 2022-11-14 DIAGNOSIS — Z01812 Encounter for preprocedural laboratory examination: Secondary | ICD-10-CM

## 2022-11-14 SURGERY — LUMBAR LAMINECTOMY/DECOMPRESSION MICRODISCECTOMY 3 LEVELS
Anesthesia: General

## 2022-11-18 ENCOUNTER — Other Ambulatory Visit: Payer: PRIVATE HEALTH INSURANCE

## 2022-11-21 ENCOUNTER — Encounter: Payer: PRIVATE HEALTH INSURANCE | Admitting: Orthopedic Surgery

## 2022-11-22 ENCOUNTER — Ambulatory Visit: Payer: Medicare Other | Admitting: Psychiatry

## 2022-11-24 ENCOUNTER — Ambulatory Visit
Admission: RE | Admit: 2022-11-24 | Discharge: 2022-11-24 | Disposition: A | Discharge status: Home / Self Care | Payer: Medicare Other | Attending: Internal Medicine | Admitting: Internal Medicine

## 2022-11-24 ENCOUNTER — Other Ambulatory Visit: Payer: Self-pay

## 2022-11-24 ENCOUNTER — Encounter
Admission: RE | Disposition: A | Discharge status: Home / Self Care | Payer: Self-pay | Source: Home / Self Care | Attending: Internal Medicine

## 2022-11-24 ENCOUNTER — Encounter: Payer: Self-pay | Admitting: Internal Medicine

## 2022-11-24 DIAGNOSIS — R9430 Abnormal result of cardiovascular function study, unspecified: Secondary | ICD-10-CM

## 2022-11-24 DIAGNOSIS — I2510 Atherosclerotic heart disease of native coronary artery without angina pectoris: Secondary | ICD-10-CM | POA: Diagnosis not present

## 2022-11-24 HISTORY — PX: LEFT HEART CATH AND CORONARY ANGIOGRAPHY: CATH118249

## 2022-11-24 LAB — CARDIAC CATHETERIZATION: Cath EF Quantitative: 55 %

## 2022-11-24 SURGERY — LEFT HEART CATH AND CORONARY ANGIOGRAPHY
Anesthesia: Moderate Sedation | Laterality: Left

## 2022-11-24 MED ORDER — SODIUM CHLORIDE 0.9 % WEIGHT BASED INFUSION: 1.0000 mL/kg/h | INTRAVENOUS | Status: DC

## 2022-11-24 MED ORDER — VERAPAMIL HCL 2.5 MG/ML IV SOLN
INTRAVENOUS | Status: DC | PRN
  Administered 2022-11-24: 2.5 mg via INTRA_ARTERIAL

## 2022-11-24 MED ORDER — SODIUM CHLORIDE 0.9 % WEIGHT BASED INFUSION: 3.0000 mL/kg/h | INTRAVENOUS | Status: DC

## 2022-11-24 MED ORDER — FENTANYL CITRATE (PF) 100 MCG/2ML IJ SOLN
INTRAMUSCULAR | Status: AC
  Filled 2022-11-24: qty 2

## 2022-11-24 MED ORDER — HEPARIN SODIUM (PORCINE) 1000 UNIT/ML IJ SOLN
INTRAMUSCULAR | Status: AC
  Filled 2022-11-24: qty 10

## 2022-11-24 MED ORDER — SODIUM CHLORIDE 0.9 % WEIGHT BASED INFUSION
1.0000 mL/kg/h | INTRAVENOUS | Status: DC
Start: 2022-11-24 — End: 2022-11-24

## 2022-11-24 MED ORDER — ACETAMINOPHEN 325 MG PO TABS: 650.0000 mg | ORAL_TABLET | ORAL | Status: DC | PRN

## 2022-11-24 MED ORDER — SODIUM CHLORIDE 0.9 % IV SOLN
250.0000 mL | INTRAVENOUS | Status: DC | PRN
Start: 2022-11-24 — End: 2022-11-24

## 2022-11-24 MED ORDER — MIDAZOLAM HCL 2 MG/2ML IJ SOLN
INTRAMUSCULAR | Status: AC
  Filled 2022-11-24: qty 2

## 2022-11-24 MED ORDER — FENTANYL CITRATE (PF) 100 MCG/2ML IJ SOLN
INTRAMUSCULAR | Status: DC | PRN
  Administered 2022-11-24: 25 ug via INTRAVENOUS

## 2022-11-24 MED ORDER — SODIUM CHLORIDE 0.9% FLUSH
3.0000 mL | INTRAVENOUS | Status: DC | PRN
Start: 2022-11-24 — End: 2022-11-24

## 2022-11-24 MED ORDER — HEPARIN (PORCINE) IN NACL 2000-0.9 UNIT/L-% IV SOLN
INTRAVENOUS | Status: DC | PRN
  Administered 2022-11-24: 1000 mL

## 2022-11-24 MED ORDER — HEPARIN SODIUM (PORCINE) 1000 UNIT/ML IJ SOLN
INTRAMUSCULAR | Status: DC | PRN
  Administered 2022-11-24: 4000 [IU] via INTRAVENOUS

## 2022-11-24 MED ORDER — VERAPAMIL HCL 2.5 MG/ML IV SOLN
INTRAVENOUS | Status: AC
  Filled 2022-11-24: qty 2

## 2022-11-24 MED ORDER — MIDAZOLAM HCL 2 MG/2ML IJ SOLN
INTRAMUSCULAR | Status: DC | PRN
  Administered 2022-11-24: 1 mg via INTRAVENOUS

## 2022-11-24 MED ORDER — LABETALOL HCL 5 MG/ML IV SOLN: 10.0000 mg | INTRAVENOUS | Status: DC | PRN

## 2022-11-24 MED ORDER — SODIUM CHLORIDE 0.9% FLUSH
3.0000 mL | Freq: Two times a day (BID) | INTRAVENOUS | Status: DC
  Administered 2022-11-24: 3 mL via INTRAVENOUS

## 2022-11-24 MED ORDER — ASPIRIN 81 MG PO CHEW: 81.0000 mg | CHEWABLE_TABLET | ORAL | Status: DC

## 2022-11-24 MED ORDER — SODIUM CHLORIDE 0.9 % WEIGHT BASED INFUSION
3.0000 mL/kg/h | INTRAVENOUS | Status: AC
  Administered 2022-11-24: 3 mL/kg/h via INTRAVENOUS

## 2022-11-24 MED ORDER — IOHEXOL 300 MG/ML  SOLN
INTRAMUSCULAR | Status: DC | PRN
  Administered 2022-11-24: 203 mL

## 2022-11-24 SURGICAL SUPPLY — 13 items
CATH 5FR JL3.5 JR4 ANG PIG MP (CATHETERS) IMPLANT
CATH INFINITI 5 FR MPA2 (CATHETERS) IMPLANT
CATH INFINITI AMBI 5FR TG (CATHETERS) IMPLANT
DEVICE RAD TR BAND REGULAR (VASCULAR PRODUCTS) IMPLANT
DRAPE BRACHIAL (DRAPES) IMPLANT
GLIDESHEATH SLEND SS 6F .021 (SHEATH) IMPLANT
GUIDEWIRE INQWIRE 1.5J.035X260 (WIRE) IMPLANT
INQWIRE 1.5J .035X260CM (WIRE) ×1
PACK CARDIAC CATH (CUSTOM PROCEDURE TRAY) ×1 IMPLANT
PANNUS RETENTION SYSTEM 2 PAD (MISCELLANEOUS) IMPLANT
PROTECTION STATION PRESSURIZED (MISCELLANEOUS) ×1
SET ATX-X65L (MISCELLANEOUS) IMPLANT
STATION PROTECTION PRESSURIZED (MISCELLANEOUS) IMPLANT

## 2022-11-25 ENCOUNTER — Encounter: Payer: Self-pay | Admitting: Internal Medicine

## 2022-11-29 ENCOUNTER — Encounter: Payer: PRIVATE HEALTH INSURANCE | Admitting: Orthopedic Surgery

## 2022-11-29 DIAGNOSIS — R9430 Abnormal result of cardiovascular function study, unspecified: Secondary | ICD-10-CM

## 2022-12-13 ENCOUNTER — Encounter: Payer: PRIVATE HEALTH INSURANCE | Admitting: Orthopedic Surgery

## 2022-12-16 ENCOUNTER — Telehealth: Payer: Self-pay | Admitting: Pain Medicine

## 2022-12-16 NOTE — Telephone Encounter (Signed)
PT daughter called wanted to know had a refill been send in to pharmacy for oxycodone. Please give daughter a call. TY

## 2022-12-16 NOTE — Telephone Encounter (Signed)
Called patient. She does not have a refill ordered. Appt set for 12/30  Informed her that he would not do a refill without seeing the patient. She will need to call back to schedule an earlier appt.

## 2022-12-20 ENCOUNTER — Encounter: Payer: PRIVATE HEALTH INSURANCE | Admitting: Neurosurgery

## 2022-12-27 ENCOUNTER — Encounter: Payer: PRIVATE HEALTH INSURANCE | Admitting: Neurosurgery

## 2022-12-28 ENCOUNTER — Encounter: Payer: Self-pay | Admitting: Neurosurgery

## 2023-01-08 NOTE — Patient Instructions (Incomplete)

## 2023-01-08 NOTE — Progress Notes (Unsigned)
PROVIDER NOTE: Information contained herein reflects review and annotations entered in association with encounter. Interpretation of such information and data should be left to medically-trained personnel. Information provided to patient can be located elsewhere in the medical record under "Patient Instructions". Document created using STT-dictation technology, any transcriptional errors that may result from process are unintentional.    Patient: Laura [name]  Service Category: E/M  Provider: Oswaldo Done, MD  DOB: [birth year]  DOS: 01/09/2023  Referring Provider: Lynnea Ferrier, MD  MRN: [id number]  Specialty: Interventional Pain Management  PCP: Lynnea Ferrier, MD  Type: Established Patient  Setting: Ambulatory outpatient    Location: Office  Delivery: Face-to-face     HPI  Laura [name], a 77 y.o. year old female, is here today because of her Chronic pain syndrome [G89.4]. 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); Lumbar foraminal stenosis (Bilateral L3-4 and L5-S1); Chronic hip pain (Left); Osteoarthritis of knee (Right); Arthritis; Fibromyalgia syndrome; Chronic arthralgias of knees and hips (Right); Chronic hand pain (Left); Dropfoot (Left); Weakness of foot (Left); Sciatica (Left); Weakness of leg (Left); Chronic lower extremity pain (2ry area of Pain) (Bilateral); Lumbosacral radiculopathy at L5 (Left); Abnormal MRI, lumbar spine (04/30/2021);  DDD (degenerative disc disease), lumbosacral; Statin myopathy; Foot drop (Left); Ligamentum flavum hypertrophy (L1-2, L2-3, L3-4); Lumbar lateral recess stenosis (Bilateral) (L2-3, L3-4, L4-5) (Severe: L3-4); Lumbar Grade 1 Anterolisthesis of L3/L4 and L4/L5; Intractable back pain; Generalized osteoarthritis of multiple sites; Chronic low back pain (1ry area of Pain) (Bilateral) (L>R) w/o sciatica; Neurogenic bladder; and Neurogenic incontinence on their pertinent problem list. Pain Assessment: Severity of Chronic pain is reported as a 9 /10. Location: Back Lower, Right, Left/radaites from back down to legs and feet bilateral. Onset: More than a month ago. Quality: Constant, Sharp, Shooting. Timing: Constant. Modifying factor(s): Pain medication. Vitals:  height is 5\' 5"  (1.651 m) and weight is 240 lb (108.9 kg). Her temporal temperature is 97.8 F (36.6 C). Her blood pressure is 144/71 (abnormal) and her pulse is 65. Her respiration is 18 and oxygen saturation is 98%.  BMI: Estimated body mass index is 39.94 kg/m as calculated from the following:   Height as of this encounter: 5\' 5"  (1.651 m).   Weight as of this encounter: 240 lb (108.9 kg). Last encounter: 10/12/2022. Last procedure: Visit date not found.  Reason for encounter: medication management.  The patient indicates doing well with the current medication regimen. No adverse reactions or side effects reported to the medications.  I took the opportunity to explain the concept of tolerance and how "Drug Holidays" help control it.  I detailed how to do a slow taper to avoid withdrawals. Routine UDS ordered today.   Discussed the use of AI scribe software for clinical note transcription with the patient, who gave verbal consent to proceed.  History of Present Illness   The patient, with a history of rheumatoid arthritis and spinal stenosis, presents with worsening lower extremity weakness and new onset bowel incontinence. She reports severe pain  and immobility in one  leg, and numbness in the other. The patient's symptoms have been progressively worsening, leading to increased difficulty in maintaining independence at home.  The patient was previously scheduled for a decompression surgery for spinal stenosis, but the procedure was postponed due to the need for cardiac clearance and the new onset of bowel incontinence. The patient's family member reports that the incontinence is not constant, but occurs occasionally.  The patient is also on medication for her condition, but there is no mention of any adverse reactions or side effects. She expresses nervousness about the upcoming surgery, but acknowledges the need for it. The patient also inquires about the possibility of increasing her medication dosage, but ultimately agrees to continue with the current regimen.     RTCB: 04/16/2023   Pharmacotherapy Assessment  Analgesic: Oxycodone IR 5 mg every 6 hours (20 mg/day) MME/day: 30 mg/day.   Monitoring: [place] PMP: PDMP reviewed during this encounter.       Pharmacotherapy: No side-effects or adverse reactions reported. Compliance: No problems identified. Effectiveness: Clinically acceptable.  Earlyne Iba, RN  01/09/2023  1:03 PM  Sign when Signing Visit Nursing Pain Medication Assessment:  Safety precautions to be maintained throughout the outpatient stay will include: orient to surroundings, keep bed in low position, maintain call bell within reach at all times, provide assistance with transfer out of bed and ambulation.  Medication Inspection Compliance: Pill count conducted under aseptic conditions, in front of the patient. Neither the pills nor the bottle was removed from the patient's sight at any time. Once count was completed pills were immediately returned to the patient in their original bottle.  Medication: Oxycodone IR Pill/Patch Count:  32 of 120 pills remain Pill/Patch Appearance: Markings consistent with prescribed  medication Bottle Appearance: Standard pharmacy container. Clearly labeled. Filled Date: 45 / 07 / 2024 Last Medication intake:  Yesterday    No results found for: "CBDTHCR" No results found for: "D8THCCBX" No results found for: "D9THCCBX"  UDS:  Summary  Date Value Ref Range Status  06/22/2021 Note  Final    Comment:    ==================================================================== [employer] [facility] (MW) ==================================================================== Test                             Result       Flag       Units  Drug Present and Declared for Prescription Verification   Oxycodone                      1634         EXPECTED   ng/mg creat   Oxymorphone                    770          EXPECTED   ng/mg creat   Noroxycodone                   2933         EXPECTED   ng/mg creat   Noroxymorphone                 191          EXPECTED   ng/mg creat    Sources of oxycodone are scheduled prescription medications.    Oxymorphone, noroxycodone, and noroxymorphone are expected    metabolites of oxycodone. Oxymorphone is also available as a    scheduled prescription medication.  ==================================================================== Test  Result    Flag   Units      Ref Range   Creatinine              94               mg/dL      >=86 ==================================================================== Declared Medications:  The flagging and interpretation on this report are based on the  following declared medications.  Unexpected results may arise from  inaccuracies in the declared medications.   **Note: The testing scope of this panel includes these medications:   Oxycodone (OxyIR)   **Note: The testing scope of this panel does not include the  following reported medications:   Albuterol (Ventolin HFA)  Aspirin  Azelastine (Astelin)  Duloxetine (Cymbalta)  Folic Acid  Furosemide (Lasix)  Hydroxychloroquine  (Plaquenil)  Hydroxyzine (Vistaril)  Infliximab (Remicade)  Levothyroxine (Synthroid)  Loratadine (Claritin)  Methotrexate  Metoprolol (Toprol)  Multivitamin  Ondansetron (Zofran)  Rosuvastatin (Crestor)  Spironolactone (Aldactone)  Trazodone (Desyrel)  Vitamin D ==================================================================== For clinical consultation, please call [phone]. ====================================================================       ROS  Constitutional: Denies any fever or chills Gastrointestinal: No reported hemesis, hematochezia, vomiting, or acute GI distress Musculoskeletal: Denies any acute onset joint swelling, redness, loss of ROM, or weakness Neurological: No reported episodes of acute onset apraxia, aphasia, dysarthria, agnosia, amnesia, paralysis, loss of coordination, or loss of consciousness  Medication Review  Calcium-Magnesium-Vitamin D, DULoxetine, albuterol, aspirin EC, calcium carbonate, cholecalciferol, doxepin, esomeprazole, folic acid, furosemide, hydroxychloroquine, inFLIXimab, levothyroxine, loratadine, methotrexate, metoprolol succinate, naloxone, nitrofurantoin (macrocrystal-monohydrate), omeprazole, oxyCODONE, potassium chloride, and rosuvastatin  History Review  Allergy: Laura [name] is allergic to bupropion, jardiance [empagliflozin], and penicillins. Drug: Laura [name]  reports no history of drug use. Alcohol:  reports no history of alcohol use. Tobacco:  reports that she has never smoked. She has never used smokeless tobacco. Social: Laura [name]  reports that she has never smoked. She has never used smokeless tobacco. She reports that she does not drink alcohol and does not use drugs. Medical:  has a past medical history of Allergy, Anxiety, Arthritis, degenerative (10/08/2013), Asthma, Chronic hand pain, left (10/10/2019), Chronic kidney disease, stage 3 (HCC), Coronary artery disease involving native coronary artery of native  heart without angina pectoris, Depression, Essential hypertension, Heberden's nodes, Hypothyroidism, Lumbar spinal stenosis with neurogenic claudication (11/07/2014), Major depression, single episode, in complete remission (HCC) (06/25/2015), Memory loss, short term (03/19/2014), Mixed hyperlipidemia, MRSA infection, Osteoarthritis, Paroxysmal atrial fibrillation (HCC), Pneumonia, Rheumatoid arthritis (HCC), Sciatica of left side (12/31/2019), Seizure (HCC) (10/07/2014), Sleep apnea, Stool incontinence (10/2022), Thyroid disease, Type 2 diabetes mellitus without complication (HCC), and Urinary incontinence. Surgical: Laura [name]  has a past surgical history that includes Abdominal hysterectomy; Cesarean section; Total knee arthroplasty (Left); Hip fracture surgery (Right); Orif pelvic fracture with percutaneous screws (Bilateral, 02/02/2022); Diagnostic laparoscopy; Cataract extraction w/ intraocular lens  implant, bilateral; and LEFT HEART CATH AND CORONARY ANGIOGRAPHY (Left, 11/24/2022). Family: family history includes Alcohol abuse in her father; Depression in her father and sister; Heart attack in her father; Hypertension in her mother and sister; Post-traumatic stress disorder in her father; Rheum arthritis in her sister; Stroke in her mother.  Laboratory Chemistry Profile   Renal Lab Results  Component Value Date   BUN 18 10/29/2022   CREATININE 0.88 10/29/2022   GFRAA 57 (L) 10/03/2019   GFRNONAA >60 10/29/2022    Hepatic Lab Results  Component Value Date   AST 13 (L) 02/04/2022   ALT 6 02/04/2022  ALBUMIN 2.7 (L) 02/04/2022   ALKPHOS 78 02/04/2022   LIPASE 22 01/14/2021    Electrolytes Lab Results  Component Value Date   NA 137 10/29/2022   K 4.1 10/29/2022   CL 100 10/29/2022   CALCIUM 9.1 10/29/2022   MG 2.3 10/08/2018    Bone Lab Results  Component Value Date   VD25OH See Scanned report in [place] [facility] 02/03/2022   25OHVITD1 36 07/15/2015   25OHVITD2 3.6 07/15/2015    25OHVITD3 32 07/15/2015    Inflammation (CRP: Acute Phase) (ESR: Chronic Phase) Lab Results  Component Value Date   CRP 1.9 (H) 07/15/2015   ESRSEDRATE 32 (H) 07/15/2015   LATICACIDVEN 1.4 01/31/2022         Note: Above Lab results reviewed.  Recent Imaging Review  CARDIAC CATHETERIZATION   Prox RCA lesion is 50% stenosed.   Mid RCA lesion is 25% stenosed.   The left ventricular systolic function is normal.   LV end diastolic pressure is normal.   The left ventricular ejection fraction is 55-65% by visual estimate.   There is no mitral valve regurgitation.   In the absence of any other complications or medical issues, we expect  the patient to be ready for discharge from a cath perspective on  11/24/2022.   No indication for antiplatelet therapy at this time .  Conclusion  Outpatient left heart cath with coronaries possible PCI.  Left ventriculogram normal left ventricular function 55%  Coronaries Left main minor irregularities LAD large minor irregularities Circumflex minor irregularities RCA 50% proximal 25% mid Right dominant  Intervention deferred Not indicated Have patient follow-up in clinic with cardiology 1 to 2 weeks Note: Reviewed        Physical Exam  General appearance: Well nourished, well developed, and well hydrated. In no apparent acute distress Mental status: Alert, oriented x 3 (person, place, & time)       Respiratory: No evidence of acute respiratory distress Eyes: PERLA Vitals: BP (!) 144/71 (Cuff Size: Large)   Pulse 65   Temp 97.8 F (36.6 C) (Temporal)   Resp 18   Ht 5\' 5"  (1.651 m)   Wt 240 lb (108.9 kg)   SpO2 98%   BMI 39.94 kg/m  BMI: Estimated body mass index is 39.94 kg/m as calculated from the following:   Height as of this encounter: 5\' 5"  (1.651 m).   Weight as of this encounter: 240 lb (108.9 kg). Ideal: Ideal body weight: 57 kg (125 lb 10.6 oz) Adjusted ideal body weight: 77.7 kg (171 lb 6.4 oz)  Assessment    Diagnosis Status  1. Chronic pain syndrome   2. Chronic low back pain (1ry area of Pain) (Bilateral) (L>R) w/ sciatica (Bilateral)   3. Chronic lower extremity pain (2ry area of Pain) (Bilateral)   4. Lumbar Grade 1 Anterolisthesis of L3/L4 and L4/L5   5. Lumbar spinal stenosis (5 mm Severe L3-4; 8 mm L4-5) w/ neurogenic claudication   6. Lumbar lateral recess stenosis (Bilateral) (L2-3, L3-4, L4-5) (Severe: L3-4)   7. 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 Lumbar foraminal stenosis (Bilateral L3-4 and L5-S1)   9. Lumbar facet syndrome (Bilateral) (L>R)   10. Chronic hip pain (Left)   11. Chronic knee pain (Right)   12. Chronic hand pain (Left)   13. Seronegative rheumatoid arthritis (HCC)   14. Generalized osteoarthritis of multiple sites   15. Pharmacologic therapy   16. Chronic use of opiate for therapeutic purpose  17. Encounter for medication management   18. Encounter for chronic pain management   19. Neurogenic bladder   20. Neurogenic incontinence    Controlled Controlled Controlled   Updated Problems: Problem  Neurogenic Bladder  Neurogenic Incontinence    Plan of Care  Problem-specific:  Assessment and Plan    Severe Spinal Stenosis   She presents with severe spinal stenosis, leading to progressive weakness and neurogenic incontinence. Symptoms are worsening, with a risk of permanent damage if not addressed promptly. Immediate decompression surgery is recommended to prevent further deterioration. An alternative, more invasive decompression surgery requiring a two-day hospital stay was considered but deemed unnecessary. We will schedule decompression surgery as soon as possible and send a message to Dr. Myer Haff to coordinate the surgery.  Rheumatoid Arthritis   She has severe rheumatoid arthritis affecting her legs and spine, leading to significant mobility issues. The condition is chronic and currently poorly controlled. We discussed the  potential for medication titration post-surgery to manage pain and reduce tolerance. We will continue the current medication regimen and reassess pain management post-surgery with potential for medication titration.  General Health Maintenance   She is 77 years old with recent cardiac clearance for surgery. No adverse reactions to current medications have been reported. We will ensure all medications are up to date and maintain regular follow-ups with primary care and specialists as needed.  Follow-up   We will follow up with Dr. Myer Haff post-surgery and monitor for any changes in symptoms or new side effects from medications.       Laura [name] has a current medication list which includes the following long-term medication(s): albuterol, calcium-magnesium-vitamin d, doxepin, duloxetine, infliximab, loratadine, metoprolol succinate, omeprazole, [START ON 01/16/2023] oxycodone, [START ON 02/15/2023] oxycodone, and [START ON 03/17/2023] oxycodone.  Pharmacotherapy (Medications Ordered): Meds ordered this encounter  Medications   oxyCODONE (OXY IR/ROXICODONE) 5 MG immediate release tablet    Sig: Take 1 tablet (5 mg total) by mouth every 6 (six) hours as needed for severe pain (pain score 7-10). Must last 30 days    Dispense:  120 tablet    Refill:  0    DO NOT: delete (not duplicate); no partial-fill (will deny script to complete), no refill request (F/U required). DISPENSE: 1 day early if closed on fill date. WARN: No CNS-depressants within 8 hrs of med.   oxyCODONE (OXY IR/ROXICODONE) 5 MG immediate release tablet    Sig: Take 1 tablet (5 mg total) by mouth every 6 (six) hours as needed for severe pain (pain score 7-10). Must last 30 days    Dispense:  120 tablet    Refill:  0    DO NOT: delete (not duplicate); no partial-fill (will deny script to complete), no refill request (F/U required). DISPENSE: 1 day early if closed on fill date. WARN: No CNS-depressants within 8 hrs of med.    oxyCODONE (OXY IR/ROXICODONE) 5 MG immediate release tablet    Sig: Take 1 tablet (5 mg total) by mouth every 6 (six) hours as needed for severe pain (pain score 7-10). Must last 30 days    Dispense:  120 tablet    Refill:  0    DO NOT: delete (not duplicate); no partial-fill (will deny script to complete), no refill request (F/U required). DISPENSE: 1 day early if closed on fill date. WARN: No CNS-depressants within 8 hrs of med.   Orders:  Orders Placed This Encounter  Procedures   Drug Screen 10 W/Conf, Serum  Release to patient:   Immediate   Follow-up plan:   Return in about 3 months (around 04/16/2023) for Eval-day (M,W), (F2F), (MM).      Interventional Therapies  Risk  Complexity Considerations:   Estimated body mass index is 38.27 kg/m as calculated from the following:   Height as of 12/22/20: 5\' 5"  (1.651 m).   Weight as of 12/22/20: 230 lb (104.3 kg). PLAQUENIL Anticoagulation (Stop: 11 days)   Planned  Pending:      Under consideration:      Completed:   Therapeutic right IA steroid knee injection x2 (10/15/2015)  Therapeutic right Hyalgan knee injection x1 (09/01/2016)  Therapeutic left lumbar facet block x1 (01/22/2015)  Therapeutic left IA hip injection x1 (03/17/2015)  Therapeutic left L4-5 LESI x1 (02/04/2020) (100/100/85/85)  Therapeutic midline L2-3 LESI x4 (10/21/2021) (0/0/50/LBP:80RLEP:50)    Therapeutic  Palliative (PRN) options:   Therapeutic right IA Hyalgan knee injections Palliative left IA hip joint injection  Diagnostic left lumbar facet MBB #2        Recent Visits Date Type Provider Dept  10/12/22 Office Visit Delano Metz, MD [facility]  Showing recent visits within past 90 days and meeting all other requirements Today's Visits Date Type Provider Dept  01/09/23 Office Visit Delano Metz, MD [facility]  Showing today's visits and meeting all other requirements Future Appointments No visits were found  meeting these conditions. Showing future appointments within next 90 days and meeting all other requirements  I discussed the assessment and treatment plan with the patient. The patient was provided an opportunity to ask questions and all were answered. The patient agreed with the plan and demonstrated an understanding of the instructions.  Patient advised to call back or seek an in-person evaluation if the symptoms or condition worsens.  Duration of encounter: 30 minutes.  Total time on encounter, as per AMA guidelines included both the face-to-face and non-face-to-face time personally spent by the physician and/or other qualified health care professional(s) on the day of the encounter (includes time in activities that require the physician or other qualified health care professional and does not include time in activities normally performed by clinical staff). Physician's time may include the following activities when performed: Preparing to see the patient (e.g., pre-charting review of records, searching for previously ordered imaging, lab work, and nerve conduction tests) Review of prior analgesic pharmacotherapies. Reviewing PMP Interpreting ordered tests (e.g., lab work, imaging, nerve conduction tests) Performing post-procedure evaluations, including interpretation of diagnostic procedures Obtaining and/or reviewing separately obtained history Performing a medically appropriate examination and/or evaluation Counseling and educating the patient/family/caregiver Ordering medications, tests, or procedures Referring and communicating with other health care professionals (when not separately reported) Documenting clinical information in the electronic or other health record Independently interpreting results (not separately reported) and communicating results to the patient/ family/caregiver Care coordination (not separately reported)  Note by: Oswaldo Done, MD Date: 01/09/2023;  Time: 1:35 PM

## 2023-01-09 ENCOUNTER — Encounter: Payer: Self-pay | Admitting: Pain Medicine

## 2023-01-09 ENCOUNTER — Encounter: Payer: Self-pay | Admitting: Neurosurgery

## 2023-01-09 ENCOUNTER — Ambulatory Visit: Payer: Medicare Other | Attending: Pain Medicine | Admitting: Pain Medicine

## 2023-01-09 ENCOUNTER — Ambulatory Visit (INDEPENDENT_AMBULATORY_CARE_PROVIDER_SITE_OTHER): Payer: Medicare Other | Admitting: Psychiatry

## 2023-01-09 ENCOUNTER — Encounter: Payer: Self-pay | Admitting: Psychiatry

## 2023-01-09 VITALS — BP 144/71 | HR 65 | Temp 97.8°F | Resp 18 | Ht 65.0 in | Wt 240.0 lb

## 2023-01-09 VITALS — BP 122/78 | HR 47 | Temp 97.8°F

## 2023-01-09 DIAGNOSIS — Z79899 Other long term (current) drug therapy: Secondary | ICD-10-CM | POA: Diagnosis present

## 2023-01-09 DIAGNOSIS — M159 Polyosteoarthritis, unspecified: Secondary | ICD-10-CM | POA: Diagnosis present

## 2023-01-09 DIAGNOSIS — Z91148 Patient's other noncompliance with medication regimen for other reason: Secondary | ICD-10-CM

## 2023-01-09 DIAGNOSIS — M47816 Spondylosis without myelopathy or radiculopathy, lumbar region: Secondary | ICD-10-CM | POA: Diagnosis present

## 2023-01-09 DIAGNOSIS — M79605 Pain in left leg: Secondary | ICD-10-CM | POA: Diagnosis present

## 2023-01-09 DIAGNOSIS — M48062 Spinal stenosis, lumbar region with neurogenic claudication: Secondary | ICD-10-CM

## 2023-01-09 DIAGNOSIS — F5101 Primary insomnia: Secondary | ICD-10-CM | POA: Diagnosis not present

## 2023-01-09 DIAGNOSIS — N319 Neuromuscular dysfunction of bladder, unspecified: Secondary | ICD-10-CM | POA: Insufficient documentation

## 2023-01-09 DIAGNOSIS — M5442 Lumbago with sciatica, left side: Secondary | ICD-10-CM | POA: Diagnosis present

## 2023-01-09 DIAGNOSIS — M25561 Pain in right knee: Secondary | ICD-10-CM | POA: Diagnosis present

## 2023-01-09 DIAGNOSIS — M0600 Rheumatoid arthritis without rheumatoid factor, unspecified site: Secondary | ICD-10-CM

## 2023-01-09 DIAGNOSIS — M25552 Pain in left hip: Secondary | ICD-10-CM | POA: Insufficient documentation

## 2023-01-09 DIAGNOSIS — G894 Chronic pain syndrome: Secondary | ICD-10-CM | POA: Diagnosis not present

## 2023-01-09 DIAGNOSIS — F330 Major depressive disorder, recurrent, mild: Secondary | ICD-10-CM

## 2023-01-09 DIAGNOSIS — M2428 Disorder of ligament, vertebrae: Secondary | ICD-10-CM | POA: Diagnosis present

## 2023-01-09 DIAGNOSIS — M5441 Lumbago with sciatica, right side: Secondary | ICD-10-CM | POA: Diagnosis present

## 2023-01-09 DIAGNOSIS — Z79891 Long term (current) use of opiate analgesic: Secondary | ICD-10-CM

## 2023-01-09 DIAGNOSIS — M48061 Spinal stenosis, lumbar region without neurogenic claudication: Secondary | ICD-10-CM | POA: Diagnosis present

## 2023-01-09 DIAGNOSIS — M79642 Pain in left hand: Secondary | ICD-10-CM | POA: Diagnosis present

## 2023-01-09 DIAGNOSIS — G8929 Other chronic pain: Secondary | ICD-10-CM | POA: Diagnosis present

## 2023-01-09 DIAGNOSIS — M4316 Spondylolisthesis, lumbar region: Secondary | ICD-10-CM | POA: Diagnosis not present

## 2023-01-09 DIAGNOSIS — M79604 Pain in right leg: Secondary | ICD-10-CM | POA: Diagnosis present

## 2023-01-09 MED ORDER — OXYCODONE HCL 5 MG PO TABS: 5.0000 mg | ORAL_TABLET | Freq: Four times a day (QID) | ORAL | 0 refills | Status: DC | PRN

## 2023-01-09 MED ORDER — DULOXETINE HCL 60 MG PO CPEP: 60.0000 mg | ORAL_CAPSULE | Freq: Every day | ORAL | 3 refills | Status: DC

## 2023-01-09 MED ORDER — DOXEPIN HCL 50 MG PO CAPS: 50.0000 mg | ORAL_CAPSULE | Freq: Every day | ORAL | 2 refills | Status: DC

## 2023-01-09 NOTE — Progress Notes (Signed)
Nursing Pain Medication Assessment:  Safety precautions to be maintained throughout the outpatient stay will include: orient to surroundings, keep bed in low position, maintain call bell within reach at all times, provide assistance with transfer out of bed and ambulation.  Medication Inspection Compliance: Pill count conducted under aseptic conditions, in front of the patient. Neither the pills nor the bottle was removed from the patient's sight at any time. Once count was completed pills were immediately returned to the patient in their original bottle.  Medication: Oxycodone IR Pill/Patch Count:  32 of 120 pills remain Pill/Patch Appearance: Markings consistent with prescribed medication Bottle Appearance: Standard pharmacy container. Clearly labeled. Filled Date: 22 / 07 / 2024 Last Medication intake:  Yesterday

## 2023-01-12 ENCOUNTER — Encounter: Payer: PRIVATE HEALTH INSURANCE | Admitting: Neurosurgery

## 2023-01-17 LAB — OXYCODONES,MS,WB/SP RFX
Oxycocone: 1.5 ng/mL
Oxycodones Confirmation: POSITIVE
Oxymorphone: NEGATIVE ng/mL

## 2023-01-17 LAB — DRUG SCREEN 10 W/CONF, SERUM
Amphetamines, IA: NEGATIVE ng/mL
Barbiturates, IA: NEGATIVE ug/mL
Benzodiazepines, IA: NEGATIVE ng/mL
Cocaine & Metabolite, IA: NEGATIVE ng/mL
Methadone, IA: NEGATIVE ng/mL
Opiates, IA: NEGATIVE ng/mL
Oxycodones, IA: POSITIVE ng/mL — AB
Phencyclidine, IA: NEGATIVE ng/mL
Propoxyphene, IA: NEGATIVE ng/mL
THC(Marijuana) Metabolite, IA: NEGATIVE ng/mL

## 2023-01-24 ENCOUNTER — Telehealth: Payer: Self-pay

## 2023-01-24 ENCOUNTER — Other Ambulatory Visit: Payer: Self-pay

## 2023-01-24 DIAGNOSIS — Z01818 Encounter for other preprocedural examination: Secondary | ICD-10-CM

## 2023-01-24 NOTE — Telephone Encounter (Signed)
 Planned surgery: L2-5 decompression   Surgery date: 02/13/23 at [facility]: [address], [place], [place] [address]) - you will find out your arrival time the business day before your surgery.   Pre-op appointment at [facility] Pre-admit Testing: we will call you with a date/time for this. If you are scheduled for an in person appointment, Pre-admit Testing is located on the first floor of the [facility] building, [address], [address]. Please bring all prescriptions in the original prescription bottles to your appointment. During this appointment, they will advise you which medications you can take the morning of surgery, and which medications you will need to hold for surgery. Labs (such as blood work, EKG) may be done at your pre-op appointment. You are not required to fast for these labs. Should you need to change your pre-op appointment, please call Pre-admit testing at [phone].     Remicade: hold 4 weeks before surgery and 4 weeks after surgery   Blood thinners:   Aspirin  325mg :   stop aspirin  325mg  7 days prior, resume aspirin  325mg  14 days after; OK to take aspirin  81mg  during this time         Surgical clearance: we will send a clearance form to Dr Fernande. They may wish to see you in their office prior to signing the clearance form. If so, they may call you to schedule an appointment.    Common restrictions after surgery: No bending, lifting, or twisting ("BLT"). Avoid lifting objects heavier than 10 pounds for the first 6 weeks after surgery. Where possible, avoid household activities that involve lifting, bending, reaching, pushing, or pulling such as laundry, vacuuming, grocery shopping, and childcare. Try to arrange for help from friends and family for these activities while you heal. Do not drive while taking prescription pain medication. Weeks 6 through 12 after surgery: avoid lifting more than 25 pounds.       How to contact us :  If you have any questions/concerns before or after surgery, you can reach us  at [phone], or you can send a mychart message. We can be reached by phone or mychart 8am-4pm, Monday-Friday.  *Please note: Calls after 4pm are forwarded to a third party answering service. Mychart messages are not routinely monitored during evenings, weekends, and holidays. Please call our office to contact the answering service for urgent concerns during non-business hours.    If you have FMLA/disability paperwork, please drop it off or fax it to [phone], attention Patty.   Appointments/FMLA & disability paperwork: Odetta Mora, & Ritta Registered Nurse/Surgery scheduler: Othelia Medical Assistants: Damien ODESSIA Sailors Physician Assistants: Lyle Decamp, PA-C, Edsel Goods, PA-C & Glade Boys, PA-C Surgeons: Reeves Daisy, MD & Penne Sharps, MD

## 2023-01-26 ENCOUNTER — Encounter: Payer: PRIVATE HEALTH INSURANCE | Admitting: Orthopedic Surgery

## 2023-02-01 ENCOUNTER — Other Ambulatory Visit: Payer: Self-pay | Admitting: Psychiatry

## 2023-02-01 DIAGNOSIS — F5101 Primary insomnia: Secondary | ICD-10-CM

## 2023-02-01 DIAGNOSIS — F330 Major depressive disorder, recurrent, mild: Secondary | ICD-10-CM

## 2023-02-02 ENCOUNTER — Encounter: Payer: Self-pay | Admitting: Pain Medicine

## 2023-02-02 ENCOUNTER — Encounter: Payer: PRIVATE HEALTH INSURANCE | Admitting: Orthopedic Surgery

## 2023-02-02 ENCOUNTER — Ambulatory Visit (INDEPENDENT_AMBULATORY_CARE_PROVIDER_SITE_OTHER): Payer: Medicare Other | Admitting: Professional Counselor

## 2023-02-02 DIAGNOSIS — Z91199 Patient's noncompliance with other medical treatment and regimen due to unspecified reason: Secondary | ICD-10-CM

## 2023-02-02 NOTE — Progress Notes (Signed)
Patient no-showed today's appointment; appointment was for 02/02/23 at 3 PM to establish outpatient therapy services.

## 2023-02-03 ENCOUNTER — Other Ambulatory Visit: Payer: Self-pay

## 2023-02-03 ENCOUNTER — Encounter
Admission: RE | Admit: 2023-02-03 | Discharge: 2023-02-03 | Disposition: A | Discharge status: Home / Self Care | Payer: Medicare Other | Source: Ambulatory Visit | Attending: Neurosurgery | Admitting: Neurosurgery

## 2023-02-03 VITALS — BP 132/80 | HR 65 | Temp 98.3°F | Resp 18 | Ht 66.0 in | Wt 240.0 lb

## 2023-02-03 DIAGNOSIS — E119 Type 2 diabetes mellitus without complications: Secondary | ICD-10-CM | POA: Insufficient documentation

## 2023-02-03 DIAGNOSIS — Z01812 Encounter for preprocedural laboratory examination: Secondary | ICD-10-CM | POA: Diagnosis present

## 2023-02-03 LAB — TYPE AND SCREEN
ABO/RH(D): A POS
Antibody Screen: NEGATIVE

## 2023-02-03 LAB — SURGICAL PCR SCREEN
MRSA, PCR: NEGATIVE
Staphylococcus aureus: POSITIVE — AB

## 2023-02-03 NOTE — Patient Instructions (Addendum)
Your procedure is scheduled on:   Monday February 3  Report to the Registration Desk on the 1st floor of the [facility] To find out your arrival time, please call [phone] between 1PM - 3PM on: Friday January 31  If your arrival time is 6:00 am, do not arrive before that time as the [facility] entrance doors do not open until 6:00 am.  REMEMBER: Instructions that are not followed completely may result in serious medical risk, up to and including death; or upon the discretion of your surgeon and anesthesiologist your surgery may need to be rescheduled.  Do not eat food after midnight the night before surgery.  No gum chewing or hard candies.  You may however, drink WATER up to 2 hours before you are scheduled to arrive for your surgery. Do not drink anything within 2 hours of your scheduled arrival time.  One week prior to surgery:  Monday January 27  Stop Anti-inflammatories (NSAIDS) such as Advil, Aleve, Ibuprofen, Motrin, Naproxen, Naprosyn and Aspirin based products such as Excedrin, Goody's Powder, BC Powder.  Stop ANY OVER THE COUNTER supplements until after surgery. Calcium Carb-Cholecalciferol  calcium carbonate (TUMS - DOSED IN MG ELEMENTAL CALCIUM)  loratadine (CLARITIN)  MAGNESIUM PO   inFLIXimab (REMICADE) hold 4 weeks prior to surgery, last dose Sunday January 5   You may however, continue to take Tylenol if needed for pain up until the day of surgery.  **Follow recommendations regarding stopping blood thinners.** Stop aspirin 7 days prior to surgery, last dose Sunday January 26   Continue taking all of your other prescription medications up until the day of surgery.  ON THE DAY OF SURGERY ONLY TAKE THESE MEDICATIONS WITH SIPS OF WATER:  DULoxetine (CYMBALTA)  esomeprazole (NEXIUM)  levothyroxine (SYNTHROID)  metoprolol succinate (TOPROL-XL)  oxyCODONE (OXY IR/ROXICODONE) if needed for pain folic acid (FOLVITE)  potassium chloride (KLOR-CON)    No  Alcohol for 24 hours before or after surgery.  No Smoking including e-cigarettes for 24 hours before surgery.  No chewable tobacco products for at least 6 hours before surgery.  No nicotine patches on the day of surgery.  Do not use any "recreational" drugs for at least a week (preferably 2 weeks) before your surgery.  Please be advised that the combination of cocaine and anesthesia may have negative outcomes, up to and including death. If you test positive for cocaine, your surgery will be cancelled.  On the morning of surgery brush your teeth with toothpaste and water, you may rinse your mouth with mouthwash if you wish. Do not swallow any toothpaste or mouthwash.  Use CHG Soap wipes as directed on instruction sheet.  Do not wear jewelry, make-up, hairpins, clips or nail polish.  For welded (permanent) jewelry: bracelets, anklets, waist bands, etc.  Please have this removed prior to surgery.  If it is not removed, there is a chance that hospital personnel will need to cut it off on the day of surgery.  Do not wear lotions, powders, or perfumes.   Do not shave body hair from the neck down 48 hours before surgery.  Contact lenses, hearing aids and dentures may not be worn into surgery.  Do not bring valuables to the hospital. [facility] is not responsible for any missing/lost belongings or valuables.   Notify your doctor if there is any change in your medical condition (cold, fever, infection).  Wear comfortable clothing (specific to your surgery type) to the hospital.  After surgery, you can help  prevent lung complications by doing breathing exercises.  Take deep breaths and cough every 1-2 hours.   If you are being admitted to the hospital overnight, leave your suitcase in the car. After surgery it may be brought to your room.  In case of increased patient census, it may be necessary for you, the patient, to continue your postoperative care in the Same Day Surgery  department.  If you are being discharged the day of surgery, you will not be allowed to drive home. You will need a responsible individual to drive you home and stay with you for 24 hours after surgery.   If you are taking public transportation, you will need to have a responsible individual with you.  Please call the Pre-admissions Testing Dept. at [phone] if you have any questions about these instructions.  Surgery Visitation Policy:  Patients having surgery or a procedure may have two visitors.  Children under the age of 32 must have an adult with them who is not the patient.  Temporary Visitor Restrictions Due to increasing cases of flu, RSV and COVID-19: Children ages 52 and under will not be able to visit patients in [facility] hospitals under most circumstances.  Inpatient Visitation:    Visiting hours are 7 a.m. to 8 p.m. Up to four visitors are allowed at one time in a patient room. The visitors may rotate out with other people during the day.  One visitor age 13 or older may stay with the patient overnight and must be in the room by 8 p.m.         Pre-operative 5 CHG Bath Instructions   You can play a key role in reducing the risk of infection after surgery. Your skin needs to be as free of germs as possible. You can reduce the number of germs on your skin by washing with CHG (chlorhexidine gluconate) soap before surgery. CHG is an antiseptic soap that kills germs and continues to kill germs even after washing.   DO NOT use if you have an allergy to chlorhexidine/CHG or antibacterial soaps. If your skin becomes reddened or irritated, stop using the CHG and notify one of our RNs at [phone].   Please shower with the CHG soap starting 4 days before surgery using the following schedule:     Please keep in mind the following:  DO NOT shave, including legs and underarms, starting the day of your first shower.   You may shave your face at any point  before/day of surgery.  Place clean sheets on your bed the day you start using CHG soap. Use a clean washcloth (not used since being washed) for each shower. DO NOT sleep with pets once you start using the CHG.   CHG Shower Instructions:  If you choose to wash your hair and private area, wash first with your normal shampoo/soap.  After you use shampoo/soap, rinse your hair and body thoroughly to remove shampoo/soap residue.  Turn the water OFF and apply about 3 tablespoons (45 ml) of CHG soap to a CLEAN washcloth.  Apply CHG soap ONLY FROM YOUR NECK DOWN TO YOUR TOES (washing for 3-5 minutes)  DO NOT use CHG soap on face, private areas, open wounds, or sores.  Pay special attention to the area where your surgery is being performed.  If you are having back surgery, having someone wash your back for you may be helpful. Wait 2 minutes after CHG soap is applied, then you may rinse off the CHG  soap.  Pat dry with a clean towel  Put on clean clothes/pajamas   If you choose to wear lotion, please use ONLY the CHG-compatible lotions on the back of this paper.     Additional instructions for the day of surgery: DO NOT APPLY any lotions, deodorants, cologne, or perfumes.   Put on clean/comfortable clothes.  Brush your teeth.  Ask your nurse before applying any prescription medications to the skin.      CHG Compatible Lotions   Aveeno Moisturizing lotion  Cetaphil Moisturizing Cream  Cetaphil Moisturizing Lotion  Clairol Herbal Essence Moisturizing Lotion, Dry Skin  Clairol Herbal Essence Moisturizing Lotion, Extra Dry Skin  Clairol Herbal Essence Moisturizing Lotion, Normal Skin  Curel Age Defying Therapeutic Moisturizing Lotion with Alpha Hydroxy  Curel Extreme Care Body Lotion  Curel Soothing Hands Moisturizing Hand Lotion  Curel Therapeutic Moisturizing Cream, Fragrance-Free  Curel Therapeutic Moisturizing Lotion, Fragrance-Free  Curel Therapeutic Moisturizing Lotion, Original  Formula  Eucerin Daily Replenishing Lotion  Eucerin Dry Skin Therapy Plus Alpha Hydroxy Crme  Eucerin Dry Skin Therapy Plus Alpha Hydroxy Lotion  Eucerin Original Crme  Eucerin Original Lotion  Eucerin Plus Crme Eucerin Plus Lotion  Eucerin TriLipid Replenishing Lotion  Keri Anti-Bacterial Hand Lotion  Keri Deep Conditioning Original Lotion Dry Skin Formula Softly Scented  Keri Deep Conditioning Original Lotion, Fragrance Free Sensitive Skin Formula  Keri Lotion Fast Absorbing Fragrance Free Sensitive Skin Formula  Keri Lotion Fast Absorbing Softly Scented Dry Skin Formula  Keri Original Lotion  Keri Skin Renewal Lotion Keri Silky Smooth Lotion  Keri Silky Smooth Sensitive Skin Lotion  Nivea Body Creamy Conditioning Oil  Nivea Body Extra Enriched Teacher, adult education Moisturizing Lotion Nivea Crme  Nivea Skin Firming Lotion  NutraDerm 30 Skin Lotion  NutraDerm Skin Lotion  NutraDerm Therapeutic Skin Cream  NutraDerm Therapeutic Skin Lotion  ProShield Protective Hand Cream  Provon moisturizing lotion

## 2023-02-10 ENCOUNTER — Encounter: Payer: Self-pay | Admitting: Neurosurgery

## 2023-02-10 NOTE — Progress Notes (Signed)
Perioperative / Anesthesia Services  Pre-Admission Testing Clinical Review / Pre-Operative Anesthesia Consult  Date: 02/10/23  Patient Demographics:  Name: Laura [name] DOB: [birth year] MRN:   [id number]  Planned Surgical Procedure(s):    Case: [id number] Date/Time: 02/13/23 1426   Procedure: L2-5 DECOMPRESSION   Anesthesia type: General   Pre-op diagnosis: M48.062 Neurogenic claudication due to lumbar spinal stenosis   Location: [facility] OR ROOM 03 / [facility]   Surgeons: Venetia Night, MD      NOTE: Available PAT nursing documentation and vital signs have been reviewed. Clinical nursing staff has updated patient's PMH/PSHx, current medication list, and drug allergies/intolerances to ensure comprehensive history available to assist in medical decision making as it pertains to the aforementioned surgical procedure and anticipated anesthetic course. Extensive review of available clinical information personally performed. Laura [name] PMH and PSHx updated with any diagnoses/procedures that  may have been inadvertently omitted during his intake with the pre-admission testing department's nursing staff.  Clinical Discussion:  Laura [name] is a 78 y.o. female who is submitted for pre-surgical anesthesia review and clearance prior to her undergoing the above procedure. Patient has never been a smoker in the past. 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  Patient is followed by cardiology Melton Alar, MD). She was last seen in the cardiology clinic on 12/12/2022; notes reviewed. At the time of her clinic visit, patient doing well overall from a cardiovascular  perspective. Patient denied any chest pain, shortness of breath, PND, orthopnea, palpitations, significant peripheral edema, weakness, fatigue, vertiginous symptoms, or presyncope/syncope. Patient with a past medical history significant for cardiovascular diagnoses. Documented physical exam was grossly benign, providing no evidence of acute exacerbation and/or decompensation of the patient's known cardiovascular conditions.  TTE performed on 03/18/2021 revealed a normal left ventricular systolic function with an EF of >55%. There were no regional wall motion abnormalities. Left ventricular diastolic Doppler parameters consistent with abnormal relaxation (G1DD). Right ventricular size and function normal.  There was trivial to mild pan valvular regurgitation valve regurgitation. All transvalvular gradients were noted to be normal providing no evidence suggestive of valvular stenosis. Aorta normal in size with no evidence of ectasia or aneurysmal dilatation.  Myocardial perfusion imaging study was performed on 11/08/2022 revealing a normal left ventricular systolic function with a hyperdynamic LVEF of 66%.  There were no regional wall motion abnormalities.  SPECT images demonstrated a small reversible perfusion defect in the apical septal wall suggestive of a potential small area of myocardial ischemia.  Summed difference score was 1.  TID + 0.94. Further evaluation was recommended.  Patient underwent diagnostic LEFT heart catheterization on 11/24/2022 revealing a normal left ventricular systolic function with an EF of 55 to 65%.  LVEDP was normal.  There was mild luminal irregularities noted within the LM, LAD, and LCx.  Additionally, there was 50% stenosis of the proximal RCA and a 25% stenosis of the mid RCA.  Given the nonobstructive nature of the patient's coronary artery disease, the decision was made to defer intervention opting for medical management.  Medical history consistent with a single episode  of paroxysmal atrial fibrillation in the past.  Episode was in the setting of pneumonia and hypokalemia; patient has had no recurrence. Blood pressure reasonably controlled at 130/82 mmHg  on currently prescribed diuretic (furosemide) and beta-blocker (metoprolol succinate) therapies.  Patient is on rosuvastatin for her HLD diagnosis and ASCVD prevention. T2DM well controlled on currently prescribed regimen; last HgbA1c was 6.7% when checked on 09/22/2022. She does have an OSAH diagnosis, and is reported to be compliant with prescribed nocturnal PAP therapy.  Functional capacity somewhat limited by patient's age, multiple medical comorbidities, and lower back pain.  Patient is on chronic opioid therapy due to her chronic pain syndrome; followed by pain management.  With that said, patient is able to complete all of her ADLs/IADLs independently without cardiovascular limitation.  Per the [facility], patient is able to achieve at least 4 METS of physical activity without experiencing any significant degrees of angina/anginal equivalent symptoms.  No changes were made to her medication regimen during her visit with cardiology.  Patient scheduled to follow-up with outpatient cardiology in 6 months or sooner if needed.  Laura [name] is scheduled for an elective L2-5 DECOMPRESSION on 02/13/2023 with Dr. Venetia Night, MD.  Given patient's past medical history significant for cardiovascular diagnoses, presurgical cardiac clearance was sought by the PAT team. Per cardiology, "this patient is optimized for surgery and may proceed with the planned procedural course with a LOW risk of significant perioperative cardiovascular complications".  In review of the patient's chart, it is noted that she is on daily oral antithrombotic therapy. She has been instructed on recommendations for holding her daily low dose ASA for 7 days prior to her procedure with plans to restart as soon as postoperative bleeding risk felt to be  minimized by his attending surgeon. The patient has been instructed that her last dose of ASA should be on 02/04/2022.Marland Kitchen  Patient denies previous perioperative complications with anesthesia in the past. In review her EMR, it is noted that patient underwent a general anesthetic course at [facility] ([facility]) in 01/2022 without documented complications.      02/03/2023    1:18 PM 01/09/2023   12:53 PM  Vitals with BMI  Height 5\' 6"  5\' 5"   Weight 240 lbs 240 lbs  BMI 38.76 39.94  Systolic 132 144  Diastolic 80 71  Pulse 65 65    Providers/Specialists:  NOTE: Primary physician provider listed below. Patient may have been seen by APP or partner within same practice.   PROVIDER ROLE / SPECIALTY LAST Donalynn Furlong, MD Neurosurgery (Surgeon) 08/25/2022  Lynnea Ferrier, MD Primary Care Provider 10/19/2022  Clotilde Dieter, DO Cardiology 12/12/2022  Delano Metz, MD   Pain Management 01/09/2023  Jomarie Longs, MD Psychiatry 01/09/2023  Gerrie Nordmann, MD Rheumatology 10/26/2022   Allergies:   Allergies  Allergen Reactions   Bupropion Other (See Comments)    Siezures   Jardiance [Empagliflozin] Other (See Comments)    Projectile vomiting   Penicillins Nausea And Vomiting and Rash   Current Home Medications:   No current facility-administered medications for this encounter.    acetaminophen (TYLENOL) 500 MG tablet   aspirin EC 81 MG tablet   Calcium Carb-Cholecalciferol (CALCIUM 600 + D PO)   calcium carbonate (TUMS - DOSED IN MG ELEMENTAL CALCIUM) 500 MG chewable tablet   DULoxetine (CYMBALTA) 60 MG capsule   esomeprazole (NEXIUM) 20 MG capsule   folic acid (FOLVITE) 1 MG tablet   furosemide (LASIX) 80 MG tablet   hydroxychloroquine (PLAQUENIL) 200 MG tablet   levothyroxine (SYNTHROID) 125 MCG tablet   loratadine (CLARITIN) 10 MG tablet   MAGNESIUM PO   methotrexate (RHEUMATREX) 2.5  MG tablet   metoprolol succinate (TOPROL-XL) 100 MG 24 hr tablet    naloxone (NARCAN) nasal spray 4 mg/0.1 mL   oxyCODONE (OXY IR/ROXICODONE) 5 MG immediate release tablet   potassium chloride (KLOR-CON) 10 MEQ tablet   rosuvastatin (CRESTOR) 5 MG tablet   doxepin (SINEQUAN) 50 MG capsule   inFLIXimab (REMICADE) 100 MG injection   [START ON 02/15/2023] oxyCODONE (OXY IR/ROXICODONE) 5 MG immediate release tablet   [START ON 03/17/2023] oxyCODONE (OXY IR/ROXICODONE) 5 MG immediate release tablet   History:   Past Medical History:  Diagnosis Date   Allergy    Anxiety    Aortic atherosclerosis (HCC)    Arthritis, degenerative 10/08/2013   Asthma    Cardiomegaly    Chronic kidney disease, stage 3 (HCC)    Chronic, continuous use of opioids    a.) followed by pain management; b.) has naloxone Rx available   Coronary artery disease involving native coronary artery of native heart without angina pectoris    a.) MV 11/08/2022: mild ischemia in apical septal wall (TID 0.94); b.) LHC 11/24/2022: minor luminal irregs LM/LAD/LCx, 50% pRCA, 25% mRCA - med mgmt   DDD (degenerative disc disease), lumbar    Depression    Diet-controlled type 2 diabetes mellitus (HCC)    Essential hypertension    GERD (gastroesophageal reflux disease)    Heberden's nodes    HFrEF (heart failure with reduced ejection fraction) (HCC)    a.) TTE 10/09/2018: EF 55-60%, no RWMAS, mild RVE, mild BAE, triv MR/TR, RVSP 48.7; b.) TTE 03/18/2021: EF >55%, no RWMAs, G!DD, triv AR/TR/PR, mild MR   History of bilateral cataract extraction    Hypothyroidism    Insomnia    Long term methotrexate user    Long-term use of aspirin therapy    Long-term use of hydroxychloroquine    Long-term use of infliximab    Lumbar spinal stenosis with neurogenic claudication 11/07/2014   Memory loss    Memory loss, short term 03/19/2014   Mixed hyperlipidemia    MRSA infection    upper lip   OSA on CPAP    Osteoarthritis    Paroxysmal atrial fibrillation (HCC)    a.) occurred in setting of PNA and  hypokalemia; no recurrence   Pneumonia    Pulmonary hypertension (HCC)    a.) TTE 10/09/2018: RVSP 48.7 mmHg   Renal cyst, left    Rheumatoid arthritis (HCC)    a.) on hydryoxychloroqine + MTX + infliximab   Sciatica of left side 12/31/2019   Seizure (HCC) 10/07/2014   one time episode from wellbutrin   Stool incontinence 10/2022   Umbilical hernia    Urinary incontinence    Vitamin D deficiency    Past Surgical History:  Procedure Laterality Date   ABDOMINAL HYSTERECTOMY     CATARACT EXTRACTION W/ INTRAOCULAR LENS  IMPLANT, BILATERAL     CESAREAN SECTION     x2   DIAGNOSTIC LAPAROSCOPY     HIP FRACTURE SURGERY Right    LEFT HEART CATH AND CORONARY ANGIOGRAPHY Left 11/24/2022   Procedure: LEFT HEART CATH AND CORONARY ANGIOGRAPHY;  Surgeon: Alwyn Pea, MD;  Location: [facility] INVASIVE CV LAB;  Service: Cardiovascular;  Laterality: Left;   ORIF PELVIC FRACTURE WITH PERCUTANEOUS SCREWS Bilateral 02/02/2022   Procedure: ORIF PELVIC FRACTURE WITH PERCUTANEOUS SCREWS;  Surgeon: Roby Lofts, MD;  Location: [facility] OR;  Service: Orthopedics;  Laterality: Bilateral;   TOTAL KNEE ARTHROPLASTY Left    Family History  Problem Relation Age  of Onset   Hypertension Mother    Stroke Mother    Heart attack Father    Alcohol abuse Father    Depression Father    Post-traumatic stress disorder Father    Rheum arthritis Sister    Hypertension Sister    Depression Sister    Breast cancer Neg Hx    Social History   Tobacco Use   Smoking status: Never   Smokeless tobacco: Never  Substance Use Topics   Alcohol use: No    Alcohol/week: 0.0 standard drinks of alcohol   Pertinent Clinical Results:  LABS:  Lab Results  Component Value Date   WBC 7.8 10/29/2022   HGB 12.4 10/29/2022   HCT 38.0 10/29/2022   MCV 92.0 10/29/2022   PLT 281 10/29/2022   Lab Results  Component Value Date   NA 137 10/29/2022   CL 100 10/29/2022   K 4.1 10/29/2022   CO2 25 10/29/2022   BUN 18  10/29/2022   CREATININE 0.88 10/29/2022   GFRNONAA >60 10/29/2022   CALCIUM 9.1 10/29/2022   ALBUMIN 2.7 (L) 02/04/2022   GLUCOSE 130 (H) 10/29/2022   Hospital Outpatient Visit on 02/03/2023  Component Date Value Ref Range Status   MRSA, PCR 02/03/2023 NEGATIVE  NEGATIVE Final   Staphylococcus aureus 02/03/2023 POSITIVE (A)  NEGATIVE Final   Comment: (NOTE) The Xpert SA Assay (FDA approved for NASAL specimens in patients 23 years of age and older), is one component of a comprehensive surveillance program. It is not intended to diagnose infection nor to guide or monitor treatment. Performed at [facility], [address] [place], [place] [address]    ABO/RH(D) 02/03/2023 A POS   Final   Antibody Screen 02/03/2023 NEG   Final   Sample Expiration 02/03/2023 02/17/2023,2359   Final   Extend sample reason 02/03/2023    Final                   Value:NO TRANSFUSIONS OR PREGNANCY IN THE PAST 3 MONTHS Performed at [facility], [address] [place], [place] [address]     ECG: Date: 10/28/2022  Time ECG obtained: 1319 PM Rate: 69 bpm Rhythm: normal sinus Axis (leads I and aVF): normal Intervals: PR 178 ms. QRS 84 ms. QTc 437 ms. ST segment and T wave changes: Nonspecific ST abnormality  Comparison: Similar to previous tracing obtained on 10/19/2022   IMAGING / PROCEDURES: LEFT HEART CATHETERIZATION AND CORONARY ANGIOGRAPHY performed on 11/24/2022 Normal left ventricular systolic function with EF of 55 to 65% Normal LVEDP Multivessel CAD Minor luminal irregularities of the LM, LAD, and LCx 50% stenosis of the proximal RCA 25% stenosis of the mid RCA   MYOCARDIAL PERFUSION IMAGING STUDY (LEXISCAN) performed on 11/08/2022 No chest pain or ischemic EKG changes. SPECT images suggest very small area of mild ischemia in apical septal wall, summed difference score 1.   No TID.   Left ventricle normal in size with LVEF of 66%. Pharmacological stress test is  abnormal.  Low risk study.   MR BRAIN WO CONTRAST performed on 10/28/2022 T2 hyperintense signal in the periventricular white matter, likely the sequela of chronic small vessel ischemic disease. No acute intracranial process. No evidence of acute or subacute infarct.  MR LUMBAR SPINE WO CONTRAST performed on 10/28/2022 L3-L4 severe spinal canal stenosis and mild bilateral neural foraminal narrowing, unchanged. Effacement of the lateral recesses at this level likely compresses the descending L4 nerve roots. L2-L3 moderate spinal canal stenosis and  mild bilateral neural foraminal narrowing, unchanged. Narrowing of the lateral recesses at this level could affect the descending L3 nerve roots. L4-L5 mild to moderate spinal canal stenosis, unchanged. Effacement of the lateral recesses at this level likely affects the descending L5 nerve roots. L1-L2 mild spinal canal stenosis, unchanged. Narrowing of the right lateral recess at this level could affect the descending right L2 nerve roots. Multilevel facet arthropathy, which is severe at L3-L4 and L4-L5, which can be a cause of back pain.   TRANSTHORACIC ECHOCARDIOGRAM performed on 03/18/2021 Normal left ventricular systolic function with an EF of >55% Left ventricular diastolic Doppler parameters consistent with abnormal relaxation (G1DD). Normal right ventricular systolic function Trivial AR, TR, PR Mild MR Normal transvalvular gradients; no valvular stenosis No pericardial effusion  Impression and Plan:  Laura [name] has been referred for pre-anesthesia review and clearance prior to her undergoing the planned anesthetic and procedural courses. Available labs, pertinent testing, and imaging results were personally reviewed by me in preparation for upcoming operative/procedural course. [facility] medical record has been updated following extensive record review and patient interview with PAT staff.   This patient has been appropriately  cleared by cardiology with an overall LOW/SEPTAL risk of experiencing significant perioperative cardiovascular complications. Based on clinical review performed today (02/10/23), barring any significant acute changes in the patient's overall condition, it is anticipated that she will be able to proceed with the planned surgical intervention. Any acute changes in clinical condition may necessitate her procedure being postponed and/or cancelled. Patient will meet with anesthesia team (MD and/or CRNA) on the day of her procedure for preoperative evaluation/assessment. Questions regarding anesthetic course will be fielded at that time.   Pre-surgical instructions were reviewed with the patient during his PAT appointment, and questions were fielded to satisfaction by PAT clinical staff. She has been instructed on which medications that she will need to hold prior to surgery, as well as the ones that have been deemed safe/appropriate to take on the day of his procedure. As part of the general education provided by PAT, patient made aware both verbally and in writing, that she would need to abstain from the use of any illegal substances during his perioperative course. She was advised that failure to follow the provided instructions could necessitate case cancellation or result in serious perioperative complications up to and including death. Patient encouraged to contact PAT and/or her surgeon's office to discuss any questions or concerns that may arise prior to surgery; verbalized understanding.   Laura Mulling, MSN, APRN, FNP-C, CEN [facility]  Perioperative Services Nurse Practitioner Phone: [phone] Fax: [phone] 02/10/23 12:45 PM  NOTE: This note has been prepared using Dragon dictation software. Despite my best ability to proofread, there is always the potential that unintentional transcriptional errors may still occur from this process.

## 2023-02-12 MED ORDER — ORAL CARE MOUTH RINSE: 15.0000 mL | Freq: Once | OROMUCOSAL | Status: AC

## 2023-02-12 MED ORDER — CEFAZOLIN IN SODIUM CHLORIDE 2-0.9 GM/100ML-% IV SOLN
2.0000 g | Freq: Once | INTRAVENOUS | Status: DC
  Filled 2023-02-12: qty 100

## 2023-02-12 MED ORDER — SODIUM CHLORIDE 0.9 % IV SOLN: INTRAVENOUS | Status: DC

## 2023-02-12 MED ORDER — CEFAZOLIN SODIUM-DEXTROSE 2-4 GM/100ML-% IV SOLN: 2.0000 g | INTRAVENOUS | Status: DC

## 2023-02-12 MED ORDER — CHLORHEXIDINE GLUCONATE 0.12 % MT SOLN
15.0000 mL | Freq: Once | OROMUCOSAL | Status: AC
  Administered 2023-02-13: 15 mL via OROMUCOSAL

## 2023-02-13 ENCOUNTER — Other Ambulatory Visit: Payer: Self-pay

## 2023-02-13 ENCOUNTER — Ambulatory Visit: Payer: Medicare Other | Admitting: Urgent Care

## 2023-02-13 ENCOUNTER — Ambulatory Visit
Admission: RE | Admit: 2023-02-13 | Discharge: 2023-02-13 | Disposition: A | Discharge status: Home / Self Care | Payer: Medicare Other | Attending: Neurosurgery | Admitting: Neurosurgery

## 2023-02-13 ENCOUNTER — Ambulatory Visit: Payer: Medicare Other

## 2023-02-13 ENCOUNTER — Ambulatory Visit: Payer: Self-pay | Admitting: Urgent Care

## 2023-02-13 ENCOUNTER — Encounter
Admission: RE | Disposition: A | Discharge status: Home / Self Care | Payer: Self-pay | Source: Home / Self Care | Attending: Neurosurgery

## 2023-02-13 ENCOUNTER — Encounter: Payer: Self-pay | Admitting: Neurosurgery

## 2023-02-13 DIAGNOSIS — I959 Hypotension, unspecified: Secondary | ICD-10-CM | POA: Diagnosis not present

## 2023-02-13 DIAGNOSIS — Z8673 Personal history of transient ischemic attack (TIA), and cerebral infarction without residual deficits: Secondary | ICD-10-CM | POA: Insufficient documentation

## 2023-02-13 DIAGNOSIS — E1122 Type 2 diabetes mellitus with diabetic chronic kidney disease: Secondary | ICD-10-CM | POA: Diagnosis not present

## 2023-02-13 DIAGNOSIS — I5022 Chronic systolic (congestive) heart failure: Secondary | ICD-10-CM | POA: Diagnosis not present

## 2023-02-13 DIAGNOSIS — K219 Gastro-esophageal reflux disease without esophagitis: Secondary | ICD-10-CM | POA: Insufficient documentation

## 2023-02-13 DIAGNOSIS — E039 Hypothyroidism, unspecified: Secondary | ICD-10-CM | POA: Diagnosis not present

## 2023-02-13 DIAGNOSIS — G4730 Sleep apnea, unspecified: Secondary | ICD-10-CM | POA: Insufficient documentation

## 2023-02-13 DIAGNOSIS — R32 Unspecified urinary incontinence: Secondary | ICD-10-CM | POA: Diagnosis not present

## 2023-02-13 DIAGNOSIS — M21371 Foot drop, right foot: Secondary | ICD-10-CM | POA: Diagnosis not present

## 2023-02-13 DIAGNOSIS — I130 Hypertensive heart and chronic kidney disease with heart failure and stage 1 through stage 4 chronic kidney disease, or unspecified chronic kidney disease: Secondary | ICD-10-CM | POA: Diagnosis not present

## 2023-02-13 DIAGNOSIS — Z01812 Encounter for preprocedural laboratory examination: Secondary | ICD-10-CM

## 2023-02-13 DIAGNOSIS — Z538 Procedure and treatment not carried out for other reasons: Secondary | ICD-10-CM | POA: Insufficient documentation

## 2023-02-13 DIAGNOSIS — N183 Chronic kidney disease, stage 3 unspecified: Secondary | ICD-10-CM | POA: Diagnosis not present

## 2023-02-13 DIAGNOSIS — M21372 Foot drop, left foot: Secondary | ICD-10-CM | POA: Diagnosis not present

## 2023-02-13 DIAGNOSIS — Z01818 Encounter for other preprocedural examination: Secondary | ICD-10-CM

## 2023-02-13 DIAGNOSIS — E119 Type 2 diabetes mellitus without complications: Secondary | ICD-10-CM

## 2023-02-13 DIAGNOSIS — M48062 Spinal stenosis, lumbar region with neurogenic claudication: Secondary | ICD-10-CM | POA: Diagnosis present

## 2023-02-13 HISTORY — DX: Atherosclerosis of aorta: I70.0

## 2023-02-13 HISTORY — DX: Obstructive sleep apnea (adult) (pediatric): G47.33

## 2023-02-13 HISTORY — DX: Long term (current) use of antimetabolite agent: Z79.631

## 2023-02-13 HISTORY — DX: Cyst of kidney, acquired: N28.1

## 2023-02-13 HISTORY — DX: Long term (current) use of immunosuppressive biologic: Z79.620

## 2023-02-13 HISTORY — DX: Opioid use, unspecified, uncomplicated: F11.90

## 2023-02-13 HISTORY — DX: Vitamin D deficiency, unspecified: E55.9

## 2023-02-13 HISTORY — DX: Cataract extraction status, right eye: Z98.41

## 2023-02-13 HISTORY — DX: Other intervertebral disc degeneration, lumbar region without mention of lumbar back pain or lower extremity pain: M51.369

## 2023-02-13 HISTORY — DX: Other long term (current) drug therapy: Z79.899

## 2023-02-13 HISTORY — DX: Gastro-esophageal reflux disease without esophagitis: K21.9

## 2023-02-13 HISTORY — DX: Insomnia, unspecified: G47.00

## 2023-02-13 HISTORY — DX: Other amnesia: R41.3

## 2023-02-13 HISTORY — DX: Long term (current) use of aspirin: Z79.82

## 2023-02-13 HISTORY — DX: Type 2 diabetes mellitus without complications: E11.9

## 2023-02-13 HISTORY — DX: Cataract extraction status, left eye: Z98.41

## 2023-02-13 HISTORY — DX: Umbilical hernia without obstruction or gangrene: K42.9

## 2023-02-13 HISTORY — DX: Unspecified systolic (congestive) heart failure: I50.20

## 2023-02-13 HISTORY — DX: Cardiomegaly: I51.7

## 2023-02-13 HISTORY — DX: Pulmonary hypertension, unspecified: I27.20

## 2023-02-13 LAB — GLUCOSE, CAPILLARY
Glucose-Capillary: 126 mg/dL — ABNORMAL HIGH (ref 70–99)
Glucose-Capillary: 107 mg/dL — ABNORMAL HIGH (ref 70–99)

## 2023-02-13 SURGERY — LUMBAR LAMINECTOMY/DECOMPRESSION MICRODISCECTOMY 3 LEVELS
Anesthesia: General

## 2023-02-13 MED ORDER — LACTATED RINGERS IV SOLN: INTRAVENOUS | Status: DC | PRN

## 2023-02-13 MED ORDER — PROPOFOL 10 MG/ML IV BOLUS
INTRAVENOUS | Status: DC | PRN
  Administered 2023-02-13: 50 mg via INTRAVENOUS
  Administered 2023-02-13: 150 mg via INTRAVENOUS

## 2023-02-13 MED ORDER — FENTANYL CITRATE (PF) 100 MCG/2ML IJ SOLN
INTRAMUSCULAR | Status: AC
  Filled 2023-02-13: qty 2

## 2023-02-13 MED ORDER — REMIFENTANIL HCL 1 MG IV SOLR
INTRAVENOUS | Status: AC
  Filled 2023-02-13: qty 1000

## 2023-02-13 MED ORDER — ONDANSETRON HCL 4 MG/2ML IJ SOLN
INTRAMUSCULAR | Status: DC | PRN
  Administered 2023-02-13: 4 mg via INTRAVENOUS

## 2023-02-13 MED ORDER — SUCCINYLCHOLINE CHLORIDE 200 MG/10ML IV SOSY
PREFILLED_SYRINGE | INTRAVENOUS | Status: DC | PRN
  Administered 2023-02-13: 120 mg via INTRAVENOUS

## 2023-02-13 MED ORDER — BUPIVACAINE LIPOSOME 1.3 % IJ SUSP
INTRAMUSCULAR | Status: AC
  Filled 2023-02-13: qty 20

## 2023-02-13 MED ORDER — DEXAMETHASONE SODIUM PHOSPHATE 10 MG/ML IJ SOLN
INTRAMUSCULAR | Status: AC
  Filled 2023-02-13: qty 3

## 2023-02-13 MED ORDER — FENTANYL CITRATE (PF) 100 MCG/2ML IJ SOLN
INTRAMUSCULAR | Status: DC | PRN
  Administered 2023-02-13: 50 ug via INTRAVENOUS

## 2023-02-13 MED ORDER — SODIUM CHLORIDE (PF) 0.9 % IJ SOLN
INTRAMUSCULAR | Status: AC
  Filled 2023-02-13: qty 20

## 2023-02-13 MED ORDER — SEVOFLURANE IN SOLN
RESPIRATORY_TRACT | Status: AC
  Filled 2023-02-13: qty 250

## 2023-02-13 MED ORDER — CEFAZOLIN SODIUM-DEXTROSE 2-4 GM/100ML-% IV SOLN
INTRAVENOUS | Status: AC
  Filled 2023-02-13: qty 100

## 2023-02-13 MED ORDER — BUPIVACAINE HCL (PF) 0.5 % IJ SOLN
INTRAMUSCULAR | Status: AC
  Filled 2023-02-13: qty 30

## 2023-02-13 MED ORDER — EPHEDRINE 5 MG/ML INJ
INTRAVENOUS | Status: AC
  Filled 2023-02-13: qty 10

## 2023-02-13 MED ORDER — GLYCOPYRROLATE 0.2 MG/ML IJ SOLN
INTRAMUSCULAR | Status: DC | PRN
  Administered 2023-02-13: .2 mg via INTRAVENOUS

## 2023-02-13 MED ORDER — EPHEDRINE SULFATE-NACL 50-0.9 MG/10ML-% IV SOSY
PREFILLED_SYRINGE | INTRAVENOUS | Status: DC | PRN
  Administered 2023-02-13 (×2): 5 mg via INTRAVENOUS

## 2023-02-13 MED ORDER — PHENYLEPHRINE 80 MCG/ML (10ML) SYRINGE FOR IV PUSH (FOR BLOOD PRESSURE SUPPORT)
PREFILLED_SYRINGE | INTRAVENOUS | Status: DC | PRN
  Administered 2023-02-13 (×4): 160 ug via INTRAVENOUS

## 2023-02-13 MED ORDER — ONDANSETRON HCL 4 MG/2ML IJ SOLN
INTRAMUSCULAR | Status: AC
  Filled 2023-02-13: qty 4

## 2023-02-13 MED ORDER — LIDOCAINE HCL (CARDIAC) PF 100 MG/5ML IV SOSY
PREFILLED_SYRINGE | INTRAVENOUS | Status: DC | PRN
  Administered 2023-02-13: 100 mg via INTRAVENOUS

## 2023-02-13 MED ORDER — BUPIVACAINE-EPINEPHRINE (PF) 0.5% -1:200000 IJ SOLN
INTRAMUSCULAR | Status: AC
  Filled 2023-02-13: qty 10

## 2023-02-13 MED ORDER — VASOPRESSIN 20 UNIT/ML IV SOLN
INTRAVENOUS | Status: DC | PRN
  Administered 2023-02-13 (×2): 2 [IU] via INTRAVENOUS

## 2023-02-13 MED ORDER — CHLORHEXIDINE GLUCONATE 0.12 % MT SOLN
OROMUCOSAL | Status: AC
  Filled 2023-02-13: qty 15

## 2023-02-13 SURGICAL SUPPLY — 33 items
BASIN KIT SINGLE STR (MISCELLANEOUS) ×1 IMPLANT
BUR NEURO DRILL SOFT 3.0X3.8M (BURR) ×1 IMPLANT
DERMABOND ADVANCED .7 DNX12 (GAUZE/BANDAGES/DRESSINGS) ×1 IMPLANT
DRAPE C ARM PK CFD 31 SPINE (DRAPES) ×1 IMPLANT
DRAPE LAPAROTOMY 100X77 ABD (DRAPES) ×2 IMPLANT
DRAPE MICROSCOPE SPINE 48X150 (DRAPES) ×1 IMPLANT
DRSG OPSITE POSTOP 3X4 (GAUZE/BANDAGES/DRESSINGS) ×1 IMPLANT
ELECT EZSTD 165MM 6.5IN (MISCELLANEOUS)
ELECT REM PT RETURN 9FT ADLT (ELECTROSURGICAL)
ELECTRODE EZSTD 165MM 6.5IN (MISCELLANEOUS) ×1 IMPLANT
ELECTRODE REM PT RTRN 9FT ADLT (ELECTROSURGICAL) ×1 IMPLANT
GLOVE BIOGEL PI IND STRL 6.5 (GLOVE) ×1 IMPLANT
GLOVE SURG SYN 6.5 ES PF (GLOVE)
GLOVE SURG SYN 6.5 PF PI (GLOVE) ×1 IMPLANT
GLOVE SURG SYN 8.5 E (GLOVE)
GLOVE SURG SYN 8.5 PF PI (GLOVE) ×3 IMPLANT
GOWN SRG LRG LVL 4 IMPRV REINF (GOWNS) ×1 IMPLANT
GOWN SRG XL LVL 3 NONREINFORCE (GOWNS) ×1 IMPLANT
KIT SPINAL PRONEVIEW (KITS) ×1 IMPLANT
MANIFOLD NEPTUNE II (INSTRUMENTS) ×1 IMPLANT
MARKER SKIN DUAL TIP RULER LAB (MISCELLANEOUS) ×1 IMPLANT
NDL SAFETY ECLIPSE 18X1.5 (NEEDLE) ×1 IMPLANT
NS IRRIG 500ML POUR BTL (IV SOLUTION) ×1 IMPLANT
PACK LAMINECTOMY ARMC (PACKS) ×1 IMPLANT
PAD ARMBOARD 7.5X6 YLW CONV (MISCELLANEOUS) ×1 IMPLANT
SURGIFLO W/THROMBIN 8M KIT (HEMOSTASIS) ×1 IMPLANT
SUT STRATA 3-0 15 PS-2 (SUTURE) ×1 IMPLANT
SUT VIC AB 0 CT1 27XCR 8 STRN (SUTURE) ×1 IMPLANT
SUT VIC AB 2-0 CT1 18 (SUTURE) ×1 IMPLANT
SYR 30ML LL (SYRINGE) ×2 IMPLANT
SYR 3ML LL SCALE MARK (SYRINGE) ×1 IMPLANT
TRAP FLUID SMOKE EVACUATOR (MISCELLANEOUS) ×1 IMPLANT
WATER STERILE IRR 500ML POUR (IV SOLUTION) IMPLANT

## 2023-02-13 NOTE — Op Note (Signed)
Case aborted due to hypotension.  No surgery performed.

## 2023-02-13 NOTE — Anesthesia Postprocedure Evaluation (Signed)
Anesthesia Post Note  Patient: Laura [name]  Procedure(s) Performed: L2-5 DECOMPRESSION  Patient location during evaluation: PACU Anesthesia Type: General Level of consciousness: awake and alert Pain management: pain level controlled Vital Signs Assessment: post-procedure vital signs reviewed and stable Respiratory status: spontaneous breathing, nonlabored ventilation, respiratory function stable and patient connected to nasal cannula oxygen Cardiovascular status: blood pressure returned to baseline and stable Postop Assessment: no apparent nausea or vomiting Anesthetic complications: no  No notable events documented.   Last Vitals:  Vitals:   02/13/23 1605 02/13/23 1610  BP:    Pulse: 75 80  Resp: 17 16  Temp:    SpO2: 91% 97%    Last Pain:  Vitals:   02/13/23 1600  TempSrc:   PainSc: 0-No pain                 Stephanie Coup

## 2023-02-13 NOTE — Transfer of Care (Signed)
Immediate Anesthesia Transfer of Care Note  Patient: Lanora Manis Matty  Procedure(s) Performed: L2-5 DECOMPRESSION  Patient Location: PACU  Anesthesia Type:General  Level of Consciousness: awake, drowsy, and patient cooperative  Airway & Oxygen Therapy: Patient Spontanous Breathing and Patient connected to face mask oxygen  Post-op Assessment: Report given to RN and Post -op Vital signs reviewed and stable  Post vital signs: Reviewed and stable  Last Vitals:  Vitals Value Taken Time  BP    Temp    Pulse 81 02/13/23 1555  Resp 20 02/13/23 1555  SpO2 100 % 02/13/23 1555  Vitals shown include unfiled device data.  Last Pain:  Vitals:   02/13/23 1343  TempSrc: Temporal  PainSc: 0-No pain      Patients Stated Pain Goal: 1 (02/13/23 1343)  Complications: No notable events documented.

## 2023-02-13 NOTE — Anesthesia Preprocedure Evaluation (Addendum)
Anesthesia Evaluation  Patient identified by MRN, date of birth, ID band Patient awake    Reviewed: Allergy & Precautions, NPO status , Patient's Chart, lab work & pertinent test results  History of Anesthesia Complications Negative for: history of anesthetic complications  Airway Mallampati: III  TM Distance: <3 FB Neck ROM: full    Dental  (+) Missing   Pulmonary neg shortness of breath, asthma , sleep apnea    Pulmonary exam normal        Cardiovascular Exercise Tolerance: Good hypertension, + CAD and +CHF  Normal cardiovascular exam     Neuro/Psych Seizures -,  PSYCHIATRIC DISORDERS       Neuromuscular disease CVA    GI/Hepatic Neg liver ROS,GERD  Controlled,,  Endo/Other  diabetesHypothyroidism    Renal/GU Renal disease     Musculoskeletal  (+) Arthritis ,    Abdominal   Peds  Hematology negative hematology ROS (+)   Anesthesia Other Findings Patient has cardiac clearance for this procedure.   Past Medical History: No date: Allergy No date: Anxiety No date: Aortic atherosclerosis (HCC) 10/08/2013: Arthritis, degenerative No date: Asthma No date: Cardiomegaly No date: Chronic kidney disease, stage 3 (HCC) No date: Chronic, continuous use of opioids     Comment:  a.) followed by pain management; b.) has naloxone Rx               available No date: Coronary artery disease involving native coronary artery of  native heart without angina pectoris     Comment:  a.) MV 11/08/2022: mild ischemia in apical septal wall               (TID 0.94); b.) LHC 11/24/2022: minor luminal irregs               LM/LAD/LCx, 50% pRCA, 25% mRCA - med mgmt No date: DDD (degenerative disc disease), lumbar No date: Depression No date: Diet-controlled type 2 diabetes mellitus (HCC) No date: Essential hypertension No date: GERD (gastroesophageal reflux disease) No date: Heberden's nodes No date: HFrEF (heart failure with  reduced ejection fraction) (HCC)     Comment:  a.) TTE 10/09/2018: EF 55-60%, no RWMAS, mild RVE, mild               BAE, triv MR/TR, RVSP 48.7; b.) TTE 03/18/2021: EF >55%,               no RWMAs, G!DD, triv AR/TR/PR, mild MR No date: History of bilateral cataract extraction No date: Hypothyroidism No date: Insomnia No date: Long term methotrexate user No date: Long-term use of aspirin therapy No date: Long-term use of hydroxychloroquine No date: Long-term use of infliximab 11/07/2014: Lumbar spinal stenosis with neurogenic claudication No date: Memory loss 03/19/2014: Memory loss, short term No date: Mixed hyperlipidemia No date: MRSA infection     Comment:  upper lip No date: OSA on CPAP No date: Osteoarthritis No date: Paroxysmal atrial fibrillation (HCC)     Comment:  a.) occurred in setting of PNA and hypokalemia; no               recurrence No date: Pneumonia No date: Pulmonary hypertension (HCC)     Comment:  a.) TTE 10/09/2018: RVSP 48.7 mmHg No date: Renal cyst, left No date: Rheumatoid arthritis (HCC)     Comment:  a.) on hydryoxychloroqine + MTX + infliximab 12/31/2019: Sciatica of left side 10/07/2014: Seizure (HCC)     Comment:  one time episode from wellbutrin 10/2022: Stool incontinence No  date: Umbilical hernia No date: Urinary incontinence No date: Vitamin D deficiency  Past Surgical History: No date: ABDOMINAL HYSTERECTOMY No date: CATARACT EXTRACTION W/ INTRAOCULAR LENS  IMPLANT, BILATERAL No date: CESAREAN SECTION     Comment:  x2 No date: DIAGNOSTIC LAPAROSCOPY No date: HIP FRACTURE SURGERY; Right 11/24/2022: LEFT HEART CATH AND CORONARY ANGIOGRAPHY; Left     Comment:  Procedure: LEFT HEART CATH AND CORONARY ANGIOGRAPHY;                Surgeon: Alwyn Pea, MD;  Location: [facility] INVASIVE              CV LAB;  Service: Cardiovascular;  Laterality: Left; 02/02/2022: ORIF PELVIC FRACTURE WITH PERCUTANEOUS SCREWS; Bilateral     Comment:   Procedure: ORIF PELVIC FRACTURE WITH PERCUTANEOUS               SCREWS;  Surgeon: Roby Lofts, MD;  Location: [facility] OR;               Service: Orthopedics;  Laterality: Bilateral; No date: TOTAL KNEE ARTHROPLASTY; Left  BMI    Body Mass Index: 38.73 kg/m      Reproductive/Obstetrics negative OB ROS                             Anesthesia Physical Anesthesia Plan  ASA: 3  Anesthesia Plan: General ETT   Post-op Pain Management:    Induction: Intravenous  PONV Risk Score and Plan: Ondansetron, Dexamethasone, Midazolam and Treatment may vary due to age or medical condition  Airway Management Planned: Oral ETT  Additional Equipment:   Intra-op Plan:   Post-operative Plan: Extubation in OR  Informed Consent: I have reviewed the patients History and Physical, chart, labs and discussed the procedure including the risks, benefits and alternatives for the proposed anesthesia with the patient or authorized representative who has indicated his/her understanding and acceptance.     Dental Advisory Given  Plan Discussed with: Anesthesiologist, CRNA and Surgeon  Anesthesia Plan Comments: (Patient consented for risks of anesthesia including but not limited to:  - adverse reactions to medications - damage to eyes, teeth, lips or other oral mucosa - nerve damage due to positioning  - sore throat or hoarseness - Damage to heart, brain, nerves, lungs, other parts of body or loss of life  Patient voiced understanding and assent.)       Anesthesia Quick Evaluation

## 2023-02-13 NOTE — Anesthesia Procedure Notes (Signed)
Procedure Name: Intubation Date/Time: 02/13/2023 3:03 PM  Performed by: Mohammed Kindle, CRNAPre-anesthesia Checklist: Patient identified, Emergency Drugs available, Suction available and Patient being monitored Patient Re-evaluated:Patient Re-evaluated prior to induction Oxygen Delivery Method: Circle system utilized Preoxygenation: Pre-oxygenation with 100% oxygen Induction Type: IV induction Ventilation: Mask ventilation without difficulty Laryngoscope Size: McGrath and 3 Grade View: Grade I Tube type: Oral Tube size: 7.0 mm Number of attempts: 1 Airway Equipment and Method: Stylet and Oral airway Placement Confirmation: ETT inserted through vocal cords under direct vision, positive ETCO2 and breath sounds checked- equal and bilateral Secured at: 21 cm Tube secured with: Tape Dental Injury: Teeth and Oropharynx as per pre-operative assessment

## 2023-02-13 NOTE — H&P (Signed)
Referring Physician:  No referring provider defined for this encounter.  Primary Physician:  Lynnea Ferrier, MD  History of Present Illness: 02/13/2023 Laura [name] presents with continued symptoms.  08/25/2022 Laura [name] is significantly recovered from her pelvic fracture.  She comes today with continued difficulty with walking due to bilateral foot drop.  She is also having increasing urinary incontinence.   03/29/2022 Laura [name] returns to see me.  She is about 6 weeks s/p fixation for a pelvic fracture by Dr. Jena Gauss.  She is recovering from that injury, and has been doing physical therapy.  She continues to have some back symptoms though they are not life limiting.    07/29/2021 Laura [name] is here today with a chief complaint of  low back pain as well as intermittent numbness in her legs particularly from her knees distally. She also endorses intermittent pain from her back radiating to her buttocks worse on the left than the right. Along with progressive weakness in her right foot. She has also been having urinary incontinence for the past 6-7 months.   She has been having progressive issues over the past 10 years.  Her pain is as bad as 9 out of 10 particular when she stands or walks.  She can walk with physical therapy, but is very limited due to her bilateral foot drops.   Bowel/Bladder Dysfunction: none  Conservative measures:  Physical therapy: has participated in Home Health PT with some relief Multimodal medical therapy including regular antiinflammatories: cymbalta, oxycodone Injections: has had epidural steroid injections with Dr. Laban Emperor   Past Surgery: denies  Laura [name] has no symptoms of cervical myelopathy.  The symptoms are causing a significant impact on the patient's life.    Progress Note from Manning Charity, [place] on 06/03/21: History of Present Illness: 06/03/2021 Laura [name] is a 78 y.o with a history of obesity,  rheumatoid arthritis on Plaquenil,CAD, HLD, lymphedema, hypertension, A-fib, CKD and chronic lumbosacral complaints who is here today for evaluation of her lumbar spine.  She states that she has continued to have low back pain since her visit last year as well as intermittent numbness in her legs particularly from her knees distally that seems to be worse with activity. She also endorses intermittent pain from her back radiating to her buttocks worse on the left than the right. She states she did have a fall about a year and a half ago which resulted in pain in her left lateral calf this is since resolved however she notes about 6 to 7 months of progressive weakness in her right foot which is new since her last visit. She also endorses about a year and a half intermittent urinary incontinence and wears diapers. Due to her progressive weakness in her lower extremities she has a fear of falling and is thus largely wheelchair-bound at home. Review of Systems:  A 10 point review of systems is negative, except for the pertinent positives and negatives detailed in the HPI.  Past Medical History: Past Medical History:  Diagnosis Date   Allergy    Anxiety    Aortic atherosclerosis (HCC)    Arthritis, degenerative 10/08/2013   Asthma    Cardiomegaly    Chronic kidney disease, stage 3 (HCC)    Chronic, continuous use of opioids    a.) followed by pain management; b.) has naloxone Rx available   Coronary artery disease involving native coronary artery of native heart without angina pectoris    a.)  MV 11/08/2022: mild ischemia in apical septal wall (TID 0.94); b.) LHC 11/24/2022: minor luminal irregs LM/LAD/LCx, 50% pRCA, 25% mRCA - med mgmt   DDD (degenerative disc disease), lumbar    Depression    Diet-controlled type 2 diabetes mellitus (HCC)    Essential hypertension    GERD (gastroesophageal reflux disease)    Heberden's nodes    HFrEF (heart failure with reduced ejection fraction) (HCC)    a.)  TTE 10/09/2018: EF 55-60%, no RWMAS, mild RVE, mild BAE, triv MR/TR, RVSP 48.7; b.) TTE 03/18/2021: EF >55%, no RWMAs, G!DD, triv AR/TR/PR, mild MR   History of bilateral cataract extraction    Hypothyroidism    Insomnia    Long term methotrexate user    Long-term use of aspirin therapy    Long-term use of hydroxychloroquine    Long-term use of infliximab    Lumbar spinal stenosis with neurogenic claudication 11/07/2014   Memory loss    Memory loss, short term 03/19/2014   Mixed hyperlipidemia    MRSA infection    upper lip   OSA on CPAP    Osteoarthritis    Paroxysmal atrial fibrillation (HCC)    a.) occurred in setting of PNA and hypokalemia; no recurrence   Pneumonia    Pulmonary hypertension (HCC)    a.) TTE 10/09/2018: RVSP 48.7 mmHg   Renal cyst, left    Rheumatoid arthritis (HCC)    a.) on hydryoxychloroqine + MTX + infliximab   Sciatica of left side 12/31/2019   Seizure (HCC) 10/07/2014   one time episode from wellbutrin   Stool incontinence 10/2022   Umbilical hernia    Urinary incontinence    Vitamin D deficiency     Past Surgical History: Past Surgical History:  Procedure Laterality Date   ABDOMINAL HYSTERECTOMY     CATARACT EXTRACTION W/ INTRAOCULAR LENS  IMPLANT, BILATERAL     CESAREAN SECTION     x2   DIAGNOSTIC LAPAROSCOPY     HIP FRACTURE SURGERY Right    LEFT HEART CATH AND CORONARY ANGIOGRAPHY Left 11/24/2022   Procedure: LEFT HEART CATH AND CORONARY ANGIOGRAPHY;  Surgeon: Alwyn Pea, MD;  Location: [facility] INVASIVE CV LAB;  Service: Cardiovascular;  Laterality: Left;   ORIF PELVIC FRACTURE WITH PERCUTANEOUS SCREWS Bilateral 02/02/2022   Procedure: ORIF PELVIC FRACTURE WITH PERCUTANEOUS SCREWS;  Surgeon: Roby Lofts, MD;  Location: [facility] OR;  Service: Orthopedics;  Laterality: Bilateral;   TOTAL KNEE ARTHROPLASTY Left     Allergies: Allergies as of 01/24/2023 - Review Complete 01/09/2023  Allergen Reaction Noted   Bupropion Other (See  Comments) 02/01/2022   Jardiance [empagliflozin] Other (See Comments) 01/14/2021   Penicillins Nausea And Vomiting and Rash 10/07/2014    Medications: Current Meds  Medication Sig   acetaminophen (TYLENOL) 500 MG tablet Take 500-1,000 mg by mouth every 6 (six) hours as needed for moderate pain (pain score 4-6).   aspirin EC 81 MG tablet Take 81 mg by mouth daily. Swallow whole.   Calcium Carb-Cholecalciferol (CALCIUM 600 + D PO) Take 2 tablets by mouth daily.   calcium carbonate (TUMS - DOSED IN MG ELEMENTAL CALCIUM) 500 MG chewable tablet Chew 1 tablet by mouth daily as needed for heartburn.   doxepin (SINEQUAN) 50 MG capsule TAKE 1 CAPSULE (50 MG TOTAL) BY MOUTH AT BEDTIME. STOP 25 MG - DOSE INCREASE   DULoxetine (CYMBALTA) 60 MG capsule Take 1 capsule (60 mg total) by mouth daily. Stop 20 mg , dose change   esomeprazole (  NEXIUM) 20 MG capsule Take 20 mg by mouth daily.   folic acid (FOLVITE) 1 MG tablet Take 1 mg by mouth daily.   furosemide (LASIX) 80 MG tablet Take 80 mg by mouth every Monday, Wednesday, and Friday.   hydroxychloroquine (PLAQUENIL) 200 MG tablet Take 200 mg by mouth daily.   levothyroxine (SYNTHROID) 125 MCG tablet Take 250 mcg by mouth daily before breakfast.   loratadine (CLARITIN) 10 MG tablet Take 10 mg by mouth daily.    MAGNESIUM PO Take 1 capsule by mouth daily. With vitamin D   methotrexate (RHEUMATREX) 2.5 MG tablet Take 15 mg by mouth every Sunday.   metoprolol succinate (TOPROL-XL) 100 MG 24 hr tablet Take 1 tablet (100 mg total) by mouth daily. Take with or immediately following a meal.   naloxone (NARCAN) nasal spray 4 mg/0.1 mL Place 1 spray into the nose as needed for up to 365 doses (for opioid-induced respiratory depresssion). In case of emergency (overdose), spray once into each nostril. If no response within 3 minutes, repeat application and call 911.   oxyCODONE (OXY IR/ROXICODONE) 5 MG immediate release tablet Take 1 tablet (5 mg total) by mouth  every 6 (six) hours as needed for severe pain (pain score 7-10). Must last 30 days   [START ON 02/15/2023] oxyCODONE (OXY IR/ROXICODONE) 5 MG immediate release tablet Take 1 tablet (5 mg total) by mouth every 6 (six) hours as needed for severe pain (pain score 7-10). Must last 30 days   potassium chloride (KLOR-CON) 10 MEQ tablet Take 10 mEq by mouth every Monday, Wednesday, and Friday.   rosuvastatin (CRESTOR) 5 MG tablet Take 5 mg by mouth 2 (two) times a week. (Sunday and Friday)   [DISCONTINUED] doxepin (SINEQUAN) 50 MG capsule Take 1 capsule (50 mg total) by mouth at bedtime. Stop 25 mg - dose increase    Social History: Social History   Tobacco Use   Smoking status: Never   Smokeless tobacco: Never  Vaping Use   Vaping status: Never Used  Substance Use Topics   Alcohol use: No    Alcohol/week: 0.0 standard drinks of alcohol   Drug use: Never    Family Medical History: Family History  Problem Relation Age of Onset   Hypertension Mother    Stroke Mother    Heart attack Father    Alcohol abuse Father    Depression Father    Post-traumatic stress disorder Father    Rheum arthritis Sister    Hypertension Sister    Depression Sister    Breast cancer Neg Hx     Physical Examination: Vitals:   02/13/23 1343  BP: (!) 139/44  Pulse: 78  Resp: 18  Temp: 97.6 F (36.4 C)   Heart sounds normal no MRG. Chest Clear to Auscultation Bilaterally.   General: Patient is well developed, well nourished, calm, collected, and in no apparent distress. Attention to examination is appropriate.  Neck:   Supple.  Full range of motion.  Respiratory: Patient is breathing without any difficulty.   NEUROLOGICAL:     Awake, alert, oriented to person, place, and time.  Speech is clear and fluent. Fund of knowledge is appropriate.   Cranial Nerves: Pupils equal round and reactive to light.  Facial tone is symmetric.  Facial sensation is symmetric. Shoulder shrug is symmetric. Tongue  protrusion is midline.  There is no pronator drift.  ROM of spine: full.    Strength: Side Biceps Triceps Deltoid Interossei Grip Wrist Ext. Wrist Flex.  R 5 5 5 5 5 5 5   L 5 5 5 5 5 5 5    Side Iliopsoas Quads Hamstring PF DF EHL  R 5 5 5 3  0 0  L 5 5 5 3 1  0   Reflexes are 1+ and symmetric at the biceps, triceps, brachioradialis, patella and achilles.   Hoffman's is absent.  Clonus is not present.   Bilateral upper and lower extremity sensation is symmetric but limited in her bilateral extremities to light touch.    No evidence of dysmetria noted.  Gait is untested due to bilateral foot drop.     Medical Decision Making  Imaging: MRI L spine 04/29/21 IMPRESSION: 1. Advanced lumbar spine degeneration with L3-4 and L4-5 anterolisthesis. 2. L3-4 chronic, severe spinal stenosis. 3. L2-3 and L4-5 moderate to advanced spinal stenosis. Bilateral L5 impingement at the subarticular recesses of L4-5.     Electronically Signed   By: Tiburcio Pea M.D.   On: 04/30/2021 06:15  I have personally reviewed the images and agree with the above interpretation.  Assessment and Plan: Laura [name] is a pleasant 78 y.o. female with back pain, neurogenic claudication, and bilateral foot drop.  Her foot drop is longstanding.  She has tried and failed physical therapy.  We will proceed with L2-5 decompression.        Kameo Bains K. Myer Haff MD, [facility] Neurosurgery

## 2023-02-17 ENCOUNTER — Telehealth: Payer: Self-pay

## 2023-02-17 DIAGNOSIS — M48062 Spinal stenosis, lumbar region with neurogenic claudication: Secondary | ICD-10-CM

## 2023-02-21 ENCOUNTER — Encounter: Payer: PRIVATE HEALTH INSURANCE | Admitting: Orthopedic Surgery

## 2023-02-28 ENCOUNTER — Ambulatory Visit: Payer: Medicare Other | Admitting: Psychiatry

## 2023-02-28 ENCOUNTER — Encounter: Payer: Medicare Other | Admitting: Orthopedic Surgery

## 2023-03-06 ENCOUNTER — Telehealth (INDEPENDENT_AMBULATORY_CARE_PROVIDER_SITE_OTHER): Payer: Medicare Other | Admitting: Psychiatry

## 2023-03-06 ENCOUNTER — Encounter: Payer: Self-pay | Admitting: Psychiatry

## 2023-03-06 DIAGNOSIS — F330 Major depressive disorder, recurrent, mild: Secondary | ICD-10-CM | POA: Diagnosis not present

## 2023-03-06 DIAGNOSIS — F5101 Primary insomnia: Secondary | ICD-10-CM

## 2023-03-06 NOTE — Progress Notes (Unsigned)
 Virtual Visit via Video Note  I connected with Laura [name] on 03/06/23 at  4:30 PM EST by a video enabled telemedicine application and verified that I am speaking with the correct person using two identifiers.  Location Provider Location : [facility] Patient Location : Home  Participants: Patient ,Spouse,Daughter, Provider    I discussed the limitations of evaluation and management by telemedicine and the availability of in person appointments. The patient expressed understanding and agreed to proceed.   I discussed the assessment and treatment plan with the patient. The patient was provided an opportunity to ask questions and all were answered. The patient agreed with the plan and demonstrated an understanding of the instructions.   The patient was advised to call back or seek an in-person evaluation if the symptoms worsen or if the condition fails to improve as anticipated.   [facility] MD OP Progress Note  03/06/2023 4:37 PM Evlyn Amason  MRN:  [id number]  Chief Complaint:  Chief Complaint  Patient presents with   Follow-up   Anxiety   Depression   Medication Refill   HPI: Laura [name] is a 78 year old Caucasian female who has a history of primary insomnia, MDD, chronic pain, lumbar spinal stenosis with neurogenic claudication, rheumatoid arthritis, history of total knee replacement, left, fibromyalgia, osteoarthritis of the bilateral hips, left foot drop, thyroid disease, multiple other medical problem was evaluated by telemedicine today.  She experiences ongoing depression and anxiety, which she attributes to unmet expectations following retirement. She feels tired and isolated. Her sleep has improved with medication, allowing her to sleep from around 11 PM to 9 or 10 AM, although she is occasionally woken by her cat. Denies suicidal thoughts are present, but she expresses distress over her chronic kidney disease and its implications.  She has chronic kidney disease, causing  frequent urination and requiring her to wear three to four diapers a day. This condition causes significant distress, and she is concerned about its potential to be life-threatening. Her daughter assists her with managing the diapers.  Her current medications include doxepin 50 mg, duloxetine, she is compliant.  She reports no side effects from these medications.  Collateral information obtained from daughter who reports she was able to get automatic pill dispenser and that has been again changer.  Patient continues to struggle with multiple medical issues which can be a problem and she does get frustrated on a regular basis.  They are trying to get her more help.  She has reached out to [facility] program and has not heard back.  She is trying to get her into hospice if possible planning to discuss with primary care provider.  Patient missed her psychotherapy visit since she did not have anyone to help her connect that day.  Interested in scheduling another appointment for therapy.     Visit Diagnosis:    ICD-10-CM   1. MDD (major depressive disorder), recurrent episode, mild (HCC)  F33.0     2. Primary insomnia  F51.01     3. Overuse of medication  Z91.148       Past Psychiatric History: I have reviewed past psychiatric history from progress note on 06/13/2018.  Past Medical History:  Past Medical History:  Diagnosis Date   Allergy    Anxiety    Aortic atherosclerosis (HCC)    Arthritis, degenerative 10/08/2013   Asthma    Cardiomegaly    Chronic kidney disease, stage 3 (HCC)    Chronic, continuous use of opioids    a.) followed  by pain management; b.) has naloxone Rx available   Coronary artery disease involving native coronary artery of native heart without angina pectoris    a.) MV 11/08/2022: mild ischemia in apical septal wall (TID 0.94); b.) LHC 11/24/2022: minor luminal irregs LM/LAD/LCx, 50% pRCA, 25% mRCA - med mgmt   DDD (degenerative disc disease), lumbar    Depression     Diet-controlled type 2 diabetes mellitus (HCC)    Essential hypertension    GERD (gastroesophageal reflux disease)    Heberden's nodes    HFrEF (heart failure with reduced ejection fraction) (HCC)    a.) TTE 10/09/2018: EF 55-60%, no RWMAS, mild RVE, mild BAE, triv MR/TR, RVSP 48.7; b.) TTE 03/18/2021: EF >55%, no RWMAs, G!DD, triv AR/TR/PR, mild MR   History of bilateral cataract extraction    Hypothyroidism    Insomnia    Long term methotrexate user    Long-term use of aspirin therapy    Long-term use of hydroxychloroquine    Long-term use of infliximab    Lumbar spinal stenosis with neurogenic claudication 11/07/2014   Memory loss    Memory loss, short term 03/19/2014   Mixed hyperlipidemia    MRSA infection    upper lip   OSA on CPAP    Osteoarthritis    Paroxysmal atrial fibrillation (HCC)    a.) occurred in setting of PNA and hypokalemia; no recurrence   Pneumonia    Pulmonary hypertension (HCC)    a.) TTE 10/09/2018: RVSP 48.7 mmHg   Renal cyst, left    Rheumatoid arthritis (HCC)    a.) on hydryoxychloroqine + MTX + infliximab   Sciatica of left side 12/31/2019   Seizure (HCC) 10/07/2014   one time episode from wellbutrin   Stool incontinence 10/2022   Umbilical hernia    Urinary incontinence    Vitamin D deficiency     Past Surgical History:  Procedure Laterality Date   ABDOMINAL HYSTERECTOMY     CATARACT EXTRACTION W/ INTRAOCULAR LENS  IMPLANT, BILATERAL     CESAREAN SECTION     x2   DIAGNOSTIC LAPAROSCOPY     HIP FRACTURE SURGERY Right    LEFT HEART CATH AND CORONARY ANGIOGRAPHY Left 11/24/2022   Procedure: LEFT HEART CATH AND CORONARY ANGIOGRAPHY;  Surgeon: Alwyn Pea, MD;  Location: [facility] INVASIVE CV LAB;  Service: Cardiovascular;  Laterality: Left;   ORIF PELVIC FRACTURE WITH PERCUTANEOUS SCREWS Bilateral 02/02/2022   Procedure: ORIF PELVIC FRACTURE WITH PERCUTANEOUS SCREWS;  Surgeon: Roby Lofts, MD;  Location: [facility] OR;  Service: Orthopedics;   Laterality: Bilateral;   TOTAL KNEE ARTHROPLASTY Left     Family Psychiatric History: I have reviewed family psychiatric history from progress note on 06/13/2018.  Family History:  Family History  Problem Relation Age of Onset   Hypertension Mother    Stroke Mother    Heart attack Father    Alcohol abuse Father    Depression Father    Post-traumatic stress disorder Father    Rheum arthritis Sister    Hypertension Sister    Depression Sister    Breast cancer Neg Hx     Social History: I have reviewed social history from progress note on 06/13/2018. Social History   Socioeconomic History   Marital status: Married    Spouse name: Jeannett Senior   Number of children: 2   Years of education: Not on file   Highest education level: Not on file  Occupational History    Comment: retired  Tobacco Use  Smoking status: Never   Smokeless tobacco: Never  Vaping Use   Vaping status: Never Used  Substance and Sexual Activity   Alcohol use: No    Alcohol/week: 0.0 standard drinks of alcohol   Drug use: Never   Sexual activity: Not Currently  Other Topics Concern   Not on file  Social History Narrative   Not on file   Social Drivers of Health   Financial Resource Strain: Low Risk  (10/19/2022)   Received from [facility]   Overall Financial Resource Strain (CARDIA)    Difficulty of Paying Living Expenses: Not hard at all  Food Insecurity: No Food Insecurity (10/19/2022)   [facility]   Hunger Vital Sign    Worried About Running Out of Food in the Last Year: Never true    Ran Out of Food in the Last Year: Never true  Transportation Needs: No Transportation Needs (10/19/2022)   Received from [facility] - Transportation    In the past 12 months, has lack of transportation kept you from medical appointments or from getting medications?: No    Lack of Transportation (Non-Medical): No  Physical Activity: Inactive  (11/15/2016)   Exercise Vital Sign    Days of Exercise per Week: 0 days    Minutes of Exercise per Session: 0 min  Stress: No Stress Concern Present (11/15/2016)   [employer] of Occupational Health - Occupational Stress Questionnaire    Feeling of Stress : Not at all  Social Connections: Somewhat Isolated (11/15/2016)   Social Connection and Isolation Panel [NHANES]    Frequency of Communication with Friends and Family: More than three times a week    Frequency of Social Gatherings with Friends and Family: Once a week    Attends Religious Services: Never    Database administrator or Organizations: No    Attends Banker Meetings: Never    Marital Status: Married    Allergies:  Allergies  Allergen Reactions   Bupropion Other (See Comments)    Siezures   Jardiance [Empagliflozin] Other (See Comments)    Projectile vomiting   Penicillins Nausea And Vomiting and Rash    Metabolic Disorder Labs: Lab Results  Component Value Date   HGBA1C 6.8 (H) 02/02/2022   MPG 148.46 02/02/2022   No results found for: "PROLACTIN" No results found for: "CHOL", "TRIG", "HDL", "CHOLHDL", "VLDL", "LDLCALC" Lab Results  Component Value Date   TSH 0.383 10/07/2018    Therapeutic Level Labs: No results found for: "LITHIUM" No results found for: "VALPROATE" No results found for: "CBMZ"  Current Medications: Current Outpatient Medications  Medication Sig Dispense Refill   acetaminophen (TYLENOL) 500 MG tablet Take 500-1,000 mg by mouth every 6 (six) hours as needed for moderate pain (pain score 4-6).     aspirin EC 81 MG tablet Take 81 mg by mouth daily. Swallow whole.     Calcium Carb-Cholecalciferol (CALCIUM 600 + D PO) Take 2 tablets by mouth daily.     calcium carbonate (TUMS - DOSED IN MG ELEMENTAL CALCIUM) 500 MG chewable tablet Chew 1 tablet by mouth daily as needed for heartburn.     doxepin (SINEQUAN) 50 MG capsule TAKE 1 CAPSULE (50 MG TOTAL) BY MOUTH AT BEDTIME. STOP  25 MG - DOSE INCREASE 90 capsule 0   DULoxetine (CYMBALTA) 60 MG capsule Take 1 capsule (60 mg total) by mouth daily. Stop 20 mg , dose change 30 capsule  3   esomeprazole (NEXIUM) 20 MG capsule Take 20 mg by mouth daily.     folic acid (FOLVITE) 1 MG tablet Take 1 mg by mouth daily.     furosemide (LASIX) 80 MG tablet Take 80 mg by mouth every Monday, Wednesday, and Friday.     hydroxychloroquine (PLAQUENIL) 200 MG tablet Take 200 mg by mouth daily.     inFLIXimab (REMICADE) 100 MG injection Inject 100 mg into the vein every 8 (eight) weeks.     levothyroxine (SYNTHROID) 125 MCG tablet Take 250 mcg by mouth daily before breakfast.     loratadine (CLARITIN) 10 MG tablet Take 10 mg by mouth daily.      MAGNESIUM PO Take 1 capsule by mouth daily. With vitamin D     methotrexate (RHEUMATREX) 2.5 MG tablet Take 15 mg by mouth every Sunday.  5   metoprolol succinate (TOPROL-XL) 100 MG 24 hr tablet Take 1 tablet (100 mg total) by mouth daily. Take with or immediately following a meal. 30 tablet 0   naloxone (NARCAN) nasal spray 4 mg/0.1 mL Place 1 spray into the nose as needed for up to 365 doses (for opioid-induced respiratory depresssion). In case of emergency (overdose), spray once into each nostril. If no response within 3 minutes, repeat application and call 911. 1 each 0   oxyCODONE (OXY IR/ROXICODONE) 5 MG immediate release tablet Take 1 tablet (5 mg total) by mouth every 6 (six) hours as needed for severe pain (pain score 7-10). Must last 30 days 120 tablet 0   oxyCODONE (OXY IR/ROXICODONE) 5 MG immediate release tablet Take 1 tablet (5 mg total) by mouth every 6 (six) hours as needed for severe pain (pain score 7-10). Must last 30 days 120 tablet 0   [START ON 03/17/2023] oxyCODONE (OXY IR/ROXICODONE) 5 MG immediate release tablet Take 1 tablet (5 mg total) by mouth every 6 (six) hours as needed for severe pain (pain score 7-10). Must last 30 days 120 tablet 0   potassium chloride (KLOR-CON) 10 MEQ  tablet Take 10 mEq by mouth every Monday, Wednesday, and Friday.     rosuvastatin (CRESTOR) 5 MG tablet Take 5 mg by mouth 2 (two) times a week. (Sunday and Friday)     No current facility-administered medications for this visit.     Musculoskeletal: Strength & Muscle Tone:  UTA Gait & Station:  Seated Patient leans: N/A  Psychiatric Specialty Exam: Review of Systems  Psychiatric/Behavioral:  The patient is nervous/anxious.     There were no vitals taken for this visit.There is no height or weight on file to calculate BMI.  General Appearance: Casual  Eye Contact:  Fair  Speech:  Clear and Coherent  Volume:  Normal  Mood:  Anxious  Affect:  Congruent  Thought Process:  Goal Directed and Descriptions of Associations: Intact  Orientation:  Full (Time, Place, and Person)  Thought Content: Logical   Suicidal Thoughts:  No  Homicidal Thoughts:  No  Memory:  Immediate;   Fair Recent;   Fair  Judgement:  Fair  Insight:  Fair  Psychomotor Activity:  Normal  Concentration:  Concentration: Fair and Attention Span: Fair  Recall:  [employer] of Knowledge: Fair  Language: Fair  Akathisia:  No  Handed:  Right  AIMS (if indicated): not done  Assets:  Desire for Improvement Housing Social Support  ADL's:  Intact  Cognition: WNL  Sleep:  Fair   Screenings: Midwife Visit  from 04/28/2020 in [facility]  AIMS Total Score 0      GAD-7    Flowsheet Row Office Visit from 01/09/2023 in [facility]  Total GAD-7 Score 10      PHQ2-9    Flowsheet Row Office Visit from 01/09/2023 in [facility] Most recent reading at 01/09/2023  1:01 PM Office Visit from 01/09/2023 in [facility] Most recent reading at 01/09/2023 11:36 AM Office Visit from 07/20/2022 in [facility]  [facility] Most recent reading at 07/20/2022  2:09 PM Office Visit from 04/04/2022 in [facility] Most recent reading at 04/04/2022  1:06 PM Procedure visit from 10/21/2021 in [facility] Most recent reading at 10/21/2021 11:54 AM  PHQ-2 Total Score 1 3 0 0 0  PHQ-9 Total Score -- 8 -- -- --      Flowsheet Row Admission (Discharged) from 02/13/2023 in [facility] Office Visit from 01/09/2023 in [facility] Admission (Discharged) from 11/24/2022 in [facility]  C-SSRS RISK CATEGORY No Risk No Risk No Risk        Assessment and Plan: Keimya Briddell is a 78 year old Caucasian female, married, retired, lives in [place], has a history of depression, insomnia, multiple medical problems currently struggling with her physical symptoms situational stressors will benefit from assessment and plan as noted below.  Depression - improving Primary insomnia - improving Chronic depression with ongoing symptoms, exacerbated by personal and family issues, including a recent argument with her spouse. No suicidal ideation. Current medication aids sleep. Emphasized the importance of therapy for symptom management and coping strategies. - Continue Doxepin 50 mg at bedtime - Continue Cymbalta 60 mg daily-reduced dosage. - Schedule therapy appointment with in-house therapist-I have communicated with staff to assist this patient. - Ensure family member assists with logging into virtual therapy sessions  Collateral information obtained from spouse as well as daughter who was present in session today.  Discussed psychotherapy sessions, establishing care with [facility].  Hospice is another option.  Follow-up - Follow-up in  clinic in 3 months, May 22 at 4:30 PM by video. - Communicated with staff to schedule this patient another time with our therapist for a virtual appointment.  Daughter agrees to help this patient connect.  Collaboration of Care: Collaboration of Care: Referral or follow-up with counselor/therapist AEB patient referred for therapy again.  Patient/Guardian was advised Release of Information must be obtained prior to any record release in order to collaborate their care with an outside provider. Patient/Guardian was advised if they have not already done so to contact the registration department to sign all necessary forms in order for [place] to release information regarding their care.   Consent: Patient/Guardian gives verbal consent for treatment and assignment of benefits for services provided during this visit. Patient/Guardian expressed understanding and agreed to proceed.   This note was generated in part or whole with voice recognition software. Voice recognition is usually quite accurate but there are transcription errors that can and very often do occur. I apologize for any typographical errors that were not detected and corrected.     Jomarie Longs, MD 03/06/2023, 4:37 PM

## 2023-03-28 ENCOUNTER — Encounter: Payer: Medicare Other | Admitting: Neurosurgery

## 2023-04-10 ENCOUNTER — Encounter: Payer: Medicare Other | Admitting: Pain Medicine

## 2023-04-10 NOTE — Progress Notes (Unsigned)
 PROVIDER NOTE: Information contained herein reflects review and annotations entered in association with encounter. Interpretation of such information and data should be left to medically-trained personnel. Information provided to patient can be located elsewhere in the medical record under "Patient Instructions". Document created using STT-dictation technology, any transcriptional errors that may result from process are unintentional.    Patient: Laura [name]  Service Category: E/M  Provider: Oswaldo Done, MD  DOB: [birth year]  DOS: 04/12/2023  Referring Provider: Lynnea Ferrier, MD  MRN: [id number]  Specialty: Interventional Pain Management  PCP: Lynnea Ferrier, MD  Type: Established Patient  Setting: Ambulatory outpatient    Location: Office  Delivery: Face-to-face     HPI  [name], a 78 y.o. year old female, is here today because of her No primary diagnosis found.. [name]'s primary complain today is No chief complaint on file.  Pertinent problems: 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); Lumbar foraminal stenosis (Bilateral L3-4 and L5-S1); Chronic hip pain (Left); Osteoarthritis of knee (Right); Arthritis; Fibromyalgia syndrome; Chronic arthralgias of knees and hips (Right); Chronic hand pain (Left); Dropfoot (Left); Weakness of foot (Left); Sciatica (Left); Weakness of leg (Left); Chronic lower extremity pain (2ry area of Pain) (Bilateral); Lumbosacral radiculopathy at L5 (Left); Abnormal MRI, lumbar spine  (04/30/2021); DDD (degenerative disc disease), lumbosacral; Statin myopathy; Foot drop (Left); Ligamentum flavum hypertrophy (L1-2, L2-3, L3-4); Lumbar lateral recess stenosis (Bilateral) (L2-3, L3-4, L4-5) (Severe: L3-4); Lumbar Grade 1 Anterolisthesis of L3/L4 and L4/L5; Intractable back pain; Generalized osteoarthritis of multiple sites; Chronic low back pain (1ry area of Pain) (Bilateral) (L>R) w/o sciatica; Neurogenic bladder; and Neurogenic incontinence on their pertinent problem list. Pain Assessment: Severity of   is reported as a  /10. Location:    / . Onset:  . Quality:  . Timing:  . Modifying factor(s):  Marland Kitchen Vitals:  vitals were not taken for this visit.  BMI: Estimated body mass index is 38.73 kg/m as calculated from the following:   Height as of 02/13/23: 5\' 6"  (1.676 m).   Weight as of 02/13/23: 239 lb 15.6 oz (108.9 kg). Last encounter: 01/09/2023. Last procedure: Visit date not found.  Reason for encounter: medication management. ***  Discussed the use of AI scribe software for clinical note transcription with the patient, who gave verbal consent to proceed.  History of Present Illness         RTCB: 07/15/2023   Pharmacotherapy Assessment  Analgesic: Oxycodone IR 5 mg every 6 hours (20 mg/day) MME/day: 30 mg/day.   Monitoring: Laura [name] PMP: PDMP reviewed during this encounter.       Pharmacotherapy: No side-effects or adverse reactions reported. Compliance: No problems identified. Effectiveness: Clinically acceptable.  No notes on file  No results found for: "CBDTHCR" No results found for: "D8THCCBX" No results found for: "D9THCCBX"  UDS:  Summary  Date Value Ref Range Status  06/22/2021 Note  Final    Comment:    ==================================================================== [employer] [facility] (MW) ==================================================================== Test  Result       Flag       Units  Drug Present and Declared for  Prescription Verification   Oxycodone                      1634         EXPECTED   ng/mg creat   Oxymorphone                    770          EXPECTED   ng/mg creat   Noroxycodone                   2933         EXPECTED   ng/mg creat   Noroxymorphone                 191          EXPECTED   ng/mg creat    Sources of oxycodone are scheduled prescription medications.    Oxymorphone, noroxycodone, and noroxymorphone are expected    metabolites of oxycodone. Oxymorphone is also available as a    scheduled prescription medication.  ==================================================================== Test                      Result    Flag   Units      Ref Range   Creatinine              94               mg/dL      >=16 ==================================================================== Declared Medications:  The flagging and interpretation on this report are based on the  following declared medications.  Unexpected results may arise from  inaccuracies in the declared medications.   **Note: The testing scope of this panel includes these medications:   Oxycodone (OxyIR)   **Note: The testing scope of this panel does not include the  following reported medications:   Albuterol (Ventolin HFA)  Aspirin  Azelastine (Astelin)  Duloxetine (Cymbalta)  Folic Acid  Furosemide (Lasix)  Hydroxychloroquine (Plaquenil)  Hydroxyzine (Vistaril)  Infliximab (Remicade)  Levothyroxine (Synthroid)  Loratadine (Claritin)  Methotrexate  Metoprolol (Toprol)  Multivitamin  Ondansetron (Zofran)  Rosuvastatin (Crestor)  Spironolactone (Aldactone)  Trazodone (Desyrel)  Vitamin D ==================================================================== For clinical consultation, please call [phone]. ====================================================================       ROS  Constitutional: Denies any fever or chills Gastrointestinal: No reported hemesis, hematochezia, vomiting, or acute GI  distress Musculoskeletal: Denies any acute onset joint swelling, redness, loss of ROM, or weakness Neurological: No reported episodes of acute onset apraxia, aphasia, dysarthria, agnosia, amnesia, paralysis, loss of coordination, or loss of consciousness  Medication Review  Calcium Carb-Cholecalciferol, DULoxetine, Magnesium, acetaminophen, aspirin EC, calcium carbonate, doxepin, esomeprazole, folic acid, furosemide, hydroxychloroquine, inFLIXimab, levothyroxine, loratadine, methotrexate, metoprolol succinate, naloxone, nitrofurantoin (macrocrystal-monohydrate), oxyCODONE, potassium chloride, and rosuvastatin  History Review  Allergy: Ms. Varas is allergic to bupropion, jardiance [empagliflozin], and penicillins. Drug: Ms. Erdmann  reports no history of drug use. Alcohol:  reports no history of alcohol use. Tobacco:  reports that she has never smoked. She has never used smokeless tobacco. Social: Ms. Iezzi  reports that she has never smoked. She has never used smokeless tobacco. She reports that she does not drink alcohol and does not use drugs. Medical:  has a past medical history of Allergy, Anxiety, Aortic atherosclerosis (HCC), Arthritis, degenerative (10/08/2013), Asthma, Cardiomegaly, Chronic kidney disease, stage  3 (HCC), Chronic, continuous use of opioids, Coronary artery disease involving native coronary artery of native heart without angina pectoris, DDD (degenerative disc disease), lumbar, Depression, Diet-controlled type 2 diabetes mellitus (HCC), Essential hypertension, GERD (gastroesophageal reflux disease), Heberden's nodes, HFrEF (heart failure with reduced ejection fraction) (HCC), History of bilateral cataract extraction, Hypothyroidism, Insomnia, Long term methotrexate user, Long-term use of aspirin therapy, Long-term use of hydroxychloroquine, Long-term use of infliximab, Lumbar spinal stenosis with neurogenic claudication (11/07/2014), Memory loss, Memory loss, short term  (03/19/2014), Mixed hyperlipidemia, MRSA infection, OSA on CPAP, Osteoarthritis, Paroxysmal atrial fibrillation (HCC), Pneumonia, Pulmonary hypertension (HCC), Renal cyst, left, Rheumatoid arthritis (HCC), Sciatica of left side (12/31/2019), Seizure (HCC) (10/07/2014), Stool incontinence (10/2022), Umbilical hernia, Urinary incontinence, and Vitamin D deficiency. Surgical: Ms. Shortridge  has a past surgical history that includes Abdominal hysterectomy; Cesarean section; Total knee arthroplasty (Left); Hip fracture surgery (Right); Orif pelvic fracture with percutaneous screws (Bilateral, 02/02/2022); Diagnostic laparoscopy; Cataract extraction w/ intraocular lens  implant, bilateral; and LEFT HEART CATH AND CORONARY ANGIOGRAPHY (Left, 11/24/2022). Family: family history includes Alcohol abuse in her father; Depression in her father and sister; Heart attack in her father; Hypertension in her mother and sister; Post-traumatic stress disorder in her father; Rheum arthritis in her sister; Stroke in her mother.  Laboratory Chemistry Profile   Renal Lab Results  Component Value Date   BUN 18 10/29/2022   CREATININE 0.88 10/29/2022   GFRAA 57 (L) 10/03/2019   GFRNONAA >60 10/29/2022    Hepatic Lab Results  Component Value Date   AST 13 (L) 02/04/2022   ALT 6 02/04/2022   ALBUMIN 2.7 (L) 02/04/2022   ALKPHOS 78 02/04/2022   LIPASE 22 01/14/2021    Electrolytes Lab Results  Component Value Date   NA 137 10/29/2022   K 4.1 10/29/2022   CL 100 10/29/2022   CALCIUM 9.1 10/29/2022   MG 2.3 10/08/2018    Bone Lab Results  Component Value Date   VD25OH See Scanned report in [facility] 02/03/2022   25OHVITD1 36 07/15/2015   25OHVITD2 3.6 07/15/2015   25OHVITD3 32 07/15/2015    Inflammation (CRP: Acute Phase) (ESR: Chronic Phase) Lab Results  Component Value Date   CRP 1.9 (H) 07/15/2015   ESRSEDRATE 32 (H) 07/15/2015   LATICACIDVEN 1.4 01/31/2022         Note: Above Lab results  reviewed.  Recent Imaging Review  CARDIAC CATHETERIZATION   Prox RCA lesion is 50% stenosed.   Mid RCA lesion is 25% stenosed.   The left ventricular systolic function is normal.   LV end diastolic pressure is normal.   The left ventricular ejection fraction is 55-65% by visual estimate.   There is no mitral valve regurgitation.   In the absence of any other complications or medical issues, we expect  the patient to be ready for discharge from a cath perspective on  11/24/2022.   No indication for antiplatelet therapy at this time .  Conclusion  Outpatient left heart cath with coronaries possible PCI.  Left ventriculogram normal left ventricular function 55%  Coronaries Left main minor irregularities LAD large minor irregularities Circumflex minor irregularities RCA 50% proximal 25% mid Right dominant  Intervention deferred Not indicated Have patient follow-up in clinic with cardiology 1 to 2 weeks Note: Reviewed        Physical Exam  General appearance: Well nourished, well developed, and well hydrated. In no apparent acute distress Mental status: Alert, oriented x 3 (person, place, & time)  Respiratory: No evidence of acute respiratory distress Eyes: PERLA Vitals: There were no vitals taken for this visit. BMI: Estimated body mass index is 38.73 kg/m as calculated from the following:   Height as of 02/13/23: 5\' 6"  (1.676 m).   Weight as of 02/13/23: 239 lb 15.6 oz (108.9 kg). Ideal: Patient weight not recorded  Assessment   Diagnosis Status  1. Chronic low back pain (1ry area of Pain) (Bilateral) (L>R) w/ sciatica (Bilateral)   2. Chronic lower extremity pain (2ry area of Pain) (Bilateral)   3. Lumbar Grade 1 Anterolisthesis of L3/L4 and L4/L5   4. 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 Lumbar lateral recess stenosis (Bilateral) (L2-3, L3-4, L4-5) (Severe: L3-4)   6. Ligamentum flavum hypertrophy (L1-2, L2-3, L3-4)    7. Lumbar foraminal stenosis (Bilateral L3-4 and L5-S1)   8. Lumbar facet syndrome (Bilateral) (L>R)   9. Chronic hip pain (Left)   10. Chronic knee pain (Right)   11. Chronic hand pain (Left)   12. Seronegative rheumatoid arthritis (HCC)   13. Generalized osteoarthritis of multiple sites   14. Chronic pain syndrome   15. Pharmacologic therapy   16. Chronic use of opiate for therapeutic purpose   17. Encounter for medication management   18. Encounter for chronic pain management    Controlled Controlled Controlled   Updated Problems: No problems updated.  Plan of Care  Problem-specific:  Assessment and Plan            Ms. Emonnie Cannady has a current medication list which includes the following long-term medication(s): calcium carb-cholecalciferol, doxepin, duloxetine, infliximab, loratadine, metoprolol succinate, and oxycodone.  Pharmacotherapy (Medications Ordered): No orders of the defined types were placed in this encounter.  Orders:  No orders of the defined types were placed in this encounter.  Follow-up plan:   No follow-ups on file.      Interventional Therapies  Risk  Complexity Considerations:   Estimated body mass index is 38.27 kg/m as calculated from the following:   Height as of 12/22/20: 5\' 5"  (1.651 m).   Weight as of 12/22/20: 230 lb (104.3 kg). PLAQUENIL Anticoagulation (Stop: 11 days)   Planned  Pending:      Under consideration:      Completed:   Therapeutic right IA steroid knee injection x2 (10/15/2015)  Therapeutic right Hyalgan knee injection x1 (09/01/2016)  Therapeutic left lumbar facet block x1 (01/22/2015)  Therapeutic left IA hip injection x1 (03/17/2015)  Therapeutic left L4-5 LESI x1 (02/04/2020) (100/100/85/85)  Therapeutic midline L2-3 LESI x4 (10/21/2021) (0/0/50/LBP:80RLEP:50)    Therapeutic  Palliative (PRN) options:   Therapeutic right IA Hyalgan knee injections Palliative left IA hip joint injection  Diagnostic  left lumbar facet MBB #2       Recent Visits No visits were found meeting these conditions. Showing recent visits within past 90 days and meeting all other requirements Future Appointments Date Type Provider Dept  04/12/23 Appointment Delano Metz, MD [facility]  Showing future appointments within next 90 days and meeting all other requirements  I discussed the assessment and treatment plan with the patient. The patient was provided an opportunity to ask questions and all were answered. The patient agreed with the plan and demonstrated an understanding of the instructions.  Patient advised to call back or seek an in-person evaluation if the symptoms or condition worsens.  Duration of encounter: *** minutes.  Total time on encounter, as per AMA guidelines  included both the face-to-face and non-face-to-face time personally spent by the physician and/or other qualified health care professional(s) on the day of the encounter (includes time in activities that require the physician or other qualified health care professional and does not include time in activities normally performed by clinical staff). Physician's time may include the following activities when performed: Preparing to see the patient (e.g., pre-charting review of records, searching for previously ordered imaging, lab work, and nerve conduction tests) Review of prior analgesic pharmacotherapies. Reviewing PMP Interpreting ordered tests (e.g., lab work, imaging, nerve conduction tests) Performing post-procedure evaluations, including interpretation of diagnostic procedures Obtaining and/or reviewing separately obtained history Performing a medically appropriate examination and/or evaluation Counseling and educating the patient/family/caregiver Ordering medications, tests, or procedures Referring and communicating with other health care professionals (when not separately reported) Documenting clinical information in  the electronic or other health record Independently interpreting results (not separately reported) and communicating results to the patient/ family/caregiver Care coordination (not separately reported)  Note by: Oswaldo Done, MD Date: 04/12/2023; Time: 7:21 AM

## 2023-04-10 NOTE — Telephone Encounter (Signed)
 Have not received a response at this time. Closing encounter. Will wait for the patient to reach out to [place].

## 2023-04-11 NOTE — Patient Instructions (Signed)

## 2023-04-12 ENCOUNTER — Ambulatory Visit: Payer: Medicare Other | Attending: Pain Medicine | Admitting: Pain Medicine

## 2023-04-12 ENCOUNTER — Encounter: Payer: Self-pay | Admitting: Pain Medicine

## 2023-04-12 VITALS — BP 122/79 | HR 61 | Temp 98.2°F | Resp 16 | Ht 65.0 in | Wt 240.0 lb

## 2023-04-12 DIAGNOSIS — M4316 Spondylolisthesis, lumbar region: Secondary | ICD-10-CM | POA: Diagnosis not present

## 2023-04-12 DIAGNOSIS — M0600 Rheumatoid arthritis without rheumatoid factor, unspecified site: Secondary | ICD-10-CM | POA: Diagnosis present

## 2023-04-12 DIAGNOSIS — G894 Chronic pain syndrome: Secondary | ICD-10-CM

## 2023-04-12 DIAGNOSIS — M5442 Lumbago with sciatica, left side: Secondary | ICD-10-CM | POA: Diagnosis present

## 2023-04-12 DIAGNOSIS — Z79899 Other long term (current) drug therapy: Secondary | ICD-10-CM | POA: Diagnosis present

## 2023-04-12 DIAGNOSIS — M48062 Spinal stenosis, lumbar region with neurogenic claudication: Secondary | ICD-10-CM

## 2023-04-12 DIAGNOSIS — Z79891 Long term (current) use of opiate analgesic: Secondary | ICD-10-CM

## 2023-04-12 DIAGNOSIS — M79604 Pain in right leg: Secondary | ICD-10-CM | POA: Diagnosis present

## 2023-04-12 DIAGNOSIS — G8929 Other chronic pain: Secondary | ICD-10-CM | POA: Diagnosis present

## 2023-04-12 DIAGNOSIS — M25561 Pain in right knee: Secondary | ICD-10-CM

## 2023-04-12 DIAGNOSIS — M48061 Spinal stenosis, lumbar region without neurogenic claudication: Secondary | ICD-10-CM | POA: Insufficient documentation

## 2023-04-12 DIAGNOSIS — M5441 Lumbago with sciatica, right side: Secondary | ICD-10-CM | POA: Diagnosis present

## 2023-04-12 DIAGNOSIS — M79642 Pain in left hand: Secondary | ICD-10-CM

## 2023-04-12 DIAGNOSIS — M25552 Pain in left hip: Secondary | ICD-10-CM

## 2023-04-12 DIAGNOSIS — M47816 Spondylosis without myelopathy or radiculopathy, lumbar region: Secondary | ICD-10-CM | POA: Diagnosis present

## 2023-04-12 DIAGNOSIS — M79605 Pain in left leg: Secondary | ICD-10-CM | POA: Diagnosis present

## 2023-04-12 DIAGNOSIS — Z87898 Personal history of other specified conditions: Secondary | ICD-10-CM | POA: Diagnosis present

## 2023-04-12 DIAGNOSIS — M2428 Disorder of ligament, vertebrae: Secondary | ICD-10-CM

## 2023-04-12 DIAGNOSIS — M159 Polyosteoarthritis, unspecified: Secondary | ICD-10-CM | POA: Diagnosis present

## 2023-04-12 MED ORDER — OXYCODONE HCL 5 MG PO TABS: 5.0000 mg | ORAL_TABLET | Freq: Four times a day (QID) | ORAL | 0 refills | Status: DC | PRN

## 2023-04-12 NOTE — Progress Notes (Signed)
 Nursing Pain Medication Assessment:  Safety precautions to be maintained throughout the outpatient stay will include: orient to surroundings, keep bed in low position, maintain call bell within reach at all times, provide assistance with transfer out of bed and ambulation.  Medication Inspection Compliance: Pill count conducted under aseptic conditions, in front of the patient. Neither the pills nor the bottle was removed from the patient's sight at any time. Once count was completed pills were immediately returned to the patient in their original bottle.  Medication: Oxycodone IR Pill/Patch Count:  20 of 120 pills remain Pill/Patch Appearance: Markings consistent with prescribed medication Bottle Appearance: Standard pharmacy container. Clearly labeled. Filled Date: 3 / 7 / 2025 Last Medication intake:  Today

## 2023-04-28 ENCOUNTER — Other Ambulatory Visit: Payer: Self-pay | Admitting: Psychiatry

## 2023-04-28 DIAGNOSIS — F5101 Primary insomnia: Secondary | ICD-10-CM

## 2023-04-28 DIAGNOSIS — F330 Major depressive disorder, recurrent, mild: Secondary | ICD-10-CM

## 2023-05-04 ENCOUNTER — Encounter: Payer: Medicare Other | Admitting: Orthopedic Surgery

## 2023-05-09 ENCOUNTER — Other Ambulatory Visit: Payer: Self-pay | Admitting: Psychiatry

## 2023-05-09 DIAGNOSIS — F330 Major depressive disorder, recurrent, mild: Secondary | ICD-10-CM

## 2023-05-10 ENCOUNTER — Telehealth: Payer: Self-pay

## 2023-05-10 NOTE — Telephone Encounter (Signed)
 Duloxetine  approved and sent to pharmacy as requested

## 2023-05-10 NOTE — Telephone Encounter (Signed)
 pt daughter left message that Laura [name] needs refill on the duloxetine . pt was last seen on 2-24 next appt 5-22

## 2023-05-11 ENCOUNTER — Encounter: Payer: Medicare Other | Admitting: Orthopedic Surgery

## 2023-05-11 NOTE — Telephone Encounter (Signed)
 Pt daughter notified

## 2023-05-12 DIAGNOSIS — F330 Major depressive disorder, recurrent, mild: Secondary | ICD-10-CM

## 2023-05-12 MED ORDER — DULOXETINE HCL 60 MG PO CPEP: 60.0000 mg | ORAL_CAPSULE | Freq: Every day | ORAL | 3 refills | Status: DC

## 2023-06-01 ENCOUNTER — Telehealth (INDEPENDENT_AMBULATORY_CARE_PROVIDER_SITE_OTHER): Payer: Medicare Other | Admitting: Psychiatry

## 2023-06-01 ENCOUNTER — Encounter: Payer: Self-pay | Admitting: Psychiatry

## 2023-06-01 DIAGNOSIS — F3341 Major depressive disorder, recurrent, in partial remission: Secondary | ICD-10-CM | POA: Diagnosis not present

## 2023-06-01 DIAGNOSIS — F5101 Primary insomnia: Secondary | ICD-10-CM

## 2023-06-01 NOTE — Progress Notes (Signed)
 Virtual Visit via Video Note  I connected with Laura [name] on 06/01/23 at  4:30 PM EDT by a video enabled telemedicine application and verified that I am speaking with the correct person using two identifiers.  Location Provider Location : [facility] Patient Location : Home  Participants: Patient , Provider    I discussed the limitations of evaluation and management by telemedicine and the availability of in person appointments. The patient expressed understanding and agreed to proceed.   I discussed the assessment and treatment plan with the patient. The patient was provided an opportunity to ask questions and all were answered. The patient agreed with the plan and demonstrated an understanding of the instructions.   The patient was advised to call back or seek an in-person evaluation if the symptoms worsen or if the condition fails to improve as anticipated.   [facility] MD OP Progress Note  06/02/2023 8:00 AM Symone Cornman  MRN:  [id number]  Chief Complaint:  Chief Complaint  Patient presents with   Follow-up   Depression   Medication Refill   Insomnia   Discussed the use of AI scribe software for clinical note transcription with the patient, who gave verbal consent to proceed.  History of Present Illness Laura [name] is a 78 year old Caucasian female, has a history of primary insomnia, MDD, chronic pain, lumbar spinal stenosis with neurogenic claudication, rheumatoid arthritis, history of total knee replacement left, fibromyalgia, osteoarthritis of bilateral hips, left foot drop, thyroid disease, chronic kidney disease, multiple other medical problems was evaluated by telemedicine today.  She is accompanied by her husband, Laura [name].  She has a history of depression and insomnia, currently managed with cymbalta  and doxepin . She experiences good sleep quality when adhering to her medication regimen but notes poor sleep without it. No significant side effects are reported from these  medications. She occasionally has thoughts about dying due to her multiple health issues but denies any active suicidal ideation or plans.  She has a history of high blood pressure and is on Lasix , taken on Monday, Wednesday, and Friday. She experiences urinary incontinence, requiring her to wear multiple diapers throughout the day and night, which she finds difficult to cope with.  She has experienced significant weight loss, approximately 30 pounds, over an unspecified period. She attributes this to skipping breakfast and lunch, only having dinner. Despite the weight loss, she reports a stable appetite and feels she is eating enough.  Her daily activities include watching TV and elevating her legs to manage swelling. She notes that elevating her legs helps reduce the swelling, which otherwise worsens if she does not. She has a supportive family, including her husband Laura [name], who assists her with medication management.  She appeared to be alert, oriented to person place time and situation.  Collateral information obtained from Laura [name] who reports patient is currently doing fairly well.  Visit Diagnosis:    ICD-10-CM   1. Recurrent major depressive disorder, in partial remission (HCC)  F33.41     2. Primary insomnia  F51.01       Past Psychiatric History: I have reviewed past psychiatric history from progress note on 06/13/2018.  Past Medical History:  Past Medical History:  Diagnosis Date   Allergy    Anxiety    Aortic atherosclerosis (HCC)    Arthritis, degenerative 10/08/2013   Asthma    Cardiomegaly    Chronic kidney disease, stage 3 (HCC)    Chronic, continuous use of opioids    a.) followed by pain  management; b.) has naloxone  Rx available   Coronary artery disease involving native coronary artery of native heart without angina pectoris    a.) MV 11/08/2022: mild ischemia in apical septal wall (TID 0.94); b.) LHC 11/24/2022: minor luminal irregs LM/LAD/LCx, 50% pRCA, 25% mRCA -  med mgmt   DDD (degenerative disc disease), lumbar    Depression    Diet-controlled type 2 diabetes mellitus (HCC)    Essential hypertension    GERD (gastroesophageal reflux disease)    Heberden's nodes    HFrEF (heart failure with reduced ejection fraction) (HCC)    a.) TTE 10/09/2018: EF 55-60%, no RWMAS, mild RVE, mild BAE, triv MR/TR, RVSP 48.7; b.) TTE 03/18/2021: EF >55%, no RWMAs, G!DD, triv AR/TR/PR, mild MR   History of bilateral cataract extraction    Hypothyroidism    Insomnia    Long term methotrexate  user    Long-term use of aspirin  therapy    Long-term use of hydroxychloroquine     Long-term use of infliximab    Lumbar spinal stenosis with neurogenic claudication 11/07/2014   Memory loss    Memory loss, short term 03/19/2014   Mixed hyperlipidemia    MRSA infection    upper lip   OSA on CPAP    Osteoarthritis    Paroxysmal atrial fibrillation (HCC)    a.) occurred in setting of PNA and hypokalemia; no recurrence   Pneumonia    Pulmonary hypertension (HCC)    a.) TTE 10/09/2018: RVSP 48.7 mmHg   Renal cyst, left    Rheumatoid arthritis (HCC)    a.) on hydryoxychloroqine + MTX + infliximab   Sciatica of left side 12/31/2019   Seizure (HCC) 10/07/2014   one time episode from wellbutrin   Stool incontinence 10/2022   Umbilical hernia    Urinary incontinence    Vitamin D  deficiency     Past Surgical History:  Procedure Laterality Date   ABDOMINAL HYSTERECTOMY     CATARACT EXTRACTION W/ INTRAOCULAR LENS  IMPLANT, BILATERAL     CESAREAN SECTION     x2   DIAGNOSTIC LAPAROSCOPY     HIP FRACTURE SURGERY Right    LEFT HEART CATH AND CORONARY ANGIOGRAPHY Left 11/24/2022   Procedure: LEFT HEART CATH AND CORONARY ANGIOGRAPHY;  Surgeon: Antonette Batters, MD;  Location: [facility] INVASIVE CV LAB;  Service: Cardiovascular;  Laterality: Left;   ORIF PELVIC FRACTURE WITH PERCUTANEOUS SCREWS Bilateral 02/02/2022   Procedure: ORIF PELVIC FRACTURE WITH PERCUTANEOUS SCREWS;   Surgeon: Laneta Pintos, MD;  Location: [facility] OR;  Service: Orthopedics;  Laterality: Bilateral;   TOTAL KNEE ARTHROPLASTY Left     Family Psychiatric History: I have reviewed family psychiatric history from progress note on 06/13/2018.  Family History:  Family History  Problem Relation Age of Onset   Hypertension Mother    Stroke Mother    Heart attack Father    Alcohol abuse Father    Depression Father    Post-traumatic stress disorder Father    Rheum arthritis Sister    Hypertension Sister    Depression Sister    Breast cancer Neg Hx     Social History: I have reviewed social history from progress note on 06/13/2018. Social History   Socioeconomic History   Marital status: Married    Spouse name: Mara Seminole   Number of children: 2   Years of education: Not on file   Highest education level: Not on file  Occupational History    Comment: retired  Tobacco Use   Smoking  status: Never   Smokeless tobacco: Never  Vaping Use   Vaping status: Never Used  Substance and Sexual Activity   Alcohol use: No    Alcohol/week: 0.0 standard drinks of alcohol   Drug use: Never   Sexual activity: Not Currently  Other Topics Concern   Not on file  Social History Narrative   Not on file   Social Drivers of Health   Financial Resource Strain: Low Risk  (10/19/2022)   [facility]   Overall Financial Resource Strain (CARDIA)    Difficulty of Paying Living Expenses: Not hard at all  Food Insecurity: No Food Insecurity (10/19/2022)   [facility]   Hunger Vital Sign    Worried About Running Out of Food in the Last Year: Never true    Ran Out of Food in the Last Year: Never true  Transportation Needs: No Transportation Needs (10/19/2022)   Received from [facility] - Transportation    In the past 12 months, has lack of transportation kept you from medical appointments or from getting medications?: No     Lack of Transportation (Non-Medical): No  Physical Activity: Inactive (11/15/2016)   Exercise Vital Sign    Days of Exercise per Week: 0 days    Minutes of Exercise per Session: 0 min  Stress: No Stress Concern Present (11/15/2016)   [employer] of Occupational Health - Occupational Stress Questionnaire    Feeling of Stress : Not at all  Social Connections: Somewhat Isolated (11/15/2016)   Social Connection and Isolation Panel [NHANES]    Frequency of Communication with Friends and Family: More than three times a week    Frequency of Social Gatherings with Friends and Family: Once a week    Attends Religious Services: Never    Database administrator or Organizations: No    Attends Banker Meetings: Never    Marital Status: Married    Allergies:  Allergies  Allergen Reactions   Bupropion Other (See Comments)    Siezures   Jardiance [Empagliflozin] Other (See Comments)    Projectile vomiting   Penicillins Nausea And Vomiting and Rash    Metabolic Disorder Labs: Lab Results  Component Value Date   HGBA1C 6.8 (H) 02/02/2022   MPG 148.46 02/02/2022   No results found for: "PROLACTIN" No results found for: "CHOL", "TRIG", "HDL", "CHOLHDL", "VLDL", "LDLCALC" Lab Results  Component Value Date   TSH 0.383 10/07/2018    Therapeutic Level Labs: No results found for: "LITHIUM" No results found for: "VALPROATE" No results found for: "CBMZ"  Current Medications: Current Outpatient Medications  Medication Sig Dispense Refill   nystatin cream (MYCOSTATIN) Apply topically.     predniSONE  (DELTASONE ) 5 MG tablet Take by mouth.     acetaminophen  (TYLENOL ) 500 MG tablet Take 500-1,000 mg by mouth every 6 (six) hours as needed for moderate pain (pain score 4-6).     aspirin  EC 81 MG tablet Take 81 mg by mouth daily. Swallow whole.     Calcium  Carb-Cholecalciferol  (CALCIUM  600 + D PO) Take 2 tablets by mouth daily.     calcium  carbonate (TUMS - DOSED IN MG ELEMENTAL  CALCIUM ) 500 MG chewable tablet Chew 1 tablet by mouth daily as needed for heartburn.     doxepin  (SINEQUAN ) 50 MG capsule Take 1 capsule (50 mg total) by mouth at bedtime. Stop 25 mg - dose increase 90 capsule 0   DULoxetine  (CYMBALTA )  60 MG capsule Take 1 capsule (60 mg total) by mouth daily. Stop 20 mg , dose change 90 capsule 3   esomeprazole (NEXIUM) 20 MG capsule Take 20 mg by mouth daily.     folic acid  (FOLVITE ) 1 MG tablet Take 1 mg by mouth daily.     furosemide  (LASIX ) 80 MG tablet Take 80 mg by mouth every Monday, Wednesday, and Friday.     hydroxychloroquine  (PLAQUENIL ) 200 MG tablet Take 200 mg by mouth daily.     inFLIXimab (REMICADE) 100 MG injection Inject 100 mg into the vein every 8 (eight) weeks.     levothyroxine  (SYNTHROID ) 125 MCG tablet Take 250 mcg by mouth daily before breakfast.     loratadine  (CLARITIN ) 10 MG tablet Take 10 mg by mouth daily.      MAGNESIUM PO Take 1 capsule by mouth daily. With vitamin D      methotrexate  (RHEUMATREX) 2.5 MG tablet Take 15 mg by mouth every Sunday.  5   metoprolol  succinate (TOPROL -XL) 100 MG 24 hr tablet Take 1 tablet (100 mg total) by mouth daily. Take with or immediately following a meal. 30 tablet 0   naloxone  (NARCAN ) nasal spray 4 mg/0.1 mL Place 1 spray into the nose as needed for up to 365 doses (for opioid-induced respiratory depresssion). In case of emergency (overdose), spray once into each nostril. If no response within 3 minutes, repeat application and call 911. 1 each 0   nitrofurantoin, macrocrystal-monohydrate, (MACROBID) 100 MG capsule Take 100 mg by mouth 2 (two) times daily.     oxyCODONE  (OXY IR/ROXICODONE ) 5 MG immediate release tablet Take 1 tablet (5 mg total) by mouth every 6 (six) hours as needed for severe pain (pain score 7-10). Must last 30 days 120 tablet 0   oxyCODONE  (OXY IR/ROXICODONE ) 5 MG immediate release tablet Take 1 tablet (5 mg total) by mouth every 6 (six) hours as needed for severe pain (pain score  7-10). Must last 30 days 120 tablet 0   [START ON 06/15/2023] oxyCODONE  (OXY IR/ROXICODONE ) 5 MG immediate release tablet Take 1 tablet (5 mg total) by mouth every 6 (six) hours as needed for severe pain (pain score 7-10). Must last 30 days 120 tablet 0   potassium chloride  (KLOR-CON ) 10 MEQ tablet Take 10 mEq by mouth every Monday, Wednesday, and Friday.     rosuvastatin  (CRESTOR ) 5 MG tablet Take 5 mg by mouth 2 (two) times a week. (Sunday and Friday)     No current facility-administered medications for this visit.     Musculoskeletal: Strength & Muscle Tone: UTA Gait & Station: Seated Patient leans: N/A  Psychiatric Specialty Exam: Review of Systems  Psychiatric/Behavioral:         Sad    There were no vitals taken for this visit.There is no height or weight on file to calculate BMI.  General Appearance: Casual  Eye Contact:  Fair  Speech:  Clear and Coherent  Volume:  Normal  Mood:  sad about her medical issues  Affect:  Appropriate  Thought Process:  Goal Directed and Descriptions of Associations: Intact  Orientation:  Full (Time, Place, and Person)  Thought Content: Logical   Suicidal Thoughts:  No  Homicidal Thoughts:  No  Memory:  Immediate;   Fair Recent;   Fair  Judgement:  Fair  Insight:  Fair  Psychomotor Activity:  Normal  Concentration:  Concentration: Fair and Attention Span: Fair  Recall:  [employer] of Knowledge: Fair  Language: Fair  Akathisia:  No  Handed:  Right  AIMS (if indicated): not done  Assets:  Communication Skills Desire for Improvement Housing Social Support  ADL's:  Intact  Cognition: WNL  Sleep:  Fair   Screenings: AIMS    Flowsheet Row Office Visit from 04/28/2020 in [facility]  AIMS Total Score 0      GAD-7    Flowsheet Row Office Visit from 01/09/2023 in [facility]  Total GAD-7 Score 10      PHQ2-9    Flowsheet Row Office Visit from  01/09/2023 in [facility] Most recent reading at 01/09/2023  1:01 PM Office Visit from 01/09/2023 in [facility] Most recent reading at 01/09/2023 11:36 AM Office Visit from 07/20/2022 in [facility] Most recent reading at 07/20/2022  2:09 PM Office Visit from 04/04/2022 in [facility] Most recent reading at 04/04/2022  1:06 PM Procedure visit from 10/21/2021 in [facility] Most recent reading at 10/21/2021 11:54 AM  PHQ-2 Total Score 1 3 0 0 0  PHQ-9 Total Score -- 8 -- -- --      Flowsheet Row Video Visit from 06/01/2023 in [facility] Video Visit from 03/06/2023 in [facility] Admission (Discharged) from 02/13/2023 in [facility]  C-SSRS RISK CATEGORY Low Risk No Risk No Risk        Assessment and Plan: Siobhan Zaro is a 78 year old Caucasian female, has a history of depression, insomnia, multiple medical problems was evaluated by telemedicine today.  Discussed assessment and plan as noted below.  Depression-in partial remission Primary insomnia-improving Chronic depression with comorbid multiple medical problems which does have an impact on mood although improving on the current medication regimen.  Declines referral for psychotherapy however reports working on coping strategies.  Does have good support system. - Continue Doxepin  50 mg at bedtime - Continue Cymbalta  60 mg daily-reduced dosage - Patient declines referral for therapy.  Collateral information obtained from spouse who reports patient is managing well.  Denies any current concerns.   Follow-up Follow-up in  clinic in 3 to 4 months or sooner if needed.  Collaboration of Care: Collaboration of Care: Patient refused AEB patient declines referral for CBT  Patient/Guardian was advised Release of Information must be obtained prior to any record release in order to collaborate their care with an outside provider. Patient/Guardian was advised if they have not already done so to contact the registration department to sign all necessary forms in order for us  to release information regarding their care.   Consent: Patient/Guardian gives verbal consent for treatment and assignment of benefits for services provided during this visit. Patient/Guardian expressed understanding and agreed to proceed.   This note was generated in part or whole with voice recognition software. Voice recognition is usually quite accurate but there are transcription errors that can and very often do occur. I apologize for any typographical errors that were not detected and corrected.    Willodean Leven, MD 06/02/2023, 8:00 AM

## 2023-06-26 ENCOUNTER — Telehealth: Payer: Self-pay | Admitting: Neurosurgery

## 2023-06-26 NOTE — Telephone Encounter (Signed)
 Left message for Paola Bohr, patient's daughter to call and schedule her mom an appt.

## 2023-06-26 NOTE — Telephone Encounter (Signed)
 Dr.Klein is referring the patient back to our office for spinal stenosis of lumbar region at multiple levels. Should she schedule with PA or Dr.Yarbrough?

## 2023-06-30 NOTE — Telephone Encounter (Signed)
 Left message for Paola Bohr, patient's daughter to call and schedule her mom an appt.

## 2023-07-05 ENCOUNTER — Ambulatory Visit: Attending: Nurse Practitioner | Admitting: Nurse Practitioner

## 2023-07-05 ENCOUNTER — Encounter: Payer: Self-pay | Admitting: Nurse Practitioner

## 2023-07-05 ENCOUNTER — Encounter: Admitting: Pain Medicine

## 2023-07-05 DIAGNOSIS — M48062 Spinal stenosis, lumbar region with neurogenic claudication: Secondary | ICD-10-CM | POA: Insufficient documentation

## 2023-07-05 DIAGNOSIS — M159 Polyosteoarthritis, unspecified: Secondary | ICD-10-CM | POA: Diagnosis present

## 2023-07-05 DIAGNOSIS — G894 Chronic pain syndrome: Secondary | ICD-10-CM | POA: Diagnosis present

## 2023-07-05 DIAGNOSIS — M0600 Rheumatoid arthritis without rheumatoid factor, unspecified site: Secondary | ICD-10-CM | POA: Insufficient documentation

## 2023-07-05 DIAGNOSIS — M25561 Pain in right knee: Secondary | ICD-10-CM | POA: Insufficient documentation

## 2023-07-05 DIAGNOSIS — M25552 Pain in left hip: Secondary | ICD-10-CM | POA: Insufficient documentation

## 2023-07-05 DIAGNOSIS — Z79899 Other long term (current) drug therapy: Secondary | ICD-10-CM | POA: Insufficient documentation

## 2023-07-05 DIAGNOSIS — M4316 Spondylolisthesis, lumbar region: Secondary | ICD-10-CM | POA: Insufficient documentation

## 2023-07-05 DIAGNOSIS — G8929 Other chronic pain: Secondary | ICD-10-CM | POA: Diagnosis present

## 2023-07-05 DIAGNOSIS — M5441 Lumbago with sciatica, right side: Secondary | ICD-10-CM | POA: Insufficient documentation

## 2023-07-05 DIAGNOSIS — M48061 Spinal stenosis, lumbar region without neurogenic claudication: Secondary | ICD-10-CM | POA: Diagnosis present

## 2023-07-05 DIAGNOSIS — M79605 Pain in left leg: Secondary | ICD-10-CM | POA: Diagnosis present

## 2023-07-05 DIAGNOSIS — M2428 Disorder of ligament, vertebrae: Secondary | ICD-10-CM | POA: Diagnosis present

## 2023-07-05 DIAGNOSIS — Z79891 Long term (current) use of opiate analgesic: Secondary | ICD-10-CM | POA: Insufficient documentation

## 2023-07-05 DIAGNOSIS — M5442 Lumbago with sciatica, left side: Secondary | ICD-10-CM | POA: Insufficient documentation

## 2023-07-05 DIAGNOSIS — M79642 Pain in left hand: Secondary | ICD-10-CM | POA: Diagnosis present

## 2023-07-05 DIAGNOSIS — M47816 Spondylosis without myelopathy or radiculopathy, lumbar region: Secondary | ICD-10-CM | POA: Diagnosis present

## 2023-07-05 DIAGNOSIS — M79604 Pain in right leg: Secondary | ICD-10-CM | POA: Diagnosis present

## 2023-07-05 MED ORDER — OXYCODONE HCL 5 MG PO TABS: 5.0000 mg | ORAL_TABLET | Freq: Four times a day (QID) | ORAL | 0 refills | Status: DC | PRN

## 2023-07-05 MED ORDER — OXYCODONE HCL 5 MG PO TABS
5.0000 mg | ORAL_TABLET | Freq: Four times a day (QID) | ORAL | 0 refills | Status: DC | PRN
Start: 2023-07-15 — End: 2023-09-29

## 2023-07-05 NOTE — Progress Notes (Signed)
 Safety precautions to be maintained throughout the outpatient stay will include: orient to surroundings, keep bed in low position, maintain call bell within reach at all times, provide assistance with transfer out of bed and ambulation.   Nursing Pain Medication Assessment:  Safety precautions to be maintained throughout the outpatient stay will include: orient to surroundings, keep bed in low position, maintain call bell within reach at all times, provide assistance with transfer out of bed and ambulation.  Medication Inspection Compliance: Pill count conducted under aseptic conditions, in front of the patient. Neither the pills nor the bottle was removed from the patient's sight at any time. Once count was completed pills were immediately returned to the patient in their original bottle.  Medication: Oxycodone  IR Pill/Patch Count: 120 Pill/Patch Appearance: Markings consistent with prescribed medication Bottle Appearance: Standard pharmacy container. Clearly labeled. Filled Date: 6 / 5 / 2025 Last Medication intake:  Today

## 2023-07-05 NOTE — Progress Notes (Signed)
 PROVIDER NOTE: Interpretation of information contained herein should be left to medically-trained personnel. Specific patient instructions are provided elsewhere under Patient Instructions section of medical record. This document was created in part using AI and STT-dictation technology, any transcriptional errors that may result from this process are unintentional.  Patient: Laura [name]  Service: E/M   PCP: Fernande Ophelia [name] DOUGLAS, MD  DOB: [birth year]  DOS: 07/05/2023  Provider: Emmy MARLA Blanch, NP  MRN: [id number]  Delivery: Face-to-face  Specialty: Interventional Pain Management  Type: Established Patient  Setting: Ambulatory outpatient facility  Specialty designation: 09  Referring Prov.: Fernande Ophelia [name] DOUGLAS, MD  Location: Outpatient office facility       History of present illness (HPI) [name], a 78 y.o. year old female, is here today because of her No primary diagnosis found.. Ms. Daus's primary complain today is Hand Pain (Bilateral hands, feet and legs)  Pertinent problems: Laura [name] has Chronic low back pain (1ry area of Pain) (Bilateral) (L>R) w/ sciatica (Bilateral); Sciatica (Left); Weakness of leg (Left); and Lumbosacral radiculopathy at L5 (Left) on their pertinent problem list.  Pain Assessment: Severity of Chronic pain is reported as a 4 /10. Location: Hand Right, Left (bilateral legs and feet)/denies. Onset: More than a month ago. Quality: Aching, Burning. Timing: Intermittent. Modifying factor(s): meds,. Vitals:  height is 5' 5 (1.651 m) and weight is 248 lb (112.5 kg). Her temperature is 97.3 F (36.3 C) (abnormal). Her blood pressure is 131/86 and her pulse is 79. Her oxygen saturation is 99%.  BMI: Estimated body mass index is 41.27 kg/m as calculated from the following:   Height as of this encounter: 5' 5 (1.651 m).   Weight as of this encounter: 248 lb (112.5 kg).  Last encounter: Visit date not found. Last procedure: Visit date not found.  Reason for  encounter: medication management.  The patient indicates doing well with current medication regimen.  No side effects or adverse reaction reported to medication.  Pharmacotherapy Assessment   Analgesic: Oxycodone  HCl (IR) 5 mg tablet every 6 hours as needed for severe pain.SABRA MME=30 Monitoring: [place] PMP: PDMP reviewed during this encounter.       Pharmacotherapy: No side-effects or adverse reactions reported. Compliance: No problems identified. Effectiveness: Clinically acceptable.  Laura Nathanel JINNY, RN  07/05/2023  3:21 PM  Sign when Signing Visit Safety precautions to be maintained throughout the outpatient stay will include: orient to surroundings, keep bed in low position, maintain call bell within reach at all times, provide assistance with transfer out of bed and ambulation.   Nursing Pain Medication Assessment:  Safety precautions to be maintained throughout the outpatient stay will include: orient to surroundings, keep bed in low position, maintain call bell within reach at all times, provide assistance with transfer out of bed and ambulation.  Medication Inspection Compliance: Pill count conducted under aseptic conditions, in front of the patient. Neither the pills nor the bottle was removed from the patient's sight at any time. Once count was completed pills were immediately returned to the patient in their original bottle.  Medication: Oxycodone  IR Pill/Patch Count: 120 Pill/Patch Appearance: Markings consistent with prescribed medication Bottle Appearance: Standard pharmacy container. Clearly labeled. Filled Date: 6 / 5 / 2025 Last Medication intake:  Today  UDS:  Summary  Date Value Ref Range Status  06/22/2021 Note  Final    Comment:    ==================================================================== [facility] (MW) ==================================================================== Test  Result       Flag       Units  Drug Present  and Declared for Prescription Verification   Oxycodone                       1634         EXPECTED   ng/mg creat   Oxymorphone                    770          EXPECTED   ng/mg creat   Noroxycodone                   2933         EXPECTED   ng/mg creat   Noroxymorphone                 191          EXPECTED   ng/mg creat    Sources of oxycodone  are scheduled prescription medications.    Oxymorphone, noroxycodone, and noroxymorphone are expected    metabolites of oxycodone . Oxymorphone is also available as a    scheduled prescription medication.  ==================================================================== Test                      Result    Flag   Units      Ref Range   Creatinine              94               mg/dL      >=79 ==================================================================== Declared Medications:  The flagging and interpretation on this report are based on the  following declared medications.  Unexpected results may arise from  inaccuracies in the declared medications.   **Note: The testing scope of this panel includes these medications:   Oxycodone  (OxyIR)   **Note: The testing scope of this panel does not include the  following reported medications:   Albuterol  (Ventolin  HFA)  Aspirin   Azelastine (Astelin)  Duloxetine  (Cymbalta )  Folic Acid   Furosemide  (Lasix )  Hydroxychloroquine  (Plaquenil )  Hydroxyzine  (Vistaril )  Infliximab (Remicade)  Levothyroxine  (Synthroid )  Loratadine  (Claritin )  Methotrexate   Metoprolol  (Toprol )  Multivitamin  Ondansetron  (Zofran )  Rosuvastatin  (Crestor )  Spironolactone  (Aldactone )  Trazodone  (Desyrel )  Vitamin D  ==================================================================== For clinical consultation, please call [phone]. ====================================================================     No results found for: CBDTHCR No results found for: D8THCCBX No results found for: D9THCCBX  ROS   Constitutional: Denies any fever or chills Gastrointestinal: No reported hemesis, hematochezia, vomiting, or acute GI distress Musculoskeletal: Bilateral hand pain, feet pain, leg pain Neurological: No reported episodes of acute onset apraxia, aphasia, dysarthria, agnosia, amnesia, paralysis, loss of coordination, or loss of consciousness  Medication Review  Calcium  Carb-Cholecalciferol , DULoxetine , Magnesium, acetaminophen , aspirin  EC, calcium  carbonate, doxepin , esomeprazole, folic acid , furosemide , hydroxychloroquine , inFLIXimab, levothyroxine , loratadine , methotrexate , metoprolol  succinate, naloxone , nitrofurantoin (macrocrystal-monohydrate), nystatin cream, oxyCODONE , potassium chloride , predniSONE , and rosuvastatin   History Review  Allergy: Laura [name] is allergic to bupropion, jardiance [empagliflozin], and penicillins. Drug: Laura [name]  reports no history of drug use. Alcohol:  reports no history of alcohol use. Tobacco:  reports that she has never smoked. She has never used smokeless tobacco. Social: Laura [name]  reports that she has never smoked. She has never used smokeless tobacco. She reports that she does not drink alcohol and does not use drugs. Medical:  has a past medical history of Allergy, Anxiety,  Aortic atherosclerosis (HCC), Arthritis, degenerative (10/08/2013), Asthma, Cardiomegaly, Chronic kidney disease, stage 3 (HCC), Chronic, continuous use of opioids, Coronary artery disease involving native coronary artery of native heart without angina pectoris, DDD (degenerative disc disease), lumbar, Depression, Diet-controlled type 2 diabetes mellitus (HCC), Essential hypertension, GERD (gastroesophageal reflux disease), Heberden's nodes, HFrEF (heart failure with reduced ejection fraction) (HCC), History of bilateral cataract extraction, Hypothyroidism, Insomnia, Long term methotrexate  user, Long-term use of aspirin  therapy, Long-term use of hydroxychloroquine , Long-term use of  infliximab, 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 Surgical: Laura [name]  has a past surgical history that includes Abdominal hysterectomy; Cesarean section; Total knee arthroplasty (Left); Hip fracture surgery (Right); Orif pelvic fracture with percutaneous screws (Bilateral, 02/02/2022); Diagnostic laparoscopy; Cataract extraction w/ intraocular lens  implant, bilateral; and LEFT HEART CATH AND CORONARY ANGIOGRAPHY (Left, 11/24/2022). Family: family history includes Alcohol abuse in her father; Depression in her father and sister; Heart attack in her father; Hypertension in her mother and sister; Post-traumatic stress disorder in her father; Rheum arthritis in her sister; Stroke in her mother.  Laboratory Chemistry Profile   Renal Lab Results  Component Value Date   BUN 18 10/29/2022   CREATININE 0.88 10/29/2022   GFRAA 57 (L) 10/03/2019   GFRNONAA >60 10/29/2022    Hepatic Lab Results  Component Value Date   AST 13 (L) 02/04/2022   ALT 6 02/04/2022   ALBUMIN 2.7 (L) 02/04/2022   ALKPHOS 78 02/04/2022   LIPASE 22 01/14/2021    Electrolytes Lab Results  Component Value Date   NA 137 10/29/2022   K 4.1 10/29/2022   CL 100 10/29/2022   CALCIUM  9.1 10/29/2022   MG 2.3 10/08/2018    Bone Lab Results  Component Value Date   VD25OH See Scanned report in [facility] 02/03/2022   25OHVITD1 36 07/15/2015   25OHVITD2 3.6 07/15/2015   25OHVITD3 32 07/15/2015    Inflammation (CRP: Acute Phase) (ESR: Chronic Phase) Lab Results  Component Value Date   CRP 1.9  (H) 07/15/2015   ESRSEDRATE 32 (H) 07/15/2015   LATICACIDVEN 1.4 01/31/2022         Note: Above Lab results reviewed.  Recent Imaging Review  CARDIAC CATHETERIZATION   Prox RCA lesion is 50% stenosed.   Mid RCA lesion is 25% stenosed.   The left ventricular systolic function is normal.   LV end diastolic pressure is normal.   The left ventricular ejection fraction is 55-65% by visual estimate.   There is no mitral valve regurgitation.   In the absence of any other complications or medical issues, we expect  the patient to be ready for discharge from a cath perspective on  11/24/2022.   No indication for antiplatelet therapy at this time .  Conclusion  Outpatient left heart cath with coronaries possible PCI.  Left ventriculogram normal left ventricular function 55%  Coronaries Left main minor irregularities LAD large minor irregularities Circumflex minor irregularities RCA 50% proximal 25% mid Right dominant  Intervention deferred Not indicated Have patient follow-up in clinic with cardiology 1 to 2 weeks Note: Reviewed        Physical Exam  General appearance: Well nourished, well developed, and well hydrated. In no apparent acute  distress Mental status: Alert, oriented x 3 (person, place, & time)       Respiratory: No evidence of acute respiratory distress Eyes: PERLA Vitals: BP 131/86   Pulse 79   Temp (!) 97.3 F (36.3 C)   Ht 5' 5 (1.651 m)   Wt 248 lb (112.5 kg)   SpO2 99%   BMI 41.27 kg/m  BMI: Estimated body mass index is 41.27 kg/m as calculated from the following:   Height as of this encounter: 5' 5 (1.651 m).   Weight as of this encounter: 248 lb (112.5 kg). Ideal: Ideal body weight: 57 kg (125 lb 10.6 oz) Adjusted ideal body weight: 79.2 kg (174 lb 9.6 oz)  Assessment   Diagnosis Status  1. Chronic low back pain (1ry area of Pain) (Bilateral) (L>R) w/ sciatica (Bilateral)   2. Chronic lower extremity pain (2ry area of Pain) (Bilateral)    3. Lumbar Grade 1 Anterolisthesis of L3/L4 and L4/L5   4. 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 Lumbar lateral recess stenosis (Bilateral) (L2-3, L3-4, L4-5) (Severe: L3-4)   6. Ligamentum flavum hypertrophy (L1-2, L2-3, L3-4)   7. Lumbar foraminal stenosis (Bilateral L3-4 and L5-S1)   8. Lumbar facet syndrome (Bilateral) (L>R)   9. Chronic hip pain (Left)   10. Chronic knee pain (Right)   11. Chronic hand pain (Left)   12. Seronegative rheumatoid arthritis (HCC)   13. Generalized osteoarthritis of multiple sites   14. Chronic pain syndrome   15. Pharmacologic therapy   16. Chronic use of opiate for therapeutic purpose   17. Encounter for medication management   18. Encounter for chronic pain management    Controlled Controlled Controlled   Updated Problems: No problems updated.  Plan of Care  Problem-specific:  Assessment and Plan Will continue on current medication regimen.  Prescribing drug monitoring (PDMP) reviewed; findings consistent with the use of prescribed medication and no evidence of narcotic misuse or abuse.  Blood work done for drug screening.  Schedule follow-up in 90 days for medication management.  No other new issues or problems reported to this visit.   Laura [name] has a current medication list which includes the following long-term medication(s): calcium  carb-cholecalciferol , doxepin , duloxetine , infliximab, loratadine , metoprolol  succinate, [START ON 07/15/2023] oxycodone , [START ON 08/14/2023] oxycodone , and [START ON 09/13/2023] oxycodone .  Pharmacotherapy (Medications Ordered): Meds ordered this encounter  Medications   oxyCODONE  (OXY IR/ROXICODONE ) 5 MG immediate release tablet    Sig: Take 1 tablet (5 mg total) by mouth every 6 (six) hours as needed for severe pain (pain score 7-10). Must last 30 days    Dispense:  120 tablet    Refill:  0    DO NOT: delete (not duplicate); no partial-fill (will deny  script to complete), no refill request (F/U required). DISPENSE: 1 day early if closed on fill date. WARN: No CNS-depressants within 8 hrs of med.   oxyCODONE  (OXY IR/ROXICODONE ) 5 MG immediate release tablet    Sig: Take 1 tablet (5 mg total) by mouth every 6 (six) hours as needed for severe pain (pain score 7-10). Must last 30 days    Dispense:  120 tablet    Refill:  0    DO NOT: delete (not duplicate); no partial-fill (will deny script to complete), no refill request (F/U required). DISPENSE: 1 day early if closed on fill date. WARN: No CNS-depressants within 8 hrs of med.   oxyCODONE  (OXY IR/ROXICODONE )  5 MG immediate release tablet    Sig: Take 1 tablet (5 mg total) by mouth every 6 (six) hours as needed for severe pain (pain score 7-10). Must last 30 days    Dispense:  120 tablet    Refill:  0    DO NOT: delete (not duplicate); no partial-fill (will deny script to complete), no refill request (F/U required). DISPENSE: 1 day early if closed on fill date. WARN: No CNS-depressants within 8 hrs of med.   Orders:  Orders Placed This Encounter  Procedures   Drug Screen 10 W/Conf, Serum    Release to patient:   Immediate        Return in about 3 months (around 10/05/2023) for (F2F), (MM), Emmy Blanch NP.    Recent Visits Date Type Provider Dept  04/12/23 Office Visit Tanya Glisson, MD [facility]  Showing recent visits within past 90 days and meeting all other requirements Today's Visits Date Type Provider Dept  07/05/23 Office Visit Shyra Emile K, NP [facility]  Showing today's visits and meeting all other requirements Future Appointments No visits were found meeting these conditions. Showing future appointments within next 90 days and meeting all other requirements  I discussed the assessment and treatment plan with the patient. The patient was provided an opportunity to ask questions and all were answered. The patient agreed with the plan and  demonstrated an understanding of the instructions.  Patient advised to call back or seek an in-person evaluation if the symptoms or condition worsens.  Duration of encounter: 30 minutes.  Total time on encounter, as per AMA guidelines included both the face-to-face and non-face-to-face time personally spent by the physician and/or other qualified health care professional(s) on the day of the encounter (includes time in activities that require the physician or other qualified health care professional and does not include time in activities normally performed by clinical staff). Physician's time may include the following activities when performed: Preparing to see the patient (e.g., pre-charting review of records, searching for previously ordered imaging, lab work, and nerve conduction tests) Review of prior analgesic pharmacotherapies. Reviewing PMP Interpreting ordered tests (e.g., lab work, imaging, nerve conduction tests) Performing post-procedure evaluations, including interpretation of diagnostic procedures Obtaining and/or reviewing separately obtained history Performing a medically appropriate examination and/or evaluation Counseling and educating the patient/family/caregiver Ordering medications, tests, or procedures Referring and communicating with other health care professionals (when not separately reported) Documenting clinical information in the electronic or other health record Independently interpreting results (not separately reported) and communicating results to the patient/ family/caregiver Care coordination (not separately reported)  Note by: Arpan Eskelson K Jathan Balling, NP (TTS and AI technology used. I apologize for any typographical errors that were not detected and corrected.) Date: 07/05/2023; Time: 3:44 PM

## 2023-07-10 LAB — DRUG SCREEN 10 W/CONF, SERUM
Amphetamines, IA: NEGATIVE ng/mL
Barbiturates, IA: NEGATIVE ug/mL
Benzodiazepines, IA: NEGATIVE ng/mL
Cocaine & Metabolite, IA: NEGATIVE ng/mL
Methadone, IA: NEGATIVE ng/mL
Opiates, IA: NEGATIVE ng/mL
Phencyclidine, IA: NEGATIVE ng/mL
Propoxyphene, IA: NEGATIVE ng/mL
THC(Marijuana) Metabolite, IA: NEGATIVE ng/mL

## 2023-07-10 LAB — OXYCODONES,MS,WB/SP RFX

## 2023-07-29 ENCOUNTER — Other Ambulatory Visit: Payer: Self-pay | Admitting: Psychiatry

## 2023-07-29 DIAGNOSIS — F5101 Primary insomnia: Secondary | ICD-10-CM

## 2023-07-29 DIAGNOSIS — F330 Major depressive disorder, recurrent, mild: Secondary | ICD-10-CM

## 2023-08-07 ENCOUNTER — Other Ambulatory Visit: Payer: Self-pay | Admitting: Student

## 2023-08-07 DIAGNOSIS — M5412 Radiculopathy, cervical region: Secondary | ICD-10-CM

## 2023-08-07 DIAGNOSIS — M48062 Spinal stenosis, lumbar region with neurogenic claudication: Secondary | ICD-10-CM

## 2023-08-19 ENCOUNTER — Ambulatory Visit: Admission: RE | Admit: 2023-08-19 | Source: Ambulatory Visit

## 2023-08-19 ENCOUNTER — Ambulatory Visit

## 2023-08-27 ENCOUNTER — Ambulatory Visit
Admission: RE | Admit: 2023-08-27 | Discharge: 2023-08-27 | Disposition: A | Discharge status: Home / Self Care | Source: Ambulatory Visit | Attending: Student | Admitting: Student

## 2023-08-27 DIAGNOSIS — M48062 Spinal stenosis, lumbar region with neurogenic claudication: Secondary | ICD-10-CM | POA: Insufficient documentation

## 2023-08-27 DIAGNOSIS — M5412 Radiculopathy, cervical region: Secondary | ICD-10-CM | POA: Insufficient documentation

## 2023-09-26 ENCOUNTER — Telehealth: Admitting: Psychiatry

## 2023-09-27 NOTE — Progress Notes (Signed)
 Patient arrives for Remicade Infusion today; patient is very confused and weaker than normal, having difficulty transferring from [facility] to recliner w/ one assist.  This is a task that she is normally able to perform w/ one assist.  W/ further inquiry, caregiver states patient is scheduled for Urinalysis this afternoon; this was a request of patients daughter, Vertell who noticed the confusion this morning.  PA, Defoor made aware; advises to hold Remicade infusion at this time.

## 2023-09-28 ENCOUNTER — Emergency Department (HOSPITAL_COMMUNITY)

## 2023-09-28 ENCOUNTER — Inpatient Hospital Stay (HOSPITAL_COMMUNITY)
Admission: EM | Admit: 2023-09-28 | Discharge: 2023-10-04 | DRG: 981 | Disposition: A | Discharge status: Home Health | Attending: Internal Medicine | Admitting: Internal Medicine

## 2023-09-28 ENCOUNTER — Other Ambulatory Visit: Payer: Self-pay

## 2023-09-28 ENCOUNTER — Ambulatory Visit (HOSPITAL_BASED_OUTPATIENT_CLINIC_OR_DEPARTMENT_OTHER): Admitting: Nurse Practitioner

## 2023-09-28 ENCOUNTER — Encounter (HOSPITAL_COMMUNITY): Payer: Self-pay

## 2023-09-28 ENCOUNTER — Encounter: Payer: Self-pay | Admitting: Nurse Practitioner

## 2023-09-28 DIAGNOSIS — M47816 Spondylosis without myelopathy or radiculopathy, lumbar region: Secondary | ICD-10-CM

## 2023-09-28 DIAGNOSIS — L089 Local infection of the skin and subcutaneous tissue, unspecified: Secondary | ICD-10-CM | POA: Diagnosis not present

## 2023-09-28 DIAGNOSIS — M009 Pyogenic arthritis, unspecified: Secondary | ICD-10-CM | POA: Diagnosis present

## 2023-09-28 DIAGNOSIS — E782 Mixed hyperlipidemia: Secondary | ICD-10-CM | POA: Diagnosis present

## 2023-09-28 DIAGNOSIS — M65841 Other synovitis and tenosynovitis, right hand: Secondary | ICD-10-CM | POA: Diagnosis present

## 2023-09-28 DIAGNOSIS — Z993 Dependence on wheelchair: Secondary | ICD-10-CM

## 2023-09-28 DIAGNOSIS — E860 Dehydration: Secondary | ICD-10-CM | POA: Diagnosis present

## 2023-09-28 DIAGNOSIS — B9620 Unspecified Escherichia coli [E. coli] as the cause of diseases classified elsewhere: Secondary | ICD-10-CM | POA: Diagnosis present

## 2023-09-28 DIAGNOSIS — N183 Chronic kidney disease, stage 3 unspecified: Secondary | ICD-10-CM | POA: Diagnosis not present

## 2023-09-28 DIAGNOSIS — Z79631 Long term (current) use of antimetabolite agent: Secondary | ICD-10-CM

## 2023-09-28 DIAGNOSIS — G894 Chronic pain syndrome: Secondary | ICD-10-CM | POA: Diagnosis present

## 2023-09-28 DIAGNOSIS — E785 Hyperlipidemia, unspecified: Secondary | ICD-10-CM | POA: Diagnosis present

## 2023-09-28 DIAGNOSIS — Z79890 Hormone replacement therapy (postmenopausal): Secondary | ICD-10-CM

## 2023-09-28 DIAGNOSIS — Z87440 Personal history of urinary (tract) infections: Secondary | ICD-10-CM

## 2023-09-28 DIAGNOSIS — Z6839 Body mass index (BMI) 39.0-39.9, adult: Secondary | ICD-10-CM

## 2023-09-28 DIAGNOSIS — B9561 Methicillin susceptible Staphylococcus aureus infection as the cause of diseases classified elsewhere: Secondary | ICD-10-CM | POA: Diagnosis present

## 2023-09-28 DIAGNOSIS — E039 Hypothyroidism, unspecified: Secondary | ICD-10-CM | POA: Diagnosis present

## 2023-09-28 DIAGNOSIS — G9341 Metabolic encephalopathy: Secondary | ICD-10-CM | POA: Diagnosis present

## 2023-09-28 DIAGNOSIS — L03119 Cellulitis of unspecified part of limb: Secondary | ICD-10-CM

## 2023-09-28 DIAGNOSIS — F330 Major depressive disorder, recurrent, mild: Secondary | ICD-10-CM | POA: Diagnosis present

## 2023-09-28 DIAGNOSIS — I2510 Atherosclerotic heart disease of native coronary artery without angina pectoris: Secondary | ICD-10-CM | POA: Diagnosis present

## 2023-09-28 DIAGNOSIS — M159 Polyosteoarthritis, unspecified: Secondary | ICD-10-CM

## 2023-09-28 DIAGNOSIS — N390 Urinary tract infection, site not specified: Secondary | ICD-10-CM | POA: Diagnosis present

## 2023-09-28 DIAGNOSIS — M48062 Spinal stenosis, lumbar region with neurogenic claudication: Secondary | ICD-10-CM | POA: Diagnosis present

## 2023-09-28 DIAGNOSIS — R410 Disorientation, unspecified: Principal | ICD-10-CM

## 2023-09-28 DIAGNOSIS — D849 Immunodeficiency, unspecified: Secondary | ICD-10-CM | POA: Diagnosis present

## 2023-09-28 DIAGNOSIS — M4316 Spondylolisthesis, lumbar region: Secondary | ICD-10-CM

## 2023-09-28 DIAGNOSIS — M0600 Rheumatoid arthritis without rheumatoid factor, unspecified site: Secondary | ICD-10-CM

## 2023-09-28 DIAGNOSIS — E1122 Type 2 diabetes mellitus with diabetic chronic kidney disease: Secondary | ICD-10-CM | POA: Diagnosis present

## 2023-09-28 DIAGNOSIS — D84821 Immunodeficiency due to drugs: Secondary | ICD-10-CM | POA: Diagnosis present

## 2023-09-28 DIAGNOSIS — I130 Hypertensive heart and chronic kidney disease with heart failure and stage 1 through stage 4 chronic kidney disease, or unspecified chronic kidney disease: Secondary | ICD-10-CM | POA: Diagnosis present

## 2023-09-28 DIAGNOSIS — Z79899 Other long term (current) drug therapy: Secondary | ICD-10-CM

## 2023-09-28 DIAGNOSIS — R202 Paresthesia of skin: Secondary | ICD-10-CM | POA: Diagnosis present

## 2023-09-28 DIAGNOSIS — Z880 Allergy status to penicillin: Secondary | ICD-10-CM

## 2023-09-28 DIAGNOSIS — N1831 Chronic kidney disease, stage 3a: Secondary | ICD-10-CM | POA: Diagnosis present

## 2023-09-28 DIAGNOSIS — Z79891 Long term (current) use of opiate analgesic: Secondary | ICD-10-CM

## 2023-09-28 DIAGNOSIS — Z91199 Patient's noncompliance with other medical treatment and regimen due to unspecified reason: Secondary | ICD-10-CM

## 2023-09-28 DIAGNOSIS — J45909 Unspecified asthma, uncomplicated: Secondary | ICD-10-CM | POA: Diagnosis present

## 2023-09-28 DIAGNOSIS — Z888 Allergy status to other drugs, medicaments and biological substances status: Secondary | ICD-10-CM

## 2023-09-28 DIAGNOSIS — I10 Essential (primary) hypertension: Secondary | ICD-10-CM | POA: Diagnosis present

## 2023-09-28 DIAGNOSIS — Z8261 Family history of arthritis: Secondary | ICD-10-CM

## 2023-09-28 DIAGNOSIS — E66812 Obesity, class 2: Secondary | ICD-10-CM | POA: Diagnosis present

## 2023-09-28 DIAGNOSIS — F3340 Major depressive disorder, recurrent, in remission, unspecified: Secondary | ICD-10-CM | POA: Diagnosis not present

## 2023-09-28 DIAGNOSIS — Z8249 Family history of ischemic heart disease and other diseases of the circulatory system: Secondary | ICD-10-CM

## 2023-09-28 DIAGNOSIS — I480 Paroxysmal atrial fibrillation: Secondary | ICD-10-CM | POA: Diagnosis present

## 2023-09-28 DIAGNOSIS — A4901 Methicillin susceptible Staphylococcus aureus infection, unspecified site: Secondary | ICD-10-CM | POA: Diagnosis not present

## 2023-09-28 DIAGNOSIS — Z7982 Long term (current) use of aspirin: Secondary | ICD-10-CM

## 2023-09-28 DIAGNOSIS — L03113 Cellulitis of right upper limb: Secondary | ICD-10-CM | POA: Diagnosis not present

## 2023-09-28 DIAGNOSIS — Z9841 Cataract extraction status, right eye: Secondary | ICD-10-CM

## 2023-09-28 DIAGNOSIS — E119 Type 2 diabetes mellitus without complications: Secondary | ICD-10-CM

## 2023-09-28 DIAGNOSIS — N179 Acute kidney failure, unspecified: Secondary | ICD-10-CM | POA: Diagnosis present

## 2023-09-28 DIAGNOSIS — L03011 Cellulitis of right finger: Principal | ICD-10-CM | POA: Diagnosis present

## 2023-09-28 DIAGNOSIS — I5022 Chronic systolic (congestive) heart failure: Secondary | ICD-10-CM | POA: Diagnosis present

## 2023-09-28 DIAGNOSIS — Z823 Family history of stroke: Secondary | ICD-10-CM

## 2023-09-28 DIAGNOSIS — N3000 Acute cystitis without hematuria: Secondary | ICD-10-CM | POA: Diagnosis present

## 2023-09-28 DIAGNOSIS — M48061 Spinal stenosis, lumbar region without neurogenic claudication: Secondary | ICD-10-CM

## 2023-09-28 DIAGNOSIS — Z90710 Acquired absence of both cervix and uterus: Secondary | ICD-10-CM

## 2023-09-28 DIAGNOSIS — Z8614 Personal history of Methicillin resistant Staphylococcus aureus infection: Secondary | ICD-10-CM

## 2023-09-28 DIAGNOSIS — R200 Anesthesia of skin: Secondary | ICD-10-CM | POA: Diagnosis present

## 2023-09-28 DIAGNOSIS — M069 Rheumatoid arthritis, unspecified: Secondary | ICD-10-CM | POA: Diagnosis present

## 2023-09-28 DIAGNOSIS — M1990 Unspecified osteoarthritis, unspecified site: Secondary | ICD-10-CM | POA: Diagnosis present

## 2023-09-28 DIAGNOSIS — I509 Heart failure, unspecified: Secondary | ICD-10-CM | POA: Diagnosis not present

## 2023-09-28 DIAGNOSIS — Z811 Family history of alcohol abuse and dependence: Secondary | ICD-10-CM

## 2023-09-28 DIAGNOSIS — M2428 Disorder of ligament, vertebrae: Secondary | ICD-10-CM

## 2023-09-28 DIAGNOSIS — G8929 Other chronic pain: Secondary | ICD-10-CM

## 2023-09-28 DIAGNOSIS — Z9842 Cataract extraction status, left eye: Secondary | ICD-10-CM

## 2023-09-28 DIAGNOSIS — Z7952 Long term (current) use of systemic steroids: Secondary | ICD-10-CM

## 2023-09-28 DIAGNOSIS — Z818 Family history of other mental and behavioral disorders: Secondary | ICD-10-CM

## 2023-09-28 DIAGNOSIS — Z961 Presence of intraocular lens: Secondary | ICD-10-CM | POA: Diagnosis present

## 2023-09-28 DIAGNOSIS — Z96652 Presence of left artificial knee joint: Secondary | ICD-10-CM | POA: Diagnosis present

## 2023-09-28 DIAGNOSIS — K219 Gastro-esophageal reflux disease without esophagitis: Secondary | ICD-10-CM | POA: Diagnosis present

## 2023-09-28 DIAGNOSIS — F32A Depression, unspecified: Secondary | ICD-10-CM | POA: Diagnosis present

## 2023-09-28 DIAGNOSIS — M00041 Staphylococcal arthritis, right hand: Secondary | ICD-10-CM | POA: Diagnosis not present

## 2023-09-28 DIAGNOSIS — L02511 Cutaneous abscess of right hand: Secondary | ICD-10-CM

## 2023-09-28 DIAGNOSIS — G4733 Obstructive sleep apnea (adult) (pediatric): Secondary | ICD-10-CM | POA: Diagnosis present

## 2023-09-28 LAB — I-STAT CG4 LACTIC ACID, ED
Lactic Acid, Venous: 0.8 mmol/L (ref 0.5–1.9)
Lactic Acid, Venous: 1 mmol/L (ref 0.5–1.9)

## 2023-09-28 LAB — CBC WITH DIFFERENTIAL/PLATELET
Abs Immature Granulocytes: 0.03 K/uL (ref 0.00–0.07)
Basophils Absolute: 0.1 K/uL (ref 0.0–0.1)
Basophils Relative: 1 %
Eosinophils Absolute: 0.4 K/uL (ref 0.0–0.5)
Eosinophils Relative: 4 %
HCT: 38.5 % (ref 36.0–46.0)
Hemoglobin: 12 g/dL (ref 12.0–15.0)
Immature Granulocytes: 0 %
Lymphocytes Relative: 31 %
Lymphs Abs: 3 K/uL (ref 0.7–4.0)
MCH: 28.6 pg (ref 26.0–34.0)
MCHC: 31.2 g/dL (ref 30.0–36.0)
MCV: 91.9 fL (ref 80.0–100.0)
Monocytes Absolute: 0.6 K/uL (ref 0.1–1.0)
Monocytes Relative: 6 %
Neutro Abs: 5.7 K/uL (ref 1.7–7.7)
Neutrophils Relative %: 58 %
Platelets: 341 K/uL (ref 150–400)
RBC: 4.19 MIL/uL (ref 3.87–5.11)
RDW: 14.6 % (ref 11.5–15.5)
WBC: 9.8 K/uL (ref 4.0–10.5)
nRBC: 0 % (ref 0.0–0.2)

## 2023-09-28 LAB — COMPREHENSIVE METABOLIC PANEL WITH GFR
ALT: 8 U/L (ref 0–44)
AST: 14 U/L — ABNORMAL LOW (ref 15–41)
Albumin: 3.8 g/dL (ref 3.5–5.0)
Alkaline Phosphatase: 59 U/L (ref 38–126)
Anion gap: 13 (ref 5–15)
BUN: 30 mg/dL — ABNORMAL HIGH (ref 8–23)
CO2: 27 mmol/L (ref 22–32)
Calcium: 9.2 mg/dL (ref 8.9–10.3)
Chloride: 93 mmol/L — ABNORMAL LOW (ref 98–111)
Creatinine, Ser: 1.59 mg/dL — ABNORMAL HIGH (ref 0.44–1.00)
GFR, Estimated: 33 mL/min — ABNORMAL LOW (ref >60)
Glucose, Bld: 116 mg/dL — ABNORMAL HIGH (ref 70–99)
Potassium: 4.2 mmol/L (ref 3.5–5.1)
Sodium: 133 mmol/L — ABNORMAL LOW (ref 135–145)
Total Bilirubin: 0.6 mg/dL (ref 0.0–1.2)
Total Protein: 6.9 g/dL (ref 6.5–8.1)

## 2023-09-28 LAB — URINALYSIS, W/ REFLEX TO CULTURE (INFECTION SUSPECTED)
Bilirubin Urine: NEGATIVE
Glucose, UA: NEGATIVE mg/dL
Hgb urine dipstick: NEGATIVE
Ketones, ur: NEGATIVE mg/dL
Nitrite: NEGATIVE
Protein, ur: NEGATIVE mg/dL
Specific Gravity, Urine: 1.016 (ref 1.005–1.030)
WBC, UA: >50 WBC/hpf (ref 0–5)
pH: 5 (ref 5.0–8.0)

## 2023-09-28 LAB — MAGNESIUM: Magnesium: 2.2 mg/dL (ref 1.7–2.4)

## 2023-09-28 MED ORDER — ONDANSETRON HCL 4 MG/2ML IJ SOLN: 4.0000 mg | Freq: Four times a day (QID) | INTRAMUSCULAR | Status: DC | PRN

## 2023-09-28 MED ORDER — ACETAMINOPHEN 325 MG PO TABS
650.0000 mg | ORAL_TABLET | Freq: Four times a day (QID) | ORAL | Status: DC | PRN
  Administered 2023-09-30 – 2023-10-01 (×2): 650 mg via ORAL
  Filled 2023-09-28 (×6): qty 2

## 2023-09-28 MED ORDER — ACETAMINOPHEN 650 MG RE SUPP
650.0000 mg | Freq: Four times a day (QID) | RECTAL | Status: DC | PRN
  Filled 2023-09-28 (×4): qty 1

## 2023-09-28 MED ORDER — SODIUM CHLORIDE 0.9 % IV SOLN
1.0000 g | INTRAVENOUS | Status: DC
  Administered 2023-09-29 – 2023-10-01 (×3): 1 g via INTRAVENOUS
  Filled 2023-09-28 (×4): qty 10

## 2023-09-28 MED ORDER — VANCOMYCIN HCL 2000 MG/400ML IV SOLN
2000.0000 mg | Freq: Once | INTRAVENOUS | Status: AC
  Administered 2023-09-29: 2000 mg via INTRAVENOUS
  Filled 2023-09-28: qty 400

## 2023-09-28 MED ORDER — MELATONIN 3 MG PO TABS
3.0000 mg | ORAL_TABLET | Freq: Every evening | ORAL | Status: DC | PRN
  Filled 2023-09-28: qty 1

## 2023-09-28 MED ORDER — VANCOMYCIN HCL 1500 MG/300ML IV SOLN
1500.0000 mg | INTRAVENOUS | Status: DC
  Administered 2023-10-01: 1500 mg via INTRAVENOUS
  Filled 2023-09-28 (×2): qty 300

## 2023-09-28 MED ORDER — FENTANYL CITRATE PF 50 MCG/ML IJ SOSY: 12.5000 ug | PREFILLED_SYRINGE | INTRAMUSCULAR | Status: DC | PRN

## 2023-09-28 MED ORDER — CEFTRIAXONE SODIUM 1 G IJ SOLR
1.0000 g | Freq: Once | INTRAMUSCULAR | Status: AC
  Administered 2023-09-28: 1 g via INTRAVENOUS
  Filled 2023-09-28: qty 10

## 2023-09-28 MED ORDER — SODIUM CHLORIDE 0.9 % IV BOLUS
500.0000 mL | Freq: Once | INTRAVENOUS | Status: AC
  Administered 2023-09-28: 500 mL via INTRAVENOUS

## 2023-09-28 MED ORDER — SODIUM CHLORIDE 0.9 % IV SOLN: INTRAVENOUS | Status: AC

## 2023-09-28 MED ORDER — NALOXONE HCL 0.4 MG/ML IJ SOLN: 0.4000 mg | INTRAMUSCULAR | Status: DC | PRN

## 2023-09-28 NOTE — H&P (Signed)
 History and Physical      Laura [name] FMW:[id number] DOB: Apr 28, 1945 DOA: 09/28/2023; DOS: 09/28/2023  PCP: Laura Ophelia JINNY DOUGLAS, MD *** Patient coming from: home ***  I have personally briefly reviewed patient's old medical records in [facility]  Chief Complaint: ***  HPI: Laura [name] is a 78 y.o. female with medical history significant for *** who is admitted to [facility] on 09/28/2023 with *** after presenting from home*** to [facility] ED complaining of ***.    ***       ***   ED Course:  Vital signs in the ED were notable for the following: ***  Labs were notable for the following: ***  Per my interpretation, EKG in ED demonstrated the following:  ***  Imaging in the ED, per corresponding formal radiology read, was notable for the following:  ***  EDP d/w on-call hand surgeon, Dr. Murrell, who will formally consult and see pt in the AM, requesting interval iv abx. Dr. Murrell anticipates surgical I&D in the morning.   While in the ED, the following were administered: ***  Subsequently, the patient was admitted  ***  ***red    Review of Systems: As per HPI otherwise 10 point review of systems negative.   Past Medical History:  Diagnosis Date   Allergy    Anxiety    Aortic atherosclerosis (HCC)    Arthritis, degenerative 10/08/2013   Asthma    Cardiomegaly    Chronic kidney disease, stage 3 (HCC)    Chronic, continuous use of opioids    a.) followed by pain management; b.) has naloxone  Rx available   Coronary artery disease involving native coronary artery of native heart without angina pectoris    a.) MV 11/08/2022: mild ischemia in apical septal wall (TID 0.94); b.) LHC 11/24/2022: minor luminal irregs LM/LAD/LCx, 50% pRCA, 25% mRCA - med mgmt   DDD (degenerative disc disease), lumbar    Depression    Diet-controlled type 2 diabetes mellitus (HCC)    Essential hypertension    GERD (gastroesophageal reflux disease)    Heberden's nodes     HFrEF (heart failure with reduced ejection fraction) (HCC)    a.) TTE 10/09/2018: EF 55-60%, no RWMAS, mild RVE, mild BAE, triv MR/TR, RVSP 48.7; b.) TTE 03/18/2021: EF >55%, no RWMAs, G!DD, triv AR/TR/PR, mild MR   History of bilateral cataract extraction    Hypothyroidism    Insomnia    Long term methotrexate  user    Long-term use of aspirin  therapy    Long-term use of hydroxychloroquine     Long-term use of infliximab    Lumbar spinal stenosis with neurogenic claudication 11/07/2014   Memory loss    Memory loss, short term 03/19/2014   Mixed hyperlipidemia    MRSA infection    upper lip   OSA on CPAP    Osteoarthritis    Paroxysmal atrial fibrillation (HCC)    a.) occurred in setting of PNA and hypokalemia; no recurrence   Pneumonia    Pulmonary hypertension (HCC)    a.) TTE 10/09/2018: RVSP 48.7 mmHg   Renal cyst, left    Rheumatoid arthritis (HCC)    a.) on hydryoxychloroqine + MTX + infliximab   Sciatica of left side 12/31/2019   Seizure (HCC) 10/07/2014   one time episode from wellbutrin   Stool incontinence 10/2022   Umbilical hernia    Urinary incontinence    Vitamin D  deficiency     Past Surgical History:  Procedure Laterality Date  ABDOMINAL HYSTERECTOMY     CATARACT EXTRACTION W/ INTRAOCULAR LENS  IMPLANT, BILATERAL     CESAREAN SECTION     x2   DIAGNOSTIC LAPAROSCOPY     HIP FRACTURE SURGERY Right    LEFT HEART CATH AND CORONARY ANGIOGRAPHY Left 11/24/2022   Procedure: LEFT HEART CATH AND CORONARY ANGIOGRAPHY;  Surgeon: Florencio Cara BIRCH, MD;  Location: [facility] INVASIVE CV LAB;  Service: Cardiovascular;  Laterality: Left;   ORIF PELVIC FRACTURE WITH PERCUTANEOUS SCREWS Bilateral 02/02/2022   Procedure: ORIF PELVIC FRACTURE WITH PERCUTANEOUS SCREWS;  Surgeon: Kendal Franky SQUIBB, MD;  Location: [facility] OR;  Service: Orthopedics;  Laterality: Bilateral;   TOTAL KNEE ARTHROPLASTY Left     Social History:  reports that she has never smoked. She has never used  smokeless tobacco. She reports that she does not drink alcohol and does not use drugs.   Allergies  Allergen Reactions   Bupropion Other (See Comments)    Siezures   Jardiance [Empagliflozin] Other (See Comments)    Projectile vomiting   Penicillins Nausea And Vomiting and Rash    Family History  Problem Relation Age of Onset   Hypertension Mother    Stroke Mother    Heart attack Father    Alcohol abuse Father    Depression Father    Post-traumatic stress disorder Father    Rheum arthritis Sister    Hypertension Sister    Depression Sister    Breast cancer Neg Hx     Family history reviewed and not pertinent ***   Prior to Admission medications   Medication Sig Start Date End Date Taking? Authorizing Provider  acetaminophen  (TYLENOL ) 500 MG tablet Take 500-1,000 mg by mouth every 6 (six) hours as needed for moderate pain (pain score 4-6).    [provider]  aspirin  EC 81 MG tablet Take 81 mg by mouth daily. Swallow whole.    [provider]  Calcium  Carb-Cholecalciferol  (CALCIUM  600 + D PO) Take 2 tablets by mouth daily.    [provider]  calcium  carbonate (TUMS - DOSED IN MG ELEMENTAL CALCIUM ) 500 MG chewable tablet Chew 1 tablet by mouth daily as needed for heartburn.    [provider]  doxepin  (SINEQUAN ) 50 MG capsule TAKE 1 CAPSULE (50 MG TOTAL) BY MOUTH AT BEDTIME. STOP 25 MG - DOSE INCREASE 07/30/23   Eappen, Saramma, MD  DULoxetine  (CYMBALTA ) 60 MG capsule Take 1 capsule (60 mg total) by mouth daily. Stop 20 mg , dose change 05/12/23   Eappen, Saramma, MD  esomeprazole (NEXIUM) 20 MG capsule Take 20 mg by mouth daily.    [provider]  folic acid  (FOLVITE ) 1 MG tablet Take 1 mg by mouth daily.    [provider]  furosemide  (LASIX ) 80 MG tablet Take 80 mg by mouth every Monday, Wednesday, and Friday.    [provider]  hydroxychloroquine  (PLAQUENIL ) 200 MG tablet Take 200 mg by mouth daily.    [provider]  inFLIXimab (REMICADE) 100 MG injection Inject 100 mg into the vein every 8 (eight) weeks.    [provider]  levothyroxine  (SYNTHROID ) 125 MCG tablet Take 250 mcg by mouth daily before breakfast.    [provider]  loratadine  (CLARITIN ) 10 MG tablet Take 10 mg by mouth daily.  04/05/14   [provider]  MAGNESIUM PO Take 1 capsule by mouth daily. With vitamin D     [provider]  methotrexate  (RHEUMATREX) 2.5 MG tablet Take 15 mg  by mouth every Sunday.    [provider]  metoprolol  succinate (TOPROL -XL) 100 MG 24 hr tablet Take 1 tablet (100 mg total) by mouth daily. Take with or immediately following a meal. 10/10/18   Mayo, Rockie Overly, MD  naloxone  (NARCAN ) nasal spray 4 mg/0.1 mL Place 1 spray into the nose as needed for up to 365 doses (for opioid-induced respiratory depresssion). In case of emergency (overdose), spray once into each nostril. If no response within 3 minutes, repeat application and call 911. 10/12/22 10/12/23  Tanya Glisson, MD  nitrofurantoin, macrocrystal-monohydrate, (MACROBID) 100 MG capsule Take 100 mg by mouth 2 (two) times daily. 02/09/23   [provider]  nystatin cream (MYCOSTATIN) Apply topically. 03/22/23 03/21/24  [provider]  oxyCODONE  (OXY IR/ROXICODONE ) 5 MG immediate release tablet Take 1 tablet (5 mg total) by mouth every 6 (six) hours as needed for severe pain (pain score 7-10). Must last 30 days 07/15/23 08/14/23  Patel, Seema K, NP  oxyCODONE  (OXY IR/ROXICODONE ) 5 MG immediate release tablet Take 1 tablet (5 mg total) by mouth every 6 (six) hours as needed for severe pain (pain score 7-10). Must last 30 days 08/14/23 09/13/23  Patel, Seema K, NP  oxyCODONE  (OXY IR/ROXICODONE ) 5 MG immediate release tablet Take 1 tablet (5 mg total) by mouth every 6 (six) hours as needed for severe pain (pain score 7-10). Must last 30 days 09/13/23 10/13/23  Patel, Seema K, NP  potassium chloride  (KLOR-CON )  10 MEQ tablet Take 10 mEq by mouth every Monday, Wednesday, and Friday.    [provider]  predniSONE  (DELTASONE ) 5 MG tablet Take by mouth. 05/15/23   [provider]  rosuvastatin  (CRESTOR ) 5 MG tablet Take 5 mg by mouth 2 (two) times a week. (Sunday and Friday)    [provider]     Objective    Physical Exam: Vitals:   09/28/23 1642 09/28/23 2004 09/28/23 2030 09/28/23 2130  BP:  93/73 133/68 (!) 137/59  Pulse:  71 70 77  Resp:  18 20 20   Temp:  98.5 F (36.9 C)    SpO2:  98% 100% 95%  Weight: 108.9 kg     Height: 5' 5 (1.651 m)       General: appears to be stated age; alert, oriented Skin: warm, dry, no rash Head:  AT/[place] Mouth:  Oral mucosa membranes appear moist, normal dentition Neck: supple; trachea midline Heart:  RRR; did not appreciate any M/R/G Lungs: CTAB, did not appreciate any wheezes, rales, or rhonchi Abdomen: + BS; soft, ND, NT Vascular: 2+ pedal pulses b/l; 2+ radial pulses b/l Extremities: no peripheral edema, no muscle wasting Neuro: strength and sensation intact in upper and lower extremities b/l ***   *** Neuro: 5/5 strength of the proximal and distal flexors and extensors of the upper and lower extremities bilaterally; sensation intact in upper and lower extremities b/l; cranial nerves II through XII grossly intact; no pronator drift; no evidence suggestive of slurred speech, dysarthria, or facial droop; Normal muscle tone. No tremors.  *** Neuro: In the setting of the patient's current mental status and associated inability to follow instructions, unable to perform full neurologic exam at this time.  As such, assessment of strength, sensation, and cranial nerves is limited at this time. Patient noted to spontaneously move all 4 extremities. No tremors.  ***    Labs on Admission: I have personally reviewed following labs and imaging studies  CBC: Recent Labs  Lab 09/28/23 1717  WBC  9.8  NEUTROABS 5.7  HGB 12.0   HCT 38.5  MCV 91.9  PLT 341   Basic Metabolic Panel: Recent Labs  Lab 09/28/23 1717  NA 133*  K 4.2  CL 93*  CO2 27  GLUCOSE 116*  BUN 30*  CREATININE 1.59*  CALCIUM  9.2   GFR: Estimated Creatinine Clearance: 35.2 mL/min (A) (by C-G formula based on SCr of 1.59 mg/dL (H)). Liver Function Tests: Recent Labs  Lab 09/28/23 1717  AST 14*  ALT 8  ALKPHOS 59  BILITOT 0.6  PROT 6.9  ALBUMIN 3.8   No results for input(s): LIPASE, AMYLASE in the last 168 hours. No results for input(s): AMMONIA in the last 168 hours. Coagulation Profile: No results for input(s): INR, PROTIME in the last 168 hours. Cardiac Enzymes: No results for input(s): CKTOTAL, CKMB, CKMBINDEX, TROPONINI in the last 168 hours. BNP (last 3 results) No results for input(s): PROBNP in the last 8760 hours. HbA1C: No results for input(s): HGBA1C in the last 72 hours. CBG: No results for input(s): GLUCAP in the last 168 hours. Lipid Profile: No results for input(s): CHOL, HDL, LDLCALC, TRIG, CHOLHDL, LDLDIRECT in the last 72 hours. Thyroid Function Tests: No results for input(s): TSH, T4TOTAL, FREET4, T3FREE, THYROIDAB in the last 72 hours. Anemia Panel: No results for input(s): VITAMINB12, FOLATE, FERRITIN, TIBC, IRON, RETICCTPCT in the last 72 hours. Urine analysis:    Component Value Date/Time   COLORURINE YELLOW 09/28/2023 2055   APPEARANCEUR CLOUDY (A) 09/28/2023 2055   LABSPEC 1.016 09/28/2023 2055   PHURINE 5.0 09/28/2023 2055   GLUCOSEU NEGATIVE 09/28/2023 2055   HGBUR NEGATIVE 09/28/2023 2055   BILIRUBINUR NEGATIVE 09/28/2023 2055   KETONESUR NEGATIVE 09/28/2023 2055   PROTEINUR NEGATIVE 09/28/2023 2055   NITRITE NEGATIVE 09/28/2023 2055   LEUKOCYTESUR MODERATE (A) 09/28/2023 2055    Radiological Exams on Admission: CT Head Wo Contrast Result Date: 09/28/2023 CLINICAL DATA:  Altered level of consciousness EXAM: CT HEAD WITHOUT  CONTRAST TECHNIQUE: Contiguous axial images were obtained from the base of the skull through the vertex without intravenous contrast. RADIATION DOSE REDUCTION: This exam was performed according to the departmental dose-optimization program which includes automated exposure control, adjustment of the mA and/or kV according to patient size and/or use of iterative reconstruction technique. COMPARISON:  10/28/2022 FINDINGS: Brain: Confluent hypodensities throughout the periventricular white matter most consistent with chronic small vessel ischemic changes. No evidence of acute infarct or hemorrhage. Lateral ventricles and midline structures appear unremarkable. No acute extra-axial fluid collections. No mass effect. Vascular: No hyperdense vessel or unexpected calcification. Skull: Normal. Negative for fracture or focal lesion. Sinuses/Orbits: No acute finding. Other: None. IMPRESSION: 1. No acute intracranial process. Chronic small vessel ischemic changes as above. Electronically Signed   By: Ozell Daring M.D.   On: 09/28/2023 22:46   DG Finger Middle Right Result Date: 09/28/2023 CLINICAL DATA:  Swelling and erythema EXAM: RIGHT MIDDLE FINGER 2+V COMPARISON:  None Available. FINDINGS: Frontal, oblique, and lateral views of the right third digit are obtained. There are no acute or destructive bony abnormalities. Severe joint space narrowing and osteophyte formation of the distal interphalangeal joint compatible with osteoarthritis. There is diffuse soft tissue swelling, with no subcutaneous gas or radiopaque foreign body identified. IMPRESSION: 1. Soft tissue swelling, with no evidence of radiopaque foreign body or soft tissue gas. 2. No radiographic evidence of osteomyelitis. 3. Severe osteoarthritis. Electronically Signed   By: Ozell Daring M.D.   On: 09/28/2023 18:46  Assessment/Plan   Principal Problem:   Cellulitis of right upper  extremity   ***            ***                  ***                   ***                  ***                  ***                  ***                   ***                  ***                  ***                  ***                  ***                 ***                ***  DVT prophylaxis: SCD's ***  Code Status: Full code*** Family Communication: none*** Disposition Plan: Per Rounding Team Consults called: EDP d/w on-call hand surgeon, Dr. Murrell, who will formally consult and see pt in the AM, requesting interval iv abx. ;  Admission status: ***     I SPENT GREATER THAN 75 *** MINUTES IN CLINICAL CARE TIME/MEDICAL DECISION-MAKING IN COMPLETING THIS ADMISSION.      Eva NOVAK Kyung Muto DO [facility]  From 7PM - 7AM   09/28/2023, 11:42 PM   ***

## 2023-09-28 NOTE — ED Provider Notes (Signed)
 Emergency Department Provider Note   I have reviewed the triage vital signs and the nursing notes.   HISTORY  Chief Complaint Finger Injury   HPI Laura [name] is a 78 y.o. female past history reviewed below including RA, CKD, hypertension presents to the emergency department with her daughter at bedside for right finger swelling, increasing confusion, fatigue.  Patient lives with her husband but has significant medical needs.  She wears a diaper at baseline.  She has had urinary tract infections in the past which presented as confusion.  The daughter has noticed progressive confusion, worse at night, for the past several days.  She went to check on her today and noticed that her right third finger is red and swollen.  She decided to present to the ED.  While waiting in the waiting room she noticed worsening swelling and redness now progressing to the back of the hand on the right. Last PO was 7:30 PM.    Past Medical History:  Diagnosis Date   Allergy    Anxiety    Aortic atherosclerosis (HCC)    Arthritis, degenerative 10/08/2013   Asthma    Cardiomegaly    Chronic kidney disease, stage 3 (HCC)    Chronic, continuous use of opioids    a.) followed by pain management; b.) has naloxone  Rx available   Coronary artery disease involving native coronary artery of native heart without angina pectoris    a.) MV 11/08/2022: mild ischemia in apical septal wall (TID 0.94); b.) LHC 11/24/2022: minor luminal irregs LM/LAD/LCx, 50% pRCA, 25% mRCA - med mgmt   DDD (degenerative disc disease), lumbar    Depression    Diet-controlled type 2 diabetes mellitus (HCC)    Essential hypertension    GERD (gastroesophageal reflux disease)    Heberden's nodes    HFrEF (heart failure with reduced ejection fraction) (HCC)    a.) TTE 10/09/2018: EF 55-60%, no RWMAS, mild RVE, mild BAE, triv MR/TR, RVSP 48.7; b.) TTE 03/18/2021: EF >55%, no RWMAs, G!DD, triv AR/TR/PR, mild MR   History of bilateral  cataract extraction    Hypothyroidism    Insomnia    Kenyah Luba term methotrexate  user    [place]-term use of aspirin  therapy    Euell Schiff-term use of hydroxychloroquine     Raciel Caffrey-term use of infliximab    Lumbar spinal stenosis with neurogenic claudication 11/07/2014   Memory loss    Memory loss, short term 03/19/2014   Mixed hyperlipidemia    MRSA infection    upper lip   OSA on CPAP    Osteoarthritis    Paroxysmal atrial fibrillation (HCC)    a.) occurred in setting of PNA and hypokalemia; no recurrence   Pneumonia    Pulmonary hypertension (HCC)    a.) TTE 10/09/2018: RVSP 48.7 mmHg   Renal cyst, left    Rheumatoid arthritis (HCC)    a.) on hydryoxychloroqine + MTX + infliximab   Sciatica of left side 12/31/2019   Seizure (HCC) 10/07/2014   one time episode from wellbutrin   Stool incontinence 10/2022   Umbilical hernia    Urinary incontinence    Vitamin D  deficiency     Review of Systems  Constitutional: No fever/chills Cardiovascular: Denies chest pain. Respiratory: Denies shortness of breath. Gastrointestinal: No abdominal pain.  No nausea, no vomiting.   Musculoskeletal: Erythema/swelling to the right 3rd digit.  Skin: Negative for rash. Neurological: Negative for headaches.  ____________________________________________   PHYSICAL EXAM:  VITAL SIGNS: ED Triage Vitals  Encounter Vitals  Group     BP 09/28/23 1441 (!) 134/106     Pulse Rate 09/28/23 1441 75     Resp 09/28/23 1441 18     Temp 09/28/23 1441 99.3 F (37.4 C)     Temp src --      SpO2 09/28/23 1441 98 %     Weight 09/28/23 1642 240 lb (108.9 kg)     Height 09/28/23 1642 5' 5 (1.651 m)    Constitutional: Alert with some confusion noted. Well appearing and in no acute distress. Eyes: Conjunctivae are normal. Head: Atraumatic. Nose: No congestion/rhinnorhea. Mouth/Throat: Mucous membranes are moist.  Neck: No stridor.  Cardiovascular: Normal rate, regular rhythm. Good peripheral circulation. Grossly  normal heart sounds.   Respiratory: Normal respiratory effort.  No retractions. Lungs CTAB. Gastrointestinal: Soft and nontender. No distention.  Musculoskeletal: Erythema distally to the right third digit with some more mild erythema extending along the extensor aspect of the finger and slightly into the dorsal aspect of the hand.  No active drainage.  Point tenderness over the DIP.  Neurologic:  Normal speech and language. No gross focal neurologic deficits are appreciated.  Skin:  Skin is warm, dry and intact. No rash noted.  ____________________________________________   LABS (all labs ordered are listed, but only abnormal results are displayed)  Labs Reviewed  COMPREHENSIVE METABOLIC PANEL WITH GFR - Abnormal; Notable for the following components:      Result Value   Sodium 133 (*)    Chloride 93 (*)    Glucose, Bld 116 (*)    BUN 30 (*)    Creatinine, Ser 1.59 (*)    AST 14 (*)    GFR, Estimated 33 (*)    All other components within normal limits  URINALYSIS, W/ REFLEX TO CULTURE (INFECTION SUSPECTED) - Abnormal; Notable for the following components:   APPearance CLOUDY (*)    Leukocytes,Ua MODERATE (*)    Bacteria, UA MANY (*)    Non Squamous Epithelial 0-5 (*)    All other components within normal limits  URINE CULTURE  CBC WITH DIFFERENTIAL/PLATELET  I-STAT CG4 LACTIC ACID, ED  I-STAT CG4 LACTIC ACID, ED   ____________________________________________  RADIOLOGY  DG Finger Middle Right Result Date: 09/28/2023 CLINICAL DATA:  Swelling and erythema EXAM: RIGHT MIDDLE FINGER 2+V COMPARISON:  None Available. FINDINGS: Frontal, oblique, and lateral views of the right third digit are obtained. There are no acute or destructive bony abnormalities. Severe joint space narrowing and osteophyte formation of the distal interphalangeal joint compatible with osteoarthritis. There is diffuse soft tissue swelling, with no subcutaneous gas or radiopaque foreign body identified.  IMPRESSION: 1. Soft tissue swelling, with no evidence of radiopaque foreign body or soft tissue gas. 2. No radiographic evidence of osteomyelitis. 3. Severe osteoarthritis. Electronically Signed   By: Ozell Daring M.D.   On: 09/28/2023 18:46    ____________________________________________   PROCEDURES  Procedure(s) performed:   Procedures  None  ____________________________________________   INITIAL IMPRESSION / ASSESSMENT AND PLAN / ED COURSE  Pertinent labs & imaging results that were available during my care of the patient were reviewed by me and considered in my medical decision making (see chart for details).   This patient is Presenting for Evaluation of AMS, which does require a range of treatment options, and is a complaint that involves a high risk of morbidity and mortality.  The Differential Diagnoses includes but is not exclusive to alcohol, illicit or prescription medications, intracranial pathology such as stroke, intracerebral hemorrhage,  fever or infectious causes including sepsis, hypoxemia, uremia, trauma, endocrine related disorders such as diabetes, hypoglycemia, thyroid-related diseases, etc.   Critical Interventions-    Medications  cefTRIAXone  (ROCEPHIN ) 1 g in sodium chloride  0.9 % 100 mL IVPB (1 g Intravenous New Bag/Given 09/28/23 2152)  sodium chloride  0.9 % bolus 500 mL (500 mLs Intravenous New Bag/Given 09/28/23 2108)    Reassessment after intervention: symptoms unchanged.    I did obtain Additional Historical Information from daughter at bedside.    Clinical Laboratory Tests Ordered, included CBC without leukocytosis.  Lactic acid normal.  Creatinine mildly elevated at 1.59.  UA with leukocytes and many bacteria.  Negative nitrite.  Radiologic Tests Ordered, included finger XR and CT head. I independently interpreted the images and agree with radiology interpretation.   Cardiac Monitor Tracing which shows NSR.    Social Determinants of Health  Risk patient is a non-smoker.   Consult complete with Dr. Kuzma. Plan for NPO after midnight   Medical Decision Making: Summary:  Patient presents to the emergency department with altered mental status and finger swelling.  She is at high risk UTI.   Reevaluation with update and discussion with   ***Considered admission***  Patient's presentation is most consistent with {EM COPA:27473}   Disposition:   ____________________________________________  FINAL CLINICAL IMPRESSION(S) / ED DIAGNOSES  Final diagnoses:  None     NEW OUTPATIENT MEDICATIONS STARTED DURING THIS VISIT:  New Prescriptions   No medications on file    Note:  This document was prepared using Dragon voice recognition software and may include unintentional dictation errors.  Fonda Law, MD, [facility]

## 2023-09-28 NOTE — Progress Notes (Signed)
 Neurologic Problem X several weeks Pt's daughter states that pt has been more confused. Having hallucinations - seeing dead cousin next to her Daughter thinks she might've a UTI? Denies dysuria H/o bladder incontinence Denies HA, vision changes, CP or SOB Denies low abd/back pain Denies hematuria  Finger Pain X this am C/o R middle finger swelling and redness No drainage H/o HA, currently doing infusions Reports aching pain  SENT TO ED FOR EVAL  CONCERN FOR ALTERED MENTAL STATIS.  ESP WITH HALLUCINATION.

## 2023-09-28 NOTE — Progress Notes (Signed)
 Pharmacy Antibiotic Note  Laura [name] is a 78 y.o. female admitted on 09/28/2023 with right finger infection.  Pharmacy has been consulted for Vancomycin   dosing.  Plan: Vancomycin  2000 mg IV now, then 1500 mg IV q48h  Height: 5' 5 (165.1 cm) Weight: 108.9 kg (240 lb) IBW/kg (Calculated) : 57  Temp (24hrs), Avg:98.6 F (37 C), Min:98 F (36.7 C), Max:99.3 F (37.4 C)  Recent Labs  Lab 09/28/23 1711 09/28/23 1717 09/28/23 2115  WBC  --  9.8  --   CREATININE  --  1.59*  --   LATICACIDVEN 0.8  --  1.0    Estimated Creatinine Clearance: 35.2 mL/min (A) (by C-G formula based on SCr of 1.59 mg/dL (H)).    Allergies  Allergen Reactions   Bupropion Other (See Comments)    Siezures   Jardiance [Empagliflozin] Other (See Comments)    Projectile vomiting   Penicillins Nausea And Vomiting and Rash     Laura [name] 09/28/2023 11:50 PM

## 2023-09-28 NOTE — ED Triage Notes (Signed)
 Rt. Middle finger is red, warm and swollen, redness is noted to the rt. Dorsal and into wrist.  She is able to move the finger,  denies any injury.   Daughter also is concerned pt has an uti,  increased confusion .  Pt. Is alert and oriented X3.

## 2023-09-28 NOTE — ED Notes (Addendum)
 Patient transported to CT

## 2023-09-28 NOTE — ED Provider Triage Note (Signed)
 Emergency Medicine Provider Triage Evaluation Note  Laura [name] , a 78 y.o. female  was evaluated in triage.  Pt complains of right middle finger swelling with progressing erythema.  Review of Systems  Positive: Finger swelling erythema, confusion Negative: Shortness of breath, fevers, weakness, nausea vomiting  Physical Exam  BP (!) 134/106 (BP Location: Left Arm)   Pulse 75   Temp 99.3 F (37.4 C)   Resp 18   Ht 5' 5 (1.651 m)   Wt 108.9 kg   SpO2 98%   BMI 39.94 kg/m  Gen:   Awake, no distress   Resp:  Normal effort  MSK:   Erythema and edema on right middle finger, mild erythema with middle finger Other:  Reported confusion  Medical Decision Making  Medically screening exam initiated at 5:34 PM.  Appropriate orders placed.  Laura [name] was informed that the remainder of the evaluation will be completed by another provider, this initial triage assessment does not replace that evaluation, and the importance of remaining in the ED until their evaluation is complete.  78 year old female presents to ED with complaints of right middle finger swelling x 1 day and left finger erythema x 1 hour.  Family reports patient has also had some mild confusion in the last week and they have some concern for UTI.  Patient has poor bladder control and will require catheter for UA.  Right finger has progressed in erythema and swelling in the last 24 hours.  Patient has history of rheumatoid arthritis no history of gout.  Patient does have history of spinal stenosis with neuro follow-up.   Laura [name] RAMAN, [place] 09/28/23 1750

## 2023-09-28 NOTE — Progress Notes (Signed)
 09/28/2023-No show

## 2023-09-28 NOTE — ED Triage Notes (Signed)
 Patient reports she has finger infection and needs to be seen for that.

## 2023-09-28 NOTE — ED Notes (Signed)
 Patient's daughter reports patient took 5mg  oxycodone  PO at 1755 from home meds.

## 2023-09-29 ENCOUNTER — Encounter (HOSPITAL_COMMUNITY): Payer: Self-pay | Admitting: Internal Medicine

## 2023-09-29 ENCOUNTER — Other Ambulatory Visit: Payer: Self-pay

## 2023-09-29 ENCOUNTER — Encounter (HOSPITAL_COMMUNITY)
Admission: EM | Disposition: A | Discharge status: Home Health | Payer: Self-pay | Source: Home / Self Care | Attending: Internal Medicine

## 2023-09-29 ENCOUNTER — Inpatient Hospital Stay (HOSPITAL_COMMUNITY): Admitting: Anesthesiology

## 2023-09-29 DIAGNOSIS — N3000 Acute cystitis without hematuria: Secondary | ICD-10-CM | POA: Diagnosis present

## 2023-09-29 DIAGNOSIS — N183 Chronic kidney disease, stage 3 unspecified: Secondary | ICD-10-CM

## 2023-09-29 DIAGNOSIS — N179 Acute kidney failure, unspecified: Secondary | ICD-10-CM | POA: Diagnosis present

## 2023-09-29 DIAGNOSIS — L089 Local infection of the skin and subcutaneous tissue, unspecified: Secondary | ICD-10-CM | POA: Diagnosis not present

## 2023-09-29 DIAGNOSIS — I509 Heart failure, unspecified: Secondary | ICD-10-CM

## 2023-09-29 DIAGNOSIS — N1831 Chronic kidney disease, stage 3a: Secondary | ICD-10-CM | POA: Diagnosis present

## 2023-09-29 DIAGNOSIS — I130 Hypertensive heart and chronic kidney disease with heart failure and stage 1 through stage 4 chronic kidney disease, or unspecified chronic kidney disease: Secondary | ICD-10-CM | POA: Diagnosis not present

## 2023-09-29 DIAGNOSIS — G9341 Metabolic encephalopathy: Secondary | ICD-10-CM | POA: Diagnosis present

## 2023-09-29 LAB — CBC WITH DIFFERENTIAL/PLATELET
Abs Immature Granulocytes: 0.04 K/uL (ref 0.00–0.07)
Basophils Absolute: 0.1 K/uL (ref 0.0–0.1)
Basophils Relative: 1 %
Eosinophils Absolute: 0.4 K/uL (ref 0.0–0.5)
Eosinophils Relative: 4 %
HCT: 34.7 % — ABNORMAL LOW (ref 36.0–46.0)
Hemoglobin: 11.2 g/dL — ABNORMAL LOW (ref 12.0–15.0)
Immature Granulocytes: 0 %
Lymphocytes Relative: 23 %
Lymphs Abs: 2.4 K/uL (ref 0.7–4.0)
MCH: 29.1 pg (ref 26.0–34.0)
MCHC: 32.3 g/dL (ref 30.0–36.0)
MCV: 90.1 fL (ref 80.0–100.0)
Monocytes Absolute: 0.8 K/uL (ref 0.1–1.0)
Monocytes Relative: 7 %
Neutro Abs: 6.7 K/uL (ref 1.7–7.7)
Neutrophils Relative %: 65 %
Platelets: 317 K/uL (ref 150–400)
RBC: 3.85 MIL/uL — ABNORMAL LOW (ref 3.87–5.11)
RDW: 14.5 % (ref 11.5–15.5)
WBC: 10.4 K/uL (ref 4.0–10.5)
nRBC: 0 % (ref 0.0–0.2)

## 2023-09-29 LAB — PROTIME-INR
INR: 1 (ref 0.8–1.2)
Prothrombin Time: 13.7 s (ref 11.4–15.2)

## 2023-09-29 LAB — COMPREHENSIVE METABOLIC PANEL WITH GFR
ALT: 8 U/L (ref 0–44)
AST: 12 U/L — ABNORMAL LOW (ref 15–41)
Albumin: 3.5 g/dL (ref 3.5–5.0)
Alkaline Phosphatase: 53 U/L (ref 38–126)
Anion gap: 19 — ABNORMAL HIGH (ref 5–15)
BUN: 30 mg/dL — ABNORMAL HIGH (ref 8–23)
CO2: 21 mmol/L — ABNORMAL LOW (ref 22–32)
Calcium: 9 mg/dL (ref 8.9–10.3)
Chloride: 97 mmol/L — ABNORMAL LOW (ref 98–111)
Creatinine, Ser: 1.42 mg/dL — ABNORMAL HIGH (ref 0.44–1.00)
GFR, Estimated: 38 mL/min — ABNORMAL LOW (ref >60)
Glucose, Bld: 122 mg/dL — ABNORMAL HIGH (ref 70–99)
Potassium: 3.7 mmol/L (ref 3.5–5.1)
Sodium: 137 mmol/L (ref 135–145)
Total Bilirubin: 0.7 mg/dL (ref 0.0–1.2)
Total Protein: 6.3 g/dL — ABNORMAL LOW (ref 6.5–8.1)

## 2023-09-29 LAB — RAPID URINE DRUG SCREEN, HOSP PERFORMED
Amphetamines: NOT DETECTED
Barbiturates: NOT DETECTED
Benzodiazepines: NOT DETECTED
Cocaine: NOT DETECTED
Opiates: NOT DETECTED
Tetrahydrocannabinol: NOT DETECTED

## 2023-09-29 LAB — GLUCOSE, CAPILLARY
Glucose-Capillary: 105 mg/dL — ABNORMAL HIGH (ref 70–99)
Glucose-Capillary: 124 mg/dL — ABNORMAL HIGH (ref 70–99)
Glucose-Capillary: 124 mg/dL — ABNORMAL HIGH (ref 70–99)
Glucose-Capillary: 95 mg/dL (ref 70–99)
Glucose-Capillary: 93 mg/dL (ref 70–99)
Glucose-Capillary: 118 mg/dL — ABNORMAL HIGH (ref 70–99)

## 2023-09-29 LAB — C-REACTIVE PROTEIN: CRP: 1.4 mg/dL — ABNORMAL HIGH (ref <1.0)

## 2023-09-29 LAB — SODIUM, URINE, RANDOM: Sodium, Ur: <30 mmol/L

## 2023-09-29 LAB — CK: Total CK: 38 U/L (ref 38–234)

## 2023-09-29 LAB — MAGNESIUM: Magnesium: 2.1 mg/dL (ref 1.7–2.4)

## 2023-09-29 LAB — VITAMIN B12: Vitamin B-12: 342 pg/mL (ref 180–914)

## 2023-09-29 LAB — TYPE AND SCREEN
ABO/RH(D): A POS
Antibody Screen: NEGATIVE

## 2023-09-29 LAB — HEMOGLOBIN A1C
Hgb A1c MFr Bld: 6.2 % — ABNORMAL HIGH (ref 4.8–5.6)
Mean Plasma Glucose: 131.24 mg/dL

## 2023-09-29 LAB — CREATININE, URINE, RANDOM: Creatinine, Urine: 47 mg/dL

## 2023-09-29 LAB — TSH: TSH: <0.1 u[IU]/mL — ABNORMAL LOW (ref 0.350–4.500)

## 2023-09-29 LAB — SEDIMENTATION RATE: Sed Rate: 28 mm/h — ABNORMAL HIGH (ref 0–22)

## 2023-09-29 SURGERY — INCISION AND DRAINAGE, ABSCESS
Anesthesia: General | Site: Middle Finger | Laterality: Right

## 2023-09-29 MED ORDER — BUPIVACAINE HCL (PF) 0.25 % IJ SOLN
INTRAMUSCULAR | Status: DC | PRN
  Administered 2023-09-29: 10 mL

## 2023-09-29 MED ORDER — 0.9 % SODIUM CHLORIDE (POUR BTL) OPTIME
TOPICAL | Status: DC | PRN
  Administered 2023-09-29: 1000 mL

## 2023-09-29 MED ORDER — LACTATED RINGERS IV SOLN: INTRAVENOUS | Status: DC

## 2023-09-29 MED ORDER — PROPOFOL 10 MG/ML IV BOLUS
INTRAVENOUS | Status: DC | PRN
  Administered 2023-09-29: 150 mg via INTRAVENOUS
  Administered 2023-09-29: 50 mg via INTRAVENOUS

## 2023-09-29 MED ORDER — LIDOCAINE 2% (20 MG/ML) 5 ML SYRINGE
INTRAMUSCULAR | Status: DC | PRN
  Administered 2023-09-29: 100 mg via INTRAVENOUS

## 2023-09-29 MED ORDER — AMISULPRIDE (ANTIEMETIC) 5 MG/2ML IV SOLN: 10.0000 mg | Freq: Once | INTRAVENOUS | Status: DC | PRN

## 2023-09-29 MED ORDER — PHENYLEPHRINE HCL-NACL 20-0.9 MG/250ML-% IV SOLN
INTRAVENOUS | Status: DC | PRN
  Administered 2023-09-29: 25 ug/min via INTRAVENOUS

## 2023-09-29 MED ORDER — INSULIN ASPART 100 UNIT/ML IJ SOLN: 0.0000 [IU] | Freq: Four times a day (QID) | INTRAMUSCULAR | Status: DC

## 2023-09-29 MED ORDER — OXYCODONE HCL 5 MG/5ML PO SOLN: 5.0000 mg | Freq: Once | ORAL | Status: DC | PRN

## 2023-09-29 MED ORDER — CHLORHEXIDINE GLUCONATE 0.12 % MT SOLN
15.0000 mL | Freq: Once | OROMUCOSAL | Status: AC
  Administered 2023-09-29: 15 mL via OROMUCOSAL

## 2023-09-29 MED ORDER — HYDROCODONE-ACETAMINOPHEN 5-325 MG PO TABS
1.0000 | ORAL_TABLET | ORAL | Status: DC | PRN
  Administered 2023-09-30: 1 via ORAL
  Filled 2023-09-29: qty 1

## 2023-09-29 MED ORDER — VITAMIN C 500 MG PO TABS
1000.0000 mg | ORAL_TABLET | Freq: Every day | ORAL | Status: DC
  Administered 2023-09-29 – 2023-10-04 (×6): 1000 mg via ORAL
  Filled 2023-09-29 (×7): qty 2

## 2023-09-29 MED ORDER — ORAL CARE MOUTH RINSE: 15.0000 mL | Freq: Once | OROMUCOSAL | Status: AC

## 2023-09-29 MED ORDER — ACETAMINOPHEN 500 MG PO TABS
ORAL_TABLET | ORAL | Status: AC
  Administered 2023-09-29: 1000 mg via ORAL
  Filled 2023-09-29: qty 2

## 2023-09-29 MED ORDER — ACETAMINOPHEN 500 MG PO TABS: 1000.0000 mg | ORAL_TABLET | Freq: Once | ORAL | Status: AC

## 2023-09-29 MED ORDER — ONDANSETRON HCL 4 MG/2ML IJ SOLN
INTRAMUSCULAR | Status: DC | PRN
  Administered 2023-09-29 (×2): 4 mg via INTRAVENOUS

## 2023-09-29 MED ORDER — ACETAMINOPHEN 500 MG PO TABS
500.0000 mg | ORAL_TABLET | Freq: Four times a day (QID) | ORAL | Status: DC
  Administered 2023-09-29 – 2023-09-30 (×3): 500 mg via ORAL
  Filled 2023-09-29 (×3): qty 1

## 2023-09-29 MED ORDER — OXYCODONE HCL 5 MG PO TABS: 5.0000 mg | ORAL_TABLET | Freq: Once | ORAL | Status: DC | PRN

## 2023-09-29 MED ORDER — INSULIN ASPART 100 UNIT/ML IJ SOLN: 0.0000 [IU] | INTRAMUSCULAR | Status: DC | PRN

## 2023-09-29 MED ORDER — FENTANYL CITRATE (PF) 100 MCG/2ML IJ SOLN: 25.0000 ug | INTRAMUSCULAR | Status: DC | PRN

## 2023-09-29 MED ORDER — BUPIVACAINE HCL (PF) 0.5 % IJ SOLN
INTRAMUSCULAR | Status: AC
Start: 2023-09-29 — End: 2023-09-29
  Filled 2023-09-29: qty 30

## 2023-09-29 MED ORDER — ACETAMINOPHEN 325 MG PO TABS: 325.0000 mg | ORAL_TABLET | Freq: Four times a day (QID) | ORAL | Status: DC | PRN

## 2023-09-29 MED ORDER — MORPHINE SULFATE (PF) 2 MG/ML IV SOLN: 0.5000 mg | INTRAVENOUS | Status: DC | PRN

## 2023-09-29 SURGICAL SUPPLY — 48 items
ADAPTER CATH SYR TO TUBING 38M (ADAPTER) ×1 IMPLANT
BAG COUNTER SPONGE SURGICOUNT (BAG) ×1 IMPLANT
BNDG COHESIVE 1X5 TAN STRL LF (GAUZE/BANDAGES/DRESSINGS) IMPLANT
BNDG COMPR ESMARK 4X3 LF (GAUZE/BANDAGES/DRESSINGS) IMPLANT
BNDG ELASTIC 3INX 5YD STR LF (GAUZE/BANDAGES/DRESSINGS) ×1 IMPLANT
BNDG ELASTIC 4X5.8 VLCR STR LF (GAUZE/BANDAGES/DRESSINGS) ×1 IMPLANT
BNDG GAUZE DERMACEA FLUFF 4 (GAUZE/BANDAGES/DRESSINGS) ×1 IMPLANT
CANNULA VESSEL 3MM 2 BLNT TIP (CANNULA) IMPLANT
CORD BIPOLAR FORCEPS 12FT (ELECTRODE) ×1 IMPLANT
COVER SURGICAL LIGHT HANDLE (MISCELLANEOUS) ×1 IMPLANT
CUFF TOURN SGL QUICK 18X4 (TOURNIQUET CUFF) IMPLANT
CUFF TRNQT CYL 24X4X16.5-23 (TOURNIQUET CUFF) IMPLANT
DRAIN PENROSE 12X.25 LTX STRL (MISCELLANEOUS) IMPLANT
GAUZE PACKING IODOFORM 1/2INX (GAUZE/BANDAGES/DRESSINGS) IMPLANT
GAUZE PAD ABD 8X10 STRL (GAUZE/BANDAGES/DRESSINGS) ×2 IMPLANT
GAUZE SPONGE 4X4 12PLY STRL (GAUZE/BANDAGES/DRESSINGS) ×1 IMPLANT
GAUZE XEROFORM 1X8 LF (GAUZE/BANDAGES/DRESSINGS) ×1 IMPLANT
GLOVE BIO SURGEON STRL SZ7.5 (GLOVE) ×1 IMPLANT
GLOVE BIOGEL PI IND STRL 8 (GLOVE) ×1 IMPLANT
GLOVE BIOGEL PI IND STRL 8.5 (GLOVE) IMPLANT
GLOVE PI ORTHO PRO STRL SZ8 (GLOVE) IMPLANT
GLOVE SURG ORTHO 8.0 STRL STRW (GLOVE) IMPLANT
GOWN STRL REUS W/ TWL LRG LVL3 (GOWN DISPOSABLE) ×1 IMPLANT
GOWN STRL REUS W/ TWL XL LVL3 (GOWN DISPOSABLE) ×1 IMPLANT
KIT BASIN OR (CUSTOM PROCEDURE TRAY) ×1 IMPLANT
KIT TURNOVER KIT B (KITS) ×1 IMPLANT
LOOP VASCLR MAXI BLUE 18IN ST (MISCELLANEOUS) IMPLANT
MANIFOLD NEPTUNE II (INSTRUMENTS) IMPLANT
NDL HYPO 25X1 1.5 SAFETY (NEEDLE) IMPLANT
NEEDLE HYPO 25X1 1.5 SAFETY (NEEDLE) IMPLANT
NS IRRIG 1000ML POUR BTL (IV SOLUTION) ×1 IMPLANT
PACK ORTHO EXTREMITY (CUSTOM PROCEDURE TRAY) ×1 IMPLANT
PAD ARMBOARD POSITIONER FOAM (MISCELLANEOUS) ×2 IMPLANT
SET CYSTO W/LG BORE CLAMP LF (SET/KITS/TRAYS/PACK) IMPLANT
SOL PREP POV-IOD 4OZ 10% (MISCELLANEOUS) ×2 IMPLANT
SPIKE FLUID TRANSFER (MISCELLANEOUS) ×1 IMPLANT
SPONGE T-LAP 4X18 ~~LOC~~+RFID (SPONGE) ×1 IMPLANT
SUT ETHILON 4 0 P 3 18 (SUTURE) IMPLANT
SUT ETHILON 4 0 PS 2 18 (SUTURE) IMPLANT
SUT MON AB 5-0 P3 18 (SUTURE) IMPLANT
SWAB COLLECTION DEVICE MRSA (MISCELLANEOUS) IMPLANT
SWAB CULTURE ESWAB REG 1ML (MISCELLANEOUS) IMPLANT
SYR 20ML LL LF (SYRINGE) ×1 IMPLANT
SYR CONTROL 10ML LL (SYRINGE) IMPLANT
TOWEL GREEN STERILE (TOWEL DISPOSABLE) ×1 IMPLANT
TUBE CONNECTING 12X1/4 (SUCTIONS) ×1 IMPLANT
UNDERPAD 30X36 HEAVY ABSORB (UNDERPADS AND DIAPERS) ×1 IMPLANT
YANKAUER SUCT BULB TIP NO VENT (SUCTIONS) ×1 IMPLANT

## 2023-09-29 NOTE — Anesthesia Procedure Notes (Signed)
 Procedure Name: LMA Insertion Date/Time: 09/29/2023 1:13 PM  Performed by: Mollie Olivia SAUNDERS, CRNAPre-anesthesia Checklist: Patient identified, Emergency Drugs available, Suction available and Patient being monitored Patient Re-evaluated:Patient Re-evaluated prior to induction Oxygen Delivery Method: Circle System Utilized Preoxygenation: Pre-oxygenation with 100% oxygen Induction Type: IV induction Ventilation: Mask ventilation without difficulty LMA: LMA inserted LMA Size: 4.0 Number of attempts: 1 Airway Equipment and Method: Bite block Placement Confirmation: positive ETCO2 Tube secured with: Tape Dental Injury: Teeth and Oropharynx as per pre-operative assessment

## 2023-09-29 NOTE — Transfer of Care (Signed)
 Immediate Anesthesia Transfer of Care Note  Patient: Laura [name]  Procedure(s) Performed: INCISION AND DRAINAGE, ABSCESS (Right: Middle Finger)  Patient Location: PACU  Anesthesia Type:General  Level of Consciousness: awake, alert , and oriented  Airway & Oxygen Therapy: Patient Spontanous Breathing  Moves all extremites well, VSS  Post vital signs: Reviewed and stable  Last Vitals:  Vitals Value Taken Time  BP 110/56 09/29/23 13:45  Temp    Pulse 73 09/29/23 13:47  Resp 14 09/29/23 13:47  SpO2 95 % 09/29/23 13:47  Vitals shown include unfiled device data.  Last Pain:  Vitals:   09/29/23 1152  TempSrc:   PainSc: 5       Patients Stated Pain Goal: 0 (09/29/23 0000)  Complications: No notable events documented.

## 2023-09-29 NOTE — Progress Notes (Signed)
 PROGRESS NOTE  Laura [name]    DOB: [birth year], 78 y.o.  FMW:[id number]    Code Status: Full Code   DOA: 09/28/2023   LOS: 1   Brief hospital course  Laura [name] is a 78 y.o. female with a PMH significant for type 2 diabetes mellitus, rheumatoid arthritis on chronic immunosuppressive therapy, who is admitted to [facility] on 09/28/2023 with cellulitis of the right hand.   She has a history of rheumatoid arthritis and is on chronic immunosuppressive therapy with q. weekly methotrexate  in addition to daily hydroxychloroquine .  She is also on chronic prednisone  therapy.   ED Course:  Vital signs in the ED were notable for the following: Temperature max 29.3; rates in the 70s; systolic blood pressures initially in the 90s, subsidy increasing to the 130s following interval IV fluids, as further quantified below; respiratory rate 18-20; O2 saturation 95 to 100% room air.   Labs were notable for the following: CMP notable for the following: Creatinine 1.59 compared to 1.80 on 06/21/2023, glucose 116, liver enzymes were within normal limits.  Initial lactate 0.8, with repeat value noted to be 1.0.  CBC notable for white blood cell count 9800, hemoglobin 12.  Urinalysis associated with cloudy appearing specimen, greater than 50 red blood cells, moderate leukocyte esterase, many bacteria, no evidence of squamous epithelial cells, negative for protein, 6-10 RBCs and was positive for hyaline cast.  Urine culture collected.   Plain film of the right 3rd finger showed soft tissue swelling without evidence of subcutaneous gas, no evidence of warm body, and no evidence of acute osteomyelitis or acute fracture.  Noncontrast CT head showed no evidence of acute intracranial process.  09/29/23 -Hand-surgeon has evaluated today and brought to OR for I&D.   Assessment & Plan  Principal Problem:   Cellulitis of right upper extremity Active Problems:   Depression   Acquired hypothyroidism   HLD  (hyperlipidemia)   Rheumatoid arthritis (HCC)   DM2 (diabetes mellitus, type 2) (HCC)   Acute cystitis   Acute metabolic encephalopathy   AKI (acute kidney injury) (HCC)   R 3rd digit cellulitis.  - continue IV Abx - hand surgery following. I&D 9/19  #) Acute cystitis: on antibiotics. Follow culture   #) Acute metabolic encephalopathy: improved. Secondary to infection(s)    #) Acute Kidney Injury:  improving. Cr 1.59>1.42 s/p IVF - BMP am    #) Type 2 Diabetes Mellitus: repeat hgb A1c is 6.2. diet controlled.  Stop insulin  sliding scale.    #) acquired hypothyroidism:  - continue Synthroid  as outpatient.     #) Hyperlipidemia:  - continue home statin.    # ) Rheumatoid arthritis:- increases infection risk - continue hydroxychloroquine  as well as prednisone .    #) Depression:  - continue home doxepin  and Cymbalta  for now, as above.  Body mass index is 39.22 kg/m.  VTE ppx: SCDs Start: 09/28/23 2339  Diet:     Diet   Diet NPO time specified   Consultants: Hand-surgery   Subjective 09/29/23    Pt reports feeling well. Finger pain and redness have improved   Objective  Blood pressure 105/76, pulse 79, temperature 98.6 F (37 C), temperature source Oral, resp. rate (!) 21, height 5' 5 (1.651 m), weight 106.9 kg, SpO2 92%.  Intake/Output Summary (Last 24 hours) at 09/29/2023 0740 Last data filed at 09/28/2023 2145 Gross per 24 hour  Intake 500 ml  Output --  Net 500 ml   [employer]  09/28/23 1642 09/29/23 0416  Weight: 108.9 kg 106.9 kg    Physical Exam:  General: awake, alert, NAD Respiratory: normal respiratory effort. Cardiovascular: quick capillary refill Nervous: A&O x3. no gross focal neurologic deficits, normal speech Extremities: R third finger erythematous and edema present. Please see clinical image for further detail Skin: dry, intact, normal temperature, normal color. No rashes, lesions or ulcers on exposed skin Psychiatry: normal mood,  congruent affect  Labs   I have personally reviewed the following labs and imaging studies CBC    Component Value Date/Time   WBC 10.4 09/29/2023 0250   RBC 3.85 (L) 09/29/2023 0250   HGB 11.2 (L) 09/29/2023 0250   HGB 13.6 06/12/2013 1508   HCT 34.7 (L) 09/29/2023 0250   HCT 41.8 06/12/2013 1508   PLT 317 09/29/2023 0250   PLT 251 06/12/2013 1508   MCV 90.1 09/29/2023 0250   MCV 85 06/12/2013 1508   MCH 29.1 09/29/2023 0250   MCHC 32.3 09/29/2023 0250   RDW 14.5 09/29/2023 0250   RDW 16.3 (H) 06/12/2013 1508   LYMPHSABS 2.4 09/29/2023 0250   MONOABS 0.8 09/29/2023 0250   EOSABS 0.4 09/29/2023 0250   BASOSABS 0.1 09/29/2023 0250      Latest Ref Rng & Units 09/29/2023    2:50 AM 09/28/2023    5:17 PM 10/29/2022    5:48 AM  BMP  Glucose 70 - 99 mg/dL 877  883  869   BUN 8 - 23 mg/dL 30  30  18    Creatinine 0.44 - 1.00 mg/dL 8.57  8.40  9.11   Sodium 135 - 145 mmol/L 137  133  137   Potassium 3.5 - 5.1 mmol/L 3.7  4.2  4.1   Chloride 98 - 111 mmol/L 97  93  100   CO2 22 - 32 mmol/L 21  27  25    Calcium  8.9 - 10.3 mg/dL 9.0  9.2  9.1     CT Head Wo Contrast Result Date: 09/28/2023 CLINICAL DATA:  Altered level of consciousness EXAM: CT HEAD WITHOUT CONTRAST TECHNIQUE: Contiguous axial images were obtained from the base of the skull through the vertex without intravenous contrast. RADIATION DOSE REDUCTION: This exam was performed according to the departmental dose-optimization program which includes automated exposure control, adjustment of the mA and/or kV according to patient size and/or use of iterative reconstruction technique. COMPARISON:  10/28/2022 FINDINGS: Brain: Confluent hypodensities throughout the periventricular white matter most consistent with chronic small vessel ischemic changes. No evidence of acute infarct or hemorrhage. Lateral ventricles and midline structures appear unremarkable. No acute extra-axial fluid collections. No mass effect. Vascular: No hyperdense  vessel or unexpected calcification. Skull: Normal. Negative for fracture or focal lesion. Sinuses/Orbits: No acute finding. Other: None. IMPRESSION: 1. No acute intracranial process. Chronic small vessel ischemic changes as above. Electronically Signed   By: Ozell Daring M.D.   On: 09/28/2023 22:46   DG Finger Middle Right Result Date: 09/28/2023 CLINICAL DATA:  Swelling and erythema EXAM: RIGHT MIDDLE FINGER 2+V COMPARISON:  None Available. FINDINGS: Frontal, oblique, and lateral views of the right third digit are obtained. There are no acute or destructive bony abnormalities. Severe joint space narrowing and osteophyte formation of the distal interphalangeal joint compatible with osteoarthritis. There is diffuse soft tissue swelling, with no subcutaneous gas or radiopaque foreign body identified. IMPRESSION: 1. Soft tissue swelling, with no evidence of radiopaque foreign body or soft tissue gas. 2. No radiographic evidence of osteomyelitis. 3. Severe osteoarthritis. Electronically Signed  By: Ozell Daring M.D.   On: 09/28/2023 18:46   Disposition Plan & Communication  Patient status: Inpatient  Admitted From: Home Planned disposition location: Home Anticipated discharge date: 9/20 pending surgical culture follow up  Family Communication: none at bedside    Author: Marien LITTIE Piety, DO [facility] 09/29/2023, 7:40 AM   Available by Epic secure chat 7AM-7PM. If 7PM-7AM, please contact night-coverage.  [facility] contact information found on [email or web address].

## 2023-09-29 NOTE — Progress Notes (Signed)
Cbg 105 

## 2023-09-29 NOTE — Consult Note (Signed)
 Laura [name] is an 78 y.o. female.   Chief Complaint: finger infection HPI: 78 yo female with right long finger swelling pain erythema.  Has been worsening over past week.  Does not remember any injury.    Allergies:  Allergies  Allergen Reactions   Bupropion Other (See Comments)    Siezures   Jardiance [Empagliflozin] Nausea And Vomiting and Other (See Comments)    Severe Projectile vomiting   Penicillins Nausea And Vomiting and Rash   Statins Diarrhea and Other (See Comments)    Intolerance at higher doses and frequency.     Past Medical History:  Diagnosis Date   Allergy    Anxiety    Aortic atherosclerosis (HCC)    Arthritis, degenerative 10/08/2013   Asthma    Cardiomegaly    Chronic kidney disease, stage 3 (HCC)    Chronic, continuous use of opioids    a.) followed by pain management; b.) has naloxone  Rx available   Coronary artery disease involving native coronary artery of native heart without angina pectoris    a.) MV 11/08/2022: mild ischemia in apical septal wall (TID 0.94); b.) LHC 11/24/2022: minor luminal irregs LM/LAD/LCx, 50% pRCA, 25% mRCA - med mgmt   DDD (degenerative disc disease), lumbar    Depression    Diet-controlled type 2 diabetes mellitus (HCC)    Essential hypertension    GERD (gastroesophageal reflux disease)    Heberden's nodes    HFrEF (heart failure with reduced ejection fraction) (HCC)    a.) TTE 10/09/2018: EF 55-60%, no RWMAS, mild RVE, mild BAE, triv MR/TR, RVSP 48.7; b.) TTE 03/18/2021: EF >55%, no RWMAs, G!DD, triv AR/TR/PR, mild MR   History of bilateral cataract extraction    Hypothyroidism    Insomnia    Long term methotrexate  user    Long-term use of aspirin  therapy    Long-term use of hydroxychloroquine     Long-term use of infliximab    Lumbar spinal stenosis with neurogenic claudication 11/07/2014   Memory loss    Memory loss, short term 03/19/2014   Mixed hyperlipidemia    MRSA infection    upper lip   OSA on CPAP     Osteoarthritis    Paroxysmal atrial fibrillation (HCC)    a.) occurred in setting of PNA and hypokalemia; no recurrence   Pneumonia    Pulmonary hypertension (HCC)    a.) TTE 10/09/2018: RVSP 48.7 mmHg   Renal cyst, left    Rheumatoid arthritis (HCC)    a.) on hydryoxychloroqine + MTX + infliximab   Sciatica of left side 12/31/2019   Seizure (HCC) 10/07/2014   one time episode from wellbutrin   Stool incontinence 10/2022   Umbilical hernia    Urinary incontinence    Vitamin D  deficiency     Past Surgical History:  Procedure Laterality Date   ABDOMINAL HYSTERECTOMY     CATARACT EXTRACTION W/ INTRAOCULAR LENS  IMPLANT, BILATERAL     CESAREAN SECTION     x2   DIAGNOSTIC LAPAROSCOPY     HIP FRACTURE SURGERY Right    LEFT HEART CATH AND CORONARY ANGIOGRAPHY Left 11/24/2022   Procedure: LEFT HEART CATH AND CORONARY ANGIOGRAPHY;  Surgeon: Florencio Cara BIRCH, MD;  Location: [facility]IVE [facility];  Service: Cardiovascular;  Laterality: Left;   ORIF PELVIC FRACTURE WITH PERCUTANEOUS SCREWS Bilateral 02/02/2022   Procedure: ORIF PELVIC FRACTURE WITH PERCUTANEOUS SCREWS;  Surgeon: Kendal Franky SQUIBB, MD;  Location: [facility] OR;  Service: Orthopedics;  Laterality: Bilateral;   TOTAL KNEE  ARTHROPLASTY Left     Family History: Family History  Problem Relation Age of Onset   Hypertension Mother    Stroke Mother    Heart attack Father    Alcohol abuse Father    Depression Father    Post-traumatic stress disorder Father    Rheum arthritis Sister    Hypertension Sister    Depression Sister    Breast cancer Neg Hx     Social History:   reports that she has never smoked. She has never used smokeless tobacco. She reports that she does not drink alcohol and does not use drugs.  Medications: Medications Prior to Admission  Medication Sig Dispense Refill   acetaminophen  (TYLENOL ) 500 MG tablet Take 500-1,000 mg by mouth every 6 (six) hours as needed for moderate pain (pain score 4-6).     aspirin   EC 81 MG tablet Take 81 mg by mouth daily. Swallow whole.     doxepin  (SINEQUAN ) 50 MG capsule TAKE 1 CAPSULE (50 MG TOTAL) BY MOUTH AT BEDTIME. STOP 25 MG - DOSE INCREASE 90 capsule 0   DULoxetine  (CYMBALTA ) 60 MG capsule Take 1 capsule (60 mg total) by mouth daily. Stop 20 mg , dose change 90 capsule 3   famotidine  (PEPCID ) 20 MG tablet Take 20 mg by mouth daily.     folic acid  (FOLVITE ) 1 MG tablet Take 1 mg by mouth daily.     furosemide  (LASIX ) 80 MG tablet Take 80 mg by mouth every Monday, Wednesday, and Friday.     hydroxychloroquine  (PLAQUENIL ) 200 MG tablet Take 200 mg by mouth daily.     inFLIXimab (REMICADE) 100 MG injection Inject 100 mg into the vein every 8 (eight) weeks.     levothyroxine  (SYNTHROID ) 125 MCG tablet Take 250 mcg by mouth daily before breakfast.     loratadine  (CLARITIN ) 10 MG tablet Take 10 mg by mouth daily.      methotrexate  (RHEUMATREX) 2.5 MG tablet Take 15 mg by mouth every Sunday.  5   metoprolol  succinate (TOPROL -XL) 100 MG 24 hr tablet Take 1 tablet (100 mg total) by mouth daily. Take with or immediately following a meal. 30 tablet 0   naloxone  (NARCAN ) nasal spray 4 mg/0.1 mL Place 1 spray into the nose as needed for up to 365 doses (for opioid-induced respiratory depresssion). In case of emergency (overdose), spray once into each nostril. If no response within 3 minutes, repeat application and call 911. 1 each 0   oxyCODONE  (OXY IR/ROXICODONE ) 5 MG immediate release tablet Take 1 tablet (5 mg total) by mouth every 6 (six) hours as needed for severe pain (pain score 7-10). Must last 30 days 120 tablet 0   potassium chloride  (KLOR-CON ) 10 MEQ tablet Take 10 mEq by mouth every Monday, Wednesday, and Friday.     rosuvastatin  (CRESTOR ) 5 MG tablet Take 5 mg by mouth 2 (two) times a week. (Sunday and Friday)      Results for orders placed or performed during the hospital encounter of 09/28/23 (from the past 48 hours)  I-Stat Lactic Acid, ED     Status: None    Collection Time: 09/28/23  5:11 PM  Result Value Ref Range   Lactic Acid, Venous 0.8 0.5 - 1.9 mmol/L  Comprehensive metabolic panel     Status: Abnormal   Collection Time: 09/28/23  5:17 PM  Result Value Ref Range   Sodium 133 (L) 135 - 145 mmol/L   Potassium 4.2 3.5 - 5.1 mmol/L   Chloride  93 (L) 98 - 111 mmol/L   CO2 27 22 - 32 mmol/L   Glucose, Bld 116 (H) 70 - 99 mg/dL    Comment: Glucose reference range applies only to samples taken after fasting for at least 8 hours.   BUN 30 (H) 8 - 23 mg/dL   Creatinine, Ser 8.40 (H) 0.44 - 1.00 mg/dL   Calcium  9.2 8.9 - 10.3 mg/dL   Total Protein 6.9 6.5 - 8.1 g/dL   Albumin 3.8 3.5 - 5.0 g/dL   AST 14 (L) 15 - 41 U/L   ALT 8 0 - 44 U/L   Alkaline Phosphatase 59 38 - 126 U/L   Total Bilirubin 0.6 0.0 - 1.2 mg/dL   GFR, Estimated 33 (L) >60 mL/min    Comment: (NOTE) Calculated using the CKD-EPI Creatinine Equation (2021)    Anion gap 13 5 - 15    Comment: Performed at [facility], [address] [place], [place] [address]  CBC with Differential     Status: None   Collection Time: 09/28/23  5:17 PM  Result Value Ref Range   WBC 9.8 4.0 - 10.5 K/uL   RBC 4.19 3.87 - 5.11 MIL/uL   Hemoglobin 12.0 12.0 - 15.0 g/dL   HCT 61.4 63.9 - 53.9 %   MCV 91.9 80.0 - 100.0 fL   MCH 28.6 26.0 - 34.0 pg   MCHC 31.2 30.0 - 36.0 g/dL   RDW 85.3 88.4 - 84.4 %   Platelets 341 150 - 400 K/uL   nRBC 0.0 0.0 - 0.2 %   Neutrophils Relative % 58 %   Neutro Abs 5.7 1.7 - 7.7 K/uL   Lymphocytes Relative 31 %   Lymphs Abs 3.0 0.7 - 4.0 K/uL   Monocytes Relative 6 %   Monocytes Absolute 0.6 0.1 - 1.0 K/uL   Eosinophils Relative 4 %   Eosinophils Absolute 0.4 0.0 - 0.5 K/uL   Basophils Relative 1 %   Basophils Absolute 0.1 0.0 - 0.1 K/uL   Immature Granulocytes 0 %   Abs Immature Granulocytes 0.03 0.00 - 0.07 K/uL    Comment: Performed at [facility], [address] [place], [place] [address]  Magnesium     Status: None   Collection  Time: 09/28/23  5:17 PM  Result Value Ref Range   Magnesium 2.2 1.7 - 2.4 mg/dL    Comment: Performed at [facility], [address] [place], [place] [address]  Urinalysis, w/ Reflex to Culture (Infection Suspected) -Urine, Clean Catch     Status: Abnormal   Collection Time: 09/28/23  8:55 PM  Result Value Ref Range   Specimen Source URINE, CLEAN CATCH    Color, Urine YELLOW YELLOW   APPearance CLOUDY (A) CLEAR   Specific Gravity, Urine 1.016 1.005 - 1.030   pH 5.0 5.0 - 8.0   Glucose, UA NEGATIVE NEGATIVE mg/dL   Hgb urine dipstick NEGATIVE NEGATIVE   Bilirubin Urine NEGATIVE NEGATIVE   Ketones, ur NEGATIVE NEGATIVE mg/dL   Protein, ur NEGATIVE NEGATIVE mg/dL   Nitrite NEGATIVE NEGATIVE   Leukocytes,Ua MODERATE (A) NEGATIVE   RBC / HPF 6-10 0 - 5 RBC/hpf   WBC, UA >50 0 - 5 WBC/hpf    Comment:        Reflex urine culture not performed if WBC <=10, OR if Squamous epithelial cells >5. If Squamous epithelial cells >5 suggest recollection.    Bacteria, UA MANY (A) NONE SEEN  Squamous Epithelial / HPF 0-5 0 - 5 /HPF   WBC Clumps PRESENT    Mucus PRESENT    Hyaline Casts, UA PRESENT    Non Squamous Epithelial 0-5 (A) NONE SEEN    Comment: Performed at [facility], [address] [place], [place] [address]  I-Stat Lactic Acid, ED     Status: None   Collection Time: 09/28/23  9:15 PM  Result Value Ref Range   Lactic Acid, Venous 1.0 0.5 - 1.9 mmol/L  Rapid urine drug screen (hospital performed)     Status: None   Collection Time: 09/29/23  2:00 AM  Result Value Ref Range   Opiates NONE DETECTED NONE DETECTED   Cocaine NONE DETECTED NONE DETECTED   Benzodiazepines NONE DETECTED NONE DETECTED   Amphetamines NONE DETECTED NONE DETECTED   Tetrahydrocannabinol NONE DETECTED NONE DETECTED   Barbiturates NONE DETECTED NONE DETECTED    Comment: (NOTE) DRUG SCREEN FOR MEDICAL PURPOSES ONLY.  IF CONFIRMATION IS NEEDED FOR ANY PURPOSE, NOTIFY LAB WITHIN 5  DAYS.  LOWEST DETECTABLE LIMITS FOR URINE DRUG SCREEN Drug Class                     Cutoff (ng/mL) Amphetamine and metabolites    1000 Barbiturate and metabolites    200 Benzodiazepine                 200 Opiates and metabolites        300 Cocaine and metabolites        300 THC                            50 Performed at [facility], [address], [place], [place] [address]   Sodium, urine, random     Status: None   Collection Time: 09/29/23  2:00 AM  Result Value Ref Range   Sodium, Ur <30 mmol/L    Comment: Performed at [facility], [address], [place], [place] [address]  Creatinine, urine, random     Status: None   Collection Time: 09/29/23  2:00 AM  Result Value Ref Range   Creatinine, Urine 47 mg/dL    Comment: Performed at [facility], [address], [place], [place] [address]  Type and screen [facility]     Status: None   Collection Time: 09/29/23  2:49 AM  Result Value Ref Range   ABO/RH(D) A POS    Antibody Screen NEG    Sample Expiration      10/02/2023,2359 Performed at [facility], [address] [place], [place] [address]   CBC with Differential/Platelet     Status: Abnormal   Collection Time: 09/29/23  2:50 AM  Result Value Ref Range   WBC 10.4 4.0 - 10.5 K/uL   RBC 3.85 (L) 3.87 - 5.11 MIL/uL   Hemoglobin 11.2 (L) 12.0 - 15.0 g/dL   HCT 65.2 (L) 63.9 - 53.9 %   MCV 90.1 80.0 - 100.0 fL   MCH 29.1 26.0 - 34.0 pg   MCHC 32.3 30.0 - 36.0 g/dL   RDW 85.4 88.4 - 84.4 %   Platelets 317 150 - 400 K/uL   nRBC 0.0 0.0 - 0.2 %   Neutrophils Relative % 65 %   Neutro Abs 6.7 1.7 - 7.7 K/uL   Lymphocytes Relative 23 %   Lymphs Abs 2.4 0.7 - 4.0  K/uL   Monocytes Relative 7 %   Monocytes Absolute 0.8 0.1 - 1.0 K/uL   Eosinophils Relative 4 %   Eosinophils Absolute 0.4 0.0 - 0.5 K/uL   Basophils Relative 1 %   Basophils Absolute 0.1 0.0 - 0.1 K/uL   Immature Granulocytes 0 %   Abs Immature Granulocytes 0.04  0.00 - 0.07 K/uL    Comment: Performed at [facility], [address] [place], [place] [address]  Comprehensive metabolic panel with GFR     Status: Abnormal   Collection Time: 09/29/23  2:50 AM  Result Value Ref Range   Sodium 137 135 - 145 mmol/L   Potassium 3.7 3.5 - 5.1 mmol/L   Chloride 97 (L) 98 - 111 mmol/L   CO2 21 (L) 22 - 32 mmol/L   Glucose, Bld 122 (H) 70 - 99 mg/dL    Comment: Glucose reference range applies only to samples taken after fasting for at least 8 hours.   BUN 30 (H) 8 - 23 mg/dL   Creatinine, Ser 8.57 (H) 0.44 - 1.00 mg/dL   Calcium  9.0 8.9 - 10.3 mg/dL   Total Protein 6.3 (L) 6.5 - 8.1 g/dL   Albumin 3.5 3.5 - 5.0 g/dL   AST 12 (L) 15 - 41 U/L   ALT 8 0 - 44 U/L   Alkaline Phosphatase 53 38 - 126 U/L   Total Bilirubin 0.7 0.0 - 1.2 mg/dL   GFR, Estimated 38 (L) >60 mL/min    Comment: (NOTE) Calculated using the CKD-EPI Creatinine Equation (2021)    Anion gap 19 (H) 5 - 15    Comment: Performed at [facility], [address], [place], [place] [address]  Magnesium     Status: None   Collection Time: 09/29/23  2:50 AM  Result Value Ref Range   Magnesium 2.1 1.7 - 2.4 mg/dL    Comment: Performed at [facility], [address] [place], [place] [address]  C-reactive protein     Status: Abnormal   Collection Time: 09/29/23  2:50 AM  Result Value Ref Range   CRP 1.4 (H) <1.0 mg/dL    Comment: Performed at [facility], [address], [place], [place] [address]  Sedimentation rate     Status: Abnormal   Collection Time: 09/29/23  2:50 AM  Result Value Ref Range   Sed Rate 28 (H) 0 - 22 mm/hr    Comment: Performed at [facility], [address], [place], [place] [address]  Protime-INR     Status: None   Collection Time: 09/29/23  2:50 AM  Result Value Ref Range   Prothrombin Time 13.7 11.4 - 15.2 seconds   INR 1.0 0.8 - 1.2    Comment: (NOTE) INR goal varies based on device and disease states. Performed at [facility], [address], [place], [place] [address]   Vitamin B12     Status: None   Collection Time: 09/29/23  2:50 AM  Result Value Ref Range   Vitamin B-12 342 180 - 914 pg/mL    Comment: (NOTE) This assay is not validated for testing neonatal or myeloproliferative syndrome specimens for Vitamin B12 levels. Performed at [facility], [address] [place], [place] [address]   TSH     Status: Abnormal   Collection Time: 09/29/23  2:50 AM  Result Value Ref Range   TSH <0.100 (L) 0.350 - 4.500 uIU/mL  Comment: Performed by a 3rd Generation assay with a functional sensitivity of <=0.01 uIU/mL. Performed at [facility], [address], [place], [place] [address]   CK     Status: None   Collection Time: 09/29/23  2:50 AM  Result Value Ref Range   Total CK 38 38 - 234 U/L    Comment: Performed at [facility], [address], [place], [place] [address]  Hemoglobin A1c     Status: Abnormal   Collection Time: 09/29/23  2:50 AM  Result Value Ref Range   Hgb A1c MFr Bld 6.2 (H) 4.8 - 5.6 %    Comment: (NOTE) Diagnosis of Diabetes The following HbA1c ranges recommended by the American Diabetes Association (ADA) may be used as an aid in the diagnosis of diabetes mellitus.  Hemoglobin             Suggested A1C NGSP%              Diagnosis  <5.7                   Non Diabetic  5.7-6.4                Pre-Diabetic  >6.4                   Diabetic  <7.0                   Glycemic control for                       adults with diabetes.     Mean Plasma Glucose 131.24 mg/dL    Comment: Performed at [facility], [address], [place], [place] [address]  Glucose, capillary     Status: Abnormal   Collection Time: 09/29/23  5:26 AM  Result Value Ref Range   Glucose-Capillary 105 (H) 70 - 99 mg/dL    Comment: Glucose reference range applies only to samples taken after fasting for at least 8 hours.  Glucose, capillary     Status: Abnormal   Collection  Time: 09/29/23 11:12 AM  Result Value Ref Range   Glucose-Capillary 124 (H) 70 - 99 mg/dL    Comment: Glucose reference range applies only to samples taken after fasting for at least 8 hours.  Glucose, capillary     Status: Abnormal   Collection Time: 09/29/23 11:51 AM  Result Value Ref Range   Glucose-Capillary 124 (H) 70 - 99 mg/dL    Comment: Glucose reference range applies only to samples taken after fasting for at least 8 hours.    CT Head Wo Contrast Result Date: 09/28/2023 CLINICAL DATA:  Altered level of consciousness EXAM: CT HEAD WITHOUT CONTRAST TECHNIQUE: Contiguous axial images were obtained from the base of the skull through the vertex without intravenous contrast. RADIATION DOSE REDUCTION: This exam was performed according to the departmental dose-optimization program which includes automated exposure control, adjustment of the mA and/or kV according to patient size and/or use of iterative reconstruction technique. COMPARISON:  10/28/2022 FINDINGS: Brain: Confluent hypodensities throughout the periventricular white matter most consistent with chronic small vessel ischemic changes. No evidence of acute infarct or hemorrhage. Lateral ventricles and midline structures appear unremarkable. No acute extra-axial fluid collections. No mass effect. Vascular: No hyperdense vessel or unexpected calcification. Skull: Normal. Negative for fracture or focal lesion. Sinuses/Orbits: No acute finding. Other: None. IMPRESSION: 1. No acute intracranial process. Chronic small  vessel ischemic changes as above. Electronically Signed   By: Ozell Daring M.D.   On: 09/28/2023 22:46   DG Finger Middle Right Result Date: 09/28/2023 CLINICAL DATA:  Swelling and erythema EXAM: RIGHT MIDDLE FINGER 2+V COMPARISON:  None Available. FINDINGS: Frontal, oblique, and lateral views of the right third digit are obtained. There are no acute or destructive bony abnormalities. Severe joint space narrowing and osteophyte  formation of the distal interphalangeal joint compatible with osteoarthritis. There is diffuse soft tissue swelling, with no subcutaneous gas or radiopaque foreign body identified. IMPRESSION: 1. Soft tissue swelling, with no evidence of radiopaque foreign body or soft tissue gas. 2. No radiographic evidence of osteomyelitis. 3. Severe osteoarthritis. Electronically Signed   By: Ozell Daring M.D.   On: 09/28/2023 18:46      Blood pressure (!) 154/74, pulse 75, temperature 98.2 F (36.8 C), temperature source Oral, resp. rate 17, height 5' 5 (1.651 m), weight 106.9 kg, SpO2 96%.  General appearance: alert, cooperative, and appears stated age Head: Normocephalic, without obvious abnormality, atraumatic Neck: supple, symmetrical, trachea midline Extremities: Intact sensation and capillary refill all digits.  +epl/fpl/io.  Small scabbed wound on ulnar side of right long finger.  Erythema and swelling of digit.  Tender dorsally.  No volar tenderness.  Able to move digit. Skin: Skin color, texture, turgor normal. No rashes or lesions Neurologic: Grossly normal Incision/Wound: as above  Assessment/Plan Right long finger infection.  Recommend incision and drainage in OR possibly including dip joint.  Risks, benefits and alternatives of surgery were discussed including risks of blood loss, infection, damage to nerves/vessels/tendons/ligament/bone, failure of surgery, need for additional surgery, complication with wound healing, stiffness, need for repeat irrigation and debridement.  She voiced understanding of these risks and elected to proceed.    Ahliya Glatt 09/29/2023, 12:27 PM

## 2023-09-29 NOTE — Discharge Instructions (Signed)

## 2023-09-29 NOTE — Anesthesia Preprocedure Evaluation (Addendum)
 Anesthesia Evaluation  Patient identified by MRN, date of birth, ID band Patient awake    Reviewed: Allergy & Precautions, NPO status , Patient's Chart, lab work & pertinent test results  History of Anesthesia Complications Negative for: history of anesthetic complications  Airway Mallampati: III  TM Distance: >3 FB Neck ROM: Full   Comment: Previous grade I view with MAC 3, easy mask Dental  (+) Edentulous Upper 3 teeth on the bottom:   Pulmonary neg shortness of breath, asthma (last inhaler use last year) , sleep apnea and Continuous Positive Airway Pressure Ventilation , neg COPD, neg recent URI   Pulmonary exam normal breath sounds clear to auscultation       Cardiovascular hypertension, pulmonary hypertension(-) angina + CAD and +CHF  (-) Past MI, (-) Cardiac Stents and (-) CABG + dysrhythmias (1st degree AV block, AF was in setting of pneumonia and hypokalemia with no recurrence) Atrial Fibrillation  Rhythm:Regular Rate:Normal  HLD  LHC 11/24/2022:   Prox RCA lesion is 50% stenosed.   Mid RCA lesion is 25% stenosed.   The left ventricular systolic function is normal.   LV end diastolic pressure is normal.   The left ventricular ejection fraction is 55-65% by visual estimate.   There is no mitral valve regurgitation.   In the absence of any other complications or medical issues, we expect the patient to be ready for discharge from a cath perspective on 11/24/2022.   No indication for antiplatelet therapy at this time .  TTE 10/09/2018: IMPRESSIONS    1. Left ventricular ejection fraction, by visual estimation, is 55 to  60%. The left ventricle has normal function. There is no left ventricular  hypertrophy.   2. Global right ventricle has normal systolic function.The right  ventricular size is mildly enlarged. No increase in right ventricular wall  thickness.   3. Left atrial size was mildly dilated.   4. Right  atrial size was mildly dilated.   5. The mitral valve was not well visualized. Trace mitral valve  regurgitation.   6. The tricuspid valve is not well visualized. Tricuspid valve  regurgitation is trivial.   7. The aortic valve was not well visualized Aortic valve regurgitation  was not visualized by color flow Doppler.   8. The pulmonic valve was not well visualized. Pulmonic valve  regurgitation is not visualized by color flow Doppler.   9. The aortic root was not well visualized.  10. Moderately elevated pulmonary artery systolic pressure.  11. The interatrial septum was not assessed.      Neuro/Psych Seizures - (x1 due to Wellbutrin), Well Controlled,  PSYCHIATRIC DISORDERS Anxiety Depression     Neuromuscular disease (left-sided sciatica, lubmar spinal stenosis) CVA, No Residual Symptoms    GI/Hepatic Neg liver ROS,GERD  Medicated,,  Endo/Other  diabetes (Hgb A1c 6.2), Well Controlled, Type 2, Insulin  DependentHypothyroidism    Renal/GU CRFRenal disease   Neurogenic bladder    Musculoskeletal  (+) Arthritis  (on hydroxychloroquine , methotrexate , infliximab), Osteoarthritis and Rheumatoid disorders,    Abdominal  (+) + obese  Peds  Hematology  (+) Blood dyscrasia, anemia Lab Results      Component                Value               Date                      WBC  10.4                09/29/2023                HGB                      11.2 (L)            09/29/2023                HCT                      34.7 (L)            09/29/2023                MCV                      90.1                09/29/2023                PLT                      317                 09/29/2023              Anesthesia Other Findings   Reproductive/Obstetrics                              Anesthesia Physical Anesthesia Plan  ASA: 3  Anesthesia Plan: General   Post-op Pain Management: Tylenol  PO (pre-op)*   Induction: Intravenous  PONV  Risk Score and Plan: 3 and Dexamethasone , Ondansetron  and Treatment may vary due to age or medical condition  Airway Management Planned: LMA  Additional Equipment:   Intra-op Plan:   Post-operative Plan: Extubation in OR  Informed Consent: I have reviewed the patients History and Physical, chart, labs and discussed the procedure including the risks, benefits and alternatives for the proposed anesthesia with the patient or authorized representative who has indicated his/her understanding and acceptance.     Dental advisory given  Plan Discussed with: CRNA and Anesthesiologist  Anesthesia Plan Comments: (Risks of general anesthesia discussed including, but not limited to, sore throat, hoarse voice, chipped/damaged teeth, injury to vocal cords, nausea and vomiting, allergic reactions, lung infection, heart attack, stroke, and death. All questions answered. )         Anesthesia Quick Evaluation

## 2023-09-29 NOTE — Progress Notes (Signed)
 LVM for Dr. Murrell to please contact patient's daughter, Vertell (contact information in chart) regarding pt's surgery and after-care plans, as well as other questions Vertell has.

## 2023-09-29 NOTE — Op Note (Signed)
 NAME: Laura [name] MEDICAL RECORD NO: [id number] DATE OF BIRTH: 04-27-1945 FACILITY: [facility] LOCATION: [facility] OR PHYSICIAN: [name], MD   OPERATIVE REPORT   DATE OF PROCEDURE: 09/29/23    PREOPERATIVE DIAGNOSIS: Right long finger infection   POSTOPERATIVE DIAGNOSIS: Right long finger infection   PROCEDURE: Incision and drainage right long finger including DIP joint   SURGEON:  Franky Curia, M.D.   ASSISTANT: none   ANESTHESIA:  General   INTRAVENOUS FLUIDS:  Per anesthesia flow sheet.   ESTIMATED BLOOD LOSS:  Minimal.   COMPLICATIONS:  None.   SPECIMENS: Cultures to micro   TOURNIQUET TIME:    Total Tourniquet Time Documented: Forearm (Right) - 16 minutes Total: Forearm (Right) - 16 minutes    DISPOSITION:  Stable to PACU.   INDICATIONS: 78 year old female with right long finger swelling pain and erythema.  This been worsening over the past week.  She was seen in the emergency department last night and admitted by the hospitalist.  She wishes to proceed with incision and drainage of the right long finger.  Risks, benefits and alternatives of surgery were discussed including the risks of blood loss, infection, damage to nerves, vessels, tendons, ligaments, bone for surgery, need for additional surgery, complications with wound healing, continued pain, stiffness, , need for repeat irrigation and debridement.  She voiced understanding of these risks and elected to proceed.  OPERATIVE COURSE:  After being identified preoperatively by myself,  the patient and I agreed on the procedure and site of the procedure.  The surgical site was marked.  Surgical consent had been signed.  She is on scheduled antibiotics.  She was transferred to the operating room and placed on the operating table in supine position with the Right upper extremity on an arm board.  General anesthesia was induced by the anesthesiologist.  Right upper extremity was prepped and draped in normal sterile  orthopedic fashion.  A surgical pause was performed between the surgeons, anesthesia, and operating room staff and all were in agreement as to the patient, procedure, and site of procedure.  Tourniquet at the proximal aspect of the forearm was inflated to 250 mmHg after exsanguination of the arm with an Esmarch bandage.  Incision was made at the radial side of the long finger over the DIP joint.  This is carried in subcutaneous tissues by spreading technique.  The subcutaneous tissues were indurated and thick.  The DIP joint was entered.  There was fluid within the joint but not gross purulence.  The dorsal soft tissues were opened.  There was a small wound at the ulnar side of the finger.  An incision was made including this wound.  There was small amount of gross purulence.  Cultures were taken for aerobes and anaerobes.  Wound was debrided to remove devitalized skin using the scissors.  This wound was opened and communicated with the dorsal wound.  The wound and joint were copiously irrigated with sterile saline by cystoscopy tubing.  Half-inch iodoform gauze was packed into the wound including a wick in the DIP joint.  Digital block was performed with quarter percent plain Marcaine  to aid in postoperative analgesia.  The wounds were dressed with sterile 4 x 4 and wrapped with a Coban dressing lightly.  AlumaFoam splint was placed and wrapped lightly with Coban dressing.  The tourniquet was deflated at 16 minutes.  Fingertips were pink with brisk capillary refill after deflation of tourniquet.  The operative  drapes were broken down.  The patient  was awoken from anesthesia safely.  She was transferred back to the stretcher and taken to PACU in stable condition.     Monice Lundy, MD Electronically signed, 09/29/23

## 2023-09-29 NOTE — Progress Notes (Signed)
 [id number]: Received call from and spoke with pt's daughter Vertell (contact information in chart) while at patient's bedside. Plan of care discussed.  Vertell would like for surgeon to please contact her to discuss plan before procedure occurs.

## 2023-09-29 NOTE — Anesthesia Postprocedure Evaluation (Signed)
 Anesthesia Post Note  Patient: Laura [name]  Procedure(s) Performed: INCISION AND DRAINAGE, ABSCESS (Right: Middle Finger)     Patient location during evaluation: PACU Anesthesia Type: General Level of consciousness: awake Pain management: pain level controlled Vital Signs Assessment: post-procedure vital signs reviewed and stable Respiratory status: spontaneous breathing, nonlabored ventilation and respiratory function stable Cardiovascular status: blood pressure returned to baseline and stable Postop Assessment: no apparent nausea or vomiting Anesthetic complications: no   No notable events documented.  Last Vitals:  Vitals:   09/29/23 1415 09/29/23 1423  BP: (!) 91/42 132/60  Pulse: 76   Resp: 14 14  Temp: 36.9 C   SpO2: 94%     Last Pain:  Vitals:   09/29/23 1415  TempSrc:   PainSc: 0-No pain                 Delon Aisha Arch

## 2023-09-30 ENCOUNTER — Inpatient Hospital Stay (HOSPITAL_COMMUNITY)

## 2023-09-30 ENCOUNTER — Encounter (HOSPITAL_COMMUNITY): Payer: Self-pay | Admitting: Orthopedic Surgery

## 2023-09-30 LAB — CBC
HCT: 33.5 % — ABNORMAL LOW (ref 36.0–46.0)
Hemoglobin: 10.6 g/dL — ABNORMAL LOW (ref 12.0–15.0)
MCH: 29 pg (ref 26.0–34.0)
MCHC: 31.6 g/dL (ref 30.0–36.0)
MCV: 91.5 fL (ref 80.0–100.0)
Platelets: 257 K/uL (ref 150–400)
RBC: 3.66 MIL/uL — ABNORMAL LOW (ref 3.87–5.11)
RDW: 14.6 % (ref 11.5–15.5)
WBC: 7.8 K/uL (ref 4.0–10.5)
nRBC: 0 % (ref 0.0–0.2)

## 2023-09-30 LAB — BASIC METABOLIC PANEL WITH GFR
Anion gap: 8 (ref 5–15)
BUN: 19 mg/dL (ref 8–23)
CO2: 26 mmol/L (ref 22–32)
Calcium: 8.8 mg/dL — ABNORMAL LOW (ref 8.9–10.3)
Chloride: 104 mmol/L (ref 98–111)
Creatinine, Ser: 1.11 mg/dL — ABNORMAL HIGH (ref 0.44–1.00)
GFR, Estimated: 51 mL/min — ABNORMAL LOW (ref >60)
Glucose, Bld: 128 mg/dL — ABNORMAL HIGH (ref 70–99)
Potassium: 3.7 mmol/L (ref 3.5–5.1)
Sodium: 138 mmol/L (ref 135–145)

## 2023-09-30 LAB — URINE CULTURE: Culture: >=100000 — AB

## 2023-09-30 LAB — IRON AND TIBC
Iron: 26 ug/dL — ABNORMAL LOW (ref 28–170)
Saturation Ratios: 8 % — ABNORMAL LOW (ref 10.4–31.8)
TIBC: 308 ug/dL (ref 250–450)
UIBC: 282 ug/dL

## 2023-09-30 LAB — GLUCOSE, CAPILLARY: Glucose-Capillary: 103 mg/dL — ABNORMAL HIGH (ref 70–99)

## 2023-09-30 LAB — MAGNESIUM: Magnesium: 2 mg/dL (ref 1.7–2.4)

## 2023-09-30 LAB — PHOSPHORUS: Phosphorus: 3.2 mg/dL (ref 2.5–4.6)

## 2023-09-30 MED ORDER — DULOXETINE HCL 60 MG PO CPEP
60.0000 mg | ORAL_CAPSULE | Freq: Every day | ORAL | Status: DC
Start: 2023-09-30 — End: 2023-10-04
  Administered 2023-09-30 – 2023-10-04 (×5): 60 mg via ORAL
  Filled 2023-09-30 (×6): qty 1

## 2023-09-30 MED ORDER — VITAMIN B-12 1000 MCG PO TABS
1000.0000 ug | ORAL_TABLET | Freq: Every day | ORAL | Status: DC
  Administered 2023-09-30 – 2023-10-04 (×5): 1000 ug via ORAL
  Filled 2023-09-30 (×6): qty 1

## 2023-09-30 MED ORDER — OXYCODONE HCL 5 MG PO TABS
5.0000 mg | ORAL_TABLET | Freq: Four times a day (QID) | ORAL | Status: DC | PRN
  Administered 2023-09-30 – 2023-10-03 (×7): 5 mg via ORAL
  Filled 2023-09-30 (×15): qty 1

## 2023-09-30 MED ORDER — DOXEPIN HCL 25 MG PO CAPS
50.0000 mg | ORAL_CAPSULE | Freq: Every day | ORAL | Status: DC
Start: 2023-09-30 — End: 2023-10-04
  Administered 2023-09-30 – 2023-10-03 (×4): 50 mg via ORAL
  Filled 2023-09-30 (×4): qty 2
  Filled 2023-09-30: qty 1
  Filled 2023-09-30 (×2): qty 2

## 2023-09-30 MED ORDER — ENOXAPARIN SODIUM 40 MG/0.4ML IJ SOSY
40.0000 mg | PREFILLED_SYRINGE | INTRAMUSCULAR | Status: DC
  Filled 2023-09-30: qty 0.4

## 2023-09-30 MED ORDER — OXYCODONE HCL 5 MG PO TABS: 5.0000 mg | ORAL_TABLET | Freq: Four times a day (QID) | ORAL | 0 refills | Status: DC | PRN

## 2023-09-30 MED ORDER — INFLUENZA VAC SPLIT HIGH-DOSE 0.5 ML IM SUSY: 0.5000 mL | PREFILLED_SYRINGE | INTRAMUSCULAR | Status: DC | PRN

## 2023-09-30 MED ORDER — FAMOTIDINE 20 MG PO TABS
20.0000 mg | ORAL_TABLET | Freq: Every day | ORAL | Status: DC
Start: 2023-09-30 — End: 2023-10-04
  Administered 2023-09-30 – 2023-10-04 (×5): 20 mg via ORAL
  Filled 2023-09-30 (×6): qty 1

## 2023-09-30 MED ORDER — ENOXAPARIN SODIUM 40 MG/0.4ML IJ SOSY
40.0000 mg | PREFILLED_SYRINGE | INTRAMUSCULAR | Status: AC
  Administered 2023-10-01 – 2023-10-02 (×2): 40 mg via SUBCUTANEOUS
  Filled 2023-09-30 (×2): qty 0.4

## 2023-09-30 MED ORDER — LEVOTHYROXINE SODIUM 125 MCG PO TABS
250.0000 ug | ORAL_TABLET | Freq: Every day | ORAL | Status: DC
  Administered 2023-09-30 – 2023-10-04 (×4): 250 ug via ORAL
  Filled 2023-09-30 (×2): qty 1
  Filled 2023-09-30 (×4): qty 2

## 2023-09-30 MED ORDER — LORATADINE 10 MG PO TABS
10.0000 mg | ORAL_TABLET | Freq: Every day | ORAL | Status: DC
  Administered 2023-09-30 – 2023-10-04 (×5): 10 mg via ORAL
  Filled 2023-09-30 (×6): qty 1

## 2023-09-30 MED ORDER — METOPROLOL SUCCINATE ER 100 MG PO TB24
100.0000 mg | ORAL_TABLET | Freq: Every day | ORAL | Status: DC
Start: 2023-09-30 — End: 2023-10-04
  Administered 2023-09-30 – 2023-10-04 (×5): 100 mg via ORAL
  Filled 2023-09-30 (×6): qty 1

## 2023-09-30 NOTE — Progress Notes (Signed)
 Video call completed with patient and family. Medics and EMT were present in the home. Patient found in bed and appears in no acute distress. Introduced myself as the Charity fundraiser for the night and informed patient that I would be available until 7am. Patient's daughter also made aware of the different methods to contact the RN if needed. Plan made for RN to call later this evening to administer remaining bedtime medications.

## 2023-09-30 NOTE — Progress Notes (Signed)
 Arrived to the PT's house and found her lying in her bed. PT is alert and oriented to her baseline with some memory issues with a reoccurring questions. PT's finger is bandaged up and has not been moved.   Physical exam was performed and documented. Vitals were obtained and documented. Medications were placed in clear containers in the kitchen. Family is wanting to move them so that they are easier accessed to her husband. Safety Ax was performed and no hazards were noted.   Melatonin was removed from the bins as the PT has a reaction of nausea, vomiting, and gagging the following day. The Pt's daughter had requested that we do not give her the medication and remove it from the house so there isn't any confusion.   PT's IV abx was administered without issue. PT's family was explained the Hat[employer] binder and how to use the tablet. PT and family denied having any other questions and were reminded to call the virtual RN if they needed anything else.   Requested the AM visit to be around 1100.

## 2023-09-30 NOTE — Evaluation (Signed)
 Physical Therapy Evaluation Patient Details Name: Laura [name] MRN: [id number] DOB: 1945-10-26 Today's Date: 09/30/2023  History of Present Illness  Pt is 78 year old presented to [facility] on  09/28/23 for cellulitis of rt hand and AMS. Underwent I&D or rt long finger on 09/29/23. PMH - DM, RA, ckd, htn  Clinical Impression  Pt admitted with above diagnosis and presents to PT with functional limitations due to deficits listed below (See PT problem list). Pt needs skilled PT to maximize independence and safety. Pt with supportive family and can participate in hospital at home program. Recommend HHPT after dc from acute hospital.          If plan is discharge home, recommend the following: A little help with walking and/or transfers;A little help with bathing/dressing/bathroom;Assistance with cooking/housework   Can travel by private vehicle        Equipment Recommendations None recommended by PT  Recommendations for Other Services       Functional Status Assessment Patient has had a recent decline in their functional status and demonstrates the ability to make significant improvements in function in a reasonable and predictable amount of time.     Precautions / Restrictions Precautions Precautions: Fall Recall of Precautions/Restrictions: Impaired Restrictions Weight Bearing Restrictions Per Provider Order: No      Mobility  Bed Mobility Overal bed mobility: Needs Assistance Bed Mobility: Supine to Sit     Supine to sit: Supervision, HOB elevated, Used rails     General bed mobility comments: Supervision for safety. Incr time. Uses UE's to assist moving LE's    Transfers Overall transfer level: Needs assistance Equipment used: None Transfers: Sit to/from Stand, Bed to chair/wheelchair/BSC Sit to Stand: Contact guard assist Stand pivot transfers: Contact guard assist   Squat pivot transfers: Contact guard assist     General transfer comment: Assist for safety. Pt  with trunk flexed forward.    Ambulation/Gait               General Gait Details: non ambulatory  Stairs            Wheelchair Mobility     Tilt Bed    Modified Rankin (Stroke Patients Only)       Balance Overall balance assessment: Needs assistance Sitting-balance support: No upper extremity supported, Feet supported Sitting balance-Leahy Scale: Fair     Standing balance support: Bilateral upper extremity supported Standing balance-Leahy Scale: Poor Standing balance comment: UE support and CGA                             Pertinent Vitals/Pain Pain Assessment Pain Assessment: No/denies pain    Home Living Family/patient expects to be discharged to:: Private residence Living Arrangements: Spouse/significant other Available Help at Discharge: Family;Personal care attendant Type of Home: House Home Access: Level entry       Home Layout: One level Home Equipment: Wheelchair - manual;Hospital bed;Grab bars - tub/shower;BSC/3in1      Prior Function Prior Level of Function : Needs assist       Physical Assist : ADLs (physical)     Mobility Comments: Able to transfer to/from w/c modified independent with squat pivot. Propels w/c with upper and lower extremities. ADLs Comments: Sponge bathes. For shower needs assist.     Extremity/Trunk Assessment   Upper Extremity Assessment Upper Extremity Assessment: Generalized weakness    Lower Extremity Assessment Lower Extremity Assessment: RLE deficits/detail;LLE deficits/detail RLE Deficits / Details: chronic weakness <  3/5 LLE Deficits / Details: chronic weakness <3/5       Communication   Communication Communication: No apparent difficulties    Cognition Arousal: Alert Behavior During Therapy: WFL for tasks assessed/performed   PT - Cognitive impairments: Awareness                         Following commands: Impaired Following commands impaired: Follows multi-step  commands with increased time     Cueing Cueing Techniques: Verbal cues, Tactile cues     General Comments      Exercises     Assessment/Plan    PT Assessment Patient needs continued PT services  PT Problem List Decreased strength;Decreased mobility;Decreased balance       PT Treatment Interventions DME instruction;Functional mobility training;Balance training;Patient/family education;Therapeutic activities;Therapeutic exercise;Wheelchair mobility training    PT Goals (Current goals can be found in the Care Plan section)  Acute Rehab PT Goals Patient Stated Goal: go home PT Goal Formulation: With patient/family Time For Goal Achievement: 10/07/23 Potential to Achieve Goals: Good    Frequency Min 2X/week     Co-evaluation               AM-PAC PT 6 Clicks Mobility  Outcome Measure Help needed turning from your back to your side while in a flat bed without using bedrails?: A Little Help needed moving from lying on your back to sitting on the side of a flat bed without using bedrails?: A Little Help needed moving to and from a bed to a chair (including a wheelchair)?: A Little Help needed standing up from a chair using your arms (e.g., wheelchair or bedside chair)?: A Little Help needed to walk in hospital room?: Total Help needed climbing 3-5 steps with a railing? : Total 6 Click Score: 14    End of Session Equipment Utilized During Treatment: Gait belt Activity Tolerance: Patient tolerated treatment well Patient left: in chair Nurse Communication: Mobility status PT Visit Diagnosis: Other abnormalities of gait and mobility (R26.89);Muscle weakness (generalized) (M62.81)    Time: 8958-8892 PT Time Calculation (min) (ACUTE ONLY): 26 min   Charges:   PT Evaluation $PT Eval Moderate Complexity: 1 Mod PT Treatments $Therapeutic Activity: 8-22 mins PT General Charges $$ ACUTE PT VISIT: 1 Visit         [facility] PT Acute Rehabilitation  Services Office [phone]   Rodgers ORN [facility] 09/30/2023, 11:21 AM

## 2023-09-30 NOTE — Plan of Care (Signed)
  Problem: Education: Goal: Knowledge of General Education information will improve Description: Including pain rating scale, medication(s)/side effects and non-pharmacologic comfort measures Outcome: Progressing   Problem: Health Behavior/Discharge Planning: Goal: Ability to manage health-related needs will improve Outcome: Progressing   Problem: Clinical Measurements: Goal: Will remain free from infection Outcome: Progressing   Problem: Nutrition: Goal: Adequate nutrition will be maintained Outcome: Progressing   Problem: Coping: Goal: Level of anxiety will decrease Outcome: Progressing   Problem: Elimination: Goal: Will not experience complications related to bowel motility Outcome: Progressing   Problem: Pain Managment: Goal: General experience of comfort will improve and/or be controlled Outcome: Progressing   Problem: Safety: Goal: Ability to remain free from injury will improve Outcome: Progressing

## 2023-09-30 NOTE — Progress Notes (Signed)
 PT Cancellation Note  Patient Details Name: Laura [name] MRN: [id number] DOB: 1945-04-18   Cancelled Treatment:    Reason Eval/Treat Not Completed: Patient at procedure or test/unavailable. Will try again later.   Rodgers ORN [facility] 09/30/2023, 8:54 AM Rodgers Opal PT Acute [employer] [phone]

## 2023-09-30 NOTE — Plan of Care (Signed)
 Hospital at Home Progress Note     Laura [name]   FMW:[id number]  DOB: 1945/12/02  DOA: 09/28/2023     2 Date of Service: 09/30/2023   Brief Hospital Course:  Laura [name] is a 78 y.o. female with a PMH significant for type 2 diabetes mellitus, rheumatoid arthritis on chronic immunosuppressive therapy, who is admitted to [facility] on 09/28/2023 with cellulitis of the right hand.   She has a history of rheumatoid arthritis and is on chronic immunosuppressive therapy with q. weekly methotrexate  in addition to daily hydroxychloroquine .  She is also on chronic prednisone  therapy.   ED Course:  Vital signs in the ED were notable for the following: Temperature max 29.3; rates in the 70s; systolic blood pressures initially in the 90s, subsidy increasing to the 130s following interval IV fluids, as further quantified below; respiratory rate 18-20; O2 saturation 95 to 100% room air.   Labs were notable for the following: CMP notable for the following: Creatinine 1.59 compared to 1.80 on 06/21/2023, glucose 116, liver enzymes were within normal limits.  Initial lactate 0.8, with repeat value noted to be 1.0.  CBC notable for white blood cell count 9800, hemoglobin 12.  Urinalysis associated with cloudy appearing specimen, greater than 50 red blood cells, moderate leukocyte esterase, many bacteria, no evidence of squamous epithelial cells, negative for protein, 6-10 RBCs and was positive for hyaline cast.  Urine culture collected.   Plain film of the right 3rd finger showed soft tissue swelling without evidence of subcutaneous gas, no evidence of warm body, and no evidence of acute osteomyelitis or acute fracture.  Noncontrast CT head showed no evidence of acute intracranial process.   09/29/23 -Hand-surgeon has evaluated today and brought to OR for I&D.   09/30/23:  S/p I&D. Wound dressing in place. Pending ortho assessment prior to transfer to hospital at home program.   Subjective:   R hand wound clinically improving. No reported fevers or chills. No abdominal pain, nausea or vomiting.  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  She does ambulate with a wheelchair as well as a rolling walker at times.  Daughter is an active physical therapist.  Patient, daughter as well as patient has been would like for patient to return home for continued management of right hand cellulitis that is going to require prolonged IV antibiotics in the setting of chronic immunosuppression. The family is agreeable to the hospital at home program.   Hospital Problems Assessment and Plan: R 3rd digit cellulitis.  - On IV rocephin  and vancomycin  for infections coverage  - hand surgery following. I&D 9/19 -Case discussed w/ Dr. Murrell- will need extended course of IV antibiotics in setting of chronic immunosuppression.  -Pt may be dalvivancin candidate at discharge    #) Acute cystitis:  On IV rocephin   No active complaints of dysuria or abdominal  Basline urinary incontinence as well as chronic immunosuppression is a confounding issue  Urine culture 9/18 with pansensitive e coli    #) Spinal Stenosis  Baseline spinal stenosis in setting of RA (Severe central spinal canal stenosis and severe bilateral lateral recess stenosis at L3-4 with tangling of the nerve roots of the cauda equina and compression of the L4 nerves in the lateral recesses bilaterally on 08/2023 MRI L Spine) severe with elements of functional paraplegia per the daughter  Patient ambulates with a wheelchair and rolling walker per the daughter At functional baseline  at present  #) CAD  Noted prior history of CAD s/p LHC 11/2022 w/ Prox RCA lesion 50% stenosed, Mid RCA lesion 25% stenosed.  No indication for antiplatelet therapy at that time No active chest pain  Monitor  #) Acute metabolic encephalopathy: improved. Secondary to  infection(s)    #) Acute Kidney Injury:   improving. Cr 1.59>1.42 s/p IVF Repeat BMP Pending    #) Type 2 Diabetes Mellitus:  Repeat hgb A1c is 6.2. diet controlled.  Monitor    #) acquired hypothyroidism:  - continue Synthroid  as outpatient.     #) Hyperlipidemia:  - continue home statin.     # ) Rheumatoid arthritis: increases infection risk - Holding plaquenil , methotrexate  in the interim     #) Depression:  - continue home doxepin  and Cymbalta  for now, as above.   Body mass index is 39.22 kg/m.   VTE ppx: SCDs Start: 09/28/23 2339      Objective Vital signs were reviewed and unremarkable. Vitals:   09/30/23 0025 09/30/23 0503 09/30/23 1148 09/30/23 1228  BP: 131/83 (!) 148/82 (!) 171/68 (!) 135/114  Pulse: 76 77 74 70  Temp: 98.5 F (36.9 C) 98.1 F (36.7 C) 98.2 F (36.8 C)   Resp: 20 (!) 22  17  Height:      Weight:  104 kg    SpO2:   96%   TempSrc: Oral Oral Oral   BMI (Calculated):  38.15      Exam Physical Exam Constitutional:      Appearance: She is obese.  HENT:     Head: Normocephalic and atraumatic.     Nose: Nose normal.     Mouth/Throat:     Mouth: Mucous membranes are moist.  Eyes:     Pupils: Pupils are equal, round, and reactive to light.  Cardiovascular:     Rate and Rhythm: Normal rate and regular rhythm.  Pulmonary:     Effort: Pulmonary effort is normal.      Labs / Other Information There are no new results to review at this time. CT Head Wo Contrast CLINICAL DATA:  Altered level of consciousness  EXAM: CT HEAD WITHOUT CONTRAST  TECHNIQUE: Contiguous axial images were obtained from the base of the skull through the vertex without intravenous contrast.  RADIATION DOSE REDUCTION: This exam was performed according to the departmental dose-optimization program which includes automated exposure control, adjustment of the mA and/or kV according to patient size and/or use of iterative reconstruction  technique.  COMPARISON:  10/28/2022  FINDINGS: Brain: Confluent hypodensities throughout the periventricular white matter most consistent with chronic small vessel ischemic changes. No evidence of acute infarct or hemorrhage. Lateral ventricles and midline structures appear unremarkable. No acute extra-axial fluid collections. No mass effect.  Vascular: No hyperdense vessel or unexpected calcification.  Skull: Normal. Negative for fracture or focal lesion.  Sinuses/Orbits: No acute finding.  Other: None.  IMPRESSION: 1. No acute intracranial process. Chronic small vessel ischemic changes as above.  Electronically Signed   By: Ozell Daring M.D.   On: 09/28/2023 22:46 DG Finger Middle Right CLINICAL DATA:  Swelling and erythema  EXAM: RIGHT MIDDLE FINGER 2+V  COMPARISON:  None Available.  FINDINGS: Frontal, oblique, and lateral views of the right third digit are obtained. There are no acute or destructive bony abnormalities. Severe joint space narrowing and osteophyte formation of the distal interphalangeal joint compatible with osteoarthritis. There is diffuse soft tissue swelling, with no subcutaneous gas or radiopaque foreign body identified.  IMPRESSION: 1. Soft tissue swelling, with no evidence of radiopaque foreign body or soft tissue gas. 2. No radiographic evidence of osteomyelitis. 3. Severe osteoarthritis.  Electronically Signed   By: Ozell Daring M.D.   On: 09/28/2023 18:46  Lab Results  Component Value Date   WBC 7.8 09/30/2023   HGB 10.6 (L) 09/30/2023   HCT 33.5 (L) 09/30/2023   MCV 91.5 09/30/2023   PLT 257 09/30/2023   Last metabolic panel Lab Results  Component Value Date   GLUCOSE 128 (H) 09/30/2023   NA 138 09/30/2023   K 3.7 09/30/2023   CL 104 09/30/2023   CO2 26 09/30/2023   BUN 19 09/30/2023   CREATININE 1.11 (H) 09/30/2023   GFRNONAA 51 (L) 09/30/2023   CALCIUM  8.8 (L) 09/30/2023   PHOS 3.2 09/30/2023   PROT 6.3 (L)  09/29/2023   ALBUMIN 3.5 09/29/2023   BILITOT 0.7 09/29/2023   ALKPHOS 53 09/29/2023   AST 12 (L) 09/29/2023   ALT 8 09/29/2023   ANIONGAP 8 09/30/2023     Hospital at Home Admission Criteria Checklist:  Formal consent explained in detail and signed at the bedside: yes Patient meets inpatient admission criteria (see below for further details) yes Is pt Medicare FFS/[employer] Medicare-Medicaid, [employer], [employer] ( required for initial launch with plan to expand)? yes Lives within 25 mil/ 30 min from [facility] within [place](pt may stay with family member during admission who lives within 25 miles or 30 min from [facility] w[facility]in [facility])? yes Hemodynamically stable with relatively low risk of clinical deterioration-not requiring ICU? yes Age >55? yes Does not require frequent touch-points or complex interventions/medications (ie Titrated Infusions (IV insulin , heparin  drips, vasoactive drips, use of infused or injectable controlled substances, patients on insulin )? yes Any Behavioral Health comorbidities likely to increase risk for in-home care (ie Acute delirium or experiencing a marked altered mental status and cause is not a treatable condition in the home)? no Has the patient been on BIPAP during course of ED evaluation or hospitalization? no IF YES, Has the patient been off of BIPAP for >24 hours(If NO-THEN PATIENT DOES MEET INCLUSION CRITERIA)? not applicable On Room Air or Needs oxygen at home (<6L)? is not on home oxygen therapy. Active safety concerns (ie Unable to use bedside commode independently and lacks caregiver support for safety- needs SNF placement, unable to obtain IV access)? yes Has skin check been performed? yes  Has Physical Therapy screened the patient? yes  Common admission diagnoses including: CAP, COPD Exacerbation, Acute on chronic heart failure, Cellulitis, UTI , dehydration, acute resp failure with hypoxia (requiring <5L)   Social Screening:  - Has the  family been directly contacted about Hospital at Home program with consent obtained (if yes- please document who was spoken to with name and phone number)? yes Denies significant ETOH intake? no Does not smoke and understands may not smoke in the presence of oxygen? yes-no smoking  Patient states able to use iPad/phone for communication/has family who is able to use? yes Patient has agreed to be compliant with medication and treatment regimen of the program? yes Any active drug use in patient or primary caregiver including daily dosing of methadone? no Stable home environment ( access to appropriate heating in cold conditions and/or appropriate air conditioning in hot conditions and/or no running water/electricity)? yes  No aggressive pets at home? no Firearm present? no  With ability or willingness to store them unloaded in a locked case for duration of hospitalization? not applicable  Ambulatory? no  wheelchair bound Bed bugs present on home evaluation? no Family support system in place? yes Patient feels safe at home and does not endorse any violence? yes Any actively decompensated behavioral health issues including agitation/aggressive behavior? no  Patient requests food to be provided by hospital home program? yes PT/OT eval completed and not requiring SNF, ALF, inpatient rehab? yes  To be admitted to the Hospital at [facility] program, a patient generally must meet the following: 1. Requirement for Inpatient Level of Care: The patient's condition must necessitate an inpatient level of care. This is typically indicated by one or more of the following, depending on their specific diagnosis:  Persistent tachycardia despite appropriate treatment (e.g., for Heart Failure, UTI). Persistent tachypnea (rapid breathing) or dyspnea (shortness of breath) that hasn't improved sufficiently with observation care (e.g., for Heart Failure, Pneumonia, Viral Illness, COVID). Hypoxemia (low oxygen levels), such as  a new need for oxygen, an increased need from baseline, or specific oxygen saturation levels (e.g., SpO2 <90-94% depending on the condition) that persist despite observation (e.g., for Heart Failure, COPD, Pneumonia, Viral Illness, COVID). Need for Intravenous (IV) hydration due to an inability to maintain oral hydration, which persists despite observation care (e.g., for Cellulitis, UTI, Viral Illness, COVID). Specific to Heart Failure: Persistent pulmonary edema, indicated by a new oxygen need, lack of improvement with IV diuretics, and ongoing tachypnea/dyspnea. Specific to COPD: A decrease in known baseline resting oxygen saturation (SpO2) by 4% or more, or an increase in pre-existing supplemental oxygen requirements, which persists despite observation and requires continued close monitoring. Specific to Pneumonia: A Pneumonia Severity Index (PSI) class IV (moderate risk). Specific to Cellulitis: Failure of outpatient antibiotic therapy (indicated by progression or no improvement after a minimum of 48 hours on an adequate regimen) or a clinical presentation (like acuity or rapidity of progression) that requires the intensity of monitoring found in an inpatient setting. Specific to UTI: Persistence or worsening of clinical findings like fever, pain, or dehydration despite observation care; presence of significant uropathy; suspected infection of an indwelling prosthetic device, stent, implant, or graft; or pregnancy with suspected pyelonephritis.  2. Appropriateness for Hospital at Home Setting: The patient's overall clinical picture, including the severity of their illness, their care needs, and their medical history and comorbidities, must be suitable for management in the Hospital at Home environment. This essentially means that none of the exclusion criteria (listed below) are met.  Unified Exclusion Criteria for Hospital at Home Admission: A patient would not be eligible for Hospital at Home if  any of the following are present: Hemodynamic Instability: Hypotension (low blood pressure) is present. Respiratory Instability or Needs Beyond Program Capability: There is a new need for invasive or noninvasive ventilatory assistance (like BiPAP or a ventilator). Oxygenation is not sufficient, generally indicated if an FiO2 (fraction of inspired oxygen) of 45% (which is about 6 Liters/minute via nasal cannula) or more is required to keep oxygen saturation (SpO2) at 90% or greater. Monitoring or Procedural Needs Beyond Program Capability: There is a need for invasive monitoring, such as a pulmonary artery catheter or an arterial line. There is a need for immediate-response telemetry monitoring (for dangerous arrhythmia detection and subsequent immediate intervention). The required medication regimen is beyond the capabilities of Hospital at Home (e.g., dosing intervals are too frequent for home administration). There is a need for a procedure that cannot be performed by the Hospital at [facility] team (e.g., significant wound debridement or abscess drainage for cellulitis, or percutaneous nephrostomy  for a complicated UTI). Significant Organ Dysfunction or Markers of Severe Illness: Mental status is not at baseline, or there is altered mental status suggestive of inadequate perfusion. Renal (kidney) function is unstable or showing an ongoing decline. There is evidence of inadequate perfusion, such as metabolic acidosis or myocardial ischemia. Uncompensated acidosis is present. Condition-Specific Severity or Complications Making Home Care Unsuitable: For Heart Failure: Known severe cardiac valvular disease (e.g., aortic stenosis, mitral regurgitation); or severe peripheral edema that impairs the ability to urinate or ambulate. For COPD: Known concurrent comorbidity or finding that indicates a higher-risk COPD exacerbation (e.g., pulmonary fibrosis, cavitation, pleural effusion, pneumothorax, rib  fracture). For Pneumonia: Pneumonia Severity Index (PSI) class V (indicating high risk for inpatient mortality); known concurrent comorbidity or finding that indicates higher-risk pneumonia (e.g., pulmonary fibrosis, cavitation, large or loculated pleural effusion); or a concomitant serious infectious process like endocarditis or empyema. For Cellulitis: Orbital, periorbital, or necrotizing infection is suspected; or a concomitant serious infectious process like endocarditis, septic emboli, or septic joint space infection. For UTI: Urinary tract obstruction (e.g., kidney stone, bladder outlet obstruction); or a concomitant serious infectious process like endocarditis or septic emboli. For Viral Illness & COVID-19: A concomitant serious infectious process like endocarditis or empyema.  General Comorbidities or Status:  The patient is significantly immunosuppressed (this applies to Pneumonia, Cellulitis, UTI, Viral Illness, and COVID-19). The patient meets inpatient admission criteria for a second diagnosis, or has care needs beyond the capabilities of Hospital at Home due to an active clinically significant comorbidity. (This is a general exclusion across all listed conditions)  Time spent: >60 minutes  [facility] 09/30/2023, 1:14 PM

## 2023-09-30 NOTE — Progress Notes (Signed)
 Video call completed with patient, her daughter, and her husband. All bedtime medications were administered without issue. RN reinforced to family the different methods that the RN could be reached overnight and they were encouraged to call if any issues arise. Family verbalized understanding.

## 2023-09-30 NOTE — Progress Notes (Signed)
 [facility] Progress Note  Patient: Laura [name]    FMW:[id number]  DOA: 09/28/2023     Date of Service: the patient was seen and examined on 09/30/2023  Chief Complaint  Patient presents with   Finger Injury   Brief hospital course: Laura [name] is a 78 y.o. female with a PMH significant for type 2 diabetes mellitus, rheumatoid arthritis on chronic immunosuppressive therapy, who is admitted to [facility] on 09/28/2023 with cellulitis of the right hand.   She has a history of rheumatoid arthritis and is on chronic immunosuppressive therapy with q. weekly methotrexate  in addition to daily hydroxychloroquine .  She is also on chronic prednisone  therapy.   ED Course:  Vital signs in the ED were notable for the following: Temperature max 29.3; rates in the 70s; systolic blood pressures initially in the 90s, subsidy increasing to the 130s following interval IV fluids, as further quantified below; respiratory rate 18-20; O2 saturation 95 to 100% room air.   Labs were notable for the following: CMP notable for the following: Creatinine 1.59 compared to 1.80 on 06/21/2023, glucose 116, liver enzymes were within normal limits.  Initial lactate 0.8, with repeat value noted to be 1.0.  CBC notable for white blood cell count 9800, hemoglobin 12.  Urinalysis associated with cloudy appearing specimen, greater than 50 red blood cells, moderate leukocyte esterase, many bacteria, no evidence of squamous epithelial cells, negative for protein, 6-10 RBCs and was positive for hyaline cast.  Urine culture collected.   Plain film of the right 3rd finger showed soft tissue swelling without evidence of subcutaneous gas, no evidence of warm body, and no evidence of acute osteomyelitis or acute fracture.  Noncontrast CT head showed no evidence of acute intracranial process.   09/29/23 -Hand-surgeon has evaluated today and brought to OR for I&D.    Assessment and Plan:    # R 3rd digit  cellulitis.  - continue IV Abx vancomycin  and ceftriaxone  - hand surgery consulted, s/p I&D done on 9/19 Follow wound cultures As per hand surgery continue dressing and follow-up as an outpatient.  Cleared for discharge. Patient got excepted at hospital at home program to be followed as an outpatient.  Continue IV antibiotics at home and de-escalate according to cultures Urine culture and wound cultures pending. Wound will be checked at home by RN and hospitalist home remotely.   # Acute cystitis: on antibiotics. Follow culture Follow renal sonogram to rule out obstructive uropathy    # Acute metabolic encephalopathy: improved. Secondary to infection(s)    # AKI: Cr 1.59>1.42>>1.11 s/p IVF, creatinine improved after IV fluid.   # Type 2 Diabetes Mellitus: repeat hgb A1c 6.2. diet controlled.  Stop insulin  sliding scale.    # acquired hypothyroidism: continue Synthroid  as outpatient.     # Hyperlipidemia: continue home statin.     # Rheumatoid arthritis:- continue hydroxychloroquine  and  prednisone  home meds.  Immunocompromised, at risk for infection.   # Depression: continue doxepin  and Cymbalta , home meds   # Obesity class II Body mass index is 38.15 kg/m.  Interventions: Calorie restricted diet and daily exercise advised to lose body weight.  Lifestyle modification discussed.   Diet: Heart healthy/carb modified diet DVT Prophylaxis: Subcutaneous Lovenox    Advance goals of care discussion: Full code  Family Communication: family was present at bedside, at the time of interview.  The pt provided permission to discuss medical plan with the family. Opportunity was given to ask question and all questions were answered satisfactorily.  Disposition:  Pt is from Home, admitted with right third finger infection status post I&D on IV to biotics, patient got excepted by hospital at home program.  Still patient will be discharged from the hospital and hospital at home MD will  follow.  Subjective: No significant events overnight.  Dressing of finger came off, it was redressed by RN before discharge.  Patient was still having pain 6/10, denied any other complaints.  No dysuria.  Denied any abdominal pain, no chest pain or palpitation, no shortness of breath. Patient agreed with the discharge planning and follow-up with hospitalist home.  Patient's daughter also agreed at bedside.   Physical Exam: General: NAD, lying comfortably Appear in no distress, affect appropriate Eyes: PERRLA ENT: Oral Mucosa Clear, moist  Neck: no JVD,  Cardiovascular: S1 and S2 Present, no Murmur,  Respiratory: good respiratory effort, Bilateral Air entry equal and Decreased, no Crackles, no wheezes Abdomen: Bowel Sound present, Soft and no tenderness,  Skin: no rashes Extremities: no Pedal edema, no calf tenderness Right hand third finger dressing CDI.  Mild tenderness. Neurologic: without any new focal findings Gait not checked due to patient safety concerns  Vitals:   09/29/23 1937 09/30/23 0025 09/30/23 0503 09/30/23 1148  BP: 137/62 131/83 (!) 148/82 (!) 171/68  Pulse: 61 76 77 74  Resp: 15 20 (!) 22   Temp: 98.8 F (37.1 C) 98.5 F (36.9 C) 98.1 F (36.7 C) 98.2 F (36.8 C)  TempSrc: Oral Oral Oral Oral  SpO2:    96%  Weight:   104 kg   Height:        Intake/Output Summary (Last 24 hours) at 09/30/2023 1225 Last data filed at 09/30/2023 0513 Gross per 24 hour  Intake 30.33 ml  Output 600 ml  Net -569.67 ml   Filed Weights   09/28/23 1642 09/29/23 0416 09/30/23 0503  Weight: 108.9 kg 106.9 kg 104 kg    Data Reviewed: I have personally reviewed and interpreted daily labs, tele strips, imagings as discussed above. I reviewed all nursing notes, pharmacy notes, vitals, pertinent old records I have discussed plan of care as described above with RN and patient/family.  CBC: Recent Labs  Lab 09/28/23 1717 09/29/23 0250 09/30/23 0302  WBC 9.8 10.4 7.8   NEUTROABS 5.7 6.7  --   HGB 12.0 11.2* 10.6*  HCT 38.5 34.7* 33.5*  MCV 91.9 90.1 91.5  PLT 341 317 257   Basic Metabolic Panel: Recent Labs  Lab 09/28/23 1717 09/29/23 0250  NA 133* 137  K 4.2 3.7  CL 93* 97*  CO2 27 21*  GLUCOSE 116* 122*  BUN 30* 30*  CREATININE 1.59* 1.42*  CALCIUM  9.2 9.0  MG 2.2 2.1    Studies: No results found.  Scheduled Meds:  vitamin C   1,000 mg Oral Daily   vitamin B-12  1,000 mcg Oral Daily   doxepin   50 mg Oral QHS   DULoxetine   60 mg Oral Daily   enoxaparin  (LOVENOX ) injection  40 mg Subcutaneous Q24H   famotidine   20 mg Oral Daily   levothyroxine   250 mcg Oral QAC breakfast   loratadine   10 mg Oral Daily   metoprolol  succinate  100 mg Oral Daily   Continuous Infusions:  cefTRIAXone  (ROCEPHIN )  IV 1 g (09/29/23 2135)   [START ON 10/01/2023] vancomycin      PRN Meds: acetaminophen  **OR** acetaminophen , HYDROcodone -acetaminophen , melatonin, naLOXone  (NARCAN )  injection  Time spent: 55 minutes  Author: ELVAN SOR. MD [facility] 09/30/2023 12:25  PM  To reach On-call, see care teams to locate the attending and reach out to them via [email or web address]. If 7PM-7AM, please contact night-coverage If you still have difficulty reaching the attending provider, please page the [facility] (Director on Call) for [facility] on amion for assistance.

## 2023-09-30 NOTE — Care Management (Addendum)
 Transition of Care [facility]) - Inpatient Brief Assessment   Patient Details  Name: Laura [name] MRN: [id number] Date of Birth: 1945/05/16  [facility]) CM/SW Contact:    Corean JAYSON Canary, RN Phone Number: 09/30/2023, 11:46 AM   Clinical Narrative: Clinical Narrative: Patient with a history of RA,  on remicade  CKD, hypertension. She lives with her daughter and haas a wheelchair and walker.  Is frequently incontinent and uses depends and home purewick. Has increase in weakness, presented with finger infection. This was I&D today in OR. Will be on IV antibiotics. Is being screened for Hospital at home program.  1630 patient may have trouble with post MD appt information on AVS for Wheelchair transportation.  Patient has been recommended for home health from PT, she last had [facility] in 2024.    IP care management will follow for needs, recommendations, and transitions of care.   Transition of Care Asessment: Insurance and Status: Insurance coverage has been reviewed Patient has primary care physician: Yes Home environment has been reviewed: Lives with Daughter Prior level of function:: Indepedent Prior/Current Home Services: No current home services Social Drivers of Health Review: SDOH reviewed no interventions necessary Readmission risk has been reviewed: Yes

## 2023-09-30 NOTE — Progress Notes (Signed)
 Arrived to the PT's bedside, PT is alert and oriented to her baseline. PT's daughter is at the bedside. PT stated that she was feeling good but had some pain. She had just received her pain medication.   PT was explained how the hospital at home visits work and was able to answer all questions. All demographics, pharmacy preference, allergies, PTA meds, and emergency contacts were verified. Per family, PT is pretty much a paraplegic and unable to bare weight on her feet. Daughter stated that she does have a bedside commode and wheelchair at home.   PT and family denied any other questions and were informed we were waiting on orthopedics to ger her for transportation.

## 2023-10-01 DIAGNOSIS — L03011 Cellulitis of right finger: Principal | ICD-10-CM

## 2023-10-01 LAB — CBC
HCT: 35.1 % — ABNORMAL LOW (ref 36.0–46.0)
Hemoglobin: 11 g/dL — ABNORMAL LOW (ref 12.0–15.0)
MCH: 28.6 pg (ref 26.0–34.0)
MCHC: 31.3 g/dL (ref 30.0–36.0)
MCV: 91.4 fL (ref 80.0–100.0)
Platelets: 252 K/uL (ref 150–400)
RBC: 3.84 MIL/uL — ABNORMAL LOW (ref 3.87–5.11)
RDW: 15.1 % (ref 11.5–15.5)
WBC: 8 K/uL (ref 4.0–10.5)
nRBC: 0 % (ref 0.0–0.2)

## 2023-10-01 LAB — BASIC METABOLIC PANEL WITH GFR
Anion gap: 10 (ref 5–15)
BUN: 15 mg/dL (ref 8–23)
CO2: 25 mmol/L (ref 22–32)
Calcium: 8.6 mg/dL — ABNORMAL LOW (ref 8.9–10.3)
Chloride: 105 mmol/L (ref 98–111)
Creatinine, Ser: 0.93 mg/dL (ref 0.44–1.00)
GFR, Estimated: >60 mL/min (ref >60)
Glucose, Bld: 121 mg/dL — ABNORMAL HIGH (ref 70–99)
Potassium: 4.2 mmol/L (ref 3.5–5.1)
Sodium: 140 mmol/L (ref 135–145)

## 2023-10-01 MED ORDER — VANCOMYCIN HCL IN DEXTROSE 1-5 GM/200ML-% IV SOLN
1000.0000 mg | INTRAVENOUS | Status: DC
  Filled 2023-10-01: qty 200

## 2023-10-01 NOTE — Progress Notes (Addendum)
 Phone calls to home 0830, 0845, and 1030 rang but never picked up. Unable to leave message. No answer on [facility] video call at 1040. At 1045, Dtr's cell went straight to voicemail, which is listed as mobile contact as well as emergency contact in Epic. VM left with contact info. Paramedic Sarah notified of this and is enroute. Information has continued to populate on the [facility] dashboard.

## 2023-10-01 NOTE — Progress Notes (Signed)
 Patient 's wearable not showing any data for past hour, gave time to backfill. Pt does not answer video call or the 2 phone numbers provided.pt's husband called back and confirmed wearable in place.

## 2023-10-01 NOTE — Plan of Care (Signed)
  Problem: Coping: Goal: Ability to adjust to condition or change in health will improve Outcome: Progressing   Problem: Education: Goal: Knowledge of General Education information will improve Description: Including pain rating scale, medication(s)/side effects and non-pharmacologic comfort measures Outcome: Progressing   Problem: Health Behavior/Discharge Planning: Goal: Ability to manage health-related needs will improve Outcome: Progressing   Problem: Clinical Measurements: Goal: Ability to maintain clinical measurements within normal limits will improve Outcome: Progressing   Problem: Nutrition: Goal: Adequate nutrition will be maintained Outcome: Progressing   Problem: Coping: Goal: Level of anxiety will decrease Outcome: Progressing   Problem: Pain Managment: Goal: General experience of comfort will improve and/or be controlled Outcome: Progressing   Problem: Safety: Goal: Ability to remain free from injury will improve Outcome: Progressing

## 2023-10-01 NOTE — Progress Notes (Signed)
 Pt seen for routine [facility] evening visit. Pt is sleeping when I walk into her room. Pt states she feels well and was just watching some tv.   Vital signs and assessment obtained as noted.  Medications administered as noted.   Pt has pulled splint and bandage off fingers and does not remember doing so. Site re-wrapped with gauze, coban, splint, and tape. Will ask night medic, Shawn, to bring mitt out later.   IV care completed and curos cap added.   Pt and husband encouraged to call with any problems or questions and Paramedic will be out later with mitt and to administer bedtime medications.

## 2023-10-01 NOTE — Progress Notes (Signed)
 Video chatted with patient while paramedic at the  house. Pt had pulled her dressing and splint off of her finger, but stated she didn't remember doing that. Paramedic replaced dressing. Paramedic to bring out another splint and mitten to cover hand and perhaps patient will leave dressing alone. Pt states she understands but does seem pleasantly confused.

## 2023-10-01 NOTE — Progress Notes (Signed)
 Spoke with patient's husband on the phone. He informed RN that he had readjusted the patient's armband. Current health information starting to populate.

## 2023-10-01 NOTE — Progress Notes (Signed)
 Arrived to find the patient in her bed watching TV with no obvious distress.  Splint on her finger was changed and rewrapped.  IV was assessed and rewrapped.  Bedtime Medications were given with the RN and also Oxycodone  was given due to 6/10 pain in her finger  Mitts were given to the pt and husband. They both was educated on how to put them on and secure them before bed per pt request.  They was advised to call if they needed anything during the night.

## 2023-10-01 NOTE — Progress Notes (Signed)
 Completed virtual visit with patient, friend, Dr. Fausto, and paramedics Lauraine and Steffan. Patient appears calm and comfortable. Pain to known cellulitis site on right finger. Paramedics to give a dose of medication for this. Plan reassessment within 1 hour. New photos uploaded to Epic. Skin assessment completed with Lauraine and Steffan. No orders seen for wound care. Dr Fausto notified and has begun to address this.   Patient's personal cell phone number [phone] received from patient. Called the number from the virtual hub. Restrictions lifted on the phone so that the patient can receive Hospital at the Home phone calls.  Discussed plan of care with patient. Questions answered to patient's satisfaction. Encouraged patient to call with any concerns.

## 2023-10-01 NOTE — Progress Notes (Signed)
 Notified patient and her husband of paramedics arrival time. Plan made to administer medications with RN once paramedic arrives.

## 2023-10-01 NOTE — Progress Notes (Signed)
 Hospital at Home Daily Progress Note   Patient: Laura [name]  MRN: [id number]  DOB: Oct 29, 1945  DOA: 09/28/2023  DOS: the patient was seen and examined on @TODAY @    Patient identified themself as Laura [name]  DOB 1945-06-27  Medic Lauraine Faes present at bedside during encounter and performed the physical exam. Patient was seen today via video chat; my physical location [place], [place]   Brief hospital course:  Dellia Donnelly is a 78 y.o. female with a PMH significant for type 2 diabetes mellitus, rheumatoid arthritis on chronic immunosuppressive therapy, who is admitted to [facility] on 09/28/2023 with cellulitis of the right hand.   She has a history of rheumatoid arthritis and is on chronic immunosuppressive therapy with q. weekly methotrexate  in addition to daily hydroxychloroquine .  She is also on chronic prednisone  therapy.   ED Course:  Vital signs in the ED were notable for the following: Temperature max 29.3; rates in the 70s; systolic blood pressures initially in the 90s, subsidy increasing to the 130s following interval IV fluids, as further quantified below; respiratory rate 18-20; O2 saturation 95 to 100% room air.   Labs were notable for the following: CMP notable for the following: Creatinine 1.59 compared to 1.80 on 06/21/2023, glucose 116, liver enzymes were within normal limits.  Initial lactate 0.8, with repeat value noted to be 1.0.  CBC notable for white blood cell count 9800, hemoglobin 12.  Urinalysis associated with cloudy appearing specimen, greater than 50 red blood cells, moderate leukocyte esterase, many bacteria, no evidence of squamous epithelial cells, negative for protein, 6-10 RBCs and was positive for hyaline cast.  Urine culture collected.   Plain film of the right 3rd finger showed soft tissue swelling without evidence of subcutaneous gas, no evidence of warm body, and no evidence of acute osteomyelitis or acute fracture.  Noncontrast CT  head showed no evidence of acute intracranial process.   09/29/23:  Hand-surgeon has evaluated today and brought to OR for I&D.    09/30/23:  S/p I&D. Wound dressing in place. Transferred to [facility] program.  Continued on empiric IV Vancomycin  and Rocephin  pending cultures.   10/01/23:   Assessment and Plan:  R 3rd digit purulent cellulitis.  S/p I&D on 09/29/23 by Dr. Murrell hand surgeon --Continue IV rocephin  and vancomycin  --Follow cultures --Prior to transfer to [facility], case was discussed w/ Dr. Murrell- recommended extended course of IV antibiotics in setting of chronic immunosuppression.  --Pt may be Dal[place]ancin candidate at discharge - discussing with pharmacy   Acute cystitis: culture grew pan-sensitive E coli --On IV rocephin   No active complaints of dysuria or abdominal  Basline urinary incontinence as well as chronic immunosuppression is a confounding issue    Spinal Stenosis  Ambulatory dysfunction - due to above and likely generalized deconditioning Baseline spinal stenosis in setting of RA (Severe central spinal canal stenosis and severe bilateral lateral recess stenosis at L3-4 with tangling of the nerve roots of the cauda equina and compression of the L4 nerves in the lateral recesses bilaterally on 08/2023 MRI L Spine) severe with elements of functional paraplegia per the daughter  Patient ambulates with a wheelchair and rolling walker per the daughter At functional baseline at present   CAD  Noted prior history of CAD s/p LHC 11/2022 w/ Prox RCA lesion 50% stenosed, Mid RCA lesion 25% stenosed.  No indication for antiplatelet therapy at that time No active chest pain  Monitor   Acute metabolic encephalopathy: improved.  Secondary to infection(s)    Acute Kidney Injury:  resolved with IV fluids improving. Cr 1.59>1.42>1.11>0.93 s/p IVF Monitor BMP    Type 2 Diabetes Mellitus:  Repeat hgb A1c is 6.2. diet controlled.  Monitor    Acquired hypothyroidism:   - continue Synthroid  as outpatient.     Hyperlipidemia:  - continue home statin.     Theumatoid arthritis: increases infection risk - Holding plaquenil , methotrexate  in the interim     Depression:  - continue home doxepin  and Cymbalta  for now, as above.   Obesity Body mass index is 39.22 kg/m. Complicates overall care and prognosis   Patient Active Problem List   Diagnosis Date Noted   Neurogenic bladder 01/09/2023    Priority: High   Neurogenic incontinence 01/09/2023    Priority: High   Generalized osteoarthritis of multiple sites 12/09/2020    Priority: High   Chronic low back pain (1ry area of Pain) (Bilateral) (L>R) w/o sciatica 12/09/2020    Priority: High   Ligamentum flavum hypertrophy (L1-2, L2-3, L3-4) 05/28/2020    Priority: High   Lumbar lateral recess stenosis (Bilateral) (L2-3, L3-4, L4-5) (Severe: L3-4) 05/28/2020    Priority: High   Lumbar Grade 1 Anterolisthesis of L3/L4 and L4/L5 05/28/2020    Priority: High   Intractable back pain 05/28/2020    Priority: High   DDD (degenerative disc disease), lumbosacral 03/10/2020    Priority: High   Abnormal MRI, lumbar spine (04/30/2021) 02/19/2020    Priority: High   Chronic lower extremity pain (2ry area of Pain) (Bilateral) 02/04/2020    Priority: High   Lumbosacral radiculopathy at L5 (Left) 02/04/2020    Priority: High   Chronic anticoagulation (Plaquenil ) 01/20/2020    Priority: High   Chronic arthralgias of knees and hips (Right) 08/18/2016    Priority: High   Osteoarthritis of knee (Right) 01/25/2016    Priority: High   Elevated C-reactive protein (CRP) 07/15/2015    Priority: High   Chronic hip pain (Left) 04/06/2015    Priority: High   History of TKR (total knee replacement) (Left) 02/09/2015    Priority: High   History of femur fracture (Right) 02/09/2015    Priority: High   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) 02/09/2015    Priority: High   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)  02/09/2015    Priority: High   Lumbar foraminal stenosis (Bilateral L3-4 and L5-S1) 02/09/2015    Priority: High   Chronic low back pain (1ry area of Pain) (Bilateral) (L>R) w/ sciatica (Bilateral) 01/22/2015    Priority: High   Chronic pain syndrome 11/25/2014    Priority: High   Lumbar spondylosis 11/07/2014    Priority: High   Lumbar facet syndrome (Bilateral) (L>R) 11/07/2014    Priority: High   Chronic knee pain (Right) 11/05/2014    Priority: High   History of difficult venous access 04/12/2023    Priority: Medium    Pharmacologic therapy 07/11/2018    Priority: Medium    Disorder of skeletal system 07/11/2018    Priority: Medium    Problems influencing health status 07/11/2018    Priority: Medium    Opioid-induced constipation (OIC) 04/21/2016    Priority: Medium    Uncomplicated opioid dependence (HCC) 11/07/2014    Priority: Medium    Opiate use (30 MME/Day) 11/05/2014    Priority: Medium    Long term current use of opiate analgesic 11/05/2014    Priority: Medium    Encounter for therapeutic drug level monitoring 11/05/2014  Priority: Medium    Disturbance of skin sensation 07/15/2015    Priority: Low   Elevated sedimentation rate 07/15/2015    Priority: Low   Stool incontinence 10/28/2022    Priority: 1.   Midline low back pain 02/01/2022    Priority: 1.   Sacral insufficiency fracture 02/01/2022    Priority: 1.   Fracture of transverse process of lumbar vertebra, closed, initial encounter (HCC) 02/01/2022    Priority: 1.   Ambulatory dysfunction 02/01/2022    Priority: 1.   Lumbar spinal stenosis 10/29/2022    Priority: 2.   Chronic back pain 02/01/2022    Priority: 2.   Long term prescription opiate use 11/05/2014    Priority: 2.   Acute cystitis 09/29/2023   Acute metabolic encephalopathy 09/29/2023   AKI (acute kidney injury) (HCC) 09/29/2023   Cellulitis of right upper extremity 09/28/2023   Acute lower UTI 10/29/2022   Rheumatoid arthritis  (HCC) 10/29/2022   DM2 (diabetes mellitus, type 2) (HCC) 10/29/2022   Mild cognitive impairment 06/03/2022   Heberden's nodes 06/03/2022   Displaced fracture of right femoral neck (HCC) 02/02/2022   Fall at home, initial encounter 02/01/2022   Spinal stenosis at L4-L5 level 02/01/2022   Lymphedema 04/26/2021   Chronic use of opiate for therapeutic purpose 04/14/2020   Statin myopathy 03/20/2020   Cerebrovascular accident (CVA) (HCC) 01/02/2020   Sciatica (Left) 12/31/2019   Dropfoot (Left) 11/20/2019   Weakness of foot (Left) 11/20/2019   Weakness of leg (Left) 11/20/2019   Foot drop (Left) 11/20/2019   Chronic hand pain (Left) 10/10/2019   MDD (major depressive disorder), recurrent, in full remission (HCC) 11/22/2018   Paroxysmal atrial fibrillation (HCC) 10/16/2018   Coronary artery disease involving native coronary artery of native heart without angina pectoris 10/15/2018   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   Pneumonia 10/06/2018   Immunosuppression (HCC) 09/18/2018   MDD (major depressive disorder), recurrent episode, mild (HCC) 07/09/2018   Primary insomnia 07/09/2018   Bereavement 07/09/2018   Body mass index (BMI) of 40.0-44.9 in adult (HCC) 10/09/2017   Postmenopausal 04/06/2017   Arthritis 07/11/2016   Dyspnea on exertion 07/11/2016   Kidney disease 07/11/2016   Fibromyalgia syndrome 07/11/2016   Snoring 07/11/2016   Morbid obesity with BMI of 40.0-44.9, adult (HCC) 07/11/2016   Diet-controlled diabetes mellitus (HCC) 10/03/2015   GAD (generalized anxiety disorder) 09/24/2015   Lumbar spinal stenosis (5 mm Severe L3-4; 8 mm L4-5) w/ neurogenic claudication 11/07/2014   Morbid obesity (HCC) 10/27/2014   Extreme obesity (HCC) 10/07/2014   Type 2 diabetes mellitus (HCC) 03/19/2014   Depression 10/14/2013   Essential (primary) hypertension 10/14/2013   Insomnia secondary to chronic pain 07/09/2013   Anxiety 07/09/2013   GERD (gastroesophageal  reflux disease) 01/18/2013   Acquired hypothyroidism 01/18/2013   HLD (hyperlipidemia) 01/18/2013   Seronegative rheumatoid arthritis (HCC) 01/18/2013   CKD (chronic kidney disease) stage 3, GFR 30-59 ml/min (HCC) 01/17/2013   Congestive heart failure with left ventricular systolic dysfunction (HCC) 11/10/2012           Subjective: Pt seen virtually during AM Medic home visit today.  She is in good spirits, talkative. She denies fever or chills.  She denies significant pain in the right finger/hand.     Physical Exam:    09/30/2023    7:34 PM 09/30/2023   12:28 PM 09/30/2023   11:48 AM  Vitals with BMI  Systolic 160 135 828  Diastolic 88 114 68  Pulse  84 70 74     General exam: awake, alert, no acute distress HEENT: moist mucus membranes, hearing grossly normal  Respiratory system: CTAB, on room air, normal respiratory effort. Cardiovascular system: normal S1/S2, RRR, BLE nonpitting edema   Gastrointestinal system: soft, non-tender Central nervous system: A&O no gross focal neurologic deficits, normal speech Psychiatry: normal mood, congruent affect           Data Reviewed:  Notable labs --- Cr noramlized to 0.93, Ca 8.6, glucose 121 (non fasting), Hbg stable 11.0   Family Communication: Husband was present in the home during virtual encounter today  Disposition: Status is: Inpatient Remains inpatient appropriate because: remains of IV antibiotics pending further clinical improvement given underlying chronic immunosuppression and severity of cellulitis.   Planned Discharge Destination: Home with Home Health    Time spent: 45 minutes  Author: Burnard DELENA Cunning, DO [facility]  08/31/2023 7:00 AM  For on call review [email or web address].

## 2023-10-01 NOTE — Progress Notes (Signed)
 Laura [name] seen for routine [facility] morning visit. Laura [name]'s husband answered the door in his wheelchair and showed me where Laura [name] was. Laura [name] found semi-fowler's in hospital bed in bedroom. Laura [name] has a BSC next to her bed as well as a wheelchair. Laura [name] states she never heard the phone ringing because she was sleeping this morning. Laura [name] has a cell phone and her husband was able to find it. We programmed the [facility] hub number into same so that she would be able to receive calls from the virtual RN. Test call completed with Kristin, RN. Laura [name] appears well and states she slept better in her own bed last night. Laura [name] has some memory impairment at baseline and did repeat some questions and statements multiple times.  Vital signs and assessment obtained as noted. Photos of Laura [name]'s surgery sites taken and uploaded to media tab. Same redressed with roller gaze, splint and coban.   Medications administered as noted. IV care performed including new curos cap. Labs drawn.  Laura [name] had virtual visit with RN and Dr. Fausto.   2 person skin assessment completed with Kristin, RN.   Laura [name] has hx of urinary incontinence at baseline. Laura [name] states she normally changes her brief on her own, but also has some memory impairment at baseline. Laura [name] has on 2 incontinence briefs and one was wet. I ripped sides of briefs to help with removal and put new clean briefs on Laura [name]'s legs prior to assisting her to stand. Laura [name] assisted to stand and was unable to bear weight without myself and Steffan, Paramedic, holding her up on both sides as we removed soiled brief and pulled up new clean briefs. Laura [name] has an ace bandage loosely tied around her right leg to help get her leg back up into the bed when she sits back down. Laura [name] assisted back to bed and to semi-fowler's position.   Laura [name] and her husband reminded of using tablet for any medications administered and encouraged to call back with any problems or questions throughout the day.

## 2023-10-01 NOTE — Progress Notes (Signed)
 Video call completed with patient and paramedic. Patient found in bed, sitting up and is very talkative. Bedtime medication and pain medication administered with assist from Remlap, paramedic. Shawn to wrap patient's IV and place mitt over right hand to prevent patient from taking dressing off during the night. RN informed patient to please call if any issues arise overnight.

## 2023-10-01 NOTE — Progress Notes (Signed)
 Pharmacy Antibiotic Note  Laura [name] is a 78 y.o. female admitted on 09/28/2023 with right finger infection s/p I&D.  Pharmacy has been consulted for Vancomycin   dosing.    Plan: With improving SCr, will change Vancomycin  1000 mg IV q24h F/U Staph aureus sensitivities from wound culture  Height: 5' 5 (165.1 cm) Weight:  (unable to obtain due to Pt unable to bear weight and stand on scale without assistance.) IBW/kg (Calculated) : 57  Temp (24hrs), Avg:98 F (36.7 C), Min:97.7 F (36.5 C), Max:98.2 F (36.8 C)  Recent Labs  Lab 09/28/23 1711 09/28/23 1717 09/28/23 2115 09/29/23 0250 09/30/23 0302 09/30/23 1029 10/01/23 1313  WBC  --  9.8  --  10.4 7.8  --  8.0  CREATININE  --  1.59*  --  1.42*  --  1.11* 0.93  LATICACIDVEN 0.8  --  1.0  --   --   --   --     Estimated Creatinine Clearance: 58.7 mL/min (by C-G formula based on SCr of 0.93 mg/dL).    Allergies  Allergen Reactions   Bupropion Other (See Comments)    Siezures   Jardiance [Empagliflozin] Nausea And Vomiting and Other (See Comments)    Severe Projectile vomiting   Penicillins Nausea And Vomiting and Rash   Statins Diarrhea and Other (See Comments)    Intolerance at higher doses and frequency.    Melatonin Other (See Comments)    Gagging, nausea and vomiting the following day     Dail Laura [name] 10/01/2023 11:41 PM

## 2023-10-02 LAB — BASIC METABOLIC PANEL WITH GFR
Anion gap: 11 (ref 5–15)
BUN: 16 mg/dL (ref 8–23)
CO2: 21 mmol/L — ABNORMAL LOW (ref 22–32)
Calcium: 8.3 mg/dL — ABNORMAL LOW (ref 8.9–10.3)
Chloride: 105 mmol/L (ref 98–111)
Creatinine, Ser: 0.88 mg/dL (ref 0.44–1.00)
GFR, Estimated: >60 mL/min (ref >60)
Glucose, Bld: 123 mg/dL — ABNORMAL HIGH (ref 70–99)
Potassium: 5 mmol/L (ref 3.5–5.1)
Sodium: 137 mmol/L (ref 135–145)

## 2023-10-02 MED ORDER — ENOXAPARIN SODIUM 60 MG/0.6ML IJ SOSY
50.0000 mg | PREFILLED_SYRINGE | Freq: Every day | INTRAMUSCULAR | Status: DC
  Administered 2023-10-03 – 2023-10-04 (×2): 50 mg via SUBCUTANEOUS
  Filled 2023-10-02: qty 0.5
  Filled 2023-10-02 (×2): qty 0.6

## 2023-10-02 MED ORDER — HYDRALAZINE HCL 25 MG PO TABS
25.0000 mg | ORAL_TABLET | Freq: Three times a day (TID) | ORAL | Status: DC | PRN
  Filled 2023-10-02 (×3): qty 1

## 2023-10-02 MED ORDER — LINEZOLID 600 MG/300ML IV SOLN
600.0000 mg | Freq: Two times a day (BID) | INTRAVENOUS | Status: DC
  Administered 2023-10-02 – 2023-10-03 (×3): 600 mg via INTRAVENOUS
  Filled 2023-10-02: qty 300
  Filled 2023-10-02: qty 600
  Filled 2023-10-02 (×4): qty 300
  Filled 2023-10-02: qty 600

## 2023-10-02 MED ORDER — ENOXAPARIN SODIUM 100 MG/ML IJ SOSY
50.0000 mg | PREFILLED_SYRINGE | Freq: Every day | INTRAMUSCULAR | Status: DC
Start: 2023-10-03 — End: 2023-10-02
  Filled 2023-10-02: qty 0.5

## 2023-10-02 NOTE — Progress Notes (Signed)
 Assumed care of patient at 1500. Vital sign data on current health not available d/t the wearable device being off the patient. Patient was called and told to put the wearable device  back on. Current health data is currently available.

## 2023-10-02 NOTE — Progress Notes (Signed)
 Arrived to find the PT lying in bed, no obvious distress or trauma noted. PT is alert and oriented to her baseline with memory impairment. PT stated that she slept well, however her bandage had come off in the middle of the night. PT stated that she is some pain, rating it a 6/10 on the pain scale.   Physical exam showed negative DCAPBTLS to the head, neck, or face. PERRLA. Negative JVD or tracheal deviation. Chest expansion is equal with non-labored breathing. ABD is soft, non-tender. LBM 10/01/23. Mild swelling noted to bilateral lower legs. PT stated that she believes it is a cause of her RA flare up. Right middle finger is slightly swollen and red. However  PT denied having any tingling sensation or loss of sensation. Updated picture of the wound was uploaded to media section of chart. Finger was cleaned with saline flush and was re bandaged.   All medications were administered per orders. PT was administered pain medication for her finger. IV labs were drawn. New curos cap was placed on existing IV.   PT denied having any other questions and was reminded that if she needed anything to call the virtual RN.

## 2023-10-02 NOTE — TOC Progression Note (Signed)
 Transition of Care [facility]) - Progression Note    Patient Details  Name: Laura [name] MRN: [id number] Date of Birth: 1945-01-15  Transition of Care [facility] CM/SW Contact  Corean JAYSON Canary, RN Phone Number: 10/02/2023, 11:40 AM  Clinical Narrative:    Spoke on the phone with the patients daughter Laura [name].  She is very agreeable to Home health services and to maximize them. Called Amy from [facility] as she has had them before. Amy accepted for PT OT aide and social work.   Discussed palliative care  for chronic conditions. She stated she has been thinking about this for a while and would like [facility] for Palliative care.  Messaged with Randine at [facility] for referral.  She would like to see if we could set up follow up with hand surgeon in [place] versus [place] and also  questions transportation. Did let her know there are wheelchair vans that will take patients to appts, but it is not always covered by Medicare. Will place this on AVS for them to call.   Touched  base with team regarding suction they do not have portable suction for [employer]. , daughter was already made aware of this.   IP care management will continue to follow for needs, recommendations, and transitions of care                       Expected Discharge Plan and Services                                               Social Drivers of Health (SDOH) Interventions SDOH Screenings   Food Insecurity: No Food Insecurity (09/29/2023)  Housing: Low Risk  (09/29/2023)  Transportation Needs: No Transportation Needs (09/29/2023)  Utilities: Not At Risk (09/29/2023)  Depression (PHQ2-9): Low Risk  (01/09/2023)  Recent Concern: Depression (PHQ2-9) - Medium Risk (01/09/2023)  Financial Resource Strain: Low Risk  (10/19/2022)   Received from [facility]  Physical Activity: Inactive (11/15/2016)  Social Connections: Moderately Isolated (09/29/2023)  Stress: No Stress Concern Present  (11/15/2016)  Tobacco Use: Low Risk  (09/29/2023)    Readmission Risk Interventions     No data to display

## 2023-10-02 NOTE — Plan of Care (Signed)

## 2023-10-02 NOTE — Progress Notes (Signed)
 [facility] Daily Progress Note   Patient: Laura [name]  MRN: [id number]  DOB: August 23, 1945  DOA: 09/28/2023  DOS: the patient was seen and examined on @TODAY @    Patient identified themself as Laura [name]  DOB [birth year]  Medic Richerd Batty present at bedside during encounter and performed the physical exam.  Patient was seen today via video chat; my physical location [place], [place]   Brief hospital course:  Laura [name] is a 78 y.o. female with a PMH significant for type 2 diabetes mellitus, rheumatoid arthritis on chronic immunosuppressive therapy, who is admitted to [facility] on 09/28/2023 with cellulitis of the right hand.   She has a history of rheumatoid arthritis and is on chronic immunosuppressive therapy with q. weekly methotrexate  in addition to daily hydroxychloroquine .  She is also on chronic prednisone  therapy.   ED Course:  Vital signs in the ED were notable for the following: Temperature max 29.3; rates in the 70s; systolic blood pressures initially in the 90s, subsidy increasing to the 130s following interval IV fluids, as further quantified below; respiratory rate 18-20; O2 saturation 95 to 100% room air.   Labs were notable for the following: CMP notable for the following: Creatinine 1.59 compared to 1.80 on 06/21/2023, glucose 116, liver enzymes were within normal limits.  Initial lactate 0.8, with repeat value noted to be 1.0.  CBC notable for white blood cell count 9800, hemoglobin 12.  Urinalysis associated with cloudy appearing specimen, greater than 50 red blood cells, moderate leukocyte esterase, many bacteria, no evidence of squamous epithelial cells, negative for protein, 6-10 RBCs and was positive for hyaline cast.  Urine culture collected.   Plain film of the right 3rd finger showed soft tissue swelling without evidence of subcutaneous gas, no evidence of warm body, and no evidence of acute osteomyelitis or acute fracture.  Noncontrast  CT head showed no evidence of acute intracranial process.   09/29/23:  Hand-surgeon has evaluated today and brought to OR for I&D.    09/30/23:  S/p I&D. Wound dressing in place. Transferred to [facility] program.  Continued on empiric IV Vancomycin  and Rocephin  pending cultures.  9/21: Continued on Vanc/Rocephin   9/22: Transitioned to IV Linezolid  based on abscess cultures.  Stop Rocephin .  Completed adequate UTI treatment.  Pt's finger showing improvement on exam.   Assessment and Plan:  R 3rd digit purulent cellulitis.  S/p I&D on 09/29/23 by Dr. Murrell hand surgeon --Transition empiric IV rocephin  and vancomycin  >> IV Linezolid  --Hand surgeon follow up -- family request in [place], local for convenience --Prior to transfer to [facility], case was discussed w/ Dr. Murrell- recommended extended course of IV antibiotics in setting of chronic immunosuppression.  --Pt may be Dal[place]avancin candidate at discharge - discussing with pharmacy --Will request ID's input tomorrow for antibiotic plan   Acute cystitis: culture grew pan-sensitive E coli --Completed IV rocephin   No active complaints of dysuria or abdominal  Basline urinary incontinence as well as chronic immunosuppression is a confounding issue    Spinal Stenosis  Ambulatory dysfunction - due to above and likely generalized deconditioning Baseline spinal stenosis in setting of RA (Severe central spinal canal stenosis and severe bilateral lateral recess stenosis at L3-4 with tangling of the nerve roots of the cauda equina and compression of the L4 nerves in the lateral recesses bilaterally on 08/2023 MRI L Spine) severe with elements of functional paraplegia per the daughter  Patient ambulates with a wheelchair and rolling walker per the  daughter At functional baseline at present   CAD  Noted prior history of CAD s/p LHC 11/2022 w/ Prox RCA lesion 50% stenosed, Mid RCA lesion 25% stenosed.  No indication for antiplatelet therapy at  that time No active chest pain  Monitor   Acute metabolic encephalopathy: improved. Secondary to infection(s)    Acute Kidney Injury:  resolved with IV fluids improving. Cr 1.59>1.42>1.11>0.93 s/p IVF Monitor BMP    Type 2 Diabetes Mellitus:  Repeat hgb A1c is 6.2. diet controlled.  Monitor    Acquired hypothyroidism:  - continue Synthroid  as outpatient.     Hyperlipidemia:  - continue home statin.     Theumatoid arthritis: increases infection risk - Holding plaquenil , methotrexate  in the interim     Depression:  - continue home doxepin  and Cymbalta  for now, as above.   Obesity Body mass index is 39.22 kg/m. Complicates overall care and prognosis   Patient Active Problem List   Diagnosis Date Noted   Neurogenic bladder 01/09/2023    Priority: High   Neurogenic incontinence 01/09/2023    Priority: High   Generalized osteoarthritis of multiple sites 12/09/2020    Priority: High   Chronic low back pain (1ry area of Pain) (Bilateral) (L>R) w/o sciatica 12/09/2020    Priority: High   Ligamentum flavum hypertrophy (L1-2, L2-3, L3-4) 05/28/2020    Priority: High   Lumbar lateral recess stenosis (Bilateral) (L2-3, L3-4, L4-5) (Severe: L3-4) 05/28/2020    Priority: High   Lumbar Grade 1 Anterolisthesis of L3/L4 and L4/L5 05/28/2020    Priority: High   Intractable back pain 05/28/2020    Priority: High   DDD (degenerative disc disease), lumbosacral 03/10/2020    Priority: High   Abnormal MRI, lumbar spine (04/30/2021) 02/19/2020    Priority: High   Chronic lower extremity pain (2ry area of Pain) (Bilateral) 02/04/2020    Priority: High   Lumbosacral radiculopathy at L5 (Left) 02/04/2020    Priority: High   Chronic anticoagulation (Plaquenil ) 01/20/2020    Priority: High   Chronic arthralgias of knees and hips (Right) 08/18/2016    Priority: High   Osteoarthritis of knee (Right) 01/25/2016    Priority: High   Elevated C-reactive protein (CRP) 07/15/2015     Priority: High   Chronic hip pain (Left) 04/06/2015    Priority: High   History of TKR (total knee replacement) (Left) 02/09/2015    Priority: High   History of femur fracture (Right) 02/09/2015    Priority: High   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) 02/09/2015    Priority: High   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) 02/09/2015    Priority: High   Lumbar foraminal stenosis (Bilateral L3-4 and L5-S1) 02/09/2015    Priority: High   Chronic low back pain (1ry area of Pain) (Bilateral) (L>R) w/ sciatica (Bilateral) 01/22/2015    Priority: High   Chronic pain syndrome 11/25/2014    Priority: High   Lumbar spondylosis 11/07/2014    Priority: High   Lumbar facet syndrome (Bilateral) (L>R) 11/07/2014    Priority: High   Chronic knee pain (Right) 11/05/2014    Priority: High   History of difficult venous access 04/12/2023    Priority: Medium    Pharmacologic therapy 07/11/2018    Priority: Medium    Disorder of skeletal system 07/11/2018    Priority: Medium    Problems influencing health status 07/11/2018    Priority: Medium    Opioid-induced constipation (OIC) 04/21/2016    Priority: Medium    Uncomplicated  opioid dependence (HCC) 11/07/2014    Priority: Medium    Opiate use (30 MME/Day) 11/05/2014    Priority: Medium    Long term current use of opiate analgesic 11/05/2014    Priority: Medium    Encounter for therapeutic drug level monitoring 11/05/2014    Priority: Medium    Disturbance of skin sensation 07/15/2015    Priority: Low   Elevated sedimentation rate 07/15/2015    Priority: Low   Stool incontinence 10/28/2022    Priority: 1.   Midline low back pain 02/01/2022    Priority: 1.   Sacral insufficiency fracture 02/01/2022    Priority: 1.   Fracture of transverse process of lumbar vertebra, closed, initial encounter (HCC) 02/01/2022    Priority: 1.   Ambulatory dysfunction 02/01/2022    Priority: 1.   Lumbar spinal stenosis 10/29/2022    Priority: 2.    Chronic back pain 02/01/2022    Priority: 2.   Long term prescription opiate use 11/05/2014    Priority: 2.   Acute cystitis 09/29/2023   Acute metabolic encephalopathy 09/29/2023   AKI (acute kidney injury) 09/29/2023   Cellulitis of right upper extremity 09/28/2023   Acute lower UTI 10/29/2022   Rheumatoid arthritis (HCC) 10/29/2022   DM2 (diabetes mellitus, type 2) (HCC) 10/29/2022   Mild cognitive impairment 06/03/2022   Heberden's nodes 06/03/2022   Displaced fracture of right femoral neck (HCC) 02/02/2022   Fall at home, initial encounter 02/01/2022   Spinal stenosis at L4-L5 level 02/01/2022   Lymphedema 04/26/2021   Chronic use of opiate for therapeutic purpose 04/14/2020   Statin myopathy 03/20/2020   Cerebrovascular accident (CVA) (HCC) 01/02/2020   Sciatica (Left) 12/31/2019   Dropfoot (Left) 11/20/2019   Weakness of foot (Left) 11/20/2019   Weakness of leg (Left) 11/20/2019   Foot drop (Left) 11/20/2019   Chronic hand pain (Left) 10/10/2019   MDD (major depressive disorder), recurrent, in full remission 11/22/2018   Paroxysmal atrial fibrillation (HCC) 10/16/2018   Coronary artery disease involving native coronary artery of native heart without angina pectoris 10/15/2018   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   Pneumonia 10/06/2018   Immunosuppression 09/18/2018   MDD (major depressive disorder), recurrent episode, mild 07/09/2018   Primary insomnia 07/09/2018   Bereavement 07/09/2018   Body mass index (BMI) of 40.0-44.9 in adult (HCC) 10/09/2017   Postmenopausal 04/06/2017   Arthritis 07/11/2016   Dyspnea on exertion 07/11/2016   Kidney disease 07/11/2016   Fibromyalgia syndrome 07/11/2016   Snoring 07/11/2016   Morbid obesity with BMI of 40.0-44.9, adult (HCC) 07/11/2016   Diet-controlled diabetes mellitus (HCC) 10/03/2015   GAD (generalized anxiety disorder) 09/24/2015   Lumbar spinal stenosis (5 mm Severe L3-4; 8 mm L4-5) w/  neurogenic claudication 11/07/2014   Morbid obesity (HCC) 10/27/2014   Extreme obesity (HCC) 10/07/2014   Type 2 diabetes mellitus (HCC) 03/19/2014   Depression 10/14/2013   Essential (primary) hypertension 10/14/2013   Insomnia secondary to chronic pain 07/09/2013   Anxiety 07/09/2013   GERD (gastroesophageal reflux disease) 01/18/2013   Acquired hypothyroidism 01/18/2013   HLD (hyperlipidemia) 01/18/2013   Seronegative rheumatoid arthritis (HCC) 01/18/2013   CKD (chronic kidney disease) stage 3, GFR 30-59 ml/min (HCC) 01/17/2013   Congestive heart failure with left ventricular systolic dysfunction (HCC) 11/10/2012           Subjective: Pt seen virtually during AM Medic home visit today.  She reports mild pain in the right finger, improved swelling.  No fevers or  chills.  Pt says she feels a lot better today.  Overnight, dressing came off during sleep.   Physical Exam:    10/02/2023   12:30 PM 10/01/2023    4:00 PM 10/01/2023   11:00 AM  Vitals with BMI  Weight -- --   Systolic 172 132 856  Diastolic 79 84 84  Pulse 73 68 80     General exam: awake, alert, no acute distress HEENT: moist mucus membranes, hearing grossly normal  Respiratory system: CTAB, on room air, normal respiratory effort. Cardiovascular system: normal S1/S2, RRR, BLE nonpitting edema   Gastrointestinal system: soft, non-tender Central nervous system: A&O no gross focal neurologic deficits, normal speech Psychiatry: normal mood, congruent affect Extremities - right hand images below show erythema and swelling improving         Data Reviewed:  Notable labs --- Cr noramlized to 0.93, Ca 8.6, glucose 121 (non fasting), Hbg stable 11.0   Family Communication: Husband was present in the home during virtual encounter today  Disposition: Status is: Inpatient Remains inpatient appropriate because: remains of IV antibiotics pending further clinical improvement given underlying chronic  immunosuppression and severity of cellulitis.   Planned Discharge Destination: Home with Home Health    Time spent: 45 minutes  Author: Burnard DELENA Cunning, DO [facility]  08/31/2023 7:00 AM  For on call review [email or web address].

## 2023-10-03 ENCOUNTER — Encounter (HOSPITAL_COMMUNITY): Payer: Self-pay | Admitting: Internal Medicine

## 2023-10-03 DIAGNOSIS — M00041 Staphylococcal arthritis, right hand: Secondary | ICD-10-CM

## 2023-10-03 DIAGNOSIS — L02511 Cutaneous abscess of right hand: Secondary | ICD-10-CM

## 2023-10-03 DIAGNOSIS — Z880 Allergy status to penicillin: Secondary | ICD-10-CM

## 2023-10-03 LAB — CBC
HCT: 35.9 % — ABNORMAL LOW (ref 36.0–46.0)
Hemoglobin: 10.8 g/dL — ABNORMAL LOW (ref 12.0–15.0)
MCH: 28.6 pg (ref 26.0–34.0)
MCHC: 30.1 g/dL (ref 30.0–36.0)
MCV: 95 fL (ref 80.0–100.0)
Platelets: 294 K/uL (ref 150–400)
RBC: 3.78 MIL/uL — ABNORMAL LOW (ref 3.87–5.11)
RDW: 15.2 % (ref 11.5–15.5)
WBC: 8.7 K/uL (ref 4.0–10.5)
nRBC: 0 % (ref 0.0–0.2)

## 2023-10-03 MED ORDER — CEFADROXIL 500 MG PO CAPS
1000.0000 mg | ORAL_CAPSULE | Freq: Once | ORAL | Status: AC
  Administered 2023-10-04: 1000 mg via ORAL
  Filled 2023-10-03: qty 2

## 2023-10-03 MED ORDER — SODIUM CHLORIDE 3 % IV SOLN: INTRAVENOUS | Status: DC

## 2023-10-03 MED ORDER — NONFORMULARY OR COMPOUNDED ITEM
50.0000 mL | Freq: Every day | Status: DC
Start: 2023-10-03 — End: 2023-10-04
  Administered 2023-10-03: 50 mL via TOPICAL
  Filled 2023-10-03 (×3): qty 1

## 2023-10-03 MED ORDER — AMLODIPINE BESYLATE 5 MG PO TABS
5.0000 mg | ORAL_TABLET | Freq: Every day | ORAL | Status: DC
  Administered 2023-10-03 – 2023-10-04 (×2): 5 mg via ORAL
  Filled 2023-10-03 (×3): qty 1

## 2023-10-03 MED ORDER — LINEZOLID 600 MG PO TABS
600.0000 mg | ORAL_TABLET | Freq: Two times a day (BID) | ORAL | Status: DC
Start: 2023-10-03 — End: 2023-10-04
  Administered 2023-10-03 – 2023-10-04 (×2): 600 mg via ORAL
  Filled 2023-10-03 (×4): qty 1

## 2023-10-03 MED ORDER — FUROSEMIDE 40 MG PO TABS
80.0000 mg | ORAL_TABLET | ORAL | Status: DC
  Administered 2023-10-04: 80 mg via ORAL
  Filled 2023-10-03 (×3): qty 2

## 2023-10-03 NOTE — Progress Notes (Signed)
 PT Cancellation Note  Patient Details Name: Laura [name] MRN: [id number] DOB: Jan 26, 1945   Cancelled Treatment:    Reason Eval/Treat Not Completed: Other (comment)  Scheduled for physical therapy follow-up today. Spoke with Tax inspector via secure chat. Patient has difficulty connecting to ipad when she is alone and mobilizing without paramedic/EMT there to supervise. Advised attempting PT follow-up visit tomorrow. Will attempt to schedule tomorrow when patient has assistance to allow productive follow-up.  Leontine Roads, PT, DPT [facility] Physical Therapist Office: [phone] Website: [email or web address]  Leontine GORMAN Roads 10/03/2023, 4:08 PM

## 2023-10-03 NOTE — Plan of Care (Signed)
  Problem: Fluid Volume: Goal: Ability to maintain a balanced intake and output will improve Outcome: Progressing   Problem: Nutritional: Goal: Maintenance of adequate nutrition will improve Outcome: Progressing   Problem: Education: Goal: Knowledge of General Education information will improve Description: Including pain rating scale, medication(s)/side effects and non-pharmacologic comfort measures Outcome: Progressing   Problem: Clinical Measurements: Goal: Will remain free from infection Outcome: Progressing   Problem: Clinical Measurements: Goal: Ability to maintain clinical measurements within normal limits will improve Outcome: Progressing   Problem: Nutrition: Goal: Adequate nutrition will be maintained Outcome: Progressing   Problem: Pain Managment: Goal: General experience of comfort will improve and/or be controlled Outcome: Progressing

## 2023-10-03 NOTE — Progress Notes (Addendum)
 [facility] Daily Progress Note   Patient: Laura [name]  MRN: [id number]  DOB: 1945/05/03  DOA: 09/28/2023  DOS: the patient was seen and examined on @TODAY @    Patient identified themself as Laura [name]  DOB 1945/04/02  Medic Richerd Batty present at bedside during encounter and performed the physical exam.  Patient was seen today via video chat; my physical location [place], [place]   Brief hospital course:  Laura [name] is a 78 y.o. female with a PMH significant for type 2 diabetes mellitus, rheumatoid arthritis on chronic immunosuppressive therapy, who is admitted to [facility] on 09/28/2023 with cellulitis of the right hand.   She has a history of rheumatoid arthritis and is on chronic immunosuppressive therapy with q. weekly methotrexate  in addition to daily hydroxychloroquine .  She is also on chronic prednisone  therapy.   ED Course:  Vital signs in the ED were notable for the following: Temperature max 29.3; rates in the 70s; systolic blood pressures initially in the 90s, subsidy increasing to the 130s following interval IV fluids, as further quantified below; respiratory rate 18-20; O2 saturation 95 to 100% room air.   Labs were notable for the following: CMP notable for the following: Creatinine 1.59 compared to 1.80 on 06/21/2023, glucose 116, liver enzymes were within normal limits.  Initial lactate 0.8, with repeat value noted to be 1.0.  CBC notable for white blood cell count 9800, hemoglobin 12.  Urinalysis associated with cloudy appearing specimen, greater than 50 red blood cells, moderate leukocyte esterase, many bacteria, no evidence of squamous epithelial cells, negative for protein, 6-10 RBCs and was positive for hyaline cast.  Urine culture collected.   Plain film of the right 3rd finger showed soft tissue swelling without evidence of subcutaneous gas, no evidence of warm body, and no evidence of acute osteomyelitis or acute fracture.  Noncontrast  CT head showed no evidence of acute intracranial process.   09/29/23:  Hand-surgeon has evaluated today and brought to OR for I&D.    09/30/23:  S/p I&D. Wound dressing in place. Transferred to [facility] program.  Continued on empiric IV Vancomycin  and Rocephin  pending cultures.  9/21: Continued on Vanc/Rocephin   9/22: Transitioned to IV Linezolid  based on abscess cultures.  Stop Rocephin .  Completed adequate UTI treatment.  Pt's finger showing improvement on exam.  9/23: Clinically improving.  ID consulted.  New left foot dependent edema   Assessment and Plan:  R 3rd digit purulent cellulitis with tenosynovitis / septic joint.  Started as paronychia. S/p I&D on 09/29/23 by Dr. Murrell hand surgeon --Transition empiric IV rocephin  and vancomycin  >> IV Linezolid  --Hand surgeon follow up -- family request in [place], local for convenience -- refer to [facility] Dr. Francisco --Prior to transfer to [facility], case was discussed w/ Dr. Murrell- recommended extended course of IV antibiotics in setting of chronic immunosuppression.  ---Appreciate ID consult today - see consult note for options of therapy.  Will be prolonged course with both PO and IV options. --Plan for challenge with cefadroxil  tomorrow on AM visit.  Pt has tolerated cephalosporins in the past, note distant PCN allergy   Acute cystitis: culture grew pan-sensitive E coli --Completed IV rocephin   No active complaints of dysuria or abdominal  Basline urinary incontinence as well as chronic immunosuppression is a confounding issue    Spinal Stenosis  Ambulatory dysfunction - due to above and likely generalized deconditioning Baseline spinal stenosis in setting of RA (Severe central spinal canal stenosis and  severe bilateral lateral recess stenosis at L3-4 with tangling of the nerve roots of the cauda equina and compression of the L4 nerves in the lateral recesses bilaterally on 08/2023 MRI L Spine) severe with elements of functional  paraplegia per the daughter  Patient ambulates with a wheelchair and rolling walker per the daughter At functional baseline at present   CAD  Noted prior history of CAD s/p LHC 11/2022 w/ Prox RCA lesion 50% stenosed, Mid RCA lesion 25% stenosed.  No indication for antiplatelet therapy at that time No active chest pain  Monitor   Acute metabolic encephalopathy: improved.  Mentation at baseline, with some memory loss. Secondary to infection(s)    Acute Kidney Injury:  resolved with IV fluids improving. Cr 1.59>1.42>1.11>0.93>0.88 s/p IVF Monitor BMP    Type 2 Diabetes Mellitus:  Repeat hgb A1c is 6.2. diet controlled Monitor    Acquired hypothyroidism:  - continue Synthroid  as outpatient    Hyperlipidemia:  - continue home statin   Theumatoid arthritis: increases infection risk - Holding plaquenil , methotrexate  in the interim     Depression:  - continue home doxepin  and Cymbalta  for now, as above.   Obesity Body mass index is 39.22 kg/m. Complicates overall care and prognosis   Patient Active Problem List   Diagnosis Date Noted   Neurogenic bladder 01/09/2023    Priority: High   Neurogenic incontinence 01/09/2023    Priority: High   Generalized osteoarthritis of multiple sites 12/09/2020    Priority: High   Chronic low back pain (1ry area of Pain) (Bilateral) (L>R) w/o sciatica 12/09/2020    Priority: High   Ligamentum flavum hypertrophy (L1-2, L2-3, L3-4) 05/28/2020    Priority: High   Lumbar lateral recess stenosis (Bilateral) (L2-3, L3-4, L4-5) (Severe: L3-4) 05/28/2020    Priority: High   Lumbar Grade 1 Anterolisthesis of L3/L4 and L4/L5 05/28/2020    Priority: High   Intractable back pain 05/28/2020    Priority: High   DDD (degenerative disc disease), lumbosacral 03/10/2020    Priority: High   Abnormal MRI, lumbar spine (04/30/2021) 02/19/2020    Priority: High   Chronic lower extremity pain (2ry area of Pain) (Bilateral) 02/04/2020    Priority:  High   Lumbosacral radiculopathy at L5 (Left) 02/04/2020    Priority: High   Chronic anticoagulation (Plaquenil ) 01/20/2020    Priority: High   Chronic arthralgias of knees and hips (Right) 08/18/2016    Priority: High   Osteoarthritis of knee (Right) 01/25/2016    Priority: High   Elevated C-reactive protein (CRP) 07/15/2015    Priority: High   Chronic hip pain (Left) 04/06/2015    Priority: High   History of TKR (total knee replacement) (Left) 02/09/2015    Priority: High   History of femur fracture (Right) 02/09/2015    Priority: High   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) 02/09/2015    Priority: High   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) 02/09/2015    Priority: High   Lumbar foraminal stenosis (Bilateral L3-4 and L5-S1) 02/09/2015    Priority: High   Chronic low back pain (1ry area of Pain) (Bilateral) (L>R) w/ sciatica (Bilateral) 01/22/2015    Priority: High   Chronic pain syndrome 11/25/2014    Priority: High   Lumbar spondylosis 11/07/2014    Priority: High   Lumbar facet syndrome (Bilateral) (L>R) 11/07/2014    Priority: High   Chronic knee pain (Right) 11/05/2014    Priority: High   History of difficult venous access 04/12/2023  Priority: Medium    Pharmacologic therapy 07/11/2018    Priority: Medium    Disorder of skeletal system 07/11/2018    Priority: Medium    Problems influencing health status 07/11/2018    Priority: Medium    Opioid-induced constipation (OIC) 04/21/2016    Priority: Medium    Uncomplicated opioid dependence (HCC) 11/07/2014    Priority: Medium    Opiate use (30 MME/Day) 11/05/2014    Priority: Medium    Long term current use of opiate analgesic 11/05/2014    Priority: Medium    Encounter for therapeutic drug level monitoring 11/05/2014    Priority: Medium    Disturbance of skin sensation 07/15/2015    Priority: Low   Elevated sedimentation rate 07/15/2015    Priority: Low   Stool incontinence 10/28/2022    Priority:  1.   Midline low back pain 02/01/2022    Priority: 1.   Sacral insufficiency fracture 02/01/2022    Priority: 1.   Fracture of transverse process of lumbar vertebra, closed, initial encounter (HCC) 02/01/2022    Priority: 1.   Ambulatory dysfunction 02/01/2022    Priority: 1.   Lumbar spinal stenosis 10/29/2022    Priority: 2.   Chronic back pain 02/01/2022    Priority: 2.   Long term prescription opiate use 11/05/2014    Priority: 2.   Acute cystitis 09/29/2023   Acute metabolic encephalopathy 09/29/2023   AKI (acute kidney injury) 09/29/2023   Cellulitis of right upper extremity 09/28/2023   Acute lower UTI 10/29/2022   Rheumatoid arthritis (HCC) 10/29/2022   DM2 (diabetes mellitus, type 2) (HCC) 10/29/2022   Mild cognitive impairment 06/03/2022   Heberden's nodes 06/03/2022   Displaced fracture of right femoral neck (HCC) 02/02/2022   Fall at home, initial encounter 02/01/2022   Spinal stenosis at L4-L5 level 02/01/2022   Lymphedema 04/26/2021   Chronic use of opiate for therapeutic purpose 04/14/2020   Statin myopathy 03/20/2020   Cerebrovascular accident (CVA) (HCC) 01/02/2020   Sciatica (Left) 12/31/2019   Dropfoot (Left) 11/20/2019   Weakness of foot (Left) 11/20/2019   Weakness of leg (Left) 11/20/2019   Foot drop (Left) 11/20/2019   Chronic hand pain (Left) 10/10/2019   MDD (major depressive disorder), recurrent, in full remission 11/22/2018   Paroxysmal atrial fibrillation (HCC) 10/16/2018   Coronary artery disease involving native coronary artery of native heart without angina pectoris 10/15/2018   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   Pneumonia 10/06/2018   Immunosuppression 09/18/2018   MDD (major depressive disorder), recurrent episode, mild 07/09/2018   Primary insomnia 07/09/2018   Bereavement 07/09/2018   Body mass index (BMI) of 40.0-44.9 in adult (HCC) 10/09/2017   Postmenopausal 04/06/2017   Arthritis 07/11/2016   Dyspnea on  exertion 07/11/2016   Kidney disease 07/11/2016   Fibromyalgia syndrome 07/11/2016   Snoring 07/11/2016   Morbid obesity with BMI of 40.0-44.9, adult (HCC) 07/11/2016   Diet-controlled diabetes mellitus (HCC) 10/03/2015   GAD (generalized anxiety disorder) 09/24/2015   Lumbar spinal stenosis (5 mm Severe L3-4; 8 mm L4-5) w/ neurogenic claudication 11/07/2014   Morbid obesity (HCC) 10/27/2014   Extreme obesity (HCC) 10/07/2014   Type 2 diabetes mellitus (HCC) 03/19/2014   Depression 10/14/2013   Essential (primary) hypertension 10/14/2013   Insomnia secondary to chronic pain 07/09/2013   Anxiety 07/09/2013   GERD (gastroesophageal reflux disease) 01/18/2013   Acquired hypothyroidism 01/18/2013   HLD (hyperlipidemia) 01/18/2013   Seronegative rheumatoid arthritis (HCC) 01/18/2013   CKD (chronic kidney  disease) stage 3, GFR 30-59 ml/min (HCC) 01/17/2013   Congestive heart failure with left ventricular systolic dysfunction (HCC) 11/10/2012           Subjective: Pt seen virtually during AM Medic home visit today.  She reports 7 out of 10 pain in her finger last night.  Thinks she had some chills, but no fevers.  Reports good appetite, slept on and off.  Team present note left foot swelling possibly from leg dangling off the bed.  Pt denies any other acute complaints.   Physical Exam:    10/03/2023    1:11 PM 10/02/2023   11:00 PM 10/02/2023   12:30 PM  Vitals with BMI  Weight   --  Systolic 158 171 827  Diastolic 97 76 79  Pulse  74 73     General exam: awake, alert, no acute distress HEENT: moist mucus membranes, hearing grossly normal  Respiratory system: CTAB, on room air, normal respiratory effort. Cardiovascular system: normal S1/S2, RRR, BLE nonpitting edema   Gastrointestinal system: soft, non-tender Central nervous system: A&O no gross focal neurologic deficits, normal speech Psychiatry: normal mood, congruent affect Extremities - right hand images below show  erythema and swelling improving                          Data Reviewed:  Notable labs --- bicarb 21, glucose 123, Ca 8.3 otherwise normal BMP. K trending up, borderline 5.0 Hbg stable 10.8  Family Communication: Daughter Vertell was updated by phone afternoon 9/22, 9/23   Disposition: Status is: Inpatient Remains inpatient appropriate because: remains on IV antibiotics pending further clinical improvement given underlying chronic immunosuppression and severity of cellulitis with tenosynovitis.   Planned Discharge Destination: Home with Home Health    Time spent: 45 minutes  Author: Burnard DELENA Cunning, DO [facility]  08/31/2023 7:00 AM  For on call review [email or web address].

## 2023-10-03 NOTE — Plan of Care (Signed)

## 2023-10-03 NOTE — Progress Notes (Signed)
 Upon arrival to the pt's home she was noted to be laying in her bed. The pt was alert and oriented x4. Her skin was warm,dry and normal in color. She had a strong radial pulse and was breathing normally. Vitals were taken and are as noted. The pt's finger was un-bandaged, cleaned and pictured were taken of same. Medications were given as noted. Video call with the nurse was done and the pt stated she was having generalized pain that did not subside with pain medication. The pt was asked if there was anything else that could be done for her after iv anti-biotics were done. She stated no.  IMM letter was explained and signed. The pt's copy was placed on top of their dresser.  The pt and her husband were told to call back if anything were to change and they agreed to same.

## 2023-10-03 NOTE — Consult Note (Signed)
 [facility]    Date of Admission:  09/28/2023     Reason for Consult: septic arthritis/om    Referring Provider: Fausto Sor       Abx: 9/22-c Linezolid   9/18-22 ceftriaxone  9/18-22 vanc         Assessment: Right middle finger septic arthritis/om RA immunosuppressed Pcn allergy  Xray showed no obvious osseous abnormality, but might be too early. And also given immunosuppression and operative finding needing to go into the dip joint, I plan to treat 6 weeks  Options as below and we'll see what her home health pharmacy decide what is safe/feasible    Plan: If safe/feasible, given low risk pcn reaction, can try oral challenge with cefadroxil . If ok, then continue zyvox  for 2 weeks from 9/19 to 10/03; on 10/03 finish 4 more weeks of cefadroxil  1000 mg po bid Other options include cefazolin  4 weeks from 9/19 and finish 2 more weeks of zyvox , or 2 weekly shots of dalbavancin 1500 mg Id clinic f/u 10/27 @ 330 with me Maintain standard isolation precaution Discussed with primary team Id will sign off   I connected with  Laura [name] on 10/03/2023  I verified that I was speaking with the correct person using two identifiers. Due to the nature of hospital at home, this service was provided via telemedicine using audio/visual media.   The patient was located at home. The provider was located in the hospital. The patient did consent to this visit and is aware of charges through their insurance as well as the limitations of evaluation and management by telemedicine. Other persons participating in this telemedicine service were none. Time spent on visit was greater than 70 minutes on media and in coordination of care    ------------------------------------------------ Principal Problem:   Cellulitis of right upper extremity Active Problems:   Depression   Acquired hypothyroidism   HLD (hyperlipidemia)   Rheumatoid arthritis  (HCC)   DM2 (diabetes mellitus, type 2) (HCC)   Acute cystitis   Acute metabolic encephalopathy   AKI (acute kidney injury)    HPI: Laura [name] is a 78 y.o. female with ra on methotrexate  prednisone , recent admission 9/18 for right finger paronychia that turned into tenosynovitis s/p I&D cx mssa, pcn allergy, id consulted for mangement  Patient currently at home on hospital at home program I did televideo consult today  She had 1 week sx that got worsened before admission No f/c  Reviewed operative note from dr murrell: Incision was made at the radial side of the long finger over the DIP joint. This is carried in subcutaneous tissues by spreading technique. The subcutaneous tissues were indurated and thick. The DIP joint was entered. There was fluid within the joint but not gross purulence. The dorsal soft tissues were opened. There was a small wound at the ulnar side of the finger. An incision was made including this wound. There was small amount of gross purulence. Cultures were taken for aerobes and anaerobes. Wound was debrided to remove devitalized skin using the scissors. This wound was opened and communicated with the dorsal wound. The wound and joint were copiously irrigated with sterile saline by cystoscopy tubing. Half-inch iodoform gauze was packed into the wound including a wick in the DIP joint. Digital block was performed with quarter percent plain Marcaine  to aid in postoperative analgesia. The wounds were dressed with sterile 4 x 4 and wrapped with a Coban dressing lightly   She  was discharged to Hospital at home taking zyvox   This is initial ID consult Operative cx reviewed --> mssa (S doxy, bactrim)  Hx pcn allergy about 20 years ago generalized rash. Doesn't recall taking any products of pcn subsequently Tolerating ceftriaxone  and cefazolin  recently  No n/v/d Finger for her appears to be better  Family History  Problem Relation Age of Onset   Hypertension Mother     Stroke Mother    Heart attack Father    Alcohol abuse Father    Depression Father    Post-traumatic stress disorder Father    Rheum arthritis Sister    Hypertension Sister    Depression Sister    Breast cancer Neg Hx     Social History   Tobacco Use   Smoking status: Never   Smokeless tobacco: Never  Vaping Use   Vaping status: Never Used  Substance Use Topics   Alcohol use: No    Alcohol/week: 0.0 standard drinks of alcohol   Drug use: Never    Allergies  Allergen Reactions   Bupropion Other (See Comments)    Siezures   Jardiance [Empagliflozin] Nausea And Vomiting and Other (See Comments)    Severe Projectile vomiting   Penicillins Nausea And Vomiting and Rash   Statins Diarrhea and Other (See Comments)    Intolerance at higher doses and frequency.    Melatonin Other (See Comments)    Gagging, nausea and vomiting the following day    Review of Systems: ROS All Other ROS was negative, except mentioned above   Past Medical History:  Diagnosis Date   Allergy    Anxiety    Aortic atherosclerosis    Arthritis, degenerative 10/08/2013   Asthma    Cardiomegaly    Chronic kidney disease, stage 3 (HCC)    Chronic, continuous use of opioids    a.) followed by pain management; b.) has naloxone  Rx available   Coronary artery disease involving native coronary artery of native heart without angina pectoris    a.) MV 11/08/2022: mild ischemia in apical septal wall (TID 0.94); b.) LHC 11/24/2022: minor luminal irregs LM/LAD/LCx, 50% pRCA, 25% mRCA - med mgmt   DDD (degenerative disc disease), lumbar    Depression    Diet-controlled type 2 diabetes mellitus (HCC)    Essential hypertension    GERD (gastroesophageal reflux disease)    Heberden's nodes    HFrEF (heart failure with reduced ejection fraction) (HCC)    a.) TTE 10/09/2018: EF 55-60%, no RWMAS, mild RVE, mild BAE, triv MR/TR, RVSP 48.7; b.) TTE 03/18/2021: EF >55%, no RWMAs, G!DD, triv AR/TR/PR, mild MR    History of bilateral cataract extraction    Hypothyroidism    Insomnia    Long term methotrexate  user    Long-term use of aspirin  therapy    Long-term use of hydroxychloroquine     Long-term use of infliximab    Lumbar spinal stenosis with neurogenic claudication 11/07/2014   Memory loss    Memory loss, short term 03/19/2014   Mixed hyperlipidemia    MRSA infection    upper lip   OSA on CPAP    Osteoarthritis    Paroxysmal atrial fibrillation (HCC)    a.) occurred in setting of PNA and hypokalemia; no recurrence   Pneumonia    Pulmonary hypertension (HCC)    a.) TTE 10/09/2018: RVSP 48.7 mmHg   Renal cyst, left    Rheumatoid arthritis (HCC)    a.) on hydryoxychloroqine + MTX + infliximab   Sciatica  of left side 12/31/2019   Seizure (HCC) 10/07/2014   one time episode from wellbutrin   Stool incontinence 10/2022   Umbilical hernia    Urinary incontinence    Vitamin D  deficiency        Scheduled Meds:  amLODipine   5 mg Oral Daily   vitamin C   1,000 mg Oral Daily   vitamin B-12  1,000 mcg Oral Daily   doxepin   50 mg Oral QHS   DULoxetine   60 mg Oral Daily   enoxaparin  (LOVENOX ) injection  50 mg Subcutaneous Daily   famotidine   20 mg Oral Daily   [START ON 10/04/2023] furosemide   80 mg Oral Q M,W,F   levothyroxine   250 mcg Oral QAC breakfast   loratadine   10 mg Oral Daily   metoprolol  succinate  100 mg Oral Daily   Sodium chloride  3 % for irrigation  50 mL Topical Daily   Continuous Infusions:  linezolid  (ZYVOX ) IV Stopped (10/03/23 1402)   PRN Meds:.acetaminophen  **OR** acetaminophen , hydrALAZINE , Influenza vac split trivalent PF, oxyCODONE    OBJECTIVE: Blood pressure (!) 158/97, pulse 74, temperature 98 F (36.7 C), temperature source Oral, resp. rate 18, height 5' 5 (1.651 m), weight 104 kg, SpO2 97%.  Physical Exam General/constitutional: no distress, pleasant HEENT: Normocephalic Neck supple Lungs: normal respiratory effort Ext: bilateral 1+ pedal  edema Skin: right finger incision seems to have closed       Neuro: nonfocal MSK: slight swelling/redness of the right middle finger    Lab Results Lab Results  Component Value Date   WBC 8.0 10/01/2023   HGB 11.0 (L) 10/01/2023   HCT 35.1 (L) 10/01/2023   MCV 91.4 10/01/2023   PLT 252 10/01/2023    Lab Results  Component Value Date   CREATININE 0.88 10/02/2023   BUN 16 10/02/2023   NA 137 10/02/2023   K 5.0 10/02/2023   CL 105 10/02/2023   CO2 21 (L) 10/02/2023    Lab Results  Component Value Date   ALT 8 09/29/2023   AST 12 (L) 09/29/2023   ALKPHOS 53 09/29/2023   BILITOT 0.7 09/29/2023      Microbiology: Recent Results (from the past 240 hours)  Urine Culture     Status: Abnormal   Collection Time: 09/28/23  8:55 PM   Specimen: Urine, Random  Result Value Ref Range Status   Specimen Description URINE, RANDOM  Final   Special Requests   Final    NONE Reflexed from [id number] Performed at [facility], [address], [place], [place] [address]    Culture >=100,000 COLONIES/mL ESCHERICHIA COLI (A)  Final   Report Status 09/30/2023 FINAL  Final   Organism ID, Bacteria ESCHERICHIA COLI (A)  Final      Susceptibility   Escherichia coli - MIC*    AMPICILLIN <=2 SENSITIVE Sensitive     CEFAZOLIN  (URINE) Value in next row Sensitive      2 SENSITIVEThis is a modified FDA-approved test that has been validated and its performance characteristics determined by the reporting laboratory.  This laboratory is certified under the Clinical Laboratory Improvement Amendments CLIA as qualified to perform high complexity clinical laboratory testing.    CEFEPIME  Value in next row Sensitive      2 SENSITIVEThis is a modified FDA-approved test that has been validated and its performance characteristics determined by the reporting laboratory.  This laboratory is certified under the Clinical Laboratory Improvement Amendments CLIA as qualified to perform high complexity clinical  laboratory testing.  ERTAPENEM Value in next row Sensitive      2 SENSITIVEThis is a modified FDA-approved test that has been validated and its performance characteristics determined by the reporting laboratory.  This laboratory is certified under the Clinical Laboratory Improvement Amendments CLIA as qualified to perform high complexity clinical laboratory testing.    CEFTRIAXONE  Value in next row Sensitive      2 SENSITIVEThis is a modified FDA-approved test that has been validated and its performance characteristics determined by the reporting laboratory.  This laboratory is certified under the Clinical Laboratory Improvement Amendments CLIA as qualified to perform high complexity clinical laboratory testing.    CIPROFLOXACIN  Value in next row Sensitive      2 SENSITIVEThis is a modified FDA-approved test that has been validated and its performance characteristics determined by the reporting laboratory.  This laboratory is certified under the Clinical Laboratory Improvement Amendments CLIA as qualified to perform high complexity clinical laboratory testing.    GENTAMICIN Value in next row Sensitive      2 SENSITIVEThis is a modified FDA-approved test that has been validated and its performance characteristics determined by the reporting laboratory.  This laboratory is certified under the Clinical Laboratory Improvement Amendments CLIA as qualified to perform high complexity clinical laboratory testing.    NITROFURANTOIN Value in next row Sensitive      2 SENSITIVEThis is a modified FDA-approved test that has been validated and its performance characteristics determined by the reporting laboratory.  This laboratory is certified under the Clinical Laboratory Improvement Amendments CLIA as qualified to perform high complexity clinical laboratory testing.    TRIMETH/SULFA Value in next row Sensitive      2 SENSITIVEThis is a modified FDA-approved test that has been validated and its performance  characteristics determined by the reporting laboratory.  This laboratory is certified under the Clinical Laboratory Improvement Amendments CLIA as qualified to perform high complexity clinical laboratory testing.    AMPICILLIN/SULBACTAM Value in next row Sensitive      2 SENSITIVEThis is a modified FDA-approved test that has been validated and its performance characteristics determined by the reporting laboratory.  This laboratory is certified under the Clinical Laboratory Improvement Amendments CLIA as qualified to perform high complexity clinical laboratory testing.    PIP/TAZO Value in next row Sensitive      <=4 SENSITIVEThis is a modified FDA-approved test that has been validated and its performance characteristics determined by the reporting laboratory.  This laboratory is certified under the Clinical Laboratory Improvement Amendments CLIA as qualified to perform high complexity clinical laboratory testing.    MEROPENEM Value in next row Sensitive      <=4 SENSITIVEThis is a modified FDA-approved test that has been validated and its performance characteristics determined by the reporting laboratory.  This laboratory is certified under the Clinical Laboratory Improvement Amendments CLIA as qualified to perform high complexity clinical laboratory testing.    * >=100,000 COLONIES/mL ESCHERICHIA COLI  Aerobic/Anaerobic Culture w Gram Stain (surgical/deep wound)     Status: None (Preliminary result)   Collection Time: 09/29/23  1:18 PM   Specimen: PATH Soft tissue  Result Value Ref Range Status   Specimen Description ABSCESS  Final   Special Requests RIGHT LONG FINGER  Final   Gram Stain   Final    NO WBC SEEN RARE GRAM POSITIVE COCCI IN PAIRS Performed at [facility], [address] [place], [place] [address]    Culture   Final    FEW STAPHYLOCOCCUS AUREUS NO ANAEROBES  ISOLATED; CULTURE IN PROGRESS FOR 5 DAYS    Report Status PENDING  Incomplete   Organism ID, Bacteria  STAPHYLOCOCCUS AUREUS  Final      Susceptibility   Staphylococcus aureus - MIC*    CIPROFLOXACIN  4 RESISTANT Resistant     ERYTHROMYCIN RESISTANT Resistant     GENTAMICIN <=0.5 SENSITIVE Sensitive     OXACILLIN 0.5 SENSITIVE Sensitive     TETRACYCLINE <=1 SENSITIVE Sensitive     VANCOMYCIN  1 SENSITIVE Sensitive     TRIMETH/SULFA <=10 SENSITIVE Sensitive     CLINDAMYCIN RESISTANT Resistant     RIFAMPIN <=0.5 SENSITIVE Sensitive     Inducible Clindamycin POSITIVE Resistant     LINEZOLID  2 SENSITIVE Sensitive     * FEW STAPHYLOCOCCUS AUREUS     Serology:    Imaging: If present, new imagings (plain films, ct scans, and mri) have been personally visualized and interpreted; radiology reports have been reviewed. Decision making incorporated into the Impression / Recommendations.  9/18 xray right middle finger  1. Soft tissue swelling, with no evidence of radiopaque foreign body or soft tissue gas. 2. No radiographic evidence of osteomyelitis. 3. Severe osteoarthritis.  Constance ONEIDA Passer, MD [facility] [phone] pager    10/03/2023, 2:51 PM

## 2023-10-03 NOTE — Progress Notes (Signed)
 Communicated with patient via ipad. Oriented to person, place, and time. She reports 7/10 right middle finger pain. PRN oxycodone  given. No other complaints at this time. Plan of care reviewed with patient and spouse.

## 2023-10-03 NOTE — Progress Notes (Addendum)
 1923-1925--Alarm Hypoxia for SpO2--Patient called x2 -unable to reach Daughter called-unable to reach voicemail left for all calls.  1943-1946-Contact call attempt via [employer] tablet- unable to reach patient.  2009-2012- Contact call to patient to inform field team arrival. No answer, left message to call. Contact attempt via current health tablet, no answer. Contact call to daughter Terence), daughter reached, confirmed patient and caregiver (patient's spouse) were sleeping. Daughter informed of need for assessment and medication administration. Daughter agreed to go to the home to meet [facility] field team to assist with entering the home.  2048-contact call to daughter to inform EMT has arrived. Daughter Terence) reported she is inside the home waiting for EMT.  2051- 2023--Patient contact via video call-Patient identifiers review  patient alert, responding to commands. EMT, spouse and daughter at bedside for part of video call.  Medication administration with RN oversight and assessment completed.  Patient/caregiver denies any health concerns or issues with finger. Patient pain reported at 5. Pain medication administration provided patient denied Tylenol , reported not as effective for pain, requested Oxycodone . Overnight monitoring plan reviewed.  Discussed with patient and spouse the importance of contact via phone and current health tablet is in the program.  Best contact information reviewed, patient and spouse confirmed best contact is the home number and can be reached at [phone] overnight. The spouse confirmed the phone is on the hook and cell is within reach. Daughter will also be contacted overnight as needed. Per Patient /caregiver no other questions or concerns at the time of video call reported. Patient /caregiver encouraged to call as needed and will continue to be monitored via current health monitoring.

## 2023-10-03 NOTE — Hospital Course (Signed)
 CC: redness right 3rd finger HPI:  Laura [name] is a 78 y.o. female with medical history significant for type 2 diabetes mellitus, rheumatoid arthritis on chronic immunosuppressive therapy, who is admitted to [facility] on 09/28/2023 with cellulitis of the right hand after presenting from home to [facility] ED complaining of redness involving the right third finger.    In the setting of the patient's presenting altered mental status, the following history is provided by the patient's daughter, in addition to my discussions with the EDP and via chart review.   Patient presents for evaluation of erythema involving the right hand.  Initially, starting approximately 3 days ago, the patient noted erythema, swelling, increased warmth, tenderness involving the third finger on the right hand just proximal to the nailbed, and approximately at the level of the DIP joint.  However, over the last 3 days, this erythema, increased warmth, swelling, tenderness has progressed to extension of these symptoms approximately down the right finger and into the dorsal aspect of the right hand.  No involvement of the right wrist, including a impairment of flexion/extension of the right wrist.  This is within the acute focal numbness or paresthesias involving the right hand.  Denies rash or any other location.   She has a history of rheumatoid arthritis and is on chronic immunosuppressive therapy with q. weekly methotrexate  in addition to daily hydroxychloroquine .  She is also on chronic prednisone  therapy.   Medical history also notable for type 2 diabetes mellitus, which is managed via lifestyle modifications.   Patient's daughter conveys that the patient has a.  Confused relative to baseline normal status over the last 2 to 3 days.  Daughter also conveys that patient has a history of recurrent urinary tract infections and has been complaining over the course the last week of new onset dysuria.   Outpatient medications  are also notable for doxepin , Cymbalta .  Additionally, she has chronic pain syndrome, and is on chronic opioid therapy via oxycodone  IR.   Per chart review, most recent echocardiogram occurred in September 2020 was notable for LVEF 55 to 60% as well as normal right ventricular systolic function.   She is on a daily baby aspirin  at home, but otherwise no additional blood thinners as an outpatient.  ED Course:  Vital signs in the ED were notable for the following: Temperature max 29.3; rates in the 70s; systolic blood pressures initially in the 90s, subsidy increasing to the 130s following interval IV fluids, as further quantified below; respiratory rate 18-20; O2 saturation 95 to 100% room air.   Labs were notable for the following: CMP notable for the following: Creatinine 1.59 compared to 1.80 on 06/21/2023, glucose 116, liver enzymes were within normal limits.  Initial lactate 0.8, with repeat value noted to be 1.0.  CBC notable for white blood cell count 9800, hemoglobin 12.  Urinalysis associated with cloudy appearing specimen, greater than 50 red blood cells, moderate leukocyte esterase, many bacteria, no evidence of squamous epithelial cells, negative for protein, 6-10 RBCs and was positive for hyaline cast.  Urine culture collected.   Per my interpretation, EKG in ED demonstrated the following: No EKG performed in the ED today.   Imaging in the ED, per corresponding formal radiology read, was notable for the following: Plain film of the right 3rd finger showed soft tissue swelling without evidence of subcutaneous gas, no evidence of warm body, and no evidence of acute osteomyelitis or acute fracture.  Noncontrast CT head showed no evidence of  acute intracranial process.   EDP d/w on-call hand surgeon, Dr. Murrell, who will formally consult and see pt in the AM, requesting interval iv abx. Dr. Murrell anticipates surgical I&D in the morning.    While in the ED, the following were administered:  Rocephin , 500 cc NS bolus.   Subsequently, the patient was admitted for evaluation and management cellulitis of the right hand, complicated by acute metabolic encephalopathy PE, acute kidney injury, as well as spectated acute cystitis.   Significant Events: Admitted 09/28/2023 for cellulitis right 3rd finger  Admission Labs: Na 133, K 4.2, CO2 of 27, BUN 30, scr 1.59, glu 116 WBC 9.8, HgB 12.0, Plt 341 UA cloudy, Moderate LE, WBC >50 Urine Na <30 UDS negative  Admission Imaging Studies: Xray right middle finger Soft tissue swelling, with no evidence of radiopaque foreign body or soft tissue gas. 2. No radiographic evidence of osteomyelitis. 3. Severe osteoarthritis CT head No acute intracranial process. Chronic small vessel ischemic changes as above. Renal U/S No hydronephrosis bilaterally. 2. Bilateral renal atrophy with right renal cyst. 3. Slightly complex with internal echoes lesion in the upper pole of the left kidney with indeterminate imaging characteristics. MRI is recommended for further evaluation to exclude the possibility of underlying neoplasm.  Significant Labs: Urine cx >100K E. Coli. Pan sensitive CRP 1.4, ESR 28 B12 of 342 TSH <0.10 A1c of 6.2% CK 38 Abscess culture growing MSSA. Sensitive to oxacillin, resistant to clindamycin, cipro . Positive for inducible clindamycin gene Fe 26, TIBC 308, %sat 8  Antibiotic Therapy: Anti-infectives (From admission, onward)    Start     Dose/Rate Route Frequency Ordered Stop   10/04/23 0800  cefadroxil  (DURICEF) capsule 1,000 mg        1,000 mg Oral  Once 10/03/23 1716     10/03/23 2200  linezolid  (ZYVOX ) tablet 600 mg        600 mg Oral Every 12 hours 10/03/23 1515     10/02/23 1800  vancomycin  (VANCOCIN ) IVPB 1000 mg/200 mL premix  Status:  Discontinued        1,000 mg 200 mL/hr over 60 Minutes Intravenous Every 24 hours 10/01/23 2340 10/02/23 0715   10/02/23 1000  linezolid  (ZYVOX ) IVPB 600 mg  Status:  Discontinued         600 mg 300 mL/hr over 60 Minutes Intravenous Every 12 hours 10/02/23 0715 10/03/23 1515   10/01/23 1000  vancomycin  (VANCOREADY) IVPB 1500 mg/300 mL  Status:  Discontinued        1,500 mg 150 mL/hr over 120 Minutes Intravenous Every 48 hours 09/28/23 2355 10/01/23 2340   09/29/23 2200  cefTRIAXone  (ROCEPHIN ) 1 g in sodium chloride  0.9 % 100 mL IVPB  Status:  Discontinued        1 g 200 mL/hr over 30 Minutes Intravenous Every 24 hours 09/28/23 2340 10/02/23 0714   09/29/23 0200  vancomycin  (VANCOREADY) IVPB 2000 mg/400 mL        2,000 mg 200 mL/hr over 120 Minutes Intravenous  Once 09/28/23 2355 09/29/23 0810   09/28/23 2145  cefTRIAXone  (ROCEPHIN ) 1 g in sodium chloride  0.9 % 100 mL IVPB        1 g 200 mL/hr over 30 Minutes Intravenous  Once 09/28/23 2136 09/28/23 2228      Procedures: I&D right middle finger  Consultants: Orthopedics Infectious disease

## 2023-10-03 NOTE — Assessment & Plan Note (Signed)
 Body mass index is 38.15 kg/m. This complicates overall care and prognosis.

## 2023-10-04 ENCOUNTER — Other Ambulatory Visit (HOSPITAL_COMMUNITY): Payer: Self-pay

## 2023-10-04 DIAGNOSIS — A4901 Methicillin susceptible Staphylococcus aureus infection, unspecified site: Secondary | ICD-10-CM

## 2023-10-04 LAB — AEROBIC/ANAEROBIC CULTURE W GRAM STAIN (SURGICAL/DEEP WOUND): Gram Stain: NONE SEEN

## 2023-10-04 LAB — ACID FAST SMEAR (AFB, MYCOBACTERIA): Acid Fast Smear: NEGATIVE

## 2023-10-04 MED ORDER — LINEZOLID 600 MG PO TABS: 600.0000 mg | ORAL_TABLET | Freq: Two times a day (BID) | ORAL | 0 refills | Status: AC

## 2023-10-04 MED ORDER — OXYCODONE HCL 5 MG PO TABS
ORAL_TABLET | ORAL | 0 refills | Status: DC
  Filled 2023-10-04: qty 12, 3d supply, fill #0

## 2023-10-04 MED ORDER — ASCORBIC ACID 1000 MG PO TABS: 1000.0000 mg | ORAL_TABLET | Freq: Every day | ORAL | 0 refills | Status: DC

## 2023-10-04 MED ORDER — OXYCODONE HCL 5 MG PO TABS: 5.0000 mg | ORAL_TABLET | Freq: Four times a day (QID) | ORAL | 0 refills | Status: DC | PRN

## 2023-10-04 MED ORDER — CEFADROXIL 500 MG PO CAPS: 1000.0000 mg | ORAL_CAPSULE | Freq: Two times a day (BID) | ORAL | 0 refills | Status: AC

## 2023-10-04 MED FILL — Sodium Chloride IV Soln 3%: INTRAVENOUS | Qty: 500 | Status: AC

## 2023-10-04 NOTE — Progress Notes (Signed)
 Communicated with patient via video tablet. Patient appears comfortable in the chair. PIV removed. AVS paperwork given to patient. Discharge instructions discussed with the patient and spouse. Patient's question answered to satisfaction. Patient agreeable with discharge plan.   Patient's daughter, Vertell made aware that patient was discharge from [facility] program. The daughter had additional questions for the doctor. Dr. Sherre was made aware to call the daughter.

## 2023-10-04 NOTE — Plan of Care (Signed)

## 2023-10-04 NOTE — Assessment & Plan Note (Addendum)
 R 3rd digit purulent cellulitis.  S/p I&D on 09/29/23 by Dr. Murrell hand surgeon Transition empiric IV rocephin  and vancomycin  >> IV Linezolid  Hand surgeon follow up -- family request in [place], local for convenience Prior to transfer to [facility], case was discussed w/ Dr. Murrell- recommended extended course of IV antibiotics in setting of chronic immunosuppression.   Per ID recommendation: linezolid  600 mg PO BID until 10/13/23, followed by cefadroxil  1000 mg BID from 10/13/23 to 11/10/23 Outpatient followup with ID 11/06/23 at 3:30p

## 2023-10-04 NOTE — Assessment & Plan Note (Addendum)
 Resumed plaquenil  on discharge  Hold methotrexate  until follow with rheumatology

## 2023-10-04 NOTE — Progress Notes (Signed)
 Arrived at home and greeted by Husband at front door who allowed me and Paramedic D. Hopkins to enter the home.  Pt was found resting in sitting position in bedroom on bed.  No acute distress or complaints noted.  Patient was alert and orientation to person, place and event.  RR even/unlabored, skin warm/pink/dry.  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  Dressing to right middle finger taken down and wound irrigated with 3%hypertonic saline as ordered.  PT tolerated well without adverse events.   Photos updated in chart for comparison of treatment plan. Left lower extremity noted to be discolored and more edematous than patient baseline.  Recommended elevating and using compression stocking per Dr. Fausto orders.  Video Visit with virtual RN V. McWhite and Dr. Fausto completed at this time.  Patient and husband updated on plan of care and treatment regimen.  Plan to DC tomorrow.  Both verbalized understanding and all questions answered prior to departure.

## 2023-10-04 NOTE — Progress Notes (Signed)
 Arrived to home to find patient sitting in wheelchair in living room.  Answered door and allowed me to enter the home.  Pt was in no acute distress, A&O person, place and event, RR even/unlabored, skin warm/pink/dry. VSS.  Patient left lower extremity edema and discoloration has improved since yesterday's visit and patient statesit feels much better  Dressing to right middle finger clean/dry/intact.  Pt presented with IMM letter and signed verbalizing understanding.  Morning medications administered with virtual RN. Patient updated on plan of care and plan to DC this afternoon after cefadroxil  trial.  Pt verbalized understanding, all questions answered prior to departure.

## 2023-10-04 NOTE — Assessment & Plan Note (Signed)
 Resolved

## 2023-10-04 NOTE — TOC Progression Note (Addendum)
 Transition of Care [facility]) - Progression Note    Patient Details  Name: Laura [name] MRN: [id number] Date of Birth: [birth year]  [facility] CM/SW Contact  Rosalva Jon Bloch, RN Phone Number: 10/04/2023, 3:43 PM  Clinical Narrative:    Chronic Care Management referral made with nurse Neshia [phone]) who facilitates  CCM program @ pt's PCP office. Reeda states they are very familiar with pt and will f/u. IP CM will continue to monitor and assist with needs...    Expected Discharge Plan: Home w Home Health Services Barriers to Discharge: Continued Medical Work up               Expected Discharge Plan and Services         Expected Discharge Date: 10/04/23                         HH Arranged: PT, OT, Nurse's Aide, Social Work [employer] Agency: [facility] Date HH Agency Contacted: 10/04/23 Time HH Agency Contacted: [phone] Representative spoke with at [facility] Agency: Amy   Social Drivers of Health (SDOH) Interventions SDOH Screenings   Food Insecurity: No Food Insecurity (09/29/2023)  Housing: Low Risk  (09/29/2023)  Transportation Needs: No Transportation Needs (09/29/2023)  Utilities: Not At Risk (09/29/2023)  Depression (PHQ2-9): Low Risk  (01/09/2023)  Recent Concern: Depression (PHQ2-9) - Medium Risk (01/09/2023)  Financial Resource Strain: Low Risk  (10/19/2022)   Received from [facility]  Physical Activity: Inactive (11/15/2016)  Social Connections: Moderately Isolated (09/29/2023)  Stress: No Stress Concern Present (11/15/2016)  Tobacco Use: Low Risk  (09/29/2023)    Readmission Risk Interventions     No data to display

## 2023-10-04 NOTE — Discharge Summary (Addendum)
 Physician Discharge Summary   Patient: Laura [name] MRN: [id number] DOB: Mar 05, 1945  Admit date:     09/28/2023  Discharge date: 10/04/23  Discharge Physician: Greig SAILOR Izear Pine   PCP: [name], MD   Patient identified themself as Laura [name], DOB Dec 01, 1945  Medic, Lauraine Faes, present at bedside during encounter and performed the physical exam. Patient was seen today via video chat; my physical location is [place] .   PE:  AAOx3, NAD, moves bilateral upper extremities. Right middle finger with peeling skin, appropriately red tissue appears consistent with woundhealing. Improving ecchymosis of the palmer side of the right hand.   Recommendations at discharge:    Continue linezolid  600 mg PO BID until 10/13/23. Start Cefadroxil  1000 mg PO BID until 11/10/23. Follow with ID outpatient on 11/06/23 at 3:30p Keep wound dry and clean  Discharge Diagnoses:  Principal Problem:   Infection due to non-invasive methicillin sensitive Staphylococcus aureus Active Problems:   Long term current use of opiate analgesic   Immunosuppression - Plaquenil    Abscess of right middle finger   E. coli UTI (urinary tract infection)   Acute metabolic encephalopathy   Acute kidney injury superimposed on stage 3a chronic kidney disease (HCC)   Essential (primary) hypertension   Acquired hypothyroidism   HLD (hyperlipidemia)   Type 2 diabetes mellitus (HCC)   Obesity, Class II, BMI 35-39.9   MDD (major depressive disorder), recurrent episode, mild   Rheumatoid arthritis (HCC)  Resolved Problems:   * No resolved hospital problems. *  Hospital Course: CC: redness right 3rd finger HPI:  Laura [name] is a 78 y.o. female with medical history significant for type 2 diabetes mellitus, rheumatoid arthritis on chronic immunosuppressive therapy, who is admitted to [facility] on 09/28/2023 with cellulitis of the right hand after presenting from home to [facility] ED complaining of  redness involving the right third finger.    In the setting of the patient's presenting altered mental status, the following history is provided by the patient's daughter, in addition to my discussions with the EDP and via chart review.   Patient presents for evaluation of erythema involving the right hand.  Initially, starting approximately 3 days ago, the patient noted erythema, swelling, increased warmth, tenderness involving the third finger on the right hand just proximal to the nailbed, and approximately at the level of the DIP joint.  However, over the last 3 days, this erythema, increased warmth, swelling, tenderness has progressed to extension of these symptoms approximately down the right finger and into the dorsal aspect of the right hand.  No involvement of the right wrist, including a impairment of flexion/extension of the right wrist.  This is within the acute focal numbness or paresthesias involving the right hand.  Denies rash or any other location.   She has a history of rheumatoid arthritis and is on chronic immunosuppressive therapy with q. weekly methotrexate  in addition to daily hydroxychloroquine .  She is also on chronic prednisone  therapy.   Medical history also notable for type 2 diabetes mellitus, which is managed via lifestyle modifications.   Patient's daughter conveys that the patient has a.  Confused relative to baseline normal status over the last 2 to 3 days.  Daughter also conveys that patient has a history of recurrent urinary tract infections and has been complaining over the course the last week of new onset dysuria.   Outpatient medications are also notable for doxepin , Cymbalta .  Additionally, she has chronic pain syndrome, and is on  chronic opioid therapy via oxycodone  IR.   Per chart review, most recent echocardiogram occurred in September 2020 was notable for LVEF 55 to 60% as well as normal right ventricular systolic function.   She is on a daily baby aspirin   at home, but otherwise no additional blood thinners as an outpatient.  ED Course:  Vital signs in the ED were notable for the following: Temperature max 29.3; rates in the 70s; systolic blood pressures initially in the 90s, subsidy increasing to the 130s following interval IV fluids, as further quantified below; respiratory rate 18-20; O2 saturation 95 to 100% room air.   Labs were notable for the following: CMP notable for the following: Creatinine 1.59 compared to 1.80 on 06/21/2023, glucose 116, liver enzymes were within normal limits.  Initial lactate 0.8, with repeat value noted to be 1.0.  CBC notable for white blood cell count 9800, hemoglobin 12.  Urinalysis associated with cloudy appearing specimen, greater than 50 red blood cells, moderate leukocyte esterase, many bacteria, no evidence of squamous epithelial cells, negative for protein, 6-10 RBCs and was positive for hyaline cast.  Urine culture collected.   Per my interpretation, EKG in ED demonstrated the following: No EKG performed in the ED today.   Imaging in the ED, per corresponding formal radiology read, was notable for the following: Plain film of the right 3rd finger showed soft tissue swelling without evidence of subcutaneous gas, no evidence of warm body, and no evidence of acute osteomyelitis or acute fracture.  Noncontrast CT head showed no evidence of acute intracranial process.   EDP d/w on-call hand surgeon, Dr. Murrell, who will formally consult and see pt in the AM, requesting interval iv abx. Dr. Murrell anticipates surgical I&D in the morning.    While in the ED, the following were administered: Rocephin , 500 cc NS bolus.   Subsequently, the patient was admitted for evaluation and management cellulitis of the right hand, complicated by acute metabolic encephalopathy PE, acute kidney injury, as well as spectated acute cystitis.   Significant Events: Admitted 09/28/2023 for cellulitis right 3rd finger  Admission Labs: Na  133, K 4.2, CO2 of 27, BUN 30, scr 1.59, glu 116 WBC 9.8, HgB 12.0, Plt 341 UA cloudy, Moderate LE, WBC >50 Urine Na <30 UDS negative  Admission Imaging Studies: Xray right middle finger Soft tissue swelling, with no evidence of radiopaque foreign body or soft tissue gas. 2. No radiographic evidence of osteomyelitis. 3. Severe osteoarthritis CT head No acute intracranial process. Chronic small vessel ischemic changes as above. Renal U/S No hydronephrosis bilaterally. 2. Bilateral renal atrophy with right renal cyst. 3. Slightly complex with internal echoes lesion in the upper pole of the left kidney with indeterminate imaging characteristics. MRI is recommended for further evaluation to exclude the possibility of underlying neoplasm.  Significant Labs: Urine cx >100K E. Coli. Pan sensitive CRP 1.4, ESR 28 B12 of 342 TSH <0.10 A1c of 6.2% CK 38 Abscess culture growing MSSA. Sensitive to oxacillin, resistant to clindamycin, cipro . Positive for inducible clindamycin gene Fe 26, TIBC 308, %sat 8  Antibiotic Therapy: Anti-infectives (From admission, onward)    Start     Dose/Rate Route Frequency Ordered Stop   10/04/23 0800  cefadroxil  (DURICEF) capsule 1,000 mg        1,000 mg Oral  Once 10/03/23 1716     10/03/23 2200  linezolid  (ZYVOX ) tablet 600 mg        600 mg Oral Every 12 hours 10/03/23 1515  10/02/23 1800  vancomycin  (VANCOCIN ) IVPB 1000 mg/200 mL premix  Status:  Discontinued        1,000 mg 200 mL/hr over 60 Minutes Intravenous Every 24 hours 10/01/23 2340 10/02/23 0715   10/02/23 1000  linezolid  (ZYVOX ) IVPB 600 mg  Status:  Discontinued        600 mg 300 mL/hr over 60 Minutes Intravenous Every 12 hours 10/02/23 0715 10/03/23 1515   10/01/23 1000  vancomycin  (VANCOREADY) IVPB 1500 mg/300 mL  Status:  Discontinued        1,500 mg 150 mL/hr over 120 Minutes Intravenous Every 48 hours 09/28/23 2355 10/01/23 2340   09/29/23 2200  cefTRIAXone  (ROCEPHIN ) 1 g in sodium  chloride 0.9 % 100 mL IVPB  Status:  Discontinued        1 g 200 mL/hr over 30 Minutes Intravenous Every 24 hours 09/28/23 2340 10/02/23 0714   09/29/23 0200  vancomycin  (VANCOREADY) IVPB 2000 mg/400 mL        2,000 mg 200 mL/hr over 120 Minutes Intravenous  Once 09/28/23 2355 09/29/23 0810   09/28/23 2145  cefTRIAXone  (ROCEPHIN ) 1 g in sodium chloride  0.9 % 100 mL IVPB        1 g 200 mL/hr over 30 Minutes Intravenous  Once 09/28/23 2136 09/28/23 2228      Procedures: I&D right middle finger  Consultants: Orthopedics Infectious disease  Assessment and Plan:  * Infection due to non-invasive methicillin sensitive Staphylococcus aureus R 3rd digit purulent cellulitis.  S/p I&D on 09/29/23 by Dr. Murrell hand surgeon Transition empiric IV rocephin  and vancomycin  >> IV Linezolid  Hand surgeon follow up -- family request in [place], local for convenience Prior to transfer to [facility], case was discussed w/ Dr. Murrell- recommended extended course of IV antibiotics in setting of chronic immunosuppression.   Per ID recommendation: linezolid  600 mg PO BID until 10/13/23, followed by cefadroxil  1000 mg BID from 10/13/23 to 11/10/23 Outpatient followup with ID 11/06/23 at 3:30p  Acute kidney injury superimposed on stage 3a chronic kidney disease (HCC) Resolved  E. coli UTI (urinary tract infection) Completed abx on 10/02/23  Rheumatoid arthritis (HCC) Resumed plaquenil  on discharge  Hold methotrexate  until follow with rheumatology  Obesity, Class II, BMI 35-39.9 Body mass index is 38.15 kg/m. This complicates overall care and prognosis.    Pain control - [place]  Controlled Substance Reporting System database was reviewed. and patient was instructed, not to drive, operate heavy machinery, perform activities at heights, swimming or participation in water activities or provide baby-sitting services while on Pain, Sleep and Anxiety Medications; until their outpatient Physician has advised  to do so again. Also recommended to not to take more than prescribed Pain, Sleep and Anxiety Medications.  Consultants: ID, hand surgeon Procedures performed: ID  Disposition: Home Diet recommendation:  Cardiac diet DISCHARGE MEDICATION: Allergies as of 10/04/2023       Reactions   Bupropion Other (See Comments)   Siezures   Jardiance [empagliflozin] Nausea And Vomiting, Other (See Comments)   Severe Projectile vomiting   Penicillins Nausea And Vomiting, Rash   Statins Diarrhea, Other (See Comments)   Intolerance at higher doses and frequency.    Melatonin Other (See Comments)   Gagging, nausea and vomiting the following day       Follow-up Information     Murrell Drivers, MD. Call.   Specialty: Orthopedic Surgery Contact information: [address] [place] [address] [phone]         [facility]  transport/ wheelchair  service Follow up.   Why: Contact Us  Today to Request a Service!  When you're in need of senior or wheelchair transportation services, rely on the experienced team at [facility] transportation to safely get you from point A to point B. No matter your needs, our non-emergency transportation company is here to help. We have all the proper equipment and knowledge to work with a variety of medical needs.    For years, we've provided quality services to residents across [place], [place], [place], [place] and the surrounding communities. Call us  or fill out the contact form to learn more about our non-emergency transportation services.  [facility] [address]  [place], [place] [address]  [phone]    Click here for career opportunities with [employer].    Hours of Operation:  Monday - Saturday: 6:00 AM - 5:00 PM  Sunday Appt. Only               Discharge Exam: Filed Weights   09/29/23 0416 09/30/23 0503  Weight: 106.9 kg 104 kg   Condition at discharge: stable  The results of significant diagnostics  from this hospitalization (including imaging, microbiology, ancillary and laboratory) are listed below for reference.   Imaging Studies: US  RENAL Result Date: 09/30/2023 CLINICAL DATA:  Complicated UTI. EXAM: RENAL / URINARY TRACT ULTRASOUND COMPLETE COMPARISON:  01/31/2022. FINDINGS: Right Kidney: Renal measurements: 8.5 x 4.0 x 4.4 cm = volume: 79.5 mL. Echogenicity within normal limits. A cyst is present in the lower pole measuring 1.1 x 0.8 x 1.3 cm. No mass or hydronephrosis visualized. Left Kidney: Renal measurements: 8.4 x 4.6 x 4.4 cm = volume: 89.8 mL. Echogenicity within normal limits. A slightly complex lesion with internal echoes is present in the upper pole measuring 1.0 x 1.0 x 0.9 cm. No mass or hydronephrosis visualized. Bladder: Appears normal for degree of bladder distention. Bilateral ureteral jets are seen. Other: Limited evaluation due to patient's body habitus and limited mobility. IMPRESSION: 1. No hydronephrosis bilaterally. 2. Bilateral renal atrophy with right renal cyst. 3. Slightly complex with internal echoes lesion in the upper pole of the left kidney with indeterminate imaging characteristics. MRI is recommended for further evaluation to exclude the possibility of underlying neoplasm. Electronically Signed   By: Leita Birmingham M.D.   On: 09/30/2023 17:37   CT Head Wo Contrast Result Date: 09/28/2023 CLINICAL DATA:  Altered level of consciousness EXAM: CT HEAD WITHOUT CONTRAST TECHNIQUE: Contiguous axial images were obtained from the base of the skull through the vertex without intravenous contrast. RADIATION DOSE REDUCTION: This exam was performed according to the departmental dose-optimization program which includes automated exposure control, adjustment of the mA and/or kV according to patient size and/or use of iterative reconstruction technique. COMPARISON:  10/28/2022 FINDINGS: Brain: Confluent hypodensities throughout the periventricular white matter most consistent with  chronic small vessel ischemic changes. No evidence of acute infarct or hemorrhage. Lateral ventricles and midline structures appear unremarkable. No acute extra-axial fluid collections. No mass effect. Vascular: No hyperdense vessel or unexpected calcification. Skull: Normal. Negative for fracture or focal lesion. Sinuses/Orbits: No acute finding. Other: None. IMPRESSION: 1. No acute intracranial process. Chronic small vessel ischemic changes as above. Electronically Signed   By: Ozell Daring M.D.   On: 09/28/2023 22:46   DG Finger Middle Right Result Date: 09/28/2023 CLINICAL DATA:  Swelling and erythema EXAM: RIGHT MIDDLE FINGER 2+V COMPARISON:  None Available. FINDINGS: Frontal, oblique, and lateral views of the right third digit  are obtained. There are no acute or destructive bony abnormalities. Severe joint space narrowing and osteophyte formation of the distal interphalangeal joint compatible with osteoarthritis. There is diffuse soft tissue swelling, with no subcutaneous gas or radiopaque foreign body identified. IMPRESSION: 1. Soft tissue swelling, with no evidence of radiopaque foreign body or soft tissue gas. 2. No radiographic evidence of osteomyelitis. 3. Severe osteoarthritis. Electronically Signed   By: Ozell Daring M.D.   On: 09/28/2023 18:46    Microbiology: Results for orders placed or performed during the hospital encounter of 09/28/23  Urine Culture     Status: Abnormal   Collection Time: 09/28/23  8:55 PM   Specimen: Urine, Random  Result Value Ref Range Status   Specimen Description URINE, RANDOM  Final   Special Requests   Final    NONE Reflexed from [id number] Performed at [facility], [address], [place], [place] [address]    Culture >=100,000 COLONIES/mL ESCHERICHIA COLI (A)  Final   Report Status 09/30/2023 FINAL  Final   Organism ID, Bacteria ESCHERICHIA COLI (A)  Final      Susceptibility   Escherichia coli - MIC*    AMPICILLIN <=2 SENSITIVE Sensitive      CEFAZOLIN  (URINE) Value in next row Sensitive      2 SENSITIVEThis is a modified FDA-approved test that has been validated and its performance characteristics determined by the reporting laboratory.  This laboratory is certified under the Clinical Laboratory Improvement Amendments CLIA as qualified to perform high complexity clinical laboratory testing.    CEFEPIME  Value in next row Sensitive      2 SENSITIVEThis is a modified FDA-approved test that has been validated and its performance characteristics determined by the reporting laboratory.  This laboratory is certified under the Clinical Laboratory Improvement Amendments CLIA as qualified to perform high complexity clinical laboratory testing.    ERTAPENEM Value in next row Sensitive      2 SENSITIVEThis is a modified FDA-approved test that has been validated and its performance characteristics determined by the reporting laboratory.  This laboratory is certified under the Clinical Laboratory Improvement Amendments CLIA as qualified to perform high complexity clinical laboratory testing.    CEFTRIAXONE  Value in next row Sensitive      2 SENSITIVEThis is a modified FDA-approved test that has been validated and its performance characteristics determined by the reporting laboratory.  This laboratory is certified under the Clinical Laboratory Improvement Amendments CLIA as qualified to perform high complexity clinical laboratory testing.    CIPROFLOXACIN  Value in next row Sensitive      2 SENSITIVEThis is a modified FDA-approved test that has been validated and its performance characteristics determined by the reporting laboratory.  This laboratory is certified under the Clinical Laboratory Improvement Amendments CLIA as qualified to perform high complexity clinical laboratory testing.    GENTAMICIN Value in next row Sensitive      2 SENSITIVEThis is a modified FDA-approved test that has been validated and its performance characteristics determined by the  reporting laboratory.  This laboratory is certified under the Clinical Laboratory Improvement Amendments CLIA as qualified to perform high complexity clinical laboratory testing.    NITROFURANTOIN Value in next row Sensitive      2 SENSITIVEThis is a modified FDA-approved test that has been validated and its performance characteristics determined by the reporting laboratory.  This laboratory is certified under the Clinical Laboratory Improvement Amendments CLIA as qualified to perform high complexity clinical laboratory testing.    TRIMETH/SULFA Value  in next row Sensitive      2 SENSITIVEThis is a modified FDA-approved test that has been validated and its performance characteristics determined by the reporting laboratory.  This laboratory is certified under the Clinical Laboratory Improvement Amendments CLIA as qualified to perform high complexity clinical laboratory testing.    AMPICILLIN/SULBACTAM Value in next row Sensitive      2 SENSITIVEThis is a modified FDA-approved test that has been validated and its performance characteristics determined by the reporting laboratory.  This laboratory is certified under the Clinical Laboratory Improvement Amendments CLIA as qualified to perform high complexity clinical laboratory testing.    PIP/TAZO Value in next row Sensitive      <=4 SENSITIVEThis is a modified FDA-approved test that has been validated and its performance characteristics determined by the reporting laboratory.  This laboratory is certified under the Clinical Laboratory Improvement Amendments CLIA as qualified to perform high complexity clinical laboratory testing.    MEROPENEM Value in next row Sensitive      <=4 SENSITIVEThis is a modified FDA-approved test that has been validated and its performance characteristics determined by the reporting laboratory.  This laboratory is certified under the Clinical Laboratory Improvement Amendments CLIA as qualified to perform high complexity clinical  laboratory testing.    * >=100,000 COLONIES/mL ESCHERICHIA COLI  Fungus Culture With Stain     Status: None (Preliminary result)   Collection Time: 09/29/23  1:18 PM   Specimen: PATH Soft tissue  Result Value Ref Range Status   Fungus Stain Final report  Final    Comment: (NOTE) Performed At: [facility] [address] [place], [place] [id number] Jennette Shorter MD Ey:[id number]    Fungus (Mycology) Culture PENDING  Incomplete   Fungal Source ABSCESS  Final    Comment: Performed at [facility], [address] [place], [place] [address]  Aerobic/Anaerobic Culture w Gram Stain (surgical/deep wound)     Status: None (Preliminary result)   Collection Time: 09/29/23  1:18 PM   Specimen: PATH Soft tissue  Result Value Ref Range Status   Specimen Description ABSCESS  Final   Special Requests RIGHT LONG FINGER  Final   Gram Stain   Final    NO WBC SEEN RARE GRAM POSITIVE COCCI IN PAIRS Performed at [facility], [address] [place], [place] [address]    Culture   Final    FEW STAPHYLOCOCCUS AUREUS NO ANAEROBES ISOLATED; CULTURE IN PROGRESS FOR 5 DAYS    Report Status PENDING  Incomplete   Organism ID, Bacteria STAPHYLOCOCCUS AUREUS  Final      Susceptibility   Staphylococcus aureus - MIC*    CIPROFLOXACIN  4 RESISTANT Resistant     ERYTHROMYCIN RESISTANT Resistant     GENTAMICIN <=0.5 SENSITIVE Sensitive     OXACILLIN 0.5 SENSITIVE Sensitive     TETRACYCLINE <=1 SENSITIVE Sensitive     VANCOMYCIN  1 SENSITIVE Sensitive     TRIMETH/SULFA <=10 SENSITIVE Sensitive     CLINDAMYCIN RESISTANT Resistant     RIFAMPIN <=0.5 SENSITIVE Sensitive     Inducible Clindamycin POSITIVE Resistant     LINEZOLID  2 SENSITIVE Sensitive     * FEW STAPHYLOCOCCUS AUREUS  Fungus Culture Result     Status: None   Collection Time: 09/29/23  1:18 PM  Result Value Ref Range Status   Result 1 Comment  Final    Comment: (NOTE) KOH/Calcofluor preparation:  no fungus  observed. Performed At: [facility] [address] [place], [place] [id number]  Jennette Shorter MD Ey:[id number]    Labs: CBC: Recent Labs  Lab 09/28/23 1717 09/29/23 0250 09/30/23 0302 10/01/23 1313 10/03/23 1323  WBC 9.8 10.4 7.8 8.0 8.7  NEUTROABS 5.7 6.7  --   --   --   HGB 12.0 11.2* 10.6* 11.0* 10.8*  HCT 38.5 34.7* 33.5* 35.1* 35.9*  MCV 91.9 90.1 91.5 91.4 95.0  PLT 341 317 257 252 294   Basic Metabolic Panel: Recent Labs  Lab 09/28/23 1717 09/29/23 0250 09/30/23 1029 10/01/23 1313 10/02/23 1705  NA 133* 137 138 140 137  K 4.2 3.7 3.7 4.2 5.0  CL 93* 97* 104 105 105  CO2 27 21* 26 25 21*  GLUCOSE 116* 122* 128* 121* 123*  BUN 30* 30* 19 15 16   CREATININE 1.59* 1.42* 1.11* 0.93 0.88  CALCIUM  9.2 9.0 8.8* 8.6* 8.3*  MG 2.2 2.1 2.0  --   --   PHOS  --   --  3.2  --   --    Liver Function Tests: Recent Labs  Lab 09/28/23 1717 09/29/23 0250  AST 14* 12*  ALT 8 8  ALKPHOS 59 53  BILITOT 0.6 0.7  PROT 6.9 6.3*  ALBUMIN 3.8 3.5   CBG: Recent Labs  Lab 09/29/23 1151 09/29/23 1345 09/29/23 1548 09/29/23 2110 09/30/23 0558  GLUCAP 124* 95 93 118* 103*   Discharge time spent: greater than 30 minutes.  Signed: Dr. Sherre [facility] 10/04/2023

## 2023-10-04 NOTE — Assessment & Plan Note (Signed)
 Completed abx on 10/02/23

## 2023-10-06 ENCOUNTER — Telehealth: Payer: Self-pay | Admitting: Family Medicine

## 2023-10-06 NOTE — Telephone Encounter (Addendum)
 Received call about patient with new onset diarrhea Noted recent admission for R 3rd finger cellulitis w/ concern for tenosynovitis s/p I&D with Hand Surgery (Dr. Murrell) 09/29/23.  Admission 9/18-->9/24 (see dc summary for full details)  Currently on zyvox  for extended antibiotic treatment in setting of chronic immunosuppression associated with RA.  Daughter reports loose bowel movements.  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  Maintaining adequate fluid intake per the daughter.  No abdominal pain, fevers, chills or vomiting.  Pt also has a private caregiver at home to monitor (discussion about transitioning patient to higher level of post acute care ie ALF vs. SNF was discussed during recent admission to which daughter declined).  Confirmed with daughter the duracef is not also being given (due to start 10/3 x 4 weeks).  Discussed to monitor for any clinical decompensation.  Hold home lasix   Can consider adjunctive measures like probiotics and lomotil in setting of mild symptoms as appropriate.  Will plan for PCP follow up early next week.

## 2023-10-11 ENCOUNTER — Telehealth (INDEPENDENT_AMBULATORY_CARE_PROVIDER_SITE_OTHER): Admitting: Psychiatry

## 2023-10-11 ENCOUNTER — Telehealth: Payer: Self-pay

## 2023-10-11 ENCOUNTER — Telehealth: Admitting: Psychiatry

## 2023-10-11 ENCOUNTER — Encounter: Payer: Self-pay | Admitting: Psychiatry

## 2023-10-11 DIAGNOSIS — F5101 Primary insomnia: Secondary | ICD-10-CM | POA: Diagnosis not present

## 2023-10-11 DIAGNOSIS — F3342 Major depressive disorder, recurrent, in full remission: Secondary | ICD-10-CM

## 2023-10-11 DIAGNOSIS — F3341 Major depressive disorder, recurrent, in partial remission: Secondary | ICD-10-CM | POA: Insufficient documentation

## 2023-10-11 MED ORDER — DOXEPIN HCL 50 MG PO CAPS: 50.0000 mg | ORAL_CAPSULE | Freq: Every day | ORAL | 3 refills | Status: AC

## 2023-10-11 NOTE — Progress Notes (Signed)
 Virtual Visit via Video Note  I connected with Laura [name] on 10/12/23 at  2:00 PM EDT by a video enabled telemedicine application and verified that I am speaking with the correct person using two identifiers.  Location Provider Location : [facility] Patient Location : Home  Participants: Patient ,Spouse, daughter, Provider   I discussed the limitations of evaluation and management by telemedicine and the availability of in person appointments. The patient expressed understanding and agreed to proceed.   I discussed the assessment and treatment plan with the patient. The patient was provided an opportunity to ask questions and all were answered. The patient agreed with the plan and demonstrated an understanding of the instructions.   The patient was advised to call back or seek an in-person evaluation if the symptoms worsen or if the condition fails to improve as anticipated.  [facility] MD OP Progress Note  10/11/2023 2:39 PM Laura [name]  MRN:  [id number]  Chief Complaint:  Chief Complaint  Patient presents with   Follow-up   Depression   Insomnia   Medication Refill   Discussed the use of AI scribe software for clinical note transcription with the patient, who gave verbal consent to proceed.  History of Present Illness Laura [name] is a 78 year old Caucasian female, married, lives in [place], has a history of primary insomnia, MDD, chronic pain, lumbar spinal stenosis with neurogenic claudication, rheumatoid arthritis, history of total knee replacement left, fibromyalgia, osteoarthritis of bilateral hips, left foot drop, thyroid disease, chronic kidney disease, multiple other medical problems was evaluated by telemedicine today.  She reports that she does not consider herself a person with depression and describes feeling generally positive about her life, expressing love for her husband and children. Emotional distress arises for her related to her son not visiting during  her recent hospitalization, and she states that this bothered her and that she plans to discuss her feelings with him. She describes missing her grandchildren, whom she has not seen yet, and reflects on her past caregiving role for her son, who had learning difficulties. She identifies this family dynamic as a source of emotional discomfort. When asked directly, she denies current depression.  Regarding sleep, she describes it as adequate and confirms that she continues to take doxepin  for sleep. She initially expresses uncertainty about her current antidepressant regimen, stating she does not believe she is taking duloxetine  (Cymbalta ) anymore. However, her daughter later confirms that duloxetine  is in her pill box and that, to her knowledge, she has been taking it as prescribed.   She denies thoughts of self-harm, suicide, or harming others.  As per collateral information obtained from daughter Laura [name] ,phone number [phone], patient with recent hospitalization dated 09/28/2023 - 10/04/2023 for right middle finger infection due to noninvasive methicillin sensitive Staph aureus currently on antibiotics.  Patient however developed diarrhea due to linezolid  and is currently trying to contact infectious disease for further recommendations.'     Visit Diagnosis:    ICD-10-CM   1. Recurrent major depressive disorder, in full remission  F33.42     2. Primary insomnia  F51.01 doxepin  (SINEQUAN ) 50 MG capsule      Past Psychiatric History: I have reviewed past psychiatric history from progress note on 06/13/2018.  Past Medical History:  Past Medical History:  Diagnosis Date   Abnormal MRI, lumbar spine (04/30/2021) 02/19/2020   (04/30/2021) LUMBAR MRI FINDINGS:  Alignment:  Degenerative grade 1 anterolisthesis at L3-4 and L4-5  Conus medullaris and cauda equina: Conus extends to  the T12-L1 level. Conus appears normal. There is cauda equina redundancy due to the degree of severe spinal stenosis at L3-4.   Paraspinal and other soft tissues: Fatty atrophy of intrinsic back muscles.  DISC LEVELS:  L1-2: Mild disc narrowing and   Allergy    Anxiety    Aortic atherosclerosis    Arthritis, degenerative 10/08/2013   Asthma    Cardiomegaly    Cerebrovascular accident (CVA) (HCC) 01/02/2020   Chronic kidney disease, stage 3 (HCC)    Chronic, continuous use of opioids    a.) followed by pain management; b.) has naloxone  Rx available   Coronary artery disease involving native coronary artery of native heart without angina pectoris    a.) MV 11/08/2022: mild ischemia in apical septal wall (TID 0.94); b.) LHC 11/24/2022: minor luminal irregs LM/LAD/LCx, 50% pRCA, 25% mRCA - med mgmt   DDD (degenerative disc disease), lumbar    Depression    Diet-controlled type 2 diabetes mellitus (HCC)    Displaced fracture of right femoral neck (HCC) 02/02/2022   Essential hypertension    Fall at home, initial encounter 02/01/2022   Fracture of transverse process of lumbar vertebra, closed, initial encounter (HCC) 02/01/2022   GERD (gastroesophageal reflux disease)    Heberden's nodes    HFrEF (heart failure with reduced ejection fraction) (HCC)    a.) TTE 10/09/2018: EF 55-60%, no RWMAS, mild RVE, mild BAE, triv MR/TR, RVSP 48.7; b.) TTE 03/18/2021: EF >55%, no RWMAs, G!DD, triv AR/TR/PR, mild MR   History of bilateral cataract extraction    History of femur fracture (Right) 02/09/2015   History of TKR (total knee replacement) (Left) 02/09/2015   Hypothyroidism    Insomnia    Long term methotrexate  user    Long-term use of aspirin  therapy    Long-term use of hydroxychloroquine     Long-term use of infliximab    Lumbar spinal stenosis with neurogenic claudication 11/07/2014   Memory loss    Memory loss, short term 03/19/2014   Mixed hyperlipidemia    MRSA infection    upper lip   OSA on CPAP    Osteoarthritis    Paroxysmal atrial fibrillation (HCC)    a.) occurred in setting of PNA and hypokalemia; no  recurrence   Pneumonia    Postmenopausal 04/06/2017   Pulmonary hypertension (HCC)    a.) TTE 10/09/2018: RVSP 48.7 mmHg   Renal cyst, left    Rheumatoid arthritis (HCC)    a.) on hydryoxychloroqine + MTX + infliximab   Sciatica of left side 12/31/2019   Seizure (HCC) 10/07/2014   one time episode from wellbutrin   Statin myopathy 03/20/2020   Stool incontinence 10/2022   Umbilical hernia    Urinary incontinence    Vitamin D  deficiency     Past Surgical History:  Procedure Laterality Date   ABDOMINAL HYSTERECTOMY     CATARACT EXTRACTION W/ INTRAOCULAR LENS  IMPLANT, BILATERAL     CESAREAN SECTION     x2   DIAGNOSTIC LAPAROSCOPY     HIP FRACTURE SURGERY Right    INCISION AND DRAINAGE ABSCESS Right 09/29/2023   Procedure: INCISION AND DRAINAGE, ABSCESS;  Surgeon: Murrell Drivers, MD;  Location: [facility] OR;  Service: Orthopedics;  Laterality: Right;  incision and drainage right long finger   LEFT HEART CATH AND CORONARY ANGIOGRAPHY Left 11/24/2022   Procedure: LEFT HEART CATH AND CORONARY ANGIOGRAPHY;  Surgeon: Florencio Cara BIRCH, MD;  Location: [facility] INVASIVE CV LAB;  Service: Cardiovascular;  Laterality: Left;  ORIF PELVIC FRACTURE WITH PERCUTANEOUS SCREWS Bilateral 02/02/2022   Procedure: ORIF PELVIC FRACTURE WITH PERCUTANEOUS SCREWS;  Surgeon: Kendal Franky SQUIBB, MD;  Location: [facility] OR;  Service: Orthopedics;  Laterality: Bilateral;   TOTAL KNEE ARTHROPLASTY Left     Family Psychiatric History: I have reviewed family psychiatric history from progress note on 06/13/2018.  Family History:  Family History  Problem Relation Age of Onset   Hypertension Mother    Stroke Mother    Heart attack Father    Alcohol abuse Father    Depression Father    Post-traumatic stress disorder Father    Rheum arthritis Sister    Hypertension Sister    Depression Sister    Breast cancer Neg Hx     Social History: I have reviewed social history from progress note on 06/13/2018. Social History    Socioeconomic History   Marital status: Married    Spouse name: Laura [name]   Number of children: 2   Years of education: Not on file   Highest education level: Not on file  Occupational History    Comment: retired  Tobacco Use   Smoking status: Never   Smokeless tobacco: Never  Vaping Use   Vaping status: Never Used  Substance and Sexual Activity   Alcohol use: No    Alcohol/week: 0.0 standard drinks of alcohol   Drug use: Never   Sexual activity: Not Currently  Other Topics Concern   Not on file  Social History Narrative   Not on file   Social Drivers of Health   Financial Resource Strain: Low Risk  (10/19/2022)   Received from [facility]   Overall Financial Resource Strain (CARDIA)    Difficulty of Paying Living Expenses: Not hard at all  Food Insecurity: No Food Insecurity (09/29/2023)   Hunger Vital Sign    Worried About Running Out of Food in the Last Year: Never true    Ran Out of Food in the Last Year: Never true  Transportation Needs: No Transportation Needs (09/29/2023)   PRAPARE - Administrator, Civil Service (Medical): No    Lack of Transportation (Non-Medical): No  Physical Activity: Inactive (11/15/2016)   Exercise Vital Sign    Days of Exercise per Week: 0 days    Minutes of Exercise per Session: 0 min  Stress: No Stress Concern Present (11/15/2016)   [employer] of Occupational Health - Occupational Stress Questionnaire    Feeling of Stress : Not at all  Social Connections: Moderately Isolated (09/29/2023)   Social Connection and Isolation Panel    Frequency of Communication with Friends and Family: More than three times a week    Frequency of Social Gatherings with Friends and Family: Once a week    Attends Religious Services: Never    Database administrator or Organizations: No    Attends Banker Meetings: Never    Marital Status: Married    Allergies:  Allergies  Allergen Reactions   Bupropion  Other (See Comments)    Siezures   Jardiance [Empagliflozin] Nausea And Vomiting and Other (See Comments)    Severe Projectile vomiting   Penicillins Nausea And Vomiting and Rash   Statins Diarrhea and Other (See Comments)    Intolerance at higher doses and frequency.    Melatonin Other (See Comments)    Gagging, nausea and vomiting the following day    Metabolic Disorder Labs: Lab Results  Component Value Date   HGBA1C 6.2 (H) 09/29/2023  MPG 131.24 09/29/2023   MPG 148.46 02/02/2022   No results found for: PROLACTIN No results found for: CHOL, TRIG, HDL, CHOLHDL, VLDL, LDLCALC Lab Results  Component Value Date   TSH <0.100 (L) 09/29/2023   TSH 0.383 10/07/2018    Therapeutic Level Labs: No results found for: LITHIUM No results found for: VALPROATE No results found for: CBMZ  Current Medications: Current Outpatient Medications  Medication Sig Dispense Refill   acetaminophen  (TYLENOL ) 500 MG tablet Take 500-1,000 mg by mouth every 6 (six) hours as needed for moderate pain (pain score 4-6).     ascorbic acid  (VITAMIN C ) 1000 MG tablet Take 1 tablet (1,000 mg total) by mouth daily. 30 tablet 0   aspirin  EC 81 MG tablet Take 81 mg by mouth daily. Swallow whole.     [START ON 10/13/2023] cefadroxil  (DURICEF) 500 MG capsule Take 2 capsules (1,000 mg total) by mouth 2 (two) times daily for 28 days. 112 capsule 0   doxepin  (SINEQUAN ) 50 MG capsule Take 1 capsule (50 mg total) by mouth at bedtime. 90 capsule 3   DULoxetine  (CYMBALTA ) 60 MG capsule Take 1 capsule (60 mg total) by mouth daily. Stop 20 mg , dose change 90 capsule 3   famotidine  (PEPCID ) 20 MG tablet Take 20 mg by mouth daily.     folic acid  (FOLVITE ) 1 MG tablet Take 1 mg by mouth daily.     furosemide  (LASIX ) 80 MG tablet Take 80 mg by mouth every Monday, Wednesday, and Friday.     hydroxychloroquine  (PLAQUENIL ) 200 MG tablet Take 200 mg by mouth daily.     [Paused] inFLIXimab (REMICADE) 100 MG  injection Inject 100 mg into the vein every 8 (eight) weeks.     levothyroxine  (SYNTHROID ) 125 MCG tablet Take 250 mcg by mouth daily before breakfast.     linezolid  (ZYVOX ) 600 MG tablet Take 1 tablet (600 mg total) by mouth every 12 (twelve) hours for 9 days. 18 tablet 0   loratadine  (CLARITIN ) 10 MG tablet Take 10 mg by mouth daily.      [Paused] methotrexate  (RHEUMATREX) 2.5 MG tablet Take 15 mg by mouth every Sunday.  5   metoprolol  succinate (TOPROL -XL) 100 MG 24 hr tablet Take 1 tablet (100 mg total) by mouth daily. Take with or immediately following a meal. 30 tablet 0   naloxone  (NARCAN ) nasal spray 4 mg/0.1 mL Place 1 spray into the nose as needed for up to 365 doses (for opioid-induced respiratory depresssion). In case of emergency (overdose), spray once into each nostril. If no response within 3 minutes, repeat application and call 911. 1 each 0   oxyCODONE  (OXY IR/ROXICODONE ) 5 MG immediate release tablet Take 1 tablet (5 mg total) by mouth every 6 (six) hours as needed for up to 2 days for severe pain (pain score 7-10) or breakthrough pain. Must last 30 days 8 tablet 0   oxyCODONE  (OXY IR/ROXICODONE ) 5 MG immediate release tablet Take 1 tablet (5 mg total) by mouth every 6 (six) hours as needed for up to 3 days for severe pain (pain score 7-10) or breakthrough pain. 12 tablet 0   potassium chloride  (KLOR-CON ) 10 MEQ tablet Take 10 mEq by mouth every Monday, Wednesday, and Friday.     rosuvastatin  (CRESTOR ) 5 MG tablet Take 5 mg by mouth 2 (two) times a week. (Sunday and Friday)     No current facility-administered medications for this visit.     Musculoskeletal: Strength & Muscle Tone: UTA Gait &  Station: Seated Patient leans: N/A  Psychiatric Specialty Exam: Review of Systems  Psychiatric/Behavioral: Negative.      There were no vitals taken for this visit.There is no height or weight on file to calculate BMI.  General Appearance: Casual  Eye Contact:  Fair  Speech:  Clear  and Coherent  Volume:  Normal  Mood:  Euthymic  Affect:  Appropriate  Thought Process:  Goal Directed and Descriptions of Associations: Intact  Orientation:  Full (Time, Place, and Person)  Thought Content: Logical   Suicidal Thoughts:  No  Homicidal Thoughts:  No  Memory:  Immediate;   Fair Recent;   Fair Remote;   Poor  Judgement:  Fair  Insight:  Fair  Psychomotor Activity:  Normal  Concentration:  Concentration: Fair and Attention Span: Fair  Recall:  [employer] of Knowledge: Fair  Language: Fair  Akathisia:  No  Handed:  Right  AIMS (if indicated): not done  Assets:  Communication Skills Desire for Improvement Housing Social Support  ADL's:  Intact  Cognition: WNL  Sleep:  Fair   Screenings: AIMS    Flowsheet Row [facility] Visit from 04/28/2020 in [facility]  AIMS Total Score 0   GAD-7    Flowsheet Row Office Visit from 01/09/2023 in [facility]  Total GAD-7 Score 10   PHQ2-9    Flowsheet Row Office Visit from 01/09/2023 in [facility] Most recent reading at 01/09/2023  1:01 PM Office Visit from 01/09/2023 in [facility] Most recent reading at 01/09/2023 11:36 AM Office Visit from 07/20/2022 in [facility] Most recent reading at 07/20/2022  2:09 PM Office Visit from 04/04/2022 in [facility] Most recent reading at 04/04/2022  1:06 PM Procedure visit from 10/21/2021 in [facility] Most recent reading at 10/21/2021 11:54 AM  PHQ-2 Total Score 1 3 0 0 0  PHQ-9 Total Score -- 8 -- -- --   Flowsheet Row Video Visit from 10/11/2023 in [facility] ED to  Hosp-Admission (Discharged) from 09/28/2023 in [facility] Video Visit from 06/01/2023 in [facility]  C-SSRS RISK CATEGORY No Risk No Risk Low Risk     Assessment and Plan: Laura [name] is a 78 year old Caucasian female who has a history of depression, insomnia, multiple medical problems was evaluated by telemedicine today.  Discussed assessment and plan as noted below.  1. Recurrent major depressive disorder, in full remission Currently denies any significant mood symptoms. Continue Cymbalta  60 mg daily   2. Primary insomnia-stable Denies any significant sleep problems. Continue Doxepin  50 mg at bedtime Continue sleep hygiene techniques.  Follow-up Follow-up in clinic in 3 months or sooner in person.  Collaboration of Care: Collaboration of Care: Other labs information obtained from medical records prior hospitalist admission dated 9/18 to 10/04/2023-as noted above.  Collateral information also obtained from daughter as noted above.  Patient/Guardian was advised Release of Information must be obtained prior to any record release in order to collaborate their care with an outside provider. Patient/Guardian was advised if they have not already done so to contact the registration department to sign all necessary forms in order for us  to release information regarding their care.   Consent: Patient/Guardian gives verbal consent for treatment  and assignment of benefits for services provided during this visit. Patient/Guardian expressed understanding and agreed to proceed.   This note was generated in part or whole with voice recognition software. Voice recognition is usually quite accurate but there are transcription errors that can and very often do occur. I apologize for any typographical errors that were not detected and corrected.    Laura Dudgeon, MD 10/12/2023, 3:06 PM

## 2023-10-11 NOTE — Telephone Encounter (Signed)
 Patient's husband informed patient can stop linezolid  and patient can start the cefadroxil .

## 2023-10-11 NOTE — Telephone Encounter (Signed)
 I think Dr. Chapman plan was to push linezolid  through 10/3 which is only 2 days away. Dr. Overton - would you like her to go ahead and switch to cefadroxil  and see how she does? Your note mentions alternative of two dalbavancin doses after that. Of course, always encourage taking antibiotics with food to help.

## 2023-10-11 NOTE — Telephone Encounter (Signed)
 Patient can switch to cefadroxil  early now and stop the linezolid  to see if that helps with symptoms!

## 2023-10-11 NOTE — Telephone Encounter (Signed)
 Yeah we can switch early  Thanks

## 2023-10-11 NOTE — Telephone Encounter (Signed)
 Patient's husband called stating the patient has been having a lot of diarrhea with taking the linezolid . The patient has not been able to control her bowels. Patient husband did not give the patient the linezolid  today.  Please advise.  Laura [name], Laura [name]

## 2023-10-16 ENCOUNTER — Telehealth: Payer: Self-pay | Admitting: Internal Medicine

## 2023-10-16 NOTE — Telephone Encounter (Signed)
 Anaclara's daughter, Vertell, called to schedule a next day virtual visit. She stated she spoke with Dr. Overton who suggested to get in today. Pt is having uncontrollable diarrhea with limited to no mobility. Jameelah would need afternoon based on aide's availability. Next available being next week. Please advise. Vertell can be reached at [phone].

## 2023-10-18 NOTE — Telephone Encounter (Signed)
 Spoke with patient's daughter Vertell, she states that patient has not been having bowel incontinence since stopping the antibiotics. Scheduled for virtual visit tomorrow to discuss next steps. They have not gone for c.diff testing since symptoms have resolved.   Loys Shugars, BSN, RN

## 2023-10-19 ENCOUNTER — Telehealth: Payer: Self-pay | Admitting: Internal Medicine

## 2023-10-19 ENCOUNTER — Telehealth: Admitting: Internal Medicine

## 2023-10-19 DIAGNOSIS — M869 Osteomyelitis, unspecified: Secondary | ICD-10-CM

## 2023-10-19 DIAGNOSIS — R197 Diarrhea, unspecified: Secondary | ICD-10-CM

## 2023-10-19 MED ORDER — DOXYCYCLINE HYCLATE 100 MG PO TABS: 100.0000 mg | ORAL_TABLET | Freq: Two times a day (BID) | ORAL | 0 refills | Status: AC

## 2023-10-19 NOTE — Telephone Encounter (Signed)
 Patient has appointment scheduled 10/27.  Dyon Rotert, BSN, RN

## 2023-10-20 NOTE — Telephone Encounter (Signed)
 Thanks for the update

## 2023-10-26 ENCOUNTER — Ambulatory Visit: Attending: Nurse Practitioner | Admitting: Nurse Practitioner

## 2023-10-26 ENCOUNTER — Encounter: Payer: Self-pay | Admitting: Nurse Practitioner

## 2023-10-26 VITALS — BP 151/106 | HR 71 | Temp 97.4°F | Resp 18 | Ht 65.0 in | Wt 229.0 lb

## 2023-10-26 DIAGNOSIS — G8929 Other chronic pain: Secondary | ICD-10-CM | POA: Insufficient documentation

## 2023-10-26 DIAGNOSIS — Z79899 Other long term (current) drug therapy: Secondary | ICD-10-CM | POA: Insufficient documentation

## 2023-10-26 DIAGNOSIS — M5442 Lumbago with sciatica, left side: Secondary | ICD-10-CM | POA: Insufficient documentation

## 2023-10-26 DIAGNOSIS — G894 Chronic pain syndrome: Secondary | ICD-10-CM | POA: Diagnosis not present

## 2023-10-26 DIAGNOSIS — M4316 Spondylolisthesis, lumbar region: Secondary | ICD-10-CM | POA: Diagnosis present

## 2023-10-26 DIAGNOSIS — M79605 Pain in left leg: Secondary | ICD-10-CM | POA: Diagnosis present

## 2023-10-26 DIAGNOSIS — M79604 Pain in right leg: Secondary | ICD-10-CM | POA: Insufficient documentation

## 2023-10-26 DIAGNOSIS — M5441 Lumbago with sciatica, right side: Secondary | ICD-10-CM | POA: Insufficient documentation

## 2023-10-26 MED ORDER — OXYCODONE HCL 5 MG PO TABS: 5.0000 mg | ORAL_TABLET | Freq: Four times a day (QID) | ORAL | 0 refills | Status: DC | PRN

## 2023-10-26 NOTE — Progress Notes (Signed)
 Nursing Pain Medication Assessment:  Safety precautions to be maintained throughout the outpatient stay will include: orient to surroundings, keep bed in low position, maintain call bell within reach at all times, provide assistance with transfer out of bed and ambulation.  Medication Inspection Compliance: Pill count conducted under aseptic conditions, in front of the patient. Neither the pills nor the bottle was removed from the patient's sight at any time. Once count was completed pills were immediately returned to the patient in their original bottle.  Medication: Oxycodone  IR Pill/Patch Count: 65 of 120 pills/patches remain Pill/Patch Appearance: Markings consistent with prescribed medication Bottle Appearance: Standard pharmacy container. Clearly labeled. Filled Date: 09 / 06 / 2025 Last Medication intake:  Yesterday

## 2023-10-26 NOTE — Progress Notes (Signed)
 PROVIDER NOTE: Interpretation of information contained herein should be left to medically-trained personnel. Specific patient instructions are provided elsewhere under Patient Instructions section of medical record. This document was created in part using AI and STT-dictation technology, any transcriptional errors that may result from this process are unintentional.  Patient: Laura [name]  Service: E/M   PCP: [name], MD  DOB: Apr 27, 1945  DOS: 10/26/2023  Provider: Emmy MARLA Blanch, NP  MRN: [id number]  Delivery: Face-to-face  Specialty: Interventional Pain Management  Type: Established Patient  Setting: Ambulatory outpatient facility  Specialty designation: 09  Referring Prov.: [name], MD  Location: Outpatient office facility       History of present illness (HPI) [name], a 78 y.o. year old female, is here today because of her Chronic pain syndrome [G89.4]. [name] primary complain today is Back Pain (Radiates down Bilateral legs)  Pertinent problems: 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  Pain Assessment: Severity of Chronic pain is reported as a 6 /10. Location: Back Lower/Bilateral Legs. Onset: More than a month ago. Quality: Constant. Timing: Constant. Modifying factor(s): Oxycodone . Vitals:  height is 5' 5 (1.651 m) and weight is 229 lb (103.9 kg). Her temporal temperature is 97.4 F (36.3 C) (abnormal). Her blood pressure is 151/106 (abnormal) and her pulse is 71. Her respiration is 18 and oxygen saturation is 94%.  BMI: Estimated body mass index is 38.11 kg/m as calculated from the following:   Height as of this encounter: 5' 5 (1.651 m).   Weight as of this encounter: 229 lb (103.9 kg).  Last encounter: 09/28/2023. Last procedure: Visit date not found.  Reason for  encounter: medication management. The patient indicates doing well with current medication regimen. No side effects or adverse reaction reported to medication.  The patient presents with back pain she accompanied by her her daughter, who is primary caregiver.  She experiences persistent back pain, which she attributes to her rheumatoid arthritis.  The pain radiates to her legs, feet, and hands, with noticeable deformities in her knuckles.  She described her hands as having knobs and notes that her knuckles have recently worsened.  She is currently taking oxycodone  every 6 hours for pain management.  BC used last night due to severe pain.  No side effects such as constipation or drowsiness.  Previously, she experienced constipation, but this is no longer an issue. Pharmacotherapy Assessment   Analgesic: Oxycodone  HCl (IR) 5 mg tablet every 6 hours as needed for severe pain.SABRA MME=30 Monitoring: [place] PMP: PDMP reviewed during this encounter.       Pharmacotherapy: No side-effects or adverse reactions reported. Compliance: No problems identified. Effectiveness: Clinically acceptable.  Erlene Doyal [place], [place]  10/26/2023  3:13 PM  Sign when Signing Visit Nursing Pain Medication Assessment:  Safety precautions to be maintained throughout the outpatient stay will include: orient to surroundings, keep bed in low position, maintain call bell within reach at all times, provide assistance with transfer out of bed and ambulation.  Medication Inspection Compliance: Pill count conducted under aseptic conditions, in front of the patient. Neither the pills nor the bottle was removed from the patient's sight at any time. Once count was completed pills were immediately returned to the patient in their original bottle.  Medication: Oxycodone  IR Pill/Patch Count: 65 of 120 pills/patches remain Pill/Patch Appearance: Markings  consistent with prescribed medication Bottle Appearance: Standard pharmacy container. Clearly  labeled. Filled Date: 09 / 06 / 2025 Last Medication intake:  Yesterday    UDS:  Summary  Date Value Ref Range Status  06/22/2021 Note  Final    Comment:    ==================================================================== [facility] (MW) ==================================================================== Test                             Result       Flag       Units  Drug Present and Declared for Prescription Verification   Oxycodone                       1634         EXPECTED   ng/mg creat   Oxymorphone                    770          EXPECTED   ng/mg creat   Noroxycodone                   2933         EXPECTED   ng/mg creat   Noroxymorphone                 191          EXPECTED   ng/mg creat    Sources of oxycodone  are scheduled prescription medications.    Oxymorphone, noroxycodone, and noroxymorphone are expected    metabolites of oxycodone . Oxymorphone is also available as a    scheduled prescription medication.  ==================================================================== Test                      Result    Flag   Units      Ref Range   Creatinine              94               mg/dL      >=79 ==================================================================== Declared Medications:  The flagging and interpretation on this report are based on the  following declared medications.  Unexpected results may arise from  inaccuracies in the declared medications.   **Note: The testing scope of this panel includes these medications:   Oxycodone  (OxyIR)   **Note: The testing scope of this panel does not include the  following reported medications:   Albuterol  (Ventolin  HFA)  Aspirin   Azelastine (Astelin)  Duloxetine  (Cymbalta )  Folic Acid   Furosemide  (Lasix )  Hydroxychloroquine  (Plaquenil )  Hydroxyzine  (Vistaril )  Infliximab (Remicade)  Levothyroxine  (Synthroid )  Loratadine  (Claritin )  Methotrexate   Metoprolol  (Toprol )  Multivitamin  Ondansetron   (Zofran )  Rosuvastatin  (Crestor )  Spironolactone  (Aldactone )  Trazodone  (Desyrel )  Vitamin D  ==================================================================== For clinical consultation, please call [phone]. ====================================================================     No results found for: CBDTHCR No results found for: D8THCCBX No results found for: D9THCCBX  ROS  Constitutional: Denies any fever or chills Gastrointestinal: No reported hemesis, hematochezia, vomiting, or acute GI distress Musculoskeletal: Low back pain radiating to bilateral legs. Neurological: No reported episodes of acute onset apraxia, aphasia, dysarthria, agnosia, amnesia, paralysis, loss of coordination, or loss of consciousness  Medication Review  DULoxetine , acetaminophen , ascorbic acid , aspirin  EC, cefadroxil , doxepin , doxycycline , famotidine , folic acid , furosemide , hydroxychloroquine , inFLIXimab, levothyroxine , loratadine , methotrexate , metoprolol  succinate, oxyCODONE , potassium chloride , and rosuvastatin   History Review  Allergy: [name] is allergic to bupropion,  jardiance [empagliflozin], penicillins, statins, and melatonin. Drug: [name]  reports no history of drug use. Alcohol:  reports no history of alcohol use. Tobacco:  reports that she has never smoked. She has never used smokeless tobacco. Social: [name]  reports that she has never smoked. She has never used smokeless tobacco. She reports that she does not drink alcohol and does not use drugs. Medical:  has a past medical history of Abnormal MRI, lumbar spine (04/30/2021) (02/19/2020), Allergy, Anxiety, Aortic atherosclerosis, Arthritis, degenerative (10/08/2013), Asthma, Cardiomegaly, Cerebrovascular accident (CVA) (HCC) (01/02/2020), Chronic kidney disease, stage 3 (HCC), Chronic, continuous use of opioids, Coronary artery disease involving native coronary artery of native heart without angina pectoris, DDD  (degenerative disc disease), lumbar, Depression, Diet-controlled type 2 diabetes mellitus (HCC), Displaced fracture of right femoral neck (HCC) (02/02/2022), Essential hypertension, Fall at home, initial encounter (02/01/2022), Fracture of transverse process of lumbar vertebra, closed, initial encounter (HCC) (02/01/2022), GERD (gastroesophageal reflux disease), Heberden's nodes, HFrEF (heart failure with reduced ejection fraction) (HCC), History of bilateral cataract extraction, History of femur fracture (Right) (02/09/2015), 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), Hypothyroidism, Insomnia, Long term methotrexate  user, Long-term use of aspirin  therapy, Long-term use of hydroxychloroquine , Long-term use of infliximab, 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 Surgical: [name]  has a past surgical history that includes Abdominal hysterectomy; Cesarean section; Total knee arthroplasty (Left); Hip fracture surgery (Right); Orif pelvic fracture with percutaneous screws (Bilateral, 02/02/2022); Diagnostic laparoscopy; Cataract extraction w/ intraocular lens  implant, bilateral; LEFT HEART CATH AND CORONARY ANGIOGRAPHY (Left, 11/24/2022); and Incision and drainage abscess (Right, 09/29/2023). Family: family history includes Alcohol abuse in her father; Depression in her father and sister; Heart attack in her father; Hypertension in her mother and sister; Post-traumatic stress disorder in her father; Rheum arthritis in  her sister; Stroke in her mother.  Laboratory Chemistry Profile   Renal Lab Results  Component Value Date   BUN 16 10/02/2023   CREATININE 0.88 10/02/2023   LABCREA 47 09/29/2023   GFRAA 57 (L) 10/03/2019   GFRNONAA >60 10/02/2023    Hepatic Lab Results  Component Value Date   AST 12 (L) 09/29/2023   ALT 8 09/29/2023   ALBUMIN 3.5 09/29/2023   ALKPHOS 53 09/29/2023   LIPASE 22 01/14/2021    Electrolytes Lab Results  Component Value Date   NA 137 10/02/2023   K 5.0 10/02/2023   CL 105 10/02/2023   CALCIUM  8.3 (L) 10/02/2023   MG 2.0 09/30/2023   PHOS 3.2 09/30/2023    Bone Lab Results  Component Value Date   VD25OH See Scanned report in [place] Link 02/03/2022   25OHVITD1 36 07/15/2015   25OHVITD2 3.6 07/15/2015   25OHVITD3 32 07/15/2015    Inflammation (CRP: Acute Phase) (ESR: Chronic Phase) Lab Results  Component Value Date   CRP 1.4 (H) 09/29/2023   ESRSEDRATE 28 (H) 09/29/2023   LATICACIDVEN 1.0 09/28/2023         Note: Above Lab results reviewed.  Recent Imaging Review  US  RENAL CLINICAL DATA:  Complicated UTI.  EXAM: RENAL / URINARY TRACT ULTRASOUND COMPLETE  COMPARISON:  01/31/2022.  FINDINGS: Right Kidney:  Renal measurements: 8.5  x 4.0 x 4.4 cm = volume: 79.5 mL. Echogenicity within normal limits. A cyst is present in the lower pole measuring 1.1 x 0.8 x 1.3 cm. No mass or hydronephrosis visualized.  Left Kidney:  Renal measurements: 8.4 x 4.6 x 4.4 cm = volume: 89.8 mL. Echogenicity within normal limits. A slightly complex lesion with internal echoes is present in the upper pole measuring 1.0 x 1.0 x 0.9 cm. No mass or hydronephrosis visualized.  Bladder:  Appears normal for degree of bladder distention. Bilateral ureteral jets are seen.  Other:  Limited evaluation due to patient's body habitus and limited mobility.  IMPRESSION: 1. No hydronephrosis bilaterally. 2. Bilateral renal atrophy with right renal cyst. 3.  Slightly complex with internal echoes lesion in the upper pole of the left kidney with indeterminate imaging characteristics. MRI is recommended for further evaluation to exclude the possibility of underlying neoplasm.  Electronically Signed   By: Leita Birmingham M.D.   On: 09/30/2023 17:37 Note: Reviewed        Physical Exam  Vitals: BP (!) 151/106 (BP Location: Right Arm, Patient Position: Sitting)   Pulse 71   Temp (!) 97.4 F (36.3 C) (Temporal)   Resp 18   Ht 5' 5 (1.651 m)   Wt 229 lb (103.9 kg)   SpO2 94%   BMI 38.11 kg/m  BMI: Estimated body mass index is 38.11 kg/m as calculated from the following:   Height as of this encounter: 5' 5 (1.651 m).   Weight as of this encounter: 229 lb (103.9 kg). Ideal: Ideal body weight: 57 kg (125 lb 10.6 oz) Adjusted ideal body weight: 75.8 kg (167 lb) General appearance: Well nourished, well developed, and well hydrated. In no apparent acute distress Mental status: Alert, oriented x 3 (person, place, & time)       Respiratory: No evidence of acute respiratory distress Eyes: PERLA  Musculoskeletal: +LBP Assessment   Diagnosis Status  1. Chronic pain syndrome   2. Chronic low back pain (1ry area of Pain) (Bilateral) (L>R) w/ sciatica (Bilateral)   3. Chronic lower extremity pain (2ry area of Pain) (Bilateral)   4. Lumbar Grade 1 Anterolisthesis of L3/L4 and L4/L5   5. Encounter for medication management    Controlled Controlled Controlled   Updated Problems: No problems updated.  Plan of Care  Problem-specific:  Assessment and Plan    Rheumatoid arthritis Severe symptoms in hands, legs, and feet with knuckle deformities and deviation. No new medication side effects.  Chronic back pain due to spinal stenosis Chronic back pain. Oxycodone  used every six hours. Post-medication tiredness noted. Spinal stenosis may affect bowel function. - Send oxycodone  prescription to [facility] in [place].  Patient's pain is  well-controlled with oxycodone , will continue on current medication regimen.  Prescribing drug monitoring (PMP reviewed; findings consistent with the use of prescribed medication and no evidence of narcotic misuse or abuse.  Urine drug screening (UDS) up-to-date.  Schedule follow-up in 90 days for medication management.     [name] has a current medication list which includes the following long-term medication(s): doxepin , duloxetine , [Paused] infliximab, loratadine , metoprolol  succinate, oxycodone , [START ON 11/25/2023] oxycodone , and [START ON 12/25/2023] oxycodone .  Pharmacotherapy (Medications Ordered): Meds ordered this encounter  Medications   oxyCODONE  (OXY IR/ROXICODONE ) 5 MG immediate release tablet    Sig: Take 1 tablet (5 mg total) by mouth every 6 (six) hours as needed for severe pain (pain score 7-10). Must last 30 days.    Dispense:  120 tablet    Refill:  0    Chronic Pain: STOP Act (Not applicable) Fill 1 day early if closed on refill date. Avoid benzodiazepines within 8 hours of opioids   oxyCODONE  (OXY IR/ROXICODONE ) 5 MG immediate release tablet    Sig: Take 1 tablet (5 mg total) by mouth every 6 (six) hours as needed for severe pain (pain score 7-10). Must last 30 days.    Dispense:  120 tablet    Refill:  0    Chronic Pain: STOP Act (Not applicable) Fill 1 day early if closed on refill date. Avoid benzodiazepines within 8 hours of opioids   oxyCODONE  (OXY IR/ROXICODONE ) 5 MG immediate release tablet    Sig: Take 1 tablet (5 mg total) by mouth every 6 (six) hours as needed for severe pain (pain score 7-10). Must last 30 days.    Dispense:  120 tablet    Refill:  0    Chronic Pain: STOP Act (Not applicable) Fill 1 day early if closed on refill date. Avoid benzodiazepines within 8 hours of opioids   Orders:  No orders of the defined types were placed in this encounter.     Return in about 3 months (around 01/26/2024) for (F2F), (MM), Emmy Blanch NP.     Recent Visits No visits were found meeting these conditions. Showing recent visits within past 90 days and meeting all other requirements Today's Visits Date Type Provider Dept  10/26/23 Office Visit Shalla Bulluck K, NP [facility]  Showing today's visits and meeting all other requirements Future Appointments No visits were found meeting these conditions. Showing future appointments within next 90 days and meeting all other requirements  I discussed the assessment and treatment plan with the patient. The patient was provided an opportunity to ask questions and all were answered. The patient agreed with the plan and demonstrated an understanding of the instructions.  Patient advised to call back or seek an in-person evaluation if the symptoms or condition worsens.  I personally spent a total of 30 minutes in the care of the patient today including preparing to see the patient, getting/reviewing separately obtained history, performing a medically appropriate exam/evaluation, counseling and educating, placing orders, referring and communicating with other health care professionals, documenting clinical information in the EHR, independently interpreting results, communicating results, and coordinating care.   Note by: Darrielle Pflieger K Chaya Dehaan, NP (TTS and AI technology used. I apologize for any typographical errors that were not detected and corrected.) Date: 10/26/2023; Time: 3:54 PM

## 2023-10-30 LAB — FUNGUS CULTURE WITH STAIN

## 2023-10-30 LAB — FUNGUS CULTURE RESULT

## 2023-10-30 LAB — FUNGAL ORGANISM REFLEX

## 2023-11-06 ENCOUNTER — Ambulatory Visit: Payer: Self-pay | Admitting: Internal Medicine

## 2023-11-06 ENCOUNTER — Other Ambulatory Visit: Payer: Self-pay

## 2023-11-06 ENCOUNTER — Encounter: Payer: Self-pay | Admitting: Internal Medicine

## 2023-11-06 VITALS — BP 128/65 | HR 66 | Temp 98.9°F

## 2023-11-06 DIAGNOSIS — Z880 Allergy status to penicillin: Secondary | ICD-10-CM | POA: Diagnosis not present

## 2023-11-06 DIAGNOSIS — M86141 Other acute osteomyelitis, right hand: Secondary | ICD-10-CM | POA: Diagnosis present

## 2023-11-06 DIAGNOSIS — D849 Immunodeficiency, unspecified: Secondary | ICD-10-CM

## 2023-11-06 DIAGNOSIS — M069 Rheumatoid arthritis, unspecified: Secondary | ICD-10-CM | POA: Diagnosis not present

## 2023-11-06 DIAGNOSIS — M00841 Arthritis due to other bacteria, right hand: Secondary | ICD-10-CM | POA: Diagnosis not present

## 2023-11-06 DIAGNOSIS — M869 Osteomyelitis, unspecified: Secondary | ICD-10-CM

## 2023-11-06 NOTE — Patient Instructions (Signed)
 For doxycycline : Avoid within 2 hours before/after taking it, dairy (milk, cheese, yogurt) or cations (calcium , magnesium, iron, multivitamin) Stay upright half an hour after you take the medication and use a full glass of water to wash it down to prevent stuck in esophagus and cause esophagitis Wear sun screen to reduce risk hypersensitivity    Continue doxycycline  for 4 more weeks at least   Labs today   See me in around 3-4 weeks

## 2023-11-06 NOTE — Progress Notes (Addendum)
 [facility]  Patient Active Problem List   Diagnosis Date Noted   Recurrent major depressive disorder, in partial remission 10/11/2023   Abscess of right middle finger 10/03/2023   Acute metabolic encephalopathy 09/29/2023   Acute kidney injury superimposed on stage 3a chronic kidney disease (HCC) 09/29/2023   Infection due to non-invasive methicillin sensitive Staphylococcus aureus 09/28/2023   History of difficult venous access 04/12/2023   Neurogenic incontinence 01/09/2023   E. coli UTI (urinary tract infection) 10/29/2022   Rheumatoid arthritis (HCC) 10/29/2022   Stool incontinence 10/28/2022   Mild cognitive impairment 06/03/2022   Heberden's nodes 06/03/2022   Ambulatory dysfunction 02/01/2022   Lymphedema 04/26/2021   Lumbar lateral recess stenosis (Bilateral) (L2-3, L3-4, L4-5) (Severe: L3-4) 05/28/2020   DDD (degenerative disc disease), lumbosacral 03/10/2020   Chronic lower extremity pain (2ry area of Pain) (Bilateral) 02/04/2020   Sciatica (Left) 12/31/2019   Dropfoot (Left) 11/20/2019   Weakness of leg (Left) 11/20/2019   Chronic hand pain (Left) 10/10/2019   Paroxysmal atrial fibrillation (HCC) 10/16/2018   Coronary artery disease involving native coronary artery of native heart without angina pectoris 10/15/2018   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   Immunosuppression - Plaquenil  09/18/2018   Primary insomnia 07/09/2018   Fibromyalgia syndrome 07/11/2016   Snoring 07/11/2016   Opioid-induced constipation (OIC) 04/21/2016   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) 02/09/2015   Lumbar foraminal stenosis (Bilateral L3-4 and L5-S1) 02/09/2015   Lumbar spinal stenosis (5 mm Severe L3-4; 8 mm L4-5) w/ neurogenic claudication 11/07/2014   Chronic bilateral low back pain with bilateral sciatica (L>R) 11/07/2014   Long term current use of opiate analgesic 11/05/2014   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)  11/05/2014   Obesity, Class II, BMI 35-39.9 10/27/2014   Type 2 diabetes mellitus (HCC) 03/19/2014   Essential (primary) hypertension 10/14/2013   MDD (major depressive disorder), recurrent episode, mild 10/14/2013   Insomnia secondary to chronic pain 07/09/2013   GAD (generalized anxiety disorder) 07/09/2013   GERD (gastroesophageal reflux disease) 01/18/2013   Acquired hypothyroidism 01/18/2013   HLD (hyperlipidemia) 01/18/2013   Seronegative rheumatoid arthritis (HCC) 01/18/2013   CKD stage 3a, GFR 45-59 ml/min (HCC) 01/17/2013   Congestive heart failure with left ventricular systolic dysfunction (HCC) 11/10/2012      Subjective:    Patient ID: [name], female    DOB: 08-19-1945, 78 y.o.   MRN: [id number]  Chief Complaint  Patient presents with   Follow-up    Right middle finger swelling and redness    HPI:  Renay Crammer is a 78 y.o. 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    Melatonin Other (See Comments)    Gagging, nausea and vomiting the following day      Outpatient Medications Prior to Visit  Medication Sig Dispense Refill   acetaminophen  (TYLENOL ) 500 MG  tablet Take 500-1,000 mg by mouth every 6 (six) hours as needed for moderate pain (pain score 4-6).     aspirin  EC 81 MG tablet Take 81 mg by mouth daily. Swallow whole.     doxepin  (SINEQUAN ) 50 MG  capsule Take 1 capsule (50 mg total) by mouth at bedtime. 90 capsule 3   doxycycline  (VIBRA -TABS) 100 MG tablet Take 1 tablet (100 mg total) by mouth 2 (two) times daily. 60 tablet 0   DULoxetine  (CYMBALTA ) 60 MG capsule Take 1 capsule (60 mg total) by mouth daily. Stop 20 mg , dose change 90 capsule 3   folic acid  (FOLVITE ) 1 MG tablet Take 1 mg by mouth daily.     furosemide  (LASIX ) 80 MG tablet Take 80 mg by mouth every Monday, Wednesday, and Friday.     hydroxychloroquine  (PLAQUENIL ) 200 MG tablet Take 200 mg by mouth daily.     levothyroxine  (SYNTHROID ) 125 MCG tablet Take 250 mcg by mouth daily before breakfast.     loratadine  (CLARITIN ) 10 MG tablet Take 10 mg by mouth daily.      metoprolol  succinate (TOPROL -XL) 100 MG 24 hr tablet Take 1 tablet (100 mg total) by mouth daily. Take with or immediately following a meal. 30 tablet 0   OMEPRAZOLE PO Take by mouth.     oxyCODONE  (OXY IR/ROXICODONE ) 5 MG immediate release tablet Take 1 tablet (5 mg total) by mouth every 6 (six) hours as needed for severe pain (pain score 7-10). Must last 30 days. 120 tablet 0   potassium chloride  (KLOR-CON ) 10 MEQ tablet Take 10 mEq by mouth every Monday, Wednesday, and Friday.     rosuvastatin  (CRESTOR ) 5 MG tablet Take 5 mg by mouth 2 (two) times a week. (Sunday and Friday)     cefadroxil  (DURICEF) 500 MG capsule Take 2 capsules (1,000 mg total) by mouth 2 (two) times daily for 28 days. (Patient not taking: Reported on 11/06/2023) 112 capsule 0   famotidine  (PEPCID ) 20 MG tablet Take 20 mg by mouth daily. (Patient not taking: Reported on 11/06/2023)     inFLIXimab (REMICADE) 100 MG injection Inject 100 mg into the vein every 8 (eight) weeks.     methotrexate  (RHEUMATREX) 2.5 MG tablet Take 15 mg by mouth every Sunday.  5   [START ON 11/25/2023] oxyCODONE  (OXY IR/ROXICODONE ) 5 MG immediate release tablet Take 1 tablet (5 mg total) by mouth every 6 (six) hours as needed for severe pain (pain score 7-10). Must last  30 days. 120 tablet 0   [START ON 12/25/2023] oxyCODONE  (OXY IR/ROXICODONE ) 5 MG immediate release tablet Take 1 tablet (5 mg total) by mouth every 6 (six) hours as needed for severe pain (pain score 7-10). Must last 30 days. 120 tablet 0   No facility-administered medications prior to visit.     Social History   Socioeconomic History   Marital status: Married    Spouse name: Garnette   Number of children: 2   Years of education: Not on file   Highest education level: Not on file  Occupational History    Comment: retired  Tobacco Use   Smoking status: Never   Smokeless tobacco: Never  Vaping Use   Vaping status: Never Used  Substance and Sexual Activity   Alcohol use: No    Alcohol/week: 0.0 standard drinks of alcohol   Drug use: Never   Sexual activity: Not Currently  Other Topics Concern   Not on file  Social History Narrative   Not  on file   Social Drivers of Health   Financial Resource Strain: Low Risk  (10/19/2022)   Received from [facility]   Overall Financial Resource Strain (CARDIA)    Difficulty of Paying Living Expenses: Not hard at all  Food Insecurity: No Food Insecurity (09/29/2023)   Hunger Vital Sign    Worried About Running Out of Food in the Last Year: Never true    Ran Out of Food in the Last Year: Never true  Transportation Needs: No Transportation Needs (09/29/2023)   PRAPARE - Administrator, Civil Service (Medical): No    Lack of Transportation (Non-Medical): No  Physical Activity: Inactive (11/15/2016)   Exercise Vital Sign    Days of Exercise per Week: 0 days    Minutes of Exercise per Session: 0 min  Stress: No Stress Concern Present (11/15/2016)   [employer] of Occupational Health - Occupational Stress Questionnaire    Feeling of Stress : Not at all  Social Connections: Moderately Isolated (09/29/2023)   Social Connection and Isolation Panel    Frequency of Communication with Friends and Family: More than  three times a week    Frequency of Social Gatherings with Friends and Family: Once a week    Attends Religious Services: Never    Database Administrator or Organizations: No    Attends Banker Meetings: Never    Marital Status: Married  Catering Manager Violence: Unknown (09/30/2023)   Humiliation, Afraid, Rape, and Kick questionnaire    Fear of Current or Ex-Partner: Not on file    Emotionally Abused: Not on file    Physically Abused: No    Sexually Abused: Not on file      Review of Systems   All other ros negative  Objective:    BP 128/65   Pulse 66   Temp 98.9 F (37.2 C) (Oral)   SpO2 96%  Nursing note and vital signs reviewed.  Physical Exam     General/constitutional: no distress, pleasant HEENT: Normocephalic, PER, Conj Clear, EOMI, Oropharynx clear Neck supple CV: rrr no mrg Lungs: clear to auscultation, normal respiratory effort Abd: Soft, Nontender Ext: no edema Skin: No Rash Neuro: nonfocal MSK: right hand 3rd finger swollen but improved per her relative and patient; no fluctuance; slight erythema around finger; slight tenderness   Labs:  Micro:  Serology:  Imaging:  Assessment & Plan:   Problem List Items Addressed This Visit     Rheumatoid arthritis (HCC)   Other Visit Diagnoses       Osteomyelitis, unspecified site, unspecified type (HCC)    -  Primary   Relevant Orders   CBC   COMPLETE METABOLIC PANEL WITHOUT GFR   C-reactive protein     Immunosuppressed status             No orders of the defined types were placed in this encounter.          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 And also given immunosuppression and  operative finding needing to go into the dip joint, I plan to treat 6 weeks   Patient was placed on linezolid , however developed severe diarrhea and contacted our clinic 10/11/23 transitioned to cefadroxil , but she still have diarrhea so stopped with cessation of diarrhea (therefore they didn't go to urgent care for cdiff testing as discussed)  On 10/9 I called patient and her daughter as they can't make the clinic visit and not able to do televisit [employer]). Advised them to go on doxycycline .   ------------ 11/06/23 id clinic assessment Takes plaquenil , methotrexate , and remicade; the latter 2 being hold now due ot infection  She had a visit in clinic with ortho on 10/20/23 and will f/u this week? Things are clinically improving, less redness today; showed me picture from 3 weeks ago significantly more swollen/red Nausea with doxycycline , helped with food  Some pain in the right third finger dip/pip joint but also general pain in hands due to RA  Overall appearance significantly better  If worse will need repeat biopsy to check fungal/afb cx as well as she is at risk for those   Labs today  Repeat evaluation in 4 weeks  Precaution with doxycycline  discussed   Follow-up: Return in about 4 weeks (around 12/04/2023).      Constance ONEIDA Passer, MD [facility] 11/06/2023, 4:19 PM

## 2023-11-07 ENCOUNTER — Ambulatory Visit: Payer: Self-pay | Admitting: Internal Medicine

## 2023-11-07 DIAGNOSIS — M869 Osteomyelitis, unspecified: Secondary | ICD-10-CM

## 2023-11-07 DIAGNOSIS — N179 Acute kidney failure, unspecified: Secondary | ICD-10-CM

## 2023-11-07 LAB — COMPLETE METABOLIC PANEL WITHOUT GFR
AG Ratio: 1.2 (calc) (ref 1.0–2.5)
ALT: 6 U/L (ref 6–29)
AST: 15 U/L (ref 10–35)
Albumin: 3.9 g/dL (ref 3.6–5.1)
Alkaline phosphatase (APISO): 74 U/L (ref 37–153)
BUN/Creatinine Ratio: 19 (calc) (ref 6–22)
BUN: 30 mg/dL — ABNORMAL HIGH (ref 7–25)
CO2: 27 mmol/L (ref 20–32)
Calcium: 9 mg/dL (ref 8.6–10.4)
Chloride: 95 mmol/L — ABNORMAL LOW (ref 98–110)
Creat: 1.6 mg/dL — ABNORMAL HIGH (ref 0.60–1.00)
Globulin: 3.3 g/dL (ref 1.9–3.7)
Glucose, Bld: 152 mg/dL — ABNORMAL HIGH (ref 65–99)
Potassium: 4.2 mmol/L (ref 3.5–5.3)
Sodium: 134 mmol/L — ABNORMAL LOW (ref 135–146)
Total Bilirubin: 0.4 mg/dL (ref 0.2–1.2)
Total Protein: 7.2 g/dL (ref 6.1–8.1)

## 2023-11-07 LAB — CBC
HCT: 37.8 % (ref 35.0–45.0)
Hemoglobin: 12 g/dL (ref 11.7–15.5)
MCH: 28.3 pg (ref 27.0–33.0)
MCHC: 31.7 g/dL — ABNORMAL LOW (ref 32.0–36.0)
MCV: 89.2 fL (ref 80.0–100.0)
MPV: 10.1 fL (ref 7.5–12.5)
Platelets: 342 Thousand/uL (ref 140–400)
RBC: 4.24 Million/uL (ref 3.80–5.10)
RDW: 14 % (ref 11.0–15.0)
WBC: 12.7 Thousand/uL — ABNORMAL HIGH (ref 3.8–10.8)

## 2023-11-07 LAB — C-REACTIVE PROTEIN: CRP: 3.5 mg/L (ref <8.0)

## 2023-11-07 NOTE — Telephone Encounter (Signed)
 Spoke with Academic Librarian at [facility] with orders to do CBC and CMP in two weeks. HH will have labs drawn on 11/12 and fax results to office.  Phone: [phone] Lorenda CHRISTELLA Code, RMA

## 2023-11-16 LAB — ACID FAST CULTURE WITH REFLEXED SENSITIVITIES (MYCOBACTERIA): Acid Fast Culture: NEGATIVE

## 2023-12-11 ENCOUNTER — Encounter: Payer: Self-pay | Admitting: Internal Medicine

## 2023-12-11 ENCOUNTER — Other Ambulatory Visit: Payer: Self-pay

## 2023-12-11 ENCOUNTER — Ambulatory Visit: Admitting: Internal Medicine

## 2023-12-11 VITALS — BP 111/71 | HR 104 | Temp 97.8°F | Ht 65.0 in

## 2023-12-11 DIAGNOSIS — M00841 Arthritis due to other bacteria, right hand: Secondary | ICD-10-CM

## 2023-12-11 DIAGNOSIS — Z880 Allergy status to penicillin: Secondary | ICD-10-CM | POA: Diagnosis not present

## 2023-12-11 DIAGNOSIS — M069 Rheumatoid arthritis, unspecified: Secondary | ICD-10-CM | POA: Diagnosis not present

## 2023-12-11 DIAGNOSIS — M86641 Other chronic osteomyelitis, right hand: Secondary | ICD-10-CM

## 2023-12-11 DIAGNOSIS — D84821 Immunodeficiency due to drugs: Secondary | ICD-10-CM | POA: Diagnosis not present

## 2023-12-11 DIAGNOSIS — M869 Osteomyelitis, unspecified: Secondary | ICD-10-CM

## 2023-12-11 NOTE — Patient Instructions (Signed)
 Your infection appeared cured  This will further be ascertained in the next 4-6 weeks if it remains like this or better   Call me in the next 4-6 weeks if >3 days of persistent progressive pain, swelling, redness of the finger   Thank you

## 2023-12-11 NOTE — Progress Notes (Signed)
 [facility]  Patient Active Problem List   Diagnosis Date Noted   Recurrent major depressive disorder, in partial remission 10/11/2023   Abscess of right middle finger 10/03/2023   Acute metabolic encephalopathy 09/29/2023   Acute kidney injury superimposed on stage 3a chronic kidney disease (HCC) 09/29/2023   Infection due to non-invasive methicillin sensitive Staphylococcus aureus 09/28/2023   History of difficult venous access 04/12/2023   Neurogenic incontinence 01/09/2023   E. coli UTI (urinary tract infection) 10/29/2022   Rheumatoid arthritis (HCC) 10/29/2022   Stool incontinence 10/28/2022   Mild cognitive impairment 06/03/2022   Heberden's nodes 06/03/2022   Ambulatory dysfunction 02/01/2022   Lymphedema 04/26/2021   Lumbar lateral recess stenosis (Bilateral) (L2-3, L3-4, L4-5) (Severe: L3-4) 05/28/2020   DDD (degenerative disc disease), lumbosacral 03/10/2020   Chronic lower extremity pain (2ry area of Pain) (Bilateral) 02/04/2020   Sciatica (Left) 12/31/2019   Dropfoot (Left) 11/20/2019   Weakness of leg (Left) 11/20/2019   Chronic hand pain (Left) 10/10/2019   Paroxysmal atrial fibrillation (HCC) 10/16/2018   Coronary artery disease involving native coronary artery of native heart without angina pectoris 10/15/2018   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   Immunosuppression - Plaquenil  09/18/2018   Primary insomnia 07/09/2018   Fibromyalgia syndrome 07/11/2016   Snoring 07/11/2016   Opioid-induced constipation (OIC) 04/21/2016   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) 02/09/2015   Lumbar foraminal stenosis (Bilateral L3-4 and L5-S1) 02/09/2015   Lumbar spinal stenosis (5 mm Severe L3-4; 8 mm L4-5) w/ neurogenic claudication 11/07/2014   Chronic bilateral low back pain with bilateral sciatica (L>R) 11/07/2014   Long term current use of opiate analgesic 11/05/2014   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)  11/05/2014   Obesity, Class II, BMI 35-39.9 10/27/2014   Type 2 diabetes mellitus (HCC) 03/19/2014   Essential (primary) hypertension 10/14/2013   MDD (major depressive disorder), recurrent episode, mild 10/14/2013   Insomnia secondary to chronic pain 07/09/2013   GAD (generalized anxiety disorder) 07/09/2013   GERD (gastroesophageal reflux disease) 01/18/2013   Acquired hypothyroidism 01/18/2013   HLD (hyperlipidemia) 01/18/2013   Seronegative rheumatoid arthritis (HCC) 01/18/2013   CKD stage 3a, GFR 45-59 ml/min (HCC) 01/17/2013   Congestive heart failure with left ventricular systolic dysfunction (HCC) 11/10/2012      Subjective:    Patient ID: Laura [name], female    DOB: 31-May-1945, 78 y.o.   MRN: [id number]  Chief Complaint  Patient presents with   Follow-up    HPI:  Laura [name] is a 78 y.o. female here for f/u finger osteomyelitis  She had I&D on 9/19 and that cx grew mssa (S doxy, bactrim) Diarrhea with linezolid /cefadroxil  Tolerating doxy now  F/u ortho recently and in another week Redness/pain better  Not taking methotrexate  now or getting remicade in setting of infection On plaquenil   12/11/23 id clinic f/u See a&p for detail    Allergies  Allergen Reactions   Bupropion Other (See Comments)    Siezures   Jardiance [Empagliflozin] Nausea And Vomiting and Other (See Comments)    Severe Projectile vomiting   Penicillins Nausea And Vomiting and Rash   Statins Diarrhea and Other (See Comments)    Intolerance at higher doses and frequency.    Melatonin Other (See Comments)    Gagging, nausea and vomiting the following day      Outpatient Medications Prior to Visit  Medication Sig Dispense Refill   acetaminophen  (TYLENOL ) 500 MG tablet Take  500-1,000 mg by mouth every 6 (six) hours as needed for moderate pain (pain score 4-6).     aspirin  EC 81 MG tablet Take 81 mg by mouth daily. Swallow whole.     doxepin  (SINEQUAN ) 50 MG capsule Take 1  capsule (50 mg total) by mouth at bedtime. 90 capsule 3   DULoxetine  (CYMBALTA ) 60 MG capsule Take 1 capsule (60 mg total) by mouth daily. Stop 20 mg , dose change 90 capsule 3   folic acid  (FOLVITE ) 1 MG tablet Take 1 mg by mouth daily.     furosemide  (LASIX ) 80 MG tablet Take 80 mg by mouth every Monday, Wednesday, and Friday.     hydroxychloroquine  (PLAQUENIL ) 200 MG tablet Take 200 mg by mouth daily.     levothyroxine  (SYNTHROID ) 125 MCG tablet Take 250 mcg by mouth daily before breakfast.     loratadine  (CLARITIN ) 10 MG tablet Take 10 mg by mouth daily.      metoprolol  succinate (TOPROL -XL) 100 MG 24 hr tablet Take 1 tablet (100 mg total) by mouth daily. Take with or immediately following a meal. 30 tablet 0   OMEPRAZOLE PO Take by mouth.     oxyCODONE  (OXY IR/ROXICODONE ) 5 MG immediate release tablet Take 1 tablet (5 mg total) by mouth every 6 (six) hours as needed for severe pain (pain score 7-10). Must last 30 days. 120 tablet 0   potassium chloride  (KLOR-CON ) 10 MEQ tablet Take 10 mEq by mouth every Monday, Wednesday, and Friday.     rosuvastatin  (CRESTOR ) 5 MG tablet Take 5 mg by mouth 2 (two) times a week. (Sunday and Friday)     famotidine  (PEPCID ) 20 MG tablet Take 20 mg by mouth daily. (Patient not taking: Reported on 12/11/2023)     inFLIXimab (REMICADE) 100 MG injection Inject 100 mg into the vein every 8 (eight) weeks.     methotrexate  (RHEUMATREX) 2.5 MG tablet Take 15 mg by mouth every Sunday.  5   oxyCODONE  (OXY IR/ROXICODONE ) 5 MG immediate release tablet Take 1 tablet (5 mg total) by mouth every 6 (six) hours as needed for severe pain (pain score 7-10). Must last 30 days. 120 tablet 0   [START ON 12/25/2023] oxyCODONE  (OXY IR/ROXICODONE ) 5 MG immediate release tablet Take 1 tablet (5 mg total) by mouth every 6 (six) hours as needed for severe pain (pain score 7-10). Must last 30 days. 120 tablet 0   No facility-administered medications prior to visit.     Social History    Socioeconomic History   Marital status: Married    Spouse name: Garnette   Number of children: 2   Years of education: Not on file   Highest education level: Not on file  Occupational History    Comment: retired  Tobacco Use   Smoking status: Never   Smokeless tobacco: Never  Vaping Use   Vaping status: Never Used  Substance and Sexual Activity   Alcohol use: No    Alcohol/week: 0.0 standard drinks of alcohol   Drug use: Never   Sexual activity: Not Currently  Other Topics Concern   Not on file  Social History Narrative   Not on file   Social Drivers of Health   Financial Resource Strain: Low Risk  (10/19/2022)   Received from [facility]   Overall Financial Resource Strain (CARDIA)    Difficulty of Paying Living Expenses: Not hard at all  Food Insecurity: No Food Insecurity (09/29/2023)   Hunger Vital Sign  Worried About Programme Researcher, Broadcasting/film/video in the Last Year: Never true    Ran Out of Food in the Last Year: Never true  Transportation Needs: No Transportation Needs (09/29/2023)   PRAPARE - Administrator, Civil Service (Medical): No    Lack of Transportation (Non-Medical): No  Physical Activity: Inactive (11/15/2016)   Exercise Vital Sign    Days of Exercise per Week: 0 days    Minutes of Exercise per Session: 0 min  Stress: No Stress Concern Present (11/15/2016)   [employer] of Occupational Health - Occupational Stress Questionnaire    Feeling of Stress : Not at all  Social Connections: Moderately Isolated (09/29/2023)   Social Connection and Isolation Panel    Frequency of Communication with Friends and Family: More than three times a week    Frequency of Social Gatherings with Friends and Family: Once a week    Attends Religious Services: Never    Database Administrator or Organizations: No    Attends Banker Meetings: Never    Marital Status: Married  Catering Manager Violence: Unknown (09/30/2023)   Humiliation,  Afraid, Rape, and Kick questionnaire    Fear of Current or Ex-Partner: Not on file    Emotionally Abused: Not on file    Physically Abused: No    Sexually Abused: Not on file      Review of Systems     Objective:    BP 111/71   Pulse (!) 104   Temp 97.8 F (36.6 C) (Temporal)   Ht 5' 5 (1.651 m)   SpO2 97%   BMI 38.11 kg/m  Nursing note and vital signs reviewed.  Physical Exam      General/constitutional: no distress, pleasant HEENT: Normocephalic, PER, Conj Clear, EOMI, Oropharynx clear Neck supple CV: rrr no mrg Lungs: clear to auscultation, normal respiratory effort Abd: Soft, Nontender Ext: no edema Skin: No Rash Neuro: nonfocal MSK: no peripheral joint swelling/tenderness/warmth; back spines nontender 12/11/23 pics       Labs: Lab Results  Component Value Date   WBC 12.7 (H) 11/06/2023   HGB 12.0 11/06/2023   HCT 37.8 11/06/2023   MCV 89.2 11/06/2023   PLT 342 11/06/2023   Last metabolic panel Lab Results  Component Value Date   GLUCOSE 152 (H) 11/06/2023   NA 134 (L) 11/06/2023   K 4.2 11/06/2023   CL 95 (L) 11/06/2023   CO2 27 11/06/2023   BUN 30 (H) 11/06/2023   CREATININE 1.60 (H) 11/06/2023   GFRNONAA >60 10/02/2023   CALCIUM  9.0 11/06/2023   PHOS 3.2 09/30/2023   PROT 7.2 11/06/2023   ALBUMIN 3.5 09/29/2023   BILITOT 0.4 11/06/2023   ALKPHOS 53 09/29/2023   AST 15 11/06/2023   ALT 6 11/06/2023   ANIONGAP 11 10/02/2023   Lab Results  Component Value Date   CRP 3.5 11/06/2023   Lab Results  Component Value Date   ESRSEDRATE 28 (H) 09/29/2023    Micro:  Serology:  Imaging:  Assessment & Plan:   Problem List Items Addressed This Visit   None Visit Diagnoses       Osteomyelitis, unspecified site, unspecified type (HCC)    -  Primary          No orders of the defined types were placed in this encounter.          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 And also given immunosuppression and operative finding needing to go into the dip joint, I plan to treat 6 weeks   Patient was placed on linezolid , however developed severe diarrhea and contacted our clinic 10/11/23 transitioned to cefadroxil , but she still have diarrhea so stopped with cessation of diarrhea (therefore they didn't go to urgent care for cdiff testing as discussed)  On 10/9 I called patient and her daughter as they can't make the clinic visit and not able to do televisit [employer]). Advised them to go on doxycycline .   ------------ 11/06/23 id clinic assessment Takes plaquenil , methotrexate , and remicade; the latter 2 being hold now due ot infection  She had a visit in clinic with ortho on 10/20/23 and will f/u this week? Things are clinically improving, less redness today; showed me picture from 3 weeks ago significantly more swollen/red Nausea with doxycycline , helped with food  Some pain in the right third finger dip/pip joint but also general pain in hands due to RA  Overall appearance significantly better  If worse will need repeat biopsy to check fungal/afb cx as well as she is at risk for those   Labs today  Repeat evaluation in 4 weeks  Precaution with doxycycline  discussed   ---------- 12/11/23 id assessment Finger look great today no sign of active infection Finished doxy a week prior basically had 8 weeks abx  Will continue off abx Labs normal when she was off abx for a few days a month prior; no reason to get it now especially her RA could be in a flare  Continue to monitor for 4-6 weeks for persistent/progressive sign of infection and let me know  No need to f/u at this time otherwise    Follow-up:  No follow-ups on file.      Constance ONEIDA Passer, MD [facility] 12/11/2023, 2:49 PM

## 2023-12-14 ENCOUNTER — Telehealth: Payer: Self-pay

## 2023-12-14 DIAGNOSIS — F3342 Major depressive disorder, recurrent, in full remission: Secondary | ICD-10-CM

## 2023-12-14 MED ORDER — DULOXETINE HCL 30 MG PO CPEP: 30.0000 mg | ORAL_CAPSULE | Freq: Every day | ORAL | 1 refills | Status: DC

## 2023-12-14 NOTE — Addendum Note (Signed)
 Addended byBETHA COBY HEIGHT on: 12/14/2023 02:04 PM   Modules accepted: Orders

## 2023-12-14 NOTE — Telephone Encounter (Signed)
 Patients daughter left voicemail stating that she is not able to bring her mother in for an in person visit she does not have any PTO to take off and she does not have anyone else to bring her in her father can not drive her she has concerns about her mothers confusion she seems to think its getting worst and she would like to know what can be done without bringing the patient in she thinks that maybe a dosage change would help please advise

## 2023-12-14 NOTE — Telephone Encounter (Addendum)
 Will recommend reducing the duloxetine  to a 30 mg daily for 2 weeks and if her symptoms does not improve she could discontinue the duloxetine  completely.  However for any further medication changes she will need an evaluation.  Also if she is having episodes of confusion I will recommend a medical workup and hence she may need to be taken to her primary care provider or an urgent care.  Telemedicine appointments are limited and hence she will need an in person visit for any further medication adjustment.

## 2023-12-15 NOTE — Telephone Encounter (Signed)
 Spoke to patients daughter Vertell advise her of the dose change and she voiced understanding

## 2024-01-07 ENCOUNTER — Other Ambulatory Visit: Payer: Self-pay | Admitting: Psychiatry

## 2024-01-07 DIAGNOSIS — F3342 Major depressive disorder, recurrent, in full remission: Secondary | ICD-10-CM

## 2024-01-22 ENCOUNTER — Telehealth: Payer: Self-pay

## 2024-01-22 NOTE — Telephone Encounter (Signed)
 Left message for Laura [name], informing her okay for patient to resume RA medications.  Reona Zendejas [name], CMA

## 2024-01-22 NOTE — Telephone Encounter (Signed)
 Elsie Common, PA at [facility] called to ask if patient is ok to resume [employer] - methotrexate  and remicade. Call back is [phone].   Abdulrahman Bracey [name], CMA

## 2024-01-22 NOTE — Telephone Encounter (Signed)
It is [place] with .  

## 2024-01-24 NOTE — Progress Notes (Signed)
 PROVIDER NOTE: Interpretation of information contained herein should be left to medically-trained personnel. Specific patient instructions are provided elsewhere under Patient Instructions section of medical record. This document was created in part using AI and STT-dictation technology, any transcriptional errors that may result from this process are unintentional.  Patient: Laura [name]  Service: E/M   PCP: [name], MD  DOB: 1945/05/28  DOS: 01/25/2024  Provider: Emmy MARLA Blanch, NP  MRN: [id number]  Delivery: Face-to-face  Specialty: Interventional Pain Management  Type: Established Patient  Setting: Ambulatory outpatient facility  Specialty designation: 09  Referring Prov.: [name], MD  Location: Outpatient office facility       History of present illness (HPI) [name], a 79 y.o. year old female, is here today because of her Chronic bilateral low back pain with bilateral sciatica [M54.42, M54.41, G89.29]. [name]'s primary complain today is low back pain.   Pertinent problems: 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  Pain Assessment: Severity of   is reported as a  /10. Location:    / . Onset:  . Quality:  . Timing:  . Modifying factor(s):  SABRA Vitals:  height is 5' 5 (1.651 m) and weight is 222 lb (100.7 kg). Her temporal temperature is 97.2 F (36.2 C) (abnormal). Her blood pressure is 139/89 and her pulse is 70. Her respiration is 18 and oxygen saturation is 97%.  BMI: Estimated body mass index is 36.94 kg/m as calculated from the following:   Height as of this encounter: 5' 5 (1.651 m).   Weight as of this encounter: 222 lb (100.7 kg).  Last encounter: 10/26/2023. Last procedure: Visit date not found.  Reason for encounter: medication management.  The patient indicates doing well with current medication regimen. No side effects or adverse reaction reported to medication.  The patient presents with back pain she accompanied by her her daughter, who is primary caregiver.  She experiences persistent back pain, which she attributes to her rheumatoid arthritis.  The pain radiates to her legs, feet, and hands, with noticeable deformities in her knuckles.  She described her hands as having knobs and notes that her knuckles have recently worsened.   Discussed the use of AI scribe software for clinical note transcription with the patient, who gave verbal consent to proceed.  History of Present Illness   Laura [name] is a 79 year old female with rheumatoid arthritis who presents for pain management.  She experiences significant back pain and symptoms related to rheumatoid arthritis, particularly affecting her hands. One hand is notably larger than the other, and she has difficulty with her fingers, especially her thumbs. She describes 'knobs' on her fingers, more pronounced on her dominant right hand.  She has a history of using prednisone  for hand swelling, which previously provided  relief. However, her current rheumatologist has opted not to prescribe prednisone , resulting  in prolonged swelling lasting a couple of weeks, during which she had limited use of her hands.  She is currently taking oxycodone  5 mg every six hours, totaling four doses per day, to manage her pain. She reports no side effects from the medication but needs this regimen to maintain her functionality.  In her social history, her husband is unable to drive due to leg problems, and she has not driven in fifteen years. Her daughter is concerned about her driving due to this long hiatus.     Pharmacotherapy Assessment   Analgesic: Oxycodone  HCl (IR) 5 mg tablet every 6 hours as needed for severe pain.SABRA MME=30 Monitoring: [place] PMP: PDMP reviewed during this encounter.       Pharmacotherapy: No side-effects or adverse reactions reported. Compliance: No problems identified. Effectiveness: Clinically acceptable.  Shela Reda CROME, RN  01/25/2024  2:26 PM  Sign when Signing Visit Nursing Pain Medication Assessment:  Safety precautions to be maintained throughout the outpatient stay will include: orient to surroundings, keep bed in low position, maintain call bell within reach at all times, provide assistance with transfer out of bed and ambulation.  Medication Inspection Compliance: Pill count conducted under aseptic conditions, in front of the patient. Neither the pills nor the bottle was removed from the patient's sight at any time. Once count was completed pills were immediately returned to the patient in their original bottle.  Medication: Oxycodone  IR Pill/Patch Count: 75 of 120 pills/patches remain Pill/Patch Appearance: Markings consistent with prescribed medication Bottle Appearance: Standard pharmacy container. Clearly labeled. Filled Date: 51 / 21 / 2025 Last Medication intake:  Yesterday    UDS:  Summary  Date Value Ref Range Status  06/22/2021 Note  Final    Comment:    ==================================================================== [employer] [facility] [employer]  (MW) ==================================================================== Test                             Result       Flag       Units  Drug Present and Declared for Prescription Verification   Oxycodone                       1634         EXPECTED   ng/mg creat   Oxymorphone                    770          EXPECTED   ng/mg creat   Noroxycodone                   2933         EXPECTED   ng/mg creat   Noroxymorphone                 191          EXPECTED   ng/mg creat    Sources of oxycodone  are scheduled prescription medications.    Oxymorphone, noroxycodone, and noroxymorphone are expected    metabolites of oxycodone . Oxymorphone is also available as a    scheduled prescription medication.  ==================================================================== Test                      Result    Flag   Units      Ref Range   Creatinine  94               mg/dL      >=79 ==================================================================== Declared Medications:  The flagging and interpretation on this report are based on the  following declared medications.  Unexpected results may arise from  inaccuracies in the declared medications.   **Note: The testing scope of this panel includes these medications:   Oxycodone  (OxyIR)   **Note: The testing scope of this panel does not include the  following reported medications:   Albuterol  (Ventolin  HFA)  Aspirin   Azelastine (Astelin)  Duloxetine  (Cymbalta )  Folic Acid   Furosemide  (Lasix )  Hydroxychloroquine  (Plaquenil )  Hydroxyzine  (Vistaril )  Infliximab (Remicade)  Levothyroxine  (Synthroid )  Loratadine  (Claritin )  Methotrexate   Metoprolol  (Toprol )  Multivitamin  Ondansetron  (Zofran )  Rosuvastatin  (Crestor )  Spironolactone  (Aldactone )  Trazodone  (Desyrel )  Vitamin D  ==================================================================== For clinical consultation, please call [phone]. ====================================================================     No results found for: CBDTHCR No results found for: D8THCCBX No results found for: D9THCCBX  ROS  Constitutional: Denies any fever or chills Gastrointestinal: No reported hemesis, hematochezia, vomiting, or acute GI distress Musculoskeletal: low back pain  Neurological: No reported episodes of acute onset apraxia, aphasia, dysarthria, agnosia, amnesia, paralysis, loss of coordination, or loss of consciousness  Medication Review  DULoxetine , Omeprazole, acetaminophen , aspirin  EC, doxepin , famotidine , folic acid , furosemide , hydroxychloroquine , inFLIXimab, levothyroxine , loratadine , methotrexate , metoprolol  succinate, oxyCODONE , potassium chloride , and rosuvastatin   History Review  Allergy: Ms. Zaldivar is allergic to bupropion, jardiance [empagliflozin], penicillins, statins, and melatonin. Drug: Ms. Schouten  reports no history of drug use. Alcohol:  reports no history of alcohol use. Tobacco:  reports that she has never smoked. She has never used smokeless tobacco. Social: Ms. Walkup  reports that she has never smoked. She has never used smokeless tobacco. She reports that she does not drink alcohol and does not use drugs. Medical:  has a past medical history of Abnormal MRI, lumbar spine (04/30/2021) (02/19/2020), Allergy, Anxiety, Aortic atherosclerosis, Arthritis, degenerative (10/08/2013), Asthma, Cardiomegaly, Cerebrovascular accident (CVA) (HCC) (01/02/2020), Chronic kidney disease, stage 3 (HCC), Chronic, continuous use of opioids, Coronary artery disease involving native coronary artery of native heart without angina pectoris, DDD (degenerative disc disease), lumbar, Depression, Diet-controlled type 2 diabetes mellitus (HCC), Displaced fracture of right femoral neck (HCC) (02/02/2022), Essential hypertension, Fall at home, initial encounter (02/01/2022), Fracture of transverse process of lumbar  vertebra, closed, initial encounter (HCC) (02/01/2022), GERD (gastroesophageal reflux disease), Heberden's nodes, HFrEF (heart failure with reduced ejection fraction) (HCC), History of bilateral cataract extraction, History of femur fracture (Right) (02/09/2015), 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), Hypothyroidism, Insomnia, Long term methotrexate  user, Long-term use of aspirin  therapy, Long-term use of hydroxychloroquine , Long-term use of infliximab, 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 Surgical: Ms. Simson  has a past surgical history that includes Abdominal hysterectomy; Cesarean section; Total knee arthroplasty (Left); Hip fracture surgery (Right); Orif pelvic fracture with percutaneous screws (Bilateral, 02/02/2022); Diagnostic laparoscopy; Cataract extraction w/ intraocular lens  implant, bilateral; LEFT HEART CATH AND CORONARY ANGIOGRAPHY (Left, 11/24/2022); and Incision and drainage abscess (Right, 09/29/2023). Family: family history includes Alcohol abuse in her father; Depression in her father and sister; Heart attack in her father;  Hypertension in her mother and sister; Post-traumatic stress disorder in her father; Rheum arthritis in her sister; Stroke in her mother.  Laboratory Chemistry Profile   Renal Lab Results  Component Value Date   BUN 30 (H) 11/06/2023   CREATININE 1.60 (H) 11/06/2023   LABCREA 47 09/29/2023   BCR 19 11/06/2023   GFRAA 57 (L) 10/03/2019   GFRNONAA >60  10/02/2023    Hepatic Lab Results  Component Value Date   AST 15 11/06/2023   ALT 6 11/06/2023   ALBUMIN 3.5 09/29/2023   ALKPHOS 53 09/29/2023   LIPASE 22 01/14/2021    Electrolytes Lab Results  Component Value Date   NA 134 (L) 11/06/2023   K 4.2 11/06/2023   CL 95 (L) 11/06/2023   CALCIUM  9.0 11/06/2023   MG 2.0 09/30/2023   PHOS 3.2 09/30/2023    Bone Lab Results  Component Value Date   VD25OH See Scanned report in [facility] 02/03/2022   25OHVITD1 36 07/15/2015   25OHVITD2 3.6 07/15/2015   25OHVITD3 32 07/15/2015    Inflammation (CRP: Acute Phase) (ESR: Chronic Phase) Lab Results  Component Value Date   CRP 3.5 11/06/2023   ESRSEDRATE 28 (H) 09/29/2023   LATICACIDVEN 1.0 09/28/2023         Note: Above Lab results reviewed.  Recent Imaging Review  US  RENAL CLINICAL DATA:  Complicated UTI.  EXAM: RENAL / URINARY TRACT ULTRASOUND COMPLETE  COMPARISON:  01/31/2022.  FINDINGS: Right Kidney:  Renal measurements: 8.5 x 4.0 x 4.4 cm = volume: 79.5 mL. Echogenicity within normal limits. A cyst is present in the lower pole measuring 1.1 x 0.8 x 1.3 cm. No mass or hydronephrosis visualized.  Left Kidney:  Renal measurements: 8.4 x 4.6 x 4.4 cm = volume: 89.8 mL. Echogenicity within normal limits. A slightly complex lesion with internal echoes is present in the upper pole measuring 1.0 x 1.0 x 0.9 cm. No mass or hydronephrosis visualized.  Bladder:  Appears normal for degree of bladder distention. Bilateral ureteral jets are seen.  Other:  Limited evaluation due to patient's body habitus and limited mobility.  IMPRESSION: 1. No hydronephrosis bilaterally. 2. Bilateral renal atrophy with right renal cyst. 3. Slightly complex with internal echoes lesion in the upper pole of the left kidney with indeterminate imaging characteristics. MRI is recommended for further evaluation to exclude the possibility of underlying neoplasm.  Electronically  Signed   By: Leita Birmingham M.D.   On: 09/30/2023 17:37 Note: Reviewed        Physical Exam  Vitals: BP 139/89 (Cuff Size: Normal)   Pulse 70   Temp (!) 97.2 F (36.2 C) (Temporal)   Resp 18   Ht 5' 5 (1.651 m)   Wt 222 lb (100.7 kg)   SpO2 97%   BMI 36.94 kg/m  BMI: Estimated body mass index is 36.94 kg/m as calculated from the following:   Height as of this encounter: 5' 5 (1.651 m).   Weight as of this encounter: 222 lb (100.7 kg). Ideal: Ideal body weight: 57 kg (125 lb 10.6 oz) Adjusted ideal body weight: 74.5 kg (164 lb 3.2 oz) General appearance: Well nourished, well developed, and well hydrated. In no apparent acute distress Mental status: Alert, oriented x 3 (person, place, & time)       Respiratory: No evidence of acute respiratory distress Eyes: PERLA  Musculoskeletal: +LBP  Assessment   Diagnosis Status  1. Chronic low back pain (1ry area of Pain) (Bilateral) (L>R)  w/ sciatica (Bilateral)   2. Chronic pain syndrome   3. Pharmacologic therapy   4. Chronic lower extremity pain (2ry area of Pain) (Bilateral)   5. Lumbar Grade 1 Anterolisthesis of L3/L4 and L4/L5   6. 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 Lumbar lateral recess stenosis (Bilateral) (L2-3, L3-4, L4-5) (Severe: L3-4)   8. Ligamentum flavum hypertrophy (L1-2, L2-3, L3-4)   9. Lumbar foraminal stenosis (Bilateral L3-4 and L5-S1)   10. Lumbar facet syndrome (Bilateral) (L>R)   11. Chronic hip pain (Left)    Controlled Controlled Controlled   Updated Problems: Problem  Chronic Pain Syndrome    Plan of Care  Problem-specific:  Assessment and Plan    Chronic pain syndrome Managed with oxycodone  5 mg every six hours. No side effects. Discussed dosage increase; previous provider hesitant. - Continue oxycodone  5 mg every six hours. - Sent prescription to pharmacy for pickup on January 20th.  Chronic low back pain with bilateral sciatica No  changes in management discussed. - Continue current pain management regimen.   Rheumatoid arthritis Severe symptoms in hands, legs, and feet with knuckle deformities and deviation. No new medication side effects.  Pharmacologic therapy: Patient's pain is controlled with oxycodone , will continue on current medication regimen.  Prescribing drug monitoring (PMP reviewed; findings consistent with the use of prescribed medication and no evidence of narcotic misuse or abuse.  Urine drug screening (UDS) up-to-date.  No side effects or adverse reaction reported to medication. Schedule follow-up in 90 days for medication management.       Ms. Darryl Blumenstein has a current medication list which includes the following long-term medication(s): doxepin , duloxetine , loratadine , metoprolol  succinate, [Paused] infliximab, [START ON 01/30/2024] oxycodone , [START ON 02/29/2024] oxycodone , and [START ON 03/30/2024] oxycodone .  Pharmacotherapy (Medications Ordered): Meds ordered this encounter  Medications   oxyCODONE  (OXY IR/ROXICODONE ) 5 MG immediate release tablet    Sig: Take 1 tablet (5 mg total) by mouth every 6 (six) hours as needed for severe pain (pain score 7-10). Must last 30 days.    Dispense:  120 tablet    Refill:  0    Chronic Pain: STOP Act (Not applicable) Fill 1 day early if closed on refill date. Avoid benzodiazepines within 8 hours of opioids   oxyCODONE  (OXY IR/ROXICODONE ) 5 MG immediate release tablet    Sig: Take 1 tablet (5 mg total) by mouth every 6 (six) hours as needed for severe pain (pain score 7-10). Must last 30 days.    Dispense:  120 tablet    Refill:  0    Chronic Pain: STOP Act (Not applicable) Fill 1 day early if closed on refill date. Avoid benzodiazepines within 8 hours of opioids   oxyCODONE  (OXY IR/ROXICODONE ) 5 MG immediate release tablet    Sig: Take 1 tablet (5 mg total) by mouth every 6 (six) hours as needed for severe pain (pain score 7-10). Must last 30 days.     Dispense:  120 tablet    Refill:  0    Chronic Pain: STOP Act (Not applicable) Fill 1 day early if closed on refill date. Avoid benzodiazepines within 8 hours of opioids   Orders:  Orders Placed This Encounter  Procedures   Drug Screen 10 W/Conf, Serum    Release to patient:   Immediate       Return in about 3 months (around 04/24/2024) for (F2F), (MM), Emmy Blanch NP.    Recent Visits No  visits were found meeting these conditions. Showing recent visits within past 90 days and meeting all other requirements Today's Visits Date Type Provider Dept  01/25/24 Office Visit Rose-Marie Hickling K, NP [facility]  Showing today's visits and meeting all other requirements Future Appointments No visits were found meeting these conditions. Showing future appointments within next 90 days and meeting all other requirements  I discussed the assessment and treatment plan with the patient. The patient was provided an opportunity to ask questions and all were answered. The patient agreed with the plan and demonstrated an understanding of the instructions.  Patient advised to call back or seek an in-person evaluation if the symptoms or condition worsens.  I personally spent a total of 30 minutes in the care of the patient today including preparing to see the patient, getting/reviewing separately obtained history, performing a medically appropriate exam/evaluation, counseling and educating, placing orders, referring and communicating with other health care professionals, documenting clinical information in the EHR, independently interpreting results, communicating results, and coordinating care.   Note by: Trent Theisen K Kaitlynd Phillips, NP (TTS and AI technology used. I apologize for any typographical errors that were not detected and corrected.) Date: 01/25/2024; Time: 2:41 PM

## 2024-01-25 ENCOUNTER — Encounter: Payer: Self-pay | Admitting: Nurse Practitioner

## 2024-01-25 ENCOUNTER — Ambulatory Visit: Attending: Nurse Practitioner | Admitting: Nurse Practitioner

## 2024-01-25 VITALS — BP 139/89 | HR 70 | Temp 97.2°F | Resp 18 | Ht 65.0 in | Wt 222.0 lb

## 2024-01-25 DIAGNOSIS — M2428 Disorder of ligament, vertebrae: Secondary | ICD-10-CM | POA: Insufficient documentation

## 2024-01-25 DIAGNOSIS — M47816 Spondylosis without myelopathy or radiculopathy, lumbar region: Secondary | ICD-10-CM | POA: Diagnosis present

## 2024-01-25 DIAGNOSIS — Z79899 Other long term (current) drug therapy: Secondary | ICD-10-CM | POA: Diagnosis present

## 2024-01-25 DIAGNOSIS — M48062 Spinal stenosis, lumbar region with neurogenic claudication: Secondary | ICD-10-CM | POA: Insufficient documentation

## 2024-01-25 DIAGNOSIS — M79604 Pain in right leg: Secondary | ICD-10-CM | POA: Diagnosis present

## 2024-01-25 DIAGNOSIS — M48061 Spinal stenosis, lumbar region without neurogenic claudication: Secondary | ICD-10-CM | POA: Insufficient documentation

## 2024-01-25 DIAGNOSIS — M5442 Lumbago with sciatica, left side: Secondary | ICD-10-CM | POA: Diagnosis present

## 2024-01-25 DIAGNOSIS — M25552 Pain in left hip: Secondary | ICD-10-CM | POA: Diagnosis present

## 2024-01-25 DIAGNOSIS — M5441 Lumbago with sciatica, right side: Secondary | ICD-10-CM | POA: Insufficient documentation

## 2024-01-25 DIAGNOSIS — G894 Chronic pain syndrome: Secondary | ICD-10-CM | POA: Diagnosis present

## 2024-01-25 DIAGNOSIS — G8929 Other chronic pain: Secondary | ICD-10-CM | POA: Insufficient documentation

## 2024-01-25 DIAGNOSIS — M4316 Spondylolisthesis, lumbar region: Secondary | ICD-10-CM | POA: Diagnosis present

## 2024-01-25 DIAGNOSIS — M79605 Pain in left leg: Secondary | ICD-10-CM | POA: Insufficient documentation

## 2024-01-25 MED ORDER — OXYCODONE HCL 5 MG PO TABS: 5.0000 mg | ORAL_TABLET | Freq: Four times a day (QID) | ORAL | 0 refills | Status: AC | PRN

## 2024-01-25 NOTE — Patient Instructions (Addendum)
 " ______________________________________________________________________    Opioid Pain Medication Update  To: All patients taking opioid pain medications. (I.e.: hydrocodone , hydromorphone , oxycodone , oxymorphone, morphine , codeine, methadone, tapentadol, tramadol, buprenorphine, fentanyl , etc.)  Re: Updated review of side effects and adverse reactions of opioid analgesics, as well as new information about long term effects of this class of medications.  Direct risks of long-term opioid therapy are not limited to opioid addiction and overdose. Potential medical risks include serious fractures, breathing problems during sleep, hyperalgesia, immunosuppression, chronic constipation, bowel obstruction, myocardial infarction, and tooth decay secondary to xerostomia.  Unpredictable adverse effects that can occur even if you take your medication correctly: Cognitive impairment, respiratory depression, and death. Most people think that if they take their medication correctly, and as instructed, that they will be safe. Nothing could be farther from the truth. In reality, a significant amount of recorded deaths associated with the use of opioids has occurred in individuals that had taken the medication for a long time, and were taking their medication correctly. The following are examples of how this can happen: Patient taking his/her medication for a long time, as instructed, without any side effects, is given a certain antibiotic or another unrelated medication, which in turn triggers a Drug-to-drug interaction leading to disorientation, cognitive impairment, impaired reflexes, respiratory depression or an untoward event leading to serious bodily harm or injury, including death.  Patient taking his/her medication for a long time, as instructed, without any side effects, develops an acute impairment of liver and/or kidney function. This will lead to a rapid inability of the body to breakdown and eliminate  their pain medication, which will result in effects similar to an overdose, but with the same medicine and dose that they had always taken. This again may lead to disorientation, cognitive impairment, impaired reflexes, respiratory depression or an untoward event leading to serious bodily harm or injury, including death.  A similar problem will occur with patients as they grow older and their liver and kidney function begins to decrease as part of the aging process.  Background information: Historically, the original case for using long-term opioid therapy to treat chronic noncancer pain was based on safety assumptions that subsequent experience has called into question. In 1996, the American Pain Society and the American Academy of Pain Medicine issued a consensus statement supporting long-term opioid therapy. This statement acknowledged the dangers of opioid prescribing but concluded that the risk for addiction was low; respiratory depression induced by opioids was short-lived, occurred mainly in opioid-naive patients, and was antagonized by pain; tolerance was not a common problem; and efforts to control diversion should not constrain opioid prescribing. This has now proven to be wrong. Experience regarding the risks for opioid addiction, misuse, and overdose in community practice has failed to support these assumptions.  According to the Centers for Disease Control and Prevention, fatal overdoses involving opioid analgesics have increased sharply over the past decade. Currently, more than 96,700 people die from drug overdoses every year. Opioids are a factor in 7 out of every 10 overdose deaths. Deaths from drug overdose have surpassed motor vehicle accidents as the leading cause of death for individuals between the ages of 57 and 51.  Clinical data suggest that neuroendocrine dysfunction may be very common in both men and women, potentially causing hypogonadism, erectile dysfunction, infertility,  decreased libido, osteoporosis, and depression. Recent studies linked higher opioid dose to increased opioid-related mortality. Controlled observational studies reported that long-term opioid therapy may be associated with increased risk for cardiovascular events.  Subsequent meta-analysis concluded that the safety of long-term opioid therapy in elderly patients has not been proven.   Side Effects and adverse reactions: Common side effects: Drowsiness (sedation). Dizziness. Nausea and vomiting. Constipation. Physical dependence -- Dependence often manifests with withdrawal symptoms when opioids are discontinued or decreased. Tolerance -- As you take repeated doses of opioids, you require increased medication to experience the same effect of pain relief. Respiratory depression -- This can occur in healthy people, especially with higher doses. However, people with COPD, asthma or other lung conditions may be even more susceptible to fatal respiratory impairment.  Uncommon side effects: An increased sensitivity to feeling pain and extreme response to pain (hyperalgesia). Chronic use of opioids can lead to this. Delayed gastric emptying (the process by which the contents of your stomach are moved into your small intestine). Muscle rigidity. Immune system and hormonal dysfunction. Quick, involuntary muscle jerks (myoclonus). Arrhythmia. Itchy skin (pruritus). Dry mouth (xerostomia).  Long-term side effects: Chronic constipation. Sleep-disordered breathing (SDB). Increased risk of bone fractures. Hypothalamic-pituitary-adrenal dysregulation. Increased risk of overdose.  RISKS: Respiratory depression and death: Opioids increase the risk of respiratory depression and death.  Drug-to-drug interactions: Opioids are relatively contraindicated in combination with benzodiazepines, sleep inducers, and other central nervous system depressants. Other classes of medications (i.e.: certain antibiotics  and even over-the-counter medications) may also trigger or induce respiratory depression in some patients.  Medical conditions: Patients with pre-existing respiratory problems are at higher risk of respiratory failure and/or depression when in combination with opioid analgesics. Opioids are relatively contraindicated in some medical conditions such as central sleep apnea.   Fractures and Falls:  Opioids increase the risk and incidence of falls. This is of particular importance in elderly patients.  Endocrine System:  Long-term administration is associated with endocrine abnormalities (endocrinopathies). (Also known as Opioid-induced Endocrinopathy) Influences on both the hypothalamic-pituitary-adrenal axis?and the hypothalamic-pituitary-gonadal axis have been demonstrated with consequent hypogonadism and adrenal insufficiency in both sexes. Hypogonadism and decreased levels of dehydroepiandrosterone sulfate have been reported in men and women. Endocrine effects include: Amenorrhoea in women (abnormal absence of menstruation) Reduced libido in both sexes Decreased sexual function Erectile dysfunction in men Hypogonadisms (decreased testicular function with shrinkage of testicles) Infertility Depression and fatigue Loss of muscle mass Anxiety Depression Immune suppression Hyperalgesia Weight gain Anemia Osteoporosis Patients (particularly women of childbearing age) should avoid opioids. There is insufficient evidence to recommend routine monitoring of asymptomatic patients taking opioids in the long-term for hormonal deficiencies.  Immune System: Human studies have demonstrated that opioids have an immunomodulating effect. These effects are mediated via opioid receptors both on immune effector cells and in the central nervous system. Opioids have been demonstrated to have adverse effects on antimicrobial response and anti-tumour surveillance. Buprenorphine has been demonstrated to have  no impact on immune function.  Opioid Induced Hyperalgesia: Human studies have demonstrated that prolonged use of opioids can lead to a state of abnormal pain sensitivity, sometimes called opioid induced hyperalgesia (OIH). Opioid induced hyperalgesia is not usually seen in the absence of tolerance to opioid analgesia. Clinically, hyperalgesia may be diagnosed if the patient on long-term opioid therapy presents with increased pain. This might be qualitatively and anatomically distinct from pain related to disease progression or to breakthrough pain resulting from development of opioid tolerance. Pain associated with hyperalgesia tends to be more diffuse than the pre-existing pain and less defined in quality. Management of opioid induced hyperalgesia requires opioid dose reduction.  Cancer: Chronic opioid therapy has been associated with an increased  risk of cancer among noncancer patients with chronic pain. This association was more evident in chronic strong opioid users. Chronic opioid consumption causes significant pathological changes in the small intestine and colon. Epidemiological studies have found that there is a link between opium  dependence and initiation of gastrointestinal cancers. Cancer is the second leading cause of death after cardiovascular disease. Chronic use of opioids can cause multiple conditions such as GERD, immunosuppression and renal damage as well as carcinogenic effects, which are associated with the incidence of cancers.   Mortality: Long-term opioid use has been associated with increased mortality among patients with chronic non-cancer pain (CNCP).  Prescription of long-acting opioids for chronic noncancer pain was associated with a significantly increased risk of all-cause mortality, including deaths from causes other than overdose.  Reference: Von Korff M, Kolodny A, Deyo RA, Chou R. Long-term opioid therapy reconsidered. Ann Intern Med. 2011 Sep 6;155(5):325-8. doi:  10.7326/[id number]. PMID: 78106373; PMCID: EFR6719914. Kit JINNY Laurence CINDERELLA Pearley JINNY, Hayward RA, Dunn KM, Jordan KP. Risk of adverse events in patients prescribed long-term opioids: A cohort study in the [place] Clinical Practice Research [employer]. Eur J Pain. 2019 May;23(5):908-922. doi: 10.1002/ejp.1357. Epub 2019 Jan 31. PMID: 69379883. Colameco S, Coren JS, Ciervo CA. Continuous opioid treatment for chronic noncancer pain: a time for moderation in prescribing. Postgrad Med. 2009 Jul;121(4):61-6. doi: 10.3810/pgm.2009.07.2032. PMID: 80358728. Gigi JONELLE Shlomo MILUS Levern IVER Conny RN, McKinney SD, Blazina I, Lonell DASEN, Bougatsos C, Deyo RA. The effectiveness and risks of long-term opioid therapy for chronic pain: a systematic review for a [employer] of [employer] Pathways to [employer]. Ann Intern Med. 2015 Feb 17;162(4):276-86. doi: 10.7326/M14-2559. PMID: 74418742. Rory CHRISTELLA [facility], [place] DM. NCHS Data Brief No. 22. [place]: Centers for Disease Control and Prevention; 2009. Sep, Increase in Fatal Poisonings Involving Opioid Analgesics in the [place] , 1999-2006. Song IA, Choi HR, Oh TK. Long-term opioid use and mortality in patients with chronic non-cancer pain: Ten-year follow-up study in [place] from 2010 through 2019. EClinicalMedicine. 2022 Jul 18;51:101558. doi: 10.1016/j.eclinm.2022.898441. PMID: 64124182; PMCID: EFR0695089. Huser, W., Schubert, T., Vogelmann, T. et al. All-cause mortality in patients with long-term opioid therapy compared with non-opioid analgesics for chronic non-cancer pain: a database study. BMC Med 18, 162 (2020). http://lester.info/ Rashidian H, Zendehdel K, Kamangar F, Malekzadeh R, Haghdoost AA. An Ecological Study of the Association between Opiate Use and Incidence of Cancers. Addict Health. 2016 Fall;8(4):252-260. PMID: 71180443; PMCID: EFR4445194.  Our Goal: Our goal is to control your pain with means other  than the use of opioid pain medications.  Our Recommendation: Talk to your physician about coming off of these medications. We can assist you with the tapering down and stopping these medicines. Based on the new information, even if you cannot completely stop the medication, a decrease in the dose may be associated with a lesser risk. Ask for other means of controlling the pain. Decrease or eliminate those factors that significantly contribute to your pain such as smoking, obesity, and a diet heavily tilted towards inflammatory nutrients.  Last Updated: 07/18/2022   ______________________________________________________________________       ______________________________________________________________________    Medication Rules  Purpose: To inform patients, and their family members, of our medication rules and regulations.  Applies to: All patients receiving prescriptions from our practice (written or electronic).  Pharmacy of record: This is the pharmacy where your electronic prescriptions will be sent. Make sure we have the correct one.  Electronic prescriptions: In compliance with the [place]  Strengthen Opioid Misuse  Prevention (STOP) Act of 2017 (Session Law 2017-74/H243), effective January 10, 2018, all controlled substances must be electronically prescribed. Written prescriptions, faxing, or calling prescriptions to a pharmacy will no longer be done.  Prescription refills: These will be provided only during in-person appointments. No medications will be renewed without a face-to-face evaluation with your provider. Applies to all prescriptions.  NOTE: The following applies primarily to controlled substances (Opioid* Pain Medications).   Type of encounter (visit): For patients receiving controlled substances, face-to-face visits are required. (Not an option and not up to the patient.)  Patient's Responsibilities: Pain Pills: Bring all pain pills to every appointment  (except for procedure appointments). Pill counts are required.  Pill Bottles: Bring pills in original pharmacy bottle. Bring bottle, even if empty. Always bring the bottle of the most recent fill.  Medication refills: You are responsible for knowing and keeping track of what medications you are taking and when is it that you will need a refill. The day before your appointment: write a list of all prescriptions that need to be refilled. The day of the appointment: give the list to the admitting nurse. Prescriptions will be written only during appointments. No prescriptions will be written on procedure days. If you forget a medication: it will not be Called in, Faxed, or electronically sent. You will need to get another appointment to get these prescribed. No early refills. Do not call asking to have your prescription filled early. Partial  or short prescriptions: Occasionally your pharmacy may not have enough pills to fill your prescription.  NEVER ACCEPT a partial fill or a prescription that is short of the total amount of pills that you were prescribed.  With controlled substances the law allows 72 hours for the pharmacy to complete the prescription.  If the prescription is not completed within 72 hours, the pharmacist will require a new prescription to be written. This means that you will be short on your medicine and we WILL NOT send another prescription to complete your original prescription.  Instead, request the pharmacy to send a carrier to a nearby branch to get enough medication to provide you with your full prescription. Prescription Accuracy: You are responsible for carefully inspecting your prescriptions before leaving our office. Have the discharge nurse carefully go over each prescription with you, before taking them home. Make sure that your name is accurately spelled, that your address is correct. Check the name and dose of your medication to make sure it is accurate. Check the number  of pills, and the written instructions to make sure they are clear and accurate. Make sure that you are given enough medication to last until your next medication refill appointment. Taking Medication: Take medication as prescribed. When it comes to controlled substances, taking less pills or less frequently than prescribed is permitted and encouraged. Never take more pills than instructed. Never take the medication more frequently than prescribed.  Inform other Doctors: Always inform, all of your healthcare providers, of all the medications you take. Pain Medication from other Providers: You are not allowed to accept any additional pain medication from any other Doctor or Healthcare provider. There are two exceptions to this rule. (see below) In the event that you require additional pain medication, you are responsible for notifying us , as stated below. Cough Medicine: Often these contain an opioid, such as codeine or hydrocodone . Never accept or take cough medicine containing these opioids if you are already taking an opioid* medication. The combination may cause respiratory failure and  death. Medication Agreement: You are responsible for carefully reading and following our Medication Agreement. This must be signed before receiving any prescriptions from our practice. Safely store a copy of your signed Agreement. Violations to the Agreement will result in no further prescriptions. (Additional copies of our Medication Agreement are available upon request.) Laws, Rules, & Regulations: All patients are expected to follow all [address] and [employer], Rules, [employer]. Ignorance of the Laws does not constitute a valid excuse.  Illegal drugs and Controlled Substances: The use of illegal substances (including, but not limited to marijuana and its derivatives) and/or the illegal use of any controlled substances is strictly prohibited. Violation of this rule may result in the immediate and permanent  discontinuation of any and all prescriptions being written by our practice. The use of any illegal substances is prohibited. Adopted CDC guidelines & recommendations: Target dosing levels will be at or below 60 MME/day. Use of benzodiazepines** is not recommended. Urine Drug testing: Patients taking controlled substances will be required to provide a urine sample upon request. Do not void before coming to your medication management appointments. Hold emptying your bladder until you are admitted. The admitting nurse will inform you if a sample is required. Our practice reserves the right to call you at any time to provide a sample. Once receiving the call, you have 24 hours to comply with request. Not providing a sample upon request may result in termination of medication therapy.  Exceptions: There are only two exceptions to the rule of not receiving pain medications from other Healthcare Providers. Exception #1 (Emergencies): In the event of an emergency (i.e.: accident requiring emergency care), you are allowed to receive additional pain medication. However, you are responsible for: As soon as you are able, call our office [phone], at any time of the day or night, and leave a message stating your name, the date and nature of the emergency, and the name and dose of the medication prescribed. In the event that your call is answered by a member of our staff, make sure to document and save the date, time, and the name of the person that took your information.  Exception #2 (Planned Surgery): In the event that you are scheduled by another doctor or dentist to have any type of surgery or procedure, you are allowed (for a period no longer than 30 days), to receive additional pain medication, for the acute post-op pain. However, in this case, you are responsible for picking up a copy of our Post-op Pain Management for Surgeons handout, and giving it to your surgeon or dentist. This document is available at  our office, and does not require an appointment to obtain it. Simply go to our office during business hours (Monday-Thursday from 8:00 AM to 4:00 PM) (Friday 8:00 AM to 12:00 Noon) or if you have a scheduled appointment with us , prior to your surgery, and ask for it by name. In addition, you are responsible for: calling our office [phone], at any time of the day or night, and leaving a message stating your name, name of your surgeon, type of surgery, and date of procedure or surgery. Failure to comply with your responsibilities may result in termination of therapy involving the controlled substances.  Consequences:  Non-compliance with the above rules may result in permanent discontinuation of medication prescription therapy. All patients receiving any type of controlled substance is expected to comply with the above patient responsibilities. Not doing so may result in permanent discontinuation  of medication prescription therapy. Medication Agreement Violation. Following the above rules, including your responsibilities will help you in avoiding a Medication Agreement Violation (Breaking your Pain Medication Contract).  *Opioid medications include: morphine , codeine, oxycodone , oxymorphone, hydrocodone , hydromorphone , meperidine, tramadol, tapentadol, buprenorphine, fentanyl , methadone. **Benzodiazepine medications include: diazepam (Valium), alprazolam (Xanax), clonazepam (Klonopine), lorazepam (Ativan), clorazepate (Tranxene), chlordiazepoxide (Librium), estazolam (Prosom), oxazepam (Serax), temazepam  (Restoril ), triazolam (Halcion) (Last updated: 11/02/2022) ______________________________________________________________________     ______________________________________________________________________    Medication Recommendations and Reminders  Applies to: All patients receiving prescriptions (written and/or electronic).  Medication Rules & Regulations: You are responsible for reading,  knowing, and following our Medication Rules document. These exist for your safety and that of others. They are not flexible and neither are we. Dismissing or ignoring them is an act of non-compliance that may result in complete and irreversible termination of such medication therapy. For safety reasons, non-compliance will not be tolerated. As with the [place] fundamental legal principle of ignorance of the law is no defense, we will accept no excuses for not having read and knowing the content of documents provided to you by our practice.  Pharmacy of record:  Definition: This is the pharmacy where your electronic prescriptions will be sent.  We do not endorse any particular pharmacy. It is up to you and your insurance to decide what pharmacy to use.  We do not restrict you in your choice of pharmacy. However, once we write for your prescriptions, we will NOT be re-sending more prescriptions to fix restricted supply problems created by your pharmacy, or your insurance.  The pharmacy listed in the electronic medical record should be the one where you want electronic prescriptions to be sent. If you choose to change pharmacy, simply notify our nursing staff. Changes will be made only during your regular appointments and not over the phone.  Recommendations: Keep all of your pain medications in a safe place, under lock and key, even if you live alone. We will NOT replace lost, stolen, or damaged medication. We do not accept Police Reports as proof of medications having been stolen. After you fill your prescription, take 1 week's worth of pills and put them away in a safe place. You should keep a separate, properly labeled bottle for this purpose. The remainder should be kept in the original bottle. Use this as your primary supply, until it runs out. Once it's gone, then you know that you have 1 week's worth of medicine, and it is time to come in for a prescription refill. If you do this correctly, it  is unlikely that you will ever run out of medicine. To make sure that the above recommendation works, it is very important that you make sure your medication refill appointments are scheduled at least 1 week before you run out of medicine. To do this in an effective manner, make sure that you do not leave the office without scheduling your next medication management appointment. Always ask the nursing staff to show you in your prescription , when your medication will be running out. Then arrange for the receptionist to get you a return appointment, at least 7 days before you run out of medicine. Do not wait until you have 1 or 2 pills left, to come in. This is very poor planning and does not take into consideration that we may need to cancel appointments due to bad weather, sickness, or emergencies affecting our staff. DO NOT ACCEPT A Partial Fill: If for any reason your pharmacy does not  have enough pills/tablets to completely fill or refill your prescription, do not allow for a partial fill. The law allows the pharmacy to complete that prescription within 72 hours, without requiring a new prescription. If they do not fill the rest of your prescription within those 72 hours, you will need a separate prescription to fill the remaining amount, which we will NOT provide. If the reason for the partial fill is your insurance, you will need to talk to the pharmacist about payment alternatives for the remaining tablets, but again, DO NOT ACCEPT A PARTIAL FILL, unless you can trust your pharmacist to obtain the remainder of the pills within 72 hours.  Prescription refills and/or changes in medication(s):  Prescription refills, and/or changes in dose or medication, will be conducted only during scheduled medication management appointments. (Applies to both, written and electronic prescriptions.) No refills on procedure days. No medication will be changed or started on procedure days. No changes, adjustments,  and/or refills will be conducted on a procedure day. Doing so will interfere with the diagnostic portion of the procedure. No phone refills. No medications will be called into the pharmacy. No Fax refills. No weekend refills. No Holliday refills. No after hours refills.  Remember:  Business hours are:  Monday to Thursday 8:00 AM to 4:00 PM Provider's Schedule: Eric Como, MD - Appointments are:  Medication management: Monday and Wednesday 8:00 AM to 4:00 PM Procedure day: Tuesday and Thursday 7:30 AM to 4:00 PM Wallie Sherry, MD - Appointments are:  Medication management: Tuesday and Thursday 8:00 AM to 4:00 PM Procedure day: Monday and Wednesday 7:30 AM to 4:00 PM (Last update: 11/02/2021) ______________________________________________________________________     ______________________________________________________________________    National Pain Medication Shortage  The [place] is experiencing worsening drug shortages. These have had a negative widespread effect on patient care and treatment. Not expected to improve any time soon. Predicted to last past 2029.   Drug shortage list (generic names) Oxycodone  IR Oxycodone /APAP Oxymorphone IR Hydromorphone  Hydrocodone /APAP Morphine   Where is the problem?  Manufacturing and supply level.  Will this shortage affect you?  Only if you take any of the above pain medications.  How? You may be unable to fill your prescription.  Your pharmacist may offer a partial fill of your prescription. (Warning: Do not accept partial fills.) Prescriptions partially filled cannot be transferred to another pharmacy. Read our Medication Rules and Regulation. Depending on how much medicine you are dependent on, you may experience withdrawals when unable to get the medication.  Recommendations: Consider ending your dependence on opioid pain medications. Ask your pain specialist to assist you with the process. Consider switching to a  medication currently not in shortage, such as Buprenorphine. Talk to your pain specialist about this option. Consider decreasing your pain medication requirements by managing tolerance thru Drug Holidays. This may help minimize withdrawals, should you run out of medicine. Control your pain thru the use of non-pharmacological interventional therapies.   Your prescriber: Prescribers cannot be blamed for shortages. Medication manufacturing and supply issues cannot be fixed by the prescriber.   NOTE: The prescriber is not responsible for supplying the medication, or solving supply issues. Work with your pharmacist to solve it. The patient is responsible for the decision to take or continue taking the medication and for identifying and securing a legal supply source. By law, supplying the medication is the job and responsibility of the pharmacy. The prescriber is responsible for the evaluation, monitoring, and prescribing of these medications.   Prescribers  will NOT: Re-issue prescriptions that have been partially filled. Re-issue prescriptions already sent to a pharmacy.  Re-send prescriptions to a different pharmacy because yours did not have your medication. Ask pharmacist to order more medicine or transfer the prescription to another pharmacy. (Read below.)  New 2023 regulation: September 10, 2021 Revised Regulation Allows DEA-Registered Pharmacies to Transfer Electronic Prescriptions at a Patients Request DEA Headquarters Division - Public Information Office Patients now have the ability to request their electronic prescription be transferred to another pharmacy without having to go back to their practitioner to initiate the request. This revised regulation went into effect on Monday, September 06, 2021.     At a patients request, a DEA-registered retail pharmacy can now transfer an electronic prescription for a controlled substance (schedules II-V) to another DEA-registered retail pharmacy.  Prior to this change, patients would have to go through their practitioner to cancel their prescription and have it re-issued to a different pharmacy. The process was taxing and time consuming for both patients and practitioners.    The Drug Enforcement Administration [facility]) published its intent to revise the process for transferring electronic prescriptions on November 29, 2019.  The final rule was published in the federal register on August 05, 2021 and went into effect 30 days later.  Under the final rule, a prescription can only be transferred once between pharmacies, and only if allowed under existing state or other applicable law. The prescription must remain in its electronic form; may not be altered in any way; and the transfer must be communicated directly between two licensed pharmacists. Its important to note, any authorized refills transfer with the original prescription, which means the entire prescription will be filled at the same pharmacy.  Reference: hugehand.is [facility] website announcement)  Cheapwipes.at.pdf [employer])   [employer] / Vol. 88, No. 143 / Thursday, August 05, 2021 / Rules and Regulations DEPARTMENT OF JUSTICE  Drug Enforcement Administration  21 CFR Part 1306  [Docket No. [id number]]  RIN [id number] Transfer of Electronic Prescriptions for Schedules II-V Controlled Substances Between Pharmacies for Initial Filling  ______________________________________________________________________       ______________________________________________________________________    Transfer of Pain Medication between Pharmacies  Re: 2023 DEA Clarification on existing regulation  Published on DEA Website: September 10, 2021  Title: Revised Regulation Allows DEA-Registered Pharmacies to Herbalist Prescriptions at a Patients Request DEA Headquarters Division - [employer]  Patients now have the ability to request their electronic prescription be transferred to another pharmacy without having to go back to their practitioner to initiate the request. This revised regulation went into effect on Monday, September 06, 2021.     At a patients request, a DEA-registered retail pharmacy can now transfer an electronic prescription for a controlled substance (schedules II-V) to another DEA-registered retail pharmacy. Prior to this change, patients would have to go through their practitioner to cancel their prescription and have it re-issued to a different pharmacy. The process was taxing and time consuming for both patients and practitioners.    The Drug Enforcement Administration [facility]) published its intent to revise the process for transferring electronic prescriptions on November 29, 2019.  The final rule was published in the federal register on August 05, 2021 and went into effect 30 days later.  Under the final rule, a prescription can only be transferred once between pharmacies, and only if allowed under existing state or other applicable law. The prescription must remain in its electronic form; may not  be altered in any way; and the transfer must be communicated directly between two licensed pharmacists. Its important to note, any authorized refills transfer with the original prescription, which means the entire prescription will be filled at the same pharmacy.    REFERENCES: 1. DEA website announcement hugehand.is  2. Department of Justice website  Cheapwipes.at.pdf  3. DEPARTMENT OF JUSTICE Drug Enforcement Administration 21 CFR Part 1306 [Docket No. [id number]] RIN [id number]-AB64 Transfer of Electronic Prescriptions for  Schedules II-V Controlled Substances Between Pharmacies for Initial Filling  ______________________________________________________________________       ______________________________________________________________________    Medication Transfer   Notification You are currently compliant and stable on your pain medication regimen. This regimen will be transferred today to your Primary Care Provider (PCP). You will be provided with enough prescriptions to last for 90 days. After that, your prescriptions will need to be taken over by your PCP.  Recommendation Immediately contact your primary care provider to secure an appointment for evaluation before this period is over. Do not wait until the last month to contact them.   Clarification The transfer of your medication regimen does not mean that you are being discharged from our clinic. We will remain available to you for any consultation or interventional therapies you may need.   Alternative Should you decide not to continue taking these medication and would like assistance in permanently stopping them, please let us  know so that we can design a slow tapering down of your regimen.  Reason Our primary responsibility to provide specialized interventional pain management therapies otherwise not available to the community. We have in the past assisted primary care providers with reviewing and adjusting pain medication management therapies, however, we have been transparent to all patients and referring providers that it is not our intention to permanently take over this type of therapy. Transfer of this portion of your care will assist us  in freeing time to assist others in need of our specialty services.   ______________________________________________________________________      ______________________________________________________________________    WARNING: CBD (cannabidiol) & Delta (Delta-8 tetrahydrocannabinol) products.    Applicable to:  All individuals currently taking or considering taking CBD (cannabidiol) and, more important, all patients taking opioid analgesic controlled substances (pain medication). (Example: oxycodone ; oxymorphone; hydrocodone ; hydromorphone ; morphine ; methadone; tramadol; tapentadol; fentanyl ; buprenorphine; butorphanol; dextromethorphan; meperidine; codeine; etc.)  Introduction:  Recently there has been a drive towards the use of natural products for the treatment of different conditions, including pain anxiety and sleep disorders. Marijuana and hemp are two varieties of the cannabis genus plants. Marijuana and its derivatives are illegal, while hemp and its derivatives are not. Cannabidiol (CBD) and tetrahydrocannabinol (THC), are two natural compounds found in plants of the Cannabis genus. They can both be extracted from hemp or marijuana. Both compounds interact with your bodys endocannabinoid system in very different ways. CBD is associated with pain relief (analgesia) while THC is associated with the psychoactive effects (the high) obtained from the use of marijuana products. There are two main types of THC: Delta-9, which comes from the marijuana plant and it is illegal, and Delta-8, which comes from the hemp plant, and it is legal. (Both, Delta-9-THC and Delta-8-THC are psychoactive and give you the high.)   Legality:  Marijuana and its derivatives: illegal Hemp and its derivatives: Legal (State dependent) UPDATE: (02/26/2021) The Drug Enforcement Agency (DEA) issued a letter stating that delta cannabinoids, including Delta-8-THCO and Delta-9-THCO, synthetically derived from hemp do not qualify as hemp and will be viewed as  Schedule I drugs. (Schedule I drugs, substances, or chemicals are defined as drugs with no currently accepted medical use and a high potential for abuse. Some examples of Schedule I drugs are: heroin, lysergic acid diethylamide (LSD), marijuana (cannabis),  3,4-methylenedioxymethamphetamine (ecstasy), methaqualone, and peyote.) (cuetune.com.ee)  Legal status of CBD in :  Conditionally Legal  Reference: FDA Regulation of Cannabis and Cannabis-Derived Products, Including Cannabidiol (CBD) - oemdeals.dk  Warning:  CBD is not FDA approved and has not undergo the same manufacturing controls as prescription drugs.  This means that the purity and safety of available CBD may be questionable. Most of the time, despite manufacturer's claims, it is contaminated with THC (delta-9-tetrahydrocannabinol - the chemical in marijuana responsible for the HIGH).  When this is the case, the [facility] contaminant will trigger a positive urine drug screen (UDS) test for Marijuana (carboxy-THC).   The FDA recently put out a warning about 5 things that everyone should be aware of regarding Delta-8 THC: Delta-8 THC products have not been evaluated or approved by the FDA for safe use and may be marketed in ways that put the public health at risk. The FDA has received adverse event reports involving delta-8 THC-containing products. Delta-8 THC has psychoactive and intoxicating effects. Delta-8 THC manufacturing often involve use of potentially harmful chemicals to create the concentrations of delta-8 THC claimed in the marketplace. The final delta-8 THC product may have potentially harmful by-products (contaminants) due to the chemicals used in the process. Manufacturing of delta-8 THC products may occur in uncontrolled or unsanitary settings, which may lead to the presence of unsafe contaminants or other potentially harmful substances. Delta-8 THC products should be kept out of the reach of children and pets.  NOTE: Because a positive UDS for any illicit substance is a violation of our medication agreement, your opioid analgesics (pain medicine) may be  permanently discontinued.  MORE ABOUT CBD  General Information: CBD was discovered in 60 and it is a derivative of the cannabis sativa genus plants (Marijuana and Hemp). It is one of the 113 identified substances found in Marijuana. It accounts for up to 40% of the plant's extract. As of 2018, preliminary clinical studies on CBD included research for the treatment of anxiety, movement disorders, and pain. CBD is available and consumed in multiple forms, including inhalation of smoke or vapor, as an aerosol spray, and by mouth. It may be supplied as an oil containing CBD, capsules, dried cannabis, or as a liquid solution. CBD is thought not to be as psychoactive as THC (delta-9-tetrahydrocannabinol - the chemical in marijuana responsible for the HIGH). Studies suggest that CBD may interact with different biological target receptors in the body, including cannabinoid and other neurotransmitter receptors. As of 2018 the mechanism of action for its biological effects has not been determined.  Side-effects  Adverse reactions: Dry mouth, diarrhea, decreased appetite, fatigue, drowsiness, malaise, weakness, sleep disturbances, and others.  Drug interactions:  CBD may interact with medications such as blood-thinners. CBD causes drowsiness on its own and it will increase drowsiness caused by other medications, including antihistamines (such as Benadryl), benzodiazepines (Xanax, Ativan, Valium), antipsychotics, antidepressants, opioids, alcohol and supplements such as kava, melatonin and St. John's Wort.  Other drug interactions: Brivaracetam (Briviact); Caffeine; Carbamazepine (Tegretol); Citalopram (Celexa); Clobazam (Onfi); Eslicarbazepine (Aptiom); Everolimus (Zostress); Lithium; Methadone (Dolophine); Rufinamide (Banzel); Sedative medications (CNS depressants); Sirolimus (Rapamune); Stiripentol (Diacomit); Tacrolimus (Prograf); Tamoxifen ; Soltamox); Topiramate (Topamax); Valproate; Warfarin (Coumadin);  Zonisamide. (Last update: 12/20/2021) ______________________________________________________________________     ______________________________________________________________________  Muscle Spasms & Cramps  Cause(s):  Most common - vitamin and/or electrolyte (calcium , potassium, sodium, etc.) deficiencies. Post procedure - steroids (injected, oral, or inhaled) can make your kidneys excrete (loose) electrolytes. Most of the time this will not cause any symptoms however, if you happen to be borderline low on your electrolytes, it may temporarily triggering cramps & spasms.  Possible triggers: Sweating - causes loss of electrolytes thru the skin. Steroids - causes loss of electrolytes thru the urine.  Treatment: (over-the-counter)  Gatorade (or any other electrolyte-replenishing drink) - Take 1, 8 oz glass with each meal (3 times a day). Mechanism of action: Replenishes lost electrolytes. Magnesium 400 to 500 mg - Take 1 tablet twice a day (one with breakfast and one at bedtime). If you have kidney disease talk to your primary care physician before taking any Magnesium. Mechanism of action: Magnesium is a natural muscle relaxant. Tonic Water with quinine - Take 1, 8 oz glass before bedtime.  Mechanism of action: Quinine is used to treat spasms.  Last Update: 07/21/2022  ______________________________________________________________________     ______________________________________________________________________    Appointment Information  It is our goal and responsibility to provide the medical community with assistance in the evaluation and management of patients with chronic pain. Unfortunately our resources are limited. Because we do not have an unlimited amount of time, or available appointments, we are required to closely monitor for unkept or cancelled appointments.  Patient's responsibilities: 1. Punctuality: Patients are required to be physically present in our office at  least 15 minutes before their scheduled appointment. 2. Tardiness: Patients not physically present in our office at their scheduled appointment time will be rescheduled. 3. Plan ahead: Assume that you will encounter traffic and plan to arrive 30 minutes before your appointment. 4. Other appointments and responsibilities: Do not schedule other appointments immediately before or after your scheduled appointment.  5. Be prepared: Make a list of everything that you need to discuss with your provider so that you use your time efficiently. Once the provider leaves your room, he/she will not return to your room to discuss anything that you neglected to bring up during your allowed time. 6. No children or pets: Do not bring children or pets to your appointment. 7. Cancelling or rescheduling your appointment: Advanced notification (more than 24 hours in advance) is required. 8. No Show: Not calling to cancel an appointment and simply not showing up is unacceptable. This leads to loss of appointments that could have been used by a patient in need. (See below)  Corrective process for repeat offenders:  No Shows: Three (3) No Shows within a 12 month period will result in an automatic discharge from our practice. Rescheduling or cancelling with more than 24 hours notice will not be penalized and will not count against you. Tardiness: If you have to be rescheduled three (3) times due to late arrivals, it will be counted as one (1) No Show. Cancellation or reschedule: Three (3) cancellations or rescheduling where notice was given with less than 24 hours in advance, will be recorded as one (1) No Show.  Types of appointments: New patient initial evaluation: These are evaluations only. Your initial patient questionnaire will be collected and entered into the system. A history of present illness will be taken. Prior lab work, imaging studies, and associated treatments will be reviewed. The provider may order  appropriate diagnostic testing depending on their evaluation and review of available information. No treatments will be started on this visit. 2nd  Follow-up visit: During this visit your provider will inform you of the results of the diagnostic tests ordered on the initial evaluation. Based on the providers assessment, treatment options will be offered, at which the patient will decide if he/she is interested in the alternatives. If interested, a treatment plan will be established and started. Procedure visits: Post-procedure evaluation visits: Evaluation visits MM New problems Flare-up evaluations Follow-up after diagnostic testing ______________________________________________________________________     "

## 2024-01-25 NOTE — Progress Notes (Signed)
 Nursing Pain Medication Assessment:  Safety precautions to be maintained throughout the outpatient stay will include: orient to surroundings, keep bed in low position, maintain call bell within reach at all times, provide assistance with transfer out of bed and ambulation.  Medication Inspection Compliance: Pill count conducted under aseptic conditions, in front of the patient. Neither the pills nor the bottle was removed from the patient's sight at any time. Once count was completed pills were immediately returned to the patient in their original bottle.  Medication: Oxycodone  IR Pill/Patch Count: 75 of 120 pills/patches remain Pill/Patch Appearance: Markings consistent with prescribed medication Bottle Appearance: Standard pharmacy container. Clearly labeled. Filled Date: 68 / 21 / 2025 Last Medication intake:  Yesterday

## 2024-02-01 ENCOUNTER — Other Ambulatory Visit: Payer: Self-pay | Admitting: Neurosurgery

## 2024-02-01 LAB — DRUG SCREEN 10 W/CONF, SERUM
Amphetamines, IA: NEGATIVE ng/mL
Barbiturates, IA: NEGATIVE ug/mL
Benzodiazepines, IA: NEGATIVE ng/mL
Cocaine & Metabolite, IA: NEGATIVE ng/mL
Methadone, IA: NEGATIVE ng/mL
Opiates, IA: NEGATIVE ng/mL
Oxycodones, IA: POSITIVE ng/mL — AB
Phencyclidine, IA: NEGATIVE ng/mL
Propoxyphene, IA: NEGATIVE ng/mL
THC(Marijuana) Metabolite, IA: NEGATIVE ng/mL

## 2024-02-01 LAB — OXYCODONES,MS,WB/SP RFX
Oxycocone: 26.7 ng/mL
Oxycodones Confirmation: POSITIVE
Oxymorphone: NEGATIVE ng/mL

## 2024-02-13 ENCOUNTER — Inpatient Hospital Stay (HOSPITAL_COMMUNITY): Admission: RE | Admit: 2024-02-13 | Payer: PRIVATE HEALTH INSURANCE | Source: Ambulatory Visit

## 2024-02-14 ENCOUNTER — Other Ambulatory Visit: Payer: Self-pay | Admitting: Neurosurgery

## 2024-02-16 ENCOUNTER — Encounter (HOSPITAL_COMMUNITY): Payer: Self-pay | Admitting: Neurosurgery

## 2024-02-16 ENCOUNTER — Other Ambulatory Visit: Payer: Self-pay

## 2024-02-16 NOTE — Progress Notes (Signed)
 SDW CALL  Patient's daughter,Liz Owens,was given pre-op instructions over the phone. The opportunity was given for Ms. Quin to ask questions. No further questions asked. Ms. Quin verbalized understanding of instructions given.   PCP - [name], MD  Cardiologist - Annalee Custovic,MD LOV 11/14/22  PPM/ICD - denies Device Orders -  Rep Notified -   Chest x-ray - na EKG - 12/12/23 Stress Test - Nuclear medicine-11/08/22 CE ECHO - 10/09/18 Cardiac Cath - 11/24/22  Sleep Study - yes-+OSA CPAP - does not use CPAP  Fasting Blood Sugar - Patient's daughter states that her mother does not check her blood sugar and does not have a meter. She her mother's blood sugar previously was diet controlled. Pt's last A1C was 6.4 on 10/24/23. Checks Blood Sugar _____ times a day  Blood Thinner Instructions:na Aspirin  Instructions:patient's daughter reports patient last took aspirin  on 02/11/24.  ERAS Protcol -NPO PRE-SURGERY Ensure or G2-   COVID TEST- na   Anesthesia review: yes-DM,CAD,CHF,HTN. Per daughter, a previous back surgery was canceled because patient's BP dropped when central line insertion was attempted after unsuccessful peripheral IV access. Patient's daughter said her mother frequently has issues with IV access and that at one point a doctor recommended the patient get a port for the remacaide infusions, but patient and daughter declined.    Patient's daughter denies that patient has shortness of breath, fever, cough and chest pain over the phone call   Oral Hygiene is also important to reduce your risk of infection.  Remember - BRUSH YOUR TEETH THE MORNING OF SURGERY WITH YOUR REGULAR TOOTHPASTE

## 2024-02-16 NOTE — Anesthesia Preprocedure Evaluation (Signed)
 "                                  Anesthesia Evaluation    Airway        Dental   Pulmonary           Cardiovascular hypertension,      Neuro/Psych    GI/Hepatic   Endo/Other  diabetes    Renal/GU      Musculoskeletal   Abdominal   Peds  Hematology   Anesthesia Other Findings   Reproductive/Obstetrics                              Anesthesia Physical Anesthesia Plan  ASA:   Anesthesia Plan:    Post-op Pain Management:    Induction:   PONV Risk Score and Plan:   Airway Management Planned:   Additional Equipment:   Intra-op Plan:   Post-operative Plan:   Informed Consent:   Plan Discussed with:   Anesthesia Plan Comments: (PAT note by Lynwood Hope, PA-C: 79 year old female with pertinent history including HTN, paroxysmal A-fib (in the setting of acute infection, not on anticoagulation), rheumatoid arthritis on methotrexate  and Remicade, CKD 3, seizure (reportedly 1 episode related to Wellbutrin), difficult IV access, asthma, OSA on CPAP, diet-controlled DM2, hypothyroidism, GERD on PPI, chronic pain, cervical DDD 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  Patient had abnormal nuclear stress test 11/08/2022 which had been ordered by her PCP for risk stratification prior to undergoing proposed back surgery.  She was subsequently seen by cardiologist Dr. Dewane and ultimately underwent cath on 11/24/2022 showing mild to moderate nonobstructive CAD.  She was deemed low risk from cardiac standpoint to proceed with spinal surgery.  Patient presented for spine surgery on 02/13/2023 at [facility] with Dr. Katrina, however, procedure was aborted when she became hypotensive after induction and IV access was lost. Per anesthesia note, Patient lost vascular access shortly after induction and inserting the ETT. Multiple tries at obtaining vascular access were unsuccessful. Patient's blood pressure was low and we weren't able to provide any IV  medications. Patient then began to cough on the ETT due to lowering the volatile anesthetic to support her blood pressure. Decision was made to cancel the case and wake the patient up.  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  Followed up outpatient with ID and completed 8 weeks of antibiotic therapy.  History reviewed with anesthesiologist Dr. Dene. Advised ok to proceed as planned. Pt will be evaluated DOS by assigned anesthesiologist.   EKG 09/29/2023: Sinus rhythm with first-degree AV block with PACs.  Rate 82.  Cath 11/24/22:   Prox RCA lesion is 50% stenosed.   Mid RCA lesion is 25% stenosed.   The left ventricular systolic function is normal.   LV end diastolic pressure is normal.   The left ventricular ejection fraction is 55-65% by visual estimate.   There is no mitral valve regurgitation.   In the absence of any other complications or medical issues, we expect the patient to be ready for discharge from a cath perspective on 11/24/2022.   No indication for antiplatelet therapy at this time .   Conclusion   Outpatient left heart cath with coronaries possible PCI.   Left ventriculogram normal left ventricular function 55%   Coronaries Left main minor irregularities LAD large minor irregularities Circumflex minor irregularities RCA 50%  proximal 25% mid Right dominant   Intervention deferred Not indicated Have patient follow-up in clinic with cardiology 1 to 2 weeks    TTE 03/18/21: INTERPRETATION  NORMAL LEFT VENTRICULAR SYSTOLIC FUNCTION  NORMAL RIGHT VENTRICULAR SYSTOLIC FUNCTION  MILD VALVULAR REGURGITATION (See above)  NO VALVULAR STENOSIS    )         Anesthesia Quick Evaluation  "

## 2024-02-16 NOTE — Progress Notes (Signed)
 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  Patient had abnormal nuclear stress test 11/08/2022 which had been ordered by her PCP for risk stratification prior to undergoing proposed back surgery.  She was subsequently seen by cardiologist Dr. Dewane and ultimately underwent cath on 11/24/2022 showing mild to moderate nonobstructive CAD.  She was deemed low risk from cardiac standpoint to proceed with spinal surgery.  Patient presented for spine surgery on 02/13/2023 at [facility] with Dr. Katrina, however, procedure was aborted when she became hypotensive after induction and IV access was lost. Per anesthesia note, Patient lost vascular access shortly after induction and inserting the ETT. Multiple tries at obtaining vascular access were unsuccessful. Patient's blood pressure was low and we weren't able to provide any IV medications. Patient then began to cough on the ETT due to lowering the volatile anesthetic to support her blood pressure. Decision was made to cancel the case and wake the patient up.  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  Followed up outpatient with ID and completed 8 weeks of antibiotic therapy.  History reviewed with anesthesiologist Dr. Dene. Advised ok to proceed as planned. Pt will be evaluated DOS by assigned anesthesiologist.   EKG 09/29/2023: Sinus rhythm with first-degree AV block with PACs.  Rate 82.  Cath 11/24/22:   Prox RCA lesion is 50% stenosed.   Mid RCA lesion is 25% stenosed.   The left ventricular systolic function is normal.   LV end  diastolic pressure is normal.   The left ventricular ejection fraction is 55-65% by visual estimate.   There is no mitral valve regurgitation.   In the absence of any other complications or medical issues, we expect the patient to be ready for discharge from a cath perspective on 11/24/2022.   No indication for antiplatelet therapy at this time .   Conclusion   Outpatient left heart cath with coronaries possible PCI.   Left ventriculogram normal left ventricular function 55%   Coronaries Left main minor irregularities LAD large minor irregularities Circumflex minor irregularities RCA 50% proximal 25% mid Right dominant   Intervention deferred Not indicated Have patient follow-up in clinic with cardiology 1 to 2 weeks    TTE 03/18/21: INTERPRETATION  NORMAL LEFT VENTRICULAR SYSTOLIC FUNCTION  NORMAL RIGHT VENTRICULAR SYSTOLIC FUNCTION  MILD VALVULAR REGURGITATION (See above)  NO VALVULAR STENOSIS     Lynwood Geofm RIGGERS [facility] Short Stay Center/Anesthesiology Phone [phone] 02/16/2024 1:02 PM

## 2024-02-19 ENCOUNTER — Encounter (HOSPITAL_COMMUNITY): Payer: Self-pay | Admitting: Vascular Surgery

## 2024-02-19 ENCOUNTER — Ambulatory Visit (HOSPITAL_COMMUNITY): Admission: RE | Admit: 2024-02-19 | Payer: PRIVATE HEALTH INSURANCE | Admitting: Neurosurgery

## 2024-02-19 ENCOUNTER — Encounter (HOSPITAL_COMMUNITY): Admission: RE | Payer: Self-pay | Source: Home / Self Care

## 2024-04-17 ENCOUNTER — Encounter: Payer: PRIVATE HEALTH INSURANCE | Admitting: Nurse Practitioner
